# Patient Record
Sex: Female | Born: 1945
Health system: Southern US, Community
[De-identification: ages and names within clinical notes are randomized; demographics above are authoritative.]

## PROBLEM LIST (undated history)

## (undated) DIAGNOSIS — R7303 Prediabetes: Secondary | ICD-10-CM

## (undated) DIAGNOSIS — I10 Essential (primary) hypertension: Secondary | ICD-10-CM

## (undated) DIAGNOSIS — F329 Major depressive disorder, single episode, unspecified: Secondary | ICD-10-CM

## (undated) DIAGNOSIS — F32A Depression, unspecified: Secondary | ICD-10-CM

## (undated) DIAGNOSIS — F419 Anxiety disorder, unspecified: Secondary | ICD-10-CM

## (undated) DIAGNOSIS — C9000 Multiple myeloma not having achieved remission: Secondary | ICD-10-CM

## (undated) DIAGNOSIS — N189 Chronic kidney disease, unspecified: Secondary | ICD-10-CM

## (undated) HISTORY — DX: Multiple myeloma not having achieved remission: C90.00

## (undated) HISTORY — PX: APPENDECTOMY: SHX54

## (undated) HISTORY — PX: ABDOMINAL HYSTERECTOMY: SHX81

---

## 1998-11-07 ENCOUNTER — Other Ambulatory Visit: Admission: RE | Admit: 1998-11-07 | Discharge: 1998-11-07 | Payer: Self-pay | Admitting: *Deleted

## 1998-12-07 ENCOUNTER — Ambulatory Visit (HOSPITAL_COMMUNITY): Admission: RE | Admit: 1998-12-07 | Discharge: 1998-12-07 | Payer: Self-pay | Admitting: Gastroenterology

## 1999-01-23 ENCOUNTER — Inpatient Hospital Stay (HOSPITAL_COMMUNITY): Admission: RE | Admit: 1999-01-23 | Discharge: 1999-01-25 | Payer: Self-pay | Admitting: Obstetrics and Gynecology

## 1999-11-29 ENCOUNTER — Other Ambulatory Visit: Admission: RE | Admit: 1999-11-29 | Discharge: 1999-11-29 | Payer: Self-pay | Admitting: Obstetrics and Gynecology

## 2000-05-31 ENCOUNTER — Encounter: Payer: Self-pay | Admitting: Emergency Medicine

## 2000-05-31 ENCOUNTER — Emergency Department (HOSPITAL_COMMUNITY): Admission: EM | Admit: 2000-05-31 | Discharge: 2000-05-31 | Payer: Self-pay | Admitting: Emergency Medicine

## 2000-06-06 ENCOUNTER — Ambulatory Visit (HOSPITAL_BASED_OUTPATIENT_CLINIC_OR_DEPARTMENT_OTHER): Admission: RE | Admit: 2000-06-06 | Discharge: 2000-06-06 | Payer: Self-pay | Admitting: *Deleted

## 2001-02-04 ENCOUNTER — Other Ambulatory Visit: Admission: RE | Admit: 2001-02-04 | Discharge: 2001-02-04 | Payer: Self-pay | Admitting: Obstetrics and Gynecology

## 2002-11-09 ENCOUNTER — Other Ambulatory Visit: Admission: RE | Admit: 2002-11-09 | Discharge: 2002-11-09 | Payer: Self-pay | Admitting: Obstetrics and Gynecology

## 2003-01-25 ENCOUNTER — Encounter: Admission: RE | Admit: 2003-01-25 | Discharge: 2003-02-09 | Payer: Self-pay | Admitting: Orthopedic Surgery

## 2003-12-28 ENCOUNTER — Other Ambulatory Visit: Admission: RE | Admit: 2003-12-28 | Discharge: 2003-12-28 | Payer: Self-pay | Admitting: Obstetrics and Gynecology

## 2018-04-07 ENCOUNTER — Inpatient Hospital Stay (HOSPITAL_COMMUNITY): Payer: Medicaid Other

## 2018-04-07 ENCOUNTER — Ambulatory Visit (HOSPITAL_COMMUNITY)
Admission: RE | Admit: 2018-04-07 | Discharge: 2018-04-07 | Disposition: A | Discharge status: Home / Self Care | Payer: Medicaid Other | Source: Ambulatory Visit | Attending: Nurse Practitioner | Admitting: Nurse Practitioner

## 2018-04-07 ENCOUNTER — Encounter (HOSPITAL_COMMUNITY): Payer: Self-pay | Admitting: Hematology

## 2018-04-07 ENCOUNTER — Other Ambulatory Visit: Payer: Self-pay

## 2018-04-07 ENCOUNTER — Inpatient Hospital Stay (HOSPITAL_COMMUNITY): Payer: Medicaid Other | Attending: Hematology | Admitting: Hematology

## 2018-04-07 VITALS — Ht 63.0 in | Wt 167.0 lb

## 2018-04-07 DIAGNOSIS — Z5111 Encounter for antineoplastic chemotherapy: Secondary | ICD-10-CM | POA: Diagnosis present

## 2018-04-07 DIAGNOSIS — C9000 Multiple myeloma not having achieved remission: Secondary | ICD-10-CM

## 2018-04-07 DIAGNOSIS — N179 Acute kidney failure, unspecified: Secondary | ICD-10-CM | POA: Diagnosis not present

## 2018-04-07 DIAGNOSIS — D649 Anemia, unspecified: Secondary | ICD-10-CM | POA: Diagnosis not present

## 2018-04-07 DIAGNOSIS — M8580 Other specified disorders of bone density and structure, unspecified site: Secondary | ICD-10-CM | POA: Diagnosis not present

## 2018-04-07 DIAGNOSIS — Z5112 Encounter for antineoplastic immunotherapy: Secondary | ICD-10-CM | POA: Insufficient documentation

## 2018-04-07 DIAGNOSIS — R937 Abnormal findings on diagnostic imaging of other parts of musculoskeletal system: Secondary | ICD-10-CM | POA: Diagnosis not present

## 2018-04-07 LAB — CBC WITH DIFFERENTIAL/PLATELET
Basophils Absolute: 0 10*3/uL (ref 0.0–0.1)
Basophils Relative: 0 %
Eosinophils Absolute: 0.3 10*3/uL (ref 0.0–0.7)
Eosinophils Relative: 5 %
HCT: 28.2 % — ABNORMAL LOW (ref 36.0–46.0)
Hemoglobin: 9.3 g/dL — ABNORMAL LOW (ref 12.0–15.0)
Lymphocytes Relative: 28 %
Lymphs Abs: 1.9 10*3/uL (ref 0.7–4.0)
MCH: 30.3 pg (ref 26.0–34.0)
MCHC: 33 g/dL (ref 30.0–36.0)
MCV: 91.9 fL (ref 78.0–100.0)
Monocytes Absolute: 0.4 10*3/uL (ref 0.1–1.0)
Monocytes Relative: 6 %
Neutro Abs: 4.3 10*3/uL (ref 1.7–7.7)
Neutrophils Relative %: 61 %
Platelets: 202 10*3/uL (ref 150–400)
RBC: 3.07 MIL/uL — ABNORMAL LOW (ref 3.87–5.11)
RDW: 13.5 % (ref 11.5–15.5)
WBC: 7 10*3/uL (ref 4.0–10.5)

## 2018-04-07 LAB — COMPREHENSIVE METABOLIC PANEL
ALT: 16 U/L (ref 0–44)
AST: 18 U/L (ref 15–41)
Albumin: 3.5 g/dL (ref 3.5–5.0)
Alkaline Phosphatase: 74 U/L (ref 38–126)
Anion gap: 9 (ref 5–15)
BUN: 44 mg/dL — ABNORMAL HIGH (ref 8–23)
CO2: 23 mmol/L (ref 22–32)
Calcium: 12.7 mg/dL — ABNORMAL HIGH (ref 8.9–10.3)
Chloride: 103 mmol/L (ref 98–111)
Creatinine, Ser: 2.4 mg/dL — ABNORMAL HIGH (ref 0.44–1.00)
GFR calc Af Amer: 23 mL/min — ABNORMAL LOW (ref >60)
GFR calc non Af Amer: 19 mL/min — ABNORMAL LOW (ref >60)
Glucose, Bld: 128 mg/dL — ABNORMAL HIGH (ref 70–99)
Potassium: 3.9 mmol/L (ref 3.5–5.1)
Sodium: 135 mmol/L (ref 135–145)
Total Bilirubin: 0.5 mg/dL (ref 0.3–1.2)
Total Protein: 11.4 g/dL — ABNORMAL HIGH (ref 6.5–8.1)

## 2018-04-07 LAB — URIC ACID: Uric Acid, Serum: 7 mg/dL (ref 2.5–7.1)

## 2018-04-07 LAB — LACTATE DEHYDROGENASE: LDH: 115 U/L (ref 98–192)

## 2018-04-07 MED ORDER — ZOLPIDEM TARTRATE 10 MG PO TABS: 10.0000 mg | ORAL_TABLET | Freq: Every evening | ORAL | 0 refills | Status: DC | PRN
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
## 2018-04-07 NOTE — Patient Instructions (Signed)
[place] at [facility] Discharge Instructions Follow up in 1 week for results.   Thank you for choosing [place] at [facility] to provide your oncology and hematology care.  To afford each patient quality time with our provider, please arrive at least 15 minutes before your scheduled appointment time.   If you have a lab appointment with the [facility] please come in thru the  Main Entrance and check in at the main information desk  You need to re-schedule your appointment should you arrive 10 or more minutes late.  We strive to give you quality time with our providers, and arriving late affects you and other patients whose appointments are after yours.  Also, if you no show three or more times for appointments you may be dismissed from the clinic at the providers discretion.     Again, thank you for choosing [facility]  Our hope is that these requests will decrease the amount of time that you wait before being seen by our physicians.       _____________________________________________________________  Should you have questions after your visit to [facility], please contact our office at [phone] between the hours of 8:00 a.m. and 4:30 p.m.  Voicemails left after 4:00 p.m. will not be returned until the following business day.  For prescription refill requests, have your pharmacy contact our office and allow 72 hours.    Cancer Center Support Programs:   > Cancer Support Group  2nd Tuesday of the month 1pm-2pm, Journey Room

## 2018-04-07 NOTE — Assessment & Plan Note (Addendum)
1.  IgG lambda plasma cell myeloma: - Presentation to [facility] with left-sided rib pain on 12/29/2017, found to have hypercalcemia, anemia and acute kidney injury, further work-up showed 3.2 g/dL of IgG lambda monoclonal gammopathy. -Skeletal survey reportedly demonstrated diffuse osteopenia and multiple lytic lesions at bilateral lower extremities. -Bone marrow biopsy on 12/31/2017 showed hypercellular marrow with 40 to 50% cellularity, involved by plasma cell neoplasm (17% plasma cells by manual aspirate differential count, greater than 90% and 30 to 40% by CD138 immunohistochemical analysis of clot and core respectively, and monotypic lambda by flow cytometry analysis) - Chromosome analysis showed 12 of 20 cells examined are normal, 46, X6.  The remaining 8 metaphase cells analyzed are abnormal and represent a hyperdiploid clone containing 1 extra copy each of chromosome 3, 5, 6, 7, 9, 11, 15, 19, 20, 21, 22 as well as a marker chromosome. -Multiple myeloma FISH panel shows nuclei positive for three 1q signals, trisomy 11, and extra signals of chromosome 7, 9 and 15. - Patient was recommended to start chemotherapy with port placement by Dr. Federico Flake at [facility].  She was recommended to have IV chemotherapy as she only has Medicare part A. -Patient did not follow-up at [facility]. -She does not report any major new onset pains.  She developed right shoulder pain for the last 4 days.  She does have occasional nausea but denies any vomiting.  She lost about 10 pounds in the last 3 months.  Her only medications include ibuprofen taken for shoulder pain. -I have recommended doing labs including CBC, CMP, SPEP, S FLC, immunofixation, beta-2 microglobulin, LDH and skeletal survey.  I will see her back after that.  We plan to initiate treatment as soon as possible.  She has an appointment to see Dr. Arnoldo Morale this Thursday for port placement.  2.  Difficulty falling and staying asleep: -She had sleeping  problem for many years.  She is taking Tylenol PM which is not helping.  She has taken Xanax and Ambien in the past. -I have prescribed Ambien 10 mg to be taken as needed at bedtime.

## 2018-04-08 ENCOUNTER — Encounter (HOSPITAL_COMMUNITY): Payer: Self-pay | Admitting: General Practice

## 2018-04-08 LAB — PROTEIN ELECTROPHORESIS, SERUM
A/G Ratio: 0.5 — ABNORMAL LOW (ref 0.7–1.7)
Albumin ELP: 3.8 g/dL (ref 2.9–4.4)
Alpha-1-Globulin: 0.3 g/dL (ref 0.0–0.4)
Alpha-2-Globulin: 1 g/dL (ref 0.4–1.0)
Beta Globulin: 1.5 g/dL — ABNORMAL HIGH (ref 0.7–1.3)
Gamma Globulin: 4.5 g/dL — ABNORMAL HIGH (ref 0.4–1.8)
Globulin, Total: 7.3 g/dL — ABNORMAL HIGH (ref 2.2–3.9)
M-Spike, %: 4 g/dL — ABNORMAL HIGH
Total Protein ELP: 11.1 g/dL — ABNORMAL HIGH (ref 6.0–8.5)

## 2018-04-08 LAB — KAPPA/LAMBDA LIGHT CHAINS
Kappa free light chain: 24.1 mg/L — ABNORMAL HIGH (ref 3.3–19.4)
Kappa, lambda light chain ratio: 0 — ABNORMAL LOW (ref 0.26–1.65)
Lambda free light chains: 5966.7 mg/L — ABNORMAL HIGH (ref 5.7–26.3)

## 2018-04-08 LAB — IMMUNOFIXATION ELECTROPHORESIS
IgA: 46 mg/dL — ABNORMAL LOW (ref 87–352)
IgG (Immunoglobin G), Serum: 6141 mg/dL — ABNORMAL HIGH (ref 700–1600)
IgM (Immunoglobulin M), Srm: 13 mg/dL — ABNORMAL LOW (ref 26–217)
Total Protein ELP: 11.1 g/dL — ABNORMAL HIGH (ref 6.0–8.5)

## 2018-04-08 LAB — BETA 2 MICROGLOBULIN, SERUM: BETA 2 MICROGLOBULIN: 12.8 mg/L — AB (ref 0.6–2.4)

## 2018-04-08 NOTE — Progress Notes (Signed)
[facility] CSW Progress Notes  Patient returned call.  CSW reviewed needs - patient lives w son who also can provide transportation.  Is unclear about nature of upcoming appointment for port placement, would like call from nurse navigator to better understand treatment plan.  CSW will contact RN.  Also mailed information packet w community resources available - patient has financial concerns.  Has been given contact information for Financial Advocate Lendell Caprice, encouraged patient to call advocate.  Edwyna Shell, LCSW Clinical Social Worker Phone:  [phone]

## 2018-04-08 NOTE — Progress Notes (Signed)
[place] Psychosocial Distress Screening Clinical Social Work  Clinical Social Work was referred by distress screening protocol.  The patient scored a 7 on the Psychosocial Distress Thermometer which indicates moderate distress. Clinical Social Worker contacted patient by phone to assess for distress and other psychosocial needs. Unable to reach patient or leave VM, no answer and VM not set up.  CSW will recontact at later date.    ONCBCN DISTRESS SCREENING 04/07/2018  Screening Type Initial Screening  Distress experienced in past week (1-10) 7  Practical problem type Insurance  Family Problem type Children  Emotional problem type Adjusting to illness;Isolation/feeling alone  Spiritual/Religous concerns type Loss of sense of purpose  Physical Problem type Pain;Nausea/vomiting;Sleep/insomnia;Getting around;Bathing/dressing    Clinical Social Worker follow up needed: No.  If yes, follow up plan:  Try second contact attempt at later date.    Beverely Pace, [place], LCSW Clinical Social Worker Phone:  [phone]

## 2018-04-10 ENCOUNTER — Encounter: Payer: Self-pay | Admitting: General Surgery

## 2018-04-10 ENCOUNTER — Encounter (HOSPITAL_COMMUNITY): Payer: Self-pay | Admitting: Emergency Medicine

## 2018-04-10 ENCOUNTER — Emergency Department (HOSPITAL_COMMUNITY)
Admission: EM | Admit: 2018-04-10 | Discharge: 2018-04-10 | Disposition: A | Discharge status: Home / Self Care | Payer: Medicaid Other | Attending: Emergency Medicine | Admitting: Emergency Medicine

## 2018-04-10 ENCOUNTER — Other Ambulatory Visit: Payer: Self-pay

## 2018-04-10 ENCOUNTER — Ambulatory Visit (INDEPENDENT_AMBULATORY_CARE_PROVIDER_SITE_OTHER): Payer: Medicare Other | Admitting: General Surgery

## 2018-04-10 VITALS — BP 178/100 | HR 107 | Temp 98.2°F | Resp 18 | Wt 168.0 lb

## 2018-04-10 DIAGNOSIS — C9000 Multiple myeloma not having achieved remission: Secondary | ICD-10-CM

## 2018-04-10 DIAGNOSIS — I10 Essential (primary) hypertension: Secondary | ICD-10-CM | POA: Insufficient documentation

## 2018-04-10 DIAGNOSIS — E859 Amyloidosis, unspecified: Secondary | ICD-10-CM

## 2018-04-10 MED ORDER — CLONIDINE HCL 0.1 MG PO TABS
0.1000 mg | ORAL_TABLET | Freq: Once | ORAL | Status: AC
  Administered 2018-04-10: 0.1 mg via ORAL
  Filled 2018-04-10: qty 1

## 2018-04-10 MED ORDER — LISINOPRIL 10 MG PO TABS: 10.0000 mg | ORAL_TABLET | Freq: Every day | ORAL | 1 refills | Status: DC

## 2018-04-10 NOTE — Discharge Instructions (Addendum)
We will start a new blood pressure medication.  You will need primary care follow-up.  It is your responsibility to find a doctor in the community.

## 2018-04-10 NOTE — Patient Instructions (Signed)
Implanted Port Insertion Implanted port insertion is a procedure to put in a port and catheter. The port is a device with an injectable disk that can be accessed by your health care provider. The port is connected to a vein in the chest or neck by a small flexible tube (catheter). There are different types of ports. The implanted port may be used as a long-term IV access for:  Medicines, such as chemotherapy.  Fluids.  Liquid nutrition, such as total parenteral nutrition (TPN).  Blood samples.  Having a port means that your health care provider will not need to use the veins in your arms for these procedures. Tell a health care provider about:  Any allergies you have.  All medicines you are taking, especially blood thinners, as well as any vitamins, herbs, eye drops, creams, over-the-counter medicines, and steroids.  Any problems you or family members have had with anesthetic medicines.  Any blood disorders you have.  Any surgeries you have had.  Any medical conditions you have, including diabetes or kidney problems.  Whether you are pregnant or may be pregnant. What are the risks? Generally, this is a safe procedure. However, problems may occur, including:  Allergic reactions to medicines or dyes.  Damage to other structures or organs.  Infection.  Damage to the blood vessel, bruising, or bleeding at the puncture site.  Blood clot.  Breakdown of the skin over the port.  A collection of air in the chest that can cause one of the lungs to collapse (pneumothorax). This is rare.  What happens before the procedure? Staying hydrated Follow instructions from your health care provider about hydration, which may include:  Up to 2 hours before the procedure - you may continue to drink clear liquids, such as water, clear fruit juice, black coffee, and plain tea.  Eating and drinking restrictions  Follow instructions from your health care provider about eating and drinking,  which may include: ? 8 hours before the procedure - stop eating heavy meals or foods such as meat, fried foods, or fatty foods. ? 6 hours before the procedure - stop eating light meals or foods, such as toast or cereal. ? 6 hours before the procedure - stop drinking milk or drinks that contain milk. ? 2 hours before the procedure - stop drinking clear liquids. Medicines  Ask your health care provider about: ? Changing or stopping your regular medicines. This is especially important if you are taking diabetes medicines or blood thinners. ? Taking medicines such as aspirin and ibuprofen. These medicines can thin your blood. Do not take these medicines before your procedure if your health care provider instructs you not to.  You may be given antibiotic medicine to help prevent infection. General instructions  Plan to have someone take you home from the hospital or clinic.  If you will be going home right after the procedure, plan to have someone with you for 24 hours.  You may have blood tests.  You may be asked to shower with a germ-killing soap. What happens during the procedure?  To lower your risk of infection: ? Your health care team will wash or sanitize their hands. ? Your skin will be washed with soap. ? Hair may be removed from the surgical area.  An IV tube will be inserted into one of your veins.  You will be given one or more of the following: ? A medicine to help you relax (sedative). ? A medicine to numb the area (local anesthetic).    Two small cuts (incisions) will be made to insert the port. ? One incision will be made in your neck to get access to the vein where the catheter will lie. ? The other incision will be made in the upper chest. This is where the port will lie.  The procedure may be done using continuous X-ray (fluoroscopy) or other imaging tools for guidance.  The port and catheter will be placed. There may be a small, raised area where the port  is.  The port will be flushed with a salt solution (saline), and blood will be drawn to make sure that it is working correctly.  The incisions will be closed.  Bandages (dressings) may be placed over the incisions. The procedure may vary among health care providers and hospitals. What happens after the procedure?  Your blood pressure, heart rate, breathing rate, and blood oxygen level will be monitored until the medicines you were given have worn off.  Do not drive for 24 hours if you were given a sedative.  You will be given a manufacturer's information card for the type of port that you have. Keep this with you.  Your port will need to be flushed and checked as told by your health care provider, usually every few weeks.  A chest X-ray will be done to: ? Check the placement of the port. ? Make sure there is no injury to your lung. Summary  Implanted port insertion is a procedure to put in a port and catheter.  The implanted port is used as a long-term IV access.  The port will need to be flushed and checked as told by your health care provider, usually every few weeks.  Keep your manufacturer's information card with you at all times. This information is not intended to replace advice given to you by your health care provider. Make sure you discuss any questions you have with your health care provider. Document Released: 05/27/2013 Document Revised: 06/27/2016 Document Reviewed: 06/27/2016 Elsevier Interactive Patient Education  2017 [employer] Implanted Port Home Guide An implanted port is a type of central line that is placed under the skin. Central lines are used to provide IV access when treatment or nutrition needs to be given through a person's veins. Implanted ports are used for long-term IV access. An implanted port may be placed because:  You need IV medicine that would be irritating to the small veins in your hands or arms.  You need long-term IV medicines, such as  antibiotics.  You need IV nutrition for a long period.  You need frequent blood draws for lab tests.  You need dialysis.  Implanted ports are usually placed in the chest area, but they can also be placed in the upper arm, the abdomen, or the leg. An implanted port has two main parts:  Reservoir. The reservoir is round and will appear as a small, raised area under your skin. The reservoir is the part where a needle is inserted to give medicines or draw blood.  Catheter. The catheter is a thin, flexible tube that extends from the reservoir. The catheter is placed into a large vein. Medicine that is inserted into the reservoir goes into the catheter and then into the vein.  How will I care for my incision site? Do not get the incision site wet. Bathe or shower as directed by your health care provider. How is my port accessed? Special steps must be taken to access the port:  Before the port is accessed,   a numbing cream can be placed on the skin. This helps numb the skin over the port site.  Your health care provider uses a sterile technique to access the port. ? Your health care provider must put on a mask and sterile gloves. ? The skin over your port is cleaned carefully with an antiseptic and allowed to dry. ? The port is gently pinched between sterile gloves, and a needle is inserted into the port.  Only "non-coring" port needles should be used to access the port. Once the port is accessed, a blood return should be checked. This helps ensure that the port is in the vein and is not clogged.  If your port needs to remain accessed for a constant infusion, a clear (transparent) bandage will be placed over the needle site. The bandage and needle will need to be changed every week, or as directed by your health care provider.  Keep the bandage covering the needle clean and dry. Do not get it wet. Follow your health care provider's instructions on how to take a shower or bath while the port is  accessed.  If your port does not need to stay accessed, no bandage is needed over the port.  What is flushing? Flushing helps keep the port from getting clogged. Follow your health care provider's instructions on how and when to flush the port. Ports are usually flushed with saline solution or a medicine called heparin. The need for flushing will depend on how the port is used.  If the port is used for intermittent medicines or blood draws, the port will need to be flushed: ? After medicines have been given. ? After blood has been drawn. ? As part of routine maintenance.  If a constant infusion is running, the port may not need to be flushed.  How long will my port stay implanted? The port can stay in for as long as your health care provider thinks it is needed. When it is time for the port to come out, surgery will be done to remove it. The procedure is similar to the one performed when the port was put in. When should I seek immediate medical care? When you have an implanted port, you should seek immediate medical care if:  You notice a bad smell coming from the incision site.  You have swelling, redness, or drainage at the incision site.  You have more swelling or pain at the port site or the surrounding area.  You have a fever that is not controlled with medicine.  This information is not intended to replace advice given to you by your health care provider. Make sure you discuss any questions you have with your health care provider. Document Released: 08/06/2005 Document Revised: 01/12/2016 Document Reviewed: 04/13/2013 Elsevier Interactive Patient Education  2017 [employer] Inc[employer]  

## 2018-04-10 NOTE — ED Triage Notes (Addendum)
Pt C/O hypertension and shaking. Pt denies any visual changes or weakness. Pt denies pain. Pt was seen at PCP this morning. Per pt she does not take medication for high BP.

## 2018-04-10 NOTE — H&P (Signed)
Alexandra Price; [id number]; 05/26/1946   HPI Patient is a 72-year-old white female who was referred to my care by Dr. Katragadda of oncology for Port-A-Cath placement.  Patient has multiple myeloma and is about to undergo chemotherapy.  She has 6 out of 10 pain throughout her body.  She denies any recent fever or chills. Past Medical History:  Diagnosis Date  . Multiple myeloma (HCC)     Past Surgical History:  Procedure Laterality Date  . ABDOMINAL HYSTERECTOMY     total    Family History  Problem Relation Age of Onset  . Heart disease Mother   . Emphysema Father   . Diabetes Sister   . Cancer Brother        liver, lung, and colon  . Diabetes Brother     Current Outpatient Medications on File Prior to Visit  Medication Sig Dispense Refill  . ibuprofen (ADVIL,MOTRIN) 200 MG tablet Take 400 mg by mouth every 6 (six) hours as needed.    . Ibuprofen-diphenhydrAMINE Cit (MOTRIN PM) 200-38 MG TABS Take 2 tablets by mouth at bedtime.    . zolpidem (AMBIEN) 10 MG tablet Take 1 tablet (10 mg total) by mouth at bedtime as needed for sleep. 30 tablet 0   No current facility-administered medications on file prior to visit.     Allergies  Allergen Reactions  . Morphine And Related Nausea And Vomiting    Social History   Substance and Sexual Activity  Alcohol Use Never  . Frequency: Never    Social History   Tobacco Use  Smoking Status Never Smoker  Smokeless Tobacco Never Used    Review of Systems  Constitutional: Positive for malaise/fatigue.  HENT: Negative.   Eyes: Negative.   Respiratory: Negative.   Cardiovascular: Negative.   Gastrointestinal: Positive for heartburn.  Genitourinary: Negative.   Musculoskeletal: Positive for joint pain.  Skin: Negative.   Neurological: Negative.   Endo/Heme/Allergies: Negative.   Psychiatric/Behavioral: Negative.     Objective   Vitals:   04/10/18 0903 04/10/18 0905  BP: (!) 181/101 (!) 178/100  Pulse: (!) 107    Resp: 18   Temp: 98.2 F (36.8 C)     Physical Exam  Constitutional: She is oriented to person, place, and time. She appears well-developed and well-nourished. No distress.  HENT:  Head: Normocephalic and atraumatic.  Cardiovascular: Normal rate, regular rhythm and normal heart sounds. Exam reveals no gallop and no friction rub.  No murmur heard. Pulmonary/Chest: Effort normal and breath sounds normal. No stridor. No respiratory distress. She has no wheezes. She has no rales.  Neurological: She is alert and oriented to person, place, and time.  Skin: Skin is warm and dry.  Vitals reviewed. Dr. Katragadda's notes reviewed  Assessment  Multiple myeloma, need for central venous access Plan   Patient is scheduled for Port-A-Cath insertion on 04/14/2018.  The risks and benefits of the procedure including bleeding, infection, and pneumothorax were fully explained to the patient, who gave informed consent. 

## 2018-04-10 NOTE — Progress Notes (Signed)
Alexandra Price; [id number]; 08/21/1945   HPI Patient is a 71-year-old white female who was referred to my care by Dr. Katragadda of oncology for Port-A-Cath placement.  Patient has multiple myeloma and is about to undergo chemotherapy.  She has 6 out of 10 pain throughout her body.  She denies any recent fever or chills. Past Medical History:  Diagnosis Date  . Multiple myeloma (HCC)     Past Surgical History:  Procedure Laterality Date  . ABDOMINAL HYSTERECTOMY     total    Family History  Problem Relation Age of Onset  . Heart disease Mother   . Emphysema Father   . Diabetes Sister   . Cancer Brother        liver, lung, and colon  . Diabetes Brother     Current Outpatient Medications on File Prior to Visit  Medication Sig Dispense Refill  . ibuprofen (ADVIL,MOTRIN) 200 MG tablet Take 400 mg by mouth every 6 (six) hours as needed.    . Ibuprofen-diphenhydrAMINE Cit (MOTRIN PM) 200-38 MG TABS Take 2 tablets by mouth at bedtime.    . zolpidem (AMBIEN) 10 MG tablet Take 1 tablet (10 mg total) by mouth at bedtime as needed for sleep. 30 tablet 0   No current facility-administered medications on file prior to visit.     Allergies  Allergen Reactions  . Morphine And Related Nausea And Vomiting    Social History   Substance and Sexual Activity  Alcohol Use Never  . Frequency: Never    Social History   Tobacco Use  Smoking Status Never Smoker  Smokeless Tobacco Never Used    Review of Systems  Constitutional: Positive for malaise/fatigue.  HENT: Negative.   Eyes: Negative.   Respiratory: Negative.   Cardiovascular: Negative.   Gastrointestinal: Positive for heartburn.  Genitourinary: Negative.   Musculoskeletal: Positive for joint pain.  Skin: Negative.   Neurological: Negative.   Endo/Heme/Allergies: Negative.   Psychiatric/Behavioral: Negative.     Objective   Vitals:   04/10/18 0903 04/10/18 0905  BP: (!) 181/101 (!) 178/100  Pulse: (!) 107    Resp: 18   Temp: 98.2 F (36.8 C)     Physical Exam  Constitutional: She is oriented to person, place, and time. She appears well-developed and well-nourished. No distress.  HENT:  Head: Normocephalic and atraumatic.  Cardiovascular: Normal rate, regular rhythm and normal heart sounds. Exam reveals no gallop and no friction rub.  No murmur heard. Pulmonary/Chest: Effort normal and breath sounds normal. No stridor. No respiratory distress. She has no wheezes. She has no rales.  Neurological: She is alert and oriented to person, place, and time.  Skin: Skin is warm and dry.  Vitals reviewed. Dr. Katragadda's notes reviewed  Assessment  Multiple myeloma, need for central venous access Plan   Patient is scheduled for Port-A-Cath insertion on 04/14/2018.  The risks and benefits of the procedure including bleeding, infection, and pneumothorax were fully explained to the patient, who gave informed consent. 

## 2018-04-10 NOTE — ED Provider Notes (Signed)
[facility] EMERGENCY DEPARTMENT Provider Note   CSN: [id number] Arrival date & time: 04/10/18  2129     History   Chief Complaint Chief Complaint  Patient presents with  . Hypertension    HPI Alexandra Price is a 72 y.o. female.  Patient noted an elevated blood pressure this morning.  She stated 197/102, 176/107, 187/107.  She had an appointment this morning with her general surgeon for a procedure Monday (central line placement).  Past medical history includes multiple myeloma.  She has had borderline hypertension in the past but no medication.  She feels shaky and blames this on anxiety.  No chest pain, dyspnea, neurological deficits, visual changes, facial asymmetry, extremity weakness.  Nothing makes symptoms better or worse.     Past Medical History:  Diagnosis Date  . Multiple myeloma [facility])     Patient Active Problem List   Diagnosis Date Noted  . Multiple myeloma without remission ([place]) 04/07/2018    Past Surgical History:  Procedure Laterality Date  . ABDOMINAL HYSTERECTOMY     total     OB History   None      Home Medications    Prior to Admission medications   Medication Sig Start Date End Date Taking? Authorizing Provider  ibuprofen (ADVIL,MOTRIN) 200 MG tablet Take 400 mg by mouth every 6 (six) hours as needed for headache or moderate pain.    Yes [provider]  trolamine salicylate (ASPERCREME) 10 % cream Apply 1 application topically daily as needed for muscle pain.   Yes [provider]  zolpidem (AMBIEN) 10 MG tablet Take 1 tablet (10 mg total) by mouth at bedtime as needed for sleep. Patient taking differently: Take 10 mg by mouth at bedtime.  04/07/18 05/07/18 Yes Lockamy, Randi L, NP-C  lisinopril (PRINIVIL,ZESTRIL) 10 MG tablet Take 1 tablet (10 mg total) by mouth daily. 04/10/18   Nat Christen, MD    Family History Family History  Problem Relation Age of Onset  . Heart disease Mother   . Emphysema Father   . Diabetes  Sister   . Cancer Brother        liver, lung, and colon  . Diabetes Brother     Social History Social History   Tobacco Use  . Smoking status: Never Smoker  . Smokeless tobacco: Never Used  Substance Use Topics  . Alcohol use: Never    Frequency: Never  . Drug use: Not Currently     Allergies   Morphine and related   Review of Systems Review of Systems  All other systems reviewed and are negative.    Physical Exam Updated Vital Signs BP (!) 154/75 (BP Location: Right Arm)   Pulse 74   Temp 98.1 F (36.7 C) (Oral)   Resp 18   Ht 5' 3" (1.6 m)   Wt 76.2 kg   SpO2 96%   BMI 29.76 kg/m   Physical Exam  Constitutional: She is oriented to person, place, and time. She appears well-developed and well-nourished.  HENT:  Head: Normocephalic and atraumatic.  Eyes: Conjunctivae are normal.  Neck: Neck supple.  Cardiovascular: Normal rate and regular rhythm.  Pulmonary/Chest: Effort normal and breath sounds normal.  Abdominal: Soft. Bowel sounds are normal.  Musculoskeletal: Normal range of motion.  Neurological: She is alert and oriented to person, place, and time.  Skin: Skin is warm and dry.  Psychiatric: She has a normal mood and affect. Her behavior is normal.  Nursing note and vitals reviewed.  ED Treatments / Results  Labs (all labs ordered are listed, but only abnormal results are displayed) Labs Reviewed - No data to display  EKG None  Radiology No results found.  Procedures Procedures (including critical care time)  Medications Ordered in ED Medications  cloNIDine (CATAPRES) tablet 0.1 mg (0.1 mg Oral Given 04/10/18 2214)     Initial Impression / Assessment and Plan / ED Course  I have reviewed the triage vital signs and the nursing notes.  Pertinent labs & imaging results that were available during my care of the patient were reviewed by me and considered in my medical decision making (see chart for details).     Patient presents  with elevated blood pressure.  Otherwise, her physical exam was normal.  She responded well to clonidine 0.1 mg.  Patient states she has had "borderline hypertension" in the past.  I suspect she is truly hypertensive.  Will begin Rx lisinopril 10 mg daily.  Discussed with patient and her son.  Final Clinical Impressions(s) / ED Diagnoses   Final diagnoses:  Hypertension, unspecified type    ED Discharge Orders         Ordered    lisinopril (PRINIVIL,ZESTRIL) 10 MG tablet  Daily,   Status:  Discontinued     04/10/18 2307    lisinopril (PRINIVIL,ZESTRIL) 10 MG tablet  Daily     04/10/18 2309           Nat Christen, MD 04/10/18 2314

## 2018-04-11 ENCOUNTER — Encounter (HOSPITAL_COMMUNITY)
Admission: RE | Admit: 2018-04-11 | Discharge: 2018-04-11 | Disposition: A | Discharge status: Home / Self Care | Payer: Self-pay | Source: Ambulatory Visit | Attending: General Surgery | Admitting: General Surgery

## 2018-04-11 ENCOUNTER — Encounter (HOSPITAL_COMMUNITY): Payer: Self-pay

## 2018-04-14 ENCOUNTER — Ambulatory Visit (HOSPITAL_COMMUNITY): Payer: Medicaid Other

## 2018-04-14 ENCOUNTER — Encounter (HOSPITAL_COMMUNITY)
Admission: RE | Disposition: A | Discharge status: Home / Self Care | Payer: Self-pay | Source: Ambulatory Visit | Attending: General Surgery

## 2018-04-14 ENCOUNTER — Ambulatory Visit (HOSPITAL_COMMUNITY): Payer: Medicaid Other | Admitting: Anesthesiology

## 2018-04-14 ENCOUNTER — Ambulatory Visit (HOSPITAL_COMMUNITY)
Admission: RE | Admit: 2018-04-14 | Discharge: 2018-04-14 | Disposition: A | Discharge status: Home / Self Care | Payer: Medicaid Other | Source: Ambulatory Visit | Attending: General Surgery | Admitting: General Surgery

## 2018-04-14 ENCOUNTER — Other Ambulatory Visit: Payer: Self-pay

## 2018-04-14 DIAGNOSIS — Z794 Long term (current) use of insulin: Secondary | ICD-10-CM | POA: Diagnosis not present

## 2018-04-14 DIAGNOSIS — Z09 Encounter for follow-up examination after completed treatment for conditions other than malignant neoplasm: Secondary | ICD-10-CM

## 2018-04-14 DIAGNOSIS — C9000 Multiple myeloma not having achieved remission: Secondary | ICD-10-CM | POA: Diagnosis present

## 2018-04-14 DIAGNOSIS — Z95828 Presence of other vascular implants and grafts: Secondary | ICD-10-CM

## 2018-04-14 DIAGNOSIS — Z79899 Other long term (current) drug therapy: Secondary | ICD-10-CM | POA: Diagnosis not present

## 2018-04-14 HISTORY — PX: PORTACATH PLACEMENT: SHX2246

## 2018-04-14 SURGERY — INSERTION, TUNNELED CENTRAL VENOUS DEVICE, WITH PORT
Anesthesia: Monitor Anesthesia Care | Site: Chest | Laterality: Right

## 2018-04-14 MED ORDER — LIDOCAINE HCL (PF) 1 % IJ SOLN
INTRAMUSCULAR | Status: DC | PRN
  Administered 2018-04-14: 7 mL

## 2018-04-14 MED ORDER — ONDANSETRON HCL 4 MG/2ML IJ SOLN
4.0000 mg | Freq: Once | INTRAMUSCULAR | Status: AC
  Administered 2018-04-14: 4 mg via INTRAVENOUS

## 2018-04-14 MED ORDER — KETOROLAC TROMETHAMINE 30 MG/ML IJ SOLN
INTRAMUSCULAR | Status: AC
  Filled 2018-04-14: qty 1

## 2018-04-14 MED ORDER — SUCCINYLCHOLINE CHLORIDE 20 MG/ML IJ SOLN
INTRAMUSCULAR | Status: AC
  Filled 2018-04-14: qty 1

## 2018-04-14 MED ORDER — MIDAZOLAM HCL 5 MG/5ML IJ SOLN
INTRAMUSCULAR | Status: DC | PRN
  Administered 2018-04-14: 2 mg via INTRAVENOUS

## 2018-04-14 MED ORDER — CEFAZOLIN SODIUM-DEXTROSE 2-4 GM/100ML-% IV SOLN
2.0000 g | INTRAVENOUS | Status: AC
  Administered 2018-04-14: 2 g via INTRAVENOUS
  Filled 2018-04-14: qty 100

## 2018-04-14 MED ORDER — SUGAMMADEX SODIUM 200 MG/2ML IV SOLN
INTRAVENOUS | Status: AC
  Filled 2018-04-14: qty 2

## 2018-04-14 MED ORDER — LIDOCAINE HCL (PF) 1 % IJ SOLN
INTRAMUSCULAR | Status: AC
  Filled 2018-04-14: qty 30

## 2018-04-14 MED ORDER — KETOROLAC TROMETHAMINE 30 MG/ML IJ SOLN
30.0000 mg | Freq: Once | INTRAMUSCULAR | Status: AC
  Administered 2018-04-14: 30 mg via INTRAVENOUS

## 2018-04-14 MED ORDER — CHLORHEXIDINE GLUCONATE CLOTH 2 % EX PADS: 6.0000 | MEDICATED_PAD | Freq: Once | CUTANEOUS | Status: DC

## 2018-04-14 MED ORDER — PROPOFOL 500 MG/50ML IV EMUL
INTRAVENOUS | Status: DC | PRN
  Administered 2018-04-14: 45 ug/kg/min via INTRAVENOUS

## 2018-04-14 MED ORDER — MIDAZOLAM HCL 2 MG/2ML IJ SOLN
INTRAMUSCULAR | Status: AC
  Filled 2018-04-14: qty 2

## 2018-04-14 MED ORDER — ATROPINE SULFATE 0.4 MG/ML IJ SOLN
INTRAMUSCULAR | Status: AC
  Filled 2018-04-14: qty 1

## 2018-04-14 MED ORDER — TRAMADOL HCL 50 MG PO TABS: 50.0000 mg | ORAL_TABLET | Freq: Four times a day (QID) | ORAL | 0 refills | Status: DC | PRN

## 2018-04-14 MED ORDER — FENTANYL CITRATE (PF) 100 MCG/2ML IJ SOLN
INTRAMUSCULAR | Status: DC | PRN
  Administered 2018-04-14: 25 ug via INTRAVENOUS

## 2018-04-14 MED ORDER — PROPOFOL 10 MG/ML IV BOLUS
INTRAVENOUS | Status: AC
  Filled 2018-04-14: qty 20

## 2018-04-14 MED ORDER — HEPARIN SOD (PORK) LOCK FLUSH 100 UNIT/ML IV SOLN
INTRAVENOUS | Status: AC
  Filled 2018-04-14: qty 5

## 2018-04-14 MED ORDER — LACTATED RINGERS IV SOLN
INTRAVENOUS | Status: DC | PRN
  Administered 2018-04-14: 09:00:00 via INTRAVENOUS

## 2018-04-14 MED ORDER — FENTANYL CITRATE (PF) 100 MCG/2ML IJ SOLN: 25.0000 ug | INTRAMUSCULAR | Status: DC | PRN

## 2018-04-14 MED ORDER — SODIUM CHLORIDE 0.9 % IV SOLN
INTRAVENOUS | Status: AC | PRN
  Administered 2018-04-14: 1 mL via INTRAMUSCULAR

## 2018-04-14 MED ORDER — HEPARIN SOD (PORK) LOCK FLUSH 100 UNIT/ML IV SOLN
INTRAVENOUS | Status: DC | PRN
  Administered 2018-04-14: 500 [IU] via INTRAVENOUS

## 2018-04-14 MED ORDER — ONDANSETRON HCL 4 MG/2ML IJ SOLN
INTRAMUSCULAR | Status: AC
  Filled 2018-04-14: qty 2

## 2018-04-14 SURGICAL SUPPLY — 30 items
ADH SKN CLS APL DERMABOND .7 (GAUZE/BANDAGES/DRESSINGS) ×1
BAG DECANTER FOR FLEXI CONT (MISCELLANEOUS) ×2 IMPLANT
CHLORAPREP W/TINT 10.5 ML (MISCELLANEOUS) ×2 IMPLANT
CLOTH BEACON ORANGE TIMEOUT ST (SAFETY) ×2 IMPLANT
COVER LIGHT HANDLE STERIS (MISCELLANEOUS) ×4 IMPLANT
DECANTER SPIKE VIAL GLASS SM (MISCELLANEOUS) ×2 IMPLANT
DERMABOND ADVANCED (GAUZE/BANDAGES/DRESSINGS) ×1
DERMABOND ADVANCED .7 DNX12 (GAUZE/BANDAGES/DRESSINGS) ×1 IMPLANT
DRAPE C-ARM FOLDED MOBILE STRL (DRAPES) ×2 IMPLANT
ELECT REM PT RETURN 9FT ADLT (ELECTROSURGICAL) ×2
ELECTRODE REM PT RTRN 9FT ADLT (ELECTROSURGICAL) ×1 IMPLANT
GLOVE BIOGEL PI IND STRL 7.0 (GLOVE) ×2 IMPLANT
GLOVE BIOGEL PI INDICATOR 7.0 (GLOVE) ×2
GLOVE SURG SS PI 7.5 STRL IVOR (GLOVE) ×2 IMPLANT
GOWN STRL REUS W/TWL LRG LVL3 (GOWN DISPOSABLE) ×4 IMPLANT
IV NS 500ML (IV SOLUTION) ×2
IV NS 500ML BAXH (IV SOLUTION) ×1 IMPLANT
KIT PORT POWER 8FR ISP MRI (Port) ×2 IMPLANT
KIT TURNOVER KIT A (KITS) ×2 IMPLANT
NDL HYPO 25X1 1.5 SAFETY (NEEDLE) ×1 IMPLANT
NEEDLE HYPO 25X1 1.5 SAFETY (NEEDLE) ×2 IMPLANT
PACK MINOR (CUSTOM PROCEDURE TRAY) ×2 IMPLANT
PAD ARMBOARD 7.5X6 YLW CONV (MISCELLANEOUS) ×2 IMPLANT
SET BASIN LINEN APH (SET/KITS/TRAYS/PACK) ×2 IMPLANT
SUT MNCRL AB 4-0 PS2 18 (SUTURE) ×2 IMPLANT
SUT VIC AB 3-0 SH 27 (SUTURE) ×2
SUT VIC AB 3-0 SH 27X BRD (SUTURE) ×1 IMPLANT
SYR 20CC LL (SYRINGE) ×2 IMPLANT
SYR 5ML LL (SYRINGE) ×2 IMPLANT
SYR CONTROL 10ML LL (SYRINGE) ×2 IMPLANT

## 2018-04-14 NOTE — Discharge Instructions (Signed)
Implanted Port Home Guide °An implanted port is a type of central line that is placed under the skin. Central lines are used to provide IV access when treatment or nutrition needs to be given through a person’s veins. Implanted ports are used for long-term IV access. An implanted port may be placed because: °· You need IV medicine that would be irritating to the small veins in your hands or arms. °· You need long-term IV medicines, such as antibiotics. °· You need IV nutrition for a long period. °· You need frequent blood draws for lab tests. °· You need dialysis. ° °Implanted ports are usually placed in the chest area, but they can also be placed in the upper arm, the abdomen, or the leg. An implanted port has two main parts: °· Reservoir. The reservoir is round and will appear as a small, raised area under your skin. The reservoir is the part where a needle is inserted to give medicines or draw blood. °· Catheter. The catheter is a thin, flexible tube that extends from the reservoir. The catheter is placed into a large vein. Medicine that is inserted into the reservoir goes into the catheter and then into the vein. ° °How will I care for my incision site? °Do not get the incision site wet. Bathe or shower as directed by your health care provider. °How is my port accessed? °Special steps must be taken to access the port: °· Before the port is accessed, a numbing cream can be placed on the skin. This helps numb the skin over the port site. °· Your health care provider uses a sterile technique to access the port. °? Your health care provider must put on a mask and sterile gloves. °? The skin over your port is cleaned carefully with an antiseptic and allowed to dry. °? The port is gently pinched between sterile gloves, and a needle is inserted into the port. °· Only "non-coring" port needles should be used to access the port. Once the port is accessed, a blood return should be checked. This helps ensure that the port  is in the vein and is not clogged. °· If your port needs to remain accessed for a constant infusion, a clear (transparent) bandage will be placed over the needle site. The bandage and needle will need to be changed every week, or as directed by your health care provider. °· Keep the bandage covering the needle clean and dry. Do not get it wet. Follow your health care provider’s instructions on how to take a shower or bath while the port is accessed. °· If your port does not need to stay accessed, no bandage is needed over the port. ° °What is flushing? °Flushing helps keep the port from getting clogged. Follow your health care provider’s instructions on how and when to flush the port. Ports are usually flushed with saline solution or a medicine called heparin. The need for flushing will depend on how the port is used. °· If the port is used for intermittent medicines or blood draws, the port will need to be flushed: °? After medicines have been given. °? After blood has been drawn. °? As part of routine maintenance. °· If a constant infusion is running, the port may not need to be flushed. ° °How long will my port stay implanted? °The port can stay in for as long as your health care provider thinks it is needed. When it is time for the port to come out, surgery will be   done to remove it. The procedure is similar to the one performed when the port was put in. °When should I seek immediate medical care? °When you have an implanted port, you should seek immediate medical care if: °· You notice a bad smell coming from the incision site. °· You have swelling, redness, or drainage at the incision site. °· You have more swelling or pain at the port site or the surrounding area. °· You have a fever that is not controlled with medicine. ° °This information is not intended to replace advice given to you by your health care provider. Make sure you discuss any questions you have with your health care provider. °Document  Released: 08/06/2005 Document Revised: 01/12/2016 Document Reviewed: 04/13/2013 °Elsevier Interactive Patient Education © 2017 [employer] °Implanted Port Insertion, Care After °This sheet gives you information about how to care for yourself after your procedure. Your health care provider may also give you more specific instructions. If you have problems or questions, contact your health care provider. °What can I expect after the procedure? °After your procedure, it is common to have: °· Discomfort at the port insertion site. °· Bruising on the skin over the port. This should improve over 3-4 days. ° °Follow these instructions at home: °Port care °· After your port is placed, you will get a manufacturer's information card. The card has information about your port. Keep this card with you at all times. °· Take care of the port as told by your health care provider. Ask your health care provider if you or a family member can get training for taking care of the port at home. A home health care nurse may also take care of the port. °· Make sure to remember what type of port you have. °Incision care °· Follow instructions from your health care provider about how to take care of your port insertion site. Make sure you: °? Wash your hands with soap and water before you change your bandage (dressing). If soap and water are not available, use hand sanitizer. °? Change your dressing as told by your health care provider. °? Leave stitches (sutures), skin glue, or adhesive strips in place. These skin closures may need to stay in place for 2 weeks or longer. If adhesive strip edges start to loosen and curl up, you may trim the loose edges. Do not remove adhesive strips completely unless your health care provider tells you to do that. °· Check your port insertion site every day for signs of infection. Check for: °? More redness, swelling, or pain. °? More fluid or blood. °? Warmth. °? Pus or a bad smell. °General  instructions °· Do not take baths, swim, or use a hot tub until your health care provider approves. °· Do not lift anything that is heavier than 10 lb (4.5 kg) for a week, or as told by your health care provider. °· Ask your health care provider when it is okay to: °? Return to work or school. °? Resume usual physical activities or sports. °· Do not drive for 24 hours if you were given a medicine to help you relax (sedative). °· Take over-the-counter and prescription medicines only as told by your health care provider. °· Wear a medical alert bracelet in case of an emergency. This will tell any health care providers that you have a port. °· Keep all follow-up visits as told by your health care provider. This is important. °Contact a health care provider if: °· You cannot   flush your port with saline as directed, or you cannot draw blood from the port. °· You have a fever or chills. °· You have more redness, swelling, or pain around your port insertion site. °· You have more fluid or blood coming from your port insertion site. °· Your port insertion site feels warm to the touch. °· You have pus or a bad smell coming from the port insertion site. °Get help right away if: °· You have chest pain or shortness of breath. °· You have bleeding from your port that you cannot control. °Summary °· Take care of the port as told by your health care provider. °· Change your dressing as told by your health care provider. °· Keep all follow-up visits as told by your health care provider. °This information is not intended to replace advice given to you by your health care provider. Make sure you discuss any questions you have with your health care provider. °Document Released: 05/27/2013 Document Revised: 06/27/2016 Document Reviewed: 06/27/2016 °Elsevier Interactive Patient Education © 2017 [employer] ° ° ° ° °Monitored Anesthesia Care, Care After °These instructions provide you with information about caring for yourself after  your procedure. Your health care provider may also give you more specific instructions. Your treatment has been planned according to current medical practices, but problems sometimes occur. Call your health care provider if you have any problems or questions after your procedure. °What can I expect after the procedure? °After your procedure, it is common to: °· Feel sleepy for several hours. °· Feel clumsy and have poor balance for several hours. °· Feel forgetful about what happened after the procedure. °· Have poor judgment for several hours. °· Feel nauseous or vomit. °· Have a sore throat if you had a breathing tube during the procedure. ° °Follow these instructions at home: °For at least 24 hours after the procedure: ° °· Do not: °? Participate in activities in which you could fall or become injured. °? Drive. °? Use heavy machinery. °? Drink alcohol. °? Take sleeping pills or medicines that cause drowsiness. °? Make important decisions or sign legal documents. °? Take care of children on your own. °· Rest. °Eating and drinking °· Follow the diet that is recommended by your health care provider. °· If you vomit, drink water, juice, or soup when you can drink without vomiting. °· Make sure you have little or no nausea before eating solid foods. °General instructions °· Have a responsible adult stay with you until you are awake and alert. °· Take over-the-counter and prescription medicines only as told by your health care provider. °· If you smoke, do not smoke without supervision. °· Keep all follow-up visits as told by your health care provider. This is important. °Contact a health care provider if: °· You keep feeling nauseous or you keep vomiting. °· You feel light-headed. °· You develop a rash. °· You have a fever. °Get help right away if: °· You have trouble breathing. °This information is not intended to replace advice given to you by your health care provider. Make sure you discuss any questions you have  with your health care provider. °Document Released: 11/27/2015 Document Revised: 03/28/2016 Document Reviewed: 11/27/2015 °Elsevier Interactive Patient Education © 2018 [employer] ° °

## 2018-04-14 NOTE — Anesthesia Preprocedure Evaluation (Signed)
Anesthesia Evaluation  Patient identified by MRN, date of birth, ID band Patient awake    Reviewed: Allergy & Precautions, NPO status , Patient's Chart, lab work & pertinent test results  Airway Mallampati: III  TM Distance: >3 FB Neck ROM: Full    Dental no notable dental hx. (+) Teeth Intact   Pulmonary neg pulmonary ROS,    Pulmonary exam normal breath sounds clear to auscultation       Cardiovascular Exercise Tolerance: Poor negative cardio ROS Normal cardiovascular examII Rhythm:Regular Rate:Normal  Normal ecg-ST- -feels week   Neuro/Psych negative neurological ROS  negative psych ROS   GI/Hepatic negative GI ROS, Neg liver ROS,   Endo/Other  negative endocrine ROS  Renal/GU negative Renal ROS  negative genitourinary   Musculoskeletal negative musculoskeletal ROS (+)   Abdominal   Peds negative pediatric ROS (+)  Hematology negative hematology ROS (+)   Anesthesia Other Findings   Reproductive/Obstetrics negative OB ROS                             Anesthesia Physical Anesthesia Plan  ASA: III  Anesthesia Plan: MAC   Post-op Pain Management:    Induction:   PONV Risk Score and Plan:   Airway Management Planned: Nasal Cannula  Additional Equipment:   Intra-op Plan:   Post-operative Plan:   Informed Consent:   Plan Discussed with: CRNA  Anesthesia Plan Comments: (Here for port for Multiple Myeloma chemo)        Anesthesia Quick Evaluation

## 2018-04-14 NOTE — Interval H&P Note (Signed)
History and Physical Interval Note:  04/14/2018 8:58 AM  Alexandra Price  has presented today for surgery, with the diagnosis of multiple myloma  The various methods of treatment have been discussed with the patient and family. After consideration of risks, benefits and other options for treatment, the patient has consented to  Procedure(s): INSERTION PORT-A-CATH (Right) as a surgical intervention .  The patient's history has been reviewed, patient examined, no change in status, stable for surgery.  I have reviewed the patient's chart and labs.  Questions were answered to the patient's satisfaction.     [name]

## 2018-04-14 NOTE — Anesthesia Postprocedure Evaluation (Signed)
Anesthesia Post Note  Patient: Alexandra Price  Procedure(s) Performed: INSERTION PORT-A-CATH (Right Chest)  Patient location during evaluation: PACU Anesthesia Type: MAC Level of consciousness: awake and alert and patient cooperative Pain management: pain level controlled Vital Signs Assessment: post-procedure vital signs reviewed and stable Respiratory status: nonlabored ventilation, respiratory function stable and spontaneous breathing Cardiovascular status: blood pressure returned to baseline Postop Assessment: no apparent nausea or vomiting Anesthetic complications: no     Last Vitals:  Vitals:   04/14/18 0950 04/14/18 1045  BP: (!) 170/86 (!) 89/78  Pulse:  89  Resp: (!) 33 (!) 24  Temp:  36.6 C  SpO2: 97% 93%    Last Pain:  Vitals:   04/14/18 0912  TempSrc: Oral  PainSc: 0-No pain                 Daisi Kentner J

## 2018-04-14 NOTE — Transfer of Care (Signed)
Immediate Anesthesia Transfer of Care Note  Patient: Alexandra Price  Procedure(s) Performed: INSERTION PORT-A-CATH (Right Chest)  Patient Location: PACU  Anesthesia Type:MAC  Level of Consciousness: awake and patient cooperative  Airway & Oxygen Therapy: Patient Spontanous Breathing and Patient connected to nasal cannula oxygen  Post-op Assessment: Report given to RN, Post -op Vital signs reviewed and stable and Patient moving all extremities  Post vital signs: Reviewed and stable  Last Vitals:  Vitals Value Taken Time  BP    Temp    Pulse 89 04/14/2018 10:45 AM  Resp    SpO2 93 % 04/14/2018 10:45 AM  Vitals shown include unvalidated device data.  Last Pain:  Vitals:   04/14/18 0912  TempSrc: Oral  PainSc: 0-No pain      Patients Stated Pain Goal: 5 (93/79/02 4097)  Complications: No apparent anesthesia complications

## 2018-04-14 NOTE — Op Note (Signed)
Patient:  Alexandra Price  DOB:  01/05/1946  MRN:  [id number]   Preop Diagnosis: Multiple myeloma, need for central venous access  Postop Diagnosis: Same  Procedure: Port-A-Cath insertion  Surgeon: [name], MD  Anes: MAC  Indications: Patient is a 72 year old white female who presents for Port-A-Cath insertion.  She is about to undergo chemotherapy for multiple myeloma.  The risks and benefits of the procedure including bleeding, infection, and pneumothorax were fully explained to the patient, who gave informed consent.  Procedure note: The patient was placed in the Trendelenburg position after the right upper chest was prepped and draped using the usual sterile technique with DuraPrep.  Surgical site confirmation was performed.  1% Xylocaine was used for local anesthesia.  An incision was made below the right clavicle.  A subcutaneous pocket was formed.  A needle was advanced into the right subclavian vein using the Seldinger technique without difficulty.  A guidewire was then advanced into the right atrium under fluoroscopic guidance.  An introducer and peel-away sheath were placed over the guidewire.  The catheter was then inserted through the peel-away sheath and the peel-away sheath was removed.  The catheter was then attached to the port and the port placed in subcutaneous pocket.  Adequate positioning was confirmed by fluoroscopy.  Good backflow of blood was noted on aspiration of the port.  The port was flushed with heparin flush.  Subcutaneous layer was reapproximated using a 3-0 Vicryl interrupted suture.  The skin was closed using a 4-0 Monocryl subcuticular suture.  Dermabond was applied.  All tape and needle counts were correct at the end of the procedure.  The patient was transferred to PACU in stable condition.  A chest x-ray will be performed at that time.  Complications: None  EBL: Minimal  Specimen: None

## 2018-04-15 ENCOUNTER — Other Ambulatory Visit (HOSPITAL_COMMUNITY): Payer: Self-pay | Admitting: Nurse Practitioner

## 2018-04-15 ENCOUNTER — Inpatient Hospital Stay (HOSPITAL_COMMUNITY): Payer: Medicaid Other

## 2018-04-15 ENCOUNTER — Inpatient Hospital Stay (HOSPITAL_BASED_OUTPATIENT_CLINIC_OR_DEPARTMENT_OTHER): Payer: Medicaid Other | Admitting: Hematology

## 2018-04-15 ENCOUNTER — Encounter (HOSPITAL_COMMUNITY): Payer: Self-pay | Admitting: Hematology

## 2018-04-15 ENCOUNTER — Telehealth (HOSPITAL_COMMUNITY): Payer: Self-pay | Admitting: Hematology

## 2018-04-15 VITALS — BP 147/73 | HR 102 | Temp 97.6°F | Resp 16 | Wt 166.0 lb

## 2018-04-15 DIAGNOSIS — C9000 Multiple myeloma not having achieved remission: Secondary | ICD-10-CM | POA: Diagnosis not present

## 2018-04-15 DIAGNOSIS — N179 Acute kidney failure, unspecified: Secondary | ICD-10-CM

## 2018-04-15 DIAGNOSIS — D649 Anemia, unspecified: Secondary | ICD-10-CM

## 2018-04-15 DIAGNOSIS — M8580 Other specified disorders of bone density and structure, unspecified site: Secondary | ICD-10-CM

## 2018-04-15 DIAGNOSIS — Z5112 Encounter for antineoplastic immunotherapy: Secondary | ICD-10-CM | POA: Diagnosis not present

## 2018-04-15 DIAGNOSIS — Z7189 Other specified counseling: Secondary | ICD-10-CM | POA: Insufficient documentation

## 2018-04-15 MED ORDER — SODIUM CHLORIDE 0.9 % IV SOLN
Freq: Once | INTRAVENOUS | Status: AC
  Administered 2018-04-15: 10:00:00 via INTRAVENOUS
  Filled 2018-04-15: qty 4

## 2018-04-15 MED ORDER — HYDROCODONE-ACETAMINOPHEN 5-325 MG PO TABS: 1.0000 | ORAL_TABLET | Freq: Four times a day (QID) | ORAL | 0 refills | Status: DC | PRN

## 2018-04-15 MED ORDER — ZOLEDRONIC ACID 4 MG/100ML IV SOLN
4.0000 mg | Freq: Once | INTRAVENOUS | Status: AC
  Administered 2018-04-15: 4 mg via INTRAVENOUS
  Filled 2018-04-15: qty 100

## 2018-04-15 MED ORDER — LENALIDOMIDE 10 MG PO CAPS: 10.0000 mg | ORAL_CAPSULE | Freq: Every day | ORAL | 0 refills | Status: DC

## 2018-04-15 MED ORDER — DEXAMETHASONE 4 MG PO TABS: ORAL_TABLET | ORAL | 4 refills | Status: DC

## 2018-04-15 MED ORDER — ACYCLOVIR 400 MG PO TABS: 400.0000 mg | ORAL_TABLET | Freq: Every day | ORAL | 3 refills | Status: DC

## 2018-04-15 MED ORDER — TRAMADOL HCL 50 MG PO TABS: 50.0000 mg | ORAL_TABLET | Freq: Four times a day (QID) | ORAL | 0 refills | Status: DC | PRN

## 2018-04-15 MED ORDER — LIDOCAINE-PRILOCAINE 2.5-2.5 % EX CREA: TOPICAL_CREAM | CUTANEOUS | 3 refills | Status: DC

## 2018-04-15 MED ORDER — PROCHLORPERAZINE MALEATE 10 MG PO TABS: 10.0000 mg | ORAL_TABLET | Freq: Four times a day (QID) | ORAL | 1 refills | Status: DC | PRN

## 2018-04-15 MED ORDER — SODIUM CHLORIDE 0.9 % IV SOLN
INTRAVENOUS | Status: DC
  Administered 2018-04-15: 09:00:00 via INTRAVENOUS

## 2018-04-15 NOTE — Addendum Note (Signed)
Addended by: [name] on: 04/15/2018 07:05 PM   Modules accepted: Orders

## 2018-04-15 NOTE — Patient Instructions (Signed)
[facility] at [facility]  Discharge Instructions:  You were seen by Dr. K today _______________________________________________________________  Thank you for choosing [facility] at [facility] to provide your oncology and hematology care.  To afford each patient quality time with our providers, please arrive at least 15 minutes before your scheduled appointment.  You need to re-schedule your appointment if you arrive 10 or more minutes late.  We strive to give you quality time with our providers, and arriving late affects you and other patients whose appointments are after yours.  Also, if you no show three or more times for appointments you may be dismissed from the clinic.  Again, thank you for choosing [facility] at [facility]. Our hope is that these requests will allow you access to exceptional care and in a timely manner. _______________________________________________________________  If you have questions after your visit, please contact our office at [phone] between the hours of 8:30 a.m. and 5:00 p.m. Voicemails left after 4:30 p.m. will not be returned until the following business day. _______________________________________________________________  For prescription refill requests, have your pharmacy contact our office. _______________________________________________________________  Recommendations made by the consultant and any test results will be sent to your referring physician. _______________________________________________________________ 
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
## 2018-04-15 NOTE — Addendum Note (Signed)
Addended by: [name] on: 04/15/2018 04:35 PM   Modules accepted: Orders

## 2018-04-15 NOTE — Progress Notes (Signed)
Tolerated infusions w/o adverse reaction.  Reports nausea improved.  Discharged ambulatory in c/o family.

## 2018-04-15 NOTE — Telephone Encounter (Signed)
FILLED OUT RX REPLACEMENTS [facility] FOR ZOMETA,REVLIMID AND KYPROLIS. FIN DOC PENDING FROM PT

## 2018-04-15 NOTE — Progress Notes (Signed)
START ON PATHWAY REGIMEN - Multiple Myeloma and Other Plasma Cell Dyscrasias     A cycle is every 28 days:     Carfilzomib      Carfilzomib      Carfilzomib      Dexamethasone      Dexamethasone      Lenalidomide   **Always confirm dose/schedule in your pharmacy ordering system**  Patient Characteristics: Newly Diagnosed, Transplant Eligible, High Risk R-ISS Staging: II Disease Classification: Newly Diagnosed Is Patient Eligible for Transplant<= Transplant Eligible Risk Status: High Risk Intent of Therapy: Non-Curative / Palliative Intent, Discussed with Patient

## 2018-04-15 NOTE — Assessment & Plan Note (Addendum)
1.  IgG lambda plasma cell myeloma, stage II by R-ISS: - Presentation to [facility] with left-sided rib pain on 12/29/2017, found to have hypercalcemia, anemia and acute kidney injury, further work-up showed 3.2 g/dL of IgG lambda monoclonal gammopathy. -Skeletal survey reportedly demonstrated diffuse osteopenia and multiple lytic lesions in bilateral lower extremities. -Bone marrow biopsy on 12/31/2017 showed hypercellular marrow with 40 to 50% cellularity, involved by plasma cell neoplasm (17% plasma cells by manual aspirate differential count, greater than 90% and 30 to 40% by CD138 immunohistochemical analysis of clot and core respectively, and monotypic lambda by flow cytometry analysis) - Chromosome analysis showed 12 of 20 cells examined are normal, 46, X6.  The remaining 8 metaphase cells analyzed are abnormal and represent a hyperdiploid clone containing 1 extra copy each of chromosome 3, 5, 6, 7, 9, 11, 15, 19, 20, 21, 22 as well as a marker chromosome. -Multiple myeloma FISH panel shows nuclei positive for three 1q signals, trisomy 11, and extra signals of chromosome 7, 9 and 15. - Patient was recommended to start chemotherapy with port placement by Dr. Federico Flake at [facility].  She was recommended to have IV chemotherapy as she only has Medicare part A. Patient did not follow-up at [facility] - She does report on and off bilateral shoulder pains.  She can lie on the left shoulder.  She also reports on and off nausea for the last 3 to 4 weeks. -I have reviewed the results of blood work from 04/07/2018 which shows M spike of 4 g of IgG lambda monoclonal protein.  Lambda light chains were elevated at 59,667 with ratio equal to 0.  LDH was 115.  Beta-2 microglobulin was elevated at 12.8.  I have reviewed the images of the bone survey with the patient which showed large expansile lytic lesion in the left shoulder area and multiple lytic lesions in all the bones.  Patient was advised to not lift any  weights.  She does not have constant pain in the left shoulder.  We will consider radiation to the lytic lesion if there is uncontrollable pain. -I have talked to her about her diagnosis in detail.  We talked about treating multiple myeloma in an non-curative setting with triple drug regimen.  Given her problems with insurance, I would prefer an IV regimen to start with until we get Revlimid.  Because of her high risk disease, I have recommended carfilzomib-based regimen with cyclophosphamide and dexamethasone.  We will start Revlimid in place of cyclophosphamide when we are able to obtain the medicine.  We will likely start her on 10 mg tablet 2 weeks on/1 week off, adjusted to her renal function.  We talked about the side effects in detail.  We will also start her on acyclovir twice daily for shingles prophylaxis.  She was told to start taking aspirin 81 mg daily. -We will discuss about the role of autologous stem cell transplant down the line.  2.  Difficulty falling and staying asleep: -She had sleeping problem for many years.  She is taking Tylenol PM which is not helping.  She has taken Xanax and Ambien in the past. -I have prescribed Ambien 10 mg to be taken as needed at bedtime.  3.  Hypercalcemia: - Her calcium level was 12.7.  Creatinine was 2.4.  We will give her hydration with normal saline.  We will also give zoledronic acid 4 mg.  She has some nausea potentially related to hypercalcemia.

## 2018-04-15 NOTE — Progress Notes (Signed)
Chemotherapy teaching pulled together. 

## 2018-04-15 NOTE — Patient Instructions (Signed)
[facility] Chemotherapy Teaching   You have been diagnosed with multiple myeloma.  We are going to treat you with the intent of controlling your disease.  You are going to get Cytoxan (cyclophosphamide) Kyprolis (Carfilzomib) and Decadron (dexamethasone) on days 1,2 and 8,9 and 15, 16 every 28 days.  This essentially means twice weekly for three weeks and then one week off.  You will see the doctor regularly throughout treatment.  We monitor your lab work prior to every treatment. The doctor monitors your response to treatment by the way you are feeling, your blood work, and scans periodically.  There will be wait times while you are here for treatment.  It will take about 30 minutes to 1 hour for your lab work to result.  Then there will be wait times while pharmacy mixes your medications.    You will take the following pre medication each time prior to treatment:  Aloxi - high powered nausea medication to prevent chemotherapy induced nausea Dexamethasone - steroid - given to reduce the risk of you having an allergic type reaction to the chemotherapy.   Carfilzomib (Kyprolis)  About This Drug Carfilzomib is used to treat cancer. It is given in the vein (IV).  Possible Side Effects . Bone marrow suppression. This is a decrease in the number of white blood cells, red blood cells, and platelets. This may raise your risk of infection, make you tired and weak (fatigue), and raise your risk of bleeding . Nausea . Diarrhea (loose bowel movements) . Tiredness . Swelling of your legs, ankles and/or feet . Fever . Headache . Electrolyte changes . Muscle spasm . Trouble sleeping . Cough and trouble breathing . Upper respiratory infections  Note: Each of the side effects above was reported in 20% or greater of patients treated with carfilzomib alone or in combination with other medications. Not all possible side effects are included above.  Warnings and Precautions .  Congestive heart failure and/or risk of heart attack which can be life-threatening . Changes in your kidney function which can cause kidney failure and be life-threatening . Tumor lysis syndrome. This drug may act on the cancer cells very quickly. This may affect how your kidneys work. Tumor lysis can be life-threatening. . Inflammation (swelling) or scarring of the lungs or fluid build-up in your lungs, which can be lifethreatening. You may have a dry cough or trouble breathing which may be severe. . High blood pressure in arteries of the lungs . High blood pressure, which can be life-threatening . Blood clots and events such as heart attack. A blood clot in your leg may cause your leg to swell, appear red and warm, and/or cause pain. A blood clot in your lungs may cause trouble breathing, pain when breathing, and/or chest pain. Women should discuss choice of contraception and risk of blood clots with their medical team. . While you are getting this drug in your vein (IV), you may have a reaction to the drug, which can be life-threatening. Sometimes you may be given medication to stop or lessen these side effects. Your nurse will check you closely for these signs: fever or shaking chills, flushing, facial swelling, feeling dizzy, headache, trouble breathing, rash, itching, chest tightness, or chest pain. These reactions may happen after your infusion. If this happens, call 911 for emergency care. . Abnormal bleeding which can be life-threatening - symptoms may be coughing up blood, throwing up blood (may look like coffee grounds), red or black tarry bowel movements,  abnormally heavy menstrual flow, nosebleeds or any other unusual bleeding. . Severe decrease in platelets which can increase your risk of bleeding. . Rare blood vessels disorder called thrombotic microangiopathy which can affect some of your organs in your body such as your kidneys and can be life-threatening . Changes in your  liver function, which can cause liver failure and be life-threatening . Changes in your central nervous system can happen. The central nervous system is made up of your brain and spinal cord. You could feel extreme tiredness, agitation, confusion, hallucinations (see or hear things that are not there), trouble understanding or speaking, loss of control of your bowels or bladder, eyesight changes, numbness or lack of strength to your arms, legs, face, or body, and coma. If you start to have any of these symptoms let your doctor know right away.  Note: Some of the side effects above are very rare. If you have concerns and/or questions, please discuss them with your medical team.  Important Information . This drug may be present in the saliva, tears, sweat, urine, stool, vomit, semen, and vaginal secretions. Talk to your doctor and/or your nurse about the necessary precautions to take during this time. . This drug may impair your ability to drive or use machinery. Use caution and tell your nurse or doctor if you feel dizzy, very sleepy, and/or experience low blood pressure.  Treating Side Effects . Manage tiredness by pacing your activities for the day. . Be sure to include periods of rest between energy-draining activities. . To decrease the risk of infection, wash your hands regularly. . Avoid close contact with people who have a cold, the flu, or other infections. . Take your temperature as your doctor or nurse tells you, and whenever you feel like you may have a fever. . To help decrease bleeding, use a soft toothbrush. Check with your nurse before using dental floss. . Be very careful when using knives or tools. . Use an electric shaver instead of a razor. . Drink plenty of fluids (a minimum of eight glasses per day is recommended). . If you throw up or have loose bowel movements, you should drink more fluids so that you do not become dehydrated (lack of water in the body from losing  too much fluid). . To help with nausea and vomiting, eat small, frequent meals instead of three large meals a day. Choose foods and drinks that are at room temperature. Ask your nurse or doctor about other helpful tips and medicine that is available to help or stop lessen these symptoms. . If you get diarrhea, eat low-fiber foods that are high in protein and calories and avoid foods that can irritate your digestive tracts or lead to cramping. . Ask your nurse or doctor about medicine that can lessen or stop your diarrhea. Marland Kitchen Keeping your pain under control is important to your well-being. Please tell your doctor or nurse if you are experiencing pain. . If you are having trouble sleeping, talk to your nurse or doctor on tips to help you sleep better. . Infusion reactions may occur after your infusion. If this happens, call 911 for emergency care.  Food and Drug Interactions . There are no known interactions of carfilzomib with food. . This drug may interact with other medicines. Tell your doctor and pharmacist about all the prescription and over-the-counter medicines and dietary supplements (vitamins, minerals, herbs and others) that you are taking at this time. Also, check with your doctor or pharmacist before starting any  new prescription or over-the-counter medicines, or dietary supplements to make sure that there are no interactions.  When to Call the Doctor Call your doctor or nurse if you have any of these symptoms and/or any new or unusual symptoms: . Fever of 100.4 F (38 C) or higher . Chills . Fatigue that interferes with your daily activities . A headache that does not go away . Blurry vision or other changes in eyesight . Confusion and/or agitation . Hallucinations . Trouble understanding or speaking . Numbness or lack of strength to your arms, legs, face, or body . Feeling dizzy or lightheaded . Trouble falling or staying asleep . Easy bleeding or bruising . Coughing up  yellow, green, or bloody mucus . Wheezing or trouble breathing . Chest pain or symptoms of a heart attack. Most heart attacks involve pain in the center of the chest that lasts more than a few minutes. The pain may go away and come back or it can be constant. It can feel like pressure, squeezing, fullness, or pain. Sometimes pain is felt in one or both arms, the back, neck, jaw, or stomach. If any of these symptoms last 2 minutes, call 911. Marland Kitchen Feeling that your heart is beating fast or in a not normal way (palpitations) . Nausea that stops you from eating or drinking and/or is not relieved by prescribed medicines . Throwing up more than 3 times a day . Diarrhea, 4 times in one day or diarrhea with lack of strength or a feeling of being dizzy . Your leg or arm is swollen, red, warm and/or painful . Swelling of arms, hands, legs and/or feet . Weight gain of 5 pounds in one week (fluid retention) . Blood in your urine, vomit (bright red or coffee-ground) and/or stools (bright red, or black/tarry) . Decreased urine, or very dark urine . Signs of infusion reaction: fever or shaking chills, flushing, facial swelling, feeling dizzy, headache, trouble breathing, rash, itching, chest tightness, or chest pain. . Signs of possible liver problems: dark urine, pale bowel movements, bad stomach pain, feeling very tired and weak, unusual itching, or yellowing of the eyes or skin . Pain that does not go away or is not relieved by prescribed medicine . If you think you may be pregnant or may have impregnated your partner  Reproduction Warnings . Pregnancy warning: This drug can have harmful effects on the unborn baby. Women of child bearing potential should use effective methods of birth control during your cancer treatment and for 6 months after treatment. Men with female partners of child bearing potential should use effective methods of birth control during your cancer treatment and for 3 months after  your cancer treatment. Let your doctor know right away if you think you may be pregnant or may have impregnated your partner. . Breastfeeding warning: Women should not breastfeed during treatment and for 2 weeks after treatment because this drug could enter the breast milk and cause harm to a breastfeeding baby. . Fertility warning: In men and women both, this drug may affect your ability to have children in the future. Talk with your doctor or nurse if you plan to have children. Ask for information on sperm or egg banking.  Cyclophosphamide (Cytoxan)  About This Drug Cyclophosphamide is used to treat cancer. It is given orally (by mouth) and in the vein (IV).  Possible Side Effects . A decrease in the number of white blood cells which may raise your risk of infection . Fever and fever in  the setting of decreased white blood cells, which is a serious condition that can be life-threatening . Infection. Severe infections, including viral, bacterial and fungal, which can be life-threatening. . Nausea and vomiting (throwing up) . Diarrhea (loose bowel movements) . Hair loss. Hair loss is often temporary, although with certain medicine, hair loss can sometimes be permanent. Hair loss may happen suddenly or gradually. If you lose hair, you may lose it from your head, face, armpits, pubic area, chest, and/or legs. You may also notice your hair getting thin.  Note: Not all possible side effects are included above.  Warnings and Precautions . Severe bone marrow suppression, which can be life-threatening. This is a decrease in the number of white blood cells, red blood cells, and platelets. This may raise your risk of infection, make you tired and weak (fatigue), and raise your risk of bleeding. . Abnormal heart beat and/or changes in the tissue of the heart, which can be life-threatening. Some changes may happen that can cause your heart to have less ability to pump blood. . Effects on the  bladder and kidneys that may be life-threatening. This drug may cause inflammation (swelling), irritation and bleeding in the bladder and/or kidneys. You may have blood in your urine. . Changes in your liver function and blockage of small veins in the liver, which can cause liver failure and be life-threatening. . Inflammation (swelling) of the lungs, and changes to the small vessels of your lungs. You may have a dry cough or trouble breathing. . This drug may cause slow wound healing . Electrolyte changes, especially a decrease in sodium which can be life-threatening . This drug may raise your risk of getting a second cancer . These side effects may be more severe if you are receiving high doses of this medication included in pre-transplant chemotherapy.  Note: Some of the side effects above are very rare. If you have concerns and/or questions, please discuss them with your medical team.  Important Information . Your doctor may prescribe you to drink extra fluids after your treatment to flush your bladder and urinate often to help decrease the risk of the effects on your bladder. . If you must have emergency surgery or have an accident that results in a wound, tell the doctor that you are on cyclophosphamide.  How to Take Your Medication . For Oral Only: Swallow the medicine whole with or without food. Do not chew, break or crush it. Do not touch a broken or crushed tablet. Do not take the medicine at bedtime. . Missed dose: If you miss a dose, do not take 2 doses at the same time or extra doses. Call your doctor or nurse for further instructions. . Handling: Wash your hands after handling your medicine, your caretakers should not handle your medicine with bare hands and should wear latex gloves. . This drug may be present in the saliva, tears, sweat, urine, stool, vomit, semen, and vaginal secretions. Talk to your doctor and/or your nurse about the necessary precautions to take  during this time. . If you get any of the content of a broken capsules on your skin, you should wash the area of the skin well with soap and water right away. Call your doctor if you get a skin reaction. . Storage: Store this medicine in the original container at room temperature. Do not store at temperature above 30C (67F). . Disposal of unused oral medicine: Do not flush any expired and/or unused medicine down the toilet or drain  unless you are specifically instructed to do so on the medication label. Some facilities have take-back programs and/or other options. If you do not have a take-back program in your area, then please discuss with your nurse or your doctor how to dispose of unused medicine.  Treating Side Effects . Manage tiredness by pacing your activities for the day. . Be sure to include periods of rest between energy-draining activities. . To decrease the risk of infection, wash your hands regularly. . Avoid close contact with people who have a cold, the flu, or other infections. . Take your temperature as your doctor or nurse tells you, and whenever you feel like you may have a fever. . To help decrease the risk of bleeding, use a soft toothbrush. Check with your nurse before using dental floss. . Be very careful when using knives or tools. . Use an electric shaver instead of a razor. Marland Kitchen Keeping your pain under control is important to your well-being. Please tell your doctor or nurse if you are experiencing pain. . Drink plenty of fluids (a minimum of eight glasses per day is recommended). . If you throw up or have loose bowel movements, you should drink more fluids so that you do not become dehydrated (lack of water in the body from losing too much fluid). . If you have diarrhea, eat low-fiber foods that are high in protein and calories and avoid foods that can irritate your digestive tracts or lead to cramping. . Ask your nurse or doctor about medicine that can lessen  or stop your diarrhea. . To help with nausea and vomiting, eat small, frequent meals instead of three large meals a day. Choose foods and drinks that are at room temperature. Ask your nurse or doctor about other helpful tips and medicine that is available to help stop or lessen these symptoms. . To help with hair loss, wash with a mild shampoo and avoid washing your hair every day. . Avoid rubbing your scalp, pat your hair or scalp dry. . Avoid coloring your hair. . Limit your use of hair spray, electric curlers, blow dryers, and curling irons. . If you are interested in getting a wig, talk to your nurse. You can also call the [employer] at 800-ACS-2345 to find out information about the "Look Good, Feel Better" program close to where you live. It is a free program where women getting chemotherapy can learn about wigs, turbans and scarves as well as makeup techniques and skin and nail care.  Food and Drug Interactions . There are no known interactions of cyclophosphamide with food. . Check with your doctor or pharmacist about all other prescription medicines and over-the-counter medicines and dietary supplements (vitamins, minerals, herbs and others) you are taking before starting this medicine as there are known drug interactions with cyclophosphamide Also, check with your doctor or pharmacist before starting any new prescription or over-the-counter medicines, or dietary supplement to make sure that there are no interactions.  When to Call the Doctor Call your doctor or nurse if you have any of these symptoms and/or any new or unusual symptoms: . Fever of 100.4 F (38 C) or higher . Chills . Tiredness that interferes with your daily activities . Feeling dizzy or lightheaded . Easy bleeding or bruising . Dry cough . Trouble breathing . Swelling of arms, hands, legs, and/or feet . Weight gain of 5 pounds in one week (fluid retention) . Feeling that your heart is beating in  a fast or not  normal way (palpitations) . Pain in your chest . Nausea that stops you from eating or drinking and/or is not relieved by prescribed medicines . Throwing up more than 3 times a day . Diarrhea, 4 times in one day or diarrhea with lack of strength or a feeling of being dizzy . Decreased urine, or very dark urine . Pain when passing urine; blood in urine . Decreased urine or difficulty urinating . Signs of possible liver problems: dark urine, pale bowel movements, bad stomach pain, feeling very tired and weak, unusual itching, or yellowing of the eyes or skin . If you think you may be pregnant or may have impregnated your partner  Reproduction Warnings . Pregnancy warning: This drug can have harmful effects on the unborn baby. Women of childbearing potential should use highly effective methods of birth control during your cancer treatment and for 1 year after treatment. Men with female partners who are pregnant or may become pregnant should use a condom during your cancer treatment and for at least 4 months after your cancer treatment. Let your doctor know right away if you think you may be pregnant or may have impregnated your partner. . In women, menstrual bleeding may become irregular or stop while you are getting this drug. Do not assume that you cannot become pregnant if you do not have a menstrual period. . Breastfeeding warning: This drug passes into breast milk. For this reason, women should talk to their doctor about the risks and benefits of breastfeeding during treatment with this drug because this drug may enter the breast milk and cause harm to a breastfeeding baby. . Fertility warning: In men and women both, this drug may affect your ability to have children in the future. Talk with your doctor or nurse if you plan to have children. Ask for information on sperm or egg banking.  Dexamethasone (Decadron)  About This Drug Dexamethasone is used to treat cancer, to  decrease inflammation and sometimes used before and after chemotherapy to prevent or treat nausea and/or vomiting. It is given in the vein (IV) or orally (by mouth).  Possible Side Effects . Headache . High blood pressure . Abnormal heart beat . Tiredness and weakness . Changes in mood, which may include depression or a feeling of extreme well-being . Trouble sleeping . Increased sweating . Increased appetite (increased hunger) . Weight gain . Increase risk of infections . Pain in your abdomen . Nausea . Skin changes such as rash, dryness, redness . Blood sugar levels may change . Electrolyte changes . Swelling of your legs, ankles and/or feet . Changes in your liver function . You may be at risk for cataracts, glaucoma or infections of the eye . Muscle loss and / or weakness (lack of muscle strength) . Increased risk of developing osteoporosis- your bones may become weak and brittle  Note: Not all possible side effects are included above.  Warnings and Precautions . This drug may cause you to feel irritable, nervous or restless. . Allergic reactions, including anaphylaxis are rare but may happen in some patients. Signs of allergic reaction to this drug may be swelling of the face, feeling like your tongue or throat are swelling, trouble breathing, rash, itching, fever, chills, feeling dizzy, and/or feeling that your heart is beating in a fast or not normal way. If this happens, do not take another dose of this drug. You should get urgent medical treatment. . High blood pressure and changes in electrolytes, which can cause fluid build-up around  your heart, lungs or elsewhere. . Increased risk of developing a hole in your stomach, small, and/or large intestine if you have ulcers in the lining of your stomach and/or intestine, or have diverticulitis, ulcerative colitis and/or other diseases that affect the gastrointestinal tract. . Effects on the endocrine glands including the  pituitary, adrenals or thyroid during or after use of this medication. . Changes in the tissue of the heart, that can cause your heart to have less ability to pump blood. You may be short of breath or our arms, hands, legs and feet may swell. . Increased risk of heart attack. . Severe depression and other psychiatric disorders such as mood changes. . Burning, pain and itching around your anus may happen when this drug is given in the vein too rapidly (IV). It usually happens suddenly and resolves in less than 1 minute.  Important Information . Talk to your doctor or your nurse before stopping this medication, it should be stopped gradually. Depending on the dose and length of treatment, you could experience serious side effects if stopped abruptly (suddenly). . Talk to your doctor before receiving any vaccinations during your treatment. Some vaccinations are not recommended while receiving dexamethasone.  How to Take Your Medication . For Oral (by mouth): You can take the medicine with or without food. If you have nausea or upset stomach, take it with food. . Missed dose: If you miss a dose, do not take 2 doses at the same time or extra doses. . If you vomit a dose, take your next dose at the regular time. Do not take 2 doses at the same time . Handling: Wash your hands after handling your medicine, your caretakers should not handle your medicine with bare hands and should wear latex gloves. . Storage: Store this medicine in the original container at room temperature. Protect from moisture and light. Discuss with your nurse or your doctor how to dispose of unused medicine.  Treating Side Effects . Drink plenty of fluids (a minimum of eight glasses per day is recommended). . To help with nausea and vomiting, eat small, frequent meals instead of three large meals a day. Choose foods and drinks that are at room temperature. Ask your nurse or doctor about other helpful tips and medicine  that is available to help stop or lessen these symptoms. . If you throw up, you should drink more fluids so that you do not become dehydrated (lack of water in the body from losing too much fluid). . Manage tiredness by pacing your activities for the day. . Be sure to include periods of rest between energy-draining activities. . To help with muscle weakness, get regular exercise. If you feel too tired to exercise vigorously, try taking a short walk. . If you are having trouble sleeping, talk to your nurse or doctor on tips to help you sleep better. . If you are feeling depressed, talk to your nurse or doctor about it. Marland Kitchen Keeping your pain under control is important to your well-being. Please tell your doctor or nurse if you are experiencing pain. . If you have diabetes, keep good control of your blood sugar level. Tell your nurse or your doctor if your glucose levels are higher or lower than normal. . To decrease the risk of infection, wash your hands regularly. . Avoid close contact with people who have a cold, the flu, or other infections. . Take your temperature as your doctor or nurse tells you, and whenever you feel like you  may have a fever. . If you get a rash do not put anything on it unless your doctor or nurse says you may. Keep the area around the rash clean and dry. Ask your doctor for medicine if your rash bothers you. . Moisturize your skin several times day. . Avoid sun exposure and apply sunscreen routinely when outdoors.  Food and Drug Interactions . There are no known interactions of dexamethasone with food. . Check with your doctor or pharmacist about all other prescription medicines and over-the-counter medicines and dietary supplements (vitamins, minerals, herbs and others) you are taking before starting this medicine as there are known drug interactions with dexamethasone. Also, check with your doctor or pharmacist before starting any new prescription or  over-the-counter medicines, or dietary supplement to make sure that there are no interactions. . There are known interactions of dexamethasone with other medicines and products like acetaminophen, aspirin, and ibuprofen. Ask your doctor what over-the-counter (OTC) medicines you can take.  When to Call the Doctor Call your doctor or nurse if you have any of these symptoms and/or any new or unusual symptoms: . Fever of 100.4 F (38 C) or higher . Chills . A headache that does not go away . Trouble breathing . Blurry vision or other changes in eyesight . Feel irritable, nervous or restless . Trouble falling or staying asleep . Severe mood changes such as depression or unusual thoughts and/or behaviors . Thoughts of hurting yourself or others, and suicide . Tiredness that interferes with your daily activities . Feeling that your heart is beating in a fast, slow or not normal way . Feeling dizzy or lightheaded . Chest pain or symptoms of a heart attack. Most heart attacks involve pain in the center of the chest that lasts more than a few minutes. The pain may go away and come back, or it can be constant. It can feel like pressure, squeezing, fullness, or pain. Sometimes pain is felt in one or both arms, the back, neck, jaw, or stomach. If any of these symptoms last 2 minutes, call 911. Marland Kitchen Heartburn or indigestion . Nausea that stops you from eating or drinking and/or is not relieved by prescribed medicines . Throwing up more than 3 times a day . Pain in your abdomen that does not go away . Abnormal blood sugar . Unusual thirst, passing urine often, headache, sweating, shakiness, irritability . Swelling of legs, ankles, or feet . Weight gain of 5 pounds in one week (fluid retention) . Signs of possible liver problems: dark urine, pale bowel movements, bad stomach pain, feeling very tired and weak, unusual itching, or yellowing of the eyes or skin . Severe muscle weakness . A new rash or  a rash that is not relieved by prescribed medicines . Signs of allergic reaction: swelling of the face, feeling like your tongue or throat are swelling, trouble breathing, rash, itching, fever, chills, feeling dizzy, and/or feeling that your heart is beating in a fast or not normal way. If this happens, call 911 for emergency care. . If you think you may be pregnant  Reproduction Warnings . Pregnancy warning: It is not known if this drug may harm an unborn child. For this reason, be sure to talk with your doctor if you are pregnant or planning to become pregnant while receiving this drug. Let your doctor know right away if you think you may be pregnant or may have impregnated your partner. . Breastfeeding warning: It is not known if this drug passes  into breast milk. For this reason, women should talk to their doctor about the risks and benefits of breastfeeding during treatment with this drug because this drug may enter the breast milk and cause harm to a breastfeeding baby. . Fertility warning: Human fertility studies have not been done with this drug. Talk with your doctor or nurse if you plan to have children. Ask for information on sperm banking.    SELF CARE ACTIVITIES WHILE ON CHEMOTHERAPY:  Hydration Increase your fluid intake 48 hours prior to treatment and drink at least 8 to 12 cups (64 ounces) of water/decaffeinated beverages per day after treatment. You can still have your cup of coffee or soda but these beverages do not count as part of your 8 to 12 cups that you need to drink daily. No alcohol intake.  Medications Continue taking your normal prescription medication as prescribed.  If you start any new herbal or new supplements please let us know first to make sure it is safe.  Mouth Care Have teeth cleaned professionally before starting treatment. Keep dentures and partial plates clean. Use soft toothbrush and do not use mouthwashes that contain alcohol. Biotene is a good  mouthwash that is available at most pharmacies or may be ordered by calling [phone]. Use warm salt water gargles (1 teaspoon salt per 1 quart warm water) before and after meals and at bedtime. Or you may rinse with 2 tablespoons of three-percent hydrogen peroxide mixed in eight ounces of water. If you are still having problems with your mouth or sores in your mouth please call the clinic. If you need dental work, please let the doctor know before you go for your appointment so that we can coordinate the best possible time for you in regards to your chemo regimen. You need to also let your dentist know that you are actively taking chemo. We may need to do labs prior to your dental appointment.  Skin Care Always use sunscreen that has not expired and with SPF (Sun Protection Factor) of 50 or higher. Wear hats to protect your head from the sun. Remember to use sunscreen on your hands, ears, face, & feet.  Use good moisturizing lotions such as udder cream, eucerin, or even Vaseline. Some chemotherapies can cause dry skin, color changes in your skin and nails.    . Avoid long, hot showers or baths. . Use gentle, fragrance-free soaps and laundry detergent. . Use moisturizers, preferably creams or ointments rather than lotions because the thicker consistency is better at preventing skin dehydration. Apply the cream or ointment within 15 minutes of showering. Reapply moisturizer at night, and moisturize your hands every time after you wash them.  Hair Loss (if your doctor says your hair will fall out)  . If your doctor says that your hair is likely to fall out, decide before you begin chemo whether you want to wear a wig. You may want to shop before treatment to match your hair color. . Hats, turbans, and scarves can also camouflage hair loss, although some people prefer to leave their heads uncovered. If you go bare-headed outdoors, be sure to use sunscreen on your scalp. . Cut your hair short. It  eases the inconvenience of shedding lots of hair, but it also can reduce the emotional impact of watching your hair fall out. . Don't perm or color your hair during chemotherapy. Those chemical treatments are already damaging to hair and can enhance hair loss. Once your chemo treatments are done and your  hair has grown back, it's OK to resume dyeing or perming hair. With chemotherapy, hair loss is almost always temporary. But when it grows back, it may be a different color or texture. In older adults who still had hair color before chemotherapy, the new growth may be completely gray.  Often, new hair is very fine and soft.  Infection Prevention Please wash your hands for at least 30 seconds using warm soapy water. Handwashing is the #1 way to prevent the spread of germs. Stay away from sick people or people who are getting over a cold. If you develop respiratory systems such as green/yellow mucus production or productive cough or persistent cough let us know and we will see if you need an antibiotic. It is a good idea to keep a pair of gloves on when going into grocery stores/[employer] to decrease your risk of coming into contact with germs on the carts, etc. Carry alcohol hand gel with you at all times and use it frequently if out in public. If your temperature reaches 100.5 or higher please call the clinic and let us know.  If it is after hours or on the weekend please go to the ER if your temperature is over 100.5.  Please have your own personal thermometer at home to use.    Sex and bodily fluids If you are going to have sex, a condom must be used to protect the person that isn't taking chemotherapy. Chemo can decrease your libido (sex drive). For a few days after chemotherapy, chemotherapy can be excreted through your bodily fluids.  When using the toilet please close the lid and flush the toilet twice.  Do this for a few day after you have had chemotherapy.   Effects of chemotherapy on your sex  life Some changes are simple and won't last long. They won't affect your sex life permanently. Sometimes you may feel: . too tired . not strong enough to be very active . sick or sore  . not in the mood . anxious or low Your anxiety might not seem related to sex. For example, you may be worried about the cancer and how your treatment is going. Or you may be worried about money, or about how you family are coping with your illness. These things can cause stress, which can affect your interest in sex. It's important to talk to your partner about how you feel. Remember - the changes to your sex life don't usually last long. There's usually no medical reason to stop having sex during chemo. The drugs won't have any long term physical effects on your performance or enjoyment of sex. Cancer can't be passed on to your partner during sex  Contraception It's important to use reliable contraception during treatment. Avoid getting pregnant while you or your partner are having chemotherapy. This is because the drugs may harm the baby. Sometimes chemotherapy drugs can leave a man or woman infertile.  This means you would not be able to have children in the future. You might want to talk to someone about permanent infertility. It can be very difficult to learn that you may no longer be able to have children. Some people find counselling helpful. There might be ways to preserve your fertility, although this is easier for men than for women. You may want to speak to a fertility expert. You can talk about sperm banking or harvesting your eggs. You can also ask about other fertility options, such as donor eggs. If you have or  have had breast cancer, your doctor might advise you not to take the contraceptive pill. This is because the hormones in it might affect the cancer.  It is not known for sure whether or not chemotherapy drugs can be passed on through semen or secretions from the vagina. Because of this some  doctors advise people to use a barrier method if you have sex during treatment. This applies to vaginal, anal or oral sex. Generally, doctors advise a barrier method only for the time you are actually having the treatment and for about a week after your treatment. Advice like this can be worrying, but this does not mean that you have to avoid being intimate with your partner. You can still have close contact with your partner and continue to enjoy sex.  Animals If you have cats or birds we just ask that you not change the litter or change the cage.  Please have someone else do this for you while you are on chemotherapy.   Food Safety During and After Cancer Treatment Food safety is important for people both during and after cancer treatment. Cancer and cancer treatments, such as chemotherapy, radiation therapy, and stem cell/bone marrow transplantation, often weaken the immune system. This makes it harder for your body to protect itself from foodborne illness, also called food poisoning. Foodborne illness is caused by eating food that contains harmful bacteria, parasites, or viruses.  Foods to avoid Some foods have a higher risk of becoming tainted with bacteria. These include: Marland Kitchen Unwashed fresh fruit and vegetables, especially leafy vegetables that can hide dirt and other contaminants . Raw sprouts, such as alfalfa sprouts . Raw or undercooked beef, especially ground beef, or other raw or undercooked meat and poultry . Fatty, fried, or spicy foods immediately before or after treatment.  These can sit heavy on your stomach and make you feel nauseous. . Raw or undercooked shellfish, such as oysters. . Sushi and sashimi, which often contain raw fish.  . Unpasteurized beverages, such as unpasteurized fruit juices, raw milk, raw yogurt, or cider . Undercooked eggs, such as soft boiled, over easy, and poached; raw, unpasteurized eggs; or foods made with raw egg, such as homemade raw cookie dough and  homemade mayonnaise Simple steps for food safety Shop smart. . Do not buy food stored or displayed in an unclean area. . Do not buy bruised or damaged fruits or vegetables. . Do not buy cans that have cracks, dents, or bulges. . Pick up foods that can spoil at the end of your shopping trip and store them in a cooler on the way home. Prepare and clean up foods carefully. . Rinse all fresh fruits and vegetables under running water, and dry them with a clean towel or paper towel. . Clean the top of cans before opening them. . After preparing food, wash your hands for 20 seconds with hot water and soap. Pay special attention to areas between fingers and under nails. . Clean your utensils and dishes with hot water and soap. Marland Kitchen Disinfect your kitchen and cutting boards using 1 teaspoon of liquid, unscented bleach mixed into 1 quart of water.   Dispose of old food. . Eat canned and packaged food before its expiration date (the "use by" or "best before" date). . Consume refrigerated leftovers within 3 to 4 days. After that time, throw out the food. Even if the food does not smell or look spoiled, it still may be unsafe. Some bacteria, such as Listeria, can grow even  on foods stored in the refrigerator if they are kept for too long. Take precautions when eating out. . At restaurants, avoid buffets and salad bars where food sits out for a long time and comes in contact with many people. Food can become contaminated when someone with a virus, often a norovirus, or another "bug" handles it. . Put any leftover food in a "to-go" container yourself, rather than having the server do it. And, refrigerate leftovers as soon as you get home. . Choose restaurants that are clean and that are willing to prepare your food as you order it cooked.   MEDICATIONS:                                                                                                                                                                 Compazine/Prochlorperazine 21m tablet. Take 1 tablet every 6 hours as needed for nausea/vomiting. (This can make you sleepy)   EMLA cream. Apply a quarter size amount to port site 1 hour prior to chemo. Do not rub in. Cover with plastic wrap.   Over-the-Counter Meds:  Colace - 100 mg capsules - take 2 capsules daily.  If this doesn't help then you can increase to 2 capsules twice daily.  Call u[place]oreaif this does not help your bowels move.   Imodium 261mcapsule. Take 2 capsules after the 1st loose stool and then 1 capsule every 2 hours until you go a total of 12 hours without having a loose stool. Call the [facility] loose stools continue. If diarrhea occurs at bedtime, take 2 capsules at bedtime. Then take 2 capsules every 4 hours until morning. Call [facility]   Diarrhea Sheet   If you are having loose stools/diarrhea, please purchase Imodium and begin taking as outlined:  At the first sign of poorly formed or loose stools you should begin taking Imodium (loperamide) 2 mg capsules.  Take two caplets (21m8mfollowed by one caplet (2mg41mvery 2 hours until you have had no diarrhea for 12 hours.  During the night take two caplets (21mg)35m bedtime and continue every 4 hours during the night until the morning.  Stop taking Imodium only after there is no sign of diarrhea for 12 hours.    Always call the [place]ou are having loose stools/diarrhea that you can't get under control.  Loose stools/diarrhea leads to dehydration (loss of water) in your body.  We have other options of trying to get the loose stools/diarrhea to stop but you must let us knKorea!   Constipation Sheet  Colace - 100 mg capsules - take 2 capsules daily.  If this doesn't help then you can increase to 2 capsules twice daily.  Please call if the above does not  work for you.   Do not go more than 2 days without a bowel movement.  It is very important that you do not become constipated.  It will make you feel  sick to your stomach (nausea) and can cause abdominal pain and vomiting.   Nausea Sheet   Compazine/Prochlorperazine 5m tablet. Take 1 tablet every 6 hours as needed for nausea/vomiting. (This can make you sleepy)  If you are having persistent nausea (nausea that does not stop) please call the CVeraand let u[place]ore[place]now the amount of nausea that you are experiencing.  If you begin to vomit, you need to call the [facility]and if it is the weekend and you have vomited more than one time and can't get it to stop-go to the Emergency Room.  Persistent nausea/vomiting can lead to dehydration (loss of fluid in your body) and will make you feel terrible.   Ice chips, sips of clear liquids, foods that are @ room temperature, crackers, and toast tend to be better tolerated.   SYMPTOMS TO REPORT AS SOON AS POSSIBLE AFTER TREATMENT:   FEVER GREATER THAN 100.5 F  CHILLS WITH OR WITHOUT FEVER  NAUSEA AND VOMITING THAT IS NOT CONTROLLED WITH YOUR NAUSEA MEDICATION  UNUSUAL SHORTNESS OF BREATH  UNUSUAL BRUISING OR BLEEDING  TENDERNESS IN MOUTH AND THROAT WITH OR WITHOUT PRESENCE OF ULCERS  URINARY PROBLEMS  BOWEL PROBLEMS  UNUSUAL RASH      Wear comfortable clothing and clothing appropriate for easy access to any Portacath or PICC line. Let uKoreaknow if there is anything that we can do to make your therapy better!    What to do if you need assistance after hours or on the weekends: CALL [phone]  HOLD on the line, do not hang up.  You will hear multiple messages but at the end you will be connected with a nurse triage line.  They will contact the doctor if necessary.  Most of the time they will be able to assist you.  Do not call the hospital operator.      I have been informed and understand all of the instructions given to me and have received a copy. I have been instructed to call the clinic ([phone] my family physician as soon as possible for continued medical  care, if indicated. I do not have any more questions at this time but understand that I may call the CB[facility]woodor the Patient Navigator at [phone] office hours should I have questions or need assistance in obtaining follow-up care.

## 2018-04-16 ENCOUNTER — Telehealth (HOSPITAL_COMMUNITY): Payer: Self-pay | Admitting: Hematology

## 2018-04-16 NOTE — Telephone Encounter (Signed)
FAXED PT ASSIST APP AND REVLIMID TO [facility].

## 2018-04-17 ENCOUNTER — Other Ambulatory Visit: Payer: Self-pay

## 2018-04-17 ENCOUNTER — Other Ambulatory Visit (HOSPITAL_COMMUNITY): Payer: Self-pay | Admitting: *Deleted

## 2018-04-17 ENCOUNTER — Encounter (HOSPITAL_COMMUNITY): Payer: Self-pay

## 2018-04-17 ENCOUNTER — Inpatient Hospital Stay (HOSPITAL_COMMUNITY): Payer: Medicaid Other

## 2018-04-17 VITALS — BP 126/62 | HR 66 | Temp 98.6°F | Resp 18 | Ht 64.0 in | Wt 169.0 lb

## 2018-04-17 DIAGNOSIS — Z5112 Encounter for antineoplastic immunotherapy: Secondary | ICD-10-CM | POA: Diagnosis not present

## 2018-04-17 DIAGNOSIS — C9000 Multiple myeloma not having achieved remission: Secondary | ICD-10-CM

## 2018-04-17 MED ORDER — SODIUM CHLORIDE 0.9 % IV SOLN
300.0000 mg/m2 | Freq: Once | INTRAVENOUS | Status: AC
  Administered 2018-04-17: 540 mg via INTRAVENOUS
  Filled 2018-04-17: qty 27

## 2018-04-17 MED ORDER — SODIUM CHLORIDE 0.9 % IV SOLN
10.0000 mg | Freq: Once | INTRAVENOUS | Status: AC
  Administered 2018-04-17: 10 mg via INTRAVENOUS
  Filled 2018-04-17: qty 1

## 2018-04-17 MED ORDER — SODIUM CHLORIDE 0.9 % IV SOLN: Freq: Once | INTRAVENOUS | Status: DC

## 2018-04-17 MED ORDER — DEXTROSE 5 % IV SOLN
20.0000 mg/m2 | Freq: Once | INTRAVENOUS | Status: AC
  Administered 2018-04-17: 36 mg via INTRAVENOUS
  Filled 2018-04-17: qty 3

## 2018-04-17 MED ORDER — SODIUM CHLORIDE 0.9 % IV SOLN
Freq: Once | INTRAVENOUS | Status: AC
  Administered 2018-04-17: 10:00:00 via INTRAVENOUS

## 2018-04-17 MED ORDER — PALONOSETRON HCL INJECTION 0.25 MG/5ML
0.2500 mg | Freq: Once | INTRAVENOUS | Status: AC
  Administered 2018-04-17: 0.25 mg via INTRAVENOUS
  Filled 2018-04-17: qty 5

## 2018-04-17 MED ORDER — HEPARIN SOD (PORK) LOCK FLUSH 100 UNIT/ML IV SOLN
500.0000 [IU] | Freq: Once | INTRAVENOUS | Status: DC | PRN
  Filled 2018-04-17: qty 5

## 2018-04-17 NOTE — Progress Notes (Signed)
Chemotherapy education packet given and reviewed in detail with pt and family.  All questions were answered, and pt verbalizes understanding of information provided.   Tolerated infusions w/o adverse reaction.  Alert, in no distress.  VSS.  Discharged ambulatory in c/o family.

## 2018-04-18 ENCOUNTER — Inpatient Hospital Stay (HOSPITAL_COMMUNITY): Payer: Medicaid Other

## 2018-04-18 VITALS — BP 150/78 | HR 53 | Temp 98.6°F | Resp 16

## 2018-04-18 DIAGNOSIS — C9000 Multiple myeloma not having achieved remission: Secondary | ICD-10-CM

## 2018-04-18 DIAGNOSIS — Z5112 Encounter for antineoplastic immunotherapy: Secondary | ICD-10-CM | POA: Diagnosis not present

## 2018-04-18 MED ORDER — SODIUM CHLORIDE 0.9 % IV SOLN
Freq: Once | INTRAVENOUS | Status: AC
  Administered 2018-04-18: 11:00:00 via INTRAVENOUS

## 2018-04-18 MED ORDER — SODIUM CHLORIDE 0.9 % IV SOLN
10.0000 mg | Freq: Once | INTRAVENOUS | Status: AC
  Administered 2018-04-18: 10 mg via INTRAVENOUS
  Filled 2018-04-18: qty 1

## 2018-04-18 MED ORDER — SODIUM CHLORIDE 0.9 % IV SOLN
Freq: Once | INTRAVENOUS | Status: AC
  Administered 2018-04-18: 10:00:00 via INTRAVENOUS

## 2018-04-18 MED ORDER — SODIUM CHLORIDE 0.9% FLUSH
10.0000 mL | INTRAVENOUS | Status: DC | PRN
  Administered 2018-04-18: 10 mL
  Filled 2018-04-18: qty 10

## 2018-04-18 MED ORDER — DEXTROSE 5 % IV SOLN
20.0000 mg/m2 | Freq: Once | INTRAVENOUS | Status: AC
  Administered 2018-04-18: 36 mg via INTRAVENOUS
  Filled 2018-04-18: qty 18

## 2018-04-18 MED ORDER — HEPARIN SOD (PORK) LOCK FLUSH 100 UNIT/ML IV SOLN
500.0000 [IU] | Freq: Once | INTRAVENOUS | Status: AC | PRN
  Administered 2018-04-18: 500 [IU]

## 2018-04-18 NOTE — Progress Notes (Signed)
Treatment given per orders. Patient tolerated it well without problems. Vitals stable and discharged home from clinic ambulatory. Follow up as scheduled.  

## 2018-04-18 NOTE — Patient Instructions (Signed)
[facility] Discharge Instructions for Patients Receiving Chemotherapy   Beginning January 23rd 2017 lab work for the Cancer Center will be done in the  Main lab at [facility] on 1st floor. If you have a lab appointment with the Cancer Center please come in thru the  Main Entrance and check in at the main information desk   Today you received the following chemotherapy agents   To help prevent nausea and vomiting after your treatment, we encourage you to take your nausea medication     If you develop nausea and vomiting, or diarrhea that is not controlled by your medication, call the clinic.  The clinic phone number is [phone]. Office hours are Monday-Friday 8:30am-5:00pm.  BELOW ARE SYMPTOMS THAT SHOULD BE REPORTED IMMEDIATELY:  *FEVER GREATER THAN 101.0 F  *CHILLS WITH OR WITHOUT FEVER  NAUSEA AND VOMITING THAT IS NOT CONTROLLED WITH YOUR NAUSEA MEDICATION  *UNUSUAL SHORTNESS OF BREATH  *UNUSUAL BRUISING OR BLEEDING  TENDERNESS IN MOUTH AND THROAT WITH OR WITHOUT PRESENCE OF ULCERS  *URINARY PROBLEMS  *BOWEL PROBLEMS  UNUSUAL RASH Items with * indicate a potential emergency and should be followed up as soon as possible. If you have an emergency after office hours please contact your primary care physician or go to the nearest emergency department.  Please call the clinic during office hours if you have any questions or concerns.   You may also contact the Patient Navigator at [phone] should you have any questions or need assistance in obtaining follow up care.      Resources For Cancer Patients and their Caregivers ? American Cancer Society: Can assist with transportation, wigs, general needs, runs Look Good Feel Better.        1-888-227-6333 ? Cancer Care: Provides financial assistance, online support groups, medication/co-pay assistance.  1-800-813-HOPE (4673) ? [facility] Assists [place] cancer  patients and their families through emotional , educational and financial support.  336-427-4357 ? [place] DSS Where to apply for food stamps, Medicaid and utility assistance. 336-342-1394 ? [facility]: Transportation to medical appointments. 336-347-2287 ? Social Security Administration: May apply for disability if have a Stage IV cancer. 336-342-7796 1-800-772-1213 ? [place] [facility]ging, Disability and Transit Services: Assists with nutrition, care and transit needs. [phone]         

## 2018-04-22 ENCOUNTER — Telehealth (HOSPITAL_COMMUNITY): Payer: Self-pay

## 2018-04-22 ENCOUNTER — Telehealth (HOSPITAL_COMMUNITY): Payer: Self-pay | Admitting: *Deleted

## 2018-04-22 MED ORDER — ONDANSETRON HCL 4 MG PO TABS: 4.0000 mg | ORAL_TABLET | Freq: Three times a day (TID) | ORAL | 1 refills | Status: DC | PRN

## 2018-04-22 NOTE — Telephone Encounter (Signed)
Pt called stating that she is taking her compazine but it isn't helping. Pt states that she is nauseated more at night. I spoke with Francene Finders NP and she gave a verbal order for zofran 4mg  po every 8 hours as needed for nausea. I called the pt and let her know that We were sending in the Rx for nausea and to let us know if it doesn't help. Pt verbalized understanding.

## 2018-04-22 NOTE — Telephone Encounter (Signed)
24 hr Chemo F/U call. Pt spoke with Venita Lick LPN earlier today regarding her nausea and Zofran was called in but pt has not picked it up yet. Her nausea is actually better today than it was over the weekend and she denies any other issues at this time. Pt instructed to call us if the Zofran is not effective for her nausea and she verbalized understanding

## 2018-04-24 ENCOUNTER — Inpatient Hospital Stay (HOSPITAL_COMMUNITY): Payer: Medicaid Other | Attending: Hematology

## 2018-04-24 ENCOUNTER — Encounter (HOSPITAL_COMMUNITY): Payer: Self-pay

## 2018-04-24 ENCOUNTER — Inpatient Hospital Stay (HOSPITAL_COMMUNITY): Payer: Medicaid Other

## 2018-04-24 VITALS — BP 151/82 | HR 88 | Temp 98.4°F | Resp 18 | Wt 165.6 lb

## 2018-04-24 DIAGNOSIS — M7989 Other specified soft tissue disorders: Secondary | ICD-10-CM | POA: Insufficient documentation

## 2018-04-24 DIAGNOSIS — Z5111 Encounter for antineoplastic chemotherapy: Secondary | ICD-10-CM | POA: Insufficient documentation

## 2018-04-24 DIAGNOSIS — Z23 Encounter for immunization: Secondary | ICD-10-CM | POA: Diagnosis not present

## 2018-04-24 DIAGNOSIS — F4310 Post-traumatic stress disorder, unspecified: Secondary | ICD-10-CM | POA: Insufficient documentation

## 2018-04-24 DIAGNOSIS — Z5112 Encounter for antineoplastic immunotherapy: Secondary | ICD-10-CM | POA: Diagnosis not present

## 2018-04-24 DIAGNOSIS — R109 Unspecified abdominal pain: Secondary | ICD-10-CM | POA: Insufficient documentation

## 2018-04-24 DIAGNOSIS — E860 Dehydration: Secondary | ICD-10-CM | POA: Diagnosis not present

## 2018-04-24 DIAGNOSIS — M8580 Other specified disorders of bone density and structure, unspecified site: Secondary | ICD-10-CM | POA: Insufficient documentation

## 2018-04-24 DIAGNOSIS — C9000 Multiple myeloma not having achieved remission: Secondary | ICD-10-CM | POA: Diagnosis present

## 2018-04-24 DIAGNOSIS — G4700 Insomnia, unspecified: Secondary | ICD-10-CM | POA: Diagnosis not present

## 2018-04-24 DIAGNOSIS — M549 Dorsalgia, unspecified: Secondary | ICD-10-CM | POA: Insufficient documentation

## 2018-04-24 LAB — COMPREHENSIVE METABOLIC PANEL
ALT: 8 U/L (ref 0–44)
ANION GAP: 8 (ref 5–15)
AST: 10 U/L — ABNORMAL LOW (ref 15–41)
Albumin: 2.9 g/dL — ABNORMAL LOW (ref 3.5–5.0)
Alkaline Phosphatase: 66 U/L (ref 38–126)
BUN: 40 mg/dL — ABNORMAL HIGH (ref 8–23)
CHLORIDE: 107 mmol/L (ref 98–111)
CO2: 20 mmol/L — AB (ref 22–32)
Calcium: 7.7 mg/dL — ABNORMAL LOW (ref 8.9–10.3)
Creatinine, Ser: 3.46 mg/dL — ABNORMAL HIGH (ref 0.44–1.00)
GFR calc Af Amer: 15 mL/min — ABNORMAL LOW (ref >60)
GFR calc non Af Amer: 13 mL/min — ABNORMAL LOW (ref >60)
GLUCOSE: 117 mg/dL — AB (ref 70–99)
POTASSIUM: 5.3 mmol/L — AB (ref 3.5–5.1)
SODIUM: 135 mmol/L (ref 135–145)
TOTAL PROTEIN: 9.4 g/dL — AB (ref 6.5–8.1)
Total Bilirubin: 0.5 mg/dL (ref 0.3–1.2)

## 2018-04-24 LAB — CBC WITH DIFFERENTIAL/PLATELET
BASOS ABS: 0 10*3/uL (ref 0.0–0.1)
BASOS PCT: 0 %
EOS ABS: 0.2 10*3/uL (ref 0.0–0.7)
EOS PCT: 4 %
HCT: 24.2 % — ABNORMAL LOW (ref 36.0–46.0)
Hemoglobin: 7.8 g/dL — ABNORMAL LOW (ref 12.0–15.0)
LYMPHS PCT: 18 %
Lymphs Abs: 1 10*3/uL (ref 0.7–4.0)
MCH: 29.8 pg (ref 26.0–34.0)
MCHC: 32.2 g/dL (ref 30.0–36.0)
MCV: 92.4 fL (ref 78.0–100.0)
MONO ABS: 0.3 10*3/uL (ref 0.1–1.0)
MONOS PCT: 6 %
NEUTROS PCT: 72 %
Neutro Abs: 4 10*3/uL (ref 1.7–7.7)
PLATELETS: 270 10*3/uL (ref 150–400)
RBC: 2.62 MIL/uL — ABNORMAL LOW (ref 3.87–5.11)
RDW: 14.1 % (ref 11.5–15.5)
WBC: 5.4 10*3/uL (ref 4.0–10.5)

## 2018-04-24 LAB — ABO/RH: ABO/RH(D): A NEG

## 2018-04-24 LAB — PREPARE RBC (CROSSMATCH)

## 2018-04-24 MED ORDER — SODIUM CHLORIDE 0.9 % IV SOLN
Freq: Once | INTRAVENOUS | Status: AC
  Administered 2018-04-24: 12:00:00 via INTRAVENOUS

## 2018-04-24 MED ORDER — SODIUM CHLORIDE 0.9% FLUSH
10.0000 mL | INTRAVENOUS | Status: DC | PRN
  Administered 2018-04-24: 10 mL
  Filled 2018-04-24: qty 10

## 2018-04-24 MED ORDER — SODIUM CHLORIDE 0.9 % IV SOLN
10.0000 mg | Freq: Once | INTRAVENOUS | Status: AC
  Administered 2018-04-24: 10 mg via INTRAVENOUS
  Filled 2018-04-24: qty 1

## 2018-04-24 MED ORDER — TEMAZEPAM 15 MG PO CAPS: 15.0000 mg | ORAL_CAPSULE | Freq: Every evening | ORAL | 0 refills | Status: DC | PRN

## 2018-04-24 MED ORDER — ALLOPURINOL 300 MG PO TABS: 300.0000 mg | ORAL_TABLET | Freq: Every day | ORAL | 1 refills | Status: DC

## 2018-04-24 MED ORDER — SODIUM CHLORIDE 0.9 % IV SOLN
INTRAVENOUS | Status: AC
  Administered 2018-04-24: 09:00:00 via INTRAVENOUS

## 2018-04-24 MED ORDER — DIPHENHYDRAMINE HCL 25 MG PO CAPS
25.0000 mg | ORAL_CAPSULE | Freq: Once | ORAL | Status: AC
  Administered 2018-04-24: 25 mg via ORAL
  Filled 2018-04-24: qty 1

## 2018-04-24 MED ORDER — SODIUM CHLORIDE 0.9 % IV SOLN
300.0000 mg/m2 | Freq: Once | INTRAVENOUS | Status: AC
  Administered 2018-04-24: 540 mg via INTRAVENOUS
  Filled 2018-04-24: qty 27

## 2018-04-24 MED ORDER — PALONOSETRON HCL INJECTION 0.25 MG/5ML
0.2500 mg | Freq: Once | INTRAVENOUS | Status: AC
  Administered 2018-04-24: 0.25 mg via INTRAVENOUS
  Filled 2018-04-24: qty 5

## 2018-04-24 MED ORDER — HEPARIN SOD (PORK) LOCK FLUSH 100 UNIT/ML IV SOLN: 500.0000 [IU] | Freq: Every day | INTRAVENOUS | Status: DC | PRN

## 2018-04-24 MED ORDER — HEPARIN SOD (PORK) LOCK FLUSH 100 UNIT/ML IV SOLN
INTRAVENOUS | Status: AC
  Filled 2018-04-24: qty 5

## 2018-04-24 MED ORDER — SODIUM CHLORIDE 0.9 % IV SOLN: Freq: Once | INTRAVENOUS | Status: DC

## 2018-04-24 MED ORDER — SODIUM CHLORIDE 0.9% IV SOLUTION
250.0000 mL | Freq: Once | INTRAVENOUS | Status: AC
  Administered 2018-04-24: 250 mL via INTRAVENOUS

## 2018-04-24 MED ORDER — DEXTROSE 5 % IV SOLN
60.0000 mg | Freq: Once | INTRAVENOUS | Status: AC
  Administered 2018-04-24: 60 mg via INTRAVENOUS
  Filled 2018-04-24: qty 30

## 2018-04-24 MED ORDER — ACETAMINOPHEN 325 MG PO TABS
650.0000 mg | ORAL_TABLET | Freq: Once | ORAL | Status: AC
  Administered 2018-04-24: 650 mg via ORAL
  Filled 2018-04-24: qty 2

## 2018-04-24 NOTE — Patient Instructions (Addendum)
[place] at [facility]  Discharge Instructions:  Today you received hydration as well as 1 unit of blood and Kyprolis and Cytoxan infusions.  . Follow up as scheduled. Call clinic for any questions or concerns. _______________________________________________________________  Thank you for choosing [place] at [facility] to provide your oncology and hematology care.  To afford each patient quality time with our providers, please arrive at least 15 minutes before your scheduled appointment.  You need to re-schedule your appointment if you arrive 10 or more minutes late.  We strive to give you quality time with our providers, and arriving late affects you and other patients whose appointments are after yours.  Also, if you no show three or more times for appointments you may be dismissed from the clinic.  Again, thank you for choosing [place] at [place] hope is that these requests will allow you access to exceptional care and in a timely manner. _______________________________________________________________  If you have questions after your visit, please contact our office at [phone] between the hours of 8:30 a.m. and 5:00 p.m. Voicemails left after 4:30 p.m. will not be returned until the following business day. _______________________________________________________________  For prescription refill requests, have your pharmacy contact our office. _______________________________________________________________  Recommendations made by the consultant and any test results will be sent to your referring physician. _______________________________________________________________

## 2018-04-24 NOTE — Progress Notes (Signed)
0900 Labs reviewed with Dr. Delton Coombes and orders obtained for hydration with NS 1L over 2 hrs per MD                                                                1005 Pt approved for chemo tx today with one unit of blood ordered as well as Allopurinol 300 mg 1 PO daily and Restoril 15 mg prn at bedtime since the Ambien is not effective per MD                                              Alexandra Price tolerated blood transfusion and chemo tx well without complaints or incident. Portacath saline locked and flushed for use tomorrow. VSS upon discharge. Pt instructed to start taking her Allopurinol this afternoon and verbalized understanding. Pt discharged via wheelchair in satisfactory condition accompanied by family members

## 2018-04-25 ENCOUNTER — Inpatient Hospital Stay (HOSPITAL_COMMUNITY): Payer: Medicaid Other

## 2018-04-25 ENCOUNTER — Encounter (HOSPITAL_COMMUNITY): Payer: Self-pay

## 2018-04-25 VITALS — BP 155/78 | HR 76 | Temp 98.3°F | Resp 18

## 2018-04-25 DIAGNOSIS — Z5112 Encounter for antineoplastic immunotherapy: Secondary | ICD-10-CM | POA: Diagnosis not present

## 2018-04-25 DIAGNOSIS — C9000 Multiple myeloma not having achieved remission: Secondary | ICD-10-CM

## 2018-04-25 DIAGNOSIS — K5900 Constipation, unspecified: Secondary | ICD-10-CM

## 2018-04-25 LAB — TYPE AND SCREEN
ABO/RH(D): A NEG
ANTIBODY SCREEN: NEGATIVE
Unit division: 0

## 2018-04-25 LAB — BASIC METABOLIC PANEL
Anion gap: 8 (ref 5–15)
BUN: 43 mg/dL — ABNORMAL HIGH (ref 8–23)
CALCIUM: 6.9 mg/dL — AB (ref 8.9–10.3)
CO2: 17 mmol/L — ABNORMAL LOW (ref 22–32)
CREATININE: 3.31 mg/dL — AB (ref 0.44–1.00)
Chloride: 110 mmol/L (ref 98–111)
GFR, EST AFRICAN AMERICAN: 15 mL/min — AB (ref >60)
GFR, EST NON AFRICAN AMERICAN: 13 mL/min — AB (ref >60)
Glucose, Bld: 147 mg/dL — ABNORMAL HIGH (ref 70–99)
Potassium: 4.6 mmol/L (ref 3.5–5.1)
Sodium: 135 mmol/L (ref 135–145)

## 2018-04-25 LAB — LACTATE DEHYDROGENASE: LDH: 204 U/L — AB (ref 98–192)

## 2018-04-25 LAB — BPAM RBC
Blood Product Expiration Date: 201910062359
ISSUE DATE / TIME: 201909051259
Unit Type and Rh: 600

## 2018-04-25 LAB — MAGNESIUM: Magnesium: 1.8 mg/dL (ref 1.7–2.4)

## 2018-04-25 LAB — PHOSPHORUS: Phosphorus: 2.1 mg/dL — ABNORMAL LOW (ref 2.5–4.6)

## 2018-04-25 MED ORDER — HEPARIN SOD (PORK) LOCK FLUSH 100 UNIT/ML IV SOLN
500.0000 [IU] | Freq: Once | INTRAVENOUS | Status: AC
  Administered 2018-04-25: 500 [IU] via INTRAVENOUS

## 2018-04-25 MED ORDER — SODIUM CHLORIDE 0.9 % IV SOLN
Freq: Once | INTRAVENOUS | Status: AC
  Administered 2018-04-25: 09:00:00 via INTRAVENOUS

## 2018-04-25 MED ORDER — SODIUM CHLORIDE 0.9% FLUSH
10.0000 mL | INTRAVENOUS | Status: DC | PRN
  Administered 2018-04-25: 10 mL
  Filled 2018-04-25: qty 10

## 2018-04-25 MED ORDER — LACTULOSE 10 GM/15ML PO SOLN: ORAL | 0 refills | Status: DC

## 2018-04-25 MED ORDER — SODIUM CHLORIDE 0.9 % IV SOLN
10.0000 mg | Freq: Once | INTRAVENOUS | Status: AC
  Administered 2018-04-25: 10 mg via INTRAVENOUS
  Filled 2018-04-25: qty 1

## 2018-04-25 MED ORDER — SODIUM CHLORIDE 0.9 % IV SOLN
Freq: Once | INTRAVENOUS | Status: AC
  Administered 2018-04-25: 10:00:00 via INTRAVENOUS

## 2018-04-25 MED ORDER — DEXTROSE 5 % IV SOLN
33.0000 mg/m2 | Freq: Once | INTRAVENOUS | Status: AC
  Administered 2018-04-25: 60 mg via INTRAVENOUS
  Filled 2018-04-25: qty 30

## 2018-04-25 NOTE — Progress Notes (Signed)
Labs reviewed with Dr. Delton Coombes.  Hydrate with one liter of NACL total for today verbal order Dr. Delton Coombes.   Ok to treat today verbal order Dr. Delton Coombes.   Patient stated she has not had a bowel movement in four days.  Reviewed colace directions and information that was given for management of constipation when her treatments started.  Reviewed with Dr. Delton Coombes and ok to send script for lactulose.  Patient verbalized understanding with instructions for lactulose.   Patient also scheduled for labs on Monday, review of constipation, and visit with Dr. Walden Field verbal order Dr. Delton Coombes.  Patient and family verbalized understanding.    Patient tolerated therapy with no complaints voiced. Patient used the bathroom before discharge with "good bowel movement" per patient and family.  Patient instructed to keep her appointment on Monday.  Port site clean and dry with no bruising or swelling noted at site.  Good blood return noted with flush.  Skin glue intact.  Band aid applied.  VSS with discharge and left by wheelchair with family and no s/s of distress noted.

## 2018-04-25 NOTE — Patient Instructions (Signed)
[place] Discharge Instructions for Patients Receiving Chemotherapy  Today you received the following chemotherapy agents kyprolis.    If you develop nausea and vomiting that is not controlled by your nausea medication, call the clinic.   BELOW ARE SYMPTOMS THAT SHOULD BE REPORTED IMMEDIATELY:  *FEVER GREATER THAN 100.5 F  *CHILLS WITH OR WITHOUT FEVER  NAUSEA AND VOMITING THAT IS NOT CONTROLLED WITH YOUR NAUSEA MEDICATION  *UNUSUAL SHORTNESS OF BREATH  *UNUSUAL BRUISING OR BLEEDING  TENDERNESS IN MOUTH AND THROAT WITH OR WITHOUT PRESENCE OF ULCERS  *URINARY PROBLEMS  *BOWEL PROBLEMS  UNUSUAL RASH Items with * indicate a potential emergency and should be followed up as soon as possible.  Feel free to call the clinic should you have any questions or concerns. The clinic phone number is [phone].  Please show the [name] at check-in to the Emergency Department and triage nurse.

## 2018-04-27 ENCOUNTER — Emergency Department (HOSPITAL_COMMUNITY): Payer: Medicaid Other

## 2018-04-27 ENCOUNTER — Emergency Department (HOSPITAL_COMMUNITY)
Admission: EM | Admit: 2018-04-27 | Discharge: 2018-04-27 | Disposition: A | Discharge status: Home / Self Care | Payer: Medicaid Other | Attending: Emergency Medicine | Admitting: Emergency Medicine

## 2018-04-27 ENCOUNTER — Encounter (HOSPITAL_COMMUNITY): Payer: Self-pay

## 2018-04-27 DIAGNOSIS — R7989 Other specified abnormal findings of blood chemistry: Secondary | ICD-10-CM

## 2018-04-27 DIAGNOSIS — Z79899 Other long term (current) drug therapy: Secondary | ICD-10-CM | POA: Insufficient documentation

## 2018-04-27 DIAGNOSIS — D649 Anemia, unspecified: Secondary | ICD-10-CM | POA: Insufficient documentation

## 2018-04-27 DIAGNOSIS — R0789 Other chest pain: Secondary | ICD-10-CM | POA: Diagnosis not present

## 2018-04-27 DIAGNOSIS — R110 Nausea: Secondary | ICD-10-CM

## 2018-04-27 DIAGNOSIS — R531 Weakness: Secondary | ICD-10-CM | POA: Diagnosis present

## 2018-04-27 DIAGNOSIS — Z7982 Long term (current) use of aspirin: Secondary | ICD-10-CM | POA: Insufficient documentation

## 2018-04-27 DIAGNOSIS — C9000 Multiple myeloma not having achieved remission: Secondary | ICD-10-CM | POA: Insufficient documentation

## 2018-04-27 LAB — COMPREHENSIVE METABOLIC PANEL
ALT: 12 U/L (ref 0–44)
AST: 13 U/L — AB (ref 15–41)
Albumin: 3 g/dL — ABNORMAL LOW (ref 3.5–5.0)
Alkaline Phosphatase: 76 U/L (ref 38–126)
Anion gap: 8 (ref 5–15)
BILIRUBIN TOTAL: 0.6 mg/dL (ref 0.3–1.2)
BUN: 42 mg/dL — ABNORMAL HIGH (ref 8–23)
CALCIUM: 6.7 mg/dL — AB (ref 8.9–10.3)
CHLORIDE: 110 mmol/L (ref 98–111)
CO2: 18 mmol/L — ABNORMAL LOW (ref 22–32)
CREATININE: 3.17 mg/dL — AB (ref 0.44–1.00)
GFR calc Af Amer: 16 mL/min — ABNORMAL LOW (ref >60)
GFR, EST NON AFRICAN AMERICAN: 14 mL/min — AB (ref >60)
Glucose, Bld: 98 mg/dL (ref 70–99)
POTASSIUM: 4.6 mmol/L (ref 3.5–5.1)
Sodium: 136 mmol/L (ref 135–145)
Total Protein: 9.5 g/dL — ABNORMAL HIGH (ref 6.5–8.1)

## 2018-04-27 LAB — CBC WITH DIFFERENTIAL/PLATELET
BASOS ABS: 0 10*3/uL (ref 0.0–0.1)
Basophils Relative: 0 %
EOS PCT: 0 %
Eosinophils Absolute: 0 10*3/uL (ref 0.0–0.7)
HEMATOCRIT: 29.4 % — AB (ref 36.0–46.0)
Hemoglobin: 9.7 g/dL — ABNORMAL LOW (ref 12.0–15.0)
LYMPHS PCT: 24 %
Lymphs Abs: 1.1 10*3/uL (ref 0.7–4.0)
MCH: 30.4 pg (ref 26.0–34.0)
MCHC: 33 g/dL (ref 30.0–36.0)
MCV: 92.2 fL (ref 78.0–100.0)
MONO ABS: 0.5 10*3/uL (ref 0.1–1.0)
Monocytes Relative: 12 %
Neutro Abs: 3 10*3/uL (ref 1.7–7.7)
Neutrophils Relative %: 64 %
PLATELETS: 210 10*3/uL (ref 150–400)
RBC: 3.19 MIL/uL — ABNORMAL LOW (ref 3.87–5.11)
RDW: 14.1 % (ref 11.5–15.5)
WBC: 4.6 10*3/uL (ref 4.0–10.5)

## 2018-04-27 LAB — URINALYSIS, ROUTINE W REFLEX MICROSCOPIC
Bilirubin Urine: NEGATIVE
Glucose, UA: NEGATIVE mg/dL
Ketones, ur: NEGATIVE mg/dL
Leukocytes, UA: NEGATIVE
Nitrite: NEGATIVE
Protein, ur: NEGATIVE mg/dL
SPECIFIC GRAVITY, URINE: 1.005 (ref 1.005–1.030)
pH: 5 (ref 5.0–8.0)

## 2018-04-27 LAB — LIPASE, BLOOD: Lipase: 65 U/L — ABNORMAL HIGH (ref 11–51)

## 2018-04-27 MED ORDER — HYDROMORPHONE HCL 1 MG/ML IJ SOLN
0.5000 mg | Freq: Once | INTRAMUSCULAR | Status: AC
  Administered 2018-04-27: 0.5 mg via INTRAVENOUS
  Filled 2018-04-27: qty 1

## 2018-04-27 MED ORDER — PROMETHAZINE HCL 25 MG PO TABS: 25.0000 mg | ORAL_TABLET | Freq: Four times a day (QID) | ORAL | 0 refills | Status: DC | PRN

## 2018-04-27 MED ORDER — SODIUM CHLORIDE 0.9 % IV BOLUS
1000.0000 mL | Freq: Once | INTRAVENOUS | Status: AC
  Administered 2018-04-27: 1000 mL via INTRAVENOUS

## 2018-04-27 MED ORDER — PROMETHAZINE HCL 25 MG/ML IJ SOLN
12.5000 mg | Freq: Once | INTRAMUSCULAR | Status: AC
  Administered 2018-04-27: 12.5 mg via INTRAVENOUS
  Filled 2018-04-27: qty 1

## 2018-04-27 MED ORDER — SODIUM CHLORIDE 0.9 % IV BOLUS
500.0000 mL | Freq: Once | INTRAVENOUS | Status: AC
  Administered 2018-04-27: 500 mL via INTRAVENOUS

## 2018-04-27 MED ORDER — FAMOTIDINE IN NACL 20-0.9 MG/50ML-% IV SOLN
20.0000 mg | Freq: Once | INTRAVENOUS | Status: AC
  Administered 2018-04-27: 20 mg via INTRAVENOUS
  Filled 2018-04-27: qty 50

## 2018-04-27 MED ORDER — FENTANYL CITRATE (PF) 100 MCG/2ML IJ SOLN
50.0000 ug | Freq: Once | INTRAMUSCULAR | Status: AC
  Administered 2018-04-27: 50 ug via INTRAVENOUS
  Filled 2018-04-27: qty 2

## 2018-04-27 MED ORDER — PANTOPRAZOLE SODIUM 40 MG PO TBEC: 40.0000 mg | DELAYED_RELEASE_TABLET | Freq: Every day | ORAL | 0 refills | Status: DC

## 2018-04-27 MED ORDER — ONDANSETRON HCL 4 MG/2ML IJ SOLN
4.0000 mg | Freq: Once | INTRAMUSCULAR | Status: AC
  Administered 2018-04-27: 4 mg via INTRAVENOUS
  Filled 2018-04-27: qty 2

## 2018-04-27 NOTE — ED Provider Notes (Signed)
[facility] EMERGENCY DEPARTMENT Provider Note   CSN: [id number] Arrival date & time: 04/27/18  1257     History   Chief Complaint Chief Complaint  Patient presents with  . Nausea    HPI Alexandra Price is a 72 y.o. female.  Weakness, nausea, decreased appetite after chemotherapy on Friday for multiple myeloma.  Her right inferior lateral ribs hurt secondary to "dry heaves".  No substernal chest pain, dyspnea, fever, sweats, chills, dysuria.  Status post blood transfusion past Thursday.  Severity of symptoms is moderate.  Nothing makes symptoms better or worse.     Past Medical History:  Diagnosis Date  . Multiple myeloma ([place])    multiple myeloma    Patient Active Problem List   Diagnosis Date Noted  . Multiple myeloma without remission ([place]) 04/15/2018  . Goals of care, counseling/discussion 04/15/2018  . Multiple myeloma not having achieved remission ([place]) 04/07/2018    Past Surgical History:  Procedure Laterality Date  . ABDOMINAL HYSTERECTOMY     total  . PORTACATH PLACEMENT Right 04/14/2018   Procedure: INSERTION PORT-A-CATH;  Surgeon: [name], MD;  Location: [facility];  Service: General;  Laterality: Right;     OB History   None      Home Medications    Prior to Admission medications   Medication Sig Start Date End Date Taking? Authorizing Provider  acyclovir (ZOVIRAX) 400 MG tablet Take 1 tablet (400 mg total) by mouth daily. 04/15/18  Yes [name], MD  allopurinol (ZYLOPRIM) 300 MG tablet Take 1 tablet (300 mg total) by mouth daily. 04/24/18  Yes [name], MD  ASPIRIN 81 PO Take 81 mg by mouth daily.   Yes [provider]  dexamethasone (DECADRON) 4 MG tablet Take 5 tablets (20 mg) on days 1, 2, 8, 9, 15 and 16 on the days of chemotherapy.  Take 10 tablets during the week of no chemotherapy. 04/15/18  Yes [name], MD  HYDROcodone-acetaminophen (NORCO) 5-325 MG tablet Take 1 tablet by mouth every 6 (six)  hours as needed for moderate pain. 04/15/18  Yes Lockamy, Randi L, NP-C  lactulose (CHRONULAC) 10 GM/15ML solution Take 15 to 30 ml at bedtime every night to assist with moving bowels.  Titrate down if having multiple bowel movements.  If bowels hav not moved in 3 to 4 days or longer-may give 15 to 30 ml every 2 hours until bowlel movement then resume taking 15 to 30 ml every night. 04/25/18  Yes [name], MD  lenalidomide (REVLIMID) 10 MG capsule Take 1 capsule (10 mg total) by mouth daily. 04/15/18  Yes [name], MD  lidocaine-prilocaine (EMLA) cream Apply to affected area once 04/15/18  Yes [name], MD  lisinopril (PRINIVIL,ZESTRIL) 10 MG tablet Take 1 tablet (10 mg total) by mouth daily. 04/10/18  Yes Nat Christen, MD  ondansetron (ZOFRAN) 4 MG tablet Take 1 tablet (4 mg total) by mouth every 8 (eight) hours as needed for nausea or vomiting. 04/22/18  Yes Lockamy, Randi L, NP-C  prochlorperazine (COMPAZINE) 10 MG tablet Take 1 tablet (10 mg total) by mouth every 6 (six) hours as needed (Nausea or vomiting). 04/15/18  Yes [name], MD  temazepam (RESTORIL) 15 MG capsule Take 1 capsule (15 mg total) by mouth at bedtime as needed for sleep. 04/24/18  Yes Lockamy, Randi L, NP-C  traMADol (ULTRAM) 50 MG tablet Take 1 tablet (50 mg total) by mouth every 6 (six) hours as needed. 04/15/18  Yes Lockamy, Randi L,  NP-C  trolamine salicylate (ASPERCREME) 10 % cream Apply 1 application topically daily as needed for muscle pain.   Yes [provider]  Carfilzomib (KYPROLIS IV) Inject into the vein.    [provider]  cyclophosphamide in sodium chloride 0.9 % 250 mL Inject into the vein once.    [provider]  pantoprazole (PROTONIX) 40 MG tablet Take 1 tablet (40 mg total) by mouth daily. 04/27/18   Nat Christen, MD  promethazine (PHENERGAN) 25 MG tablet Take 1 tablet (25 mg total) by mouth every 6 (six) hours as needed. 04/27/18   Nat Christen, MD    zolpidem (AMBIEN) 10 MG tablet Take 1 tablet (10 mg total) by mouth at bedtime as needed for sleep. Patient taking differently: Take 10 mg by mouth at bedtime.  04/07/18 05/07/18  [place]ie [place], NP-C    Family History Family History  Problem Relation Age of Onset  . Heart disease Mother   . Emphysema Father   . Diabetes Sister   . Cancer Brother        liver, lung, and colon  . Diabetes Brother     Social History Social History   Tobacco Use  . Smoking status: Never Smoker  . Smokeless tobacco: Never Used  Substance Use Topics  . Alcohol use: Never    Frequency: Never  . Drug use: Not Currently     Allergies   Morphine and related   Review of Systems Review of Systems  All other systems reviewed and are negative.    Physical Exam Updated Vital Signs BP 140/75   Pulse 71   Temp 98.5 F (36.9 C) (Axillary)   Resp 18   SpO2 94%   Physical Exam  Constitutional: She is oriented to person, place, and time.  No acute distress  HENT:  Head: Normocephalic and atraumatic.  Eyes: Conjunctivae are normal.  Neck: Neck supple.  Cardiovascular: Normal rate and regular rhythm.  Pulmonary/Chest: Effort normal and breath sounds normal.  Min tenderness right ant lat chest wall  Abdominal: Soft. Bowel sounds are normal.  Musculoskeletal: Normal range of motion.  Neurological: She is alert and oriented to person, place, and time.  Skin: Skin is warm and dry.  Psychiatric: She has a normal mood and affect. Her behavior is normal.  Nursing note and vitals reviewed.    ED Treatments / Results  Labs (all labs ordered are listed, but only abnormal results are displayed) Labs Reviewed  CBC WITH DIFFERENTIAL/PLATELET - Abnormal; Notable for the following components:      Result Value   RBC 3.19 (*)    Hemoglobin 9.7 (*)    HCT 29.4 (*)    All other components within normal limits  COMPREHENSIVE METABOLIC PANEL - Abnormal; Notable for the following components:   CO2  18 (*)    BUN 42 (*)    Creatinine, Ser 3.17 (*)    Calcium 6.7 (*)    Total Protein 9.5 (*)    Albumin 3.0 (*)    AST 13 (*)    GFR calc non Af Amer 14 (*)    GFR calc Af Amer 16 (*)    All other components within normal limits  URINALYSIS, ROUTINE W REFLEX MICROSCOPIC - Abnormal; Notable for the following components:   Color, Urine COLORLESS (*)    Hgb urine dipstick SMALL (*)    Bacteria, UA RARE (*)    All other components within normal limits  LIPASE, BLOOD - Abnormal; Notable for  the following components:   Lipase 65 (*)    All other components within normal limits  TYPE AND SCREEN    EKG None  Radiology Dg Ribs Unilateral W/chest Right  Result Date: 04/27/2018 CLINICAL DATA:  Right rib pain.  Myeloma.  Weakness. EXAM: RIGHT RIBS AND CHEST - 3+ VIEW COMPARISON:  04/14/2018 FINDINGS: Extensive lytic lesions throughout the skeleton compatible with diffuse myeloma. Expansion in lytic destruction of the left distal clavicle is especially pronounced. Underlying bony demineralization. There is some right anterior rib deformities compatible with age-indeterminate fractures. For example, there is apparent discontinuity of the right anterior fifth rib on 1 of the projections. Moderate compression fracture at T11. There also some left upper rib deformities compatible with age-indeterminate fractures. Mild enlargement of the cardiopericardial silhouette. Upper zone pulmonary vascular prominence. IMPRESSION: 1. Age indeterminate bilateral rib fractures. 2. Extensive widespread myeloma throughout the skeleton with associated lytic lesions. Lytic destruction of the left distal clavicle. 3. Cardiomegaly and pulmonary venous hypertension. Electronically Signed   By: Van Clines M.D.   On: 04/27/2018 15:26    Procedures Procedures (including critical care time)  Medications Ordered in ED Medications  sodium chloride 0.9 % bolus 1,000 mL (0 mLs Intravenous Stopped 04/27/18 1601)  fentaNYL  (SUBLIMAZE) injection 50 mcg (50 mcg Intravenous Given 04/27/18 1447)  ondansetron (ZOFRAN) injection 4 mg (4 mg Intravenous Given 04/27/18 1447)  sodium chloride 0.9 % bolus 1,000 mL (0 mLs Intravenous Stopped 04/27/18 1550)  HYDROmorphone (DILAUDID) injection 0.5 mg (0.5 mg Intravenous Given 04/27/18 1632)  promethazine (PHENERGAN) injection 12.5 mg (12.5 mg Intravenous Given 04/27/18 1632)  famotidine (PEPCID) IVPB 20 mg premix (0 mg Intravenous Stopped 04/27/18 1705)  sodium chloride 0.9 % bolus 500 mL (500 mLs Intravenous New Bag/Given 04/27/18 1812)  HYDROmorphone (DILAUDID) injection 0.5 mg (0.5 mg Intravenous Given 04/27/18 1810)  promethazine (PHENERGAN) injection 12.5 mg (12.5 mg Intravenous Given 04/27/18 1809)     Initial Impression / Assessment and Plan / ED Course  I have reviewed the triage vital signs and the nursing notes.  Pertinent labs & imaging results that were available during my care of the patient were reviewed by me and considered in my medical decision making (see chart for details).     Patient presents with weakness, nausea, malaise after chemotherapy for multiple myeloma.  Screening tests reveal anemia (hemoglobin 9.7), elevated creatinine (3.17; not new), minimally elevated lipase (65).  Urinalysis and chest x-ray show no obvious infection.  There are rib fractures of indeterminate age and lytic lesions suggestive of multiple myeloma.  Patient was hydrated with IV fluids, IV pain medicine, IV antiemetics.  Discharge medications Phenergan 25 mg and Protonix 40 mg.  She will follow-up with her oncologist this week. Final Clinical Impressions(s) / ED Diagnoses   Final diagnoses:  Multiple myeloma, remission status unspecified ([place])  Anemia, unspecified type  Chest wall pain  Nausea  Elevated serum creatinine    ED Discharge Orders         Ordered    promethazine (PHENERGAN) 25 MG tablet  Every 6 hours PRN     04/27/18 1901    pantoprazole (PROTONIX) 40 MG tablet  Daily       04/27/18 1901           Nat Christen, MD 04/27/18 1920

## 2018-04-27 NOTE — ED Triage Notes (Addendum)
Pt is currently on chemo for multiple myeloma. Last tx Friday. Pt reports increase nausea, decrease appetite, dry heaves and now has pain lateral ribs, as well as weakness. Reports blood transfusion last thursday

## 2018-04-27 NOTE — Discharge Instructions (Addendum)
X-rays showed rib fractures of indeterminate age.  You are also anemic (not new) and your creatinine (kidney function) is elevated.  Prescription for nausea medicine and stomach irritation.  Follow-up with your doctors this week.

## 2018-04-28 ENCOUNTER — Inpatient Hospital Stay (HOSPITAL_BASED_OUTPATIENT_CLINIC_OR_DEPARTMENT_OTHER): Payer: Medicaid Other | Admitting: Internal Medicine

## 2018-04-28 ENCOUNTER — Encounter (HOSPITAL_COMMUNITY): Payer: Self-pay | Admitting: Internal Medicine

## 2018-04-28 ENCOUNTER — Ambulatory Visit (HOSPITAL_COMMUNITY)
Admission: RE | Admit: 2018-04-28 | Discharge: 2018-04-28 | Disposition: A | Discharge status: Home / Self Care | Payer: Medicaid Other | Source: Ambulatory Visit | Attending: Internal Medicine | Admitting: Internal Medicine

## 2018-04-28 ENCOUNTER — Other Ambulatory Visit (HOSPITAL_COMMUNITY): Payer: Self-pay

## 2018-04-28 ENCOUNTER — Other Ambulatory Visit (HOSPITAL_COMMUNITY): Payer: Self-pay | Admitting: *Deleted

## 2018-04-28 ENCOUNTER — Inpatient Hospital Stay (HOSPITAL_COMMUNITY): Payer: Medicaid Other

## 2018-04-28 ENCOUNTER — Telehealth (HOSPITAL_COMMUNITY): Payer: Self-pay | Admitting: Hematology

## 2018-04-28 VITALS — BP 167/70 | HR 75 | Temp 98.6°F | Resp 18 | Wt 167.0 lb

## 2018-04-28 DIAGNOSIS — Z5112 Encounter for antineoplastic immunotherapy: Secondary | ICD-10-CM | POA: Diagnosis not present

## 2018-04-28 DIAGNOSIS — C9000 Multiple myeloma not having achieved remission: Secondary | ICD-10-CM | POA: Diagnosis not present

## 2018-04-28 DIAGNOSIS — M7989 Other specified soft tissue disorders: Secondary | ICD-10-CM

## 2018-04-28 DIAGNOSIS — G4700 Insomnia, unspecified: Secondary | ICD-10-CM

## 2018-04-28 DIAGNOSIS — M8580 Other specified disorders of bone density and structure, unspecified site: Secondary | ICD-10-CM

## 2018-04-28 DIAGNOSIS — F4310 Post-traumatic stress disorder, unspecified: Secondary | ICD-10-CM

## 2018-04-28 DIAGNOSIS — R109 Unspecified abdominal pain: Secondary | ICD-10-CM

## 2018-04-28 MED ORDER — FAMOTIDINE IN NACL 20-0.9 MG/50ML-% IV SOLN
INTRAVENOUS | Status: AC
  Filled 2018-04-28: qty 50

## 2018-04-28 MED ORDER — ESZOPICLONE 1 MG PO TABS: 1.0000 mg | ORAL_TABLET | Freq: Every evening | ORAL | 0 refills | Status: DC | PRN

## 2018-04-28 MED ORDER — DIPHENHYDRAMINE HCL 50 MG/ML IJ SOLN
INTRAMUSCULAR | Status: AC
  Filled 2018-04-28: qty 1

## 2018-04-28 MED ORDER — SODIUM CHLORIDE 0.9% FLUSH
10.0000 mL | INTRAVENOUS | Status: DC | PRN
  Administered 2018-04-28: 10 mL via INTRAVENOUS
  Filled 2018-04-28: qty 10

## 2018-04-28 MED ORDER — FAMOTIDINE IN NACL 20-0.9 MG/50ML-% IV SOLN
20.0000 mg | Freq: Once | INTRAVENOUS | Status: AC
  Administered 2018-04-28: 20 mg via INTRAVENOUS

## 2018-04-28 MED ORDER — DIPHENHYDRAMINE HCL 50 MG/ML IJ SOLN
25.0000 mg | Freq: Once | INTRAMUSCULAR | Status: AC
  Administered 2018-04-28: 25 mg via INTRAVENOUS

## 2018-04-28 MED ORDER — SODIUM CHLORIDE 0.9 % IV SOLN
INTRAVENOUS | Status: DC
  Administered 2018-04-28: 13:00:00 via INTRAVENOUS

## 2018-04-28 MED ORDER — HEPARIN SOD (PORK) LOCK FLUSH 100 UNIT/ML IV SOLN
500.0000 [IU] | Freq: Once | INTRAVENOUS | Status: AC
  Administered 2018-04-28: 500 [IU] via INTRAVENOUS

## 2018-04-28 NOTE — Patient Instructions (Signed)
[place] at [facility] Discharge Instructions  Received hydration,benadryl and Pepcid today. Follow-up as scheduled. Call clinic for any questions or concerns   Thank you for choosing [place] at [facility] to provide your oncology and hematology care.  To afford each patient quality time with our provider, please arrive at least 15 minutes before your scheduled appointment time.   If you have a lab appointment with the [place] please come in thru the  Main Entrance and check in at the main information desk  You need to re-schedule your appointment should you arrive 10 or more minutes late.  We strive to give you quality time with our providers, and arriving late affects you and other patients whose appointments are after yours.  Also, if you no show three or more times for appointments you may be dismissed from the clinic at the providers discretion.     Again, thank you for choosing [facility].  Our hope is that these requests will decrease the amount of time that you wait before being seen by our physicians.       _____________________________________________________________  Should you have questions after your visit to [facility], please contact our office at [phone] between the hours of 8:00 a.m. and 4:30 p.m.  Voicemails left after 4:00 p.m. will not be returned until the following business day.  For prescription refill requests, have your pharmacy contact our office and allow 72 hours.    Cancer Center Support Programs:   > Cancer Support Group  2nd Tuesday of the month 1pm-2pm, Journey Room

## 2018-04-28 NOTE — Progress Notes (Signed)
45 Pt returned to clinic after recent MD appt with Dr. Walden Field with complaints of left arm and hand swelling that started while she was at the pharmacy waiting for her prescriptions to be filled.She reported having to remove her rings because her fingers began to swell. Pt also reports H/O left shoulder pain and upper abdominal discomfort. Dr. Walden Field made aware and ordered hydration with NS 250 ml over 1 hr, Benadryl 25 mg IV, Pepcid 20 mg IV and doppler study of left arm.                                                       Alexandra Price tolerated hydration with Pepcid and Benadryl well. VSS upon discharge. Pt continues to complain of pain in left shoulder down her arm with mild puffiness noted to fingers. Pt discharged via wheelchair to go to Radiology for doppler study of left arm. Pt accompanied by caregiver Pt can leave after this study is completed and we will call for any issues noted on the doppler per Dr. Walden Field. Pt verbalized understanding

## 2018-04-28 NOTE — Telephone Encounter (Signed)
Alleen Kehm DOB Feb 23, 1946 is approved for our Patient Assistance Program for 2019, approval expires 08/19/2018. You should have received an approval letter via fax.   The prescription can be sent to our Patient Assistance Program Pharmacy:  [facility] [place] Fax: [phone] Phone: [phone] [place]: Our pharmacy belongs to the [facility] network, and can be found within your EMR using the following information: Pharmacy Name: [facility] [place] Address: [address] [place] [address] [place] Number: [phone]

## 2018-04-28 NOTE — Progress Notes (Signed)
Patients son called concerning mothers anxiety.  Note sent to Edwyna Shell, SW, regarding the sons concerns for his mom.  Reviewed with Dr. Walden Field and agreed with note sent to the social worker.

## 2018-04-28 NOTE — Progress Notes (Signed)
Diagnosis Left arm swelling - Plan: US Venous Img Upper Uni Left  Staging Cancer Staging No matching staging information was found for the patient.  Assessment and Plan:  Multiple myeloma without remission (HCC) 1.  IgG lambda plasma cell myeloma, stage II by R-ISS: - Presentation to [facility] with left-sided rib pain on 12/29/2017, found to have hypercalcemia, anemia and acute kidney injury, further work-up showed 3.2 g/dL of IgG lambda monoclonal gammopathy. -Skeletal survey reportedly demonstrated diffuse osteopenia and multiple lytic lesions in bilateral lower extremities. -Bone marrow biopsy on 12/31/2017 showed hypercellular marrow with 40 to 50% cellularity, involved by plasma cell neoplasm (17% plasma cells by manual aspirate differential count, greater than 90% and 30 to 40% by CD138 immunohistochemical analysis of clot and core respectively, and monotypic lambda by flow cytometry analysis) - Chromosome analysis showed 12 of 20 cells examined are normal, 46, X6.  The remaining 8 metaphase cells analyzed are abnormal and represent a hyperdiploid clone containing 1 extra copy each of chromosome 3, 5, 6, 7, 9, 11, 15, 19, 20, 21, 22 as well as a marker chromosome. -Multiple myeloma FISH panel shows nuclei positive for three 1q signals, trisomy 11, and extra signals of chromosome 7, 9 and 15. - Patient was recommended to start chemotherapy with port placement by Dr. Federico Flake at [facility].  She was recommended to have IV chemotherapy as she only has Medicare part A. Patient did not follow-up at [facility]  - She was seen by Dr. Delton Coombes who went over blood work from 04/07/2018 which shows M spike of 4 g of IgG lambda monoclonal protein.  Lambda light chains were elevated at 59,667 with ratio equal to 0.  LDH was 115.  Beta-2 microglobulin was elevated at 12.8.  I have reviewed the images of the bone survey with the patient which showed large expansile lytic lesion in the left shoulder area and  multiple lytic lesions in all the bones.  Patient was advised to not lift any weights.  Would consider radiation to the lytic lesion if there is uncontrollable pain.  -He discussed treatment of multiple myeloma in an non-curative setting with triple drug regimen.  Given her problems with insurance,  IV regimen to start with until we get Revlimid.  Because of her high risk disease, she was recommended for  carfilzomib-based regimen with cyclophosphamide and dexamethasone.  We will start Revlimid in place of cyclophosphamide when we are able to obtain the medicine.    She will RTC on 05/01/2018 for D15 of therapy.  She was previously recommended for acyclovir twice daily for shingles prophylaxis.  She was told to start taking aspirin 81 mg daily. -We will discuss about the role of autologous stem cell transplant down the line.  Labs done 04/27/2018 reviewed and showed WBC 4.6 HB 9.7 plts 210,000.  Chemistries WNL with K+ 4.6, and normal LFTs.  She will follow-up With Dr. Tera Helper prior to next cycle.  2.  Left Arm swelling.  She underwent doppler of LUE on today 04/28/2018 that was negative of DVT.  She was given benadryl in clinic.  Notify clinic if ongoing problems.    3.  Insomnia.  She has takeTylenol PM which is not helping.  She has taken Xanax and Ambien in the past. She reports Lorrin Mais is not helping.  She is prescribed lunesta 1 mg at bedtime.  She will follow-up with with Dr. Tera Helper if ongoing problems.    4.  PTSD.  Have referred to social work and will  need to consider psychiatry of psychology for management.    5.  Hypercalcemia.  PT was treated with zometa.  Calcium low at 6.9.  Short course of calcium for 1 week with repeat labs on RTC.  Check ionized calcium on RTC.    6.  Abdominal pain.  She was RXd Protonix by ED.  She will receive Pepcid 20 mg IV in clinic today.  Will assess for improvement in symptoms with treatment for reflux due to steroid therapy.    30 minutes spent with more  than 50% in counseling and coordination of care.  with more than 50% of the time spent face-to-face discussing new diagnosis, treatment options, coordination of care.  Interval History:  Historical data obtained from note dated 04/15/2018.  1.  IgG lambda plasma cell myeloma, stage II by R-ISS: - Presentation to [facility] with left-sided rib pain on 12/29/2017, found to have hypercalcemia, anemia and acute kidney injury, further work-up showed 3.2 g/dL of IgG lambda monoclonal gammopathy. -Skeletal survey reportedly demonstrated diffuse osteopenia and multiple lytic lesions in bilateral lower extremities. -Bone marrow biopsy on 12/31/2017 showed hypercellular marrow with 40 to 50% cellularity, involved by plasma cell neoplasm (17% plasma cells by manual aspirate differential count, greater than 90% and 30 to 40% by CD138 immunohistochemical analysis of clot and core respectively, and monotypic lambda by flow cytometry analysis) - Chromosome analysis showed 12 of 20 cells examined are normal, 46, X6.  The remaining 8 metaphase cells analyzed are abnormal and represent a hyperdiploid clone containing 1 extra copy each of chromosome 3, 5, 6, 7, 9, 11, 15, 19, 20, 21, 22 as well as a marker chromosome. -Multiple myeloma FISH panel shows nuclei positive for three 1q signals, trisomy 11, and extra signals of chromosome 7, 9 and 15. - Patient was recommended to start chemotherapy with port placement by Dr. Federico Flake at [facility].  She was recommended to have IV chemotherapy as she only has Medicare part A. Patient did not follow-up at [facility]  Current Status:  Pt was seen at ED yesterday.  She is complaining of left arm swelling.       Multiple myeloma without remission ([place])   04/15/2018 Initial Diagnosis    Multiple myeloma without remission ([place])    04/15/2018 -  Chemotherapy    The patient had palonosetron (ALOXI) injection 0.25 mg, 0.25 mg, Intravenous,  Once, 1 of 4 cycles Administration: 0.25 mg  (04/17/2018), 0.25 mg (04/24/2018) cyclophosphamide (CYTOXAN) 540 mg in sodium chloride 0.9 % 250 mL chemo infusion, 300 mg/m2 = 540 mg, Intravenous,  Once, 1 of 4 cycles Administration: 540 mg (04/17/2018), 540 mg (04/24/2018) carfilzomib (KYPROLIS) 36 mg in dextrose 5 % 50 mL chemo infusion, 20 mg/m2 = 36 mg, Intravenous, Once, 1 of 4 cycles Administration: 36 mg (04/17/2018), 36 mg (04/18/2018), 60 mg (04/24/2018), 60 mg (04/25/2018)  for chemotherapy treatment.       Problem List Patient Active Problem List   Diagnosis Date Noted  . Multiple myeloma without remission ([place]) [C90.00] 04/15/2018  . Goals of care, counseling/discussion [Z71.89] 04/15/2018  . Multiple myeloma not having achieved remission ([place]) [C90.00] 04/07/2018    Past Medical History Past Medical History:  Diagnosis Date  . Multiple myeloma ([place])    multiple myeloma    Past Surgical History Past Surgical History:  Procedure Laterality Date  . ABDOMINAL HYSTERECTOMY     total  . PORTACATH PLACEMENT Right 04/14/2018   Procedure: INSERTION PORT-A-CATH;  Surgeon: [name], MD;  Location:  [facility];  Service: General;  Laterality: Right;    Family History Family History  Problem Relation Age of Onset  . Heart disease Mother   . Emphysema Father   . Diabetes Sister   . Cancer Brother        liver, lung, and colon  . Diabetes Brother      Social History  reports that she has never smoked. She has never used smokeless tobacco. She reports that she has current or past drug history. She reports that she does not drink alcohol.  Medications  Current Outpatient Medications:  .  acyclovir (ZOVIRAX) 400 MG tablet, Take 1 tablet (400 mg total) by mouth daily., Disp: 30 tablet, Rfl: 3 .  allopurinol (ZYLOPRIM) 300 MG tablet, Take 1 tablet (300 mg total) by mouth daily., Disp: 30 tablet, Rfl: 1 .  ASPIRIN 81 PO, Take 81 mg by mouth daily., Disp: , Rfl:  .  Carfilzomib (KYPROLIS IV), Inject into the vein., Disp: , Rfl:   .  cyclophosphamide in sodium chloride 0.9 % 250 mL, Inject into the vein once., Disp: , Rfl:  .  dexamethasone (DECADRON) 4 MG tablet, Take 5 tablets (20 mg) on days 1, 2, 8, 9, 15 and 16 on the days of chemotherapy.  Take 10 tablets during the week of no chemotherapy., Disp: 40 tablet, Rfl: 4 .  eszopiclone (LUNESTA) 1 MG TABS tablet, Take 1 tablet (1 mg total) by mouth at bedtime as needed for sleep. Take immediately before bedtime, Disp: 15 tablet, Rfl: 0 .  HYDROcodone-acetaminophen (NORCO) 5-325 MG tablet, Take 1 tablet by mouth every 6 (six) hours as needed for moderate pain., Disp: 60 tablet, Rfl: 0 .  lactulose (CHRONULAC) 10 GM/15ML solution, Take 15 to 30 ml at bedtime every night to assist with moving bowels.  Titrate down if having multiple bowel movements.  If bowels hav not moved in 3 to 4 days or longer-may give 15 to 30 ml every 2 hours until bowlel movement then resume taking 15 to 30 ml every night., Disp: 240 mL, Rfl: 0 .  lenalidomide (REVLIMID) 10 MG capsule, Take 1 capsule (10 mg total) by mouth daily., Disp: 14 capsule, Rfl: 0 .  lidocaine-prilocaine (EMLA) cream, Apply to affected area once, Disp: 30 g, Rfl: 3 .  lisinopril (PRINIVIL,ZESTRIL) 10 MG tablet, Take 1 tablet (10 mg total) by mouth daily., Disp: 30 tablet, Rfl: 1 .  ondansetron (ZOFRAN) 4 MG tablet, Take 1 tablet (4 mg total) by mouth every 8 (eight) hours as needed for nausea or vomiting., Disp: 20 tablet, Rfl: 1 .  pantoprazole (PROTONIX) 40 MG tablet, Take 1 tablet (40 mg total) by mouth daily., Disp: 10 tablet, Rfl: 0 .  prochlorperazine (COMPAZINE) 10 MG tablet, Take 1 tablet (10 mg total) by mouth every 6 (six) hours as needed (Nausea or vomiting)., Disp: 30 tablet, Rfl: 1 .  promethazine (PHENERGAN) 25 MG tablet, Take 1 tablet (25 mg total) by mouth every 6 (six) hours as needed., Disp: 15 tablet, Rfl: 0 .  temazepam (RESTORIL) 15 MG capsule, Take 1 capsule (15 mg total) by mouth at bedtime as needed for  sleep., Disp: 30 capsule, Rfl: 0 .  traMADol (ULTRAM) 50 MG tablet, Take 1 tablet (50 mg total) by mouth every 6 (six) hours as needed., Disp: 60 tablet, Rfl: 0 .  trolamine salicylate (ASPERCREME) 10 % cream, Apply 1 application topically daily as needed for muscle pain., Disp: , Rfl:  .  zolpidem (AMBIEN) 10 MG  tablet, Take 1 tablet (10 mg total) by mouth at bedtime as needed for sleep. (Patient taking differently: Take 10 mg by mouth at bedtime. ), Disp: 30 tablet, Rfl: 0 No current facility-administered medications for this visit.   Facility-Administered Medications Ordered in Other Visits:  .  0.9 %  sodium chloride infusion, , Intravenous, Continuous, Avian Greenawalt, Mathis Dad, MD, Stopped at 04/28/18 1442 .  sodium chloride flush (NS) 0.9 % injection 10 mL, 10 mL, Intravenous, PRN, Ardis Fullwood, MD, 10 mL at 04/28/18 1320  Allergies Morphine and related  Review of Systems Review of Systems - Oncology ROS negative other than left arm swelling, insomnia   Physical Exam  Vitals Wt Readings from Last 3 Encounters:  04/28/18 167 lb (75.8 kg)  04/24/18 165 lb 9.6 oz (75.1 kg)  04/17/18 169 lb (76.7 kg)   Temp Readings from Last 3 Encounters:  04/28/18 98.5 F (36.9 C) (Oral)  04/28/18 98.6 F (37 C) (Oral)  04/27/18 98.5 F (36.9 C) (Axillary)   BP Readings from Last 3 Encounters:  04/28/18 (!) 167/78  04/28/18 (!) 167/70  04/27/18 138/74   Pulse Readings from Last 3 Encounters:  04/28/18 77  04/28/18 75  04/27/18 70   Constitutional: Well-developed, well-nourished, and in no distress.   HENT: Head: Normocephalic and atraumatic.  Mouth/Throat: No oropharyngeal exudate. Mucosa moist. Eyes: Pupils are equal, round, and reactive to light. Conjunctivae are normal. No scleral icterus.  Neck: Normal range of motion. Neck supple. No JVD present.  Cardiovascular: Normal rate, regular rhythm and normal heart sounds.  Exam reveals no gallop and no friction rub.   No murmur  heard. Pulmonary/Chest: Effort normal and breath sounds normal. No respiratory distress. No wheezes.No rales.  Abdominal: Soft. Bowel sounds are normal. No distension. There is no tenderness. There is no guarding.  Musculoskeletal: No edema or tenderness.  Lymphadenopathy: No cervical, axillary or supraclavicular adenopathy.  Neurological: Alert and oriented to person, place, and time. No cranial nerve deficit.  Skin: Skin is warm and dry. No rash noted. No erythema. No pallor.  Psychiatric: Affect and judgment normal.   Labs Admission on 04/27/2018, Discharged on 04/27/2018  Component Date Value Ref Range Status  . WBC 04/27/2018 4.6  4.0 - 10.5 K/uL Final  . RBC 04/27/2018 3.19* 3.87 - 5.11 MIL/uL Final  . Hemoglobin 04/27/2018 9.7* 12.0 - 15.0 g/dL Final  . HCT 04/27/2018 29.4* 36.0 - 46.0 % Final  . MCV 04/27/2018 92.2  78.0 - 100.0 fL Final  . MCH 04/27/2018 30.4  26.0 - 34.0 pg Final  . MCHC 04/27/2018 33.0  30.0 - 36.0 g/dL Final  . RDW 04/27/2018 14.1  11.5 - 15.5 % Final  . Platelets 04/27/2018 210  150 - 400 K/uL Final  . Neutrophils Relative % 04/27/2018 64  % Final  . Neutro Abs 04/27/2018 3.0  1.7 - 7.7 K/uL Final  . Lymphocytes Relative 04/27/2018 24  % Final  . Lymphs Abs 04/27/2018 1.1  0.7 - 4.0 K/uL Final  . Monocytes Relative 04/27/2018 12  % Final  . Monocytes Absolute 04/27/2018 0.5  0.1 - 1.0 K/uL Final  . Eosinophils Relative 04/27/2018 0  % Final  . Eosinophils Absolute 04/27/2018 0.0  0.0 - 0.7 K/uL Final  . Basophils Relative 04/27/2018 0  % Final  . Basophils Absolute 04/27/2018 0.0  0.0 - 0.1 K/uL Final   Performed at [facility], [address] [place], [place] [address]  . Sodium 04/27/2018 136  135 - 145 mmol/L  Final  . Potassium 04/27/2018 4.6  3.5 - 5.1 mmol/L Final  . Chloride 04/27/2018 110  98 - 111 mmol/L Final  . CO2 04/27/2018 18* 22 - 32 mmol/L Final  . Glucose, Bld 04/27/2018 98  70 - 99 mg/dL Final  . BUN 04/27/2018 42* 8 - 23 mg/dL  Final  . Creatinine, Ser 04/27/2018 3.17* 0.44 - 1.00 mg/dL Final  . Calcium 04/27/2018 6.7* 8.9 - 10.3 mg/dL Final  . Total Protein 04/27/2018 9.5* 6.5 - 8.1 g/dL Final  . Albumin 04/27/2018 3.0* 3.5 - 5.0 g/dL Final  . AST 04/27/2018 13* 15 - 41 U/L Final  . ALT 04/27/2018 12  0 - 44 U/L Final  . Alkaline Phosphatase 04/27/2018 76  38 - 126 U/L Final  . Total Bilirubin 04/27/2018 0.6  0.3 - 1.2 mg/dL Final  . GFR calc non Af Amer 04/27/2018 14* >60 mL/min Final  . GFR calc Af Amer 04/27/2018 16* >60 mL/min Final   Comment: (NOTE) The eGFR has been calculated using the CKD EPI equation. This calculation has not been validated in all clinical situations. eGFR's persistently <60 mL/min signify possible Chronic Kidney Disease.   Georgiann Hahn gap 04/27/2018 8  5 - 15 Final   Performed at [facility], [address] [place], [place] [address]  . ABO/RH(D) 04/27/2018 A NEG   Final  . Antibody Screen 04/27/2018 NEG   Final  . Sample Expiration 04/27/2018    Final                   Value:04/30/2018 Performed at [facility], [address], [place], [place] [address]   . Unit Number 04/27/2018 [id number]   Final  . Blood Component Type 04/27/2018 RED CELLS,LR   Final  . Unit division 04/27/2018 00   Final  . Status of Unit 04/27/2018 ALLOCATED   Final  . Transfusion Status 04/27/2018 OK TO TRANSFUSE   Final  . Crossmatch Result 04/27/2018 Compatible   Final  . Color, Urine 04/27/2018 COLORLESS* YELLOW Final  . APPearance 04/27/2018 CLEAR  CLEAR Final  . Specific Gravity, Urine 04/27/2018 1.005  1.005 - 1.030 Final  . pH 04/27/2018 5.0  5.0 - 8.0 Final  . Glucose, UA 04/27/2018 NEGATIVE  NEGATIVE mg/dL Final  . Hgb urine dipstick 04/27/2018 SMALL* NEGATIVE Final  . Bilirubin Urine 04/27/2018 NEGATIVE  NEGATIVE Final  . Ketones, ur 04/27/2018 NEGATIVE  NEGATIVE mg/dL Final  . Protein, ur 04/27/2018 NEGATIVE  NEGATIVE mg/dL Final  . Nitrite 04/27/2018 NEGATIVE  NEGATIVE Final  .  Leukocytes, UA 04/27/2018 NEGATIVE  NEGATIVE Final  . RBC / HPF 04/27/2018 0-5  0 - 5 RBC/hpf Final  . WBC, UA 04/27/2018 0-5  0 - 5 WBC/hpf Final  . Bacteria, UA 04/27/2018 RARE* NONE SEEN Final  . Squamous Epithelial / LPF 04/27/2018 0-5  0 - 5 Final   Performed at [facility], [address], [place], [place] [address]  . Blood Product Unit Number 04/27/2018 [id number]   Final  . Unit Type and Rh 04/27/2018 0600   Final  . Blood Product Expiration Date 04/27/2018 [id number]   Final  . Lipase 04/27/2018 65* 11 - 51 U/L Final   Performed at [facility], [address], [place], [place] [address]     Pathology Orders Placed This Encounter  Procedures  . US Venous Img Upper Uni Left    PT TO LEAVE AFTER [place] IS COMPLETE    Standing Status:   Future  Number of Occurrences:   1    Standing Expiration Date:   04/28/2019    Order Specific Question:   Reason for Exam (SYMPTOM  OR DIAGNOSIS REQUIRED)    Answer:   left arm swelling evalate for dvt    Order Specific Question:   Preferred imaging location?    Answer:   [facility]    Order Specific Question:   Call Results- Best Contact Number?    Answer:   [phone]       Zoila Shutter MD

## 2018-04-28 NOTE — Patient Instructions (Signed)
[place] at [facility]  Discharge Instructions:  You were seen by dr. Walden Field today.  _______________________________________________________________  Thank you for choosing [place] [facility] to provide your oncology and hematology care.  To afford each patient quality time with our providers, please arrive at least 15 minutes before your scheduled appointment.  You need to re-schedule your appointment if you arrive 10 or more minutes late.  We strive to give you quality time with our providers, and arriving late affects you and other patients whose appointments are after yours.  Also, if you no show three or more times for appointments you may be dismissed from the clinic.  Again, thank you for choosing [place] [facility] [place] hope is that these requests will allow you access to exceptional care and in a timely manner. _______________________________________________________________  If you have questions after your visit, please contact our office at [phone] between the hours of 8:30 a.m. and 5:00 p.m. Voicemails left after 4:30 p.m. will not be returned until the following business day. _______________________________________________________________  For prescription refill requests, have your pharmacy contact our office. _______________________________________________________________  Recommendations made by the consultant and any test results will be sent to your referring physician. _______________________________________________________________

## 2018-04-29 ENCOUNTER — Encounter (HOSPITAL_COMMUNITY): Payer: Self-pay | Admitting: General Practice

## 2018-04-29 ENCOUNTER — Telehealth (HOSPITAL_COMMUNITY): Payer: Self-pay | Admitting: Hematology

## 2018-04-29 NOTE — Progress Notes (Signed)
[place] CSW Progress Notes  Referral made to [facility], provider will reach out to patient and schedule appointment.  Edwyna Shell, LCSW Clinical Social Worker Phone:  [phone]

## 2018-04-29 NOTE — Telephone Encounter (Signed)
FAXED FIN ASSIST APP TO [facility] .

## 2018-04-29 NOTE — Progress Notes (Signed)
[facility] CSW Progress Notes  Request received from MD to talk w patient re difficulty sleeping and increase in anxiety.  Reports increase in anxiety after diagnosis of cancer, worried about "what this will do to my family."  Anxiety increased recently when patient began taking steroid medications.  Insomnia has been an issue for patient for past 32 years, patient uses sleep medications prescribed by oncologist.  Lives w adult son, does not get out much due to weakness/fatigue due to illness.  Little social contact other than son who is home most of the day. As sleep problems are of long duration and anxiety appears to be significantly related to medication regimen, CSW discussed option of referral to psychiatrist for evaluation and treatment.  Patient agreeable to referral for medication management w psychiatrist - CSW will investigate options.  Can be available to assist w brief therapy for cancer related anxiety management if needed after evaluation by psychiatry.  Oncologist advised of above.    Edwyna Shell, LCSW Clinical Social Worker Phone:  [phone]

## 2018-04-30 LAB — TYPE AND SCREEN
ABO/RH(D): A NEG
Antibody Screen: NEGATIVE
UNIT DIVISION: 0

## 2018-04-30 LAB — BPAM RBC
BLOOD PRODUCT EXPIRATION DATE: 201910062359
UNIT TYPE AND RH: 600

## 2018-05-01 ENCOUNTER — Inpatient Hospital Stay (HOSPITAL_COMMUNITY): Payer: Medicaid Other

## 2018-05-01 ENCOUNTER — Other Ambulatory Visit (HOSPITAL_COMMUNITY): Payer: Self-pay

## 2018-05-01 ENCOUNTER — Encounter (HOSPITAL_COMMUNITY): Payer: Self-pay

## 2018-05-01 VITALS — BP 140/70 | HR 75 | Temp 98.2°F | Resp 18 | Wt 162.0 lb

## 2018-05-01 DIAGNOSIS — C9000 Multiple myeloma not having achieved remission: Secondary | ICD-10-CM

## 2018-05-01 DIAGNOSIS — Z5112 Encounter for antineoplastic immunotherapy: Secondary | ICD-10-CM | POA: Diagnosis not present

## 2018-05-01 LAB — COMPREHENSIVE METABOLIC PANEL
ALBUMIN: 3 g/dL — AB (ref 3.5–5.0)
ALK PHOS: 111 U/L (ref 38–126)
ALT: 10 U/L (ref 0–44)
AST: 11 U/L — ABNORMAL LOW (ref 15–41)
Anion gap: 10 (ref 5–15)
BILIRUBIN TOTAL: 0.3 mg/dL (ref 0.3–1.2)
BUN: 27 mg/dL — ABNORMAL HIGH (ref 8–23)
CALCIUM: 6.3 mg/dL — AB (ref 8.9–10.3)
CO2: 19 mmol/L — AB (ref 22–32)
Chloride: 108 mmol/L (ref 98–111)
Creatinine, Ser: 3 mg/dL — ABNORMAL HIGH (ref 0.44–1.00)
GFR calc Af Amer: 17 mL/min — ABNORMAL LOW (ref >60)
GFR calc non Af Amer: 15 mL/min — ABNORMAL LOW (ref >60)
GLUCOSE: 132 mg/dL — AB (ref 70–99)
Potassium: 4.1 mmol/L (ref 3.5–5.1)
SODIUM: 137 mmol/L (ref 135–145)
TOTAL PROTEIN: 8.8 g/dL — AB (ref 6.5–8.1)

## 2018-05-01 LAB — CBC WITH DIFFERENTIAL/PLATELET
Basophils Absolute: 0 10*3/uL (ref 0.0–0.1)
Basophils Relative: 0 %
Eosinophils Absolute: 0.1 10*3/uL (ref 0.0–0.7)
Eosinophils Relative: 3 %
HEMATOCRIT: 28.9 % — AB (ref 36.0–46.0)
HEMOGLOBIN: 9.4 g/dL — AB (ref 12.0–15.0)
LYMPHS ABS: 0.6 10*3/uL — AB (ref 0.7–4.0)
LYMPHS PCT: 18 %
MCH: 30.1 pg (ref 26.0–34.0)
MCHC: 32.5 g/dL (ref 30.0–36.0)
MCV: 92.6 fL (ref 78.0–100.0)
MONO ABS: 0.3 10*3/uL (ref 0.1–1.0)
MONOS PCT: 9 %
Neutro Abs: 2.3 10*3/uL (ref 1.7–7.7)
Neutrophils Relative %: 70 %
Platelets: 213 10*3/uL (ref 150–400)
RBC: 3.12 MIL/uL — AB (ref 3.87–5.11)
RDW: 14.2 % (ref 11.5–15.5)
WBC: 3.3 10*3/uL — ABNORMAL LOW (ref 4.0–10.5)

## 2018-05-01 LAB — SAMPLE TO BLOOD BANK

## 2018-05-01 MED ORDER — HEPARIN SOD (PORK) LOCK FLUSH 100 UNIT/ML IV SOLN
INTRAVENOUS | Status: AC
  Filled 2018-05-01: qty 5

## 2018-05-01 MED ORDER — SODIUM CHLORIDE 0.9 % IV SOLN
Freq: Once | INTRAVENOUS | Status: AC
  Administered 2018-05-01: 11:00:00 via INTRAVENOUS

## 2018-05-01 MED ORDER — DEXTROSE 5 % IV SOLN
33.0000 mg/m2 | Freq: Once | INTRAVENOUS | Status: AC
  Administered 2018-05-01: 60 mg via INTRAVENOUS
  Filled 2018-05-01: qty 30

## 2018-05-01 MED ORDER — PALONOSETRON HCL INJECTION 0.25 MG/5ML
0.2500 mg | Freq: Once | INTRAVENOUS | Status: AC
  Administered 2018-05-01: 0.25 mg via INTRAVENOUS
  Filled 2018-05-01: qty 5

## 2018-05-01 MED ORDER — SODIUM CHLORIDE 0.9 % IV SOLN
300.0000 mg/m2 | Freq: Once | INTRAVENOUS | Status: AC
  Administered 2018-05-01: 540 mg via INTRAVENOUS
  Filled 2018-05-01: qty 25

## 2018-05-01 MED ORDER — SODIUM CHLORIDE 0.9 % IV SOLN
10.0000 mg | Freq: Once | INTRAVENOUS | Status: AC
  Administered 2018-05-01: 10 mg via INTRAVENOUS
  Filled 2018-05-01: qty 1

## 2018-05-01 MED ORDER — SODIUM CHLORIDE 0.9% FLUSH
10.0000 mL | INTRAVENOUS | Status: DC | PRN
  Administered 2018-05-01: 10 mL
  Filled 2018-05-01: qty 10

## 2018-05-01 NOTE — Progress Notes (Signed)
Patient to treatment area for chemo.  Calcium 6.3 today and patient complaining of lips tingling and dizziness.  Reviewed labs and symptoms with Dr. Walden Field with verbal order ok to treat today and start taking Calcium 1000mg  daily.  Reviewed with the patient and caregiver with understanding verbalized.   Patient instructed to bring her Revlimid prescription with next visit in two weeks to review with Dr. Delton Coombes and start with next cycle per Dr. Walden Field.   Patient continues to have nausea.  After review of medications the patient has been taking her Dexamethasone on an empty stomach.  Instructed the patient to have food on her stomach before taking the medications with understanding verbalized.   Patient tolerated treatment with no complaints voiced.  Port site clean and dry with good blood return noted before and after administration of chemo.  Site left accessed and taped.  No bruising or swelling noted at site.  VSs with discharge and left by wheelchair with caregiver.

## 2018-05-01 NOTE — Patient Instructions (Signed)
[facility] Discharge Instructions for Patients Receiving Chemotherapy  Today you received the following chemotherapy agents kyprolis and cytoxan.    If you develop nausea and vomiting that is not controlled by your nausea medication, call the clinic.   BELOW ARE SYMPTOMS THAT SHOULD BE REPORTED IMMEDIATELY:  *FEVER GREATER THAN 100.5 F  *CHILLS WITH OR WITHOUT FEVER  NAUSEA AND VOMITING THAT IS NOT CONTROLLED WITH YOUR NAUSEA MEDICATION  *UNUSUAL SHORTNESS OF BREATH  *UNUSUAL BRUISING OR BLEEDING  TENDERNESS IN MOUTH AND THROAT WITH OR WITHOUT PRESENCE OF ULCERS  *URINARY PROBLEMS  *BOWEL PROBLEMS  UNUSUAL RASH Items with * indicate a potential emergency and should be followed up as soon as possible.  Feel free to call the clinic should you have any questions or concerns. The clinic phone number is [phone].  Please show the [place] at check-in to the Emergency Department and triage nurse.

## 2018-05-02 ENCOUNTER — Inpatient Hospital Stay (HOSPITAL_COMMUNITY): Payer: Medicaid Other

## 2018-05-02 ENCOUNTER — Encounter (HOSPITAL_COMMUNITY): Payer: Self-pay

## 2018-05-02 VITALS — BP 163/71 | HR 79 | Temp 97.5°F | Resp 18 | Wt 166.4 lb

## 2018-05-02 DIAGNOSIS — C9000 Multiple myeloma not having achieved remission: Secondary | ICD-10-CM

## 2018-05-02 DIAGNOSIS — Z5112 Encounter for antineoplastic immunotherapy: Secondary | ICD-10-CM | POA: Diagnosis not present

## 2018-05-02 MED ORDER — ZOLPIDEM TARTRATE 10 MG PO TABS: 10.0000 mg | ORAL_TABLET | Freq: Every evening | ORAL | 0 refills | Status: DC | PRN

## 2018-05-02 MED ORDER — OCTREOTIDE ACETATE 30 MG IM KIT
PACK | INTRAMUSCULAR | Status: AC
  Filled 2018-05-02: qty 1

## 2018-05-02 MED ORDER — HEPARIN SOD (PORK) LOCK FLUSH 100 UNIT/ML IV SOLN
500.0000 [IU] | Freq: Once | INTRAVENOUS | Status: AC | PRN
  Administered 2018-05-02: 500 [IU]

## 2018-05-02 MED ORDER — SODIUM CHLORIDE 0.9 % IV SOLN
Freq: Once | INTRAVENOUS | Status: AC
  Administered 2018-05-02: 11:00:00 via INTRAVENOUS

## 2018-05-02 MED ORDER — FAMOTIDINE IN NACL 20-0.9 MG/50ML-% IV SOLN
INTRAVENOUS | Status: AC
  Filled 2018-05-02: qty 50

## 2018-05-02 MED ORDER — SODIUM CHLORIDE 0.9 % IV SOLN
10.0000 mg | Freq: Once | INTRAVENOUS | Status: AC
  Administered 2018-05-02: 10 mg via INTRAVENOUS
  Filled 2018-05-02: qty 1

## 2018-05-02 MED ORDER — DEXTROSE 5 % IV SOLN
33.0000 mg/m2 | Freq: Once | INTRAVENOUS | Status: AC
  Administered 2018-05-02: 60 mg via INTRAVENOUS
  Filled 2018-05-02: qty 30

## 2018-05-02 MED ORDER — SODIUM CHLORIDE 0.9% FLUSH
10.0000 mL | INTRAVENOUS | Status: DC | PRN
  Administered 2018-05-02: 10 mL
  Filled 2018-05-02: qty 10

## 2018-05-02 MED ORDER — SODIUM CHLORIDE 0.9 % IV SOLN: Freq: Once | INTRAVENOUS | Status: DC

## 2018-05-02 NOTE — Progress Notes (Signed)
Nutrition Assessment   Reason for Assessment:   Patient identified on Malnutrition Screening tool for weight loss and poor appetite   ASSESSMENT:   71 year old female with multiple myeloma.  Past medical history reviewed.    Met with patient and friend in infusion room this am.  Patient reports that her appetite has been decreased for about a month. Reports that smells make her feel nauseated, also having abdominal pain at times and nausea. Noted patient taking decadron on empty stomach.  Reports no issues with constipation usually has daily BM.  Reports last night for supper ate 1/4 of potato with sour cream, 1/2 hamburger steak and 1/2 salad.  Yesterday earlier in the day ate yogurt, jello and 4 packs of peanut butter crackers (while at cancer center).  This am ate toast and peanut butter crackers.  Reports that she has had issues with dehydration so has been trying to drink 6, 8 oz glasses of fluids daily (water, gingerale, milk).  Likes ensure and banana popsicles.  Nutrition Focused Physical Exam: deferred   Medications: protonix, phenergan, zofran, compazine, lactulose, decadron Calcium 1098m   Labs: BUN 27, creatinine 3, cal 6.3, albumin 3.0, corrected calcium 7.1, glucose 132   Anthropometrics:   Height: 64 inches  Weight: 162 lb UBW: 175 lb per patient about 1 month ago.  Chart reveals wt of 167 lb on 04/07/18 BMI: 27  3% weight loss in the last month, not significant  Estimated Energy Needs  Kcals: 1850-2200 calories/d Protein: 93-110 g  Fluid: 2.2 L/d   NUTRITION DIAGNOSIS: Inadequate oral intake related to cancer related side effects as evidenced by 3% weight loss and poor appetite   MALNUTRITION DIAGNOSIS: none at this time   INTERVENTION:  Encouraged small frequent meals.  Patient reports tends to get overwhelmed with large portions of food. Discussed oral nutrition supplements and encouraged higher calorie shakes.  Discussed strategies to help with smell  issues.  Factsheet provided Discussed strategies to help with nausea, vomiting.  Factsheet given   MONITORING, EVALUATION, GOAL: Patient will consume adequate calories and protein to prevent further weight loss   Next Visit: Sept 27 during infusion  Xayvier Vallez B. AZenia Resides RKansas [place] LCharlotte Court HouseRegistered Dietitian [phone](pager)

## 2018-05-02 NOTE — Patient Instructions (Signed)
[facility] Discharge Instructions for Patients Receiving Chemotherapy   Beginning January 23rd 2017 lab work for the [facility] will be done in the  Main lab at [facility] on 1st floor. If you have a lab appointment with the [facility] please come in thru the  Main Entrance and check in at the main information desk   Today you received the following chemotherapy agents Kyprolis. Follow-up as scheduled. Call clinic for any questions or concerns  To help prevent nausea and vomiting after your treatment, we encourage you to take your nausea medication   If you develop nausea and vomiting, or diarrhea that is not controlled by your medication, call the clinic.  The clinic phone number is [phone]. Office hours are Monday-Friday 8:30am-5:00pm.  BELOW ARE SYMPTOMS THAT SHOULD BE REPORTED IMMEDIATELY:  *FEVER GREATER THAN 101.0 F  *CHILLS WITH OR WITHOUT FEVER  NAUSEA AND VOMITING THAT IS NOT CONTROLLED WITH YOUR NAUSEA MEDICATION  *UNUSUAL SHORTNESS OF BREATH  *UNUSUAL BRUISING OR BLEEDING  TENDERNESS IN MOUTH AND THROAT WITH OR WITHOUT PRESENCE OF ULCERS  *URINARY PROBLEMS  *BOWEL PROBLEMS  UNUSUAL RASH Items with * indicate a potential emergency and should be followed up as soon as possible. If you have an emergency after office hours please contact your primary care physician or go to the nearest emergency department.  Please call the clinic during office hours if you have any questions or concerns.   You may also contact the Patient Navigator at [phone] should you have any questions or need assistance in obtaining follow up care.      Resources For Cancer Patients and their Caregivers ? American Cancer Society: Can assist with transportation, wigs, general needs, runs Look Good Feel Better.        904-265-2726 ? Cancer Care: Provides financial assistance, online support groups, medication/co-pay assistance.  1-800-813-HOPE  (863)371-8238) ? [place] Assists [place] cancer patients and their families through emotional , educational and financial support.  351-440-5069 ? [place] DSS Where to apply for food stamps, Medicaid and utility assistance. 417 046 1915 ? [facility]: Transportation to medical appointments. (340)387-0221 ? Social Security Administration: May apply for disability if have a Stage IV cancer. 6415380794 (925)132-5330 ? [employer], Disability and Transit Services: Assists with nutrition, care and transit needs. [phone]

## 2018-05-02 NOTE — Progress Notes (Signed)
Ivor Costa tolerated Kyprolis infusion well without complaints or incident. VSS upon discharge. Pt discharged self ambulatory in satisfactory condition accompanied by a caregiver

## 2018-05-05 ENCOUNTER — Inpatient Hospital Stay (HOSPITAL_COMMUNITY): Payer: Medicaid Other

## 2018-05-05 ENCOUNTER — Encounter (HOSPITAL_COMMUNITY): Payer: Self-pay | Admitting: *Deleted

## 2018-05-05 ENCOUNTER — Other Ambulatory Visit: Payer: Self-pay

## 2018-05-05 ENCOUNTER — Encounter (HOSPITAL_COMMUNITY): Payer: Self-pay

## 2018-05-05 DIAGNOSIS — C9000 Multiple myeloma not having achieved remission: Secondary | ICD-10-CM

## 2018-05-05 DIAGNOSIS — Z5112 Encounter for antineoplastic immunotherapy: Secondary | ICD-10-CM | POA: Diagnosis not present

## 2018-05-05 LAB — MAGNESIUM: MAGNESIUM: 1.6 mg/dL — AB (ref 1.7–2.4)

## 2018-05-05 MED ORDER — ONDANSETRON 8 MG PO TBDP
ORAL_TABLET | ORAL | Status: AC
  Filled 2018-05-05: qty 1

## 2018-05-05 MED ORDER — SODIUM CHLORIDE 0.9 % IV SOLN
INTRAVENOUS | Status: DC
  Administered 2018-05-05: 15:00:00 via INTRAVENOUS

## 2018-05-05 MED ORDER — SODIUM CHLORIDE 0.9 % IV SOLN
1.0000 g | Freq: Once | INTRAVENOUS | Status: DC
  Filled 2018-05-05: qty 10

## 2018-05-05 MED ORDER — SODIUM CHLORIDE 0.9 % IV SOLN
1.0000 g | Freq: Once | INTRAVENOUS | Status: AC
  Administered 2018-05-05: 1 g via INTRAVENOUS
  Filled 2018-05-05: qty 10

## 2018-05-05 MED ORDER — ONDANSETRON 8 MG PO TBDP
8.0000 mg | ORAL_TABLET | Freq: Once | ORAL | Status: AC
  Administered 2018-05-05: 8 mg via ORAL

## 2018-05-05 NOTE — Progress Notes (Signed)
Patient called clinic stating that she was shaking, weak, no energy, and having abdominal pain. She denies fever at this time.  I spoke with Dr. Walden Field and patient is to come in for ionized calcium and fluids.  Patient was advised of appointments.

## 2018-05-05 NOTE — Progress Notes (Signed)
Pt has multiple complaints upon arrival - reports feeling weak and having muscle twitching, ongoing x approx 1 week.  Pt was noted to have Ca++ 6.3 4 days ago on labs performed then.  Also c/o of upper abd pressure, but she is unable to tell me the exact onset, only that it started "after chemo".  C/o nausea,  anorexia, and "dry heaves".  Reports nausea meds are not helping and she has not taken any of her nausea meds today.  Upon reviewing medication list, it is noted that pt is prescribed compazine, and was also recently prescribed phenergan by ER physician on 04/27/18. Pt instructed (written and verbally) to NOT take Phenergan and Compazine together, as this may contribute to her muscle twitching.  She states, "The only one I don't take is that little yellow-green colored pill".  Advised pt to choose either the phenergan or compazine to take as prescribed, and to put up the other away from her daily medications as to avoid confusion.  She verbalizes understanding and agrees to do this.    Tolerated IV hydration fluids and calcium gluconate infusion w/o incident.  Instructed to report to the ED with any worsening upper abdominal pain or involuntary muscle movements. VSS.  Discharged in c/o family.

## 2018-05-07 ENCOUNTER — Encounter (HOSPITAL_COMMUNITY): Payer: Self-pay | Admitting: General Practice

## 2018-05-07 ENCOUNTER — Encounter (HOSPITAL_COMMUNITY): Payer: Self-pay | Admitting: *Deleted

## 2018-05-07 NOTE — Addendum Note (Signed)
Addended by: Gerhard Perches on: 05/07/2018 02:48 PM   Modules accepted: Orders

## 2018-05-07 NOTE — Progress Notes (Signed)
[name] CSW Progress Note  Request received from RN to reach out to patient re additional mental health resources. Patient has upcoming appointment at [facility] in [place] which has been moved up to Oct 3rd.  Patient aware she has chemo that day but thinks both appointments will be possible as [place] OP is in afternoon.   [facility] Milus Glazier can see patient for first appointment for Comprehensive Clinical Assessment on Mon Sept 30th at 10 AM [phone], facility locatino is same as [facility] [address] in [place]).  Patient advised of the above. Was asked to bring insurance card to all appointments and call to cancel/reschedule if needed.  Edwyna Shell, LCSW Clinical Social Worker Phone:  [phone]

## 2018-05-07 NOTE — Progress Notes (Signed)
[place] Psychosocial Distress Screening Clinical Social Work  Clinical Social Work was referred by distress screening protocol.  The patient scored a 7 on the Psychosocial Distress Thermometer which indicates moderate distress. Clinical Social Worker contacted patient by phone to assess for distress and other psychosocial needs. CSW spoke w patient by phone, discussed issues related to need for mental health linkages.  Coordinated care for both medications management and therapy/assessments w appropriate community providers.  Treatment team updated and patient advised of referrals.   ONCBCN DISTRESS SCREENING 05/07/2018  Screening Type Change in Status  Distress experienced in past week (1-10) 7  Practical problem type   Family Problem type   Emotional problem type Nervousness/Anxiety;Depression;Adjusting to illness  Spiritual/Religous concerns type   Physical Problem type   Referral to clinical social work Yes    Clinical Social Worker follow up needed: Yes.   ensure referrals are clear to patient, touch base to monitor progress and ask treatment team to alert CSW if intervention is needed.    If yes, follow up plan:  See above  Beverely Pace, [place], [place] Worker Phone:  [phone]

## 2018-05-08 ENCOUNTER — Inpatient Hospital Stay (HOSPITAL_COMMUNITY): Payer: Medicaid Other

## 2018-05-08 ENCOUNTER — Encounter (HOSPITAL_COMMUNITY): Payer: Self-pay | Admitting: Internal Medicine

## 2018-05-08 ENCOUNTER — Inpatient Hospital Stay (HOSPITAL_BASED_OUTPATIENT_CLINIC_OR_DEPARTMENT_OTHER): Payer: Medicaid Other | Admitting: Internal Medicine

## 2018-05-08 ENCOUNTER — Other Ambulatory Visit: Payer: Self-pay

## 2018-05-08 VITALS — BP 147/74 | HR 83 | Temp 98.6°F | Resp 18 | Wt 162.4 lb

## 2018-05-08 DIAGNOSIS — M8580 Other specified disorders of bone density and structure, unspecified site: Secondary | ICD-10-CM

## 2018-05-08 DIAGNOSIS — G4700 Insomnia, unspecified: Secondary | ICD-10-CM | POA: Diagnosis not present

## 2018-05-08 DIAGNOSIS — C9000 Multiple myeloma not having achieved remission: Secondary | ICD-10-CM | POA: Diagnosis not present

## 2018-05-08 DIAGNOSIS — R109 Unspecified abdominal pain: Secondary | ICD-10-CM

## 2018-05-08 DIAGNOSIS — F4310 Post-traumatic stress disorder, unspecified: Secondary | ICD-10-CM

## 2018-05-08 DIAGNOSIS — R42 Dizziness and giddiness: Secondary | ICD-10-CM

## 2018-05-08 DIAGNOSIS — M7989 Other specified soft tissue disorders: Secondary | ICD-10-CM

## 2018-05-08 DIAGNOSIS — Z5112 Encounter for antineoplastic immunotherapy: Secondary | ICD-10-CM | POA: Diagnosis not present

## 2018-05-08 MED ORDER — LORAZEPAM 2 MG/ML IJ SOLN
1.0000 mg | Freq: Once | INTRAMUSCULAR | Status: AC
  Administered 2018-05-08: 1 mg via INTRAVENOUS

## 2018-05-08 MED ORDER — LORAZEPAM 2 MG/ML IJ SOLN
INTRAMUSCULAR | Status: AC
  Filled 2018-05-08: qty 1

## 2018-05-08 MED ORDER — HEPARIN SOD (PORK) LOCK FLUSH 100 UNIT/ML IV SOLN
INTRAVENOUS | Status: AC
  Filled 2018-05-08: qty 5

## 2018-05-08 MED ORDER — SODIUM CHLORIDE 0.9 % IV SOLN
INTRAVENOUS | Status: DC
  Administered 2018-05-08: 11:00:00 via INTRAVENOUS

## 2018-05-08 MED ORDER — HEPARIN SOD (PORK) LOCK FLUSH 100 UNIT/ML IV SOLN
500.0000 [IU] | Freq: Once | INTRAVENOUS | Status: AC
  Administered 2018-05-08: 500 [IU] via INTRAVENOUS

## 2018-05-08 MED ORDER — SODIUM CHLORIDE 0.9% FLUSH
10.0000 mL | Freq: Once | INTRAVENOUS | Status: AC
  Administered 2018-05-08: 10 mL via INTRAVENOUS

## 2018-05-08 NOTE — Progress Notes (Signed)
Will give fluids and ativan per orders.    Patient tolerated it well without problems. Vitals stable and discharged home from clinic ambulatory. Follow up as scheduled.

## 2018-05-08 NOTE — Progress Notes (Signed)
Diagnosis No diagnosis found.  Staging Cancer Staging No matching staging information was found for the patient.  Assessment and Plan:  Multiple myeloma without remission (HCC) 1.  IgG lambda plasma cell myeloma, stage II by R-ISS: - Presentation to [facility] with left-sided rib pain on 12/29/2017, found to have hypercalcemia, anemia and acute kidney injury, further work-up showed 3.2 g/dL of IgG lambda monoclonal gammopathy. -Skeletal survey reportedly demonstrated diffuse osteopenia and multiple lytic lesions in bilateral lower extremities. -Bone marrow biopsy on 12/31/2017 showed hypercellular marrow with 40 to 50% cellularity, involved by plasma cell neoplasm (17% plasma cells by manual aspirate differential count, greater than 90% and 30 to 40% by CD138 immunohistochemical analysis of clot and core respectively, and monotypic lambda by flow cytometry analysis) - Chromosome analysis showed 12 of 20 cells examined are normal, 46, X6.  The remaining 8 metaphase cells analyzed are abnormal and represent a hyperdiploid clone containing 1 extra copy each of chromosome 3, 5, 6, 7, 9, 11, 15, 19, 20, 21, 22 as well as a marker chromosome. -Multiple myeloma FISH panel shows nuclei positive for three 1q signals, trisomy 11, and extra signals of chromosome 7, 9 and 15. - Patient was recommended to start chemotherapy with port placement by Dr. Federico Flake at [facility].  She was recommended to have IV chemotherapy as she only has Medicare part A. Patient did not follow-up at [facility]  - She was seen by Dr. Delton Coombes who went over blood work from 04/07/2018 which shows M spike of 4 g of IgG lambda monoclonal protein.  Lambda light chains were elevated at 59,667 with ratio equal to 0.  LDH was 115.  Beta-2 microglobulin was elevated at 12.8.  I have reviewed the images of the bone survey with the patient which showed large expansile lytic lesion in the left shoulder area and multiple lytic lesions in all the  bones.  Patient was advised to not lift any weights.  Would consider radiation to the lytic lesion if there is uncontrollable pain.  Pt on Kyprolis, Cytoxan, Decadron and Revlimid.  She was recently treated with C1 D16 on 9.13.2019.  Continue regimen as recommended.    We have discussed with pt she was previously recommended for acyclovir twice daily for shingles prophylaxis.  She should also take aspirin 81 mg daily.  Pt will RTC for follow-up with Dr. Worthy Keeler prior to next cycle.    2.  Dizziness.  BP is 147/74.  Pt treated with IV fluids in clinic today.    3.  PTSD.  Have referred to social work who has facilitated behavioral health appointment.    4.  GI symptoms.  Pt should continue protoix.  Given dose of Ativan 1 mg IV x 1 in clinic today to help with nausea and anxiety.    5.  Hypercalcemia.  PT was treated with zometa.  Calcium was low at 6.9.  Short course of calcium given in clinic.  Ionized calcium level checked and was 4.3.  Will continue to monitor ionized calcium levels on RTC.    6.  Left Arm swelling.  She underwent doppler of LUE on 04/28/2018 that was negative of DVT.  No swelling noted in clinic today.    7.  Insomnia.  She was takingTylenol PM which was not helping.  She has taken Xanax and Ambien in the past. She reports Lorrin Mais is not helping.  She was prescribed lunesta 1 mg at bedtime.  Pt has been referred to behavioral health.  30 minutes spent with more than 50% in counseling and coordination of care.  with more than 50% of the time spent face-to-face discussing new diagnosis, treatment options, coordination of care.  Interval History:  Historical data obtained from note dated 04/15/2018.  1.  IgG lambda plasma cell myeloma, stage II by R-ISS: - Presentation to [facility] with left-sided rib pain on 12/29/2017, found to have hypercalcemia, anemia and acute kidney injury, further work-up showed 3.2 g/dL of IgG lambda monoclonal gammopathy. -Skeletal survey  reportedly demonstrated diffuse osteopenia and multiple lytic lesions in bilateral lower extremities. -Bone marrow biopsy on 12/31/2017 showed hypercellular marrow with 40 to 50% cellularity, involved by plasma cell neoplasm (17% plasma cells by manual aspirate differential count, greater than 90% and 30 to 40% by CD138 immunohistochemical analysis of clot and core respectively, and monotypic lambda by flow cytometry analysis) - Chromosome analysis showed 12 of 20 cells examined are normal, 46, X6.  The remaining 8 metaphase cells analyzed are abnormal and represent a hyperdiploid clone containing 1 extra copy each of chromosome 3, 5, 6, 7, 9, 11, 15, 19, 20, 21, 22 as well as a marker chromosome. -Multiple myeloma FISH panel shows nuclei positive for three 1q signals, trisomy 11, and extra signals of chromosome 7, 9 and 15. - Patient was recommended to start chemotherapy with port placement by Dr. Federico Flake at [facility].  She was recommended to have IV chemotherapy as she only has Medicare part A. Patient did not follow-up at [facility]  Current Status:  Pt seen today due to complaints of dizziness and questions regarding medications.      Multiple myeloma without remission ([place])   04/15/2018 Initial Diagnosis    Multiple myeloma without remission ([place])    04/15/2018 -  Chemotherapy    The patient had palonosetron (ALOXI) injection 0.25 mg, 0.25 mg, Intravenous,  Once, 1 of 4 cycles Administration: 0.25 mg (04/17/2018), 0.25 mg (04/24/2018), 0.25 mg (05/01/2018) cyclophosphamide (CYTOXAN) 540 mg in sodium chloride 0.9 % 250 mL chemo infusion, 300 mg/m2 = 540 mg, Intravenous,  Once, 1 of 4 cycles Administration: 540 mg (04/17/2018), 540 mg (04/24/2018), 540 mg (05/01/2018) carfilzomib (KYPROLIS) 36 mg in dextrose 5 % 50 mL chemo infusion, 20 mg/m2 = 36 mg, Intravenous, Once, 1 of 4 cycles Administration: 36 mg (04/17/2018), 36 mg (04/18/2018), 60 mg (04/24/2018), 60 mg (04/25/2018), 60 mg (05/01/2018), 60 mg  (05/02/2018)  for chemotherapy treatment.       Problem List Patient Active Problem List   Diagnosis Date Noted  . Multiple myeloma without remission ([place]) [C90.00] 04/15/2018  . Goals of care, counseling/discussion [Z71.89] 04/15/2018  . Multiple myeloma not having achieved remission ([place]) [C90.00] 04/07/2018    Past Medical History Past Medical History:  Diagnosis Date  . Multiple myeloma ([place])    multiple myeloma    Past Surgical History Past Surgical History:  Procedure Laterality Date  . ABDOMINAL HYSTERECTOMY     total  . PORTACATH PLACEMENT Right 04/14/2018   Procedure: INSERTION PORT-A-CATH;  Surgeon: [name], MD;  Location: [facility];  Service: General;  Laterality: Right;    Family History Family History  Problem Relation Age of Onset  . Heart disease Mother   . Emphysema Father   . Diabetes Sister   . Cancer Brother        liver, lung, and colon  . Diabetes Brother      Social History  reports that she has never smoked. She has never used smokeless tobacco. She reports  that she has current or past drug history. She reports that she does not drink alcohol.  Medications  Current Outpatient Medications:  .  acyclovir (ZOVIRAX) 400 MG tablet, Take 1 tablet (400 mg total) by mouth daily., Disp: 30 tablet, Rfl: 3 .  allopurinol (ZYLOPRIM) 300 MG tablet, Take 1 tablet (300 mg total) by mouth daily., Disp: 30 tablet, Rfl: 1 .  ASPIRIN 81 PO, Take 81 mg by mouth daily., Disp: , Rfl:  .  Calcium Carb-Cholecalciferol (CALCIUM 1000 + D PO), Take 1,000 mg by mouth daily. Take one tablet daily until next visit with oncologist., Disp: , Rfl:  .  Carfilzomib (KYPROLIS IV), Inject into the vein., Disp: , Rfl:  .  cyclophosphamide in sodium chloride 0.9 % 250 mL, Inject into the vein once., Disp: , Rfl:  .  dexamethasone (DECADRON) 4 MG tablet, Take 5 tablets (20 mg) on days 1, 2, 8, 9, 15 and 16 on the days of chemotherapy.  Take 10 tablets during the week of no  chemotherapy., Disp: 40 tablet, Rfl: 4 .  eszopiclone (LUNESTA) 1 MG TABS tablet, Take 1 tablet (1 mg total) by mouth at bedtime as needed for sleep. Take immediately before bedtime, Disp: 15 tablet, Rfl: 0 .  HYDROcodone-acetaminophen (NORCO) 5-325 MG tablet, Take 1 tablet by mouth every 6 (six) hours as needed for moderate pain., Disp: 60 tablet, Rfl: 0 .  lactulose (CHRONULAC) 10 GM/15ML solution, Take 15 to 30 ml at bedtime every night to assist with moving bowels.  Titrate down if having multiple bowel movements.  If bowels hav not moved in 3 to 4 days or longer-may give 15 to 30 ml every 2 hours until bowlel movement then resume taking 15 to 30 ml every night., Disp: 240 mL, Rfl: 0 .  lenalidomide (REVLIMID) 10 MG capsule, Take 1 capsule (10 mg total) by mouth daily., Disp: 14 capsule, Rfl: 0 .  lidocaine-prilocaine (EMLA) cream, Apply to affected area once, Disp: 30 g, Rfl: 3 .  lisinopril (PRINIVIL,ZESTRIL) 10 MG tablet, Take 1 tablet (10 mg total) by mouth daily., Disp: 30 tablet, Rfl: 1 .  ondansetron (ZOFRAN) 4 MG tablet, Take 1 tablet (4 mg total) by mouth every 8 (eight) hours as needed for nausea or vomiting., Disp: 20 tablet, Rfl: 1 .  pantoprazole (PROTONIX) 40 MG tablet, Take 1 tablet (40 mg total) by mouth daily., Disp: 10 tablet, Rfl: 0 .  prochlorperazine (COMPAZINE) 10 MG tablet, Take 1 tablet (10 mg total) by mouth every 6 (six) hours as needed (Nausea or vomiting)., Disp: 30 tablet, Rfl: 1 .  promethazine (PHENERGAN) 25 MG tablet, Take 1 tablet (25 mg total) by mouth every 6 (six) hours as needed., Disp: 15 tablet, Rfl: 0 .  temazepam (RESTORIL) 15 MG capsule, Take 1 capsule (15 mg total) by mouth at bedtime as needed for sleep., Disp: 30 capsule, Rfl: 0 .  traMADol (ULTRAM) 50 MG tablet, Take 1 tablet (50 mg total) by mouth every 6 (six) hours as needed., Disp: 60 tablet, Rfl: 0 .  trolamine salicylate (ASPERCREME) 10 % cream, Apply 1 application topically daily as needed for  muscle pain., Disp: , Rfl:  .  zolpidem (AMBIEN) 10 MG tablet, Take 1 tablet (10 mg total) by mouth at bedtime as needed for sleep., Disp: 30 tablet, Rfl: 0 No current facility-administered medications for this visit.   Facility-Administered Medications Ordered in Other Visits:  .  0.9 %  sodium chloride infusion, , Intravenous, Continuous, Zaccheus Edmister, Mathis Dad, MD,  Stopped at 05/08/18 1218  Allergies Morphine and related  Review of Systems Review of Systems - Oncology ROS negative other than dizziness and questions regarding medications.     Physical Exam  Vitals Wt Readings from Last 3 Encounters:  05/08/18 162 lb 6.4 oz (73.7 kg)  05/02/18 166 lb 6.4 oz (75.5 kg)  05/01/18 162 lb (73.5 kg)   Temp Readings from Last 3 Encounters:  05/08/18 98.6 F (37 C) (Oral)  05/08/18 (!) 97.4 F (36.3 C) (Oral)  05/05/18 98.8 F (37.1 C) (Oral)   BP Readings from Last 3 Encounters:  05/08/18 (!) 147/74  05/08/18 121/73  05/05/18 (!) 167/67   Pulse Readings from Last 3 Encounters:  05/08/18 83  05/08/18 86  05/05/18 93   Constitutional: Well-developed, well-nourished, and in no distress.   HENT: Head: Normocephalic and atraumatic.  Mouth/Throat: No oropharyngeal exudate. Mucosa moist. Eyes: Pupils are equal, round, and reactive to light. Conjunctivae are normal. No scleral icterus.  Neck: Normal range of motion. Neck supple. No JVD present.  Cardiovascular: Normal rate, regular rhythm and normal heart sounds.  Exam reveals no gallop and no friction rub.   No murmur heard. Pulmonary/Chest: Effort normal and breath sounds normal. No respiratory distress. No wheezes.No rales.  Abdominal: Soft. Bowel sounds are normal. No distension. There is no tenderness. There is no guarding.  Musculoskeletal: No edema or tenderness.  Lymphadenopathy: No cervical, axillary or supraclavicular adenopathy.  Neurological: Alert and oriented to person, place, and time. No cranial nerve deficit.  Skin: Skin  is warm and dry. No rash noted. No erythema. No pallor.  Psychiatric: Affect and judgment normal.   Labs No visits with results within 3 Day(s) from this visit.  Latest known visit with results is:  Infusion on 05/05/2018  Component Date Value Ref Range Status  . Calcium, Ionized, Serum 05/05/2018 4.3* 4.5 - 5.6 mg/dL Final   Comment: (NOTE) Performed At: [facility] [place], [place] [id number] Rush Farmer MD NI:[id number]      Pathology No orders of the defined types were placed in this encounter.      Zoila Shutter MD

## 2018-05-09 MED ORDER — PANTOPRAZOLE SODIUM 40 MG PO TBEC: 40.0000 mg | DELAYED_RELEASE_TABLET | Freq: Every day | ORAL | 0 refills | Status: DC

## 2018-05-09 NOTE — Addendum Note (Signed)
Addended by: [place] on: 05/09/2018 09:51 AM   Modules accepted: Orders

## 2018-05-11 ENCOUNTER — Emergency Department (HOSPITAL_COMMUNITY): Payer: Medicaid Other

## 2018-05-11 ENCOUNTER — Encounter (HOSPITAL_COMMUNITY): Payer: Self-pay | Admitting: Emergency Medicine

## 2018-05-11 ENCOUNTER — Other Ambulatory Visit: Payer: Self-pay

## 2018-05-11 ENCOUNTER — Emergency Department (HOSPITAL_COMMUNITY)
Admission: EM | Admit: 2018-05-11 | Discharge: 2018-05-12 | Disposition: A | Discharge status: Home / Self Care | Payer: Medicaid Other | Attending: Emergency Medicine | Admitting: Emergency Medicine

## 2018-05-11 DIAGNOSIS — C9000 Multiple myeloma not having achieved remission: Secondary | ICD-10-CM | POA: Diagnosis not present

## 2018-05-11 DIAGNOSIS — R1013 Epigastric pain: Secondary | ICD-10-CM | POA: Insufficient documentation

## 2018-05-11 DIAGNOSIS — R110 Nausea: Secondary | ICD-10-CM | POA: Diagnosis not present

## 2018-05-11 DIAGNOSIS — Z79899 Other long term (current) drug therapy: Secondary | ICD-10-CM | POA: Diagnosis not present

## 2018-05-11 LAB — URINALYSIS, ROUTINE W REFLEX MICROSCOPIC
Bilirubin Urine: NEGATIVE
Glucose, UA: NEGATIVE mg/dL
Hgb urine dipstick: NEGATIVE
KETONES UR: NEGATIVE mg/dL
Nitrite: NEGATIVE
PH: 5 (ref 5.0–8.0)
Protein, ur: NEGATIVE mg/dL
SPECIFIC GRAVITY, URINE: 1.009 (ref 1.005–1.030)

## 2018-05-11 LAB — COMPREHENSIVE METABOLIC PANEL
ALBUMIN: 3.1 g/dL — AB (ref 3.5–5.0)
ALK PHOS: 197 U/L — AB (ref 38–126)
ALT: 10 U/L (ref 0–44)
AST: 12 U/L — AB (ref 15–41)
Anion gap: 10 (ref 5–15)
BILIRUBIN TOTAL: 0.5 mg/dL (ref 0.3–1.2)
BUN: 47 mg/dL — AB (ref 8–23)
CALCIUM: 8.6 mg/dL — AB (ref 8.9–10.3)
CO2: 22 mmol/L (ref 22–32)
CREATININE: 2.44 mg/dL — AB (ref 0.44–1.00)
Chloride: 102 mmol/L (ref 98–111)
GFR calc Af Amer: 22 mL/min — ABNORMAL LOW (ref >60)
GFR calc non Af Amer: 19 mL/min — ABNORMAL LOW (ref >60)
GLUCOSE: 119 mg/dL — AB (ref 70–99)
Potassium: 4.7 mmol/L (ref 3.5–5.1)
Sodium: 134 mmol/L — ABNORMAL LOW (ref 135–145)
TOTAL PROTEIN: 7.8 g/dL (ref 6.5–8.1)

## 2018-05-11 LAB — CBC WITH DIFFERENTIAL/PLATELET
BASOS PCT: 0 %
Basophils Absolute: 0 10*3/uL (ref 0.0–0.1)
EOS PCT: 1 %
Eosinophils Absolute: 0.1 10*3/uL (ref 0.0–0.7)
HCT: 28.3 % — ABNORMAL LOW (ref 36.0–46.0)
HEMOGLOBIN: 9.2 g/dL — AB (ref 12.0–15.0)
Lymphocytes Relative: 14 %
Lymphs Abs: 0.9 10*3/uL (ref 0.7–4.0)
MCH: 30.3 pg (ref 26.0–34.0)
MCHC: 32.5 g/dL (ref 30.0–36.0)
MCV: 93.1 fL (ref 78.0–100.0)
MONOS PCT: 12 %
Monocytes Absolute: 0.8 10*3/uL (ref 0.1–1.0)
NEUTROS ABS: 4.6 10*3/uL (ref 1.7–7.7)
Neutrophils Relative %: 73 %
PLATELETS: 304 10*3/uL (ref 150–400)
RBC: 3.04 MIL/uL — ABNORMAL LOW (ref 3.87–5.11)
RDW: 14 % (ref 11.5–15.5)
WBC: 6.3 10*3/uL (ref 4.0–10.5)

## 2018-05-11 LAB — TROPONIN I

## 2018-05-11 LAB — LIPASE, BLOOD: LIPASE: 82 U/L — AB (ref 11–51)

## 2018-05-11 MED ORDER — SODIUM CHLORIDE 0.9 % IV BOLUS
500.0000 mL | Freq: Once | INTRAVENOUS | Status: AC
  Administered 2018-05-11: 500 mL via INTRAVENOUS

## 2018-05-11 MED ORDER — IOPAMIDOL (ISOVUE-300) INJECTION 61%
30.0000 mL | Freq: Once | INTRAVENOUS | Status: AC | PRN
  Administered 2018-05-11: 30 mL via ORAL

## 2018-05-11 MED ORDER — PROMETHAZINE HCL 25 MG/ML IJ SOLN
12.5000 mg | Freq: Once | INTRAMUSCULAR | Status: AC
  Administered 2018-05-11: 12.5 mg via INTRAVENOUS
  Filled 2018-05-11: qty 1

## 2018-05-11 MED ORDER — SODIUM CHLORIDE 0.9 % IV SOLN
INTRAVENOUS | Status: DC
  Administered 2018-05-11: 21:00:00 via INTRAVENOUS

## 2018-05-11 MED ORDER — FAMOTIDINE IN NACL 20-0.9 MG/50ML-% IV SOLN
20.0000 mg | Freq: Once | INTRAVENOUS | Status: AC
  Administered 2018-05-11: 20 mg via INTRAVENOUS
  Filled 2018-05-11: qty 50

## 2018-05-11 MED ORDER — ONDANSETRON HCL 4 MG/2ML IJ SOLN
4.0000 mg | INTRAMUSCULAR | Status: DC | PRN
  Administered 2018-05-11: 4 mg via INTRAVENOUS
  Filled 2018-05-11: qty 2

## 2018-05-11 NOTE — ED Provider Notes (Signed)
[facility] EMERGENCY DEPARTMENT Provider Note   CSN: [id number] Arrival date & time: 05/11/18  1925     History   Chief Complaint Chief Complaint  Patient presents with  . Abdominal Pain    HPI Alexandra Price is a 72 y.o. female.  HPI  Pt was seen at [place].  Per pt, c/o gradual onset and persistence of constant upper abd "pain" for the past 3 to 4 days.  Has been associated with nausea and constipation. Describes the abd pain as "pressure."  States she took "some laxatives" without resulting BM. Pt was evaluated by her Onc MD 05/05/2018 for same, received IVF and nausea meds.  Hx MM, LD chemo 05/02/2018.  Denies vomiting/diarrhea, no fevers, no back pain, no rash, no CP/SOB, no black or blood in stools.      Past Medical History:  Diagnosis Date  . Multiple myeloma ([place])    multiple myeloma    Patient Active Problem List   Diagnosis Date Noted  . Multiple myeloma without remission ([place]) 04/15/2018  . Goals of care, counseling/discussion 04/15/2018  . Multiple myeloma not having achieved remission ([place]) 04/07/2018    Past Surgical History:  Procedure Laterality Date  . ABDOMINAL HYSTERECTOMY     total  . PORTACATH PLACEMENT Right 04/14/2018   Procedure: INSERTION PORT-A-CATH;  Surgeon: [name], MD;  Location: [facility];  Service: General;  Laterality: Right;     OB History   None      Home Medications    Prior to Admission medications   Medication Sig Start Date End Date Taking? Authorizing Provider  acyclovir (ZOVIRAX) 400 MG tablet Take 1 tablet (400 mg total) by mouth daily. 04/15/18   [name], MD  allopurinol (ZYLOPRIM) 300 MG tablet Take 1 tablet (300 mg total) by mouth daily. 04/24/18   [name], MD  ASPIRIN 81 PO Take 81 mg by mouth daily.    [provider]  Calcium Carb-Cholecalciferol (CALCIUM 1000 + D PO) Take 1,000 mg by mouth daily. Take one tablet daily until next visit with oncologist.    [provider]    Carfilzomib (KYPROLIS IV) Inject into the vein.    [provider]  cyclophosphamide in sodium chloride 0.9 % 250 mL Inject into the vein once.    [provider]  dexamethasone (DECADRON) 4 MG tablet Take 5 tablets (20 mg) on days 1, 2, 8, 9, 15 and 16 on the days of chemotherapy.  Take 10 tablets during the week of no chemotherapy. 04/15/18   [name], MD  eszopiclone (LUNESTA) 1 MG TABS tablet Take 1 tablet (1 mg total) by mouth at bedtime as needed for sleep. Take immediately before bedtime 04/28/18   Higgs, Mathis Dad, MD  HYDROcodone-acetaminophen (NORCO) 5-325 MG tablet Take 1 tablet by mouth every 6 (six) hours as needed for moderate pain. 04/15/18   Lockamy, Randi L, NP-C  lactulose (CHRONULAC) 10 GM/15ML solution Take 15 to 30 ml at bedtime every night to assist with moving bowels.  Titrate down if having multiple bowel movements.  If bowels hav not moved in 3 to 4 days or longer-may give 15 to 30 ml every 2 hours until bowlel movement then resume taking 15 to 30 ml every night. 04/25/18   [name], MD  lenalidomide (REVLIMID) 10 MG capsule Take 1 capsule (10 mg total) by mouth daily. 04/15/18   [name], MD  lidocaine-prilocaine (EMLA) cream Apply to affected area once 04/15/18   [name], MD  lisinopril (PRINIVIL,ZESTRIL) 10 MG tablet Take 1 tablet (10 mg total) by mouth daily. 04/10/18   Nat Christen, MD  ondansetron (ZOFRAN) 4 MG tablet Take 1 tablet (4 mg total) by mouth every 8 (eight) hours as needed for nausea or vomiting. 04/22/18   Lockamy, Randi L, NP-C  pantoprazole (PROTONIX) 40 MG tablet Take 1 tablet (40 mg total) by mouth daily. 05/09/18   Lockamy, Randi L, NP-C  prochlorperazine (COMPAZINE) 10 MG tablet Take 1 tablet (10 mg total) by mouth every 6 (six) hours as needed (Nausea or vomiting). 04/15/18   [name], MD  promethazine (PHENERGAN) 25 MG tablet Take 1 tablet (25 mg total) by mouth every 6 (six) hours as  needed. 04/27/18   Nat Christen, MD  temazepam (RESTORIL) 15 MG capsule Take 1 capsule (15 mg total) by mouth at bedtime as needed for sleep. 04/24/18   Lockamy, Randi L, NP-C  traMADol (ULTRAM) 50 MG tablet Take 1 tablet (50 mg total) by mouth every 6 (six) hours as needed. 04/15/18   Lockamy, Randi L, NP-C  trolamine salicylate (ASPERCREME) 10 % cream Apply 1 application topically daily as needed for muscle pain.    [provider]  zolpidem (AMBIEN) 10 MG tablet Take 1 tablet (10 mg total) by mouth at bedtime as needed for sleep. 05/02/18 06/01/18  Higgs, Mathis Dad, MD    Family History Family History  Problem Relation Age of Onset  . Heart disease Mother   . Emphysema Father   . Diabetes Sister   . Cancer Brother        liver, lung, and colon  . Diabetes Brother     Social History Social History   Tobacco Use  . Smoking status: Never Smoker  . Smokeless tobacco: Never Used  Substance Use Topics  . Alcohol use: Never    Frequency: Never  . Drug use: Not Currently     Allergies   Morphine and related   Review of Systems Review of Systems ROS: Statement: All systems negative except as marked or noted in the HPI; Constitutional: Negative for fever and chills. ; ; Eyes: Negative for eye pain, redness and discharge. ; ; ENMT: Negative for ear pain, hoarseness, nasal congestion, sinus pressure and sore throat. ; ; Cardiovascular: Negative for chest pain, palpitations, diaphoresis, dyspnea and peripheral edema. ; ; Respiratory: Negative for cough, wheezing and stridor. ; ; Gastrointestinal: +nausea, constipation, abd pain. Negative for vomiting, diarrhea, blood in stool, hematemesis, jaundice and rectal bleeding. . ; ; Genitourinary: Negative for dysuria, flank pain and hematuria. ; ; Musculoskeletal: Negative for back pain and neck pain. Negative for swelling and trauma.; ; Skin: Negative for pruritus, rash, abrasions, blisters, bruising and skin lesion.; ; Neuro: Negative for  headache, lightheadedness and neck stiffness. Negative for weakness, altered level of consciousness, altered mental status, extremity weakness, paresthesias, involuntary movement, seizure and syncope.       Physical Exam Updated Vital Signs BP (!) 143/77 (BP Location: Right Arm)   Pulse (!) 104   Temp 98.4 F (36.9 C) (Oral)   Resp 20   Ht 5' 5"  (1.651 m)   Wt 73.5 kg   SpO2 98%   BMI 26.96 kg/m    BP 140/69   Pulse 87   Temp 98.4 F (36.9 C) (Oral)   Resp 14   Ht 5' 5"  (1.651 m)   Wt 73.5 kg   SpO2 99%   BMI 26.96 kg/m    Physical Exam 1943: Physical examination:  Nursing notes reviewed; Vital signs and O2 SAT reviewed;  Constitutional: Well developed, Well nourished, Well hydrated, In no acute distress; Head:  Normocephalic, atraumatic; Eyes: EOMI, PERRL, No scleral icterus; ENMT: Mouth and pharynx normal, Mucous membranes moist; Neck: Supple, Full range of motion, No lymphadenopathy; Cardiovascular: Regular rate and rhythm, No gallop; Respiratory: Breath sounds clear & equal bilaterally, No wheezes.  Speaking full sentences with ease, Normal respiratory effort/excursion; Chest: Nontender, Movement normal; Abdomen: Soft, +mid-epigastric > LUQ, RUQ tenderness to palp. No rebound or guarding. Nondistended, Normal bowel sounds; Genitourinary: No CVA tenderness; Extremities: Peripheral pulses normal, No tenderness, No edema, No calf edema or asymmetry.; Neuro: AA&Ox3, Major CN grossly intact.  Speech clear. No gross focal motor or sensory deficits in extremities.; Skin: Color normal, Warm, Dry.   ED Treatments / Results  Labs (all labs ordered are listed, but only abnormal results are displayed)   EKG None  Radiology   Procedures Procedures (including critical care time)  Medications Ordered in ED Medications  sodium chloride 0.9 % bolus 500 mL (has no administration in time range)  0.9 %  sodium chloride infusion (has no administration in time range)  famotidine  (PEPCID) IVPB 20 mg premix (has no administration in time range)  ondansetron (ZOFRAN) injection 4 mg (has no administration in time range)     Initial Impression / Assessment and Plan / ED Course  I have reviewed the triage vital signs and the nursing notes.  Pertinent labs & imaging results that were available during my care of the patient were reviewed by me and considered in my medical decision making (see chart for details).  MDM Reviewed: previous chart, nursing note and vitals Reviewed previous: labs and ECG Interpretation: labs, ECG, x-ray and CT scan   ED ECG REPORT   Date: 05/11/2018  Rate: 95  Rhythm: normal sinus rhythm  QRS Axis: normal  Intervals: normal  ST/T Wave abnormalities: nonspecific ST/T changes  Conduction Disutrbances:none  Narrative Interpretation:   Old EKG Reviewed: unchanged; no significant changes since previous EKG dated 04/14/2018. I have personally reviewed the EKG tracing and agree with the computerized printout as noted.  Results for orders placed or performed during the hospital encounter of 05/11/18  Lipase, blood  Result Value Ref Range   Lipase 82 (H) 11 - 51 U/L  Comprehensive metabolic panel  Result Value Ref Range   Sodium 134 (L) 135 - 145 mmol/L   Potassium 4.7 3.5 - 5.1 mmol/L   Chloride 102 98 - 111 mmol/L   CO2 22 22 - 32 mmol/L   Glucose, Bld 119 (H) 70 - 99 mg/dL   BUN 47 (H) 8 - 23 mg/dL   Creatinine, Ser 2.44 (H) 0.44 - 1.00 mg/dL   Calcium 8.6 (L) 8.9 - 10.3 mg/dL   Total Protein 7.8 6.5 - 8.1 g/dL   Albumin 3.1 (L) 3.5 - 5.0 g/dL   AST 12 (L) 15 - 41 U/L   ALT 10 0 - 44 U/L   Alkaline Phosphatase 197 (H) 38 - 126 U/L   Total Bilirubin 0.5 0.3 - 1.2 mg/dL   GFR calc non Af Amer 19 (L) >60 mL/min   GFR calc Af Amer 22 (L) >60 mL/min   Anion gap 10 5 - 15  Urinalysis, Routine w reflex microscopic  Result Value Ref Range   Color, Urine STRAW (A) YELLOW   APPearance CLEAR CLEAR   Specific Gravity, Urine 1.009 1.005  - 1.030   pH 5.0 5.0 - 8.0  Glucose, UA NEGATIVE NEGATIVE mg/dL   Hgb urine dipstick NEGATIVE NEGATIVE   Bilirubin Urine NEGATIVE NEGATIVE   Ketones, ur NEGATIVE NEGATIVE mg/dL   Protein, ur NEGATIVE NEGATIVE mg/dL   Nitrite NEGATIVE NEGATIVE   Leukocytes, UA SMALL (A) NEGATIVE   RBC / HPF 0-5 0 - 5 RBC/hpf   WBC, UA 21-50 0 - 5 WBC/hpf   Bacteria, UA RARE (A) NONE SEEN   Squamous Epithelial / LPF 0-5 0 - 5   Non Squamous Epithelial 0-5 (A) NONE SEEN  CBC with Differential  Result Value Ref Range   WBC 6.3 4.0 - 10.5 K/uL   RBC 3.04 (L) 3.87 - 5.11 MIL/uL   Hemoglobin 9.2 (L) 12.0 - 15.0 g/dL   HCT 28.3 (L) 36.0 - 46.0 %   MCV 93.1 78.0 - 100.0 fL   MCH 30.3 26.0 - 34.0 pg   MCHC 32.5 30.0 - 36.0 g/dL   RDW 14.0 11.5 - 15.5 %   Platelets 304 150 - 400 K/uL   Neutrophils Relative % 73 %   Neutro Abs 4.6 1.7 - 7.7 K/uL   Lymphocytes Relative 14 %   Lymphs Abs 0.9 0.7 - 4.0 K/uL   Monocytes Relative 12 %   Monocytes Absolute 0.8 0.1 - 1.0 K/uL   Eosinophils Relative 1 %   Eosinophils Absolute 0.1 0.0 - 0.7 K/uL   Basophils Relative 0 %   Basophils Absolute 0.0 0.0 - 0.1 K/uL  Troponin I  Result Value Ref Range   Troponin I <0.03 <0.03 ng/mL   Ct Abdomen Pelvis Wo Contrast Result Date: 05/11/2018 CLINICAL DATA:  72 year old female with abdominal pain. History of multiple myeloma. EXAM: CT ABDOMEN AND PELVIS WITHOUT CONTRAST TECHNIQUE: Multidetector CT imaging of the abdomen and pelvis was performed following the standard protocol without IV contrast. COMPARISON:  Skeletal survey radiograph dated 04/07/2018 FINDINGS: Evaluation of this exam is limited in the absence of intravenous contrast. Lower chest: Left lung base linear atelectasis/scarring. The visualized lung bases are otherwise clear. There is hypoattenuation of the cardiac blood pool suggestive of a degree of anemia. Clinical correlation is recommended. The tip of Port-A-Cath is partially visualized in the region of the  cavoatrial junction. No intra-abdominal free air or free fluid. Hepatobiliary: No focal liver abnormality is seen. No gallstones, gallbladder wall thickening, or biliary dilatation. Pancreas: Punctate focus of calcification in the body of the pancreas may be related to vascular calcification or sequela of prior inflammation. The pancreas is otherwise unremarkable. Spleen: Normal in size without focal abnormality. Adrenals/Urinary Tract: Adrenal glands are unremarkable. Kidneys are normal, without renal calculi, focal lesion, or hydronephrosis. Bladder is unremarkable. Stomach/Bowel: There is loose stool within the colon compatible with diarrheal state. There is no bowel obstruction or active inflammation. Normal appendix. Vascular/Lymphatic: Mild aortoiliac atherosclerotic disease. The abdominal aorta and IVC are otherwise grossly unremarkable on this noncontrast CT. No portal venous gas. There is no adenopathy. Reproductive: Hysterectomy.  No pelvic mass. Other: None Musculoskeletal: Osteopenia. Innumerable lytic osseous lesions consistent with known multiple myeloma. There is compression fracture of the superior endplate of L4 with approximately 40% loss of vertebral body height centrally similar or progressed since the prior radiograph. There is compression fracture of the T11 with approximately 50% loss of vertebral body height and mild anterior wedging similar to prior radiograph. There are multiple old appearing left posterior rib fractures. No definite acute fracture. Evaluation for acute fractures however limited due to advanced osteopenia and extensive lytic lucencies. IMPRESSION: 1. Diarrheal  state. Correlation with clinical exam and stool cultures recommended. No bowel obstruction. Normal appendix. 2. Innumerable osseous lytic lesions in keeping with known multiple myeloma. Known T11 and L4 compression fractures as well as multiple old-appearing left posterior rib fractures. No definite acute fracture.  Electronically Signed   By: Anner Crete M.D.   On: 05/11/2018 23:00   Dg Chest 2 View Result Date: 05/11/2018 CLINICAL DATA:  Abdominal pain EXAM: CHEST - 2 VIEW COMPARISON:  04/27/2018 FINDINGS: Right Port-A-Cath remains in place, unchanged. Heart is normal size. No confluent airspace opacities or effusions. Numerous lucencies throughout the visualized skeletal structures compatible with diffuse widespread myeloma. IMPRESSION: No acute cardiopulmonary disease. Electronically Signed   By: Rolm Baptise M.D.   On: 05/11/2018 22:57    2315:  Labs per baseline. No clear UTI on Udip and pt denies urinary symptoms; UC pending. CT reassuring. Pt has tol PO well while in the ED without N/V.  No stooling while in the ED.  Abd benign, VSS. Pt states she feels better and wants to go home now. States she "has enough" anti-nausea meds. No clear indication for admission at this time. Tx symptomatically, f/u Onc MD. Dx and testing d/w pt and family.  Questions answered.  Verb understanding, agreeable to d/c home with outpt f/u.     Final Clinical Impressions(s) / ED Diagnoses   Final diagnoses:  None    ED Discharge Orders    None       Francine Graven, DO 05/15/18 [id number]

## 2018-05-11 NOTE — ED Triage Notes (Signed)
Pt states she has been having abd pain, chronic in nature, LBM 3 days ago. Pt currently on chemo, tx last Friday.

## 2018-05-11 NOTE — ED Notes (Signed)
Patient transported to CT 

## 2018-05-11 NOTE — Discharge Instructions (Addendum)
Take your usual prescriptions as previously directed.  Increase your fluid intake (ie:  Gatoraide) for the next few days.  Eat a bland diet and advance to your regular diet slowly as you can tolerate it.  Call your regular Oncologist tomorrow morning to schedule a follow up appointment in the next 2 to 3 days.  Return to the Emergency Department immediately sooner if worsening.

## 2018-05-12 ENCOUNTER — Emergency Department (HOSPITAL_COMMUNITY)
Admission: EM | Admit: 2018-05-12 | Discharge: 2018-05-12 | Disposition: A | Discharge status: Home / Self Care | Payer: Medicaid Other | Source: Home / Self Care | Attending: Emergency Medicine | Admitting: Emergency Medicine

## 2018-05-12 ENCOUNTER — Encounter (HOSPITAL_COMMUNITY): Payer: Self-pay | Admitting: *Deleted

## 2018-05-12 ENCOUNTER — Encounter (HOSPITAL_COMMUNITY): Payer: Self-pay | Admitting: Emergency Medicine

## 2018-05-12 ENCOUNTER — Other Ambulatory Visit: Payer: Self-pay

## 2018-05-12 DIAGNOSIS — G4700 Insomnia, unspecified: Secondary | ICD-10-CM

## 2018-05-12 DIAGNOSIS — F419 Anxiety disorder, unspecified: Secondary | ICD-10-CM

## 2018-05-12 DIAGNOSIS — Z79899 Other long term (current) drug therapy: Secondary | ICD-10-CM

## 2018-05-12 DIAGNOSIS — C9000 Multiple myeloma not having achieved remission: Secondary | ICD-10-CM | POA: Insufficient documentation

## 2018-05-12 HISTORY — DX: Depression, unspecified: F32.A

## 2018-05-12 HISTORY — DX: Major depressive disorder, single episode, unspecified: F32.9

## 2018-05-12 HISTORY — DX: Anxiety disorder, unspecified: F41.9

## 2018-05-12 LAB — URINALYSIS, ROUTINE W REFLEX MICROSCOPIC
BILIRUBIN URINE: NEGATIVE
GLUCOSE, UA: NEGATIVE mg/dL
HGB URINE DIPSTICK: NEGATIVE
Ketones, ur: NEGATIVE mg/dL
Leukocytes, UA: NEGATIVE
Nitrite: NEGATIVE
PH: 5 (ref 5.0–8.0)
Protein, ur: NEGATIVE mg/dL
SPECIFIC GRAVITY, URINE: 1.01 (ref 1.005–1.030)

## 2018-05-12 LAB — RAPID URINE DRUG SCREEN, HOSP PERFORMED
AMPHETAMINES: NOT DETECTED
BARBITURATES: NOT DETECTED
Benzodiazepines: POSITIVE — AB
Cocaine: NOT DETECTED
Opiates: NOT DETECTED
TETRAHYDROCANNABINOL: NOT DETECTED

## 2018-05-12 MED ORDER — CLONAZEPAM 0.5 MG PO TABS: 0.5000 mg | ORAL_TABLET | Freq: Every evening | ORAL | 0 refills | Status: DC | PRN

## 2018-05-12 NOTE — ED Provider Notes (Signed)
[facility] Provider Note   CSN: [id number] Arrival date & time: 05/12/18  1111     History   Chief Complaint Chief Complaint  Patient presents with  . V70.1    HPI Alexandra Price is a 72 y.o. female.  HPI Patient with history of multiple myeloma.  States she has had depressed move for the last month since he started back chemotherapy.  Denies suicidal ideation.  States he is having difficulty sleeping though she is tried multiple different sleep aids.  Currently is taking Ambien with minimal help.  States she was called by her cancer doctors office and told to come to the emergency department for psychiatric evaluation.  States she has an appointment with a therapist on 9/30. Past Medical History:  Diagnosis Date  . Anxiety   . Depression   . Multiple myeloma ([place])    multiple myeloma    Patient Active Problem List   Diagnosis Date Noted  . Multiple myeloma without remission ([place]) 04/15/2018  . Goals of care, counseling/discussion 04/15/2018  . Multiple myeloma not having achieved remission ([place]) 04/07/2018    Past Surgical History:  Procedure Laterality Date  . ABDOMINAL HYSTERECTOMY     total  . PORTACATH PLACEMENT Right 04/14/2018   Procedure: INSERTION PORT-A-CATH;  Surgeon: [name], MD;  Location: [facility];  Service: General;  Laterality: Right;     OB History   None      Home Medications    Prior to Admission medications   Medication Sig Start Date End Date Taking? Authorizing Provider  acyclovir (ZOVIRAX) 400 MG tablet Take 1 tablet (400 mg total) by mouth daily. 04/15/18   [name], MD  allopurinol (ZYLOPRIM) 300 MG tablet Take 1 tablet (300 mg total) by mouth daily. 04/24/18   [name], MD  ASPIRIN 81 PO Take 81 mg by mouth daily.    [provider]  Calcium Carb-Cholecalciferol (CALCIUM 1000 + D PO) Take 1,000 mg by mouth daily. Take one tablet daily until next visit with oncologist.     [provider]  Carfilzomib (KYPROLIS IV) Inject into the vein.    [provider]  clonazePAM (KLONOPIN) 0.5 MG tablet Take 1 tablet (0.5 mg total) by mouth at bedtime as needed for anxiety (insomnia). 05/12/18   Julianne Rice, MD  cyclophosphamide in sodium chloride 0.9 % 250 mL Inject into the vein once.    [provider]  dexamethasone (DECADRON) 4 MG tablet Take 5 tablets (20 mg) on days 1, 2, 8, 9, 15 and 16 on the days of chemotherapy.  Take 10 tablets during the week of no chemotherapy. 04/15/18   [name], MD  HYDROcodone-acetaminophen (NORCO) 5-325 MG tablet Take 1 tablet by mouth every 6 (six) hours as needed for moderate pain. 04/15/18   Lockamy, Randi L, NP-C  lactulose (CHRONULAC) 10 GM/15ML solution Take 15 to 30 ml at bedtime every night to assist with moving bowels.  Titrate down if having multiple bowel movements.  If bowels hav not moved in 3 to 4 days or longer-may give 15 to 30 ml every 2 hours until bowlel movement then resume taking 15 to 30 ml every night. 04/25/18   [name], MD  lenalidomide (REVLIMID) 10 MG capsule Take 1 capsule (10 mg total) by mouth daily. 04/15/18   [name], MD  lidocaine-prilocaine (EMLA) cream Apply to affected area once 04/15/18   [name], MD  lisinopril (PRINIVIL,ZESTRIL) 10 MG tablet Take 1 tablet (10  mg total) by mouth daily. 04/10/18   Nat Christen, MD  ondansetron (ZOFRAN) 4 MG tablet Take 1 tablet (4 mg total) by mouth every 8 (eight) hours as needed for nausea or vomiting. 04/22/18   Lockamy, Randi L, NP-C  pantoprazole (PROTONIX) 40 MG tablet Take 1 tablet (40 mg total) by mouth daily. 05/09/18   Lockamy, Randi L, NP-C  prochlorperazine (COMPAZINE) 10 MG tablet Take 1 tablet (10 mg total) by mouth every 6 (six) hours as needed (Nausea or vomiting). 04/15/18   [name], MD  promethazine (PHENERGAN) 25 MG tablet Take 1 tablet (25 mg total) by mouth every 6 (six) hours  as needed. 04/27/18   Nat Christen, MD  temazepam (RESTORIL) 15 MG capsule Take 1 capsule (15 mg total) by mouth at bedtime as needed for sleep. 04/24/18   Lockamy, Randi L, NP-C  traMADol (ULTRAM) 50 MG tablet Take 1 tablet (50 mg total) by mouth every 6 (six) hours as needed. 04/15/18   Lockamy, Randi L, NP-C  trolamine salicylate (ASPERCREME) 10 % cream Apply 1 application topically daily as needed for muscle pain.    [provider]    Family History Family History  Problem Relation Age of Onset  . Heart disease Mother   . Emphysema Father   . Diabetes Sister   . Cancer Brother        liver, lung, and colon  . Diabetes Brother     Social History Social History   Tobacco Use  . Smoking status: Never Smoker  . Smokeless tobacco: Never Used  Substance Use Topics  . Alcohol use: Never    Frequency: Never  . Drug use: Not Currently     Allergies   Morphine and related   Review of Systems Review of Systems  Constitutional: Negative for chills and fever.  HENT: Negative for trouble swallowing.   Eyes: Negative for visual disturbance.  Respiratory: Negative for cough and shortness of breath.   Cardiovascular: Negative for chest pain.  Gastrointestinal: Negative for abdominal pain, diarrhea, nausea and vomiting.  Genitourinary: Negative for dysuria and flank pain.  Musculoskeletal: Negative for back pain, myalgias and neck pain.  Skin: Negative for rash and wound.  Neurological: Negative for dizziness, weakness, numbness and headaches.  Psychiatric/Behavioral: Positive for dysphoric mood and sleep disturbance. Negative for suicidal ideas. The patient is nervous/anxious.   All other systems reviewed and are negative.    Physical Exam Updated Vital Signs BP 140/75 (BP Location: Left Arm)   Pulse 94   Temp 98.1 F (36.7 C) (Oral)   Resp 16   Ht 5' 5" (1.651 m)   Wt 74 kg   SpO2 99%   BMI 27.15 kg/m   Physical Exam  Constitutional: She is oriented to person,  place, and time. She appears well-developed and well-nourished. No distress.  HENT:  Head: Normocephalic and atraumatic.  Mouth/Throat: Oropharynx is clear and moist.  Eyes: Pupils are equal, round, and reactive to light. EOM are normal.  Neck: Normal range of motion. Neck supple.  Cardiovascular: Normal rate and regular rhythm. Exam reveals no gallop and no friction rub.  No murmur heard. Pulmonary/Chest: Effort normal and breath sounds normal. No respiratory distress. She has no wheezes. She has no rales.  Abdominal: Soft. Bowel sounds are normal. She exhibits no distension and no mass. There is no tenderness. There is no rebound and no guarding. No hernia.  Musculoskeletal: Normal range of motion. She exhibits no edema or tenderness.  Neurological: She  is alert and oriented to person, place, and time.  Moving all extremities without focal deficit.  Sensation intact.  Skin: Skin is warm and dry. Capillary refill takes less than 2 seconds. No rash noted. She is not diaphoretic. No erythema.  Psychiatric: She has a normal mood and affect. Her behavior is normal.  Flat affect.  Denies SI or HI.  Nursing note and vitals reviewed.    ED Treatments / Results  Labs (all labs ordered are listed, but only abnormal results are displayed) Labs Reviewed  URINALYSIS, ROUTINE W REFLEX MICROSCOPIC - Abnormal; Notable for the following components:      Result Value   Color, Urine STRAW (*)    All other components within normal limits  RAPID URINE DRUG SCREEN, HOSP PERFORMED - Abnormal; Notable for the following components:   Benzodiazepines POSITIVE (*)    All other components within normal limits    EKG None  Radiology Ct Abdomen Pelvis Wo Contrast  Result Date: 05/11/2018 CLINICAL DATA:  72 year old female with abdominal pain. History of multiple myeloma. EXAM: CT ABDOMEN AND PELVIS WITHOUT CONTRAST TECHNIQUE: Multidetector CT imaging of the abdomen and pelvis was performed following the  standard protocol without IV contrast. COMPARISON:  Skeletal survey radiograph dated 04/07/2018 FINDINGS: Evaluation of this exam is limited in the absence of intravenous contrast. Lower chest: Left lung base linear atelectasis/scarring. The visualized lung bases are otherwise clear. There is hypoattenuation of the cardiac blood pool suggestive of a degree of anemia. Clinical correlation is recommended. The tip of Port-A-Cath is partially visualized in the region of the cavoatrial junction. No intra-abdominal free air or free fluid. Hepatobiliary: No focal liver abnormality is seen. No gallstones, gallbladder wall thickening, or biliary dilatation. Pancreas: Punctate focus of calcification in the body of the pancreas may be related to vascular calcification or sequela of prior inflammation. The pancreas is otherwise unremarkable. Spleen: Normal in size without focal abnormality. Adrenals/Urinary Tract: Adrenal glands are unremarkable. Kidneys are normal, without renal calculi, focal lesion, or hydronephrosis. Bladder is unremarkable. Stomach/Bowel: There is loose stool within the colon compatible with diarrheal state. There is no bowel obstruction or active inflammation. Normal appendix. Vascular/Lymphatic: Mild aortoiliac atherosclerotic disease. The abdominal aorta and IVC are otherwise grossly unremarkable on this noncontrast CT. No portal venous gas. There is no adenopathy. Reproductive: Hysterectomy.  No pelvic mass. Other: None Musculoskeletal: Osteopenia. Innumerable lytic osseous lesions consistent with known multiple myeloma. There is compression fracture of the superior endplate of L4 with approximately 40% loss of vertebral body height centrally similar or progressed since the prior radiograph. There is compression fracture of the T11 with approximately 50% loss of vertebral body height and mild anterior wedging similar to prior radiograph. There are multiple old appearing left posterior rib fractures. No  definite acute fracture. Evaluation for acute fractures however limited due to advanced osteopenia and extensive lytic lucencies. IMPRESSION: 1. Diarrheal state. Correlation with clinical exam and stool cultures recommended. No bowel obstruction. Normal appendix. 2. Innumerable osseous lytic lesions in keeping with known multiple myeloma. Known T11 and L4 compression fractures as well as multiple old-appearing left posterior rib fractures. No definite acute fracture. Electronically Signed   By: Anner Crete M.D.   On: 05/11/2018 23:00   Dg Chest 2 View  Result Date: 05/11/2018 CLINICAL DATA:  Abdominal pain EXAM: CHEST - 2 VIEW COMPARISON:  04/27/2018 FINDINGS: Right Port-A-Cath remains in place, unchanged. Heart is normal size. No confluent airspace opacities or effusions. Numerous lucencies throughout the visualized skeletal  structures compatible with diffuse widespread myeloma. IMPRESSION: No acute cardiopulmonary disease. Electronically Signed   By: Rolm Baptise M.D.   On: 05/11/2018 22:57    Procedures Procedures (including critical care time)  Medications Ordered in ED Medications - No data to display   Initial Impression / Assessment and Plan / ED Course  I have reviewed the triage vital signs and the nursing notes.  Pertinent labs & imaging results that were available during my care of the patient were reviewed by me and considered in my medical decision making (see chart for details).     Patient states in the past clonazepam has helped with her sleep and anxiety.  Will give short supply until she can follow-up as an outpatient.  Currently no suicidal ideation. Final Clinical Impressions(s) / ED Diagnoses   Final diagnoses:  Anxiety  Insomnia, unspecified type    ED Discharge Orders         Ordered    clonazePAM (KLONOPIN) 0.5 MG tablet  At bedtime PRN     05/12/18 1240           Julianne Rice, MD 05/12/18 2115

## 2018-05-12 NOTE — ED Notes (Signed)
Pt states she feels nervous was informed by Dr. Walden Field to come to the Emergency Department to get prescription medications for her nerves. Pt was given the correct information on how [facility] work in the ER. Pt states she wants to just go home. Pt was suggested to at least stay and talk to the EDP before leaving to see if he could help.

## 2018-05-12 NOTE — Discharge Instructions (Signed)
Follow-up with both your primary physician appointment next week and with the behavioral health appointment.

## 2018-05-12 NOTE — ED Notes (Signed)
Pt given belongings.

## 2018-05-12 NOTE — ED Triage Notes (Signed)
Pt states she was called by the cancer center and asked to come in for a mental health evaluation.  Denies suicidal ideation but states as her chemo treatments have progressed she feels more depressed and nervous.  States she feels hopeless at times.

## 2018-05-12 NOTE — Progress Notes (Signed)
Received notification from call center triage nurse that patient was advised to go to the ER yesterday evening due to c/o depression/hopelessness and confusion.    ER note does not indicate that patient stated these things in her assessment.  She only complained of abdominal pain.    I attempted to call patient this morning to check on her and her phone does not have a voicemail box set up at this time.  I will try to call her again later today.

## 2018-05-12 NOTE — Progress Notes (Signed)
Patient returned call:  She states "I need help"  I asked if she was suicidal, she hesitated and states " I am depressed you know, I am just confused and don't know what to do" I asked again if she was suicidal or having thoughts of hurting herself and she states "no, not right now".  " I am depressed a lot you know, I am just hopeless"  "I have an appointment with behavioral health October 3rd. I am going to call and see if they can help me"  I asked if she has had treatment with them before and if she had history of behavioral health "yes but I quit taking my medication and didn't get it refilled"  I asked why she didn't get it refilled " because I started chemotherapy and I didn't want anything to mess that up.  I just want some help"   I explained to her that coming off her medication abruptly could make her symptoms of depression worse. I explained to her that she needs to take care of her whole body, not only the chemotherapy to take care of her cancer but she also needs her medications to take care of her mental health.    I asked her if she would come to the emergency room to be evaluated so that she didn't have to wait until October to be seen at behavioral health. She states" yes I will come because I need help, they aren't going to lock me up are they?"  I told her I wasn't sure what they would do but she would get evaluated and get the appropriate treatment.  She states " I will get dressed and come on to the ER then"   I called and talked with Magda Paganini, RN charge and gave report on patient.

## 2018-05-13 ENCOUNTER — Other Ambulatory Visit (HOSPITAL_COMMUNITY): Payer: Self-pay | Admitting: *Deleted

## 2018-05-13 ENCOUNTER — Telehealth (HOSPITAL_COMMUNITY): Payer: Self-pay

## 2018-05-13 DIAGNOSIS — C9000 Multiple myeloma not having achieved remission: Secondary | ICD-10-CM

## 2018-05-13 MED ORDER — LENALIDOMIDE 10 MG PO CAPS: 10.0000 mg | ORAL_CAPSULE | Freq: Every day | ORAL | 0 refills | Status: DC

## 2018-05-13 NOTE — Telephone Encounter (Signed)
Chart reviewed, revlimid refilled.

## 2018-05-13 NOTE — Telephone Encounter (Signed)
Called son back to let him know him mothers appointments for this week. Patient was confused and thought she has something scheduled for today. Son understood.

## 2018-05-14 ENCOUNTER — Other Ambulatory Visit (HOSPITAL_COMMUNITY): Payer: Self-pay | Admitting: *Deleted

## 2018-05-14 DIAGNOSIS — C9000 Multiple myeloma not having achieved remission: Secondary | ICD-10-CM

## 2018-05-14 LAB — URINE CULTURE

## 2018-05-14 MED ORDER — LENALIDOMIDE 10 MG PO CAPS: 10.0000 mg | ORAL_CAPSULE | Freq: Every day | ORAL | 0 refills | Status: DC

## 2018-05-15 ENCOUNTER — Encounter (HOSPITAL_COMMUNITY): Payer: Self-pay | Admitting: Hematology

## 2018-05-15 ENCOUNTER — Inpatient Hospital Stay (HOSPITAL_COMMUNITY): Payer: Medicaid Other

## 2018-05-15 ENCOUNTER — Inpatient Hospital Stay (HOSPITAL_BASED_OUTPATIENT_CLINIC_OR_DEPARTMENT_OTHER): Payer: Medicaid Other | Admitting: Hematology

## 2018-05-15 ENCOUNTER — Other Ambulatory Visit: Payer: Self-pay

## 2018-05-15 ENCOUNTER — Telehealth: Payer: Self-pay | Admitting: *Deleted

## 2018-05-15 VITALS — BP 107/60 | HR 104 | Temp 98.3°F | Resp 16 | Wt 159.4 lb

## 2018-05-15 DIAGNOSIS — C9000 Multiple myeloma not having achieved remission: Secondary | ICD-10-CM

## 2018-05-15 DIAGNOSIS — E860 Dehydration: Secondary | ICD-10-CM

## 2018-05-15 DIAGNOSIS — F419 Anxiety disorder, unspecified: Secondary | ICD-10-CM

## 2018-05-15 DIAGNOSIS — M7989 Other specified soft tissue disorders: Secondary | ICD-10-CM

## 2018-05-15 DIAGNOSIS — M8580 Other specified disorders of bone density and structure, unspecified site: Secondary | ICD-10-CM | POA: Diagnosis not present

## 2018-05-15 DIAGNOSIS — Z5112 Encounter for antineoplastic immunotherapy: Secondary | ICD-10-CM | POA: Diagnosis not present

## 2018-05-15 DIAGNOSIS — G4700 Insomnia, unspecified: Secondary | ICD-10-CM

## 2018-05-15 DIAGNOSIS — F4310 Post-traumatic stress disorder, unspecified: Secondary | ICD-10-CM

## 2018-05-15 DIAGNOSIS — R109 Unspecified abdominal pain: Secondary | ICD-10-CM

## 2018-05-15 DIAGNOSIS — M549 Dorsalgia, unspecified: Secondary | ICD-10-CM

## 2018-05-15 LAB — COMPREHENSIVE METABOLIC PANEL
ALK PHOS: 210 U/L — AB (ref 38–126)
ALT: 16 U/L (ref 0–44)
AST: 17 U/L (ref 15–41)
Albumin: 3.1 g/dL — ABNORMAL LOW (ref 3.5–5.0)
Anion gap: 12 (ref 5–15)
BILIRUBIN TOTAL: 0.4 mg/dL (ref 0.3–1.2)
BUN: 40 mg/dL — ABNORMAL HIGH (ref 8–23)
CALCIUM: 7.6 mg/dL — AB (ref 8.9–10.3)
CO2: 23 mmol/L (ref 22–32)
CREATININE: 2.3 mg/dL — AB (ref 0.44–1.00)
Chloride: 99 mmol/L (ref 98–111)
GFR calc non Af Amer: 21 mL/min — ABNORMAL LOW (ref >60)
GFR, EST AFRICAN AMERICAN: 24 mL/min — AB (ref >60)
GLUCOSE: 196 mg/dL — AB (ref 70–99)
Potassium: 5.3 mmol/L — ABNORMAL HIGH (ref 3.5–5.1)
SODIUM: 134 mmol/L — AB (ref 135–145)
TOTAL PROTEIN: 7.8 g/dL (ref 6.5–8.1)

## 2018-05-15 LAB — CBC WITH DIFFERENTIAL/PLATELET
Basophils Absolute: 0.1 10*3/uL (ref 0.0–0.1)
Basophils Relative: 1 %
EOS ABS: 0.2 10*3/uL (ref 0.0–0.7)
Eosinophils Relative: 3 %
HEMATOCRIT: 29.2 % — AB (ref 36.0–46.0)
HEMOGLOBIN: 9.4 g/dL — AB (ref 12.0–15.0)
LYMPHS ABS: 0.6 10*3/uL — AB (ref 0.7–4.0)
LYMPHS PCT: 10 %
MCH: 30.4 pg (ref 26.0–34.0)
MCHC: 32.2 g/dL (ref 30.0–36.0)
MCV: 94.5 fL (ref 78.0–100.0)
Monocytes Absolute: 0.5 10*3/uL (ref 0.1–1.0)
Monocytes Relative: 8 %
NEUTROS ABS: 4.4 10*3/uL (ref 1.7–7.7)
NEUTROS PCT: 78 %
Platelets: 254 10*3/uL (ref 150–400)
RBC: 3.09 MIL/uL — AB (ref 3.87–5.11)
RDW: 14.6 % (ref 11.5–15.5)
WBC: 5.7 10*3/uL (ref 4.0–10.5)

## 2018-05-15 MED ORDER — HYDROCODONE-ACETAMINOPHEN 5-325 MG PO TABS: 1.0000 | ORAL_TABLET | Freq: Two times a day (BID) | ORAL | 0 refills | Status: DC

## 2018-05-15 MED ORDER — SODIUM CHLORIDE 0.9 % IV SOLN
Freq: Once | INTRAVENOUS | Status: AC
  Administered 2018-05-15: 12:00:00 via INTRAVENOUS

## 2018-05-15 MED ORDER — DEXTROSE 5 % IV SOLN
33.0000 mg/m2 | Freq: Once | INTRAVENOUS | Status: AC
  Administered 2018-05-15: 60 mg via INTRAVENOUS
  Filled 2018-05-15: qty 30

## 2018-05-15 MED ORDER — LORAZEPAM 2 MG/ML IJ SOLN
INTRAMUSCULAR | Status: AC
  Filled 2018-05-15: qty 1

## 2018-05-15 MED ORDER — INFLUENZA VAC SPLIT HIGH-DOSE 0.5 ML IM SUSY
0.5000 mL | PREFILLED_SYRINGE | INTRAMUSCULAR | Status: AC
  Administered 2018-05-15: 0.5 mL via INTRAMUSCULAR
  Filled 2018-05-15: qty 0.5

## 2018-05-15 MED ORDER — SODIUM CHLORIDE 0.9% FLUSH: 10.0000 mL | INTRAVENOUS | Status: DC | PRN

## 2018-05-15 MED ORDER — ALPRAZOLAM 0.25 MG PO TABS: 0.2500 mg | ORAL_TABLET | Freq: Two times a day (BID) | ORAL | 0 refills | Status: DC | PRN

## 2018-05-15 MED ORDER — LORAZEPAM 2 MG/ML IJ SOLN
1.0000 mg | Freq: Once | INTRAMUSCULAR | Status: AC
  Administered 2018-05-15: 1 mg via INTRAVENOUS

## 2018-05-15 MED ORDER — HEPARIN SOD (PORK) LOCK FLUSH 100 UNIT/ML IV SOLN
500.0000 [IU] | Freq: Once | INTRAVENOUS | Status: DC | PRN
  Filled 2018-05-15: qty 5

## 2018-05-15 NOTE — Patient Instructions (Signed)
[facility] at [facility] Discharge Instructions  Follow up in 4 weeks with labs    Thank you for choosing [facility] at [facility] to provide your oncology and hematology care.  To afford each patient quality time with our provider, please arrive at least 15 minutes before your scheduled appointment time.   If you have a lab appointment with the Cancer Center please come in thru the  Main Entrance and check in at the main information desk  You need to re-schedule your appointment should you arrive 10 or more minutes late.  We strive to give you quality time with our providers, and arriving late affects you and other patients whose appointments are after yours.  Also, if you no show three or more times for appointments you may be dismissed from the clinic at the providers discretion.     Again, thank you for choosing [facility].  Our hope is that these requests will decrease the amount of time that you wait before being seen by our physicians.       _____________________________________________________________  Should you have questions after your visit to  Cancer Center, please contact our office at [phone] between the hours of 8:00 a.m. and 4:30 p.m.  Voicemails left after 4:00 p.m. will not be returned until the following business day.  For prescription refill requests, have your pharmacy contact our office and allow 72 hours.    Cancer Center Support Programs:   > Cancer Support Group  2nd Tuesday of the month 1pm-2pm, Journey Room    

## 2018-05-15 NOTE — Telephone Encounter (Signed)
Post ED Visit - Positive Culture Follow-up  Culture report reviewed by antimicrobial stewardship pharmacist:  []  Elenor Quinones, Pharm.D. []  Heide Guile, Pharm.D., BCPS AQ-ID []  Parks Neptune, Pharm.D., BCPS []  Alycia Rossetti, Pharm.D., BCPS []  [place], Pharm.D., BCPS, AAHIVP []  Legrand Como, Pharm.D., BCPS, AAHIVP [x]  Salome Arnt, PharmD, BCPS []  Johnnette Gourd, PharmD, BCPS []  Hughes Better, PharmD, BCPS []  Leeroy Cha, PharmD  Positive urine culture Negative dysuria, flank pain, afebrile and no further patient follow-up is required at this time.  Harlon Flor [facility] 05/15/2018, 11:08 AM
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
## 2018-05-15 NOTE — Progress Notes (Signed)
Tolerated infusion w/o adverse reaction.  Alert, in no distress.  VSS.  Discharged ambulatory in c/o friend.  

## 2018-05-15 NOTE — Assessment & Plan Note (Signed)
.  1.  IgG lambda plasma cell myeloma, stage II by R-ISS: - Presentation to [facility] with left-sided rib pain on 12/29/2017, found to have hypercalcemia, anemia and acute kidney injury, further work-up showed 3.2 g/dL of IgG lambda monoclonal gammopathy. -Skeletal survey reportedly demonstrated diffuse osteopenia and multiple lytic lesions in bilateral lower extremities. -Bone marrow biopsy on 12/31/2017 showed hypercellular marrow with 40 to 50% cellularity, involved by plasma cell neoplasm (17% plasma cells by manual aspirate differential count, greater than 90% and 30 to 40% by CD138 immunohistochemical analysis of clot and core respectively, and monotypic lambda by flow cytometry analysis) - Chromosome analysis showed 12 of 20 cells examined are normal, 46, X6.  The remaining 8 metaphase cells analyzed are abnormal and represent a hyperdiploid clone containing 1 extra copy each of chromosome 3, 5, 6, 7, 9, 11, 15, 19, 20, 21, 22 as well as a marker chromosome. -Multiple myeloma FISH panel shows nuclei positive for three 1q signals, trisomy 11, and extra signals of chromosome 7, 9 and 15. - Patient was recommended to start chemotherapy with port placement by Dr. Federico Flake at [facility].  She was recommended to have IV chemotherapy as she only has Medicare part A. Patient did not follow-up at [facility] - She does report on and off bilateral shoulder pains.  She can lie on the left shoulder.  She also reports on and off nausea for the last 3 to 4 weeks. -I have reviewed the results of blood work from 04/07/2018 which shows M spike of 4 g of IgG lambda monoclonal protein.  Lambda light chains were elevated at 59,667 with ratio equal to 0.  LDH was 115.  Beta-2 microglobulin was elevated at 12.8.  I have reviewed the images of the bone survey with the patient which showed large expansile lytic lesion in the left shoulder area and multiple lytic lesions in all the bones.  Patient was advised to not lift any  weights.  She does not have constant pain in the left shoulder.  We will consider radiation to the lytic lesion if there is uncontrollable pain. -KCyd started on 04/17/2018.  She has reported worsening of anxiety since the start of therapy.  There is also dehydration and dry heaves.  Back pain has gotten slightly worse. -She has received shipment for Revlimid 10 mg tablet for 14 pills.  She will start her cycle 2 today.  I have reviewed her blood work.  I will discontinue cyclophosphamide and start her on Revlimid.  2.  Difficulty falling and staying asleep: -She had sleeping problem for many years.  She is taking Tylenol PM which is not helping.  She has taken Xanax and Ambien in the past. -I have prescribed her Ambien 10 mg at nighttime which is helping.  3.  Hypercalcemia: - She had hypercalcemia at presentation.  She received Zometa 4 mg on 05/11/2018.  Her calcium normalized.   4.  Anxiety: - She complains of worsening anxiety since she started dexamethasone.  I will prescribe her Xanax 0.25 mg twice daily.  5.  Back pain: - She is taking hydrocodone 5 mg twice daily and tolerating it well.  Pain is well controlled.

## 2018-05-16 ENCOUNTER — Ambulatory Visit (HOSPITAL_COMMUNITY): Payer: Self-pay

## 2018-05-16 ENCOUNTER — Encounter (HOSPITAL_COMMUNITY): Payer: Self-pay

## 2018-05-16 ENCOUNTER — Inpatient Hospital Stay (HOSPITAL_COMMUNITY): Payer: Medicaid Other

## 2018-05-16 VITALS — BP 149/81 | HR 104 | Temp 98.0°F | Resp 18

## 2018-05-16 DIAGNOSIS — C9000 Multiple myeloma not having achieved remission: Secondary | ICD-10-CM

## 2018-05-16 DIAGNOSIS — Z5112 Encounter for antineoplastic immunotherapy: Secondary | ICD-10-CM | POA: Diagnosis not present

## 2018-05-16 MED ORDER — DEXTROSE 5 % IV SOLN
33.0000 mg/m2 | Freq: Once | INTRAVENOUS | Status: AC
  Administered 2018-05-16: 60 mg via INTRAVENOUS
  Filled 2018-05-16: qty 30

## 2018-05-16 MED ORDER — HEPARIN SOD (PORK) LOCK FLUSH 100 UNIT/ML IV SOLN
500.0000 [IU] | Freq: Once | INTRAVENOUS | Status: AC | PRN
  Administered 2018-05-16: 500 [IU]
  Filled 2018-05-16: qty 5

## 2018-05-16 MED ORDER — SODIUM CHLORIDE 0.9 % IV SOLN: Freq: Once | INTRAVENOUS | Status: DC

## 2018-05-16 MED ORDER — SODIUM CHLORIDE 0.9 % IV SOLN
Freq: Once | INTRAVENOUS | Status: AC
  Administered 2018-05-16: 14:00:00 via INTRAVENOUS

## 2018-05-16 NOTE — Progress Notes (Signed)
Nutrition Follow-up:  Patient with multiple myeloma.  Met with patient during infusion this pm.  Patient reports that her appetite is some better.  Eating chicken and sides during infusion.  Reports that she is feeling better today.  Reports that she is trying to drink 2 shakes per day with 2 yogurts daily.  Reports for dinner yesterday had roast beef sandwich (just ate the meat). Likes banana pops, peanut butter crackers.  Ate biscuit and gravy this am.     Medications: reviewed  Labs: K 5.3, BUN 40, creatinine 2.30  Anthropometrics:   Weight decreased slightly to 159 lb 6.4 oz from 162 lb on 9/13.     NUTRITION DIAGNOSIS: Inadequate oral intake continues   MALNUTRITION DIAGNOSIS: none at this time   INTERVENTION:  Encouraged patient to continue with oral nutrition supplements. Discussed potassium content in supplement drinks. Provided list of foods high in potassium and low in potassium as patient reports MD encouraged her to avoid foods high in potassium.  Handout from AND given to patient.  Encouraged high calories, high protein foods    MONITORING, EVALUATION, GOAL: Patient will consume adequate calories and protein to prevent further weight loss.   NEXT VISIT: October 25 during infusion  Alexandra Price, Alexandra Price, Alexandra Price Registered Dietitian [phone] (pager)

## 2018-05-16 NOTE — Progress Notes (Signed)
Tolerated infusion w/o adverse reaction.  Alert, in no distress.  VSS.  Discharged ambulatory in c/o family.  

## 2018-05-19 ENCOUNTER — Encounter (HOSPITAL_COMMUNITY): Payer: Self-pay

## 2018-05-19 ENCOUNTER — Inpatient Hospital Stay (HOSPITAL_COMMUNITY): Payer: Medicaid Other

## 2018-05-19 VITALS — BP 132/69 | HR 100 | Temp 98.0°F | Resp 16

## 2018-05-19 DIAGNOSIS — C9000 Multiple myeloma not having achieved remission: Secondary | ICD-10-CM

## 2018-05-19 DIAGNOSIS — R42 Dizziness and giddiness: Secondary | ICD-10-CM

## 2018-05-19 DIAGNOSIS — K5900 Constipation, unspecified: Secondary | ICD-10-CM

## 2018-05-19 DIAGNOSIS — Z5112 Encounter for antineoplastic immunotherapy: Secondary | ICD-10-CM | POA: Diagnosis not present

## 2018-05-19 MED ORDER — HEPARIN SOD (PORK) LOCK FLUSH 100 UNIT/ML IV SOLN
500.0000 [IU] | Freq: Once | INTRAVENOUS | Status: AC
  Administered 2018-05-19: 500 [IU] via INTRAVENOUS

## 2018-05-19 MED ORDER — SODIUM CHLORIDE 0.9% FLUSH
10.0000 mL | Freq: Once | INTRAVENOUS | Status: AC
  Administered 2018-05-19: 10 mL via INTRAVENOUS

## 2018-05-19 MED ORDER — LACTULOSE 10 GM/15ML PO SOLN: ORAL | 1 refills | Status: DC

## 2018-05-19 MED ORDER — SODIUM CHLORIDE 0.9 % IV SOLN
INTRAVENOUS | Status: DC
  Administered 2018-05-19: 11:00:00 via INTRAVENOUS

## 2018-05-19 NOTE — Progress Notes (Deleted)
Psychiatric Initial Adult Assessment   Patient Identification: Alexandra Price MRN:  [id number] Date of Evaluation:  05/19/2018 Referral Source: *** Chief Complaint:   Visit Diagnosis: No diagnosis found.  History of Present Illness:   Alexandra Price is a 72 y.o. year old female with a history of anxiety, multiple myeloma, hypercalcemia, osteopenia, HTN, prediabetes, who is referred for anxiety.     Associated Signs/Symptoms: Depression Symptoms:  {DEPRESSION SYMPTOMS:20000} (Hypo) Manic Symptoms:  {BHH MANIC SYMPTOMS:22872} Anxiety Symptoms:  {BHH ANXIETY SYMPTOMS:22873} Psychotic Symptoms:  {BHH PSYCHOTIC SYMPTOMS:22874} PTSD Symptoms: {BHH PTSD SYMPTOMS:22875}  Past Psychiatric History:  Outpatient:  Psychiatry admission:  Previous suicide attempt:  Past trials of medication:  History of violence:   Previous Psychotropic Medications: {YES/NO:21197}  Substance Abuse History in the last 12 months:  {yes no:314532}  Consequences of Substance Abuse: {BHH CONSEQUENCES OF SUBSTANCE ABUSE:22880}  Past Medical History:  Past Medical History:  Diagnosis Date  . Anxiety   . Depression   . Multiple myeloma ([place])    multiple myeloma    Past Surgical History:  Procedure Laterality Date  . ABDOMINAL HYSTERECTOMY     total  . PORTACATH PLACEMENT Right 04/14/2018   Procedure: INSERTION PORT-A-CATH;  Surgeon: [name], MD;  Location: [facility];  Service: General;  Laterality: Right;    Family Psychiatric History: ***  Family History:  Family History  Problem Relation Age of Onset  . Heart disease Mother   . Emphysema Father   . Diabetes Sister   . Cancer Brother        liver, lung, and colon  . Diabetes Brother     Social History:   Social History   Socioeconomic History  . Marital status: Legally Separated    Spouse name: Not on file  . Number of children: 6  . Years of education: Not on file  . Highest education level: Not on file  Occupational History     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  Social Needs  . Financial resource strain: Hard  . Food insecurity:    Worry: Sometimes true    Inability: Sometimes true  . Transportation needs:    Medical: No    Non-medical: No  Tobacco Use  . Smoking status: Never Smoker  . Smokeless tobacco: Never Used  Substance and Sexual Activity  . Alcohol use: Never    Frequency: Never  . Drug use: Not Currently  . Sexual activity: Not Currently  Lifestyle  . Physical activity:    Days per week: 0 days    Minutes per session: 0 min  . Stress: Very much  Relationships  . Social connections:    Talks on phone: More than three times a week    Gets together: Once a week    Attends religious service: More than 4 times per year    Active member of club or organization: No    Attends meetings of clubs or organizations: Never    Relationship status: Separated  Other Topics Concern  . Not on file  Social History Narrative  . Not on file    Additional Social History: ***  Allergies:   Allergies  Allergen Reactions  . Morphine And Related Nausea And Vomiting    Metabolic Disorder Labs: No results found for: HGBA1C, MPG No results found for: PROLACTIN No results found for: CHOL, TRIG, HDL, CHOLHDL, VLDL, LDLCALC   Current Medications: Current Outpatient Medications  Medication Sig Dispense Refill  . acyclovir (ZOVIRAX) 400 MG tablet Take 1 tablet (400  mg total) by mouth daily. 30 tablet 3  . allopurinol (ZYLOPRIM) 300 MG tablet Take 1 tablet (300 mg total) by mouth daily. 30 tablet 1  . ALPRAZolam (XANAX) 0.25 MG tablet Take 1 tablet (0.25 mg total) by mouth 2 (two) times daily as needed for anxiety. 60 tablet 0  . ASPIRIN 81 PO Take 81 mg by mouth daily.    . Calcium Carb-Cholecalciferol (CALCIUM 1000 + D PO) Take 1,000 mg by mouth daily. Take one tablet daily until next visit with oncologist.    . Carfilzomib (KYPROLIS IV) Inject into the vein.    . clonazePAM (KLONOPIN) 0.5 MG tablet  Take 1 tablet (0.5 mg total) by mouth at bedtime as needed for anxiety (insomnia). 7 tablet 0  . dexamethasone (DECADRON) 4 MG tablet Take 5 tablets (20 mg) on days 1, 2, 8, 9, 15 and 16 on the days of chemotherapy.  Take 10 tablets during the week of no chemotherapy. 40 tablet 4  . HYDROcodone-acetaminophen (NORCO) 5-325 MG tablet Take 1 tablet by mouth 2 (two) times daily. 60 tablet 0  . lactulose (CHRONULAC) 10 GM/15ML solution Take 15 to 30 ml at bedtime every night to assist with moving bowels.  Titrate down if having multiple bowel movements.  If bowels hav not moved in 3 to 4 days or longer-may give 15 to 30 ml every 2 hours until bowlel movement then resume taking 15 to 30 ml every night. 240 mL 0  . lenalidomide (REVLIMID) 10 MG capsule Take 1 capsule (10 mg total) by mouth daily. (Patient not taking: Reported on 05/15/2018) 14 capsule 0  . lidocaine-prilocaine (EMLA) cream Apply to affected area once 30 g 3  . lisinopril (PRINIVIL,ZESTRIL) 10 MG tablet Take 1 tablet (10 mg total) by mouth daily. 30 tablet 1  . ondansetron (ZOFRAN) 4 MG tablet Take 1 tablet (4 mg total) by mouth every 8 (eight) hours as needed for nausea or vomiting. 20 tablet 1  . pantoprazole (PROTONIX) 40 MG tablet Take 1 tablet (40 mg total) by mouth daily. 30 tablet 0  . prochlorperazine (COMPAZINE) 10 MG tablet Take 1 tablet (10 mg total) by mouth every 6 (six) hours as needed (Nausea or vomiting). 30 tablet 1  . promethazine (PHENERGAN) 25 MG tablet Take 1 tablet (25 mg total) by mouth every 6 (six) hours as needed. 15 tablet 0  . temazepam (RESTORIL) 15 MG capsule Take 1 capsule (15 mg total) by mouth at bedtime as needed for sleep. 30 capsule 0  . traMADol (ULTRAM) 50 MG tablet Take 1 tablet (50 mg total) by mouth every 6 (six) hours as needed. 60 tablet 0  . trolamine salicylate (ASPERCREME) 10 % cream Apply 1 application topically daily as needed for muscle pain.     No current facility-administered medications for  this visit.    Facility-Administered Medications Ordered in Other Visits  Medication Dose Route Frequency Provider Last Rate Last Dose  . 0.9 %  sodium chloride infusion   Intravenous Continuous [name], MD      . heparin lock flush 100 unit/mL  500 Units Intravenous Once [name], MD      . sodium chloride flush (NS) 0.9 % injection 10 mL  10 mL Intravenous Once [name], MD        Neurologic: Headache: No Seizure: No Paresthesias:No  Musculoskeletal: Strength & Muscle Tone: within normal limits Gait & Station: normal Patient leans: N/A  Psychiatric Specialty Exam: ROS  There were no vitals  taken for this visit.There is no height or weight on file to calculate BMI.  General Appearance: Fairly Groomed  Eye Contact:  Good  Speech:  Clear and Coherent  Volume:  Normal  Mood:  {BHH MOOD:22306}  Affect:  {Affect (PAA):22687}  Thought Process:  Coherent  Orientation:  Full (Time, Place, and Person)  Thought Content:  Logical  Suicidal Thoughts:  {ST/HT (PAA):22692}  Homicidal Thoughts:  {ST/HT (PAA):22692}  Memory:  Immediate;   Good  Judgement:  {Judgement (PAA):22694}  Insight:  {Insight (PAA):22695}  Psychomotor Activity:  Normal  Concentration:  Concentration: Good and Attention Span: Good  Recall:  Good  Fund of Knowledge:Good  Language: Good  Akathisia:  No  Handed:  Right  AIMS (if indicated):  N/A  Assets:  Communication Skills Desire for Improvement  ADL's:  Intact  Cognition: WNL  Sleep:  ***   Assessment  Plan  The patient demonstrates the following risk factors for suicide: Chronic risk factors for suicide include: {Chronic Risk Factors for YGEFUWT:21828833}. Acute risk factors for suicide include: {Acute Risk Factors for VOUZHQU:04799872}. Protective factors for this patient include: {Protective Factors for Suicide JLUN:27618485}. Considering these factors, the overall suicide risk at this point appears to be {Desc;  low/moderate/high:110033}. Patient {ACTION; IS/IS TCN:63943200} appropriate for outpatient follow up.   Treatment Plan Summary: Plan as above   Norman Clay, MD 9/30/201911:00 AM

## 2018-05-19 NOTE — Progress Notes (Signed)
Patient called this morning stating she feels she needs hydration.  Patient stated she has been eating and drinking with prn dry heaves.  Her urine was a light yellow with no odors.  Taking nausea medications as directed but also feels "lightheaded" per patient.  Patient also complaints of dry mouth.  Reviewed symptoms with Dr. Delton Coombes with orders to hydrate with NACL over 2 hours.   1100-patient to treatment area for hydration.  Reviewed medications for anxiety and instructed the patient not to take the clonazepam with her xanax.  Patient stated she is only taking the xanax but taking every "7 hours".  Reviewed medication list and directions for the xanax with understanding verbalized.    Patient stated she has not had a bowel movement in 3 days. Patient has a prescription for Lactulose. Reviewed the directions of lactulose with the patient and the patient stated she has only been taking 40ml at night and taking 84ml every two hours on Sunday for constipation.  Reviewed the directions of Lactulose and stool softeners with the patient and refill given for Lactulose.  Reviewed constipation symptoms with Dr. Delton Coombes with orders to reinforce the lactulose directions.  Reviewed with the patient and son with understanding verbalized.    Patient tolerated hydration with no complaints voiced.  Reinforced Lactulose and stool softeners with patient and family with understanding verbalized.  Port site clean and dry with no bruising or swelling noted at site.  Patient stated she felt better.  Family at side.  Patient discharge by wheelchair with no s/s of distress noted.

## 2018-05-19 NOTE — Patient Instructions (Signed)
Alexandra Price at [facility]  Discharge Instructions:  You received hydration today.  Follow the directions for lactulose and call tomorrow to let us know how you are doing.  _______________________________________________________________  Thank you for choosing [place] at [facility] to provide your oncology and hematology care.  To afford each patient quality time with our providers, please arrive at least 15 minutes before your scheduled appointment.  You need to re-schedule your appointment if you arrive 10 or more minutes late.  We strive to give you quality time with our providers, and arriving late affects you and other patients whose appointments are after yours.  Also, if you no show three or more times for appointments you may be dismissed from the clinic.  Again, thank you for choosing [place] at [place] hope is that these requests will allow you access to exceptional care and in a timely manner. _______________________________________________________________  If you have questions after your visit, please contact our office at [phone] between the hours of 8:30 a.m. and 5:00 p.m. Voicemails left after 4:30 p.m. will not be returned until the following business day. _______________________________________________________________  For prescription refill requests, have your pharmacy contact our office. _______________________________________________________________  Recommendations made by the consultant and any test results will be sent to your referring physician. _______________________________________________________________

## 2018-05-20 ENCOUNTER — Other Ambulatory Visit: Payer: Self-pay

## 2018-05-20 ENCOUNTER — Encounter (HOSPITAL_COMMUNITY): Payer: Self-pay | Admitting: Emergency Medicine

## 2018-05-20 ENCOUNTER — Emergency Department (HOSPITAL_COMMUNITY): Payer: Medicaid Other | Admitting: Anesthesiology

## 2018-05-20 ENCOUNTER — Encounter (HOSPITAL_COMMUNITY): Payer: Self-pay | Admitting: General Practice

## 2018-05-20 ENCOUNTER — Emergency Department (HOSPITAL_COMMUNITY): Payer: Medicaid Other

## 2018-05-20 ENCOUNTER — Telehealth (HOSPITAL_COMMUNITY): Payer: Self-pay

## 2018-05-20 ENCOUNTER — Observation Stay (HOSPITAL_COMMUNITY)
Admission: EM | Admit: 2018-05-20 | Discharge: 2018-05-21 | Disposition: A | Discharge status: Home / Self Care | Payer: Medicaid Other | Attending: General Surgery | Admitting: General Surgery

## 2018-05-20 ENCOUNTER — Encounter (HOSPITAL_COMMUNITY)
Admission: EM | Disposition: A | Discharge status: Home / Self Care | Payer: Self-pay | Source: Home / Self Care | Attending: Emergency Medicine

## 2018-05-20 DIAGNOSIS — F419 Anxiety disorder, unspecified: Secondary | ICD-10-CM | POA: Diagnosis not present

## 2018-05-20 DIAGNOSIS — Z79899 Other long term (current) drug therapy: Secondary | ICD-10-CM | POA: Diagnosis not present

## 2018-05-20 DIAGNOSIS — C9000 Multiple myeloma not having achieved remission: Secondary | ICD-10-CM | POA: Insufficient documentation

## 2018-05-20 DIAGNOSIS — Z8249 Family history of ischemic heart disease and other diseases of the circulatory system: Secondary | ICD-10-CM | POA: Diagnosis not present

## 2018-05-20 DIAGNOSIS — Z885 Allergy status to narcotic agent status: Secondary | ICD-10-CM | POA: Diagnosis not present

## 2018-05-20 DIAGNOSIS — K3589 Other acute appendicitis without perforation or gangrene: Secondary | ICD-10-CM | POA: Diagnosis not present

## 2018-05-20 DIAGNOSIS — K3580 Unspecified acute appendicitis: Secondary | ICD-10-CM | POA: Diagnosis present

## 2018-05-20 DIAGNOSIS — Z7982 Long term (current) use of aspirin: Secondary | ICD-10-CM | POA: Insufficient documentation

## 2018-05-20 DIAGNOSIS — Z9049 Acquired absence of other specified parts of digestive tract: Secondary | ICD-10-CM

## 2018-05-20 DIAGNOSIS — F329 Major depressive disorder, single episode, unspecified: Secondary | ICD-10-CM | POA: Diagnosis not present

## 2018-05-20 HISTORY — PX: LAPAROSCOPIC APPENDECTOMY: SHX408

## 2018-05-20 LAB — URINALYSIS, ROUTINE W REFLEX MICROSCOPIC
Bilirubin Urine: NEGATIVE
GLUCOSE, UA: 50 mg/dL — AB
Ketones, ur: NEGATIVE mg/dL
NITRITE: NEGATIVE
PROTEIN: 30 mg/dL — AB
Specific Gravity, Urine: 1.011 (ref 1.005–1.030)
pH: 7 (ref 5.0–8.0)

## 2018-05-20 LAB — COMPREHENSIVE METABOLIC PANEL
ALBUMIN: 3 g/dL — AB (ref 3.5–5.0)
ALT: 21 U/L (ref 0–44)
ANION GAP: 10 (ref 5–15)
AST: 18 U/L (ref 15–41)
Alkaline Phosphatase: 150 U/L — ABNORMAL HIGH (ref 38–126)
BUN: 25 mg/dL — AB (ref 8–23)
CO2: 24 mmol/L (ref 22–32)
Calcium: 7.6 mg/dL — ABNORMAL LOW (ref 8.9–10.3)
Chloride: 96 mmol/L — ABNORMAL LOW (ref 98–111)
Creatinine, Ser: 2.13 mg/dL — ABNORMAL HIGH (ref 0.44–1.00)
GFR calc Af Amer: 26 mL/min — ABNORMAL LOW (ref >60)
GFR, EST NON AFRICAN AMERICAN: 22 mL/min — AB (ref >60)
Glucose, Bld: 158 mg/dL — ABNORMAL HIGH (ref 70–99)
POTASSIUM: 4.5 mmol/L (ref 3.5–5.1)
Sodium: 130 mmol/L — ABNORMAL LOW (ref 135–145)
TOTAL PROTEIN: 7.8 g/dL (ref 6.5–8.1)
Total Bilirubin: 0.5 mg/dL (ref 0.3–1.2)

## 2018-05-20 LAB — I-STAT CG4 LACTIC ACID, ED: LACTIC ACID, VENOUS: 1.86 mmol/L (ref 0.5–1.9)

## 2018-05-20 SURGERY — APPENDECTOMY, LAPAROSCOPIC
Anesthesia: General | Site: Abdomen

## 2018-05-20 MED ORDER — SODIUM CHLORIDE 0.9 % IV SOLN
INTRAVENOUS | Status: DC
  Administered 2018-05-20: 18:00:00 via INTRAVENOUS

## 2018-05-20 MED ORDER — METHYLPREDNISOLONE SODIUM SUCC 125 MG IJ SOLR
INTRAMUSCULAR | Status: DC | PRN
  Administered 2018-05-20: 125 mg via INTRAVENOUS

## 2018-05-20 MED ORDER — HYDROMORPHONE HCL 1 MG/ML IJ SOLN
0.2500 mg | INTRAMUSCULAR | Status: DC | PRN
  Administered 2018-05-20: 0.5 mg via INTRAVENOUS
  Filled 2018-05-20: qty 0.5

## 2018-05-20 MED ORDER — ONDANSETRON HCL 4 MG/2ML IJ SOLN
INTRAMUSCULAR | Status: DC | PRN
  Administered 2018-05-20: 4 mg via INTRAVENOUS

## 2018-05-20 MED ORDER — PANTOPRAZOLE SODIUM 40 MG PO TBEC
40.0000 mg | DELAYED_RELEASE_TABLET | Freq: Every day | ORAL | Status: DC
  Administered 2018-05-20 – 2018-05-21 (×2): 40 mg via ORAL
  Filled 2018-05-20 (×2): qty 1

## 2018-05-20 MED ORDER — ACETAMINOPHEN 500 MG PO TABS
1000.0000 mg | ORAL_TABLET | Freq: Once | ORAL | Status: AC
  Administered 2018-05-20: 1000 mg via ORAL
  Filled 2018-05-20: qty 2

## 2018-05-20 MED ORDER — FENTANYL CITRATE (PF) 100 MCG/2ML IJ SOLN
50.0000 ug | INTRAMUSCULAR | Status: DC | PRN
  Administered 2018-05-20: 50 ug via INTRAVENOUS
  Filled 2018-05-20: qty 2

## 2018-05-20 MED ORDER — PIPERACILLIN-TAZOBACTAM 3.375 G IVPB 30 MIN
3.3750 g | Freq: Once | INTRAVENOUS | Status: AC
  Administered 2018-05-20: 3.375 g via INTRAVENOUS
  Filled 2018-05-20: qty 50

## 2018-05-20 MED ORDER — MEPERIDINE HCL 50 MG/ML IJ SOLN: 6.2500 mg | INTRAMUSCULAR | Status: DC | PRN

## 2018-05-20 MED ORDER — SODIUM CHLORIDE 0.9 % IR SOLN
Status: DC | PRN
  Administered 2018-05-20: 1000 mL

## 2018-05-20 MED ORDER — EPHEDRINE SULFATE 50 MG/ML IJ SOLN
INTRAMUSCULAR | Status: DC | PRN
  Administered 2018-05-20: 10 mg via INTRAVENOUS

## 2018-05-20 MED ORDER — POVIDONE-IODINE 10 % OINT PACKET
TOPICAL_OINTMENT | CUTANEOUS | Status: DC | PRN
  Administered 2018-05-20: 1 via TOPICAL

## 2018-05-20 MED ORDER — LIDOCAINE HCL (PF) 0.5 % IJ SOLN
INTRAMUSCULAR | Status: AC
  Filled 2018-05-20: qty 50

## 2018-05-20 MED ORDER — SODIUM CHLORIDE 0.9 % IV BOLUS
1000.0000 mL | Freq: Once | INTRAVENOUS | Status: AC
  Administered 2018-05-20: 1000 mL via INTRAVENOUS

## 2018-05-20 MED ORDER — PIPERACILLIN-TAZOBACTAM 3.375 G IVPB: 3.3750 g | Freq: Three times a day (TID) | INTRAVENOUS | Status: DC

## 2018-05-20 MED ORDER — ONDANSETRON 4 MG PO TBDP: 4.0000 mg | ORAL_TABLET | Freq: Four times a day (QID) | ORAL | Status: DC | PRN

## 2018-05-20 MED ORDER — ROCURONIUM BROMIDE 100 MG/10ML IV SOLN
INTRAVENOUS | Status: DC | PRN
  Administered 2018-05-20: 30 mg via INTRAVENOUS

## 2018-05-20 MED ORDER — ARTIFICIAL TEARS OPHTHALMIC OINT
TOPICAL_OINTMENT | OPHTHALMIC | Status: AC
  Filled 2018-05-20: qty 3.5

## 2018-05-20 MED ORDER — PROPOFOL 10 MG/ML IV BOLUS
INTRAVENOUS | Status: DC | PRN
  Administered 2018-05-20: 110 mg via INTRAVENOUS

## 2018-05-20 MED ORDER — PROPOFOL 10 MG/ML IV BOLUS
INTRAVENOUS | Status: AC
  Filled 2018-05-20: qty 40

## 2018-05-20 MED ORDER — IOPAMIDOL (ISOVUE-300) INJECTION 61%
INTRAVENOUS | Status: AC
  Filled 2018-05-20: qty 300

## 2018-05-20 MED ORDER — SUCCINYLCHOLINE CHLORIDE 20 MG/ML IJ SOLN
INTRAMUSCULAR | Status: DC | PRN
  Administered 2018-05-20: 110 mg via INTRAVENOUS

## 2018-05-20 MED ORDER — FENTANYL CITRATE (PF) 100 MCG/2ML IJ SOLN
50.0000 ug | INTRAMUSCULAR | Status: DC | PRN
  Administered 2018-05-20 – 2018-05-21 (×4): 50 ug via INTRAVENOUS
  Filled 2018-05-20 (×4): qty 2

## 2018-05-20 MED ORDER — ROCURONIUM BROMIDE 50 MG/5ML IV SOLN
INTRAVENOUS | Status: AC
  Filled 2018-05-20: qty 1

## 2018-05-20 MED ORDER — LENALIDOMIDE 10 MG PO CAPS: 10.0000 mg | ORAL_CAPSULE | Freq: Every day | ORAL | Status: DC

## 2018-05-20 MED ORDER — ALPRAZOLAM 0.25 MG PO TABS: 0.2500 mg | ORAL_TABLET | Freq: Two times a day (BID) | ORAL | Status: DC | PRN

## 2018-05-20 MED ORDER — METHYLPREDNISOLONE SODIUM SUCC 125 MG IJ SOLR
INTRAMUSCULAR | Status: AC
  Filled 2018-05-20: qty 2

## 2018-05-20 MED ORDER — SUCCINYLCHOLINE CHLORIDE 20 MG/ML IJ SOLN
INTRAMUSCULAR | Status: AC
  Filled 2018-05-20: qty 1

## 2018-05-20 MED ORDER — TEMAZEPAM 15 MG PO CAPS: 15.0000 mg | ORAL_CAPSULE | Freq: Every evening | ORAL | Status: DC | PRN

## 2018-05-20 MED ORDER — CHLORHEXIDINE GLUCONATE CLOTH 2 % EX PADS: 6.0000 | MEDICATED_PAD | Freq: Once | CUTANEOUS | Status: DC

## 2018-05-20 MED ORDER — ONDANSETRON HCL 4 MG/2ML IJ SOLN
4.0000 mg | Freq: Four times a day (QID) | INTRAMUSCULAR | Status: DC | PRN
  Administered 2018-05-20: 4 mg via INTRAVENOUS
  Filled 2018-05-20: qty 2

## 2018-05-20 MED ORDER — ALLOPURINOL 300 MG PO TABS
300.0000 mg | ORAL_TABLET | Freq: Every day | ORAL | Status: DC
  Administered 2018-05-20 – 2018-05-21 (×2): 300 mg via ORAL
  Filled 2018-05-20 (×2): qty 1

## 2018-05-20 MED ORDER — FENTANYL CITRATE (PF) 250 MCG/5ML IJ SOLN
INTRAMUSCULAR | Status: AC
  Filled 2018-05-20: qty 5

## 2018-05-20 MED ORDER — LIDOCAINE HCL (PF) 1 % IJ SOLN
INTRAMUSCULAR | Status: AC
  Filled 2018-05-20: qty 5

## 2018-05-20 MED ORDER — LISINOPRIL 10 MG PO TABS
10.0000 mg | ORAL_TABLET | Freq: Every day | ORAL | Status: DC
  Administered 2018-05-20 – 2018-05-21 (×2): 10 mg via ORAL
  Filled 2018-05-20 (×2): qty 1

## 2018-05-20 MED ORDER — PIPERACILLIN-TAZOBACTAM 3.375 G IVPB
3.3750 g | Freq: Three times a day (TID) | INTRAVENOUS | Status: DC
  Administered 2018-05-20 – 2018-05-21 (×2): 3.375 g via INTRAVENOUS
  Filled 2018-05-20 (×2): qty 50

## 2018-05-20 MED ORDER — PHENYLEPHRINE HCL 10 MG/ML IJ SOLN
INTRAMUSCULAR | Status: DC | PRN
  Administered 2018-05-20: 80 ug via INTRAVENOUS
  Administered 2018-05-20: 40 ug via INTRAVENOUS

## 2018-05-20 MED ORDER — BUPIVACAINE LIPOSOME 1.3 % IJ SUSP
INTRAMUSCULAR | Status: DC | PRN
  Administered 2018-05-20: 20 mL

## 2018-05-20 MED ORDER — HEMOSTATIC AGENTS (NO CHARGE) OPTIME
TOPICAL | Status: DC | PRN
  Administered 2018-05-20: 1 via TOPICAL

## 2018-05-20 MED ORDER — SIMETHICONE 80 MG PO CHEW: 40.0000 mg | CHEWABLE_TABLET | Freq: Four times a day (QID) | ORAL | Status: DC | PRN

## 2018-05-20 MED ORDER — EPHEDRINE SULFATE 50 MG/ML IJ SOLN
INTRAMUSCULAR | Status: AC
  Filled 2018-05-20: qty 1

## 2018-05-20 MED ORDER — ONDANSETRON HCL 4 MG/2ML IJ SOLN
INTRAMUSCULAR | Status: AC
  Filled 2018-05-20: qty 2

## 2018-05-20 MED ORDER — ACYCLOVIR 800 MG PO TABS
400.0000 mg | ORAL_TABLET | Freq: Every day | ORAL | Status: DC
  Administered 2018-05-20 – 2018-05-21 (×2): 400 mg via ORAL
  Filled 2018-05-20 (×2): qty 1

## 2018-05-20 MED ORDER — LACTATED RINGERS IV SOLN
INTRAVENOUS | Status: DC
  Administered 2018-05-20: 15:00:00 via INTRAVENOUS

## 2018-05-20 MED ORDER — MIDAZOLAM HCL 2 MG/2ML IJ SOLN
INTRAMUSCULAR | Status: AC
  Filled 2018-05-20: qty 2

## 2018-05-20 MED ORDER — LACTATED RINGERS IV SOLN: INTRAVENOUS | Status: DC

## 2018-05-20 MED ORDER — ENOXAPARIN SODIUM 40 MG/0.4ML ~~LOC~~ SOLN
40.0000 mg | SUBCUTANEOUS | Status: DC
  Administered 2018-05-21: 40 mg via SUBCUTANEOUS
  Filled 2018-05-20: qty 0.4

## 2018-05-20 MED ORDER — SODIUM CHLORIDE 0.9 % IJ SOLN
INTRAMUSCULAR | Status: AC
  Filled 2018-05-20: qty 10

## 2018-05-20 MED ORDER — HYDROCODONE-ACETAMINOPHEN 7.5-325 MG PO TABS: 1.0000 | ORAL_TABLET | Freq: Once | ORAL | Status: DC | PRN

## 2018-05-20 MED ORDER — SUGAMMADEX SODIUM 500 MG/5ML IV SOLN
INTRAVENOUS | Status: DC | PRN
  Administered 2018-05-20: 144.6 mg via INTRAVENOUS

## 2018-05-20 MED ORDER — TRAMADOL HCL 50 MG PO TABS
50.0000 mg | ORAL_TABLET | Freq: Four times a day (QID) | ORAL | Status: DC | PRN
  Administered 2018-05-21: 50 mg via ORAL
  Filled 2018-05-20: qty 1

## 2018-05-20 MED ORDER — FENTANYL CITRATE (PF) 100 MCG/2ML IJ SOLN
INTRAMUSCULAR | Status: DC | PRN
  Administered 2018-05-20 (×4): 50 ug via INTRAVENOUS

## 2018-05-20 MED ORDER — ENSURE ENLIVE PO LIQD: 237.0000 mL | Freq: Two times a day (BID) | ORAL | Status: DC

## 2018-05-20 MED ORDER — PROMETHAZINE HCL 25 MG/ML IJ SOLN: 6.2500 mg | INTRAMUSCULAR | Status: DC | PRN

## 2018-05-20 MED ORDER — SUGAMMADEX SODIUM 200 MG/2ML IV SOLN
INTRAVENOUS | Status: AC
  Filled 2018-05-20: qty 2

## 2018-05-20 SURGICAL SUPPLY — 51 items
APL SRG 38 LTWT LNG FL B (MISCELLANEOUS) ×1
APPLICATOR ARISTA FLEXITIP XL (MISCELLANEOUS) ×1 IMPLANT
BAG RETRIEVAL 10 (BASKET) ×1
CHLORAPREP W/TINT 26ML (MISCELLANEOUS) ×2 IMPLANT
CLOTH BEACON ORANGE TIMEOUT ST (SAFETY) ×2 IMPLANT
COVER LIGHT HANDLE STERIS (MISCELLANEOUS) ×4 IMPLANT
CUTTER FLEX LINEAR 45M (STAPLE) ×2 IMPLANT
DECANTER SPIKE VIAL GLASS SM (MISCELLANEOUS) ×2 IMPLANT
ELECT REM PT RETURN 9FT ADLT (ELECTROSURGICAL) ×2
ELECTRODE REM PT RTRN 9FT ADLT (ELECTROSURGICAL) ×1 IMPLANT
EVACUATOR SMOKE 8.L (FILTER) ×2 IMPLANT
GLOVE BIOGEL PI IND STRL 7.0 (GLOVE) ×2 IMPLANT
GLOVE BIOGEL PI INDICATOR 7.0 (GLOVE) ×2
GLOVE SURG SS PI 7.5 STRL IVOR (GLOVE) ×2 IMPLANT
GOWN STRL REUS W/ TWL XL LVL3 (GOWN DISPOSABLE) ×1 IMPLANT
GOWN STRL REUS W/TWL LRG LVL3 (GOWN DISPOSABLE) ×2 IMPLANT
GOWN STRL REUS W/TWL XL LVL3 (GOWN DISPOSABLE) ×2
HEMOSTAT ARISTA ABSORB 1G (MISCELLANEOUS) ×2 IMPLANT
INST SET LAPROSCOPIC AP (KITS) ×2 IMPLANT
IV NS IRRIG 3000ML ARTHROMATIC (IV SOLUTION) IMPLANT
KIT TURNOVER KIT A (KITS) ×2 IMPLANT
MANIFOLD NEPTUNE II (INSTRUMENTS) ×2 IMPLANT
NDL HYPO 18GX1.5 BLUNT FILL (NEEDLE) ×1 IMPLANT
NEEDLE HYPO 18GX1.5 BLUNT FILL (NEEDLE) ×2 IMPLANT
NEEDLE HYPO 22GX1.5 SAFETY (NEEDLE) ×2 IMPLANT
NEEDLE INSUFFLATION 14GA 120MM (NEEDLE) ×2 IMPLANT
NS IRRIG 1000ML POUR BTL (IV SOLUTION) ×2 IMPLANT
PACK LAP CHOLE LZT030E (CUSTOM PROCEDURE TRAY) ×2 IMPLANT
PAD ARMBOARD 7.5X6 YLW CONV (MISCELLANEOUS) ×2 IMPLANT
PENCIL HANDSWITCHING (ELECTRODE) ×2 IMPLANT
RELOAD 45 VASCULAR/THIN (ENDOMECHANICALS) ×2 IMPLANT
RELOAD STAPLE 45 2.5 WHT GRN (ENDOMECHANICALS) IMPLANT
RELOAD STAPLE TA45 3.5 REG BLU (ENDOMECHANICALS) IMPLANT
SET BASIN LINEN APH (SET/KITS/TRAYS/PACK) ×2 IMPLANT
SET TUBE IRRIG SUCTION NO TIP (IRRIGATION / IRRIGATOR) IMPLANT
SHEARS HARMONIC ACE PLUS 36CM (ENDOMECHANICALS) ×2 IMPLANT
SPONGE GAUZE 2X2 8PLY STRL LF (GAUZE/BANDAGES/DRESSINGS) ×2 IMPLANT
STAPLER VISISTAT (STAPLE) ×2 IMPLANT
SUT VICRYL 0 UR6 27IN ABS (SUTURE) ×2 IMPLANT
SYR 20CC LL (SYRINGE) ×4 IMPLANT
SYS BAG RETRIEVAL 10MM (BASKET) ×1
SYSTEM BAG RETRIEVAL 10MM (BASKET) ×1 IMPLANT
TAPE PAPER 2X10 WHT MICROPORE (GAUZE/BANDAGES/DRESSINGS) ×1 IMPLANT
TRAY FOLEY W/BAG SLVR 16FR (SET/KITS/TRAYS/PACK) ×2
TRAY FOLEY W/BAG SLVR 16FR ST (SET/KITS/TRAYS/PACK) ×1 IMPLANT
TROCAR ENDO BLADELESS 11MM (ENDOMECHANICALS) ×2 IMPLANT
TROCAR ENDO BLADELESS 12MM (ENDOMECHANICALS) ×2 IMPLANT
TROCAR XCEL NON-BLD 5MMX100MML (ENDOMECHANICALS) ×2 IMPLANT
TUBING INSUFFLATION (TUBING) ×2 IMPLANT
WARMER LAPAROSCOPE (MISCELLANEOUS) ×2 IMPLANT
YANKAUER SUCT 12FT TUBE ARGYLE (SUCTIONS) ×2 IMPLANT

## 2018-05-20 NOTE — Op Note (Signed)
Patient:  Alexandra Price  DOB:  01-25-1946  MRN:  [id number]   Preop Diagnosis: Acute appendicitis  Postop Diagnosis: Same  Procedure: Laparoscopic appendectomy  Surgeon: [name], MD  Anes: General endotracheal  Indications: Patient is a 72 year old white female with a history of multiple myeloma who presents with a four-day history of worsening right lower quadrant abdominal pain.  CT scan of the abdomen reveals acute appendicitis.  The risks and benefits of the procedure including bleeding, infection, and the possibility of an open procedure were fully explained to the patient, who gave informed consent.  Procedure note: Patient was placed in the supine position.  After induction of general endotracheal anesthesia, the abdomen was prepped and draped using the usual sterile technique with DuraPrep.  Surgical site confirmation was performed.  A supraumbilical incision was made down to the fascia.  A Veress needle was introduced into the abdominal cavity and confirmation of placement was done using saline drop test.  The abdomen was then insufflated to 21mHg pressure.  An 11 mm trocar was introduced into the abdominal cavity under direct visualization without difficulty.  The patient was placed in deeper Trendelenburg position and an additional 12 mm trocar was placed in the suprapubic region and a 5 mm trocar was placed left lower quadrant region.  The appendix was visualized and noted to be acutely inflamed.  The mesoappendix was divided using the harmonic scalpel down to the juncture of the appendix with the cecum.  A vascular Endo GIA was placed across this juncture and fired.  The staple line was inspected and noted to be within normal limits.  Arista was placed along the staple line.  The appendix was delivered using an Endo Catch bag without difficulty.  All fluid and air were then evacuated from the abdominal cavity prior to removal of the trochars.  All wounds were irrigated with  normal saline.  All wounds were injected with Exparel.  The supraumbilical fascia as well as suprapubic fascia were reapproximated using 0 Vicryl interrupted sutures.  All skin incisions were closed using staples.  Betadine ointment and dry sterile dressings were applied.  All tape and needle counts were correct at the end of the procedure.  Patient was extubated in the operating room and transferred to PACU in stable condition.  Complications: None  EBL: Minimal  Specimen: Appendix

## 2018-05-20 NOTE — Anesthesia Postprocedure Evaluation (Signed)
Anesthesia Post Note  Patient: Alexandra Price  Procedure(s) Performed: APPENDECTOMY LAPAROSCOPIC (N/A Abdomen)  Patient location during evaluation: PACU Anesthesia Type: General Level of consciousness: awake and alert Pain management: pain level controlled Vital Signs Assessment: post-procedure vital signs reviewed and stable Respiratory status: spontaneous breathing and patient connected to nasal cannula oxygen Cardiovascular status: stable Postop Assessment: no apparent nausea or vomiting Anesthetic complications: no     Last Vitals:  Vitals:   05/20/18 1330 05/20/18 1434  BP: 117/63 120/69  Pulse: 91 91  Resp: 12   Temp:  37.2 C  SpO2: 96%     Last Pain:  Vitals:   05/20/18 1434  TempSrc: Oral  PainSc: 7                  Ottis Sarnowski A

## 2018-05-20 NOTE — Anesthesia Procedure Notes (Signed)
Procedure Name: Intubation Date/Time: 05/20/2018 3:16 PM Performed by: Vista Deck, CRNA Pre-anesthesia Checklist: Patient identified, Patient being monitored, Timeout performed, Emergency Drugs available and Suction available Patient Re-evaluated:Patient Re-evaluated prior to induction Oxygen Delivery Method: Circle System Utilized Preoxygenation: Pre-oxygenation with 100% oxygen Induction Type: IV induction, Rapid sequence and Cricoid Pressure applied Laryngoscope Size: Mac and 3 Grade View: Grade II Tube type: Oral Tube size: 7.0 mm Number of attempts: 1 Airway Equipment and Method: stylet and Oral airway Placement Confirmation: ETT inserted through vocal cords under direct vision,  positive ETCO2 and breath sounds checked- equal and bilateral Secured at: 22 cm Tube secured with: Tape Dental Injury: Teeth and Oropharynx as per pre-operative assessment

## 2018-05-20 NOTE — ED Provider Notes (Signed)
Emergency Department Provider Note   I have reviewed the triage vital signs and the nursing notes.   HISTORY  Chief Complaint Abdominal Pain   HPI Alexandra Price is a 72 y.o. female with PMH of multiple myeloma with last infusion 9/27 followed by Dr. Tomie China to the emergency department with worsening abdominal pain, UTI symptoms, and cough.  The patient has had 4 days of worsening symptoms.  Her last chemotherapy was Friday.  She returned to the infusion clinic yesterday with constipation symptoms and dehydration.  She was given IV fluids and instructed to increase her lactulose for constipation, which she states that she did.  Her last bowel movement was this morning but states it was smaller than normal.  She denies diarrhea.  She has had nausea with dry heaving but no vomiting.  She does report some mild cough.  Denies noticing any fevers or shaking chills at home. Her abdominal pain is primarily right-sided.  No other modifying factors.  Denies chest pain or heart palpitations.   Past Medical History:  Diagnosis Date  . Anxiety   . Depression   . Multiple myeloma ([place])    multiple myeloma    Patient Active Problem List   Diagnosis Date Noted  . Multiple myeloma without remission ([place]) 04/15/2018  . Goals of care, counseling/discussion 04/15/2018  . Multiple myeloma not having achieved remission ([place]) 04/07/2018    Past Surgical History:  Procedure Laterality Date  . ABDOMINAL HYSTERECTOMY     total  . PORTACATH PLACEMENT Right 04/14/2018   Procedure: INSERTION PORT-A-CATH;  Surgeon: [name], MD;  Location: [facility];  Service: General;  Laterality: Right;    Allergies Morphine and related  Family History  Problem Relation Age of Onset  . Heart disease Mother   . Emphysema Father   . Diabetes Sister   . Cancer Brother        liver, lung, and colon  . Diabetes Brother     Social History Social History   Tobacco Use  . Smoking status: Never Smoker    . Smokeless tobacco: Never Used  Substance Use Topics  . Alcohol use: Never    Frequency: Never  . Drug use: Not Currently    Review of Systems  Constitutional: Positive fever with weakness.  Eyes: No visual changes. ENT: No sore throat. Cardiovascular: Denies chest pain. Respiratory: Denies shortness of breath. Positive cough. Gastrointestinal: Positive right sided abdominal pain. Positive nausea, no vomiting.  No diarrhea. Positive constipation. Genitourinary: Positive dysuria starting yesterday.  Musculoskeletal: Negative for back pain. Skin: Negative for rash. Neurological: Negative for headaches, focal weakness or numbness.  10-point ROS otherwise negative.  ____________________________________________   PHYSICAL EXAM:  VITAL SIGNS: ED Triage Vitals  Enc Vitals Group     BP 05/20/18 1025 138/75     Pulse Rate 05/20/18 1025 (!) 109     Resp 05/20/18 1025 18     Temp 05/20/18 1025 (!) 100.5 F (38.1 C)     Temp Source 05/20/18 1025 Oral     SpO2 05/20/18 1025 98 %     Weight 05/20/18 1022 159 lb 6.3 oz (72.3 kg)     Height 05/20/18 1022 5' 5"  (1.651 m)     Pain Score 05/20/18 1022 6   Constitutional: Alert and oriented. Well appearing and in no acute distress. Eyes: Conjunctivae are normal.  Head: Atraumatic. Nose: No congestion/rhinnorhea. Mouth/Throat: Mucous membranes are dry.  Neck: No stridor.  Cardiovascular: Tachycardia. Good peripheral circulation. Grossly  normal heart sounds.   Respiratory: Normal respiratory effort.  No retractions. Lungs CTAB. Gastrointestinal: Soft with mild/moderate right sided abdominal tenderness. No rebound or guarding. No distention.  Musculoskeletal: No lower extremity tenderness nor edema. No gross deformities of extremities. Neurologic:  Normal speech and language. No gross focal neurologic deficits are appreciated.  Skin:  Skin is warm, dry and intact. No rash noted.  ____________________________________________    LABS (all labs ordered are listed, but only abnormal results are displayed)  Labs Reviewed  COMPREHENSIVE METABOLIC PANEL - Abnormal; Notable for the following components:      Result Value   Sodium 130 (*)    Chloride 96 (*)    Glucose, Bld 158 (*)    BUN 25 (*)    Creatinine, Ser 2.13 (*)    Calcium 7.6 (*)    Albumin 3.0 (*)    Alkaline Phosphatase 150 (*)    GFR calc non Af Amer 22 (*)    GFR calc Af Amer 26 (*)    All other components within normal limits  CBC WITH DIFFERENTIAL/PLATELET - Abnormal; Notable for the following components:   WBC 14.8 (*)    RBC 2.96 (*)    Hemoglobin 9.0 (*)    HCT 27.7 (*)    All other components within normal limits  URINALYSIS, ROUTINE W REFLEX MICROSCOPIC - Abnormal; Notable for the following components:   APPearance CLOUDY (*)    Glucose, UA 50 (*)    Hgb urine dipstick SMALL (*)    Protein, ur 30 (*)    Leukocytes, UA LARGE (*)    WBC, UA >50 (*)    Bacteria, UA RARE (*)    All other components within normal limits  CULTURE, BLOOD (ROUTINE X 2)  CULTURE, BLOOD (ROUTINE X 2)  URINE CULTURE  I-STAT CG4 LACTIC ACID, ED  I-STAT CG4 LACTIC ACID, ED   ____________________________________________  EKG  Sinus tachycardia. No ST elevation or depression. No STEMI. Normal intervals. Normal axis.  ____________________________________________  RADIOLOGY  Ct Abdomen Pelvis Wo Contrast  Result Date: 05/20/2018 CLINICAL DATA:  Right lower quadrant pain for 4 days, constipation, some nausea, history of multiple myeloma EXAM: CT ABDOMEN AND PELVIS WITHOUT CONTRAST TECHNIQUE: Multidetector CT imaging of the abdomen and pelvis was performed following the standard protocol without IV contrast. COMPARISON:  CT abdomen pelvis of 05/11/2017 FINDINGS: Lower chest: Linear scarring or atelectasis is again noted medially at the left lung base. No pneumonia or pleural effusion is seen. Heart size is stable. Hepatobiliary: No focal liver abnormality is  seen. No gallstones, gallbladder wall thickening, or biliary dilatation. The liver is unremarkable in the unenhanced state. No calcified gallstones are seen. Pancreas: The pancreas is normal in size and the pancreatic duct is not dilated. Spleen: The spleen is unremarkable. There is a small splenic artery calcification near the splenic hilus. Adrenals/Urinary Tract: The adrenal glands appear normal. No renal calculi are seen and there is no evidence of hydronephrosis. The ureters are normal in caliber. The urinary bladder is not well distended but no abnormality is seen. Stomach/Bowel: The stomach is largely decompressed. No small bowel abnormality is seen. No abnormality of the colon is seen other than possible edema at the base of the cecum. The appendix appears thickened and fluid filled extending caudally and anteriorly toward the right inguinal canal region. The appendix measures up to 13 mm in diameter. There is some strandiness of the surrounding soft tissue planes but no evidence of rupture or abscess formation  is seen. Vascular/Lymphatic: Abdominal aorta is normal in caliber with mild abdominal aortic atherosclerosis for age. No adenopathy is seen with only small retroperitoneal nodes and mesenteric nodes present. Reproductive: The uterus has previously been resected. No adnexal lesion is seen. A tiny amount of fluid layers in the pelvis. Other: No abdominal wall hernia is seen Musculoskeletal: Diffuse bone lesions are present in this patient with known multiple myeloma with partial compression of anterior superior aspect of L4 and T11 again noted. IMPRESSION: 1. Dilated edematous and somewhat rigid appendix measuring up to 13 mm in diameter with some surrounding strandiness of the fat planes indicative of acute appendicitis. No complicating features are seen. 2. Diffuse bone lesions consistent with known multiple myeloma. Electronically Signed   By: Ivar Drape M.D.   On: 05/20/2018 13:17   Dg Chest 2  View  Result Date: 05/20/2018 CLINICAL DATA:  Cough, dizziness, and chest pain. History of multiple myeloma. Nonsmoker. EXAM: CHEST - 2 VIEW COMPARISON:  PA and lateral chest x-ray of May 11, 2018 FINDINGS: The lungs are adequately inflated. The interstitial markings are coarse though stable. Confluent density projecting over the posterior aspect of the right seventh rib is consistent with a myomatous rib lesion and is stable. The heart and pulmonary vascularity are normal. There is faint calcification in the wall of the aortic arch. The power port catheter tip projects over the midportion of the SVC. There is chronic partial compression of the body of T11 or T12. There are innumerable permeative lesions within the humeri, clavicles, and scapulae. There is deformity of the lateral aspect of the right fourth rib consistent with a previous fracture. IMPRESSION: Chronic bronchitic changes. No alveolar pneumonia nor pulmonary edema. Extensive myomatous involvement of the visualized bony structures. Thoracic aortic atherosclerosis. Electronically Signed   By: David  Martinique M.D.   On: 05/20/2018 11:36    ____________________________________________   PROCEDURES  Procedure(s) performed:   Procedures  CRITICAL CARE Performed by: Margette Fast Total critical care time: 35 minutes Critical care time was exclusive of separately billable procedures and treating other patients. Critical care was necessary to treat or prevent imminent or life-threatening deterioration. Critical care was time spent personally by me on the following activities: development of treatment plan with patient and/or surrogate as well as nursing, discussions with consultants, evaluation of patient's response to treatment, examination of patient, obtaining history from patient or surrogate, ordering and performing treatments and interventions, ordering and review of laboratory studies, ordering and review of radiographic studies,  pulse oximetry and re-evaluation of patient's condition.  Nanda Quinton, MD Emergency Medicine  ____________________________________________   INITIAL IMPRESSION / ASSESSMENT AND PLAN / ED COURSE  Pertinent labs & imaging results that were available during my care of the patient were reviewed by me and considered in my medical decision making (see chart for details).  Patient presents to the ED with abdominal pain with constipation, nausea, UTI symptoms, and found to have fever on arrival here.  Her last chemotherapy was 4 days ago.  She does have mild tachycardia but no hypotension.  Labs including cultures were sent.  Given patient's history plan for chest x-ray, CT imaging of the abdomen pelvis, and IV fluids.  CT imaging reviewed with concern for appendicitis. Labs with leukocytosis. Patient does have right sided abdominal tenderness. Discussed case with surgery on call, Dr. Arnoldo Morale, who plans to take to the OR. Patient not anticoagulated. Last PO was yesterday. Paged Oncology to make aware but did not receive call back.  Patient taken to the OR.   Discussed patient's case with Surgery, Dr. Arnoldo Morale to request admission. Patient and family (if present) updated with plan. Care transferred to Surgery service.  I reviewed all nursing notes, vitals, pertinent old records, EKGs, labs, imaging (as available).  ____________________________________________  FINAL CLINICAL IMPRESSION(S) / ED DIAGNOSES  Final diagnoses:  Other acute appendicitis     MEDICATIONS GIVEN DURING THIS VISIT:  Medications  fentaNYL (SUBLIMAZE) injection 50 mcg ( Intravenous MAR Hold 05/20/18 1413)  iopamidol (ISOVUE-300) 61 % injection (has no administration in time range)  sodium chloride 0.9 % bolus 1,000 mL (0 mLs Intravenous Stopped 05/20/18 1309)  acetaminophen (TYLENOL) tablet 1,000 mg (1,000 mg Oral Given 05/20/18 1142)  piperacillin-tazobactam (ZOSYN) IVPB 3.375 g (3.375 g Intravenous New Bag/Given 05/20/18  1340)    Note:  This document was prepared using Dragon voice recognition software and may include unintentional dictation errors.  Nanda Quinton, MD Emergency Medicine    Long, [facility], MD 05/20/18 [phone]

## 2018-05-20 NOTE — ED Triage Notes (Signed)
Pt reports constipation for 4 days with no relief from OTC meds. Pt is a cancer patient and last treatment was Friday. Pt also complaining of burning with urination.

## 2018-05-20 NOTE — Telephone Encounter (Signed)
Patient called stating it has now been four days since last bowel movement with yesterdays instructions to use the Lactulose 23ml every 2 hours.  Patient stated her abdomen is tender to touch.  Reviewed with Dr. Delton Coombes with verbal order to go to the emergency room.  Spoke with the patient who verbalized understanding.

## 2018-05-20 NOTE — Anesthesia Preprocedure Evaluation (Addendum)
Anesthesia Evaluation  Patient identified by MRN, date of birth, ID band Patient awake    Reviewed: Allergy & Precautions, H&P , NPO status , Patient's Chart, lab work & pertinent test results, reviewed documented beta blocker date and time   Airway Mallampati: II  TM Distance: >3 FB Neck ROM: full    Dental no notable dental hx. (+) Teeth Intact, Dental Advidsory Given   Pulmonary neg pulmonary ROS,    Pulmonary exam normal breath sounds clear to auscultation       Cardiovascular Exercise Tolerance: Good negative cardio ROS   Rhythm:regular Rate:Normal     Neuro/Psych PSYCHIATRIC DISORDERS Anxiety Depression negative neurological ROS  negative psych ROS   GI/Hepatic negative GI ROS, Neg liver ROS,   Endo/Other  negative endocrine ROS  Renal/GU negative Renal ROS  negative genitourinary   Musculoskeletal   Abdominal   Peds  Hematology negative hematology ROS (+)   Anesthesia Other Findings Multiple myeloma, active, not in remission  Reproductive/Obstetrics negative OB ROS                             Anesthesia Physical Anesthesia Plan  ASA: III  Anesthesia Plan: General   Post-op Pain Management:    Induction:   PONV Risk Score and Plan:   Airway Management Planned:   Additional Equipment:   Intra-op Plan:   Post-operative Plan:   Informed Consent: I have reviewed the patients History and Physical, chart, labs and discussed the procedure including the risks, benefits and alternatives for the proposed anesthesia with the patient or authorized representative who has indicated his/her understanding and acceptance.   Dental Advisory Given  Plan Discussed with: CRNA and Anesthesiologist  Anesthesia Plan Comments:        Anesthesia Quick Evaluation

## 2018-05-20 NOTE — H&P (Signed)
Alexandra Price is an 72 y.o. female.   Chief Complaint: Right lower quadrant abdominal pain HPI: Alexandra Price is a 72 year old white female who presented to the emergency room with a four-day history of worsening lower abdominal pain.  CT scan of the abdomen reveals acute appendicitis.  Alexandra Price states Alexandra Price pain is 8 out of 10.  The Alexandra Price is currently undergoing treatment for multiple myeloma.  Past Medical History:  Diagnosis Date  . Anxiety   . Depression   . Multiple myeloma ([place])    multiple myeloma    Past Surgical History:  Procedure Laterality Date  . ABDOMINAL HYSTERECTOMY     total  . PORTACATH PLACEMENT Right 04/14/2018   Procedure: INSERTION PORT-A-CATH;  Surgeon: [name], MD;  Location: [facility];  Service: General;  Laterality: Right;    Family History  Problem Relation Age of Onset  . Heart disease Mother   . Emphysema Father   . Diabetes Sister   . Cancer Brother        liver, lung, and colon  . Diabetes Brother    Social History:  reports that she has never smoked. She has never used smokeless tobacco. She reports that she has current or past drug history. She reports that she does not drink alcohol.  Allergies:  Allergies  Allergen Reactions  . Morphine And Related Nausea And Vomiting    Medications Prior to Admission  Medication Sig Dispense Refill  . acyclovir (ZOVIRAX) 400 MG tablet Take 1 tablet (400 mg total) by mouth daily. 30 tablet 3  . allopurinol (ZYLOPRIM) 300 MG tablet Take 1 tablet (300 mg total) by mouth daily. 30 tablet 1  . ALPRAZolam (XANAX) 0.25 MG tablet Take 1 tablet (0.25 mg total) by mouth 2 (two) times daily as needed for anxiety. 60 tablet 0  . aspirin EC 81 MG tablet Take 81 mg by mouth daily.    . Calcium Carb-Cholecalciferol (CALCIUM 1000 + D PO) Take 1,000 mg by mouth daily. Take one tablet daily until next visit with oncologist.    . Carfilzomib (KYPROLIS IV) Inject into the vein 2 (two) times a week. Thursdays and Fridays    .  dexamethasone (DECADRON) 4 MG tablet Take 5 tablets (20 mg) on days 1, 2, 8, 9, 15 and 16 on the days of chemotherapy.  Take 10 tablets during the week of no chemotherapy. 40 tablet 4  . HYDROcodone-acetaminophen (NORCO) 5-325 MG tablet Take 1 tablet by mouth 2 (two) times daily. 60 tablet 0  . lactulose (CHRONULAC) 10 GM/15ML solution Take 15 to 30 ml at bedtime every night to assist with moving bowels.  Titrate down if having multiple bowel movements.  If bowels hav not moved in 3 to 4 days or longer-may give 15 to 30 ml every 2 hours until bowlel movement then resume taking 15 to 30 ml every night. 240 mL 1  . lenalidomide (REVLIMID) 10 MG capsule Take 1 capsule (10 mg total) by mouth daily. 14 capsule 0  . lidocaine-prilocaine (EMLA) cream Apply to affected area once 30 g 3  . lisinopril (PRINIVIL,ZESTRIL) 10 MG tablet Take 1 tablet (10 mg total) by mouth daily. 30 tablet 1  . ondansetron (ZOFRAN) 4 MG tablet Take 1 tablet (4 mg total) by mouth every 8 (eight) hours as needed for nausea or vomiting. 20 tablet 1  . pantoprazole (PROTONIX) 40 MG tablet Take 1 tablet (40 mg total) by mouth daily. 30 tablet 0  . prochlorperazine (COMPAZINE) 10 MG tablet  Take 1 tablet (10 mg total) by mouth every 6 (six) hours as needed (Nausea or vomiting). 30 tablet 1  . promethazine (PHENERGAN) 25 MG tablet Take 1 tablet (25 mg total) by mouth every 6 (six) hours as needed. 15 tablet 0  . temazepam (RESTORIL) 15 MG capsule Take 1 capsule (15 mg total) by mouth at bedtime as needed for sleep. 30 capsule 0  . traMADol (ULTRAM) 50 MG tablet Take 1 tablet (50 mg total) by mouth every 6 (six) hours as needed. 60 tablet 0  . trolamine salicylate (ASPERCREME) 10 % cream Apply 1 application topically daily as needed for muscle pain.    . clonazePAM (KLONOPIN) 0.5 MG tablet Take 1 tablet (0.5 mg total) by mouth at bedtime as needed for anxiety (insomnia). (Alexandra Price not taking: Reported on 05/20/2018) 7 tablet 0    Results for  orders placed or performed during the hospital encounter of 05/20/18 (from the past 48 hour(s))  Urinalysis, Routine w reflex microscopic     Status: Abnormal   Collection Time: 05/20/18 10:40 AM  Result Value Ref Range   Color, Urine YELLOW YELLOW   APPearance CLOUDY (A) CLEAR   Specific Gravity, Urine 1.011 1.005 - 1.030   pH 7.0 5.0 - 8.0   Glucose, UA 50 (A) NEGATIVE mg/dL   Hgb urine dipstick SMALL (A) NEGATIVE   Bilirubin Urine NEGATIVE NEGATIVE   Ketones, ur NEGATIVE NEGATIVE mg/dL   Protein, ur 30 (A) NEGATIVE mg/dL   Nitrite NEGATIVE NEGATIVE   Leukocytes, UA LARGE (A) NEGATIVE   RBC / HPF 21-50 0 - 5 RBC/hpf   WBC, UA >50 (H) 0 - 5 WBC/hpf   Bacteria, UA RARE (A) NONE SEEN   Squamous Epithelial / LPF 0-5 0 - 5   WBC Clumps PRESENT    Mucus PRESENT     Comment: Performed at [facility], [address] [place], [place] [address]  Comprehensive metabolic panel     Status: Abnormal   Collection Time: 05/20/18 10:58 AM  Result Value Ref Range   Sodium 130 (L) 135 - 145 mmol/L   Potassium 4.5 3.5 - 5.1 mmol/L   Chloride 96 (L) 98 - 111 mmol/L   CO2 24 22 - 32 mmol/L   Glucose, Bld 158 (H) 70 - 99 mg/dL   BUN 25 (H) 8 - 23 mg/dL   Creatinine, Ser 2.13 (H) 0.44 - 1.00 mg/dL   Calcium 7.6 (L) 8.9 - 10.3 mg/dL   Total Protein 7.8 6.5 - 8.1 g/dL   Albumin 3.0 (L) 3.5 - 5.0 g/dL   AST 18 15 - 41 U/L   ALT 21 0 - 44 U/L   Alkaline Phosphatase 150 (H) 38 - 126 U/L   Total Bilirubin 0.5 0.3 - 1.2 mg/dL   GFR calc non Af Amer 22 (L) >60 mL/min   GFR calc Af Amer 26 (L) >60 mL/min    Comment: (NOTE) The eGFR has been calculated using the CKD EPI equation. This calculation has not been validated in all clinical situations. eGFR's persistently <60 mL/min signify possible Chronic Kidney Disease.    Anion gap 10 5 - 15    Comment: Performed at [facility], [address], [place], [place] [address]  CBC WITH DIFFERENTIAL     Status: Abnormal   Collection Time: 05/20/18 10:58  AM  Result Value Ref Range   WBC 14.8 (H) 4.0 - 10.5 K/uL   RBC 2.96 (L) 3.87 - 5.11 MIL/uL   Hemoglobin 9.0 (  L) 12.0 - 15.0 g/dL   HCT 27.7 (L) 36.0 - 46.0 %   MCV 93.6 78.0 - 100.0 fL   MCH 30.4 26.0 - 34.0 pg   MCHC 32.5 30.0 - 36.0 g/dL   RDW 14.6 11.5 - 15.5 %   Platelets 170 150 - 400 K/uL   Neutrophils Relative % 92 %   Neutro Abs 13.7 1.7 - 7.7 K/uL   Lymphocytes Relative 3 %   Lymphs Abs 0.4 0.7 - 4.0 K/uL   Monocytes Relative 2 %   Monocytes Absolute 0.3 0.1 - 1.0 K/uL   Eosinophils Relative 3 %   Eosinophils Absolute 0.4 0.0 - 0.7 K/uL   Basophils Relative 0 %   Basophils Absolute 0.0 0.0 - 0.1 K/uL   WBC Morphology MILD LEFT SHIFT (1-5% METAS, OCC MYELO, OCC BANDS)     Comment: Performed at [facility], [address], [place], [place] [address]  Blood Culture (routine x 2)     Status: None (Preliminary result)   Collection Time: 05/20/18 10:58 AM  Result Value Ref Range   Specimen Description BLOOD LEFT HAND    Special Requests      BOTTLES DRAWN AEROBIC AND ANAEROBIC Blood Culture adequate volume Performed at [facility], [address], [place], [place] [address]    Culture PENDING    Report Status PENDING   Blood Culture (routine x 2)     Status: None (Preliminary result)   Collection Time: 05/20/18 11:06 AM  Result Value Ref Range   Specimen Description BLOOD RIGHT ANTECUBITAL    Special Requests      BOTTLES DRAWN AEROBIC AND ANAEROBIC Blood Culture adequate volume Performed at [facility], [address] [place], [place] [address]    Culture PENDING    Report Status PENDING   I-Stat CG4 Lactic Acid, ED  (not at  [facility])     Status: None   Collection Time: 05/20/18 11:15 AM  Result Value Ref Range   Lactic Acid, Venous 1.86 0.5 - 1.9 mmol/L   Ct Abdomen Pelvis Wo Contrast  Result Date: 05/20/2018 CLINICAL DATA:  Right lower quadrant pain for 4 days, constipation, some nausea, history of multiple myeloma EXAM: CT ABDOMEN AND PELVIS WITHOUT CONTRAST  TECHNIQUE: Multidetector CT imaging of the abdomen and pelvis was performed following the standard protocol without IV contrast. COMPARISON:  CT abdomen pelvis of 05/11/2017 FINDINGS: Lower chest: Linear scarring or atelectasis is again noted medially at the left lung base. No pneumonia or pleural effusion is seen. Heart size is stable. Hepatobiliary: No focal liver abnormality is seen. No gallstones, gallbladder wall thickening, or biliary dilatation. The liver is unremarkable in the unenhanced state. No calcified gallstones are seen. Pancreas: The pancreas is normal in size and the pancreatic duct is not dilated. Spleen: The spleen is unremarkable. There is a small splenic artery calcification near the splenic hilus. Adrenals/Urinary Tract: The adrenal glands appear normal. No renal calculi are seen and there is no evidence of hydronephrosis. The ureters are normal in caliber. The urinary bladder is not well distended but no abnormality is seen. Stomach/Bowel: The stomach is largely decompressed. No small bowel abnormality is seen. No abnormality of the colon is seen other than possible edema at the base of the cecum. The appendix appears thickened and fluid filled extending caudally and anteriorly toward the right inguinal canal region. The appendix measures up to 13 mm in diameter. There is some strandiness of the surrounding soft tissue planes but no evidence of  rupture or abscess formation is seen. Vascular/Lymphatic: Abdominal aorta is normal in caliber with mild abdominal aortic atherosclerosis for age. No adenopathy is seen with only small retroperitoneal nodes and mesenteric nodes present. Reproductive: The uterus has previously been resected. No adnexal lesion is seen. A tiny amount of fluid layers in the pelvis. Other: No abdominal wall hernia is seen Musculoskeletal: Diffuse bone lesions are present in this Alexandra Price with known multiple myeloma with partial compression of anterior superior aspect of L4  and T11 again noted. IMPRESSION: 1. Dilated edematous and somewhat rigid appendix measuring up to 13 mm in diameter with some surrounding strandiness of the fat planes indicative of acute appendicitis. No complicating features are seen. 2. Diffuse bone lesions consistent with known multiple myeloma. Electronically Signed   By: Ivar Drape M.D.   On: 05/20/2018 13:17   Dg Chest 2 View  Result Date: 05/20/2018 CLINICAL DATA:  Cough, dizziness, and chest pain. History of multiple myeloma. Nonsmoker. EXAM: CHEST - 2 VIEW COMPARISON:  PA and lateral chest x-ray of May 11, 2018 FINDINGS: The lungs are adequately inflated. The interstitial markings are coarse though stable. Confluent density projecting over the posterior aspect of the right seventh rib is consistent with a myomatous rib lesion and is stable. The heart and pulmonary vascularity are normal. There is faint calcification in the wall of the aortic arch. The power port catheter tip projects over the midportion of the SVC. There is chronic partial compression of the body of T11 or T12. There are innumerable permeative lesions within the humeri, clavicles, and scapulae. There is deformity of the lateral aspect of the right fourth rib consistent with a previous fracture. IMPRESSION: Chronic bronchitic changes. No alveolar pneumonia nor pulmonary edema. Extensive myomatous involvement of the visualized bony structures. Thoracic aortic atherosclerosis. Electronically Signed   By: David  Martinique M.D.   On: 05/20/2018 11:36    Review of Systems  Constitutional: Positive for malaise/fatigue.  HENT: Negative.   Eyes: Negative.   Respiratory: Negative.   Cardiovascular: Negative.   Gastrointestinal: Negative.   Genitourinary: Negative.   Skin: Negative.   Neurological: Positive for focal weakness.  Endo/Heme/Allergies: Negative.   Psychiatric/Behavioral: Positive for depression. The Alexandra Price is nervous/anxious.     Blood pressure 117/63, pulse 91,  temperature (!) 100.5 F (38.1 C), temperature source Oral, resp. rate 12, height _0  (1.651 m), weight 72.3 kg, SpO2 96 %. Physical Exam  Vitals reviewed. Constitutional: She is oriented to person, place, and time. She appears well-developed and well-nourished. No distress.  HENT:  Head: Normocephalic and atraumatic.  Cardiovascular: Normal rate, regular rhythm and normal heart sounds. Exam reveals no gallop and no friction rub.  No murmur heard. Respiratory: Effort normal and breath sounds normal. No respiratory distress. She has no wheezes. She has no rales.  GI: Soft. Bowel sounds are normal. She exhibits no distension. There is tenderness. There is guarding. There is no rebound.  Tender in the right lower quadrant to palpation.  No rigidity is noted.  Neurological: She is alert and oriented to person, place, and time.  Skin: Skin is warm and dry.  CT scan images personally reviewed Assessment/Plan Impression: Acute appendicitis Plan: Alexandra Price will be going to the operating room for laparoscopic appendectomy.  The risks and benefits of the procedure including bleeding, infection, and the possibility of an open procedure were fully explained to the Alexandra Price, who gave informed consent.  [name], MD 05/20/2018, 2:34 PM

## 2018-05-20 NOTE — Progress Notes (Signed)
[name] CSW Progress Notes  CSW visited w patient in ED, reviewed options for increased social support.  Has appt at [facility] (Dr Modesta Messing) on 10/3.  Did not go to scheduled comprehensive clinical assessment at [facility] on 9/30 - "I called and they said I didn't have an appt at [facility]  [place] and [facility] share a building, but appointments are kept on a separate calendar.  Pt's husband was in room.  Reviewed events leading up to patient's current situation.  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 Had friends/social connections as well as ability to get around independently.  Was outgoing, a "people person" per both patient and husband.  Patient had struggled w anxiety, had been prescribed Xanax by PCP (Dr Patrice Paradise?) in the past which was "helpful."  Current presentation of limited emotional expression and depressed/anxious mood is uncharacteristic for patient, began after medical problems started earlier this year.  Patient rolled over in bed and broke her shoulder in Jan 2019.  Also broke ribs.  Worked w PCP and MDs in [facility] trying to determine why she was fracturing bones easily, "they could not get to the bottom of it, said everything was normal."  In effort to get medical care to determine the cause of her symptoms, she moved in w son in [place].  Husband remained in [place], taking care of their property.  Comes to this area periodically to help out.  Son stays at home, husband is retired and both are able to help w transportation needs.    Patient is grateful for Dr Tomie China diagnosis of multiple myeloma and appreciates treatment.  However, often feels "unwell", limited energy, little ability to recreate friends and social support she had in former hometown.  Willing to explore option of increased support through therapy, medication management from psychiatrist.  Also encouraged to come to cancer support group (2nd  Tuesday of month at 1 PM).  Information card given.  CSW will explore other options for referral for therapy - patient lives equidistant from [place] and [place] and transportation does not appear to be an issue.  Edwyna Shell, LCSW Clinical Social Worker Phone:  [phone]

## 2018-05-20 NOTE — Transfer of Care (Signed)
Immediate Anesthesia Transfer of Care Note  Patient: Alexandra Price  Procedure(s) Performed: APPENDECTOMY LAPAROSCOPIC (N/A Abdomen)  Patient Location: PACU  Anesthesia Type:General  Level of Consciousness: awake, oriented and patient cooperative  Airway & Oxygen Therapy: Patient Spontanous Breathing  Post-op Assessment: Report given to RN and Post -op Vital signs reviewed and stable  Post vital signs: Reviewed and stable  Last Vitals:  Vitals Value Taken Time  BP 130/63 05/20/2018  4:05 PM  Temp    Pulse 105 05/20/2018  4:07 PM  Resp 13 05/20/2018  4:07 PM  SpO2 97 % 05/20/2018  4:07 PM  Vitals shown include unvalidated device data.  Last Pain:  Vitals:   05/20/18 1434  TempSrc: Oral  PainSc: 7       Patients Stated Pain Goal: 5 (21/19/41 7408)  Complications: No apparent anesthesia complications

## 2018-05-20 NOTE — Progress Notes (Signed)
[name] CSW Progress Notes  Patient has Part A of Medicare only, will need to be referred to provider who can take patients on private pay basis.  Spoke w [facility] clinician, who can make referrals for members in their catchment area.  Appointment for comprehensive clinical assessment made at [facility] at 9 AM on Oct 2nd ([address] [place]).  Bring information on medication list and insurance card.  Patient and husband notified.    Edwyna Shell, LCSW Clinical Social Worker Phone:  [phone]

## 2018-05-21 ENCOUNTER — Encounter (HOSPITAL_COMMUNITY): Payer: Self-pay | Admitting: General Surgery

## 2018-05-21 ENCOUNTER — Telehealth (HOSPITAL_COMMUNITY): Payer: Self-pay | Admitting: *Deleted

## 2018-05-21 LAB — CBC
HCT: 24.2 % — ABNORMAL LOW (ref 36.0–46.0)
Hemoglobin: 7.9 g/dL — ABNORMAL LOW (ref 12.0–15.0)
MCH: 30.7 pg (ref 26.0–34.0)
MCHC: 32.6 g/dL (ref 30.0–36.0)
MCV: 94.2 fL (ref 78.0–100.0)
PLATELETS: 147 10*3/uL — AB (ref 150–400)
RBC: 2.57 MIL/uL — ABNORMAL LOW (ref 3.87–5.11)
RDW: 15 % (ref 11.5–15.5)
WBC: 15 10*3/uL — ABNORMAL HIGH (ref 4.0–10.5)

## 2018-05-21 LAB — BASIC METABOLIC PANEL
Anion gap: 12 (ref 5–15)
BUN: 26 mg/dL — ABNORMAL HIGH (ref 8–23)
CHLORIDE: 100 mmol/L (ref 98–111)
CO2: 20 mmol/L — AB (ref 22–32)
CREATININE: 2.32 mg/dL — AB (ref 0.44–1.00)
Calcium: 6.7 mg/dL — ABNORMAL LOW (ref 8.9–10.3)
GFR calc non Af Amer: 20 mL/min — ABNORMAL LOW (ref >60)
GFR, EST AFRICAN AMERICAN: 23 mL/min — AB (ref >60)
Glucose, Bld: 340 mg/dL — ABNORMAL HIGH (ref 70–99)
POTASSIUM: 4.6 mmol/L (ref 3.5–5.1)
Sodium: 132 mmol/L — ABNORMAL LOW (ref 135–145)

## 2018-05-21 LAB — MRSA PCR SCREENING: MRSA BY PCR: POSITIVE — AB

## 2018-05-21 MED ORDER — POLYETHYLENE GLYCOL 3350 17 G PO PACK
17.0000 g | PACK | Freq: Once | ORAL | Status: AC
  Administered 2018-05-21: 17 g via ORAL
  Filled 2018-05-21: qty 1

## 2018-05-21 MED ORDER — CHLORHEXIDINE GLUCONATE CLOTH 2 % EX PADS: 6.0000 | MEDICATED_PAD | Freq: Every day | CUTANEOUS | Status: DC

## 2018-05-21 MED ORDER — MUPIROCIN 2 % EX OINT
1.0000 "application " | TOPICAL_OINTMENT | Freq: Two times a day (BID) | CUTANEOUS | Status: DC
  Administered 2018-05-21: 1 via NASAL
  Filled 2018-05-21: qty 22

## 2018-05-21 MED ORDER — CIPROFLOXACIN HCL 500 MG PO TABS: 500.0000 mg | ORAL_TABLET | Freq: Two times a day (BID) | ORAL | 0 refills | Status: DC

## 2018-05-21 NOTE — Plan of Care (Signed)
Adequate for discharge.

## 2018-05-21 NOTE — Discharge Summary (Signed)
Physician Discharge Summary  Patient ID: Alexandra Price MRN: [id number] DOB/AGE: 1946/02/01 72 y.o.  Admit date: 05/20/2018 Discharge date: 05/21/2018  Admission Diagnoses: Acute appendicitis  Discharge Diagnoses: Same Active Problems:   Appendicitis, acute   S/P laparoscopic appendectomy Multiple myeloma  Discharged Condition: good  Hospital Course: Patient is a 72 year old white female currently being treated for multiple myeloma who presented to the emergency room with worsening right lower quadrant abdominal pain.  CT scan of the abdomen revealed acute appendicitis.  Patient was taken to the operating room and underwent laparoscopic appendectomy on 05/20/2018.  She tolerated procedure well.  Her postoperative course was remarkable for some abnormal lab values which were consistent with her recently being treated for multiple myeloma.  Her hyperglycemia was secondary to receiving Solu-Cortef in the perioperative period due to recent history of Decadron use.  She states she is back at her baseline.  She is being discharged home in stable condition.  Oncology is aware.  Treatments: surgery: Laparoscopic appendectomy on 05/20/2018  Discharge Exam: Blood pressure 111/63, pulse 87, temperature 98.2 F (36.8 C), temperature source Oral, resp. rate 17, height 5' 5"  (1.651 m), weight 75 kg, SpO2 95 %. General appearance: alert, cooperative and no distress Resp: clear to auscultation bilaterally Cardio: regular rate and rhythm, S1, S2 normal, no murmur, click, rub or gallop GI: Soft, dressing is dry and intact.  Disposition: Discharge disposition: 01-Home or Self Care       Discharge Instructions    Diet - low sodium heart healthy   Complete by:  As directed    Increase activity slowly   Complete by:  As directed      Allergies as of 05/21/2018      Reactions   Morphine And Related Nausea And Vomiting      Medication List    TAKE these medications   acyclovir 400 MG  tablet Commonly known as:  ZOVIRAX Take 1 tablet (400 mg total) by mouth daily.   allopurinol 300 MG tablet Commonly known as:  ZYLOPRIM Take 1 tablet (300 mg total) by mouth daily.   ALPRAZolam 0.25 MG tablet Commonly known as:  XANAX Take 1 tablet (0.25 mg total) by mouth 2 (two) times daily as needed for anxiety.   aspirin EC 81 MG tablet Take 81 mg by mouth daily.   CALCIUM 1000 + D PO Take 1,000 mg by mouth daily. Take one tablet daily until next visit with oncologist.   ciprofloxacin 500 MG tablet Commonly known as:  CIPRO Take 1 tablet (500 mg total) by mouth 2 (two) times daily.   dexamethasone 4 MG tablet Commonly known as:  DECADRON Take 5 tablets (20 mg) on days 1, 2, 8, 9, 15 and 16 on the days of chemotherapy.  Take 10 tablets during the week of no chemotherapy.   HYDROcodone-acetaminophen 5-325 MG tablet Commonly known as:  NORCO/VICODIN Take 1 tablet by mouth 2 (two) times daily.   KYPROLIS IV Inject into the vein 2 (two) times a week. Thursdays and Fridays   lactulose 10 GM/15ML solution Commonly known as:  CHRONULAC Take 15 to 30 ml at bedtime every night to assist with moving bowels.  Titrate down if having multiple bowel movements.  If bowels hav not moved in 3 to 4 days or longer-may give 15 to 30 ml every 2 hours until bowlel movement then resume taking 15 to 30 ml every night.   lenalidomide 10 MG capsule Commonly known as:  REVLIMID Take 1 capsule (  10 mg total) by mouth daily.   lidocaine-prilocaine cream Commonly known as:  EMLA Apply to affected area once   lisinopril 10 MG tablet Commonly known as:  PRINIVIL,ZESTRIL Take 1 tablet (10 mg total) by mouth daily.   ondansetron 4 MG tablet Commonly known as:  ZOFRAN Take 1 tablet (4 mg total) by mouth every 8 (eight) hours as needed for nausea or vomiting.   pantoprazole 40 MG tablet Commonly known as:  PROTONIX Take 1 tablet (40 mg total) by mouth daily.   prochlorperazine 10 MG  tablet Commonly known as:  COMPAZINE Take 1 tablet (10 mg total) by mouth every 6 (six) hours as needed (Nausea or vomiting).   promethazine 25 MG tablet Commonly known as:  PHENERGAN Take 1 tablet (25 mg total) by mouth every 6 (six) hours as needed.   temazepam 15 MG capsule Commonly known as:  RESTORIL Take 1 capsule (15 mg total) by mouth at bedtime as needed for sleep.   traMADol 50 MG tablet Commonly known as:  ULTRAM Take 1 tablet (50 mg total) by mouth every 6 (six) hours as needed.   trolamine salicylate 10 % cream Commonly known as:  ASPERCREME Apply 1 application topically daily as needed for muscle pain.      Follow-up Information    [name], MD. Schedule an appointment as soon as possible for a visit on 06/03/2018.   Specialty:  General Surgery Contact information: [id number] [place] [address] [phone]           Signed: [name] 05/21/2018, 9:45 AM

## 2018-05-21 NOTE — Discharge Instructions (Signed)
Laparoscopic Appendectomy, Adult, Care After °Refer to this sheet in the next few weeks. These instructions provide you with information about caring for yourself after your procedure. Your health care provider may also give you more specific instructions. Your treatment has been planned according to current medical practices, but problems sometimes occur. Call your health care provider if you have any problems or questions after your procedure. °What can I expect after the procedure? °After the procedure, it is common to have: °· A decrease in your energy level. °· Mild pain in the area where the surgical cuts (incisions) were made. °· Constipation. This can be caused by pain medicine and a decrease in your activity. ° °Follow these instructions at home: °Medicines °· Take over-the-counter and prescription medicines only as told by your health care provider. °· Do not drive for 24 hours if you received a sedative. °· Do not drive or operate heavy machinery while taking prescription pain medicine. °· If you were prescribed an antibiotic medicine, take it as told by your health care provider. Do not stop taking the antibiotic even if you start to feel better. °Activity °· For 3 weeks or as long as told by your health care provider: °? Do not lift anything that is heavier than 10 pounds (4.5 kg). °? Do not play contact sports. °· Gradually return to your normal activities. Ask your health care provider what activities are safe for you. °Bathing °· Keep your incisions clean and dry. Clean them as often as told by your health care provider: °? Gently wash the incisions with soap and water. °? Rinse the incisions with water to remove all soap. °? Pat the incisions dry with a clean towel. Do not rub the incisions. °· You may take showers after 48 hours. °· Do not take baths, swim, or use hot tubs for 2 weeks or as told by your health care provider. °Incision care °· Follow instructions from your healthcare provider about  how to take care of your incisions. Make sure you: °? Wash your hands with soap and water before you change your bandage (dressing). If soap and water are not available, use hand sanitizer. °? Change your dressing as told by your health care provider. °? Leave stitches (sutures), skin glue, or adhesive strips in place. These skin closures may need to stay in place for 2 weeks or longer. If adhesive strip edges start to loosen and curl up, you may trim the loose edges. Do not remove adhesive strips completely unless your health care provider tells you to do that. °· Check your incision areas every day for signs of infection. Check for: °? More redness, swelling, or pain. °? More fluid or blood. °? Warmth. °? Pus or a bad smell. °Other Instructions °· If you were sent home with a drain, follow instructions from your health care provider about how to care for the drain and how to empty it. °· Take deep breaths. This helps to prevent your lungs from becoming inflamed. °· To relieve and prevent constipation: °? Drink plenty of fluids. °? Eat plenty of fruits and vegetables. °· Keep all follow-up visits as told by your health care provider. This is important. °Contact a health care provider if: °· You have more redness, swelling, or pain around an incision. °· You have more fluid or blood coming from an incision. °· Your incision feels warm to the touch. °· You have pus or a bad smell coming from an incision or dressing. °· Your incision   edges break open after your sutures have been removed. °· You have increasing pain in your shoulders. °· You feel dizzy or you faint. °· You develop shortness of breath. °· You keep feeling nauseous or vomiting. °· You have diarrhea or you cannot control your bowel functions. °· You lose your appetite. °· You develop swelling or pain in your legs. °Get help right away if: °· You have a fever. °· You develop a rash. °· You have difficulty breathing. °· You have sharp pains in your  chest. °This information is not intended to replace advice given to you by your health care provider. Make sure you discuss any questions you have with your health care provider. °Document Released: 08/06/2005 Document Revised: 01/06/2016 Document Reviewed: 01/24/2015 °Elsevier Interactive Patient Education © 2018 [employer] ° °

## 2018-05-22 ENCOUNTER — Other Ambulatory Visit: Payer: Self-pay

## 2018-05-22 ENCOUNTER — Ambulatory Visit (HOSPITAL_COMMUNITY): Payer: Self-pay

## 2018-05-22 ENCOUNTER — Inpatient Hospital Stay (HOSPITAL_COMMUNITY)
Admission: EM | Admit: 2018-05-22 | Discharge: 2018-05-23 | DRG: 194 | Disposition: A | Discharge status: Home / Self Care | Payer: Medicare Other | Attending: Family Medicine | Admitting: Family Medicine

## 2018-05-22 ENCOUNTER — Ambulatory Visit (HOSPITAL_COMMUNITY): Payer: Self-pay | Admitting: Psychiatry

## 2018-05-22 ENCOUNTER — Encounter (HOSPITAL_COMMUNITY): Payer: Self-pay | Admitting: Emergency Medicine

## 2018-05-22 ENCOUNTER — Emergency Department (HOSPITAL_COMMUNITY): Payer: Medicare Other

## 2018-05-22 ENCOUNTER — Encounter

## 2018-05-22 ENCOUNTER — Other Ambulatory Visit (HOSPITAL_COMMUNITY): Payer: Self-pay

## 2018-05-22 DIAGNOSIS — R7989 Other specified abnormal findings of blood chemistry: Secondary | ICD-10-CM

## 2018-05-22 DIAGNOSIS — Z7982 Long term (current) use of aspirin: Secondary | ICD-10-CM

## 2018-05-22 DIAGNOSIS — Z825 Family history of asthma and other chronic lower respiratory diseases: Secondary | ICD-10-CM

## 2018-05-22 DIAGNOSIS — A419 Sepsis, unspecified organism: Secondary | ICD-10-CM

## 2018-05-22 DIAGNOSIS — R509 Fever, unspecified: Secondary | ICD-10-CM

## 2018-05-22 DIAGNOSIS — C9000 Multiple myeloma not having achieved remission: Secondary | ICD-10-CM

## 2018-05-22 DIAGNOSIS — F419 Anxiety disorder, unspecified: Secondary | ICD-10-CM | POA: Diagnosis present

## 2018-05-22 DIAGNOSIS — R000 Tachycardia, unspecified: Secondary | ICD-10-CM

## 2018-05-22 DIAGNOSIS — Y95 Nosocomial condition: Secondary | ICD-10-CM | POA: Diagnosis present

## 2018-05-22 DIAGNOSIS — Z800 Family history of malignant neoplasm of digestive organs: Secondary | ICD-10-CM

## 2018-05-22 DIAGNOSIS — J189 Pneumonia, unspecified organism: Principal | ICD-10-CM | POA: Diagnosis present

## 2018-05-22 DIAGNOSIS — D899 Disorder involving the immune mechanism, unspecified: Secondary | ICD-10-CM | POA: Diagnosis present

## 2018-05-22 DIAGNOSIS — E860 Dehydration: Secondary | ICD-10-CM | POA: Diagnosis present

## 2018-05-22 DIAGNOSIS — Z791 Long term (current) use of non-steroidal anti-inflammatories (NSAID): Secondary | ICD-10-CM

## 2018-05-22 DIAGNOSIS — M7989 Other specified soft tissue disorders: Secondary | ICD-10-CM

## 2018-05-22 DIAGNOSIS — K5900 Constipation, unspecified: Secondary | ICD-10-CM | POA: Diagnosis present

## 2018-05-22 DIAGNOSIS — Z90710 Acquired absence of both cervix and uterus: Secondary | ICD-10-CM

## 2018-05-22 DIAGNOSIS — N184 Chronic kidney disease, stage 4 (severe): Secondary | ICD-10-CM | POA: Diagnosis not present

## 2018-05-22 DIAGNOSIS — R300 Dysuria: Secondary | ICD-10-CM

## 2018-05-22 DIAGNOSIS — Z801 Family history of malignant neoplasm of trachea, bronchus and lung: Secondary | ICD-10-CM

## 2018-05-22 DIAGNOSIS — Z9049 Acquired absence of other specified parts of digestive tract: Secondary | ICD-10-CM | POA: Diagnosis not present

## 2018-05-22 DIAGNOSIS — Z8249 Family history of ischemic heart disease and other diseases of the circulatory system: Secondary | ICD-10-CM

## 2018-05-22 DIAGNOSIS — Z833 Family history of diabetes mellitus: Secondary | ICD-10-CM

## 2018-05-22 DIAGNOSIS — I1310 Hypertensive heart and chronic kidney disease without heart failure, with stage 1 through stage 4 chronic kidney disease, or unspecified chronic kidney disease: Secondary | ICD-10-CM | POA: Diagnosis present

## 2018-05-22 HISTORY — DX: Essential (primary) hypertension: I10

## 2018-05-22 LAB — URINALYSIS, ROUTINE W REFLEX MICROSCOPIC
Bilirubin Urine: NEGATIVE
GLUCOSE, UA: NEGATIVE mg/dL
Hgb urine dipstick: NEGATIVE
Ketones, ur: NEGATIVE mg/dL
Leukocytes, UA: NEGATIVE
Nitrite: NEGATIVE
PH: 6 (ref 5.0–8.0)
Protein, ur: 30 mg/dL — AB
SPECIFIC GRAVITY, URINE: 1.009 (ref 1.005–1.030)

## 2018-05-22 LAB — COMPREHENSIVE METABOLIC PANEL
ALK PHOS: 122 U/L (ref 38–126)
ALT: 38 U/L (ref 0–44)
ANION GAP: 11 (ref 5–15)
AST: 22 U/L (ref 15–41)
Albumin: 2.6 g/dL — ABNORMAL LOW (ref 3.5–5.0)
BUN: 23 mg/dL (ref 8–23)
CALCIUM: 6.7 mg/dL — AB (ref 8.9–10.3)
CO2: 22 mmol/L (ref 22–32)
CREATININE: 2.28 mg/dL — AB (ref 0.44–1.00)
Chloride: 99 mmol/L (ref 98–111)
GFR calc non Af Amer: 21 mL/min — ABNORMAL LOW (ref >60)
GFR, EST AFRICAN AMERICAN: 24 mL/min — AB (ref >60)
Glucose, Bld: 149 mg/dL — ABNORMAL HIGH (ref 70–99)
Potassium: 4 mmol/L (ref 3.5–5.1)
Sodium: 132 mmol/L — ABNORMAL LOW (ref 135–145)
TOTAL PROTEIN: 6.9 g/dL (ref 6.5–8.1)
Total Bilirubin: 0.5 mg/dL (ref 0.3–1.2)

## 2018-05-22 LAB — CK: Total CK: 18 U/L — ABNORMAL LOW (ref 38–234)

## 2018-05-22 LAB — I-STAT CG4 LACTIC ACID, ED
LACTIC ACID, VENOUS: 1.5 mmol/L (ref 0.5–1.9)
Lactic Acid, Venous: 1.36 mmol/L (ref 0.5–1.9)

## 2018-05-22 LAB — LIPASE, BLOOD: Lipase: 25 U/L (ref 11–51)

## 2018-05-22 LAB — PROTIME-INR
INR: 1.18
Prothrombin Time: 14.9 seconds (ref 11.4–15.2)

## 2018-05-22 LAB — I-STAT TROPONIN, ED: Troponin i, poc: 0 ng/mL (ref 0.00–0.08)

## 2018-05-22 MED ORDER — METRONIDAZOLE IN NACL 5-0.79 MG/ML-% IV SOLN
500.0000 mg | Freq: Three times a day (TID) | INTRAVENOUS | Status: DC
  Administered 2018-05-22 – 2018-05-23 (×3): 500 mg via INTRAVENOUS
  Filled 2018-05-22 (×2): qty 100

## 2018-05-22 MED ORDER — ASPIRIN EC 81 MG PO TBEC
81.0000 mg | DELAYED_RELEASE_TABLET | Freq: Every day | ORAL | Status: DC
  Administered 2018-05-22 – 2018-05-23 (×2): 81 mg via ORAL
  Filled 2018-05-22 (×2): qty 1

## 2018-05-22 MED ORDER — HYDROCODONE-ACETAMINOPHEN 5-325 MG PO TABS
1.0000 | ORAL_TABLET | Freq: Four times a day (QID) | ORAL | Status: DC | PRN
  Administered 2018-05-22 – 2018-05-23 (×3): 2 via ORAL
  Filled 2018-05-22 (×3): qty 2

## 2018-05-22 MED ORDER — ALLOPURINOL 300 MG PO TABS
300.0000 mg | ORAL_TABLET | Freq: Every day | ORAL | Status: DC
  Administered 2018-05-22 – 2018-05-23 (×2): 300 mg via ORAL
  Filled 2018-05-22 (×2): qty 1

## 2018-05-22 MED ORDER — VANCOMYCIN HCL IN DEXTROSE 1-5 GM/200ML-% IV SOLN: 1000.0000 mg | Freq: Once | INTRAVENOUS | Status: DC

## 2018-05-22 MED ORDER — SODIUM CHLORIDE 0.9 % IV BOLUS
1000.0000 mL | Freq: Once | INTRAVENOUS | Status: AC
  Administered 2018-05-22: 1000 mL via INTRAVENOUS

## 2018-05-22 MED ORDER — TRAZODONE HCL 50 MG PO TABS
50.0000 mg | ORAL_TABLET | Freq: Every evening | ORAL | Status: DC | PRN
  Administered 2018-05-23: 50 mg via ORAL
  Filled 2018-05-22: qty 1

## 2018-05-22 MED ORDER — ENSURE ENLIVE PO LIQD
237.0000 mL | Freq: Two times a day (BID) | ORAL | Status: DC
  Administered 2018-05-22 – 2018-05-23 (×3): 237 mL via ORAL

## 2018-05-22 MED ORDER — ENOXAPARIN SODIUM 30 MG/0.3ML ~~LOC~~ SOLN: 30.0000 mg | SUBCUTANEOUS | Status: DC

## 2018-05-22 MED ORDER — LISINOPRIL 10 MG PO TABS
10.0000 mg | ORAL_TABLET | Freq: Every day | ORAL | Status: DC
  Administered 2018-05-22 – 2018-05-23 (×2): 10 mg via ORAL
  Filled 2018-05-22 (×2): qty 1

## 2018-05-22 MED ORDER — ACETAMINOPHEN 325 MG PO TABS: 650.0000 mg | ORAL_TABLET | Freq: Four times a day (QID) | ORAL | Status: DC | PRN

## 2018-05-22 MED ORDER — PANTOPRAZOLE SODIUM 40 MG PO TBEC
40.0000 mg | DELAYED_RELEASE_TABLET | Freq: Every day | ORAL | Status: DC
  Administered 2018-05-22 – 2018-05-23 (×2): 40 mg via ORAL
  Filled 2018-05-22 (×2): qty 1

## 2018-05-22 MED ORDER — FENTANYL CITRATE (PF) 100 MCG/2ML IJ SOLN
12.5000 ug | INTRAMUSCULAR | Status: DC | PRN
  Administered 2018-05-22: 12.5 ug via INTRAVENOUS
  Filled 2018-05-22: qty 2

## 2018-05-22 MED ORDER — ACYCLOVIR 800 MG PO TABS
400.0000 mg | ORAL_TABLET | Freq: Every day | ORAL | Status: DC
Start: 2018-05-22 — End: 2018-05-23
  Administered 2018-05-22 – 2018-05-23 (×2): 400 mg via ORAL
  Filled 2018-05-22 (×2): qty 1

## 2018-05-22 MED ORDER — CHLORHEXIDINE GLUCONATE CLOTH 2 % EX PADS: 6.0000 | MEDICATED_PAD | Freq: Every day | CUTANEOUS | Status: DC

## 2018-05-22 MED ORDER — METRONIDAZOLE IN NACL 5-0.79 MG/ML-% IV SOLN
500.0000 mg | Freq: Three times a day (TID) | INTRAVENOUS | Status: DC
  Filled 2018-05-22: qty 100

## 2018-05-22 MED ORDER — VANCOMYCIN HCL 10 G IV SOLR
1500.0000 mg | Freq: Once | INTRAVENOUS | Status: AC
  Administered 2018-05-22: 1500 mg via INTRAVENOUS
  Filled 2018-05-22: qty 1500

## 2018-05-22 MED ORDER — LENALIDOMIDE 10 MG PO CAPS: 10.0000 mg | ORAL_CAPSULE | Freq: Every day | ORAL | Status: DC

## 2018-05-22 MED ORDER — SORBITOL 70 % SOLN
960.0000 mL | TOPICAL_OIL | Freq: Once | ORAL | Status: AC
  Administered 2018-05-22: 960 mL via RECTAL
  Filled 2018-05-22: qty 473

## 2018-05-22 MED ORDER — SODIUM CHLORIDE 0.9 % IV SOLN
1.0000 g | INTRAVENOUS | Status: DC
  Administered 2018-05-23: 1 g via INTRAVENOUS
  Filled 2018-05-22 (×4): qty 1

## 2018-05-22 MED ORDER — SODIUM CHLORIDE 0.9 % IV SOLN
INTRAVENOUS | Status: DC
  Administered 2018-05-22: 16:00:00 via INTRAVENOUS

## 2018-05-22 MED ORDER — MUPIROCIN 2 % EX OINT
1.0000 "application " | TOPICAL_OINTMENT | Freq: Two times a day (BID) | CUTANEOUS | Status: DC
  Administered 2018-05-22 – 2018-05-23 (×2): 1 via NASAL
  Filled 2018-05-22: qty 22

## 2018-05-22 MED ORDER — ALPRAZOLAM 0.25 MG PO TABS
0.2500 mg | ORAL_TABLET | Freq: Two times a day (BID) | ORAL | Status: DC | PRN
  Administered 2018-05-22 – 2018-05-23 (×2): 0.25 mg via ORAL
  Filled 2018-05-22 (×2): qty 1

## 2018-05-22 MED ORDER — POLYETHYLENE GLYCOL 3350 17 G PO PACK
17.0000 g | PACK | Freq: Two times a day (BID) | ORAL | Status: DC
  Administered 2018-05-22 – 2018-05-23 (×2): 17 g via ORAL
  Filled 2018-05-22 (×3): qty 1

## 2018-05-22 MED ORDER — SODIUM CHLORIDE 0.9 % IV SOLN
INTRAVENOUS | Status: DC | PRN
  Administered 2018-05-22: 12:00:00 via INTRAVENOUS

## 2018-05-22 MED ORDER — PROCHLORPERAZINE MALEATE 5 MG PO TABS: 10.0000 mg | ORAL_TABLET | Freq: Four times a day (QID) | ORAL | Status: DC | PRN

## 2018-05-22 MED ORDER — VANCOMYCIN HCL IN DEXTROSE 750-5 MG/150ML-% IV SOLN
750.0000 mg | INTRAVENOUS | Status: DC
  Administered 2018-05-23: 750 mg via INTRAVENOUS
  Filled 2018-05-22 (×4): qty 150

## 2018-05-22 MED ORDER — ONDANSETRON HCL 4 MG PO TABS
4.0000 mg | ORAL_TABLET | Freq: Three times a day (TID) | ORAL | Status: DC | PRN
  Administered 2018-05-22: 4 mg via ORAL
  Filled 2018-05-22: qty 1

## 2018-05-22 MED ORDER — SODIUM CHLORIDE 0.9 % IV SOLN
2.0000 g | Freq: Once | INTRAVENOUS | Status: AC
  Administered 2018-05-22: 2 g via INTRAVENOUS
  Filled 2018-05-22: qty 2

## 2018-05-22 MED ORDER — FENTANYL CITRATE (PF) 100 MCG/2ML IJ SOLN
25.0000 ug | INTRAMUSCULAR | Status: AC | PRN
  Administered 2018-05-22 (×2): 25 ug via INTRAVENOUS
  Filled 2018-05-22 (×2): qty 2

## 2018-05-22 MED ORDER — ENOXAPARIN SODIUM 40 MG/0.4ML ~~LOC~~ SOLN: 40.0000 mg | SUBCUTANEOUS | Status: DC

## 2018-05-22 MED ORDER — HYDROCODONE-ACETAMINOPHEN 5-325 MG PO TABS
1.0000 | ORAL_TABLET | ORAL | Status: DC | PRN
  Administered 2018-05-22: 1 via ORAL
  Filled 2018-05-22 (×2): qty 1

## 2018-05-22 NOTE — Progress Notes (Signed)
Pharmacy Antibiotic Note  Alexandra Price is a 72 y.o. female admitted on 05/22/2018 with fever with unknown source.  Pharmacy has been consulted for Cefepime and Vancomycin dosing.  Plan: Vancomycin 1500mg  loading dose, the 750mg  IV every 24 hours.  Goal trough 15-20 mcg/mL.  Cefepime 2gm IV loading dose, then 1gm IV q24h Also on Flagyl 500mg  IV q8h F/U cxs and clinical progress Monitor V/S, labs, and levels as indicated  Height: 5\' 5"  (165.1 cm) Weight: 159 lb (72.1 kg) IBW/kg (Calculated) : 57  Temp (24hrs), Avg:100 F (37.8 C), Min:99.4 F (37.4 C), Max:101 F (38.3 C)  Recent Labs  Lab 05/20/18 1058 05/20/18 1115 05/21/18 0356 05/22/18 1012 05/22/18 1032 05/22/18 1306  WBC 14.8*  --  15.0* 12.7*  --   --   CREATININE 2.13*  --  2.32* 2.28*  --   --   LATICACIDVEN  --  1.86  --   --  1.36 1.50    Estimated Creatinine Clearance: 22.8 mL/min (A) (by C-G formula based on SCr of 2.28 mg/dL (H)).    Allergies  Allergen Reactions  . Morphine And Related Nausea And Vomiting    Antimicrobials this admission: Vancomycin 10/3>>   Cefepime 10/3 >> Flagyl 10/3>>   Dose adjustments this admission: n/a  Microbiology results: 10/3 BCx: pending 10/3 UCx: pending 10/1 MRSA PCR: positive  Thank you for allowing pharmacy to be a part of this patient's care.  Isac Sarna, BS Pharm D, BCPS Clinical Pharmacist Pager [phone] 05/22/2018 2:08 PM

## 2018-05-22 NOTE — Progress Notes (Signed)
Called to patient's room by patient's husband with patient complaining of pain to right hand due to IV. Patient did not want IV removed and refused to let this RN place another one.  Patient was given Norco Prn dose at 1533, too early to give another dose.  Patient refused PRN Tylenol dose.  Will page Dr. Wynetta Emery to see he will order something else for pain.

## 2018-05-22 NOTE — H&P (Signed)
History and Physical  Alexandra Price RCB:[id number] DOB: 06-Oct-1945 DOA: 05/22/2018  Referring physician: Long  PCP: Patient, No Pcp Per   Chief Complaint: Fever   HPI: Alexandra Price is a 72 y.o. female who is currently being treated for multiple myeloma on chemotherapy and is postop day #2 status post laparoscopic appendectomy at this facility who presented to the emergency department complaining of fever and muscle aches in the arms.  The patient says that she did have a fever of 100.5 F at home prior to arrival.  She also reports that she has been having some was incisional abdominal pain.  She denies chills.  She does report that she had some mild pain with urination.  She denies shortness of breath and coughing.  She denies chest pain.  She also reports that she has not had a bowel movement in several days.  She was noted in the ED to have a rectal temperature of 101F.  Her oncologist was consulted and he has recommended that the patient be admitted for observation and broad-spectrum antibiotics and supportive care.   Review of Systems:  Constitutional: Positive fever and body aches.  Eyes: No visual changes. ENT: No sore throat. Cardiovascular: Denies chest pain. Respiratory: Denies shortness of breath. Gastrointestinal: Positive incisional abdominal pain.  No nausea, no vomiting.  No diarrhea.  (+) constipation. Genitourinary: Positive for dysuria. Musculoskeletal: Negative for back pain. Skin: Negative for rash. Neurological: Negative for headaches, focal weakness or numbness. All systems reviewed and apart from history of presenting illness, are negative.  Past Medical History:  Diagnosis Date  . Anxiety   . Depression   . Multiple myeloma ([place])    multiple myeloma   Past Surgical History:  Procedure Laterality Date  . ABDOMINAL HYSTERECTOMY     total  . LAPAROSCOPIC APPENDECTOMY N/A 05/20/2018   Procedure: APPENDECTOMY LAPAROSCOPIC;  Surgeon: [name], MD;   Location: [facility];  Service: General;  Laterality: N/A;  . PORTACATH PLACEMENT Right 04/14/2018   Procedure: INSERTION PORT-A-CATH;  Surgeon: [name], MD;  Location: [facility];  Service: General;  Laterality: Right;   Social History:  reports that she has never smoked. She has never used smokeless tobacco. She reports that she has current or past drug history. She reports that she does not drink alcohol.  Allergies  Allergen Reactions  . Morphine And Related Nausea And Vomiting    Family History  Problem Relation Age of Onset  . Heart disease Mother   . Emphysema Father   . Diabetes Sister   . Cancer Brother        liver, lung, and colon  . Diabetes Brother     Prior to Admission medications   Medication Sig Start Date End Date Taking? Authorizing Provider  acyclovir (ZOVIRAX) 400 MG tablet Take 1 tablet (400 mg total) by mouth daily. 04/15/18  Yes [name], MD  allopurinol (ZYLOPRIM) 300 MG tablet Take 1 tablet (300 mg total) by mouth daily. 04/24/18  Yes [name], MD  ALPRAZolam Duanne Moron) 0.25 MG tablet Take 1 tablet (0.25 mg total) by mouth 2 (two) times daily as needed for anxiety. 05/15/18  Yes [name], MD  aspirin EC 81 MG tablet Take 81 mg by mouth daily.   Yes [provider]  Calcium Carb-Cholecalciferol (CALCIUM 1000 + D PO) Take 1,000 mg by mouth daily. Take one tablet daily until next visit with oncologist.   Yes [provider]  ciprofloxacin (CIPRO) 500 MG tablet  Take 1 tablet (500 mg total) by mouth 2 (two) times daily. 05/21/18  Yes [name], MD  dexamethasone (DECADRON) 4 MG tablet Take 5 tablets (20 mg) on days 1, 2, 8, 9, 15 and 16 on the days of chemotherapy.  Take 10 tablets during the week of no chemotherapy. 04/15/18  Yes [name], MD  HYDROcodone-acetaminophen (NORCO) 5-325 MG tablet Take 1 tablet by mouth 2 (two) times daily. 05/15/18  Yes Lockamy, Randi L, NP-C  lactulose (CHRONULAC) 10 GM/15ML  solution Take 15 to 30 ml at bedtime every night to assist with moving bowels.  Titrate down if having multiple bowel movements.  If bowels hav not moved in 3 to 4 days or longer-may give 15 to 30 ml every 2 hours until bowlel movement then resume taking 15 to 30 ml every night. 05/19/18  Yes [name], MD  lenalidomide (REVLIMID) 10 MG capsule Take 1 capsule (10 mg total) by mouth daily. 05/14/18  Yes [name], MD  lidocaine-prilocaine (EMLA) cream Apply to affected area once 04/15/18  Yes [name], MD  lisinopril (PRINIVIL,ZESTRIL) 10 MG tablet Take 1 tablet (10 mg total) by mouth daily. 04/10/18  Yes Nat Christen, MD  ondansetron (ZOFRAN) 4 MG tablet Take 1 tablet (4 mg total) by mouth every 8 (eight) hours as needed for nausea or vomiting. 04/22/18  Yes Lockamy, Randi L, NP-C  pantoprazole (PROTONIX) 40 MG tablet Take 1 tablet (40 mg total) by mouth daily. 05/09/18  Yes Lockamy, Randi L, NP-C  prochlorperazine (COMPAZINE) 10 MG tablet Take 1 tablet (10 mg total) by mouth every 6 (six) hours as needed (Nausea or vomiting). 04/15/18  Yes [name], MD  promethazine (PHENERGAN) 25 MG tablet Take 1 tablet (25 mg total) by mouth every 6 (six) hours as needed. 04/27/18  Yes Nat Christen, MD  temazepam (RESTORIL) 15 MG capsule Take 1 capsule (15 mg total) by mouth at bedtime as needed for sleep. 04/24/18  Yes Lockamy, Randi L, NP-C  traMADol (ULTRAM) 50 MG tablet Take 1 tablet (50 mg total) by mouth every 6 (six) hours as needed. 04/15/18  Yes Lockamy, Randi L, NP-C  trolamine salicylate (ASPERCREME) 10 % cream Apply 1 application topically daily as needed for muscle pain.   Yes [provider]   Physical Exam: Vitals:   05/22/18 1041 05/22/18 1115 05/22/18 1130 05/22/18 1144  BP:  136/75 126/75   Pulse:  (!) 107 (!) 106   Resp:  17 19   Temp: (!) 101 F (38.3 C)   99.4 F (37.4 C)  TempSrc: Rectal   Oral  SpO2:  97% 94%   Weight:      Height:         General exam: Moderately built and nourished patient, lying comfortably supine on the gurney in no obvious distress.  Head, eyes and ENT: Nontraumatic and normocephalic. Pupils equally reacting to light and accommodation. Oral mucosa moist.  Neck: Supple. No JVD, carotid bruit or thyromegaly.  Lymphatics: No lymphadenopathy.  Respiratory system: Clear to auscultation. No increased work of breathing.  Cardiovascular system: S1 and S2 heard. Tachycardic.  No JVD, murmurs, gallops, clicks or pedal edema.  Gastrointestinal system:  Soft with mild diffuse tenderness. No distention. Well-appearing incisions with dry dressings. No surrounding tenderness or cellulitis.    Central nervous system: Alert and oriented. No focal neurological deficits.  Extremities: Symmetric 5 x 5 power. Peripheral pulses symmetrically felt.   Skin: No rashes or acute findings.  Musculoskeletal system: Negative  exam.  Psychiatry: Pleasant and cooperative.  Labs on Admission:  Basic Metabolic Panel: Recent Labs  Lab 05/20/18 1058 05/21/18 0356 05/22/18 1012  NA 130* 132* 132*  K 4.5 4.6 4.0  CL 96* 100 99  CO2 24 20* 22  GLUCOSE 158* 340* 149*  BUN 25* 26* 23  CREATININE 2.13* 2.32* 2.28*  CALCIUM 7.6* 6.7* 6.7*   Liver Function Tests: Recent Labs  Lab 05/20/18 1058 05/22/18 1012  AST 18 22  ALT 21 38  ALKPHOS 150* 122  BILITOT 0.5 0.5  PROT 7.8 6.9  ALBUMIN 3.0* 2.6*   Recent Labs  Lab 05/22/18 1036  LIPASE 25   No results for input(s): AMMONIA in the last 168 hours. CBC: Recent Labs  Lab 05/20/18 1058 05/21/18 0356 05/22/18 1012  WBC 14.8* 15.0* 12.7*  NEUTROABS 13.7  --  10.8  HGB 9.0* 7.9* 8.6*  HCT 27.7* 24.2* 26.0*  MCV 93.6 94.2 92.9  PLT 170 147* 133*   Cardiac Enzymes: No results for input(s): CKTOTAL, CKMB, CKMBINDEX, TROPONINI in the last 168 hours.  BNP (last 3 results) No results for input(s): PROBNP in the last 8760 hours. CBG: No results for  input(s): GLUCAP in the last 168 hours.  Radiological Exams on Admission: Dg Chest 1 View  Result Date: 05/22/2018 CLINICAL DATA:  Abdominal pain and fever in a patient with a history of multiple myeloma. EXAM: CHEST  1 VIEW COMPARISON:  PA and lateral chest 05/20/2018 and 05/11/2018. FINDINGS: Lung volumes are low but the lungs are clear. Heart size is normal. No pneumothorax or pleural effusion. Port-A-Cath is in place. Multiple lytic bony lesions consistent with multiple myeloma noted. IMPRESSION: No acute disease. Lytic bone lesions consistent with multiple myeloma. Electronically Signed   By: Inge Rise M.D.   On: 05/22/2018 11:17    EKG: Independently reviewed. Sinus tachycardia  Assessment/Plan Principal Problem:   Fever Active Problems:   Multiple myeloma not having achieved remission ([facility]CC)   S/P laparoscopic appendectomy   Sinus tachycardia   CKD (chronic kidney disease)   1. Fever of unknown source - Given that the patient is immunocompromised and on active chemotherapy treatments, she is being admitted for observation.  2. Multiple myeloma - Oncology team consulted in ED and agreed to see patient in consultation.    3. Sinus tachycardia - suspect mild dehydration, normal lactic acid, do not feel patient is sepsis but will monitor closely.   4. Stage 4 CKD - creatinine is stable from recent discharge.  Follow.  5. POD#2 s/p lap appendectomy -  ED physician spoke with Dr. Arnoldo Morale - no need to repeat CT abdomen at this time.  Wound healing well.  Follow clinical course.   6. Constipation - laxatives ordered.    DVT Prophylaxis: lovenox  Code Status: Full   Family Communication: bedside   Disposition Plan: home tomorrow if medically stable.    Time spent: 57 mins  Irwin Brakeman, MD [facility] Pager [phone]  If 7PM-7AM, please contact night-coverage [email or web address] Password [email or web address] 05/22/2018, 1:44 PM

## 2018-05-22 NOTE — ED Triage Notes (Signed)
Pt is a bone cancer pt currently receiving chemo, c/o fever, abdominal pain, and pain to bilateral arms/hands. Pt had appendectomy two days ago. Reports fever of 100.5, weakness, and fatigue. that began yesterday.

## 2018-05-22 NOTE — ED Provider Notes (Signed)
Emergency Department Provider Note   I have reviewed the triage vital signs and the nursing notes.   HISTORY  Chief Complaint Fever   HPI Alexandra Price is a 72 y.o. female with PMH of MM on chemotherapy POD # 2 s/p lap appendectomy presents to the emergency department for evaluation of fever, abdominal pain, muscle aches in the arms.  The patient recorded a fever at home of 100.5 F.  She states she is having some abdominal pain over the sites of incision.  She denies any shortness of breath or cough.  She does have some mild pain with urination.  No vomiting or diarrhea.  She has not had a bowel movement in several days. Denies any CP.    Past Medical History:  Diagnosis Date  . Anxiety   . Depression   . Multiple myeloma ([place])    multiple myeloma    Patient Active Problem List   Diagnosis Date Noted  . Fever 05/22/2018  . Sinus tachycardia 05/22/2018  . CKD (chronic kidney disease) 05/22/2018  . S/P laparoscopic appendectomy 05/20/2018  . Multiple myeloma without remission ([place]) 04/15/2018  . Goals of care, counseling/discussion 04/15/2018  . Multiple myeloma not having achieved remission ([place]) 04/07/2018    Past Surgical History:  Procedure Laterality Date  . ABDOMINAL HYSTERECTOMY     total  . LAPAROSCOPIC APPENDECTOMY N/A 05/20/2018   Procedure: APPENDECTOMY LAPAROSCOPIC;  Surgeon: [name], MD;  Location: [facility];  Service: General;  Laterality: N/A;  . PORTACATH PLACEMENT Right 04/14/2018   Procedure: INSERTION PORT-A-CATH;  Surgeon: [name], MD;  Location: [facility];  Service: General;  Laterality: Right;    Allergies Morphine and related  Family History  Problem Relation Age of Onset  . Heart disease Mother   . Emphysema Father   . Diabetes Sister   . Cancer Brother        liver, lung, and colon  . Diabetes Brother     Social History Social History   Tobacco Use  . Smoking status: Never Smoker  . Smokeless tobacco: Never Used    Substance Use Topics  . Alcohol use: Never    Frequency: Never  . Drug use: Not Currently    Review of Systems  Constitutional: Positive fever/chills and body aches.  Eyes: No visual changes. ENT: No sore throat. Cardiovascular: Denies chest pain. Respiratory: Denies shortness of breath. Gastrointestinal: Positive abdominal pain.  No nausea, no vomiting.  No diarrhea. Positive  constipation. Genitourinary: Positive for dysuria. Musculoskeletal: Negative for back pain. Skin: Negative for rash. Neurological: Negative for headaches, focal weakness or numbness.  10-point ROS otherwise negative.  ____________________________________________   PHYSICAL EXAM:  VITAL SIGNS: ED Triage Vitals  Enc Vitals Group     BP 05/22/18 1009 132/83     Pulse Rate 05/22/18 1009 (!) 108     Resp 05/22/18 1009 17     Temp 05/22/18 1009 99.6 F (37.6 C)     Temp Source 05/22/18 1009 Oral     SpO2 05/22/18 1009 95 %     Weight 05/22/18 1006 159 lb (72.1 kg)     Height 05/22/18 1006 5' 5"  (1.651 m)     Pain Score 05/22/18 1006 8   Constitutional: Alert and oriented. Well appearing and in no acute distress. Eyes: Conjunctivae are normal.  Head: Atraumatic. Nose: No congestion/rhinnorhea. Mouth/Throat: Mucous membranes are moist.  Neck: No stridor.   Cardiovascular: Tachycardia. Good peripheral circulation. Grossly normal heart sounds.   Respiratory:  Normal respiratory effort.  No retractions. Lungs CTAB. Gastrointestinal: Soft with mild diffuse tenderness. No distention. Well-appearing incisions with dry dressings. No surrounding tenderness or cellulitis.  Musculoskeletal: No lower extremity tenderness nor edema. No gross deformities of extremities. Neurologic:  Normal speech and language. No gross focal neurologic deficits are appreciated.  Skin:  Skin is warm, dry and intact. No rash noted.  ____________________________________________   LABS (all labs ordered are listed, but only  abnormal results are displayed)  Labs Reviewed  COMPREHENSIVE METABOLIC PANEL - Abnormal; Notable for the following components:      Result Value   Sodium 132 (*)    Glucose, Bld 149 (*)    Creatinine, Ser 2.28 (*)    Calcium 6.7 (*)    Albumin 2.6 (*)    GFR calc non Af Amer 21 (*)    GFR calc Af Amer 24 (*)    All other components within normal limits  CBC WITH DIFFERENTIAL/PLATELET - Abnormal; Notable for the following components:   WBC 12.7 (*)    RBC 2.80 (*)    Hemoglobin 8.6 (*)    HCT 26.0 (*)    Platelets 133 (*)    All other components within normal limits  URINALYSIS, ROUTINE W REFLEX MICROSCOPIC - Abnormal; Notable for the following components:   Protein, ur 30 (*)    Bacteria, UA RARE (*)    All other components within normal limits  CULTURE, BLOOD (ROUTINE X 2)  CULTURE, BLOOD (ROUTINE X 2)  URINE CULTURE  PROTIME-INR  LIPASE, BLOOD  I-STAT CG4 LACTIC ACID, ED  I-STAT CG4 LACTIC ACID, ED  I-STAT TROPONIN, ED   ____________________________________________  EKG   EKG Interpretation  Date/Time:  Thursday May 22 2018 11:46:12 EDT Ventricular Rate:  104 PR Interval:    QRS Duration: 100 QT Interval:  348 QTC Calculation: 458 R Axis:   75 Text Interpretation:  Sinus tachycardia No STEMI.  Confirmed by Nanda Quinton [id number]) on 05/22/2018 11:49:09 AM       ____________________________________________  RADIOLOGY  Dg Chest 1 View  Result Date: 05/22/2018 CLINICAL DATA:  Abdominal pain and fever in a patient with a history of multiple myeloma. EXAM: CHEST  1 VIEW COMPARISON:  PA and lateral chest 05/20/2018 and 05/11/2018. FINDINGS: Lung volumes are low but the lungs are clear. Heart size is normal. No pneumothorax or pleural effusion. Port-A-Cath is in place. Multiple lytic bony lesions consistent with multiple myeloma noted. IMPRESSION: No acute disease. Lytic bone lesions consistent with multiple myeloma. Electronically Signed   By: Inge Rise  M.D.   On: 05/22/2018 11:17    ____________________________________________   PROCEDURES  Procedure(s) performed:   Procedures  CRITICAL CARE Performed by: Margette Fast Total critical care time: 35 minutes Critical care time was exclusive of separately billable procedures and treating other patients. Critical care was necessary to treat or prevent imminent or life-threatening deterioration. Critical care was time spent personally by me on the following activities: development of treatment plan with patient and/or surrogate as well as nursing, discussions with consultants, evaluation of patient's response to treatment, examination of patient, obtaining history from patient or surrogate, ordering and performing treatments and interventions, ordering and review of laboratory studies, ordering and review of radiographic studies, pulse oximetry and re-evaluation of patient's condition.  Nanda Quinton, MD Emergency Medicine  ____________________________________________   INITIAL IMPRESSION / ASSESSMENT AND PLAN / ED COURSE  Pertinent labs & imaging results that were available during my care of the patient were  reviewed by me and considered in my medical decision making (see chart for details).  Return to the emergency department 2 days after laparoscopic appendectomy with fever.  She is complicated and she is also on chemotherapy for multiple myeloma.  Her abdomen seems appropriately tender after surgery.  The incision sites are well-appearing.  She has no significant lung findings on exam and no hypoxemia.  She does scribes some mild dysuria.  Given her chemotherapy status plan for antibiotics, IV fluids, and reassess.   2:08 PM WBC count is down-trending. Patient with normal lactate here. Abdomen appears appropriately tender to palpation. Spoke with Dr. Arnoldo Morale regarding the presentation and he is ok with no CT abdomen/pelvis at this time. Exam is reassuring and POD # 2 would not be  enough time to develop an intra-abdominal infection. UA pending but patient sent home on Cipro. Plan to discuss case with Oncology.   2:08 PM Spoke with Dr. Tommi Rumps with Oncology. Agrees with abx and obs admit. Will discuss with the hospitalist.   Discussed patient's case with Hospitalist, Dr. Wynetta Emery to request admission. Patient and family (if present) updated with plan. Care transferred to Hospitalist service.  I reviewed all nursing notes, vitals, pertinent old records, EKGs, labs, imaging (as available).  ____________________________________________  FINAL CLINICAL IMPRESSION(S) / ED DIAGNOSES  Final diagnoses:  Fever, unspecified fever cause  Dysuria     MEDICATIONS GIVEN DURING THIS VISIT:  Medications  metroNIDAZOLE (FLAGYL) IVPB 500 mg (has no administration in time range)  0.9 %  sodium chloride infusion ( Intravenous New Bag/Given 05/22/18 1142)  HYDROcodone-acetaminophen (NORCO/VICODIN) 5-325 MG per tablet 1 tablet (has no administration in time range)  ceFEPIme (MAXIPIME) 2 g in sodium chloride 0.9 % 100 mL IVPB (0 g Intravenous Stopped 05/22/18 1140)  sodium chloride 0.9 % bolus 1,000 mL (0 mLs Intravenous Stopped 05/22/18 1104)  vancomycin (VANCOCIN) 1,500 mg in sodium chloride 0.9 % 500 mL IVPB (1,500 mg Intravenous New Bag/Given 05/22/18 1144)    Note:  This document was prepared using Dragon voice recognition software and may include unintentional dictation errors.  Nanda Quinton, MD Emergency Medicine    Amberli Ruegg, [facility], MD 05/22/18 [phone]

## 2018-05-23 ENCOUNTER — Inpatient Hospital Stay (HOSPITAL_COMMUNITY): Payer: Medicare Other

## 2018-05-23 ENCOUNTER — Encounter (HOSPITAL_COMMUNITY): Payer: Self-pay

## 2018-05-23 ENCOUNTER — Observation Stay (HOSPITAL_COMMUNITY): Payer: Medicare Other

## 2018-05-23 ENCOUNTER — Ambulatory Visit (HOSPITAL_COMMUNITY): Payer: Self-pay

## 2018-05-23 DIAGNOSIS — Z833 Family history of diabetes mellitus: Secondary | ICD-10-CM | POA: Diagnosis not present

## 2018-05-23 DIAGNOSIS — Z791 Long term (current) use of non-steroidal anti-inflammatories (NSAID): Secondary | ICD-10-CM | POA: Diagnosis not present

## 2018-05-23 DIAGNOSIS — K5900 Constipation, unspecified: Secondary | ICD-10-CM | POA: Diagnosis present

## 2018-05-23 DIAGNOSIS — Y95 Nosocomial condition: Secondary | ICD-10-CM | POA: Diagnosis present

## 2018-05-23 DIAGNOSIS — Z90710 Acquired absence of both cervix and uterus: Secondary | ICD-10-CM | POA: Diagnosis not present

## 2018-05-23 DIAGNOSIS — Z800 Family history of malignant neoplasm of digestive organs: Secondary | ICD-10-CM | POA: Diagnosis not present

## 2018-05-23 DIAGNOSIS — I1310 Hypertensive heart and chronic kidney disease without heart failure, with stage 1 through stage 4 chronic kidney disease, or unspecified chronic kidney disease: Secondary | ICD-10-CM | POA: Diagnosis present

## 2018-05-23 DIAGNOSIS — F419 Anxiety disorder, unspecified: Secondary | ICD-10-CM | POA: Diagnosis present

## 2018-05-23 DIAGNOSIS — R509 Fever, unspecified: Secondary | ICD-10-CM | POA: Diagnosis present

## 2018-05-23 DIAGNOSIS — Z8249 Family history of ischemic heart disease and other diseases of the circulatory system: Secondary | ICD-10-CM | POA: Diagnosis not present

## 2018-05-23 DIAGNOSIS — Z825 Family history of asthma and other chronic lower respiratory diseases: Secondary | ICD-10-CM | POA: Diagnosis not present

## 2018-05-23 DIAGNOSIS — Z7982 Long term (current) use of aspirin: Secondary | ICD-10-CM | POA: Diagnosis not present

## 2018-05-23 DIAGNOSIS — Z801 Family history of malignant neoplasm of trachea, bronchus and lung: Secondary | ICD-10-CM | POA: Diagnosis not present

## 2018-05-23 DIAGNOSIS — E860 Dehydration: Secondary | ICD-10-CM | POA: Diagnosis present

## 2018-05-23 DIAGNOSIS — C9000 Multiple myeloma not having achieved remission: Secondary | ICD-10-CM | POA: Diagnosis present

## 2018-05-23 DIAGNOSIS — J189 Pneumonia, unspecified organism: Secondary | ICD-10-CM | POA: Diagnosis present

## 2018-05-23 DIAGNOSIS — D899 Disorder involving the immune mechanism, unspecified: Secondary | ICD-10-CM | POA: Diagnosis present

## 2018-05-23 DIAGNOSIS — N184 Chronic kidney disease, stage 4 (severe): Secondary | ICD-10-CM | POA: Diagnosis present

## 2018-05-23 LAB — COMPREHENSIVE METABOLIC PANEL
ALT: 48 U/L — ABNORMAL HIGH (ref 0–44)
ANION GAP: 9 (ref 5–15)
AST: 25 U/L (ref 15–41)
Albumin: 2.5 g/dL — ABNORMAL LOW (ref 3.5–5.0)
Alkaline Phosphatase: 128 U/L — ABNORMAL HIGH (ref 38–126)
BILIRUBIN TOTAL: 0.6 mg/dL (ref 0.3–1.2)
BUN: 21 mg/dL (ref 8–23)
CO2: 23 mmol/L (ref 22–32)
Calcium: 6.5 mg/dL — ABNORMAL LOW (ref 8.9–10.3)
Chloride: 105 mmol/L (ref 98–111)
Creatinine, Ser: 2.2 mg/dL — ABNORMAL HIGH (ref 0.44–1.00)
GFR calc Af Amer: 25 mL/min — ABNORMAL LOW (ref >60)
GFR calc non Af Amer: 21 mL/min — ABNORMAL LOW (ref >60)
GLUCOSE: 165 mg/dL — AB (ref 70–99)
POTASSIUM: 4.3 mmol/L (ref 3.5–5.1)
SODIUM: 137 mmol/L (ref 135–145)
TOTAL PROTEIN: 6.3 g/dL — AB (ref 6.5–8.1)

## 2018-05-23 LAB — CBC WITH DIFFERENTIAL/PLATELET
BASOS ABS: 0 10*3/uL (ref 0.0–0.1)
BASOS PCT: 0 %
EOS PCT: 7 %
Eosinophils Absolute: 1 10*3/uL — ABNORMAL HIGH (ref 0.0–0.7)
HEMATOCRIT: 26.7 % — AB (ref 36.0–46.0)
Hemoglobin: 8.6 g/dL — ABNORMAL LOW (ref 12.0–15.0)
Lymphocytes Relative: 7 %
Lymphs Abs: 1 10*3/uL (ref 0.7–4.0)
MCH: 30.5 pg (ref 26.0–34.0)
MCHC: 32.2 g/dL (ref 30.0–36.0)
MCV: 94.7 fL (ref 78.0–100.0)
MONOS PCT: 4 %
Monocytes Absolute: 0.5 10*3/uL (ref 0.1–1.0)
NEUTROS ABS: 11.1 10*3/uL — AB (ref 1.7–7.7)
Neutrophils Relative %: 82 %
Platelets: 125 10*3/uL — ABNORMAL LOW (ref 150–400)
RBC: 2.82 MIL/uL — ABNORMAL LOW (ref 3.87–5.11)
RDW: 15.6 % — ABNORMAL HIGH (ref 11.5–15.5)
WBC Morphology: INCREASED
WBC: 13.6 10*3/uL — ABNORMAL HIGH (ref 4.0–10.5)

## 2018-05-23 LAB — C-REACTIVE PROTEIN: CRP: 14.7 mg/dL — ABNORMAL HIGH (ref <1.0)

## 2018-05-23 LAB — URINE CULTURE
Culture: >=100000 — AB
Special Requests: NORMAL

## 2018-05-23 LAB — D-DIMER, QUANTITATIVE: D-Dimer, Quant: 8.22 ug/mL-FEU — ABNORMAL HIGH (ref 0.00–0.50)

## 2018-05-23 LAB — LACTIC ACID, PLASMA: LACTIC ACID, VENOUS: 1.3 mmol/L (ref 0.5–1.9)

## 2018-05-23 LAB — CK: Total CK: 14 U/L — ABNORMAL LOW (ref 38–234)

## 2018-05-23 MED ORDER — ENSURE ENLIVE PO LIQD: 237.0000 mL | Freq: Two times a day (BID) | ORAL | 0 refills | Status: AC

## 2018-05-23 MED ORDER — POLYETHYLENE GLYCOL 3350 17 G PO PACK: 17.0000 g | PACK | Freq: Every day | ORAL | 0 refills | Status: AC

## 2018-05-23 MED ORDER — ACYCLOVIR 400 MG PO TABS: 400.0000 mg | ORAL_TABLET | Freq: Two times a day (BID) | ORAL | 3 refills | Status: DC

## 2018-05-23 MED ORDER — HYDROMORPHONE HCL 1 MG/ML IJ SOLN
0.5000 mg | INTRAMUSCULAR | Status: AC | PRN
  Administered 2018-05-23 (×2): 1 mg via INTRAVENOUS
  Filled 2018-05-23 (×2): qty 1

## 2018-05-23 MED ORDER — TECHNETIUM TO 99M ALBUMIN AGGREGATED
4.0000 | Freq: Once | INTRAVENOUS | Status: AC | PRN
  Administered 2018-05-23: 4.4 via INTRAVENOUS

## 2018-05-23 MED ORDER — DOXYCYCLINE HYCLATE 100 MG PO CAPS: 100.0000 mg | ORAL_CAPSULE | Freq: Two times a day (BID) | ORAL | 0 refills | Status: DC

## 2018-05-23 MED ORDER — TECHNETIUM TC 99M DIETHYLENETRIAME-PENTAACETIC ACID
30.0000 | Freq: Once | INTRAVENOUS | Status: AC | PRN
  Administered 2018-05-23: 32 via RESPIRATORY_TRACT

## 2018-05-23 MED ORDER — ACYCLOVIR 800 MG PO TABS
400.0000 mg | ORAL_TABLET | Freq: Three times a day (TID) | ORAL | Status: DC
  Administered 2018-05-23: 400 mg via ORAL
  Filled 2018-05-23: qty 1

## 2018-05-23 NOTE — Discharge Instructions (Signed)
Antibiotic Medicine, Adult Antibiotic medicines treat infections caused by a type of germ called bacteria. They work by killing the bacteria that make you sick. When do I need to take antibiotics? You often need these medicines to treat bacterial infections, such as:  A urinary tract infection (UTI).  Strep throat.  Meningitis. This affects the spinal cord and brain.  A bad lung infection.  You may start the medicines while your doctor waits for tests to come back. When the tests come back, your doctor may change or stop your medicine. When are antibiotics not needed? You do not need these medicines for most common illnesses, such as:  A cold.  The flu.  A sore throat.  Antibiotics are not always needed for all infections caused by bacteria. Do not ask for these medicines, or take them, when they are not needed. What are the risks of taking antibiotics? Most antibiotics can cause an infection called Clostridium difficile.This causes watery poop (diarrhea). Let your doctor know right away if:  You have watery poop while taking an antibiotic.  You have watery poop after you stop taking an antibiotic. The illness can happen weeks after you stop the medicine.  You also have a risk of getting an infection in the future that antibiotics cannot treat (antibiotic-resistant infection). This type of infection can be dangerous. What else should I know about taking antibiotics?  You need to take the entire prescription. ? Take the medicine for as long as told by your doctor. ? Do not stop taking it even if you start to feel better.  Try not to miss any doses. If you miss a dose, call your doctor.  Birth control pills may not work. If you take birth control pills: ? Keep on taking them. ? Use a second form of birth control, such as a condom. Do this for as long as told by your doctor.  Ask your doctor: ? How long to wait in between doses. ? If you should take the medicine with  food. ? If there is anything you should stay away from while taking the antibiotic, such as: ? Food. ? Drinks. ? Medicines. ? If there are any side effects you should watch for.  Only take the medicines that your doctor told you to take. Do not take medicines that were given to someone else.  Drink a large glass of water with the medicine.  Ask the pharmacist for a tool to measure the medicine, such as: ? A syringe. ? A cup. ? A spoon.  Throw away any extra medicine. Contact a doctor if:  You get worse.  You have new joint pain or muscle aches after starting the medicine.  You have side effects from the medicine, such as: ? Stomach pain. ? Watery poop. ? Feeling sick to your stomach (nausea). Get help right away if:  You have signs of a very bad allergic reaction. If this happens, stop taking the medicine right away. Signs may include: ? Hives. These are raised, itchy, red bumps on the skin. ? Skin rash. ? Trouble breathing. ? Wheezing. ? Swelling. ? Feeling dizzy. ? Throwing up (vomiting).  Your pee (urine) is dark, or is the color of blood.  Your skin turns yellow.  You bruise easily.  You bleed easily.  You have very bad watery poop and cramps in your belly.  You have a very bad headache. Summary  Antibiotics are often used to treat infections caused by bacteria.  Only take these  medicines when needed.  Let your doctor know if you have watery poop while taking an antibiotic.  You need to take the entire prescription. This information is not intended to replace advice given to you by your health care provider. Make sure you discuss any questions you have with your health care provider. Document Released: 05/15/2008 Document Revised: 08/08/2016 Document Reviewed: 08/08/2016 Elsevier Interactive Patient Education  2017 [place] Pneumonia, Adult Pneumonia is an infection of the lungs. There are different types of pneumonia.  One type can develop while a person is in a hospital. A different type, called community-acquired pneumonia, develops in people who are not, or have not recently been, in the hospital or other health care facility. What are the causes? Pneumonia may be caused by bacteria, viruses, or funguses. Community-acquired pneumonia is often caused by Streptococcus pneumonia bacteria. These bacteria are often passed from one person to another by breathing in droplets from the cough or sneeze of an infected person. What increases the risk? The condition is more likely to develop in:  People who havechronic diseases, such as chronic obstructive pulmonary disease (COPD), asthma, congestive heart failure, cystic fibrosis, diabetes, or kidney disease.  People who haveearly-stage or late-stage HIV.  People who havesickle cell disease.  People who havehad their spleen removed (splenectomy).  People who havepoor Human resources officer.  People who havemedical conditions that increase the risk of breathing in (aspirating) secretions their own mouth and nose.  People who havea weakened immune system (immunocompromised).  People who smoke.  People whotravel to areas where pneumonia-causing germs commonly exist.  People whoare around animal habitats or animals that have pneumonia-causing germs, including birds, bats, rabbits, cats, and farm animals.  What are the signs or symptoms? Symptoms of this condition include:  Adry cough.  A wet (productive) cough.  Fever.  Sweating.  Chest pain, especially when breathing deeply or coughing.  Rapid breathing or difficulty breathing.  Shortness of breath.  Shaking chills.  Fatigue.  Muscle aches.  How is this diagnosed? Your health care provider will take a medical history and perform a physical exam. You may also have other tests, including:  Imaging studies of your chest, including X-rays.  Tests to check your blood oxygen level and other  blood gases.  Other tests on blood, mucus (sputum), fluid around your lungs (pleural fluid), and urine.  If your pneumonia is severe, other tests may be done to identify the specific cause of your illness. How is this treated? The type of treatment that you receive depends on many factors, such as the cause of your pneumonia, the medicines you take, and other medical conditions that you have. For most adults, treatment and recovery from pneumonia may occur at home. In some cases, treatment must happen in a hospital. Treatment may include:  Antibiotic medicines, if the pneumonia was caused by bacteria.  Antiviral medicines, if the pneumonia was caused by a virus.  Medicines that are given by mouth or through an IV tube.  Oxygen.  Respiratory therapy.  Although rare, treating severe pneumonia may include:  Mechanical ventilation. This is done if you are not breathing well on your own and you cannot maintain a safe blood oxygen level.  Thoracentesis. This procedureremoves fluid around one lung or both lungs to help you breathe better.  Follow these instructions at home:  Take over-the-counter and prescription medicines only as told by your health care provider. ? Only takecough medicine if you are losing sleep. Understand  that cough medicine can prevent your bodys natural ability to remove mucus from your lungs. ? If you were prescribed an antibiotic medicine, take it as told by your health care provider. Do not stop taking the antibiotic even if you start to feel better.  Sleep in a semi-upright position at night. Try sleeping in a reclining chair, or place a few pillows under your head.  Do not use tobacco products, including cigarettes, chewing tobacco, and e-cigarettes. If you need help quitting, ask your health care provider.  Drink enough water to keep your urine clear or pale yellow. This will help to thin out mucus secretions in your lungs. How is this prevented? There are  ways that you can decrease your risk of developing community-acquired pneumonia. Consider getting a pneumococcal vaccine if:  You are older than 70 years of age.  You are older than 70 years of age and are undergoing cancer treatment, have chronic lung disease, or have other medical conditions that affect your immune system. Ask your health care provider if this applies to you.  There are different types and schedules of pneumococcal vaccines. Ask your health care provider which vaccination option is best for you. You may also prevent community-acquired pneumonia if you take these actions:  Get an influenza vaccine every year. Ask your health care provider which type of influenza vaccine is best for you.  Go to the dentist on a regular basis.  Wash your hands often. Use hand sanitizer if soap and water are not available.  Contact a health care provider if:  You have a fever.  You are losing sleep because you cannot control your cough with cough medicine. Get help right away if:  You have worsening shortness of breath.  You have increased chest pain.  Your sickness becomes worse, especially if you are an older adult or have a weakened immune system.  You cough up blood. This information is not intended to replace advice given to you by your health care provider. Make sure you discuss any questions you have with your health care provider. Document Released: 08/06/2005 Document Revised: 12/15/2015 Document Reviewed: 12/01/2014 Elsevier Interactive Patient Education  [employer].

## 2018-05-23 NOTE — Care Management Note (Signed)
Case Management Note  Patient Details  Name: EBONYE READE MRN: [id number] Date of Birth: [birth year]  Subjective/Objective:   Admitted with fever s/p lap appendectomy. Pt has lives with son, ind pta. Has insurance and providers to f/u with at DC. Pt ind with ADL's. Pt undergoing treatment for mult myeloma. 2 admissions, 4 ED visits in past 6 months, pt at high risk for re-hospitalization.      Action/Plan: DC home with self care at DC.   Expected Discharge Date:                  Expected Discharge Plan:  Home/Self Care  In-House Referral:  NA  Discharge planning Services  CM Consult  Post Acute Care Choice:  NA Choice offered to:  NA  Status of Service:  Completed, signed off   Sherald Barge, RN 05/23/2018, 11:22 AM

## 2018-05-23 NOTE — Evaluation (Signed)
Physical Therapy Evaluation Patient Details Name: ONNIE ALATORRE MRN: [id number] DOB: 04-Jan-1946 Today's Date: 05/23/2018   History of Present Illness  CORNELIOUS DIVEN is a 72 y.o. female who is currently being treated for multiple myeloma on chemotherapy and is postop day #2 status post laparoscopic appendectomy at this facility who presented to the emergency department complaining of fever and muscle aches in the arms.  The patient says that she did have a fever of 100.5 F at home prior to arrival.  She also reports that she has been having some was incisional abdominal pain.  She denies chills.  She does report that she had some mild pain with urination.  She denies shortness of breath and coughing.  She denies chest pain.  She also reports that she has not had a bowel movement in several days.  She was noted in the ED to have a rectal temperature of 101F.  Her oncologist was consulted and he has recommended that the patient be admitted for observation and broad-spectrum antibiotics and supportive care.     Clinical Impression  Patient functioning near baseline for functional mobility and gait.  Plan:  Patient discharged from physical therapy to care of nursing for ambulation daily as tolerated for length of stay.    Follow Up Recommendations No PT follow up    Equipment Recommendations  None recommended by PT    Recommendations for Other Services       Precautions / Restrictions Precautions Precautions: None Restrictions Weight Bearing Restrictions: No      Mobility  Bed Mobility Overal bed mobility: Independent                Transfers Overall transfer level: Independent                  Ambulation/Gait Ambulation/Gait assistance: Modified independent (Device/Increase time) Gait Distance (Feet): 100 Feet Assistive device: None Gait Pattern/deviations: WFL(Within Functional Limits) Gait velocity: decreased   General Gait Details: grossly WFL except  slower than normal cadence without loss of balance  Stairs            Wheelchair Mobility    Modified Rankin (Stroke Patients Only)       Balance Overall balance assessment: No apparent balance deficits (not formally assessed)                                           Pertinent Vitals/Pain Pain Assessment: No/denies pain    Home Living Family/patient expects to be discharged to:: Private residence Living Arrangements: Children Available Help at Discharge: Family Type of Home: Mobile home Home Access: Ramped entrance     Home Layout: One level Home Equipment: Environmental consultant - 2 wheels;Cane - single point;Shower seat - built in      Prior Function Level of Independence: Independent         Comments: Hydrographic surveyor, 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, newspaper        Extremity/Trunk Assessment   Upper Extremity Assessment Upper Extremity Assessment: Overall WFL for tasks assessed    Lower Extremity Assessment Lower Extremity Assessment: Overall WFL for tasks assessed    Cervical / Trunk Assessment Cervical / Trunk Assessment: Normal  Communication   Communication: No difficulties  Cognition Arousal/Alertness: Awake/alert Behavior During Therapy: WFL for tasks assessed/performed Overall Cognitive Status: Within Functional Limits for tasks assessed  General Comments      Exercises     Assessment/Plan    PT Assessment Patent does not need any further PT services  PT Problem List         PT Treatment Interventions      PT Goals (Current goals can be found in the Care Plan section)  Acute Rehab PT Goals Patient Stated Goal: return home with family to assist PT Goal Formulation: With patient Time For Goal Achievement: 05/23/18 Potential to Achieve Goals: Good    Frequency     Barriers to discharge        Co-evaluation               AM-PAC PT "6 Clicks" Daily  Activity  Outcome Measure Difficulty turning over in bed (including adjusting bedclothes, sheets and blankets)?: None Difficulty moving from lying on back to sitting on the side of the bed? : None Difficulty sitting down on and standing up from a chair with arms (e.g., wheelchair, bedside commode, etc,.)?: None Help needed moving to and from a bed to chair (including a wheelchair)?: None Help needed walking in hospital room?: None Help needed climbing 3-5 steps with a railing? : A Little 6 Click Score: 23    End of Session   Activity Tolerance: Patient tolerated treatment well;Patient limited by fatigue Patient left: in bed;with call bell/phone within reach;with family/visitor present(seated at bedside) Nurse Communication: Mobility status PT Visit Diagnosis: Unsteadiness on feet (R26.81);Other abnormalities of gait and mobility (R26.89);Muscle weakness (generalized) (M62.81)    Time: 0312-8118 PT Time Calculation (min) (ACUTE ONLY): 27 min   Charges:   PT Evaluation $PT Eval Moderate Complexity: 1 Mod PT Treatments $Therapeutic Exercise: 23-37 mins        3:37 PM, 05/23/18 Lonell Grandchild, MPT Physical Therapist with [facility] 336 [phone] office [phone] mobile phone

## 2018-05-23 NOTE — Progress Notes (Addendum)
05/23/2018 2:42 PM  Pt adamant that she wants to go home and does not want to spend another night in hospital.  I spoke with her and she was adamant to go home.  Will place discharge orders and I advised her to follow up with her PCP/oncologist as soon as possible. She verbalized understanding.  No recent vitals are documented since 0500.    Murvin Natal MD

## 2018-05-23 NOTE — Discharge Summary (Signed)
Physician Discharge Summary  Alexandra Price VZD:[id number] DOB: 1945/11/29 DOA: 05/22/2018  Oncologist Dr. Redge Gainer  Admit date: 05/22/2018 Discharge date: 05/23/2018  Admitted From: Home  Disposition: Home   Recommendations for Outpatient Follow-up:  1. Follow up with Oncologist in 3-4 days  Discharge Condition: STABLE   CODE STATUS: FULL    Brief Hospitalization Summary: Please see all hospital notes, images, labs for full details of the hospitalization. HPI: Alexandra Price is a 72 y.o. female who is currently being treated for multiple myeloma on chemotherapy and is postop day #2 status post laparoscopic appendectomy at this facility who presented to the emergency department complaining of fever and muscle aches in the arms.  The patient says that she did have a fever of 100.5 F at home prior to arrival.  She also reports that she has been having some was incisional abdominal pain.  She denies chills.  She does report that she had some mild pain with urination.  She denies shortness of breath and coughing.  She denies chest pain.  She also reports that she has not had a bowel movement in several days.  She was noted in the ED to have a rectal temperature of 101F.  Her oncologist was consulted and he has recommended that the patient be admitted for observation and broad-spectrum antibiotics and supportive care.  Brief Admission Hx: Alexandra Price a 71 y.o.femalewho is currently being treated for multiple myeloma on chemotherapy and is postop day #3 status post laparoscopic appendectomy at this facility who presented to the emergency department complaining of fever and muscle aches in the arms.  Pt was found to have pneumonia.   Given recent hospitalization, cover for HCAP.   MDM/Assessment & Plan:   1. HCAP - Treated with broad spectrum antibiotics.  DC on doxycycline.  Pt not willing to stay in hospital any longer.  2. Multiple myeloma - Oncology team consulted in ED and agreed  to see patient in consultation.  3. Sinus tachycardia - normal lactic acid, do not feel patient is septic but will monitor closely. Given elevated D dimer and recent surgery will check for V/Q scan to rule out PE. Check Doppler to rule out DVT.  Both studies were negative for VTE.   4. Stage 4 CKD - creatinine is stable from recent discharge. Follow.  5. POD#3 s/p lap appendectomy - ED physician spoke with Dr. Arnoldo Morale - no need to repeat CT abdomen at this time. Wound healing well. Follow clinical course.  6. Constipation - laxatives ordered.Pt has a large BM after the SMOG enema was given.   DVT Prophylaxis:lovenox Code Status:Full Family Communication:bedside Disposition Plan:Pt adamant to go home, I counseled her and she decided not to stay in hospital any longer but will follow up outpatient with her providers asap.   Discharge Diagnoses:  Principal Problem:   Fever Active Problems:   Multiple myeloma not having achieved remission (HCC)   S/P laparoscopic appendectomy   Sinus tachycardia   CKD (chronic kidney disease)   CAP (community acquired pneumonia)    Discharge Instructions: Discharge Instructions    Call MD for:  difficulty breathing, headache or visual disturbances   Complete by:  As directed    Call MD for:  extreme fatigue   Complete by:  As directed    Call MD for:  persistant dizziness or light-headedness   Complete by:  As directed    Call MD for:  persistant nausea and vomiting   Complete by:  As directed    Call MD for:  severe uncontrolled pain   Complete by:  As directed    Increase activity slowly   Complete by:  As directed      Allergies as of 05/23/2018      Reactions   Morphine And Related Nausea And Vomiting      Medication List    STOP taking these medications   ciprofloxacin 500 MG tablet Commonly known as:  CIPRO   lactulose 10 GM/15ML solution Commonly known as:  CHRONULAC     TAKE these medications   acyclovir  400 MG tablet Commonly known as:  ZOVIRAX Take 1 tablet (400 mg total) by mouth 2 (two) times daily. What changed:  when to take this   allopurinol 300 MG tablet Commonly known as:  ZYLOPRIM Take 1 tablet (300 mg total) by mouth daily.   ALPRAZolam 0.25 MG tablet Commonly known as:  XANAX Take 1 tablet (0.25 mg total) by mouth 2 (two) times daily as needed for anxiety.   aspirin EC 81 MG tablet Take 81 mg by mouth daily.   CALCIUM 1000 + D PO Take 1,000 mg by mouth daily. Take one tablet daily until next visit with oncologist.   dexamethasone 4 MG tablet Commonly known as:  DECADRON Take 5 tablets (20 mg) on days 1, 2, 8, 9, 15 and 16 on the days of chemotherapy.  Take 10 tablets during the week of no chemotherapy.   doxycycline 100 MG capsule Commonly known as:  VIBRAMYCIN Take 1 capsule (100 mg total) by mouth 2 (two) times daily.   feeding supplement (ENSURE ENLIVE) Liqd Take 237 mLs by mouth 2 (two) times daily between meals for 14 days. Start taking on:  05/24/2018   HYDROcodone-acetaminophen 5-325 MG tablet Commonly known as:  NORCO/VICODIN Take 1 tablet by mouth 2 (two) times daily.   lenalidomide 10 MG capsule Commonly known as:  REVLIMID Take 1 capsule (10 mg total) by mouth daily.   lidocaine-prilocaine cream Commonly known as:  EMLA Apply to affected area once   lisinopril 10 MG tablet Commonly known as:  PRINIVIL,ZESTRIL Take 1 tablet (10 mg total) by mouth daily.   ondansetron 4 MG tablet Commonly known as:  ZOFRAN Take 1 tablet (4 mg total) by mouth every 8 (eight) hours as needed for nausea or vomiting.   pantoprazole 40 MG tablet Commonly known as:  PROTONIX Take 1 tablet (40 mg total) by mouth daily.   polyethylene glycol packet Commonly known as:  MIRALAX / GLYCOLAX Take 17 g by mouth daily.   prochlorperazine 10 MG tablet Commonly known as:  COMPAZINE Take 1 tablet (10 mg total) by mouth every 6 (six) hours as needed (Nausea or  vomiting).   promethazine 25 MG tablet Commonly known as:  PHENERGAN Take 1 tablet (25 mg total) by mouth every 6 (six) hours as needed.   temazepam 15 MG capsule Commonly known as:  RESTORIL Take 1 capsule (15 mg total) by mouth at bedtime as needed for sleep.   traMADol 50 MG tablet Commonly known as:  ULTRAM Take 1 tablet (50 mg total) by mouth every 6 (six) hours as needed.   trolamine salicylate 10 % cream Commonly known as:  ASPERCREME Apply 1 application topically daily as needed for muscle pain.      Follow-up Information    [name], MD. Schedule an appointment as soon as possible for a visit in 4 day(s).   Specialty:  Hematology Why:  Hospital Follow Up  Contact information: [name] [address] [phone]          Allergies  Allergen Reactions  . Morphine And Related Nausea And Vomiting   Allergies as of 05/23/2018      Reactions   Morphine And Related Nausea And Vomiting      Medication List    STOP taking these medications   ciprofloxacin 500 MG tablet Commonly known as:  CIPRO   lactulose 10 GM/15ML solution Commonly known as:  CHRONULAC     TAKE these medications   acyclovir 400 MG tablet Commonly known as:  ZOVIRAX Take 1 tablet (400 mg total) by mouth 2 (two) times daily. What changed:  when to take this   allopurinol 300 MG tablet Commonly known as:  ZYLOPRIM Take 1 tablet (300 mg total) by mouth daily.   ALPRAZolam 0.25 MG tablet Commonly known as:  XANAX Take 1 tablet (0.25 mg total) by mouth 2 (two) times daily as needed for anxiety.   aspirin EC 81 MG tablet Take 81 mg by mouth daily.   CALCIUM 1000 + D PO Take 1,000 mg by mouth daily. Take one tablet daily until next visit with oncologist.   dexamethasone 4 MG tablet Commonly known as:  DECADRON Take 5 tablets (20 mg) on days 1, 2, 8, 9, 15 and 16 on the days of chemotherapy.  Take 10 tablets during the week of no chemotherapy.   doxycycline  100 MG capsule Commonly known as:  VIBRAMYCIN Take 1 capsule (100 mg total) by mouth 2 (two) times daily.   feeding supplement (ENSURE ENLIVE) Liqd Take 237 mLs by mouth 2 (two) times daily between meals for 14 days. Start taking on:  05/24/2018   HYDROcodone-acetaminophen 5-325 MG tablet Commonly known as:  NORCO/VICODIN Take 1 tablet by mouth 2 (two) times daily.   lenalidomide 10 MG capsule Commonly known as:  REVLIMID Take 1 capsule (10 mg total) by mouth daily.   lidocaine-prilocaine cream Commonly known as:  EMLA Apply to affected area once   lisinopril 10 MG tablet Commonly known as:  PRINIVIL,ZESTRIL Take 1 tablet (10 mg total) by mouth daily.   ondansetron 4 MG tablet Commonly known as:  ZOFRAN Take 1 tablet (4 mg total) by mouth every 8 (eight) hours as needed for nausea or vomiting.   pantoprazole 40 MG tablet Commonly known as:  PROTONIX Take 1 tablet (40 mg total) by mouth daily.   polyethylene glycol packet Commonly known as:  MIRALAX / GLYCOLAX Take 17 g by mouth daily.   prochlorperazine 10 MG tablet Commonly known as:  COMPAZINE Take 1 tablet (10 mg total) by mouth every 6 (six) hours as needed (Nausea or vomiting).   promethazine 25 MG tablet Commonly known as:  PHENERGAN Take 1 tablet (25 mg total) by mouth every 6 (six) hours as needed.   temazepam 15 MG capsule Commonly known as:  RESTORIL Take 1 capsule (15 mg total) by mouth at bedtime as needed for sleep.   traMADol 50 MG tablet Commonly known as:  ULTRAM Take 1 tablet (50 mg total) by mouth every 6 (six) hours as needed.   trolamine salicylate 10 % cream Commonly known as:  ASPERCREME Apply 1 application topically daily as needed for muscle pain.       Procedures/Studies: Ct Abdomen Pelvis Wo Contrast  Result Date: 05/20/2018 CLINICAL DATA:  Right lower quadrant pain for 4 days, constipation, some nausea, history of multiple myeloma EXAM:  CT ABDOMEN AND PELVIS WITHOUT CONTRAST  TECHNIQUE: Multidetector CT imaging of the abdomen and pelvis was performed following the standard protocol without IV contrast. COMPARISON:  CT abdomen pelvis of 05/11/2017 FINDINGS: Lower chest: Linear scarring or atelectasis is again noted medially at the left lung base. No pneumonia or pleural effusion is seen. Heart size is stable. Hepatobiliary: No focal liver abnormality is seen. No gallstones, gallbladder wall thickening, or biliary dilatation. The liver is unremarkable in the unenhanced state. No calcified gallstones are seen. Pancreas: The pancreas is normal in size and the pancreatic duct is not dilated. Spleen: The spleen is unremarkable. There is a small splenic artery calcification near the splenic hilus. Adrenals/Urinary Tract: The adrenal glands appear normal. No renal calculi are seen and there is no evidence of hydronephrosis. The ureters are normal in caliber. The urinary bladder is not well distended but no abnormality is seen. Stomach/Bowel: The stomach is largely decompressed. No small bowel abnormality is seen. No abnormality of the colon is seen other than possible edema at the base of the cecum. The appendix appears thickened and fluid filled extending caudally and anteriorly toward the right inguinal canal region. The appendix measures up to 13 mm in diameter. There is some strandiness of the surrounding soft tissue planes but no evidence of rupture or abscess formation is seen. Vascular/Lymphatic: Abdominal aorta is normal in caliber with mild abdominal aortic atherosclerosis for age. No adenopathy is seen with only small retroperitoneal nodes and mesenteric nodes present. Reproductive: The uterus has previously been resected. No adnexal lesion is seen. A tiny amount of fluid layers in the pelvis. Other: No abdominal wall hernia is seen Musculoskeletal: Diffuse bone lesions are present in this patient with known multiple myeloma with partial compression of anterior superior aspect of L4  and T11 again noted. IMPRESSION: 1. Dilated edematous and somewhat rigid appendix measuring up to 13 mm in diameter with some surrounding strandiness of the fat planes indicative of acute appendicitis. No complicating features are seen. 2. Diffuse bone lesions consistent with known multiple myeloma. Electronically Signed   By: Ivar Drape M.D.   On: 05/20/2018 13:17   Ct Abdomen Pelvis Wo Contrast  Result Date: 05/11/2018 CLINICAL DATA:  72 year old female with abdominal pain. History of multiple myeloma. EXAM: CT ABDOMEN AND PELVIS WITHOUT CONTRAST TECHNIQUE: Multidetector CT imaging of the abdomen and pelvis was performed following the standard protocol without IV contrast. COMPARISON:  Skeletal survey radiograph dated 04/07/2018 FINDINGS: Evaluation of this exam is limited in the absence of intravenous contrast. Lower chest: Left lung base linear atelectasis/scarring. The visualized lung bases are otherwise clear. There is hypoattenuation of the cardiac blood pool suggestive of a degree of anemia. Clinical correlation is recommended. The tip of Port-A-Cath is partially visualized in the region of the cavoatrial junction. No intra-abdominal free air or free fluid. Hepatobiliary: No focal liver abnormality is seen. No gallstones, gallbladder wall thickening, or biliary dilatation. Pancreas: Punctate focus of calcification in the body of the pancreas may be related to vascular calcification or sequela of prior inflammation. The pancreas is otherwise unremarkable. Spleen: Normal in size without focal abnormality. Adrenals/Urinary Tract: Adrenal glands are unremarkable. Kidneys are normal, without renal calculi, focal lesion, or hydronephrosis. Bladder is unremarkable. Stomach/Bowel: There is loose stool within the colon compatible with diarrheal state. There is no bowel obstruction or active inflammation. Normal appendix. Vascular/Lymphatic: Mild aortoiliac atherosclerotic disease. The abdominal aorta and IVC are  otherwise grossly unremarkable on this noncontrast CT. No portal venous gas.  There is no adenopathy. Reproductive: Hysterectomy.  No pelvic mass. Other: None Musculoskeletal: Osteopenia. Innumerable lytic osseous lesions consistent with known multiple myeloma. There is compression fracture of the superior endplate of L4 with approximately 40% loss of vertebral body height centrally similar or progressed since the prior radiograph. There is compression fracture of the T11 with approximately 50% loss of vertebral body height and mild anterior wedging similar to prior radiograph. There are multiple old appearing left posterior rib fractures. No definite acute fracture. Evaluation for acute fractures however limited due to advanced osteopenia and extensive lytic lucencies. IMPRESSION: 1. Diarrheal state. Correlation with clinical exam and stool cultures recommended. No bowel obstruction. Normal appendix. 2. Innumerable osseous lytic lesions in keeping with known multiple myeloma. Known T11 and L4 compression fractures as well as multiple old-appearing left posterior rib fractures. No definite acute fracture. Electronically Signed   By: Anner Crete M.D.   On: 05/11/2018 23:00   Dg Chest 1 View  Result Date: 05/22/2018 CLINICAL DATA:  Abdominal pain and fever in a patient with a history of multiple myeloma. EXAM: CHEST  1 VIEW COMPARISON:  PA and lateral chest 05/20/2018 and 05/11/2018. FINDINGS: Lung volumes are low but the lungs are clear. Heart size is normal. No pneumothorax or pleural effusion. Port-A-Cath is in place. Multiple lytic bony lesions consistent with multiple myeloma noted. IMPRESSION: No acute disease. Lytic bone lesions consistent with multiple myeloma. Electronically Signed   By: Inge Rise M.D.   On: 05/22/2018 11:17   Dg Chest 2 View  Result Date: 05/20/2018 CLINICAL DATA:  Cough, dizziness, and chest pain. History of multiple myeloma. Nonsmoker. EXAM: CHEST - 2 VIEW COMPARISON:   PA and lateral chest x-ray of May 11, 2018 FINDINGS: The lungs are adequately inflated. The interstitial markings are coarse though stable. Confluent density projecting over the posterior aspect of the right seventh rib is consistent with a myomatous rib lesion and is stable. The heart and pulmonary vascularity are normal. There is faint calcification in the wall of the aortic arch. The power port catheter tip projects over the midportion of the SVC. There is chronic partial compression of the body of T11 or T12. There are innumerable permeative lesions within the humeri, clavicles, and scapulae. There is deformity of the lateral aspect of the right fourth rib consistent with a previous fracture. IMPRESSION: Chronic bronchitic changes. No alveolar pneumonia nor pulmonary edema. Extensive myomatous involvement of the visualized bony structures. Thoracic aortic atherosclerosis. Electronically Signed   By: David  Martinique M.D.   On: 05/20/2018 11:36   Dg Chest 2 View  Result Date: 05/11/2018 CLINICAL DATA:  Abdominal pain EXAM: CHEST - 2 VIEW COMPARISON:  04/27/2018 FINDINGS: Right Port-A-Cath remains in place, unchanged. Heart is normal size. No confluent airspace opacities or effusions. Numerous lucencies throughout the visualized skeletal structures compatible with diffuse widespread myeloma. IMPRESSION: No acute cardiopulmonary disease. Electronically Signed   By: Rolm Baptise M.D.   On: 05/11/2018 22:57   Dg Ribs Unilateral W/chest Right  Result Date: 04/27/2018 CLINICAL DATA:  Right rib pain.  Myeloma.  Weakness. EXAM: RIGHT RIBS AND CHEST - 3+ VIEW COMPARISON:  04/14/2018 FINDINGS: Extensive lytic lesions throughout the skeleton compatible with diffuse myeloma. Expansion in lytic destruction of the left distal clavicle is especially pronounced. Underlying bony demineralization. There is some right anterior rib deformities compatible with age-indeterminate fractures. For example, there is apparent  discontinuity of the right anterior fifth rib on 1 of the projections. Moderate compression fracture at T11.  There also some left upper rib deformities compatible with age-indeterminate fractures. Mild enlargement of the cardiopericardial silhouette. Upper zone pulmonary vascular prominence. IMPRESSION: 1. Age indeterminate bilateral rib fractures. 2. Extensive widespread myeloma throughout the skeleton with associated lytic lesions. Lytic destruction of the left distal clavicle. 3. Cardiomegaly and pulmonary venous hypertension. Electronically Signed   By: Van Clines M.D.   On: 04/27/2018 15:26   Nm Pulmonary Perf And Vent  Result Date: 05/23/2018 CLINICAL DATA:  Three days post appendectomy, chest pain, high pretest clinical probability for pulmonary embolism EXAM: NUCLEAR MEDICINE VENTILATION - PERFUSION LUNG SCAN TECHNIQUE: Ventilation images were obtained in multiple projections using inhaled aerosol [id number]. Perfusion images were obtained in multiple projections after intravenous injection of Tc-984mAA. RADIOPHARMACEUTICALS:  32 mCi of Tc-9930mPA aerosol inhalation and 4.4 mCi Tc99m78m IV COMPARISON:  None Correlation: Chest radiograph 05/23/2018 FINDINGS: Ventilation: Normal Perfusion: Diminished perfusion at the posterior lung bases bilaterally. Observed pattern suggests presence of small bibasilar pleural effusions or minimal atelectasis. Portable chest radiograph demonstrates subsegmental atelectasis at LEFT base but no definite pleural effusions. No other perfusion defects identified. IMPRESSION: Low probability for pulmonary embolism. Electronically Signed   By: MarkLavonia Dana.   On: 05/23/2018 12:26   Us VKoreaous Img Lower Bilateral  Result Date: 05/23/2018 CLINICAL DATA:  Bilateral lower extremity pain and edema. Elevated D-dimer. History of multiple myeloma. Evaluate for DVT EXAM: BILATERAL LOWER EXTREMITY VENOUS DOPPLER ULTRASOUND TECHNIQUE: Gray-scale sonography with graded  compression, as well as color Doppler and duplex ultrasound were performed to evaluate the lower extremity deep venous systems from the level of the common femoral vein and including the common femoral, femoral, profunda femoral, popliteal and calf veins including the posterior tibial, peroneal and gastrocnemius veins when visible. The superficial great saphenous vein was also interrogated. Spectral Doppler was utilized to evaluate flow at rest and with distal augmentation maneuvers in the common femoral, femoral and popliteal veins. COMPARISON:  None. FINDINGS: RIGHT LOWER EXTREMITY Common Femoral Vein: No evidence of thrombus. Normal compressibility, respiratory phasicity and response to augmentation. Saphenofemoral Junction: No evidence of thrombus. Normal compressibility and flow on color Doppler imaging. Profunda Femoral Vein: No evidence of thrombus. Normal compressibility and flow on color Doppler imaging. Femoral Vein: No evidence of thrombus. Normal compressibility, respiratory phasicity and response to augmentation. Popliteal Vein: No evidence of thrombus. Normal compressibility, respiratory phasicity and response to augmentation. Calf Veins: No evidence of thrombus. Normal compressibility and flow on color Doppler imaging. Superficial Great Saphenous Vein: No evidence of thrombus. Normal compressibility. Venous Reflux:  None. Other Findings:  None. LEFT LOWER EXTREMITY Common Femoral Vein: No evidence of thrombus. Normal compressibility, respiratory phasicity and response to augmentation. Saphenofemoral Junction: No evidence of thrombus. Normal compressibility and flow on color Doppler imaging. Profunda Femoral Vein: No evidence of thrombus. Normal compressibility and flow on color Doppler imaging. Femoral Vein: No evidence of thrombus. Normal compressibility, respiratory phasicity and response to augmentation. Popliteal Vein: No evidence of thrombus. Normal compressibility, respiratory phasicity and  response to augmentation. Calf Veins: No evidence of thrombus. Normal compressibility and flow on color Doppler imaging. Superficial Great Saphenous Vein: No evidence of thrombus. Normal compressibility. Venous Reflux:  None. Other Findings:  None. IMPRESSION: No evidence of DVT within either lower extremity. Electronically Signed   By: JohnSandi Mariscal.   On: 05/23/2018 12:43   Us VKoreaous Img Upper Uni Left  Result Date: 04/28/2018 CLINICAL DATA:  Left upper extremity edema. EXAM: LEFT UPPER EXTREMITY VENOUS DOPPLER ULTRASOUND  TECHNIQUE: Gray-scale sonography with graded compression, as well as color Doppler and duplex ultrasound were performed to evaluate the upper extremity deep venous system from the level of the subclavian vein and including the jugular, axillary, basilic, radial, ulnar and upper cephalic vein. Spectral Doppler was utilized to evaluate flow at rest and with distal augmentation maneuvers. COMPARISON:  None. FINDINGS: Contralateral Subclavian Vein: Respiratory phasicity is normal and symmetric with the symptomatic side. No evidence of thrombus. Normal compressibility. Internal Jugular Vein: No evidence of thrombus. Normal compressibility, respiratory phasicity and response to augmentation. Subclavian Vein: No evidence of thrombus. Normal compressibility, respiratory phasicity and response to augmentation. Axillary Vein: No evidence of thrombus. Normal compressibility, respiratory phasicity and response to augmentation. Cephalic Vein: No evidence of thrombus. Normal compressibility, respiratory phasicity and response to augmentation. Basilic Vein: No evidence of thrombus. Normal compressibility, respiratory phasicity and response to augmentation. Brachial Veins: No evidence of thrombus. Normal compressibility, respiratory phasicity and response to augmentation. Radial Veins: No evidence of thrombus. Normal compressibility, respiratory phasicity and response to augmentation. Ulnar Veins: No  evidence of thrombus. Normal compressibility, respiratory phasicity and response to augmentation. Venous Reflux:  None visualized. Other Findings: No evidence of superficial thrombophlebitis or abnormal fluid collection. IMPRESSION: No evidence of DVT within the left upper extremity. Electronically Signed   By: Aletta Edouard M.D.   On: 04/28/2018 16:04   Dg Chest Port 1 View  Result Date: 05/23/2018 CLINICAL DATA:  Multiple myeloma.  Unexplained fever. EXAM: PORTABLE CHEST 1 VIEW COMPARISON:  05/22/2018.  05/20/2018.  05/11/2018. FINDINGS: Power port remains in place. Heart size is normal. Chronic interstitial lung markings are again evident. There is mild patchy atelectasis and or pneumonia at the left lung base. The remainder the chest is clear. Multiple lytic skeletal lesions again demonstrated. IMPRESSION: New small area of atelectasis or pneumonia in the left lower lobe. Chronic interstitial lung markings elsewhere. Chronic lytic bone lesions. Electronically Signed   By: Nelson Chimes M.D.   On: 05/23/2018 07:37     Subjective: Pt says that she wants to go home and not willing to stay another night in the hospital. She says she feels much better.    Discharge Exam: Vitals:   05/23/18 0929 05/23/18 1441  BP: 130/72 136/76  Pulse:  (!) 108  Resp:  18  Temp:  99.5 F (37.5 C)  SpO2:  98%   Vitals:   05/22/18 2115 05/23/18 0521 05/23/18 0929 05/23/18 1441  BP: (!) 148/75 (!) 154/79 130/72 136/76  Pulse: (!) 105 (!) 123  (!) 108  Resp: 18 19  18   Temp: 99.6 F (37.6 C) 99.6 F (37.6 C)  99.5 F (37.5 C)  TempSrc: Oral Oral  Oral  SpO2: 96% 95%  98%  Weight:      Height:       General: Pt is alert, awake, not in acute distress Cardiovascular: normal S1/S2 +, no rubs, no gallops Respiratory:  no wheezing, no rhonchi Abdominal: Soft, NT, ND, bowel sounds + Extremities: no edema, no cyanosis   The results of significant diagnostics from this hospitalization (including imaging,  microbiology, ancillary and laboratory) are listed below for reference.     Microbiology: Recent Results (from the past 240 hour(s))  Urine culture     Status: Abnormal   Collection Time: 05/20/18 10:40 AM  Result Value Ref Range Status   Specimen Description   Final    URINE, CLEAN CATCH Performed at [facility], [address], [place],  [address]  Special Requests   Final    Normal Performed at [facility], [address] [place], [place] [address]    Culture >=100,000 COLONIES/mL PSEUDOMONAS AERUGINOSA (A)  Final   Report Status 05/23/2018 FINAL  Final   Organism ID, Bacteria PSEUDOMONAS AERUGINOSA (A)  Final      Susceptibility   Pseudomonas aeruginosa - MIC*    CEFTAZIDIME 4 SENSITIVE Sensitive     CIPROFLOXACIN <=0.25 SENSITIVE Sensitive     GENTAMICIN <=1 SENSITIVE Sensitive     IMIPENEM 1 SENSITIVE Sensitive     PIP/TAZO 8 SENSITIVE Sensitive     CEFEPIME 2 SENSITIVE Sensitive     * >=100,000 COLONIES/mL PSEUDOMONAS AERUGINOSA  Blood Culture (routine x 2)     Status: None (Preliminary result)   Collection Time: 05/20/18 10:58 AM  Result Value Ref Range Status   Specimen Description BLOOD LEFT HAND  Final   Special Requests   Final    BOTTLES DRAWN AEROBIC AND ANAEROBIC Blood Culture adequate volume   Culture   Final    NO GROWTH 3 DAYS Performed at [facility], [address] [place], [place] [address]    Report Status PENDING  Incomplete  Blood Culture (routine x 2)     Status: None (Preliminary result)   Collection Time: 05/20/18 11:06 AM  Result Value Ref Range Status   Specimen Description BLOOD LEFT HAND  Final   Special Requests   Final    BOTTLES DRAWN AEROBIC AND ANAEROBIC Blood Culture adequate volume   Culture   Final    NO GROWTH 3 DAYS Performed at [facility], [address], [place], [place] [address]    Report Status PENDING  Incomplete  MRSA PCR Screening     Status: Abnormal   Collection Time: 05/20/18  5:31 PM  Result Value Ref  Range Status   MRSA by PCR POSITIVE (A) NEGATIVE Final    Comment:        The GeneXpert MRSA Assay (FDA approved for NASAL specimens only), is one component of a comprehensive MRSA colonization surveillance program. It is not intended to diagnose MRSA infection nor to guide or monitor treatment for MRSA infections. RESULT CALLED TO, READ BACK BY AND VERIFIED WITH: AMBURN,A AT 0653 BY HUFFINES,S ON 05/21/18. Performed at [facility], [address], [place], [place] [address]   Culture, blood (Routine x 2)     Status: None (Preliminary result)   Collection Time: 05/22/18 10:12 AM  Result Value Ref Range Status   Specimen Description BLOOD LEFT HAND DRAWN BY RN  Final   Special Requests   Final    BOTTLES DRAWN AEROBIC AND ANAEROBIC Blood Culture results may not be optimal due to an excessive volume of blood received in culture bottles   Culture   Final    NO GROWTH < 24 HOURS Performed at [facility] [address] [place], [place] [address]    Report Status PENDING  Incomplete  Culture, blood (Routine x 2)     Status: None (Preliminary result)   Collection Time: 05/22/18 10:12 AM  Result Value Ref Range Status   Specimen Description BLOOD RIGHT HAND  Final   Special Requests   Final    BOTTLES DRAWN AEROBIC AND ANAEROBIC Blood Culture adequate volume   Culture   Final    NO GROWTH < 24 HOURS Performed at [facility], [address], [place], [place] [address]    Report Status PENDING  Incomplete     Labs: BNP (  last 3 results) No results for input(s): BNP in the last 8760 hours. Basic Metabolic Panel: Recent Labs  Lab 05/20/18 1058 05/21/18 0356 05/22/18 1012 05/23/18 0511  NA 130* 132* 132* 137  K 4.5 4.6 4.0 4.3  CL 96* 100 99 105  CO2 24 20* 22 23  GLUCOSE 158* 340* 149* 165*  BUN 25* 26* 23 21  CREATININE 2.13* 2.32* 2.28* 2.20*  CALCIUM 7.6* 6.7* 6.7* 6.5*   Liver Function Tests: Recent Labs  Lab 05/20/18 1058 05/22/18 1012 05/23/18 0511  AST 18  22 25   ALT 21 38 48*  ALKPHOS 150* 122 128*  BILITOT 0.5 0.5 0.6  PROT 7.8 6.9 6.3*  ALBUMIN 3.0* 2.6* 2.5*   Recent Labs  Lab 05/22/18 1036  LIPASE 25   No results for input(s): AMMONIA in the last 168 hours. CBC: Recent Labs  Lab 05/20/18 1058 05/21/18 0356 05/22/18 1012 05/23/18 0511  WBC 14.8* 15.0* 12.7* 13.6*  NEUTROABS 13.7  --  10.8 11.1*  HGB 9.0* 7.9* 8.6* 8.6*  HCT 27.7* 24.2* 26.0* 26.7*  MCV 93.6 94.2 92.9 94.7  PLT 170 147* 133* 125*   Cardiac Enzymes: Recent Labs  Lab 05/22/18 1457 05/23/18 0511  CKTOTAL 18* 14*   BNP: Invalid input(s): POCBNP CBG: No results for input(s): GLUCAP in the last 168 hours. D-Dimer Recent Labs    05/23/18 0723  DDIMER 8.22*   Hgb A1c No results for input(s): HGBA1C in the last 72 hours. Lipid Profile No results for input(s): CHOL, HDL, LDLCALC, TRIG, CHOLHDL, LDLDIRECT in the last 72 hours. Thyroid function studies No results for input(s): TSH, T4TOTAL, T3FREE, THYROIDAB in the last 72 hours.  Invalid input(s): FREET3 Anemia work up No results for input(s): VITAMINB12, FOLATE, FERRITIN, TIBC, IRON, RETICCTPCT in the last 72 hours. Urinalysis    Component Value Date/Time   COLORURINE YELLOW 05/22/2018 1228   APPEARANCEUR CLEAR 05/22/2018 1228   LABSPEC 1.009 05/22/2018 1228   PHURINE 6.0 05/22/2018 1228   GLUCOSEU NEGATIVE 05/22/2018 1228   HGBUR NEGATIVE 05/22/2018 1228   BILIRUBINUR NEGATIVE 05/22/2018 [place] 05/22/2018 1228   PROTEINUR 30 (A) 05/22/2018 1228   NITRITE NEGATIVE 05/22/2018 1228   LEUKOCYTESUR NEGATIVE 05/22/2018 1228   Sepsis Labs Invalid input(s): PROCALCITONIN,  WBC,  LACTICIDVEN Microbiology Recent Results (from the past 240 hour(s))  Urine culture     Status: Abnormal   Collection Time: 05/20/18 10:40 AM  Result Value Ref Range Status   Specimen Description   Final    URINE, CLEAN CATCH Performed at [facility], [address] [place], [place] [address]     Special Requests   Final    Normal Performed at [facility], [address] [place], [place] [address]    Culture >=100,000 COLONIES/mL PSEUDOMONAS AERUGINOSA (A)  Final   Report Status 05/23/2018 FINAL  Final   Organism ID, Bacteria PSEUDOMONAS AERUGINOSA (A)  Final      Susceptibility   Pseudomonas aeruginosa - MIC*    CEFTAZIDIME 4 SENSITIVE Sensitive     CIPROFLOXACIN <=0.25 SENSITIVE Sensitive     GENTAMICIN <=1 SENSITIVE Sensitive     IMIPENEM 1 SENSITIVE Sensitive     PIP/TAZO 8 SENSITIVE Sensitive     CEFEPIME 2 SENSITIVE Sensitive     * >=100,000 COLONIES/mL PSEUDOMONAS AERUGINOSA  Blood Culture (routine x 2)     Status: None (Preliminary result)   Collection Time: 05/20/18 10:58 AM  Result Value Ref Range Status   Specimen Description  BLOOD LEFT HAND  Final   Special Requests   Final    BOTTLES DRAWN AEROBIC AND ANAEROBIC Blood Culture adequate volume   Culture   Final    NO GROWTH 3 DAYS Performed at [facility], [address], [place], [place] [address]    Report Status PENDING  Incomplete  Blood Culture (routine x 2)     Status: None (Preliminary result)   Collection Time: 05/20/18 11:06 AM  Result Value Ref Range Status   Specimen Description BLOOD LEFT HAND  Final   Special Requests   Final    BOTTLES DRAWN AEROBIC AND ANAEROBIC Blood Culture adequate volume   Culture   Final    NO GROWTH 3 DAYS Performed at [facility], [address], [place], [place] [address]    Report Status PENDING  Incomplete  MRSA PCR Screening     Status: Abnormal   Collection Time: 05/20/18  5:31 PM  Result Value Ref Range Status   MRSA by PCR POSITIVE (A) NEGATIVE Final    Comment:        The GeneXpert MRSA Assay (FDA approved for NASAL specimens only), is one component of a comprehensive MRSA colonization surveillance program. It is not intended to diagnose MRSA infection nor to guide or monitor treatment for MRSA infections. RESULT CALLED TO, READ BACK BY AND  VERIFIED WITH: AMBURN,A AT 0653 BY HUFFINES,S ON 05/21/18. Performed at [facility], [address] [place], [place] [address]   Culture, blood (Routine x 2)     Status: None (Preliminary result)   Collection Time: 05/22/18 10:12 AM  Result Value Ref Range Status   Specimen Description BLOOD LEFT HAND DRAWN BY RN  Final   Special Requests   Final    BOTTLES DRAWN AEROBIC AND ANAEROBIC Blood Culture results may not be optimal due to an excessive volume of blood received in culture bottles   Culture   Final    NO GROWTH < 24 HOURS Performed at [facility], [address], [place], [place] [address]    Report Status PENDING  Incomplete  Culture, blood (Routine x 2)     Status: None (Preliminary result)   Collection Time: 05/22/18 10:12 AM  Result Value Ref Range Status   Specimen Description BLOOD RIGHT HAND  Final   Special Requests   Final    BOTTLES DRAWN AEROBIC AND ANAEROBIC Blood Culture adequate volume   Culture   Final    NO GROWTH < 24 HOURS Performed at [facility], [address] [place],  [address]    Report Status PENDING  Incomplete   Time coordinating discharge:   SIGNED:  Irwin Brakeman, MD  [facility] 05/23/2018, 3:28 PM Pager [phone]  If 7PM-7AM, please contact night-coverage [email or web address] Password [email or web address]

## 2018-05-23 NOTE — Progress Notes (Addendum)
PROGRESS NOTE  [name]  LDJ:[id number]  DOB: March 20, 1946  DOA: 05/22/2018 PCP: Patient, No Pcp Per  Brief Admission Hx: Alexandra Price is a 72 y.o. female who is currently being treated for multiple myeloma on chemotherapy and is postop day #3 status post laparoscopic appendectomy at this facility who presented to the emergency department complaining of fever and muscle aches in the arms.  Pt was found to have pneumonia.   Given recent hospitalization, cover for HCAP.   MDM/Assessment & Plan:   1. HCAP - Continue broad spectrum antibiotics.  DC metronidazole.  2. Multiple myeloma - Oncology team consulted in ED and agreed to see patient in consultation.    3. Sinus tachycardia - normal lactic acid, do not feel patient is septic but will monitor closely.  Given elevated D dimer and recent surgery will check for V/Q scan to rule out PE. Check Doppler to rule out DVT.   4. Stage 4 CKD - creatinine is stable from recent discharge.  Follow.  5. POD#3 s/p lap appendectomy -  ED physician spoke with Dr. Arnoldo Morale - no need to repeat CT abdomen at this time.  Wound healing well.  Follow clinical course.   6. Constipation - laxatives ordered. Pt has a large BM after the SMOG enema was given.   DVT Prophylaxis: lovenox  Code Status: Full   Family Communication: bedside   Disposition Plan: home when stable  Antimicrobials:  Cefepime 10/3  Vancomycin 10/3  Metronidazole 10/3-10/4  Acyclovir ongoing   Subjective: Pt says that she is feeling a little better today.  She has less complaints about pain and discomfort today.  She denies chest pain and denies SOB and denies palpitations.    Objective: Vitals:   05/22/18 1453 05/22/18 2115 05/23/18 0521 05/23/18 0929  BP: 133/75 (!) 148/75 (!) 154/79 130/72  Pulse: (!) 105 (!) 105 (!) 123   Resp: _0 Temp: 99.5 F (37.5 C) 99.6 F (37.6 C) 99.6 F (37.6 C)   TempSrc: Oral Oral Oral   SpO2: 94% 96% 95%   Weight:        Height:        Intake/Output Summary (Last 24 hours) at 05/23/2018 1156 Last data filed at 05/23/2018 0538 Gross per 24 hour  Intake 676.17 ml  Output -  Net 676.17 ml   Filed Weights   05/22/18 1006  Weight: 72.1 kg   REVIEW OF SYSTEMS  As per history otherwise all reviewed and reported negative  Exam:  General exam: awake, alert, cooperative, NAD.  Respiratory system: No increased work of breathing. Cardiovascular system: S1 & S2 heard, tachycardic. . Gastrointestinal system: Abdomen is nondistended, soft and nontender. Normal bowel sounds heard. Central nervous system: Alert and oriented. No focal neurological deficits. Extremities: trace edema bilateral LEs.   Data Reviewed: Basic Metabolic Panel: Recent Labs  Lab 05/20/18 1058 05/21/18 0356 05/22/18 1012 05/23/18 0511  NA 130* 132* 132* 137  K 4.5 4.6 4.0 4.3  CL 96* 100 99 105  CO2 24 20* 22 23  GLUCOSE 158* 340* 149* 165*  BUN 25* 26* 23 21  CREATININE 2.13* 2.32* 2.28* 2.20*  CALCIUM 7.6* 6.7* 6.7* 6.5*   Liver Function Tests: Recent Labs  Lab 05/20/18 1058 05/22/18 1012 05/23/18 0511  AST _1 ALT 21 38 48*  ALKPHOS 150* 122 128*  BILITOT 0.5 0.5 0.6  PROT 7.8 6.9 6.3*  ALBUMIN 3.0* 2.6* 2.5*   Recent Labs  Lab 05/22/18 1036  LIPASE 25   No results for input(s): AMMONIA in the last 168 hours. CBC: Recent Labs  Lab 05/20/18 1058 05/21/18 0356 05/22/18 1012 05/23/18 0511  WBC 14.8* 15.0* 12.7* 13.6*  NEUTROABS 13.7  --  10.8 11.1*  HGB 9.0* 7.9* 8.6* 8.6*  HCT 27.7* 24.2* 26.0* 26.7*  MCV 93.6 94.2 92.9 94.7  PLT 170 147* 133* 125*   Cardiac Enzymes: Recent Labs  Lab 05/22/18 1457 05/23/18 0511  CKTOTAL 18* 14*   CBG (last 3)  No results for input(s): GLUCAP in the last 72 hours. Recent Results (from the past 240 hour(s))  Urine culture     Status: Abnormal   Collection Time: 05/20/18 10:40 AM  Result Value Ref Range Status   Specimen Description   Final    URINE,  CLEAN CATCH Performed at [facility], [address] [place], [place] [address]    Special Requests   Final    Normal Performed at [facility], [address] [place], [place] [address]    Culture >=100,000 COLONIES/mL PSEUDOMONAS AERUGINOSA (A)  Final   Report Status 05/23/2018 FINAL  Final   Organism ID, Bacteria PSEUDOMONAS AERUGINOSA (A)  Final      Susceptibility   Pseudomonas aeruginosa - MIC*    CEFTAZIDIME 4 SENSITIVE Sensitive     CIPROFLOXACIN <=0.25 SENSITIVE Sensitive     GENTAMICIN <=1 SENSITIVE Sensitive     IMIPENEM 1 SENSITIVE Sensitive     PIP/TAZO 8 SENSITIVE Sensitive     CEFEPIME 2 SENSITIVE Sensitive     * >=100,000 COLONIES/mL PSEUDOMONAS AERUGINOSA  Blood Culture (routine x 2)     Status: None (Preliminary result)   Collection Time: 05/20/18 10:58 AM  Result Value Ref Range Status   Specimen Description BLOOD LEFT HAND  Final   Special Requests   Final    BOTTLES DRAWN AEROBIC AND ANAEROBIC Blood Culture adequate volume   Culture   Final    NO GROWTH 3 DAYS Performed at [facility], [address] [place], [place] [address]    Report Status PENDING  Incomplete  Blood Culture (routine x 2)     Status: None (Preliminary result)   Collection Time: 05/20/18 11:06 AM  Result Value Ref Range Status   Specimen Description BLOOD LEFT HAND  Final   Special Requests   Final    BOTTLES DRAWN AEROBIC AND ANAEROBIC Blood Culture adequate volume   Culture   Final    NO GROWTH 3 DAYS Performed at [facility], [address] [place], [place] [address]    Report Status PENDING  Incomplete  MRSA PCR Screening     Status: Abnormal   Collection Time: 05/20/18  5:31 PM  Result Value Ref Range Status   MRSA by PCR POSITIVE (A) NEGATIVE Final    Comment:        The GeneXpert MRSA Assay (FDA approved for NASAL specimens only), is one component of a comprehensive MRSA colonization surveillance program. It is not intended to diagnose MRSA infection nor to guide  or monitor treatment for MRSA infections. RESULT CALLED TO, READ BACK BY AND VERIFIED WITH: AMBURN,A AT 0653 BY HUFFINES,S ON 05/21/18. Performed at [facility], [address] [place],  [address]   Culture, blood (Routine x 2)     Status: None (Preliminary result)   Collection Time: 05/22/18 10:12 AM  Result Value Ref Range Status   Specimen Description BLOOD LEFT HAND DRAWN BY RN  Final     Special Requests   Final    BOTTLES DRAWN AEROBIC AND ANAEROBIC Blood Culture results may not be optimal due to an excessive volume of blood received in culture bottles   Culture   Final    NO GROWTH < 24 HOURS Performed at [facility], [address], [place], [place] [address]    Report Status PENDING  Incomplete  Culture, blood (Routine x 2)     Status: None (Preliminary result)   Collection Time: 05/22/18 10:12 AM  Result Value Ref Range Status   Specimen Description BLOOD RIGHT HAND  Final   Special Requests   Final    BOTTLES DRAWN AEROBIC AND ANAEROBIC Blood Culture adequate volume   Culture   Final    NO GROWTH < 24 HOURS Performed at [facility], [address], [place], [place] [address]    Report Status PENDING  Incomplete     Studies: Dg Chest 1 View  Result Date: 05/22/2018 CLINICAL DATA:  Abdominal pain and fever in a patient with a history of multiple myeloma. EXAM: CHEST  1 VIEW COMPARISON:  PA and lateral chest 05/20/2018 and 05/11/2018. FINDINGS: Lung volumes are low but the lungs are clear. Heart size is normal. No pneumothorax or pleural effusion. Port-A-Cath is in place. Multiple lytic bony lesions consistent with multiple myeloma noted. IMPRESSION: No acute disease. Lytic bone lesions consistent with multiple myeloma. Electronically Signed   By: Inge Rise M.D.   On: 05/22/2018 11:17   Dg Chest Port 1 View  Result Date: 05/23/2018 CLINICAL DATA:  Multiple myeloma.  Unexplained fever. EXAM: PORTABLE CHEST 1 VIEW COMPARISON:  05/22/2018.  05/20/2018.   05/11/2018. FINDINGS: Power port remains in place. Heart size is normal. Chronic interstitial lung markings are again evident. There is mild patchy atelectasis and or pneumonia at the left lung base. The remainder the chest is clear. Multiple lytic skeletal lesions again demonstrated. IMPRESSION: New small area of atelectasis or pneumonia in the left lower lobe. Chronic interstitial lung markings elsewhere. Chronic lytic bone lesions. Electronically Signed   By: Nelson Chimes M.D.   On: 05/23/2018 07:37   Scheduled Meds: . acyclovir  400 mg Oral TID  . allopurinol  300 mg Oral Daily  . aspirin EC  81 mg Oral Daily  . Chlorhexidine Gluconate Cloth  6 each Topical Q0600  . enoxaparin (LOVENOX) injection  30 mg Subcutaneous Q24H  . feeding supplement (ENSURE ENLIVE)  237 mL Oral BID BM  . lisinopril  10 mg Oral Daily  . mupirocin ointment  1 application Nasal BID  . pantoprazole  40 mg Oral Daily  . polyethylene glycol  17 g Oral BID   Continuous Infusions: . sodium chloride 100 mL/hr at 05/23/18 0759  . ceFEPime (MAXIPIME) IV    . vancomycin      Principal Problem:   Fever Active Problems:   Multiple myeloma not having achieved remission (HCC)   S/P laparoscopic appendectomy   Sinus tachycardia   CKD (chronic kidney disease)   CAP (community acquired pneumonia)  Time spent:   Irwin Brakeman, MD, FAAFP [facility] Pager [phone]  If 7PM-7AM, please contact night-coverage [email or web address] Password [email or web address] 05/23/2018, 11:56 AM    LOS: 0 days

## 2018-05-23 NOTE — Progress Notes (Signed)
Pt's IV catheter removed and intact. Pt's IV site clean dry and intact. Discharge instructions including medications and follow up appointments were reviewed and discusses with patient. All questions were answered and no further questions at this time. Pt in stable condition and in no acute distress at time of discharge. Pt escorted by nurse tech

## 2018-05-24 ENCOUNTER — Emergency Department (HOSPITAL_COMMUNITY): Payer: Medicare Other

## 2018-05-24 ENCOUNTER — Other Ambulatory Visit: Payer: Self-pay

## 2018-05-24 ENCOUNTER — Encounter (HOSPITAL_COMMUNITY): Payer: Self-pay | Admitting: Emergency Medicine

## 2018-05-24 ENCOUNTER — Inpatient Hospital Stay (HOSPITAL_COMMUNITY)
Admission: EM | Admit: 2018-05-24 | Discharge: 2018-05-26 | DRG: 841 | Disposition: A | Discharge status: Home / Self Care | Payer: Medicare Other | Attending: Family Medicine | Admitting: Family Medicine

## 2018-05-24 DIAGNOSIS — N183 Chronic kidney disease, stage 3 (moderate): Secondary | ICD-10-CM | POA: Diagnosis present

## 2018-05-24 DIAGNOSIS — Z79899 Other long term (current) drug therapy: Secondary | ICD-10-CM

## 2018-05-24 DIAGNOSIS — R0602 Shortness of breath: Secondary | ICD-10-CM | POA: Diagnosis present

## 2018-05-24 DIAGNOSIS — Z885 Allergy status to narcotic agent status: Secondary | ICD-10-CM

## 2018-05-24 DIAGNOSIS — I129 Hypertensive chronic kidney disease with stage 1 through stage 4 chronic kidney disease, or unspecified chronic kidney disease: Secondary | ICD-10-CM | POA: Diagnosis present

## 2018-05-24 DIAGNOSIS — Z9049 Acquired absence of other specified parts of digestive tract: Secondary | ICD-10-CM

## 2018-05-24 DIAGNOSIS — R195 Other fecal abnormalities: Secondary | ICD-10-CM

## 2018-05-24 DIAGNOSIS — C9000 Multiple myeloma not having achieved remission: Secondary | ICD-10-CM | POA: Diagnosis not present

## 2018-05-24 DIAGNOSIS — R079 Chest pain, unspecified: Secondary | ICD-10-CM | POA: Diagnosis not present

## 2018-05-24 DIAGNOSIS — D696 Thrombocytopenia, unspecified: Secondary | ICD-10-CM | POA: Diagnosis present

## 2018-05-24 DIAGNOSIS — Z7982 Long term (current) use of aspirin: Secondary | ICD-10-CM

## 2018-05-24 DIAGNOSIS — E876 Hypokalemia: Secondary | ICD-10-CM | POA: Diagnosis present

## 2018-05-24 DIAGNOSIS — N390 Urinary tract infection, site not specified: Secondary | ICD-10-CM | POA: Diagnosis present

## 2018-05-24 DIAGNOSIS — F419 Anxiety disorder, unspecified: Secondary | ICD-10-CM | POA: Diagnosis present

## 2018-05-24 DIAGNOSIS — Z9221 Personal history of antineoplastic chemotherapy: Secondary | ICD-10-CM

## 2018-05-24 DIAGNOSIS — A498 Other bacterial infections of unspecified site: Secondary | ICD-10-CM | POA: Diagnosis present

## 2018-05-24 DIAGNOSIS — D630 Anemia in neoplastic disease: Secondary | ICD-10-CM | POA: Diagnosis present

## 2018-05-24 DIAGNOSIS — B965 Pseudomonas (aeruginosa) (mallei) (pseudomallei) as the cause of diseases classified elsewhere: Secondary | ICD-10-CM | POA: Diagnosis present

## 2018-05-24 DIAGNOSIS — F329 Major depressive disorder, single episode, unspecified: Secondary | ICD-10-CM | POA: Diagnosis present

## 2018-05-24 DIAGNOSIS — R000 Tachycardia, unspecified: Secondary | ICD-10-CM | POA: Diagnosis present

## 2018-05-24 LAB — TROPONIN I
Troponin I: <0.03 ng/mL (ref <0.03)
Troponin I: <0.03 ng/mL (ref <0.03)

## 2018-05-24 LAB — COMPREHENSIVE METABOLIC PANEL
ALT: 46 U/L — ABNORMAL HIGH (ref 0–44)
AST: 23 U/L (ref 15–41)
Albumin: 2.4 g/dL — ABNORMAL LOW (ref 3.5–5.0)
Alkaline Phosphatase: 125 U/L (ref 38–126)
Anion gap: 9 (ref 5–15)
BUN: 22 mg/dL (ref 8–23)
CHLORIDE: 104 mmol/L (ref 98–111)
CO2: 21 mmol/L — ABNORMAL LOW (ref 22–32)
CREATININE: 2.08 mg/dL — AB (ref 0.44–1.00)
Calcium: 7 mg/dL — ABNORMAL LOW (ref 8.9–10.3)
GFR, EST AFRICAN AMERICAN: 27 mL/min — AB (ref >60)
GFR, EST NON AFRICAN AMERICAN: 23 mL/min — AB (ref >60)
Glucose, Bld: 236 mg/dL — ABNORMAL HIGH (ref 70–99)
POTASSIUM: 3.1 mmol/L — AB (ref 3.5–5.1)
Sodium: 134 mmol/L — ABNORMAL LOW (ref 135–145)
TOTAL PROTEIN: 5.9 g/dL — AB (ref 6.5–8.1)
Total Bilirubin: 0.7 mg/dL (ref 0.3–1.2)

## 2018-05-24 LAB — URINALYSIS, ROUTINE W REFLEX MICROSCOPIC
BACTERIA UA: NONE SEEN
Bilirubin Urine: NEGATIVE
Glucose, UA: 50 mg/dL — AB
Hgb urine dipstick: NEGATIVE
KETONES UR: NEGATIVE mg/dL
Leukocytes, UA: NEGATIVE
Nitrite: NEGATIVE
PROTEIN: 30 mg/dL — AB
Specific Gravity, Urine: 1.012 (ref 1.005–1.030)
pH: 5 (ref 5.0–8.0)

## 2018-05-24 LAB — CBC WITH DIFFERENTIAL/PLATELET
Basophils Absolute: 0.1 10*3/uL (ref 0.0–0.1)
Basophils Relative: 1 %
EOS PCT: 7 %
Eosinophils Absolute: 0.8 10*3/uL — ABNORMAL HIGH (ref 0.0–0.7)
HEMATOCRIT: 23.7 % — AB (ref 36.0–46.0)
HEMOGLOBIN: 7.8 g/dL — AB (ref 12.0–15.0)
LYMPHS ABS: 1.9 10*3/uL (ref 0.7–4.0)
Lymphocytes Relative: 16 %
MCH: 30.5 pg (ref 26.0–34.0)
MCHC: 32.9 g/dL (ref 30.0–36.0)
MCV: 92.6 fL (ref 78.0–100.0)
MONO ABS: 0.8 10*3/uL (ref 0.1–1.0)
MONOS PCT: 7 %
NEUTROS ABS: 8.2 10*3/uL — AB (ref 1.7–7.7)
Neutrophils Relative %: 69 %
Platelets: 111 10*3/uL — ABNORMAL LOW (ref 150–400)
RBC: 2.56 MIL/uL — AB (ref 3.87–5.11)
RDW: 15.9 % — AB (ref 11.5–15.5)
WBC: 11.8 10*3/uL — AB (ref 4.0–10.5)

## 2018-05-24 LAB — URINE CULTURE
CULTURE: NO GROWTH
SPECIAL REQUESTS: NORMAL

## 2018-05-24 LAB — MAGNESIUM: MAGNESIUM: 1.4 mg/dL — AB (ref 1.7–2.4)

## 2018-05-24 LAB — I-STAT CG4 LACTIC ACID, ED: LACTIC ACID, VENOUS: 1.99 mmol/L — AB (ref 0.5–1.9)

## 2018-05-24 LAB — INFLUENZA PANEL BY PCR (TYPE A & B)
INFLBPCR: NEGATIVE
Influenza A By PCR: NEGATIVE

## 2018-05-24 LAB — POC OCCULT BLOOD, ED: FECAL OCCULT BLD: NEGATIVE

## 2018-05-24 MED ORDER — ONDANSETRON HCL 4 MG/2ML IJ SOLN
4.0000 mg | Freq: Four times a day (QID) | INTRAMUSCULAR | Status: DC | PRN
  Administered 2018-05-25: 4 mg via INTRAVENOUS
  Filled 2018-05-24: qty 2

## 2018-05-24 MED ORDER — LISINOPRIL 10 MG PO TABS
10.0000 mg | ORAL_TABLET | Freq: Every day | ORAL | Status: DC
  Administered 2018-05-25 – 2018-05-26 (×2): 10 mg via ORAL
  Filled 2018-05-24 (×3): qty 1

## 2018-05-24 MED ORDER — POTASSIUM CHLORIDE CRYS ER 20 MEQ PO TBCR
40.0000 meq | EXTENDED_RELEASE_TABLET | ORAL | Status: AC
  Administered 2018-05-24 (×2): 40 meq via ORAL
  Filled 2018-05-24 (×2): qty 2

## 2018-05-24 MED ORDER — ACETAMINOPHEN 650 MG RE SUPP: 650.0000 mg | Freq: Four times a day (QID) | RECTAL | Status: DC | PRN

## 2018-05-24 MED ORDER — ACETAMINOPHEN 325 MG PO TABS
650.0000 mg | ORAL_TABLET | Freq: Four times a day (QID) | ORAL | Status: DC | PRN
  Administered 2018-05-25 – 2018-05-26 (×3): 650 mg via ORAL
  Filled 2018-05-24 (×3): qty 2

## 2018-05-24 MED ORDER — POTASSIUM CHLORIDE CRYS ER 20 MEQ PO TBCR: 40.0000 meq | EXTENDED_RELEASE_TABLET | Freq: Once | ORAL | Status: DC

## 2018-05-24 MED ORDER — KCL-LACTATED RINGERS 20 MEQ/L IV SOLN
INTRAVENOUS | Status: DC
  Filled 2018-05-24 (×2): qty 1000

## 2018-05-24 MED ORDER — MAGNESIUM SULFATE 2 GM/50ML IV SOLN
2.0000 g | Freq: Once | INTRAVENOUS | Status: AC
  Administered 2018-05-24: 2 g via INTRAVENOUS
  Filled 2018-05-24: qty 50

## 2018-05-24 MED ORDER — HYDROCODONE-ACETAMINOPHEN 5-325 MG PO TABS
1.0000 | ORAL_TABLET | Freq: Once | ORAL | Status: AC
  Administered 2018-05-24: 1 via ORAL
  Filled 2018-05-24: qty 1

## 2018-05-24 MED ORDER — ENSURE ENLIVE PO LIQD
237.0000 mL | Freq: Two times a day (BID) | ORAL | Status: DC
  Administered 2018-05-25 (×2): 237 mL via ORAL

## 2018-05-24 MED ORDER — HYDROCODONE-ACETAMINOPHEN 5-325 MG PO TABS
1.0000 | ORAL_TABLET | Freq: Two times a day (BID) | ORAL | Status: DC
  Administered 2018-05-24 – 2018-05-26 (×4): 1 via ORAL
  Filled 2018-05-24 (×4): qty 1

## 2018-05-24 MED ORDER — ALPRAZOLAM 0.25 MG PO TABS
0.2500 mg | ORAL_TABLET | Freq: Two times a day (BID) | ORAL | Status: DC | PRN
  Administered 2018-05-24 – 2018-05-25 (×3): 0.25 mg via ORAL
  Filled 2018-05-24 (×3): qty 1

## 2018-05-24 MED ORDER — ZOLPIDEM TARTRATE 5 MG PO TABS
5.0000 mg | ORAL_TABLET | Freq: Once | ORAL | Status: AC
  Administered 2018-05-25: 5 mg via ORAL
  Filled 2018-05-24: qty 1

## 2018-05-24 MED ORDER — SODIUM CHLORIDE 0.9 % IV BOLUS: 500.0000 mL | Freq: Once | INTRAVENOUS | Status: DC

## 2018-05-24 MED ORDER — SODIUM CHLORIDE 0.9 % IV BOLUS
1000.0000 mL | Freq: Once | INTRAVENOUS | Status: AC
  Administered 2018-05-24: 1000 mL via INTRAVENOUS

## 2018-05-24 MED ORDER — ONDANSETRON HCL 4 MG PO TABS
4.0000 mg | ORAL_TABLET | Freq: Four times a day (QID) | ORAL | Status: DC | PRN
  Administered 2018-05-25: 4 mg via ORAL
  Filled 2018-05-24: qty 1

## 2018-05-24 MED ORDER — ALLOPURINOL 300 MG PO TABS
300.0000 mg | ORAL_TABLET | Freq: Every day | ORAL | Status: DC
  Administered 2018-05-25 – 2018-05-26 (×2): 300 mg via ORAL
  Filled 2018-05-24 (×3): qty 1

## 2018-05-24 MED ORDER — LENALIDOMIDE 10 MG PO CAPS: 10.0000 mg | ORAL_CAPSULE | Freq: Every day | ORAL | Status: DC

## 2018-05-24 MED ORDER — ACYCLOVIR 800 MG PO TABS
400.0000 mg | ORAL_TABLET | Freq: Two times a day (BID) | ORAL | Status: DC
  Administered 2018-05-24 – 2018-05-26 (×4): 400 mg via ORAL
  Filled 2018-05-24 (×4): qty 1

## 2018-05-24 NOTE — ED Provider Notes (Signed)
[facility] EMERGENCY DEPARTMENT Provider Note   CSN: [id number] Arrival date & time: 05/24/18  1308     History   Chief Complaint Chief Complaint  Patient presents with  . Shortness of Breath    HPI Alexandra Price is a 72 y.o. female.  HPI  72 year old female presents for evaluation of left-sided chest pain and shortness of breath.  Had appendicitis with an appendectomy on 10/1.  On 10/3 return to the ER and was diagnosed with a febrile illness and started on antibiotics.  She left against the wishes of the hospitalist yesterday because she states she could not stand being in the hospital any further.  She was currently being treated with pneumonia and was prescribed doxycycline but she has not picked this up.  Today she states she is still having pain under her left breast in the mid axillary line.  No significant cough but she is having some postnasal drainage.  She has not noted a fever since she left.  Has some abdominal soreness from the surgery but no new pain.  No vomiting.  She is having multiple loose stools, up to 6 today.  She has not taken the hydrocodone prescribed to her from surgery for the pain.  It is rated as an 8/10.  She is concerned because she is not getting better but is not necessarily thinking she is gotten worse.  Past Medical History:  Diagnosis Date  . Anxiety   . Depression   . Hypertension   . Multiple myeloma ([place])    multiple myeloma    Patient Active Problem List   Diagnosis Date Noted  . CAP (community acquired pneumonia) 05/23/2018  . Fever 05/22/2018  . Sinus tachycardia 05/22/2018  . CKD (chronic kidney disease) 05/22/2018  . S/P laparoscopic appendectomy 05/20/2018  . Multiple myeloma without remission ([place]) 04/15/2018  . Goals of care, counseling/discussion 04/15/2018  . Multiple myeloma not having achieved remission ([place]) 04/07/2018    Past Surgical History:  Procedure Laterality Date  . ABDOMINAL HYSTERECTOMY     total  .  LAPAROSCOPIC APPENDECTOMY N/A 05/20/2018   Procedure: APPENDECTOMY LAPAROSCOPIC;  Surgeon: [name], MD;  Location: [facility];  Service: General;  Laterality: N/A;  . PORTACATH PLACEMENT Right 04/14/2018   Procedure: INSERTION PORT-A-CATH;  Surgeon: [name], MD;  Location: [facility];  Service: General;  Laterality: Right;     OB History   None      Home Medications    Prior to Admission medications   Medication Sig Start Date End Date Taking? Authorizing Provider  acyclovir (ZOVIRAX) 400 MG tablet Take 1 tablet (400 mg total) by mouth 2 (two) times daily. 05/23/18   Johnson, Clanford L, MD  allopurinol (ZYLOPRIM) 300 MG tablet Take 1 tablet (300 mg total) by mouth daily. 04/24/18   [name], MD  ALPRAZolam Duanne Moron) 0.25 MG tablet Take 1 tablet (0.25 mg total) by mouth 2 (two) times daily as needed for anxiety. 05/15/18   [name], MD  aspirin EC 81 MG tablet Take 81 mg by mouth daily.    [provider]  Calcium Carb-Cholecalciferol (CALCIUM 1000 + D PO) Take 1,000 mg by mouth daily. Take one tablet daily until next visit with oncologist.    [provider]  dexamethasone (DECADRON) 4 MG tablet Take 5 tablets (20 mg) on days 1, 2, 8, 9, 15 and 16 on the days of chemotherapy.  Take 10 tablets during the week of no chemotherapy. 04/15/18   Delton Coombes,  Dirk Dress, MD  doxycycline (VIBRAMYCIN) 100 MG capsule Take 1 capsule (100 mg total) by mouth 2 (two) times daily. 05/23/18   Johnson, Clanford L, MD  feeding supplement, ENSURE ENLIVE, (ENSURE ENLIVE) LIQD Take 237 mLs by mouth 2 (two) times daily between meals for 14 days. 05/24/18 06/07/18  Johnson, Clanford L, MD  HYDROcodone-acetaminophen (NORCO) 5-325 MG tablet Take 1 tablet by mouth 2 (two) times daily. 05/15/18   Lockamy, Randi L, NP-C  lenalidomide (REVLIMID) 10 MG capsule Take 1 capsule (10 mg total) by mouth daily. 05/14/18   [name], MD  lidocaine-prilocaine (EMLA) cream Apply to  affected area once 04/15/18   [name], MD  lisinopril (PRINIVIL,ZESTRIL) 10 MG tablet Take 1 tablet (10 mg total) by mouth daily. 04/10/18   Nat Christen, MD  ondansetron (ZOFRAN) 4 MG tablet Take 1 tablet (4 mg total) by mouth every 8 (eight) hours as needed for nausea or vomiting. 04/22/18   Lockamy, Randi L, NP-C  pantoprazole (PROTONIX) 40 MG tablet Take 1 tablet (40 mg total) by mouth daily. 05/09/18   Lockamy, Randi L, NP-C  polyethylene glycol (MIRALAX / GLYCOLAX) packet Take 17 g by mouth daily. 05/23/18 06/22/18  Murlean Iba, MD  prochlorperazine (COMPAZINE) 10 MG tablet Take 1 tablet (10 mg total) by mouth every 6 (six) hours as needed (Nausea or vomiting). 04/15/18   [name], MD  promethazine (PHENERGAN) 25 MG tablet Take 1 tablet (25 mg total) by mouth every 6 (six) hours as needed. 04/27/18   Nat Christen, MD  temazepam (RESTORIL) 15 MG capsule Take 1 capsule (15 mg total) by mouth at bedtime as needed for sleep. 04/24/18   Lockamy, Randi L, NP-C  traMADol (ULTRAM) 50 MG tablet Take 1 tablet (50 mg total) by mouth every 6 (six) hours as needed. 04/15/18   Lockamy, Randi L, NP-C  trolamine salicylate (ASPERCREME) 10 % cream Apply 1 application topically daily as needed for muscle pain.    [provider]    Family History Family History  Problem Relation Age of Onset  . Heart disease Mother   . Emphysema Father   . Diabetes Sister   . Cancer Brother        liver, lung, and colon  . Diabetes Brother     Social History Social History   Tobacco Use  . Smoking status: Never Smoker  . Smokeless tobacco: Never Used  Substance Use Topics  . Alcohol use: Never    Frequency: Never  . Drug use: Not Currently     Allergies   Morphine and related   Review of Systems Review of Systems  Constitutional: Negative for fever.  HENT: Positive for postnasal drip.   Respiratory: Positive for shortness of breath. Negative for cough.   Cardiovascular:  Positive for chest pain.  Gastrointestinal: Positive for abdominal pain (mild) and diarrhea. Negative for vomiting.  All other systems reviewed and are negative.    Physical Exam Updated Vital Signs BP (!) 146/76   Pulse (!) 104   Temp 98.6 F (37 C) (Oral)   Resp 18   Ht 5' 5"  (1.651 m)   Wt 72 kg   SpO2 98%   BMI 26.41 kg/m   Physical Exam  Constitutional: She appears well-developed and well-nourished.  Non-toxic appearance. She does not appear ill.  HENT:  Head: Normocephalic and atraumatic.  Right Ear: External ear normal.  Left Ear: External ear normal.  Nose: Nose normal.  Eyes: Right eye exhibits no discharge. Left  eye exhibits no discharge.  Cardiovascular: Regular rhythm and normal heart sounds. Tachycardia present.  Pulmonary/Chest: Effort normal and breath sounds normal. She has no wheezes. She has no rhonchi. She has no rales. She exhibits tenderness (mild).    Abdominal: Soft. There is generalized tenderness (mild).  Neurological: She is alert.  Skin: Skin is warm and dry. She is not diaphoretic.  Psychiatric: Her mood appears anxious.  Nursing note and vitals reviewed.    ED Treatments / Results  Labs (all labs ordered are listed, but only abnormal results are displayed) Labs Reviewed  CULTURE, BLOOD (ROUTINE X 2)  CULTURE, BLOOD (ROUTINE X 2)  URINE CULTURE  COMPREHENSIVE METABOLIC PANEL  CBC WITH DIFFERENTIAL/PLATELET  URINALYSIS, ROUTINE W REFLEX MICROSCOPIC  I-STAT CG4 LACTIC ACID, ED  I-STAT TROPONIN, ED    EKG None  Radiology Nm Pulmonary Perf And Vent  Result Date: 05/23/2018 CLINICAL DATA:  Three days post appendectomy, chest pain, high pretest clinical probability for pulmonary embolism EXAM: NUCLEAR MEDICINE VENTILATION - PERFUSION LUNG SCAN TECHNIQUE: Ventilation images were obtained in multiple projections using inhaled aerosol Tc-56mDTPA. Perfusion images were obtained in multiple projections after intravenous injection of  Tc-91mAA. RADIOPHARMACEUTICALS:  32 mCi of Tc-9955mPA aerosol inhalation and 4.4 mCi Tc99m69m IV COMPARISON:  None Correlation: Chest radiograph 05/23/2018 FINDINGS: Ventilation: Normal Perfusion: Diminished perfusion at the posterior lung bases bilaterally. Observed pattern suggests presence of small bibasilar pleural effusions or minimal atelectasis. Portable chest radiograph demonstrates subsegmental atelectasis at LEFT base but no definite pleural effusions. No other perfusion defects identified. IMPRESSION: Low probability for pulmonary embolism. Electronically Signed   By: MarkLavonia Dana.   On: 05/23/2018 12:26   Us V[place]ous Img Lower Bilateral  Result Date: 05/23/2018 CLINICAL DATA:  Bilateral lower extremity pain and edema. Elevated D-dimer. History of multiple myeloma. Evaluate for DVT EXAM: BILATERAL LOWER EXTREMITY VENOUS DOPPLER ULTRASOUND TECHNIQUE: Gray-scale sonography with graded compression, as well as color Doppler and duplex ultrasound were performed to evaluate the lower extremity deep venous systems from the level of the common femoral vein and including the common femoral, femoral, profunda femoral, popliteal and calf veins including the posterior tibial, peroneal and gastrocnemius veins when visible. The superficial great saphenous vein was also interrogated. Spectral Doppler was utilized to evaluate flow at rest and with distal augmentation maneuvers in the common femoral, femoral and popliteal veins. COMPARISON:  None. FINDINGS: RIGHT LOWER EXTREMITY Common Femoral Vein: No evidence of thrombus. Normal compressibility, respiratory phasicity and response to augmentation. Saphenofemoral Junction: No evidence of thrombus. Normal compressibility and flow on color Doppler imaging. Profunda Femoral Vein: No evidence of thrombus. Normal compressibility and flow on color Doppler imaging. Femoral Vein: No evidence of thrombus. Normal compressibility, respiratory phasicity and response to  augmentation. Popliteal Vein: No evidence of thrombus. Normal compressibility, respiratory phasicity and response to augmentation. Calf Veins: No evidence of thrombus. Normal compressibility and flow on color Doppler imaging. Superficial Great Saphenous Vein: No evidence of thrombus. Normal compressibility. Venous Reflux:  None. Other Findings:  None. LEFT LOWER EXTREMITY Common Femoral Vein: No evidence of thrombus. Normal compressibility, respiratory phasicity and response to augmentation. Saphenofemoral Junction: No evidence of thrombus. Normal compressibility and flow on color Doppler imaging. Profunda Femoral Vein: No evidence of thrombus. Normal compressibility and flow on color Doppler imaging. Femoral Vein: No evidence of thrombus. Normal compressibility, respiratory phasicity and response to augmentation. Popliteal Vein: No evidence of thrombus. Normal compressibility, respiratory phasicity and response to augmentation. Calf Veins: No evidence of thrombus.  Normal compressibility and flow on color Doppler imaging. Superficial Great Saphenous Vein: No evidence of thrombus. Normal compressibility. Venous Reflux:  None. Other Findings:  None. IMPRESSION: No evidence of DVT within either lower extremity. Electronically Signed   By: Sandi Mariscal M.D.   On: 05/23/2018 12:43   Dg Chest Port 1 View  Result Date: 05/23/2018 CLINICAL DATA:  Multiple myeloma.  Unexplained fever. EXAM: PORTABLE CHEST 1 VIEW COMPARISON:  05/22/2018.  05/20/2018.  05/11/2018. FINDINGS: Power port remains in place. Heart size is normal. Chronic interstitial lung markings are again evident. There is mild patchy atelectasis and or pneumonia at the left lung base. The remainder the chest is clear. Multiple lytic skeletal lesions again demonstrated. IMPRESSION: New small area of atelectasis or pneumonia in the left lower lobe. Chronic interstitial lung markings elsewhere. Chronic lytic bone lesions. Electronically Signed   By: Nelson Chimes  M.D.   On: 05/23/2018 07:37    Procedures Procedures (including critical care time)  Medications Ordered in ED Medications  sodium chloride 0.9 % bolus 1,000 mL (has no administration in time range)     Initial Impression / Assessment and Plan / ED Course  I have reviewed the triage vital signs and the nursing notes.  Pertinent labs & imaging results that were available during my care of the patient were reviewed by me and considered in my medical decision making (see chart for details).     No clear etiology for the patient's chest pain.  Troponin is negative.  ECG without acute ischemic changes.  There is no obvious pneumonia or other acute abnormalities on her chest x-ray.  There is some level of concern for possible PE but she just had a VQ scan yesterday that was negative, though did not have the symptoms today.  Overall this is likely a progression of her symptoms she was admitted for but then left early form.  Thus she will be readmitted.  Her hemoglobin is slightly decreased from yesterday but no blood in her stool per patient and occult exam performed by me shows no obvious gross blood or occult blood.  Appears stable for floor admission.  Final Clinical Impressions(s) / ED Diagnoses   Final diagnoses:  None    ED Discharge Orders    None       Sherwood Gambler, MD 05/24/18 [phone]

## 2018-05-24 NOTE — H&P (Addendum)
History and Physical    ROSELA SUPAK KGU:[id number] DOB: May 03, 1946 DOA: 05/24/2018  PCP: Patient, No Pcp Per   Patient coming from: Home  Chief Complaint: Malaise, Shortness of breath  HPI: ZACHARY LOVINS is a 72 y.o. female with medical history significant for Multiple myeloma, CKD3, who presented to the ED, with c/o SOB of 2 days, since recent hospitalization 05/22/18.  Patient reports shortness of breath is unchanged the past few days , change with exertion and at rest, and she actually feels it is more anxiety related.  She reports some mild chest pain to her left chest, that started last night, persistent, worse with movement, unchanged with deep breathing.  She denies trauma or fall.   Patient was admitted 10/1, had an appendectomy 10/1.  Discharged home 10/2, presented to the ED 10/3 with complaints of fever body aches, also tachycardic, was admitted for fever of unknown source, placed on vancomycin, cefepime and Flagyl initially.  T-max on admission 101.  Patient had VQ scan and lower extremity Doppler both of which were negative for venous thromboembolism.  Blood cultures still negative. Patient left the hospital AMA 10/4.  Was sent a prescription for doxycycline which she has not been able to pick up.  But last night and this morning patient reports she felt worse, hence presentation to the ED.  She reports maximum temps-  T- 99 at home.  No dysuria, no headache or neck pain,   Patient reports since left the hospital yesterday, she had 6 dark and watery loose stools yesterday, and again today.  Patient had a smog enema 05/22/18-had a large bowel movement.  She does not think this loose stool is related to the enema.  She reports poor p.o. intake but denies abdominal pain, no vomiting.  From a surgical standpoint apart from incisional site pain, she is having no problems.  ED Course: Tachycardic to 104, afebrile.  Otherwise unremarkable vitals.  Lactic acid-  1.99 .  K low 3.1 .   Hemoglobin 7.8. Creatinine at baseline 2.08.  WBC 11.8. Trop X 1 - negative.  EKG without ST or T wave changes. 2-view chest x-ray no acute cardiopulmonary disease, diffuse lytic lesions throughout the bones compatible with myeloma with healing bilateral rib fractures. 1L bolus normal saline given in the ED. Blood cultures and urine cultures obtained hospitalist called to admit.   Review of Systems: As per HPI all other systems reviewed and negative.  Past Medical History:  Diagnosis Date  . Anxiety   . Depression   . Hypertension   . Multiple myeloma ([place])    multiple myeloma    Past Surgical History:  Procedure Laterality Date  . ABDOMINAL HYSTERECTOMY     total  . LAPAROSCOPIC APPENDECTOMY N/A 05/20/2018   Procedure: APPENDECTOMY LAPAROSCOPIC;  Surgeon: [name], MD;  Location: [facility];  Service: General;  Laterality: N/A;  . PORTACATH PLACEMENT Right 04/14/2018   Procedure: INSERTION PORT-A-CATH;  Surgeon: [name], MD;  Location: [facility];  Service: General;  Laterality: Right;     reports that she has never smoked. She has never used smokeless tobacco. She reports that she has current or past drug history. She reports that she does not drink alcohol.  Allergies  Allergen Reactions  . Morphine And Related Nausea And Vomiting    Family History  Problem Relation Age of Onset  . Heart disease Mother   . Emphysema Father   . Diabetes Sister   . Cancer Brother  liver, lung, and colon  . Diabetes Brother     Prior to Admission medications   Medication Sig Start Date End Date Taking? Authorizing Provider  acyclovir (ZOVIRAX) 400 MG tablet Take 1 tablet (400 mg total) by mouth 2 (two) times daily. 05/23/18  Yes Johnson, Clanford L, MD  allopurinol (ZYLOPRIM) 300 MG tablet Take 1 tablet (300 mg total) by mouth daily. 04/24/18  Yes [name], MD  ALPRAZolam Duanne Moron) 0.25 MG tablet Take 1 tablet (0.25 mg total) by mouth 2 (two) times daily as needed for  anxiety. 05/15/18  Yes [name], MD  aspirin EC 81 MG tablet Take 81 mg by mouth daily.   Yes [provider]  Calcium Carb-Cholecalciferol (CALCIUM 1000 + D PO) Take 1,000 mg by mouth daily. Take one tablet daily until next visit with oncologist.   Yes [provider]  dexamethasone (DECADRON) 4 MG tablet Take 5 tablets (20 mg) on days 1, 2, 8, 9, 15 and 16 on the days of chemotherapy.  Take 10 tablets during the week of no chemotherapy. 04/15/18  Yes [name], MD  feeding supplement, ENSURE ENLIVE, (ENSURE ENLIVE) LIQD Take 237 mLs by mouth 2 (two) times daily between meals for 14 days. 05/24/18 06/07/18 Yes Johnson, Clanford L, MD  HYDROcodone-acetaminophen (NORCO) 5-325 MG tablet Take 1 tablet by mouth 2 (two) times daily. 05/15/18  Yes Lockamy, Randi L, NP-C  lenalidomide (REVLIMID) 10 MG capsule Take 1 capsule (10 mg total) by mouth daily. 05/14/18  Yes [name], MD  lidocaine-prilocaine (EMLA) cream Apply to affected area once 04/15/18  Yes [name], MD  lisinopril (PRINIVIL,ZESTRIL) 10 MG tablet Take 1 tablet (10 mg total) by mouth daily. 04/10/18  Yes Nat Christen, MD  ondansetron (ZOFRAN) 4 MG tablet Take 1 tablet (4 mg total) by mouth every 8 (eight) hours as needed for nausea or vomiting. 04/22/18  Yes Lockamy, Randi L, NP-C  pantoprazole (PROTONIX) 40 MG tablet Take 1 tablet (40 mg total) by mouth daily. 05/09/18  Yes Lockamy, Randi L, NP-C  polyethylene glycol (MIRALAX / GLYCOLAX) packet Take 17 g by mouth daily. 05/23/18 06/22/18 Yes Johnson, Clanford L, MD  prochlorperazine (COMPAZINE) 10 MG tablet Take 1 tablet (10 mg total) by mouth every 6 (six) hours as needed (Nausea or vomiting). 04/15/18  Yes [name], MD  promethazine (PHENERGAN) 25 MG tablet Take 1 tablet (25 mg total) by mouth every 6 (six) hours as needed. 04/27/18  Yes Nat Christen, MD  temazepam (RESTORIL) 15 MG capsule Take 1 capsule (15 mg total) by mouth at  bedtime as needed for sleep. 04/24/18  Yes Lockamy, Randi L, NP-C  traMADol (ULTRAM) 50 MG tablet Take 1 tablet (50 mg total) by mouth every 6 (six) hours as needed. 04/15/18  Yes Lockamy, Randi L, NP-C  trolamine salicylate (ASPERCREME) 10 % cream Apply 1 application topically daily as needed for muscle pain.   Yes [provider]  doxycycline (VIBRAMYCIN) 100 MG capsule Take 1 capsule (100 mg total) by mouth 2 (two) times daily. 05/23/18   Murlean Iba, MD    Physical Exam: Vitals:   05/24/18 1318 05/24/18 1319 05/24/18 1330  BP: (!) 146/76  (!) 159/66  Pulse: (!) 104  (!) 102  Resp: 18    Temp: 98.6 F (37 C)    TempSrc: Oral    SpO2: 98%  97%  Weight:  72 kg   Height:  5' 5"  (1.651 m)     Constitutional: NAD, calm,  comfortable, became anxious and tearful during exam-because her son, who lives with her, is upset with her for being sick. Vitals:   05/24/18 1318 05/24/18 1319 05/24/18 1330  BP: (!) 146/76  (!) 159/66  Pulse: (!) 104  (!) 102  Resp: 18    Temp: 98.6 F (37 C)    TempSrc: Oral    SpO2: 98%  97%  Weight:  72 kg   Height:  5' 5"  (1.651 m)    Eyes: PERRL, lids and conjunctivae normal ENMT: Mucous membranes are moist. Posterior pharynx clear of any exudate or lesions. Neck: normal, supple, no masses, no thyromegaly Respiratory: clear to auscultation bilaterally, no wheezing, no crackles. Normal respiratory effort. No accessory muscle use.  Cardiovascular: Regular rate and rhythm, no murmurs / rubs / gallops. No extremity edema. 2+ pedal pulses. Point tenderness left lower rib, tender to palpation.  Port- a cart- right upper chest, site clean, no surrounding erythema or tenderness Abdomen: no tenderness, soft, clean Band-Aid dressing on abdomen, no masses palpated. No hepatosplenomegaly. Bowel sounds positive.  Musculoskeletal: no clubbing / cyanosis. No joint deformity upper and lower extremities. Good ROM, no contractures. Normal muscle tone.  Skin:  Seborrheic keratosis on back, lesions, ulcers. No induration Neurologic: CN 2-12 grossly intact. Burning/tingling stocking and glove pattern bilateral upper and lower extremities, but with Sensation intact, Strength 5/5 in all 4.  Psychiatric: Normal judgment and insight. Alert and oriented x 3.  Sad and anxious affect.   Labs on Admission: I have personally reviewed following labs and imaging studies  CBC: Recent Labs  Lab 05/20/18 1058 05/21/18 0356 05/22/18 1012 05/23/18 0511 05/24/18 1421  WBC 14.8* 15.0* 12.7* 13.6* 11.8*  NEUTROABS 13.7  --  10.8 11.1* 8.2*  HGB 9.0* 7.9* 8.6* 8.6* 7.8*  HCT 27.7* 24.2* 26.0* 26.7* 23.7*  MCV 93.6 94.2 92.9 94.7 92.6  PLT 170 147* 133* 125* 761*   Basic Metabolic Panel: Recent Labs  Lab 05/20/18 1058 05/21/18 0356 05/22/18 1012 05/23/18 0511 05/24/18 1421  NA 130* 132* 132* 137 134*  K 4.5 4.6 4.0 4.3 3.1*  CL 96* 100 99 105 104  CO2 24 20* 22 23 21*  GLUCOSE 158* 340* 149* 165* 236*  BUN 25* 26* 23 21 22   CREATININE 2.13* 2.32* 2.28* 2.20* 2.08*  CALCIUM 7.6* 6.7* 6.7* 6.5* 7.0*   GFR: Estimated Creatinine Clearance: 25 mL/min (A) (by C-G formula based on SCr of 2.08 mg/dL (H)). Liver Function Tests: Recent Labs  Lab 05/20/18 1058 05/22/18 1012 05/23/18 0511 05/24/18 1421  AST 18 22 25 23   ALT 21 38 48* 46*  ALKPHOS 150* 122 128* 125  BILITOT 0.5 0.5 0.6 0.7  PROT 7.8 6.9 6.3* 5.9*  ALBUMIN 3.0* 2.6* 2.5* 2.4*   Recent Labs  Lab 05/22/18 1036  LIPASE 25   No results for input(s): AMMONIA in the last 168 hours. Coagulation Profile: Recent Labs  Lab 05/22/18 1012  INR 1.18   Cardiac Enzymes: Recent Labs  Lab 05/22/18 1457 05/23/18 0511 05/24/18 1421  CKTOTAL 18* 14*  --   TROPONINI  --   --  <0.03   Urine analysis:    Component Value Date/Time   COLORURINE YELLOW 05/22/2018 [place] 05/22/2018 1228   LABSPEC 1.009 05/22/2018 1228   PHURINE 6.0 05/22/2018 1228   GLUCOSEU NEGATIVE  05/22/2018 1228   HGBUR NEGATIVE 05/22/2018 1228   BILIRUBINUR NEGATIVE 05/22/2018 1228   KETONESUR NEGATIVE 05/22/2018 1228   PROTEINUR 30 (A) 05/22/2018  [place] 05/22/2018 [place] 05/22/2018 1228    Radiological Exams on Admission: Dg Chest 2 View  Result Date: 05/24/2018 CLINICAL DATA:  Shortness of breath, left chest pain. History of myeloma. EXAM: CHEST - 2 VIEW COMPARISON:  05/23/2018 FINDINGS: Right Port-A-Cath in place with the tip in the SVC, unchanged. Heart is normal size. No confluent airspace opacities or effusions. Diffuse lytic lesions throughout the bony skeleton compatible with myeloma. Multiple healing bilateral rib fractures. IMPRESSION: No acute cardiopulmonary disease. Diffuse lytic lesions throughout the bones compatible with myeloma with healing bilateral rib fractures Electronically Signed   By: Rolm Baptise M.D.   On: 05/24/2018 14:39   Nm Pulmonary Perf And Vent  Result Date: 05/23/2018 CLINICAL DATA:  Three days post appendectomy, chest pain, high pretest clinical probability for pulmonary embolism EXAM: NUCLEAR MEDICINE VENTILATION - PERFUSION LUNG SCAN TECHNIQUE: Ventilation images were obtained in multiple projections using inhaled aerosol Tc-25mDTPA. Perfusion images were obtained in multiple projections after intravenous injection of Tc-978mAA. RADIOPHARMACEUTICALS:  32 mCi of Tc-993mPA aerosol inhalation and 4.4 mCi Tc99m45m IV COMPARISON:  None Correlation: Chest radiograph 05/23/2018 FINDINGS: Ventilation: Normal Perfusion: Diminished perfusion at the posterior lung bases bilaterally. Observed pattern suggests presence of small bibasilar pleural effusions or minimal atelectasis. Portable chest radiograph demonstrates subsegmental atelectasis at LEFT base but no definite pleural effusions. No other perfusion defects identified. IMPRESSION: Low probability for pulmonary embolism. Electronically Signed   By: MarkLavonia Dana.    On: 05/23/2018 12:26   Us V[place]ous Img Lower Bilateral  Result Date: 05/23/2018 CLINICAL DATA:  Bilateral lower extremity pain and edema. Elevated D-dimer. History of multiple myeloma. Evaluate for DVT EXAM: BILATERAL LOWER EXTREMITY VENOUS DOPPLER ULTRASOUND TECHNIQUE: Gray-scale sonography with graded compression, as well as color Doppler and duplex ultrasound were performed to evaluate the lower extremity deep venous systems from the level of the common femoral vein and including the common femoral, femoral, profunda femoral, popliteal and calf veins including the posterior tibial, peroneal and gastrocnemius veins when visible. The superficial great saphenous vein was also interrogated. Spectral Doppler was utilized to evaluate flow at rest and with distal augmentation maneuvers in the common femoral, femoral and popliteal veins. COMPARISON:  None. FINDINGS: RIGHT LOWER EXTREMITY Common Femoral Vein: No evidence of thrombus. Normal compressibility, respiratory phasicity and response to augmentation. Saphenofemoral Junction: No evidence of thrombus. Normal compressibility and flow on color Doppler imaging. Profunda Femoral Vein: No evidence of thrombus. Normal compressibility and flow on color Doppler imaging. Femoral Vein: No evidence of thrombus. Normal compressibility, respiratory phasicity and response to augmentation. Popliteal Vein: No evidence of thrombus. Normal compressibility, respiratory phasicity and response to augmentation. Calf Veins: No evidence of thrombus. Normal compressibility and flow on color Doppler imaging. Superficial Great Saphenous Vein: No evidence of thrombus. Normal compressibility. Venous Reflux:  None. Other Findings:  None. LEFT LOWER EXTREMITY Common Femoral Vein: No evidence of thrombus. Normal compressibility, respiratory phasicity and response to augmentation. Saphenofemoral Junction: No evidence of thrombus. Normal compressibility and flow on color Doppler imaging. Profunda  Femoral Vein: No evidence of thrombus. Normal compressibility and flow on color Doppler imaging. Femoral Vein: No evidence of thrombus. Normal compressibility, respiratory phasicity and response to augmentation. Popliteal Vein: No evidence of thrombus. Normal compressibility, respiratory phasicity and response to augmentation. Calf Veins: No evidence of thrombus. Normal compressibility and flow on color Doppler imaging. Superficial Great Saphenous Vein: No evidence of thrombus. Normal compressibility. Venous Reflux:  None. Other Findings:  None. IMPRESSION: No evidence of DVT within either lower extremity. Electronically Signed   By: Sandi Mariscal M.D.   On: 05/23/2018 12:43   Dg Chest Port 1 View  Result Date: 05/23/2018 CLINICAL DATA:  Multiple myeloma.  Unexplained fever. EXAM: PORTABLE CHEST 1 VIEW COMPARISON:  05/22/2018.  05/20/2018.  05/11/2018. FINDINGS: Power port remains in place. Heart size is normal. Chronic interstitial lung markings are again evident. There is mild patchy atelectasis and or pneumonia at the left lung base. The remainder the chest is clear. Multiple lytic skeletal lesions again demonstrated. IMPRESSION: New small area of atelectasis or pneumonia in the left lower lobe. Chronic interstitial lung markings elsewhere. Chronic lytic bone lesions. Electronically Signed   By: Nelson Chimes M.D.   On: 05/23/2018 07:37    EKG: Independently reviewed.  Sinus rhythm.  QTC 461.  Assessment/Plan Active Problems:   Multiple myeloma not having achieved remission (HCC)   S/P laparoscopic appendectomy   SOB (shortness of breath)  Diarrhea-multiple episodes watery since [facility] discharge yesterday.  Recent hospitalization. Also SMOG enema 2 days ago.  No abdominal pain. S/p appendectomy.  WBC 11.8.  Creatinine at baseline.  Lactic acid 1.99 -Stool C. difficile -Hydrate RL +20 KCl 100cc/hr X 15 hrs.  Acute on chronic anemia- with mild thrombocytopenia- 111. Hemoglobin 7.8, recent baseline over  the past month ~9.  Reports dark watery stools.  Stool FOBT. Anemia likely related to multiple myeloma Vs chemotherapy Vs anemia of chronic disease. -CBC a.m -Iron studies in a.m -Follow-up as outpatient  Malaise, shortness of breath-likely related to recent appendectomy, diarrhea and anxiety.  On room air. Recent fever- 101, on recent admission, may be related to recent appendectomy, as no focus of infection identified then and now on repeat imaging. UA- pending.  -F/u repeat blood and urine cultures -Will hold off on broad-spectrum antibiotics considering no focus of infection identified, monitor closely as patient is immunocompromised. -Influenza check  Chest Pain- with point tenderness. Not pleuritic. Likely related to rib fractures from MM, with chest x-ray showing healing bilat rib fractures.  Was just ruled out for VTE 05/23/18, done for elevated d-dimer, V/Q and lower extremity Dopplers were negative.  Trop x1 unremarkable EKG unchanged. -Pain Control -Incentive spirometry -Trops x2  Hypokalemia- 3.1.  Likelyrelated to recent diarrhea. -Check magnesium- 1.4, replete -Replete -BMP, MAg a.m.  Multiple myeloma not in remission-follows with hematologist, Dr Delton Coombes.  -Continue home chemotherapy- Lenalidomide. - Continue home dexamethasone   CKD- Cr 2.08, at baseline. -BMP a.m.  HTN- Systolic [id number]. cont lisinopril.  Recent appendectomy- 10/1.  Abdomen soft.  -Dr. Arnoldo Morale, consulted 05/22/18-at that time no recommendations for repeat CT abdomen.  DVT prophylaxis: Scds for now, with reports of dark stool. Code Status: Full Family Communication: None at bedside Disposition Plan: 1-2 days Consults called: None Admission status: Obs, tele   Bethena Roys MD [facility] Pager [phone] From 3PM-11PM.  Otherwise please contact night-coverage [email or web address] Password [email or web address]  05/24/2018, 4:20 PM

## 2018-05-24 NOTE — ED Triage Notes (Signed)
Pt reports signing out AMA from hospital yesterday for pneumonia.  States she has not yet gotten her abx filled and just feels worse.

## 2018-05-25 DIAGNOSIS — C9000 Multiple myeloma not having achieved remission: Secondary | ICD-10-CM | POA: Diagnosis not present

## 2018-05-25 DIAGNOSIS — Z9049 Acquired absence of other specified parts of digestive tract: Secondary | ICD-10-CM

## 2018-05-25 DIAGNOSIS — R0602 Shortness of breath: Secondary | ICD-10-CM | POA: Diagnosis not present

## 2018-05-25 LAB — IRON AND TIBC
IRON: 77 ug/dL (ref 28–170)
Saturation Ratios: 60 % — ABNORMAL HIGH (ref 10.4–31.8)
TIBC: 128 ug/dL — AB (ref 250–450)
UIBC: 51 ug/dL

## 2018-05-25 LAB — BASIC METABOLIC PANEL
Anion gap: 4 — ABNORMAL LOW (ref 5–15)
BUN: 18 mg/dL (ref 8–23)
CHLORIDE: 109 mmol/L (ref 98–111)
CO2: 22 mmol/L (ref 22–32)
CREATININE: 1.95 mg/dL — AB (ref 0.44–1.00)
Calcium: 6.6 mg/dL — ABNORMAL LOW (ref 8.9–10.3)
GFR, EST AFRICAN AMERICAN: 29 mL/min — AB (ref >60)
GFR, EST NON AFRICAN AMERICAN: 25 mL/min — AB (ref >60)
Glucose, Bld: 129 mg/dL — ABNORMAL HIGH (ref 70–99)
POTASSIUM: 4 mmol/L (ref 3.5–5.1)
SODIUM: 135 mmol/L (ref 135–145)

## 2018-05-25 LAB — CBC
HCT: 21.6 % — ABNORMAL LOW (ref 36.0–46.0)
HEMOGLOBIN: 7.3 g/dL — AB (ref 12.0–15.0)
MCH: 31.5 pg (ref 26.0–34.0)
MCHC: 33.8 g/dL (ref 30.0–36.0)
MCV: 93.1 fL (ref 78.0–100.0)
Platelets: 131 10*3/uL — ABNORMAL LOW (ref 150–400)
RBC: 2.32 MIL/uL — ABNORMAL LOW (ref 3.87–5.11)
RDW: 16.5 % — ABNORMAL HIGH (ref 11.5–15.5)
WBC: 12.2 10*3/uL — ABNORMAL HIGH (ref 4.0–10.5)

## 2018-05-25 LAB — HEMOGLOBIN AND HEMATOCRIT, BLOOD
HCT: 27.2 % — ABNORMAL LOW (ref 36.0–46.0)
Hemoglobin: 9.3 g/dL — ABNORMAL LOW (ref 12.0–15.0)

## 2018-05-25 LAB — CULTURE, BLOOD (ROUTINE X 2)
CULTURE: NO GROWTH
CULTURE: NO GROWTH
SPECIAL REQUESTS: ADEQUATE
SPECIAL REQUESTS: ADEQUATE

## 2018-05-25 LAB — TROPONIN I: Troponin I: <0.03 ng/mL (ref <0.03)

## 2018-05-25 LAB — OCCULT BLOOD X 1 CARD TO LAB, STOOL: Fecal Occult Bld: NEGATIVE

## 2018-05-25 LAB — FERRITIN: FERRITIN: 1415 ng/mL — AB (ref 11–307)

## 2018-05-25 LAB — C DIFFICILE QUICK SCREEN W PCR REFLEX
C DIFFICILE (CDIFF) TOXIN: NEGATIVE
C Diff antigen: NEGATIVE
C Diff interpretation: NOT DETECTED

## 2018-05-25 LAB — MAGNESIUM: MAGNESIUM: 1.7 mg/dL (ref 1.7–2.4)

## 2018-05-25 LAB — PREPARE RBC (CROSSMATCH)

## 2018-05-25 MED ORDER — ZOLPIDEM TARTRATE 5 MG PO TABS
5.0000 mg | ORAL_TABLET | Freq: Once | ORAL | Status: AC
  Administered 2018-05-25: 5 mg via ORAL
  Filled 2018-05-25: qty 1

## 2018-05-25 MED ORDER — DIPHENHYDRAMINE-ZINC ACETATE 2-0.1 % EX CREA
TOPICAL_CREAM | Freq: Two times a day (BID) | CUTANEOUS | Status: DC | PRN
  Administered 2018-05-25: 22:00:00 via TOPICAL
  Filled 2018-05-25 (×2): qty 28

## 2018-05-25 MED ORDER — SODIUM CHLORIDE 0.9% IV SOLUTION
Freq: Once | INTRAVENOUS | Status: AC
  Administered 2018-05-25: 13:00:00 via INTRAVENOUS

## 2018-05-25 MED ORDER — DEXAMETHASONE 4 MG PO TABS: 40.0000 mg | ORAL_TABLET | Freq: Every day | ORAL | Status: DC

## 2018-05-25 MED ORDER — TRAZODONE HCL 50 MG PO TABS: 50.0000 mg | ORAL_TABLET | Freq: Every evening | ORAL | Status: DC | PRN

## 2018-05-25 MED ORDER — POTASSIUM CHLORIDE IN NACL 20-0.9 MEQ/L-% IV SOLN
INTRAVENOUS | Status: DC
  Administered 2018-05-25: 04:00:00 via INTRAVENOUS

## 2018-05-25 NOTE — Progress Notes (Signed)
PROGRESS NOTE  Alexandra Price  JQB:[id number]  DOB: 1946/06/26  DOA: 05/24/2018 PCP: Patient, No Pcp Per  Brief Admission Hx: Alexandra Price is a 72 y.o. female with medical history significant for Multiple myeloma, CKD3, who presented to the ED, with c/o SOB of 2 days, since recent hospitalization 05/22/18.  Patient reports shortness of breath is unchanged the past few days , change with exertion and at rest, and she actually feels it is more anxiety related.  She reports some mild chest pain to her left chest, that started last night, persistent, worse with movement, unchanged with deep breathing.  Patient was admitted 10/1, had an appendectomy 10/1.  Discharged home 10/2, presented to the ED 10/3 with complaints of fever body aches, also tachycardic, was admitted for fever of unknown source, placed on vancomycin, cefepime and Flagyl initially.  Patient had VQ scan and lower extremity Doppler both of which were negative for venous thromboembolism.  Blood cultures negative. Discharged 10/4.   MDM/Assessment & Plan:   1. Generalized weakness and malaise - pt reports that she developed loose stools since discharge yesterday. She has been started on IV fluid hydration.  Will ask for PT evaluation.  Flu test negative.  2. Pleuritic chest pain - Likely related to erosive lesions from her multiple myeloma disease.   3. Anemia of chronic disease - Stool hemoccult negative x 2.  Transfuse 2 units of PRBC 10/6. Recheck CBC in AM.  4. Hypokalemia - repleted. Check magnesium.   5. Multiple myeloma not in remission - Pt was followed by hematologist Dr. Delton Coombes. According to notes she has been advised to temporarily hold her lenalidomide/dexamethasone.   6. CKD stage 3 - currently at baseline, monitoring.  7. Essential hypertension - continue lisinopril daily.  8. Post op s/p recent appendectomy 05/20/18.  Spoke with Dr Arnoldo Morale 05/22/18-at that time no recommendations for repeat CT abdomen.  DVT  prophylaxis: SCDs Code Status: Full  Family Communication: patient  Disposition Plan: PT evaluation requested  Subjective: Pt reports that she has had loose stools since she left hospital yesterday.    Objective: Vitals:   05/24/18 2104 05/25/18 0618 05/25/18 1309 05/25/18 1338  BP: (!) 112/44 (!) 143/69 (!) 143/69 (!) 148/71  Pulse: 99 (!) 105 91 92  Resp: _0 Temp: 99 F (37.2 C) 98.8 F (37.1 C) 98.9 F (37.2 C) 98.3 F (36.8 C)  TempSrc: Oral Oral Oral Oral  SpO2: 98% 98% 99% 100%  Weight:      Height:        Intake/Output Summary (Last 24 hours) at 05/25/2018 1433 Last data filed at 05/25/2018 0900 Gross per 24 hour  Intake 1258.17 ml  Output 1000 ml  Net 258.17 ml   Filed Weights   05/24/18 1319  Weight: 72 kg   REVIEW OF SYSTEMS  As per history otherwise all reviewed and reported negative  Exam:  Eyes: PERRL, lids and conjunctivae normal.   ENMT: Mucous membranes are moist. Posterior pharynx clear of any exudate or lesions. Neck: normal, supple, no masses, no thyromegaly Respiratory: clear to auscultation bilaterally, no wheezing, no crackles. Normal respiratory effort. No accessory muscle use.  Cardiovascular: norma. 2+ pedal pulses. Point tenderness left lower rib, tender to palpation.  Port- a cath- right upper chest, site clean, no surrounding erythema or tenderness.  Abdomen: no tenderness, soft, clean Band-Aid dressing on abdomen, no masses palpated. No hepatosplenomegaly. Bowel sounds positive.  Musculoskeletal: no clubbing / cyanosis. No joint deformity  upper and lower extremities. Good ROM, no contractures. Normal muscle tone.  Skin: Seborrheic keratosis on back, lesions, ulcers. No induration Neurologic: CN 2-12 grossly intact. sensation intact, Strength 5/5 in all 4.  Psychiatric: Normal judgment and insight. Alert and oriented x 3.  Sad affect.   Data Reviewed: Basic Metabolic Panel: Recent Labs  Lab 05/21/18 0356 05/22/18 1012  05/23/18 0511 05/24/18 1421 05/25/18 0343  NA 132* 132* 137 134* 135  K 4.6 4.0 4.3 3.1* 4.0  CL 100 99 105 104 109  CO2 20* 22 23 21* 22  GLUCOSE 340* 149* 165* 236* 129*  BUN 26* _0 CREATININE 2.32* 2.28* 2.20* 2.08* 1.95*  CALCIUM 6.7* 6.7* 6.5* 7.0* 6.6*  MG  --   --   --  1.4* 1.7   Liver Function Tests: Recent Labs  Lab 05/20/18 1058 05/22/18 1012 05/23/18 0511 05/24/18 1421  AST _1 ALT 21 38 48* 46*  ALKPHOS 150* 122 128* 125  BILITOT 0.5 0.5 0.6 0.7  PROT 7.8 6.9 6.3* 5.9*  ALBUMIN 3.0* 2.6* 2.5* 2.4*   Recent Labs  Lab 05/22/18 1036  LIPASE 25   No results for input(s): AMMONIA in the last 168 hours. CBC: Recent Labs  Lab 05/20/18 1058 05/21/18 0356 05/22/18 1012 05/23/18 0511 05/24/18 1421 05/25/18 0343  WBC 14.8* 15.0* 12.7* 13.6* 11.8* 12.2*  NEUTROABS 13.7  --  10.8 11.1* 8.2*  --   HGB 9.0* 7.9* 8.6* 8.6* 7.8* 7.3*  HCT 27.7* 24.2* 26.0* 26.7* 23.7* 21.6*  MCV 93.6 94.2 92.9 94.7 92.6 93.1  PLT 170 147* 133* 125* 111* 131*   Cardiac Enzymes: Recent Labs  Lab 05/22/18 1457 05/23/18 0511 05/24/18 1421 05/24/18 2031 05/25/18 0342  CKTOTAL 18* 14*  --   --   --   TROPONINI  --   --  <0.03 <0.03 <0.03   CBG (last 3)  No results for input(s): GLUCAP in the last 72 hours. Recent Results (from the past 240 hour(s))  Urine culture     Status: Abnormal   Collection Time: 05/20/18 10:40 AM  Result Value Ref Range Status   Specimen Description   Final    URINE, CLEAN CATCH Performed at [facility], [address], [place], [place] [address]    Special Requests   Final    Normal Performed at [facility], [address], [place], [place] [address]    Culture >=100,000 COLONIES/mL PSEUDOMONAS AERUGINOSA (A)  Final   Report Status 05/23/2018 FINAL  Final   Organism ID, Bacteria PSEUDOMONAS AERUGINOSA (A)  Final      Susceptibility   Pseudomonas aeruginosa - MIC*    CEFTAZIDIME 4 SENSITIVE Sensitive     CIPROFLOXACIN  <=0.25 SENSITIVE Sensitive     GENTAMICIN <=1 SENSITIVE Sensitive     IMIPENEM 1 SENSITIVE Sensitive     PIP/TAZO 8 SENSITIVE Sensitive     CEFEPIME 2 SENSITIVE Sensitive     * >=100,000 COLONIES/mL PSEUDOMONAS AERUGINOSA  Blood Culture (routine x 2)     Status: None (Preliminary result)   Collection Time: 05/20/18 10:58 AM  Result Value Ref Range Status   Specimen Description BLOOD LEFT HAND  Final   Special Requests   Final    BOTTLES DRAWN AEROBIC AND ANAEROBIC Blood Culture adequate volume   Culture   Final    NO GROWTH 4 DAYS Performed at [facility], [address] [place], [place] [address]    Report Status PENDING  Incomplete  Blood Culture (routine x 2)     Status: None (Preliminary result)   Collection Time: 05/20/18 11:06 AM  Result Value Ref Range Status   Specimen Description BLOOD LEFT HAND  Final   Special Requests   Final    BOTTLES DRAWN AEROBIC AND ANAEROBIC Blood Culture adequate volume   Culture   Final    NO GROWTH 4 DAYS Performed at [facility], [address] [place], [place] [address]    Report Status PENDING  Incomplete  MRSA PCR Screening     Status: Abnormal   Collection Time: 05/20/18  5:31 PM  Result Value Ref Range Status   MRSA by PCR POSITIVE (A) NEGATIVE Final    Comment:        The GeneXpert MRSA Assay (FDA approved for NASAL specimens only), is one component of a comprehensive MRSA colonization surveillance program. It is not intended to diagnose MRSA infection nor to guide or monitor treatment for MRSA infections. RESULT CALLED TO, READ BACK BY AND VERIFIED WITH: AMBURN,A AT 0653 BY HUFFINES,S ON 05/21/18. Performed at [facility], [address], [place], [place] [address]   Culture, blood (Routine x 2)     Status: None (Preliminary result)   Collection Time: 05/22/18 10:12 AM  Result Value Ref Range Status   Specimen Description BLOOD LEFT HAND DRAWN BY RN  Final   Special Requests   Final    BOTTLES DRAWN AEROBIC AND  ANAEROBIC Blood Culture results may not be optimal due to an excessive volume of blood received in culture bottles   Culture   Final    NO GROWTH 2 DAYS Performed at [facility], [address], [place], [place] [address]    Report Status PENDING  Incomplete  Culture, blood (Routine x 2)     Status: None (Preliminary result)   Collection Time: 05/22/18 10:12 AM  Result Value Ref Range Status   Specimen Description BLOOD RIGHT HAND  Final   Special Requests   Final    BOTTLES DRAWN AEROBIC AND ANAEROBIC Blood Culture adequate volume   Culture   Final    NO GROWTH 2 DAYS Performed at [facility], [address] [place], [place] [address]    Report Status PENDING  Incomplete  Urine culture     Status: None   Collection Time: 05/22/18 12:28 PM  Result Value Ref Range Status   Specimen Description   Final    URINE, CATHETERIZED Performed at [facility], [address] [place], [place] [address]    Special Requests   Final    Normal Performed at [facility], [address] [place], [place] [address]    Culture   Final    NO GROWTH Performed at [facility], [address] [place], [place] [address]    Report Status 05/24/2018 FINAL  Final  C difficile quick scan w PCR reflex     Status: None   Collection Time: 05/25/18  8:00 AM  Result Value Ref Range Status   C Diff antigen NEGATIVE NEGATIVE Final   C Diff toxin NEGATIVE NEGATIVE Final   C Diff interpretation No C. difficile detected.  Final    Comment: Performed at [facility], [address], [place], [place] [address]     Studies: Dg Chest 2 View  Result Date: 05/24/2018 CLINICAL DATA:  Shortness of breath, left chest pain. History of myeloma. EXAM: CHEST - 2 VIEW COMPARISON:  05/23/2018 FINDINGS: Right Port-A-Cath in place with  the tip in the SVC, unchanged. Heart is normal size. No confluent airspace opacities or effusions. Diffuse lytic lesions throughout the bony skeleton compatible with myeloma. Multiple  healing bilateral rib fractures. IMPRESSION: No acute cardiopulmonary disease. Diffuse lytic lesions throughout the bones compatible with myeloma with healing bilateral rib fractures Electronically Signed   By: Rolm Baptise M.D.   On: 05/24/2018 14:39   Scheduled Meds: . acyclovir  400 mg Oral BID  . allopurinol  300 mg Oral Daily  . feeding supplement (ENSURE ENLIVE)  237 mL Oral BID BM  . HYDROcodone-acetaminophen  1 tablet Oral BID  . lisinopril  10 mg Oral Daily   Continuous Infusions: . 0.9 % NaCl with KCl 20 mEq / L 50 mL/hr at 05/25/18 2536    Active Problems:   Multiple myeloma not having achieved remission (HCC)   S/P laparoscopic appendectomy   SOB (shortness of breath)  Time spent:   Irwin Brakeman, MD, FAAFP [facility] Pager [phone]  If 7PM-7AM, please contact night-coverage [email or web address] Password [email or web address] 05/25/2018, 2:33 PM    LOS: 0 days '

## 2018-05-25 NOTE — Progress Notes (Signed)
PRBC infusing with no adverse reaction.  Rash on back was present on admission last night.  Talking on phone at this time.

## 2018-05-25 NOTE — Progress Notes (Signed)
Second unit of PRBC began infusing at 1629 and at around 1740 complained of not feeling well.  Vitals were 99.5, up from 99.4 at 1644.  BP  153/59 up from 144/77.  Resp 16, no SOB, sweating, or new rash.  C/O abd pain and nausea which she thinks is related to her recent abd surgery.  Received tylenol and zofran.

## 2018-05-26 ENCOUNTER — Observation Stay (HOSPITAL_COMMUNITY): Payer: Medicare Other

## 2018-05-26 DIAGNOSIS — R079 Chest pain, unspecified: Secondary | ICD-10-CM | POA: Diagnosis present

## 2018-05-26 DIAGNOSIS — R0602 Shortness of breath: Secondary | ICD-10-CM | POA: Diagnosis not present

## 2018-05-26 DIAGNOSIS — D630 Anemia in neoplastic disease: Secondary | ICD-10-CM | POA: Diagnosis present

## 2018-05-26 DIAGNOSIS — C9000 Multiple myeloma not having achieved remission: Secondary | ICD-10-CM | POA: Diagnosis present

## 2018-05-26 DIAGNOSIS — N390 Urinary tract infection, site not specified: Secondary | ICD-10-CM | POA: Diagnosis present

## 2018-05-26 DIAGNOSIS — Z7982 Long term (current) use of aspirin: Secondary | ICD-10-CM | POA: Diagnosis not present

## 2018-05-26 DIAGNOSIS — I129 Hypertensive chronic kidney disease with stage 1 through stage 4 chronic kidney disease, or unspecified chronic kidney disease: Secondary | ICD-10-CM | POA: Diagnosis present

## 2018-05-26 DIAGNOSIS — Z9221 Personal history of antineoplastic chemotherapy: Secondary | ICD-10-CM | POA: Diagnosis not present

## 2018-05-26 DIAGNOSIS — R000 Tachycardia, unspecified: Secondary | ICD-10-CM | POA: Diagnosis present

## 2018-05-26 DIAGNOSIS — A498 Other bacterial infections of unspecified site: Secondary | ICD-10-CM | POA: Diagnosis not present

## 2018-05-26 DIAGNOSIS — F329 Major depressive disorder, single episode, unspecified: Secondary | ICD-10-CM | POA: Diagnosis present

## 2018-05-26 DIAGNOSIS — F419 Anxiety disorder, unspecified: Secondary | ICD-10-CM | POA: Diagnosis present

## 2018-05-26 DIAGNOSIS — Z9049 Acquired absence of other specified parts of digestive tract: Secondary | ICD-10-CM | POA: Diagnosis not present

## 2018-05-26 DIAGNOSIS — B965 Pseudomonas (aeruginosa) (mallei) (pseudomallei) as the cause of diseases classified elsewhere: Secondary | ICD-10-CM | POA: Diagnosis present

## 2018-05-26 DIAGNOSIS — N183 Chronic kidney disease, stage 3 (moderate): Secondary | ICD-10-CM | POA: Diagnosis present

## 2018-05-26 DIAGNOSIS — D696 Thrombocytopenia, unspecified: Secondary | ICD-10-CM | POA: Diagnosis present

## 2018-05-26 DIAGNOSIS — E876 Hypokalemia: Secondary | ICD-10-CM | POA: Diagnosis present

## 2018-05-26 DIAGNOSIS — Z79899 Other long term (current) drug therapy: Secondary | ICD-10-CM | POA: Diagnosis not present

## 2018-05-26 DIAGNOSIS — Z885 Allergy status to narcotic agent status: Secondary | ICD-10-CM | POA: Diagnosis not present

## 2018-05-26 LAB — COMPREHENSIVE METABOLIC PANEL
ALT: 47 U/L — AB (ref 0–44)
AST: 25 U/L (ref 15–41)
Albumin: 2.4 g/dL — ABNORMAL LOW (ref 3.5–5.0)
Alkaline Phosphatase: 169 U/L — ABNORMAL HIGH (ref 38–126)
Anion gap: 6 (ref 5–15)
BUN: 16 mg/dL (ref 8–23)
CHLORIDE: 105 mmol/L (ref 98–111)
CO2: 23 mmol/L (ref 22–32)
CREATININE: 1.72 mg/dL — AB (ref 0.44–1.00)
Calcium: 6.6 mg/dL — ABNORMAL LOW (ref 8.9–10.3)
GFR calc non Af Amer: 29 mL/min — ABNORMAL LOW (ref >60)
GFR, EST AFRICAN AMERICAN: 34 mL/min — AB (ref >60)
Glucose, Bld: 172 mg/dL — ABNORMAL HIGH (ref 70–99)
POTASSIUM: 4.4 mmol/L (ref 3.5–5.1)
SODIUM: 134 mmol/L — AB (ref 135–145)
Total Bilirubin: 0.6 mg/dL (ref 0.3–1.2)
Total Protein: 5.7 g/dL — ABNORMAL LOW (ref 6.5–8.1)

## 2018-05-26 LAB — BPAM RBC
BLOOD PRODUCT EXPIRATION DATE: 201910242359
Blood Product Expiration Date: 201910242359
ISSUE DATE / TIME: 201910061316
ISSUE DATE / TIME: 201910061620
Unit Type and Rh: 600
Unit Type and Rh: 600

## 2018-05-26 LAB — TYPE AND SCREEN
ABO/RH(D): A NEG
ANTIBODY SCREEN: NEGATIVE
UNIT DIVISION: 0
Unit division: 0

## 2018-05-26 LAB — URINE CULTURE: CULTURE: NO GROWTH

## 2018-05-26 LAB — MAGNESIUM: Magnesium: 1.4 mg/dL — ABNORMAL LOW (ref 1.7–2.4)

## 2018-05-26 MED ORDER — CIPROFLOXACIN HCL 250 MG PO TABS: 250.0000 mg | ORAL_TABLET | Freq: Two times a day (BID) | ORAL | 0 refills | Status: DC

## 2018-05-26 MED ORDER — DIPHENOXYLATE-ATROPINE 2.5-0.025 MG PO TABS
1.0000 | ORAL_TABLET | Freq: Four times a day (QID) | ORAL | Status: DC | PRN
  Administered 2018-05-26: 2 via ORAL
  Filled 2018-05-26: qty 2

## 2018-05-26 MED ORDER — MAGNESIUM SULFATE 2 GM/50ML IV SOLN
2.0000 g | Freq: Once | INTRAVENOUS | Status: AC
  Administered 2018-05-26: 2 g via INTRAVENOUS
  Filled 2018-05-26: qty 50

## 2018-05-26 MED ORDER — DIPHENHYDRAMINE HCL 25 MG PO CAPS
25.0000 mg | ORAL_CAPSULE | Freq: Four times a day (QID) | ORAL | Status: DC | PRN
  Administered 2018-05-26: 25 mg via ORAL
  Filled 2018-05-26: qty 1

## 2018-05-26 MED ORDER — SODIUM CHLORIDE 0.9 % IV SOLN
2.0000 g | Freq: Once | INTRAVENOUS | Status: AC
  Administered 2018-05-26: 2 g via INTRAVENOUS
  Filled 2018-05-26: qty 20

## 2018-05-26 NOTE — Progress Notes (Signed)
Nutrition Brief Note  Patient identified on the Malnutrition Screening Tool (MST) Report  Patient has been in and out of the hospital multiple times in the past week. Recent appendectomy 10/1 and currently being treated at [facility] for multiple myeloma. Weight changes noted below.   Wt Readings from Last 15 Encounters:  05/24/18 72 kg  05/22/18 72.1 kg  05/21/18 75 kg  05/15/18 72.3 kg  05/12/18 74 kg  05/11/18 73.5 kg  05/08/18 73.7 kg  05/02/18 75.5 kg  05/01/18 73.5 kg  04/28/18 75.8 kg  04/24/18 75.1 kg  04/17/18 76.7 kg  04/15/18 75.3 kg  04/10/18 76.2 kg  04/10/18 76.2 kg    Body mass index is 26.41 kg/m. Patient meets criteria for overweight based on current BMI.   History of marginal po intake per outpatient RD note 9/27.  Current diet order is Heart Healthy, patient is consuming approximately 50-100% of meals at this time.   Labs and medications reviewed. Cr. Improving.  BMP Latest Ref Rng & Units 05/26/2018 05/25/2018 05/24/2018  Glucose 70 - 99 mg/dL 172(H) 129(H) 236(H)  BUN 8 - 23 mg/dL 16 18 22   Creatinine 0.44 - 1.00 mg/dL 1.72(H) 1.95(H) 2.08(H)  Sodium 135 - 145 mmol/L 134(L) 135 134(L)  Potassium 3.5 - 5.1 mmol/L 4.4 4.0 3.1(L)  Chloride 98 - 111 mmol/L 105 109 104  CO2 22 - 32 mmol/L 23 22 21(L)  Calcium 8.9 - 10.3 mg/dL 6.6(L) 6.6(L) 7.0(L)    Plan: Ensure Enlive po BID, each supplement provides 350 kcal and 20 grams of protein  If nutrition issues arise, please consult RD. Patient says she is likely leaving later today after her chest x-ray. She has been consuming vanilla Ensure at home. Recommend she continue daily 1-2 based on her oral intake. Goal is to re-gain/ maintain usual weight around 160-162 lb.   Colman Cater MS,RD,CSG,LDN Office: [phone] Pager: [phone]

## 2018-05-26 NOTE — Discharge Instructions (Signed)
Seek medical care or return to emergency department if symptoms come back, worsen or new problems develop. Please have your labs checked with your primary care provider or oncologist in 1 to 2 weeks. Please go to pharmacy and pick up prescription for ciprofloxacin that you will take for a urine infection.  DON'T GET THE PRESCRIPTION FOR DOXYCYCLINE FILLED.   PLEASE HAVE YOUR CBC RECHECKED WITH YOUR ONCOLOGIST.     Dehydration, Adult Dehydration is when there is not enough fluid or water in your body. This happens when you lose more fluids than you take in. Dehydration can range from mild to very bad. It should be treated right away to keep it from getting very bad. Symptoms of mild dehydration may include:  Thirst.  Dry lips.  Slightly dry mouth.  Dry, warm skin.  Dizziness. Symptoms of moderate dehydration may include:  Very dry mouth.  Muscle cramps.  Dark pee (urine). Pee may be the color of tea.  Your body making less pee.  Your eyes making fewer tears.  Heartbeat that is uneven or faster than normal (palpitations).  Headache.  Light-headedness, especially when you stand up from sitting.  Fainting (syncope). Symptoms of very bad dehydration may include:  Changes in skin, such as: ? Cold and clammy skin. ? Blotchy (mottled) or pale skin. ? Skin that does not quickly return to normal after being lightly pinched and let go (poor skin turgor).  Changes in body fluids, such as: ? Feeling very thirsty. ? Your eyes making fewer tears. ? Not sweating when body temperature is high, such as in hot weather. ? Your body making very little pee.  Changes in vital signs, such as: ? Weak pulse. ? Pulse that is more than 100 beats a minute when you are sitting still. ? Fast breathing. ? Low blood pressure.  Other changes, such as: ? Sunken eyes. ? Cold hands and feet. ? Confusion. ? Lack of energy (lethargy). ? Trouble waking up from sleep. ? Short-term weight  loss. ? Unconsciousness. Follow these instructions at home:  If told by your doctor, drink an ORS: ? Make an ORS by using instructions on the package. ? Start by drinking small amounts, about  cup (120 mL) every 5-10 minutes. ? Slowly drink more until you have had the amount that your doctor said to have.  Drink enough clear fluid to keep your pee clear or pale yellow. If you were told to drink an ORS, finish the ORS first, then start slowly drinking clear fluids. Drink fluids such as: ? Water. Do not drink only water by itself. Doing that can make the salt (sodium) level in your body get too low (hyponatremia). ? Ice chips. ? Fruit juice that you have added water to (diluted). ? Low-calorie sports drinks.  Avoid: ? Alcohol. ? Drinks that have a lot of sugar. These include high-calorie sports drinks, fruit juice that does not have water added, and soda. ? Caffeine. ? Foods that are greasy or have a lot of fat or sugar.  Take over-the-counter and prescription medicines only as told by your doctor.  Do not take salt tablets. Doing that can make the salt level in your body get too high (hypernatremia).  Eat foods that have minerals (electrolytes). Examples include bananas, oranges, potatoes, tomatoes, and spinach.  Keep all follow-up visits as told by your doctor. This is important. Contact a doctor if:  You have belly (abdominal) pain that: ? Gets worse. ? Stays in one area (localizes).  You have a rash.  You have a stiff neck.  You get angry or annoyed more easily than normal (irritability).  You are more sleepy than normal.  You have a harder time waking up than normal.  You feel: ? Weak. ? Dizzy. ? Very thirsty.  You have peed (urinated) only a small amount of very dark pee during 6-8 hours. Get help right away if:  You have symptoms of very bad dehydration.  You cannot drink fluids without throwing up (vomiting).  Your symptoms get worse with  treatment.  You have a fever.  You have a very bad headache.  You are throwing up or having watery poop (diarrhea) and it: ? Gets worse. ? Does not go away.  You have blood or something green (bile) in your throw-up.  You have blood in your poop (stool). This may cause poop to look black and tarry.  You have not peed in 6-8 hours.  You pass out (faint).  Your heart rate when you are sitting still is more than 100 beats a minute.  You have trouble breathing. This information is not intended to replace advice given to you by your health care provider. Make sure you discuss any questions you have with your health care provider. Document Released: 06/02/2009 Document Revised: 02/24/2016 Document Reviewed: 09/30/2015 Elsevier Interactive Patient Education  2018 [employer].   Leukocytosis Leukocytosis means that a person has more white blood cells than normal. White blood cells are made in the bone marrow. Bone marrow is the spongy tissue inside of bones. The main job of white blood cells is to fight infection. Having too many white blood cells is a common condition. It can develop as a result of many types of medical problems. What are the causes? This condition may be caused by various problems. In some cases, the bone marrow is normal but it is still making too many white blood cells. This could be the result of:  Infection.  Injury.  Physical stress.  Emotional stress.  Surgery.  Allergic reactions.  Tumors that do not start in the blood or bone marrow.  An inherited disease.  Certain medicines.  Pregnancy and labor.  In other cases, a person may have a bone marrow disorder that is causing the body to make too many white blood cells. Bone marrow disorders include:  Leukemia. This is a type of blood cancer.  Myeloproliferative disorders. These disorders cause blood cells to grow abnormally.  What are the signs or symptoms? Often, this condition causes no  symptoms. Some people may have symptoms due to the medical problem that is causing their leukocytosis. These symptoms may include:  Bleeding.  Bruising.  Fever.  Night sweats.  Repeated infections.  Weakness.  Weight loss.  How is this diagnosed? This condition is diagnosed with blood tests. It is often found when blood is tested as part of a routine physical exam. You may have other tests to help determine why you have too many white blood cells. These tests may include:  A complete blood count (CBC). This test measures all the types of blood cells in your body.  Chest X-rays, urine tests, or other tests to look for signs of infection.  Bone marrow aspiration. For this test, a needle is put into your bone. Cells from the bone marrow are removed through the needle, then they are examined under a microscope.  Other tests on the blood or bone marrow sample.  How is this treated? Usually, treatment is  not needed for leukocytosis. However, if a disorder is causing your leukocytosis, it will need to be treated. Treatment may include:  Antibiotic medicine if you have a bacterial infection.  Bone marrow transplant. This treatment replaces your diseased bone marrow with healthy cells that will grow new bone marrow.  Chemotherapy or biological therapies such as the use of antibodies. These treatments may be used to kill cancer cells or to decrease the number of white blood cells.  Follow these instructions at home: Medicines  Take over-the-counter and prescription medicines only as told by your health care provider.  If you were prescribed an antibiotic medicine, take it as told by your health care provider. Do not stop taking the antibiotic even if you start to feel better. Eating and drinking  Eat foods that are low in saturated fats and high in fiber. Eat plenty of fruits and vegetables.  Drink enough fluid to keep your urine clear or pale yellow.  Limit your intake of  caffeine and alcohol. General instructions  Maintain a healthy weight. Ask your health care provider what weight is best for you.  Do 30 minutes of exercise at least 5 times each week. Check with your health care provider before you start a new exercise routine.  Do not use tobacco products, including cigarettes, chewing tobacco, or e-cigarettes. If you need help quitting, ask your health care provider.  Keep all follow-up visits as told by your health care provider. This is important. Contact a health care provider if:  You feel weak or more tired than usual.  You develop chills, a cough, or nasal congestion.  You have a fever.  You lose weight without trying.  You have night sweats.  You bruise easily. Get help right away if:  You bleed more than normal.  You have chest pain.  You have trouble breathing.  You have uncontrolled nausea or vomiting.  You feel dizzy or light-headed. This information is not intended to replace advice given to you by your health care provider. Make sure you discuss any questions you have with your health care provider. Document Released: 07/26/2011 Document Revised: 01/12/2016 Document Reviewed: 02/07/2015 Elsevier Interactive Patient Education  2018 [employer].   Urinary Tract Infection, Adult A urinary tract infection (UTI) is an infection of any part of the urinary tract. The urinary tract includes the:  Kidneys.  Ureters.  Bladder.  Urethra.  These organs make, store, and get rid of pee (urine) in the body. Follow these instructions at home:  Take over-the-counter and prescription medicines only as told by your doctor.  If you were prescribed an antibiotic medicine, take it as told by your doctor. Do not stop taking the antibiotic even if you start to feel better.  Avoid the following drinks: ? Alcohol. ? Caffeine. ? Tea. ? Carbonated drinks.  Drink enough fluid to keep your pee clear or pale yellow.  Keep all  follow-up visits as told by your doctor. This is important.  Make sure to: ? Empty your bladder often and completely. Do not to hold pee for long periods of time. ? Empty your bladder before and after sex. ? Wipe from front to back after a bowel movement if you are female. Use each tissue one time when you wipe. Contact a doctor if:  You have back pain.  You have a fever.  You feel sick to your stomach (nauseous).  You throw up (vomit).  Your symptoms do not get better after 3 days.  Your  symptoms go away and then come back. Get help right away if:  You have very bad back pain.  You have very bad lower belly (abdominal) pain.  You are throwing up and cannot keep down any medicines or water. This information is not intended to replace advice given to you by your health care provider. Make sure you discuss any questions you have with your health care provider. Document Released: 01/23/2008 Document Revised: 01/12/2016 Document Reviewed: 06/27/2015 Elsevier Interactive Patient Education  [employer].

## 2018-05-26 NOTE — Care Management Note (Signed)
Case Management Note  Patient Details  Name: Alexandra Price MRN: [id number] Date of Birth: 03/15/1946  Subjective/Objective:                 Admitted with SOB. Pt from home, lives with son and ind with ADL's. Pt has insurance with drug coverage. She left AMA on Friday and returned Saturday. She did not get abx filled after DC because she came back in before she could get to pharmacy.    Action/Plan: CM made account note indicating pt is insured. Pt plans to return home with self care. She is able to afford medications. She follows closely with the [facility].   Expected Discharge Date:    05/28/18              Expected Discharge Plan:  Home/Self Care  In-House Referral:  NA  Discharge planning Services  CM Consult  Post Acute Care [facility] Status of Service:  In process, will continue to follow  Sherald Barge, RN 05/26/2018, 12:28 PM

## 2018-05-26 NOTE — Evaluation (Signed)
Physical Therapy Evaluation Patient Details Name: Alexandra Price MRN: [id number] DOB: [birth year] Today's Date: 05/26/2018   History of Present Illness  Alexandra Price is a 72yo female who comes to [facility] on 10/5 c SOB. PMH: multiple myeloma currently getting chemotherapy, chronic macrocytic anemia, recent appendectomy, and CKD3.   Clinical Impression  Pt admitted with above diagnosis. Pt currently with functional limitations due to the deficits listed below (see "PT Problem List"). Upon entry, pt in bed, no family/caregiver present. The pt is awake and agreeable to participate.  Functional mobility assessment demonstrates mild increased time requirements, but is tolerated well,and without frank need for physical assistance, whereas the patient performed these at a similar level of independence PTA.   Patient is at baseline, all education completed, and time is given to address all questions/concerns. No additional skilled PT services needed at this time, PT signing off. PT recommends daily ambulation ad lib or with nursing staff as needed to prevent deconditioning.      Follow Up Recommendations No PT follow up    Equipment Recommendations  None recommended by PT    Recommendations for Other Services       Precautions / Restrictions Precautions Precautions: None Restrictions Weight Bearing Restrictions: No      Mobility  Bed Mobility Overal bed mobility: Independent                Transfers Overall transfer level: Independent                  Ambulation/Gait Ambulation/Gait assistance: Modified independent (Device/Increase time) Gait Distance (Feet): 400 Feet Assistive device: None Gait Pattern/deviations: WFL(Within Functional Limits) Gait velocity: 0.17ms  Gait velocity interpretation: 1.31 - 2.62 ft/sec, indicative of limited community ambulator General Gait Details: mild antalgia  Stairs            Wheelchair Mobility    Modified Rankin (Stroke  Patients Only)       Balance Overall balance assessment: Mild deficits observed, not formally tested;Modified Independent                                           Pertinent Vitals/Pain Pain Assessment: (itchy(recently had benedryl) )    Home Living Family/patient expects to be discharged to:: Private residence Living Arrangements: Children Available Help at Discharge: Family Type of Home: Mobile home Home Access: Ramped entrance     Home Layout: One level Home Equipment: WEnvironmental consultant- 2 wheels;Cane - single point;Shower seat - built in      Prior Function Level of Independence: Independent         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, newspaper       Extremity/Trunk Assessment   Upper Extremity Assessment Upper Extremity Assessment: Overall WFL for tasks assessed    Lower Extremity Assessment Lower Extremity Assessment: Overall WFL for tasks assessed       Communication   Communication: No difficulties  Cognition Arousal/Alertness: Awake/alert Behavior During Therapy: WFL for tasks assessed/performed Overall Cognitive Status: Within Functional Limits for tasks assessed                                        General Comments      Exercises     Assessment/Plan    PT  Assessment Patent does not need any further PT services  PT Problem List         PT Treatment Interventions      PT Goals (Current goals can be found in the Care Plan section)  Acute Rehab PT Goals PT Goal Formulation: All assessment and education complete, DC therapy    Frequency     Barriers to discharge        Co-evaluation               AM-PAC PT "6 Clicks" Daily Activity  Outcome Measure Difficulty turning over in bed (including adjusting bedclothes, sheets and blankets)?: None Difficulty moving from lying on back to sitting on the side of the bed? : None Difficulty sitting down on and standing up from a chair  with arms (e.g., wheelchair, bedside commode, etc,.)?: None Help needed moving to and from a bed to chair (including a wheelchair)?: None Help needed walking in hospital room?: None Help needed climbing 3-5 steps with a railing? : None 6 Click Score: 24    End of Session   Activity Tolerance: Patient tolerated treatment well Patient left: with call bell/phone within reach;with family/visitor present;in chair Nurse Communication: Mobility status PT Visit Diagnosis: Unsteadiness on feet (R26.81);Other abnormalities of gait and mobility (R26.89);Muscle weakness (generalized) (M62.81)    Time: 9728-2060 PT Time Calculation (min) (ACUTE ONLY): 11 min   Charges:   PT Evaluation $PT Eval Low Complexity: 1 Low         4:21 PM, 05/26/18 Etta Grandchild, PT, DPT Physical Therapist - [place] [phone] (Office)    Buccola,Allan C 05/26/2018, 4:19 PM

## 2018-05-26 NOTE — Discharge Summary (Signed)
Physician Discharge Summary  MACKENA PLUMMER KKX:[id number] DOB: Nov 08, 1945 DOA: 05/24/2018  PCP: Patient, No Pcp Per Oncologist: Dr. Worthy Keeler  Admit date: 05/24/2018 Discharge date: 05/26/2018  Admitted From: Home Disposition: Home   Recommendations for Outpatient Follow-up:  1. Follow up with oncologist in 2 days as already scheduled  Discharge Condition: STABLE   CODE STATUS: FULL    Brief Hospitalization Summary: Please see all hospital notes, images, labs for full details of the hospitalization. Dr. Denton Brick HPI: LASHAUNA ARPIN is a 72 y.o. female with medical history significant for Multiple myeloma, CKD3, who presented to the ED, with c/o SOB of 2 days, since recent hospitalization 05/22/18.  Patient reports shortness of breath is unchanged the past few days , change with exertion and at rest, and she actually feels it is more anxiety related.  She reports some mild chest pain to her left chest, that started last night, persistent, worse with movement, unchanged with deep breathing.  She denies trauma or fall.   Patient was admitted 10/1, had an appendectomy 10/1.  Discharged home 10/2, presented to the ED 10/3 with complaints of fever body aches, also tachycardic, was admitted for fever of unknown source, placed on vancomycin, cefepime and Flagyl initially.  T-max on admission 101.  Patient had VQ scan and lower extremity Doppler both of which were negative for venous thromboembolism.  Blood cultures still negative. Patient left the hospital AMA 10/4.  Was sent a prescription for doxycycline which she has not been able to pick up.  But last night and this morning patient reports she felt worse, hence presentation to the ED.  She reports maximum temps-  T- 99 at home.  No dysuria, no headache or neck pain,   Patient reports since left the hospital yesterday, she had 6 dark and watery loose stools yesterday, and again today.  Patient had a smog enema 05/22/18-had a large bowel movement.   She does not think this loose stool is related to the enema.  She reports poor p.o. intake but denies abdominal pain, no vomiting.  From a surgical standpoint apart from incisional site pain, she is having no problems.  ED Course: Tachycardic to 104, afebrile.  Otherwise unremarkable vitals.  Lactic acid-  1.99 .  K low 3.1 .  Hemoglobin 7.8. Creatinine at baseline 2.08.  WBC 11.8. Trop X 1 - negative.  EKG without ST or T wave changes. 2-view chest x-ray no acute cardiopulmonary disease, diffuse lytic lesions throughout the bones compatible with myeloma with healing bilateral rib fractures. 1L bolus normal saline given in the ED. Blood cultures and urine cultures obtained hospitalist called to admit.    Brief Admission Hx: CHRISHA VOGEL a 72 y.o.femalewith medical history significantfor Multiple myeloma, CKD3, who presented to the ED, with c/o SOB of 2 days, since recent hospitalization 05/22/18.Patient reports shortness of breath is unchanged the past few days ,change with exertion and at rest,and she actually feels it is more anxiety related.She reports some mild chest pain to her left chest,that started last night,persistent,worse with movement,unchanged with deep breathing.  Patient was admitted 10/1,had an appendectomy 10/1.Discharged home 10/2,presented to the ED 10/3with complaints of fever body aches,also tachycardic,was admitted for fever of unknown source, placedon vancomycin,cefepime and Flagyl initially.Patient had VQ scan and lower extremity Doppler both of which werenegative forvenous thromboembolism.Blood cultures negative. Discharged 10/4.   MDM/Assessment & Plan:   1. Generalized weakness and malaise -Pt says that she feels much better after IV fluid hydration.  Flu  test negative.  2. Pleuritic chest pain - Likely related to erosive lesions from her multiple myeloma disease.   3. Anemia of chronic disease - Stool hemoccult negative x 2.  Transfused 2  units of PRBC 10/6. Recheck Hg is 9.6.  4. Hypokalemia - repleted.  5. Hypomagnesemia - IV magnesium replacement was given.    6. Multiple myeloma not in remission - Pt was followed by hematologist Dr. Delton Coombes. According to notes she has been advised to temporarily hold her lenalidomide/dexamethasone.  I spoke with Dr. Raliegh Ip and he is not planning to restart treatment until at least 4 weeks after her appendectomy.  Pt has an appointment to see him on 05/28/18.  7. Leukocytosis - suspect from recent steroids that she has been on, but her urine culture from 10/1 was positive for pseudomonas infection.  Given her immunocompromised status I am going to treat. Rx for ciprofloxacin based on sensitivities given.    I recommended that she have her cbc rechecked when she follows up with Dr. Raliegh Ip later this week.  Pt verbalized understanding.  I also spoke with Dr. Raliegh Ip about this today and he will follow up.  Her c diff testing was negative.  Her chest xray and acute abdominal series was unremarkable.   8. Pseudomonas UTI - based on sensitivities from urine culture rx ciprofloxacin.   9. CKD stage 3 - currently at baseline, monitoring.  10. Essential hypertension - continue lisinopril daily.  24. Post op s/p recent appendectomy 05/20/18.  Spoke with Dr Arnoldo Morale 05/22/18-at that time no recommendations for repeat CT abdomen.  DVT prophylaxis: SCDs Code Status: Full  Family Communication: patient  Disposition Plan: Home   Discharge Diagnoses:  Active Problems:   Multiple myeloma not having achieved remission (HCC)   S/P laparoscopic appendectomy   SOB (shortness of breath)  Discharge Instructions: Discharge Instructions    Call MD for:  difficulty breathing, headache or visual disturbances   Complete by:  As directed    Call MD for:  extreme fatigue   Complete by:  As directed    Call MD for:  persistant dizziness or light-headedness   Complete by:  As directed    Call MD for:  persistant nausea and  vomiting   Complete by:  As directed    Call MD for:  severe uncontrolled pain   Complete by:  As directed    Increase activity slowly   Complete by:  As directed      Allergies as of 05/26/2018      Reactions   Morphine And Related Nausea And Vomiting      Medication List    STOP taking these medications   dexamethasone 4 MG tablet Commonly known as:  DECADRON   doxycycline 100 MG capsule Commonly known as:  VIBRAMYCIN   lenalidomide 10 MG capsule Commonly known as:  REVLIMID   promethazine 25 MG tablet Commonly known as:  PHENERGAN   temazepam 15 MG capsule Commonly known as:  RESTORIL   traMADol 50 MG tablet Commonly known as:  ULTRAM     TAKE these medications   acyclovir 400 MG tablet Commonly known as:  ZOVIRAX Take 1 tablet (400 mg total) by mouth 2 (two) times daily.   allopurinol 300 MG tablet Commonly known as:  ZYLOPRIM Take 1 tablet (300 mg total) by mouth daily.   ALPRAZolam 0.25 MG tablet Commonly known as:  XANAX Take 1 tablet (0.25 mg total) by mouth 2 (two) times daily as needed for  anxiety.   aspirin EC 81 MG tablet Take 81 mg by mouth daily.   CALCIUM 1000 + D PO Take 1,000 mg by mouth daily. Take one tablet daily until next visit with oncologist.   ciprofloxacin 250 MG tablet Commonly known as:  CIPRO Take 1 tablet (250 mg total) by mouth 2 (two) times daily for 5 days.   feeding supplement (ENSURE ENLIVE) Liqd Take 237 mLs by mouth 2 (two) times daily between meals for 14 days.   HYDROcodone-acetaminophen 5-325 MG tablet Commonly known as:  NORCO/VICODIN Take 1 tablet by mouth 2 (two) times daily.   lidocaine-prilocaine cream Commonly known as:  EMLA Apply to affected area once   lisinopril 10 MG tablet Commonly known as:  PRINIVIL,ZESTRIL Take 1 tablet (10 mg total) by mouth daily.   ondansetron 4 MG tablet Commonly known as:  ZOFRAN Take 1 tablet (4 mg total) by mouth every 8 (eight) hours as needed for nausea or  vomiting.   pantoprazole 40 MG tablet Commonly known as:  PROTONIX Take 1 tablet (40 mg total) by mouth daily.   polyethylene glycol packet Commonly known as:  MIRALAX / GLYCOLAX Take 17 g by mouth daily.   prochlorperazine 10 MG tablet Commonly known as:  COMPAZINE Take 1 tablet (10 mg total) by mouth every 6 (six) hours as needed (Nausea or vomiting).   trolamine salicylate 10 % cream Commonly known as:  ASPERCREME Apply 1 application topically daily as needed for muscle pain.      Follow-up Information    [name], MD Follow up on 05/28/2018.   Specialty:  Hematology Why:  as already scheduled Contact information: [place] [address] [phone]          Allergies  Allergen Reactions  . Morphine And Related Nausea And Vomiting   Allergies as of 05/26/2018      Reactions   Morphine And Related Nausea And Vomiting      Medication List    STOP taking these medications   dexamethasone 4 MG tablet Commonly known as:  DECADRON   doxycycline 100 MG capsule Commonly known as:  VIBRAMYCIN   lenalidomide 10 MG capsule Commonly known as:  REVLIMID   promethazine 25 MG tablet Commonly known as:  PHENERGAN   temazepam 15 MG capsule Commonly known as:  RESTORIL   traMADol 50 MG tablet Commonly known as:  ULTRAM     TAKE these medications   acyclovir 400 MG tablet Commonly known as:  ZOVIRAX Take 1 tablet (400 mg total) by mouth 2 (two) times daily.   allopurinol 300 MG tablet Commonly known as:  ZYLOPRIM Take 1 tablet (300 mg total) by mouth daily.   ALPRAZolam 0.25 MG tablet Commonly known as:  XANAX Take 1 tablet (0.25 mg total) by mouth 2 (two) times daily as needed for anxiety.   aspirin EC 81 MG tablet Take 81 mg by mouth daily.   CALCIUM 1000 + D PO Take 1,000 mg by mouth daily. Take one tablet daily until next visit with oncologist.   ciprofloxacin 250 MG tablet Commonly known as:  CIPRO Take 1 tablet (250 mg  total) by mouth 2 (two) times daily for 5 days.   feeding supplement (ENSURE ENLIVE) Liqd Take 237 mLs by mouth 2 (two) times daily between meals for 14 days.   HYDROcodone-acetaminophen 5-325 MG tablet Commonly known as:  NORCO/VICODIN Take 1 tablet by mouth 2 (two) times daily.   lidocaine-prilocaine cream Commonly known as:  EMLA Apply to affected area once   lisinopril 10 MG tablet Commonly known as:  PRINIVIL,ZESTRIL Take 1 tablet (10 mg total) by mouth daily.   ondansetron 4 MG tablet Commonly known as:  ZOFRAN Take 1 tablet (4 mg total) by mouth every 8 (eight) hours as needed for nausea or vomiting.   pantoprazole 40 MG tablet Commonly known as:  PROTONIX Take 1 tablet (40 mg total) by mouth daily.   polyethylene glycol packet Commonly known as:  MIRALAX / GLYCOLAX Take 17 g by mouth daily.   prochlorperazine 10 MG tablet Commonly known as:  COMPAZINE Take 1 tablet (10 mg total) by mouth every 6 (six) hours as needed (Nausea or vomiting).   trolamine salicylate 10 % cream Commonly known as:  ASPERCREME Apply 1 application topically daily as needed for muscle pain.       Procedures/Studies: Ct Abdomen Pelvis Wo Contrast  Result Date: 05/20/2018 CLINICAL DATA:  Right lower quadrant pain for 4 days, constipation, some nausea, history of multiple myeloma EXAM: CT ABDOMEN AND PELVIS WITHOUT CONTRAST TECHNIQUE: Multidetector CT imaging of the abdomen and pelvis was performed following the standard protocol without IV contrast. COMPARISON:  CT abdomen pelvis of 05/11/2017 FINDINGS: Lower chest: Linear scarring or atelectasis is again noted medially at the left lung base. No pneumonia or pleural effusion is seen. Heart size is stable. Hepatobiliary: No focal liver abnormality is seen. No gallstones, gallbladder wall thickening, or biliary dilatation. The liver is unremarkable in the unenhanced state. No calcified gallstones are seen. Pancreas: The pancreas is normal in size  and the pancreatic duct is not dilated. Spleen: The spleen is unremarkable. There is a small splenic artery calcification near the splenic hilus. Adrenals/Urinary Tract: The adrenal glands appear normal. No renal calculi are seen and there is no evidence of hydronephrosis. The ureters are normal in caliber. The urinary bladder is not well distended but no abnormality is seen. Stomach/Bowel: The stomach is largely decompressed. No small bowel abnormality is seen. No abnormality of the colon is seen other than possible edema at the base of the cecum. The appendix appears thickened and fluid filled extending caudally and anteriorly toward the right inguinal canal region. The appendix measures up to 13 mm in diameter. There is some strandiness of the surrounding soft tissue planes but no evidence of rupture or abscess formation is seen. Vascular/Lymphatic: Abdominal aorta is normal in caliber with mild abdominal aortic atherosclerosis for age. No adenopathy is seen with only small retroperitoneal nodes and mesenteric nodes present. Reproductive: The uterus has previously been resected. No adnexal lesion is seen. A tiny amount of fluid layers in the pelvis. Other: No abdominal wall hernia is seen Musculoskeletal: Diffuse bone lesions are present in this patient with known multiple myeloma with partial compression of anterior superior aspect of L4 and T11 again noted. IMPRESSION: 1. Dilated edematous and somewhat rigid appendix measuring up to 13 mm in diameter with some surrounding strandiness of the fat planes indicative of acute appendicitis. No complicating features are seen. 2. Diffuse bone lesions consistent with known multiple myeloma. Electronically Signed   By: Ivar Drape M.D.   On: 05/20/2018 13:17   Ct Abdomen Pelvis Wo Contrast  Result Date: 05/11/2018 CLINICAL DATA:  72 year old female with abdominal pain. History of multiple myeloma. EXAM: CT ABDOMEN AND PELVIS WITHOUT CONTRAST TECHNIQUE: Multidetector  CT imaging of the abdomen and pelvis was performed following the standard protocol without IV contrast. COMPARISON:  Skeletal survey radiograph dated 04/07/2018 FINDINGS: Evaluation  of this exam is limited in the absence of intravenous contrast. Lower chest: Left lung base linear atelectasis/scarring. The visualized lung bases are otherwise clear. There is hypoattenuation of the cardiac blood pool suggestive of a degree of anemia. Clinical correlation is recommended. The tip of Port-A-Cath is partially visualized in the region of the cavoatrial junction. No intra-abdominal free air or free fluid. Hepatobiliary: No focal liver abnormality is seen. No gallstones, gallbladder wall thickening, or biliary dilatation. Pancreas: Punctate focus of calcification in the body of the pancreas may be related to vascular calcification or sequela of prior inflammation. The pancreas is otherwise unremarkable. Spleen: Normal in size without focal abnormality. Adrenals/Urinary Tract: Adrenal glands are unremarkable. Kidneys are normal, without renal calculi, focal lesion, or hydronephrosis. Bladder is unremarkable. Stomach/Bowel: There is loose stool within the colon compatible with diarrheal state. There is no bowel obstruction or active inflammation. Normal appendix. Vascular/Lymphatic: Mild aortoiliac atherosclerotic disease. The abdominal aorta and IVC are otherwise grossly unremarkable on this noncontrast CT. No portal venous gas. There is no adenopathy. Reproductive: Hysterectomy.  No pelvic mass. Other: None Musculoskeletal: Osteopenia. Innumerable lytic osseous lesions consistent with known multiple myeloma. There is compression fracture of the superior endplate of L4 with approximately 40% loss of vertebral body height centrally similar or progressed since the prior radiograph. There is compression fracture of the T11 with approximately 50% loss of vertebral body height and mild anterior wedging similar to prior radiograph.  There are multiple old appearing left posterior rib fractures. No definite acute fracture. Evaluation for acute fractures however limited due to advanced osteopenia and extensive lytic lucencies. IMPRESSION: 1. Diarrheal state. Correlation with clinical exam and stool cultures recommended. No bowel obstruction. Normal appendix. 2. Innumerable osseous lytic lesions in keeping with known multiple myeloma. Known T11 and L4 compression fractures as well as multiple old-appearing left posterior rib fractures. No definite acute fracture. Electronically Signed   By: Anner Crete M.D.   On: 05/11/2018 23:00   Dg Chest 1 View  Result Date: 05/22/2018 CLINICAL DATA:  Abdominal pain and fever in a patient with a history of multiple myeloma. EXAM: CHEST  1 VIEW COMPARISON:  PA and lateral chest 05/20/2018 and 05/11/2018. FINDINGS: Lung volumes are low but the lungs are clear. Heart size is normal. No pneumothorax or pleural effusion. Port-A-Cath is in place. Multiple lytic bony lesions consistent with multiple myeloma noted. IMPRESSION: No acute disease. Lytic bone lesions consistent with multiple myeloma. Electronically Signed   By: Inge Rise M.D.   On: 05/22/2018 11:17   Dg Chest 2 View  Result Date: 05/24/2018 CLINICAL DATA:  Shortness of breath, left chest pain. History of myeloma. EXAM: CHEST - 2 VIEW COMPARISON:  05/23/2018 FINDINGS: Right Port-A-Cath in place with the tip in the SVC, unchanged. Heart is normal size. No confluent airspace opacities or effusions. Diffuse lytic lesions throughout the bony skeleton compatible with myeloma. Multiple healing bilateral rib fractures. IMPRESSION: No acute cardiopulmonary disease. Diffuse lytic lesions throughout the bones compatible with myeloma with healing bilateral rib fractures Electronically Signed   By: Rolm Baptise M.D.   On: 05/24/2018 14:39   Dg Chest 2 View  Result Date: 05/20/2018 CLINICAL DATA:  Cough, dizziness, and chest pain. History of  multiple myeloma. Nonsmoker. EXAM: CHEST - 2 VIEW COMPARISON:  PA and lateral chest x-ray of May 11, 2018 FINDINGS: The lungs are adequately inflated. The interstitial markings are coarse though stable. Confluent density projecting over the posterior aspect of the right seventh rib is  consistent with a myomatous rib lesion and is stable. The heart and pulmonary vascularity are normal. There is faint calcification in the wall of the aortic arch. The power port catheter tip projects over the midportion of the SVC. There is chronic partial compression of the body of T11 or T12. There are innumerable permeative lesions within the humeri, clavicles, and scapulae. There is deformity of the lateral aspect of the right fourth rib consistent with a previous fracture. IMPRESSION: Chronic bronchitic changes. No alveolar pneumonia nor pulmonary edema. Extensive myomatous involvement of the visualized bony structures. Thoracic aortic atherosclerosis. Electronically Signed   By: David  Martinique M.D.   On: 05/20/2018 11:36   Dg Chest 2 View  Result Date: 05/11/2018 CLINICAL DATA:  Abdominal pain EXAM: CHEST - 2 VIEW COMPARISON:  04/27/2018 FINDINGS: Right Port-A-Cath remains in place, unchanged. Heart is normal size. No confluent airspace opacities or effusions. Numerous lucencies throughout the visualized skeletal structures compatible with diffuse widespread myeloma. IMPRESSION: No acute cardiopulmonary disease. Electronically Signed   By: Rolm Baptise M.D.   On: 05/11/2018 22:57   Dg Ribs Unilateral W/chest Right  Result Date: 04/27/2018 CLINICAL DATA:  Right rib pain.  Myeloma.  Weakness. EXAM: RIGHT RIBS AND CHEST - 3+ VIEW COMPARISON:  04/14/2018 FINDINGS: Extensive lytic lesions throughout the skeleton compatible with diffuse myeloma. Expansion in lytic destruction of the left distal clavicle is especially pronounced. Underlying bony demineralization. There is some right anterior rib deformities compatible with  age-indeterminate fractures. For example, there is apparent discontinuity of the right anterior fifth rib on 1 of the projections. Moderate compression fracture at T11. There also some left upper rib deformities compatible with age-indeterminate fractures. Mild enlargement of the cardiopericardial silhouette. Upper zone pulmonary vascular prominence. IMPRESSION: 1. Age indeterminate bilateral rib fractures. 2. Extensive widespread myeloma throughout the skeleton with associated lytic lesions. Lytic destruction of the left distal clavicle. 3. Cardiomegaly and pulmonary venous hypertension. Electronically Signed   By: Van Clines M.D.   On: 04/27/2018 15:26   Nm Pulmonary Perf And Vent  Result Date: 05/23/2018 CLINICAL DATA:  Three days post appendectomy, chest pain, high pretest clinical probability for pulmonary embolism EXAM: NUCLEAR MEDICINE VENTILATION - PERFUSION LUNG SCAN TECHNIQUE: Ventilation images were obtained in multiple projections using inhaled aerosol [id number]. Perfusion images were obtained in multiple projections after intravenous injection of Tc-980mAA. RADIOPHARMACEUTICALS:  32 mCi of Tc-9962mPA aerosol inhalation and 4.4 mCi Tc99m73m IV COMPARISON:  None Correlation: Chest radiograph 05/23/2018 FINDINGS: Ventilation: Normal Perfusion: Diminished perfusion at the posterior lung bases bilaterally. Observed pattern suggests presence of small bibasilar pleural effusions or minimal atelectasis. Portable chest radiograph demonstrates subsegmental atelectasis at LEFT base but no definite pleural effusions. No other perfusion defects identified. IMPRESSION: Low probability for pulmonary embolism. Electronically Signed   By: MarkLavonia Dana.   On: 05/23/2018 12:26   Us VKoreaous Img Lower Bilateral  Result Date: 05/23/2018 CLINICAL DATA:  Bilateral lower extremity pain and edema. Elevated D-dimer. History of multiple myeloma. Evaluate for DVT EXAM: BILATERAL LOWER EXTREMITY VENOUS DOPPLER  ULTRASOUND TECHNIQUE: Gray-scale sonography with graded compression, as well as color Doppler and duplex ultrasound were performed to evaluate the lower extremity deep venous systems from the level of the common femoral vein and including the common femoral, femoral, profunda femoral, popliteal and calf veins including the posterior tibial, peroneal and gastrocnemius veins when visible. The superficial great saphenous vein was also interrogated. Spectral Doppler was utilized to evaluate flow at rest and with distal augmentation  maneuvers in the common femoral, femoral and popliteal veins. COMPARISON:  None. FINDINGS: RIGHT LOWER EXTREMITY Common Femoral Vein: No evidence of thrombus. Normal compressibility, respiratory phasicity and response to augmentation. Saphenofemoral Junction: No evidence of thrombus. Normal compressibility and flow on color Doppler imaging. Profunda Femoral Vein: No evidence of thrombus. Normal compressibility and flow on color Doppler imaging. Femoral Vein: No evidence of thrombus. Normal compressibility, respiratory phasicity and response to augmentation. Popliteal Vein: No evidence of thrombus. Normal compressibility, respiratory phasicity and response to augmentation. Calf Veins: No evidence of thrombus. Normal compressibility and flow on color Doppler imaging. Superficial Great Saphenous Vein: No evidence of thrombus. Normal compressibility. Venous Reflux:  None. Other Findings:  None. LEFT LOWER EXTREMITY Common Femoral Vein: No evidence of thrombus. Normal compressibility, respiratory phasicity and response to augmentation. Saphenofemoral Junction: No evidence of thrombus. Normal compressibility and flow on color Doppler imaging. Profunda Femoral Vein: No evidence of thrombus. Normal compressibility and flow on color Doppler imaging. Femoral Vein: No evidence of thrombus. Normal compressibility, respiratory phasicity and response to augmentation. Popliteal Vein: No evidence of  thrombus. Normal compressibility, respiratory phasicity and response to augmentation. Calf Veins: No evidence of thrombus. Normal compressibility and flow on color Doppler imaging. Superficial Great Saphenous Vein: No evidence of thrombus. Normal compressibility. Venous Reflux:  None. Other Findings:  None. IMPRESSION: No evidence of DVT within either lower extremity. Electronically Signed   By: Sandi Mariscal M.D.   On: 05/23/2018 12:43   US Venous Img Upper Uni Left  Result Date: 04/28/2018 CLINICAL DATA:  Left upper extremity edema. EXAM: LEFT UPPER EXTREMITY VENOUS DOPPLER ULTRASOUND TECHNIQUE: Gray-scale sonography with graded compression, as well as color Doppler and duplex ultrasound were performed to evaluate the upper extremity deep venous system from the level of the subclavian vein and including the jugular, axillary, basilic, radial, ulnar and upper cephalic vein. Spectral Doppler was utilized to evaluate flow at rest and with distal augmentation maneuvers. COMPARISON:  None. FINDINGS: Contralateral Subclavian Vein: Respiratory phasicity is normal and symmetric with the symptomatic side. No evidence of thrombus. Normal compressibility. Internal Jugular Vein: No evidence of thrombus. Normal compressibility, respiratory phasicity and response to augmentation. Subclavian Vein: No evidence of thrombus. Normal compressibility, respiratory phasicity and response to augmentation. Axillary Vein: No evidence of thrombus. Normal compressibility, respiratory phasicity and response to augmentation. Cephalic Vein: No evidence of thrombus. Normal compressibility, respiratory phasicity and response to augmentation. Basilic Vein: No evidence of thrombus. Normal compressibility, respiratory phasicity and response to augmentation. Brachial Veins: No evidence of thrombus. Normal compressibility, respiratory phasicity and response to augmentation. Radial Veins: No evidence of thrombus. Normal compressibility, respiratory  phasicity and response to augmentation. Ulnar Veins: No evidence of thrombus. Normal compressibility, respiratory phasicity and response to augmentation. Venous Reflux:  None visualized. Other Findings: No evidence of superficial thrombophlebitis or abnormal fluid collection. IMPRESSION: No evidence of DVT within the left upper extremity. Electronically Signed   By: Aletta Edouard M.D.   On: 04/28/2018 16:04   Dg Chest Port 1 View  Result Date: 05/23/2018 CLINICAL DATA:  Multiple myeloma.  Unexplained fever. EXAM: PORTABLE CHEST 1 VIEW COMPARISON:  05/22/2018.  05/20/2018.  05/11/2018. FINDINGS: Power port remains in place. Heart size is normal. Chronic interstitial lung markings are again evident. There is mild patchy atelectasis and or pneumonia at the left lung base. The remainder the chest is clear. Multiple lytic skeletal lesions again demonstrated. IMPRESSION: New small area of atelectasis or pneumonia in the left lower lobe. Chronic interstitial lung  markings elsewhere. Chronic lytic bone lesions. Electronically Signed   By: Nelson Chimes M.D.   On: 05/23/2018 07:37   Dg Abd Acute W/chest  Result Date: 05/26/2018 CLINICAL DATA:  Loose stools.  History of multiple myeloma. EXAM: DG ABDOMEN ACUTE W/ 1V CHEST COMPARISON:  Chest radiograph, 05/24/2018. Abdomen and pelvis CT, 05/20/2018. FINDINGS: Normal bowel gas pattern. Specifically, no evidence of obstruction, generalized adynamic ileus or free air. No evidence of renal or ureteral stones. Skin staples are noted overlying the central abdomen and lower pelvis. Soft tissues are otherwise unremarkable. Cardiac silhouette is normal in size. No mediastinal or hilar masses. Lungs show prominent bronchovascular and interstitial markings, but no evidence of pneumonia or pulmonary edema and no change. Right anterior chest wall Port-A-Cath is stable. Skeletal structures are demineralized with numerous well-defined lytic lesions consistent with the given history  of multiple myeloma. IMPRESSION: 1. No acute findings. No evidence of bowel obstruction, generalized adynamic ileus or free air. 2. No acute cardiopulmonary disease. Electronically Signed   By: Lajean Manes M.D.   On: 05/26/2018 15:06     Subjective: Pt says that she feels much better and she wants to go home.  She has no specific complaints today.    Discharge Exam: Vitals:   05/25/18 2110 05/26/18 0552  BP: (!) 148/85 139/65  Pulse: 100 96  Resp: 16 16  Temp: 98.8 F (37.1 C) 99.1 F (37.3 C)  SpO2: 98% 98%   Vitals:   05/25/18 1817 05/25/18 1850 05/25/18 2110 05/26/18 0552  BP:  (!) 151/80 (!) 148/85 139/65  Pulse:  99 100 96  Resp:   16 16  Temp: 99.8 F (37.7 C) 99.3 F (37.4 C) 98.8 F (37.1 C) 99.1 F (37.3 C)  TempSrc: Oral Oral Oral Oral  SpO2:  98% 98% 98%  Weight:      Height:       Eyes:PERRL, lids and conjunctivae normal.   ENMT:Mucous membranes are moist. Posterior pharynx clear of any exudate or lesions. Neck:normal,supple, no masses, no thyromegaly Respiratory:clear to auscultation bilaterally, no wheezing, no crackles. Normal respiratory effort. No accessory muscle use.  Cardiovascular:norma. 2+ pedal pulses. Point tenderness left lower rib,tender to palpation. Port- a cath- right upper chest,site clean, no surrounding erythema ortenderness.  Abdomen:no tenderness,soft,clean Band-Aid dressing on abdomen,no masses palpated. No hepatosplenomegaly. Bowel sounds positive.  Musculoskeletal:no clubbing / cyanosis. No joint deformity upper and lower extremities. Good ROM, no contractures. Normal muscle tone.  Skin:Seborrheic keratosison back, lesions, ulcers. No induration Neurologic:CN 2-12 grossly intact. sensation intact, Strength 5/5 in all 4.  Psychiatric:Normal judgment and insight. Alert and oriented x 3. Improved affect.   The results of significant diagnostics from this hospitalization (including imaging, microbiology, ancillary and  laboratory) are listed below for reference.     Microbiology: Recent Results (from the past 240 hour(s))  Urine culture     Status: Abnormal   Collection Time: 05/20/18 10:40 AM  Result Value Ref Range Status   Specimen Description   Final    URINE, CLEAN CATCH Performed at [facility], [address], [place], [place] [address]    Special Requests   Final    Normal Performed at [facility], [address], [place], [place] [address]    Culture >=100,000 COLONIES/mL PSEUDOMONAS AERUGINOSA (A)  Final   Report Status 05/23/2018 FINAL  Final   Organism ID, Bacteria PSEUDOMONAS AERUGINOSA (A)  Final      Susceptibility   Pseudomonas aeruginosa - MIC*    CEFTAZIDIME  4 SENSITIVE Sensitive     CIPROFLOXACIN <=0.25 SENSITIVE Sensitive     GENTAMICIN <=1 SENSITIVE Sensitive     IMIPENEM 1 SENSITIVE Sensitive     PIP/TAZO 8 SENSITIVE Sensitive     CEFEPIME 2 SENSITIVE Sensitive     * >=100,000 COLONIES/mL PSEUDOMONAS AERUGINOSA  Blood Culture (routine x 2)     Status: None   Collection Time: 05/20/18 10:58 AM  Result Value Ref Range Status   Specimen Description BLOOD LEFT HAND  Final   Special Requests   Final    BOTTLES DRAWN AEROBIC AND ANAEROBIC Blood Culture adequate volume   Culture   Final    NO GROWTH 5 DAYS Performed at [facility], [address] [place], [place] [address]    Report Status 05/25/2018 FINAL  Final  Blood Culture (routine x 2)     Status: None   Collection Time: 05/20/18 11:06 AM  Result Value Ref Range Status   Specimen Description BLOOD LEFT HAND  Final   Special Requests   Final    BOTTLES DRAWN AEROBIC AND ANAEROBIC Blood Culture adequate volume   Culture   Final    NO GROWTH 5 DAYS Performed at [facility], [address] [place], [place] [address]    Report Status 05/25/2018 FINAL  Final  MRSA PCR Screening     Status: Abnormal   Collection Time: 05/20/18  5:31 PM  Result Value Ref Range Status   MRSA by PCR POSITIVE (A) NEGATIVE Final     Comment:        The GeneXpert MRSA Assay (FDA approved for NASAL specimens only), is one component of a comprehensive MRSA colonization surveillance program. It is not intended to diagnose MRSA infection nor to guide or monitor treatment for MRSA infections. RESULT CALLED TO, READ BACK BY AND VERIFIED WITH: AMBURN,A AT 0653 BY HUFFINES,S ON 05/21/18. Performed at [facility], [address] [place], [place] [address]   Culture, blood (Routine x 2)     Status: None (Preliminary result)   Collection Time: 05/22/18 10:12 AM  Result Value Ref Range Status   Specimen Description BLOOD LEFT HAND DRAWN BY RN  Final   Special Requests   Final    BOTTLES DRAWN AEROBIC AND ANAEROBIC Blood Culture results may not be optimal due to an excessive volume of blood received in culture bottles   Culture   Final    NO GROWTH 4 DAYS Performed at [facility], [address], [place], [place] [address]    Report Status PENDING  Incomplete  Culture, blood (Routine x 2)     Status: None (Preliminary result)   Collection Time: 05/22/18 10:12 AM  Result Value Ref Range Status   Specimen Description BLOOD RIGHT HAND  Final   Special Requests   Final    BOTTLES DRAWN AEROBIC AND ANAEROBIC Blood Culture adequate volume   Culture   Final    NO GROWTH 4 DAYS Performed at [facility], [address] [place], [place] [address]    Report Status PENDING  Incomplete  Urine culture     Status: None   Collection Time: 05/22/18 12:28 PM  Result Value Ref Range Status   Specimen Description   Final    URINE, CATHETERIZED Performed at [facility], [address] [place], [place] [address]    Special Requests   Final    Normal Performed at [facility], [address] [place], [place] [address]    Culture   Final  NO GROWTH Performed at [facility], [address], [place], [place] [address]    Report Status 05/24/2018 FINAL  Final  Blood Culture (routine x 2)     Status: None (Preliminary  result)   Collection Time: 05/24/18  2:21 PM  Result Value Ref Range Status   Specimen Description BLOOD RIGHT ARM DRAWN BY RN  Final   Special Requests   Final    BOTTLES DRAWN AEROBIC AND ANAEROBIC Blood Culture adequate volume   Culture   Final    NO GROWTH 2 DAYS Performed at [facility], [address], [place], [place] [address]    Report Status PENDING  Incomplete  Blood Culture (routine x 2)     Status: None (Preliminary result)   Collection Time: 05/24/18  3:00 PM  Result Value Ref Range Status   Specimen Description BLOOD LEFT HAND  Final   Special Requests   Final    BOTTLES DRAWN AEROBIC AND ANAEROBIC Blood Culture adequate volume   Culture   Final    NO GROWTH 2 DAYS Performed at [facility], [address], [place], [place] [address]    Report Status PENDING  Incomplete  Urine culture     Status: None   Collection Time: 05/24/18  8:01 PM  Result Value Ref Range Status   Specimen Description   Final    URINE, CLEAN CATCH Performed at [facility], [address] [place], [place] [address]    Special Requests   Final    NONE Performed at [facility], [address], [place], [place] [address]    Culture   Final    NO GROWTH Performed at [facility], [place] [address] [place], [place] [address]    Report Status 05/26/2018 FINAL  Final  C difficile quick scan w PCR reflex     Status: None   Collection Time: 05/25/18  8:00 AM  Result Value Ref Range Status   C Diff antigen NEGATIVE NEGATIVE Final   C Diff toxin NEGATIVE NEGATIVE Final   C Diff interpretation No C. difficile detected.  Final    Comment: Performed at [facility], [address], [place], [place] [address]     Labs: BNP (last 3 results) No results for input(s): BNP in the last 8760 hours. Basic Metabolic Panel: Recent Labs  Lab 05/22/18 1012 05/23/18 0511 05/24/18 1421 05/25/18 0343 05/26/18 0518  NA 132* 137 134* 135 134*  K 4.0 4.3 3.1* 4.0 4.4  CL 99 105 104 109 105  CO2 22  23 21* 22 23  GLUCOSE 149* 165* 236* 129* 172*  BUN 23 21 22 18 16   CREATININE 2.28* 2.20* 2.08* 1.95* 1.72*  CALCIUM 6.7* 6.5* 7.0* 6.6* 6.6*  MG  --   --  1.4* 1.7 1.4*   Liver Function Tests: Recent Labs  Lab 05/20/18 1058 05/22/18 1012 05/23/18 0511 05/24/18 1421 05/26/18 0518  AST 18 22 25 23 25   ALT 21 38 48* 46* 47*  ALKPHOS 150* 122 128* 125 169*  BILITOT 0.5 0.5 0.6 0.7 0.6  PROT 7.8 6.9 6.3* 5.9* 5.7*  ALBUMIN 3.0* 2.6* 2.5* 2.4* 2.4*   Recent Labs  Lab 05/22/18 1036  LIPASE 25   No results for input(s): AMMONIA in the last 168 hours. CBC: Recent Labs  Lab 05/20/18 1058  05/22/18 1012 05/23/18 0511 05/24/18 1421 05/25/18 0343 05/25/18 2115 05/26/18 0518  WBC 14.8*   < > 12.7* 13.6* 11.8* 12.2*  --  14.3*  NEUTROABS 13.7  --  10.8 11.1* 8.2*  --   --  8.9  HGB 9.0*   < > 8.6* 8.6* 7.8* 7.3* 9.3* 9.6*  HCT 27.7*   < > 26.0* 26.7* 23.7* 21.6* 27.2* 29.0*  MCV 93.6   < > 92.9 94.7 92.6 93.1  --  91.2  PLT 170   < > 133* 125* 111* 131*  --  150   < > = values in this interval not displayed.   Cardiac Enzymes: Recent Labs  Lab 05/22/18 1457 05/23/18 0511 05/24/18 1421 05/24/18 2031 05/25/18 0342  CKTOTAL 18* 14*  --   --   --   TROPONINI  --   --  <0.03 <0.03 <0.03   BNP: Invalid input(s): POCBNP CBG: No results for input(s): GLUCAP in the last 168 hours. D-Dimer No results for input(s): DDIMER in the last 72 hours. Hgb A1c No results for input(s): HGBA1C in the last 72 hours. Lipid Profile No results for input(s): CHOL, HDL, LDLCALC, TRIG, CHOLHDL, LDLDIRECT in the last 72 hours. Thyroid function studies No results for input(s): TSH, T4TOTAL, T3FREE, THYROIDAB in the last 72 hours.  Invalid input(s): FREET3 Anemia work up Recent Labs    05/25/18 0343  FERRITIN 1,415*  TIBC 128*  IRON 77   Urinalysis    Component Value Date/Time   COLORURINE YELLOW 05/24/2018 2001   [place] 05/24/2018 2001   [place] 1.012 05/24/2018  2001   PHURINE 5.0 05/24/2018 2001   GLUCOSEU 50 (A) 05/24/2018 2001   HGBUR NEGATIVE 05/24/2018 2001   [place] NEGATIVE 05/24/2018 2001   [place] NEGATIVE 05/24/2018 2001   PROTEINUR 30 (A) 05/24/2018 2001   NITRITE NEGATIVE 05/24/2018 2001   LEUKOCYTESUR NEGATIVE 05/24/2018 2001   Sepsis Labs Invalid input(s): PROCALCITONIN,  WBC,  LACTICIDVEN Microbiology Recent Results (from the past 240 hour(s))  Urine culture     Status: Abnormal   Collection Time: 05/20/18 10:40 AM  Result Value Ref Range Status   Specimen Description   Final    URINE, CLEAN CATCH Performed at [facility], [address], [place], [place] [address]    Special Requests   Final    Normal Performed at [facility], [address], [place], [place] [address]    Culture >=100,000 COLONIES/mL PSEUDOMONAS AERUGINOSA (A)  Final   Report Status 05/23/2018 FINAL  Final   Organism ID, Bacteria PSEUDOMONAS AERUGINOSA (A)  Final      Susceptibility   Pseudomonas aeruginosa - MIC*    CEFTAZIDIME 4 SENSITIVE Sensitive     CIPROFLOXACIN <=0.25 SENSITIVE Sensitive     GENTAMICIN <=1 SENSITIVE Sensitive     IMIPENEM 1 SENSITIVE Sensitive     PIP/TAZO 8 SENSITIVE Sensitive     CEFEPIME 2 SENSITIVE Sensitive     * >=100,000 COLONIES/mL PSEUDOMONAS AERUGINOSA  Blood Culture (routine x 2)     Status: None   Collection Time: 05/20/18 10:58 AM  Result Value Ref Range Status   Specimen Description BLOOD LEFT HAND  Final   Special Requests   Final    BOTTLES DRAWN AEROBIC AND ANAEROBIC Blood Culture adequate volume   Culture   Final    NO GROWTH 5 DAYS Performed at [facility], [address], [place], [place] [address]    Report Status 05/25/2018 FINAL  Final  Blood Culture (routine x 2)     Status: None   Collection Time: 05/20/18 11:06 AM  Result Value Ref Range Status   Specimen Description  BLOOD LEFT HAND  Final   Special Requests   Final    BOTTLES DRAWN AEROBIC AND ANAEROBIC Blood Culture adequate volume    Culture   Final    NO GROWTH 5 DAYS Performed at [facility], [address], [place], [place] [address]    Report Status 05/25/2018 FINAL  Final  MRSA PCR Screening     Status: Abnormal   Collection Time: 05/20/18  5:31 PM  Result Value Ref Range Status   MRSA by PCR POSITIVE (A) NEGATIVE Final    Comment:        The GeneXpert MRSA Assay (FDA approved for NASAL specimens only), is one component of a comprehensive MRSA colonization surveillance program. It is not intended to diagnose MRSA infection nor to guide or monitor treatment for MRSA infections. RESULT CALLED TO, READ BACK BY AND VERIFIED WITH: AMBURN,A AT 0653 BY HUFFINES,S ON 05/21/18. Performed at [facility], [address] [place], [place] [address]   Culture, blood (Routine x 2)     Status: None (Preliminary result)   Collection Time: 05/22/18 10:12 AM  Result Value Ref Range Status   Specimen Description BLOOD LEFT HAND DRAWN BY RN  Final   Special Requests   Final    BOTTLES DRAWN AEROBIC AND ANAEROBIC Blood Culture results may not be optimal due to an excessive volume of blood received in culture bottles   Culture   Final    NO GROWTH 4 DAYS Performed at [facility], [address] [place], [place] [address]    Report Status PENDING  Incomplete  Culture, blood (Routine x 2)     Status: None (Preliminary result)   Collection Time: 05/22/18 10:12 AM  Result Value Ref Range Status   Specimen Description BLOOD RIGHT HAND  Final   Special Requests   Final    BOTTLES DRAWN AEROBIC AND ANAEROBIC Blood Culture adequate volume   Culture   Final    NO GROWTH 4 DAYS Performed at [facility], [address] [place], [place] [address]    Report Status PENDING  Incomplete  Urine culture     Status: None   Collection Time: 05/22/18 12:28 PM  Result Value Ref Range Status   Specimen Description   Final    URINE, CATHETERIZED Performed at [facility], [address], [place], [place] [address]    Special  Requests   Final    Normal Performed at [facility], [address], [place], [place] [address]    Culture   Final    NO GROWTH Performed at [facility], [place] [address] [place], [place] [address]    Report Status 05/24/2018 FINAL  Final  Blood Culture (routine x 2)     Status: None (Preliminary result)   Collection Time: 05/24/18  2:21 PM  Result Value Ref Range Status   Specimen Description BLOOD RIGHT ARM DRAWN BY RN  Final   Special Requests   Final    BOTTLES DRAWN AEROBIC AND ANAEROBIC Blood Culture adequate volume   Culture   Final    NO GROWTH 2 DAYS Performed at [facility], [address] [place], [place] [address]    Report Status PENDING  Incomplete  Blood Culture (routine x 2)     Status: None (Preliminary result)   Collection Time: 05/24/18  3:00 PM  Result Value Ref Range Status   Specimen Description BLOOD LEFT HAND  Final   Special Requests   Final    BOTTLES  DRAWN AEROBIC AND ANAEROBIC Blood Culture adequate volume   Culture   Final    NO GROWTH 2 DAYS Performed at [facility], [address], [place], [place] [address]    Report Status PENDING  Incomplete  Urine culture     Status: None   Collection Time: 05/24/18  8:01 PM  Result Value Ref Range Status   Specimen Description   Final    URINE, CLEAN CATCH Performed at [facility], [address] [place], [place] [address]    Special Requests   Final    NONE Performed at [facility], [address] [place], [place] [address]    Culture   Final    NO GROWTH Performed at [facility], [place] [address] [place], [place] [address]    Report Status 05/26/2018 FINAL  Final  C difficile quick scan w PCR reflex     Status: None   Collection Time: 05/25/18  8:00 AM  Result Value Ref Range Status   C Diff antigen NEGATIVE NEGATIVE Final   C Diff toxin NEGATIVE NEGATIVE Final   C Diff interpretation No C. difficile detected.  Final    Comment: Performed at [facility], [address],  [place], [place] [address]   Time coordinating discharge: 32 minutes  SIGNED:  Irwin Brakeman, MD  [facility] 05/26/2018, 4:13 PM Pager [phone]  If 7PM-7AM, please contact night-coverage [email or web address] Password [email or web address]

## 2018-05-27 ENCOUNTER — Observation Stay (HOSPITAL_COMMUNITY)
Admission: EM | Admit: 2018-05-27 | Discharge: 2018-05-29 | Disposition: A | Discharge status: Home / Self Care | Payer: Medicaid Other | Attending: Internal Medicine | Admitting: Internal Medicine

## 2018-05-27 ENCOUNTER — Encounter (HOSPITAL_COMMUNITY): Payer: Self-pay | Admitting: Emergency Medicine

## 2018-05-27 ENCOUNTER — Emergency Department (HOSPITAL_COMMUNITY): Payer: Medicaid Other

## 2018-05-27 ENCOUNTER — Telehealth (HOSPITAL_COMMUNITY): Payer: Self-pay | Admitting: *Deleted

## 2018-05-27 ENCOUNTER — Other Ambulatory Visit: Payer: Self-pay

## 2018-05-27 DIAGNOSIS — Z7982 Long term (current) use of aspirin: Secondary | ICD-10-CM | POA: Diagnosis not present

## 2018-05-27 DIAGNOSIS — I129 Hypertensive chronic kidney disease with stage 1 through stage 4 chronic kidney disease, or unspecified chronic kidney disease: Secondary | ICD-10-CM | POA: Insufficient documentation

## 2018-05-27 DIAGNOSIS — T7840XA Allergy, unspecified, initial encounter: Secondary | ICD-10-CM

## 2018-05-27 DIAGNOSIS — F418 Other specified anxiety disorders: Secondary | ICD-10-CM | POA: Diagnosis present

## 2018-05-27 DIAGNOSIS — R739 Hyperglycemia, unspecified: Secondary | ICD-10-CM | POA: Diagnosis present

## 2018-05-27 DIAGNOSIS — F32A Depression, unspecified: Secondary | ICD-10-CM | POA: Diagnosis present

## 2018-05-27 DIAGNOSIS — F329 Major depressive disorder, single episode, unspecified: Secondary | ICD-10-CM | POA: Insufficient documentation

## 2018-05-27 DIAGNOSIS — F419 Anxiety disorder, unspecified: Secondary | ICD-10-CM | POA: Diagnosis not present

## 2018-05-27 DIAGNOSIS — R21 Rash and other nonspecific skin eruption: Secondary | ICD-10-CM | POA: Diagnosis present

## 2018-05-27 DIAGNOSIS — I10 Essential (primary) hypertension: Secondary | ICD-10-CM | POA: Diagnosis present

## 2018-05-27 DIAGNOSIS — Z79899 Other long term (current) drug therapy: Secondary | ICD-10-CM | POA: Diagnosis not present

## 2018-05-27 DIAGNOSIS — N184 Chronic kidney disease, stage 4 (severe): Secondary | ICD-10-CM | POA: Diagnosis not present

## 2018-05-27 DIAGNOSIS — R000 Tachycardia, unspecified: Secondary | ICD-10-CM

## 2018-05-27 DIAGNOSIS — C9000 Multiple myeloma not having achieved remission: Secondary | ICD-10-CM | POA: Diagnosis not present

## 2018-05-27 LAB — BASIC METABOLIC PANEL
Anion gap: 8 (ref 5–15)
BUN: 17 mg/dL (ref 8–23)
CALCIUM: 7.5 mg/dL — AB (ref 8.9–10.3)
CO2: 23 mmol/L (ref 22–32)
CREATININE: 1.83 mg/dL — AB (ref 0.44–1.00)
Chloride: 103 mmol/L (ref 98–111)
GFR calc Af Amer: 31 mL/min — ABNORMAL LOW (ref >60)
GFR, EST NON AFRICAN AMERICAN: 27 mL/min — AB (ref >60)
GLUCOSE: 207 mg/dL — AB (ref 70–99)
Potassium: 3.9 mmol/L (ref 3.5–5.1)
Sodium: 134 mmol/L — ABNORMAL LOW (ref 135–145)

## 2018-05-27 LAB — CULTURE, BLOOD (ROUTINE X 2)
CULTURE: NO GROWTH
Culture: NO GROWTH
SPECIAL REQUESTS: ADEQUATE

## 2018-05-27 LAB — CBC
HCT: 34.3 % — ABNORMAL LOW (ref 36.0–46.0)
Hemoglobin: 11 g/dL — ABNORMAL LOW (ref 12.0–15.0)
MCH: 29.6 pg (ref 26.0–34.0)
MCHC: 32.1 g/dL (ref 30.0–36.0)
MCV: 92.5 fL (ref 80.0–100.0)
NRBC: 0 % (ref 0.0–0.2)
PLATELETS: 214 10*3/uL (ref 150–400)
RBC: 3.71 MIL/uL — ABNORMAL LOW (ref 3.87–5.11)
RDW: 17.8 % — AB (ref 11.5–15.5)
WBC: 14.3 10*3/uL — ABNORMAL HIGH (ref 4.0–10.5)

## 2018-05-27 LAB — URINALYSIS, ROUTINE W REFLEX MICROSCOPIC
BILIRUBIN URINE: NEGATIVE
Bacteria, UA: NONE SEEN
Glucose, UA: >=500 mg/dL — AB
HGB URINE DIPSTICK: NEGATIVE
Ketones, ur: NEGATIVE mg/dL
Leukocytes, UA: NEGATIVE
NITRITE: NEGATIVE
Protein, ur: NEGATIVE mg/dL
SPECIFIC GRAVITY, URINE: 1.006 (ref 1.005–1.030)
pH: 5 (ref 5.0–8.0)

## 2018-05-27 LAB — GLUCOSE, CAPILLARY: Glucose-Capillary: 246 mg/dL — ABNORMAL HIGH (ref 70–99)

## 2018-05-27 LAB — SAMPLE TO BLOOD BANK

## 2018-05-27 LAB — TROPONIN I: Troponin I: <0.03 ng/mL (ref <0.03)

## 2018-05-27 LAB — MAGNESIUM: MAGNESIUM: 1.7 mg/dL (ref 1.7–2.4)

## 2018-05-27 MED ORDER — LISINOPRIL 10 MG PO TABS
10.0000 mg | ORAL_TABLET | Freq: Every day | ORAL | Status: DC
  Administered 2018-05-28 – 2018-05-29 (×2): 10 mg via ORAL
  Filled 2018-05-27 (×2): qty 1

## 2018-05-27 MED ORDER — ACETAMINOPHEN 325 MG PO TABS: 650.0000 mg | ORAL_TABLET | Freq: Four times a day (QID) | ORAL | Status: DC | PRN

## 2018-05-27 MED ORDER — HYDROCODONE-ACETAMINOPHEN 5-325 MG PO TABS
1.0000 | ORAL_TABLET | Freq: Two times a day (BID) | ORAL | Status: DC
  Administered 2018-05-27 – 2018-05-29 (×4): 1 via ORAL
  Filled 2018-05-27 (×4): qty 1

## 2018-05-27 MED ORDER — ALLOPURINOL 300 MG PO TABS
300.0000 mg | ORAL_TABLET | Freq: Every day | ORAL | Status: DC
  Administered 2018-05-28 – 2018-05-29 (×2): 300 mg via ORAL
  Filled 2018-05-27 (×2): qty 1

## 2018-05-27 MED ORDER — SODIUM CHLORIDE 0.9% FLUSH
3.0000 mL | Freq: Two times a day (BID) | INTRAVENOUS | Status: DC
  Administered 2018-05-27 – 2018-05-28 (×2): 3 mL via INTRAVENOUS

## 2018-05-27 MED ORDER — IPRATROPIUM-ALBUTEROL 0.5-2.5 (3) MG/3ML IN SOLN
3.0000 mL | Freq: Once | RESPIRATORY_TRACT | Status: AC
  Administered 2018-05-27: 3 mL via RESPIRATORY_TRACT
  Filled 2018-05-27: qty 3

## 2018-05-27 MED ORDER — SODIUM CHLORIDE 0.9 % IV BOLUS
1000.0000 mL | Freq: Once | INTRAVENOUS | Status: AC
  Administered 2018-05-27: 1000 mL via INTRAVENOUS

## 2018-05-27 MED ORDER — PROCHLORPERAZINE MALEATE 5 MG PO TABS: 10.0000 mg | ORAL_TABLET | Freq: Four times a day (QID) | ORAL | Status: DC | PRN

## 2018-05-27 MED ORDER — ACETAMINOPHEN 650 MG RE SUPP: 650.0000 mg | Freq: Four times a day (QID) | RECTAL | Status: DC | PRN

## 2018-05-27 MED ORDER — ENOXAPARIN SODIUM 30 MG/0.3ML ~~LOC~~ SOLN
30.0000 mg | SUBCUTANEOUS | Status: DC
  Administered 2018-05-27 – 2018-05-28 (×2): 30 mg via SUBCUTANEOUS
  Filled 2018-05-27 (×2): qty 0.3

## 2018-05-27 MED ORDER — DIPHENHYDRAMINE HCL 50 MG/ML IJ SOLN
25.0000 mg | Freq: Four times a day (QID) | INTRAMUSCULAR | Status: DC | PRN
  Administered 2018-05-27 – 2018-05-29 (×6): 25 mg via INTRAVENOUS
  Filled 2018-05-27 (×6): qty 1

## 2018-05-27 MED ORDER — SODIUM CHLORIDE 0.9 % IV SOLN
INTRAVENOUS | Status: DC
Start: 2018-05-27 — End: 2018-05-27

## 2018-05-27 MED ORDER — DIPHENHYDRAMINE HCL 50 MG/ML IJ SOLN
25.0000 mg | Freq: Once | INTRAMUSCULAR | Status: AC
  Administered 2018-05-27: 25 mg via INTRAVENOUS
  Filled 2018-05-27: qty 1

## 2018-05-27 MED ORDER — ASPIRIN EC 81 MG PO TBEC
81.0000 mg | DELAYED_RELEASE_TABLET | Freq: Every day | ORAL | Status: DC
  Administered 2018-05-28 – 2018-05-29 (×2): 81 mg via ORAL
  Filled 2018-05-27 (×2): qty 1

## 2018-05-27 MED ORDER — FAMOTIDINE IN NACL 20-0.9 MG/50ML-% IV SOLN
20.0000 mg | Freq: Once | INTRAVENOUS | Status: AC
  Administered 2018-05-27: 20 mg via INTRAVENOUS
  Filled 2018-05-27: qty 50

## 2018-05-27 MED ORDER — SODIUM CHLORIDE 0.9% FLUSH: 3.0000 mL | INTRAVENOUS | Status: DC | PRN

## 2018-05-27 MED ORDER — POLYETHYLENE GLYCOL 3350 17 G PO PACK
17.0000 g | PACK | Freq: Every day | ORAL | Status: DC
  Filled 2018-05-27 (×2): qty 1

## 2018-05-27 MED ORDER — SODIUM CHLORIDE 0.9 % IV SOLN: 250.0000 mL | INTRAVENOUS | Status: DC | PRN

## 2018-05-27 MED ORDER — PANTOPRAZOLE SODIUM 40 MG PO TBEC
40.0000 mg | DELAYED_RELEASE_TABLET | Freq: Every day | ORAL | Status: DC
  Administered 2018-05-27 – 2018-05-29 (×3): 40 mg via ORAL
  Filled 2018-05-27 (×3): qty 1

## 2018-05-27 MED ORDER — DIPHENHYDRAMINE HCL 50 MG/ML IJ SOLN
50.0000 mg | Freq: Once | INTRAMUSCULAR | Status: AC
  Administered 2018-05-27: 50 mg via INTRAVENOUS
  Filled 2018-05-27: qty 1

## 2018-05-27 MED ORDER — METHYLPREDNISOLONE SODIUM SUCC 125 MG IJ SOLR
125.0000 mg | Freq: Once | INTRAMUSCULAR | Status: AC
  Administered 2018-05-27: 125 mg via INTRAVENOUS
  Filled 2018-05-27: qty 2

## 2018-05-27 MED ORDER — ACYCLOVIR 800 MG PO TABS
400.0000 mg | ORAL_TABLET | Freq: Two times a day (BID) | ORAL | Status: DC
  Administered 2018-05-27 – 2018-05-29 (×4): 400 mg via ORAL
  Filled 2018-05-27 (×4): qty 1

## 2018-05-27 MED ORDER — HYDROXYZINE HCL 25 MG PO TABS
25.0000 mg | ORAL_TABLET | Freq: Once | ORAL | Status: AC
  Administered 2018-05-27: 25 mg via ORAL
  Filled 2018-05-27: qty 1

## 2018-05-27 MED ORDER — METHYLPREDNISOLONE SODIUM SUCC 125 MG IJ SOLR
80.0000 mg | Freq: Three times a day (TID) | INTRAMUSCULAR | Status: DC
  Administered 2018-05-27 – 2018-05-28 (×2): 80 mg via INTRAVENOUS
  Filled 2018-05-27 (×2): qty 2

## 2018-05-27 MED ORDER — ENSURE ENLIVE PO LIQD
237.0000 mL | Freq: Two times a day (BID) | ORAL | Status: DC
  Administered 2018-05-28 – 2018-05-29 (×3): 237 mL via ORAL

## 2018-05-27 MED ORDER — LORAZEPAM 2 MG/ML IJ SOLN
1.0000 mg | Freq: Once | INTRAMUSCULAR | Status: AC
  Administered 2018-05-27: 1 mg via INTRAVENOUS
  Filled 2018-05-27: qty 1

## 2018-05-27 MED ORDER — ALPRAZOLAM 0.25 MG PO TABS
0.2500 mg | ORAL_TABLET | Freq: Two times a day (BID) | ORAL | Status: DC | PRN
  Administered 2018-05-27 – 2018-05-29 (×3): 0.25 mg via ORAL
  Filled 2018-05-27 (×3): qty 1

## 2018-05-27 NOTE — H&P (Addendum)
TRH H&P   Patient Demographics:    Alexandra Price, is a 72 y.o. female  MRN: [id number]   DOB - [birth year]  Admit Date - 05/27/2018  Outpatient Primary MD for the patient is Patient, No Pcp Per  Referring MD/NP/PA:  Isla Pence  Outpatient Specialists:   Delton Coombes  Patient coming from: home  Chief Complaint  Patient presents with  . Chest Pain  . Allergic Reaction      HPI:    Alexandra Price  is a 72 y.o. female, w multiple myeloma , hypertension, ckd stage3, anxiety/ depression, recent appendectomy, and recent pseudomonas UTI 05/20/2018 was discharged on cipro 05/26/2018 and presents with c/o rash on chest and back (itchy). Pt denies fever, chills, any new lotion, detergent , soap or medication.   In Ed,  T 99.4,  P 113,  Bp 139/77, pox 94-100 %  CXR IMPRESSION: Extensive bony changes of multiple myeloma. No edema or consolidation. No evident adenopathy.  Wbc 14.3, Hgb 9.6, Plt 150 Na 134, K 3.9, Bun 17, Creatinine 1.83 Glucose 207 Magnesium 1.7 Blood culture x2 Urinalysis negative  Urine culture pending   Pt was tx with solumedrol and benadryl in ED without relief.  Pt continues to itch.   Pt will be admitted for rash most likely secondary to cipro.  Revlimid can cause rash but this has been on hold and requested ED to call oncology to determine if they think that this could cause her rash.         Review of systems:    In addition to the HPI above, No Fever-chills, No Headache, No changes with Vision or hearing, No problems swallowing food or Liquids,  + Chest pain, sharp, left sided,  Has been going on for a while, worse with palpation per patient  No Cough or Shortness of Breath, No Abdominal pain, No Nausea or Vommitting, Bowel movements are regular, No Blood in stool or Urine, No dysuria,  No tick bite  No new joints pains-aches,  No  new weakness, tingling, numbness in any extremity, No recent weight gain or loss, No polyuria, polydypsia or polyphagia, No significant Mental Stressors.  A full 10 point Review of Systems was done, except as stated above, all other Review of Systems were negative.   With Past History of the following :    Past Medical History:  Diagnosis Date  . Anxiety   . Depression   . Hypertension   . Multiple myeloma ([place])    multiple myeloma      Past Surgical History:  Procedure Laterality Date  . ABDOMINAL HYSTERECTOMY     total  . LAPAROSCOPIC APPENDECTOMY N/A 05/20/2018   Procedure: APPENDECTOMY LAPAROSCOPIC;  Surgeon: [name], MD;  Location: [facility];  Service: General;  Laterality: N/A;  . PORTACATH PLACEMENT Right 04/14/2018   Procedure: INSERTION PORT-A-CATH;  Surgeon: [name], MD;  Location: [facility];  Service: General;  Laterality: Right;      Social History:     Social History   Tobacco Use  . Smoking status: Never Smoker  . Smokeless tobacco: Never Used  Substance Use Topics  . Alcohol use: Never    Frequency: Never     Lives - at home  Mobility - walks by self   Family History :     Family History  Problem Relation Age of Onset  . Heart disease Mother   . Emphysema Father   . Diabetes Sister   . Cancer Brother        liver, lung, and colon  . Diabetes Brother        Home Medications:   Prior to Admission medications   Medication Sig Start Date End Date Taking? Authorizing Provider  acyclovir (ZOVIRAX) 400 MG tablet Take 1 tablet (400 mg total) by mouth 2 (two) times daily. 05/23/18  Yes Johnson, Clanford L, MD  allopurinol (ZYLOPRIM) 300 MG tablet Take 1 tablet (300 mg total) by mouth daily. 04/24/18  Yes [name], MD  ALPRAZolam Duanne Moron) 0.25 MG tablet Take 1 tablet (0.25 mg total) by mouth 2 (two) times daily as needed for anxiety. 05/15/18  Yes [name], MD  aspirin EC 81 MG tablet Take 81 mg by mouth daily.   Yes  [provider]  Calcium Carb-Cholecalciferol (CALCIUM 1000 + D PO) Take 1,000 mg by mouth daily. Take one tablet daily until next visit with oncologist.   Yes [provider]  ciprofloxacin (CIPRO) 250 MG tablet Take 1 tablet (250 mg total) by mouth 2 (two) times daily for 5 days. 05/26/18 05/31/18 Yes Johnson, Clanford L, MD  feeding supplement, ENSURE ENLIVE, (ENSURE ENLIVE) LIQD Take 237 mLs by mouth 2 (two) times daily between meals for 14 days. 05/24/18 06/07/18 Yes Johnson, Clanford L, MD  HYDROcodone-acetaminophen (NORCO) 5-325 MG tablet Take 1 tablet by mouth 2 (two) times daily. 05/15/18  Yes Lockamy, Randi L, NP-C  lidocaine-prilocaine (EMLA) cream Apply to affected area once 04/15/18  Yes [name], MD  lisinopril (PRINIVIL,ZESTRIL) 10 MG tablet Take 1 tablet (10 mg total) by mouth daily. 04/10/18  Yes Nat Christen, MD  ondansetron (ZOFRAN) 4 MG tablet Take 1 tablet (4 mg total) by mouth every 8 (eight) hours as needed for nausea or vomiting. 04/22/18  Yes Lockamy, Randi L, NP-C  pantoprazole (PROTONIX) 40 MG tablet Take 1 tablet (40 mg total) by mouth daily. 05/09/18  Yes Lockamy, Randi L, NP-C  polyethylene glycol (MIRALAX / GLYCOLAX) packet Take 17 g by mouth daily. 05/23/18 06/22/18 Yes Johnson, Clanford L, MD  prochlorperazine (COMPAZINE) 10 MG tablet Take 1 tablet (10 mg total) by mouth every 6 (six) hours as needed (Nausea or vomiting). 04/15/18  Yes [name], MD  trolamine salicylate (ASPERCREME) 10 % cream Apply 1 application topically daily as needed for muscle pain.   Yes [provider]     Allergies:     Allergies  Allergen Reactions  . Morphine And Related Nausea And Vomiting     Physical Exam:   Vitals  Blood pressure 139/77, pulse (!) 113, temperature 99.4 F (37.4 C), temperature source Oral, resp. rate (!) 23, height 5' 5"  (1.651 m), weight 71.7 kg, SpO2 94 %.   1. General lying in bed in itching  2. Normal affect and  insight, Not Suicidal or Homicidal, Awake Alert, Oriented X 3.  3. No F.N deficits, ALL C.Nerves Intact, Strength 5/5 all  4 extremities, Sensation intact all 4 extremities, Plantars down going.  4. Ears and Eyes appear Normal, Conjunctivae clear, PERRLA. Moist Oral Mucosa.  5. Supple Neck, No JVD, No cervical lymphadenopathy appriciated, No Carotid Bruits.  6. Symmetrical Chest wall movement, Good air movement bilaterally, CTAB.  7. Tachy s1, s2, no m/g/r  8. Positive Bowel Sounds, Abdomen Soft, No tenderness, No organomegaly appriciated,No rebound -guarding or rigidity.  9.  No Cyanosis, Normal Skin Turgor, + maculopapular rash over the chest, abdomen and back.   10. Good muscle tone,  joints appear normal , no effusions, Normal ROM.  11. No Palpable Lymph Nodes in Neck or Axillae  Chest pain left sided with palpation     Data Review:    CBC Recent Labs  Lab 05/22/18 1012 05/23/18 0511 05/24/18 1421 05/25/18 0343 05/25/18 2115 05/26/18 0518 05/27/18 1457  WBC 12.7* 13.6* 11.8* 12.2*  --  14.3* 14.3*  HGB 8.6* 8.6* 7.8* 7.3* 9.3* 9.6* 11.0*  HCT 26.0* 26.7* 23.7* 21.6* 27.2* 29.0* 34.3*  PLT 133* 125* 111* 131*  --  150 214  MCV 92.9 94.7 92.6 93.1  --  91.2 92.5  MCH 30.7 30.5 30.5 31.5  --  30.2 29.6  MCHC 33.1 32.2 32.9 33.8  --  33.1 32.1  RDW 15.2 15.6* 15.9* 16.5*  --  17.1* 17.8*  LYMPHSABS 0.6 1.0 1.9  --   --  2.4  --   MONOABS 0.5 0.5 0.8  --   --  1.4  --   EOSABS 0.7 1.0* 0.8*  --   --  1.6  --   BASOSABS 0.0 0.0 0.1  --   --  0.0  --    ------------------------------------------------------------------------------------------------------------------  Chemistries  Recent Labs  Lab 05/22/18 1012 05/23/18 0511 05/24/18 1421 05/25/18 0343 05/26/18 0518 05/27/18 1457  NA 132* 137 134* 135 134* 134*  K 4.0 4.3 3.1* 4.0 4.4 3.9  CL 99 105 104 109 105 103  CO2 22 23 21* 22 23 23   GLUCOSE 149* 165* 236* 129* 172* 207*  BUN 23 21 22 18 16 17     CREATININE 2.28* 2.20* 2.08* 1.95* 1.72* 1.83*  CALCIUM 6.7* 6.5* 7.0* 6.6* 6.6* 7.5*  MG  --   --  1.4* 1.7 1.4* 1.7  AST 22 25 23   --  25  --   ALT 38 48* 46*  --  47*  --   ALKPHOS 122 128* 125  --  169*  --   BILITOT 0.5 0.6 0.7  --  0.6  --    ------------------------------------------------------------------------------------------------------------------ estimated creatinine clearance is 28.4 mL/min (A) (by C-G formula based on SCr of 1.83 mg/dL (H)). ------------------------------------------------------------------------------------------------------------------ No results for input(s): TSH, T4TOTAL, T3FREE, THYROIDAB in the last 72 hours.  Invalid input(s): FREET3  Coagulation profile Recent Labs  Lab 05/22/18 1012  INR 1.18   ------------------------------------------------------------------------------------------------------------------- No results for input(s): DDIMER in the last 72 hours. -------------------------------------------------------------------------------------------------------------------  Cardiac Enzymes Recent Labs  Lab 05/24/18 1421 05/24/18 2031 05/25/18 0342  TROPONINI <0.03 <0.03 <0.03   ------------------------------------------------------------------------------------------------------------------ No results found for: BNP   ---------------------------------------------------------------------------------------------------------------  Urinalysis    Component Value Date/Time   COLORURINE YELLOW 05/27/2018 [place] 05/27/2018 1645   LABSPEC 1.006 05/27/2018 1645   PHURINE 5.0 05/27/2018 1645   GLUCOSEU >=500 (A) 05/27/2018 1645   HGBUR NEGATIVE 05/27/2018 1645   BILIRUBINUR NEGATIVE 05/27/2018 1645   KETONESUR NEGATIVE 05/27/2018 1645   PROTEINUR NEGATIVE 05/27/2018 1645   NITRITE NEGATIVE 05/27/2018  [place] 05/27/2018 1645     ----------------------------------------------------------------------------------------------------------------   Imaging Results:    Dg Chest 2 View  Result Date: 05/27/2018 CLINICAL DATA:  Multiple myeloma.  Dysphagia. EXAM: CHEST - 2 VIEW COMPARISON:  May 26, 2018 FINDINGS: Port-A-Cath tip is in the superior vena cava. No pneumothorax. No edema or consolidation. Heart size and pulmonary vascularity are normal. No adenopathy. There is an expansile lesion along the superior, medial left scapula. There is widespread lytic bony abnormality consistent with known multiple myeloma. IMPRESSION: Extensive bony changes of multiple myeloma. No edema or consolidation. No evident adenopathy. Electronically Signed   By: Lowella Grip III M.D.   On: 05/27/2018 15:40   Dg Abd Acute W/chest  Result Date: 05/26/2018 CLINICAL DATA:  Loose stools.  History of multiple myeloma. EXAM: DG ABDOMEN ACUTE W/ 1V CHEST COMPARISON:  Chest radiograph, 05/24/2018. Abdomen and pelvis CT, 05/20/2018. FINDINGS: Normal bowel gas pattern. Specifically, no evidence of obstruction, generalized adynamic ileus or free air. No evidence of renal or ureteral stones. Skin staples are noted overlying the central abdomen and lower pelvis. Soft tissues are otherwise unremarkable. Cardiac silhouette is normal in size. No mediastinal or hilar masses. Lungs show prominent bronchovascular and interstitial markings, but no evidence of pneumonia or pulmonary edema and no change. Right anterior chest wall Port-A-Cath is stable. Skeletal structures are demineralized with numerous well-defined lytic lesions consistent with the given history of multiple myeloma. IMPRESSION: 1. No acute findings. No evidence of bowel obstruction, generalized adynamic ileus or free air. 2. No acute cardiopulmonary disease. Electronically Signed   By: Lajean Manes M.D.   On: 05/26/2018 15:06    ekg sinus tach at 110 , nl axis, t inversion in avl, 3,     Assessment & Plan:    Active Problems:   Rash    Rash STOP Cipro Solumedrol 86m iv q8h Benadryl 216miv q6h prn   Sinus tachycardia (chronic, prior VQ low prob for PE) Chest pain (reproducible with palpation) Check tsh Check trop I q6h x3 Check cardiac echo  Hyperglycemia Check hga1c  Anemia Check cbc in am  CKD stage3 Check cmp in am  MM Please call Dr. KaHazle Quanto look at rash in AM to determine if from Revlemid or not  Hypertension Cont Lisinopril  Gerd Cont PPI   DVT Prophylaxis   Lovenox - SCDs  AM Labs Ordered, also please review Full Orders  Family Communication: Admission, patients condition and plan of care including tests being ordered have been discussed with the patient  who indicate understanding and agree with the plan and Code Status.  Code Status  FULL CODE  Likely DC to  home  Condition GUARDED    Consults called: none  Admission status: observation   Time spent in minutes : 70   JaJani Gravel.D on 05/27/2018 at 9:06 PM  Between 7am to 7pm - Pager - [phone] After 7pm go to [email or web address] - password [email or web address] - Office  [phone]

## 2018-05-27 NOTE — ED Triage Notes (Signed)
Pt was given 2 units of blood on Sunday,  Pt states she told the RN that she was developing a rash, given benadryl and finished the two units.  Pt c/o of rash all over chest, difficulty swallowing, and tingling in mouth since Sunday but worsening this morning.  Pt had her ABX changed to cipro yesterday and is receiving chemo for cancer. Last chemo tx 9/27.   Pt took 25mg  of benadryl at 0200 this am.

## 2018-05-27 NOTE — Telephone Encounter (Signed)
Spoke with the patient regarding her blood transfusion over the weekend and now rash.  Patient stated she had a blood transfusion on Saturday (October 5th) and woke with a rash Monday morning (October 7th).  The patient also stated she has had chills but has not checked her temperature, and SOB.  The patient took Benadryl 25mg  at 0200 but has not taken one since then but getting ready to take another tablet due to the itching and rash worsening.  Patient denied back pain but still having abdominal pain that is not new for her.  Instructed the patient to come to the emergency room for the SOB, rash, and chills or she could call her PCP.  Patient verbalized understanding.

## 2018-05-27 NOTE — ED Provider Notes (Signed)
[facility] EMERGENCY DEPARTMENT Provider Note   CSN: [id number] Arrival date & time: 05/27/18  1349     History   Chief Complaint Chief Complaint  Patient presents with  . Chest Pain  . Allergic Reaction    HPI Alexandra Price is a 72 y.o. female.  Pt presents to the ED today with a rash.  The pt has a hx of multiple myeloma not in remission.  She is followed by Dr. Delton Coombes and had last chemo on 9/26.  She had acute appendicitis on 10/1 and had an appendectomy on 10/1.  She was re-admitted to the hospital from 10/5 to 10/7 for CP.  She had a VQ scan that was negative.  She had a UTI that grew out pseudomonas and was put on cipro.  She also had a blood transfusion (2 units) on 10/5 for anemia.  She had low potassium and magnesium which was replaced.  The pt developed a rash yesterday.  She took a 25 mg benadryl at 0200 today.  Nothing since then.  The pt said she feels like she is not getting enough air.     Past Medical History:  Diagnosis Date  . Anxiety   . Depression   . Hypertension   . Multiple myeloma ([place])    multiple myeloma    Patient Active Problem List   Diagnosis Date Noted  . Rash 05/27/2018  . Pseudomonas infection 05/26/2018  . SOB (shortness of breath) 05/24/2018  . CAP (community acquired pneumonia) 05/23/2018  . Fever 05/22/2018  . Sinus tachycardia 05/22/2018  . CKD (chronic kidney disease) 05/22/2018  . S/P laparoscopic appendectomy 05/20/2018  . Multiple myeloma without remission ([place]) 04/15/2018  . Goals of care, counseling/discussion 04/15/2018  . Multiple myeloma not having achieved remission ([place]) 04/07/2018    Past Surgical History:  Procedure Laterality Date  . ABDOMINAL HYSTERECTOMY     total  . LAPAROSCOPIC APPENDECTOMY N/A 05/20/2018   Procedure: APPENDECTOMY LAPAROSCOPIC;  Surgeon: [name], MD;  Location: [facility];  Service: General;  Laterality: N/A;  . PORTACATH PLACEMENT Right 04/14/2018   Procedure: INSERTION  PORT-A-CATH;  Surgeon: [name], MD;  Location: [facility];  Service: General;  Laterality: Right;     OB History   None      Home Medications    Prior to Admission medications   Medication Sig Start Date End Date Taking? Authorizing Provider  acyclovir (ZOVIRAX) 400 MG tablet Take 1 tablet (400 mg total) by mouth 2 (two) times daily. 05/23/18  Yes Johnson, Clanford L, MD  allopurinol (ZYLOPRIM) 300 MG tablet Take 1 tablet (300 mg total) by mouth daily. 04/24/18  Yes [name], MD  ALPRAZolam Duanne Moron) 0.25 MG tablet Take 1 tablet (0.25 mg total) by mouth 2 (two) times daily as needed for anxiety. 05/15/18  Yes [name], MD  aspirin EC 81 MG tablet Take 81 mg by mouth daily.   Yes [provider]  Calcium Carb-Cholecalciferol (CALCIUM 1000 + D PO) Take 1,000 mg by mouth daily. Take one tablet daily until next visit with oncologist.   Yes [provider]  ciprofloxacin (CIPRO) 250 MG tablet Take 1 tablet (250 mg total) by mouth 2 (two) times daily for 5 days. 05/26/18 05/31/18 Yes Johnson, Clanford L, MD  feeding supplement, ENSURE ENLIVE, (ENSURE ENLIVE) LIQD Take 237 mLs by mouth 2 (two) times daily between meals for 14 days. 05/24/18 06/07/18 Yes Johnson, Clanford L, MD  HYDROcodone-acetaminophen (NORCO) 5-325 MG tablet Take 1 tablet  by mouth 2 (two) times daily. 05/15/18  Yes Lockamy, Randi L, NP-C  lidocaine-prilocaine (EMLA) cream Apply to affected area once 04/15/18  Yes [name], MD  lisinopril (PRINIVIL,ZESTRIL) 10 MG tablet Take 1 tablet (10 mg total) by mouth daily. 04/10/18  Yes Nat Christen, MD  ondansetron (ZOFRAN) 4 MG tablet Take 1 tablet (4 mg total) by mouth every 8 (eight) hours as needed for nausea or vomiting. 04/22/18  Yes Lockamy, Randi L, NP-C  pantoprazole (PROTONIX) 40 MG tablet Take 1 tablet (40 mg total) by mouth daily. 05/09/18  Yes Lockamy, Randi L, NP-C  polyethylene glycol (MIRALAX / GLYCOLAX) packet Take 17 g by mouth  daily. 05/23/18 06/22/18 Yes Johnson, Clanford L, MD  prochlorperazine (COMPAZINE) 10 MG tablet Take 1 tablet (10 mg total) by mouth every 6 (six) hours as needed (Nausea or vomiting). 04/15/18  Yes [name], MD  trolamine salicylate (ASPERCREME) 10 % cream Apply 1 application topically daily as needed for muscle pain.   Yes [provider]    Family History Family History  Problem Relation Age of Onset  . Heart disease Mother   . Emphysema Father   . Diabetes Sister   . Cancer Brother        liver, lung, and colon  . Diabetes Brother     Social History Social History   Tobacco Use  . Smoking status: Never Smoker  . Smokeless tobacco: Never Used  Substance Use Topics  . Alcohol use: Never    Frequency: Never  . Drug use: Not Currently     Allergies   Morphine and related   Review of Systems Review of Systems  Respiratory: Positive for shortness of breath.   Skin: Positive for rash.  All other systems reviewed and are negative.    Physical Exam Updated Vital Signs BP 137/80   Pulse (!) 109   Temp 99 F (37.2 C) (Oral)   Resp 20   Ht _0  (1.676 m)   Wt 77.8 kg   SpO2 96%   BMI 27.68 kg/m   Physical Exam  Constitutional: She is oriented to person, place, and time. She appears well-developed and well-nourished.  HENT:  Head: Normocephalic and atraumatic.  Eyes: Pupils are equal, round, and reactive to light. EOM are normal.  Neck: Normal range of motion. Neck supple.  Cardiovascular: Intact distal pulses and normal pulses. Tachycardia present.  Pulmonary/Chest: Effort normal and breath sounds normal.  Abdominal: Soft. Bowel sounds are normal.  Musculoskeletal: Normal range of motion.       Right lower leg: Normal.       Left lower leg: Normal.  Neurological: She is alert and oriented to person, place, and time.  Skin: Skin is warm and dry. Capillary refill takes less than 2 seconds.  Hives all over trunk  Psychiatric: She has a  normal mood and affect. Her behavior is normal.  Nursing note and vitals reviewed.    ED Treatments / Results  Labs (all labs ordered are listed, but only abnormal results are displayed) Labs Reviewed  CBC - Abnormal; Notable for the following components:      Result Value   WBC 14.3 (*)    RBC 3.71 (*)    Hemoglobin 11.0 (*)    HCT 34.3 (*)    RDW 17.8 (*)    All other components within normal limits  BASIC METABOLIC PANEL - Abnormal; Notable for the following components:   Sodium 134 (*)    Glucose, Bld  207 (*)    Creatinine, Ser 1.83 (*)    Calcium 7.5 (*)    GFR calc non Af Amer 27 (*)    GFR calc Af Amer 31 (*)    All other components within normal limits  URINALYSIS, ROUTINE W REFLEX MICROSCOPIC - Abnormal; Notable for the following components:   Glucose, UA >=500 (*)    All other components within normal limits  CULTURE, BLOOD (ROUTINE X 2)  CULTURE, BLOOD (ROUTINE X 2)  URINE CULTURE  MAGNESIUM  TROPONIN I  TROPONIN I  TROPONIN I  SAMPLE TO BLOOD BANK    EKG EKG Interpretation  Date/Time:  Tuesday May 27 2018 20:48:01 EDT Ventricular Rate:  109 PR Interval:    QRS Duration: 91 QT Interval:  341 QTC Calculation: 460 R Axis:   88 Text Interpretation:  Sinus tachycardia Borderline right axis deviation Nonspecific repol abnormality, inferior leads Since last tracing rate faster Confirmed by Isla Pence [phone]) on 05/27/2018 8:53:43 PM   Radiology Dg Chest 2 View  Result Date: 05/27/2018 CLINICAL DATA:  Multiple myeloma.  Dysphagia. EXAM: CHEST - 2 VIEW COMPARISON:  May 26, 2018 FINDINGS: Port-A-Cath tip is in the superior vena cava. No pneumothorax. No edema or consolidation. Heart size and pulmonary vascularity are normal. No adenopathy. There is an expansile lesion along the superior, medial left scapula. There is widespread lytic bony abnormality consistent with known multiple myeloma. IMPRESSION: Extensive bony changes of multiple myeloma. No  edema or consolidation. No evident adenopathy. Electronically Signed   By: Lowella Grip III M.D.   On: 05/27/2018 15:40   Dg Abd Acute W/chest  Result Date: 05/26/2018 CLINICAL DATA:  Loose stools.  History of multiple myeloma. EXAM: DG ABDOMEN ACUTE W/ 1V CHEST COMPARISON:  Chest radiograph, 05/24/2018. Abdomen and pelvis CT, 05/20/2018. FINDINGS: Normal bowel gas pattern. Specifically, no evidence of obstruction, generalized adynamic ileus or free air. No evidence of renal or ureteral stones. Skin staples are noted overlying the central abdomen and lower pelvis. Soft tissues are otherwise unremarkable. Cardiac silhouette is normal in size. No mediastinal or hilar masses. Lungs show prominent bronchovascular and interstitial markings, but no evidence of pneumonia or pulmonary edema and no change. Right anterior chest wall Port-A-Cath is stable. Skeletal structures are demineralized with numerous well-defined lytic lesions consistent with the given history of multiple myeloma. IMPRESSION: 1. No acute findings. No evidence of bowel obstruction, generalized adynamic ileus or free air. 2. No acute cardiopulmonary disease. Electronically Signed   By: Lajean Manes M.D.   On: 05/26/2018 15:06    Procedures Procedures (including critical care time)  Medications Ordered in ED Medications  diphenhydrAMINE (BENADRYL) injection 25 mg (25 mg Intravenous Given 05/27/18 1504)  methylPREDNISolone sodium succinate (SOLU-MEDROL) 125 mg/2 mL injection 125 mg (125 mg Intravenous Given 05/27/18 1503)  famotidine (PEPCID) IVPB 20 mg premix (0 mg Intravenous Stopped 05/27/18 1533)  sodium chloride 0.9 % bolus 1,000 mL (0 mLs Intravenous Stopped 05/27/18 1605)  ipratropium-albuterol (DUONEB) 0.5-2.5 (3) MG/3ML nebulizer solution 3 mL (3 mLs Nebulization Given 05/27/18 1521)  LORazepam (ATIVAN) injection 1 mg (1 mg Intravenous Given 05/27/18 1710)  hydrOXYzine (ATARAX/VISTARIL) tablet 25 mg (25 mg Oral Given 05/27/18 1710)   sodium chloride 0.9 % bolus 1,000 mL (0 mLs Intravenous Stopped 05/27/18 2031)  diphenhydrAMINE (BENADRYL) injection 50 mg (50 mg Intravenous Given 05/27/18 1759)  LORazepam (ATIVAN) injection 1 mg (1 mg Intravenous Given 05/27/18 1944)     Initial Impression / Assessment and Plan / ED Course  I have reviewed the triage vital signs and the nursing notes.  Pertinent labs & imaging results that were available during my care of the patient were reviewed by me and considered in my medical decision making (see chart for details).    Pt's rash has improved a little bit, but not a whole lot.  She is still tachycardic despite IVFs.  She has a low grade fever, but no source.  Blood cultures are pending.  She has been on multiple meds in the last two weeks, but the latest is cipro which she's taken before without problems.    Pt d/w Dr. Delton Coombes (oncology) who does not think it is from the [facility], but will see pt in the am if needed.    Pt d/w Dr. Maudie Mercury (triad) for admission.  CRITICAL CARE Performed by: Isla Pence   Total critical care time: 45 minutes  Critical care time was exclusive of separately billable procedures and treating other patients.  Critical care was necessary to treat or prevent imminent or life-threatening deterioration.  Critical care was time spent personally by me on the following activities: development of treatment plan with patient and/or surrogate as well as nursing, discussions with consultants, evaluation of patient's response to treatment, examination of patient, obtaining history from patient or surrogate, ordering and performing treatments and interventions, ordering and review of laboratory studies, ordering and review of radiographic studies, pulse oximetry and re-evaluation of patient's condition.  Final Clinical Impressions(s) / ED Diagnoses   Final diagnoses:  Allergic reaction, initial encounter  Tachycardia  Multiple myeloma not having achieved  remission [facility])    ED Discharge Orders    None       Isla Pence, MD 05/27/18 2143

## 2018-05-28 ENCOUNTER — Other Ambulatory Visit (HOSPITAL_COMMUNITY): Payer: Self-pay | Admitting: Nurse Practitioner

## 2018-05-28 ENCOUNTER — Observation Stay (HOSPITAL_BASED_OUTPATIENT_CLINIC_OR_DEPARTMENT_OTHER): Payer: Medicaid Other

## 2018-05-28 ENCOUNTER — Telehealth (HOSPITAL_COMMUNITY): Payer: Self-pay | Admitting: General Practice

## 2018-05-28 ENCOUNTER — Ambulatory Visit (HOSPITAL_COMMUNITY): Payer: Self-pay | Admitting: Hematology

## 2018-05-28 DIAGNOSIS — R079 Chest pain, unspecified: Secondary | ICD-10-CM

## 2018-05-28 DIAGNOSIS — N184 Chronic kidney disease, stage 4 (severe): Secondary | ICD-10-CM

## 2018-05-28 DIAGNOSIS — C9000 Multiple myeloma not having achieved remission: Principal | ICD-10-CM

## 2018-05-28 DIAGNOSIS — T7840XA Allergy, unspecified, initial encounter: Secondary | ICD-10-CM | POA: Diagnosis not present

## 2018-05-28 DIAGNOSIS — R739 Hyperglycemia, unspecified: Secondary | ICD-10-CM | POA: Diagnosis not present

## 2018-05-28 DIAGNOSIS — F419 Anxiety disorder, unspecified: Secondary | ICD-10-CM | POA: Diagnosis present

## 2018-05-28 DIAGNOSIS — I10 Essential (primary) hypertension: Secondary | ICD-10-CM | POA: Diagnosis not present

## 2018-05-28 DIAGNOSIS — R21 Rash and other nonspecific skin eruption: Secondary | ICD-10-CM | POA: Diagnosis not present

## 2018-05-28 LAB — CBC WITH DIFFERENTIAL/PLATELET
BASOS ABS: 0 10*3/uL (ref 0.0–0.1)
Basophils Absolute: 0 10*3/uL (ref 0.0–0.1)
Basophils Absolute: 0 10*3/uL (ref 0.0–0.1)
Basophils Relative: 0 %
Basophils Relative: 0 %
Basophils Relative: 0 %
EOS ABS: 0.7 10*3/uL (ref 0.0–0.7)
EOS ABS: 1.6 10*3/uL — AB (ref 0.0–0.7)
EOS PCT: 3 %
Eosinophils Absolute: 0.4 10*3/uL (ref 0.0–0.7)
Eosinophils Relative: 6 %
Eosinophils Relative: 11 %
HCT: 29 % — ABNORMAL LOW (ref 36.0–46.0)
HEMATOCRIT: 27.7 % — AB (ref 36.0–46.0)
HEMATOCRIT: 26 % — AB (ref 36.0–46.0)
HEMOGLOBIN: 9 g/dL — AB (ref 12.0–15.0)
Hemoglobin: 8.6 g/dL — ABNORMAL LOW (ref 12.0–15.0)
Hemoglobin: 9.6 g/dL — ABNORMAL LOW (ref 12.0–15.0)
LYMPHS ABS: 2.4 10*3/uL (ref 0.7–4.0)
LYMPHS PCT: 3 %
LYMPHS PCT: 17 %
Lymphocytes Relative: 5 %
Lymphs Abs: 0.4 10*3/uL — ABNORMAL LOW (ref 0.7–4.0)
Lymphs Abs: 0.6 10*3/uL — ABNORMAL LOW (ref 0.7–4.0)
MCH: 30.4 pg (ref 26.0–34.0)
MCH: 30.7 pg (ref 26.0–34.0)
MCH: 30.2 pg (ref 26.0–34.0)
MCHC: 32.5 g/dL (ref 30.0–36.0)
MCHC: 33.1 g/dL (ref 30.0–36.0)
MCHC: 33.1 g/dL (ref 30.0–36.0)
MCV: 93.6 fL (ref 78.0–100.0)
MCV: 92.9 fL (ref 78.0–100.0)
MCV: 91.2 fL (ref 78.0–100.0)
MONO ABS: 0.5 10*3/uL (ref 0.1–1.0)
MONOS PCT: 4 %
MONOS PCT: 10 %
Monocytes Absolute: 0.3 10*3/uL (ref 0.1–1.0)
Monocytes Absolute: 1.4 10*3/uL — ABNORMAL HIGH (ref 0.1–1.0)
Monocytes Relative: 2 %
NEUTROS ABS: 13.7 10*3/uL — AB (ref 1.7–7.7)
NEUTROS ABS: 10.8 10*3/uL — AB (ref 1.7–7.7)
NEUTROS PCT: 85 %
NEUTROS PCT: 62 %
Neutro Abs: 8.9 10*3/uL — ABNORMAL HIGH (ref 1.7–7.7)
Neutrophils Relative %: 92 %
PLATELETS: 133 10*3/uL — AB (ref 150–400)
PLATELETS: 150 10*3/uL (ref 150–400)
Platelets: 170 10*3/uL (ref 150–400)
RBC: 2.96 MIL/uL — AB (ref 3.87–5.11)
RBC: 2.8 MIL/uL — ABNORMAL LOW (ref 3.87–5.11)
RBC: 3.18 MIL/uL — AB (ref 3.87–5.11)
RDW: 14.6 % (ref 11.5–15.5)
RDW: 15.2 % (ref 11.5–15.5)
RDW: 17.1 % — ABNORMAL HIGH (ref 11.5–15.5)
WBC: 14.8 10*3/uL — AB (ref 4.0–10.5)
WBC: 12.7 10*3/uL — ABNORMAL HIGH (ref 4.0–10.5)
WBC: 14.3 10*3/uL — ABNORMAL HIGH (ref 4.0–10.5)

## 2018-05-28 LAB — COMPREHENSIVE METABOLIC PANEL
ALBUMIN: 2.4 g/dL — AB (ref 3.5–5.0)
ALK PHOS: 221 U/L — AB (ref 38–126)
ALT: 31 U/L (ref 0–44)
ANION GAP: 6 (ref 5–15)
AST: 16 U/L (ref 15–41)
BILIRUBIN TOTAL: 0.5 mg/dL (ref 0.3–1.2)
BUN: 19 mg/dL (ref 8–23)
CALCIUM: 6.9 mg/dL — AB (ref 8.9–10.3)
CO2: 22 mmol/L (ref 22–32)
Chloride: 107 mmol/L (ref 98–111)
Creatinine, Ser: 1.84 mg/dL — ABNORMAL HIGH (ref 0.44–1.00)
GFR calc non Af Amer: 27 mL/min — ABNORMAL LOW (ref >60)
GFR, EST AFRICAN AMERICAN: 31 mL/min — AB (ref >60)
GLUCOSE: 350 mg/dL — AB (ref 70–99)
Potassium: 5 mmol/L (ref 3.5–5.1)
SODIUM: 135 mmol/L (ref 135–145)
Total Protein: 5.4 g/dL — ABNORMAL LOW (ref 6.5–8.1)

## 2018-05-28 LAB — GLUCOSE, CAPILLARY
GLUCOSE-CAPILLARY: 417 mg/dL — AB (ref 70–99)
GLUCOSE-CAPILLARY: 284 mg/dL — AB (ref 70–99)
Glucose-Capillary: 375 mg/dL — ABNORMAL HIGH (ref 70–99)
Glucose-Capillary: 187 mg/dL — ABNORMAL HIGH (ref 70–99)

## 2018-05-28 LAB — TSH: TSH: 0.257 u[IU]/mL — AB (ref 0.350–4.500)

## 2018-05-28 LAB — CBC
HCT: 29.3 % — ABNORMAL LOW (ref 36.0–46.0)
Hemoglobin: 9.3 g/dL — ABNORMAL LOW (ref 12.0–15.0)
MCH: 29.6 pg (ref 26.0–34.0)
MCHC: 31.7 g/dL (ref 30.0–36.0)
MCV: 93.3 fL (ref 80.0–100.0)
NRBC: 0 % (ref 0.0–0.2)
PLATELETS: 179 10*3/uL (ref 150–400)
RBC: 3.14 MIL/uL — AB (ref 3.87–5.11)
RDW: 17.6 % — ABNORMAL HIGH (ref 11.5–15.5)
WBC: 10.7 10*3/uL — ABNORMAL HIGH (ref 4.0–10.5)

## 2018-05-28 LAB — TROPONIN I
Troponin I: <0.03 ng/mL (ref <0.03)
Troponin I: <0.03 ng/mL (ref <0.03)

## 2018-05-28 LAB — ECHOCARDIOGRAM COMPLETE
Height: 66 in
WEIGHTICAEL: 2733.7 [oz_av]

## 2018-05-28 MED ORDER — HYDROCORTISONE 1 % EX CREA
TOPICAL_CREAM | Freq: Two times a day (BID) | CUTANEOUS | Status: DC
  Administered 2018-05-28: 1 via TOPICAL
  Administered 2018-05-29: 08:00:00 via TOPICAL
  Filled 2018-05-28 (×2): qty 28

## 2018-05-28 MED ORDER — PREDNISONE 20 MG PO TABS
50.0000 mg | ORAL_TABLET | Freq: Every day | ORAL | Status: DC
  Administered 2018-05-29: 50 mg via ORAL
  Filled 2018-05-28: qty 2

## 2018-05-28 MED ORDER — CALCIUM CARBONATE-VITAMIN D 500-200 MG-UNIT PO TABS
2.0000 | ORAL_TABLET | Freq: Every day | ORAL | Status: DC
  Administered 2018-05-29: 2 via ORAL
  Filled 2018-05-28: qty 2

## 2018-05-28 MED ORDER — INSULIN ASPART 100 UNIT/ML ~~LOC~~ SOLN
0.0000 [IU] | Freq: Three times a day (TID) | SUBCUTANEOUS | Status: DC
  Administered 2018-05-28: 20 [IU] via SUBCUTANEOUS
  Administered 2018-05-28 – 2018-05-29 (×2): 4 [IU] via SUBCUTANEOUS
  Administered 2018-05-29: 11 [IU] via SUBCUTANEOUS

## 2018-05-28 MED ORDER — LACTATED RINGERS IV SOLN
INTRAVENOUS | Status: DC
  Administered 2018-05-28 – 2018-05-29 (×2): via INTRAVENOUS

## 2018-05-28 MED ORDER — INSULIN ASPART 100 UNIT/ML ~~LOC~~ SOLN
0.0000 [IU] | Freq: Every day | SUBCUTANEOUS | Status: DC
  Administered 2018-05-28: 3 [IU] via SUBCUTANEOUS

## 2018-05-28 MED ORDER — PREDNISONE 20 MG PO TABS: 40.0000 mg | ORAL_TABLET | Freq: Every day | ORAL | Status: DC

## 2018-05-28 NOTE — Progress Notes (Signed)
PROGRESS NOTE    Alexandra Price  QVZ:[id number] DOB: 1946-05-29 DOA: 05/27/2018 PCP: [facility]    Brief Narrative:  72 year old female with a history of multiple myeloma, chronic kidney disease stage IV, admitted to the hospital with maculopapular rash over her trunk and lower extremities.  She was recently taking antibiotics, ciprofloxacin and doxycycline.  It is felt that she likely has a drug rash.  Seen by oncology and it was felt unlikely that her symptoms are related to Revlimid or recent blood transfusion that she received.  She is been started on steroids.  If she remains stable, anticipate discharge in the next 24 hours.   Assessment & Plan:   Active Problems:   Multiple myeloma without remission (HCC)   CKD (chronic kidney disease) stage 4, GFR 15-29 ml/min (HCC)   Rash   Hyperglycemia   HTN (hypertension)   Anxiety   1. Maculopapular rash.  Suspect this is drug rash from recent antibiotics.  She was taking ciprofloxacin and doxycycline more recently.  These medications have been discontinued.  Seen by hematology/oncology and it was not felt that Revlimid is the cause of her symptoms.  She is continued on steroids.  Continue to monitor. 2. Chronic kidney disease stage IV.  Creatinine is currently at baseline.  We will continue to monitor. 3. Hyperglycemia.  Related to steroids.  Improving as steroids with taper. 4. Hypertension.  Currently on lisinopril.  Blood pressure stable. 5. Anxiety.  Continue on Xanax as needed. 6. Multiple myeloma.  Follow-up with oncology for further management.   DVT prophylaxis: Lovenox Code Status: Full code Family Communication: Family friend at the bedside Disposition Plan: Anticipate discharge home tomorrow   Consultants:   Hematology/oncology  Procedures:     Antimicrobials:       Subjective: Complains of itching and diffuse body rash.  No shortness of breath at this time.  Some shortness of  breath earlier when she first noticed the rash.  Objective: Vitals:   05/28/18 0604 05/28/18 0833 05/28/18 0943 05/28/18 1515  BP: 125/70  120/71 135/76  Pulse: 92   94  Resp: 18     Temp: 98.2 F (36.8 C)   98.5 F (36.9 C)  TempSrc: Oral   Oral  SpO2: 100% 100%  98%  Weight: 77.5 kg     Height:        Intake/Output Summary (Last 24 hours) at 05/28/2018 1834 Last data filed at 05/28/2018 0935 Gross per 24 hour  Intake 1243 ml  Output 1200 ml  Net 43 ml   Filed Weights   05/27/18 1415 05/27/18 2136 05/28/18 0604  Weight: 71.7 kg 77.8 kg 77.5 kg    Examination:  General exam: Appears calm and comfortable  Respiratory system: Clear to auscultation. Respiratory effort normal. Cardiovascular system: S1 & S2 heard, RRR. No JVD, murmurs, rubs, gallops or clicks. No pedal edema. Gastrointestinal system: Abdomen is nondistended, soft and nontender. No organomegaly or masses felt. Normal bowel sounds heard. Central nervous system: Alert and oriented. No focal neurological deficits. Extremities: Symmetric 5 x 5 power. Skin: Maculopapular rash over trunk and lower extremities Psychiatry: Judgement and insight appear normal. Mood & affect appropriate.     Data Reviewed: I have personally reviewed following labs and imaging studies  CBC: Recent Labs  Lab 05/22/18 1012 05/23/18 0511 05/24/18 1421 05/25/18 0343 05/25/18 2115 05/26/18 0518 05/27/18 1457 05/28/18 0249  WBC 12.7* 13.6* 11.8* 12.2*  --  14.3* 14.3* 10.7*  NEUTROABS  10.8* 11.1* 8.2*  --   --  8.9*  --   --   HGB 8.6* 8.6* 7.8* 7.3* 9.3* 9.6* 11.0* 9.3*  HCT 26.0* 26.7* 23.7* 21.6* 27.2* 29.0* 34.3* 29.3*  MCV 92.9 94.7 92.6 93.1  --  91.2 92.5 93.3  PLT 133* 125* 111* 131*  --  150 214 702   Basic Metabolic Panel: Recent Labs  Lab 05/24/18 1421 05/25/18 0343 05/26/18 0518 05/27/18 1457 05/28/18 0249  NA 134* 135 134* 134* 135  K 3.1* 4.0 4.4 3.9 5.0  CL 104 109 105 103 107  CO2 21* 22 23 23 22     GLUCOSE 236* 129* 172* 207* 350*  BUN 22 18 16 17 19   CREATININE 2.08* 1.95* 1.72* 1.83* 1.84*  CALCIUM 7.0* 6.6* 6.6* 7.5* 6.9*  MG 1.4* 1.7 1.4* 1.7  --    GFR: Estimated Creatinine Clearance: 29.9 mL/min (A) (by C-G formula based on SCr of 1.84 mg/dL (H)). Liver Function Tests: Recent Labs  Lab 05/22/18 1012 05/23/18 0511 05/24/18 1421 05/26/18 0518 05/28/18 0249  AST 22 25 23 25 16   ALT 38 48* 46* 47* 31  ALKPHOS 122 128* 125 169* 221*  BILITOT 0.5 0.6 0.7 0.6 0.5  PROT 6.9 6.3* 5.9* 5.7* 5.4*  ALBUMIN 2.6* 2.5* 2.4* 2.4* 2.4*   Recent Labs  Lab 05/22/18 1036  LIPASE 25   No results for input(s): AMMONIA in the last 168 hours. Coagulation Profile: Recent Labs  Lab 05/22/18 1012  INR 1.18   Cardiac Enzymes: Recent Labs  Lab 05/22/18 1457 05/23/18 0511  05/24/18 2031 05/25/18 0342 05/27/18 2108 05/28/18 0249 05/28/18 0915  CKTOTAL 18* 14*  --   --   --   --   --   --   TROPONINI  --   --    < > <0.03 <0.03 <0.03 <0.03 <0.03   < > = values in this interval not displayed.   BNP (last 3 results) No results for input(s): PROBNP in the last 8760 hours. HbA1C: No results for input(s): HGBA1C in the last 72 hours. CBG: Recent Labs  Lab 05/27/18 2155 05/28/18 1123 05/28/18 1302 05/28/18 1616  GLUCAP 246* 417* 375* 187*   Lipid Profile: No results for input(s): CHOL, HDL, LDLCALC, TRIG, CHOLHDL, LDLDIRECT in the last 72 hours. Thyroid Function Tests: Recent Labs    05/28/18 0249  TSH 0.257*   Anemia Panel: No results for input(s): VITAMINB12, FOLATE, FERRITIN, TIBC, IRON, RETICCTPCT in the last 72 hours. Sepsis Labs: Recent Labs  Lab 05/22/18 1032 05/22/18 1306 05/23/18 0723 05/24/18 1415  LATICACIDVEN 1.36 1.50 1.3 1.99*    Recent Results (from the past 240 hour(s))  Urine culture     Status: Abnormal   Collection Time: 05/20/18 10:40 AM  Result Value Ref Range Status   Specimen Description   Final    URINE, CLEAN CATCH Performed at  [facility], [address] [place], [place] [address]    Special Requests   Final    Normal Performed at [facility], [address], [place], [place] [address]    Culture >=100,000 COLONIES/mL PSEUDOMONAS AERUGINOSA (A)  Final   Report Status 05/23/2018 FINAL  Final   Organism ID, Bacteria PSEUDOMONAS AERUGINOSA (A)  Final      Susceptibility   Pseudomonas aeruginosa - MIC*    CEFTAZIDIME 4 SENSITIVE Sensitive     CIPROFLOXACIN <=0.25 SENSITIVE Sensitive     GENTAMICIN <=1 SENSITIVE Sensitive     IMIPENEM 1 SENSITIVE Sensitive  PIP/TAZO 8 SENSITIVE Sensitive     CEFEPIME 2 SENSITIVE Sensitive     * >=100,000 COLONIES/mL PSEUDOMONAS AERUGINOSA  Blood Culture (routine x 2)     Status: None   Collection Time: 05/20/18 10:58 AM  Result Value Ref Range Status   Specimen Description BLOOD LEFT HAND  Final   Special Requests   Final    BOTTLES DRAWN AEROBIC AND ANAEROBIC Blood Culture adequate volume   Culture   Final    NO GROWTH 5 DAYS Performed at [facility], [address] [place], [place] [address]    Report Status 05/25/2018 FINAL  Final  Blood Culture (routine x 2)     Status: None   Collection Time: 05/20/18 11:06 AM  Result Value Ref Range Status   Specimen Description BLOOD LEFT HAND  Final   Special Requests   Final    BOTTLES DRAWN AEROBIC AND ANAEROBIC Blood Culture adequate volume   Culture   Final    NO GROWTH 5 DAYS Performed at [facility], [address] [place], [place] [address]    Report Status 05/25/2018 FINAL  Final  MRSA PCR Screening     Status: Abnormal   Collection Time: 05/20/18  5:31 PM  Result Value Ref Range Status   MRSA by PCR POSITIVE (A) NEGATIVE Final    Comment:        The GeneXpert MRSA Assay (FDA approved for NASAL specimens only), is one component of a comprehensive MRSA colonization surveillance program. It is not intended to diagnose MRSA infection nor to guide or monitor treatment for MRSA infections. RESULT CALLED TO,  READ BACK BY AND VERIFIED WITH: AMBURN,A AT 0653 BY HUFFINES,S ON 05/21/18. Performed at [facility], [address] [place], [place] [address]   Culture, blood (Routine x 2)     Status: None   Collection Time: 05/22/18 10:12 AM  Result Value Ref Range Status   Specimen Description BLOOD LEFT HAND DRAWN BY RN  Final   Special Requests   Final    BOTTLES DRAWN AEROBIC AND ANAEROBIC Blood Culture results may not be optimal due to an excessive volume of blood received in culture bottles   Culture   Final    NO GROWTH 5 DAYS Performed at [facility], [address], [place], [place] [address]    Report Status 05/27/2018 FINAL  Final  Culture, blood (Routine x 2)     Status: None   Collection Time: 05/22/18 10:12 AM  Result Value Ref Range Status   Specimen Description BLOOD RIGHT HAND  Final   Special Requests   Final    BOTTLES DRAWN AEROBIC AND ANAEROBIC Blood Culture adequate volume   Culture   Final    NO GROWTH 5 DAYS Performed at [facility], [address], [place], [place] [address]    Report Status 05/27/2018 FINAL  Final  Urine culture     Status: None   Collection Time: 05/22/18 12:28 PM  Result Value Ref Range Status   Specimen Description   Final    URINE, CATHETERIZED Performed at [facility], [address], [place], [place] [address]    Special Requests   Final    Normal Performed at [facility], [address], [place], [place] [address]    Culture   Final    NO GROWTH Performed at [facility], [place] [address] [place], [place] [address]    Report Status 05/24/2018 FINAL  Final  Blood Culture (routine x 2)  Status: None (Preliminary result)   Collection Time: 05/24/18  2:21 PM  Result Value Ref Range Status   Specimen Description BLOOD RIGHT ARM DRAWN BY RN  Final   Special Requests   Final    BOTTLES DRAWN AEROBIC AND ANAEROBIC Blood Culture adequate volume   Culture   Final    NO GROWTH 4 DAYS Performed at [facility], [address]  [place], [place] [address]    Report Status PENDING  Incomplete  Blood Culture (routine x 2)     Status: None (Preliminary result)   Collection Time: 05/24/18  3:00 PM  Result Value Ref Range Status   Specimen Description BLOOD LEFT HAND  Final   Special Requests   Final    BOTTLES DRAWN AEROBIC AND ANAEROBIC Blood Culture adequate volume   Culture   Final    NO GROWTH 4 DAYS Performed at [facility], [address], [place], [place] [address]    Report Status PENDING  Incomplete  Urine culture     Status: None   Collection Time: 05/24/18  8:01 PM  Result Value Ref Range Status   Specimen Description   Final    URINE, CLEAN CATCH Performed at [facility], [address], [place], [place] [address]    Special Requests   Final    NONE Performed at [facility], [address], [place], [place] [address]    Culture   Final    NO GROWTH Performed at [facility], [place] [address], [place], [place] [address]    Report Status 05/26/2018 FINAL  Final  C difficile quick scan w PCR reflex     Status: None   Collection Time: 05/25/18  8:00 AM  Result Value Ref Range Status   C Diff antigen NEGATIVE NEGATIVE Final   C Diff toxin NEGATIVE NEGATIVE Final   C Diff interpretation No C. difficile detected.  Final    Comment: Performed at [facility], [address], [place], [place] [address]  Culture, blood (routine x 2)     Status: None (Preliminary result)   Collection Time: 05/27/18  2:57 PM  Result Value Ref Range Status   Specimen Description BLOOD RIGHT ANTECUBITAL  Final   Special Requests   Final    BOTTLES DRAWN AEROBIC ONLY Blood Culture adequate volume   Culture   Final    NO GROWTH < 24 HOURS Performed at [facility], [address] [place], [place] [address]    Report Status PENDING  Incomplete  Culture, blood (routine x 2)     Status: None (Preliminary result)   Collection Time: 05/27/18  2:59 PM  Result Value Ref Range Status   Specimen Description BLOOD BLOOD RIGHT  WRIST  Final   Special Requests   Final    BOTTLES DRAWN AEROBIC AND ANAEROBIC Blood Culture adequate volume   Culture   Final    NO GROWTH < 24 HOURS Performed at [facility], [address], [place], [place] [address]    Report Status PENDING  Incomplete         Radiology Studies: Dg Chest 2 View  Result Date: 05/27/2018 CLINICAL DATA:  Multiple myeloma.  Dysphagia. EXAM: CHEST - 2 VIEW COMPARISON:  May 26, 2018 FINDINGS: Port-A-Cath tip is in the superior vena cava. No pneumothorax. No edema or consolidation. Heart size and pulmonary vascularity are normal. No adenopathy. There is an expansile lesion along the superior, medial left scapula. There is widespread lytic bony abnormality consistent  with known multiple myeloma. IMPRESSION: Extensive bony changes of multiple myeloma. No edema or consolidation. No evident adenopathy. Electronically Signed   By: Lowella Grip III M.D.   On: 05/27/2018 15:40        Scheduled Meds: . acyclovir  400 mg Oral BID  . allopurinol  300 mg Oral Daily  . aspirin EC  81 mg Oral Daily  . calcium-vitamin D  2 tablet Oral Q breakfast  . enoxaparin (LOVENOX) injection  30 mg Subcutaneous Q24H  . feeding supplement (ENSURE ENLIVE)  237 mL Oral BID BM  . HYDROcodone-acetaminophen  1 tablet Oral BID  . hydrocortisone cream   Topical BID  . insulin aspart  0-20 Units Subcutaneous TID WC  . insulin aspart  0-5 Units Subcutaneous QHS  . lisinopril  10 mg Oral Daily  . pantoprazole  40 mg Oral Daily  . polyethylene glycol  17 g Oral Daily  . [START ON 05/29/2018] predniSONE  50 mg Oral Q breakfast  . sodium chloride flush  3 mL Intravenous Q12H   Continuous Infusions: . sodium chloride       LOS: 0 days    Time spent: 75mns    JKathie Dike MD [facility] Pager [phone] If 7PM-7AM, please contact night-coverage [email or web address] Password [email or web address]10/04/2018, 6:34 PM

## 2018-05-28 NOTE — Progress Notes (Signed)
At 1123, Patient blood glucose check was 417. Dr. Roderic Palau made aware and ordered to give 20 units (Novolog) coverage and recheck BG one hour later. BG rechecked an hour later with result of 375. Dr. Roderic Palau made aware. No new orders. Patient alert and oriented and complained of the rash on her body itching even after IV Benadryl. Dr. Roderic Palau made aware and topical cream ordered and given with good effect per patient.

## 2018-05-28 NOTE — Telephone Encounter (Signed)
[name] CSW Progress Notes  Spoke w patient who is currently inpatient at [facility] after experiencing severe rash.  Reviewed appointments made with mental health providers - has cancelled evaluation at [facility], rescheduled [facility] appointment for November (provider will call back w earlier appt if possible). Will let nurse navigator know of patient current status.  Worried that she will not be able to manage on her own at home in [place]. "I don't know what to do." Depends on son for help but conflicted about asking for his help, husband goes between home in [facility] and family's home in [place].    Edwyna Shell, LCSW Clinical Social Worker Phone:  [phone]

## 2018-05-28 NOTE — Progress Notes (Signed)
*  PRELIMINARY RESULTS* Echocardiogram 2D Echocardiogram has been performed.  Leavy Cella 05/28/2018, 10:25 AM

## 2018-05-28 NOTE — Consult Note (Signed)
[facility] Consultation Oncology  Name: Alexandra Price      MRN: [id number]    Location: A303/A303-01  Date: 05/28/2018 Time:5:28 PM   REFERRING PHYSICIAN: Dr. Jolaine Artist  REASON FOR CONSULT: Erythematous maculopapular rash.   DIAGNOSIS: Drug-induced rash.  HISTORY OF PRESENT ILLNESS: Alexandra Price is seen in consultation today for history of rash in a patient with multiple myeloma.  She was diagnosed with IgG lambda multiple myeloma and was started on therapy with carfilzomib, cyclophosphamide and dexamethasone on 04/09/2018.  Her cycle 2 was changed to carfilzomib, Revlimid and dexamethasone on 05/15/2018.  She came into the ER on 05/20/2018 and was diagnosed with acute appendicitis and underwent appendectomy.  After that she had couple of more admissions to the hospital for various reasons.  She was started on antibiotic doxycycline during her most recent hospitalization.  She started noticing rash (while she was on doxycycline) on her chest on 05/25/2018 and was discharged home on 05/26/2018 with Cipro to be taken at home.  She reports intense itching of the rash.  This has spread to the rest of the trunk and lower extremities.  She denies any blister formation.  No rash in the mouth was reported.  She came to the ER last night and was admitted to the hospital.  Her myeloma treatments have been on hold since 05/16/2018.  PAST MEDICAL HISTORY:   Past Medical History:  Diagnosis Date  . Anxiety   . Depression   . Hypertension   . Multiple myeloma (HCC)    multiple myeloma    ALLERGIES: Allergies  Allergen Reactions  . Morphine And Related Nausea And Vomiting      MEDICATIONS: I have reviewed the patient's current medications.     PAST SURGICAL HISTORY Past Surgical History:  Procedure Laterality Date  . ABDOMINAL HYSTERECTOMY     total  . LAPAROSCOPIC APPENDECTOMY N/A 05/20/2018   Procedure: APPENDECTOMY LAPAROSCOPIC;  Surgeon: [name], MD;  Location: [facility];  Service:  General;  Laterality: N/A;  . PORTACATH PLACEMENT Right 04/14/2018   Procedure: INSERTION PORT-A-CATH;  Surgeon: [name], MD;  Location: [facility];  Service: General;  Laterality: Right;    FAMILY HISTORY: Family History  Problem Relation Age of Onset  . Heart disease Mother   . Emphysema Father   . Diabetes Sister   . Cancer Brother        liver, lung, and colon  . Diabetes Brother     SOCIAL HISTORY:  reports that she has never smoked. She has never used smokeless tobacco. She reports that she has current or past drug history. She reports that she does not drink alcohol.  PERFORMANCE STATUS: The patient's performance status is 1 - Symptomatic but completely ambulatory  PHYSICAL EXAM: Most Recent Vital Signs: Blood pressure 135/76, pulse 94, temperature 98.5 F (36.9 C), temperature source Oral, resp. rate 18, height 5' 6"  (1.676 m), weight 170 lb 13.7 oz (77.5 kg), SpO2 98 %. BP 135/76   Pulse 94   Temp 98.5 F (36.9 C) (Oral)   Resp 18   Ht 5' 6"  (1.676 m)   Wt 170 lb 13.7 oz (77.5 kg)   SpO2 98%   BMI 27.58 kg/m  Head: Normocephalic, without obvious abnormality, atraumatic Lungs: clear to auscultation bilaterally Heart: regular rate and rhythm, S1, S2 normal, no murmur, click, rub or gallop Abdomen: soft, non-tender; bowel sounds normal; no masses,  no organomegaly Extremities: extremities normal, atraumatic, no cyanosis or edema Skin: Diffuse erythematous maculopapular,  blanching rash on the trunk and proximal thighs. Lymph nodes: Cervical, supraclavicular, and axillary nodes normal. Neurologic: Alert and oriented X 3, normal strength and tone. Normal symmetric reflexes. Normal coordination and gait  LABORATORY DATA:  Results for orders placed or performed during the hospital encounter of 05/27/18 (from the past 48 hour(s))  Sample to Blood Bank     Status: None   Collection Time: 05/27/18  2:47 PM  Result Value Ref Range   Blood Bank Specimen SAMPLE AVAILABLE  FOR TESTING    Sample Expiration      05/28/2018 Performed at [facility], [address], [place], [place] [address]   CBC     Status: Abnormal   Collection Time: 05/27/18  2:57 PM  Result Value Ref Range   WBC 14.3 (H) 4.0 - 10.5 K/uL   RBC 3.71 (L) 3.87 - 5.11 MIL/uL   Hemoglobin 11.0 (L) 12.0 - 15.0 g/dL   HCT 34.3 (L) 36.0 - 46.0 %   MCV 92.5 80.0 - 100.0 fL   MCH 29.6 26.0 - 34.0 pg   MCHC 32.1 30.0 - 36.0 g/dL   RDW 17.8 (H) 11.5 - 15.5 %   Platelets 214 150 - 400 K/uL   nRBC 0.0 0.0 - 0.2 %    Comment: Performed at [facility], [address] [place], [place] [address]  Basic metabolic panel     Status: Abnormal   Collection Time: 05/27/18  2:57 PM  Result Value Ref Range   Sodium 134 (L) 135 - 145 mmol/L   Potassium 3.9 3.5 - 5.1 mmol/L   Chloride 103 98 - 111 mmol/L   CO2 23 22 - 32 mmol/L   Glucose, Bld 207 (H) 70 - 99 mg/dL   BUN 17 8 - 23 mg/dL   Creatinine, Ser 1.83 (H) 0.44 - 1.00 mg/dL   Calcium 7.5 (L) 8.9 - 10.3 mg/dL   GFR calc non Af Amer 27 (L) >60 mL/min   GFR calc Af Amer 31 (L) >60 mL/min    Comment: (NOTE) The eGFR has been calculated using the CKD EPI equation. This calculation has not been validated in all clinical situations. eGFR's persistently <60 mL/min signify possible Chronic Kidney Disease.    Anion gap 8 5 - 15    Comment: Performed at [facility], [address] [place], [place] [address]  Culture, blood (routine x 2)     Status: None (Preliminary result)   Collection Time: 05/27/18  2:57 PM  Result Value Ref Range   Specimen Description BLOOD RIGHT ANTECUBITAL    Special Requests      BOTTLES DRAWN AEROBIC ONLY Blood Culture adequate volume   Culture      NO GROWTH < 24 HOURS Performed at [facility], [address] [place], [place] [address]    Report Status PENDING   Magnesium     Status: None   Collection Time: 05/27/18  2:57 PM  Result Value Ref Range   Magnesium 1.7 1.7 - 2.4 mg/dL    Comment: Performed at [facility], [address], [place],  [address]  Culture, blood (routine x 2)     Status: None (Preliminary result)   Collection Time: 05/27/18  2:59 PM  Result Value Ref Range   Specimen Description BLOOD BLOOD RIGHT WRIST    Special Requests      BOTTLES DRAWN AEROBIC AND ANAEROBIC Blood Culture adequate volume   Culture      NO GROWTH < 24 HOURS Performed  at [facility], [address] [place], [place] [address]    Report Status PENDING   Urinalysis, Routine w reflex microscopic     Status: Abnormal   Collection Time: 05/27/18  4:45 PM  Result Value Ref Range   Color, Urine YELLOW YELLOW   APPearance CLEAR CLEAR   Specific Gravity, Urine 1.006 1.005 - 1.030   pH 5.0 5.0 - 8.0   Glucose, UA >=500 (A) NEGATIVE mg/dL   Hgb urine dipstick NEGATIVE NEGATIVE   Bilirubin Urine NEGATIVE NEGATIVE   Ketones, ur NEGATIVE NEGATIVE mg/dL   Protein, ur NEGATIVE NEGATIVE mg/dL   Nitrite NEGATIVE NEGATIVE   Leukocytes, UA NEGATIVE NEGATIVE   RBC / HPF 0-5 0 - 5 RBC/hpf   WBC, UA 0-5 0 - 5 WBC/hpf   Bacteria, UA NONE SEEN NONE SEEN   Squamous Epithelial / LPF 0-5 0 - 5    Comment: Performed at [facility], [address] [place], [place] [address]  Troponin I (q 6hr x 3)     Status: None   Collection Time: 05/27/18  9:08 PM  Result Value Ref Range   Troponin I <0.03 <0.03 ng/mL    Comment: Performed at [facility], [address] [place], [place] [address]  Glucose, capillary     Status: Abnormal   Collection Time: 05/27/18  9:55 PM  Result Value Ref Range   Glucose-Capillary 246 (H) 70 - 99 mg/dL   Comment 1 Notify RN    Comment 2 Document in Chart   Troponin I (q 6hr x 3)     Status: None   Collection Time: 05/28/18  2:49 AM  Result Value Ref Range   Troponin I <0.03 <0.03 ng/mL    Comment: Performed at [facility], [address] [place], [place] [address]  Comprehensive metabolic panel     Status: Abnormal   Collection Time: 05/28/18  2:49 AM  Result Value Ref Range   Sodium  135 135 - 145 mmol/L   Potassium 5.0 3.5 - 5.1 mmol/L    Comment: DELTA CHECK NOTED   Chloride 107 98 - 111 mmol/L   CO2 22 22 - 32 mmol/L   Glucose, Bld 350 (H) 70 - 99 mg/dL   BUN 19 8 - 23 mg/dL   Creatinine, Ser 1.84 (H) 0.44 - 1.00 mg/dL   Calcium 6.9 (L) 8.9 - 10.3 mg/dL   Total Protein 5.4 (L) 6.5 - 8.1 g/dL   Albumin 2.4 (L) 3.5 - 5.0 g/dL   AST 16 15 - 41 U/L   ALT 31 0 - 44 U/L   Alkaline Phosphatase 221 (H) 38 - 126 U/L   Total Bilirubin 0.5 0.3 - 1.2 mg/dL   GFR calc non Af Amer 27 (L) >60 mL/min   GFR calc Af Amer 31 (L) >60 mL/min    Comment: (NOTE) The eGFR has been calculated using the CKD EPI equation. This calculation has not been validated in all clinical situations. eGFR's persistently <60 mL/min signify possible Chronic Kidney Disease.    Anion gap 6 5 - 15    Comment: Performed at [facility], [address], [place], [place] [address]  CBC     Status: Abnormal   Collection Time: 05/28/18  2:49 AM  Result Value Ref Range   WBC 10.7 (H) 4.0 - 10.5 K/uL   RBC 3.14 (L) 3.87 - 5.11 MIL/uL   Hemoglobin 9.3 (L) 12.0 - 15.0 g/dL   HCT 29.3 (L) 36.0 - 46.0 %  MCV 93.3 80.0 - 100.0 fL   MCH 29.6 26.0 - 34.0 pg   MCHC 31.7 30.0 - 36.0 g/dL   RDW 17.6 (H) 11.5 - 15.5 %   Platelets 179 150 - 400 K/uL   nRBC 0.0 0.0 - 0.2 %    Comment: Performed at [facility], [address], [place], [place] [address]  TSH     Status: Abnormal   Collection Time: 05/28/18  2:49 AM  Result Value Ref Range   TSH 0.257 (L) 0.350 - 4.500 uIU/mL    Comment: Performed by a 3rd Generation assay with a functional sensitivity of <=0.01 uIU/mL. Performed at [facility], [address], [place], [place] [address]   Troponin I (q 6hr x 3)     Status: None   Collection Time: 05/28/18  9:15 AM  Result Value Ref Range   Troponin I <0.03 <0.03 ng/mL    Comment: Performed at [facility], [address], [place],  [address]  Glucose, capillary     Status: Abnormal   Collection  Time: 05/28/18 11:23 AM  Result Value Ref Range   Glucose-Capillary 417 (H) 70 - 99 mg/dL   Comment 1 Notify RN    Comment 2 Document in Chart   Glucose, capillary     Status: Abnormal   Collection Time: 05/28/18  1:02 PM  Result Value Ref Range   Glucose-Capillary 375 (H) 70 - 99 mg/dL   Comment 1 Notify RN    Comment 2 Document in Chart   Glucose, capillary     Status: Abnormal   Collection Time: 05/28/18  4:16 PM  Result Value Ref Range   Glucose-Capillary 187 (H) 70 - 99 mg/dL   Comment 1 Notify RN    Comment 2 Document in Chart       RADIOGRAPHY: Dg Chest 2 View  Result Date: 05/27/2018 CLINICAL DATA:  Multiple myeloma.  Dysphagia. EXAM: CHEST - 2 VIEW COMPARISON:  May 26, 2018 FINDINGS: Port-A-Cath tip is in the superior vena cava. No pneumothorax. No edema or consolidation. Heart size and pulmonary vascularity are normal. No adenopathy. There is an expansile lesion along the superior, medial left scapula. There is widespread lytic bony abnormality consistent with known multiple myeloma. IMPRESSION: Extensive bony changes of multiple myeloma. No edema or consolidation. No evident adenopathy. Electronically Signed   By: Lowella Grip III M.D.   On: 05/27/2018 15:40      ASSESSMENT and PLAN:  1.  IgG lambda multiple myeloma: -First cycle of carfilzomib, cyclophosphamide and dexamethasone on 04/17/2018 -Second cycle of carfilzomib, Revlimid and dexamethasone on 05/15/2018, last treatment of carfilzomib on 05/16/2018 -Revlimid discontinued end of September - We will plan to reinitiate her therapy approximately 4 weeks after her surgery.  She had appendectomy on 05/20/2018.  2.  Diffuse erythematous maculopapular rash: - This is highly likely drug-induced, mostly by an antibiotic either doxycycline or Cipro. -She is currently off of antibiotics. -I would recommend prednisone 50 mg daily for 5 days for control of itching.  All questions were answered. The patient knows to  call the clinic with any problems, questions or concerns. We can certainly see the patient much sooner if necessary.   [name]

## 2018-05-28 NOTE — Progress Notes (Addendum)
Inpatient Diabetes Program Recommendations  AACE/ADA: New Consensus Statement on Inpatient Glycemic Control (2015)  Target Ranges:  Prepandial:   less than 140 mg/dL      Peak postprandial:   less than 180 mg/dL (1-2 hours)      Critically ill patients:  140 - 180 mg/dL   Lab Results  Component Value Date   GLUCAP 246 (H) 05/27/2018    Review of Glycemic Control Results for THURLEY, FRANCESCONI (MRN [id number]) as of 05/28/2018 09:22  Ref. Range 05/27/2018 21:55  Glucose-Capillary Latest Ref Range: 70 - 99 mg/dL 246 (H)   Diabetes history: Type 2 DM Outpatient Diabetes medications: none Current orders for Inpatient glycemic control: none Solumedrol 80 mg Q8H  Inpatient Diabetes Program Recommendations:    In the setting of steroids and prior history of DM, please consider adding A1C as no recent noted.  Patient also needs frequent CBG monitoring, last serum was 350 mg/dL. Consider adding Novolog 0-9 units TID and Novolog 0-5 units QHS. Anticipate further adjustments may be needed.   Will place consult for case management, as patient needs outpatient PCP follow up for DM. Noted per last case mgt note on 10/7, patient does have insurance and she may be able to receive this care at [facility]. Just want to ensure patient is followed. Will continue to follow.   Thanks, Bronson Curb, MSN, RNC-OB Diabetes Coordinator [phone] (8a-5p)

## 2018-05-28 NOTE — Care Management Note (Addendum)
Case Management Note  Patient Details  Name: Alexandra Price MRN: [id number] Date of Birth: April 26, 1946  Subjective/Objective:   Pt from home, lives with son and ind with ADL's. Acct notes that patient has Medicare part A.   Action/Plan: Appt made with [facility] to establish primary care. Will add to AVS. Patient is on the cancellation list and will receive a phone should a sooner appt become available.   ADDENDUM: 05/29/2018 Patient noted to be seen by Financial counselor and patient also has Medicaid.   Expected Discharge Date:    05/28/2017              Expected Discharge Plan:  Home/Self Care  In-House Referral:     Discharge planning Services  CM Consult, Follow-up appt scheduled  Post Acute Care Choice:    Choice offered to:     DME Arranged:    DME Agency:     HH Arranged:    HH Agency:     Status of Service:  Completed, signed off  If discussed at [employer] of Stay Meetings, dates discussed:    Additional Comments:  Alexandra Price, Alexandra Reading, RN 05/28/2018, 1:43 PM

## 2018-05-29 ENCOUNTER — Ambulatory Visit (HOSPITAL_COMMUNITY): Payer: Self-pay

## 2018-05-29 ENCOUNTER — Other Ambulatory Visit (HOSPITAL_COMMUNITY): Payer: Self-pay

## 2018-05-29 DIAGNOSIS — F418 Other specified anxiety disorders: Secondary | ICD-10-CM | POA: Diagnosis present

## 2018-05-29 DIAGNOSIS — F329 Major depressive disorder, single episode, unspecified: Secondary | ICD-10-CM

## 2018-05-29 DIAGNOSIS — T7840XA Allergy, unspecified, initial encounter: Secondary | ICD-10-CM | POA: Diagnosis not present

## 2018-05-29 DIAGNOSIS — I10 Essential (primary) hypertension: Secondary | ICD-10-CM | POA: Diagnosis not present

## 2018-05-29 DIAGNOSIS — N184 Chronic kidney disease, stage 4 (severe): Secondary | ICD-10-CM | POA: Diagnosis not present

## 2018-05-29 DIAGNOSIS — F32A Depression, unspecified: Secondary | ICD-10-CM | POA: Diagnosis present

## 2018-05-29 LAB — GLUCOSE, CAPILLARY
Glucose-Capillary: 161 mg/dL — ABNORMAL HIGH (ref 70–99)
Glucose-Capillary: 258 mg/dL — ABNORMAL HIGH (ref 70–99)

## 2018-05-29 LAB — CULTURE, BLOOD (ROUTINE X 2)
Culture: NO GROWTH
Culture: NO GROWTH
SPECIAL REQUESTS: ADEQUATE
Special Requests: ADEQUATE

## 2018-05-29 LAB — URINE CULTURE: Culture: NO GROWTH

## 2018-05-29 MED ORDER — HYDROXYZINE HCL 10 MG PO TABS: 25.0000 mg | ORAL_TABLET | Freq: Three times a day (TID) | ORAL | 0 refills | Status: DC | PRN

## 2018-05-29 MED ORDER — METFORMIN HCL 500 MG PO TABS: 500.0000 mg | ORAL_TABLET | Freq: Two times a day (BID) | ORAL | 11 refills | Status: DC

## 2018-05-29 MED ORDER — HYDROCORTISONE 1 % EX CREA: TOPICAL_CREAM | Freq: Two times a day (BID) | CUTANEOUS | 0 refills | Status: DC

## 2018-05-29 MED ORDER — CITALOPRAM HYDROBROMIDE 20 MG PO TABS: 20.0000 mg | ORAL_TABLET | Freq: Every day | ORAL | 0 refills | Status: DC

## 2018-05-29 MED ORDER — CITALOPRAM HYDROBROMIDE 20 MG PO TABS
20.0000 mg | ORAL_TABLET | Freq: Every day | ORAL | Status: DC
  Administered 2018-05-29: 20 mg via ORAL
  Filled 2018-05-29: qty 1

## 2018-05-29 MED ORDER — PREDNISONE 50 MG PO TABS: 50.0000 mg | ORAL_TABLET | Freq: Every day | ORAL | 0 refills | Status: DC

## 2018-05-29 NOTE — Progress Notes (Signed)
IV removed and discharge instructions reviewed.  To pick up meds from home pharmacy.  Taken down by wc and husband to drive home

## 2018-05-29 NOTE — Evaluation (Signed)
Physical Therapy Evaluation Patient Details Name: Alexandra Price MRN: [id number] DOB: January 31, 1946 Today's Date: 05/29/2018  History of Present Illness  72yo female who w s admitted to [facility] on 10/5 c SOB, and now is admitted for rash from meds. PMH: multiple myeloma currently getting chemotherapy, chronic macrocytic anemia, recent appendectomy, and CKD3.   Clinical Impression  Pt was seen for evaluation of mobility and noted mainly that she is hurting on skin irritation but is not affecting gait.  Follow up not needed as she is at baseline for gait and has her son at home to assist her as needed if she has issues.  DC therapy after this to await dc home.    Follow Up Recommendations No PT follow up    Equipment Recommendations  None recommended by PT    Recommendations for Other Services       Precautions / Restrictions Precautions Precautions: None Restrictions Weight Bearing Restrictions: No      Mobility  Bed Mobility Overal bed mobility: Independent                Transfers Overall transfer level: Independent Equipment used: None                Ambulation/Gait Ambulation/Gait assistance: Modified independent (Device/Increase time) Gait Distance (Feet): 200 Feet Assistive device: IV Pole(holding but not reliant) Gait Pattern/deviations: Step-through pattern;Narrow base of support Gait velocity: WFL   General Gait Details: slow pace but controlling her energy expenditure  Stairs            Wheelchair Mobility    Modified Rankin (Stroke Patients Only)       Balance Overall balance assessment: Mild deficits observed, not formally tested                                           Pertinent Vitals/Pain Pain Assessment: Faces Faces Pain Scale: Hurts even more Pain Location: skin on back Pain Descriptors / Indicators: Burning Pain Intervention(s): Other (comment)(pt is not hurting related to movement and )    Home Living  Family/patient expects to be discharged to:: Private residence Living Arrangements: Children Available Help at Discharge: Family Type of Home: Mobile home Home Access: Ramped entrance     [place]: One [place]: Environmental consultant - 2 wheels;Cane - single point;Shower seat - built in      Prior Function Level of Independence: Independent         Comments: has not used an AD     Hand Dominance   Dominant Hand: Right    Extremity/Trunk Assessment   Upper Extremity Assessment Upper Extremity Assessment: Overall WFL for tasks assessed    Lower Extremity Assessment Lower Extremity Assessment: Overall WFL for tasks assessed    Cervical / Trunk Assessment Cervical / Trunk Assessment: Normal  Communication   Communication: No difficulties  Cognition Arousal/Alertness: Awake/alert Behavior During Therapy: WFL for tasks assessed/performed Overall Cognitive Status: Within Functional Limits for tasks assessed                                        General Comments General comments (skin integrity, edema, etc.): Able to walk with no AD but is holding IV pole    Exercises     Assessment/Plan    PT Assessment Patent  does not need any further PT services  PT Problem List         PT Treatment Interventions      PT Goals (Current goals can be found in the Care Plan section)  Acute Rehab PT Goals Patient Stated Goal: return home with family to assist PT Goal Formulation: All assessment and education complete, DC therapy Potential to Achieve Goals: Good    Frequency     Barriers to discharge        Co-evaluation               AM-PAC PT "6 Clicks" Daily Activity  Outcome Measure Difficulty turning over in bed (including adjusting bedclothes, sheets and blankets)?: None Difficulty moving from lying on back to sitting on the side of the bed? : None Difficulty sitting down on and standing up from a chair with arms (e.g., wheelchair,  bedside commode, etc,.)?: None Help needed moving to and from a bed to chair (including a wheelchair)?: None Help needed walking in hospital room?: None Help needed climbing 3-5 steps with a railing? : A Lot 6 Click Score: 22    End of Session Equipment Utilized During Treatment: Gait belt Activity Tolerance: Patient tolerated treatment well Patient left: in bed;with call bell/phone within reach Nurse Communication: Mobility status PT Visit Diagnosis: Other abnormalities of gait and mobility (R26.89)    Time: 8307-4600 PT Time Calculation (min) (ACUTE ONLY): 19 min   Charges:   PT Evaluation $PT Eval Moderate Complexity: 1 Mod         Ramond Dial 05/29/2018, 9:11 PM   9:13 PM, 05/29/18 Mee Hives, PT, MS Physical Therapist - [place] [phone] (Office)

## 2018-05-29 NOTE — Discharge Summary (Signed)
Physician Discharge Summary  Alexandra Price ACZ:[id number] DOB: 03-14-1946 DOA: 05/27/2018  PCP: [facility] date: 05/27/2018 Discharge date: 05/29/2018  Admitted From: Home Disposition: Home  Recommendations for Outpatient Follow-up:  1. Follow up with PCP in 1-2 weeks to follow-up rash 2. Follow-up with general surgery as previously scheduled post appendectomy. 3. Follow-up with hematology/oncology as previously scheduled  Discharge Condition: Stable CODE STATUS: Full code Diet recommendation: Heart healthy, carb modified  Brief/Interim Summary: 72 year old female with a history of multiple myeloma, chronic kidney disease stage IV, admitted to the hospital with maculopapular rash over her trunk and lower extremities.  She was recently taking antibiotics, ciprofloxacin and doxycycline.  It is felt that she likely has a drug rash.  Discharge Diagnoses:  Active Problems:   Multiple myeloma without remission (HCC)   CKD (chronic kidney disease) stage 4, GFR 15-29 ml/min (HCC)   Rash   Hyperglycemia   HTN (hypertension)   Anxiety   Depression  1. Maculopapular rash.  This is felt to be related to a drug rash from recent antibiotics.  She had been taking ciprofloxacin and doxycycline more recently.  These medications were discontinued.  She was seen by hematology/oncology due to concerns for Revlimid related rash.  It was felt that her rashes not related to Revlimid or recent blood transfusions.  Oncology also thought that she likely has a drug rash.  Steroids are recommended.  She is been placed on 5 days of prednisone.  Her rash is beginning to improve, although still present.  She has been set up with her primary care physician to follow-up on rash. 2. Chronic kidney disease stage IV.  Creatinine is currently at baseline.  Continue to monitor. 3. Hyperglycemia.  This is related to steroids.  Patient does admit that she has prediabetes.  She is been  started on low-dose metformin which would also be beneficial for her prediabetes. 4. Hypertension.  Currently on lisinopril.  Blood pressure stable. 5. Anxiety.  Xanax has been discontinued.  She was started on Vistaril. 6. Myeloma.  She will follow-up with oncology for further management. 7. Depression.  Patient does describe significant clinical depression.  She is very tearful, has difficulty sleeping and admits to feeling depressed most of the time.  She reports already having an appointment set up with an outpatient mental health specialist on 11/4.  She has been started on citalopram in the interim.  Discharge Instructions  Discharge Instructions    Diet - low sodium heart healthy   Complete by:  As directed    Increase activity slowly   Complete by:  As directed      Allergies as of 05/29/2018      Reactions   Morphine And Related Nausea And Vomiting      Medication List    STOP taking these medications   ALPRAZolam 0.25 MG tablet Commonly known as:  XANAX   ciprofloxacin 250 MG tablet Commonly known as:  CIPRO     TAKE these medications   acyclovir 400 MG tablet Commonly known as:  ZOVIRAX Take 1 tablet (400 mg total) by mouth 2 (two) times daily.   allopurinol 300 MG tablet Commonly known as:  ZYLOPRIM Take 1 tablet (300 mg total) by mouth daily.   aspirin EC 81 MG tablet Take 81 mg by mouth daily.   CALCIUM 1000 + D PO Take 1,000 mg by mouth daily. Take one tablet daily until next visit with oncologist.   citalopram  20 MG tablet Commonly known as:  CELEXA Take 1 tablet (20 mg total) by mouth daily. Start taking on:  05/30/2018   feeding supplement (ENSURE ENLIVE) Liqd Take 237 mLs by mouth 2 (two) times daily between meals for 14 days.   HYDROcodone-acetaminophen 5-325 MG tablet Commonly known as:  NORCO/VICODIN Take 1 tablet by mouth 2 (two) times daily.   hydrocortisone cream 1 % Apply topically 2 (two) times daily.   hydrOXYzine 10 MG  tablet Commonly known as:  ATARAX/VISTARIL Take 2.5 tablets (25 mg total) by mouth 3 (three) times daily as needed for itching or anxiety.   lidocaine-prilocaine cream Commonly known as:  EMLA Apply to affected area once   lisinopril 10 MG tablet Commonly known as:  PRINIVIL,ZESTRIL Take 1 tablet (10 mg total) by mouth daily.   metFORMIN 500 MG tablet Commonly known as:  GLUCOPHAGE Take 1 tablet (500 mg total) by mouth 2 (two) times daily with a meal.   ondansetron 4 MG tablet Commonly known as:  ZOFRAN Take 1 tablet (4 mg total) by mouth every 8 (eight) hours as needed for nausea or vomiting.   pantoprazole 40 MG tablet Commonly known as:  PROTONIX Take 1 tablet (40 mg total) by mouth daily.   polyethylene glycol packet Commonly known as:  MIRALAX / GLYCOLAX Take 17 g by mouth daily.   predniSONE 50 MG tablet Commonly known as:  DELTASONE Take 1 tablet (50 mg total) by mouth daily with breakfast. Start taking on:  05/30/2018   prochlorperazine 10 MG tablet Commonly known as:  COMPAZINE Take 1 tablet (10 mg total) by mouth every 6 (six) hours as needed (Nausea or vomiting).   trolamine salicylate 10 % cream Commonly known as:  ASPERCREME Apply 1 application topically daily as needed for muscle pain.      Follow-up Information    [facility].   Why:  appt November 13th at 11:15 to establish care with a primary doctor, arrive at 10:45 to fill out paperwork prior to appointment; you are on the list, that if they have a sooner appt time, they will call you Contact information: [address] [place] [address] [phone]          Allergies  Allergen Reactions  . Morphine And Related Nausea And Vomiting    Consultations:  Hematology/oncology   Procedures/Studies: Ct Abdomen Pelvis Wo Contrast  Result Date: 05/20/2018 CLINICAL DATA:  Right lower quadrant pain for 4 days, constipation, some nausea, history of multiple  myeloma EXAM: CT ABDOMEN AND PELVIS WITHOUT CONTRAST TECHNIQUE: Multidetector CT imaging of the abdomen and pelvis was performed following the standard protocol without IV contrast. COMPARISON:  CT abdomen pelvis of 05/11/2017 FINDINGS: Lower chest: Linear scarring or atelectasis is again noted medially at the left lung base. No pneumonia or pleural effusion is seen. Heart size is stable. Hepatobiliary: No focal liver abnormality is seen. No gallstones, gallbladder wall thickening, or biliary dilatation. The liver is unremarkable in the unenhanced state. No calcified gallstones are seen. Pancreas: The pancreas is normal in size and the pancreatic duct is not dilated. Spleen: The spleen is unremarkable. There is a small splenic artery calcification near the splenic hilus. Adrenals/Urinary Tract: The adrenal glands appear normal. No renal calculi are seen and there is no evidence of hydronephrosis. The ureters are normal in caliber. The urinary bladder is not well distended but no abnormality is seen. Stomach/Bowel: The stomach is largely decompressed. No small bowel abnormality  is seen. No abnormality of the colon is seen other than possible edema at the base of the cecum. The appendix appears thickened and fluid filled extending caudally and anteriorly toward the right inguinal canal region. The appendix measures up to 13 mm in diameter. There is some strandiness of the surrounding soft tissue planes but no evidence of rupture or abscess formation is seen. Vascular/Lymphatic: Abdominal aorta is normal in caliber with mild abdominal aortic atherosclerosis for age. No adenopathy is seen with only small retroperitoneal nodes and mesenteric nodes present. Reproductive: The uterus has previously been resected. No adnexal lesion is seen. A tiny amount of fluid layers in the pelvis. Other: No abdominal wall hernia is seen Musculoskeletal: Diffuse bone lesions are present in this patient with known multiple myeloma with  partial compression of anterior superior aspect of L4 and T11 again noted. IMPRESSION: 1. Dilated edematous and somewhat rigid appendix measuring up to 13 mm in diameter with some surrounding strandiness of the fat planes indicative of acute appendicitis. No complicating features are seen. 2. Diffuse bone lesions consistent with known multiple myeloma. Electronically Signed   By: Ivar Drape M.D.   On: 05/20/2018 13:17   Ct Abdomen Pelvis Wo Contrast  Result Date: 05/11/2018 CLINICAL DATA:  72 year old female with abdominal pain. History of multiple myeloma. EXAM: CT ABDOMEN AND PELVIS WITHOUT CONTRAST TECHNIQUE: Multidetector CT imaging of the abdomen and pelvis was performed following the standard protocol without IV contrast. COMPARISON:  Skeletal survey radiograph dated 04/07/2018 FINDINGS: Evaluation of this exam is limited in the absence of intravenous contrast. Lower chest: Left lung base linear atelectasis/scarring. The visualized lung bases are otherwise clear. There is hypoattenuation of the cardiac blood pool suggestive of a degree of anemia. Clinical correlation is recommended. The tip of Port-A-Cath is partially visualized in the region of the cavoatrial junction. No intra-abdominal free air or free fluid. Hepatobiliary: No focal liver abnormality is seen. No gallstones, gallbladder wall thickening, or biliary dilatation. Pancreas: Punctate focus of calcification in the body of the pancreas may be related to vascular calcification or sequela of prior inflammation. The pancreas is otherwise unremarkable. Spleen: Normal in size without focal abnormality. Adrenals/Urinary Tract: Adrenal glands are unremarkable. Kidneys are normal, without renal calculi, focal lesion, or hydronephrosis. Bladder is unremarkable. Stomach/Bowel: There is loose stool within the colon compatible with diarrheal state. There is no bowel obstruction or active inflammation. Normal appendix. Vascular/Lymphatic: Mild aortoiliac  atherosclerotic disease. The abdominal aorta and IVC are otherwise grossly unremarkable on this noncontrast CT. No portal venous gas. There is no adenopathy. Reproductive: Hysterectomy.  No pelvic mass. Other: None Musculoskeletal: Osteopenia. Innumerable lytic osseous lesions consistent with known multiple myeloma. There is compression fracture of the superior endplate of L4 with approximately 40% loss of vertebral body height centrally similar or progressed since the prior radiograph. There is compression fracture of the T11 with approximately 50% loss of vertebral body height and mild anterior wedging similar to prior radiograph. There are multiple old appearing left posterior rib fractures. No definite acute fracture. Evaluation for acute fractures however limited due to advanced osteopenia and extensive lytic lucencies. IMPRESSION: 1. Diarrheal state. Correlation with clinical exam and stool cultures recommended. No bowel obstruction. Normal appendix. 2. Innumerable osseous lytic lesions in keeping with known multiple myeloma. Known T11 and L4 compression fractures as well as multiple old-appearing left posterior rib fractures. No definite acute fracture. Electronically Signed   By: Anner Crete M.D.   On: 05/11/2018 23:00   Dg Chest  1 View  Result Date: 05/22/2018 CLINICAL DATA:  Abdominal pain and fever in a patient with a history of multiple myeloma. EXAM: CHEST  1 VIEW COMPARISON:  PA and lateral chest 05/20/2018 and 05/11/2018. FINDINGS: Lung volumes are low but the lungs are clear. Heart size is normal. No pneumothorax or pleural effusion. Port-A-Cath is in place. Multiple lytic bony lesions consistent with multiple myeloma noted. IMPRESSION: No acute disease. Lytic bone lesions consistent with multiple myeloma. Electronically Signed   By: Inge Rise M.D.   On: 05/22/2018 11:17   Dg Chest 2 View  Result Date: 05/27/2018 CLINICAL DATA:  Multiple myeloma.  Dysphagia. EXAM: CHEST - 2 VIEW  COMPARISON:  May 26, 2018 FINDINGS: Port-A-Cath tip is in the superior vena cava. No pneumothorax. No edema or consolidation. Heart size and pulmonary vascularity are normal. No adenopathy. There is an expansile lesion along the superior, medial left scapula. There is widespread lytic bony abnormality consistent with known multiple myeloma. IMPRESSION: Extensive bony changes of multiple myeloma. No edema or consolidation. No evident adenopathy. Electronically Signed   By: Lowella Grip III M.D.   On: 05/27/2018 15:40   Dg Chest 2 View  Result Date: 05/24/2018 CLINICAL DATA:  Shortness of breath, left chest pain. History of myeloma. EXAM: CHEST - 2 VIEW COMPARISON:  05/23/2018 FINDINGS: Right Port-A-Cath in place with the tip in the SVC, unchanged. Heart is normal size. No confluent airspace opacities or effusions. Diffuse lytic lesions throughout the bony skeleton compatible with myeloma. Multiple healing bilateral rib fractures. IMPRESSION: No acute cardiopulmonary disease. Diffuse lytic lesions throughout the bones compatible with myeloma with healing bilateral rib fractures Electronically Signed   By: Rolm Baptise M.D.   On: 05/24/2018 14:39   Dg Chest 2 View  Result Date: 05/20/2018 CLINICAL DATA:  Cough, dizziness, and chest pain. History of multiple myeloma. Nonsmoker. EXAM: CHEST - 2 VIEW COMPARISON:  PA and lateral chest x-ray of May 11, 2018 FINDINGS: The lungs are adequately inflated. The interstitial markings are coarse though stable. Confluent density projecting over the posterior aspect of the right seventh rib is consistent with a myomatous rib lesion and is stable. The heart and pulmonary vascularity are normal. There is faint calcification in the wall of the aortic arch. The power port catheter tip projects over the midportion of the SVC. There is chronic partial compression of the body of T11 or T12. There are innumerable permeative lesions within the humeri, clavicles, and  scapulae. There is deformity of the lateral aspect of the right fourth rib consistent with a previous fracture. IMPRESSION: Chronic bronchitic changes. No alveolar pneumonia nor pulmonary edema. Extensive myomatous involvement of the visualized bony structures. Thoracic aortic atherosclerosis. Electronically Signed   By: David  Martinique M.D.   On: 05/20/2018 11:36   Dg Chest 2 View  Result Date: 05/11/2018 CLINICAL DATA:  Abdominal pain EXAM: CHEST - 2 VIEW COMPARISON:  04/27/2018 FINDINGS: Right Port-A-Cath remains in place, unchanged. Heart is normal size. No confluent airspace opacities or effusions. Numerous lucencies throughout the visualized skeletal structures compatible with diffuse widespread myeloma. IMPRESSION: No acute cardiopulmonary disease. Electronically Signed   By: Rolm Baptise M.D.   On: 05/11/2018 22:57   Nm Pulmonary Perf And Vent  Result Date: 05/23/2018 CLINICAL DATA:  Three days post appendectomy, chest pain, high pretest clinical probability for pulmonary embolism EXAM: NUCLEAR MEDICINE VENTILATION - PERFUSION LUNG SCAN TECHNIQUE: Ventilation images were obtained in multiple projections using inhaled aerosol [id number]. Perfusion images were obtained in  multiple projections after intravenous injection of Tc-8mMAA. RADIOPHARMACEUTICALS:  32 mCi of [id number] aerosol inhalation and 4.4 mCi [id number] IV COMPARISON:  None Correlation: Chest radiograph 05/23/2018 FINDINGS: Ventilation: Normal Perfusion: Diminished perfusion at the posterior lung bases bilaterally. Observed pattern suggests presence of small bibasilar pleural effusions or minimal atelectasis. Portable chest radiograph demonstrates subsegmental atelectasis at LEFT base but no definite pleural effusions. No other perfusion defects identified. IMPRESSION: Low probability for pulmonary embolism. Electronically Signed   By: MarLavonia DanaD.   On: 05/23/2018 12:26   Us Koreanous Img Lower Bilateral  Result Date:  05/23/2018 CLINICAL DATA:  Bilateral lower extremity pain and edema. Elevated D-dimer. History of multiple myeloma. Evaluate for DVT EXAM: BILATERAL LOWER EXTREMITY VENOUS DOPPLER ULTRASOUND TECHNIQUE: Gray-scale sonography with graded compression, as well as color Doppler and duplex ultrasound were performed to evaluate the lower extremity deep venous systems from the level of the common femoral vein and including the common femoral, femoral, profunda femoral, popliteal and calf veins including the posterior tibial, peroneal and gastrocnemius veins when visible. The superficial great saphenous vein was also interrogated. Spectral Doppler was utilized to evaluate flow at rest and with distal augmentation maneuvers in the common femoral, femoral and popliteal veins. COMPARISON:  None. FINDINGS: RIGHT LOWER EXTREMITY Common Femoral Vein: No evidence of thrombus. Normal compressibility, respiratory phasicity and response to augmentation. Saphenofemoral Junction: No evidence of thrombus. Normal compressibility and flow on color Doppler imaging. Profunda Femoral Vein: No evidence of thrombus. Normal compressibility and flow on color Doppler imaging. Femoral Vein: No evidence of thrombus. Normal compressibility, respiratory phasicity and response to augmentation. Popliteal Vein: No evidence of thrombus. Normal compressibility, respiratory phasicity and response to augmentation. Calf Veins: No evidence of thrombus. Normal compressibility and flow on color Doppler imaging. Superficial Great Saphenous Vein: No evidence of thrombus. Normal compressibility. Venous Reflux:  None. Other Findings:  None. LEFT LOWER EXTREMITY Common Femoral Vein: No evidence of thrombus. Normal compressibility, respiratory phasicity and response to augmentation. Saphenofemoral Junction: No evidence of thrombus. Normal compressibility and flow on color Doppler imaging. Profunda Femoral Vein: No evidence of thrombus. Normal compressibility and flow  on color Doppler imaging. Femoral Vein: No evidence of thrombus. Normal compressibility, respiratory phasicity and response to augmentation. Popliteal Vein: No evidence of thrombus. Normal compressibility, respiratory phasicity and response to augmentation. Calf Veins: No evidence of thrombus. Normal compressibility and flow on color Doppler imaging. Superficial Great Saphenous Vein: No evidence of thrombus. Normal compressibility. Venous Reflux:  None. Other Findings:  None. IMPRESSION: No evidence of DVT within either lower extremity. Electronically Signed   By: JohSandi MariscalD.   On: 05/23/2018 12:43   Dg Chest Port 1 View  Result Date: 05/23/2018 CLINICAL DATA:  Multiple myeloma.  Unexplained fever. EXAM: PORTABLE CHEST 1 VIEW COMPARISON:  05/22/2018.  05/20/2018.  05/11/2018. FINDINGS: Power port remains in place. Heart size is normal. Chronic interstitial lung markings are again evident. There is mild patchy atelectasis and or pneumonia at the left lung base. The remainder the chest is clear. Multiple lytic skeletal lesions again demonstrated. IMPRESSION: New small area of atelectasis or pneumonia in the left lower lobe. Chronic interstitial lung markings elsewhere. Chronic lytic bone lesions. Electronically Signed   By: MarNelson ChimesD.   On: 05/23/2018 07:37   Dg Abd Acute W/chest  Result Date: 05/26/2018 CLINICAL DATA:  Loose stools.  History of multiple myeloma. EXAM: DG ABDOMEN ACUTE W/ 1V CHEST COMPARISON:  Chest radiograph, 05/24/2018. Abdomen and pelvis CT, 05/20/2018.  FINDINGS: Normal bowel gas pattern. Specifically, no evidence of obstruction, generalized adynamic ileus or free air. No evidence of renal or ureteral stones. Skin staples are noted overlying the central abdomen and lower pelvis. Soft tissues are otherwise unremarkable. Cardiac silhouette is normal in size. No mediastinal or hilar masses. Lungs show prominent bronchovascular and interstitial markings, but no evidence of  pneumonia or pulmonary edema and no change. Right anterior chest wall Port-A-Cath is stable. Skeletal structures are demineralized with numerous well-defined lytic lesions consistent with the given history of multiple myeloma. IMPRESSION: 1. No acute findings. No evidence of bowel obstruction, generalized adynamic ileus or free air. 2. No acute cardiopulmonary disease. Electronically Signed   By: Lajean Manes M.D.   On: 05/26/2018 15:06       Subjective: Complains of itching and the rash.  She feels depressed.  Discharge Exam: Vitals:   05/28/18 2142 05/29/18 0500 05/29/18 0515 05/29/18 1455  BP: 128/76  117/74 116/65  Pulse: 100  87 99  Resp: 18  18 20   Temp: 98.4 F (36.9 C)  98.5 F (36.9 C)   TempSrc: Oral  Oral   SpO2: 98%  97% 98%  Weight:  80.6 kg    Height:        General: Pt is alert, awake, not in acute distress Cardiovascular: RRR, S1/S2 +, no rubs, no gallops Respiratory: CTA bilaterally, no wheezing, no rhonchi Abdominal: Soft, NT, ND, bowel sounds + Extremities: 1+ edema, no cyanosis Skin: Diffuse maculopapular rash over trunk and lower extremities, slowly improving    The results of significant diagnostics from this hospitalization (including imaging, microbiology, ancillary and laboratory) are listed below for reference.     Microbiology: Recent Results (from the past 240 hour(s))  Urine culture     Status: Abnormal   Collection Time: 05/20/18 10:40 AM  Result Value Ref Range Status   Specimen Description   Final    URINE, CLEAN CATCH Performed at [facility], [address] [place], [place] [address]    Special Requests   Final    Normal Performed at [facility], [address] [place],  [address]    Culture >=100,000 COLONIES/mL PSEUDOMONAS AERUGINOSA (A)  Final   Report Status 05/23/2018 FINAL  Final   Organism ID, Bacteria PSEUDOMONAS AERUGINOSA (A)  Final      Susceptibility   Pseudomonas aeruginosa - MIC*    CEFTAZIDIME 4 SENSITIVE  Sensitive     CIPROFLOXACIN <=0.25 SENSITIVE Sensitive     GENTAMICIN <=1 SENSITIVE Sensitive     IMIPENEM 1 SENSITIVE Sensitive     PIP/TAZO 8 SENSITIVE Sensitive     CEFEPIME 2 SENSITIVE Sensitive     * >=100,000 COLONIES/mL PSEUDOMONAS AERUGINOSA  Blood Culture (routine x 2)     Status: None   Collection Time: 05/20/18 10:58 AM  Result Value Ref Range Status   Specimen Description BLOOD LEFT HAND  Final   Special Requests   Final    BOTTLES DRAWN AEROBIC AND ANAEROBIC Blood Culture adequate volume   Culture   Final    NO GROWTH 5 DAYS Performed at [facility], [address] [place],  [address]    Report Status 05/25/2018 FINAL  Final  Blood Culture (routine x 2)     Status: None   Collection Time: 05/20/18 11:06 AM  Result Value Ref Range Status   Specimen Description BLOOD LEFT HAND  Final   Special Requests   Final    BOTTLES DRAWN AEROBIC AND ANAEROBIC  Blood Culture adequate volume   Culture   Final    NO GROWTH 5 DAYS Performed at [facility], [address] [place], [place] [address]    Report Status 05/25/2018 FINAL  Final  MRSA PCR Screening     Status: Abnormal   Collection Time: 05/20/18  5:31 PM  Result Value Ref Range Status   MRSA by PCR POSITIVE (A) NEGATIVE Final    Comment:        The GeneXpert MRSA Assay (FDA approved for NASAL specimens only), is one component of a comprehensive MRSA colonization surveillance program. It is not intended to diagnose MRSA infection nor to guide or monitor treatment for MRSA infections. RESULT CALLED TO, READ BACK BY AND VERIFIED WITH: AMBURN,A AT 0653 BY HUFFINES,S ON 05/21/18. Performed at [facility], [address], [place], [place] [address]   Culture, blood (Routine x 2)     Status: None   Collection Time: 05/22/18 10:12 AM  Result Value Ref Range Status   Specimen Description BLOOD LEFT HAND DRAWN BY RN  Final   Special Requests   Final    BOTTLES DRAWN AEROBIC AND ANAEROBIC Blood Culture results  may not be optimal due to an excessive volume of blood received in culture bottles   Culture   Final    NO GROWTH 5 DAYS Performed at [facility], [address], [place], [place] [address]    Report Status 05/27/2018 FINAL  Final  Culture, blood (Routine x 2)     Status: None   Collection Time: 05/22/18 10:12 AM  Result Value Ref Range Status   Specimen Description BLOOD RIGHT HAND  Final   Special Requests   Final    BOTTLES DRAWN AEROBIC AND ANAEROBIC Blood Culture adequate volume   Culture   Final    NO GROWTH 5 DAYS Performed at [facility], [address], [place], [place] [address]    Report Status 05/27/2018 FINAL  Final  Urine culture     Status: None   Collection Time: 05/22/18 12:28 PM  Result Value Ref Range Status   Specimen Description   Final    URINE, CATHETERIZED Performed at [facility], [address] [place], [place] [address]    Special Requests   Final    Normal Performed at [facility], [address] [place], [place] [address]    Culture   Final    NO GROWTH Performed at [facility], [place] [address] [place], [place] [address]    Report Status 05/24/2018 FINAL  Final  Blood Culture (routine x 2)     Status: None   Collection Time: 05/24/18  2:21 PM  Result Value Ref Range Status   Specimen Description BLOOD RIGHT ARM DRAWN BY RN  Final   Special Requests   Final    BOTTLES DRAWN AEROBIC AND ANAEROBIC Blood Culture adequate volume   Culture   Final    NO GROWTH 5 DAYS Performed at [facility], [address] [place], [place] [address]    Report Status 05/29/2018 FINAL  Final  Blood Culture (routine x 2)     Status: None   Collection Time: 05/24/18  3:00 PM  Result Value Ref Range Status   Specimen Description BLOOD LEFT HAND  Final   Special Requests   Final    BOTTLES DRAWN AEROBIC AND ANAEROBIC Blood Culture adequate volume   Culture   Final    NO GROWTH 5 DAYS Performed at [facility],  [address] [place], [place] [address]     Report Status 05/29/2018 FINAL  Final  Urine culture     Status: None   Collection Time: 05/24/18  8:01 PM  Result Value Ref Range Status   Specimen Description   Final    URINE, CLEAN CATCH Performed at [facility], [address], [place], [place] [address]    Special Requests   Final    NONE Performed at [facility], [address], [place], [place] [address]    Culture   Final    NO GROWTH Performed at [facility], [place] [address] [place], [place] [address]    Report Status 05/26/2018 FINAL  Final  C difficile quick scan w PCR reflex     Status: None   Collection Time: 05/25/18  8:00 AM  Result Value Ref Range Status   C Diff antigen NEGATIVE NEGATIVE Final   C Diff toxin NEGATIVE NEGATIVE Final   C Diff interpretation No C. difficile detected.  Final    Comment: Performed at [facility], [address], [place], [place] [address]  Culture, blood (routine x 2)     Status: None (Preliminary result)   Collection Time: 05/27/18  2:57 PM  Result Value Ref Range Status   Specimen Description BLOOD RIGHT ANTECUBITAL  Final   Special Requests   Final    BOTTLES DRAWN AEROBIC ONLY Blood Culture adequate volume   Culture   Final    NO GROWTH 2 DAYS Performed at [facility], [address] [place], [place] [address]    Report Status PENDING  Incomplete  Culture, blood (routine x 2)     Status: None (Preliminary result)   Collection Time: 05/27/18  2:59 PM  Result Value Ref Range Status   Specimen Description BLOOD BLOOD RIGHT WRIST  Final   Special Requests   Final    BOTTLES DRAWN AEROBIC AND ANAEROBIC Blood Culture adequate volume   Culture   Final    NO GROWTH 2 DAYS Performed at [facility], [address] [place], [place] [address]    Report Status PENDING  Incomplete  Urine culture     Status: None   Collection Time: 05/27/18  4:45 PM  Result Value Ref Range Status   Specimen Description   Final    URINE, CLEAN CATCH Performed at [facility], [address], [place], [place] [address]    Special Requests   Final    NONE Performed at [facility], [address] [place], [place] [address]    Culture   Final    NO GROWTH Performed at [facility], [place] [address], [place], [place] [address]    Report Status 05/29/2018 FINAL  Final     Labs: BNP (last 3 results) No results for input(s): BNP in the last 8760 hours. Basic Metabolic Panel: Recent Labs  Lab 05/24/18 1421 05/25/18 0343 05/26/18 0518 05/27/18 1457 05/28/18 0249  NA 134* 135 134* 134* 135  K 3.1* 4.0 4.4 3.9 5.0  CL 104 109 105 103 107  CO2 21* 22 23 23 22   GLUCOSE 236* 129* 172* 207* 350*  BUN 22 18 16 17 19   CREATININE 2.08* 1.95* 1.72* 1.83* 1.84*  CALCIUM 7.0* 6.6* 6.6* 7.5* 6.9*  MG 1.4* 1.7 1.4* 1.7  --    Liver Function Tests: Recent Labs  Lab 05/23/18 0511 05/24/18 1421 05/26/18 0518 05/28/18 0249  AST 25 23 25 16   ALT 48* 46* 47*  31  ALKPHOS 128* 125 169* 221*  BILITOT 0.6 0.7 0.6 0.5  PROT 6.3* 5.9* 5.7* 5.4*  ALBUMIN 2.5* 2.4* 2.4* 2.4*   No results for input(s): LIPASE, AMYLASE in the last 168 hours. No results for input(s): AMMONIA in the last 168 hours. CBC: Recent Labs  Lab 05/23/18 0511 05/24/18 1421 05/25/18 0343 05/25/18 2115 05/26/18 0518 05/27/18 1457 05/28/18 0249  WBC 13.6* 11.8* 12.2*  --  14.3* 14.3* 10.7*  NEUTROABS 11.1* 8.2*  --   --  8.9*  --   --   HGB 8.6* 7.8* 7.3* 9.3* 9.6* 11.0* 9.3*  HCT 26.7* 23.7* 21.6* 27.2* 29.0* 34.3* 29.3*  MCV 94.7 92.6 93.1  --  91.2 92.5 93.3  PLT 125* 111* 131*  --  150 214 179   Cardiac Enzymes: Recent Labs  Lab 05/23/18 0511  05/24/18 2031 05/25/18 0342 05/27/18 2108 05/28/18 0249 05/28/18 0915  CKTOTAL 14*  --   --   --   --   --   --   TROPONINI  --    < > <0.03 <0.03 <0.03 <0.03 <0.03   < > = values in this interval not displayed.   BNP: Invalid input(s): POCBNP CBG: Recent Labs  Lab 05/28/18 1302 05/28/18 1616 05/28/18 2142 05/29/18 0729 05/29/18 1124   GLUCAP 375* 187* 284* 161* 258*   D-Dimer No results for input(s): DDIMER in the last 72 hours. Hgb A1c No results for input(s): HGBA1C in the last 72 hours. Lipid Profile No results for input(s): CHOL, HDL, LDLCALC, TRIG, CHOLHDL, LDLDIRECT in the last 72 hours. Thyroid function studies Recent Labs    05/28/18 0249  TSH 0.257*   Anemia work up No results for input(s): VITAMINB12, FOLATE, FERRITIN, TIBC, IRON, RETICCTPCT in the last 72 hours. Urinalysis    Component Value Date/Time   COLORURINE YELLOW 05/27/2018 [place] 05/27/2018 1645   LABSPEC 1.006 05/27/2018 1645   PHURINE 5.0 05/27/2018 1645   GLUCOSEU >=500 (A) 05/27/2018 1645   HGBUR NEGATIVE 05/27/2018 1645   BILIRUBINUR NEGATIVE 05/27/2018 1645   KETONESUR NEGATIVE 05/27/2018 1645   PROTEINUR NEGATIVE 05/27/2018 1645   NITRITE NEGATIVE 05/27/2018 1645   LEUKOCYTESUR NEGATIVE 05/27/2018 1645   Sepsis Labs Invalid input(s): PROCALCITONIN,  WBC,  LACTICIDVEN Microbiology Recent Results (from the past 240 hour(s))  Urine culture     Status: Abnormal   Collection Time: 05/20/18 10:40 AM  Result Value Ref Range Status   Specimen Description   Final    URINE, CLEAN CATCH Performed at [facility], [address] [place], [place] [address]    Special Requests   Final    Normal Performed at [facility], [address], [place], [place] [address]    Culture >=100,000 COLONIES/mL PSEUDOMONAS AERUGINOSA (A)  Final   Report Status 05/23/2018 FINAL  Final   Organism ID, Bacteria PSEUDOMONAS AERUGINOSA (A)  Final      Susceptibility   Pseudomonas aeruginosa - MIC*    CEFTAZIDIME 4 SENSITIVE Sensitive     CIPROFLOXACIN <=0.25 SENSITIVE Sensitive     GENTAMICIN <=1 SENSITIVE Sensitive     IMIPENEM 1 SENSITIVE Sensitive     PIP/TAZO 8 SENSITIVE Sensitive     CEFEPIME 2 SENSITIVE Sensitive     * >=100,000 COLONIES/mL PSEUDOMONAS AERUGINOSA  Blood Culture (routine x 2)     Status: None   Collection  Time: 05/20/18 10:58 AM  Result Value Ref Range Status   Specimen Description BLOOD LEFT HAND  Final  Special Requests   Final    BOTTLES DRAWN AEROBIC AND ANAEROBIC Blood Culture adequate volume   Culture   Final    NO GROWTH 5 DAYS Performed at [facility], [address], [place], [place] [address]    Report Status 05/25/2018 FINAL  Final  Blood Culture (routine x 2)     Status: None   Collection Time: 05/20/18 11:06 AM  Result Value Ref Range Status   Specimen Description BLOOD LEFT HAND  Final   Special Requests   Final    BOTTLES DRAWN AEROBIC AND ANAEROBIC Blood Culture adequate volume   Culture   Final    NO GROWTH 5 DAYS Performed at [facility], [address], [place], [place] [address]    Report Status 05/25/2018 FINAL  Final  MRSA PCR Screening     Status: Abnormal   Collection Time: 05/20/18  5:31 PM  Result Value Ref Range Status   MRSA by PCR POSITIVE (A) NEGATIVE Final    Comment:        The GeneXpert MRSA Assay (FDA approved for NASAL specimens only), is one component of a comprehensive MRSA colonization surveillance program. It is not intended to diagnose MRSA infection nor to guide or monitor treatment for MRSA infections. RESULT CALLED TO, READ BACK BY AND VERIFIED WITH: AMBURN,A AT 0653 BY HUFFINES,S ON 05/21/18. Performed at [facility], [address], [place], [place] [address]   Culture, blood (Routine x 2)     Status: None   Collection Time: 05/22/18 10:12 AM  Result Value Ref Range Status   Specimen Description BLOOD LEFT HAND DRAWN BY RN  Final   Special Requests   Final    BOTTLES DRAWN AEROBIC AND ANAEROBIC Blood Culture results may not be optimal due to an excessive volume of blood received in culture bottles   Culture   Final    NO GROWTH 5 DAYS Performed at [facility], [address], [place], [place] [address]    Report Status 05/27/2018 FINAL  Final  Culture, blood (Routine x 2)     Status: None   Collection Time: 05/22/18 10:12  AM  Result Value Ref Range Status   Specimen Description BLOOD RIGHT HAND  Final   Special Requests   Final    BOTTLES DRAWN AEROBIC AND ANAEROBIC Blood Culture adequate volume   Culture   Final    NO GROWTH 5 DAYS Performed at [facility], [address], [place], [place] [address]    Report Status 05/27/2018 FINAL  Final  Urine culture     Status: None   Collection Time: 05/22/18 12:28 PM  Result Value Ref Range Status   Specimen Description   Final    URINE, CATHETERIZED Performed at [facility], [address] [place], [place] [address]    Special Requests   Final    Normal Performed at [facility], [address], [place], [place] [address]    Culture   Final    NO GROWTH Performed at [facility], [place] [address] [place], [place] [address]    Report Status 05/24/2018 FINAL  Final  Blood Culture (routine x 2)     Status: None   Collection Time: 05/24/18  2:21 PM  Result Value Ref Range Status   Specimen Description BLOOD RIGHT ARM DRAWN BY RN  Final   Special Requests   Final    BOTTLES DRAWN AEROBIC AND ANAEROBIC Blood Culture adequate volume   Culture   Final  NO GROWTH 5 DAYS Performed at [facility], [address], [place], [place] [address]    Report Status 05/29/2018 FINAL  Final  Blood Culture (routine x 2)     Status: None   Collection Time: 05/24/18  3:00 PM  Result Value Ref Range Status   Specimen Description BLOOD LEFT HAND  Final   Special Requests   Final    BOTTLES DRAWN AEROBIC AND ANAEROBIC Blood Culture adequate volume   Culture   Final    NO GROWTH 5 DAYS Performed at [facility], [address], [place], [place] [address]    Report Status 05/29/2018 FINAL  Final  Urine culture     Status: None   Collection Time: 05/24/18  8:01 PM  Result Value Ref Range Status   Specimen Description   Final    URINE, CLEAN CATCH Performed at [facility], [address], [place], [place] [address]    Special Requests   Final    NONE Performed  at [facility], [address] [place], [place] [address]    Culture   Final    NO GROWTH Performed at [facility], [place] [address], [place], [place] [address]    Report Status 05/26/2018 FINAL  Final  C difficile quick scan w PCR reflex     Status: None   Collection Time: 05/25/18  8:00 AM  Result Value Ref Range Status   C Diff antigen NEGATIVE NEGATIVE Final   C Diff toxin NEGATIVE NEGATIVE Final   C Diff interpretation No C. difficile detected.  Final    Comment: Performed at [facility], [address], [place], [place] [address]  Culture, blood (routine x 2)     Status: None (Preliminary result)   Collection Time: 05/27/18  2:57 PM  Result Value Ref Range Status   Specimen Description BLOOD RIGHT ANTECUBITAL  Final   Special Requests   Final    BOTTLES DRAWN AEROBIC ONLY Blood Culture adequate volume   Culture   Final    NO GROWTH 2 DAYS Performed at [facility], [address] [place], [place] [address]    Report Status PENDING  Incomplete  Culture, blood (routine x 2)     Status: None (Preliminary result)   Collection Time: 05/27/18  2:59 PM  Result Value Ref Range Status   Specimen Description BLOOD BLOOD RIGHT WRIST  Final   Special Requests   Final    BOTTLES DRAWN AEROBIC AND ANAEROBIC Blood Culture adequate volume   Culture   Final    NO GROWTH 2 DAYS Performed at [facility], [address] [place], [place] [address]    Report Status PENDING  Incomplete  Urine culture     Status: None   Collection Time: 05/27/18  4:45 PM  Result Value Ref Range Status   Specimen Description   Final    URINE, CLEAN CATCH Performed at [facility], [address], [place], [place] [address]    Special Requests   Final    NONE Performed at [facility], [address] [place] [address], [place], [place] [address]    Culture   Final    NO GROWTH Performed at [facility], [place] [address] [place], [place] [address]    Report Status 05/29/2018 FINAL  Final     Time  coordinating discharge: 73mns  SIGNED:   JKathie Dike MD  [facility] 05/29/2018, 6:26 PM Pager   If 7PM-7AM, please contact night-coverage [email or web address] Password [email or web address]

## 2018-05-30 ENCOUNTER — Telehealth (HOSPITAL_COMMUNITY): Payer: Self-pay

## 2018-05-30 ENCOUNTER — Ambulatory Visit (HOSPITAL_COMMUNITY): Payer: Self-pay

## 2018-05-30 NOTE — Telephone Encounter (Signed)
Patient was discharged from the hospital and was instructed not to take her Xanax.  The son called asking if she could take the medication or another medicine due to his mom telling him she has "uncontrollable shaking".  Instructed the son the oncologist or NP would be sent a telephone communication for further instructions.  Verbalized understanding.   Telephone no. [phone]

## 2018-05-30 NOTE — Telephone Encounter (Signed)
-----   Message from Glennie Isle, NP-C sent at 05/30/2018  3:00 PM EDT ----- Can you tell her son that it is ok for her to take the xanax  ----- Message ----- From: Penelope Galas, RN Sent: 05/30/2018  12:44 PM EDT To: Glennie Isle, NP-C  Patient was discharged from the hospital and was instructed not to take her Xanax.  The son called asking if she could take the medication or another medicine due to his mom telling him she has "uncontrollable shaking".  Instructed the son the oncologist or NP would be sent a telephone communication for further instructions.  Verbalized understanding.   Telephone no. [phone]

## 2018-05-30 NOTE — Telephone Encounter (Signed)
Called and spoke with the son regarding xanax with understanding verbalized.  The son asked about his moms blood sugar levels.  Informed the son his mom called earlier and was instructed to call her PCP or go to the emergency room.  Notified the son who the PCP was listed inside her chart.  The son stated he would call his mom to make sure she knows what to do and who her PCP was in the chart.

## 2018-05-30 NOTE — Telephone Encounter (Signed)
Patient called stating her blood sugar was 329, she was shaking, not feeling well. She stated she is taking Metformin, stated she had to get insulin while in the hospital. I told her to call the primary doctor that she has an appointment with in November that will manage her blood sugar medications or either go back to the emergency room. She stated she understood.

## 2018-05-31 ENCOUNTER — Emergency Department (HOSPITAL_COMMUNITY): Payer: Medicaid Other

## 2018-05-31 ENCOUNTER — Other Ambulatory Visit: Payer: Self-pay

## 2018-05-31 ENCOUNTER — Encounter (HOSPITAL_COMMUNITY): Payer: Self-pay | Admitting: Emergency Medicine

## 2018-05-31 ENCOUNTER — Emergency Department (HOSPITAL_COMMUNITY)
Admission: EM | Admit: 2018-05-31 | Discharge: 2018-05-31 | Disposition: A | Discharge status: Home / Self Care | Payer: Medicaid Other | Attending: Emergency Medicine | Admitting: Emergency Medicine

## 2018-05-31 DIAGNOSIS — Z7984 Long term (current) use of oral hypoglycemic drugs: Secondary | ICD-10-CM | POA: Insufficient documentation

## 2018-05-31 DIAGNOSIS — N184 Chronic kidney disease, stage 4 (severe): Secondary | ICD-10-CM | POA: Diagnosis not present

## 2018-05-31 DIAGNOSIS — Z7982 Long term (current) use of aspirin: Secondary | ICD-10-CM | POA: Insufficient documentation

## 2018-05-31 DIAGNOSIS — Z79899 Other long term (current) drug therapy: Secondary | ICD-10-CM | POA: Insufficient documentation

## 2018-05-31 DIAGNOSIS — I129 Hypertensive chronic kidney disease with stage 1 through stage 4 chronic kidney disease, or unspecified chronic kidney disease: Secondary | ICD-10-CM | POA: Insufficient documentation

## 2018-05-31 DIAGNOSIS — R739 Hyperglycemia, unspecified: Secondary | ICD-10-CM | POA: Diagnosis present

## 2018-05-31 LAB — CBC WITH DIFFERENTIAL/PLATELET
ABS IMMATURE GRANULOCYTES: 0.11 10*3/uL — AB (ref 0.00–0.07)
BASOS ABS: 0 10*3/uL (ref 0.0–0.1)
BASOS PCT: 0 %
EOS PCT: 42 %
Eosinophils Absolute: 4.5 10*3/uL — ABNORMAL HIGH (ref 0.0–0.5)
HEMATOCRIT: 28.8 % — AB (ref 36.0–46.0)
HEMOGLOBIN: 9.1 g/dL — AB (ref 12.0–15.0)
Immature Granulocytes: 1 %
Lymphocytes Relative: 20 %
Lymphs Abs: 2.1 10*3/uL (ref 0.7–4.0)
MCH: 29.8 pg (ref 26.0–34.0)
MCHC: 31.6 g/dL (ref 30.0–36.0)
MCV: 94.4 fL (ref 80.0–100.0)
Monocytes Absolute: 0.6 10*3/uL (ref 0.1–1.0)
Monocytes Relative: 6 %
NEUTROS PCT: 31 %
NRBC: 0 % (ref 0.0–0.2)
Neutro Abs: 3.2 10*3/uL (ref 1.7–7.7)
Platelets: 274 10*3/uL (ref 150–400)
RBC: 3.05 MIL/uL — ABNORMAL LOW (ref 3.87–5.11)
RDW: 17.7 % — ABNORMAL HIGH (ref 11.5–15.5)
WBC: 10.4 10*3/uL (ref 4.0–10.5)

## 2018-05-31 LAB — CBG MONITORING, ED
GLUCOSE-CAPILLARY: 339 mg/dL — AB (ref 70–99)
Glucose-Capillary: 278 mg/dL — ABNORMAL HIGH (ref 70–99)

## 2018-05-31 LAB — COMPREHENSIVE METABOLIC PANEL
ALK PHOS: 149 U/L — AB (ref 38–126)
ALT: 23 U/L (ref 0–44)
ANION GAP: 8 (ref 5–15)
AST: 20 U/L (ref 15–41)
Albumin: 2.6 g/dL — ABNORMAL LOW (ref 3.5–5.0)
BILIRUBIN TOTAL: 0.6 mg/dL (ref 0.3–1.2)
BUN: 29 mg/dL — ABNORMAL HIGH (ref 8–23)
CALCIUM: 7.6 mg/dL — AB (ref 8.9–10.3)
CO2: 25 mmol/L (ref 22–32)
Chloride: 98 mmol/L (ref 98–111)
Creatinine, Ser: 1.64 mg/dL — ABNORMAL HIGH (ref 0.44–1.00)
GFR calc non Af Amer: 31 mL/min — ABNORMAL LOW (ref >60)
GFR, EST AFRICAN AMERICAN: 36 mL/min — AB (ref >60)
Glucose, Bld: 337 mg/dL — ABNORMAL HIGH (ref 70–99)
Potassium: 4.5 mmol/L (ref 3.5–5.1)
Sodium: 131 mmol/L — ABNORMAL LOW (ref 135–145)
TOTAL PROTEIN: 5.6 g/dL — AB (ref 6.5–8.1)

## 2018-05-31 LAB — CALCIUM, IONIZED: Calcium, Ionized, Serum: 4.3 mg/dL — ABNORMAL LOW (ref 4.5–5.6)

## 2018-05-31 LAB — HIV ANTIBODY (ROUTINE TESTING W REFLEX): HIV SCREEN 4TH GENERATION: NONREACTIVE

## 2018-05-31 MED ORDER — DIPHENHYDRAMINE HCL 50 MG/ML IJ SOLN
25.0000 mg | Freq: Once | INTRAMUSCULAR | Status: AC
  Administered 2018-05-31: 25 mg via INTRAVENOUS
  Filled 2018-05-31: qty 1

## 2018-05-31 MED ORDER — FAMOTIDINE 20 MG PO TABS: 20.0000 mg | ORAL_TABLET | Freq: Two times a day (BID) | ORAL | 0 refills | Status: DC

## 2018-05-31 MED ORDER — FAMOTIDINE IN NACL 20-0.9 MG/50ML-% IV SOLN
20.0000 mg | Freq: Once | INTRAVENOUS | Status: AC
  Administered 2018-05-31: 20 mg via INTRAVENOUS
  Filled 2018-05-31: qty 50

## 2018-05-31 MED ORDER — HYDROXYZINE HCL 25 MG PO TABS: 25.0000 mg | ORAL_TABLET | Freq: Four times a day (QID) | ORAL | 0 refills | Status: DC | PRN

## 2018-05-31 MED ORDER — SODIUM CHLORIDE 0.9 % IV BOLUS
1000.0000 mL | Freq: Once | INTRAVENOUS | Status: AC
  Administered 2018-05-31: 1000 mL via INTRAVENOUS

## 2018-05-31 NOTE — Discharge Instructions (Addendum)
Follow-up with your family doctor this week for recheck 

## 2018-05-31 NOTE — ED Triage Notes (Signed)
Pt here for increased CBG per pt of 368. Pt having shaking, and SOB and rash. Was seen here a few days ago and given prednisone for rash which is increasing her glucose she states.

## 2018-05-31 NOTE — ED Provider Notes (Signed)
[facility] EMERGENCY DEPARTMENT Provider Note   CSN: [id number] Arrival date & time: 05/31/18  1528     History   Chief Complaint Chief Complaint  Patient presents with  . Hyperglycemia    HPI Alexandra Price is a 72 y.o. female.  Patient presents with her sugar moderately elevated.  She has a rash that she is been put on prednisone for.  The history is provided by the patient. No language interpreter was used.  Illness  This is a recurrent problem. The problem occurs constantly. The problem has not changed since onset.Pertinent negatives include no chest pain, no abdominal pain and no headaches. Nothing aggravates the symptoms. Nothing relieves the symptoms. She has tried nothing for the symptoms. The treatment provided no relief.    Past Medical History:  Diagnosis Date  . Anxiety   . Depression   . Hypertension   . Multiple myeloma ([place])    multiple myeloma    Patient Active Problem List   Diagnosis Date Noted  . Depression 05/29/2018  . Hyperglycemia 05/28/2018  . HTN (hypertension) 05/28/2018  . Anxiety 05/28/2018  . Rash 05/27/2018  . Pseudomonas infection 05/26/2018  . SOB (shortness of breath) 05/24/2018  . CAP (community acquired pneumonia) 05/23/2018  . Fever 05/22/2018  . Sinus tachycardia 05/22/2018  . CKD (chronic kidney disease) stage 4, GFR 15-29 ml/min (HCC) 05/22/2018  . S/P laparoscopic appendectomy 05/20/2018  . Multiple myeloma without remission ([place]) 04/15/2018  . Goals of care, counseling/discussion 04/15/2018  . Multiple myeloma not having achieved remission ([place]) 04/07/2018    Past Surgical History:  Procedure Laterality Date  . ABDOMINAL HYSTERECTOMY     total  . LAPAROSCOPIC APPENDECTOMY N/A 05/20/2018   Procedure: APPENDECTOMY LAPAROSCOPIC;  Surgeon: [name], MD;  Location: [facility];  Service: General;  Laterality: N/A;  . PORTACATH PLACEMENT Right 04/14/2018   Procedure: INSERTION PORT-A-CATH;  Surgeon: [name], MD;   Location: [facility];  Service: General;  Laterality: Right;     OB History   None      Home Medications    Prior to Admission medications   Medication Sig Start Date End Date Taking? Authorizing Provider  acyclovir (ZOVIRAX) 400 MG tablet Take 1 tablet (400 mg total) by mouth 2 (two) times daily. 05/23/18  Yes Johnson, Clanford L, MD  allopurinol (ZYLOPRIM) 300 MG tablet Take 1 tablet (300 mg total) by mouth daily. 04/24/18  Yes [name], MD  aspirin EC 81 MG tablet Take 81 mg by mouth daily.   Yes [provider]  Calcium Carb-Cholecalciferol (CALCIUM 1000 + D PO) Take 1,000 mg by mouth daily. Take one tablet daily until next visit with oncologist.   Yes [provider]  citalopram (CELEXA) 20 MG tablet Take 1 tablet (20 mg total) by mouth daily. 05/30/18  Yes Kathie Dike, MD  feeding supplement, ENSURE ENLIVE, (ENSURE ENLIVE) LIQD Take 237 mLs by mouth 2 (two) times daily between meals for 14 days. 05/24/18 06/07/18 Yes Johnson, Clanford L, MD  HYDROcodone-acetaminophen (NORCO) 5-325 MG tablet Take 1 tablet by mouth 2 (two) times daily. 05/15/18  Yes Lockamy, Randi L, NP-C  hydrocortisone cream 1 % Apply topically 2 (two) times daily. 05/29/18  Yes Kathie Dike, MD  lisinopril (PRINIVIL,ZESTRIL) 10 MG tablet Take 1 tablet (10 mg total) by mouth daily. 04/10/18  Yes Nat Christen, MD  metFORMIN (GLUCOPHAGE) 500 MG tablet Take 1 tablet (500 mg total) by mouth 2 (two) times daily with a meal. 05/29/18 05/29/19 Yes  Kathie Dike, MD  ondansetron (ZOFRAN) 4 MG tablet Take 1 tablet (4 mg total) by mouth every 8 (eight) hours as needed for nausea or vomiting. 04/22/18  Yes Lockamy, Randi L, NP-C  pantoprazole (PROTONIX) 40 MG tablet Take 1 tablet (40 mg total) by mouth daily. 05/09/18  Yes Lockamy, Randi L, NP-C  polyethylene glycol (MIRALAX / GLYCOLAX) packet Take 17 g by mouth daily. 05/23/18 06/22/18 Yes Johnson, Clanford L, MD  predniSONE (DELTASONE) 50 MG tablet Take 1  tablet (50 mg total) by mouth daily with breakfast. 05/30/18  Yes Kathie Dike, MD  prochlorperazine (COMPAZINE) 10 MG tablet Take 1 tablet (10 mg total) by mouth every 6 (six) hours as needed (Nausea or vomiting). 04/15/18  Yes [name], MD  trolamine salicylate (ASPERCREME) 10 % cream Apply 1 application topically daily as needed for muscle pain.   Yes [provider]  famotidine (PEPCID) 20 MG tablet Take 1 tablet (20 mg total) by mouth 2 (two) times daily. 05/31/18   Milton Ferguson, MD  hydrOXYzine (ATARAX/VISTARIL) 25 MG tablet Take 1 tablet (25 mg total) by mouth every 6 (six) hours as needed for itching (rash). 05/31/18   Milton Ferguson, MD  lidocaine-prilocaine (EMLA) cream Apply to affected area once 04/15/18   [name], MD    Family History Family History  Problem Relation Age of Onset  . Heart disease Mother   . Emphysema Father   . Diabetes Sister   . Cancer Brother        liver, lung, and colon  . Diabetes Brother     Social History Social History   Tobacco Use  . Smoking status: Never Smoker  . Smokeless tobacco: Never Used  Substance Use Topics  . Alcohol use: Never    Frequency: Never  . Drug use: Not Currently     Allergies   Morphine and related   Review of Systems Review of Systems  Constitutional: Negative for appetite change and fatigue.  HENT: Negative for congestion, ear discharge and sinus pressure.   Eyes: Negative for discharge.  Respiratory: Negative for cough.   Cardiovascular: Negative for chest pain.  Gastrointestinal: Negative for abdominal pain and diarrhea.  Genitourinary: Negative for frequency and hematuria.  Musculoskeletal: Negative for back pain.  Skin: Positive for rash.  Neurological: Negative for seizures and headaches.  Psychiatric/Behavioral: Negative for hallucinations.     Physical Exam Updated Vital Signs BP (!) 141/79   Pulse 100   Temp 98.5 F (36.9 C) (Oral)   Resp 18   Ht 5'  6" (1.676 m)   Wt 80.6 kg   SpO2 98%   BMI 28.68 kg/m   Physical Exam  Constitutional: She is oriented to person, place, and time. She appears well-developed.  HENT:  Head: Normocephalic.  Eyes: Conjunctivae and EOM are normal. No scleral icterus.  Neck: Neck supple. No thyromegaly present.  Cardiovascular: Normal rate and regular rhythm. Exam reveals no gallop and no friction rub.  No murmur heard. Pulmonary/Chest: No stridor. She has no wheezes. She has no rales. She exhibits no tenderness.  Abdominal: She exhibits no distension. There is no tenderness. There is no rebound.  Musculoskeletal: Normal range of motion. She exhibits no edema.  Lymphadenopathy:    She has no cervical adenopathy.  Neurological: She is oriented to person, place, and time. She exhibits normal muscle tone. Coordination normal.  Skin: Rash noted. There is erythema.  Maculopapular rash throughout chest  Psychiatric: She has a normal mood and affect. Her  behavior is normal.     ED Treatments / Results  Labs (all labs ordered are listed, but only abnormal results are displayed) Labs Reviewed  CBC WITH DIFFERENTIAL/PLATELET - Abnormal; Notable for the following components:      Result Value   RBC 3.05 (*)    Hemoglobin 9.1 (*)    HCT 28.8 (*)    RDW 17.7 (*)    Eosinophils Absolute 4.5 (*)    Abs Immature Granulocytes 0.11 (*)    All other components within normal limits  COMPREHENSIVE METABOLIC PANEL - Abnormal; Notable for the following components:   Sodium 131 (*)    Glucose, Bld 337 (*)    BUN 29 (*)    Creatinine, Ser 1.64 (*)    Calcium 7.6 (*)    Total Protein 5.6 (*)    Albumin 2.6 (*)    Alkaline Phosphatase 149 (*)    GFR calc non Af Amer 31 (*)    GFR calc Af Amer 36 (*)    All other components within normal limits  CBG MONITORING, ED - Abnormal; Notable for the following components:   Glucose-Capillary 339 (*)    All other components within normal limits  CBG MONITORING, ED -  Abnormal; Notable for the following components:   Glucose-Capillary 278 (*)    All other components within normal limits  PATHOLOGIST SMEAR REVIEW    EKG None  Radiology Dg Chest 2 View  Result Date: 05/31/2018 CLINICAL DATA:  Shortness of breath. EXAM: CHEST - 2 VIEW COMPARISON:  Chest x-ray dated May 27, 2018. FINDINGS: Unchanged right chest wall port catheter. The heart size and mediastinal contours are within normal limits. Normal pulmonary vascularity. No focal consolidation, pleural effusion, or pneumothorax. No acute osseous abnormality. Unchanged lower thoracic compression fracture. Lytic lesions involving both clavicles, scapula, and proximal humeri are similar to prior study. IMPRESSION: 1.  No active cardiopulmonary disease. 2. Sequela of multiple myeloma, unchanged. Electronically Signed   By: Titus Dubin M.D.   On: 05/31/2018 17:22    Procedures Procedures (including critical care time)  Medications Ordered in ED Medications  sodium chloride 0.9 % bolus 1,000 mL (0 mLs Intravenous Stopped 05/31/18 1715)  famotidine (PEPCID) IVPB 20 mg premix (0 mg Intravenous Stopped 05/31/18 1646)  diphenhydrAMINE (BENADRYL) injection 25 mg (25 mg Intravenous Given 05/31/18 1615)     Initial Impression / Assessment and Plan / ED Course  I have reviewed the triage vital signs and the nursing notes.  Pertinent labs & imaging results that were available during my care of the patient were reviewed by me and considered in my medical decision making (see chart for details).     She is glucose improved with fluids.  She is told to continue the prednisone for her rash and we have added Pepcid and a stronger Atarax.  She will follow-up with her PCP this week  Final Clinical Impressions(s) / ED Diagnoses   Final diagnoses:  Hyperglycemia    ED Discharge Orders         Ordered    famotidine (PEPCID) 20 MG tablet  2 times daily     05/31/18 1843    hydrOXYzine (ATARAX/VISTARIL)  25 MG tablet  Every 6 hours PRN     05/31/18 1843           Milton Ferguson, MD 05/31/18 1846

## 2018-06-01 LAB — CULTURE, BLOOD (ROUTINE X 2)
CULTURE: NO GROWTH
Culture: NO GROWTH
Special Requests: ADEQUATE
Special Requests: ADEQUATE

## 2018-06-02 LAB — PATHOLOGIST SMEAR REVIEW

## 2018-06-03 ENCOUNTER — Encounter: Payer: Self-pay | Admitting: General Surgery

## 2018-06-03 ENCOUNTER — Ambulatory Visit (INDEPENDENT_AMBULATORY_CARE_PROVIDER_SITE_OTHER): Payer: Self-pay | Admitting: General Surgery

## 2018-06-03 VITALS — BP 126/79 | HR 98 | Temp 98.4°F | Resp 18 | Wt 163.8 lb

## 2018-06-03 DIAGNOSIS — Z09 Encounter for follow-up examination after completed treatment for conditions other than malignant neoplasm: Secondary | ICD-10-CM

## 2018-06-03 NOTE — Progress Notes (Signed)
Subjective:     Alexandra Price  Status post laparoscopic appendectomy.  Patient was doing well but developed a total body rash.  She is not taking her antibiotic anymore. Objective:    BP 126/79 (BP Location: Left Arm, Patient Position: Sitting, Cuff Size: Normal)   Pulse 98   Temp 98.4 F (36.9 C) (Temporal)   Resp 18   Wt 163 lb 12.8 oz (74.3 kg)   BMI 26.44 kg/m   General:  alert, cooperative and no distress  Abdomen soft, incisions healing well.  Staples removed.  Patient does have truncal and extremity rash. Mouth examination reveals no oral ulcerations or inflammation. Final pathology consistent with diagnosis.     Assessment:    Patient is doing well from the surgery, though she has developed a rash of unknown etiology.  This is probably drug-induced.  Any recently prescribed drugs have been stopped.  Patient is taking Benadryl for symptomatic itching.    Plan:   Will give 6-day course of steroid Dosepak.  Patient to follow-up in 1 week.  Was instructed to go the emergency room should things worsen.

## 2018-06-04 ENCOUNTER — Observation Stay (HOSPITAL_COMMUNITY)
Admission: EM | Admit: 2018-06-04 | Discharge: 2018-06-05 | Disposition: A | Discharge status: Home / Self Care | Payer: Medicaid Other | Attending: Internal Medicine | Admitting: Internal Medicine

## 2018-06-04 ENCOUNTER — Other Ambulatory Visit: Payer: Self-pay

## 2018-06-04 ENCOUNTER — Emergency Department (HOSPITAL_COMMUNITY): Payer: Medicaid Other

## 2018-06-04 ENCOUNTER — Encounter (HOSPITAL_COMMUNITY): Payer: Self-pay | Admitting: Emergency Medicine

## 2018-06-04 DIAGNOSIS — Z79899 Other long term (current) drug therapy: Secondary | ICD-10-CM | POA: Diagnosis not present

## 2018-06-04 DIAGNOSIS — D631 Anemia in chronic kidney disease: Secondary | ICD-10-CM | POA: Diagnosis not present

## 2018-06-04 DIAGNOSIS — K219 Gastro-esophageal reflux disease without esophagitis: Secondary | ICD-10-CM | POA: Diagnosis not present

## 2018-06-04 DIAGNOSIS — I129 Hypertensive chronic kidney disease with stage 1 through stage 4 chronic kidney disease, or unspecified chronic kidney disease: Secondary | ICD-10-CM | POA: Diagnosis not present

## 2018-06-04 DIAGNOSIS — R0789 Other chest pain: Secondary | ICD-10-CM | POA: Diagnosis not present

## 2018-06-04 DIAGNOSIS — F329 Major depressive disorder, single episode, unspecified: Secondary | ICD-10-CM | POA: Insufficient documentation

## 2018-06-04 DIAGNOSIS — E1122 Type 2 diabetes mellitus with diabetic chronic kidney disease: Secondary | ICD-10-CM | POA: Insufficient documentation

## 2018-06-04 DIAGNOSIS — K921 Melena: Secondary | ICD-10-CM | POA: Diagnosis not present

## 2018-06-04 DIAGNOSIS — R079 Chest pain, unspecified: Secondary | ICD-10-CM | POA: Diagnosis present

## 2018-06-04 DIAGNOSIS — R008 Other abnormalities of heart beat: Secondary | ICD-10-CM | POA: Diagnosis not present

## 2018-06-04 DIAGNOSIS — C9000 Multiple myeloma not having achieved remission: Secondary | ICD-10-CM | POA: Diagnosis not present

## 2018-06-04 DIAGNOSIS — Z7982 Long term (current) use of aspirin: Secondary | ICD-10-CM | POA: Diagnosis not present

## 2018-06-04 DIAGNOSIS — N184 Chronic kidney disease, stage 4 (severe): Secondary | ICD-10-CM | POA: Insufficient documentation

## 2018-06-04 DIAGNOSIS — M8450XA Pathological fracture in neoplastic disease, unspecified site, initial encounter for fracture: Secondary | ICD-10-CM

## 2018-06-04 LAB — COMPREHENSIVE METABOLIC PANEL
ALT: 29 U/L (ref 0–44)
ANION GAP: 11 (ref 5–15)
AST: 23 U/L (ref 15–41)
Albumin: 3.1 g/dL — ABNORMAL LOW (ref 3.5–5.0)
Alkaline Phosphatase: 135 U/L — ABNORMAL HIGH (ref 38–126)
BILIRUBIN TOTAL: 0.6 mg/dL (ref 0.3–1.2)
BUN: 24 mg/dL — ABNORMAL HIGH (ref 8–23)
CHLORIDE: 99 mmol/L (ref 98–111)
CO2: 23 mmol/L (ref 22–32)
Calcium: 6.9 mg/dL — ABNORMAL LOW (ref 8.9–10.3)
Creatinine, Ser: 1.41 mg/dL — ABNORMAL HIGH (ref 0.44–1.00)
GFR, EST AFRICAN AMERICAN: 43 mL/min — AB (ref >60)
GFR, EST NON AFRICAN AMERICAN: 37 mL/min — AB (ref >60)
Glucose, Bld: 254 mg/dL — ABNORMAL HIGH (ref 70–99)
POTASSIUM: 4.2 mmol/L (ref 3.5–5.1)
Sodium: 133 mmol/L — ABNORMAL LOW (ref 135–145)
TOTAL PROTEIN: 6.1 g/dL — AB (ref 6.5–8.1)

## 2018-06-04 LAB — PROTIME-INR
INR: 0.98
PROTHROMBIN TIME: 12.9 s (ref 11.4–15.2)

## 2018-06-04 LAB — CBG MONITORING, ED
GLUCOSE-CAPILLARY: 234 mg/dL — AB (ref 70–99)
Glucose-Capillary: 208 mg/dL — ABNORMAL HIGH (ref 70–99)

## 2018-06-04 LAB — CBC
HCT: 32.1 % — ABNORMAL LOW (ref 36.0–46.0)
HEMOGLOBIN: 10.1 g/dL — AB (ref 12.0–15.0)
MCH: 29.9 pg (ref 26.0–34.0)
MCHC: 31.5 g/dL (ref 30.0–36.0)
MCV: 95 fL (ref 80.0–100.0)
PLATELETS: 225 10*3/uL (ref 150–400)
RBC: 3.38 MIL/uL — ABNORMAL LOW (ref 3.87–5.11)
RDW: 17.8 % — ABNORMAL HIGH (ref 11.5–15.5)
WBC: 4.7 10*3/uL (ref 4.0–10.5)
nRBC: 0 % (ref 0.0–0.2)

## 2018-06-04 LAB — TROPONIN I: Troponin I: <0.03 ng/mL (ref <0.03)

## 2018-06-04 LAB — D-DIMER, QUANTITATIVE (NOT AT ARMC): D DIMER QUANT: 2.89 ug{FEU}/mL — AB (ref 0.00–0.50)

## 2018-06-04 MED ORDER — HYDROMORPHONE HCL 1 MG/ML IJ SOLN
1.0000 mg | Freq: Once | INTRAMUSCULAR | Status: AC
  Administered 2018-06-04: 1 mg via INTRAVENOUS
  Filled 2018-06-04: qty 1

## 2018-06-04 MED ORDER — POLYETHYLENE GLYCOL 3350 17 G PO PACK
17.0000 g | PACK | Freq: Every day | ORAL | Status: DC
  Administered 2018-06-05: 17 g via ORAL
  Filled 2018-06-04: qty 1

## 2018-06-04 MED ORDER — INSULIN ASPART 100 UNIT/ML ~~LOC~~ SOLN: 0.0000 [IU] | SUBCUTANEOUS | Status: DC

## 2018-06-04 MED ORDER — CITALOPRAM HYDROBROMIDE 20 MG PO TABS
20.0000 mg | ORAL_TABLET | Freq: Every day | ORAL | Status: DC
  Administered 2018-06-05: 20 mg via ORAL
  Filled 2018-06-04: qty 1

## 2018-06-04 MED ORDER — FENTANYL CITRATE (PF) 100 MCG/2ML IJ SOLN
75.0000 ug | Freq: Once | INTRAMUSCULAR | Status: AC | PRN
  Administered 2018-06-04: 75 ug via INTRAVENOUS
  Filled 2018-06-04: qty 2

## 2018-06-04 MED ORDER — SODIUM CHLORIDE 0.9 % IV SOLN
INTRAVENOUS | Status: AC
  Administered 2018-06-04: 22:00:00 via INTRAVENOUS

## 2018-06-04 MED ORDER — SODIUM CHLORIDE 0.9 % IV SOLN: 250.0000 mL | INTRAVENOUS | Status: DC | PRN

## 2018-06-04 MED ORDER — HYDROXYZINE HCL 25 MG PO TABS
25.0000 mg | ORAL_TABLET | Freq: Four times a day (QID) | ORAL | Status: DC | PRN
  Administered 2018-06-04: 25 mg via ORAL
  Filled 2018-06-04: qty 1

## 2018-06-04 MED ORDER — HYDROMORPHONE HCL 1 MG/ML IJ SOLN
0.5000 mg | INTRAMUSCULAR | Status: DC | PRN
  Administered 2018-06-04 – 2018-06-05 (×3): 0.5 mg via INTRAVENOUS
  Filled 2018-06-04 (×3): qty 0.5

## 2018-06-04 MED ORDER — ALLOPURINOL 300 MG PO TABS
300.0000 mg | ORAL_TABLET | Freq: Every day | ORAL | Status: DC
  Administered 2018-06-05: 300 mg via ORAL
  Filled 2018-06-04: qty 1

## 2018-06-04 MED ORDER — ACETAMINOPHEN 650 MG RE SUPP: 650.0000 mg | Freq: Four times a day (QID) | RECTAL | Status: DC | PRN

## 2018-06-04 MED ORDER — FAMOTIDINE 20 MG PO TABS
20.0000 mg | ORAL_TABLET | Freq: Two times a day (BID) | ORAL | Status: DC
  Administered 2018-06-04 – 2018-06-05 (×2): 20 mg via ORAL
  Filled 2018-06-04 (×2): qty 1

## 2018-06-04 MED ORDER — ACETAMINOPHEN 325 MG PO TABS: 650.0000 mg | ORAL_TABLET | Freq: Four times a day (QID) | ORAL | Status: DC | PRN

## 2018-06-04 MED ORDER — ASPIRIN 81 MG PO CHEW
324.0000 mg | CHEWABLE_TABLET | Freq: Once | ORAL | Status: AC
  Administered 2018-06-04: 324 mg via ORAL
  Filled 2018-06-04: qty 4

## 2018-06-04 MED ORDER — ACYCLOVIR 800 MG PO TABS
400.0000 mg | ORAL_TABLET | Freq: Two times a day (BID) | ORAL | Status: DC
  Administered 2018-06-04 – 2018-06-05 (×2): 400 mg via ORAL
  Filled 2018-06-04 (×2): qty 1

## 2018-06-04 MED ORDER — IOPAMIDOL (ISOVUE-370) INJECTION 76%
75.0000 mL | Freq: Once | INTRAVENOUS | Status: AC | PRN
  Administered 2018-06-04: 75 mL via INTRAVENOUS

## 2018-06-04 MED ORDER — INSULIN ASPART 100 UNIT/ML ~~LOC~~ SOLN
0.0000 [IU] | SUBCUTANEOUS | Status: DC
  Administered 2018-06-04: 3 [IU] via SUBCUTANEOUS
  Filled 2018-06-04: qty 1

## 2018-06-04 MED ORDER — ENOXAPARIN SODIUM 40 MG/0.4ML ~~LOC~~ SOLN
40.0000 mg | SUBCUTANEOUS | Status: DC
  Administered 2018-06-04: 40 mg via SUBCUTANEOUS
  Filled 2018-06-04: qty 0.4

## 2018-06-04 MED ORDER — IOPAMIDOL (ISOVUE-300) INJECTION 61%: 100.0000 mL | Freq: Once | INTRAVENOUS | Status: DC | PRN

## 2018-06-04 MED ORDER — PROCHLORPERAZINE MALEATE 5 MG PO TABS
10.0000 mg | ORAL_TABLET | Freq: Four times a day (QID) | ORAL | Status: DC | PRN
  Administered 2018-06-04: 10 mg via ORAL
  Filled 2018-06-04: qty 2

## 2018-06-04 MED ORDER — NITROGLYCERIN 0.4 MG SL SUBL
0.4000 mg | SUBLINGUAL_TABLET | SUBLINGUAL | Status: DC | PRN
  Administered 2018-06-04 (×3): 0.4 mg via SUBLINGUAL
  Filled 2018-06-04 (×3): qty 1

## 2018-06-04 MED ORDER — ASPIRIN EC 81 MG PO TBEC
81.0000 mg | DELAYED_RELEASE_TABLET | Freq: Every day | ORAL | Status: DC
  Administered 2018-06-05: 81 mg via ORAL
  Filled 2018-06-04: qty 1

## 2018-06-04 MED ORDER — PANTOPRAZOLE SODIUM 40 MG PO TBEC
40.0000 mg | DELAYED_RELEASE_TABLET | Freq: Every day | ORAL | Status: DC
  Administered 2018-06-04 – 2018-06-05 (×2): 40 mg via ORAL
  Filled 2018-06-04 (×2): qty 1

## 2018-06-04 MED ORDER — SODIUM CHLORIDE 0.9% FLUSH
3.0000 mL | Freq: Two times a day (BID) | INTRAVENOUS | Status: DC
  Administered 2018-06-04 – 2018-06-05 (×2): 3 mL via INTRAVENOUS

## 2018-06-04 MED ORDER — LISINOPRIL 10 MG PO TABS
10.0000 mg | ORAL_TABLET | Freq: Every day | ORAL | Status: DC
  Administered 2018-06-04 – 2018-06-05 (×2): 10 mg via ORAL
  Filled 2018-06-04 (×2): qty 1

## 2018-06-04 MED ORDER — SODIUM CHLORIDE 0.9% FLUSH: 3.0000 mL | INTRAVENOUS | Status: DC | PRN

## 2018-06-04 MED ORDER — ONDANSETRON HCL 4 MG/2ML IJ SOLN
4.0000 mg | Freq: Once | INTRAMUSCULAR | Status: AC
  Administered 2018-06-04: 4 mg via INTRAVENOUS
  Filled 2018-06-04: qty 2

## 2018-06-04 MED ORDER — HYDROCODONE-ACETAMINOPHEN 5-325 MG PO TABS
1.0000 | ORAL_TABLET | Freq: Two times a day (BID) | ORAL | Status: DC
  Administered 2018-06-04 – 2018-06-05 (×2): 1 via ORAL
  Filled 2018-06-04 (×2): qty 1

## 2018-06-04 MED ORDER — ONDANSETRON HCL 4 MG/2ML IJ SOLN: 4.0000 mg | Freq: Four times a day (QID) | INTRAMUSCULAR | Status: DC | PRN

## 2018-06-04 MED ORDER — ENSURE ENLIVE PO LIQD
237.0000 mL | Freq: Two times a day (BID) | ORAL | Status: DC
  Administered 2018-06-05: 237 mL via ORAL

## 2018-06-04 NOTE — H&P (Addendum)
TRH H&P   Patient Demographics:    Alexandra Price, is a 73 y.o. female  MRN: [id number]   DOB - Mar 29, 1946  Admit Date - 06/04/2018  Outpatient Primary MD for the patient is [facility]  Referring MD/NP/PA: Marye Round  Outpatient Specialists:  [facility] Mertens  Patient coming from: home  Chief Complaint  Patient presents with  . Chest Pain      HPI:    Alexandra Price  is a 72 y.o. female, w hypertension, dm2, ckd stage 3, multiple myeloma,  who presents with c/o chest pain "tightness" for the past 46month, mostly on the left side. Pt denies fever, chills, cough, palp, sob, n/v, diarrhea, brbpr.  Pt had cardiac echo10/04/2018 preserved EF.  Pt requesting reassurance not cardiac.    In Ed,  T 98.6, P 73  Bp 155/79  Pox 96% on RA  CTA chest  IMPRESSION: 1.  Negative for acute pulmonary embolus. 2. Diffuse lytic lesions throughout the visible skeleton in keeping with advanced Multiple Myeloma. Mild pathologic fractures of left posterolateral ribs and the T11 vertebral body. 3. Mild cardiomegaly.  No significant pulmonary abnormality.  Na 133, K 4.2, Bun 24 Creatinine 1.41 Alb 3.1  Ast 23, Alt 29, alk phos 135 T. Bili 0.6  Wbc 4.7, hgb 10.1, Plt 225  Trop <0.03  Pt will be admitted for chest pain        Review of systems:    In addition to the HPI above,  No Fever-chills, No Headache, No changes with Vision or hearing, No problems swallowing food or Liquids, No Cough or Shortness of Breath, No Abdominal pain, No Nausea or Vommitting, Bowel movements are regular, No Blood in stool or Urine, No dysuria, No new skin rashes or bruises, No new joints pains-aches,  No new weakness, tingling, numbness in any extremity, No recent weight gain or loss, No polyuria, polydypsia or polyphagia, No significant Mental Stressors.  A full 10 point  Review of Systems was done, except as stated above, all other Review of Systems were negative.   With Past History of the following :    Past Medical History:  Diagnosis Date  . Anxiety   . Depression   . Hypertension   . Multiple myeloma ([place]    multiple myeloma      Past Surgical History:  Procedure Laterality Date  . ABDOMINAL HYSTERECTOMY     total  . APPENDECTOMY    . LAPAROSCOPIC APPENDECTOMY N/A 05/20/2018   Procedure: APPENDECTOMY LAPAROSCOPIC;  Surgeon: JAviva Signs MD;  Location: [facility];  Service: General;  Laterality: N/A;  . PORTACATH PLACEMENT Right 04/14/2018   Procedure: INSERTION PORT-A-CATH;  Surgeon: JAviva Signs MD;  Location: [facility];  Service: General;  Laterality: Right;      Social History:     Social History   Tobacco Use  . Smoking status: Never  Smoker  . Smokeless tobacco: Never Used  Substance Use Topics  . Alcohol use: Never    Frequency: Never     Lives - at home  Mobility - walks by self   Family History :     Family History  Problem Relation Age of Onset  . Heart disease Mother   . Emphysema Father   . Diabetes Sister   . Cancer Brother        liver, lung, and colon  . Diabetes Brother        Home Medications:   Prior to Admission medications   Medication Sig Start Date End Date Taking? Authorizing Provider  acyclovir (ZOVIRAX) 400 MG tablet Take 1 tablet (400 mg total) by mouth 2 (two) times daily. 05/23/18  Yes Johnson, Clanford L, MD  allopurinol (ZYLOPRIM) 300 MG tablet Take 1 tablet (300 mg total) by mouth daily. 04/24/18  Yes [name], MD  aspirin EC 81 MG tablet Take 81 mg by mouth daily.   Yes [provider]  Calcium Carb-Cholecalciferol (CALCIUM 1000 + D PO) Take 1,000 mg by mouth daily. Take one tablet daily until next visit with oncologist.   Yes [provider]  citalopram (CELEXA) 20 MG tablet Take 1 tablet (20 mg total) by mouth daily. 05/30/18  Yes Kathie Dike, MD    famotidine (PEPCID) 20 MG tablet Take 1 tablet (20 mg total) by mouth 2 (two) times daily. 05/31/18  Yes Milton Ferguson, MD  feeding supplement, ENSURE ENLIVE, (ENSURE ENLIVE) LIQD Take 237 mLs by mouth 2 (two) times daily between meals for 14 days. 05/24/18 06/07/18 Yes Johnson, Clanford L, MD  HYDROcodone-acetaminophen (NORCO) 5-325 MG tablet Take 1 tablet by mouth 2 (two) times daily. 05/15/18  Yes Lockamy, Randi L, NP-C  hydrocortisone cream 1 % Apply topically 2 (two) times daily. 05/29/18  Yes Kathie Dike, MD  hydrOXYzine (ATARAX/VISTARIL) 25 MG tablet Take 1 tablet (25 mg total) by mouth every 6 (six) hours as needed for itching (rash). 05/31/18  Yes Milton Ferguson, MD  lidocaine-prilocaine (EMLA) cream Apply to affected area once 04/15/18  Yes [name], MD  metFORMIN (GLUCOPHAGE) 500 MG tablet Take 1 tablet (500 mg total) by mouth 2 (two) times daily with a meal. 05/29/18 05/29/19 Yes Memon, Jolaine Artist, MD  ondansetron (ZOFRAN) 4 MG tablet Take 1 tablet (4 mg total) by mouth every 8 (eight) hours as needed for nausea or vomiting. 04/22/18  Yes Lockamy, Randi L, NP-C  predniSONE (DELTASONE) 50 MG tablet Take 1 tablet (50 mg total) by mouth daily with breakfast. 05/30/18  Yes Kathie Dike, MD  prochlorperazine (COMPAZINE) 10 MG tablet Take 1 tablet (10 mg total) by mouth every 6 (six) hours as needed (Nausea or vomiting). 04/15/18  Yes [name], MD  trolamine salicylate (ASPERCREME) 10 % cream Apply 1 application topically daily as needed for muscle pain.   Yes [provider]  ciprofloxacin (CIPRO) 500 MG tablet Take 500 mg by mouth 2 (two) times daily.    [provider]  lisinopril (PRINIVIL,ZESTRIL) 10 MG tablet Take 1 tablet (10 mg total) by mouth daily. 04/10/18   Nat Christen, MD  pantoprazole (PROTONIX) 40 MG tablet Take 1 tablet (40 mg total) by mouth daily. 05/09/18   Lockamy, Randi L, NP-C  polyethylene glycol (MIRALAX / GLYCOLAX) packet Take 17 g  by mouth daily. 05/23/18 06/22/18  Murlean Iba, MD     Allergies:     Allergies  Allergen Reactions  . Ciprofloxacin Anaphylaxis  .  Morphine And Related Nausea And Vomiting     Physical Exam:   Vitals  Blood pressure (!) 155/79, pulse 73, temperature 98.6 F (37 C), temperature source Oral, resp. rate 13, height 5' 6"  (1.676 m), weight 73.5 kg, SpO2 96 %.   1. General  lying in bed in NAD,   2. Normal affect and insight, Not Suicidal or Homicidal, Awake Alert, Oriented X 3.  3. No F.N deficits, ALL C.Nerves Intact, Strength 5/5 all 4 extremities, Sensation intact all 4 extremities, Plantars down going.  4. Ears and Eyes appear Normal, Conjunctivae clear, PERRLA. Moist Oral Mucosa.  5. Supple Neck, No JVD, No cervical lymphadenopathy appriciated, No Carotid Bruits.  6. Symmetrical Chest wall movement, Good air movement bilaterally, CTAB.  7. RRR, No Gallops, Rubs or Murmurs, No Parasternal Heave. Chest pain w palpation over the left side  8. Positive Bowel Sounds, Abdomen Soft, No tenderness, No organomegaly appriciated,No rebound -guarding or rigidity.  9.  No Cyanosis, Normal Skin Turgor, No Skin Rash or Bruise.  10. Good muscle tone,  joints appear normal , no effusions, Normal ROM.  11. No Palpable Lymph Nodes in Neck or Axillae     Data Review:    CBC Recent Labs  Lab 05/31/18 1621 06/04/18 1315  WBC 10.4 4.7  HGB 9.1* 10.1*  HCT 28.8* 32.1*  PLT 274 225  MCV 94.4 95.0  MCH 29.8 29.9  MCHC 31.6 31.5  RDW 17.7* 17.8*  LYMPHSABS 2.1  --   MONOABS 0.6  --   EOSABS 4.5*  --   BASOSABS 0.0  --    ------------------------------------------------------------------------------------------------------------------  Chemistries  Recent Labs  Lab 05/31/18 1621 06/04/18 1315  NA 131* 133*  K 4.5 4.2  CL 98 99  CO2 25 23  GLUCOSE 337* 254*  BUN 29* 24*  CREATININE 1.64* 1.41*  CALCIUM 7.6* 6.9*  AST 20 23  ALT 23 29  ALKPHOS 149* 135*    BILITOT 0.6 0.6   ------------------------------------------------------------------------------------------------------------------ estimated creatinine clearance is 38.1 mL/min (A) (by C-G formula based on SCr of 1.41 mg/dL (H)). ------------------------------------------------------------------------------------------------------------------ No results for input(s): TSH, T4TOTAL, T3FREE, THYROIDAB in the last 72 hours.  Invalid input(s): FREET3  Coagulation profile Recent Labs  Lab 06/04/18 1315  INR 0.98   ------------------------------------------------------------------------------------------------------------------- Recent Labs    06/04/18 1315  DDIMER 2.89*   -------------------------------------------------------------------------------------------------------------------  Cardiac Enzymes Recent Labs  Lab 06/04/18 1315 06/04/18 1659  TROPONINI <0.03 <0.03   ------------------------------------------------------------------------------------------------------------------ No results found for: BNP   ---------------------------------------------------------------------------------------------------------------  Urinalysis    Component Value Date/Time   COLORURINE YELLOW 05/27/2018 [place] 05/27/2018 1645   LABSPEC 1.006 05/27/2018 1645   PHURINE 5.0 05/27/2018 1645   GLUCOSEU >=500 (A) 05/27/2018 1645   HGBUR NEGATIVE 05/27/2018 1645   BILIRUBINUR NEGATIVE 05/27/2018 1645   KETONESUR NEGATIVE 05/27/2018 1645   PROTEINUR NEGATIVE 05/27/2018 1645   NITRITE NEGATIVE 05/27/2018 1645   LEUKOCYTESUR NEGATIVE 05/27/2018 1645    ----------------------------------------------------------------------------------------------------------------   Imaging Results:    Ct Angio Chest Pe W And/or Wo Contrast  Result Date: 06/04/2018 CLINICAL DATA:  This study identified as missing a report due to unplanned IT downtime at 6:23 pm on 06/04/2018.  72 year old female with chest pain and shortness of breath for 2 days. Multiple myeloma. EXAM: CT ANGIOGRAPHY CHEST WITH CONTRAST TECHNIQUE: Multidetector CT imaging of the chest was performed using the standard protocol during bolus administration of intravenous contrast. Multiplanar CT image reconstructions and MIPs were obtained to evaluate the vascular  anatomy. CONTRAST:  12m ISOVUE-370 IOPAMIDOL (ISOVUE-370) INJECTION 76% COMPARISON:  CT Abdomen and Pelvis 05/20/2018. FINDINGS: Cardiovascular: Good contrast bolus timing in the pulmonary arterial tree. No focal filling defect identified in the pulmonary arteries to suggest acute pulmonary embolism. Mild cardiomegaly. No pericardial effusion. Mild Calcified aortic atherosclerosis. Otherwise negative visible aorta. Mediastinum/Nodes: Negative.  No lymphadenopathy. Lungs/Pleura: Major airways are patent. There is curvilinear scarring or atelectasis in the medial basal segment of the left lower lobe. No pleural effusion or other abnormal pulmonary opacity. Upper Abdomen: Negative visible liver, gallbladder, spleen, pancreas, adrenal glands, kidneys, and bowel in the upper abdomen. Musculoskeletal: Diffusely abnormal visible skeletal structures with innumerable lytic lesions. Expansile lesion of the distal left clavicle. Pathologic fracture of the T11 vertebral body. Mild bony retropulsion without significant spinal stenosis. Mild expansion of the manubrium. Multilevel mildly displaced probably pathologic fractures of the posterolateral left ribs. Review of the MIP images confirms the above findings. IMPRESSION: 1.  Negative for acute pulmonary embolus. 2. Diffuse lytic lesions throughout the visible skeleton in keeping with advanced Multiple Myeloma. Mild pathologic fractures of left posterolateral ribs and the T11 vertebral body. 3. Mild cardiomegaly.  No significant pulmonary abnormality. Electronically Signed   By: HGenevie AnnM.D.   On: 06/04/2018 18:28   Dg  Chest Portable 1 View  Result Date: 06/04/2018 CLINICAL DATA:  Chest pain radiating to the abdomen. Dyspnea x2 days. EXAM: PORTABLE CHEST 1 VIEW COMPARISON:  05/31/2018 FINDINGS: Normal heart size. Mild aortic atherosclerosis without aneurysm rib port catheter tip terminates in the distal SVC. Mild diffuse chronic interstitial prominence is identified without alveolar consolidation, CHF, effusion or pneumothorax. Diffuse scattered lytic lucencies are noted about the bony thorax, humeri and both shoulders compatible with history of multiple myeloma. IMPRESSION: Chronic coarsened interstitial lung markings without alveolar consolidation or CHF. Aortic atherosclerosis. Redemonstration of lytic lucencies about the bony thorax and shoulders compatible with history of multiple myeloma. Electronically Signed   By: DAshley RoyaltyM.D.   On: 06/04/2018 13:39    ekg nsr at 90, nl axis, slight j point elevation in v2,     Assessment & Plan:    Active Problems:   Chest pain    Chest pain Tele Trop iq6h x3 Check , hga1c, lipid NPO after midnite Cardiology consult ordered in computer Pain control with dilaudid Aspirin 818mpo qday Lipitor 8068mo qhs Continue Lisinopril   Hypertension Cont Lisinopril 12m43m qday  Dm2 HOLD Metformin fsbs ac and qhs, ISS  Gerd Cont pepcid  Multiple myeloma with bone lesions Continue Norco Start dilaudid 0.5mg 25mq4h prn  DVT Prophylaxis  Lovenox - SCDs  AM Labs Ordered, also please review Full Orders  Family Communication: Admission, patients condition and plan of care including tests being ordered have been discussed with the patient  who indicate understanding and agree with the plan and Code Status.  Code Status  FULL CODE  Likely DC to  home  Condition GUARDED    Consults called: none  Admission status: observation, pt requies hospitalization for evaluation of chest pain  Time spent in minutes : 70   JamesJani Gravelon 06/04/2018 at 6:55  PM  Between 7am to 7pm - Pager - [phone] 7pm go to [email or web address] - password [email or web address] - Office  [phone]

## 2018-06-04 NOTE — ED Provider Notes (Signed)
[facility] EMERGENCY DEPARTMENT Provider Note   CSN: [id number] Arrival date & time: 06/04/18  1252     History   Chief Complaint Chief Complaint  Patient presents with  . Chest Pain    HPI CREE NAPOLI is a 72 y.o. female.  HPI Presents to the emergency room for evaluation of chest pain.  Patient states her symptoms started last evening.  They have been really constant since then but they wax and wane in intensity and severity.  His episodes seem to last several minutes.  This morning the pain is more severe.  Patient states the pain is sharp on the left side of her chest.  Moves towards her abdomen.  She also felt short of breath.  Nothing seems to make the pain particularly worse or better.  Deep breathing or exertion does not increase it.  She denies any fevers or chills.  She does not have a history of PE or DVT.  No history of heart disease.  She does not smoke.  She does have history of multiple myeloma and her last oncology treatment was the end of September.  Patient has a recent history of abdominal pain resulting in acute appendicitis.  Patient had an appendectomy on October 1.  She ended up developing a diffuse maculopapular rash that they thought was related to a drug rash. Past Medical History:  Diagnosis Date  . Anxiety   . Depression   . Hypertension   . Multiple myeloma ([place])    multiple myeloma    Patient Active Problem List   Diagnosis Date Noted  . Depression 05/29/2018  . Hyperglycemia 05/28/2018  . HTN (hypertension) 05/28/2018  . Anxiety 05/28/2018  . Rash 05/27/2018  . Pseudomonas infection 05/26/2018  . SOB (shortness of breath) 05/24/2018  . CAP (community acquired pneumonia) 05/23/2018  . Fever 05/22/2018  . Sinus tachycardia 05/22/2018  . CKD (chronic kidney disease) stage 4, GFR 15-29 ml/min (HCC) 05/22/2018  . S/P laparoscopic appendectomy 05/20/2018  . Multiple myeloma without remission ([place]) 04/15/2018  . Goals of care,  counseling/discussion 04/15/2018  . Multiple myeloma not having achieved remission ([place]) 04/07/2018    Past Surgical History:  Procedure Laterality Date  . ABDOMINAL HYSTERECTOMY     total  . APPENDECTOMY    . LAPAROSCOPIC APPENDECTOMY N/A 05/20/2018   Procedure: APPENDECTOMY LAPAROSCOPIC;  Surgeon: [name], MD;  Location: [facility];  Service: General;  Laterality: N/A;  . PORTACATH PLACEMENT Right 04/14/2018   Procedure: INSERTION PORT-A-CATH;  Surgeon: [name], MD;  Location: [facility];  Service: General;  Laterality: Right;     OB History   None      Home Medications    Prior to Admission medications   Medication Sig Start Date End Date Taking? Authorizing Provider  acyclovir (ZOVIRAX) 400 MG tablet Take 1 tablet (400 mg total) by mouth 2 (two) times daily. 05/23/18  Yes Johnson, Clanford L, MD  allopurinol (ZYLOPRIM) 300 MG tablet Take 1 tablet (300 mg total) by mouth daily. 04/24/18  Yes [name], MD  aspirin EC 81 MG tablet Take 81 mg by mouth daily.   Yes [provider]  Calcium Carb-Cholecalciferol (CALCIUM 1000 + D PO) Take 1,000 mg by mouth daily. Take one tablet daily until next visit with oncologist.   Yes [provider]  citalopram (CELEXA) 20 MG tablet Take 1 tablet (20 mg total) by mouth daily. 05/30/18  Yes Kathie Dike, MD  famotidine (PEPCID) 20 MG tablet Take 1 tablet (  20 mg total) by mouth 2 (two) times daily. 05/31/18  Yes Milton Ferguson, MD  feeding supplement, ENSURE ENLIVE, (ENSURE ENLIVE) LIQD Take 237 mLs by mouth 2 (two) times daily between meals for 14 days. 05/24/18 06/07/18 Yes Johnson, Clanford L, MD  HYDROcodone-acetaminophen (NORCO) 5-325 MG tablet Take 1 tablet by mouth 2 (two) times daily. 05/15/18  Yes Lockamy, Randi L, NP-C  hydrocortisone cream 1 % Apply topically 2 (two) times daily. 05/29/18  Yes Kathie Dike, MD  hydrOXYzine (ATARAX/VISTARIL) 25 MG tablet Take 1 tablet (25 mg total) by mouth every 6 (six)  hours as needed for itching (rash). 05/31/18  Yes Milton Ferguson, MD  lidocaine-prilocaine (EMLA) cream Apply to affected area once 04/15/18  Yes [name], MD  metFORMIN (GLUCOPHAGE) 500 MG tablet Take 1 tablet (500 mg total) by mouth 2 (two) times daily with a meal. 05/29/18 05/29/19 Yes Memon, Jolaine Artist, MD  ondansetron (ZOFRAN) 4 MG tablet Take 1 tablet (4 mg total) by mouth every 8 (eight) hours as needed for nausea or vomiting. 04/22/18  Yes Lockamy, Randi L, NP-C  predniSONE (DELTASONE) 50 MG tablet Take 1 tablet (50 mg total) by mouth daily with breakfast. 05/30/18  Yes Kathie Dike, MD  prochlorperazine (COMPAZINE) 10 MG tablet Take 1 tablet (10 mg total) by mouth every 6 (six) hours as needed (Nausea or vomiting). 04/15/18  Yes [name], MD  trolamine salicylate (ASPERCREME) 10 % cream Apply 1 application topically daily as needed for muscle pain.   Yes [provider]  ciprofloxacin (CIPRO) 500 MG tablet Take 500 mg by mouth 2 (two) times daily.    [provider]  lisinopril (PRINIVIL,ZESTRIL) 10 MG tablet Take 1 tablet (10 mg total) by mouth daily. 04/10/18   Nat Christen, MD  pantoprazole (PROTONIX) 40 MG tablet Take 1 tablet (40 mg total) by mouth daily. 05/09/18   Lockamy, Randi L, NP-C  polyethylene glycol (MIRALAX / GLYCOLAX) packet Take 17 g by mouth daily. 05/23/18 06/22/18  Murlean Iba, MD    Family History Family History  Problem Relation Age of Onset  . Heart disease Mother   . Emphysema Father   . Diabetes Sister   . Cancer Brother        liver, lung, and colon  . Diabetes Brother     Social History Social History   Tobacco Use  . Smoking status: Never Smoker  . Smokeless tobacco: Never Used  Substance Use Topics  . Alcohol use: Never    Frequency: Never  . Drug use: Not Currently     Allergies   Ciprofloxacin and Morphine and related   Review of Systems Review of Systems  All other systems reviewed and are  negative.    Physical Exam Updated Vital Signs BP (!) 145/75   Pulse 80   Temp 98.6 F (37 C) (Oral)   Resp 12   Ht 1.676 m (_0 )   Wt 73.5 kg   SpO2 99%   BMI 26.15 kg/m   Physical Exam  Constitutional: She appears well-developed. She appears ill. She appears distressed.  HENT:  Head: Normocephalic and atraumatic.  Right Ear: External ear normal.  Left Ear: External ear normal.  Eyes: Conjunctivae are normal. Right eye exhibits no discharge. Left eye exhibits no discharge. No scleral icterus.  Neck: Neck supple. No tracheal deviation present.  Cardiovascular: Normal rate, regular rhythm and intact distal pulses.  Pulmonary/Chest: Effort normal and breath sounds normal. No stridor. No respiratory distress. She has no  wheezes. She has no rales.  Abdominal: Soft. Bowel sounds are normal. She exhibits no distension. There is no tenderness. There is no rebound and no guarding.  Musculoskeletal: She exhibits no edema or tenderness.       Right lower leg: She exhibits no edema.       Left lower leg: She exhibits no edema.  Neurological: She is alert. She has normal strength. No cranial nerve deficit (no facial droop, extraocular movements intact, no slurred speech) or sensory deficit. She exhibits normal muscle tone. She displays no seizure activity. Coordination normal.  Skin: Skin is warm and dry. Rash noted.  Diffuse macular rash, noted on lower extremities  Psychiatric: She has a normal mood and affect.  Nursing note and vitals reviewed.    ED Treatments / Results  Labs (all labs ordered are listed, but only abnormal results are displayed) Labs Reviewed  CBC - Abnormal; Notable for the following components:      Result Value   RBC 3.38 (*)    Hemoglobin 10.1 (*)    HCT 32.1 (*)    RDW 17.8 (*)    All other components within normal limits  COMPREHENSIVE METABOLIC PANEL - Abnormal; Notable for the following components:   Sodium 133 (*)    Glucose, Bld 254 (*)    BUN  24 (*)    Creatinine, Ser 1.41 (*)    Calcium 6.9 (*)    Total Protein 6.1 (*)    Albumin 3.1 (*)    Alkaline Phosphatase 135 (*)    GFR calc non Af Amer 37 (*)    GFR calc Af Amer 43 (*)    All other components within normal limits  D-DIMER, QUANTITATIVE (NOT AT [facility]) - Abnormal; Notable for the following components:   D-Dimer, Quant 2.89 (*)    All other components within normal limits  CBG MONITORING, ED - Abnormal; Notable for the following components:   Glucose-Capillary 234 (*)    All other components within normal limits  TROPONIN I  TROPONIN I  PROTIME-INR    EKG EKG Interpretation  Date/Time:  Wednesday June 04 2018 13:03:36 EDT Ventricular Rate:  91 PR Interval:    QRS Duration: 88 QT Interval:  361 QTC Calculation: 445 R Axis:   88 Text Interpretation:  Sinus rhythm Short PR interval Borderline right axis deviation nonspecific lateral changes resolved since last tracing Confirmed by Dorie Rank [phone]) on 06/04/2018 1:16:12 PM   Radiology Ct Angio Chest Pe W And/or Wo Contrast  Result Date: 06/04/2018 CLINICAL DATA:  This study identified as missing a report due to unplanned IT downtime at 6:23 pm on 06/04/2018. 72 year old female with chest pain and shortness of breath for 2 days. Multiple myeloma. EXAM: CT ANGIOGRAPHY CHEST WITH CONTRAST TECHNIQUE: Multidetector CT imaging of the chest was performed using the standard protocol during bolus administration of intravenous contrast. Multiplanar CT image reconstructions and MIPs were obtained to evaluate the vascular anatomy. CONTRAST:  4m ISOVUE-370 IOPAMIDOL (ISOVUE-370) INJECTION 76% COMPARISON:  CT Abdomen and Pelvis 05/20/2018. FINDINGS: Cardiovascular: Good contrast bolus timing in the pulmonary arterial tree. No focal filling defect identified in the pulmonary arteries to suggest acute pulmonary embolism. Mild cardiomegaly. No pericardial effusion. Mild Calcified aortic atherosclerosis. Otherwise negative  visible aorta. Mediastinum/Nodes: Negative.  No lymphadenopathy. Lungs/Pleura: Major airways are patent. There is curvilinear scarring or atelectasis in the medial basal segment of the left lower lobe. No pleural effusion or other abnormal pulmonary opacity. Upper Abdomen: Negative visible liver, gallbladder,  spleen, pancreas, adrenal glands, kidneys, and bowel in the upper abdomen. Musculoskeletal: Diffusely abnormal visible skeletal structures with innumerable lytic lesions. Expansile lesion of the distal left clavicle. Pathologic fracture of the T11 vertebral body. Mild bony retropulsion without significant spinal stenosis. Mild expansion of the manubrium. Multilevel mildly displaced probably pathologic fractures of the posterolateral left ribs. Review of the MIP images confirms the above findings. IMPRESSION: 1.  Negative for acute pulmonary embolus. 2. Diffuse lytic lesions throughout the visible skeleton in keeping with advanced Multiple Myeloma. Mild pathologic fractures of left posterolateral ribs and the T11 vertebral body. 3. Mild cardiomegaly.  No significant pulmonary abnormality. Electronically Signed   By: Genevie Ann M.D.   On: 06/04/2018 18:28   Dg Chest Portable 1 View  Result Date: 06/04/2018 CLINICAL DATA:  Chest pain radiating to the abdomen. Dyspnea x2 days. EXAM: PORTABLE CHEST 1 VIEW COMPARISON:  05/31/2018 FINDINGS: Normal heart size. Mild aortic atherosclerosis without aneurysm rib port catheter tip terminates in the distal SVC. Mild diffuse chronic interstitial prominence is identified without alveolar consolidation, CHF, effusion or pneumothorax. Diffuse scattered lytic lucencies are noted about the bony thorax, humeri and both shoulders compatible with history of multiple myeloma. IMPRESSION: Chronic coarsened interstitial lung markings without alveolar consolidation or CHF. Aortic atherosclerosis. Redemonstration of lytic lucencies about the bony thorax and shoulders compatible with  history of multiple myeloma. Electronically Signed   By: Ashley Royalty M.D.   On: 06/04/2018 13:39    Procedures Procedures (including critical care time)  Medications Ordered in ED Medications  nitroGLYCERIN (NITROSTAT) SL tablet 0.4 mg (0.4 mg Sublingual Given 06/04/18 1340)  iopamidol (ISOVUE-300) 61 % injection 100 mL (has no administration in time range)  aspirin chewable tablet 324 mg (324 mg Oral Given 06/04/18 1322)  fentaNYL (SUBLIMAZE) injection 75 mcg (75 mcg Intravenous Given 06/04/18 1348)  fentaNYL (SUBLIMAZE) injection 75 mcg (75 mcg Intravenous Given 06/04/18 1603)  iopamidol (ISOVUE-370) 76 % injection 75 mL (75 mLs Intravenous Contrast Given 06/04/18 1636)  HYDROmorphone (DILAUDID) injection 1 mg (1 mg Intravenous Given 06/04/18 1847)  ondansetron (ZOFRAN) injection 4 mg (4 mg Intravenous Given 06/04/18 1846)     Initial Impression / Assessment and Plan / ED Course  I have reviewed the triage vital signs and the nursing notes.  Pertinent labs & imaging results that were available during my care of the patient were reviewed by me and considered in my medical decision making (see chart for details).  Clinical Course as of Jun 04 1848  Wed Jun 04, 2018  1311 DG Chest 2 View [JK]  1331 Anemia stable compared to previous values  CBC(!) [JK]  1342 No response to first two doses of NTG   [JK]  1841 Patient continues to have persistent pain despite several doses of IV fentanyl.   [ME]  2683 CT scan does not show any evidence of aneurysm or pulmonary embolism.  Patient has diffuse lytic lesions of her bones including pathologic fractures of her ribs and T11   [JK]  1847 Nursing notes reviewed.  There is a note in the chart about the patient having atrial fibrillation with a rapid rate.  I think this was documented on the wrong patient   [JK]    Clinical Course User Index [JK] Dorie Rank, MD    She presented to the emergency room for evaluation of chest pain.  ED  work-up does not suggest acute coronary ischemia.  Patient did have an elevated d-dimer considering her multiple myeloma CT  angiogram was performed.  No evidence of pulmonary embolism.  Patient does have known multiple myeloma with multiple lytic lesions.  She also has evidence of pathologic fracture in her ribs and thoracic spine.  I think the pain is associated with her malignancy and unfortunately we have not been able to control it despite multiple doses of IV narcotics.  Because of her persistent pain I will consult with medical service for admission to see if we can get her pain under better control.  Final Clinical Impressions(s) / ED Diagnoses   Final diagnoses:  Multiple myeloma, remission status unspecified ([place])  Pathological fracture due to neoplastic disease, unspecified fracture site, initial encounter      Dorie Rank, MD 06/04/18 1849

## 2018-06-04 NOTE — ED Triage Notes (Addendum)
Pt c/o chest pain that radiates to abdomen and SOB x 2 days. Pt currently receiving chemo for multiple myeloma. No cardiac hx per patient. Pt also reports diarrhea x 1 week with bright red bleeding when she wipes that began today.

## 2018-06-04 NOTE — ED Notes (Signed)
ICU nurse refused to take report at this time due to multiple admissions.

## 2018-06-04 NOTE — ED Notes (Signed)
Dr. Thurnell Garbe at bedside, informed about Afib with RVR.

## 2018-06-04 NOTE — ED Notes (Signed)
Patient transported to CT 

## 2018-06-05 ENCOUNTER — Ambulatory Visit (HOSPITAL_COMMUNITY): Payer: Self-pay | Admitting: Psychiatry

## 2018-06-05 DIAGNOSIS — M8450XA Pathological fracture in neoplastic disease, unspecified site, initial encounter for fracture: Secondary | ICD-10-CM | POA: Diagnosis not present

## 2018-06-05 DIAGNOSIS — R0789 Other chest pain: Secondary | ICD-10-CM

## 2018-06-05 DIAGNOSIS — C9000 Multiple myeloma not having achieved remission: Secondary | ICD-10-CM | POA: Diagnosis not present

## 2018-06-05 DIAGNOSIS — R071 Chest pain on breathing: Secondary | ICD-10-CM | POA: Diagnosis not present

## 2018-06-05 LAB — COMPREHENSIVE METABOLIC PANEL
ALK PHOS: 112 U/L (ref 38–126)
ALT: 24 U/L (ref 0–44)
AST: 15 U/L (ref 15–41)
Albumin: 2.9 g/dL — ABNORMAL LOW (ref 3.5–5.0)
Anion gap: 9 (ref 5–15)
BUN: 22 mg/dL (ref 8–23)
CALCIUM: 6.8 mg/dL — AB (ref 8.9–10.3)
CO2: 26 mmol/L (ref 22–32)
CREATININE: 1.49 mg/dL — AB (ref 0.44–1.00)
Chloride: 102 mmol/L (ref 98–111)
GFR, EST AFRICAN AMERICAN: 40 mL/min — AB (ref >60)
GFR, EST NON AFRICAN AMERICAN: 34 mL/min — AB (ref >60)
Glucose, Bld: 119 mg/dL — ABNORMAL HIGH (ref 70–99)
Potassium: 4.4 mmol/L (ref 3.5–5.1)
Sodium: 137 mmol/L (ref 135–145)
TOTAL PROTEIN: 5.9 g/dL — AB (ref 6.5–8.1)
Total Bilirubin: 0.5 mg/dL (ref 0.3–1.2)

## 2018-06-05 LAB — CBC
HCT: 29.8 % — ABNORMAL LOW (ref 36.0–46.0)
Hemoglobin: 9.1 g/dL — ABNORMAL LOW (ref 12.0–15.0)
MCH: 29.7 pg (ref 26.0–34.0)
MCHC: 30.5 g/dL (ref 30.0–36.0)
MCV: 97.4 fL (ref 80.0–100.0)
NRBC: 0 % (ref 0.0–0.2)
PLATELETS: 199 10*3/uL (ref 150–400)
RBC: 3.06 MIL/uL — ABNORMAL LOW (ref 3.87–5.11)
RDW: 18.1 % — AB (ref 11.5–15.5)
WBC: 5.6 10*3/uL (ref 4.0–10.5)

## 2018-06-05 LAB — MAGNESIUM: Magnesium: 1.5 mg/dL — ABNORMAL LOW (ref 1.7–2.4)

## 2018-06-05 LAB — GLUCOSE, CAPILLARY
GLUCOSE-CAPILLARY: 105 mg/dL — AB (ref 70–99)
Glucose-Capillary: 114 mg/dL — ABNORMAL HIGH (ref 70–99)
Glucose-Capillary: 97 mg/dL (ref 70–99)
Glucose-Capillary: 149 mg/dL — ABNORMAL HIGH (ref 70–99)

## 2018-06-05 LAB — TROPONIN I
Troponin I: <0.03 ng/mL (ref <0.03)
Troponin I: <0.03 ng/mL (ref <0.03)

## 2018-06-05 MED ORDER — ZOLPIDEM TARTRATE 5 MG PO TABS
5.0000 mg | ORAL_TABLET | Freq: Every evening | ORAL | Status: DC | PRN
  Administered 2018-06-05: 5 mg via ORAL
  Filled 2018-06-05: qty 1
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
## 2018-06-05 NOTE — Progress Notes (Signed)
Discharge instructions given to patient. Patient verbalizes understanding. IV removed. Patient to be discharged to her home. Taken downstairs via wheelchair.

## 2018-06-05 NOTE — Discharge Summary (Signed)
Physician Discharge Summary  Alexandra Price ZYS:[id number] DOB: 08-11-1946 DOA: 06/04/2018  PCP: [facility] date: 06/04/2018 Discharge date: 06/05/2018  Time spent: 45 minutes  Recommendations for Outpatient Follow-up:  -To be discharged home today. -Advise follow-up with PCP in 2 weeks.  Discharge Diagnoses:  Active Problems:   Chest pain   Discharge Condition: Stable and improved  Filed Weights   06/04/18 1301 06/04/18 2039  Weight: 73.5 kg 71.2 kg    History of present illness:  As per Dr. Maudie Mercury on 10/16: Alexandra Price  is a 72 y.o. female, w hypertension, dm2, ckd stage 3, multiple myeloma,  who presents with c/o chest pain "tightness" for the past 65month, mostly on the left side. Pt denies fever, chills, cough, palp, sob, n/v, diarrhea, brbpr.  Pt had cardiac echo10/04/2018 preserved EF.  Pt requesting reassurance not cardiac.    In Ed,  T 98.6, P 73  Bp 155/79  Pox 96% on RA  CTA chest  IMPRESSION: 1. Negative for acute pulmonary embolus. 2. Diffuse lytic lesions throughout the visible skeleton in keeping with advanced Multiple Myeloma. Mild pathologic fractures of left posterolateral ribs and the T11 vertebral body. 3. Mild cardiomegaly. No significant pulmonary abnormality.  Na 133, K 4.2, Bun 24 Creatinine 1.41 Alb 3.1  Ast 23, Alt 29, alk phos 135 T. Bili 0.6  Wbc 4.7, hgb 10.1, Plt 225  Trop <0.03  Pt will be admitted for chest pain      Hospital Course:   Chest pain -Has ruled out for ACS with EKG without acute ischemic changes and 5- troponins. -CT angiogram of the chest was negative for PE but she does have significant bony lytic lesions from her advanced multiple myeloma. -Suspect these lytic lesions are the cause of her chest discomfort. -Do not recommend further cardiac work-up at this time, cardiology has been consulted by admitting physician  and agrees.  Rest of chronic conditions have been  stable during this short hospitalization.  Procedures:  None  Consultations:  Cardiology  Discharge Instructions  Discharge Instructions    Diet - low sodium heart healthy   Complete by:  As directed    Increase activity slowly   Complete by:  As directed      Allergies as of 06/05/2018      Reactions   Ciprofloxacin Anaphylaxis   Morphine And Related Nausea And Vomiting      Medication List    TAKE these medications   acyclovir 400 MG tablet Commonly known as:  ZOVIRAX Take 1 tablet (400 mg total) by mouth 2 (two) times daily.   allopurinol 300 MG tablet Commonly known as:  ZYLOPRIM Take 1 tablet (300 mg total) by mouth daily.   aspirin EC 81 MG tablet Take 81 mg by mouth daily.   CALCIUM 1000 + D PO Take 1,000 mg by mouth daily. Take one tablet daily until next visit with oncologist.   ciprofloxacin 500 MG tablet Commonly known as:  CIPRO Take 500 mg by mouth 2 (two) times daily.   citalopram 20 MG tablet Commonly known as:  CELEXA Take 1 tablet (20 mg total) by mouth daily.   famotidine 20 MG tablet Commonly known as:  PEPCID Take 1 tablet (20 mg total) by mouth 2 (two) times daily.   feeding supplement (ENSURE ENLIVE) Liqd Take 237 mLs by mouth 2 (two) times daily between meals for 14 days.   HYDROcodone-acetaminophen 5-325 MG tablet Commonly known as:  NORCO/VICODIN Take 1 tablet by mouth 2 (two) times daily.   hydrocortisone cream 1 % Apply topically 2 (two) times daily.   hydrOXYzine 25 MG tablet Commonly known as:  ATARAX/VISTARIL Take 1 tablet (25 mg total) by mouth every 6 (six) hours as needed for itching (rash).   lidocaine-prilocaine cream Commonly known as:  EMLA Apply to affected area once   lisinopril 10 MG tablet Commonly known as:  PRINIVIL,ZESTRIL Take 1 tablet (10 mg total) by mouth daily.   metFORMIN 500 MG tablet Commonly known as:  GLUCOPHAGE Take 1 tablet (500 mg total) by mouth 2 (two) times daily with a meal.     ondansetron 4 MG tablet Commonly known as:  ZOFRAN Take 1 tablet (4 mg total) by mouth every 8 (eight) hours as needed for nausea or vomiting.   pantoprazole 40 MG tablet Commonly known as:  PROTONIX Take 1 tablet (40 mg total) by mouth daily.   polyethylene glycol packet Commonly known as:  MIRALAX / GLYCOLAX Take 17 g by mouth daily.   predniSONE 50 MG tablet Commonly known as:  DELTASONE Take 1 tablet (50 mg total) by mouth daily with breakfast.   prochlorperazine 10 MG tablet Commonly known as:  COMPAZINE Take 1 tablet (10 mg total) by mouth every 6 (six) hours as needed (Nausea or vomiting).   trolamine salicylate 10 % cream Commonly known as:  ASPERCREME Apply 1 application topically daily as needed for muscle pain.      Allergies  Allergen Reactions  . Ciprofloxacin Anaphylaxis  . Morphine And Related Nausea And Vomiting      The results of significant diagnostics from this hospitalization (including imaging, microbiology, ancillary and laboratory) are listed below for reference.    Significant Diagnostic Studies: Ct Abdomen Pelvis Wo Contrast  Result Date: 05/20/2018 CLINICAL DATA:  Right lower quadrant pain for 4 days, constipation, some nausea, history of multiple myeloma EXAM: CT ABDOMEN AND PELVIS WITHOUT CONTRAST TECHNIQUE: Multidetector CT imaging of the abdomen and pelvis was performed following the standard protocol without IV contrast. COMPARISON:  CT abdomen pelvis of 05/11/2017 FINDINGS: Lower chest: Linear scarring or atelectasis is again noted medially at the left lung base. No pneumonia or pleural effusion is seen. Heart size is stable. Hepatobiliary: No focal liver abnormality is seen. No gallstones, gallbladder wall thickening, or biliary dilatation. The liver is unremarkable in the unenhanced state. No calcified gallstones are seen. Pancreas: The pancreas is normal in size and the pancreatic duct is not dilated. Spleen: The spleen is unremarkable.  There is a small splenic artery calcification near the splenic hilus. Adrenals/Urinary Tract: The adrenal glands appear normal. No renal calculi are seen and there is no evidence of hydronephrosis. The ureters are normal in caliber. The urinary bladder is not well distended but no abnormality is seen. Stomach/Bowel: The stomach is largely decompressed. No small bowel abnormality is seen. No abnormality of the colon is seen other than possible edema at the base of the cecum. The appendix appears thickened and fluid filled extending caudally and anteriorly toward the right inguinal canal region. The appendix measures up to 13 mm in diameter. There is some strandiness of the surrounding soft tissue planes but no evidence of rupture or abscess formation is seen. Vascular/Lymphatic: Abdominal aorta is normal in caliber with mild abdominal aortic atherosclerosis for age. No adenopathy is seen with only small retroperitoneal nodes and mesenteric nodes present. Reproductive: The uterus has previously been resected. No adnexal lesion is seen. A tiny amount  of fluid layers in the pelvis. Other: No abdominal wall hernia is seen Musculoskeletal: Diffuse bone lesions are present in this patient with known multiple myeloma with partial compression of anterior superior aspect of L4 and T11 again noted. IMPRESSION: 1. Dilated edematous and somewhat rigid appendix measuring up to 13 mm in diameter with some surrounding strandiness of the fat planes indicative of acute appendicitis. No complicating features are seen. 2. Diffuse bone lesions consistent with known multiple myeloma. Electronically Signed   By: Ivar Drape M.D.   On: 05/20/2018 13:17   Ct Abdomen Pelvis Wo Contrast  Result Date: 05/11/2018 CLINICAL DATA:  73 year old female with abdominal pain. History of multiple myeloma. EXAM: CT ABDOMEN AND PELVIS WITHOUT CONTRAST TECHNIQUE: Multidetector CT imaging of the abdomen and pelvis was performed following the standard  protocol without IV contrast. COMPARISON:  Skeletal survey radiograph dated 04/07/2018 FINDINGS: Evaluation of this exam is limited in the absence of intravenous contrast. Lower chest: Left lung base linear atelectasis/scarring. The visualized lung bases are otherwise clear. There is hypoattenuation of the cardiac blood pool suggestive of a degree of anemia. Clinical correlation is recommended. The tip of Port-A-Cath is partially visualized in the region of the cavoatrial junction. No intra-abdominal free air or free fluid. Hepatobiliary: No focal liver abnormality is seen. No gallstones, gallbladder wall thickening, or biliary dilatation. Pancreas: Punctate focus of calcification in the body of the pancreas may be related to vascular calcification or sequela of prior inflammation. The pancreas is otherwise unremarkable. Spleen: Normal in size without focal abnormality. Adrenals/Urinary Tract: Adrenal glands are unremarkable. Kidneys are normal, without renal calculi, focal lesion, or hydronephrosis. Bladder is unremarkable. Stomach/Bowel: There is loose stool within the colon compatible with diarrheal state. There is no bowel obstruction or active inflammation. Normal appendix. Vascular/Lymphatic: Mild aortoiliac atherosclerotic disease. The abdominal aorta and IVC are otherwise grossly unremarkable on this noncontrast CT. No portal venous gas. There is no adenopathy. Reproductive: Hysterectomy.  No pelvic mass. Other: None Musculoskeletal: Osteopenia. Innumerable lytic osseous lesions consistent with known multiple myeloma. There is compression fracture of the superior endplate of L4 with approximately 40% loss of vertebral body height centrally similar or progressed since the prior radiograph. There is compression fracture of the T11 with approximately 50% loss of vertebral body height and mild anterior wedging similar to prior radiograph. There are multiple old appearing left posterior rib fractures. No definite  acute fracture. Evaluation for acute fractures however limited due to advanced osteopenia and extensive lytic lucencies. IMPRESSION: 1. Diarrheal state. Correlation with clinical exam and stool cultures recommended. No bowel obstruction. Normal appendix. 2. Innumerable osseous lytic lesions in keeping with known multiple myeloma. Known T11 and L4 compression fractures as well as multiple old-appearing left posterior rib fractures. No definite acute fracture. Electronically Signed   By: Anner Crete M.D.   On: 05/11/2018 23:00   Dg Chest 1 View  Result Date: 05/22/2018 CLINICAL DATA:  Abdominal pain and fever in a patient with a history of multiple myeloma. EXAM: CHEST  1 VIEW COMPARISON:  PA and lateral chest 05/20/2018 and 05/11/2018. FINDINGS: Lung volumes are low but the lungs are clear. Heart size is normal. No pneumothorax or pleural effusion. Port-A-Cath is in place. Multiple lytic bony lesions consistent with multiple myeloma noted. IMPRESSION: No acute disease. Lytic bone lesions consistent with multiple myeloma. Electronically Signed   By: Inge Rise M.D.   On: 05/22/2018 11:17   Dg Chest 2 View  Result Date: 05/31/2018 CLINICAL DATA:  Shortness  of breath. EXAM: CHEST - 2 VIEW COMPARISON:  Chest x-ray dated May 27, 2018. FINDINGS: Unchanged right chest wall port catheter. The heart size and mediastinal contours are within normal limits. Normal pulmonary vascularity. No focal consolidation, pleural effusion, or pneumothorax. No acute osseous abnormality. Unchanged lower thoracic compression fracture. Lytic lesions involving both clavicles, scapula, and proximal humeri are similar to prior study. IMPRESSION: 1.  No active cardiopulmonary disease. 2. Sequela of multiple myeloma, unchanged. Electronically Signed   By: Titus Dubin M.D.   On: 05/31/2018 17:22   Dg Chest 2 View  Result Date: 05/27/2018 CLINICAL DATA:  Multiple myeloma.  Dysphagia. EXAM: CHEST - 2 VIEW COMPARISON:   May 26, 2018 FINDINGS: Port-A-Cath tip is in the superior vena cava. No pneumothorax. No edema or consolidation. Heart size and pulmonary vascularity are normal. No adenopathy. There is an expansile lesion along the superior, medial left scapula. There is widespread lytic bony abnormality consistent with known multiple myeloma. IMPRESSION: Extensive bony changes of multiple myeloma. No edema or consolidation. No evident adenopathy. Electronically Signed   By: Lowella Grip III M.D.   On: 05/27/2018 15:40   Dg Chest 2 View  Result Date: 05/24/2018 CLINICAL DATA:  Shortness of breath, left chest pain. History of myeloma. EXAM: CHEST - 2 VIEW COMPARISON:  05/23/2018 FINDINGS: Right Port-A-Cath in place with the tip in the SVC, unchanged. Heart is normal size. No confluent airspace opacities or effusions. Diffuse lytic lesions throughout the bony skeleton compatible with myeloma. Multiple healing bilateral rib fractures. IMPRESSION: No acute cardiopulmonary disease. Diffuse lytic lesions throughout the bones compatible with myeloma with healing bilateral rib fractures Electronically Signed   By: Rolm Baptise M.D.   On: 05/24/2018 14:39   Dg Chest 2 View  Result Date: 05/20/2018 CLINICAL DATA:  Cough, dizziness, and chest pain. History of multiple myeloma. Nonsmoker. EXAM: CHEST - 2 VIEW COMPARISON:  PA and lateral chest x-ray of May 11, 2018 FINDINGS: The lungs are adequately inflated. The interstitial markings are coarse though stable. Confluent density projecting over the posterior aspect of the right seventh rib is consistent with a myomatous rib lesion and is stable. The heart and pulmonary vascularity are normal. There is faint calcification in the wall of the aortic arch. The power port catheter tip projects over the midportion of the SVC. There is chronic partial compression of the body of T11 or T12. There are innumerable permeative lesions within the humeri, clavicles, and scapulae. There  is deformity of the lateral aspect of the right fourth rib consistent with a previous fracture. IMPRESSION: Chronic bronchitic changes. No alveolar pneumonia nor pulmonary edema. Extensive myomatous involvement of the visualized bony structures. Thoracic aortic atherosclerosis. Electronically Signed   By: David  Martinique M.D.   On: 05/20/2018 11:36   Dg Chest 2 View  Result Date: 05/11/2018 CLINICAL DATA:  Abdominal pain EXAM: CHEST - 2 VIEW COMPARISON:  04/27/2018 FINDINGS: Right Port-A-Cath remains in place, unchanged. Heart is normal size. No confluent airspace opacities or effusions. Numerous lucencies throughout the visualized skeletal structures compatible with diffuse widespread myeloma. IMPRESSION: No acute cardiopulmonary disease. Electronically Signed   By: Rolm Baptise M.D.   On: 05/11/2018 22:57   Ct Angio Chest Pe W And/or Wo Contrast  Result Date: 06/04/2018 CLINICAL DATA:  This study identified as missing a report due to unplanned IT downtime at 6:23 pm on 06/04/2018. 72 year old female with chest pain and shortness of breath for 2 days. Multiple myeloma. EXAM: CT ANGIOGRAPHY CHEST WITH CONTRAST  TECHNIQUE: Multidetector CT imaging of the chest was performed using the standard protocol during bolus administration of intravenous contrast. Multiplanar CT image reconstructions and MIPs were obtained to evaluate the vascular anatomy. CONTRAST:  66m ISOVUE-370 IOPAMIDOL (ISOVUE-370) INJECTION 76% COMPARISON:  CT Abdomen and Pelvis 05/20/2018. FINDINGS: Cardiovascular: Good contrast bolus timing in the pulmonary arterial tree. No focal filling defect identified in the pulmonary arteries to suggest acute pulmonary embolism. Mild cardiomegaly. No pericardial effusion. Mild Calcified aortic atherosclerosis. Otherwise negative visible aorta. Mediastinum/Nodes: Negative.  No lymphadenopathy. Lungs/Pleura: Major airways are patent. There is curvilinear scarring or atelectasis in the medial basal segment of  the left lower lobe. No pleural effusion or other abnormal pulmonary opacity. Upper Abdomen: Negative visible liver, gallbladder, spleen, pancreas, adrenal glands, kidneys, and bowel in the upper abdomen. Musculoskeletal: Diffusely abnormal visible skeletal structures with innumerable lytic lesions. Expansile lesion of the distal left clavicle. Pathologic fracture of the T11 vertebral body. Mild bony retropulsion without significant spinal stenosis. Mild expansion of the manubrium. Multilevel mildly displaced probably pathologic fractures of the posterolateral left ribs. Review of the MIP images confirms the above findings. IMPRESSION: 1.  Negative for acute pulmonary embolus. 2. Diffuse lytic lesions throughout the visible skeleton in keeping with advanced Multiple Myeloma. Mild pathologic fractures of left posterolateral ribs and the T11 vertebral body. 3. Mild cardiomegaly.  No significant pulmonary abnormality. Electronically Signed   By: HGenevie AnnM.D.   On: 06/04/2018 18:28   Nm Pulmonary Perf And Vent  Result Date: 05/23/2018 CLINICAL DATA:  Three days post appendectomy, chest pain, high pretest clinical probability for pulmonary embolism EXAM: NUCLEAR MEDICINE VENTILATION - PERFUSION LUNG SCAN TECHNIQUE: Ventilation images were obtained in multiple projections using inhaled aerosol Tc-936mTPA. Perfusion images were obtained in multiple projections after intravenous injection of Tc-9916mA. RADIOPHARMACEUTICALS:  32 mCi of Tc-55m24mA aerosol inhalation and 4.4 mCi Tc55m-36mIV COMPARISON:  None Correlation: Chest radiograph 05/23/2018 FINDINGS: Ventilation: Normal Perfusion: Diminished perfusion at the posterior lung bases bilaterally. Observed pattern suggests presence of small bibasilar pleural effusions or minimal atelectasis. Portable chest radiograph demonstrates subsegmental atelectasis at LEFT base but no definite pleural effusions. No other perfusion defects identified. IMPRESSION: Low probability  for pulmonary embolism. Electronically Signed   By: Mark Lavonia Dana   On: 05/23/2018 12:26   Us VeKoreaus Img Lower Bilateral  Result Date: 05/23/2018 CLINICAL DATA:  Bilateral lower extremity pain and edema. Elevated D-dimer. History of multiple myeloma. Evaluate for DVT EXAM: BILATERAL LOWER EXTREMITY VENOUS DOPPLER ULTRASOUND TECHNIQUE: Gray-scale sonography with graded compression, as well as color Doppler and duplex ultrasound were performed to evaluate the lower extremity deep venous systems from the level of the common femoral vein and including the common femoral, femoral, profunda femoral, popliteal and calf veins including the posterior tibial, peroneal and gastrocnemius veins when visible. The superficial great saphenous vein was also interrogated. Spectral Doppler was utilized to evaluate flow at rest and with distal augmentation maneuvers in the common femoral, femoral and popliteal veins. COMPARISON:  None. FINDINGS: RIGHT LOWER EXTREMITY Common Femoral Vein: No evidence of thrombus. Normal compressibility, respiratory phasicity and response to augmentation. Saphenofemoral Junction: No evidence of thrombus. Normal compressibility and flow on color Doppler imaging. Profunda Femoral Vein: No evidence of thrombus. Normal compressibility and flow on color Doppler imaging. Femoral Vein: No evidence of thrombus. Normal compressibility, respiratory phasicity and response to augmentation. Popliteal Vein: No evidence of thrombus. Normal compressibility, respiratory phasicity and response to augmentation. Calf Veins: No evidence of thrombus. Normal  compressibility and flow on color Doppler imaging. Superficial Great Saphenous Vein: No evidence of thrombus. Normal compressibility. Venous Reflux:  None. Other Findings:  None. LEFT LOWER EXTREMITY Common Femoral Vein: No evidence of thrombus. Normal compressibility, respiratory phasicity and response to augmentation. Saphenofemoral Junction: No evidence of  thrombus. Normal compressibility and flow on color Doppler imaging. Profunda Femoral Vein: No evidence of thrombus. Normal compressibility and flow on color Doppler imaging. Femoral Vein: No evidence of thrombus. Normal compressibility, respiratory phasicity and response to augmentation. Popliteal Vein: No evidence of thrombus. Normal compressibility, respiratory phasicity and response to augmentation. Calf Veins: No evidence of thrombus. Normal compressibility and flow on color Doppler imaging. Superficial Great Saphenous Vein: No evidence of thrombus. Normal compressibility. Venous Reflux:  None. Other Findings:  None. IMPRESSION: No evidence of DVT within either lower extremity. Electronically Signed   By: Sandi Mariscal M.D.   On: 05/23/2018 12:43   Dg Chest Portable 1 View  Result Date: 06/04/2018 CLINICAL DATA:  Chest pain radiating to the abdomen. Dyspnea x2 days. EXAM: PORTABLE CHEST 1 VIEW COMPARISON:  05/31/2018 FINDINGS: Normal heart size. Mild aortic atherosclerosis without aneurysm rib port catheter tip terminates in the distal SVC. Mild diffuse chronic interstitial prominence is identified without alveolar consolidation, CHF, effusion or pneumothorax. Diffuse scattered lytic lucencies are noted about the bony thorax, humeri and both shoulders compatible with history of multiple myeloma. IMPRESSION: Chronic coarsened interstitial lung markings without alveolar consolidation or CHF. Aortic atherosclerosis. Redemonstration of lytic lucencies about the bony thorax and shoulders compatible with history of multiple myeloma. Electronically Signed   By: Ashley Royalty M.D.   On: 06/04/2018 13:39   Dg Chest Port 1 View  Result Date: 05/23/2018 CLINICAL DATA:  Multiple myeloma.  Unexplained fever. EXAM: PORTABLE CHEST 1 VIEW COMPARISON:  05/22/2018.  05/20/2018.  05/11/2018. FINDINGS: Power port remains in place. Heart size is normal. Chronic interstitial lung markings are again evident. There is mild patchy  atelectasis and or pneumonia at the left lung base. The remainder the chest is clear. Multiple lytic skeletal lesions again demonstrated. IMPRESSION: New small area of atelectasis or pneumonia in the left lower lobe. Chronic interstitial lung markings elsewhere. Chronic lytic bone lesions. Electronically Signed   By: Nelson Chimes M.D.   On: 05/23/2018 07:37   Dg Abd Acute W/chest  Result Date: 05/26/2018 CLINICAL DATA:  Loose stools.  History of multiple myeloma. EXAM: DG ABDOMEN ACUTE W/ 1V CHEST COMPARISON:  Chest radiograph, 05/24/2018. Abdomen and pelvis CT, 05/20/2018. FINDINGS: Normal bowel gas pattern. Specifically, no evidence of obstruction, generalized adynamic ileus or free air. No evidence of renal or ureteral stones. Skin staples are noted overlying the central abdomen and lower pelvis. Soft tissues are otherwise unremarkable. Cardiac silhouette is normal in size. No mediastinal or hilar masses. Lungs show prominent bronchovascular and interstitial markings, but no evidence of pneumonia or pulmonary edema and no change. Right anterior chest wall Port-A-Cath is stable. Skeletal structures are demineralized with numerous well-defined lytic lesions consistent with the given history of multiple myeloma. IMPRESSION: 1. No acute findings. No evidence of bowel obstruction, generalized adynamic ileus or free air. 2. No acute cardiopulmonary disease. Electronically Signed   By: Lajean Manes M.D.   On: 05/26/2018 15:06    Microbiology: Recent Results (from the past 240 hour(s))  Culture, blood (routine x 2)     Status: None   Collection Time: 05/27/18  2:57 PM  Result Value Ref Range Status   Specimen Description BLOOD RIGHT ANTECUBITAL  Final   Special Requests   Final    BOTTLES DRAWN AEROBIC ONLY Blood Culture adequate volume   Culture   Final    NO GROWTH 5 DAYS Performed at [facility], [address], [place], [place] [address]    Report Status 06/01/2018 FINAL  Final  Culture, blood  (routine x 2)     Status: None   Collection Time: 05/27/18  2:59 PM  Result Value Ref Range Status   Specimen Description BLOOD BLOOD RIGHT WRIST  Final   Special Requests   Final    BOTTLES DRAWN AEROBIC AND ANAEROBIC Blood Culture adequate volume   Culture   Final    NO GROWTH 5 DAYS Performed at [facility], [address], [place], [place] [address]    Report Status 06/01/2018 FINAL  Final  Urine culture     Status: None   Collection Time: 05/27/18  4:45 PM  Result Value Ref Range Status   Specimen Description   Final    URINE, CLEAN CATCH Performed at [facility], [address], [place], [place] [address]    Special Requests   Final    NONE Performed at [facility], [address] [place], [place] [address]    Culture   Final    NO GROWTH Performed at [facility], [place] [address], [place], [place] [address]    Report Status 05/29/2018 FINAL  Final     Labs: Basic Metabolic Panel: Recent Labs  Lab 05/31/18 1621 06/04/18 1315 06/05/18 0628 06/05/18 0921  NA 131* 133* 137  --   K 4.5 4.2 4.4  --   CL 98 99 102  --   CO2 _0 --   GLUCOSE 337* 254* 119*  --   BUN 29* 24* 22  --   CREATININE 1.64* 1.41* 1.49*  --   CALCIUM 7.6* 6.9* 6.8*  --   MG  --   --   --  1.5*   Liver Function Tests: Recent Labs  Lab 05/31/18 1621 06/04/18 1315 06/05/18 0628  AST _1 ALT _2 ALKPHOS 149* 135* 112  BILITOT 0.6 0.6 0.5  PROT 5.6* 6.1* 5.9*  ALBUMIN 2.6* 3.1* 2.9*   No results for input(s): LIPASE, AMYLASE in the last 168 hours. No results for input(s): AMMONIA in the last 168 hours. CBC: Recent Labs  Lab 05/31/18 1621 06/04/18 1315 06/05/18 0628  WBC 10.4 4.7 5.6  NEUTROABS 3.2  --   --   HGB 9.1* 10.1* 9.1*  HCT 28.8* 32.1* 29.8*  MCV 94.4 95.0 97.4  PLT 274 225 199   Cardiac Enzymes: Recent Labs  Lab 06/04/18 1315 06/04/18 1659 06/04/18 1909 06/05/18 0052 06/05/18 0628  TROPONINI <0.03 <0.03 <0.03 <0.03 <0.03    BNP: BNP (last 3 results) No results for input(s): BNP in the last 8760 hours.  ProBNP (last 3 results) No results for input(s): PROBNP in the last 8760 hours.  CBG: Recent Labs  Lab 06/04/18 1337 06/04/18 1857 06/04/18 2321 06/05/18 0345 06/05/18 0806  GLUCAP 234* 208* 105* 114* 97       Signed:  [facility] Pager: [phone] 06/05/2018, 10:00 AM

## 2018-06-06 ENCOUNTER — Other Ambulatory Visit (HOSPITAL_COMMUNITY): Payer: Self-pay | Admitting: Nurse Practitioner

## 2018-06-06 DIAGNOSIS — C9000 Multiple myeloma not having achieved remission: Secondary | ICD-10-CM

## 2018-06-06 MED ORDER — HYDROCODONE-ACETAMINOPHEN 5-325 MG PO TABS: 1.0000 | ORAL_TABLET | Freq: Three times a day (TID) | ORAL | 0 refills | Status: DC | PRN

## 2018-06-07 ENCOUNTER — Other Ambulatory Visit: Payer: Self-pay

## 2018-06-07 ENCOUNTER — Encounter (HOSPITAL_COMMUNITY): Payer: Self-pay | Admitting: Emergency Medicine

## 2018-06-07 ENCOUNTER — Emergency Department (HOSPITAL_COMMUNITY)
Admission: EM | Admit: 2018-06-07 | Discharge: 2018-06-07 | Disposition: A | Discharge status: Home / Self Care | Payer: Medicaid Other | Attending: Emergency Medicine | Admitting: Emergency Medicine

## 2018-06-07 DIAGNOSIS — N184 Chronic kidney disease, stage 4 (severe): Secondary | ICD-10-CM | POA: Diagnosis not present

## 2018-06-07 DIAGNOSIS — I129 Hypertensive chronic kidney disease with stage 1 through stage 4 chronic kidney disease, or unspecified chronic kidney disease: Secondary | ICD-10-CM | POA: Diagnosis not present

## 2018-06-07 DIAGNOSIS — Z7982 Long term (current) use of aspirin: Secondary | ICD-10-CM | POA: Insufficient documentation

## 2018-06-07 DIAGNOSIS — Z79899 Other long term (current) drug therapy: Secondary | ICD-10-CM | POA: Insufficient documentation

## 2018-06-07 DIAGNOSIS — R739 Hyperglycemia, unspecified: Secondary | ICD-10-CM | POA: Insufficient documentation

## 2018-06-07 LAB — CBG MONITORING, ED: Glucose-Capillary: 213 mg/dL — ABNORMAL HIGH (ref 70–99)

## 2018-06-07 MED ORDER — SODIUM CHLORIDE 0.9 % IV BOLUS: 1000.0000 mL | Freq: Once | INTRAVENOUS | Status: DC

## 2018-06-07 NOTE — ED Triage Notes (Signed)
Patient blood sugar reading at home 400 mg/dl, currently on prednisone.

## 2018-06-07 NOTE — Discharge Instructions (Addendum)
Drink plenty of fluids. Finish your prednisone. Continue your metformin until your blood sugars are in the 100's then stop, but continue to monitor your blood sugar closely for several days after you stop the metformin.

## 2018-06-07 NOTE — ED Provider Notes (Signed)
[facility] EMERGENCY DEPARTMENT Provider Note   CSN: [id number] Arrival date & time: 06/07/18  0559  Time seen 06:15 AM   History   Chief Complaint Chief Complaint  Patient presents with  . Hyperglycemia    HPI Alexandra Price is a 72 y.o. female.  HPI patient states she was started on prednisone about a week ago and she has 2 more days of pills to take.  She was seen on Tuesday, October 15 by her surgeon and he increased her dose of prednisone because of continuing rash.  She states she was recently in the hospital and had some type of allergic reaction to "antibiotics".  She states she has never had diabetes.  However she has been borderline and when they started her on the steroids in the hospital he also started her on metformin twice a day.  They estimate that she will probably be able to come off of it when she stops the prednisone.  She has been checking her blood sugars frequently.  She states last night about 9 PM her blood sugar started to increase.  At 9 PM it was 329, 30 minutes later was 379, and then it came down to 309 and was at that level for many hours.  At 530 this morning it was 279 and when she called the hospital nurse they told her to come to the ED.  She states her blood sugars are usually in the 130 range.  She states she has been drinking a lot of water and she is been having some polyuria in the past couple of days.  PCP [facility]   Past Medical History:  Diagnosis Date  . Anxiety   . Depression   . Hypertension   . Multiple myeloma ([place])    multiple myeloma    Patient Active Problem List   Diagnosis Date Noted  . Chest pain 06/04/2018  . Depression 05/29/2018  . Hyperglycemia 05/28/2018  . HTN (hypertension) 05/28/2018  . Anxiety 05/28/2018  . Rash 05/27/2018  . Pseudomonas infection 05/26/2018  . SOB (shortness of breath) 05/24/2018  . CAP (community acquired pneumonia) 05/23/2018  . Fever 05/22/2018  . Sinus  tachycardia 05/22/2018  . CKD (chronic kidney disease) stage 4, GFR 15-29 ml/min (HCC) 05/22/2018  . S/P laparoscopic appendectomy 05/20/2018  . Multiple myeloma without remission ([place]) 04/15/2018  . Goals of care, counseling/discussion 04/15/2018  . Multiple myeloma not having achieved remission ([place]) 04/07/2018    Past Surgical History:  Procedure Laterality Date  . ABDOMINAL HYSTERECTOMY     total  . APPENDECTOMY    . LAPAROSCOPIC APPENDECTOMY N/A 05/20/2018   Procedure: APPENDECTOMY LAPAROSCOPIC;  Surgeon: [name], MD;  Location: [facility];  Service: General;  Laterality: N/A;  . PORTACATH PLACEMENT Right 04/14/2018   Procedure: INSERTION PORT-A-CATH;  Surgeon: [name], MD;  Location: [facility];  Service: General;  Laterality: Right;     OB History   None      Home Medications    Prior to Admission medications   Medication Sig Start Date End Date Taking? Authorizing Provider  acyclovir (ZOVIRAX) 400 MG tablet Take 1 tablet (400 mg total) by mouth 2 (two) times daily. 05/23/18   Johnson, Clanford L, MD  allopurinol (ZYLOPRIM) 300 MG tablet Take 1 tablet (300 mg total) by mouth daily. 04/24/18   [name], MD  aspirin EC 81 MG tablet Take 81 mg by mouth daily.    [provider]  Calcium  Carb-Cholecalciferol (CALCIUM 1000 + D PO) Take 1,000 mg by mouth daily. Take one tablet daily until next visit with oncologist.    [provider]  ciprofloxacin (CIPRO) 500 MG tablet Take 500 mg by mouth 2 (two) times daily.    [provider]  citalopram (CELEXA) 20 MG tablet Take 1 tablet (20 mg total) by mouth daily. 05/30/18   Kathie Dike, MD  famotidine (PEPCID) 20 MG tablet Take 1 tablet (20 mg total) by mouth 2 (two) times daily. 05/31/18   Milton Ferguson, MD  feeding supplement, ENSURE ENLIVE, (ENSURE ENLIVE) LIQD Take 237 mLs by mouth 2 (two) times daily between meals for 14 days. 05/24/18 06/07/18  Johnson, Clanford L, MD    HYDROcodone-acetaminophen (NORCO) 5-325 MG tablet Take 1 tablet by mouth 3 (three) times daily as needed for moderate pain. 06/06/18   Lockamy, Randi L, NP-C  hydrocortisone cream 1 % Apply topically 2 (two) times daily. 05/29/18   Kathie Dike, MD  hydrOXYzine (ATARAX/VISTARIL) 25 MG tablet Take 1 tablet (25 mg total) by mouth every 6 (six) hours as needed for itching (rash). 05/31/18   Milton Ferguson, MD  lidocaine-prilocaine (EMLA) cream Apply to affected area once 04/15/18   [name], MD  lisinopril (PRINIVIL,ZESTRIL) 10 MG tablet Take 1 tablet (10 mg total) by mouth daily. 04/10/18   Nat Christen, MD  metFORMIN (GLUCOPHAGE) 500 MG tablet Take 1 tablet (500 mg total) by mouth 2 (two) times daily with a meal. 05/29/18 05/29/19  Kathie Dike, MD  ondansetron (ZOFRAN) 4 MG tablet Take 1 tablet (4 mg total) by mouth every 8 (eight) hours as needed for nausea or vomiting. 04/22/18   Lockamy, Randi L, NP-C  pantoprazole (PROTONIX) 40 MG tablet Take 1 tablet (40 mg total) by mouth daily. 05/09/18   Lockamy, Randi L, NP-C  polyethylene glycol (MIRALAX / GLYCOLAX) packet Take 17 g by mouth daily. 05/23/18 06/22/18  Johnson, Clanford L, MD  predniSONE (DELTASONE) 50 MG tablet Take 1 tablet (50 mg total) by mouth daily with breakfast. 05/30/18   Kathie Dike, MD  prochlorperazine (COMPAZINE) 10 MG tablet Take 1 tablet (10 mg total) by mouth every 6 (six) hours as needed (Nausea or vomiting). 04/15/18   [name], MD  trolamine salicylate (ASPERCREME) 10 % cream Apply 1 application topically daily as needed for muscle pain.    [provider]    Family History Family History  Problem Relation Age of Onset  . Heart disease Mother   . Emphysema Father   . Diabetes Sister   . Cancer Brother        liver, lung, and colon  . Diabetes Brother     Social History Social History   Tobacco Use  . Smoking status: Never Smoker  . Smokeless tobacco: Never Used  Substance  Use Topics  . Alcohol use: Never    Frequency: Never  . Drug use: Not Currently  lives at home Lives with spouse   Allergies   Ciprofloxacin and Morphine and related   Review of Systems Review of Systems  All other systems reviewed and are negative.    Physical Exam Updated Vital Signs BP (!) 151/74 (BP Location: Left Arm)   Pulse 85   Temp 97.9 F (36.6 C) (Oral)   Resp 14   Ht _0  (1.676 m)   Wt 71.2 kg   SpO2 97%   BMI 25.34 kg/m   Vital signs normal except for hypertension   Physical Exam  Constitutional:  She is oriented to person, place, and time. She appears well-developed and well-nourished.  Non-toxic appearance. She does not appear ill. No distress.  HENT:  Head: Normocephalic and atraumatic.  Right Ear: External ear normal.  Left Ear: External ear normal.  Nose: Nose normal. No mucosal edema or rhinorrhea.  Mouth/Throat: Oropharynx is clear and moist and mucous membranes are normal. No dental abscesses or uvula swelling.  Eyes: Pupils are equal, round, and reactive to light. Conjunctivae and EOM are normal.  Neck: Normal range of motion and full passive range of motion without pain. Neck supple.  Cardiovascular: Normal rate, regular rhythm and normal heart sounds. Exam reveals no gallop and no friction rub.  No murmur heard. Pulmonary/Chest: Effort normal and breath sounds normal. No respiratory distress. She has no wheezes. She has no rhonchi. She has no rales. She exhibits no tenderness and no crepitus.  Abdominal: Normal appearance.  Musculoskeletal: Normal range of motion. She exhibits no edema or tenderness.  Moves all extremities well.   Neurological: She is alert and oriented to person, place, and time. She has normal strength. No cranial nerve deficit.  Skin: Skin is warm, dry and intact. No rash noted. No erythema. No pallor.  Psychiatric: She has a normal mood and affect. Her speech is normal and behavior is normal. Her mood appears not  anxious.  Nursing note and vitals reviewed.    ED Treatments / Results  Labs (all labs ordered are listed, but only abnormal results are displayed) Results for orders placed or performed during the hospital encounter of 06/07/18  POC CBG, ED  Result Value Ref Range   Glucose-Capillary 213 (H) 70 - 99 mg/dL   Laboratory interpretation all normal except hyperglycemia    EKG None  Radiology No results found.  Procedures Procedures (including critical care time)  Medications Ordered in ED Medications  sodium chloride 0.9 % bolus 1,000 mL (has no administration in time range)     Initial Impression / Assessment and Plan / ED Course  I have reviewed the triage vital signs and the nursing notes.  Pertinent labs & imaging results that were available during my care of the patient were reviewed by me and considered in my medical decision making (see chart for details).     Patient was given a liter of normal saline.  We discussed she should continue her metformin and monitoring her blood sugar.  She states she only has 2 more days of the prednisone.  Patient has a Port-A-Cath and I did not feel her blood sugar was high to warrant accessing her port.  She can drink fluids at home.  Final Clinical Impressions(s) / ED Diagnoses   Final diagnoses:  Hyperglycemia    ED Discharge Orders    None      Rolland Porter, MD, Barbette Or, MD 06/07/18 [phone]

## 2018-06-09 ENCOUNTER — Telehealth (HOSPITAL_COMMUNITY): Payer: Self-pay | Admitting: *Deleted

## 2018-06-09 NOTE — Telephone Encounter (Signed)
Patient called stating that she was having a bad day. She is extremely nervous. I asked if she was taking her medication and she stated that she was but she hasn't taken the xanax.  I advised her to take the xanax prescribed.   She states that she will take it and give it time to work.    I advised her to call back if she continues to have trouble. She verbalizes understanding.

## 2018-06-10 ENCOUNTER — Emergency Department (HOSPITAL_COMMUNITY)
Admission: EM | Admit: 2018-06-10 | Discharge: 2018-06-10 | Disposition: A | Discharge status: Home / Self Care | Payer: Medicaid Other | Attending: Emergency Medicine | Admitting: Emergency Medicine

## 2018-06-10 ENCOUNTER — Emergency Department (HOSPITAL_COMMUNITY): Payer: Medicaid Other

## 2018-06-10 ENCOUNTER — Ambulatory Visit: Payer: Self-pay | Admitting: General Surgery

## 2018-06-10 ENCOUNTER — Other Ambulatory Visit: Payer: Self-pay

## 2018-06-10 ENCOUNTER — Telehealth (HOSPITAL_COMMUNITY): Payer: Self-pay | Admitting: General Practice

## 2018-06-10 ENCOUNTER — Other Ambulatory Visit (HOSPITAL_COMMUNITY): Payer: Self-pay | Admitting: Internal Medicine

## 2018-06-10 ENCOUNTER — Other Ambulatory Visit (HOSPITAL_COMMUNITY): Payer: Self-pay | Admitting: *Deleted

## 2018-06-10 ENCOUNTER — Encounter (HOSPITAL_COMMUNITY): Payer: Self-pay | Admitting: Emergency Medicine

## 2018-06-10 DIAGNOSIS — R1013 Epigastric pain: Secondary | ICD-10-CM | POA: Diagnosis not present

## 2018-06-10 DIAGNOSIS — I129 Hypertensive chronic kidney disease with stage 1 through stage 4 chronic kidney disease, or unspecified chronic kidney disease: Secondary | ICD-10-CM | POA: Insufficient documentation

## 2018-06-10 DIAGNOSIS — R42 Dizziness and giddiness: Secondary | ICD-10-CM

## 2018-06-10 DIAGNOSIS — N184 Chronic kidney disease, stage 4 (severe): Secondary | ICD-10-CM | POA: Diagnosis not present

## 2018-06-10 DIAGNOSIS — N390 Urinary tract infection, site not specified: Secondary | ICD-10-CM | POA: Insufficient documentation

## 2018-06-10 DIAGNOSIS — Z79899 Other long term (current) drug therapy: Secondary | ICD-10-CM | POA: Diagnosis not present

## 2018-06-10 DIAGNOSIS — Z7982 Long term (current) use of aspirin: Secondary | ICD-10-CM | POA: Insufficient documentation

## 2018-06-10 DIAGNOSIS — R109 Unspecified abdominal pain: Secondary | ICD-10-CM | POA: Diagnosis present

## 2018-06-10 LAB — COMPREHENSIVE METABOLIC PANEL
ALBUMIN: 3.4 g/dL — AB (ref 3.5–5.0)
ALK PHOS: 112 U/L (ref 38–126)
ALT: 19 U/L (ref 0–44)
ANION GAP: 10 (ref 5–15)
AST: 13 U/L — AB (ref 15–41)
BILIRUBIN TOTAL: 0.9 mg/dL (ref 0.3–1.2)
BUN: 35 mg/dL — ABNORMAL HIGH (ref 8–23)
CALCIUM: 8.7 mg/dL — AB (ref 8.9–10.3)
CO2: 23 mmol/L (ref 22–32)
Chloride: 98 mmol/L (ref 98–111)
Creatinine, Ser: 1.51 mg/dL — ABNORMAL HIGH (ref 0.44–1.00)
GFR calc non Af Amer: 34 mL/min — ABNORMAL LOW (ref >60)
GFR, EST AFRICAN AMERICAN: 39 mL/min — AB (ref >60)
GLUCOSE: 197 mg/dL — AB (ref 70–99)
Potassium: 4.6 mmol/L (ref 3.5–5.1)
SODIUM: 131 mmol/L — AB (ref 135–145)
TOTAL PROTEIN: 6.5 g/dL (ref 6.5–8.1)

## 2018-06-10 LAB — CBC WITH DIFFERENTIAL/PLATELET
Abs Immature Granulocytes: 0.03 10*3/uL (ref 0.00–0.07)
BASOS ABS: 0 10*3/uL (ref 0.0–0.1)
Basophils Relative: 1 %
EOS ABS: 0.6 10*3/uL — AB (ref 0.0–0.5)
Eosinophils Relative: 11 %
HEMATOCRIT: 30.1 % — AB (ref 36.0–46.0)
HEMOGLOBIN: 9.8 g/dL — AB (ref 12.0–15.0)
Immature Granulocytes: 1 %
LYMPHS ABS: 1 10*3/uL (ref 0.7–4.0)
Lymphocytes Relative: 20 %
MCH: 31.5 pg (ref 26.0–34.0)
MCHC: 32.6 g/dL (ref 30.0–36.0)
MCV: 96.8 fL (ref 80.0–100.0)
Monocytes Absolute: 0.7 10*3/uL (ref 0.1–1.0)
Monocytes Relative: 14 %
NRBC: 0 % (ref 0.0–0.2)
Neutro Abs: 2.6 10*3/uL (ref 1.7–7.7)
Neutrophils Relative %: 53 %
Platelets: 306 10*3/uL (ref 150–400)
RBC: 3.11 MIL/uL — AB (ref 3.87–5.11)
RDW: 17.2 % — AB (ref 11.5–15.5)
WBC: 4.9 10*3/uL (ref 4.0–10.5)

## 2018-06-10 LAB — LIPASE, BLOOD: Lipase: 113 U/L — ABNORMAL HIGH (ref 11–51)

## 2018-06-10 LAB — URINALYSIS, ROUTINE W REFLEX MICROSCOPIC
Bilirubin Urine: NEGATIVE
GLUCOSE, UA: NEGATIVE mg/dL
Hgb urine dipstick: NEGATIVE
Ketones, ur: 5 mg/dL — AB
NITRITE: NEGATIVE
Protein, ur: NEGATIVE mg/dL
Specific Gravity, Urine: 1.014 (ref 1.005–1.030)
WBC, UA: >50 WBC/hpf — ABNORMAL HIGH (ref 0–5)
pH: 5 (ref 5.0–8.0)

## 2018-06-10 MED ORDER — FAMOTIDINE 20 MG PO TABS: 20.0000 mg | ORAL_TABLET | Freq: Two times a day (BID) | ORAL | 0 refills | Status: DC

## 2018-06-10 MED ORDER — FAMOTIDINE 20 MG PO TABS
20.0000 mg | ORAL_TABLET | Freq: Once | ORAL | Status: AC
  Administered 2018-06-10: 20 mg via ORAL
  Filled 2018-06-10: qty 1

## 2018-06-10 MED ORDER — HEPARIN SOD (PORK) LOCK FLUSH 100 UNIT/ML IV SOLN
INTRAVENOUS | Status: AC
  Administered 2018-06-10: 06:00:00
  Filled 2018-06-10: qty 5

## 2018-06-10 MED ORDER — FENTANYL CITRATE (PF) 100 MCG/2ML IJ SOLN
50.0000 ug | Freq: Once | INTRAMUSCULAR | Status: AC
  Administered 2018-06-10: 50 ug via INTRAVENOUS
  Filled 2018-06-10: qty 2

## 2018-06-10 MED ORDER — CEPHALEXIN 500 MG PO CAPS: 500.0000 mg | ORAL_CAPSULE | Freq: Two times a day (BID) | ORAL | 0 refills | Status: AC

## 2018-06-10 MED ORDER — IOPAMIDOL (ISOVUE-300) INJECTION 61%
50.0000 mL | Freq: Once | INTRAVENOUS | Status: AC | PRN
  Administered 2018-06-10: 30 mL via ORAL

## 2018-06-10 MED ORDER — HEPARIN SOD (PORK) LOCK FLUSH 100 UNIT/ML IV SOLN
500.0000 [IU] | Freq: Once | INTRAVENOUS | Status: AC
  Administered 2018-06-10: 500 [IU]
  Filled 2018-06-10: qty 5

## 2018-06-10 MED ORDER — ONDANSETRON HCL 4 MG/2ML IJ SOLN
4.0000 mg | Freq: Once | INTRAMUSCULAR | Status: AC
  Administered 2018-06-10: 4 mg via INTRAVENOUS
  Filled 2018-06-10: qty 2

## 2018-06-10 MED ORDER — PANTOPRAZOLE SODIUM 40 MG PO TBEC: 40.0000 mg | DELAYED_RELEASE_TABLET | Freq: Two times a day (BID) | ORAL | 0 refills | Status: DC

## 2018-06-10 MED ORDER — ALUM & MAG HYDROXIDE-SIMETH 200-200-20 MG/5ML PO SUSP
30.0000 mL | Freq: Once | ORAL | Status: AC
  Administered 2018-06-10: 30 mL via ORAL
  Filled 2018-06-10: qty 30

## 2018-06-10 NOTE — Telephone Encounter (Signed)
Alexandra Price CSW Progress Notes  Call to patient to check on progress towards goal of linkage w outpatient mental health care. Patient has had multiple ED and inpatient admissions over past month, ED CSW [place] asked to meet w patient and reinforce need for linkage w outpatient care to meet her needs.  Pt states she has new patient appt w Dr Modesta Messing at [facility] [place] on 11/4.  Has new patient appt at [facility] on 11/15 - provider has integrated behavioral health clinician and patient can ask to be referred for care there.  Pt has both Medicare and Medicaid - son is helping her w choices re insurance coverage/carriers.  Main concern today is getting refill of Ambien, request has been sent by nurse navigator to providers, primary oncologist is out until Thursday.  Patient states that Ambien is "the only thing that lets me sleep."  Affirmed patieint's progress towards getting linked w outpatient resources needed for stability, empathized w length of time it takes to become an established patient in a new location.    Alexandra Shell, LCSW Clinical Social Worker Phone:  [phone]

## 2018-06-10 NOTE — ED Provider Notes (Signed)
Patient CT without any significant or emergent abnormalities that would cause her abdominal pain.  Probably this is gastritis given its relation to food.  I will increase her Protonix from once a day to twice a day and have her restart Pepcid which she is not taking.  She does endorse some dysuria over the last week or so and with her UA I think she has an acute UTI.  I discussed giving her antibiotics.  Otherwise she appears stable for discharge home with return precautions.  Referred to GI.  Results for orders placed or performed during the hospital encounter of 06/10/18  CBC with Differential  Result Value Ref Range   WBC 4.9 4.0 - 10.5 K/uL   RBC 3.11 (L) 3.87 - 5.11 MIL/uL   Hemoglobin 9.8 (L) 12.0 - 15.0 g/dL   HCT 30.1 (L) 36.0 - 46.0 %   MCV 96.8 80.0 - 100.0 fL   MCH 31.5 26.0 - 34.0 pg   MCHC 32.6 30.0 - 36.0 g/dL   RDW 17.2 (H) 11.5 - 15.5 %   Platelets 306 150 - 400 K/uL   nRBC 0.0 0.0 - 0.2 %   Neutrophils Relative % 53 %   Neutro Abs 2.6 1.7 - 7.7 K/uL   Lymphocytes Relative 20 %   Lymphs Abs 1.0 0.7 - 4.0 K/uL   Monocytes Relative 14 %   Monocytes Absolute 0.7 0.1 - 1.0 K/uL   Eosinophils Relative 11 %   Eosinophils Absolute 0.6 (H) 0.0 - 0.5 K/uL   Basophils Relative 1 %   Basophils Absolute 0.0 0.0 - 0.1 K/uL   Immature Granulocytes 1 %   Abs Immature Granulocytes 0.03 0.00 - 0.07 K/uL  Comprehensive metabolic panel  Result Value Ref Range   Sodium 131 (L) 135 - 145 mmol/L   Potassium 4.6 3.5 - 5.1 mmol/L   Chloride 98 98 - 111 mmol/L   CO2 23 22 - 32 mmol/L   Glucose, Bld 197 (H) 70 - 99 mg/dL   BUN 35 (H) 8 - 23 mg/dL   Creatinine, Ser 1.51 (H) 0.44 - 1.00 mg/dL   Calcium 8.7 (L) 8.9 - 10.3 mg/dL   Total Protein 6.5 6.5 - 8.1 g/dL   Albumin 3.4 (L) 3.5 - 5.0 g/dL   AST 13 (L) 15 - 41 U/L   ALT 19 0 - 44 U/L   Alkaline Phosphatase 112 38 - 126 U/L   Total Bilirubin 0.9 0.3 - 1.2 mg/dL   GFR calc non Af Amer 34 (L) >60 mL/min   GFR calc Af Amer 39 (L) >60  mL/min   Anion gap 10 5 - 15  Lipase, blood  Result Value Ref Range   Lipase 113 (H) 11 - 51 U/L  Urinalysis, Routine w reflex microscopic  Result Value Ref Range   Color, Urine YELLOW YELLOW   APPearance HAZY (A) CLEAR   Specific Gravity, Urine 1.014 1.005 - 1.030   pH 5.0 5.0 - 8.0   Glucose, UA NEGATIVE NEGATIVE mg/dL   Hgb urine dipstick NEGATIVE NEGATIVE   Bilirubin Urine NEGATIVE NEGATIVE   Ketones, ur 5 (A) NEGATIVE mg/dL   Protein, ur NEGATIVE NEGATIVE mg/dL   Nitrite NEGATIVE NEGATIVE   Leukocytes, UA MODERATE (A) NEGATIVE   RBC / HPF 0-5 0 - 5 RBC/hpf   WBC, UA >50 (H) 0 - 5 WBC/hpf   Bacteria, UA RARE (A) NONE SEEN   Squamous Epithelial / LPF 0-5 0 - 5   Mucus PRESENT  Uric Acid Crys, UA PRESENT    Ct Abdomen Pelvis Wo Contrast  Result Date: 06/10/2018 CLINICAL DATA:  Abdominal pain and nausea. History of multiple myeloma EXAM: CT ABDOMEN AND PELVIS WITHOUT CONTRAST TECHNIQUE: Multidetector CT imaging of the abdomen and pelvis was performed following the standard protocol without IV contrast. Oral contrast was administered. COMPARISON:  May 11, 2018 FINDINGS: Lower chest: There is scarring in the left lung base region. There is no lung base edema or consolidation. Hepatobiliary: No focal liver lesions are evident on this noncontrast enhanced study. The gallbladder wall is not appreciably thickened. There is no biliary duct dilatation. Pancreas: No pancreatic mass. No inflammatory appearing change. Punctate focus of calcification in the body of the pancreas again noted, possibly residua of previous inflammatory episode. No new calcification evident. Spleen: No splenic lesions are evident. Adrenals/Urinary Tract: Adrenals bilaterally appear normal. Kidneys bilaterally show no evident mass or hydronephrosis on either side. There is no renal or ureteral calculus on either side. Urinary bladder is midline with wall thickness within normal limits for degree of distention.  Stomach/Bowel: There is moderate stool throughout the colon. There is no appreciable bowel wall or mesenteric thickening. No evident bowel obstruction. No free air or portal venous air. Vascular/Lymphatic: There are scattered foci atherosclerotic calcification in the aorta and common iliac arteries. No aneurysm evident. Major mesenteric arterial vessels appear patent on this noncontrast enhanced study. There is no demonstrable adenopathy in the abdomen or pelvis. Reproductive: Uterus is absent.  No evident pelvic mass. Other: Appendix absent. No periappendiceal region inflammation. No abscess or ascites evident in the abdomen or pelvis. There is a minimal ventral hernia containing only fat. Musculoskeletal: Widespread lytic change throughout the visualized bony structures consistent with multiple myeloma again noted. Bones diffusely osteoporotic, a finding that may be associated with multiple myeloma. There is anterior wedging of the T11 and L4 vertebral bodies, stable in appearance. Old rib fractures on the left peer grossly stable. No new fracture evident compared to recent prior study. No intramuscular lesions are evident. IMPRESSION: 1.  Persistent bony changes consistent with multiple myeloma. 2. No evident bowel obstruction. No abscess in the abdomen or pelvis. Appendix absent. 3.  No evident renal or ureteral calculus.  No hydronephrosis. 4.  Foci of aortoiliac atherosclerosis. 5. Stable punctate calcification in the body of the pancreas of uncertain etiology. Pancreas otherwise appears unremarkable. Aortic Atherosclerosis (ICD10-I70.0). Electronically Signed   By: Lowella Grip III M.D.   On: 06/10/2018 09:06   Ct Abdomen Pelvis Wo Contrast  Result Date: 05/20/2018 CLINICAL DATA:  Right lower quadrant pain for 4 days, constipation, some nausea, history of multiple myeloma EXAM: CT ABDOMEN AND PELVIS WITHOUT CONTRAST TECHNIQUE: Multidetector CT imaging of the abdomen and pelvis was performed following  the standard protocol without IV contrast. COMPARISON:  CT abdomen pelvis of 05/11/2017 FINDINGS: Lower chest: Linear scarring or atelectasis is again noted medially at the left lung base. No pneumonia or pleural effusion is seen. Heart size is stable. Hepatobiliary: No focal liver abnormality is seen. No gallstones, gallbladder wall thickening, or biliary dilatation. The liver is unremarkable in the unenhanced state. No calcified gallstones are seen. Pancreas: The pancreas is normal in size and the pancreatic duct is not dilated. Spleen: The spleen is unremarkable. There is a small splenic artery calcification near the splenic hilus. Adrenals/Urinary Tract: The adrenal glands appear normal. No renal calculi are seen and there is no evidence of hydronephrosis. The ureters are normal in caliber. The urinary bladder  is not well distended but no abnormality is seen. Stomach/Bowel: The stomach is largely decompressed. No small bowel abnormality is seen. No abnormality of the colon is seen other than possible edema at the base of the cecum. The appendix appears thickened and fluid filled extending caudally and anteriorly toward the right inguinal canal region. The appendix measures up to 13 mm in diameter. There is some strandiness of the surrounding soft tissue planes but no evidence of rupture or abscess formation is seen. Vascular/Lymphatic: Abdominal aorta is normal in caliber with mild abdominal aortic atherosclerosis for age. No adenopathy is seen with only small retroperitoneal nodes and mesenteric nodes present. Reproductive: The uterus has previously been resected. No adnexal lesion is seen. A tiny amount of fluid layers in the pelvis. Other: No abdominal wall hernia is seen Musculoskeletal: Diffuse bone lesions are present in this patient with known multiple myeloma with partial compression of anterior superior aspect of L4 and T11 again noted. IMPRESSION: 1. Dilated edematous and somewhat rigid appendix  measuring up to 13 mm in diameter with some surrounding strandiness of the fat planes indicative of acute appendicitis. No complicating features are seen. 2. Diffuse bone lesions consistent with known multiple myeloma. Electronically Signed   By: Ivar Drape M.D.   On: 05/20/2018 13:17   Ct Abdomen Pelvis Wo Contrast  Result Date: 05/11/2018 CLINICAL DATA:  72 year old female with abdominal pain. History of multiple myeloma. EXAM: CT ABDOMEN AND PELVIS WITHOUT CONTRAST TECHNIQUE: Multidetector CT imaging of the abdomen and pelvis was performed following the standard protocol without IV contrast. COMPARISON:  Skeletal survey radiograph dated 04/07/2018 FINDINGS: Evaluation of this exam is limited in the absence of intravenous contrast. Lower chest: Left lung base linear atelectasis/scarring. The visualized lung bases are otherwise clear. There is hypoattenuation of the cardiac blood pool suggestive of a degree of anemia. Clinical correlation is recommended. The tip of Port-A-Cath is partially visualized in the region of the cavoatrial junction. No intra-abdominal free air or free fluid. Hepatobiliary: No focal liver abnormality is seen. No gallstones, gallbladder wall thickening, or biliary dilatation. Pancreas: Punctate focus of calcification in the body of the pancreas may be related to vascular calcification or sequela of prior inflammation. The pancreas is otherwise unremarkable. Spleen: Normal in size without focal abnormality. Adrenals/Urinary Tract: Adrenal glands are unremarkable. Kidneys are normal, without renal calculi, focal lesion, or hydronephrosis. Bladder is unremarkable. Stomach/Bowel: There is loose stool within the colon compatible with diarrheal state. There is no bowel obstruction or active inflammation. Normal appendix. Vascular/Lymphatic: Mild aortoiliac atherosclerotic disease. The abdominal aorta and IVC are otherwise grossly unremarkable on this noncontrast CT. No portal venous gas.  There is no adenopathy. Reproductive: Hysterectomy.  No pelvic mass. Other: None Musculoskeletal: Osteopenia. Innumerable lytic osseous lesions consistent with known multiple myeloma. There is compression fracture of the superior endplate of L4 with approximately 40% loss of vertebral body height centrally similar or progressed since the prior radiograph. There is compression fracture of the T11 with approximately 50% loss of vertebral body height and mild anterior wedging similar to prior radiograph. There are multiple old appearing left posterior rib fractures. No definite acute fracture. Evaluation for acute fractures however limited due to advanced osteopenia and extensive lytic lucencies. IMPRESSION: 1. Diarrheal state. Correlation with clinical exam and stool cultures recommended. No bowel obstruction. Normal appendix. 2. Innumerable osseous lytic lesions in keeping with known multiple myeloma. Known T11 and L4 compression fractures as well as multiple old-appearing left posterior rib fractures. No definite acute  fracture. Electronically Signed   By: Anner Crete M.D.   On: 05/11/2018 23:00   Dg Chest 1 View  Result Date: 05/22/2018 CLINICAL DATA:  Abdominal pain and fever in a patient with a history of multiple myeloma. EXAM: CHEST  1 VIEW COMPARISON:  PA and lateral chest 05/20/2018 and 05/11/2018. FINDINGS: Lung volumes are low but the lungs are clear. Heart size is normal. No pneumothorax or pleural effusion. Port-A-Cath is in place. Multiple lytic bony lesions consistent with multiple myeloma noted. IMPRESSION: No acute disease. Lytic bone lesions consistent with multiple myeloma. Electronically Signed   By: Inge Rise M.D.   On: 05/22/2018 11:17   Dg Chest 2 View  Result Date: 05/31/2018 CLINICAL DATA:  Shortness of breath. EXAM: CHEST - 2 VIEW COMPARISON:  Chest x-ray dated May 27, 2018. FINDINGS: Unchanged right chest wall port catheter. The heart size and mediastinal contours are  within normal limits. Normal pulmonary vascularity. No focal consolidation, pleural effusion, or pneumothorax. No acute osseous abnormality. Unchanged lower thoracic compression fracture. Lytic lesions involving both clavicles, scapula, and proximal humeri are similar to prior study. IMPRESSION: 1.  No active cardiopulmonary disease. 2. Sequela of multiple myeloma, unchanged. Electronically Signed   By: Titus Dubin M.D.   On: 05/31/2018 17:22   Dg Chest 2 View  Result Date: 05/27/2018 CLINICAL DATA:  Multiple myeloma.  Dysphagia. EXAM: CHEST - 2 VIEW COMPARISON:  May 26, 2018 FINDINGS: Port-A-Cath tip is in the superior vena cava. No pneumothorax. No edema or consolidation. Heart size and pulmonary vascularity are normal. No adenopathy. There is an expansile lesion along the superior, medial left scapula. There is widespread lytic bony abnormality consistent with known multiple myeloma. IMPRESSION: Extensive bony changes of multiple myeloma. No edema or consolidation. No evident adenopathy. Electronically Signed   By: Lowella Grip III M.D.   On: 05/27/2018 15:40   Dg Chest 2 View  Result Date: 05/24/2018 CLINICAL DATA:  Shortness of breath, left chest pain. History of myeloma. EXAM: CHEST - 2 VIEW COMPARISON:  05/23/2018 FINDINGS: Right Port-A-Cath in place with the tip in the SVC, unchanged. Heart is normal size. No confluent airspace opacities or effusions. Diffuse lytic lesions throughout the bony skeleton compatible with myeloma. Multiple healing bilateral rib fractures. IMPRESSION: No acute cardiopulmonary disease. Diffuse lytic lesions throughout the bones compatible with myeloma with healing bilateral rib fractures Electronically Signed   By: Rolm Baptise M.D.   On: 05/24/2018 14:39   Dg Chest 2 View  Result Date: 05/20/2018 CLINICAL DATA:  Cough, dizziness, and chest pain. History of multiple myeloma. Nonsmoker. EXAM: CHEST - 2 VIEW COMPARISON:  PA and lateral chest x-ray of May 11, 2018 FINDINGS: The lungs are adequately inflated. The interstitial markings are coarse though stable. Confluent density projecting over the posterior aspect of the right seventh rib is consistent with a myomatous rib lesion and is stable. The heart and pulmonary vascularity are normal. There is faint calcification in the wall of the aortic arch. The power port catheter tip projects over the midportion of the SVC. There is chronic partial compression of the body of T11 or T12. There are innumerable permeative lesions within the humeri, clavicles, and scapulae. There is deformity of the lateral aspect of the right fourth rib consistent with a previous fracture. IMPRESSION: Chronic bronchitic changes. No alveolar pneumonia nor pulmonary edema. Extensive myomatous involvement of the visualized bony structures. Thoracic aortic atherosclerosis. Electronically Signed   By: David  Martinique M.D.   On: 05/20/2018  11:36   Dg Chest 2 View  Result Date: 05/11/2018 CLINICAL DATA:  Abdominal pain EXAM: CHEST - 2 VIEW COMPARISON:  04/27/2018 FINDINGS: Right Port-A-Cath remains in place, unchanged. Heart is normal size. No confluent airspace opacities or effusions. Numerous lucencies throughout the visualized skeletal structures compatible with diffuse widespread myeloma. IMPRESSION: No acute cardiopulmonary disease. Electronically Signed   By: Rolm Baptise M.D.   On: 05/11/2018 22:57   Ct Angio Chest Pe W And/or Wo Contrast  Result Date: 06/04/2018 CLINICAL DATA:  This study identified as missing a report due to unplanned IT downtime at 6:23 pm on 06/04/2018. 72 year old female with chest pain and shortness of breath for 2 days. Multiple myeloma. EXAM: CT ANGIOGRAPHY CHEST WITH CONTRAST TECHNIQUE: Multidetector CT imaging of the chest was performed using the standard protocol during bolus administration of intravenous contrast. Multiplanar CT image reconstructions and MIPs were obtained to evaluate the vascular anatomy.  CONTRAST:  67m ISOVUE-370 IOPAMIDOL (ISOVUE-370) INJECTION 76% COMPARISON:  CT Abdomen and Pelvis 05/20/2018. FINDINGS: Cardiovascular: Good contrast bolus timing in the pulmonary arterial tree. No focal filling defect identified in the pulmonary arteries to suggest acute pulmonary embolism. Mild cardiomegaly. No pericardial effusion. Mild Calcified aortic atherosclerosis. Otherwise negative visible aorta. Mediastinum/Nodes: Negative.  No lymphadenopathy. Lungs/Pleura: Major airways are patent. There is curvilinear scarring or atelectasis in the medial basal segment of the left lower lobe. No pleural effusion or other abnormal pulmonary opacity. Upper Abdomen: Negative visible liver, gallbladder, spleen, pancreas, adrenal glands, kidneys, and bowel in the upper abdomen. Musculoskeletal: Diffusely abnormal visible skeletal structures with innumerable lytic lesions. Expansile lesion of the distal left clavicle. Pathologic fracture of the T11 vertebral body. Mild bony retropulsion without significant spinal stenosis. Mild expansion of the manubrium. Multilevel mildly displaced probably pathologic fractures of the posterolateral left ribs. Review of the MIP images confirms the above findings. IMPRESSION: 1.  Negative for acute pulmonary embolus. 2. Diffuse lytic lesions throughout the visible skeleton in keeping with advanced Multiple Myeloma. Mild pathologic fractures of left posterolateral ribs and the T11 vertebral body. 3. Mild cardiomegaly.  No significant pulmonary abnormality. Electronically Signed   By: HGenevie AnnM.D.   On: 06/04/2018 18:28   Nm Pulmonary Perf And Vent  Result Date: 05/23/2018 CLINICAL DATA:  Three days post appendectomy, chest pain, high pretest clinical probability for pulmonary embolism EXAM: NUCLEAR MEDICINE VENTILATION - PERFUSION LUNG SCAN TECHNIQUE: Ventilation images were obtained in multiple projections using inhaled aerosol [id number]. Perfusion images were obtained in multiple  projections after intravenous injection of Tc-9929mA. RADIOPHARMACEUTICALS:  32 mCi of Tc-74m56mA aerosol inhalation and 4.4 mCi Tc74m-49mIV COMPARISON:  None Correlation: Chest radiograph 05/23/2018 FINDINGS: Ventilation: Normal Perfusion: Diminished perfusion at the posterior lung bases bilaterally. Observed pattern suggests presence of small bibasilar pleural effusions or minimal atelectasis. Portable chest radiograph demonstrates subsegmental atelectasis at LEFT base but no definite pleural effusions. No other perfusion defects identified. IMPRESSION: Low probability for pulmonary embolism. Electronically Signed   By: Mark Lavonia Dana   On: 05/23/2018 12:26   Us VeKoreaus Img Lower Bilateral  Result Date: 05/23/2018 CLINICAL DATA:  Bilateral lower extremity pain and edema. Elevated D-dimer. History of multiple myeloma. Evaluate for DVT EXAM: BILATERAL LOWER EXTREMITY VENOUS DOPPLER ULTRASOUND TECHNIQUE: Gray-scale sonography with graded compression, as well as color Doppler and duplex ultrasound were performed to evaluate the lower extremity deep venous systems from the level of the common femoral vein and including the common femoral, femoral, profunda femoral, popliteal and calf veins  including the posterior tibial, peroneal and gastrocnemius veins when visible. The superficial great saphenous vein was also interrogated. Spectral Doppler was utilized to evaluate flow at rest and with distal augmentation maneuvers in the common femoral, femoral and popliteal veins. COMPARISON:  None. FINDINGS: RIGHT LOWER EXTREMITY Common Femoral Vein: No evidence of thrombus. Normal compressibility, respiratory phasicity and response to augmentation. Saphenofemoral Junction: No evidence of thrombus. Normal compressibility and flow on color Doppler imaging. Profunda Femoral Vein: No evidence of thrombus. Normal compressibility and flow on color Doppler imaging. Femoral Vein: No evidence of thrombus. Normal compressibility,  respiratory phasicity and response to augmentation. Popliteal Vein: No evidence of thrombus. Normal compressibility, respiratory phasicity and response to augmentation. Calf Veins: No evidence of thrombus. Normal compressibility and flow on color Doppler imaging. Superficial Great Saphenous Vein: No evidence of thrombus. Normal compressibility. Venous Reflux:  None. Other Findings:  None. LEFT LOWER EXTREMITY Common Femoral Vein: No evidence of thrombus. Normal compressibility, respiratory phasicity and response to augmentation. Saphenofemoral Junction: No evidence of thrombus. Normal compressibility and flow on color Doppler imaging. Profunda Femoral Vein: No evidence of thrombus. Normal compressibility and flow on color Doppler imaging. Femoral Vein: No evidence of thrombus. Normal compressibility, respiratory phasicity and response to augmentation. Popliteal Vein: No evidence of thrombus. Normal compressibility, respiratory phasicity and response to augmentation. Calf Veins: No evidence of thrombus. Normal compressibility and flow on color Doppler imaging. Superficial Great Saphenous Vein: No evidence of thrombus. Normal compressibility. Venous Reflux:  None. Other Findings:  None. IMPRESSION: No evidence of DVT within either lower extremity. Electronically Signed   By: Sandi Mariscal M.D.   On: 05/23/2018 12:43   Dg Chest Portable 1 View  Result Date: 06/04/2018 CLINICAL DATA:  Chest pain radiating to the abdomen. Dyspnea x2 days. EXAM: PORTABLE CHEST 1 VIEW COMPARISON:  05/31/2018 FINDINGS: Normal heart size. Mild aortic atherosclerosis without aneurysm rib port catheter tip terminates in the distal SVC. Mild diffuse chronic interstitial prominence is identified without alveolar consolidation, CHF, effusion or pneumothorax. Diffuse scattered lytic lucencies are noted about the bony thorax, humeri and both shoulders compatible with history of multiple myeloma. IMPRESSION: Chronic coarsened interstitial lung  markings without alveolar consolidation or CHF. Aortic atherosclerosis. Redemonstration of lytic lucencies about the bony thorax and shoulders compatible with history of multiple myeloma. Electronically Signed   By: Ashley Royalty M.D.   On: 06/04/2018 13:39   Dg Chest Port 1 View  Result Date: 05/23/2018 CLINICAL DATA:  Multiple myeloma.  Unexplained fever. EXAM: PORTABLE CHEST 1 VIEW COMPARISON:  05/22/2018.  05/20/2018.  05/11/2018. FINDINGS: Power port remains in place. Heart size is normal. Chronic interstitial lung markings are again evident. There is mild patchy atelectasis and or pneumonia at the left lung base. The remainder the chest is clear. Multiple lytic skeletal lesions again demonstrated. IMPRESSION: New small area of atelectasis or pneumonia in the left lower lobe. Chronic interstitial lung markings elsewhere. Chronic lytic bone lesions. Electronically Signed   By: Nelson Chimes M.D.   On: 05/23/2018 07:37   Dg Abd Acute W/chest  Result Date: 05/26/2018 CLINICAL DATA:  Loose stools.  History of multiple myeloma. EXAM: DG ABDOMEN ACUTE W/ 1V CHEST COMPARISON:  Chest radiograph, 05/24/2018. Abdomen and pelvis CT, 05/20/2018. FINDINGS: Normal bowel gas pattern. Specifically, no evidence of obstruction, generalized adynamic ileus or free air. No evidence of renal or ureteral stones. Skin staples are noted overlying the central abdomen and lower pelvis. Soft tissues are otherwise unremarkable. Cardiac silhouette is normal in size.  No mediastinal or hilar masses. Lungs show prominent bronchovascular and interstitial markings, but no evidence of pneumonia or pulmonary edema and no change. Right anterior chest wall Port-A-Cath is stable. Skeletal structures are demineralized with numerous well-defined lytic lesions consistent with the given history of multiple myeloma. IMPRESSION: 1. No acute findings. No evidence of bowel obstruction, generalized adynamic ileus or free air. 2. No acute cardiopulmonary  disease. Electronically Signed   By: Lajean Manes M.D.   On: 05/26/2018 15:06      Sherwood Gambler, MD 06/10/18 [phone]

## 2018-06-10 NOTE — ED Triage Notes (Signed)
Pt c/o mid stomach pain that started at 2000 last night and nausea. Denies V/D.

## 2018-06-10 NOTE — Discharge Instructions (Signed)
Your symptoms are most likely coming from inflammation of your stomach.  You will need to increase the dose of your Protonix from once a day to twice a day.  You also need to restart Pepcid.  If you develop worsening pain, vomiting, fever, or your pain does not go away in the next 1-2 days or improve they need to return to the ER for evaluation.  Otherwise you need to follow-up with your primary care physician this week.  You are also being diagnosed with a urinary tract infection and should take the antibiotics to help treat this.

## 2018-06-10 NOTE — ED Provider Notes (Signed)
[facility] EMERGENCY DEPARTMENT Provider Note   CSN: [id number] Arrival date & time: 06/10/18  0439     History   Chief Complaint Chief Complaint  Patient presents with  . Abdominal Pain    HPI Alexandra Price is a 72 y.o. female.  HPI  This is a 72 year old female with a history of multiple myeloma, hypertension, recent appendectomy who presents with abdominal pain.  Patient reports abdominal pain that started yesterday evening after eating a peanut butter sandwich.  She reports that it is upper abdominal pain.  It is sharp and radiates to the back.  No chest pain or shortness of breath.  She took a hydrocodone with minimal relief.  She reports nausea without vomiting or diarrhea.  Her last normal bowel movement was yesterday.  No recent illnesses or fevers.  She reports decreased urinary output.  As stated, patient did just have her appendix removed.  She subsequently finished a course of steroids for a rash.  She has had several evaluations for hyperglycemia.  Past Medical History:  Diagnosis Date  . Anxiety   . Depression   . Hypertension   . Multiple myeloma ([place])    multiple myeloma    Patient Active Problem List   Diagnosis Date Noted  . Chest pain 06/04/2018  . Depression 05/29/2018  . Hyperglycemia 05/28/2018  . HTN (hypertension) 05/28/2018  . Anxiety 05/28/2018  . Rash 05/27/2018  . Pseudomonas infection 05/26/2018  . SOB (shortness of breath) 05/24/2018  . CAP (community acquired pneumonia) 05/23/2018  . Fever 05/22/2018  . Sinus tachycardia 05/22/2018  . CKD (chronic kidney disease) stage 4, GFR 15-29 ml/min (HCC) 05/22/2018  . S/P laparoscopic appendectomy 05/20/2018  . Multiple myeloma without remission ([place]) 04/15/2018  . Goals of care, counseling/discussion 04/15/2018  . Multiple myeloma not having achieved remission ([place]) 04/07/2018    Past Surgical History:  Procedure Laterality Date  . ABDOMINAL HYSTERECTOMY     total  . APPENDECTOMY      . LAPAROSCOPIC APPENDECTOMY N/A 05/20/2018   Procedure: APPENDECTOMY LAPAROSCOPIC;  Surgeon: [name], MD;  Location: [facility];  Service: General;  Laterality: N/A;  . PORTACATH PLACEMENT Right 04/14/2018   Procedure: INSERTION PORT-A-CATH;  Surgeon: [name], MD;  Location: [facility] ORS;  Service: General;  Laterality: Right;     OB History   None      Home Medications    Prior to Admission medications   Medication Sig Start Date End Date Taking? Authorizing Provider  acyclovir (ZOVIRAX) 400 MG tablet Take 1 tablet (400 mg total) by mouth 2 (two) times daily. 05/23/18   Johnson, Clanford L, MD  allopurinol (ZYLOPRIM) 300 MG tablet Take 1 tablet (300 mg total) by mouth daily. 04/24/18   [name], MD  aspirin EC 81 MG tablet Take 81 mg by mouth daily.    [provider]  Calcium Carb-Cholecalciferol (CALCIUM 1000 + D PO) Take 1,000 mg by mouth daily. Take one tablet daily until next visit with oncologist.    [provider]  ciprofloxacin (CIPRO) 500 MG tablet Take 500 mg by mouth 2 (two) times daily.    [provider]  citalopram (CELEXA) 20 MG tablet Take 1 tablet (20 mg total) by mouth daily. 05/30/18   Kathie Dike, MD  famotidine (PEPCID) 20 MG tablet Take 1 tablet (20 mg total) by mouth 2 (two) times daily. 05/31/18   Milton Ferguson, MD  HYDROcodone-acetaminophen (NORCO) 5-325 MG tablet Take 1 tablet by mouth 3 (three) times  daily as needed for moderate pain. 06/06/18   Lockamy, Randi L, NP-C  hydrocortisone cream 1 % Apply topically 2 (two) times daily. 05/29/18   Kathie Dike, MD  hydrOXYzine (ATARAX/VISTARIL) 25 MG tablet Take 1 tablet (25 mg total) by mouth every 6 (six) hours as needed for itching (rash). 05/31/18   Milton Ferguson, MD  lidocaine-prilocaine (EMLA) cream Apply to affected area once 04/15/18   [name], MD  lisinopril (PRINIVIL,ZESTRIL) 10 MG tablet Take 1 tablet (10 mg total) by mouth daily. 04/10/18   Nat Christen, MD  metFORMIN (GLUCOPHAGE) 500 MG tablet Take 1 tablet (500 mg total) by mouth 2 (two) times daily with a meal. 05/29/18 05/29/19  Kathie Dike, MD  ondansetron (ZOFRAN) 4 MG tablet Take 1 tablet (4 mg total) by mouth every 8 (eight) hours as needed for nausea or vomiting. 04/22/18   Lockamy, Randi L, NP-C  pantoprazole (PROTONIX) 40 MG tablet Take 1 tablet (40 mg total) by mouth daily. 05/09/18   Lockamy, Randi L, NP-C  polyethylene glycol (MIRALAX / GLYCOLAX) packet Take 17 g by mouth daily. 05/23/18 06/22/18  Johnson, Clanford L, MD  predniSONE (DELTASONE) 50 MG tablet Take 1 tablet (50 mg total) by mouth daily with breakfast. 05/30/18   Kathie Dike, MD  prochlorperazine (COMPAZINE) 10 MG tablet Take 1 tablet (10 mg total) by mouth every 6 (six) hours as needed (Nausea or vomiting). 04/15/18   [name], MD  trolamine salicylate (ASPERCREME) 10 % cream Apply 1 application topically daily as needed for muscle pain.    [provider]    Family History Family History  Problem Relation Age of Onset  . Heart disease Mother   . Emphysema Father   . Diabetes Sister   . Cancer Brother        liver, lung, and colon  . Diabetes Brother     Social History Social History   Tobacco Use  . Smoking status: Never Smoker  . Smokeless tobacco: Never Used  Substance Use Topics  . Alcohol use: Never    Frequency: Never  . Drug use: Not Currently     Allergies   Ciprofloxacin and Morphine and related   Review of Systems Review of Systems  Constitutional: Negative for fever.  Respiratory: Negative for shortness of breath.   Cardiovascular: Negative for chest pain.  Gastrointestinal: Positive for abdominal pain and nausea. Negative for constipation, diarrhea and vomiting.  Genitourinary: Negative for dysuria.  Neurological: Negative for headaches.  All other systems reviewed and are negative.    Physical Exam Updated Vital Signs BP 110/62   Pulse 81    Temp 97.9 F (36.6 C) (Oral)   Resp 18   Ht 1.676 m (_0 )   Wt 71.2 kg   SpO2 92%   BMI 25.34 kg/m   Physical Exam  Constitutional: She is oriented to person, place, and time.  Elderly, nontoxic-appearing  HENT:  Head: Normocephalic and atraumatic.  Neck: Neck supple.  Cardiovascular: Normal rate, regular rhythm and normal heart sounds.  Pulmonary/Chest: Effort normal. No respiratory distress. She has no wheezes.  Abdominal: Soft. Bowel sounds are normal. There is tenderness in the epigastric area. There is no rebound and no guarding.  Minimal epigastric tenderness to palpation, no rebound or guarding  Neurological: She is alert and oriented to person, place, and time.  Skin: Skin is warm and dry.  Psychiatric: She has a normal mood and affect.  Nursing note and vitals reviewed.    ED  Treatments / Results  Labs (all labs ordered are listed, but only abnormal results are displayed) Labs Reviewed  CBC WITH DIFFERENTIAL/PLATELET - Abnormal; Notable for the following components:      Result Value   RBC 3.11 (*)    Hemoglobin 9.8 (*)    HCT 30.1 (*)    RDW 17.2 (*)    Eosinophils Absolute 0.6 (*)    All other components within normal limits  COMPREHENSIVE METABOLIC PANEL - Abnormal; Notable for the following components:   Sodium 131 (*)    Glucose, Bld 197 (*)    BUN 35 (*)    Creatinine, Ser 1.51 (*)    Calcium 8.7 (*)    Albumin 3.4 (*)    AST 13 (*)    GFR calc non Af Amer 34 (*)    GFR calc Af Amer 39 (*)    All other components within normal limits  LIPASE, BLOOD - Abnormal; Notable for the following components:   Lipase 113 (*)    All other components within normal limits  URINALYSIS, ROUTINE W REFLEX MICROSCOPIC    EKG None  Radiology No results found.  Procedures Procedures (including critical care time)  Medications Ordered in ED Medications  ondansetron (ZOFRAN) injection 4 mg (4 mg Intravenous Given 06/10/18 0521)  fentaNYL (SUBLIMAZE)  injection 50 mcg (50 mcg Intravenous Given 06/10/18 0521)  heparin lock flush 100 UNIT/ML injection (  Given 06/10/18 0531)  iopamidol (ISOVUE-300) 61 % injection 50 mL (30 mLs Oral Contrast Given 06/10/18 0616)     Initial Impression / Assessment and Plan / ED Course  I have reviewed the triage vital signs and the nursing notes.  Pertinent labs & imaging results that were available during my care of the patient were reviewed by me and considered in my medical decision making (see chart for details).     Patient presents with abdominal pain.  Onset after eating last night.  She is overall nontoxic-appearing and vital signs are reassuring.  Mild tenderness without rebound or guarding.  No signs of peritonitis.  Given location of pain could be related to gastritis given recent steroid use, ulcer, pancreatitis, bowel bladder pathology.  Patient was given pain and nausea medicine.  Lab work notable for mild hypoglycemia at 197.  Creatinine is at baseline.  No significant leukocytosis.  Lipase is elevated to 113 which is less than 3 times upper limit of normal.  Patient has ongoing discomfort.  Will obtain a CT scan to better evaluate.  Final Clinical Impressions(s) / ED Diagnoses   Final diagnoses:  None    ED Discharge Orders    None       Horton, Barbette Hair, MD 06/10/18 [phone]

## 2018-06-11 ENCOUNTER — Other Ambulatory Visit (HOSPITAL_COMMUNITY): Payer: Self-pay | Admitting: *Deleted

## 2018-06-11 LAB — URINE CULTURE

## 2018-06-12 ENCOUNTER — Inpatient Hospital Stay (HOSPITAL_COMMUNITY): Payer: Medicaid Other

## 2018-06-12 ENCOUNTER — Encounter (HOSPITAL_COMMUNITY): Payer: Self-pay | Admitting: Hematology

## 2018-06-12 ENCOUNTER — Inpatient Hospital Stay (HOSPITAL_COMMUNITY): Payer: Medicaid Other | Attending: Hematology | Admitting: Hematology

## 2018-06-12 ENCOUNTER — Other Ambulatory Visit: Payer: Self-pay

## 2018-06-12 DIAGNOSIS — C9000 Multiple myeloma not having achieved remission: Secondary | ICD-10-CM

## 2018-06-12 DIAGNOSIS — M549 Dorsalgia, unspecified: Secondary | ICD-10-CM | POA: Insufficient documentation

## 2018-06-12 DIAGNOSIS — Z5112 Encounter for antineoplastic immunotherapy: Secondary | ICD-10-CM | POA: Insufficient documentation

## 2018-06-12 DIAGNOSIS — F419 Anxiety disorder, unspecified: Secondary | ICD-10-CM | POA: Insufficient documentation

## 2018-06-12 LAB — LACTATE DEHYDROGENASE: LDH: 140 U/L (ref 98–192)

## 2018-06-12 LAB — COMPREHENSIVE METABOLIC PANEL
ALT: 18 U/L (ref 0–44)
AST: 11 U/L — AB (ref 15–41)
Albumin: 3.5 g/dL (ref 3.5–5.0)
Alkaline Phosphatase: 118 U/L (ref 38–126)
Anion gap: 9 (ref 5–15)
BUN: 42 mg/dL — ABNORMAL HIGH (ref 8–23)
CHLORIDE: 97 mmol/L — AB (ref 98–111)
CO2: 26 mmol/L (ref 22–32)
Calcium: 8.5 mg/dL — ABNORMAL LOW (ref 8.9–10.3)
Creatinine, Ser: 2.1 mg/dL — ABNORMAL HIGH (ref 0.44–1.00)
GFR, EST AFRICAN AMERICAN: 26 mL/min — AB (ref >60)
GFR, EST NON AFRICAN AMERICAN: 23 mL/min — AB (ref >60)
Glucose, Bld: 178 mg/dL — ABNORMAL HIGH (ref 70–99)
POTASSIUM: 5.1 mmol/L (ref 3.5–5.1)
SODIUM: 132 mmol/L — AB (ref 135–145)
TOTAL PROTEIN: 6.6 g/dL (ref 6.5–8.1)
Total Bilirubin: 0.7 mg/dL (ref 0.3–1.2)

## 2018-06-12 LAB — CBC WITH DIFFERENTIAL/PLATELET
ABS IMMATURE GRANULOCYTES: 0.02 10*3/uL (ref 0.00–0.07)
BASOS PCT: 1 %
Basophils Absolute: 0.1 10*3/uL (ref 0.0–0.1)
EOS ABS: 1.7 10*3/uL — AB (ref 0.0–0.5)
Eosinophils Relative: 27 %
HCT: 31.3 % — ABNORMAL LOW (ref 36.0–46.0)
Hemoglobin: 9.7 g/dL — ABNORMAL LOW (ref 12.0–15.0)
IMMATURE GRANULOCYTES: 0 %
Lymphocytes Relative: 9 %
Lymphs Abs: 0.6 10*3/uL — ABNORMAL LOW (ref 0.7–4.0)
MCH: 30.6 pg (ref 26.0–34.0)
MCHC: 31 g/dL (ref 30.0–36.0)
MCV: 98.7 fL (ref 80.0–100.0)
Monocytes Absolute: 0.4 10*3/uL (ref 0.1–1.0)
Monocytes Relative: 6 %
NEUTROS ABS: 3.7 10*3/uL (ref 1.7–7.7)
NEUTROS PCT: 57 %
PLATELETS: 262 10*3/uL (ref 150–400)
RBC: 3.17 MIL/uL — AB (ref 3.87–5.11)
RDW: 17.5 % — AB (ref 11.5–15.5)
WBC: 6.4 10*3/uL (ref 4.0–10.5)
nRBC: 0 % (ref 0.0–0.2)

## 2018-06-12 MED ORDER — SODIUM CHLORIDE 0.9 % IV SOLN
Freq: Once | INTRAVENOUS | Status: AC
  Administered 2018-06-12: 09:00:00 via INTRAVENOUS

## 2018-06-12 MED ORDER — SODIUM CHLORIDE 0.9% FLUSH
10.0000 mL | INTRAVENOUS | Status: DC | PRN
  Administered 2018-06-12: 10 mL
  Filled 2018-06-12: qty 10

## 2018-06-12 MED ORDER — SODIUM CHLORIDE 0.9 % IV SOLN: Freq: Once | INTRAVENOUS | Status: AC

## 2018-06-12 MED ORDER — DEXTROSE 5 % IV SOLN
33.0000 mg/m2 | Freq: Once | INTRAVENOUS | Status: AC
  Administered 2018-06-12: 60 mg via INTRAVENOUS
  Filled 2018-06-12: qty 30

## 2018-06-12 MED ORDER — ONDANSETRON HCL 4 MG PO TABS: 4.0000 mg | ORAL_TABLET | Freq: Three times a day (TID) | ORAL | 1 refills | Status: DC | PRN

## 2018-06-12 NOTE — Progress Notes (Signed)
[place] reviewed and pt seen by Dr. Delton Coombes who approved pt for Kyprolis treatment today. Pt reports she is still taking her Revlemid and Prednisone as prescribed.   Alexandra Price tolerated Kyprolis infusion without incident or complaint. VSS upon completion of treatment. Port flushed and left accessed for use tomorrow. Pt discharged self ambulatory in satisfactory condition in presence of son.
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
## 2018-06-12 NOTE — Patient Instructions (Signed)
[facility] Discharge Instructions for Patients Receiving Chemotherapy   Beginning January 23rd 2017 lab work for the [facility] will be done in the  Main lab at [facility] on 1st floor. If you have a lab appointment with the [facility] please come in thru the  Main Entrance and check in at the main information desk   Today you received the following chemotherapy agents Kyprolis  To help prevent nausea and vomiting after your treatment, we encourage you to take your nausea medication If you develop nausea and vomiting, or diarrhea that is not controlled by your medication, call the clinic.  The clinic phone number is [phone]. Office hours are Monday-Friday 8:30am-5:00pm.  BELOW ARE SYMPTOMS THAT SHOULD BE REPORTED IMMEDIATELY:  *FEVER GREATER THAN 101.0 F  *CHILLS WITH OR WITHOUT FEVER  NAUSEA AND VOMITING THAT IS NOT CONTROLLED WITH YOUR NAUSEA MEDICATION  *UNUSUAL SHORTNESS OF BREATH  *UNUSUAL BRUISING OR BLEEDING  TENDERNESS IN MOUTH AND THROAT WITH OR WITHOUT PRESENCE OF ULCERS  *URINARY PROBLEMS  *BOWEL PROBLEMS  UNUSUAL RASH Items with * indicate a potential emergency and should be followed up as soon as possible. If you have an emergency after office hours please contact your primary care physician or go to the nearest emergency department.  Please call the clinic during office hours if you have any questions or concerns.   You may also contact the Patient Navigator at [phone] should you have any questions or need assistance in obtaining follow up care.      Resources For Cancer Patients and their Caregivers ? American Cancer Society: Can assist with transportation, wigs, general needs, runs Look Good Feel Better.        725-878-2656 ? Cancer Care: Provides financial assistance, online support groups, medication/co-pay assistance.  1-800-813-HOPE 937 104 0314) ? [place] Assists [place] cancer  patients and their families through emotional , educational and financial support.  9515727649 ? [place] DSS Where to apply for food stamps, Medicaid and utility assistance. 321 745 7538 ? [facility]: Transportation to medical appointments. 920-830-2574 ? Social Security Administration: May apply for disability if have a Stage IV cancer. 251-670-0503 7193057721 ? [employer], Disability and Transit Services: Assists with nutrition, care and transit needs. 712-288-0190

## 2018-06-12 NOTE — Assessment & Plan Note (Addendum)
.  1.  IgG lambda plasma cell myeloma, stage II by R-ISS: - Presentation to [facility] with left-sided rib pain on 12/29/2017, found to have hypercalcemia, anemia and acute kidney injury, further work-up showed 3.2 g/dL of IgG lambda monoclonal gammopathy. -Skeletal survey reportedly demonstrated diffuse osteopenia and multiple lytic lesions in bilateral lower extremities. -Bone marrow biopsy on 12/31/2017 showed hypercellular marrow with 40 to 50% cellularity, involved by plasma cell neoplasm (17% plasma cells by manual aspirate differential count, greater than 90% and 30 to 40% by CD138 immunohistochemical analysis of clot and core respectively, and monotypic lambda by flow cytometry analysis) - Chromosome analysis showed 12 of 20 cells examined are normal, 46, X6.  The remaining 8 metaphase cells analyzed are abnormal and represent a hyperdiploid clone containing 1 extra copy each of chromosome 3, 5, 6, 7, 9, 11, 15, 19, 20, 21, 22 as well as a marker chromosome. -Multiple myeloma FISH panel shows nuclei positive for three 1q signals, trisomy 11, and extra signals of chromosome 7, 9 and 15. - Patient was recommended to start chemotherapy with port placement by Dr. Federico Flake at [facility].  She was recommended to have IV chemotherapy as she only has Medicare part A. Patient did not follow-up at [facility] -On 04/07/2018, M spike was 4 g of IgG lambda.  LDH was 115.  Beta-2 microglobulin-12.8.  Light chain ratio is 0, Lambda light chains were elevated at 59,667. -Bone survey showed large expansile lytic lesion in the left shoulder, multiple lytic lesions in all bones. -First treatment with Arelia Sneddon was started on 04/17/2018. - Cycle 2 with KRd started on 05/15/2018.  Revlimid was 10 mg 3 weeks on 1 week off.  She received last dose of carfilzomib on 05/16/2018. - She presented to the ER on 05/20/2018 with abdominal pain and was diagnosed with appendicitis, underwent appendectomy on the same day. -She did have a  couple of admissions to the hospital after that.  She is currently pain-free. - I have reviewed CT scan of the abdomen and pelvis dated 06/10/2018 which did not show any bowel obstruction. - She may proceed with her cycle 2 starting today.  She already took Revlimid this morning.  She will take dexamethasone 20 mg today and 20 tomorrow.  2.  Difficulty falling and staying asleep: -She will continue Ambien 10 mg as needed.  3.  Hypercalcemia: - She had hypercalcemia presentation.  This is no longer a problem.  She is taking calcium 1 g daily.  Zometa was on 04/15/2018.  We will plan to put her back on Zometa.  4.  Anxiety: - She will take Xanax 0.25 mg twice daily as needed.   5.  Back pain: - She is taking hydrocodone 5 mg twice daily and tolerating it well.  Pain is well controlled.

## 2018-06-13 ENCOUNTER — Inpatient Hospital Stay (HOSPITAL_COMMUNITY): Payer: Medicaid Other

## 2018-06-13 ENCOUNTER — Encounter (HOSPITAL_COMMUNITY): Payer: Self-pay

## 2018-06-13 ENCOUNTER — Other Ambulatory Visit: Payer: Self-pay

## 2018-06-13 ENCOUNTER — Other Ambulatory Visit: Payer: Self-pay | Admitting: Pharmacist

## 2018-06-13 VITALS — BP 131/62 | HR 79 | Temp 97.9°F | Resp 18 | Wt 158.0 lb

## 2018-06-13 DIAGNOSIS — C9000 Multiple myeloma not having achieved remission: Secondary | ICD-10-CM

## 2018-06-13 DIAGNOSIS — Z5112 Encounter for antineoplastic immunotherapy: Secondary | ICD-10-CM | POA: Diagnosis not present

## 2018-06-13 DIAGNOSIS — F419 Anxiety disorder, unspecified: Secondary | ICD-10-CM

## 2018-06-13 LAB — KAPPA/LAMBDA LIGHT CHAINS
Kappa free light chain: 13 mg/L (ref 3.3–19.4)
Kappa, lambda light chain ratio: 0.15 — ABNORMAL LOW (ref 0.26–1.65)
LAMDA FREE LIGHT CHAINS: 86.4 mg/L — AB (ref 5.7–26.3)

## 2018-06-13 MED ORDER — HEPARIN SOD (PORK) LOCK FLUSH 100 UNIT/ML IV SOLN
INTRAVENOUS | Status: AC
  Filled 2018-06-13: qty 10

## 2018-06-13 MED ORDER — DEXTROSE 5 % IV SOLN
33.0000 mg/m2 | Freq: Once | INTRAVENOUS | Status: AC
  Administered 2018-06-13: 60 mg via INTRAVENOUS
  Filled 2018-06-13: qty 30

## 2018-06-13 MED ORDER — SODIUM CHLORIDE 0.9 % IV SOLN
Freq: Once | INTRAVENOUS | Status: AC
  Administered 2018-06-13: 11:00:00 via INTRAVENOUS

## 2018-06-13 MED ORDER — SODIUM CHLORIDE 0.9% FLUSH
10.0000 mL | INTRAVENOUS | Status: DC | PRN
  Administered 2018-06-13: 10 mL
  Filled 2018-06-13: qty 10

## 2018-06-13 MED ORDER — ALPRAZOLAM 0.25 MG PO TABS: 0.2500 mg | ORAL_TABLET | Freq: Two times a day (BID) | ORAL | 0 refills | Status: DC | PRN

## 2018-06-13 MED ORDER — HEPARIN SOD (PORK) LOCK FLUSH 100 UNIT/ML IV SOLN
500.0000 [IU] | Freq: Once | INTRAVENOUS | Status: AC | PRN
  Administered 2018-06-13: 500 [IU]

## 2018-06-13 NOTE — Patient Instructions (Signed)
[facility] Discharge Instructions for Patients Receiving Chemotherapy  Today you received the following chemotherapy agents   To help prevent nausea and vomiting after your treatment, we encourage you to take your nausea medication   If you develop nausea and vomiting that is not controlled by your nausea medication, call the clinic.   BELOW ARE SYMPTOMS THAT SHOULD BE REPORTED IMMEDIATELY:  *FEVER GREATER THAN 100.5 F  *CHILLS WITH OR WITHOUT FEVER  NAUSEA AND VOMITING THAT IS NOT CONTROLLED WITH YOUR NAUSEA MEDICATION  *UNUSUAL SHORTNESS OF BREATH  *UNUSUAL BRUISING OR BLEEDING  TENDERNESS IN MOUTH AND THROAT WITH OR WITHOUT PRESENCE OF ULCERS  *URINARY PROBLEMS  *BOWEL PROBLEMS  UNUSUAL RASH Items with * indicate a potential emergency and should be followed up as soon as possible.  Feel free to call the clinic should you have any questions or concerns. The clinic phone number is [phone].  Please show the CHEMO ALERT CARD at check-in to the Emergency Department and triage nurse.   

## 2018-06-13 NOTE — Progress Notes (Signed)
Pt requests Xanax order due to pt states she only has 3 pills left. After viewing MAR, med discontinued by Dr. Ulice Bold from a hospital discharge. Reported to Dr. Delton Coombes pt requests order to be placed. VO received by Dr. Delton Coombes and MD placed order at this time for pt and sent electronically via computer to [facility] in [place]. VSS. No complaints today. Proceed with treatment.   Treatment given today per MD orders. Tolerated infusion without adverse affects. Vital signs stable. No complaints at this time. Discharged from clinic ambulatory. F/U with [facility] as scheduled.

## 2018-06-13 NOTE — Progress Notes (Signed)
Nutrition Follow-up:  Patient with multiple myeloma.  Noted recent hospital admissions s/p appendectomy.  Met with patient during infusion this pm.  Patient reports that her appetite has been fairly good, taking steriods recently.  "I had to cut back on taking the ensure because of my high blood sugar."  "I am going to start drinking them again."  Denies nutrition impact symptoms at this time  Medications: reviewed  Labs: Na 132, BUN 42, creatinine 2.10  Anthropometrics:   Weight slight decrease today at 158 lb from 159 lb 6.4 oz on 9/27.  Noted 162 lb on 9/13   NUTRITION DIAGNOSIS:  Inadequate oral intake continues   MALNUTRITION DIAGNOSIS: none at this time   INTERVENTION:  Encouraged high calorie, high protein foods to maintain weight during treatment Encouraged oral nutrition supplements shakes (higher calorie preferred).  Would recommend medication adjustment for better blood glucose control vs restricting intake     MONITORING, EVALUATION, GOAL: weight trends, intake   NEXT VISIT: November 22nd during infusion  Wyett Narine B. Zenia Resides, [place], [place] Registered Dietitian [phone] (pager)

## 2018-06-16 ENCOUNTER — Other Ambulatory Visit (HOSPITAL_COMMUNITY): Payer: Self-pay | Admitting: *Deleted

## 2018-06-16 DIAGNOSIS — R42 Dizziness and giddiness: Secondary | ICD-10-CM

## 2018-06-16 LAB — PROTEIN ELECTROPHORESIS, SERUM
A/G RATIO SPE: 1 (ref 0.7–1.7)
Albumin ELP: 3.1 g/dL (ref 2.9–4.4)
Alpha-1-Globulin: 0.2 g/dL (ref 0.0–0.4)
Alpha-2-Globulin: 1 g/dL (ref 0.4–1.0)
BETA GLOBULIN: 0.9 g/dL (ref 0.7–1.3)
GLOBULIN, TOTAL: 3.1 g/dL (ref 2.2–3.9)
Gamma Globulin: 0.9 g/dL (ref 0.4–1.8)
M-Spike, %: 0.6 g/dL — ABNORMAL HIGH
TOTAL PROTEIN ELP: 6.2 g/dL (ref 6.0–8.5)

## 2018-06-16 MED ORDER — LISINOPRIL 10 MG PO TABS: 10.0000 mg | ORAL_TABLET | Freq: Every day | ORAL | 0 refills | Status: DC

## 2018-06-16 MED ORDER — PANTOPRAZOLE SODIUM 40 MG PO TBEC: 40.0000 mg | DELAYED_RELEASE_TABLET | Freq: Two times a day (BID) | ORAL | 0 refills | Status: DC

## 2018-06-18 ENCOUNTER — Encounter (HOSPITAL_COMMUNITY): Payer: Self-pay | Admitting: Hematology

## 2018-06-18 ENCOUNTER — Inpatient Hospital Stay (HOSPITAL_COMMUNITY): Payer: Medicaid Other

## 2018-06-18 ENCOUNTER — Other Ambulatory Visit (HOSPITAL_COMMUNITY): Payer: Self-pay | Admitting: Nurse Practitioner

## 2018-06-18 ENCOUNTER — Inpatient Hospital Stay (HOSPITAL_BASED_OUTPATIENT_CLINIC_OR_DEPARTMENT_OTHER): Payer: Medicaid Other | Admitting: Hematology

## 2018-06-18 ENCOUNTER — Other Ambulatory Visit: Payer: Self-pay

## 2018-06-18 ENCOUNTER — Encounter (HOSPITAL_COMMUNITY): Payer: Self-pay

## 2018-06-18 DIAGNOSIS — M549 Dorsalgia, unspecified: Secondary | ICD-10-CM | POA: Diagnosis not present

## 2018-06-18 DIAGNOSIS — F419 Anxiety disorder, unspecified: Secondary | ICD-10-CM | POA: Diagnosis not present

## 2018-06-18 DIAGNOSIS — C9000 Multiple myeloma not having achieved remission: Secondary | ICD-10-CM

## 2018-06-18 DIAGNOSIS — Z5112 Encounter for antineoplastic immunotherapy: Secondary | ICD-10-CM | POA: Diagnosis not present

## 2018-06-18 LAB — COMPREHENSIVE METABOLIC PANEL
ALBUMIN: 2.9 g/dL — AB (ref 3.5–5.0)
ALK PHOS: 110 U/L (ref 38–126)
ALT: 19 U/L (ref 0–44)
AST: 11 U/L — AB (ref 15–41)
Anion gap: 6 (ref 5–15)
BUN: 32 mg/dL — AB (ref 8–23)
CALCIUM: 7.9 mg/dL — AB (ref 8.9–10.3)
CHLORIDE: 98 mmol/L (ref 98–111)
CO2: 26 mmol/L (ref 22–32)
CREATININE: 2.92 mg/dL — AB (ref 0.44–1.00)
GFR calc Af Amer: 18 mL/min — ABNORMAL LOW (ref >60)
GFR calc non Af Amer: 15 mL/min — ABNORMAL LOW (ref >60)
GLUCOSE: 168 mg/dL — AB (ref 70–99)
Potassium: 5.1 mmol/L (ref 3.5–5.1)
SODIUM: 130 mmol/L — AB (ref 135–145)
Total Bilirubin: 0.8 mg/dL (ref 0.3–1.2)
Total Protein: 5.8 g/dL — ABNORMAL LOW (ref 6.5–8.1)

## 2018-06-18 LAB — CBC WITH DIFFERENTIAL/PLATELET
Abs Immature Granulocytes: 0.05 10*3/uL (ref 0.00–0.07)
BASOS ABS: 0 10*3/uL (ref 0.0–0.1)
Basophils Relative: 0 %
EOS PCT: 52 %
Eosinophils Absolute: 3.9 10*3/uL — ABNORMAL HIGH (ref 0.0–0.5)
HEMATOCRIT: 29.8 % — AB (ref 36.0–46.0)
HEMOGLOBIN: 9.4 g/dL — AB (ref 12.0–15.0)
Immature Granulocytes: 1 %
LYMPHS ABS: 0.4 10*3/uL — AB (ref 0.7–4.0)
LYMPHS PCT: 6 %
MCH: 31 pg (ref 26.0–34.0)
MCHC: 31.5 g/dL (ref 30.0–36.0)
MCV: 98.3 fL (ref 80.0–100.0)
MONO ABS: 0.4 10*3/uL (ref 0.1–1.0)
MONOS PCT: 5 %
NRBC: 0 % (ref 0.0–0.2)
Neutro Abs: 2.7 10*3/uL (ref 1.7–7.7)
Neutrophils Relative %: 36 %
Platelets: 140 10*3/uL — ABNORMAL LOW (ref 150–400)
RBC: 3.03 MIL/uL — ABNORMAL LOW (ref 3.87–5.11)
RDW: 17.4 % — ABNORMAL HIGH (ref 11.5–15.5)
WBC: 7.4 10*3/uL (ref 4.0–10.5)

## 2018-06-18 MED ORDER — HEPARIN SOD (PORK) LOCK FLUSH 100 UNIT/ML IV SOLN
500.0000 [IU] | Freq: Once | INTRAVENOUS | Status: AC
  Administered 2018-06-18: 500 [IU] via INTRAVENOUS

## 2018-06-18 MED ORDER — METHYLPREDNISOLONE SODIUM SUCC 125 MG IJ SOLR
125.0000 mg | Freq: Once | INTRAMUSCULAR | Status: AC
  Administered 2018-06-18: 125 mg via INTRAVENOUS

## 2018-06-18 MED ORDER — FAMOTIDINE IN NACL 20-0.9 MG/50ML-% IV SOLN
20.0000 mg | Freq: Once | INTRAVENOUS | Status: AC
  Administered 2018-06-18: 20 mg via INTRAVENOUS

## 2018-06-18 MED ORDER — PREDNISONE 20 MG PO TABS: 60.0000 mg | ORAL_TABLET | Freq: Every day | ORAL | 0 refills | Status: DC

## 2018-06-18 MED ORDER — METHYLPREDNISOLONE SODIUM SUCC 125 MG IJ SOLR
INTRAMUSCULAR | Status: AC
  Filled 2018-06-18: qty 2

## 2018-06-18 MED ORDER — DIPHENHYDRAMINE HCL 50 MG/ML IJ SOLN
INTRAMUSCULAR | Status: AC
  Filled 2018-06-18: qty 1

## 2018-06-18 MED ORDER — SODIUM CHLORIDE 0.9 % IV SOLN
INTRAVENOUS | Status: DC
  Administered 2018-06-18: 14:00:00 via INTRAVENOUS

## 2018-06-18 MED ORDER — FAMOTIDINE IN NACL 20-0.9 MG/50ML-% IV SOLN
INTRAVENOUS | Status: AC
  Filled 2018-06-18: qty 50

## 2018-06-18 MED ORDER — DIPHENHYDRAMINE HCL 50 MG/ML IJ SOLN
25.0000 mg | Freq: Once | INTRAMUSCULAR | Status: AC
  Administered 2018-06-18: 25 mg via INTRAVENOUS

## 2018-06-18 MED ORDER — SODIUM CHLORIDE 0.9% FLUSH
10.0000 mL | INTRAVENOUS | Status: DC | PRN
  Administered 2018-06-18: 10 mL via INTRAVENOUS
  Filled 2018-06-18: qty 10

## 2018-06-18 NOTE — Progress Notes (Signed)
1400 Pt seen by Dr. Delton Coombes and orders obtained for hydration 1L NS over 2 hours with Benadryl 25 mg,Pepcid 20 mg and Solumedrol 125 mg IV per MD.                                                   Alexandra Price tolerated hydration well without complaints or incident. VSS upon discharge. Pt discharged via wheelchair in satisfactory condition accompanied by her husband

## 2018-06-18 NOTE — Patient Instructions (Signed)
You were seen today by Dr. Delton Coombes.

## 2018-06-18 NOTE — Progress Notes (Signed)
Patient presents today without appointment. Patient c/o rash that started about 3 weeks ago. She states that she has been using hydrocortisone cream and benadryl without relief.  Patient does have rash and her arms are swollen. Rash is red/raised/blisters on hands, arms, back, legs, feet.  Patient was made an appointment and Dr. Delton Coombes will see her today to address the rash.

## 2018-06-18 NOTE — Assessment & Plan Note (Signed)
.1.  IgG lambda plasma cell myeloma, stage II by R-ISS: - Presentation to [facility] with left-sided rib pain on 12/29/2017, found to have hypercalcemia, anemia and acute kidney injury, further work-up showed 3.2 g/dL of IgG lambda monoclonal gammopathy. -Skeletal survey reportedly demonstrated diffuse osteopenia and multiple lytic lesions in bilateral lower extremities. -Bone marrow biopsy on 12/31/2017 showed hypercellular marrow with 40 to 50% cellularity, involved by plasma cell neoplasm (17% plasma cells by manual aspirate differential count, greater than 90% and 30 to 40% by CD138 immunohistochemical analysis of clot and core respectively, and monotypic lambda by flow cytometry analysis) - Chromosome analysis showed 12 of 20 cells examined are normal, 46, X6.  The remaining 8 metaphase cells analyzed are abnormal and represent a hyperdiploid clone containing 1 extra copy each of chromosome 3, 5, 6, 7, 9, 11, 15, 19, 20, 21, 22 as well as a marker chromosome. -Multiple myeloma FISH panel shows nuclei positive for three 1q signals, trisomy 11, and extra signals of chromosome 7, 9 and 15. - Patient was recommended to start chemotherapy with port placement by Dr. Federico Flake at [facility].  She was recommended to have IV chemotherapy as she only has Medicare part A. Patient did not follow-up at [facility] -On 04/07/2018, M spike was 4 g of IgG lambda.  LDH was 115.  Beta-2 microglobulin-12.8.  Light chain ratio is 0, Lambda light chains were elevated at 59,667. -Bone survey showed large expansile lytic lesion in the left shoulder, multiple lytic lesions in all bones. -First treatment with Arelia Sneddon was started on 04/17/2018. - Cycle 2 with KRd started on 05/15/2018.  Revlimid was 10 mg 3 weeks on 1 week off.  She received last dose of carfilzomib on 05/16/2018. - She presented to the ER on 05/20/2018 with abdominal pain and was diagnosed with appendicitis, underwent appendectomy on the same day. -She did have a  couple of admissions to the hospital after that.  She is currently pain-free. - I have reviewed CT scan of the abdomen and pelvis dated 06/10/2018 which did not show any bowel obstruction. - She was started on cycle 2 KRD on 06/12/2018 and 06/13/2018. -She is seen on unscheduled visit today as she developed diffuse erythematous maculopapular rash on the trunk and extremities.  This is itching.  She reports that she had this rash since the weekend.  She is taking Benadryl. - Examination showed diffuse erythematous maculopapular rash predominantly on the extremities and on the trunk.  No bullae seen.  No oropharyngeal lesions.  Lesions were seen on the palms. - I have given her 125 mg of Solu-Medrol in the office.  We have also given 1 L of normal saline as her creatinine was elevated.  She was also given some Benadryl and Pepcid IV.  We have called in a prescription for prednisone 60 mg for 5 days.  I will reevaluate her tomorrow. - The rash is most likely from Revlimid.  She only took 2 doses of Revlimid on 06/12/2018 and 06/13/2018.  2.  Difficulty falling and staying asleep: -She will continue Ambien 10 mg as needed.  3.  Hypercalcemia: - She had hypercalcemia presentation.  This is no longer a problem.  She is taking calcium 1 g daily.  Zometa was on 04/15/2018.  We will plan to put her back on Zometa.  4.  Anxiety: - She will take Xanax 0.25 mg twice daily as needed.   5.  Back pain: - She is taking hydrocodone 5 mg twice daily and  tolerating it well.  Pain is well controlled.
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
## 2018-06-18 NOTE — Patient Instructions (Signed)
[place] at [facility] Discharge Instructions  Received 2 hours of hydration with Benadryl,pepcid and Solumedrol. Follow-up as scheduled. Call clinic for any questions or concerns   Thank you for choosing [place] at [facility] to provide your oncology and hematology care.  To afford each patient quality time with our provider, please arrive at least 15 minutes before your scheduled appointment time.   If you have a lab appointment with the [facility] please come in thru the  Main Entrance and check in at the main information desk  You need to re-schedule your appointment should you arrive 10 or more minutes late.  We strive to give you quality time with our providers, and arriving late affects you and other patients whose appointments are after yours.  Also, if you no show three or more times for appointments you may be dismissed from the clinic at the providers discretion.     Again, thank you for choosing [facility].  Our hope is that these requests will decrease the amount of time that you wait before being seen by our physicians.       _____________________________________________________________  Should you have questions after your visit to [facility], please contact our office at [phone] between the hours of 8:00 a.m. and 4:30 p.m.  Voicemails left after 4:00 p.m. will not be returned until the following business day.  For prescription refill requests, have your pharmacy contact our office and allow 72 hours.    Cancer Center Support Programs:   > Cancer Support Group  2nd Tuesday of the month 1pm-2pm, Journey Room

## 2018-06-19 ENCOUNTER — Other Ambulatory Visit (HOSPITAL_COMMUNITY): Payer: Self-pay

## 2018-06-19 ENCOUNTER — Inpatient Hospital Stay (HOSPITAL_COMMUNITY): Payer: Medicaid Other

## 2018-06-19 ENCOUNTER — Other Ambulatory Visit (HOSPITAL_COMMUNITY): Payer: Self-pay | Admitting: Pharmacist

## 2018-06-19 ENCOUNTER — Ambulatory Visit (HOSPITAL_COMMUNITY): Payer: Self-pay | Admitting: Hematology

## 2018-06-19 VITALS — BP 130/60 | HR 52 | Temp 98.2°F | Resp 16

## 2018-06-19 DIAGNOSIS — C9000 Multiple myeloma not having achieved remission: Secondary | ICD-10-CM

## 2018-06-19 DIAGNOSIS — Z5112 Encounter for antineoplastic immunotherapy: Secondary | ICD-10-CM | POA: Diagnosis not present

## 2018-06-19 LAB — BASIC METABOLIC PANEL
Anion gap: 12 (ref 5–15)
BUN: 36 mg/dL — AB (ref 8–23)
CHLORIDE: 102 mmol/L (ref 98–111)
CO2: 19 mmol/L — ABNORMAL LOW (ref 22–32)
CREATININE: 2.93 mg/dL — AB (ref 0.44–1.00)
Calcium: 7.8 mg/dL — ABNORMAL LOW (ref 8.9–10.3)
GFR calc Af Amer: 18 mL/min — ABNORMAL LOW (ref >60)
GFR calc non Af Amer: 15 mL/min — ABNORMAL LOW (ref >60)
GLUCOSE: 309 mg/dL — AB (ref 70–99)
Potassium: 4.4 mmol/L (ref 3.5–5.1)
SODIUM: 133 mmol/L — AB (ref 135–145)

## 2018-06-19 MED ORDER — SODIUM CHLORIDE 0.9 % IV SOLN
Freq: Once | INTRAVENOUS | Status: AC
  Administered 2018-06-19: 10:00:00 via INTRAVENOUS

## 2018-06-19 MED ORDER — GLIPIZIDE ER 5 MG PO TB24: 5.0000 mg | ORAL_TABLET | Freq: Every day | ORAL | 0 refills | Status: DC

## 2018-06-19 NOTE — Progress Notes (Signed)
Labs reviewed with MD today. Per MD, hold treatment until next week. Hydration given today only. VO received from Dr. Genene Churn to change meds from Metformin to Glipizide 5mg  with breakfast.    Hydration fluids complete. Vital signs stable. No complaints at this time. Discharged from clinic ambulatory. F/U with [facility] as scheduled. Appt times given. Pt given copy of Medication list and pt instructed go by pharmacy and get new med for blood sugar.
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
## 2018-06-19 NOTE — Patient Instructions (Signed)
[facility]  Discharge Instructions:   _______________________________________________________________  Thank you for choosing [facility] to provide your oncology and hematology care.  To afford each patient quality time with our providers, please arrive at least 15 minutes before your scheduled appointment.  You need to re-schedule your appointment if you arrive 10 or more minutes late.  We strive to give you quality time with our providers, and arriving late affects you and other patients whose appointments are after yours.  Also, if you no show three or more times for appointments you may be dismissed from the clinic.  Again, thank you for choosing [facility]. Our hope is that these requests will allow you access to exceptional care and in a timely manner. _______________________________________________________________  If you have questions after your visit, please contact our office at [phone] between the hours of 8:30 a.m. and 5:00 p.m. Voicemails left after 4:30 p.m. will not be returned until the following business day. _______________________________________________________________  For prescription refill requests, have your pharmacy contact our office. _______________________________________________________________  Recommendations made by the consultant and any test results will be sent to your referring physician. _______________________________________________________________ 

## 2018-06-20 ENCOUNTER — Ambulatory Visit (HOSPITAL_COMMUNITY): Payer: Self-pay

## 2018-06-20 MED ORDER — PEGFILGRASTIM-CBQV 6 MG/0.6ML ~~LOC~~ SOSY
PREFILLED_SYRINGE | SUBCUTANEOUS | Status: AC
  Filled 2018-06-20: qty 0.6

## 2018-06-20 MED ORDER — HEPARIN SOD (PORK) LOCK FLUSH 100 UNIT/ML IV SOLN
INTRAVENOUS | Status: AC
  Filled 2018-06-20: qty 30

## 2018-06-21 ENCOUNTER — Other Ambulatory Visit: Payer: Self-pay

## 2018-06-21 ENCOUNTER — Emergency Department (HOSPITAL_COMMUNITY)
Admission: EM | Admit: 2018-06-21 | Discharge: 2018-06-21 | Disposition: A | Discharge status: Home / Self Care | Payer: Medicaid Other | Attending: Emergency Medicine | Admitting: Emergency Medicine

## 2018-06-21 ENCOUNTER — Encounter (HOSPITAL_COMMUNITY): Payer: Self-pay

## 2018-06-21 DIAGNOSIS — Z79899 Other long term (current) drug therapy: Secondary | ICD-10-CM | POA: Insufficient documentation

## 2018-06-21 DIAGNOSIS — Z7982 Long term (current) use of aspirin: Secondary | ICD-10-CM | POA: Diagnosis not present

## 2018-06-21 DIAGNOSIS — E875 Hyperkalemia: Secondary | ICD-10-CM | POA: Diagnosis not present

## 2018-06-21 DIAGNOSIS — I129 Hypertensive chronic kidney disease with stage 1 through stage 4 chronic kidney disease, or unspecified chronic kidney disease: Secondary | ICD-10-CM | POA: Diagnosis not present

## 2018-06-21 DIAGNOSIS — R739 Hyperglycemia, unspecified: Secondary | ICD-10-CM

## 2018-06-21 DIAGNOSIS — Z7984 Long term (current) use of oral hypoglycemic drugs: Secondary | ICD-10-CM | POA: Diagnosis not present

## 2018-06-21 DIAGNOSIS — N184 Chronic kidney disease, stage 4 (severe): Secondary | ICD-10-CM | POA: Insufficient documentation

## 2018-06-21 DIAGNOSIS — R21 Rash and other nonspecific skin eruption: Secondary | ICD-10-CM | POA: Insufficient documentation

## 2018-06-21 LAB — URINALYSIS, ROUTINE W REFLEX MICROSCOPIC
BILIRUBIN URINE: NEGATIVE
Bacteria, UA: NONE SEEN
HGB URINE DIPSTICK: NEGATIVE
KETONES UR: NEGATIVE mg/dL
NITRITE: NEGATIVE
PROTEIN: NEGATIVE mg/dL
Specific Gravity, Urine: 1.011 (ref 1.005–1.030)
pH: 5 (ref 5.0–8.0)

## 2018-06-21 LAB — BASIC METABOLIC PANEL
Anion gap: 7 (ref 5–15)
BUN: 35 mg/dL — ABNORMAL HIGH (ref 8–23)
CO2: 20 mmol/L — ABNORMAL LOW (ref 22–32)
Calcium: 7.5 mg/dL — ABNORMAL LOW (ref 8.9–10.3)
Chloride: 105 mmol/L (ref 98–111)
Creatinine, Ser: 2.66 mg/dL — ABNORMAL HIGH (ref 0.44–1.00)
GFR calc non Af Amer: 17 mL/min — ABNORMAL LOW (ref >60)
GFR, EST AFRICAN AMERICAN: 20 mL/min — AB (ref >60)
GLUCOSE: 436 mg/dL — AB (ref 70–99)
POTASSIUM: 5.9 mmol/L — AB (ref 3.5–5.1)
Sodium: 132 mmol/L — ABNORMAL LOW (ref 135–145)

## 2018-06-21 LAB — CBC
HEMATOCRIT: 25.6 % — AB (ref 36.0–46.0)
Hemoglobin: 8 g/dL — ABNORMAL LOW (ref 12.0–15.0)
MCH: 31 pg (ref 26.0–34.0)
MCHC: 31.3 g/dL (ref 30.0–36.0)
MCV: 99.2 fL (ref 80.0–100.0)
Platelets: 240 10*3/uL (ref 150–400)
RBC: 2.58 MIL/uL — ABNORMAL LOW (ref 3.87–5.11)
RDW: 17.7 % — AB (ref 11.5–15.5)
WBC: 2.5 10*3/uL — AB (ref 4.0–10.5)
nRBC: 0 % (ref 0.0–0.2)

## 2018-06-21 LAB — CBG MONITORING, ED: Glucose-Capillary: 402 mg/dL — ABNORMAL HIGH (ref 70–99)

## 2018-06-21 MED ORDER — SODIUM CHLORIDE 0.9 % IV BOLUS
1000.0000 mL | Freq: Once | INTRAVENOUS | Status: AC
  Administered 2018-06-21: 1000 mL via INTRAVENOUS

## 2018-06-21 NOTE — Discharge Instructions (Addendum)
As discussed, it is important that you monitor your condition carefully and be sure to follow-up with your physician on Monday. Please be sure to have your labs checked, to ensure that your condition is continued to improve.  Return here for concerning changes in your condition.

## 2018-06-21 NOTE — ED Provider Notes (Signed)
[facility] EMERGENCY DEPARTMENT Provider Note   CSN: [id number] Arrival date & time: 06/21/18  1843     History   Chief Complaint Chief Complaint  Patient presents with  . Hyperglycemia    HPI Alexandra Price is a 73 y.o. female.  HPI Patient with a history of multiple myeloma presents with concern of hyperglycemia, swelling, rash. Patient has multiple medical issues, including multiple myeloma, as above, hypertension, but does not have a history of diabetes.  On however, the patient was on a course of steroids within the past 2 months, developed hyperglycemia following that. She has been on metformin, but switched to glipizide yesterday. Last chemotherapy for multiple myeloma was 9 days ago, with a new regimen, new therapeutic agent. She notes that she developed a rash, swelling, but over the past 2 days the swelling has been more pronounced, diffuse. No new chest pain, dyspnea, nausea, vomiting, syncope. No additional recent steroids. After speaking with the staff at the oncology center today, reporting her blood glucose greater than 500, she was sent here for evaluation.  Past Medical History:  Diagnosis Date  . Anxiety   . Depression   . Hypertension   . Multiple myeloma ([place])    multiple myeloma    Patient Active Problem List   Diagnosis Date Noted  . Chest pain 06/04/2018  . Depression 05/29/2018  . Hyperglycemia 05/28/2018  . HTN (hypertension) 05/28/2018  . Anxiety 05/28/2018  . Rash 05/27/2018  . Pseudomonas infection 05/26/2018  . SOB (shortness of breath) 05/24/2018  . CAP (community acquired pneumonia) 05/23/2018  . Fever 05/22/2018  . Sinus tachycardia 05/22/2018  . CKD (chronic kidney disease) stage 4, GFR 15-29 ml/min (HCC) 05/22/2018  . S/P laparoscopic appendectomy 05/20/2018  . Multiple myeloma without remission ([place]) 04/15/2018  . Goals of care, counseling/discussion 04/15/2018  . Multiple myeloma not having achieved remission ([place])  04/07/2018    Past Surgical History:  Procedure Laterality Date  . ABDOMINAL HYSTERECTOMY     total  . APPENDECTOMY    . LAPAROSCOPIC APPENDECTOMY N/A 05/20/2018   Procedure: APPENDECTOMY LAPAROSCOPIC;  Surgeon: [name], MD;  Location: [facility];  Service: General;  Laterality: N/A;  . PORTACATH PLACEMENT Right 04/14/2018   Procedure: INSERTION PORT-A-CATH;  Surgeon: [name], MD;  Location: [facility];  Service: General;  Laterality: Right;     OB History   None      Home Medications    Prior to Admission medications   Medication Sig Start Date End Date Taking? Authorizing Provider  acyclovir (ZOVIRAX) 400 MG tablet Take 1 tablet (400 mg total) by mouth 2 (two) times daily. 05/23/18  Yes Johnson, Clanford L, MD  allopurinol (ZYLOPRIM) 300 MG tablet Take 1 tablet (300 mg total) by mouth daily. 04/24/18  Yes [name], MD  ALPRAZolam Duanne Moron) 0.25 MG tablet Take 1 tablet (0.25 mg total) by mouth 2 (two) times daily as needed for anxiety. 06/13/18  Yes [name], MD  aspirin EC 81 MG tablet Take 81 mg by mouth daily.   Yes [provider]  Calcium Carb-Cholecalciferol (CALCIUM 1000 + D PO) Take 1,000 mg by mouth daily. Take one tablet daily until next visit with oncologist.   Yes [provider]  citalopram (CELEXA) 20 MG tablet Take 1 tablet (20 mg total) by mouth daily. 05/30/18  Yes Kathie Dike, MD  famotidine (PEPCID) 20 MG tablet Take 1 tablet (20 mg total) by mouth 2 (two) times daily. 06/10/18  Yes Sherwood Gambler, MD  glipiZIDE (GLUCOTROL XL) 5 MG 24 hr tablet Take 1 tablet (5 mg total) by mouth daily with breakfast. 06/19/18  Yes [name], MD  HYDROcodone-acetaminophen (NORCO) 5-325 MG tablet Take 1 tablet by mouth 3 (three) times daily as needed for moderate pain. 06/06/18  Yes Lockamy, Randi L, NP-C  hydrocortisone cream 1 % Apply topically 2 (two) times daily. 05/29/18  Yes Kathie Dike, MD  hydrOXYzine  (ATARAX/VISTARIL) 25 MG tablet Take 1 tablet (25 mg total) by mouth every 6 (six) hours as needed for itching (rash). 05/31/18  Yes Milton Ferguson, MD  lidocaine-prilocaine (EMLA) cream Apply to affected area once 04/15/18  Yes [name], MD  lisinopril (PRINIVIL,ZESTRIL) 10 MG tablet Take 1 tablet (10 mg total) by mouth daily. 06/16/18  Yes [name], MD  nystatin cream (MYCOSTATIN) APPLY ONE APPLICATION BY TOPICAL ROUTE TWICE PER DAY FOR 7 DAYS 06/16/18  Yes [provider]  ondansetron (ZOFRAN) 4 MG tablet Take 1 tablet (4 mg total) by mouth every 8 (eight) hours as needed for nausea or vomiting. 06/12/18  Yes [name], MD  pantoprazole (PROTONIX) 40 MG tablet Take 1 tablet (40 mg total) by mouth 2 (two) times daily before a meal. 06/16/18  Yes [name], MD  polyethylene glycol (MIRALAX / GLYCOLAX) packet Take 17 g by mouth daily. 05/23/18 06/22/18 Yes Johnson, Clanford L, MD  predniSONE (DELTASONE) 20 MG tablet Take 3 tablets (60 mg total) by mouth daily with breakfast. 06/18/18  Yes Lockamy, Randi L, NP-C  prochlorperazine (COMPAZINE) 10 MG tablet Take 1 tablet (10 mg total) by mouth every 6 (six) hours as needed (Nausea or vomiting). 04/15/18  Yes [name], MD  trolamine salicylate (ASPERCREME) 10 % cream Apply 1 application topically daily as needed for muscle pain.   Yes [provider]  zolpidem (AMBIEN) 10 MG tablet TAKE 1 TABLET BY MOUTH AT BEDTIME AS NEEDED FOR SLEEP 06/10/18  Yes Higgs, Mathis Dad, MD  predniSONE (DELTASONE) 50 MG tablet Take 1 tablet (50 mg total) by mouth daily with breakfast. 05/30/18   Kathie Dike, MD    Family History Family History  Problem Relation Age of Onset  . Heart disease Mother   . Emphysema Father   . Diabetes Sister   . Cancer Brother        liver, lung, and colon  . Diabetes Brother     Social History Social History   Tobacco Use  . Smoking status: Never Smoker  .  Smokeless tobacco: Never Used  Substance Use Topics  . Alcohol use: Never    Frequency: Never  . Drug use: Not Currently     Allergies   Ciprofloxacin and Morphine and related   Review of Systems Review of Systems  Constitutional:       Per HPI, otherwise negative  HENT:       Per HPI, otherwise negative  Respiratory:       Per HPI, otherwise negative  Cardiovascular:       Per HPI, otherwise negative  Gastrointestinal: Negative for vomiting.  Endocrine:       Negative aside from HPI  Genitourinary:       Neg aside from HPI   Musculoskeletal:       Per HPI, otherwise negative  Skin: Positive for rash.  Allergic/Immunologic: Positive for immunocompromised state.  Neurological: Negative for syncope.     Physical Exam Updated Vital Signs BP (!) 159/82 (BP Location: Right Arm)   Pulse 94   Temp 98.3 F (  36.8 C) (Oral)   Resp 18   Ht 5' 6"  (1.676 m)   Wt 73.5 kg   SpO2 100%   BMI 26.15 kg/m   Physical Exam  Constitutional: She is oriented to person, place, and time. She appears well-developed and well-nourished. No distress.  HENT:  Head: Normocephalic and atraumatic.  Eyes: Conjunctivae and EOM are normal.  Cardiovascular: Normal rate and regular rhythm.  Pulmonary/Chest: Effort normal and breath sounds normal. No stridor. No respiratory distress.  Abdominal: She exhibits no distension.  Musculoskeletal: She exhibits edema.  Neurological: She is alert and oriented to person, place, and time. No cranial nerve deficit.  Skin: Skin is warm and dry. Rash noted.  Confluent erythematous slightly raised rash in multiple areas, most prominently in the extremities.   Psychiatric: She has a normal mood and affect.  Nursing note and vitals reviewed.    ED Treatments / Results  Labs (all labs ordered are listed, but only abnormal results are displayed) Labs Reviewed  BASIC METABOLIC PANEL - Abnormal; Notable for the following components:      Result Value    Sodium 132 (*)    Potassium 5.9 (*)    CO2 20 (*)    Glucose, Bld 436 (*)    BUN 35 (*)    Creatinine, Ser 2.66 (*)    Calcium 7.5 (*)    GFR calc non Af Amer 17 (*)    GFR calc Af Amer 20 (*)    All other components within normal limits  CBC - Abnormal; Notable for the following components:   WBC 2.5 (*)    RBC 2.58 (*)    Hemoglobin 8.0 (*)    HCT 25.6 (*)    RDW 17.7 (*)    All other components within normal limits  URINALYSIS, ROUTINE W REFLEX MICROSCOPIC - Abnormal; Notable for the following components:   Color, Urine STRAW (*)    Glucose, UA >=500 (*)    Leukocytes, UA TRACE (*)    All other components within normal limits  CBG MONITORING, ED - Abnormal; Notable for the following components:   Glucose-Capillary 402 (*)    All other components within normal limits    EKG EKG Interpretation  Date/Time:  Saturday June 21 2018 20:24:58 EDT Ventricular Rate:  76 PR Interval:    QRS Duration: 93 QT Interval:  383 QTC Calculation: 431 R Axis:   62 Text Interpretation:  Sinus rhythm ST-t wave abnormality Abnormal ekg Confirmed by Carmin Muskrat [phone]) on 06/21/2018 8:35:54 PM  Procedures Procedures (including critical care time)  Medications Ordered in ED Medications  sodium chloride 0.9 % bolus 1,000 mL (1,000 mLs Intravenous New Bag/Given 06/21/18 2030)     Initial Impression / Assessment and Plan / ED Course  I have reviewed the triage vital signs and the nursing notes.  Pertinent labs & imaging results that were available during my care of the patient were reviewed by me and considered in my medical decision making (see chart for details).     Initial labs notable for hyperkalemia, 5.9, though EKG does not have substantial T wave changes compared to prior. On repeat exam the patient is in similar condition, no additional complaints. We discussed these findings at length. Patient is receiving IV fluids.   9:36 PM Patient awake and alert, tolerating IV  fluids, nearly complete with her bolus. With a lengthy conversation about today's presentation, concern for iatrogenic hyperglycemia, she notes that she has 2 additional days of steroids prescribed  by her oncology team.  On she will complete these, and acknowledges understanding of return precautions, as well as importance of following up with her physician. Absent actual complaints on her part, with appropriate therapy for hyperkalemia without EKG changes complete, the patient is appropriate for discharge with outpatient follow-up.  Final Clinical Impressions(s) / ED Diagnoses  Hyperglycemia Hyperkalemia   Carmin Muskrat, MD 06/21/18 2138

## 2018-06-21 NOTE — ED Triage Notes (Signed)
Pt arrives POV from home c/o elevated blood sugar. Pt reports a blood sugar level of 515 at home.  Pt called [facility] and they advised Pt to seek treatment at ED. Pt denies N/V but states she does feel anxious.

## 2018-06-21 NOTE — ED Notes (Signed)
Pt presents with rash on all extremities stating it is a reaction to something, she is unsure about. Pt states it could be from a new Chemo medication she started last week. Pt presents with swelling in 4 extremities and states this is new since yesterday.

## 2018-06-23 ENCOUNTER — Ambulatory Visit (INDEPENDENT_AMBULATORY_CARE_PROVIDER_SITE_OTHER): Payer: Medicaid Other | Admitting: Psychiatry

## 2018-06-23 ENCOUNTER — Inpatient Hospital Stay (HOSPITAL_COMMUNITY): Payer: Medicaid Other | Attending: Hematology

## 2018-06-23 ENCOUNTER — Encounter (HOSPITAL_COMMUNITY): Payer: Self-pay | Admitting: *Deleted

## 2018-06-23 ENCOUNTER — Encounter (HOSPITAL_COMMUNITY): Payer: Self-pay | Admitting: Psychiatry

## 2018-06-23 VITALS — BP 152/77 | HR 96 | Ht 66.0 in | Wt 164.0 lb

## 2018-06-23 DIAGNOSIS — Z5112 Encounter for antineoplastic immunotherapy: Secondary | ICD-10-CM | POA: Diagnosis not present

## 2018-06-23 DIAGNOSIS — F331 Major depressive disorder, recurrent, moderate: Secondary | ICD-10-CM | POA: Diagnosis not present

## 2018-06-23 DIAGNOSIS — F419 Anxiety disorder, unspecified: Secondary | ICD-10-CM | POA: Diagnosis not present

## 2018-06-23 DIAGNOSIS — F4310 Post-traumatic stress disorder, unspecified: Secondary | ICD-10-CM

## 2018-06-23 DIAGNOSIS — C9000 Multiple myeloma not having achieved remission: Secondary | ICD-10-CM

## 2018-06-23 LAB — COMPREHENSIVE METABOLIC PANEL
ALBUMIN: 3.1 g/dL — AB (ref 3.5–5.0)
ALK PHOS: 89 U/L (ref 38–126)
ALT: 14 U/L (ref 0–44)
AST: 9 U/L — AB (ref 15–41)
Anion gap: 8 (ref 5–15)
BILIRUBIN TOTAL: 0.6 mg/dL (ref 0.3–1.2)
BUN: 35 mg/dL — AB (ref 8–23)
CALCIUM: 7.5 mg/dL — AB (ref 8.9–10.3)
CO2: 22 mmol/L (ref 22–32)
Chloride: 106 mmol/L (ref 98–111)
Creatinine, Ser: 2.12 mg/dL — ABNORMAL HIGH (ref 0.44–1.00)
GFR calc Af Amer: 26 mL/min — ABNORMAL LOW (ref >60)
GFR calc non Af Amer: 22 mL/min — ABNORMAL LOW (ref >60)
GLUCOSE: 157 mg/dL — AB (ref 70–99)
Potassium: 4.8 mmol/L (ref 3.5–5.1)
Sodium: 136 mmol/L (ref 135–145)
TOTAL PROTEIN: 5.7 g/dL — AB (ref 6.5–8.1)

## 2018-06-23 LAB — CBC WITH DIFFERENTIAL/PLATELET
ABS IMMATURE GRANULOCYTES: 0.05 10*3/uL (ref 0.00–0.07)
BASOS ABS: 0 10*3/uL (ref 0.0–0.1)
Basophils Relative: 1 %
Eosinophils Absolute: 1 10*3/uL — ABNORMAL HIGH (ref 0.0–0.5)
Eosinophils Relative: 20 %
HEMATOCRIT: 25.6 % — AB (ref 36.0–46.0)
HEMOGLOBIN: 8 g/dL — AB (ref 12.0–15.0)
IMMATURE GRANULOCYTES: 1 %
LYMPHS ABS: 0.5 10*3/uL — AB (ref 0.7–4.0)
LYMPHS PCT: 10 %
MCH: 31.4 pg (ref 26.0–34.0)
MCHC: 31.3 g/dL (ref 30.0–36.0)
MCV: 100.4 fL — ABNORMAL HIGH (ref 80.0–100.0)
Monocytes Absolute: 0.5 10*3/uL (ref 0.1–1.0)
Monocytes Relative: 9 %
NEUTROS ABS: 2.8 10*3/uL (ref 1.7–7.7)
NEUTROS PCT: 59 %
NRBC: 0 % (ref 0.0–0.2)
Platelets: 280 10*3/uL (ref 150–400)
RBC: 2.55 MIL/uL — ABNORMAL LOW (ref 3.87–5.11)
RDW: 17.6 % — ABNORMAL HIGH (ref 11.5–15.5)
WBC: 4.8 10*3/uL (ref 4.0–10.5)

## 2018-06-23 MED ORDER — CITALOPRAM HYDROBROMIDE 20 MG PO TABS: 20.0000 mg | ORAL_TABLET | Freq: Every day | ORAL | 0 refills | Status: DC

## 2018-06-23 NOTE — Patient Instructions (Addendum)
1. Continue citalopram 20 mg daily  2. Continue Xanax 0.25 mg twice a day as needed for anxiety 3. Return to clinic in one month for 30 mins 4. Referral to therapy CONTACT INFORMATION  What to do if you need to get in touch with someone regarding a psychiatric issue:  1. EMERGENCY: For psychiatric emergencies (if you are suicidal or if there are any other safety issues) call 911 and/or go to your nearest Emergency Room immediately.   2. IF YOU NEED SOMEONE TO TALK TO RIGHT NOW: Given my clinical responsibilities, I may not be able to speak with you over the phone for a prolonged period of time.  a. You may always call The National Suicide Prevention Lifeline at 1-800-273-TALK 7254009241).  b. Your county of residence will also have local crisis services. For [facility]: [place] at [phone] ([place])

## 2018-06-23 NOTE — Progress Notes (Signed)
Patient presented here at clinic today asking if she could have repeat lab work after her visit to the ER on Saturday. She was told to follow up with outpatient oncology.  Appointment made for lab work and we will call her with the results.

## 2018-06-26 ENCOUNTER — Encounter (HOSPITAL_COMMUNITY): Payer: Self-pay | Admitting: Hematology

## 2018-06-26 ENCOUNTER — Other Ambulatory Visit: Payer: Self-pay

## 2018-06-26 ENCOUNTER — Other Ambulatory Visit (HOSPITAL_COMMUNITY): Payer: Self-pay

## 2018-06-26 ENCOUNTER — Inpatient Hospital Stay (HOSPITAL_COMMUNITY): Payer: Medicaid Other

## 2018-06-26 ENCOUNTER — Inpatient Hospital Stay (HOSPITAL_BASED_OUTPATIENT_CLINIC_OR_DEPARTMENT_OTHER): Payer: Medicaid Other | Admitting: Hematology

## 2018-06-26 VITALS — BP 138/56 | HR 99 | Temp 98.6°F | Resp 18 | Wt 161.2 lb

## 2018-06-26 DIAGNOSIS — F419 Anxiety disorder, unspecified: Secondary | ICD-10-CM

## 2018-06-26 DIAGNOSIS — C9000 Multiple myeloma not having achieved remission: Secondary | ICD-10-CM | POA: Diagnosis not present

## 2018-06-26 DIAGNOSIS — Z5112 Encounter for antineoplastic immunotherapy: Secondary | ICD-10-CM | POA: Diagnosis not present

## 2018-06-26 LAB — COMPREHENSIVE METABOLIC PANEL
ALBUMIN: 3.2 g/dL — AB (ref 3.5–5.0)
ALT: 13 U/L (ref 0–44)
ANION GAP: 9 (ref 5–15)
AST: 11 U/L — ABNORMAL LOW (ref 15–41)
Alkaline Phosphatase: 90 U/L (ref 38–126)
BILIRUBIN TOTAL: 0.8 mg/dL (ref 0.3–1.2)
BUN: 26 mg/dL — ABNORMAL HIGH (ref 8–23)
CHLORIDE: 105 mmol/L (ref 98–111)
CO2: 24 mmol/L (ref 22–32)
Calcium: 7.9 mg/dL — ABNORMAL LOW (ref 8.9–10.3)
Creatinine, Ser: 1.89 mg/dL — ABNORMAL HIGH (ref 0.44–1.00)
GFR calc Af Amer: 30 mL/min — ABNORMAL LOW (ref >60)
GFR calc non Af Amer: 26 mL/min — ABNORMAL LOW (ref >60)
GLUCOSE: 124 mg/dL — AB (ref 70–99)
POTASSIUM: 4.4 mmol/L (ref 3.5–5.1)
Sodium: 138 mmol/L (ref 135–145)
TOTAL PROTEIN: 6 g/dL — AB (ref 6.5–8.1)

## 2018-06-26 LAB — CBC WITH DIFFERENTIAL/PLATELET
ABS IMMATURE GRANULOCYTES: 0.03 10*3/uL (ref 0.00–0.07)
BASOS PCT: 1 %
Basophils Absolute: 0 10*3/uL (ref 0.0–0.1)
Eosinophils Absolute: 2 10*3/uL — ABNORMAL HIGH (ref 0.0–0.5)
Eosinophils Relative: 42 %
HCT: 26.5 % — ABNORMAL LOW (ref 36.0–46.0)
HEMOGLOBIN: 8.2 g/dL — AB (ref 12.0–15.0)
IMMATURE GRANULOCYTES: 1 %
LYMPHS ABS: 0.6 10*3/uL — AB (ref 0.7–4.0)
LYMPHS PCT: 13 %
MCH: 31.8 pg (ref 26.0–34.0)
MCHC: 30.9 g/dL (ref 30.0–36.0)
MCV: 102.7 fL — ABNORMAL HIGH (ref 80.0–100.0)
MONOS PCT: 7 %
Monocytes Absolute: 0.3 10*3/uL (ref 0.1–1.0)
NEUTROS ABS: 1.8 10*3/uL (ref 1.7–7.7)
NEUTROS PCT: 36 %
Platelets: 335 10*3/uL (ref 150–400)
RBC: 2.58 MIL/uL — ABNORMAL LOW (ref 3.87–5.11)
RDW: 18.2 % — AB (ref 11.5–15.5)
WBC: 4.8 10*3/uL (ref 4.0–10.5)
nRBC: 0 % (ref 0.0–0.2)

## 2018-06-26 MED ORDER — SODIUM CHLORIDE 0.9 % IV SOLN
Freq: Once | INTRAVENOUS | Status: AC
  Administered 2018-06-26: 11:00:00 via INTRAVENOUS

## 2018-06-26 MED ORDER — DEXTROSE 5 % IV SOLN
33.0000 mg/m2 | Freq: Once | INTRAVENOUS | Status: AC
  Administered 2018-06-26: 60 mg via INTRAVENOUS
  Filled 2018-06-26: qty 30

## 2018-06-26 MED ORDER — HEPARIN SOD (PORK) LOCK FLUSH 100 UNIT/ML IV SOLN: 500.0000 [IU] | Freq: Once | INTRAVENOUS | Status: DC | PRN

## 2018-06-26 MED ORDER — SODIUM CHLORIDE 0.9% FLUSH
10.0000 mL | INTRAVENOUS | Status: DC | PRN
  Administered 2018-06-26: 10 mL
  Filled 2018-06-26: qty 10

## 2018-06-26 NOTE — Progress Notes (Signed)
Pt here today for treatment. Per Dr. Delton Coombes hold Revlamid . Labs reviewed at doctors visit. Proceed with treatment.  VSS.    Treatment given today per MD orders. Tolerated infusion without adverse affects. Vital signs stable. No complaints at this time. Discharged from clinic ambulatory. F/U with [facility] as scheduled.

## 2018-06-26 NOTE — Progress Notes (Signed)
Referred patient to [facility] for bone marrow transplant consult.  Appointment with Dr. Tiana Loft 08/04/18 @ 1:00.  New patient packet to be mailed to patient from [facility], per [facility].  Records available in Care Everywhere.

## 2018-06-26 NOTE — Patient Instructions (Signed)
[facility] Discharge Instructions for Patients Receiving Chemotherapy  Today you received the following chemotherapy agents   To help prevent nausea and vomiting after your treatment, we encourage you to take your nausea medication   If you develop nausea and vomiting that is not controlled by your nausea medication, call the clinic.   BELOW ARE SYMPTOMS THAT SHOULD BE REPORTED IMMEDIATELY:  *FEVER GREATER THAN 100.5 F  *CHILLS WITH OR WITHOUT FEVER  NAUSEA AND VOMITING THAT IS NOT CONTROLLED WITH YOUR NAUSEA MEDICATION  *UNUSUAL SHORTNESS OF BREATH  *UNUSUAL BRUISING OR BLEEDING  TENDERNESS IN MOUTH AND THROAT WITH OR WITHOUT PRESENCE OF ULCERS  *URINARY PROBLEMS  *BOWEL PROBLEMS  UNUSUAL RASH Items with * indicate a potential emergency and should be followed up as soon as possible.  Feel free to call the clinic should you have any questions or concerns. The clinic phone number is [phone].  Please show the CHEMO ALERT CARD at check-in to the Emergency Department and triage nurse.   

## 2018-06-26 NOTE — Patient Instructions (Signed)
[facility] at [facility] Discharge Instructions     Thank you for choosing [facility] at [facility] to provide your oncology and hematology care.  To afford each patient quality time with our provider, please arrive at least 15 minutes before your scheduled appointment time.   If you have a lab appointment with the Cancer Center please come in thru the  Main Entrance and check in at the main information desk  You need to re-schedule your appointment should you arrive 10 or more minutes late.  We strive to give you quality time with our providers, and arriving late affects you and other patients whose appointments are after yours.  Also, if you no show three or more times for appointments you may be dismissed from the clinic at the providers discretion.     Again, thank you for choosing [facility].  Our hope is that these requests will decrease the amount of time that you wait before being seen by our physicians.       _____________________________________________________________  Should you have questions after your visit to [facility], please contact our office at [phone] between the hours of 8:00 a.m. and 4:30 p.m.  Voicemails left after 4:00 p.m. will not be returned until the following business day.  For prescription refill requests, have your pharmacy contact our office and allow 72 hours.    Cancer Center Support Programs:   > Cancer Support Group  2nd Tuesday of the month 1pm-2pm, Journey Room    
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
## 2018-06-26 NOTE — Assessment & Plan Note (Signed)
.  1.  IgG lambda plasma cell myeloma, stage II by R-ISS: - Presentation to [facility] with left-sided rib pain on 12/29/2017, found to have hypercalcemia, anemia and acute kidney injury, further work-up showed 3.2 g/dL of IgG lambda monoclonal gammopathy. -Skeletal survey reportedly demonstrated diffuse osteopenia and multiple lytic lesions in bilateral lower extremities. -Bone marrow biopsy on 12/31/2017 showed hypercellular marrow with 40 to 50% cellularity, involved by plasma cell neoplasm (17% plasma cells by manual aspirate differential count, greater than 90% and 30 to 40% by CD138 immunohistochemical analysis of clot and core respectively, and monotypic lambda by flow cytometry analysis) - Chromosome analysis showed 12 of 20 cells examined are normal, 46, X6.  The remaining 8 metaphase cells analyzed are abnormal and represent a hyperdiploid clone containing 1 extra copy each of chromosome 3, 5, 6, 7, 9, 11, 15, 19, 20, 21, 22 as well as a marker chromosome. -Multiple myeloma FISH panel shows nuclei positive for three 1q signals, trisomy 11, and extra signals of chromosome 7, 9 and 15. - Patient was recommended to start chemotherapy with port placement by Dr. Federico Flake at [facility].  She was recommended to have IV chemotherapy as she only has Medicare part A. Patient did not follow-up at [facility] -On 04/07/2018, M spike was 4 g of IgG lambda.  LDH was 115.  Beta-2 microglobulin-12.8.  Light chain ratio is 0, Lambda light chains were elevated at 59,667. -Bone survey showed large expansile lytic lesion in the left shoulder, multiple lytic lesions in all bones. -First treatment with Arelia Sneddon was started on 04/17/2018. - Cycle 2 with KRd started on 05/15/2018.  Revlimid was 10 mg 3 weeks on 1 week off.  She received last dose of carfilzomib on 05/16/2018. - She presented to the ER on 05/20/2018 with abdominal pain and was diagnosed with appendicitis, underwent appendectomy on the same day. -She did have a  couple of admissions to the hospital after that.  She is currently pain-free. - I have reviewed CT scan of the abdomen and pelvis dated 06/10/2018 which did not show any bowel obstruction. - She was started on cycle 2 KRD on 06/12/2018 and 06/13/2018. - She had developed erythematous maculopapular rash over the trunk and extremities around 06/13/2018, and was treated with steroids.  Rash was thought to be secondary to Revlimid. - Today she will proceed with her cycle 2 of carfilzomib and dexamethasone.  We will hold Revlimid. -If she does not develop any rash, I will switch her to pomalidomide. -We have discussed the results of M spike which was 4 g on 04/17/2018, improved to 0.6 g on 06/12/2018.  Free light chain ratio has also improved from 0.00-0.15. - I have also phoned and talked to Dr. Norma Fredrickson at [facility] for transplant evaluation.  She will be seen there around mid December.  2.  Difficulty falling and staying asleep: -She will continue Ambien 10 mg as needed.  3.  Hypercalcemia: - She had hypercalcemia presentation.  This is no longer a problem.  She is taking calcium 1 g daily.  Zometa was on 04/15/2018.  We will plan to put her back on Zometa.  4.  Anxiety: - She will take Xanax 0.25 mg twice daily as needed.   5.  Back pain: - She is taking hydrocodone 5 mg twice daily and tolerating it well.  Pain is well controlled.

## 2018-06-27 ENCOUNTER — Encounter (HOSPITAL_COMMUNITY): Payer: Self-pay

## 2018-06-27 ENCOUNTER — Inpatient Hospital Stay (HOSPITAL_COMMUNITY): Payer: Medicaid Other

## 2018-06-27 ENCOUNTER — Other Ambulatory Visit: Payer: Self-pay

## 2018-06-27 VITALS — BP 134/70 | HR 88 | Temp 98.0°F | Resp 18 | Wt 161.6 lb

## 2018-06-27 DIAGNOSIS — C9000 Multiple myeloma not having achieved remission: Secondary | ICD-10-CM

## 2018-06-27 DIAGNOSIS — Z5112 Encounter for antineoplastic immunotherapy: Secondary | ICD-10-CM | POA: Diagnosis not present

## 2018-06-27 MED ORDER — SODIUM CHLORIDE 0.9 % IV SOLN
Freq: Once | INTRAVENOUS | Status: AC
  Administered 2018-06-27: 11:00:00 via INTRAVENOUS

## 2018-06-27 MED ORDER — DEXTROSE 5 % IV SOLN
33.0000 mg/m2 | Freq: Once | INTRAVENOUS | Status: AC
  Administered 2018-06-27: 60 mg via INTRAVENOUS
  Filled 2018-06-27: qty 30

## 2018-06-27 MED ORDER — SODIUM CHLORIDE 0.9% FLUSH
10.0000 mL | INTRAVENOUS | Status: DC | PRN
  Administered 2018-06-27: 10 mL
  Filled 2018-06-27: qty 10

## 2018-06-27 MED ORDER — OCTREOTIDE ACETATE 30 MG IM KIT
PACK | INTRAMUSCULAR | Status: AC
  Filled 2018-06-27: qty 1

## 2018-06-27 MED ORDER — FAMOTIDINE 20 MG PO TABS: 20.0000 mg | ORAL_TABLET | Freq: Two times a day (BID) | ORAL | 0 refills | Status: DC

## 2018-06-27 MED ORDER — HEPARIN SOD (PORK) LOCK FLUSH 100 UNIT/ML IV SOLN
500.0000 [IU] | Freq: Once | INTRAVENOUS | Status: AC | PRN
  Administered 2018-06-27: 500 [IU]

## 2018-06-27 MED ORDER — ALLOPURINOL 300 MG PO TABS: 300.0000 mg | ORAL_TABLET | Freq: Every day | ORAL | 1 refills | Status: DC

## 2018-06-27 NOTE — Patient Instructions (Signed)
[facility] Discharge Instructions for Patients Receiving Chemotherapy  Today you received the following chemotherapy agents   To help prevent nausea and vomiting after your treatment, we encourage you to take your nausea medication   If you develop nausea and vomiting that is not controlled by your nausea medication, call the clinic.   BELOW ARE SYMPTOMS THAT SHOULD BE REPORTED IMMEDIATELY:  *FEVER GREATER THAN 100.5 F  *CHILLS WITH OR WITHOUT FEVER  NAUSEA AND VOMITING THAT IS NOT CONTROLLED WITH YOUR NAUSEA MEDICATION  *UNUSUAL SHORTNESS OF BREATH  *UNUSUAL BRUISING OR BLEEDING  TENDERNESS IN MOUTH AND THROAT WITH OR WITHOUT PRESENCE OF ULCERS  *URINARY PROBLEMS  *BOWEL PROBLEMS  UNUSUAL RASH Items with * indicate a potential emergency and should be followed up as soon as possible.  Feel free to call the clinic should you have any questions or concerns. The clinic phone number is [phone].  Please show the CHEMO ALERT CARD at check-in to the Emergency Department and triage nurse.   

## 2018-06-27 NOTE — Progress Notes (Signed)
Pt here today for Day 2 Kyprolis. Pt requesting refills today. Pepcid 20 mgs PO and zyloprim 300mg  PO.  Refills sent. No complaints today.   Treatment given today per MD orders. Tolerated infusion without adverse affects. Vital signs stable. No complaints at this time. Discharged from clinic ambulatory. F/U with [facility] as scheduled.

## 2018-06-30 ENCOUNTER — Other Ambulatory Visit (HOSPITAL_COMMUNITY): Payer: Self-pay

## 2018-06-30 DIAGNOSIS — C9000 Multiple myeloma not having achieved remission: Secondary | ICD-10-CM

## 2018-07-03 ENCOUNTER — Inpatient Hospital Stay (HOSPITAL_COMMUNITY): Payer: Medicaid Other

## 2018-07-03 ENCOUNTER — Other Ambulatory Visit: Payer: Self-pay

## 2018-07-03 ENCOUNTER — Encounter (HOSPITAL_COMMUNITY): Payer: Self-pay

## 2018-07-03 ENCOUNTER — Other Ambulatory Visit (HOSPITAL_COMMUNITY): Payer: Self-pay | Admitting: *Deleted

## 2018-07-03 VITALS — BP 152/67 | HR 94 | Temp 98.1°F | Resp 18

## 2018-07-03 DIAGNOSIS — C9000 Multiple myeloma not having achieved remission: Secondary | ICD-10-CM

## 2018-07-03 DIAGNOSIS — Z5112 Encounter for antineoplastic immunotherapy: Secondary | ICD-10-CM | POA: Diagnosis not present

## 2018-07-03 LAB — CBC WITH DIFFERENTIAL/PLATELET
Abs Immature Granulocytes: 0.05 10*3/uL (ref 0.00–0.07)
BASOS ABS: 0.1 10*3/uL (ref 0.0–0.1)
Basophils Relative: 1 %
EOS PCT: 43 %
Eosinophils Absolute: 2.6 10*3/uL — ABNORMAL HIGH (ref 0.0–0.5)
HCT: 27.2 % — ABNORMAL LOW (ref 36.0–46.0)
Hemoglobin: 8.2 g/dL — ABNORMAL LOW (ref 12.0–15.0)
Immature Granulocytes: 1 %
LYMPHS ABS: 0.5 10*3/uL — AB (ref 0.7–4.0)
LYMPHS PCT: 8 %
MCH: 31.5 pg (ref 26.0–34.0)
MCHC: 30.1 g/dL (ref 30.0–36.0)
MCV: 104.6 fL — ABNORMAL HIGH (ref 80.0–100.0)
MONO ABS: 0.4 10*3/uL (ref 0.1–1.0)
Monocytes Relative: 7 %
NRBC: 0 % (ref 0.0–0.2)
Neutro Abs: 2.5 10*3/uL (ref 1.7–7.7)
Neutrophils Relative %: 40 %
PLATELETS: 232 10*3/uL (ref 150–400)
RBC: 2.6 MIL/uL — ABNORMAL LOW (ref 3.87–5.11)
RDW: 18 % — AB (ref 11.5–15.5)
WBC: 6.1 10*3/uL (ref 4.0–10.5)

## 2018-07-03 LAB — COMPREHENSIVE METABOLIC PANEL
ALBUMIN: 3.2 g/dL — AB (ref 3.5–5.0)
ALT: 13 U/L (ref 0–44)
AST: 13 U/L — AB (ref 15–41)
Alkaline Phosphatase: 87 U/L (ref 38–126)
Anion gap: 7 (ref 5–15)
BILIRUBIN TOTAL: 0.5 mg/dL (ref 0.3–1.2)
BUN: 25 mg/dL — AB (ref 8–23)
CALCIUM: 8.5 mg/dL — AB (ref 8.9–10.3)
CO2: 25 mmol/L (ref 22–32)
Chloride: 105 mmol/L (ref 98–111)
Creatinine, Ser: 1.94 mg/dL — ABNORMAL HIGH (ref 0.44–1.00)
GFR calc Af Amer: 29 mL/min — ABNORMAL LOW (ref >60)
GFR, EST NON AFRICAN AMERICAN: 25 mL/min — AB (ref >60)
GLUCOSE: 192 mg/dL — AB (ref 70–99)
Potassium: 5 mmol/L (ref 3.5–5.1)
Sodium: 137 mmol/L (ref 135–145)
TOTAL PROTEIN: 5.9 g/dL — AB (ref 6.5–8.1)

## 2018-07-03 MED ORDER — SODIUM CHLORIDE 0.9 % IV SOLN
Freq: Once | INTRAVENOUS | Status: AC
  Administered 2018-07-03: 10:00:00 via INTRAVENOUS

## 2018-07-03 MED ORDER — DEXAMETHASONE 4 MG PO TABS: 20.0000 mg | ORAL_TABLET | ORAL | 0 refills | Status: DC

## 2018-07-03 MED ORDER — DEXTROSE 5 % IV SOLN
33.0000 mg/m2 | Freq: Once | INTRAVENOUS | Status: AC
  Administered 2018-07-03: 60 mg via INTRAVENOUS
  Filled 2018-07-03: qty 30

## 2018-07-03 MED ORDER — SODIUM CHLORIDE 0.9% FLUSH
10.0000 mL | INTRAVENOUS | Status: DC | PRN
  Administered 2018-07-03: 10 mL
  Filled 2018-07-03: qty 10

## 2018-07-03 MED ORDER — HEPARIN SOD (PORK) LOCK FLUSH 100 UNIT/ML IV SOLN
500.0000 [IU] | Freq: Once | INTRAVENOUS | Status: AC | PRN
  Administered 2018-07-03: 500 [IU]

## 2018-07-03 NOTE — Progress Notes (Signed)
Pt here today for Kyprolis treatment. VSS. Pt requesting refill on Decadron 5mg  and Hydrocodone. Continue to hold Revlamid. Rash to arms and legs no change.   Printed off schedule with upcoming appts and gave to patient.   Treatment given today per MD orders. Tolerated infusion without adverse affects. Vital signs stable. No complaints at this time. Discharged from clinic ambulatory. F/U with [facility] as scheduled.

## 2018-07-03 NOTE — Patient Instructions (Signed)
[facility] Discharge Instructions for Patients Receiving Chemotherapy  Today you received the following chemotherapy agents Kyprolis  To help prevent nausea and vomiting after your treatment, we encourage you to take your nausea medication    If you develop nausea and vomiting that is not controlled by your nausea medication, call the clinic.   BELOW ARE SYMPTOMS THAT SHOULD BE REPORTED IMMEDIATELY: *FEVER GREATER THAN 100.5 F *CHILLS WITH OR WITHOUT FEVER NAUSEA AND VOMITING THAT IS NOT CONTROLLED WITH YOUR NAUSEA MEDICATION *UNUSUAL SHORTNESS OF BREATH *UNUSUAL BRUISING OR BLEEDING TENDERNESS IN MOUTH AND THROAT WITH OR WITHOUT PRESENCE OF ULCERS *URINARY PROBLEMS *BOWEL PROBLEMS UNUSUAL RASH Items with * indicate a potential emergency and should be followed up as soon as possible.  Feel free to call the clinic should you have any questions or concerns. The clinic phone number is [phone].  Please show the CHEMO ALERT CARD at check-in to the Emergency Department and triage nurse.   

## 2018-07-04 ENCOUNTER — Inpatient Hospital Stay (HOSPITAL_COMMUNITY): Payer: Medicaid Other

## 2018-07-04 VITALS — BP 150/68 | HR 98 | Temp 98.1°F | Resp 17

## 2018-07-04 DIAGNOSIS — Z5112 Encounter for antineoplastic immunotherapy: Secondary | ICD-10-CM | POA: Diagnosis not present

## 2018-07-04 DIAGNOSIS — C9000 Multiple myeloma not having achieved remission: Secondary | ICD-10-CM

## 2018-07-04 MED ORDER — SODIUM CHLORIDE 0.9 % IV SOLN
Freq: Once | INTRAVENOUS | Status: AC
  Administered 2018-07-04: 14:00:00 via INTRAVENOUS

## 2018-07-04 MED ORDER — HYDROCODONE-ACETAMINOPHEN 5-325 MG PO TABS: 1.0000 | ORAL_TABLET | Freq: Three times a day (TID) | ORAL | 0 refills | Status: DC | PRN

## 2018-07-04 MED ORDER — HEPARIN SOD (PORK) LOCK FLUSH 100 UNIT/ML IV SOLN
500.0000 [IU] | Freq: Once | INTRAVENOUS | Status: AC | PRN
  Administered 2018-07-04: 500 [IU]

## 2018-07-04 MED ORDER — SODIUM CHLORIDE 0.9% FLUSH
10.0000 mL | INTRAVENOUS | Status: DC | PRN
  Administered 2018-07-04: 10 mL
  Filled 2018-07-04: qty 10

## 2018-07-04 MED ORDER — DEXTROSE 5 % IV SOLN
33.0000 mg/m2 | Freq: Once | INTRAVENOUS | Status: AC
  Administered 2018-07-04: 60 mg via INTRAVENOUS
  Filled 2018-07-04: qty 30

## 2018-07-04 NOTE — Progress Notes (Signed)
Pt here today for Cycle 2 Day 9 Kyprolis. No changes since last visit. VSS.   Treatment given today per MD orders. Tolerated infusion without adverse affects. Vital signs stable. No complaints at this time. Discharged from clinic ambulatory. F/U with [facility] as scheduled.

## 2018-07-04 NOTE — Patient Instructions (Signed)
[facility] Discharge Instructions for Patients Receiving Chemotherapy  Today you received the following chemotherapy agents   To help prevent nausea and vomiting after your treatment, we encourage you to take your nausea medication   If you develop nausea and vomiting that is not controlled by your nausea medication, call the clinic.   BELOW ARE SYMPTOMS THAT SHOULD BE REPORTED IMMEDIATELY:  *FEVER GREATER THAN 100.5 F  *CHILLS WITH OR WITHOUT FEVER  NAUSEA AND VOMITING THAT IS NOT CONTROLLED WITH YOUR NAUSEA MEDICATION  *UNUSUAL SHORTNESS OF BREATH  *UNUSUAL BRUISING OR BLEEDING  TENDERNESS IN MOUTH AND THROAT WITH OR WITHOUT PRESENCE OF ULCERS  *URINARY PROBLEMS  *BOWEL PROBLEMS  UNUSUAL RASH Items with * indicate a potential emergency and should be followed up as soon as possible.  Feel free to call the clinic should you have any questions or concerns. The clinic phone number is [phone].  Please show the CHEMO ALERT CARD at check-in to the Emergency Department and triage nurse.   

## 2018-07-09 ENCOUNTER — Other Ambulatory Visit (HOSPITAL_COMMUNITY): Payer: Self-pay

## 2018-07-09 DIAGNOSIS — C9000 Multiple myeloma not having achieved remission: Secondary | ICD-10-CM

## 2018-07-10 ENCOUNTER — Other Ambulatory Visit: Payer: Self-pay

## 2018-07-10 ENCOUNTER — Other Ambulatory Visit (HOSPITAL_COMMUNITY): Payer: Self-pay

## 2018-07-10 ENCOUNTER — Inpatient Hospital Stay (HOSPITAL_COMMUNITY): Payer: Medicaid Other

## 2018-07-10 ENCOUNTER — Ambulatory Visit (HOSPITAL_COMMUNITY): Payer: Self-pay

## 2018-07-10 ENCOUNTER — Ambulatory Visit (HOSPITAL_COMMUNITY): Payer: Self-pay | Admitting: Hematology

## 2018-07-10 ENCOUNTER — Encounter (HOSPITAL_COMMUNITY): Payer: Self-pay

## 2018-07-10 VITALS — BP 139/56 | HR 87 | Temp 98.1°F | Resp 18 | Wt 164.7 lb

## 2018-07-10 DIAGNOSIS — Z5112 Encounter for antineoplastic immunotherapy: Secondary | ICD-10-CM | POA: Diagnosis not present

## 2018-07-10 DIAGNOSIS — C9000 Multiple myeloma not having achieved remission: Secondary | ICD-10-CM

## 2018-07-10 DIAGNOSIS — L299 Pruritus, unspecified: Secondary | ICD-10-CM

## 2018-07-10 DIAGNOSIS — G479 Sleep disorder, unspecified: Secondary | ICD-10-CM

## 2018-07-10 LAB — COMPREHENSIVE METABOLIC PANEL
ALK PHOS: 90 U/L (ref 38–126)
ALT: 12 U/L (ref 0–44)
ANION GAP: 8 (ref 5–15)
AST: 12 U/L — ABNORMAL LOW (ref 15–41)
Albumin: 3.3 g/dL — ABNORMAL LOW (ref 3.5–5.0)
BILIRUBIN TOTAL: 0.6 mg/dL (ref 0.3–1.2)
BUN: 26 mg/dL — ABNORMAL HIGH (ref 8–23)
CALCIUM: 8.4 mg/dL — AB (ref 8.9–10.3)
CO2: 24 mmol/L (ref 22–32)
CREATININE: 2.06 mg/dL — AB (ref 0.44–1.00)
Chloride: 104 mmol/L (ref 98–111)
GFR calc non Af Amer: 23 mL/min — ABNORMAL LOW (ref >60)
GFR, EST AFRICAN AMERICAN: 27 mL/min — AB (ref >60)
Glucose, Bld: 161 mg/dL — ABNORMAL HIGH (ref 70–99)
Potassium: 4.4 mmol/L (ref 3.5–5.1)
Sodium: 136 mmol/L (ref 135–145)
TOTAL PROTEIN: 5.9 g/dL — AB (ref 6.5–8.1)

## 2018-07-10 LAB — CBC WITH DIFFERENTIAL/PLATELET
Abs Immature Granulocytes: 0.01 10*3/uL (ref 0.00–0.07)
Basophils Absolute: 0 10*3/uL (ref 0.0–0.1)
Basophils Relative: 1 %
EOS ABS: 3.5 10*3/uL — AB (ref 0.0–0.5)
EOS PCT: 64 %
HCT: 26 % — ABNORMAL LOW (ref 36.0–46.0)
Hemoglobin: 7.9 g/dL — ABNORMAL LOW (ref 12.0–15.0)
Immature Granulocytes: 0 %
Lymphocytes Relative: 18 %
Lymphs Abs: 0.9 10*3/uL (ref 0.7–4.0)
MCH: 31.9 pg (ref 26.0–34.0)
MCHC: 30.4 g/dL (ref 30.0–36.0)
MCV: 104.8 fL — ABNORMAL HIGH (ref 80.0–100.0)
Monocytes Absolute: 0.7 10*3/uL (ref 0.1–1.0)
Monocytes Relative: 13 %
NEUTROS PCT: 4 %
NRBC: 0 % (ref 0.0–0.2)
Neutro Abs: 0.2 10*3/uL — ABNORMAL LOW (ref 1.7–7.7)
PLATELETS: 224 10*3/uL (ref 150–400)
RBC: 2.48 MIL/uL — AB (ref 3.87–5.11)
RDW: 17.3 % — ABNORMAL HIGH (ref 11.5–15.5)
WBC: 5.4 10*3/uL (ref 4.0–10.5)

## 2018-07-10 MED ORDER — ZOLPIDEM TARTRATE 10 MG PO TABS: 10.0000 mg | ORAL_TABLET | Freq: Every evening | ORAL | 0 refills | Status: DC | PRN

## 2018-07-10 MED ORDER — HYDROXYZINE HCL 25 MG PO TABS: 25.0000 mg | ORAL_TABLET | Freq: Four times a day (QID) | ORAL | 0 refills | Status: DC | PRN

## 2018-07-10 NOTE — Progress Notes (Unsigned)
Pt here today for Day15 Cycle 2 Kyprolis. Labs reviewed with Dr. Delton Coombes and VO no treatment today. Pt requests Ambien, and Vistaril.   Discharged from clinic ambulatory. F/U with [facility] as scheduled.

## 2018-07-11 ENCOUNTER — Encounter (HOSPITAL_COMMUNITY): Payer: Self-pay

## 2018-07-11 ENCOUNTER — Ambulatory Visit (HOSPITAL_COMMUNITY): Payer: Self-pay

## 2018-07-11 LAB — PROTEIN ELECTROPHORESIS, SERUM
A/G Ratio: 1.3 (ref 0.7–1.7)
ALPHA-2-GLOBULIN: 0.8 g/dL (ref 0.4–1.0)
Albumin ELP: 3 g/dL (ref 2.9–4.4)
Alpha-1-Globulin: 0.3 g/dL (ref 0.0–0.4)
BETA GLOBULIN: 0.8 g/dL (ref 0.7–1.3)
GAMMA GLOBULIN: 0.4 g/dL (ref 0.4–1.8)
Globulin, Total: 2.3 g/dL (ref 2.2–3.9)
M-SPIKE, %: 0.3 g/dL — AB
Total Protein ELP: 5.3 g/dL — ABNORMAL LOW (ref 6.0–8.5)

## 2018-07-11 LAB — KAPPA/LAMBDA LIGHT CHAINS
Kappa free light chain: 10.6 mg/L (ref 3.3–19.4)
Kappa, lambda light chain ratio: 0.42 (ref 0.26–1.65)
Lambda free light chains: 25 mg/L (ref 5.7–26.3)

## 2018-07-14 ENCOUNTER — Other Ambulatory Visit (HOSPITAL_COMMUNITY): Payer: Self-pay

## 2018-07-14 ENCOUNTER — Ambulatory Visit (HOSPITAL_COMMUNITY): Payer: Self-pay

## 2018-07-14 MED ORDER — PROCHLORPERAZINE MALEATE 10 MG PO TABS
ORAL_TABLET | ORAL | Status: AC
  Filled 2018-07-14: qty 1

## 2018-07-15 ENCOUNTER — Ambulatory Visit (HOSPITAL_COMMUNITY): Payer: Self-pay

## 2018-07-15 ENCOUNTER — Other Ambulatory Visit (HOSPITAL_COMMUNITY): Payer: Self-pay | Admitting: Hematology

## 2018-07-15 DIAGNOSIS — F419 Anxiety disorder, unspecified: Secondary | ICD-10-CM

## 2018-07-15 MED ORDER — OCTREOTIDE ACETATE 30 MG IM KIT
PACK | INTRAMUSCULAR | Status: AC
  Filled 2018-07-15: qty 1

## 2018-07-18 ENCOUNTER — Other Ambulatory Visit (HOSPITAL_COMMUNITY): Payer: Self-pay | Admitting: Hematology

## 2018-07-18 DIAGNOSIS — R42 Dizziness and giddiness: Secondary | ICD-10-CM

## 2018-07-21 ENCOUNTER — Encounter (HOSPITAL_COMMUNITY): Payer: Self-pay | Admitting: Hematology

## 2018-07-21 ENCOUNTER — Inpatient Hospital Stay (HOSPITAL_COMMUNITY): Payer: Medicaid Other

## 2018-07-21 ENCOUNTER — Inpatient Hospital Stay (HOSPITAL_COMMUNITY): Payer: Medicaid Other | Attending: Hematology | Admitting: Hematology

## 2018-07-21 ENCOUNTER — Telehealth (HOSPITAL_COMMUNITY): Payer: Self-pay | Admitting: Hematology

## 2018-07-21 ENCOUNTER — Ambulatory Visit (HOSPITAL_COMMUNITY): Payer: Self-pay

## 2018-07-21 VITALS — BP 130/62 | HR 82 | Temp 98.1°F | Resp 18 | Wt 165.3 lb

## 2018-07-21 DIAGNOSIS — R42 Dizziness and giddiness: Secondary | ICD-10-CM

## 2018-07-21 DIAGNOSIS — C9000 Multiple myeloma not having achieved remission: Secondary | ICD-10-CM | POA: Insufficient documentation

## 2018-07-21 DIAGNOSIS — F419 Anxiety disorder, unspecified: Secondary | ICD-10-CM | POA: Diagnosis not present

## 2018-07-21 DIAGNOSIS — M8580 Other specified disorders of bone density and structure, unspecified site: Secondary | ICD-10-CM

## 2018-07-21 DIAGNOSIS — Z5112 Encounter for antineoplastic immunotherapy: Secondary | ICD-10-CM | POA: Diagnosis not present

## 2018-07-21 LAB — COMPREHENSIVE METABOLIC PANEL
ALT: 12 U/L (ref 0–44)
AST: 12 U/L — ABNORMAL LOW (ref 15–41)
Albumin: 3.5 g/dL (ref 3.5–5.0)
Alkaline Phosphatase: 95 U/L (ref 38–126)
Anion gap: 8 (ref 5–15)
BILIRUBIN TOTAL: 0.4 mg/dL (ref 0.3–1.2)
BUN: 25 mg/dL — ABNORMAL HIGH (ref 8–23)
CO2: 25 mmol/L (ref 22–32)
CREATININE: 2.04 mg/dL — AB (ref 0.44–1.00)
Calcium: 8.7 mg/dL — ABNORMAL LOW (ref 8.9–10.3)
Chloride: 107 mmol/L (ref 98–111)
GFR calc Af Amer: 28 mL/min — ABNORMAL LOW (ref >60)
GFR, EST NON AFRICAN AMERICAN: 24 mL/min — AB (ref >60)
Glucose, Bld: 131 mg/dL — ABNORMAL HIGH (ref 70–99)
Potassium: 5 mmol/L (ref 3.5–5.1)
Sodium: 140 mmol/L (ref 135–145)
Total Protein: 6.6 g/dL (ref 6.5–8.1)

## 2018-07-21 LAB — CBC WITH DIFFERENTIAL/PLATELET
Abs Immature Granulocytes: 0.12 10*3/uL — ABNORMAL HIGH (ref 0.00–0.07)
Basophils Absolute: 0.1 10*3/uL (ref 0.0–0.1)
Basophils Relative: 1 %
Eosinophils Absolute: 1.3 10*3/uL — ABNORMAL HIGH (ref 0.0–0.5)
Eosinophils Relative: 15 %
HEMATOCRIT: 27 % — AB (ref 36.0–46.0)
HEMOGLOBIN: 8.2 g/dL — AB (ref 12.0–15.0)
Immature Granulocytes: 1 %
Lymphocytes Relative: 11 %
Lymphs Abs: 1 10*3/uL (ref 0.7–4.0)
MCH: 32.4 pg (ref 26.0–34.0)
MCHC: 30.4 g/dL (ref 30.0–36.0)
MCV: 106.7 fL — AB (ref 80.0–100.0)
Monocytes Absolute: 0.7 10*3/uL (ref 0.1–1.0)
Monocytes Relative: 8 %
Neutro Abs: 5.4 10*3/uL (ref 1.7–7.7)
Neutrophils Relative %: 64 %
Platelets: 250 10*3/uL (ref 150–400)
RBC: 2.53 MIL/uL — ABNORMAL LOW (ref 3.87–5.11)
RDW: 15.9 % — ABNORMAL HIGH (ref 11.5–15.5)
WBC: 8.6 10*3/uL (ref 4.0–10.5)
nRBC: 0 % (ref 0.0–0.2)

## 2018-07-21 LAB — LACTATE DEHYDROGENASE: LDH: 216 U/L — ABNORMAL HIGH (ref 98–192)

## 2018-07-21 LAB — TSH: TSH: 1.206 u[IU]/mL (ref 0.350–4.500)

## 2018-07-21 MED ORDER — PANTOPRAZOLE SODIUM 40 MG PO TBEC: 40.0000 mg | DELAYED_RELEASE_TABLET | Freq: Every day | ORAL | 1 refills | Status: DC

## 2018-07-21 MED ORDER — GLIPIZIDE ER 5 MG PO TB24: 5.0000 mg | ORAL_TABLET | Freq: Every day | ORAL | 1 refills | Status: DC

## 2018-07-21 MED ORDER — POMALIDOMIDE 2 MG PO CAPS: 2.0000 mg | ORAL_CAPSULE | Freq: Every day | ORAL | 0 refills | Status: DC

## 2018-07-21 MED ORDER — ALPRAZOLAM 0.25 MG PO TABS: 0.2500 mg | ORAL_TABLET | Freq: Two times a day (BID) | ORAL | 0 refills | Status: DC | PRN

## 2018-07-21 MED ORDER — LISINOPRIL 10 MG PO TABS: 10.0000 mg | ORAL_TABLET | Freq: Every day | ORAL | 1 refills | Status: DC
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
## 2018-07-21 NOTE — Assessment & Plan Note (Addendum)
.  1.  IgG lambda plasma cell myeloma, stage II by R-ISS: - Presentation to [facility] with left-sided rib pain on 12/29/2017, found to have hypercalcemia, anemia and acute kidney injury, further work-up showed 3.2 g/dL of IgG lambda monoclonal gammopathy. -Skeletal survey reportedly demonstrated diffuse osteopenia and multiple lytic lesions in bilateral lower extremities. -Bone marrow biopsy on 12/31/2017 showed hypercellular marrow with 40 to 50% cellularity, involved by plasma cell neoplasm (17% plasma cells by manual aspirate differential count, greater than 90% and 30 to 40% by CD138 immunohistochemical analysis of clot and core respectively, and monotypic lambda by flow cytometry analysis) - Chromosome analysis showed 12 of 20 cells examined are normal, 46, X6.  The remaining 8 metaphase cells analyzed are abnormal and represent a hyperdiploid clone containing 1 extra copy each of chromosome 3, 5, 6, 7, 9, 11, 15, 19, 20, 21, 22 as well as a marker chromosome. -Multiple myeloma FISH panel shows nuclei positive for three 1q signals, trisomy 11, and extra signals of chromosome 7, 9 and 15. - Patient was recommended to start chemotherapy with port placement by Dr. Federico Flake at [facility].  She was recommended to have IV chemotherapy as she only has Medicare part A. Patient did not follow-up at [facility] -On 04/07/2018, M spike was 4 g of IgG lambda.  LDH was 115.  Beta-2 microglobulin-12.8.  Light chain ratio is 0, Lambda light chains were elevated at 59,667. -Bone survey showed large expansile lytic lesion in the left shoulder, multiple lytic lesions in all bones. -First treatment with Arelia Sneddon was started on 04/17/2018. - Cycle 2 with KRd started on 05/15/2018.  Revlimid was 10 mg 3 weeks on 1 week off.  She received last dose of carfilzomib on 05/16/2018. - She presented to the ER on 05/20/2018 with abdominal pain and was diagnosed with appendicitis, underwent appendectomy on the same day. -She did have a  couple of admissions to the hospital after that.  She is currently pain-free. - I have reviewed CT scan of the abdomen and pelvis dated 06/10/2018 which did not show any bowel obstruction. - She was started on cycle 2 KRD on 06/12/2018 and 06/13/2018. - She had developed erythematous maculopapular rash over the trunk and extremities around 06/13/2018, and was treated with steroids.  Rash was thought to be secondary to Revlimid. - Day 15 and 16 carfilzomib was held on 07/10/2018 secondary to neutropenia, ANC of 200. - We reviewed the results of M spike on 07/10/2018.  It has come down to 0.3 g/dL.  Free light chain ratio was normal at 0.42. - She will start her cycle 3 on 07/24/2018.  I plan to switch her to pomalidomide and to decrease dose of 2 mg, 3 weeks on 1 week off.  She could not tolerate Revlimid due to rash.  She will continue dexamethasone 40 mg weekly. -She has an appointment to see Dr. Norma Fredrickson on 08/04/2018 for consultation about autologous stem cell transplant. -I will see her back in 4 weeks for follow-up.  2.  Difficulty falling and staying asleep: -She will continue Ambien 10 mg as needed.  3.  Hypercalcemia: - She had hypercalcemia at presentation.  Her calcium is normal today.  She is taking oral calcium supplements.  Zometa was last given on 06/10/2018.  We will resume monthly Zometa.  4.  Anxiety: - We have given a refill for Xanax 0.25 mg twice daily as needed for anxiety.

## 2018-07-21 NOTE — Patient Instructions (Signed)
[facility] at [facility] Discharge Instructions     Thank you for choosing [facility] at [facility] to provide your oncology and hematology care.  To afford each patient quality time with our provider, please arrive at least 15 minutes before your scheduled appointment time.   If you have a lab appointment with the Cancer Center please come in thru the  Main Entrance and check in at the main information desk  You need to re-schedule your appointment should you arrive 10 or more minutes late.  We strive to give you quality time with our providers, and arriving late affects you and other patients whose appointments are after yours.  Also, if you no show three or more times for appointments you may be dismissed from the clinic at the providers discretion.     Again, thank you for choosing [facility].  Our hope is that these requests will decrease the amount of time that you wait before being seen by our physicians.       _____________________________________________________________  Should you have questions after your visit to [facility], please contact our office at [phone] between the hours of 8:00 a.m. and 4:30 p.m.  Voicemails left after 4:00 p.m. will not be returned until the following business day.  For prescription refill requests, have your pharmacy contact our office and allow 72 hours.    Cancer Center Support Programs:   > Cancer Support Group  2nd Tuesday of the month 1pm-2pm, Journey Room    

## 2018-07-21 NOTE — Telephone Encounter (Signed)
FAXED NEW SCRIPT TO  [facility] [employer] Fax: [phone] Phone: [phone]

## 2018-07-22 ENCOUNTER — Ambulatory Visit (HOSPITAL_COMMUNITY): Payer: Self-pay

## 2018-07-22 LAB — PROTEIN ELECTROPHORESIS, SERUM
A/G Ratio: 1.2 (ref 0.7–1.7)
Albumin ELP: 3.2 g/dL (ref 2.9–4.4)
Alpha-1-Globulin: 0.3 g/dL (ref 0.0–0.4)
Alpha-2-Globulin: 1 g/dL (ref 0.4–1.0)
Beta Globulin: 0.9 g/dL (ref 0.7–1.3)
GAMMA GLOBULIN: 0.4 g/dL (ref 0.4–1.8)
GLOBULIN, TOTAL: 2.6 g/dL (ref 2.2–3.9)
M-SPIKE, %: 0.2 g/dL — AB
TOTAL PROTEIN ELP: 5.8 g/dL — AB (ref 6.0–8.5)

## 2018-07-22 LAB — KAPPA/LAMBDA LIGHT CHAINS
Kappa free light chain: 15 mg/L (ref 3.3–19.4)
Kappa, lambda light chain ratio: 0.4 (ref 0.26–1.65)
Lambda free light chains: 37.9 mg/L — ABNORMAL HIGH (ref 5.7–26.3)

## 2018-07-23 ENCOUNTER — Other Ambulatory Visit (HOSPITAL_COMMUNITY): Payer: Self-pay | Admitting: *Deleted

## 2018-07-23 ENCOUNTER — Telehealth (HOSPITAL_COMMUNITY): Payer: Self-pay | Admitting: Hematology

## 2018-07-23 LAB — IMMUNOFIXATION ELECTROPHORESIS
IGA: 36 mg/dL — AB (ref 87–352)
IGG (IMMUNOGLOBIN G), SERUM: 592 mg/dL — AB (ref 700–1600)
IgM (Immunoglobulin M), Srm: 6 mg/dL — ABNORMAL LOW (ref 26–217)
Total Protein ELP: 5.7 g/dL — ABNORMAL LOW (ref 6.0–8.5)

## 2018-07-23 NOTE — Telephone Encounter (Signed)
FAXED NEW POMALYST SCRIPT TO [facility] ON 07/21/18. RECVD EMAIL TODAY REQUESTING NEW APPLICATION. IN THE PROCESS OF SENDING NEW APP FOUND OUT PT NOW HAS RX COVERAGE. WILL FORWARD SCRIPT TO [facility] AS URGENT

## 2018-07-24 ENCOUNTER — Other Ambulatory Visit (HOSPITAL_COMMUNITY): Payer: Self-pay

## 2018-07-24 ENCOUNTER — Encounter (HOSPITAL_COMMUNITY): Payer: Self-pay

## 2018-07-24 ENCOUNTER — Inpatient Hospital Stay (HOSPITAL_COMMUNITY): Payer: Medicaid Other

## 2018-07-24 ENCOUNTER — Ambulatory Visit (HOSPITAL_COMMUNITY): Payer: Self-pay

## 2018-07-24 VITALS — BP 118/60 | HR 88 | Temp 98.2°F | Resp 18 | Wt 166.0 lb

## 2018-07-24 DIAGNOSIS — C9000 Multiple myeloma not having achieved remission: Secondary | ICD-10-CM

## 2018-07-24 DIAGNOSIS — Z5112 Encounter for antineoplastic immunotherapy: Secondary | ICD-10-CM | POA: Diagnosis not present

## 2018-07-24 MED ORDER — SODIUM CHLORIDE 0.9% FLUSH
10.0000 mL | INTRAVENOUS | Status: DC | PRN
  Administered 2018-07-24: 10 mL
  Filled 2018-07-24: qty 10

## 2018-07-24 MED ORDER — SODIUM CHLORIDE 0.9 % IV SOLN: Freq: Once | INTRAVENOUS | Status: DC

## 2018-07-24 MED ORDER — DENOSUMAB 120 MG/1.7ML ~~LOC~~ SOLN
120.0000 mg | Freq: Once | SUBCUTANEOUS | Status: AC
  Administered 2018-07-24: 120 mg via SUBCUTANEOUS
  Filled 2018-07-24: qty 1.7

## 2018-07-24 MED ORDER — SODIUM CHLORIDE 0.9 % IV SOLN
Freq: Once | INTRAVENOUS | Status: AC
  Administered 2018-07-24: 13:00:00 via INTRAVENOUS

## 2018-07-24 MED ORDER — DEXTROSE 5 % IV SOLN
33.0000 mg/m2 | Freq: Once | INTRAVENOUS | Status: AC
  Administered 2018-07-24: 60 mg via INTRAVENOUS
  Filled 2018-07-24: qty 30

## 2018-07-24 NOTE — Progress Notes (Signed)
1335 Labs, including BUN,Creatinine and Calcium reviewed with Dr. Delton Coombes and pt approved for Kyprolis infusion today with Zometa changed to Xgeva monthly starting today per MD. Calcium 8.7 today and pt denied any tooth or jaw pain and no recent or future dental visits                                      MOREEN PIGGOTT tolerated Kyprolis infusion and Xgeva injection well without complaints or incident. Port saline locked and flushed for use tomorrow. VSS upon discharge. Pt discharged self ambulatory in satisfactory condition

## 2018-07-24 NOTE — Patient Instructions (Addendum)
[facility] Discharge Instructions for Patients Receiving Chemotherapy   Beginning January 23rd 2017 lab work for the [facility] will be done in the  Main lab at [facility] on 1st floor. If you have a lab appointment with the [place] please come in thru the  Main Entrance and check in at the main information desk   Today you received the following chemotherapy agents Kyprolis as well as Xgeva injection. Follow-up as scheduled. Call clinic for any questions or concerns  To help prevent nausea and vomiting after your treatment, we encourage you to take your nausea medication   If you develop nausea and vomiting, or diarrhea that is not controlled by your medication, call the clinic.  The clinic phone number is [phone]. Office hours are Monday-Friday 8:30am-5:00pm.  BELOW ARE SYMPTOMS THAT SHOULD BE REPORTED IMMEDIATELY:  *FEVER GREATER THAN 101.0 F  *CHILLS WITH OR WITHOUT FEVER  NAUSEA AND VOMITING THAT IS NOT CONTROLLED WITH YOUR NAUSEA MEDICATION  *UNUSUAL SHORTNESS OF BREATH  *UNUSUAL BRUISING OR BLEEDING  TENDERNESS IN MOUTH AND THROAT WITH OR WITHOUT PRESENCE OF ULCERS  *URINARY PROBLEMS  *BOWEL PROBLEMS  UNUSUAL RASH Items with * indicate a potential emergency and should be followed up as soon as possible. If you have an emergency after office hours please contact your primary care physician or go to the nearest emergency department.  Please call the clinic during office hours if you have any questions or concerns.   You may also contact the Patient Navigator at [phone] should you have any questions or need assistance in obtaining follow up care.      Resources For Cancer Patients and their Caregivers ? American Cancer Society: Can assist with transportation, wigs, general needs, runs Look Good Feel Better.        270-436-7119 ? Cancer Care: Provides financial assistance, online support groups, medication/co-pay  assistance.  1-800-813-HOPE (845) 384-0765) ? [place] Assists [place] cancer patients and their families through emotional , educational and financial support.  251-599-4760 ? [place] DSS Where to apply for food stamps, Medicaid and utility assistance. (817)211-4017 ? [facility]: Transportation to medical appointments. 404-325-0773 ? Social Security Administration: May apply for disability if have a Stage IV cancer. 816-815-2309 615-136-9430 ? [employer], Disability and Transit Services: Assists with nutrition, care and transit needs. 6190344590
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
## 2018-07-25 ENCOUNTER — Inpatient Hospital Stay (HOSPITAL_COMMUNITY): Payer: Medicaid Other

## 2018-07-25 ENCOUNTER — Ambulatory Visit (HOSPITAL_COMMUNITY): Payer: Self-pay

## 2018-07-25 ENCOUNTER — Ambulatory Visit (INDEPENDENT_AMBULATORY_CARE_PROVIDER_SITE_OTHER): Payer: Medicaid Other | Admitting: Psychiatry

## 2018-07-25 VITALS — BP 113/66 | HR 84 | Ht 66.0 in | Wt 166.0 lb

## 2018-07-25 VITALS — BP 115/66 | HR 88 | Temp 97.8°F | Resp 16

## 2018-07-25 DIAGNOSIS — Z5112 Encounter for antineoplastic immunotherapy: Secondary | ICD-10-CM | POA: Diagnosis not present

## 2018-07-25 DIAGNOSIS — F331 Major depressive disorder, recurrent, moderate: Secondary | ICD-10-CM

## 2018-07-25 DIAGNOSIS — F4310 Post-traumatic stress disorder, unspecified: Secondary | ICD-10-CM | POA: Diagnosis not present

## 2018-07-25 DIAGNOSIS — B359 Dermatophytosis, unspecified: Secondary | ICD-10-CM

## 2018-07-25 DIAGNOSIS — C9000 Multiple myeloma not having achieved remission: Secondary | ICD-10-CM

## 2018-07-25 MED ORDER — SODIUM CHLORIDE 0.9 % IV SOLN
Freq: Once | INTRAVENOUS | Status: AC
  Administered 2018-07-25: 13:00:00 via INTRAVENOUS

## 2018-07-25 MED ORDER — CITALOPRAM HYDROBROMIDE 20 MG PO TABS: 20.0000 mg | ORAL_TABLET | Freq: Every day | ORAL | 0 refills | Status: DC

## 2018-07-25 MED ORDER — DEXTROSE 5 % IV SOLN
33.0000 mg/m2 | Freq: Once | INTRAVENOUS | Status: AC
  Administered 2018-07-25: 60 mg via INTRAVENOUS
  Filled 2018-07-25: qty 30

## 2018-07-25 MED ORDER — HEPARIN SOD (PORK) LOCK FLUSH 100 UNIT/ML IV SOLN
500.0000 [IU] | Freq: Once | INTRAVENOUS | Status: AC | PRN
  Administered 2018-07-25: 500 [IU]

## 2018-07-25 MED ORDER — SODIUM CHLORIDE 0.9% FLUSH
10.0000 mL | INTRAVENOUS | Status: DC | PRN
  Administered 2018-07-25: 10 mL
  Filled 2018-07-25: qty 10

## 2018-07-25 MED ORDER — TERBINAFINE HCL 1 % EX CREA: 1.0000 "application " | TOPICAL_CREAM | Freq: Two times a day (BID) | CUTANEOUS | 0 refills | Status: DC

## 2018-07-25 NOTE — Patient Instructions (Signed)
[facility] Discharge Instructions for Patients Receiving Chemotherapy  Today you received the following chemotherapy agents kyprolis.   If you develop nausea and vomiting that is not controlled by your nausea medication, call the clinic.   BELOW ARE SYMPTOMS THAT SHOULD BE REPORTED IMMEDIATELY:  *FEVER GREATER THAN 100.5 F  *CHILLS WITH OR WITHOUT FEVER  NAUSEA AND VOMITING THAT IS NOT CONTROLLED WITH YOUR NAUSEA MEDICATION  *UNUSUAL SHORTNESS OF BREATH  *UNUSUAL BRUISING OR BLEEDING  TENDERNESS IN MOUTH AND THROAT WITH OR WITHOUT PRESENCE OF ULCERS  *URINARY PROBLEMS  *BOWEL PROBLEMS  UNUSUAL RASH Items with * indicate a potential emergency and should be followed up as soon as possible.  Feel free to call the clinic should you have any questions or concerns. The clinic phone number is [phone].  Please show the [facility] at check-in to the Emergency Department and triage nurse.

## 2018-07-25 NOTE — Progress Notes (Signed)
Nutrition Follow-up:  Patient with multiple myeloma.  Patient receiving chemotherapy kyprolis today.   Met with patient during infusion today.  Patient reports that appetite is good. "My weight has gone up to much."  Reports that she usually has [place] Clap breakfast bowl or cereal.  AM snack is usually fruit or 1/2 peanut butter sandwich. Lunch is sometimes just few slices of deli meat, something light. PM snack is more fruit and supper is usually meat and vegetables.  Reports that she drinks ensure occasionally as snack, not daily.  Drinks water and gingerale   Medications: reviewed  Labs: K 5.0, glucose 131, BUN 25, creatinine 2.04  Anthropometrics:   Weight increased to 166 lb noted on 12/5 from 158 lb on 10/25.    NUTRITION DIAGNOSIS: Inadequate oral intake improved    INTERVENTION:  Discussed option to discontinue ensure supplement due to increased appetite and weight gain.  Patient likes supplement as sometimes uses it as snack.  Recommend ensure original (lower calorie and K content) than plus options.   Encouraged patient to eat well balanced diet to maintain nutrition during treatment.     MONITORING, EVALUATION, GOAL: weight trends, intake   NEXT VISIT: as needed  Adrijana Haros B. Zenia Resides, [place], [place] Registered Dietitian [phone] (pager)

## 2018-07-25 NOTE — Patient Instructions (Signed)
1. Continue citalopram 20 mg daily 2. Continue Xanax 0.25 mg twice a day as needed for anxiety 3. Return to clinic in three months for 15 mins

## 2018-07-25 NOTE — Progress Notes (Signed)
Patient tolerated treatment with no complaints voiced.  Port site clean and dry with no bruising or swelling noted at site.  Band aid applied.  VSs with discharge and left ambulatory with no s/s of distress noted.

## 2018-07-28 ENCOUNTER — Ambulatory Visit (HOSPITAL_COMMUNITY): Payer: Self-pay

## 2018-07-28 ENCOUNTER — Other Ambulatory Visit (HOSPITAL_COMMUNITY): Payer: Self-pay

## 2018-07-29 ENCOUNTER — Other Ambulatory Visit (HOSPITAL_COMMUNITY): Payer: Self-pay

## 2018-07-29 ENCOUNTER — Ambulatory Visit (HOSPITAL_COMMUNITY): Payer: Self-pay

## 2018-07-29 DIAGNOSIS — C9000 Multiple myeloma not having achieved remission: Secondary | ICD-10-CM

## 2018-07-31 ENCOUNTER — Ambulatory Visit (HOSPITAL_COMMUNITY): Payer: Self-pay

## 2018-07-31 ENCOUNTER — Inpatient Hospital Stay (HOSPITAL_COMMUNITY): Payer: Medicaid Other

## 2018-07-31 ENCOUNTER — Other Ambulatory Visit (HOSPITAL_COMMUNITY): Payer: Self-pay

## 2018-07-31 ENCOUNTER — Encounter (HOSPITAL_COMMUNITY): Payer: Self-pay

## 2018-07-31 VITALS — BP 157/77 | HR 85 | Temp 98.5°F | Resp 18 | Wt 169.4 lb

## 2018-07-31 DIAGNOSIS — C9000 Multiple myeloma not having achieved remission: Secondary | ICD-10-CM

## 2018-07-31 DIAGNOSIS — Z5112 Encounter for antineoplastic immunotherapy: Secondary | ICD-10-CM | POA: Diagnosis not present

## 2018-07-31 LAB — CBC WITH DIFFERENTIAL/PLATELET
Abs Immature Granulocytes: 0.03 10*3/uL (ref 0.00–0.07)
Basophils Absolute: 0 10*3/uL (ref 0.0–0.1)
Basophils Relative: 1 %
Eosinophils Absolute: 0.9 10*3/uL — ABNORMAL HIGH (ref 0.0–0.5)
Eosinophils Relative: 14 %
HCT: 24.5 % — ABNORMAL LOW (ref 36.0–46.0)
Hemoglobin: 7.4 g/dL — ABNORMAL LOW (ref 12.0–15.0)
Immature Granulocytes: 1 %
Lymphocytes Relative: 15 %
Lymphs Abs: 0.9 10*3/uL (ref 0.7–4.0)
MCH: 31.9 pg (ref 26.0–34.0)
MCHC: 30.2 g/dL (ref 30.0–36.0)
MCV: 105.6 fL — ABNORMAL HIGH (ref 80.0–100.0)
MONO ABS: 0.5 10*3/uL (ref 0.1–1.0)
Monocytes Relative: 8 %
NEUTROS ABS: 3.7 10*3/uL (ref 1.7–7.7)
Neutrophils Relative %: 61 %
Platelets: 161 10*3/uL (ref 150–400)
RBC: 2.32 MIL/uL — ABNORMAL LOW (ref 3.87–5.11)
RDW: 16 % — ABNORMAL HIGH (ref 11.5–15.5)
WBC: 6 10*3/uL (ref 4.0–10.5)
nRBC: 0 % (ref 0.0–0.2)

## 2018-07-31 LAB — COMPREHENSIVE METABOLIC PANEL
ALT: 10 U/L (ref 0–44)
AST: 14 U/L — ABNORMAL LOW (ref 15–41)
Albumin: 3.7 g/dL (ref 3.5–5.0)
Alkaline Phosphatase: 77 U/L (ref 38–126)
Anion gap: 7 (ref 5–15)
BUN: 20 mg/dL (ref 8–23)
CO2: 25 mmol/L (ref 22–32)
Calcium: 9 mg/dL (ref 8.9–10.3)
Chloride: 108 mmol/L (ref 98–111)
Creatinine, Ser: 1.73 mg/dL — ABNORMAL HIGH (ref 0.44–1.00)
GFR calc non Af Amer: 29 mL/min — ABNORMAL LOW (ref >60)
GFR, EST AFRICAN AMERICAN: 34 mL/min — AB (ref >60)
Glucose, Bld: 133 mg/dL — ABNORMAL HIGH (ref 70–99)
Potassium: 5.1 mmol/L (ref 3.5–5.1)
Sodium: 140 mmol/L (ref 135–145)
Total Bilirubin: 0.6 mg/dL (ref 0.3–1.2)
Total Protein: 6.2 g/dL — ABNORMAL LOW (ref 6.5–8.1)

## 2018-07-31 LAB — PREPARE RBC (CROSSMATCH)

## 2018-07-31 MED ORDER — SODIUM CHLORIDE 0.9 % IV SOLN
Freq: Once | INTRAVENOUS | Status: AC
  Administered 2018-07-31: 12:00:00 via INTRAVENOUS

## 2018-07-31 MED ORDER — HEPARIN SOD (PORK) LOCK FLUSH 100 UNIT/ML IV SOLN: 500.0000 [IU] | Freq: Once | INTRAVENOUS | Status: DC | PRN

## 2018-07-31 MED ORDER — SODIUM CHLORIDE 0.9 % IV SOLN: Freq: Once | INTRAVENOUS | Status: DC

## 2018-07-31 MED ORDER — SODIUM CHLORIDE 0.9% FLUSH
10.0000 mL | INTRAVENOUS | Status: DC | PRN
  Administered 2018-07-31: 10 mL
  Filled 2018-07-31: qty 10

## 2018-07-31 MED ORDER — DEXTROSE 5 % IV SOLN
33.0000 mg/m2 | Freq: Once | INTRAVENOUS | Status: AC
  Administered 2018-07-31: 60 mg via INTRAVENOUS
  Filled 2018-07-31: qty 30

## 2018-07-31 NOTE — Patient Instructions (Signed)
[facility] Discharge Instructions for Patients Receiving Chemotherapy   Beginning January 23rd 2017 lab work for the [facility] will be done in the  Main lab at [facility] on 1st floor. If you have a lab appointment with the [facility] please come in thru the  Main Entrance and check in at the main information desk   Today you received the following chemotherapy agents Kyprolis. Follow-up as scheduled. Call clinic for any questions or concerns  To help prevent nausea and vomiting after your treatment, we encourage you to take your nausea medication   If you develop nausea and vomiting, or diarrhea that is not controlled by your medication, call the clinic.  The clinic phone number is [phone]. Office hours are Monday-Friday 8:30am-5:00pm.  BELOW ARE SYMPTOMS THAT SHOULD BE REPORTED IMMEDIATELY:  *FEVER GREATER THAN 101.0 F  *CHILLS WITH OR WITHOUT FEVER  NAUSEA AND VOMITING THAT IS NOT CONTROLLED WITH YOUR NAUSEA MEDICATION  *UNUSUAL SHORTNESS OF BREATH  *UNUSUAL BRUISING OR BLEEDING  TENDERNESS IN MOUTH AND THROAT WITH OR WITHOUT PRESENCE OF ULCERS  *URINARY PROBLEMS  *BOWEL PROBLEMS  UNUSUAL RASH Items with * indicate a potential emergency and should be followed up as soon as possible. If you have an emergency after office hours please contact your primary care physician or go to the nearest emergency department.  Please call the clinic during office hours if you have any questions or concerns.   You may also contact the Patient Navigator at [phone] should you have any questions or need assistance in obtaining follow up care.      Resources For Cancer Patients and their Caregivers ? American Cancer Society: Can assist with transportation, wigs, general needs, runs Look Good Feel Better.        ([phone] ? Cancer Care: Provides financial assistance, online support groups, medication/co-pay assistance.  1-800-813-HOPE  986-489-2081) ? [place] Assists [place] cancer patients and their families through emotional , educational and financial support.  351-551-7573 ? [place] DSS Where to apply for food stamps, Medicaid and utility assistance. 815-290-3133 ? RCATS: Transportation to medical appointments. 717-301-9374 ? Social Security Administration: May apply for disability if have a Stage IV cancer. [phone] 2244433427 ? [employer], Disability and Transit Services: Assists with nutrition, care and transit needs. 516-001-3614

## 2018-07-31 NOTE — Progress Notes (Signed)
1130 Labs, including Creatinine and H+H reviewed with Dr. Delton Coombes and pt approved for Kyprolis infusion today with orders obtained for 1 unit of blood to be given tomorrow with infusion per MD      [name] tolerated Kyprolis infusion with hydration well without complaints or incident. Pt discharged self ambulatory in satisfactory condition

## 2018-08-01 ENCOUNTER — Ambulatory Visit (HOSPITAL_COMMUNITY): Payer: Self-pay

## 2018-08-01 ENCOUNTER — Inpatient Hospital Stay (HOSPITAL_COMMUNITY): Payer: Medicaid Other

## 2018-08-01 ENCOUNTER — Other Ambulatory Visit (HOSPITAL_COMMUNITY): Payer: Self-pay

## 2018-08-01 VITALS — BP 145/79 | HR 88 | Temp 98.5°F | Resp 18

## 2018-08-01 DIAGNOSIS — Z5112 Encounter for antineoplastic immunotherapy: Secondary | ICD-10-CM | POA: Diagnosis not present

## 2018-08-01 DIAGNOSIS — C9000 Multiple myeloma not having achieved remission: Secondary | ICD-10-CM

## 2018-08-01 MED ORDER — DIPHENHYDRAMINE HCL 25 MG PO CAPS
ORAL_CAPSULE | ORAL | Status: AC
  Filled 2018-08-01: qty 1

## 2018-08-01 MED ORDER — SODIUM CHLORIDE 0.9 % IV SOLN
Freq: Once | INTRAVENOUS | Status: AC
  Administered 2018-08-01: 11:00:00 via INTRAVENOUS

## 2018-08-01 MED ORDER — DIPHENHYDRAMINE HCL 25 MG PO CAPS
25.0000 mg | ORAL_CAPSULE | Freq: Once | ORAL | Status: AC
  Administered 2018-08-01: 25 mg via ORAL

## 2018-08-01 MED ORDER — SODIUM CHLORIDE 0.9% IV SOLUTION
250.0000 mL | Freq: Once | INTRAVENOUS | Status: AC
  Administered 2018-08-01: 250 mL via INTRAVENOUS

## 2018-08-01 MED ORDER — ACETAMINOPHEN 325 MG PO TABS
ORAL_TABLET | ORAL | Status: AC
  Filled 2018-08-01: qty 2

## 2018-08-01 MED ORDER — HEPARIN SOD (PORK) LOCK FLUSH 100 UNIT/ML IV SOLN
500.0000 [IU] | Freq: Once | INTRAVENOUS | Status: AC
  Administered 2018-08-01: 500 [IU] via INTRAVENOUS

## 2018-08-01 MED ORDER — ACETAMINOPHEN 325 MG PO TABS
650.0000 mg | ORAL_TABLET | Freq: Once | ORAL | Status: AC
  Administered 2018-08-01: 650 mg via ORAL

## 2018-08-01 MED ORDER — DEXTROSE 5 % IV SOLN
33.0000 mg/m2 | Freq: Once | INTRAVENOUS | Status: AC
  Administered 2018-08-01: 60 mg via INTRAVENOUS
  Filled 2018-08-01: qty 30

## 2018-08-01 NOTE — Patient Instructions (Signed)
[facility]  Discharge Instructions:   _______________________________________________________________  Thank you for choosing [facility] at [facility] to provide your oncology and hematology care.  To afford each patient quality time with our providers, please arrive at least 15 minutes before your scheduled appointment.  You need to re-schedule your appointment if you arrive 10 or more minutes late.  We strive to give you quality time with our providers, and arriving late affects you and other patients whose appointments are after yours.  Also, if you no show three or more times for appointments you may be dismissed from the clinic.  Again, thank you for choosing [facility] at  Hospital. Our hope is that these requests will allow you access to exceptional care and in a timely manner. _______________________________________________________________  If you have questions after your visit, please contact our office at [phone] between the hours of 8:30 a.m. and 5:00 p.m. Voicemails left after 4:30 p.m. will not be returned until the following business day. _______________________________________________________________  For prescription refill requests, have your pharmacy contact our office. _______________________________________________________________  Recommendations made by the consultant and any test results will be sent to your referring physician. _______________________________________________________________ 

## 2018-08-01 NOTE — Progress Notes (Signed)
      1 unit of blood transfused today.Tolerated infusion without adverse affects. Vital signs stable. No complaints at this time. Discharged from clinic ambulatory. F/U with [facility] as scheduled.

## 2018-08-01 NOTE — Progress Notes (Signed)
Patient presented to clinic today for day 9 of cycle 3.  No new issues today. Will proceed per orders.

## 2018-08-02 LAB — TYPE AND SCREEN
ABO/RH(D): A NEG
Antibody Screen: NEGATIVE
Unit division: 0

## 2018-08-02 LAB — BPAM RBC
Blood Product Expiration Date: 202001112359
ISSUE DATE / TIME: 201912131250
Unit Type and Rh: 600

## 2018-08-04 ENCOUNTER — Encounter (HOSPITAL_COMMUNITY): Payer: Self-pay | Admitting: *Deleted

## 2018-08-04 ENCOUNTER — Ambulatory Visit (HOSPITAL_COMMUNITY): Payer: Self-pay

## 2018-08-04 ENCOUNTER — Other Ambulatory Visit (HOSPITAL_COMMUNITY): Payer: Self-pay

## 2018-08-04 ENCOUNTER — Other Ambulatory Visit (HOSPITAL_COMMUNITY): Payer: Self-pay | Admitting: Nurse Practitioner

## 2018-08-04 DIAGNOSIS — C9000 Multiple myeloma not having achieved remission: Secondary | ICD-10-CM

## 2018-08-04 MED ORDER — HYDROCODONE-ACETAMINOPHEN 5-325 MG PO TABS: 1.0000 | ORAL_TABLET | Freq: Three times a day (TID) | ORAL | 0 refills | Status: DC | PRN

## 2018-08-04 NOTE — Progress Notes (Signed)
Office notes, imaging, labs and recent treatment for last 30 days printed and faxed to Dr. Tiana Loft.

## 2018-08-05 ENCOUNTER — Ambulatory Visit (HOSPITAL_COMMUNITY): Payer: Self-pay

## 2018-08-07 ENCOUNTER — Encounter (HOSPITAL_COMMUNITY): Payer: Self-pay

## 2018-08-07 ENCOUNTER — Inpatient Hospital Stay (HOSPITAL_COMMUNITY): Payer: Medicaid Other

## 2018-08-07 ENCOUNTER — Other Ambulatory Visit (HOSPITAL_COMMUNITY): Payer: Self-pay | Admitting: Nurse Practitioner

## 2018-08-07 ENCOUNTER — Other Ambulatory Visit: Payer: Self-pay

## 2018-08-07 ENCOUNTER — Other Ambulatory Visit (HOSPITAL_COMMUNITY): Payer: Self-pay

## 2018-08-07 ENCOUNTER — Ambulatory Visit (HOSPITAL_COMMUNITY): Payer: Self-pay

## 2018-08-07 VITALS — BP 152/65 | HR 66 | Temp 98.1°F | Resp 16 | Wt 169.0 lb

## 2018-08-07 DIAGNOSIS — C9000 Multiple myeloma not having achieved remission: Secondary | ICD-10-CM

## 2018-08-07 DIAGNOSIS — G479 Sleep disorder, unspecified: Secondary | ICD-10-CM

## 2018-08-07 DIAGNOSIS — Z5112 Encounter for antineoplastic immunotherapy: Secondary | ICD-10-CM | POA: Diagnosis not present

## 2018-08-07 LAB — CBC WITH DIFFERENTIAL/PLATELET
Abs Immature Granulocytes: 0.06 10*3/uL (ref 0.00–0.07)
BASOS ABS: 0 10*3/uL (ref 0.0–0.1)
Basophils Relative: 0 %
Eosinophils Absolute: 0.7 10*3/uL — ABNORMAL HIGH (ref 0.0–0.5)
Eosinophils Relative: 11 %
HCT: 27.2 % — ABNORMAL LOW (ref 36.0–46.0)
HEMOGLOBIN: 8.4 g/dL — AB (ref 12.0–15.0)
Immature Granulocytes: 1 %
LYMPHS ABS: 0.9 10*3/uL (ref 0.7–4.0)
Lymphocytes Relative: 14 %
MCH: 31.6 pg (ref 26.0–34.0)
MCHC: 30.9 g/dL (ref 30.0–36.0)
MCV: 102.3 fL — ABNORMAL HIGH (ref 80.0–100.0)
Monocytes Absolute: 0.5 10*3/uL (ref 0.1–1.0)
Monocytes Relative: 8 %
Neutro Abs: 4.2 10*3/uL (ref 1.7–7.7)
Neutrophils Relative %: 66 %
Platelets: 185 10*3/uL (ref 150–400)
RBC: 2.66 MIL/uL — ABNORMAL LOW (ref 3.87–5.11)
RDW: 15.4 % (ref 11.5–15.5)
WBC: 6.3 10*3/uL (ref 4.0–10.5)
nRBC: 0 % (ref 0.0–0.2)

## 2018-08-07 LAB — COMPREHENSIVE METABOLIC PANEL
ALBUMIN: 3.7 g/dL (ref 3.5–5.0)
ALT: 13 U/L (ref 0–44)
AST: 11 U/L — ABNORMAL LOW (ref 15–41)
Alkaline Phosphatase: 66 U/L (ref 38–126)
Anion gap: 5 (ref 5–15)
BUN: 21 mg/dL (ref 8–23)
CO2: 25 mmol/L (ref 22–32)
Calcium: 8.6 mg/dL — ABNORMAL LOW (ref 8.9–10.3)
Chloride: 108 mmol/L (ref 98–111)
Creatinine, Ser: 1.69 mg/dL — ABNORMAL HIGH (ref 0.44–1.00)
GFR calc Af Amer: 35 mL/min — ABNORMAL LOW (ref >60)
GFR calc non Af Amer: 30 mL/min — ABNORMAL LOW (ref >60)
Glucose, Bld: 100 mg/dL — ABNORMAL HIGH (ref 70–99)
Potassium: 5 mmol/L (ref 3.5–5.1)
Sodium: 138 mmol/L (ref 135–145)
Total Bilirubin: 0.4 mg/dL (ref 0.3–1.2)
Total Protein: 6.1 g/dL — ABNORMAL LOW (ref 6.5–8.1)

## 2018-08-07 MED ORDER — ACYCLOVIR 400 MG PO TABS: 400.0000 mg | ORAL_TABLET | Freq: Two times a day (BID) | ORAL | 3 refills | Status: DC

## 2018-08-07 MED ORDER — SODIUM CHLORIDE 0.9 % IV SOLN
Freq: Once | INTRAVENOUS | Status: AC
  Administered 2018-08-07: 14:00:00 via INTRAVENOUS

## 2018-08-07 MED ORDER — SODIUM CHLORIDE 0.9% FLUSH
10.0000 mL | INTRAVENOUS | Status: DC | PRN
  Administered 2018-08-07: 10 mL
  Filled 2018-08-07: qty 10

## 2018-08-07 MED ORDER — DEXTROSE 5 % IV SOLN
33.0000 mg/m2 | Freq: Once | INTRAVENOUS | Status: DC
  Filled 2018-08-07: qty 30

## 2018-08-07 MED ORDER — LISINOPRIL 10 MG PO TABS: 10.0000 mg | ORAL_TABLET | Freq: Every day | ORAL | 1 refills | Status: DC

## 2018-08-07 MED ORDER — DEXTROSE 5 % IV SOLN
33.0000 mg/m2 | Freq: Once | INTRAVENOUS | Status: AC
  Administered 2018-08-07: 60 mg via INTRAVENOUS
  Filled 2018-08-07: qty 30

## 2018-08-07 MED ORDER — HEPARIN SOD (PORK) LOCK FLUSH 100 UNIT/ML IV SOLN: 500.0000 [IU] | Freq: Once | INTRAVENOUS | Status: DC | PRN

## 2018-08-07 MED ORDER — ZOLPIDEM TARTRATE 10 MG PO TABS: 10.0000 mg | ORAL_TABLET | Freq: Every evening | ORAL | 0 refills | Status: DC | PRN

## 2018-08-07 NOTE — Patient Instructions (Signed)
[facility] Discharge Instructions for Patients Receiving Chemotherapy  Today you received the following chemotherapy agents   To help prevent nausea and vomiting after your treatment, we encourage you to take your nausea medication   If you develop nausea and vomiting that is not controlled by your nausea medication, call the clinic.   BELOW ARE SYMPTOMS THAT SHOULD BE REPORTED IMMEDIATELY:  *FEVER GREATER THAN 100.5 F  *CHILLS WITH OR WITHOUT FEVER  NAUSEA AND VOMITING THAT IS NOT CONTROLLED WITH YOUR NAUSEA MEDICATION  *UNUSUAL SHORTNESS OF BREATH  *UNUSUAL BRUISING OR BLEEDING  TENDERNESS IN MOUTH AND THROAT WITH OR WITHOUT PRESENCE OF ULCERS  *URINARY PROBLEMS  *BOWEL PROBLEMS  UNUSUAL RASH Items with * indicate a potential emergency and should be followed up as soon as possible.  Feel free to call the clinic should you have any questions or concerns. The clinic phone number is [phone].  Please show the CHEMO ALERT CARD at check-in to the Emergency Department and triage nurse.   

## 2018-08-07 NOTE — Progress Notes (Signed)
Patient complaining of feeling dizzy, light headed at times. Per pt, she saw Dr. Debbrah Alar on Monday and he said she could cut her decadron dose in half and maybe that would help her with her dizziness.   MD notified of the info above and labs reviewed. Will proceed with treatment and tell patient she could cut dosage in half. Patient informed and she took this dose today at the clinic. So she will be taking 10 mg, 2 1/2 pills twice a week.     Treatment given per orders. Patient tolerated it well without problems. Vitals stable and discharged home from clinic ambulatory. Follow up as scheduled.

## 2018-08-08 ENCOUNTER — Inpatient Hospital Stay (HOSPITAL_COMMUNITY): Payer: Medicaid Other

## 2018-08-08 ENCOUNTER — Ambulatory Visit (HOSPITAL_COMMUNITY): Payer: Self-pay

## 2018-08-08 VITALS — BP 167/71 | HR 70 | Temp 98.1°F | Resp 18

## 2018-08-08 DIAGNOSIS — C9000 Multiple myeloma not having achieved remission: Secondary | ICD-10-CM

## 2018-08-08 DIAGNOSIS — Z5112 Encounter for antineoplastic immunotherapy: Secondary | ICD-10-CM | POA: Diagnosis not present

## 2018-08-08 DIAGNOSIS — G479 Sleep disorder, unspecified: Secondary | ICD-10-CM

## 2018-08-08 MED ORDER — SODIUM CHLORIDE 0.9 % IV SOLN
Freq: Once | INTRAVENOUS | Status: AC
  Administered 2018-08-08: 13:00:00 via INTRAVENOUS

## 2018-08-08 MED ORDER — HEPARIN SOD (PORK) LOCK FLUSH 100 UNIT/ML IV SOLN
500.0000 [IU] | Freq: Once | INTRAVENOUS | Status: AC | PRN
  Administered 2018-08-08: 500 [IU]

## 2018-08-08 MED ORDER — SODIUM CHLORIDE 0.9% FLUSH
10.0000 mL | INTRAVENOUS | Status: DC | PRN
  Administered 2018-08-08: 10 mL
  Filled 2018-08-08: qty 10

## 2018-08-08 MED ORDER — DEXTROSE 5 % IV SOLN
33.0000 mg/m2 | Freq: Once | INTRAVENOUS | Status: AC
  Administered 2018-08-08: 60 mg via INTRAVENOUS
  Filled 2018-08-08: qty 30

## 2018-08-08 MED ORDER — SODIUM CHLORIDE 0.9 % IV SOLN
Freq: Once | INTRAVENOUS | Status: AC
  Administered 2018-08-08: 14:00:00 via INTRAVENOUS

## 2018-08-08 MED ORDER — ZOLPIDEM TARTRATE 10 MG PO TABS: 10.0000 mg | ORAL_TABLET | Freq: Every evening | ORAL | 0 refills | Status: DC | PRN

## 2018-08-08 NOTE — Progress Notes (Signed)
Alexandra Price tolerated Kyprolis infusion with hydration well without complaints or incident.Pt continues to take her Pomalyst as prescribed without any issues VSS upon discharge. Pt discharged self ambulatory in satisfactory condition

## 2018-08-08 NOTE — Patient Instructions (Signed)
[facility] Discharge Instructions for Patients Receiving Chemotherapy   Beginning January 23rd 2017 lab work for the [facility] will be done in the  Main lab at [facility] on 1st floor. If you have a lab appointment with the [facility] please come in thru the  Main Entrance and check in at the main information desk   Today you received the following chemotherapy agents Kyprolis. Follow-up as scheduled. Call clinic for any questions or concerns  To help prevent nausea and vomiting after your treatment, we encourage you to take your nausea medication   If you develop nausea and vomiting, or diarrhea that is not controlled by your medication, call the clinic.  The clinic phone number is [phone]. Office hours are Monday-Friday 8:30am-5:00pm.  BELOW ARE SYMPTOMS THAT SHOULD BE REPORTED IMMEDIATELY:  *FEVER GREATER THAN 101.0 F  *CHILLS WITH OR WITHOUT FEVER  NAUSEA AND VOMITING THAT IS NOT CONTROLLED WITH YOUR NAUSEA MEDICATION  *UNUSUAL SHORTNESS OF BREATH  *UNUSUAL BRUISING OR BLEEDING  TENDERNESS IN MOUTH AND THROAT WITH OR WITHOUT PRESENCE OF ULCERS  *URINARY PROBLEMS  *BOWEL PROBLEMS  UNUSUAL RASH Items with * indicate a potential emergency and should be followed up as soon as possible. If you have an emergency after office hours please contact your primary care physician or go to the nearest emergency department.  Please call the clinic during office hours if you have any questions or concerns.   You may also contact the Patient Navigator at [phone] should you have any questions or need assistance in obtaining follow up care.      Resources For Cancer Patients and their Caregivers ? American Cancer Society: Can assist with transportation, wigs, general needs, runs Look Good Feel Better.        972-741-3871 ? Cancer Care: Provides financial assistance, online support groups, medication/co-pay assistance.  1-800-813-HOPE  949-124-1243) ? [place] Assists [place] cancer patients and their families through emotional , educational and financial support.  5817935640 ? [place] DSS Where to apply for food stamps, Medicaid and utility assistance. 620-731-8377 ? RCATS: Transportation to medical appointments. (878)156-5527 ? Social Security Administration: May apply for disability if have a Stage IV cancer. 908-268-2559 [phone] ? [employer], Disability and Transit Services: Assists with nutrition, care and transit needs. [phone]

## 2018-08-11 ENCOUNTER — Ambulatory Visit (HOSPITAL_COMMUNITY): Payer: Self-pay

## 2018-08-11 ENCOUNTER — Other Ambulatory Visit (HOSPITAL_COMMUNITY): Payer: Self-pay

## 2018-08-12 ENCOUNTER — Ambulatory Visit (HOSPITAL_COMMUNITY): Payer: Self-pay

## 2018-08-15 ENCOUNTER — Other Ambulatory Visit (HOSPITAL_COMMUNITY): Payer: Self-pay | Admitting: Emergency Medicine

## 2018-08-15 DIAGNOSIS — C9000 Multiple myeloma not having achieved remission: Secondary | ICD-10-CM

## 2018-08-15 MED ORDER — POMALIDOMIDE 2 MG PO CAPS: 2.0000 mg | ORAL_CAPSULE | Freq: Every day | ORAL | 0 refills | Status: DC

## 2018-08-15 NOTE — Progress Notes (Unsigned)
Refill on pomalyst

## 2018-08-19 ENCOUNTER — Other Ambulatory Visit (HOSPITAL_COMMUNITY): Payer: Self-pay | Admitting: *Deleted

## 2018-08-19 ENCOUNTER — Telehealth (HOSPITAL_COMMUNITY): Payer: Self-pay | Admitting: *Deleted

## 2018-08-19 ENCOUNTER — Ambulatory Visit (HOSPITAL_COMMUNITY): Payer: Self-pay | Admitting: Hematology

## 2018-08-19 DIAGNOSIS — F419 Anxiety disorder, unspecified: Secondary | ICD-10-CM

## 2018-08-19 MED ORDER — ALPRAZOLAM 0.25 MG PO TABS: 0.2500 mg | ORAL_TABLET | Freq: Two times a day (BID) | ORAL | 0 refills | Status: DC | PRN

## 2018-08-19 NOTE — Telephone Encounter (Signed)
Message sent to provider for refill

## 2018-08-21 ENCOUNTER — Other Ambulatory Visit: Payer: Self-pay

## 2018-08-21 ENCOUNTER — Encounter (HOSPITAL_COMMUNITY): Payer: Self-pay

## 2018-08-21 ENCOUNTER — Inpatient Hospital Stay (HOSPITAL_COMMUNITY): Payer: Medicaid Other

## 2018-08-21 ENCOUNTER — Inpatient Hospital Stay (HOSPITAL_COMMUNITY): Payer: Medicaid Other | Attending: Hematology | Admitting: Hematology

## 2018-08-21 ENCOUNTER — Encounter (HOSPITAL_COMMUNITY): Payer: Self-pay | Admitting: Hematology

## 2018-08-21 VITALS — BP 131/59 | HR 69 | Temp 97.7°F | Resp 18 | Wt 163.4 lb

## 2018-08-21 DIAGNOSIS — Z5112 Encounter for antineoplastic immunotherapy: Secondary | ICD-10-CM | POA: Insufficient documentation

## 2018-08-21 DIAGNOSIS — K5900 Constipation, unspecified: Secondary | ICD-10-CM | POA: Insufficient documentation

## 2018-08-21 DIAGNOSIS — F419 Anxiety disorder, unspecified: Secondary | ICD-10-CM | POA: Diagnosis not present

## 2018-08-21 DIAGNOSIS — C9000 Multiple myeloma not having achieved remission: Secondary | ICD-10-CM

## 2018-08-21 DIAGNOSIS — M8580 Other specified disorders of bone density and structure, unspecified site: Secondary | ICD-10-CM | POA: Diagnosis not present

## 2018-08-21 DIAGNOSIS — R05 Cough: Secondary | ICD-10-CM | POA: Insufficient documentation

## 2018-08-21 LAB — COMPREHENSIVE METABOLIC PANEL
ALT: 9 U/L (ref 0–44)
AST: 9 U/L — AB (ref 15–41)
Albumin: 3.7 g/dL (ref 3.5–5.0)
Alkaline Phosphatase: 66 U/L (ref 38–126)
Anion gap: 7 (ref 5–15)
BUN: 43 mg/dL — ABNORMAL HIGH (ref 8–23)
CO2: 23 mmol/L (ref 22–32)
CREATININE: 2.21 mg/dL — AB (ref 0.44–1.00)
Calcium: 8.9 mg/dL (ref 8.9–10.3)
Chloride: 108 mmol/L (ref 98–111)
GFR calc Af Amer: 25 mL/min — ABNORMAL LOW (ref >60)
GFR calc non Af Amer: 22 mL/min — ABNORMAL LOW (ref >60)
Glucose, Bld: 152 mg/dL — ABNORMAL HIGH (ref 70–99)
Potassium: 5.4 mmol/L — ABNORMAL HIGH (ref 3.5–5.1)
Sodium: 138 mmol/L (ref 135–145)
Total Bilirubin: 0.2 mg/dL — ABNORMAL LOW (ref 0.3–1.2)
Total Protein: 6.4 g/dL — ABNORMAL LOW (ref 6.5–8.1)

## 2018-08-21 LAB — CBC WITH DIFFERENTIAL/PLATELET
ABS IMMATURE GRANULOCYTES: 0.02 10*3/uL (ref 0.00–0.07)
Basophils Absolute: 0.1 10*3/uL (ref 0.0–0.1)
Basophils Relative: 2 %
Eosinophils Absolute: 0.6 10*3/uL — ABNORMAL HIGH (ref 0.0–0.5)
Eosinophils Relative: 16 %
HCT: 27.9 % — ABNORMAL LOW (ref 36.0–46.0)
Hemoglobin: 8.6 g/dL — ABNORMAL LOW (ref 12.0–15.0)
Immature Granulocytes: 1 %
Lymphocytes Relative: 18 %
Lymphs Abs: 0.7 10*3/uL (ref 0.7–4.0)
MCH: 31.9 pg (ref 26.0–34.0)
MCHC: 30.8 g/dL (ref 30.0–36.0)
MCV: 103.3 fL — ABNORMAL HIGH (ref 80.0–100.0)
Monocytes Absolute: 0.6 10*3/uL (ref 0.1–1.0)
Monocytes Relative: 14 %
NEUTROS ABS: 2 10*3/uL (ref 1.7–7.7)
Neutrophils Relative %: 49 %
Platelets: 244 10*3/uL (ref 150–400)
RBC: 2.7 MIL/uL — ABNORMAL LOW (ref 3.87–5.11)
RDW: 14.4 % (ref 11.5–15.5)
WBC: 4 10*3/uL (ref 4.0–10.5)
nRBC: 0 % (ref 0.0–0.2)

## 2018-08-21 LAB — LACTATE DEHYDROGENASE: LDH: 114 U/L (ref 98–192)

## 2018-08-21 LAB — TSH: TSH: 1.386 u[IU]/mL (ref 0.350–4.500)

## 2018-08-21 MED ORDER — DEXTROSE 5 % IV SOLN
33.0000 mg/m2 | Freq: Once | INTRAVENOUS | Status: AC
  Administered 2018-08-21: 60 mg via INTRAVENOUS
  Filled 2018-08-21: qty 30

## 2018-08-21 MED ORDER — DENOSUMAB 120 MG/1.7ML ~~LOC~~ SOLN
120.0000 mg | Freq: Once | SUBCUTANEOUS | Status: AC
  Administered 2018-08-21: 120 mg via SUBCUTANEOUS
  Filled 2018-08-21: qty 1.7

## 2018-08-21 MED ORDER — SODIUM CHLORIDE 0.9 % IV SOLN: Freq: Once | INTRAVENOUS | Status: DC

## 2018-08-21 MED ORDER — SODIUM CHLORIDE 0.9 % IV SOLN
INTRAVENOUS | Status: DC
  Administered 2018-08-21: 10:00:00 via INTRAVENOUS

## 2018-08-21 NOTE — Assessment & Plan Note (Signed)
.1.  IgG lambda plasma cell myeloma, stage II by R-ISS: - Presentation to [facility] with left-sided rib pain on 12/29/2017, found to have hypercalcemia, anemia and acute kidney injury, further work-up showed 3.2 g/dL of IgG lambda monoclonal gammopathy. -Skeletal survey reportedly demonstrated diffuse osteopenia and multiple lytic lesions in bilateral lower extremities. -Bone marrow biopsy on 12/31/2017 showed hypercellular marrow with 40 to 50% cellularity, involved by plasma cell neoplasm (17% plasma cells by manual aspirate differential count, greater than 90% and 30 to 40% by CD138 immunohistochemical analysis of clot and core respectively, and monotypic lambda by flow cytometry analysis) - Chromosome analysis showed 12 of 20 cells examined are normal, 46, X6.  The remaining 8 metaphase cells analyzed are abnormal and represent a hyperdiploid clone containing 1 extra copy each of chromosome 3, 5, 6, 7, 9, 11, 15, 19, 20, 21, 22 as well as a marker chromosome. -Multiple myeloma FISH panel shows nuclei positive for three 1q signals, trisomy 11, and extra signals of chromosome 7, 9 and 15. - Patient was recommended to start chemotherapy with port placement by Dr. Federico Flake at [facility].  She was recommended to have IV chemotherapy as she only has Medicare part A. Patient did not follow-up at [facility] -On 04/07/2018, M spike was 4 g of IgG lambda.  LDH was 115.  Beta-2 microglobulin-12.8.  Light chain ratio is 0, Lambda light chains were elevated at 59,667. -Bone survey showed large expansile lytic lesion in the left shoulder, multiple lytic lesions in all bones. -First treatment with Arelia Sneddon was started on 04/17/2018. - Cycle 2 with KRd started on 05/15/2018.  Revlimid was 10 mg 3 weeks on 1 week off.  She received last dose of carfilzomib on 05/16/2018. - She presented to the ER on 05/20/2018 with abdominal pain and was diagnosed with appendicitis, underwent appendectomy on the same day. -She did have a  couple of admissions to the hospital after that.  She is currently pain-free. - I have reviewed CT scan of the abdomen and pelvis dated 06/10/2018 which did not show any bowel obstruction. - She was started on cycle 2 KRD on 06/12/2018 and 06/13/2018. - She had developed erythematous maculopapular rash over the trunk and extremities around 06/13/2018, and was treated with steroids.  Rash was thought to be secondary to Revlimid. - Day 15 and 16 carfilzomib was held on 07/10/2018 secondary to neutropenia, ANC of 200. - We reviewed the results of M spike on 07/10/2018.  It has come down to 0.3 g/dL.  Free light chain ratio was normal at 0.42. - Cycle 3 with carfilzomib, pomalidomide and dexamethasone started on 07/24/2018. -Pomalidomide dose was 2 mg 3 weeks on 1 week off.  She tolerated it very well.  She finished her last dose yesterday.  She had delay in obtaining medication. - We reviewed results of M spike on 07/21/2018 which has decreased to 0.2.  Light chain ratio was 0.4.  Lambda light chains were 37.9. -She was evaluated by Dr. Norma Fredrickson at [facility] for transplant. - She may proceed with cycle 4 of carfilzomib today.  She will start her pomalidomide on 08/27/2018. -I will see her back in 4 weeks for follow-up.  2.  Difficulty falling and staying asleep: -She will continue Ambien 10 mg as needed.  3.  Hypercalcemia: - She had hypercalcemia presentation.  Calcium is normal today. -Because of her renal function, she is receiving denosumab.  Last dose was 07/24/2018.  4.  Anxiety: - We have given a  refill for Xanax 0.25 mg twice daily as needed for anxiety.

## 2018-08-21 NOTE — Patient Instructions (Addendum)
[place] at [facility] Discharge Instructions  Follow up in 4 weeks with labs and treatment,    Thank you for choosing [place] at [facility] to provide your oncology and hematology care.  To afford each patient quality time with our provider, please arrive at least 15 minutes before your scheduled appointment time.   If you have a lab appointment with the [facility] please come in thru the  Main Entrance and check in at the main information desk  You need to re-schedule your appointment should you arrive 10 or more minutes late.  We strive to give you quality time with our providers, and arriving late affects you and other patients whose appointments are after yours.  Also, if you no show three or more times for appointments you may be dismissed from the clinic at the providers discretion.     Again, thank you for choosing [facility].  Our hope is that these requests will decrease the amount of time that you wait before being seen by our physicians.       _____________________________________________________________  Should you have questions after your visit to [facility], please contact our office at [phone] between the hours of 8:00 a.m. and 4:30 p.m.  Voicemails left after 4:00 p.m. will not be returned until the following business day.  For prescription refill requests, have your pharmacy contact our office and allow 72 hours.    Cancer Center Support Programs:   > Cancer Support Group  2nd Tuesday of the month 1pm-2pm, Journey Room
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
## 2018-08-21 NOTE — Progress Notes (Signed)
Tolerated infusion w/o adverse reaction.  Alert, in no distress.  VSS.  Discharged ambulatory.  

## 2018-08-22 ENCOUNTER — Inpatient Hospital Stay (HOSPITAL_COMMUNITY): Payer: Medicaid Other

## 2018-08-22 VITALS — BP 134/69 | HR 71 | Temp 98.0°F | Resp 18

## 2018-08-22 DIAGNOSIS — C9000 Multiple myeloma not having achieved remission: Secondary | ICD-10-CM

## 2018-08-22 DIAGNOSIS — Z5112 Encounter for antineoplastic immunotherapy: Secondary | ICD-10-CM | POA: Diagnosis not present

## 2018-08-22 LAB — PROTEIN ELECTROPHORESIS, SERUM
A/G Ratio: 1.5 (ref 0.7–1.7)
Albumin ELP: 3.5 g/dL (ref 2.9–4.4)
Alpha-1-Globulin: 0.2 g/dL (ref 0.0–0.4)
Alpha-2-Globulin: 1 g/dL (ref 0.4–1.0)
BETA GLOBULIN: 0.7 g/dL (ref 0.7–1.3)
Gamma Globulin: 0.4 g/dL (ref 0.4–1.8)
Globulin, Total: 2.3 g/dL (ref 2.2–3.9)
M-Spike, %: 0.1 g/dL — ABNORMAL HIGH
Total Protein ELP: 5.8 g/dL — ABNORMAL LOW (ref 6.0–8.5)

## 2018-08-22 LAB — KAPPA/LAMBDA LIGHT CHAINS
Kappa free light chain: 15.5 mg/L (ref 3.3–19.4)
Kappa, lambda light chain ratio: 0.9 (ref 0.26–1.65)
Lambda free light chains: 17.3 mg/L (ref 5.7–26.3)

## 2018-08-22 LAB — IGG, IGA, IGM
IgA: 17 mg/dL — ABNORMAL LOW (ref 87–352)
IgG (Immunoglobin G), Serum: 460 mg/dL — ABNORMAL LOW (ref 700–1600)
IgM (Immunoglobulin M), Srm: <5 mg/dL — ABNORMAL LOW (ref 26–217)

## 2018-08-22 MED ORDER — DEXTROSE 5 % IV SOLN
33.0000 mg/m2 | Freq: Once | INTRAVENOUS | Status: AC
  Administered 2018-08-22: 60 mg via INTRAVENOUS
  Filled 2018-08-22: qty 30

## 2018-08-22 MED ORDER — SODIUM CHLORIDE 0.9 % IV SOLN
Freq: Once | INTRAVENOUS | Status: AC
  Administered 2018-08-22: 11:00:00 via INTRAVENOUS

## 2018-08-22 MED ORDER — DEXAMETHASONE 4 MG PO TABS: 10.0000 mg | ORAL_TABLET | ORAL | 0 refills | Status: DC

## 2018-08-22 MED ORDER — OCTREOTIDE ACETATE 30 MG IM KIT
PACK | INTRAMUSCULAR | Status: AC
  Filled 2018-08-22: qty 1

## 2018-08-22 MED ORDER — HEPARIN SOD (PORK) LOCK FLUSH 100 UNIT/ML IV SOLN
500.0000 [IU] | Freq: Once | INTRAVENOUS | Status: AC | PRN
  Administered 2018-08-22: 500 [IU]

## 2018-08-28 ENCOUNTER — Inpatient Hospital Stay (HOSPITAL_COMMUNITY): Payer: Medicaid Other

## 2018-08-28 VITALS — BP 141/75 | HR 69 | Temp 98.7°F | Resp 18 | Wt 162.2 lb

## 2018-08-28 DIAGNOSIS — F329 Major depressive disorder, single episode, unspecified: Secondary | ICD-10-CM

## 2018-08-28 DIAGNOSIS — C9000 Multiple myeloma not having achieved remission: Secondary | ICD-10-CM

## 2018-08-28 DIAGNOSIS — Z5112 Encounter for antineoplastic immunotherapy: Secondary | ICD-10-CM | POA: Diagnosis not present

## 2018-08-28 DIAGNOSIS — F32A Depression, unspecified: Secondary | ICD-10-CM

## 2018-08-28 LAB — CBC WITH DIFFERENTIAL/PLATELET
Abs Immature Granulocytes: 0.02 10*3/uL (ref 0.00–0.07)
BASOS ABS: 0.1 10*3/uL (ref 0.0–0.1)
Basophils Relative: 1 %
Eosinophils Absolute: 0 10*3/uL (ref 0.0–0.5)
Eosinophils Relative: 0 %
HEMATOCRIT: 30.4 % — AB (ref 36.0–46.0)
Hemoglobin: 9.4 g/dL — ABNORMAL LOW (ref 12.0–15.0)
IMMATURE GRANULOCYTES: 0 %
LYMPHS ABS: 0.6 10*3/uL — AB (ref 0.7–4.0)
Lymphocytes Relative: 9 %
MCH: 31.8 pg (ref 26.0–34.0)
MCHC: 30.9 g/dL (ref 30.0–36.0)
MCV: 102.7 fL — AB (ref 80.0–100.0)
Monocytes Absolute: 0.2 10*3/uL (ref 0.1–1.0)
Monocytes Relative: 2 %
Neutro Abs: 6 10*3/uL (ref 1.7–7.7)
Neutrophils Relative %: 88 %
Platelets: 297 10*3/uL (ref 150–400)
RBC: 2.96 MIL/uL — ABNORMAL LOW (ref 3.87–5.11)
RDW: 14 % (ref 11.5–15.5)
WBC: 6.8 10*3/uL (ref 4.0–10.5)
nRBC: 0 % (ref 0.0–0.2)

## 2018-08-28 LAB — COMPREHENSIVE METABOLIC PANEL
ALT: 12 U/L (ref 0–44)
AST: 13 U/L — AB (ref 15–41)
Albumin: 4.3 g/dL (ref 3.5–5.0)
Alkaline Phosphatase: 69 U/L (ref 38–126)
Anion gap: 9 (ref 5–15)
BUN: 34 mg/dL — ABNORMAL HIGH (ref 8–23)
CO2: 24 mmol/L (ref 22–32)
Calcium: 9.6 mg/dL (ref 8.9–10.3)
Chloride: 103 mmol/L (ref 98–111)
Creatinine, Ser: 1.88 mg/dL — ABNORMAL HIGH (ref 0.44–1.00)
GFR calc Af Amer: 31 mL/min — ABNORMAL LOW (ref >60)
GFR, EST NON AFRICAN AMERICAN: 27 mL/min — AB (ref >60)
Glucose, Bld: 172 mg/dL — ABNORMAL HIGH (ref 70–99)
Potassium: 5.6 mmol/L — ABNORMAL HIGH (ref 3.5–5.1)
Sodium: 136 mmol/L (ref 135–145)
TOTAL PROTEIN: 6.8 g/dL (ref 6.5–8.1)
Total Bilirubin: 0.5 mg/dL (ref 0.3–1.2)

## 2018-08-28 MED ORDER — DEXTROSE 5 % IV SOLN
33.0000 mg/m2 | Freq: Once | INTRAVENOUS | Status: AC
  Administered 2018-08-28: 60 mg via INTRAVENOUS
  Filled 2018-08-28: qty 30

## 2018-08-28 MED ORDER — SODIUM CHLORIDE 0.9 % IV SOLN
Freq: Once | INTRAVENOUS | Status: AC
  Administered 2018-08-28: 13:00:00 via INTRAVENOUS

## 2018-08-28 MED ORDER — CITALOPRAM HYDROBROMIDE 40 MG PO TABS: 40.0000 mg | ORAL_TABLET | Freq: Every day | ORAL | 1 refills | Status: DC

## 2018-08-28 MED ORDER — ALLOPURINOL 300 MG PO TABS: 300.0000 mg | ORAL_TABLET | Freq: Every day | ORAL | 1 refills | Status: DC

## 2018-08-28 MED ORDER — SODIUM CHLORIDE 0.9% FLUSH
10.0000 mL | INTRAVENOUS | Status: DC | PRN
  Administered 2018-08-28: 10 mL
  Filled 2018-08-28: qty 10

## 2018-08-28 NOTE — Patient Instructions (Signed)
[facility] Discharge Instructions for Patients Receiving Chemotherapy   Beginning January 23rd 2017 lab work for the [facility] will be done in the  Main lab at [facility] on 1st floor. If you have a lab appointment with the [facility] please come in thru the  Main Entrance and check in at the main information desk   Today you received the following chemotherapy agents Kyprolis  To help prevent nausea and vomiting after your treatment, we encourage you to take your nausea medication   If you develop nausea and vomiting, or diarrhea that is not controlled by your medication, call the clinic.  The clinic phone number is [phone]. Office hours are Monday-Friday 8:30am-5:00pm.  BELOW ARE SYMPTOMS THAT SHOULD BE REPORTED IMMEDIATELY:  *FEVER GREATER THAN 101.0 F  *CHILLS WITH OR WITHOUT FEVER  NAUSEA AND VOMITING THAT IS NOT CONTROLLED WITH YOUR NAUSEA MEDICATION  *UNUSUAL SHORTNESS OF BREATH  *UNUSUAL BRUISING OR BLEEDING  TENDERNESS IN MOUTH AND THROAT WITH OR WITHOUT PRESENCE OF ULCERS  *URINARY PROBLEMS  *BOWEL PROBLEMS  UNUSUAL RASH Items with * indicate a potential emergency and should be followed up as soon as possible. If you have an emergency after office hours please contact your primary care physician or go to the nearest emergency department.  Please call the clinic during office hours if you have any questions or concerns.   You may also contact the Patient Navigator at [phone] should you have any questions or need assistance in obtaining follow up care.      Resources For Cancer Patients and their Caregivers ? American Cancer Society: Can assist with transportation, wigs, general needs, runs Look Good Feel Better.        ([phone] 762-5919 ? Cancer Care: Provides financial assistance, online support groups, medication/co-pay assistance.  1-800-813-HOPE (701) 064-2491) ? [facility] Assists [place] cancer  patients and their families through emotional , educational and financial support.  907-716-5009 ? [place] DSS Where to apply for food stamps, Medicaid and utility assistance. 954-840-7449 ? [facility]: Transportation to medical appointments. 778 282 5336 ? Social Security Administration: May apply for disability if have a Stage IV cancer. ([phone] 632-2434 845-624-4443 ? [employer], Disability and Transit Services: Assists with nutrition, care and transit needs. (682)280-7210

## 2018-08-28 NOTE — Progress Notes (Signed)
1320 Labs reviewed with Dr. Delton Coombes, including Creatinine of 1.88 and K+ 5.6. Pt reports she has been drinking a lot of orange juice lately and eating bananas. She has been instructed to stop. Per Dr. Delton Coombes, it is ok to proceed with Kyprolis today. Pt also requesting an increase in her Celexa dose. She states that since starting her Pomalyst, she feels more anxious. Dr. Delton Coombes made aware and Celexa increased to 40mg . Pt aware.  [name] tolerated Kyprolis without incident or complaint. VSS upon completion of treatment. Port flushed and left accessed for use tomorrow. Discharged self ambulatory in satisfactory condition.

## 2018-08-29 ENCOUNTER — Encounter (HOSPITAL_COMMUNITY): Payer: Self-pay

## 2018-08-29 ENCOUNTER — Inpatient Hospital Stay (HOSPITAL_COMMUNITY): Payer: Medicaid Other

## 2018-08-29 VITALS — BP 140/68 | HR 78 | Temp 98.2°F | Resp 18 | Wt 163.1 lb

## 2018-08-29 DIAGNOSIS — C9000 Multiple myeloma not having achieved remission: Secondary | ICD-10-CM

## 2018-08-29 DIAGNOSIS — Z5112 Encounter for antineoplastic immunotherapy: Secondary | ICD-10-CM | POA: Diagnosis not present

## 2018-08-29 MED ORDER — DEXTROSE 5 % IV SOLN
33.0000 mg/m2 | Freq: Once | INTRAVENOUS | Status: AC
  Administered 2018-08-29: 60 mg via INTRAVENOUS
  Filled 2018-08-29: qty 30

## 2018-08-29 MED ORDER — HEPARIN SOD (PORK) LOCK FLUSH 100 UNIT/ML IV SOLN
500.0000 [IU] | Freq: Once | INTRAVENOUS | Status: AC | PRN
  Administered 2018-08-29: 500 [IU]

## 2018-08-29 MED ORDER — SODIUM CHLORIDE 0.9 % IV SOLN
Freq: Once | INTRAVENOUS | Status: AC
  Administered 2018-08-29: 12:00:00 via INTRAVENOUS

## 2018-08-29 MED ORDER — SODIUM CHLORIDE 0.9% FLUSH
10.0000 mL | INTRAVENOUS | Status: DC | PRN
  Administered 2018-08-29: 10 mL
  Filled 2018-08-29: qty 10

## 2018-08-29 MED ORDER — SODIUM CHLORIDE 0.9 % IV SOLN
Freq: Once | INTRAVENOUS | Status: AC
  Administered 2018-08-29: 11:00:00 via INTRAVENOUS

## 2018-08-29 NOTE — Progress Notes (Signed)
Alexandra Price tolerated Alexandra Price infusion with hydration well without complaints or incident. VSS upon discharge. Pt discharged self ambulatory in satisfactory condition

## 2018-08-29 NOTE — Patient Instructions (Signed)
[facility] Discharge Instructions for Patients Receiving Chemotherapy   Beginning January 23rd 2017 lab work for the [facility] will be done in the  Main lab at [facility] on 1st floor. If you have a lab appointment with the [facility] please come in thru the  Main Entrance and check in at the main information desk   Today you received the following chemotherapy agents Kyprolis. Follow-up as scheduled. Call clinic for any questions or concerns  To help prevent nausea and vomiting after your treatment, we encourage you to take your nausea medication   If you develop nausea and vomiting, or diarrhea that is not controlled by your medication, call the clinic.  The clinic phone number is [phone]. Office hours are Monday-Friday 8:30am-5:00pm.  BELOW ARE SYMPTOMS THAT SHOULD BE REPORTED IMMEDIATELY:  *FEVER GREATER THAN 101.0 F  *CHILLS WITH OR WITHOUT FEVER  NAUSEA AND VOMITING THAT IS NOT CONTROLLED WITH YOUR NAUSEA MEDICATION  *UNUSUAL SHORTNESS OF BREATH  *UNUSUAL BRUISING OR BLEEDING  TENDERNESS IN MOUTH AND THROAT WITH OR WITHOUT PRESENCE OF ULCERS  *URINARY PROBLEMS  *BOWEL PROBLEMS  UNUSUAL RASH Items with * indicate a potential emergency and should be followed up as soon as possible. If you have an emergency after office hours please contact your primary care physician or go to the nearest emergency department.  Please call the clinic during office hours if you have any questions or concerns.   You may also contact the Patient Navigator at [phone] should you have any questions or need assistance in obtaining follow up care.      Resources For Cancer Patients and their Caregivers ? American Cancer Society: Can assist with transportation, wigs, general needs, runs Look Good Feel Better.        2400288053 ? Cancer Care: Provides financial assistance, online support groups, medication/co-pay assistance.  1-800-813-HOPE  (207) 305-0688) ? [place] Assists [place] cancer patients and their families through emotional , educational and financial support.  640-093-4599 ? [place] DSS Where to apply for food stamps, Medicaid and utility assistance. (925) 325-9491 ? [facility]: Transportation to medical appointments. 775-746-2515 ? Social Security Administration: May apply for disability if have a Stage IV cancer. [phone] 763-146-2132 ? [employer], Disability and Transit Services: Assists with nutrition, care and transit needs. (234)394-6921

## 2018-09-04 ENCOUNTER — Inpatient Hospital Stay (HOSPITAL_COMMUNITY): Payer: Medicaid Other

## 2018-09-04 ENCOUNTER — Encounter (HOSPITAL_COMMUNITY): Payer: Self-pay

## 2018-09-04 ENCOUNTER — Encounter (HOSPITAL_COMMUNITY): Payer: Self-pay | Admitting: *Deleted

## 2018-09-04 ENCOUNTER — Other Ambulatory Visit: Payer: Self-pay

## 2018-09-04 VITALS — BP 138/55 | HR 66 | Temp 97.9°F | Resp 18 | Wt 166.0 lb

## 2018-09-04 DIAGNOSIS — C9000 Multiple myeloma not having achieved remission: Secondary | ICD-10-CM

## 2018-09-04 DIAGNOSIS — Z5112 Encounter for antineoplastic immunotherapy: Secondary | ICD-10-CM | POA: Diagnosis not present

## 2018-09-04 LAB — CBC WITH DIFFERENTIAL/PLATELET
Abs Immature Granulocytes: 0.11 10*3/uL — ABNORMAL HIGH (ref 0.00–0.07)
Basophils Absolute: 0.1 10*3/uL (ref 0.0–0.1)
Basophils Relative: 1 %
Eosinophils Absolute: 0.9 10*3/uL — ABNORMAL HIGH (ref 0.0–0.5)
Eosinophils Relative: 15 %
HCT: 27.8 % — ABNORMAL LOW (ref 36.0–46.0)
Hemoglobin: 8.5 g/dL — ABNORMAL LOW (ref 12.0–15.0)
IMMATURE GRANULOCYTES: 2 %
Lymphocytes Relative: 13 %
Lymphs Abs: 0.8 10*3/uL (ref 0.7–4.0)
MCH: 31.5 pg (ref 26.0–34.0)
MCHC: 30.6 g/dL (ref 30.0–36.0)
MCV: 103 fL — AB (ref 80.0–100.0)
Monocytes Absolute: 0.5 10*3/uL (ref 0.1–1.0)
Monocytes Relative: 7 %
Neutro Abs: 3.9 10*3/uL (ref 1.7–7.7)
Neutrophils Relative %: 62 %
Platelets: 197 10*3/uL (ref 150–400)
RBC: 2.7 MIL/uL — ABNORMAL LOW (ref 3.87–5.11)
RDW: 15 % (ref 11.5–15.5)
WBC: 6.2 10*3/uL (ref 4.0–10.5)
nRBC: 0 % (ref 0.0–0.2)

## 2018-09-04 LAB — COMPREHENSIVE METABOLIC PANEL
ALT: 9 U/L (ref 0–44)
AST: 10 U/L — ABNORMAL LOW (ref 15–41)
Albumin: 3.7 g/dL (ref 3.5–5.0)
Alkaline Phosphatase: 60 U/L (ref 38–126)
Anion gap: 7 (ref 5–15)
BUN: 24 mg/dL — ABNORMAL HIGH (ref 8–23)
CO2: 25 mmol/L (ref 22–32)
Calcium: 8.5 mg/dL — ABNORMAL LOW (ref 8.9–10.3)
Chloride: 106 mmol/L (ref 98–111)
Creatinine, Ser: 1.84 mg/dL — ABNORMAL HIGH (ref 0.44–1.00)
GFR calc Af Amer: 32 mL/min — ABNORMAL LOW (ref >60)
GFR, EST NON AFRICAN AMERICAN: 27 mL/min — AB (ref >60)
Glucose, Bld: 108 mg/dL — ABNORMAL HIGH (ref 70–99)
Potassium: 4.8 mmol/L (ref 3.5–5.1)
Sodium: 138 mmol/L (ref 135–145)
TOTAL PROTEIN: 6 g/dL — AB (ref 6.5–8.1)
Total Bilirubin: 0.6 mg/dL (ref 0.3–1.2)

## 2018-09-04 MED ORDER — DEXTROSE 5 % IV SOLN
33.0000 mg/m2 | Freq: Once | INTRAVENOUS | Status: AC
  Administered 2018-09-04: 60 mg via INTRAVENOUS
  Filled 2018-09-04: qty 30

## 2018-09-04 MED ORDER — ACYCLOVIR 400 MG PO TABS: 400.0000 mg | ORAL_TABLET | Freq: Two times a day (BID) | ORAL | 3 refills | Status: DC

## 2018-09-04 MED ORDER — SODIUM CHLORIDE 0.9 % IV SOLN
Freq: Once | INTRAVENOUS | Status: AC
  Administered 2018-09-04: 11:00:00 via INTRAVENOUS

## 2018-09-04 MED ORDER — SODIUM CHLORIDE 0.9% FLUSH: 10.0000 mL | INTRAVENOUS | Status: DC | PRN

## 2018-09-04 MED ORDER — HEPARIN SOD (PORK) LOCK FLUSH 100 UNIT/ML IV SOLN: 500.0000 [IU] | Freq: Once | INTRAVENOUS | Status: DC | PRN

## 2018-09-04 MED ORDER — HYDROCODONE-ACETAMINOPHEN 5-325 MG PO TABS: 1.0000 | ORAL_TABLET | Freq: Three times a day (TID) | ORAL | 0 refills | Status: DC | PRN

## 2018-09-04 NOTE — Patient Instructions (Signed)
[facility] Discharge Instructions for Patients Receiving Chemotherapy  Today you received the following chemotherapy agents   To help prevent nausea and vomiting after your treatment, we encourage you to take your nausea medication   If you develop nausea and vomiting that is not controlled by your nausea medication, call the clinic.   BELOW ARE SYMPTOMS THAT SHOULD BE REPORTED IMMEDIATELY:  *FEVER GREATER THAN 100.5 F  *CHILLS WITH OR WITHOUT FEVER  NAUSEA AND VOMITING THAT IS NOT CONTROLLED WITH YOUR NAUSEA MEDICATION  *UNUSUAL SHORTNESS OF BREATH  *UNUSUAL BRUISING OR BLEEDING  TENDERNESS IN MOUTH AND THROAT WITH OR WITHOUT PRESENCE OF ULCERS  *URINARY PROBLEMS  *BOWEL PROBLEMS  UNUSUAL RASH Items with * indicate a potential emergency and should be followed up as soon as possible.  Feel free to call the clinic should you have any questions or concerns. The clinic phone number is [phone].  Please show the CHEMO ALERT CARD at check-in to the Emergency Department and triage nurse.   

## 2018-09-04 NOTE — Progress Notes (Signed)
Pt presents today for Day 15 Cycle 4. VSS. No complaints of any changes since the last visit.   Labs reviewed with Dr. Delton Coombes. VO received to proceed with treatment. Reported Creatinine of 1.84 elevated. Parameter >1.5. Per MD, " may proceed with treatment at this time". Reported to [facility] NP that pt needs refills on Hydrocodone and Acyclovir medication.   Pomalyst taken today per patient as prescribed by physician. Pt has not missed any doses per pt's words.   Treatment given today per MD orders. Tolerated infusion without adverse affects. Vital signs stable. No complaints at this time. Discharged from clinic ambulatory. F/U with [facility] as scheduled.

## 2018-09-04 NOTE — Progress Notes (Signed)
I spoke with Urban Gibson, RN transplant coordinator at [facility], she states that patient is coming to see Dr. Tiana Loft for evaluation at 1/29.  He wants her to finish the current cycle and she does not need to start a new cycle. They will be working her up to move forward for transplant.    I have updated Dr. Delton Coombes.

## 2018-09-05 ENCOUNTER — Inpatient Hospital Stay (HOSPITAL_COMMUNITY): Payer: Medicaid Other

## 2018-09-05 VITALS — BP 155/70 | HR 80 | Temp 97.7°F | Resp 18 | Wt 167.6 lb

## 2018-09-05 DIAGNOSIS — Z5112 Encounter for antineoplastic immunotherapy: Secondary | ICD-10-CM | POA: Diagnosis not present

## 2018-09-05 DIAGNOSIS — G479 Sleep disorder, unspecified: Secondary | ICD-10-CM

## 2018-09-05 DIAGNOSIS — C9000 Multiple myeloma not having achieved remission: Secondary | ICD-10-CM

## 2018-09-05 MED ORDER — SODIUM CHLORIDE 0.9% FLUSH
10.0000 mL | INTRAVENOUS | Status: DC | PRN
  Administered 2018-09-05 (×2): 10 mL
  Filled 2018-09-05 (×2): qty 10

## 2018-09-05 MED ORDER — HEPARIN SOD (PORK) LOCK FLUSH 100 UNIT/ML IV SOLN
500.0000 [IU] | Freq: Once | INTRAVENOUS | Status: AC | PRN
  Administered 2018-09-05: 500 [IU]

## 2018-09-05 MED ORDER — SODIUM CHLORIDE 0.9 % IV SOLN
Freq: Once | INTRAVENOUS | Status: AC
  Administered 2018-09-05: 13:00:00 via INTRAVENOUS

## 2018-09-05 MED ORDER — ZOLPIDEM TARTRATE 10 MG PO TABS: 10.0000 mg | ORAL_TABLET | Freq: Every evening | ORAL | 0 refills | Status: DC | PRN

## 2018-09-05 MED ORDER — DEXTROSE 5 % IV SOLN
33.0000 mg/m2 | Freq: Once | INTRAVENOUS | Status: AC
  Administered 2018-09-05: 60 mg via INTRAVENOUS
  Filled 2018-09-05: qty 30

## 2018-09-05 NOTE — Progress Notes (Signed)
Treatment given today per MD orders. Tolerated infusion without adverse affects. Vital signs stable. No complaints at this time. Per Dr. Delton Coombes, " Pt is to continue Pomalyst this cycle and finish, and take Acyclovir until TransplantTeam instructs differently.  Discharged from clinic ambulatory. F/U with [facility] as scheduled.

## 2018-09-05 NOTE — Patient Instructions (Signed)
[facility] Discharge Instructions for Patients Receiving Chemotherapy  Today you received the following chemotherapy agents   To help prevent nausea and vomiting after your treatment, we encourage you to take your nausea medication   If you develop nausea and vomiting that is not controlled by your nausea medication, call the clinic.   BELOW ARE SYMPTOMS THAT SHOULD BE REPORTED IMMEDIATELY:  *FEVER GREATER THAN 100.5 F  *CHILLS WITH OR WITHOUT FEVER  NAUSEA AND VOMITING THAT IS NOT CONTROLLED WITH YOUR NAUSEA MEDICATION  *UNUSUAL SHORTNESS OF BREATH  *UNUSUAL BRUISING OR BLEEDING  TENDERNESS IN MOUTH AND THROAT WITH OR WITHOUT PRESENCE OF ULCERS  *URINARY PROBLEMS  *BOWEL PROBLEMS  UNUSUAL RASH Items with * indicate a potential emergency and should be followed up as soon as possible.  Feel free to call the clinic should you have any questions or concerns. The clinic phone number is [phone].  Please show the CHEMO ALERT CARD at check-in to the Emergency Department and triage nurse.   

## 2018-09-08 ENCOUNTER — Other Ambulatory Visit (HOSPITAL_COMMUNITY): Payer: Self-pay | Admitting: Nurse Practitioner

## 2018-09-08 ENCOUNTER — Telehealth (HOSPITAL_COMMUNITY): Payer: Self-pay | Admitting: *Deleted

## 2018-09-08 ENCOUNTER — Inpatient Hospital Stay (HOSPITAL_COMMUNITY): Payer: Medicaid Other

## 2018-09-08 ENCOUNTER — Telehealth (HOSPITAL_COMMUNITY): Payer: Self-pay | Admitting: Surgery

## 2018-09-08 ENCOUNTER — Ambulatory Visit (HOSPITAL_COMMUNITY)
Admission: RE | Admit: 2018-09-08 | Discharge: 2018-09-08 | Disposition: A | Discharge status: Home / Self Care | Payer: Medicaid Other | Source: Ambulatory Visit | Attending: Nurse Practitioner | Admitting: Nurse Practitioner

## 2018-09-08 DIAGNOSIS — R058 Other specified cough: Secondary | ICD-10-CM

## 2018-09-08 DIAGNOSIS — Z5112 Encounter for antineoplastic immunotherapy: Secondary | ICD-10-CM | POA: Diagnosis not present

## 2018-09-08 DIAGNOSIS — R05 Cough: Secondary | ICD-10-CM | POA: Diagnosis not present

## 2018-09-08 DIAGNOSIS — R509 Fever, unspecified: Secondary | ICD-10-CM

## 2018-09-08 LAB — COMPREHENSIVE METABOLIC PANEL
ALT: 10 U/L (ref 0–44)
AST: 13 U/L — ABNORMAL LOW (ref 15–41)
Albumin: 3.7 g/dL (ref 3.5–5.0)
Alkaline Phosphatase: 51 U/L (ref 38–126)
Anion gap: 9 (ref 5–15)
BUN: 26 mg/dL — AB (ref 8–23)
CO2: 21 mmol/L — ABNORMAL LOW (ref 22–32)
Calcium: 8.1 mg/dL — ABNORMAL LOW (ref 8.9–10.3)
Chloride: 103 mmol/L (ref 98–111)
Creatinine, Ser: 1.86 mg/dL — ABNORMAL HIGH (ref 0.44–1.00)
GFR calc Af Amer: 31 mL/min — ABNORMAL LOW (ref >60)
GFR calc non Af Amer: 27 mL/min — ABNORMAL LOW (ref >60)
Glucose, Bld: 122 mg/dL — ABNORMAL HIGH (ref 70–99)
Potassium: 5.5 mmol/L — ABNORMAL HIGH (ref 3.5–5.1)
Sodium: 133 mmol/L — ABNORMAL LOW (ref 135–145)
Total Bilirubin: 0.9 mg/dL (ref 0.3–1.2)
Total Protein: 6.3 g/dL — ABNORMAL LOW (ref 6.5–8.1)

## 2018-09-08 LAB — CBC WITH DIFFERENTIAL/PLATELET
Abs Immature Granulocytes: 0.1 10*3/uL — ABNORMAL HIGH (ref 0.00–0.07)
Basophils Absolute: 0 10*3/uL (ref 0.0–0.1)
Basophils Relative: 0 %
EOS ABS: 0 10*3/uL (ref 0.0–0.5)
Eosinophils Relative: 0 %
HEMATOCRIT: 29 % — AB (ref 36.0–46.0)
Hemoglobin: 9 g/dL — ABNORMAL LOW (ref 12.0–15.0)
IMMATURE GRANULOCYTES: 1 %
LYMPHS ABS: 0.3 10*3/uL — AB (ref 0.7–4.0)
Lymphocytes Relative: 3 %
MCH: 30.9 pg (ref 26.0–34.0)
MCHC: 31 g/dL (ref 30.0–36.0)
MCV: 99.7 fL (ref 80.0–100.0)
Monocytes Absolute: 0.3 10*3/uL (ref 0.1–1.0)
Monocytes Relative: 3 %
Neutro Abs: 10.7 10*3/uL — ABNORMAL HIGH (ref 1.7–7.7)
Neutrophils Relative %: 93 %
Platelets: 167 10*3/uL (ref 150–400)
RBC: 2.91 MIL/uL — ABNORMAL LOW (ref 3.87–5.11)
RDW: 14.9 % (ref 11.5–15.5)
WBC: 11.5 10*3/uL — ABNORMAL HIGH (ref 4.0–10.5)
nRBC: 0.3 % — ABNORMAL HIGH (ref 0.0–0.2)

## 2018-09-08 MED ORDER — LEVOFLOXACIN 250 MG PO TABS: 250.0000 mg | ORAL_TABLET | Freq: Every day | ORAL | 0 refills | Status: DC

## 2018-09-08 NOTE — Telephone Encounter (Signed)
Tried to call pt-no voicemail set up.  Randi wanted me to tell pt that she has the beginning stages of pneumonia and that the antibiotic Levaquin has been called in for her.  Per [name], the pt is to take 2 tablets on the 1st day and 1 tablet on the remaining 6 days.

## 2018-09-08 NOTE — Telephone Encounter (Signed)
Notified pt that she did not need to have an appointment for her chest x-ray-that she can go to radiology before 5. Pt told me at this time that she had a fever of 101.  Per [name], pt needs to come today for chest x-ray as well as labwork, and pt was instructed to take Tylenol or Ibuprofen for her fever. Pt verbalized understanding.

## 2018-09-08 NOTE — Consult Note (Signed)
Patient called stating she had a productive cough with yellow greenish sputum. She has a fever of 101. We brought her in for a chest xray and blood cultures.

## 2018-09-08 NOTE — Telephone Encounter (Signed)
Pt had called the office due to nausea, coughing, and her throat hurting.  Pt states she has been afebrile. Spoke to Sarah Ann about the pt's symptoms, and since the pt was coughing up yellow sputum, Randi recommended a chest x-ray.  Told pt that we would call her to schedule the x-ray and would also call with her results. Pt verbalized understanding.

## 2018-09-09 ENCOUNTER — Telehealth (HOSPITAL_COMMUNITY): Payer: Self-pay

## 2018-09-09 NOTE — Telephone Encounter (Signed)
[place] for verification of Ambien and Levaquin scripts.  Prescriptions were received per pharmacy worker.  Called the patient with results of CXR and prescription for levaquin and reviewed eating a yogurt due to side effects of this antibiotic.  Patient verbalized understanding.

## 2018-09-11 ENCOUNTER — Other Ambulatory Visit (HOSPITAL_COMMUNITY): Payer: Self-pay | Admitting: Nurse Practitioner

## 2018-09-11 ENCOUNTER — Encounter (HOSPITAL_COMMUNITY): Payer: Self-pay | Admitting: *Deleted

## 2018-09-11 DIAGNOSIS — R058 Other specified cough: Secondary | ICD-10-CM

## 2018-09-11 DIAGNOSIS — R05 Cough: Secondary | ICD-10-CM

## 2018-09-11 MED ORDER — HYDROCOD POLST-CPM POLST ER 10-8 MG/5ML PO SUER: 5.0000 mL | Freq: Two times a day (BID) | ORAL | 0 refills | Status: DC

## 2018-09-14 LAB — CULTURE, BLOOD (ROUTINE X 2)
Culture: NO GROWTH
Culture: NO GROWTH
SPECIAL REQUESTS: ADEQUATE
Special Requests: ADEQUATE

## 2018-09-18 ENCOUNTER — Encounter (HOSPITAL_COMMUNITY): Payer: Self-pay | Admitting: Hematology

## 2018-09-18 ENCOUNTER — Inpatient Hospital Stay (HOSPITAL_BASED_OUTPATIENT_CLINIC_OR_DEPARTMENT_OTHER): Payer: Medicaid Other | Admitting: Hematology

## 2018-09-18 ENCOUNTER — Ambulatory Visit (HOSPITAL_COMMUNITY): Payer: Self-pay

## 2018-09-18 ENCOUNTER — Inpatient Hospital Stay (HOSPITAL_COMMUNITY): Payer: Medicaid Other

## 2018-09-18 ENCOUNTER — Other Ambulatory Visit: Payer: Self-pay

## 2018-09-18 VITALS — BP 124/59 | HR 91 | Temp 98.4°F | Resp 16 | Wt 156.3 lb

## 2018-09-18 DIAGNOSIS — K5900 Constipation, unspecified: Secondary | ICD-10-CM

## 2018-09-18 DIAGNOSIS — Z5112 Encounter for antineoplastic immunotherapy: Secondary | ICD-10-CM | POA: Diagnosis not present

## 2018-09-18 DIAGNOSIS — R42 Dizziness and giddiness: Secondary | ICD-10-CM

## 2018-09-18 DIAGNOSIS — C9000 Multiple myeloma not having achieved remission: Secondary | ICD-10-CM

## 2018-09-18 DIAGNOSIS — R05 Cough: Secondary | ICD-10-CM | POA: Diagnosis not present

## 2018-09-18 DIAGNOSIS — F419 Anxiety disorder, unspecified: Secondary | ICD-10-CM

## 2018-09-18 DIAGNOSIS — R058 Other specified cough: Secondary | ICD-10-CM

## 2018-09-18 LAB — COMPREHENSIVE METABOLIC PANEL
ALT: 9 U/L (ref 0–44)
ANION GAP: 8 (ref 5–15)
AST: 12 U/L — ABNORMAL LOW (ref 15–41)
Albumin: 4.2 g/dL (ref 3.5–5.0)
Alkaline Phosphatase: 58 U/L (ref 38–126)
BUN: 32 mg/dL — ABNORMAL HIGH (ref 8–23)
CO2: 24 mmol/L (ref 22–32)
Calcium: 8.7 mg/dL — ABNORMAL LOW (ref 8.9–10.3)
Chloride: 101 mmol/L (ref 98–111)
Creatinine, Ser: 2.07 mg/dL — ABNORMAL HIGH (ref 0.44–1.00)
GFR calc Af Amer: 27 mL/min — ABNORMAL LOW (ref >60)
GFR calc non Af Amer: 24 mL/min — ABNORMAL LOW (ref >60)
Glucose, Bld: 103 mg/dL — ABNORMAL HIGH (ref 70–99)
Potassium: 5.3 mmol/L — ABNORMAL HIGH (ref 3.5–5.1)
Sodium: 133 mmol/L — ABNORMAL LOW (ref 135–145)
Total Bilirubin: 0.4 mg/dL (ref 0.3–1.2)
Total Protein: 6.7 g/dL (ref 6.5–8.1)

## 2018-09-18 LAB — CBC WITH DIFFERENTIAL/PLATELET
Abs Immature Granulocytes: 0.01 10*3/uL (ref 0.00–0.07)
Basophils Absolute: 0.1 10*3/uL (ref 0.0–0.1)
Basophils Relative: 2 %
Eosinophils Absolute: 0.4 10*3/uL (ref 0.0–0.5)
Eosinophils Relative: 9 %
HCT: 28.1 % — ABNORMAL LOW (ref 36.0–46.0)
Hemoglobin: 8.7 g/dL — ABNORMAL LOW (ref 12.0–15.0)
Immature Granulocytes: 0 %
Lymphocytes Relative: 25 %
Lymphs Abs: 1.2 10*3/uL (ref 0.7–4.0)
MCH: 31 pg (ref 26.0–34.0)
MCHC: 31 g/dL (ref 30.0–36.0)
MCV: 100 fL (ref 80.0–100.0)
Monocytes Absolute: 0.8 10*3/uL (ref 0.1–1.0)
Monocytes Relative: 16 %
Neutro Abs: 2.4 10*3/uL (ref 1.7–7.7)
Neutrophils Relative %: 48 %
PLATELETS: 321 10*3/uL (ref 150–400)
RBC: 2.81 MIL/uL — ABNORMAL LOW (ref 3.87–5.11)
RDW: 15 % (ref 11.5–15.5)
WBC: 4.9 10*3/uL (ref 4.0–10.5)
nRBC: 0 % (ref 0.0–0.2)

## 2018-09-18 LAB — LACTATE DEHYDROGENASE: LDH: 121 U/L (ref 98–192)

## 2018-09-18 LAB — TSH: TSH: 1.023 u[IU]/mL (ref 0.350–4.500)

## 2018-09-18 MED ORDER — GLIPIZIDE ER 5 MG PO TB24: 5.0000 mg | ORAL_TABLET | Freq: Every day | ORAL | 1 refills | Status: DC

## 2018-09-18 MED ORDER — PANTOPRAZOLE SODIUM 40 MG PO TBEC: 40.0000 mg | DELAYED_RELEASE_TABLET | Freq: Every day | ORAL | 1 refills | Status: DC

## 2018-09-18 MED ORDER — BENZONATATE 100 MG PO CAPS: 100.0000 mg | ORAL_CAPSULE | Freq: Three times a day (TID) | ORAL | 0 refills | Status: DC | PRN

## 2018-09-18 MED ORDER — ALPRAZOLAM 0.25 MG PO TABS: 0.2500 mg | ORAL_TABLET | Freq: Two times a day (BID) | ORAL | 0 refills | Status: DC | PRN

## 2018-09-18 NOTE — Assessment & Plan Note (Signed)
.1.  IgG lambda plasma cell myeloma, stage II by R-ISS: - Presentation to [facility] with left-sided rib pain on 12/29/2017, found to have hypercalcemia, anemia and acute kidney injury, further work-up showed 3.2 g/dL of IgG lambda monoclonal gammopathy. -Skeletal survey reportedly demonstrated diffuse osteopenia and multiple lytic lesions in bilateral lower extremities. -Bone marrow biopsy on 12/31/2017 showed hypercellular marrow with 40 to 50% cellularity, involved by plasma cell neoplasm (17% plasma cells by manual aspirate differential count, greater than 90% and 30 to 40% by CD138 immunohistochemical analysis of clot and core respectively, and monotypic lambda by flow cytometry analysis) - Chromosome analysis showed 12 of 20 cells examined are normal, 46, X6.  The remaining 8 metaphase cells analyzed are abnormal and represent a hyperdiploid clone containing 1 extra copy each of chromosome 3, 5, 6, 7, 9, 11, 15, 19, 20, 21, 22 as well as a marker chromosome. -Multiple myeloma FISH panel shows nuclei positive for three 1q signals, trisomy 11, and extra signals of chromosome 7, 9 and 15. - Patient was recommended to start chemotherapy with port placement by Dr. Federico Flake at [facility].  She was recommended to have IV chemotherapy as she only has Medicare part A. Patient did not follow-up at [facility] -On 04/07/2018, M spike was 4 g of IgG lambda.  LDH was 115.  Beta-2 microglobulin-12.8.  Light chain ratio is 0, Lambda light chains were elevated at 59,667. -Bone survey showed large expansile lytic lesion in the left shoulder, multiple lytic lesions in all bones. -First treatment with Arelia Sneddon was started on 04/17/2018. - Cycle 2 with KRd started on 05/15/2018.  Revlimid was 10 mg 3 weeks on 1 week off.  She received last dose of carfilzomib on 05/16/2018. - She presented to the ER on 05/20/2018 with abdominal pain and was diagnosed with appendicitis, underwent appendectomy on the same day. -She did have a  couple of admissions to the hospital after that.  She is currently pain-free. - I have reviewed CT scan of the abdomen and pelvis dated 06/10/2018 which did not show any bowel obstruction. - She was started on cycle 2 KRD on 06/12/2018 and 06/13/2018. - She had developed erythematous maculopapular rash over the trunk and extremities around 06/13/2018, and was treated with steroids.  Rash was thought to be secondary to Revlimid. - Day 15 and 16 carfilzomib was held on 07/10/2018 secondary to neutropenia, ANC of 200. - We reviewed the results of M spike on 07/10/2018.  It has come down to 0.3 g/dL.  Free light chain ratio was normal at 0.42. - Cycle 3 with carfilzomib, pomalidomide and dexamethasone started on 07/24/2018. -Pomalidomide dose was 2 mg 3 weeks on 1 week off.  She tolerated it very well.  She finished her last dose yesterday.  She had delay in obtaining medication. - M spike on 07/21/2018 shows 0.2 g/dL.  Light chain ratio was 0.4.  Lambda light chains were 37.9.   -She had transplant work-up including bone marrow biopsy yesterday at [facility]. - Cycle 4 of carfilzomib, pomalidomide and dexamethasone on 08/21/2018. -We have sent myeloma panel from today which is pending.  She will follow-up with Dr. Norma Fredrickson in the next few days. -She will be seen back after stem cell transplant. -She has hyperkalemia for the past 1 month.  I have told her to discontinue lisinopril as her blood pressure is adequately controlled.  2.  Insomnia: -She will continue Ambien 10 mg as needed.  3.  Hypercalcemia: - She had hypercalcemia  at presentation.  Calcium is normal today. -She was started on denosumab.  Last dose of denosumab was on 08/21/2018.  4.  Anxiety: - She will use Xanax 0.25 mg twice daily as needed.  We will send a refill.

## 2018-09-18 NOTE — Patient Instructions (Signed)
[facility] Discharge Instructions     Thank you for choosing [facility] at [facility] to provide your oncology and hematology care.  To afford each patient quality time with our provider, please arrive at least 15 minutes before your scheduled appointment time.   If you have a lab appointment with the Cancer Center please come in thru the  Main Entrance and check in at the main information desk  You need to re-schedule your appointment should you arrive 10 or more minutes late.  We strive to give you quality time with our providers, and arriving late affects you and other patients whose appointments are after yours.  Also, if you no show three or more times for appointments you may be dismissed from the clinic at the providers discretion.     Again, thank you for choosing [facility].  Our hope is that these requests will decrease the amount of time that you wait before being seen by our physicians.       _____________________________________________________________  Should you have questions after your visit to [facility], please contact our office at [phone] between the hours of 8:00 a.m. and 4:30 p.m.  Voicemails left after 4:00 p.m. will not be returned until the following business day.  For prescription refill requests, have your pharmacy contact our office and allow 72 hours.    Cancer Center Support Programs:   > Cancer Support Group  2nd Tuesday of the month 1pm-2pm, Journey Room    
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
## 2018-09-19 ENCOUNTER — Ambulatory Visit (HOSPITAL_COMMUNITY): Payer: Self-pay

## 2018-09-19 LAB — IGG, IGA, IGM
IGG (IMMUNOGLOBIN G), SERUM: 485 mg/dL — AB (ref 700–1600)
IgA: 63 mg/dL — ABNORMAL LOW (ref 87–352)
IgM (Immunoglobulin M), Srm: 7 mg/dL — ABNORMAL LOW (ref 26–217)

## 2018-09-19 LAB — KAPPA/LAMBDA LIGHT CHAINS
Kappa free light chain: 21.6 mg/L — ABNORMAL HIGH (ref 3.3–19.4)
Kappa, lambda light chain ratio: 1.15 (ref 0.26–1.65)
Lambda free light chains: 18.8 mg/L (ref 5.7–26.3)

## 2018-09-20 LAB — PROTEIN ELECTROPHORESIS, SERUM
A/G Ratio: 1.6 (ref 0.7–1.7)
Albumin ELP: 3.8 g/dL (ref 2.9–4.4)
Alpha-1-Globulin: 0.3 g/dL (ref 0.0–0.4)
Alpha-2-Globulin: 1.1 g/dL — ABNORMAL HIGH (ref 0.4–1.0)
Beta Globulin: 0.7 g/dL (ref 0.7–1.3)
Gamma Globulin: 0.3 g/dL — ABNORMAL LOW (ref 0.4–1.8)
Globulin, Total: 2.4 g/dL (ref 2.2–3.9)
M-Spike, %: 0.1 g/dL — ABNORMAL HIGH
Total Protein ELP: 6.2 g/dL (ref 6.0–8.5)

## 2018-09-22 ENCOUNTER — Encounter (HOSPITAL_COMMUNITY): Payer: Self-pay | Admitting: *Deleted

## 2018-09-22 NOTE — Progress Notes (Signed)
Patient called c/o headache, nausea. She states that she hasn't been taking anything and wants to know what to take.  She was advised to take her nausea medication, compazine, as ordered and she can also take a benadryl and pain medication for the headache. I advised her to call us back should this not help her.

## 2018-09-25 ENCOUNTER — Ambulatory Visit (HOSPITAL_COMMUNITY): Payer: Self-pay

## 2018-09-25 ENCOUNTER — Other Ambulatory Visit (HOSPITAL_COMMUNITY): Payer: Self-pay

## 2018-09-26 ENCOUNTER — Ambulatory Visit (HOSPITAL_COMMUNITY): Payer: Self-pay

## 2018-10-01 ENCOUNTER — Other Ambulatory Visit (HOSPITAL_COMMUNITY): Payer: Self-pay | Admitting: Nurse Practitioner

## 2018-10-01 ENCOUNTER — Encounter (HOSPITAL_COMMUNITY): Payer: Self-pay | Admitting: Psychiatry

## 2018-10-01 DIAGNOSIS — C9000 Multiple myeloma not having achieved remission: Secondary | ICD-10-CM

## 2018-10-01 MED ORDER — HYDROCODONE-ACETAMINOPHEN 5-325 MG PO TABS: 1.0000 | ORAL_TABLET | Freq: Three times a day (TID) | ORAL | 0 refills | Status: DC | PRN

## 2018-10-02 ENCOUNTER — Other Ambulatory Visit (HOSPITAL_COMMUNITY): Payer: Self-pay

## 2018-10-02 ENCOUNTER — Ambulatory Visit (HOSPITAL_COMMUNITY): Payer: Self-pay

## 2018-10-02 ENCOUNTER — Other Ambulatory Visit (HOSPITAL_COMMUNITY): Payer: Self-pay | Admitting: Emergency Medicine

## 2018-10-02 DIAGNOSIS — C9000 Multiple myeloma not having achieved remission: Secondary | ICD-10-CM

## 2018-10-02 MED ORDER — LISINOPRIL 10 MG PO TABS: 10.0000 mg | ORAL_TABLET | Freq: Every day | ORAL | 1 refills | Status: DC

## 2018-10-02 MED ORDER — GLIPIZIDE ER 5 MG PO TB24: 5.0000 mg | ORAL_TABLET | Freq: Every day | ORAL | 1 refills | Status: DC

## 2018-10-03 ENCOUNTER — Ambulatory Visit (HOSPITAL_COMMUNITY): Payer: Self-pay

## 2018-10-08 ENCOUNTER — Inpatient Hospital Stay (HOSPITAL_COMMUNITY): Payer: Medicaid Other | Attending: Hematology

## 2018-10-08 ENCOUNTER — Other Ambulatory Visit (HOSPITAL_COMMUNITY): Payer: Self-pay | Admitting: *Deleted

## 2018-10-08 ENCOUNTER — Other Ambulatory Visit (HOSPITAL_COMMUNITY): Payer: Self-pay | Admitting: Nurse Practitioner

## 2018-10-08 DIAGNOSIS — C9000 Multiple myeloma not having achieved remission: Secondary | ICD-10-CM | POA: Diagnosis not present

## 2018-10-08 DIAGNOSIS — G479 Sleep disorder, unspecified: Secondary | ICD-10-CM

## 2018-10-08 DIAGNOSIS — R42 Dizziness and giddiness: Secondary | ICD-10-CM

## 2018-10-08 LAB — COMPREHENSIVE METABOLIC PANEL
ALBUMIN: 4 g/dL (ref 3.5–5.0)
ALT: 10 U/L (ref 0–44)
AST: 15 U/L (ref 15–41)
Alkaline Phosphatase: 49 U/L (ref 38–126)
Anion gap: 9 (ref 5–15)
BUN: 17 mg/dL (ref 8–23)
CO2: 25 mmol/L (ref 22–32)
Calcium: 9.2 mg/dL (ref 8.9–10.3)
Chloride: 106 mmol/L (ref 98–111)
Creatinine, Ser: 1.75 mg/dL — ABNORMAL HIGH (ref 0.44–1.00)
GFR calc Af Amer: 34 mL/min — ABNORMAL LOW (ref >60)
GFR calc non Af Amer: 29 mL/min — ABNORMAL LOW (ref >60)
GLUCOSE: 171 mg/dL — AB (ref 70–99)
Potassium: 5.4 mmol/L — ABNORMAL HIGH (ref 3.5–5.1)
Sodium: 140 mmol/L (ref 135–145)
Total Bilirubin: 0.4 mg/dL (ref 0.3–1.2)
Total Protein: 6.4 g/dL — ABNORMAL LOW (ref 6.5–8.1)

## 2018-10-08 LAB — CBC
HCT: 28.3 % — ABNORMAL LOW (ref 36.0–46.0)
Hemoglobin: 8.5 g/dL — ABNORMAL LOW (ref 12.0–15.0)
MCH: 30.7 pg (ref 26.0–34.0)
MCHC: 30 g/dL (ref 30.0–36.0)
MCV: 102.2 fL — ABNORMAL HIGH (ref 80.0–100.0)
Platelets: 225 10*3/uL (ref 150–400)
RBC: 2.77 MIL/uL — AB (ref 3.87–5.11)
RDW: 14.6 % (ref 11.5–15.5)
WBC: 5.3 10*3/uL (ref 4.0–10.5)
nRBC: 0 % (ref 0.0–0.2)

## 2018-10-08 MED ORDER — ZOLPIDEM TARTRATE 10 MG PO TABS: 10.0000 mg | ORAL_TABLET | Freq: Every evening | ORAL | 0 refills | Status: DC | PRN

## 2018-10-08 MED ORDER — PROCHLORPERAZINE MALEATE 10 MG PO TABS: 10.0000 mg | ORAL_TABLET | Freq: Four times a day (QID) | ORAL | 1 refills | Status: DC | PRN

## 2018-10-08 MED ORDER — PROMETHAZINE HCL 25 MG PO TABS: 25.0000 mg | ORAL_TABLET | Freq: Four times a day (QID) | ORAL | 1 refills | Status: DC | PRN

## 2018-10-08 MED ORDER — PANTOPRAZOLE SODIUM 40 MG PO TBEC: 40.0000 mg | DELAYED_RELEASE_TABLET | Freq: Every day | ORAL | 1 refills | Status: DC

## 2018-10-10 ENCOUNTER — Telehealth (HOSPITAL_COMMUNITY): Payer: Self-pay | Admitting: *Deleted

## 2018-10-10 NOTE — Telephone Encounter (Signed)
Patient called to clarify that she is in fact not supposed to be taking the lisinopril.  I confirmed this per Dr. Tomie China last note, she is to stop taking the lisinopril as her blood pressure is controlled.  Patient verbalizes understanding.

## 2018-10-16 ENCOUNTER — Telehealth (HOSPITAL_COMMUNITY): Payer: Self-pay | Admitting: *Deleted

## 2018-10-16 ENCOUNTER — Other Ambulatory Visit (HOSPITAL_COMMUNITY): Payer: Self-pay | Admitting: *Deleted

## 2018-10-16 DIAGNOSIS — C9000 Multiple myeloma not having achieved remission: Secondary | ICD-10-CM

## 2018-10-16 DIAGNOSIS — F419 Anxiety disorder, unspecified: Secondary | ICD-10-CM

## 2018-10-16 MED ORDER — ALPRAZOLAM 0.25 MG PO TABS: 0.2500 mg | ORAL_TABLET | Freq: Two times a day (BID) | ORAL | 0 refills | Status: DC | PRN

## 2018-10-24 ENCOUNTER — Ambulatory Visit (HOSPITAL_COMMUNITY): Payer: Medicaid Other | Admitting: Psychiatry

## 2018-11-06 ENCOUNTER — Telehealth (HOSPITAL_COMMUNITY): Payer: Self-pay | Admitting: *Deleted

## 2018-11-06 ENCOUNTER — Other Ambulatory Visit (HOSPITAL_COMMUNITY): Payer: Self-pay | Admitting: *Deleted

## 2018-11-06 DIAGNOSIS — G479 Sleep disorder, unspecified: Secondary | ICD-10-CM

## 2018-11-06 DIAGNOSIS — C9000 Multiple myeloma not having achieved remission: Secondary | ICD-10-CM

## 2018-11-06 MED ORDER — ZOLPIDEM TARTRATE 10 MG PO TABS: 10.0000 mg | ORAL_TABLET | Freq: Every evening | ORAL | 0 refills | Status: DC | PRN

## 2018-11-06 MED ORDER — HYDROCODONE-ACETAMINOPHEN 5-325 MG PO TABS: 1.0000 | ORAL_TABLET | Freq: Three times a day (TID) | ORAL | 0 refills | Status: DC | PRN

## 2018-11-10 ENCOUNTER — Inpatient Hospital Stay (HOSPITAL_BASED_OUTPATIENT_CLINIC_OR_DEPARTMENT_OTHER): Payer: Medicaid Other | Admitting: Hematology

## 2018-11-10 ENCOUNTER — Inpatient Hospital Stay (HOSPITAL_COMMUNITY): Payer: Medicaid Other

## 2018-11-10 ENCOUNTER — Ambulatory Visit (HOSPITAL_COMMUNITY)
Admission: RE | Admit: 2018-11-10 | Discharge: 2018-11-10 | Disposition: A | Discharge status: Home / Self Care | Payer: Medicaid Other | Source: Ambulatory Visit | Attending: Hematology | Admitting: Hematology

## 2018-11-10 ENCOUNTER — Other Ambulatory Visit (HOSPITAL_COMMUNITY): Payer: Self-pay | Admitting: Nurse Practitioner

## 2018-11-10 ENCOUNTER — Inpatient Hospital Stay (HOSPITAL_COMMUNITY): Payer: Medicaid Other | Attending: Hematology

## 2018-11-10 ENCOUNTER — Telehealth (HOSPITAL_COMMUNITY): Payer: Self-pay | Admitting: Surgery

## 2018-11-10 ENCOUNTER — Encounter (HOSPITAL_COMMUNITY): Payer: Self-pay | Admitting: Hematology

## 2018-11-10 ENCOUNTER — Other Ambulatory Visit: Payer: Self-pay

## 2018-11-10 VITALS — BP 169/78 | HR 81 | Temp 98.2°F | Resp 18

## 2018-11-10 DIAGNOSIS — R51 Headache: Secondary | ICD-10-CM | POA: Diagnosis present

## 2018-11-10 DIAGNOSIS — C9000 Multiple myeloma not having achieved remission: Secondary | ICD-10-CM | POA: Diagnosis not present

## 2018-11-10 DIAGNOSIS — R1011 Right upper quadrant pain: Secondary | ICD-10-CM | POA: Diagnosis present

## 2018-11-10 DIAGNOSIS — G8929 Other chronic pain: Secondary | ICD-10-CM

## 2018-11-10 LAB — COMPREHENSIVE METABOLIC PANEL
ALT: 9 U/L (ref 0–44)
AST: 16 U/L (ref 15–41)
Albumin: 3.8 g/dL (ref 3.5–5.0)
Alkaline Phosphatase: 68 U/L (ref 38–126)
Anion gap: 10 (ref 5–15)
BUN: 13 mg/dL (ref 8–23)
CO2: 21 mmol/L — ABNORMAL LOW (ref 22–32)
Calcium: 7.8 mg/dL — ABNORMAL LOW (ref 8.9–10.3)
Chloride: 108 mmol/L (ref 98–111)
Creatinine, Ser: 1.28 mg/dL — ABNORMAL HIGH (ref 0.44–1.00)
GFR calc Af Amer: 49 mL/min — ABNORMAL LOW (ref >60)
GFR calc non Af Amer: 42 mL/min — ABNORMAL LOW (ref >60)
Glucose, Bld: 146 mg/dL — ABNORMAL HIGH (ref 70–99)
Potassium: 4.1 mmol/L (ref 3.5–5.1)
Sodium: 139 mmol/L (ref 135–145)
Total Bilirubin: 0.6 mg/dL (ref 0.3–1.2)
Total Protein: 6.5 g/dL (ref 6.5–8.1)

## 2018-11-10 LAB — CBC WITH DIFFERENTIAL/PLATELET
Abs Immature Granulocytes: 0.24 10*3/uL — ABNORMAL HIGH (ref 0.00–0.07)
Basophils Absolute: 0.1 10*3/uL (ref 0.0–0.1)
Basophils Relative: 2 %
Eosinophils Absolute: 0 10*3/uL (ref 0.0–0.5)
Eosinophils Relative: 0 %
HCT: 25.7 % — ABNORMAL LOW (ref 36.0–46.0)
Hemoglobin: 8.4 g/dL — ABNORMAL LOW (ref 12.0–15.0)
Immature Granulocytes: 6 %
Lymphocytes Relative: 18 %
Lymphs Abs: 0.7 10*3/uL (ref 0.7–4.0)
MCH: 31.9 pg (ref 26.0–34.0)
MCHC: 32.7 g/dL (ref 30.0–36.0)
MCV: 97.7 fL (ref 80.0–100.0)
MONO ABS: 0.8 10*3/uL (ref 0.1–1.0)
Monocytes Relative: 20 %
Neutro Abs: 2.1 10*3/uL (ref 1.7–7.7)
Neutrophils Relative %: 54 %
Platelets: 113 10*3/uL — ABNORMAL LOW (ref 150–400)
RBC: 2.63 MIL/uL — ABNORMAL LOW (ref 3.87–5.11)
RDW: 16.2 % — ABNORMAL HIGH (ref 11.5–15.5)
WBC: 3.8 10*3/uL — ABNORMAL LOW (ref 4.0–10.5)
nRBC: 0 % (ref 0.0–0.2)

## 2018-11-10 LAB — LIPASE, BLOOD: Lipase: 29 U/L (ref 11–51)

## 2018-11-10 LAB — AMYLASE: Amylase: 40 U/L (ref 28–100)

## 2018-11-10 MED ORDER — SODIUM CHLORIDE 0.9% FLUSH
10.0000 mL | Freq: Once | INTRAVENOUS | Status: AC
  Administered 2018-11-10: 10 mL

## 2018-11-10 MED ORDER — IOHEXOL 300 MG/ML  SOLN
75.0000 mL | Freq: Once | INTRAMUSCULAR | Status: AC | PRN
  Administered 2018-11-10: 75 mL via INTRAVENOUS

## 2018-11-10 MED ORDER — HEPARIN SOD (PORK) LOCK FLUSH 100 UNIT/ML IV SOLN
500.0000 [IU] | Freq: Once | INTRAVENOUS | Status: AC
  Administered 2018-11-10: 500 [IU] via INTRAVENOUS

## 2018-11-10 MED ORDER — SODIUM CHLORIDE 0.9 % IV SOLN
Freq: Once | INTRAVENOUS | Status: AC
  Administered 2018-11-10: 10:00:00 via INTRAVENOUS

## 2018-11-10 NOTE — Patient Instructions (Signed)
[facility] at [facility]  Discharge Instructions:   _______________________________________________________________  Thank you for choosing [facility] at [facility] to provide your oncology and hematology care.  To afford each patient quality time with our providers, please arrive at least 15 minutes before your scheduled appointment.  You need to re-schedule your appointment if you arrive 10 or more minutes late.  We strive to give you quality time with our providers, and arriving late affects you and other patients whose appointments are after yours.  Also, if you no show three or more times for appointments you may be dismissed from the clinic.  Again, thank you for choosing [facility] at [facility]. Our hope is that these requests will allow you access to exceptional care and in a timely manner. _______________________________________________________________  If you have questions after your visit, please contact our office at [phone] between the hours of 8:30 a.m. and 5:00 p.m. Voicemails left after 4:30 p.m. will not be returned until the following business day. _______________________________________________________________  For prescription refill requests, have your pharmacy contact our office. _______________________________________________________________  Recommendations made by the consultant and any test results will be sent to your referring physician. _______________________________________________________________ 

## 2018-11-10 NOTE — Telephone Encounter (Signed)
Per [name], called Alexandra Price and let her know that her CT results were good, and that the Alexandra Price can take hydrocodone as needed for pain.  Alexandra Price verbalized understanding and was told to call our office if she had any concerns.

## 2018-11-10 NOTE — Progress Notes (Unsigned)
Pt presents today with abdominal pain. Pt placed on the schedule to see Dr. Delton Coombes and receive IV fluids for hydration. VSS. VO received to bolus 576mls of NS over 30 minutes. Pt afebrile. Post bone marrow transplant.    IV fluids given today per MD orders. Tolerated infusion without adverse affects. Vital signs stable. No complaints at this time. Discharged from clinic ambulatory for STAT CT of abdomen. Labs reviewed by MD and NP. Pt post bone marrow transplant.   F/U with [facility] as scheduled.

## 2018-11-10 NOTE — Patient Instructions (Addendum)
[place] at [facility] Discharge Instructions  You were seen today by Dr. Delton Coombes. He went over your recent lab results. Keep scheduled appointments   Thank you for choosing [place] at [facility] to provide your oncology and hematology care.  To afford each patient quality time with our provider, please arrive at least 15 minutes before your scheduled appointment time.   If you have a lab appointment with the [facility] please come in thru the  Main Entrance and check in at the main information desk  You need to re-schedule your appointment should you arrive 10 or more minutes late.  We strive to give you quality time with our providers, and arriving late affects you and other patients whose appointments are after yours.  Also, if you no show three or more times for appointments you may be dismissed from the clinic at the providers discretion.     Again, thank you for choosing [facility].  Our hope is that these requests will decrease the amount of time that you wait before being seen by our physicians.       _____________________________________________________________  Should you have questions after your visit to [facility], please contact our office at [phone] between the hours of 8:00 a.m. and 4:30 p.m.  Voicemails left after 4:00 p.m. will not be returned until the following business day.  For prescription refill requests, have your pharmacy contact our office and allow 72 hours.    Cancer Center Support Programs:   > Cancer Support Group  2nd Tuesday of the month 1pm-2pm, Journey Room

## 2018-11-10 NOTE — Assessment & Plan Note (Addendum)
.  1.  IgG lambda plasma cell myeloma, stage II by R-ISS: - Presentation to [facility] with left-sided rib pain on 12/29/2017, found to have hypercalcemia, anemia and acute kidney injury, further work-up showed 3.2 g/dL of IgG lambda monoclonal gammopathy. -Skeletal survey reportedly demonstrated diffuse osteopenia and multiple lytic lesions in bilateral lower extremities. -Bone marrow biopsy on 12/31/2017 showed hypercellular marrow with 40 to 50% cellularity, involved by plasma cell neoplasm (17% plasma cells by manual aspirate differential count, greater than 90% and 30 to 40% by CD138 immunohistochemical analysis of clot and core respectively, and monotypic lambda by flow cytometry analysis) - Chromosome analysis showed 12 of 20 cells examined are normal, 46, X6.  The remaining 8 metaphase cells analyzed are abnormal and represent a hyperdiploid clone containing 1 extra copy each of chromosome 3, 5, 6, 7, 9, 11, 15, 19, 20, 21, 22 as well as a marker chromosome. -Multiple myeloma FISH panel shows nuclei positive for three 1q signals, trisomy 11, and extra signals of chromosome 7, 9 and 15. - Patient was recommended to start chemotherapy with port placement by Dr. Federico Flake at [facility].  She was recommended to have IV chemotherapy as she only has Medicare part A. Patient did not follow-up at [facility] -On 04/07/2018, M spike was 4 g of IgG lambda.  LDH was 115.  Beta-2 microglobulin-12.8.  Light chain ratio is 0, Lambda light chains were elevated at 59,667. -Bone survey showed large expansile lytic lesion in the left shoulder, multiple lytic lesions in all bones. -4 cycles of KCyd from 04/17/2018 through 08/21/2018. - Autologous stem cell transplant on 10/23/2018.  Today is day +18. - She complains of right upper quadrant and epigastric pain which started Friday.  No clear association with foods.  She is not able to eat much except some green beans last night.  Had dry heaves.  No diarrhea reported. -I have  reviewed her blood work today.  Amylase lipase were within normal limits.  LFTs were normal. -I have ordered and reviewed a CT of the abdomen and pelvis in our office today.  Did not show any acute pathology in the abdomen.  There is small bilateral effusions, right more than left with some atelectasis. -She will have repeat labs on 11/13/2018.  She will come back on 12/22/2018 for follow-up visit.  2.  ID prophylaxis: - She will continue acyclovir.
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
## 2018-11-12 ENCOUNTER — Other Ambulatory Visit (HOSPITAL_COMMUNITY): Payer: Self-pay | Admitting: *Deleted

## 2018-11-12 ENCOUNTER — Telehealth (HOSPITAL_COMMUNITY): Payer: Self-pay | Admitting: *Deleted

## 2018-11-12 DIAGNOSIS — F419 Anxiety disorder, unspecified: Secondary | ICD-10-CM

## 2018-11-12 DIAGNOSIS — C9000 Multiple myeloma not having achieved remission: Secondary | ICD-10-CM

## 2018-11-12 MED ORDER — LANREOTIDE ACETATE 120 MG/0.5ML ~~LOC~~ SOLN
SUBCUTANEOUS | Status: AC
  Filled 2018-11-12: qty 120

## 2018-11-12 MED ORDER — ALPRAZOLAM 0.25 MG PO TABS: 0.2500 mg | ORAL_TABLET | Freq: Two times a day (BID) | ORAL | 0 refills | Status: DC | PRN

## 2018-11-13 ENCOUNTER — Other Ambulatory Visit (HOSPITAL_COMMUNITY): Payer: Self-pay

## 2018-11-17 ENCOUNTER — Other Ambulatory Visit (HOSPITAL_COMMUNITY): Payer: Self-pay | Admitting: Nurse Practitioner

## 2018-11-17 ENCOUNTER — Telehealth (HOSPITAL_COMMUNITY): Payer: Self-pay | Admitting: *Deleted

## 2018-11-17 NOTE — Telephone Encounter (Signed)
Received phone call from pt stating she had onset of left lower back pain around midnight this morning.  States it awoke her from sleeping.  States the pain is dull, aching.  Report she took a "pain pill" (hydrocodone) around 3 am with no relief.  Discussed with R. Lockamy, NP.  CT scan of abd/pelvis on 11/10/18 shows no abnormalities. Advised per NP to instruct pt to continue her pain medication as prescribed, and to come for lab work as scheduled on 11/19/2018, and we will notify her of any abnormalities on blood work.  Pt notified of above and verbalizes understanding.  Pt also states she contacted a nurse at [facility] to let them know of her pain, and the person she spoke with stated they would be in touch with [place] at [facility].  Informed pt we would be in touch with her once we hear from [facility] if they advise anything different.

## 2018-11-18 ENCOUNTER — Other Ambulatory Visit (HOSPITAL_COMMUNITY): Payer: Self-pay | Admitting: *Deleted

## 2018-11-18 DIAGNOSIS — C9000 Multiple myeloma not having achieved remission: Secondary | ICD-10-CM

## 2018-11-18 DIAGNOSIS — R42 Dizziness and giddiness: Secondary | ICD-10-CM

## 2018-11-18 MED ORDER — PANTOPRAZOLE SODIUM 40 MG PO TBEC: 40.0000 mg | DELAYED_RELEASE_TABLET | Freq: Every day | ORAL | 2 refills | Status: DC

## 2018-11-19 ENCOUNTER — Inpatient Hospital Stay (HOSPITAL_COMMUNITY): Payer: Medicaid Other | Attending: Nurse Practitioner

## 2018-11-19 ENCOUNTER — Other Ambulatory Visit: Payer: Self-pay

## 2018-11-19 ENCOUNTER — Inpatient Hospital Stay (HOSPITAL_BASED_OUTPATIENT_CLINIC_OR_DEPARTMENT_OTHER): Payer: Medicaid Other | Admitting: Nurse Practitioner

## 2018-11-19 ENCOUNTER — Other Ambulatory Visit (HOSPITAL_COMMUNITY): Payer: Self-pay | Admitting: *Deleted

## 2018-11-19 VITALS — BP 148/83 | HR 102 | Temp 98.9°F | Resp 18 | Wt 149.0 lb

## 2018-11-19 DIAGNOSIS — R109 Unspecified abdominal pain: Secondary | ICD-10-CM

## 2018-11-19 DIAGNOSIS — M25551 Pain in right hip: Secondary | ICD-10-CM | POA: Diagnosis not present

## 2018-11-19 DIAGNOSIS — C9000 Multiple myeloma not having achieved remission: Secondary | ICD-10-CM | POA: Diagnosis not present

## 2018-11-19 DIAGNOSIS — M545 Low back pain: Secondary | ICD-10-CM

## 2018-11-19 DIAGNOSIS — M25512 Pain in left shoulder: Secondary | ICD-10-CM | POA: Insufficient documentation

## 2018-11-19 DIAGNOSIS — M25561 Pain in right knee: Secondary | ICD-10-CM | POA: Insufficient documentation

## 2018-11-19 LAB — CBC WITH DIFFERENTIAL/PLATELET
Abs Immature Granulocytes: 0.01 10*3/uL (ref 0.00–0.07)
Basophils Absolute: 0.1 10*3/uL (ref 0.0–0.1)
Basophils Relative: 2 %
Eosinophils Absolute: 0.5 10*3/uL (ref 0.0–0.5)
Eosinophils Relative: 13 %
HCT: 30.4 % — ABNORMAL LOW (ref 36.0–46.0)
Hemoglobin: 9.7 g/dL — ABNORMAL LOW (ref 12.0–15.0)
Immature Granulocytes: 0 %
Lymphocytes Relative: 17 %
Lymphs Abs: 0.6 10*3/uL — ABNORMAL LOW (ref 0.7–4.0)
MCH: 31.8 pg (ref 26.0–34.0)
MCHC: 31.9 g/dL (ref 30.0–36.0)
MCV: 99.7 fL (ref 80.0–100.0)
Monocytes Absolute: 0.5 10*3/uL (ref 0.1–1.0)
Monocytes Relative: 13 %
Neutro Abs: 2 10*3/uL (ref 1.7–7.7)
Neutrophils Relative %: 55 %
Platelets: 201 10*3/uL (ref 150–400)
RBC: 3.05 MIL/uL — ABNORMAL LOW (ref 3.87–5.11)
RDW: 16 % — ABNORMAL HIGH (ref 11.5–15.5)
WBC: 3.7 10*3/uL — ABNORMAL LOW (ref 4.0–10.5)
nRBC: 0 % (ref 0.0–0.2)

## 2018-11-19 LAB — COMPREHENSIVE METABOLIC PANEL
ALT: 9 U/L (ref 0–44)
AST: 15 U/L (ref 15–41)
Albumin: 4.4 g/dL (ref 3.5–5.0)
Alkaline Phosphatase: 57 U/L (ref 38–126)
Anion gap: 11 (ref 5–15)
BUN: 19 mg/dL (ref 8–23)
CO2: 27 mmol/L (ref 22–32)
Calcium: 9.4 mg/dL (ref 8.9–10.3)
Chloride: 102 mmol/L (ref 98–111)
Creatinine, Ser: 1.5 mg/dL — ABNORMAL HIGH (ref 0.44–1.00)
GFR calc Af Amer: 40 mL/min — ABNORMAL LOW (ref >60)
GFR calc non Af Amer: 35 mL/min — ABNORMAL LOW (ref >60)
Glucose, Bld: 140 mg/dL — ABNORMAL HIGH (ref 70–99)
Potassium: 4.5 mmol/L (ref 3.5–5.1)
Sodium: 140 mmol/L (ref 135–145)
Total Bilirubin: 0.5 mg/dL (ref 0.3–1.2)
Total Protein: 6.8 g/dL (ref 6.5–8.1)

## 2018-11-19 LAB — URINALYSIS, ROUTINE W REFLEX MICROSCOPIC
Bilirubin Urine: NEGATIVE
Glucose, UA: NEGATIVE mg/dL
Hgb urine dipstick: NEGATIVE
Ketones, ur: 5 mg/dL — AB
Leukocytes,Ua: NEGATIVE
Nitrite: NEGATIVE
Protein, ur: NEGATIVE mg/dL
Specific Gravity, Urine: 1.014 (ref 1.005–1.030)
pH: 5 (ref 5.0–8.0)

## 2018-11-19 LAB — LACTATE DEHYDROGENASE: LDH: 144 U/L (ref 98–192)

## 2018-11-19 LAB — TSH: TSH: 1.114 u[IU]/mL (ref 0.350–4.500)

## 2018-11-19 NOTE — Assessment & Plan Note (Signed)
1.  Multiple myeloma: - Post bone marrow transplant that she had 10/23/2018. - Labs done 11/19/2018 where WBC 3.7, hemoglobin 9.7, platelets 201, creatinine 1.50, and calcium 9.4. - She has a follow-up appointment with Dr. Delton Coombes in May 100 days post transplant.  2.  Left lower back pain: - She has been experiencing left lower back pain for 4 days now she reports she can feel it all the way through her left side. - We ordered a UA which was within normal limits. - She reports she is not strained or injured herself. - The pain is not reproducible by palpation.  The pain is not worse with movement.  The pain is constant. - She reports her pain medication does help it.

## 2018-11-19 NOTE — Patient Instructions (Signed)
[place] at [facility] Discharge Instructions  Follow up with her Delton Coombes in May with labs   Thank you for choosing [place] at [facility] to provide your oncology and hematology care.  To afford each patient quality time with our provider, please arrive at least 15 minutes before your scheduled appointment time.   If you have a lab appointment with the [facility] please come in thru the  Main Entrance and check in at the main information desk  You need to re-schedule your appointment should you arrive 10 or more minutes late.  We strive to give you quality time with our providers, and arriving late affects you and other patients whose appointments are after yours.  Also, if you no show three or more times for appointments you may be dismissed from the clinic at the providers discretion.     Again, thank you for choosing [facility].  Our hope is that these requests will decrease the amount of time that you wait before being seen by our physicians.       _____________________________________________________________  Should you have questions after your visit to [facility], please contact our office at [phone] between the hours of 8:00 a.m. and 4:30 p.m.  Voicemails left after 4:00 p.m. will not be returned until the following business day.  For prescription refill requests, have your pharmacy contact our office and allow 72 hours.    Cancer Center Support Programs:   > Cancer Support Group  2nd Tuesday of the month 1pm-2pm, Journey Room
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
## 2018-11-20 LAB — IGG, IGA, IGM
IgA: 99 mg/dL (ref 87–352)
IgG (Immunoglobin G), Serum: 676 mg/dL (ref 586–1602)
IgM (Immunoglobulin M), Srm: 14 mg/dL — ABNORMAL LOW (ref 26–217)

## 2018-11-20 LAB — PROTEIN ELECTROPHORESIS, SERUM
A/G Ratio: 1.4 (ref 0.7–1.7)
Albumin ELP: 3.7 g/dL (ref 2.9–4.4)
Alpha-1-Globulin: 0.3 g/dL (ref 0.0–0.4)
Alpha-2-Globulin: 0.8 g/dL (ref 0.4–1.0)
Beta Globulin: 1.1 g/dL (ref 0.7–1.3)
Gamma Globulin: 0.4 g/dL (ref 0.4–1.8)
Globulin, Total: 2.7 g/dL (ref 2.2–3.9)
Total Protein ELP: 6.4 g/dL (ref 6.0–8.5)

## 2018-11-20 LAB — IMMUNOFIXATION ELECTROPHORESIS
IgA: 96 mg/dL (ref 87–352)
IgG (Immunoglobin G), Serum: 659 mg/dL (ref 586–1602)
IgM (Immunoglobulin M), Srm: 14 mg/dL — ABNORMAL LOW (ref 26–217)
Total Protein ELP: 6.2 g/dL (ref 6.0–8.5)

## 2018-11-20 LAB — KAPPA/LAMBDA LIGHT CHAINS
Kappa free light chain: 42.5 mg/L — ABNORMAL HIGH (ref 3.3–19.4)
Kappa, lambda light chain ratio: 4.05 — ABNORMAL HIGH (ref 0.26–1.65)
Lambda free light chains: 10.5 mg/L (ref 5.7–26.3)

## 2018-11-26 ENCOUNTER — Other Ambulatory Visit (HOSPITAL_COMMUNITY): Payer: Self-pay | Admitting: Nurse Practitioner

## 2018-11-26 ENCOUNTER — Other Ambulatory Visit (HOSPITAL_COMMUNITY): Payer: Self-pay | Admitting: *Deleted

## 2018-11-26 ENCOUNTER — Telehealth (HOSPITAL_COMMUNITY): Payer: Self-pay | Admitting: *Deleted

## 2018-11-26 DIAGNOSIS — C9000 Multiple myeloma not having achieved remission: Secondary | ICD-10-CM

## 2018-11-26 MED ORDER — HYDROCODONE-ACETAMINOPHEN 5-325 MG PO TABS: 1.0000 | ORAL_TABLET | Freq: Four times a day (QID) | ORAL | 0 refills | Status: DC | PRN

## 2018-11-26 MED ORDER — GLIPIZIDE ER 5 MG PO TB24: 5.0000 mg | ORAL_TABLET | Freq: Every day | ORAL | 1 refills | Status: DC

## 2018-11-26 NOTE — Progress Notes (Signed)
Refill for glipizide sent to [facility], [place].

## 2018-11-26 NOTE — Telephone Encounter (Signed)
Glipizide and hydrocodone with new frequency (every 6 hours) sent to [facility], [place].  Pt called to notify.

## 2018-11-27 ENCOUNTER — Other Ambulatory Visit (HOSPITAL_COMMUNITY): Payer: Self-pay | Admitting: *Deleted

## 2018-11-27 DIAGNOSIS — C9000 Multiple myeloma not having achieved remission: Secondary | ICD-10-CM

## 2018-11-27 NOTE — Progress Notes (Signed)
Orders received from [facility] for labs to be drawn weekly. Orders are in and appointments have been made.

## 2018-12-01 ENCOUNTER — Other Ambulatory Visit: Payer: Self-pay

## 2018-12-01 ENCOUNTER — Inpatient Hospital Stay (HOSPITAL_COMMUNITY): Payer: Medicaid Other

## 2018-12-01 DIAGNOSIS — C9000 Multiple myeloma not having achieved remission: Secondary | ICD-10-CM

## 2018-12-01 LAB — CBC WITH DIFFERENTIAL/PLATELET
Abs Immature Granulocytes: 0.02 10*3/uL (ref 0.00–0.07)
Basophils Absolute: 0.1 10*3/uL (ref 0.0–0.1)
Basophils Relative: 1 %
Eosinophils Absolute: 0.4 10*3/uL (ref 0.0–0.5)
Eosinophils Relative: 8 %
HCT: 30.5 % — ABNORMAL LOW (ref 36.0–46.0)
Hemoglobin: 9.5 g/dL — ABNORMAL LOW (ref 12.0–15.0)
Immature Granulocytes: 0 %
Lymphocytes Relative: 16 %
Lymphs Abs: 0.9 10*3/uL (ref 0.7–4.0)
MCH: 31.8 pg (ref 26.0–34.0)
MCHC: 31.1 g/dL (ref 30.0–36.0)
MCV: 102 fL — ABNORMAL HIGH (ref 80.0–100.0)
Monocytes Absolute: 0.6 10*3/uL (ref 0.1–1.0)
Monocytes Relative: 10 %
Neutro Abs: 3.6 10*3/uL (ref 1.7–7.7)
Neutrophils Relative %: 65 %
Platelets: 177 10*3/uL (ref 150–400)
RBC: 2.99 MIL/uL — ABNORMAL LOW (ref 3.87–5.11)
RDW: 14.5 % (ref 11.5–15.5)
WBC: 5.5 10*3/uL (ref 4.0–10.5)
nRBC: 0 % (ref 0.0–0.2)

## 2018-12-01 LAB — COMPREHENSIVE METABOLIC PANEL
ALT: 11 U/L (ref 0–44)
AST: 16 U/L (ref 15–41)
Albumin: 4.1 g/dL (ref 3.5–5.0)
Alkaline Phosphatase: 58 U/L (ref 38–126)
Anion gap: 7 (ref 5–15)
BUN: 24 mg/dL — ABNORMAL HIGH (ref 8–23)
CO2: 27 mmol/L (ref 22–32)
Calcium: 8.8 mg/dL — ABNORMAL LOW (ref 8.9–10.3)
Chloride: 105 mmol/L (ref 98–111)
Creatinine, Ser: 1.54 mg/dL — ABNORMAL HIGH (ref 0.44–1.00)
GFR calc Af Amer: 39 mL/min — ABNORMAL LOW (ref >60)
GFR calc non Af Amer: 34 mL/min — ABNORMAL LOW (ref >60)
Glucose, Bld: 124 mg/dL — ABNORMAL HIGH (ref 70–99)
Potassium: 4.8 mmol/L (ref 3.5–5.1)
Sodium: 139 mmol/L (ref 135–145)
Total Bilirubin: 0.8 mg/dL (ref 0.3–1.2)
Total Protein: 6.4 g/dL — ABNORMAL LOW (ref 6.5–8.1)

## 2018-12-01 LAB — MAGNESIUM: Magnesium: 2 mg/dL (ref 1.7–2.4)

## 2018-12-02 ENCOUNTER — Other Ambulatory Visit (HOSPITAL_COMMUNITY): Payer: Self-pay | Admitting: *Deleted

## 2018-12-02 ENCOUNTER — Encounter (HOSPITAL_COMMUNITY): Payer: Self-pay | Admitting: *Deleted

## 2018-12-02 DIAGNOSIS — R350 Frequency of micturition: Secondary | ICD-10-CM

## 2018-12-02 MED ORDER — DENOSUMAB 120 MG/1.7ML ~~LOC~~ SOLN
SUBCUTANEOUS | Status: AC
  Filled 2018-12-02: qty 1.7

## 2018-12-02 NOTE — Progress Notes (Signed)
Patient called clinic c/o urinary frequency and urgency but then only dribbles. She states that it burns at times to urinate.  I have made an appointment for her to come to lab for urinalysis.  We will call her with the results.

## 2018-12-03 ENCOUNTER — Other Ambulatory Visit: Payer: Self-pay

## 2018-12-03 ENCOUNTER — Inpatient Hospital Stay (HOSPITAL_COMMUNITY): Payer: Medicaid Other

## 2018-12-03 DIAGNOSIS — C9000 Multiple myeloma not having achieved remission: Secondary | ICD-10-CM | POA: Diagnosis not present

## 2018-12-03 DIAGNOSIS — R350 Frequency of micturition: Secondary | ICD-10-CM

## 2018-12-03 LAB — URINALYSIS, ROUTINE W REFLEX MICROSCOPIC
Bilirubin Urine: NEGATIVE
Glucose, UA: NEGATIVE mg/dL
Hgb urine dipstick: NEGATIVE
Ketones, ur: NEGATIVE mg/dL
Leukocytes,Ua: NEGATIVE
Nitrite: NEGATIVE
Protein, ur: NEGATIVE mg/dL
Specific Gravity, Urine: 1.013 (ref 1.005–1.030)
pH: 6 (ref 5.0–8.0)

## 2018-12-05 ENCOUNTER — Other Ambulatory Visit (HOSPITAL_COMMUNITY): Payer: Self-pay | Admitting: *Deleted

## 2018-12-05 DIAGNOSIS — G479 Sleep disorder, unspecified: Secondary | ICD-10-CM

## 2018-12-06 ENCOUNTER — Other Ambulatory Visit (HOSPITAL_COMMUNITY): Payer: Self-pay | Admitting: Nurse Practitioner

## 2018-12-06 DIAGNOSIS — G479 Sleep disorder, unspecified: Secondary | ICD-10-CM

## 2018-12-08 ENCOUNTER — Ambulatory Visit (HOSPITAL_COMMUNITY)
Admission: RE | Admit: 2018-12-08 | Discharge: 2018-12-08 | Disposition: A | Discharge status: Home / Self Care | Payer: Medicaid Other | Source: Ambulatory Visit | Attending: Nurse Practitioner | Admitting: Nurse Practitioner

## 2018-12-08 ENCOUNTER — Other Ambulatory Visit: Payer: Self-pay

## 2018-12-08 ENCOUNTER — Encounter (HOSPITAL_COMMUNITY): Payer: Self-pay | Admitting: Nurse Practitioner

## 2018-12-08 ENCOUNTER — Inpatient Hospital Stay (HOSPITAL_BASED_OUTPATIENT_CLINIC_OR_DEPARTMENT_OTHER): Payer: Medicaid Other | Admitting: Nurse Practitioner

## 2018-12-08 ENCOUNTER — Other Ambulatory Visit (HOSPITAL_COMMUNITY): Payer: Self-pay | Admitting: *Deleted

## 2018-12-08 ENCOUNTER — Telehealth (HOSPITAL_COMMUNITY): Payer: Self-pay | Admitting: *Deleted

## 2018-12-08 ENCOUNTER — Other Ambulatory Visit (HOSPITAL_COMMUNITY): Payer: Self-pay

## 2018-12-08 ENCOUNTER — Inpatient Hospital Stay (HOSPITAL_COMMUNITY): Payer: Medicaid Other

## 2018-12-08 VITALS — BP 156/80 | HR 80 | Resp 18 | Wt 151.0 lb

## 2018-12-08 DIAGNOSIS — M25551 Pain in right hip: Secondary | ICD-10-CM

## 2018-12-08 DIAGNOSIS — M25512 Pain in left shoulder: Secondary | ICD-10-CM

## 2018-12-08 DIAGNOSIS — C9000 Multiple myeloma not having achieved remission: Secondary | ICD-10-CM

## 2018-12-08 DIAGNOSIS — G479 Sleep disorder, unspecified: Secondary | ICD-10-CM

## 2018-12-08 DIAGNOSIS — M25561 Pain in right knee: Secondary | ICD-10-CM

## 2018-12-08 LAB — CBC WITH DIFFERENTIAL/PLATELET
Abs Immature Granulocytes: 0.01 10*3/uL (ref 0.00–0.07)
Basophils Absolute: 0.1 10*3/uL (ref 0.0–0.1)
Basophils Relative: 1 %
Eosinophils Absolute: 0.4 10*3/uL (ref 0.0–0.5)
Eosinophils Relative: 9 %
HCT: 31.4 % — ABNORMAL LOW (ref 36.0–46.0)
Hemoglobin: 10 g/dL — ABNORMAL LOW (ref 12.0–15.0)
Immature Granulocytes: 0 %
Lymphocytes Relative: 18 %
Lymphs Abs: 0.9 10*3/uL (ref 0.7–4.0)
MCH: 32.5 pg (ref 26.0–34.0)
MCHC: 31.8 g/dL (ref 30.0–36.0)
MCV: 101.9 fL — ABNORMAL HIGH (ref 80.0–100.0)
Monocytes Absolute: 0.5 10*3/uL (ref 0.1–1.0)
Monocytes Relative: 10 %
Neutro Abs: 3.1 10*3/uL (ref 1.7–7.7)
Neutrophils Relative %: 62 %
Platelets: 173 10*3/uL (ref 150–400)
RBC: 3.08 MIL/uL — ABNORMAL LOW (ref 3.87–5.11)
RDW: 14.3 % (ref 11.5–15.5)
WBC: 4.9 10*3/uL (ref 4.0–10.5)
nRBC: 0 % (ref 0.0–0.2)

## 2018-12-08 LAB — SAMPLE TO BLOOD BANK

## 2018-12-08 LAB — COMPREHENSIVE METABOLIC PANEL
ALT: 11 U/L (ref 0–44)
AST: 16 U/L (ref 15–41)
Albumin: 4.3 g/dL (ref 3.5–5.0)
Alkaline Phosphatase: 50 U/L (ref 38–126)
Anion gap: 8 (ref 5–15)
BUN: 22 mg/dL (ref 8–23)
CO2: 27 mmol/L (ref 22–32)
Calcium: 9.2 mg/dL (ref 8.9–10.3)
Chloride: 104 mmol/L (ref 98–111)
Creatinine, Ser: 1.58 mg/dL — ABNORMAL HIGH (ref 0.44–1.00)
GFR calc Af Amer: 38 mL/min — ABNORMAL LOW (ref >60)
GFR calc non Af Amer: 33 mL/min — ABNORMAL LOW (ref >60)
Glucose, Bld: 127 mg/dL — ABNORMAL HIGH (ref 70–99)
Potassium: 5 mmol/L (ref 3.5–5.1)
Sodium: 139 mmol/L (ref 135–145)
Total Bilirubin: 0.1 mg/dL — ABNORMAL LOW (ref 0.3–1.2)
Total Protein: 6.9 g/dL (ref 6.5–8.1)

## 2018-12-08 LAB — MAGNESIUM: Magnesium: 2 mg/dL (ref 1.7–2.4)

## 2018-12-08 MED ORDER — DIPHENHYDRAMINE HCL 50 MG/ML IJ SOLN
INTRAMUSCULAR | Status: AC
  Filled 2018-12-08: qty 1

## 2018-12-08 MED ORDER — ZOLPIDEM TARTRATE 10 MG PO TABS: 10.0000 mg | ORAL_TABLET | Freq: Every evening | ORAL | 0 refills | Status: DC | PRN
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
## 2018-12-10 ENCOUNTER — Other Ambulatory Visit (HOSPITAL_COMMUNITY): Payer: Self-pay | Admitting: *Deleted

## 2018-12-10 DIAGNOSIS — C9000 Multiple myeloma not having achieved remission: Secondary | ICD-10-CM

## 2018-12-10 NOTE — Progress Notes (Signed)
Orders received from [facility] for G6PD to be drawn next Monday with patient's labs.  Orders are in.

## 2018-12-11 ENCOUNTER — Other Ambulatory Visit (HOSPITAL_COMMUNITY): Payer: Self-pay | Admitting: *Deleted

## 2018-12-11 ENCOUNTER — Other Ambulatory Visit (HOSPITAL_COMMUNITY): Payer: Self-pay | Admitting: Nurse Practitioner

## 2018-12-11 DIAGNOSIS — F419 Anxiety disorder, unspecified: Secondary | ICD-10-CM

## 2018-12-11 DIAGNOSIS — C9000 Multiple myeloma not having achieved remission: Secondary | ICD-10-CM

## 2018-12-11 MED ORDER — ALPRAZOLAM 0.25 MG PO TABS: 0.2500 mg | ORAL_TABLET | Freq: Two times a day (BID) | ORAL | 0 refills | Status: DC | PRN

## 2018-12-15 ENCOUNTER — Other Ambulatory Visit: Payer: Self-pay

## 2018-12-15 ENCOUNTER — Inpatient Hospital Stay (HOSPITAL_COMMUNITY): Payer: Medicaid Other

## 2018-12-15 DIAGNOSIS — C9000 Multiple myeloma not having achieved remission: Secondary | ICD-10-CM | POA: Diagnosis not present

## 2018-12-15 LAB — COMPREHENSIVE METABOLIC PANEL
ALT: 11 U/L (ref 0–44)
AST: 15 U/L (ref 15–41)
Albumin: 4 g/dL (ref 3.5–5.0)
Alkaline Phosphatase: 51 U/L (ref 38–126)
Anion gap: 8 (ref 5–15)
BUN: 32 mg/dL — ABNORMAL HIGH (ref 8–23)
CO2: 25 mmol/L (ref 22–32)
Calcium: 8.4 mg/dL — ABNORMAL LOW (ref 8.9–10.3)
Chloride: 105 mmol/L (ref 98–111)
Creatinine, Ser: 1.59 mg/dL — ABNORMAL HIGH (ref 0.44–1.00)
GFR calc Af Amer: 38 mL/min — ABNORMAL LOW (ref >60)
GFR calc non Af Amer: 33 mL/min — ABNORMAL LOW (ref >60)
Glucose, Bld: 125 mg/dL — ABNORMAL HIGH (ref 70–99)
Potassium: 4.7 mmol/L (ref 3.5–5.1)
Sodium: 138 mmol/L (ref 135–145)
Total Bilirubin: 0.5 mg/dL (ref 0.3–1.2)
Total Protein: 6.3 g/dL — ABNORMAL LOW (ref 6.5–8.1)

## 2018-12-15 LAB — CBC WITH DIFFERENTIAL/PLATELET
Abs Immature Granulocytes: 0.01 10*3/uL (ref 0.00–0.07)
Basophils Absolute: 0 10*3/uL (ref 0.0–0.1)
Basophils Relative: 1 %
Eosinophils Absolute: 0.3 10*3/uL (ref 0.0–0.5)
Eosinophils Relative: 10 %
HCT: 29.8 % — ABNORMAL LOW (ref 36.0–46.0)
Hemoglobin: 9.4 g/dL — ABNORMAL LOW (ref 12.0–15.0)
Immature Granulocytes: 0 %
Lymphocytes Relative: 19 %
Lymphs Abs: 0.7 10*3/uL (ref 0.7–4.0)
MCH: 32.4 pg (ref 26.0–34.0)
MCHC: 31.5 g/dL (ref 30.0–36.0)
MCV: 102.8 fL — ABNORMAL HIGH (ref 80.0–100.0)
Monocytes Absolute: 0.4 10*3/uL (ref 0.1–1.0)
Monocytes Relative: 13 %
Neutro Abs: 2 10*3/uL (ref 1.7–7.7)
Neutrophils Relative %: 57 %
Platelets: 154 10*3/uL (ref 150–400)
RBC: 2.9 MIL/uL — ABNORMAL LOW (ref 3.87–5.11)
RDW: 14.2 % (ref 11.5–15.5)
WBC: 3.5 10*3/uL — ABNORMAL LOW (ref 4.0–10.5)
nRBC: 0 % (ref 0.0–0.2)

## 2018-12-15 LAB — SAMPLE TO BLOOD BANK

## 2018-12-15 LAB — MAGNESIUM: Magnesium: 2.1 mg/dL (ref 1.7–2.4)

## 2018-12-16 ENCOUNTER — Telehealth (HOSPITAL_COMMUNITY): Payer: Self-pay | Admitting: *Deleted

## 2018-12-16 LAB — GLUCOSE 6 PHOSPHATE DEHYDROGENASE
G6PDH: 9.3 U/g{Hb} (ref 4.6–13.5)
Hemoglobin: 9.4 g/dL — ABNORMAL LOW (ref 11.1–15.9)

## 2018-12-16 MED ORDER — FULVESTRANT 250 MG/5ML IM SOLN
INTRAMUSCULAR | Status: AC
  Filled 2018-12-16: qty 5

## 2018-12-16 NOTE — Telephone Encounter (Signed)
Called to pt let her know her PCP has to refill glipizide, faxed [place] the refill form. Per pt [facility] has discontinued her prinvil medication.

## 2018-12-18 ENCOUNTER — Ambulatory Visit (HOSPITAL_COMMUNITY): Payer: Self-pay | Admitting: Hematology

## 2018-12-18 ENCOUNTER — Other Ambulatory Visit (HOSPITAL_COMMUNITY): Payer: Self-pay

## 2018-12-22 ENCOUNTER — Encounter (HOSPITAL_COMMUNITY): Payer: Self-pay | Admitting: *Deleted

## 2018-12-22 ENCOUNTER — Other Ambulatory Visit: Payer: Self-pay

## 2018-12-22 ENCOUNTER — Encounter (HOSPITAL_COMMUNITY): Payer: Self-pay | Admitting: Hematology

## 2018-12-22 ENCOUNTER — Inpatient Hospital Stay (HOSPITAL_BASED_OUTPATIENT_CLINIC_OR_DEPARTMENT_OTHER): Payer: Medicaid Other | Admitting: Hematology

## 2018-12-22 ENCOUNTER — Inpatient Hospital Stay (HOSPITAL_COMMUNITY): Payer: Medicaid Other | Attending: Nurse Practitioner

## 2018-12-22 DIAGNOSIS — F329 Major depressive disorder, single episode, unspecified: Secondary | ICD-10-CM | POA: Insufficient documentation

## 2018-12-22 DIAGNOSIS — M549 Dorsalgia, unspecified: Secondary | ICD-10-CM | POA: Diagnosis not present

## 2018-12-22 DIAGNOSIS — I10 Essential (primary) hypertension: Secondary | ICD-10-CM | POA: Insufficient documentation

## 2018-12-22 DIAGNOSIS — Z79899 Other long term (current) drug therapy: Secondary | ICD-10-CM | POA: Insufficient documentation

## 2018-12-22 DIAGNOSIS — Z9484 Stem cells transplant status: Secondary | ICD-10-CM | POA: Insufficient documentation

## 2018-12-22 DIAGNOSIS — Z7982 Long term (current) use of aspirin: Secondary | ICD-10-CM | POA: Diagnosis not present

## 2018-12-22 DIAGNOSIS — F419 Anxiety disorder, unspecified: Secondary | ICD-10-CM

## 2018-12-22 DIAGNOSIS — C9000 Multiple myeloma not having achieved remission: Secondary | ICD-10-CM

## 2018-12-22 DIAGNOSIS — Z7984 Long term (current) use of oral hypoglycemic drugs: Secondary | ICD-10-CM

## 2018-12-22 LAB — COMPREHENSIVE METABOLIC PANEL
ALT: 11 U/L (ref 0–44)
AST: 15 U/L (ref 15–41)
Albumin: 4.3 g/dL (ref 3.5–5.0)
Alkaline Phosphatase: 54 U/L (ref 38–126)
Anion gap: 10 (ref 5–15)
BUN: 38 mg/dL — ABNORMAL HIGH (ref 8–23)
CO2: 25 mmol/L (ref 22–32)
Calcium: 9.2 mg/dL (ref 8.9–10.3)
Chloride: 103 mmol/L (ref 98–111)
Creatinine, Ser: 1.73 mg/dL — ABNORMAL HIGH (ref 0.44–1.00)
GFR calc Af Amer: 34 mL/min — ABNORMAL LOW (ref >60)
GFR calc non Af Amer: 29 mL/min — ABNORMAL LOW (ref >60)
Glucose, Bld: 139 mg/dL — ABNORMAL HIGH (ref 70–99)
Potassium: 5.4 mmol/L — ABNORMAL HIGH (ref 3.5–5.1)
Sodium: 138 mmol/L (ref 135–145)
Total Bilirubin: 0.3 mg/dL (ref 0.3–1.2)
Total Protein: 6.9 g/dL (ref 6.5–8.1)

## 2018-12-22 LAB — CBC WITH DIFFERENTIAL/PLATELET
Abs Immature Granulocytes: 0.01 10*3/uL (ref 0.00–0.07)
Basophils Absolute: 0 10*3/uL (ref 0.0–0.1)
Basophils Relative: 1 %
Eosinophils Absolute: 0.3 10*3/uL (ref 0.0–0.5)
Eosinophils Relative: 4 %
HCT: 31 % — ABNORMAL LOW (ref 36.0–46.0)
Hemoglobin: 10.1 g/dL — ABNORMAL LOW (ref 12.0–15.0)
Immature Granulocytes: 0 %
Lymphocytes Relative: 14 %
Lymphs Abs: 0.8 10*3/uL (ref 0.7–4.0)
MCH: 33 pg (ref 26.0–34.0)
MCHC: 32.6 g/dL (ref 30.0–36.0)
MCV: 101.3 fL — ABNORMAL HIGH (ref 80.0–100.0)
Monocytes Absolute: 0.5 10*3/uL (ref 0.1–1.0)
Monocytes Relative: 9 %
Neutro Abs: 4.1 10*3/uL (ref 1.7–7.7)
Neutrophils Relative %: 72 %
Platelets: 157 10*3/uL (ref 150–400)
RBC: 3.06 MIL/uL — ABNORMAL LOW (ref 3.87–5.11)
RDW: 13.8 % (ref 11.5–15.5)
WBC: 5.8 10*3/uL (ref 4.0–10.5)
nRBC: 0 % (ref 0.0–0.2)

## 2018-12-22 LAB — SAMPLE TO BLOOD BANK

## 2018-12-22 LAB — MAGNESIUM: Magnesium: 1.9 mg/dL (ref 1.7–2.4)

## 2018-12-22 MED ORDER — OCTREOTIDE ACETATE 30 MG IM KIT
PACK | INTRAMUSCULAR | Status: AC
  Filled 2018-12-22: qty 1

## 2018-12-22 MED ORDER — HYDROCODONE-ACETAMINOPHEN 7.5-325 MG PO TABS: 1.0000 | ORAL_TABLET | Freq: Four times a day (QID) | ORAL | 0 refills | Status: DC | PRN

## 2018-12-22 NOTE — Progress Notes (Signed)
Patient called clinic stated that she forgot to talk to the doctor this morning regarding her feeling teary all the time and a little depressed.  She wanted to know if she can take more of her Xanax.    I talked with Dr. Delton Coombes and he gave orders for patient to take the same dose during the day but at night to take 2 pills in stead of 1.  I told patient to call me back in a few days and let us know if that helps her. If it does, we will call in a new prescription.  She verbalizes understanding.

## 2018-12-22 NOTE — Assessment & Plan Note (Addendum)
.  1.  IgG lambda plasma cell myeloma, stage II by R-ISS: - Presentation to [facility] with left-sided rib pain on 12/29/2017, found to have hypercalcemia, anemia and acute kidney injury, further work-up showed 3.2 g/dL of IgG lambda monoclonal gammopathy. -Skeletal survey reportedly demonstrated diffuse osteopenia and multiple lytic lesions in bilateral lower extremities. -Bone marrow biopsy on 12/31/2017 showed hypercellular marrow with 40 to 50% cellularity, involved by plasma cell neoplasm (17% plasma cells by manual aspirate differential count, greater than 90% and 30 to 40% by CD138 immunohistochemical analysis of clot and core respectively, and monotypic lambda by flow cytometry analysis) - Chromosome analysis showed 12 of 20 cells examined are normal, 46, X6.  The remaining 8 metaphase cells analyzed are abnormal and represent a hyperdiploid clone containing 1 extra copy each of chromosome 3, 5, 6, 7, 9, 11, 15, 19, 20, 21, 22 as well as a marker chromosome. -Multiple myeloma FISH panel shows nuclei positive for three 1q signals, trisomy 11, and extra signals of chromosome 7, 9 and 15. -On 04/07/2018, M spike was 4 g of IgG lambda.  LDH was 115.  Beta-2 microglobulin-12.8.  Light chain ratio is 0, Lambda light chains were elevated at 59,667. -Bone survey showed large expansile lytic lesion in the left shoulder, multiple lytic lesions in all bones. -4 cycles of KCyd from 04/17/2018 through 08/21/2018. - Autologous stem cell transplant on 10/23/2018. -We reviewed her blood work.  White count and platelet count recovered.  Potassium was 5.4.  I have asked her to check her multivitamin and if it has potassium, discontinue it. - She has on and off weakness every few days.  She has an appointment at [facility] in the first week of June.  I will see her back in 5 weeks for follow-up.  We will consider restarting her denosumab at that time.  2.  ID prophylaxis: -She will continue acyclovir  daily.  3.  Back pain: -She is currently taking hydrocodone 5/325 every 6 hours.  She reports that it is not lasting 6 hours. -I will increase hydrocodone to 7.5/325 every 6 hours.
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
## 2018-12-23 ENCOUNTER — Other Ambulatory Visit (HOSPITAL_COMMUNITY): Payer: Self-pay | Admitting: *Deleted

## 2018-12-25 ENCOUNTER — Other Ambulatory Visit (HOSPITAL_COMMUNITY): Payer: Self-pay | Admitting: *Deleted

## 2018-12-25 DIAGNOSIS — C9000 Multiple myeloma not having achieved remission: Secondary | ICD-10-CM

## 2018-12-25 DIAGNOSIS — F419 Anxiety disorder, unspecified: Secondary | ICD-10-CM

## 2018-12-26 MED ORDER — ALPRAZOLAM 0.25 MG PO TABS: ORAL_TABLET | ORAL | 0 refills | Status: DC

## 2018-12-29 ENCOUNTER — Other Ambulatory Visit (HOSPITAL_COMMUNITY): Payer: Self-pay | Admitting: *Deleted

## 2018-12-29 NOTE — Telephone Encounter (Signed)
Refill requests received from [facility] in [place] for Prinivil and glipizide.  Notified [facility] per patient she is no longer on the Prinivil per [facility] and the glipizide needs to be sent to her PCP.

## 2019-01-05 ENCOUNTER — Other Ambulatory Visit (HOSPITAL_COMMUNITY): Payer: Self-pay | Admitting: *Deleted

## 2019-01-05 MED ORDER — HYDROCODONE-ACETAMINOPHEN 7.5-325 MG PO TABS: 1.0000 | ORAL_TABLET | Freq: Four times a day (QID) | ORAL | 0 refills | Status: DC | PRN

## 2019-01-06 ENCOUNTER — Other Ambulatory Visit (HOSPITAL_COMMUNITY): Payer: Self-pay | Admitting: *Deleted

## 2019-01-06 DIAGNOSIS — G479 Sleep disorder, unspecified: Secondary | ICD-10-CM

## 2019-01-06 MED ORDER — ZOLPIDEM TARTRATE 10 MG PO TABS: 10.0000 mg | ORAL_TABLET | Freq: Every evening | ORAL | 0 refills | Status: DC | PRN

## 2019-01-08 ENCOUNTER — Other Ambulatory Visit (HOSPITAL_COMMUNITY): Payer: Self-pay | Admitting: *Deleted

## 2019-01-08 DIAGNOSIS — G479 Sleep disorder, unspecified: Secondary | ICD-10-CM

## 2019-01-13 ENCOUNTER — Other Ambulatory Visit (HOSPITAL_COMMUNITY): Payer: Self-pay | Admitting: *Deleted

## 2019-01-13 DIAGNOSIS — C9000 Multiple myeloma not having achieved remission: Secondary | ICD-10-CM

## 2019-01-13 MED ORDER — GLIPIZIDE ER 5 MG PO TB24: 5.0000 mg | ORAL_TABLET | Freq: Every day | ORAL | 1 refills | Status: DC

## 2019-01-26 ENCOUNTER — Inpatient Hospital Stay (HOSPITAL_COMMUNITY): Payer: Medicaid Other | Attending: Hematology | Admitting: Hematology

## 2019-01-26 ENCOUNTER — Encounter (HOSPITAL_COMMUNITY): Payer: Self-pay | Admitting: Hematology

## 2019-01-26 ENCOUNTER — Other Ambulatory Visit: Payer: Self-pay

## 2019-01-26 ENCOUNTER — Inpatient Hospital Stay (HOSPITAL_COMMUNITY): Payer: Medicaid Other

## 2019-01-26 VITALS — BP 154/65 | HR 69 | Temp 99.0°F | Resp 18 | Wt 159.0 lb

## 2019-01-26 DIAGNOSIS — C9000 Multiple myeloma not having achieved remission: Secondary | ICD-10-CM | POA: Insufficient documentation

## 2019-01-26 LAB — CBC WITH DIFFERENTIAL/PLATELET
Abs Immature Granulocytes: 0.01 10*3/uL (ref 0.00–0.07)
Basophils Absolute: 0 10*3/uL (ref 0.0–0.1)
Basophils Relative: 1 %
Eosinophils Absolute: 0.2 10*3/uL (ref 0.0–0.5)
Eosinophils Relative: 4 %
HCT: 30.3 % — ABNORMAL LOW (ref 36.0–46.0)
Hemoglobin: 9.8 g/dL — ABNORMAL LOW (ref 12.0–15.0)
Immature Granulocytes: 0 %
Lymphocytes Relative: 18 %
Lymphs Abs: 1 10*3/uL (ref 0.7–4.0)
MCH: 33.3 pg (ref 26.0–34.0)
MCHC: 32.3 g/dL (ref 30.0–36.0)
MCV: 103.1 fL — ABNORMAL HIGH (ref 80.0–100.0)
Monocytes Absolute: 0.5 10*3/uL (ref 0.1–1.0)
Monocytes Relative: 8 %
Neutro Abs: 3.8 10*3/uL (ref 1.7–7.7)
Neutrophils Relative %: 69 %
Platelets: 181 10*3/uL (ref 150–400)
RBC: 2.94 MIL/uL — ABNORMAL LOW (ref 3.87–5.11)
RDW: 12.8 % (ref 11.5–15.5)
WBC: 5.5 10*3/uL (ref 4.0–10.5)
nRBC: 0 % (ref 0.0–0.2)

## 2019-01-26 LAB — COMPREHENSIVE METABOLIC PANEL
ALT: 14 U/L (ref 0–44)
AST: 16 U/L (ref 15–41)
Albumin: 4.5 g/dL (ref 3.5–5.0)
Alkaline Phosphatase: 58 U/L (ref 38–126)
Anion gap: 14 (ref 5–15)
BUN: 32 mg/dL — ABNORMAL HIGH (ref 8–23)
CO2: 23 mmol/L (ref 22–32)
Calcium: 8.8 mg/dL — ABNORMAL LOW (ref 8.9–10.3)
Chloride: 103 mmol/L (ref 98–111)
Creatinine, Ser: 1.75 mg/dL — ABNORMAL HIGH (ref 0.44–1.00)
GFR calc Af Amer: 34 mL/min — ABNORMAL LOW (ref >60)
GFR calc non Af Amer: 29 mL/min — ABNORMAL LOW (ref >60)
Glucose, Bld: 96 mg/dL (ref 70–99)
Potassium: 4.6 mmol/L (ref 3.5–5.1)
Sodium: 140 mmol/L (ref 135–145)
Total Bilirubin: 0.4 mg/dL (ref 0.3–1.2)
Total Protein: 7 g/dL (ref 6.5–8.1)

## 2019-01-26 LAB — SAMPLE TO BLOOD BANK

## 2019-01-26 NOTE — Patient Instructions (Addendum)
[facility] at [facility] Discharge Instructions  You were seen today by Dr. Katragadda. He went over your recent lab results. He will see you back in 2 weeks for labs and follow up.   Thank you for choosing [facility] at [facility] to provide your oncology and hematology care.  To afford each patient quality time with our provider, please arrive at least 15 minutes before your scheduled appointment time.   If you have a lab appointment with the Cancer Center please come in thru the  Main Entrance and check in at the main information desk  You need to re-schedule your appointment should you arrive 10 or more minutes late.  We strive to give you quality time with our providers, and arriving late affects you and other patients whose appointments are after yours.  Also, if you no show three or more times for appointments you may be dismissed from the clinic at the providers discretion.     Again, thank you for choosing [facility].  Our hope is that these requests will decrease the amount of time that you wait before being seen by our physicians.       _____________________________________________________________  Should you have questions after your visit to [facility], please contact our office at [phone] between the hours of 8:00 a.m. and 4:30 p.m.  Voicemails left after 4:00 p.m. will not be returned until the following business day.  For prescription refill requests, have your pharmacy contact our office and allow 72 hours.    Cancer Center Support Programs:   > Cancer Support Group  2nd Tuesday of the month 1pm-2pm, Journey Room    

## 2019-01-26 NOTE — Assessment & Plan Note (Addendum)
.  1.  IgG lambda plasma cell myeloma, stage II by R-ISS (standard risk): - Presentation to [facility] with left-sided rib pain on 12/29/2017, found to have hypercalcemia, anemia and acute kidney injury, further work-up showed 3.2 g/dL of IgG lambda monoclonal gammopathy. -Skeletal survey reportedly demonstrated diffuse osteopenia and multiple lytic lesions in bilateral lower extremities. -Bone marrow biopsy on 12/31/2017 showed hypercellular marrow with 40 to 50% cellularity, involved by plasma cell neoplasm (17% plasma cells by manual aspirate differential count, greater than 90% and 30 to 40% by CD138 immunohistochemical analysis of clot and core respectively, and monotypic lambda by flow cytometry analysis) - Chromosome analysis showed 12 of 20 cells examined are normal, 46, X6.  The remaining 8 metaphase cells analyzed are abnormal and represent a hyperdiploid clone containing 1 extra copy each of chromosome 3, 5, 6, 7, 9, 11, 15, 19, 20, 21, 22 as well as a marker chromosome. -Multiple myeloma FISH panel shows nuclei positive for three 1q signals, trisomy 11, and extra signals of chromosome 7, 9 and 15. -On 04/07/2018, M spike was 4 g of IgG lambda.  LDH was 115.  Beta-2 microglobulin-12.8.  Light chain ratio is 0, Lambda light chains were elevated at 59,667. -Bone survey showed large expansile lytic lesion in the left shoulder, multiple lytic lesions in all bones. -First cycle of KCyD on 04/17/2018, cycle 2 with KRD, developed extensive skin rash. -Cycle 3 and 4 completed on 08/21/2018 with low-dose pomalidomide 2 mg.    - Autologous stem cell transplant on 10/23/2018. -She reportedly had bone marrow biopsy done on 01/21/2019.  She also had a PET CT scan done.  Myeloma panel was also done. -She has a follow-up at [facility] on 01/28/2019.  Based on the findings, we will decide maintenance options. - I think we can still try Revlimid 10 mg dose and see if she tolerates it. - We will start her back on bone  protecting agents.  2.  ID prophylaxis: -She will continue acyclovir daily.  3.  Back pain: -She will continue hydrocodone 7.5/325 every 6 hours as needed.
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
## 2019-01-27 LAB — IMMUNOFIXATION ELECTROPHORESIS
IgA: 54 mg/dL — ABNORMAL LOW (ref 87–352)
IgG (Immunoglobin G), Serum: 669 mg/dL (ref 586–1602)
IgM (Immunoglobulin M), Srm: 17 mg/dL — ABNORMAL LOW (ref 26–217)
Total Protein ELP: 6.2 g/dL (ref 6.0–8.5)

## 2019-01-27 LAB — KAPPA/LAMBDA LIGHT CHAINS
Kappa free light chain: 14.1 mg/L (ref 3.3–19.4)
Kappa, lambda light chain ratio: 1.44 (ref 0.26–1.65)
Lambda free light chains: 9.8 mg/L (ref 5.7–26.3)

## 2019-01-27 LAB — PROTEIN ELECTROPHORESIS, SERUM
A/G Ratio: 1.6 (ref 0.7–1.7)
Albumin ELP: 3.8 g/dL (ref 2.9–4.4)
Alpha-1-Globulin: 0.2 g/dL (ref 0.0–0.4)
Alpha-2-Globulin: 0.8 g/dL (ref 0.4–1.0)
Beta Globulin: 1 g/dL (ref 0.7–1.3)
Gamma Globulin: 0.4 g/dL (ref 0.4–1.8)
Globulin, Total: 2.4 g/dL (ref 2.2–3.9)
Total Protein ELP: 6.2 g/dL (ref 6.0–8.5)

## 2019-01-29 ENCOUNTER — Other Ambulatory Visit (HOSPITAL_COMMUNITY): Payer: Self-pay | Admitting: *Deleted

## 2019-01-29 DIAGNOSIS — F419 Anxiety disorder, unspecified: Secondary | ICD-10-CM

## 2019-01-29 DIAGNOSIS — C9000 Multiple myeloma not having achieved remission: Secondary | ICD-10-CM

## 2019-01-30 MED ORDER — ALPRAZOLAM 0.25 MG PO TABS: ORAL_TABLET | ORAL | 0 refills | Status: DC

## 2019-02-03 ENCOUNTER — Other Ambulatory Visit (HOSPITAL_COMMUNITY): Payer: Self-pay | Admitting: *Deleted

## 2019-02-03 DIAGNOSIS — G479 Sleep disorder, unspecified: Secondary | ICD-10-CM

## 2019-02-03 MED ORDER — HYDROCODONE-ACETAMINOPHEN 7.5-325 MG PO TABS: 1.0000 | ORAL_TABLET | Freq: Four times a day (QID) | ORAL | 0 refills | Status: DC | PRN

## 2019-02-03 MED ORDER — ZOLPIDEM TARTRATE 10 MG PO TABS: 10.0000 mg | ORAL_TABLET | Freq: Every evening | ORAL | 0 refills | Status: DC | PRN

## 2019-02-11 ENCOUNTER — Other Ambulatory Visit: Payer: Self-pay

## 2019-02-12 ENCOUNTER — Other Ambulatory Visit (HOSPITAL_COMMUNITY): Payer: Self-pay | Admitting: *Deleted

## 2019-02-12 ENCOUNTER — Inpatient Hospital Stay (HOSPITAL_COMMUNITY): Payer: Medicaid Other | Admitting: Hematology

## 2019-02-12 ENCOUNTER — Inpatient Hospital Stay (HOSPITAL_COMMUNITY): Payer: Medicaid Other

## 2019-02-12 ENCOUNTER — Other Ambulatory Visit (HOSPITAL_COMMUNITY): Payer: Medicaid Other

## 2019-02-12 ENCOUNTER — Encounter (HOSPITAL_COMMUNITY): Payer: Self-pay | Admitting: Hematology

## 2019-02-12 ENCOUNTER — Inpatient Hospital Stay (HOSPITAL_BASED_OUTPATIENT_CLINIC_OR_DEPARTMENT_OTHER): Payer: Medicaid Other | Admitting: Hematology

## 2019-02-12 VITALS — BP 144/66 | HR 71 | Temp 98.6°F | Resp 16 | Wt 161.0 lb

## 2019-02-12 DIAGNOSIS — C9000 Multiple myeloma not having achieved remission: Secondary | ICD-10-CM

## 2019-02-12 LAB — CBC WITH DIFFERENTIAL/PLATELET
Abs Immature Granulocytes: 0.01 10*3/uL (ref 0.00–0.07)
Basophils Absolute: 0 10*3/uL (ref 0.0–0.1)
Basophils Relative: 1 %
Eosinophils Absolute: 0.3 10*3/uL (ref 0.0–0.5)
Eosinophils Relative: 5 %
HCT: 30 % — ABNORMAL LOW (ref 36.0–46.0)
Hemoglobin: 9.6 g/dL — ABNORMAL LOW (ref 12.0–15.0)
Immature Granulocytes: 0 %
Lymphocytes Relative: 16 %
Lymphs Abs: 0.9 10*3/uL (ref 0.7–4.0)
MCH: 33.1 pg (ref 26.0–34.0)
MCHC: 32 g/dL (ref 30.0–36.0)
MCV: 103.4 fL — ABNORMAL HIGH (ref 80.0–100.0)
Monocytes Absolute: 0.4 10*3/uL (ref 0.1–1.0)
Monocytes Relative: 7 %
Neutro Abs: 4 10*3/uL (ref 1.7–7.7)
Neutrophils Relative %: 71 %
Platelets: 172 10*3/uL (ref 150–400)
RBC: 2.9 MIL/uL — ABNORMAL LOW (ref 3.87–5.11)
RDW: 11.9 % (ref 11.5–15.5)
WBC: 5.6 10*3/uL (ref 4.0–10.5)
nRBC: 0 % (ref 0.0–0.2)

## 2019-02-12 LAB — COMPREHENSIVE METABOLIC PANEL
ALT: 12 U/L (ref 0–44)
AST: 17 U/L (ref 15–41)
Albumin: 4.5 g/dL (ref 3.5–5.0)
Alkaline Phosphatase: 64 U/L (ref 38–126)
Anion gap: 13 (ref 5–15)
BUN: 28 mg/dL — ABNORMAL HIGH (ref 8–23)
CO2: 28 mmol/L (ref 22–32)
Calcium: 9.6 mg/dL (ref 8.9–10.3)
Chloride: 100 mmol/L (ref 98–111)
Creatinine, Ser: 1.91 mg/dL — ABNORMAL HIGH (ref 0.44–1.00)
GFR calc Af Amer: 30 mL/min — ABNORMAL LOW (ref >60)
GFR calc non Af Amer: 26 mL/min — ABNORMAL LOW (ref >60)
Glucose, Bld: 131 mg/dL — ABNORMAL HIGH (ref 70–99)
Potassium: 5 mmol/L (ref 3.5–5.1)
Sodium: 141 mmol/L (ref 135–145)
Total Bilirubin: 0.6 mg/dL (ref 0.3–1.2)
Total Protein: 7.1 g/dL (ref 6.5–8.1)

## 2019-02-12 LAB — SAMPLE TO BLOOD BANK

## 2019-02-12 LAB — LACTATE DEHYDROGENASE: LDH: 130 U/L (ref 98–192)

## 2019-02-12 MED ORDER — POMALIDOMIDE 2 MG PO CAPS: 2.0000 mg | ORAL_CAPSULE | Freq: Every day | ORAL | 1 refills | Status: DC

## 2019-02-12 MED ORDER — DENOSUMAB 120 MG/1.7ML ~~LOC~~ SOLN
SUBCUTANEOUS | Status: AC
  Filled 2019-02-12: qty 1.7

## 2019-02-12 MED ORDER — DENOSUMAB 120 MG/1.7ML ~~LOC~~ SOLN
120.0000 mg | Freq: Once | SUBCUTANEOUS | Status: AC
  Administered 2019-02-12: 120 mg via SUBCUTANEOUS

## 2019-02-12 MED ORDER — ACYCLOVIR 400 MG PO TABS: 400.0000 mg | ORAL_TABLET | Freq: Two times a day (BID) | ORAL | 4 refills | Status: DC
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
## 2019-02-12 NOTE — Assessment & Plan Note (Addendum)
.  1.  IgG lambda plasma cell myeloma, stage II by R-ISS (standard risk): - Presentation to [facility] with left-sided rib pain on 12/29/2017, found to have hypercalcemia, anemia and acute kidney injury, further work-up showed 3.2 g/dL of IgG lambda monoclonal gammopathy. -Skeletal survey reportedly demonstrated diffuse osteopenia and multiple lytic lesions in bilateral lower extremities. -Bone marrow biopsy on 12/31/2017 showed hypercellular marrow with 40 to 50% cellularity, involved by plasma cell neoplasm (17% plasma cells by manual aspirate differential count, greater than 90% and 30 to 40% by CD138 immunohistochemical analysis of clot and core respectively, and monotypic lambda by flow cytometry analysis) - Chromosome analysis showed 12 of 20 cells examined are normal, 46, X6.  The remaining 8 metaphase cells analyzed are abnormal and represent a hyperdiploid clone containing 1 extra copy each of chromosome 3, 5, 6, 7, 9, 11, 15, 19, 20, 21, 22 as well as a marker chromosome. -Multiple myeloma FISH panel shows nuclei positive for three 1q signals, trisomy 11, and extra signals of chromosome 7, 9 and 15. -On 04/07/2018, M spike was 4 g of IgG lambda.  LDH was 115.  Beta-2 microglobulin-12.8.  Light chain ratio is 0, Lambda light chains were elevated at 59,667. -Bone survey showed large expansile lytic lesion in the left shoulder, multiple lytic lesions in all bones. -First cycle of KCyD on 04/17/2018, cycle 2 with KRD, developed extensive skin rash. -Cycle 3 and 4 completed on 08/21/2018 with low-dose pomalidomide 2 mg.    - Autologous stem cell transplant on 10/23/2018. -PET/CT scan on 01/21/2019 shows multiple bone lesions but without any hypermetabolic activity. -Bone marrow biopsy on 01/21/2019 shows normocellular trilineage hematopoiesis with maturation, no increase in blasts.  No increased plasma cells (less than 1% by CD138 IHC).  Normal cytogenetics and FISH negative for myeloma.  MRD was pending as of  01/28/2019. - We have recommended starting maintenance therapy.  She had severe rash with Revlimid during induction regimen.  Hence we will start her on pomalidomide 2 mg 3 weeks on 1 week off.  We discussed the side effects in detail.  We will send a prescription to her pharmacy. - We will see her back in 4 weeks for follow-up with repeat labs.  2.  ID prophylaxis: -We will continue acyclovir 400 mg twice daily.  3.  Back pain: -This is from multiple myeloma. -She is taking hydrocodone 7.5 mg 4 times a day which is controlling it.  4.  Bone strengthening: - Because of her renal insufficiency, we are avoiding Zometa. -She will restart denosumab today.  She will continue calcium and vitamin D supplements.  5.  Chest pains: - She is complaining of retrosternal pressure-like pain on and off for the last 1 month.  Pain typically lasts about 30 minutes.  It happens during nighttime and daytime but mostly on exertion.  She denies any history of CAD in the past. -I have recommended consultation with cardiology for further work-up.

## 2019-02-12 NOTE — Patient Instructions (Addendum)
[place] at [facility] Discharge Instructions  You were seen today by Dr. Delton Coombes. He went over your recent lab results. He would like you to start Xgeva injections monthly. He will see you back in 4 weeks for labs and follow up.   Thank you for choosing [place] at [facility] to provide your oncology and hematology care.  To afford each patient quality time with our provider, please arrive at least 15 minutes before your scheduled appointment time.   If you have a lab appointment with the [facility] please come in thru the  Main Entrance and check in at the main information desk  You need to re-schedule your appointment should you arrive 10 or more minutes late.  We strive to give you quality time with our providers, and arriving late affects you and other patients whose appointments are after yours.  Also, if you no show three or more times for appointments you may be dismissed from the clinic at the providers discretion.     Again, thank you for choosing [facility].  Our hope is that these requests will decrease the amount of time that you wait before being seen by our physicians.       _____________________________________________________________  Should you have questions after your visit to [facility], please contact our office at [phone] between the hours of 8:00 a.m. and 4:30 p.m.  Voicemails left after 4:00 p.m. will not be returned until the following business day.  For prescription refill requests, have your pharmacy contact our office and allow 72 hours.    Cancer Center Support Programs:   > Cancer Support Group  2nd Tuesday of the month 1pm-2pm, Journey Room

## 2019-02-12 NOTE — Progress Notes (Signed)
Patient seen by Dr. Delton Coombes and Delton See injection administered by front office nurse.

## 2019-02-13 LAB — PROTEIN ELECTROPHORESIS, SERUM
A/G Ratio: 1.7 (ref 0.7–1.7)
Albumin ELP: 4.1 g/dL (ref 2.9–4.4)
Alpha-1-Globulin: 0.2 g/dL (ref 0.0–0.4)
Alpha-2-Globulin: 1 g/dL (ref 0.4–1.0)
Beta Globulin: 0.7 g/dL (ref 0.7–1.3)
Gamma Globulin: 0.5 g/dL (ref 0.4–1.8)
Globulin, Total: 2.4 g/dL (ref 2.2–3.9)
Total Protein ELP: 6.5 g/dL (ref 6.0–8.5)

## 2019-02-13 LAB — IMMUNOFIXATION ELECTROPHORESIS
IgA: 57 mg/dL — ABNORMAL LOW (ref 87–352)
IgG (Immunoglobin G), Serum: 664 mg/dL (ref 586–1602)
IgM (Immunoglobulin M), Srm: 18 mg/dL — ABNORMAL LOW (ref 26–217)
Total Protein ELP: 6.5 g/dL (ref 6.0–8.5)

## 2019-02-13 LAB — KAPPA/LAMBDA LIGHT CHAINS
Kappa free light chain: 15.7 mg/L (ref 3.3–19.4)
Kappa, lambda light chain ratio: 1.41 (ref 0.26–1.65)
Lambda free light chains: 11.1 mg/L (ref 5.7–26.3)

## 2019-02-16 ENCOUNTER — Other Ambulatory Visit (HOSPITAL_COMMUNITY): Payer: Self-pay | Admitting: *Deleted

## 2019-02-16 ENCOUNTER — Encounter (HOSPITAL_COMMUNITY): Payer: Self-pay | Admitting: *Deleted

## 2019-02-16 DIAGNOSIS — R42 Dizziness and giddiness: Secondary | ICD-10-CM

## 2019-02-16 DIAGNOSIS — C9000 Multiple myeloma not having achieved remission: Secondary | ICD-10-CM

## 2019-02-16 MED ORDER — PANTOPRAZOLE SODIUM 40 MG PO TBEC: 40.0000 mg | DELAYED_RELEASE_TABLET | Freq: Every day | ORAL | 2 refills | Status: DC

## 2019-02-16 MED ORDER — POMALIDOMIDE 2 MG PO CAPS: 2.0000 mg | ORAL_CAPSULE | Freq: Every day | ORAL | 1 refills | Status: DC

## 2019-02-16 NOTE — Progress Notes (Signed)
Patient called today with questions about her medications.  I provided education on how to take her acyclovir.  I explained to her that she needs to take the lower dose twice daily.  I have called in a refill for her pantoprazole.  I explained that her pomalyst will be sent to a specialty pharmacy and it will be the same process as before. They will contact her for a survey and then they will set up delivery.  She verbalizes appreciation and understanding.

## 2019-02-23 ENCOUNTER — Other Ambulatory Visit (HOSPITAL_COMMUNITY): Payer: Self-pay | Admitting: *Deleted

## 2019-02-23 DIAGNOSIS — C9000 Multiple myeloma not having achieved remission: Secondary | ICD-10-CM

## 2019-02-23 MED ORDER — GLIPIZIDE ER 5 MG PO TB24: 5.0000 mg | ORAL_TABLET | Freq: Every day | ORAL | 3 refills | Status: DC

## 2019-02-25 ENCOUNTER — Other Ambulatory Visit (HOSPITAL_COMMUNITY): Payer: Self-pay | Admitting: *Deleted

## 2019-02-25 DIAGNOSIS — F419 Anxiety disorder, unspecified: Secondary | ICD-10-CM

## 2019-02-25 DIAGNOSIS — C9000 Multiple myeloma not having achieved remission: Secondary | ICD-10-CM

## 2019-02-25 MED ORDER — ALPRAZOLAM 0.25 MG PO TABS: ORAL_TABLET | ORAL | 0 refills | Status: DC

## 2019-02-27 ENCOUNTER — Telehealth (HOSPITAL_COMMUNITY): Payer: Self-pay | Admitting: *Deleted

## 2019-02-27 ENCOUNTER — Other Ambulatory Visit (HOSPITAL_COMMUNITY): Payer: Self-pay | Admitting: Hematology

## 2019-02-27 ENCOUNTER — Other Ambulatory Visit (HOSPITAL_COMMUNITY): Payer: Self-pay | Admitting: *Deleted

## 2019-02-27 MED ORDER — AZITHROMYCIN 500 MG PO TABS: 500.0000 mg | ORAL_TABLET | Freq: Every day | ORAL | 0 refills | Status: DC

## 2019-02-27 NOTE — Telephone Encounter (Signed)
Pt called clinic reporting that she continues to have symptoms of cold sweats, pounding HA, back ache, insomnia, and decreased appetite.  Reports n/v/d on Saturday, 7/4, but denies any since that time.  Denies urinary s/s; denies fever.  Pt reports she has been on amoxicillin since 7/2 after having some dental work done.  She will complete the amoxicillin today, and is supposed to restart her Pomalyst tomorrow, 7/11.  Discussed with Reynolds Bowl, NP.  Per NP, pt has been instructed to stop the amoxicillin.  Rx for azithromycin sent to pt's pharmacy - instructed pt to start taking today and to hold Pomalyst until further notice.  Also advised per NP to report to the ED by tomorrow evening for further workup for infection if she continues to have the above s/s after starting azithromycin.  Pt verbalizes understanding and agrees to do so.

## 2019-03-02 ENCOUNTER — Telehealth (HOSPITAL_COMMUNITY): Payer: Self-pay | Admitting: *Deleted

## 2019-03-02 NOTE — Telephone Encounter (Signed)
Patient called clinic this morning stating that her depression was getting a little bit overwhelming for her. She reports crying all day yesterday.  She states " sometimes I just want to crawl in a hole.  It wouldn't be too bad waking up in heaven".  I asked her if she was having suicidal ideations and she stated "No, I just want to feel better and right now I feel useless".   Patient is currently finishing up antibiotic for dental infection.   I spoke with Francene Finders, NP and she advised for patient to take an extra dose of her Xanax during the day to help with her anxiety.   I advised the patient to call us or 911 immediately if she develops any ideations of suicide.  I told her to call me later in the week if she was not feeling any better.  She verbalizes understanding.

## 2019-03-04 ENCOUNTER — Other Ambulatory Visit (HOSPITAL_COMMUNITY): Payer: Self-pay | Admitting: *Deleted

## 2019-03-04 MED ORDER — HYDROCODONE-ACETAMINOPHEN 7.5-325 MG PO TABS: 1.0000 | ORAL_TABLET | Freq: Four times a day (QID) | ORAL | 0 refills | Status: DC | PRN

## 2019-03-09 ENCOUNTER — Other Ambulatory Visit (HOSPITAL_COMMUNITY): Payer: Self-pay | Admitting: Hematology

## 2019-03-09 ENCOUNTER — Other Ambulatory Visit (HOSPITAL_COMMUNITY): Payer: Self-pay | Admitting: Nurse Practitioner

## 2019-03-09 DIAGNOSIS — G479 Sleep disorder, unspecified: Secondary | ICD-10-CM

## 2019-03-09 DIAGNOSIS — C9000 Multiple myeloma not having achieved remission: Secondary | ICD-10-CM

## 2019-03-09 MED ORDER — ZOLPIDEM TARTRATE 10 MG PO TABS: 10.0000 mg | ORAL_TABLET | Freq: Every evening | ORAL | 0 refills | Status: DC | PRN

## 2019-03-10 ENCOUNTER — Encounter (HOSPITAL_COMMUNITY): Payer: Self-pay | Admitting: Hematology

## 2019-03-10 ENCOUNTER — Inpatient Hospital Stay (HOSPITAL_COMMUNITY): Payer: Medicare Other

## 2019-03-10 ENCOUNTER — Inpatient Hospital Stay (HOSPITAL_COMMUNITY): Payer: Medicare Other | Attending: Hematology | Admitting: Hematology

## 2019-03-10 ENCOUNTER — Other Ambulatory Visit: Payer: Self-pay

## 2019-03-10 VITALS — BP 152/64 | HR 70 | Temp 99.1°F | Resp 18 | Wt 158.6 lb

## 2019-03-10 DIAGNOSIS — N289 Disorder of kidney and ureter, unspecified: Secondary | ICD-10-CM | POA: Diagnosis not present

## 2019-03-10 DIAGNOSIS — C9000 Multiple myeloma not having achieved remission: Secondary | ICD-10-CM

## 2019-03-10 LAB — CBC WITH DIFFERENTIAL/PLATELET
Abs Immature Granulocytes: 0.01 10*3/uL (ref 0.00–0.07)
Basophils Absolute: 0 10*3/uL (ref 0.0–0.1)
Basophils Relative: 1 %
Eosinophils Absolute: 0.4 10*3/uL (ref 0.0–0.5)
Eosinophils Relative: 8 %
HCT: 29.5 % — ABNORMAL LOW (ref 36.0–46.0)
Hemoglobin: 9.6 g/dL — ABNORMAL LOW (ref 12.0–15.0)
Immature Granulocytes: 0 %
Lymphocytes Relative: 26 %
Lymphs Abs: 1.3 10*3/uL (ref 0.7–4.0)
MCH: 33.1 pg (ref 26.0–34.0)
MCHC: 32.5 g/dL (ref 30.0–36.0)
MCV: 101.7 fL — ABNORMAL HIGH (ref 80.0–100.0)
Monocytes Absolute: 0.6 10*3/uL (ref 0.1–1.0)
Monocytes Relative: 11 %
Neutro Abs: 2.7 10*3/uL (ref 1.7–7.7)
Neutrophils Relative %: 54 %
Platelets: 186 10*3/uL (ref 150–400)
RBC: 2.9 MIL/uL — ABNORMAL LOW (ref 3.87–5.11)
RDW: 12.3 % (ref 11.5–15.5)
WBC: 5.1 10*3/uL (ref 4.0–10.5)
nRBC: 0 % (ref 0.0–0.2)

## 2019-03-10 LAB — COMPREHENSIVE METABOLIC PANEL
ALT: 12 U/L (ref 0–44)
AST: 15 U/L (ref 15–41)
Albumin: 4.6 g/dL (ref 3.5–5.0)
Alkaline Phosphatase: 55 U/L (ref 38–126)
Anion gap: 9 (ref 5–15)
BUN: 24 mg/dL — ABNORMAL HIGH (ref 8–23)
CO2: 25 mmol/L (ref 22–32)
Calcium: 9.9 mg/dL (ref 8.9–10.3)
Chloride: 106 mmol/L (ref 98–111)
Creatinine, Ser: 1.78 mg/dL — ABNORMAL HIGH (ref 0.44–1.00)
GFR calc Af Amer: 33 mL/min — ABNORMAL LOW (ref >60)
GFR calc non Af Amer: 28 mL/min — ABNORMAL LOW (ref >60)
Glucose, Bld: 94 mg/dL (ref 70–99)
Potassium: 4.6 mmol/L (ref 3.5–5.1)
Sodium: 140 mmol/L (ref 135–145)
Total Bilirubin: 0.4 mg/dL (ref 0.3–1.2)
Total Protein: 7.3 g/dL (ref 6.5–8.1)
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
## 2019-03-10 NOTE — Patient Instructions (Signed)
[facility] at [facility] Discharge Instructions  You were seen today by Dr. Katragadda. He went over your recent lab results. He will see you back in 2 weeks for labs and follow up.   Thank you for choosing [facility] at [facility] to provide your oncology and hematology care.  To afford each patient quality time with our provider, please arrive at least 15 minutes before your scheduled appointment time.   If you have a lab appointment with the Cancer Center please come in thru the  Main Entrance and check in at the main information desk  You need to re-schedule your appointment should you arrive 10 or more minutes late.  We strive to give you quality time with our providers, and arriving late affects you and other patients whose appointments are after yours.  Also, if you no show three or more times for appointments you may be dismissed from the clinic at the providers discretion.     Again, thank you for choosing [facility].  Our hope is that these requests will decrease the amount of time that you wait before being seen by our physicians.       _____________________________________________________________  Should you have questions after your visit to [facility], please contact our office at [phone] between the hours of 8:00 a.m. and 4:30 p.m.  Voicemails left after 4:00 p.m. will not be returned until the following business day.  For prescription refill requests, have your pharmacy contact our office and allow 72 hours.    Cancer Center Support Programs:   > Cancer Support Group  2nd Tuesday of the month 1pm-2pm, Journey Room    

## 2019-03-10 NOTE — Assessment & Plan Note (Signed)
1.  IgG lambda plasma cell myeloma, stage II, standard risk: - Cycle 1 KCyD on 04/17/2018, cycle 2 KRD with extensive skin rash. - Cycle 3 and 4 completed on 08/21/2018 with low-dose pomalidomide 2 mg. -Auto stem cell transplant on 10/23/2018. -PET scan on 01/21/2019 shows multiple bone lesions but without any hypermetabolic activity. -Bone marrow biopsy on 01/21/2019 shows normocellular trilineage hematopoiesis with maturation, no increase in blasts.  No increase in plasma cells (less than 1% by CD138 IHC).  Normal cytogenetics and FISH negative for myeloma.  MRD results are pending from [facility]. -Myeloma labs on 02/12/2019 shows negative SPEP.  Free light chain ratio was 1.41 with kappa light chains 15.7.  Immunofixation was positive for IgG lambda monoclonal protein. - Maintenance therapy was recommended with pomalidomide 2 mg 3 weeks on 1 week off. - She took 3 days of pomalidomide and had dental infection.  She had to stop pomalidomide as she had some minor reactions with antibiotic.  Now she is off of antibiotics. -I have checked her CBC today which is back to her baseline.  I have told her to start back on pomalidomide 2 mg 3 weeks on 1 week off. -I will see her back in 2 weeks to repeat labs.  2.  Back pain: - This is from multiple myeloma.  She is taking hydrocodone 7.5 mg 4 times a day which is helping.  3.  Bone strengthening: - Because of her renal insufficiency, we started her on Xgeva rather than Zometa on 02/12/2019. -She will continue calcium and vitamin D supplements.  4.  ID prophylaxis: -She will continue acyclovir twice daily.

## 2019-03-11 MED ORDER — CYANOCOBALAMIN 1000 MCG/ML IJ SOLN
INTRAMUSCULAR | Status: AC
  Filled 2019-03-11: qty 1

## 2019-03-16 ENCOUNTER — Other Ambulatory Visit (HOSPITAL_COMMUNITY): Payer: Self-pay | Admitting: Hematology

## 2019-03-16 DIAGNOSIS — C9000 Multiple myeloma not having achieved remission: Secondary | ICD-10-CM

## 2019-03-17 ENCOUNTER — Encounter (HOSPITAL_COMMUNITY): Payer: Self-pay | Admitting: *Deleted

## 2019-03-17 ENCOUNTER — Other Ambulatory Visit (HOSPITAL_COMMUNITY): Payer: Medicaid Other

## 2019-03-17 ENCOUNTER — Ambulatory Visit (HOSPITAL_COMMUNITY): Payer: Medicaid Other

## 2019-03-17 ENCOUNTER — Ambulatory Visit (HOSPITAL_COMMUNITY): Payer: Medicaid Other | Admitting: Hematology

## 2019-03-17 ENCOUNTER — Other Ambulatory Visit (HOSPITAL_COMMUNITY): Payer: Self-pay | Admitting: Hematology

## 2019-03-17 DIAGNOSIS — C9000 Multiple myeloma not having achieved remission: Secondary | ICD-10-CM

## 2019-03-17 DIAGNOSIS — F419 Anxiety disorder, unspecified: Secondary | ICD-10-CM

## 2019-03-17 MED ORDER — ALPRAZOLAM 0.5 MG PO TABS: ORAL_TABLET | ORAL | 0 refills | Status: DC

## 2019-03-17 NOTE — Progress Notes (Signed)
Patient called the clinic today and asked that I call her back.  I called patient and she was solemn and very soft spoken.  She stated that she has been having some really bad memories that keep flooding her thoughts. She is constantly being reminded of her difficult childhood with her dad.  She said that she just keeps rehashing the memories and doesn't know why.  She reports that she is not sleeping well, the news is nothing but bad stuff and she can't go to church. She just doesn't have anything positive in her life right now. She feels like she is getting real depressed.  She reports taking an extra xanax on top of her three prescribed on most days. She states that she can feel it coming on and it feels like the bottom is going to fall out from underneath her .  She states that she just wants to feel better.  She asked if we could increase her Xanax strength.  I told her that I would forward that along to Dr. Delton Coombes.    I inquired about her appointment with the mental health doctor and she states that she had to cancel due to her transplant and hasn't had the time to reschedule since then. I encouraged her to seek out an appointment with them and get some help from a psychiatrist.  She verbalizes understanding of the need for that appointment and that she will call and get in to see them soon.    I asked Sury if she was suicidal and she reports that she is just depressed but not suicidal.    I reminded her that I am here if she ever needs anything or needs to talk.  I encouraged her again to get in with the psychiatrist.

## 2019-03-17 NOTE — Progress Notes (Signed)
I talked with patient and advised of the new dose of xanax that was called in to her pharmacy.  I instructed her on how to take it.  She is to also get an appointment with the psychiatrist soon to help her further.  She verbalizes understanding and appreciation.

## 2019-03-25 ENCOUNTER — Encounter (HOSPITAL_COMMUNITY): Payer: Self-pay | Admitting: Hematology

## 2019-03-25 ENCOUNTER — Inpatient Hospital Stay (HOSPITAL_COMMUNITY): Payer: Medicare Other | Attending: Hematology

## 2019-03-25 ENCOUNTER — Encounter (HOSPITAL_COMMUNITY): Payer: Self-pay | Admitting: Lab

## 2019-03-25 ENCOUNTER — Other Ambulatory Visit: Payer: Self-pay

## 2019-03-25 ENCOUNTER — Inpatient Hospital Stay (HOSPITAL_BASED_OUTPATIENT_CLINIC_OR_DEPARTMENT_OTHER): Payer: Medicare Other | Admitting: Hematology

## 2019-03-25 ENCOUNTER — Inpatient Hospital Stay (HOSPITAL_COMMUNITY): Payer: Medicare Other

## 2019-03-25 VITALS — BP 155/59 | HR 58 | Temp 97.5°F | Resp 18 | Wt 159.0 lb

## 2019-03-25 DIAGNOSIS — C9000 Multiple myeloma not having achieved remission: Secondary | ICD-10-CM | POA: Diagnosis present

## 2019-03-25 DIAGNOSIS — F419 Anxiety disorder, unspecified: Secondary | ICD-10-CM | POA: Diagnosis not present

## 2019-03-25 LAB — COMPREHENSIVE METABOLIC PANEL
ALT: 13 U/L (ref 0–44)
AST: 17 U/L (ref 15–41)
Albumin: 4.4 g/dL (ref 3.5–5.0)
Alkaline Phosphatase: 47 U/L (ref 38–126)
Anion gap: 10 (ref 5–15)
BUN: 31 mg/dL — ABNORMAL HIGH (ref 8–23)
CO2: 27 mmol/L (ref 22–32)
Calcium: 9 mg/dL (ref 8.9–10.3)
Chloride: 103 mmol/L (ref 98–111)
Creatinine, Ser: 1.98 mg/dL — ABNORMAL HIGH (ref 0.44–1.00)
GFR calc Af Amer: 29 mL/min — ABNORMAL LOW (ref >60)
GFR calc non Af Amer: 25 mL/min — ABNORMAL LOW (ref >60)
Glucose, Bld: 115 mg/dL — ABNORMAL HIGH (ref 70–99)
Potassium: 4.3 mmol/L (ref 3.5–5.1)
Sodium: 140 mmol/L (ref 135–145)
Total Bilirubin: 0.6 mg/dL (ref 0.3–1.2)
Total Protein: 7 g/dL (ref 6.5–8.1)

## 2019-03-25 LAB — CBC WITH DIFFERENTIAL/PLATELET
Abs Immature Granulocytes: 0.01 10*3/uL (ref 0.00–0.07)
Basophils Absolute: 0.1 10*3/uL (ref 0.0–0.1)
Basophils Relative: 1 %
Eosinophils Absolute: 0.9 10*3/uL — ABNORMAL HIGH (ref 0.0–0.5)
Eosinophils Relative: 23 %
HCT: 30 % — ABNORMAL LOW (ref 36.0–46.0)
Hemoglobin: 9.4 g/dL — ABNORMAL LOW (ref 12.0–15.0)
Immature Granulocytes: 0 %
Lymphocytes Relative: 21 %
Lymphs Abs: 0.9 10*3/uL (ref 0.7–4.0)
MCH: 32.6 pg (ref 26.0–34.0)
MCHC: 31.3 g/dL (ref 30.0–36.0)
MCV: 104.2 fL — ABNORMAL HIGH (ref 80.0–100.0)
Monocytes Absolute: 0.6 10*3/uL (ref 0.1–1.0)
Monocytes Relative: 15 %
Neutro Abs: 1.6 10*3/uL — ABNORMAL LOW (ref 1.7–7.7)
Neutrophils Relative %: 40 %
Platelets: 138 10*3/uL — ABNORMAL LOW (ref 150–400)
RBC: 2.88 MIL/uL — ABNORMAL LOW (ref 3.87–5.11)
RDW: 12 % (ref 11.5–15.5)
WBC: 4.1 10*3/uL (ref 4.0–10.5)
nRBC: 0 % (ref 0.0–0.2)

## 2019-03-25 LAB — SAMPLE TO BLOOD BANK

## 2019-03-25 LAB — MAGNESIUM: Magnesium: 2 mg/dL (ref 1.7–2.4)

## 2019-03-25 MED ORDER — DENOSUMAB 120 MG/1.7ML ~~LOC~~ SOLN
SUBCUTANEOUS | Status: AC
  Filled 2019-03-25: qty 1.7
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
## 2019-03-25 NOTE — Assessment & Plan Note (Signed)
1.  IgG lambda plasma cell myeloma, stage II, standard risk: - Cycle 1 KCyD on 04/17/2018, cycle 2 KRD with extensive skin rash. - Cycle 3 and 4 completed on 08/21/2018 with low-dose pomalidomide 2 mg. -Auto stem cell transplant on 10/23/2018. -PET scan on 01/21/2019 shows multiple bone lesions but without any hypermetabolic activity. -Bone marrow biopsy on 01/21/2019 shows normocellular trilineage hematopoiesis with maturation, no increase in blasts.  No increase in plasma cells (less than 1% by CD138 IHC).  Normal cytogenetics and FISH negative for myeloma.  MRD results are pending from [facility]. -Myeloma labs on 02/12/2019 shows negative SPEP.  Free light chain ratio was 1.41 with kappa light chains 15.7.  Immunofixation was positive for IgG lambda monoclonal protein. - Maintenance therapy with pomalidomide 2 mg 3 weeks on 1 week off started on 03/10/2019. -She has tolerated it very well.  She will take her last pill tomorrow.  She will start back next cycle on 04/03/2019. - She reportedly has appointment [facility] on 04/22/2019. -I will see her back in 3 weeks with repeat labs.  She has a skin lesion at the right angle of the eye.  She reportedly noticed that it has grown after transplant.  I have recommended her to see dermatology.  2.  Back pain: - This is from multiple myeloma.  She is taking hydrocodone 7.5 mg 4 times a day which is helping.  3.  Bone strengthening: -Because of her renal insufficiency, we started her on Xgeva on 02/12/2019. -She is wanting to see her dentist for extraction.  I will hold her denosumab until she has all the dental work done.  4.  Anxiety: -This has improved after we increased the Xanax.  She is currently taking Xanax 0.5 mg twice daily.

## 2019-03-25 NOTE — Patient Instructions (Signed)
[facility] at [facility] Discharge Instructions  You were seen today by Dr. Katragadda. He went over your recent results. He will see you back in 3 weeks for labs and follow up.   Thank you for choosing [facility] at [facility] to provide your oncology and hematology care.  To afford each patient quality time with our provider, please arrive at least 15 minutes before your scheduled appointment time.   If you have a lab appointment with the Cancer Center please come in thru the  Main Entrance and check in at the main information desk  You need to re-schedule your appointment should you arrive 10 or more minutes late.  We strive to give you quality time with our providers, and arriving late affects you and other patients whose appointments are after yours.  Also, if you no show three or more times for appointments you may be dismissed from the clinic at the providers discretion.     Again, thank you for choosing [facility].  Our hope is that these requests will decrease the amount of time that you wait before being seen by our physicians.       _____________________________________________________________  Should you have questions after your visit to  Cancer Center, please contact our office at [phone] between the hours of 8:00 a.m. and 4:30 p.m.  Voicemails left after 4:00 p.m. will not be returned until the following business day.  For prescription refill requests, have your pharmacy contact our office and allow 72 hours.    Cancer Center Support Programs:   > Cancer Support Group  2nd Tuesday of the month 1pm-2pm, Journey Room    

## 2019-03-25 NOTE — Progress Notes (Unsigned)
Referral for Dermatology Dr Denna Haggard, appt 9/16 @ 10 pt aware

## 2019-03-25 NOTE — Progress Notes (Signed)
1155 lab results reviewed with Dr. Delton Coombes including Creatinine of 1.98. Per MD, hold Delton See today due to possible dental work soon.

## 2019-04-01 ENCOUNTER — Other Ambulatory Visit (HOSPITAL_COMMUNITY): Payer: Self-pay | Admitting: *Deleted

## 2019-04-02 ENCOUNTER — Other Ambulatory Visit (HOSPITAL_COMMUNITY): Payer: Self-pay | Admitting: *Deleted

## 2019-04-02 MED ORDER — HYDROCODONE-ACETAMINOPHEN 7.5-325 MG PO TABS: 1.0000 | ORAL_TABLET | Freq: Four times a day (QID) | ORAL | 0 refills | Status: DC | PRN

## 2019-04-06 ENCOUNTER — Encounter (HOSPITAL_COMMUNITY): Payer: Self-pay | Admitting: *Deleted

## 2019-04-06 ENCOUNTER — Other Ambulatory Visit (HOSPITAL_COMMUNITY): Payer: Self-pay | Admitting: *Deleted

## 2019-04-06 DIAGNOSIS — F32A Depression, unspecified: Secondary | ICD-10-CM

## 2019-04-06 DIAGNOSIS — F329 Major depressive disorder, single episode, unspecified: Secondary | ICD-10-CM

## 2019-04-06 MED ORDER — CITALOPRAM HYDROBROMIDE 40 MG PO TABS: 40.0000 mg | ORAL_TABLET | Freq: Every day | ORAL | 1 refills | Status: DC

## 2019-04-06 MED ORDER — HYDROCODONE-ACETAMINOPHEN 7.5-325 MG PO TABS: 1.0000 | ORAL_TABLET | Freq: Four times a day (QID) | ORAL | 0 refills | Status: DC | PRN

## 2019-04-06 NOTE — Progress Notes (Signed)
Patient called the clinic today. She is scheduled to see a dermatologist about the skin tag on her face the end of September. She is concerned about it and wants to know if she can get into the dermatologist sooner.  I advised her to call the dermatology office and ask to be put on a cancellation list so that her appt can be moved up if they have any openings.  She verbalizes understanding.

## 2019-04-07 ENCOUNTER — Encounter (HOSPITAL_COMMUNITY): Payer: Self-pay | Admitting: *Deleted

## 2019-04-07 NOTE — Progress Notes (Signed)
I talked with [facility] today at patient's request to let them know what antibiotic we called in for her when she had the reaction to augmentin.  I advised Vicente Males at the office of the zithromax that was called in by Harriet Pho, NP.  She verbalizes understanding and will let the dentist know.  Patient is aware.

## 2019-04-08 ENCOUNTER — Other Ambulatory Visit (HOSPITAL_COMMUNITY): Payer: Self-pay | Admitting: *Deleted

## 2019-04-08 DIAGNOSIS — G479 Sleep disorder, unspecified: Secondary | ICD-10-CM

## 2019-04-08 MED ORDER — ZOLPIDEM TARTRATE 10 MG PO TABS: 10.0000 mg | ORAL_TABLET | Freq: Every evening | ORAL | 0 refills | Status: DC | PRN

## 2019-04-13 ENCOUNTER — Other Ambulatory Visit (HOSPITAL_COMMUNITY): Payer: Self-pay | Admitting: *Deleted

## 2019-04-14 ENCOUNTER — Other Ambulatory Visit (HOSPITAL_COMMUNITY): Payer: Self-pay | Admitting: *Deleted

## 2019-04-15 ENCOUNTER — Ambulatory Visit (HOSPITAL_COMMUNITY): Payer: Medicare Other | Admitting: Hematology

## 2019-04-15 ENCOUNTER — Other Ambulatory Visit (HOSPITAL_COMMUNITY): Payer: Medicare Other

## 2019-04-20 ENCOUNTER — Other Ambulatory Visit (HOSPITAL_COMMUNITY): Payer: Self-pay | Admitting: Hematology

## 2019-04-20 DIAGNOSIS — C9000 Multiple myeloma not having achieved remission: Secondary | ICD-10-CM

## 2019-04-21 ENCOUNTER — Other Ambulatory Visit (HOSPITAL_COMMUNITY): Payer: Self-pay | Admitting: *Deleted

## 2019-04-21 DIAGNOSIS — C9000 Multiple myeloma not having achieved remission: Secondary | ICD-10-CM

## 2019-04-21 DIAGNOSIS — F419 Anxiety disorder, unspecified: Secondary | ICD-10-CM

## 2019-04-21 MED ORDER — ALPRAZOLAM 0.5 MG PO TABS: ORAL_TABLET | ORAL | 3 refills | Status: DC

## 2019-04-22 ENCOUNTER — Ambulatory Visit (HOSPITAL_COMMUNITY): Payer: Medicare Other

## 2019-04-22 ENCOUNTER — Other Ambulatory Visit (HOSPITAL_COMMUNITY): Payer: Medicare Other

## 2019-04-23 ENCOUNTER — Inpatient Hospital Stay (HOSPITAL_COMMUNITY): Payer: Medicare Other

## 2019-04-23 ENCOUNTER — Other Ambulatory Visit: Payer: Self-pay

## 2019-04-23 ENCOUNTER — Encounter (HOSPITAL_COMMUNITY): Payer: Self-pay | Admitting: Hematology

## 2019-04-23 ENCOUNTER — Inpatient Hospital Stay (HOSPITAL_COMMUNITY): Payer: Medicare Other | Attending: Hematology | Admitting: Hematology

## 2019-04-23 VITALS — BP 144/55 | HR 60 | Temp 98.7°F | Resp 14 | Wt 163.5 lb

## 2019-04-23 DIAGNOSIS — C9000 Multiple myeloma not having achieved remission: Secondary | ICD-10-CM | POA: Diagnosis present

## 2019-04-23 DIAGNOSIS — F419 Anxiety disorder, unspecified: Secondary | ICD-10-CM | POA: Insufficient documentation

## 2019-04-23 DIAGNOSIS — G893 Neoplasm related pain (acute) (chronic): Secondary | ICD-10-CM | POA: Diagnosis not present

## 2019-04-23 NOTE — Progress Notes (Signed)
Alexandra Price See to be held today d/t recent dental work 2 weeks ago and more dental work upcoming on 9/10. Pt in room for MD visit as well today.

## 2019-04-23 NOTE — Patient Instructions (Addendum)
[facility] at [facility] Discharge Instructions  You were seen today by Dr. Katragadda. He went over your recent lab results. He will see you back in 4 weeks for labs and follow up.   Thank you for choosing [facility] at [facility] to provide your oncology and hematology care.  To afford each patient quality time with our provider, please arrive at least 15 minutes before your scheduled appointment time.   If you have a lab appointment with the Cancer Center please come in thru the  Main Entrance and check in at the main information desk  You need to re-schedule your appointment should you arrive 10 or more minutes late.  We strive to give you quality time with our providers, and arriving late affects you and other patients whose appointments are after yours.  Also, if you no show three or more times for appointments you may be dismissed from the clinic at the providers discretion.     Again, thank you for choosing  Cancer Center.  Our hope is that these requests will decrease the amount of time that you wait before being seen by our physicians.       _____________________________________________________________  Should you have questions after your visit to  Cancer Center, please contact our office at [phone] between the hours of 8:00 a.m. and 4:30 p.m.  Voicemails left after 4:00 p.m. will not be returned until the following business day.  For prescription refill requests, have your pharmacy contact our office and allow 72 hours.    Cancer Center Support Programs:   > Cancer Support Group  2nd Tuesday of the month 1pm-2pm, Journey Room    
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
## 2019-04-23 NOTE — Assessment & Plan Note (Signed)
1.  IgG lambda plasma cell myeloma, stage II, standard risk: - Cycle 1 KCyD on 04/17/2018, cycle 2 KRD with extensive skin rash. - Cycle 3 and 4 completed on 08/21/2018 with low-dose pomalidomide 2 mg. -Auto stem cell transplant on 10/23/2018. -PET scan on 01/21/2019 shows multiple bone lesions but without any hypermetabolic activity. -Bone marrow biopsy on 01/21/2019 shows normocellular trilineage hematopoiesis with maturation, no increase in blasts.  No increase in plasma cells (less than 1% by CD138 IHC).  Normal cytogenetics and FISH negative for myeloma.  MRD results are pending from [facility]. -Myeloma labs on 02/12/2019 shows negative SPEP.  Free light chain ratio was 1.41 with kappa light chains 15.7.  Immunofixation was positive for IgG lambda monoclonal protein. - Maintenance therapy with pomalidomide 2 mg 3 weeks on 1 week off started on 03/10/2019. - She is tolerating pomalidomide very well.  She will take her last pill today. -She was evaluated by Dr. Norma Fredrickson on 04/22/2019 and for vaccines were given.  Multiple myeloma panel was drawn. -I reviewed routine labs from [facility].  CBC showed white count of 4.8, hemoglobin 9.1, platelet count 102 with 35% neutrophils and 23% eosinophils and 16% monocytes and 24% lymphocytes.  Creatinine was 1.6. - We will closely monitor her eosinophil percentage.  She will start her next cycle next week. - She will be seen back in 4 weeks for follow-up.  We will follow-up on myeloma panel from [facility].  2.  Back pain: - This is from multiple myeloma.  She is taking hydrocodone 7.5 mg 4 times a day which is helping.  3.  Bone strengthening: -Because of her renal insufficiency, we started her on Xgeva on 02/12/2019. -She is continuing to have dental work.  We are holding denosumab.  4.  Anxiety: -This is well controlled on Xanax 0.5 mg twice daily.

## 2019-04-30 ENCOUNTER — Other Ambulatory Visit (HOSPITAL_COMMUNITY): Payer: Self-pay | Admitting: *Deleted

## 2019-04-30 MED ORDER — HYDROCODONE-ACETAMINOPHEN 7.5-325 MG PO TABS: 1.0000 | ORAL_TABLET | Freq: Four times a day (QID) | ORAL | 0 refills | Status: DC | PRN

## 2019-05-01 ENCOUNTER — Other Ambulatory Visit (HOSPITAL_COMMUNITY): Payer: Self-pay | Admitting: Surgery

## 2019-05-06 ENCOUNTER — Other Ambulatory Visit: Payer: Self-pay | Admitting: Dermatology

## 2019-05-07 ENCOUNTER — Other Ambulatory Visit (HOSPITAL_COMMUNITY): Payer: Self-pay | Admitting: *Deleted

## 2019-05-07 DIAGNOSIS — G479 Sleep disorder, unspecified: Secondary | ICD-10-CM

## 2019-05-07 MED ORDER — ZOLPIDEM TARTRATE 10 MG PO TABS: 10.0000 mg | ORAL_TABLET | Freq: Every evening | ORAL | 0 refills | Status: DC | PRN

## 2019-05-18 ENCOUNTER — Other Ambulatory Visit (HOSPITAL_COMMUNITY): Payer: Self-pay | Admitting: *Deleted

## 2019-05-18 DIAGNOSIS — C9000 Multiple myeloma not having achieved remission: Secondary | ICD-10-CM

## 2019-05-18 DIAGNOSIS — R42 Dizziness and giddiness: Secondary | ICD-10-CM

## 2019-05-18 MED ORDER — THERA PO TABS: 1.0000 | ORAL_TABLET | Freq: Every day | ORAL | 5 refills | Status: DC

## 2019-05-18 MED ORDER — PANTOPRAZOLE SODIUM 40 MG PO TBEC: 40.0000 mg | DELAYED_RELEASE_TABLET | Freq: Every day | ORAL | 2 refills | Status: DC

## 2019-05-19 ENCOUNTER — Other Ambulatory Visit (HOSPITAL_COMMUNITY): Payer: Self-pay | Admitting: *Deleted

## 2019-05-19 ENCOUNTER — Other Ambulatory Visit (HOSPITAL_COMMUNITY): Payer: Self-pay | Admitting: Hematology

## 2019-05-19 DIAGNOSIS — C9000 Multiple myeloma not having achieved remission: Secondary | ICD-10-CM

## 2019-05-19 MED ORDER — POMALIDOMIDE 2 MG PO CAPS: ORAL_CAPSULE | ORAL | 1 refills | Status: DC

## 2019-05-21 ENCOUNTER — Inpatient Hospital Stay (HOSPITAL_COMMUNITY): Payer: Medicare Other

## 2019-05-21 ENCOUNTER — Inpatient Hospital Stay (HOSPITAL_BASED_OUTPATIENT_CLINIC_OR_DEPARTMENT_OTHER): Payer: Medicare Other | Admitting: Hematology

## 2019-05-21 ENCOUNTER — Inpatient Hospital Stay (HOSPITAL_COMMUNITY): Payer: Medicare Other | Attending: Hematology

## 2019-05-21 ENCOUNTER — Other Ambulatory Visit: Payer: Self-pay

## 2019-05-21 ENCOUNTER — Ambulatory Visit (HOSPITAL_COMMUNITY): Payer: Medicare Other

## 2019-05-21 ENCOUNTER — Other Ambulatory Visit (HOSPITAL_COMMUNITY): Payer: Medicare Other

## 2019-05-21 ENCOUNTER — Encounter (HOSPITAL_COMMUNITY): Payer: Self-pay | Admitting: Hematology

## 2019-05-21 VITALS — BP 151/60 | HR 60 | Temp 98.0°F | Resp 14 | Wt 165.0 lb

## 2019-05-21 DIAGNOSIS — F419 Anxiety disorder, unspecified: Secondary | ICD-10-CM | POA: Insufficient documentation

## 2019-05-21 DIAGNOSIS — Z23 Encounter for immunization: Secondary | ICD-10-CM | POA: Diagnosis not present

## 2019-05-21 DIAGNOSIS — C9000 Multiple myeloma not having achieved remission: Secondary | ICD-10-CM

## 2019-05-21 DIAGNOSIS — Z79899 Other long term (current) drug therapy: Secondary | ICD-10-CM | POA: Diagnosis not present

## 2019-05-21 LAB — VITAMIN B12: Vitamin B-12: 392 pg/mL (ref 180–914)

## 2019-05-21 LAB — CBC WITH DIFFERENTIAL/PLATELET
Abs Immature Granulocytes: 0.01 10*3/uL (ref 0.00–0.07)
Basophils Absolute: 0.1 10*3/uL (ref 0.0–0.1)
Basophils Relative: 2 %
Eosinophils Absolute: 0.5 10*3/uL (ref 0.0–0.5)
Eosinophils Relative: 14 %
HCT: 29 % — ABNORMAL LOW (ref 36.0–46.0)
Hemoglobin: 9 g/dL — ABNORMAL LOW (ref 12.0–15.0)
Immature Granulocytes: 0 %
Lymphocytes Relative: 25 %
Lymphs Abs: 0.8 10*3/uL (ref 0.7–4.0)
MCH: 32.7 pg (ref 26.0–34.0)
MCHC: 31 g/dL (ref 30.0–36.0)
MCV: 105.5 fL — ABNORMAL HIGH (ref 80.0–100.0)
Monocytes Absolute: 0.5 10*3/uL (ref 0.1–1.0)
Monocytes Relative: 15 %
Neutro Abs: 1.5 10*3/uL — ABNORMAL LOW (ref 1.7–7.7)
Neutrophils Relative %: 44 %
Platelets: 112 10*3/uL — ABNORMAL LOW (ref 150–400)
RBC: 2.75 MIL/uL — ABNORMAL LOW (ref 3.87–5.11)
RDW: 13.1 % (ref 11.5–15.5)
WBC: 3.3 10*3/uL — ABNORMAL LOW (ref 4.0–10.5)
nRBC: 0 % (ref 0.0–0.2)

## 2019-05-21 LAB — COMPREHENSIVE METABOLIC PANEL
ALT: 14 U/L (ref 0–44)
AST: 18 U/L (ref 15–41)
Albumin: 4.3 g/dL (ref 3.5–5.0)
Alkaline Phosphatase: 53 U/L (ref 38–126)
Anion gap: 10 (ref 5–15)
BUN: 32 mg/dL — ABNORMAL HIGH (ref 8–23)
CO2: 27 mmol/L (ref 22–32)
Calcium: 9.7 mg/dL (ref 8.9–10.3)
Chloride: 103 mmol/L (ref 98–111)
Creatinine, Ser: 1.9 mg/dL — ABNORMAL HIGH (ref 0.44–1.00)
GFR calc Af Amer: 30 mL/min — ABNORMAL LOW (ref >60)
GFR calc non Af Amer: 26 mL/min — ABNORMAL LOW (ref >60)
Glucose, Bld: 127 mg/dL — ABNORMAL HIGH (ref 70–99)
Potassium: 4.3 mmol/L (ref 3.5–5.1)
Sodium: 140 mmol/L (ref 135–145)
Total Bilirubin: 0.5 mg/dL (ref 0.3–1.2)
Total Protein: 6.9 g/dL (ref 6.5–8.1)

## 2019-05-21 LAB — FOLATE: Folate: 58 ng/mL (ref >5.9)

## 2019-05-21 LAB — FERRITIN: Ferritin: 221 ng/mL (ref 11–307)

## 2019-05-21 LAB — IRON AND TIBC
Iron: 51 ug/dL (ref 28–170)
Saturation Ratios: 16 % (ref 10.4–31.8)
TIBC: 312 ug/dL (ref 250–450)
UIBC: 261 ug/dL

## 2019-05-21 MED ORDER — INFLUENZA VAC A&B SA ADJ QUAD 0.5 ML IM PRSY
0.5000 mL | PREFILLED_SYRINGE | Freq: Once | INTRAMUSCULAR | Status: AC
  Administered 2019-05-21: 0.5 mL via INTRAMUSCULAR

## 2019-05-21 MED ORDER — INFLUENZA VAC A&B SA ADJ QUAD 0.5 ML IM PRSY
PREFILLED_SYRINGE | INTRAMUSCULAR | Status: AC
  Filled 2019-05-21: qty 0.5
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
## 2019-05-21 NOTE — Assessment & Plan Note (Signed)
1.  IgG lambda plasma cell myeloma, stage II, standard risk: - Cycle 1 KCyD on 04/17/2018, cycle 2 KRD with extensive skin rash. - Cycle 3 and 4 completed on 08/21/2018 with low-dose pomalidomide 2 mg. -Auto stem cell transplant on 10/23/2018. -PET scan on 01/21/2019 shows multiple bone lesions but without any hypermetabolic activity. -Bone marrow biopsy on 01/21/2019 shows normocellular trilineage hematopoiesis with maturation, no increase in blasts.  No increase in plasma cells (less than 1% by CD138 IHC).  Normal cytogenetics and FISH negative for myeloma.  MRD results are pending from [facility]. -Myeloma labs on 02/12/2019 shows negative SPEP.  Free light chain ratio was 1.41 with kappa light chains 15.7.  Immunofixation was positive for IgG lambda monoclonal protein. - Maintenance therapy with pomalidomide 2 mg 3 weeks on 1 week off started on 03/10/2019. - She was evaluated on 04/22/2019 at [facility].  Free kappa light chains were slightly elevated at 19.72.  Lambda light chains are 16.88.  Ratio was normal at 1.17. - It was recommended that we check myeloma panel once a month.  She will continue maintenance pomalidomide as long as light chain ratio stays below 10. As she is tolerating pomalidomide very well.  She will take her last pill tomorrow and then have 1 week off. -We will see her back in 4 weeks for follow-up. -She has an appointment with dermatology for resection of temporal skin lesion which was biopsied and found to be squamous cell carcinoma in situ.  2.  Back pain: - This is from multiple myeloma.  She is taking hydrocodone 7.5 mg 4 times a day which is helping.  3.  Bone strengthening: -Because of renal insufficiency, we started her on Xgeva on 02/12/2019. -She is still having dental work.  We are holding it.  4.  Anxiety: -She will continue Xanax 0.5 mg twice daily.

## 2019-05-21 NOTE — Patient Instructions (Signed)
[facility] at [facility] Discharge Instructions  You were seen today by Dr. Katragadda. He went over your recent lab results. He will see you back in 1 month for labs and follow up.   Thank you for choosing  Cancer Center at [facility] to provide your oncology and hematology care.  To afford each patient quality time with our provider, please arrive at least 15 minutes before your scheduled appointment time.   If you have a lab appointment with the Cancer Center please come in thru the  Main Entrance and check in at the main information desk  You need to re-schedule your appointment should you arrive 10 or more minutes late.  We strive to give you quality time with our providers, and arriving late affects you and other patients whose appointments are after yours.  Also, if you no show three or more times for appointments you may be dismissed from the clinic at the providers discretion.     Again, thank you for choosing [facility].  Our hope is that these requests will decrease the amount of time that you wait before being seen by our physicians.       _____________________________________________________________  Should you have questions after your visit to [facility], please contact our office at [phone] between the hours of 8:00 a.m. and 4:30 p.m.  Voicemails left after 4:00 p.m. will not be returned until the following business day.  For prescription refill requests, have your pharmacy contact our office and allow 72 hours.    Cancer Center Support Programs:   > Cancer Support Group  2nd Tuesday of the month 1pm-2pm, Journey Room    

## 2019-05-21 NOTE — Progress Notes (Signed)
Alexandra Price presents today for injection per MD orders. Fluad administered IM in left deltoid. Administration without incident. Patient tolerated well.

## 2019-05-27 ENCOUNTER — Other Ambulatory Visit (HOSPITAL_COMMUNITY): Payer: Self-pay | Admitting: *Deleted

## 2019-05-27 MED ORDER — HYDROCODONE-ACETAMINOPHEN 7.5-325 MG PO TABS: 1.0000 | ORAL_TABLET | Freq: Four times a day (QID) | ORAL | 0 refills | Status: DC | PRN

## 2019-06-04 ENCOUNTER — Other Ambulatory Visit (HOSPITAL_COMMUNITY): Payer: Self-pay | Admitting: *Deleted

## 2019-06-04 DIAGNOSIS — G479 Sleep disorder, unspecified: Secondary | ICD-10-CM

## 2019-06-04 MED ORDER — ZOLPIDEM TARTRATE 10 MG PO TABS: 10.0000 mg | ORAL_TABLET | Freq: Every evening | ORAL | 2 refills | Status: DC | PRN

## 2019-06-11 ENCOUNTER — Other Ambulatory Visit (HOSPITAL_COMMUNITY): Payer: Self-pay | Admitting: Hematology

## 2019-06-11 DIAGNOSIS — C9000 Multiple myeloma not having achieved remission: Secondary | ICD-10-CM

## 2019-06-17 ENCOUNTER — Other Ambulatory Visit: Payer: Self-pay

## 2019-06-18 ENCOUNTER — Inpatient Hospital Stay (HOSPITAL_COMMUNITY): Payer: Medicare Other

## 2019-06-18 ENCOUNTER — Encounter (HOSPITAL_COMMUNITY): Payer: Self-pay | Admitting: Hematology

## 2019-06-18 ENCOUNTER — Inpatient Hospital Stay (HOSPITAL_BASED_OUTPATIENT_CLINIC_OR_DEPARTMENT_OTHER): Payer: Medicare Other | Admitting: Hematology

## 2019-06-18 DIAGNOSIS — C9000 Multiple myeloma not having achieved remission: Secondary | ICD-10-CM

## 2019-06-18 LAB — COMPREHENSIVE METABOLIC PANEL
ALT: 14 U/L (ref 0–44)
AST: 19 U/L (ref 15–41)
Albumin: 4.3 g/dL (ref 3.5–5.0)
Alkaline Phosphatase: 48 U/L (ref 38–126)
Anion gap: 6 (ref 5–15)
BUN: 29 mg/dL — ABNORMAL HIGH (ref 8–23)
CO2: 28 mmol/L (ref 22–32)
Calcium: 9.1 mg/dL (ref 8.9–10.3)
Chloride: 107 mmol/L (ref 98–111)
Creatinine, Ser: 1.86 mg/dL — ABNORMAL HIGH (ref 0.44–1.00)
GFR calc Af Amer: 31 mL/min — ABNORMAL LOW (ref >60)
GFR calc non Af Amer: 27 mL/min — ABNORMAL LOW (ref >60)
Glucose, Bld: 81 mg/dL (ref 70–99)
Potassium: 4.5 mmol/L (ref 3.5–5.1)
Sodium: 141 mmol/L (ref 135–145)
Total Bilirubin: 0.5 mg/dL (ref 0.3–1.2)
Total Protein: 7.2 g/dL (ref 6.5–8.1)

## 2019-06-18 LAB — CBC WITH DIFFERENTIAL/PLATELET
Abs Immature Granulocytes: 0.01 10*3/uL (ref 0.00–0.07)
Basophils Absolute: 0.1 10*3/uL (ref 0.0–0.1)
Basophils Relative: 2 %
Eosinophils Absolute: 0.6 10*3/uL — ABNORMAL HIGH (ref 0.0–0.5)
Eosinophils Relative: 15 %
HCT: 29.8 % — ABNORMAL LOW (ref 36.0–46.0)
Hemoglobin: 9.3 g/dL — ABNORMAL LOW (ref 12.0–15.0)
Immature Granulocytes: 0 %
Lymphocytes Relative: 33 %
Lymphs Abs: 1.3 10*3/uL (ref 0.7–4.0)
MCH: 32.7 pg (ref 26.0–34.0)
MCHC: 31.2 g/dL (ref 30.0–36.0)
MCV: 104.9 fL — ABNORMAL HIGH (ref 80.0–100.0)
Monocytes Absolute: 0.6 10*3/uL (ref 0.1–1.0)
Monocytes Relative: 16 %
Neutro Abs: 1.3 10*3/uL — ABNORMAL LOW (ref 1.7–7.7)
Neutrophils Relative %: 34 %
Platelets: 110 10*3/uL — ABNORMAL LOW (ref 150–400)
RBC: 2.84 MIL/uL — ABNORMAL LOW (ref 3.87–5.11)
RDW: 13 % (ref 11.5–15.5)
WBC: 3.8 10*3/uL — ABNORMAL LOW (ref 4.0–10.5)
nRBC: 0 % (ref 0.0–0.2)

## 2019-06-18 LAB — LACTATE DEHYDROGENASE: LDH: 136 U/L (ref 98–192)

## 2019-06-18 NOTE — Assessment & Plan Note (Signed)
1.  IgG lambda plasma cell myeloma, stage II, standard risk: - Cycle 1 KCyD on 04/17/2018, cycle 2 KRD with extensive skin rash. - Cycle 3 and 4 completed on 08/21/2018 with low-dose pomalidomide 2 mg. -Auto stem cell transplant on 10/23/2018. -PET scan on 01/21/2019 shows multiple bone lesions but without any hypermetabolic activity. -Bone marrow biopsy on 01/21/2019 shows normocellular trilineage hematopoiesis with maturation, no increase in blasts.  No increase in plasma cells (less than 1% by CD138 IHC).  Normal cytogenetics and FISH negative for myeloma.  MRD results are pending from [facility]. -Myeloma labs on 02/12/2019 shows negative SPEP.  Free light chain ratio was 1.41 with kappa light chains 15.7.  Immunofixation was positive for IgG lambda monoclonal protein. - Maintenance therapy with pomalidomide 2 mg 3 weeks on 1 week off started on 03/10/2019. - She was evaluated on 04/22/2019 at [facility].  Free kappa light chains were slightly elevated at 19.72.  Lambda light chains are 16.88.  Ratio was normal at 1.17. -She is tolerating pomalidomide very well.  She will take for 2 more days and will have 1 week off. -I have reviewed labs from today.  She has mild leukopenia, thrombocytopenia and anemia from pomalidomide. -I will follow up on the myeloma panel from today.  We will see her back in 4 weeks for follow-up.  We will monitor her myeloma panel monthly.   2.  Back pain: -She will continue hydrocodone 7.5 mg as needed.  3.  Bone strengthening: -Because of renal insufficiency, we started her on Xgeva on 02/12/2019. -She had dental extractions done.  We are holding Xgeva at this time.  4.  Anxiety: -She will continue Xanax 0.5 mg twice daily.

## 2019-06-18 NOTE — Patient Instructions (Signed)
[facility] at [facility] Discharge Instructions  You were seen today by Dr. Katragadda. He went over your recent results. He will see you back in for labs and follow up.   Thank you for choosing [facility] at [facility] to provide your oncology and hematology care.  To afford each patient quality time with our provider, please arrive at least 15 minutes before your scheduled appointment time.   If you have a lab appointment with the Cancer Center please come in thru the  Main Entrance and check in at the main information desk  You need to re-schedule your appointment should you arrive 10 or more minutes late.  We strive to give you quality time with our providers, and arriving late affects you and other patients whose appointments are after yours.  Also, if you no show three or more times for appointments you may be dismissed from the clinic at the providers discretion.     Again, thank you for choosing [facility].  Our hope is that these requests will decrease the amount of time that you wait before being seen by our physicians.       _____________________________________________________________  Should you have questions after your visit to [facility], please contact our office at [phone] between the hours of 8:00 a.m. and 4:30 p.m.  Voicemails left after 4:00 p.m. will not be returned until the following business day.  For prescription refill requests, have your pharmacy contact our office and allow 72 hours.    Cancer Center Support Programs:   > Cancer Support Group  2nd Tuesday of the month 1pm-2pm, Journey Room    
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
## 2019-06-19 LAB — PROTEIN ELECTROPHORESIS, SERUM
A/G Ratio: 1.2 (ref 0.7–1.7)
Albumin ELP: 3.5 g/dL (ref 2.9–4.4)
Alpha-1-Globulin: 0.3 g/dL (ref 0.0–0.4)
Alpha-2-Globulin: 0.8 g/dL (ref 0.4–1.0)
Beta Globulin: 0.8 g/dL (ref 0.7–1.3)
Gamma Globulin: 1 g/dL (ref 0.4–1.8)
Globulin, Total: 2.9 g/dL (ref 2.2–3.9)
M-Spike, %: 0.4 g/dL — ABNORMAL HIGH
Total Protein ELP: 6.4 g/dL (ref 6.0–8.5)

## 2019-06-19 LAB — KAPPA/LAMBDA LIGHT CHAINS
Kappa free light chain: 24.1 mg/L — ABNORMAL HIGH (ref 3.3–19.4)
Kappa, lambda light chain ratio: 1.13 (ref 0.26–1.65)
Lambda free light chains: 21.3 mg/L (ref 5.7–26.3)

## 2019-06-26 ENCOUNTER — Other Ambulatory Visit (HOSPITAL_COMMUNITY): Payer: Self-pay | Admitting: Hematology

## 2019-06-26 DIAGNOSIS — Z515 Encounter for palliative care: Secondary | ICD-10-CM | POA: Insufficient documentation

## 2019-06-26 MED ORDER — HYDROCODONE-ACETAMINOPHEN 7.5-325 MG PO TABS: 1.0000 | ORAL_TABLET | Freq: Four times a day (QID) | ORAL | 0 refills | Status: DC | PRN

## 2019-07-10 ENCOUNTER — Other Ambulatory Visit (HOSPITAL_COMMUNITY): Payer: Self-pay | Admitting: Hematology

## 2019-07-10 DIAGNOSIS — C9000 Multiple myeloma not having achieved remission: Secondary | ICD-10-CM

## 2019-07-15 ENCOUNTER — Encounter (HOSPITAL_COMMUNITY): Payer: Self-pay | Admitting: *Deleted

## 2019-07-15 NOTE — Progress Notes (Signed)
Patient called the clinic to advise that her dentist is putting her on zpack for preventative treatment prior to some dental work.  She denies any current fevers or infections.  This is just prophylactic treatment.  I advised that it is okay to proceed.    She also advised that she is scheduled to return to [facility] on 12/2 for her next set of immunizations.

## 2019-07-20 ENCOUNTER — Inpatient Hospital Stay (HOSPITAL_COMMUNITY): Payer: Medicare Other | Attending: Hematology

## 2019-07-20 ENCOUNTER — Encounter (HOSPITAL_COMMUNITY): Payer: Self-pay | Admitting: Hematology

## 2019-07-20 ENCOUNTER — Inpatient Hospital Stay (HOSPITAL_BASED_OUTPATIENT_CLINIC_OR_DEPARTMENT_OTHER): Payer: Medicare Other | Admitting: Hematology

## 2019-07-20 ENCOUNTER — Other Ambulatory Visit: Payer: Self-pay

## 2019-07-20 DIAGNOSIS — C9000 Multiple myeloma not having achieved remission: Secondary | ICD-10-CM

## 2019-07-20 DIAGNOSIS — F419 Anxiety disorder, unspecified: Secondary | ICD-10-CM | POA: Insufficient documentation

## 2019-07-20 LAB — COMPREHENSIVE METABOLIC PANEL
ALT: 16 U/L (ref 0–44)
AST: 18 U/L (ref 15–41)
Albumin: 4.4 g/dL (ref 3.5–5.0)
Alkaline Phosphatase: 53 U/L (ref 38–126)
Anion gap: 9 (ref 5–15)
BUN: 26 mg/dL — ABNORMAL HIGH (ref 8–23)
CO2: 24 mmol/L (ref 22–32)
Calcium: 8.5 mg/dL — ABNORMAL LOW (ref 8.9–10.3)
Chloride: 105 mmol/L (ref 98–111)
Creatinine, Ser: 1.77 mg/dL — ABNORMAL HIGH (ref 0.44–1.00)
GFR calc Af Amer: 33 mL/min — ABNORMAL LOW (ref >60)
GFR calc non Af Amer: 28 mL/min — ABNORMAL LOW (ref >60)
Glucose, Bld: 88 mg/dL (ref 70–99)
Potassium: 4.1 mmol/L (ref 3.5–5.1)
Sodium: 138 mmol/L (ref 135–145)
Total Bilirubin: 0.3 mg/dL (ref 0.3–1.2)
Total Protein: 6.9 g/dL (ref 6.5–8.1)

## 2019-07-20 LAB — CBC WITH DIFFERENTIAL/PLATELET
Abs Immature Granulocytes: 0.01 10*3/uL (ref 0.00–0.07)
Basophils Absolute: 0.1 10*3/uL (ref 0.0–0.1)
Basophils Relative: 2 %
Eosinophils Absolute: 0.4 10*3/uL (ref 0.0–0.5)
Eosinophils Relative: 9 %
HCT: 30 % — ABNORMAL LOW (ref 36.0–46.0)
Hemoglobin: 9.6 g/dL — ABNORMAL LOW (ref 12.0–15.0)
Immature Granulocytes: 0 %
Lymphocytes Relative: 42 %
Lymphs Abs: 1.7 10*3/uL (ref 0.7–4.0)
MCH: 33.2 pg (ref 26.0–34.0)
MCHC: 32 g/dL (ref 30.0–36.0)
MCV: 103.8 fL — ABNORMAL HIGH (ref 80.0–100.0)
Monocytes Absolute: 0.5 10*3/uL (ref 0.1–1.0)
Monocytes Relative: 13 %
Neutro Abs: 1.3 10*3/uL — ABNORMAL LOW (ref 1.7–7.7)
Neutrophils Relative %: 34 %
Platelets: 141 10*3/uL — ABNORMAL LOW (ref 150–400)
RBC: 2.89 MIL/uL — ABNORMAL LOW (ref 3.87–5.11)
RDW: 13.1 % (ref 11.5–15.5)
WBC: 4 10*3/uL (ref 4.0–10.5)
nRBC: 0 % (ref 0.0–0.2)

## 2019-07-20 LAB — LACTATE DEHYDROGENASE: LDH: 137 U/L (ref 98–192)

## 2019-07-20 NOTE — Assessment & Plan Note (Signed)
1.  IgG lambda plasma cell myeloma, stage II, standard risk: - Cycle 1 KCyD on 04/17/2018, cycle 2 KRD with extensive skin rash. - Cycle 3 and 4 completed on 08/21/2018 with low-dose pomalidomide 2 mg. -Auto stem cell transplant on 10/23/2018. -PET scan on 01/21/2019 shows multiple bone lesions but without any hypermetabolic activity. -Bone marrow biopsy on 01/21/2019 shows normocellular trilineage hematopoiesis with maturation, no increase in blasts.  No increase in plasma cells (less than 1% by CD138 IHC).  Normal cytogenetics and FISH negative for myeloma.  MRD results are pending from [facility]. -Myeloma labs on 02/12/2019 shows negative SPEP.  Free light chain ratio was 1.41 with kappa light chains 15.7.  Immunofixation was positive for IgG lambda monoclonal protein. - Maintenance therapy with pomalidomide 2 mg 3 weeks on 1 week off started on 03/10/2019. - She was evaluated on 04/22/2019 at [facility].  Free kappa light chains were slightly elevated at 19.72.  Lambda light chains are 16.88.  Ratio was normal at 1.17. -She completed pomalidomide 2 days ago.  This is her off week. -She tolerated reasonably well except 1 week of diarrhea which was controlled with Imodium. -We reviewed myeloma panel from 06/18/2019.  M spike has increased to 0.4 g/dL.  However free light chain ratio was 1.13 with kappa light chains of 24.1 and lambda light chains of 21.3. -I would not make any changes at this time as this positive M spike could be transient.  We have sent another panel today.  I will see her back in 3 weeks to discuss the results.   2.  Back pain: -She will continue hydrocodone 7.5 mg as needed.  3.  Bone strengthening: -She is having dental work done.  She is scheduled to see dentist on 07/24/2019.  We will continue to hold denosumab.  4.  Anxiety: -She will continue Xanax 0.5 mg twice daily as needed.
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
## 2019-07-20 NOTE — Patient Instructions (Signed)
[place] at [facility] Discharge Instructions  You were seen today by Dr. Delton Coombes. He went over your recent lab results. He would like you to continue taking your pomalyst as directed. He will continue to hold your prolia. He will see you back in 4 weeks for labs and follow up.   Thank you for choosing [place] at [facility] to provide your oncology and hematology care.  To afford each patient quality time with our provider, please arrive at least 15 minutes before your scheduled appointment time.   If you have a lab appointment with the [facility] please come in thru the  Main Entrance and check in at the main information desk  You need to re-schedule your appointment should you arrive 10 or more minutes late.  We strive to give you quality time with our providers, and arriving late affects you and other patients whose appointments are after yours.  Also, if you no show three or more times for appointments you may be dismissed from the clinic at the providers discretion.     Again, thank you for choosing [facility].  Our hope is that these requests will decrease the amount of time that you wait before being seen by our physicians.       _____________________________________________________________  Should you have questions after your visit to [facility], please contact our office at [phone] between the hours of 8:00 a.m. and 4:30 p.m.  Voicemails left after 4:00 p.m. will not be returned until the following business day.  For prescription refill requests, have your pharmacy contact our office and allow 72 hours.    Cancer Center Support Programs:   > Cancer Support Group  2nd Tuesday of the month 1pm-2pm, Journey Room

## 2019-07-21 LAB — PROTEIN ELECTROPHORESIS, SERUM
A/G Ratio: 1.6 (ref 0.7–1.7)
Albumin ELP: 3.9 g/dL (ref 2.9–4.4)
Alpha-1-Globulin: 0.2 g/dL (ref 0.0–0.4)
Alpha-2-Globulin: 0.7 g/dL (ref 0.4–1.0)
Beta Globulin: 0.9 g/dL (ref 0.7–1.3)
Gamma Globulin: 0.6 g/dL (ref 0.4–1.8)
Globulin, Total: 2.5 g/dL (ref 2.2–3.9)
Total Protein ELP: 6.4 g/dL (ref 6.0–8.5)

## 2019-07-21 LAB — KAPPA/LAMBDA LIGHT CHAINS
Kappa free light chain: 26.4 mg/L — ABNORMAL HIGH (ref 3.3–19.4)
Kappa, lambda light chain ratio: 1.21 (ref 0.26–1.65)
Lambda free light chains: 21.8 mg/L (ref 5.7–26.3)

## 2019-07-21 MED ORDER — FULVESTRANT 250 MG/5ML IM SOLN
INTRAMUSCULAR | Status: AC
  Filled 2019-07-21: qty 5

## 2019-07-22 ENCOUNTER — Other Ambulatory Visit (HOSPITAL_COMMUNITY): Payer: Self-pay | Admitting: *Deleted

## 2019-07-22 DIAGNOSIS — C9000 Multiple myeloma not having achieved remission: Secondary | ICD-10-CM

## 2019-07-22 MED ORDER — HYDROCODONE-ACETAMINOPHEN 7.5-325 MG PO TABS: 1.0000 | ORAL_TABLET | Freq: Four times a day (QID) | ORAL | 0 refills | Status: DC | PRN

## 2019-07-22 MED ORDER — GLIPIZIDE ER 5 MG PO TB24: ORAL_TABLET | ORAL | 2 refills | Status: DC

## 2019-07-27 ENCOUNTER — Other Ambulatory Visit (HOSPITAL_COMMUNITY): Payer: Self-pay | Admitting: *Deleted

## 2019-07-27 DIAGNOSIS — C9000 Multiple myeloma not having achieved remission: Secondary | ICD-10-CM

## 2019-07-27 MED ORDER — ACYCLOVIR 400 MG PO TABS: 400.0000 mg | ORAL_TABLET | Freq: Two times a day (BID) | ORAL | 4 refills | Status: DC

## 2019-08-06 ENCOUNTER — Other Ambulatory Visit (HOSPITAL_COMMUNITY): Payer: Self-pay | Admitting: *Deleted

## 2019-08-06 DIAGNOSIS — C9000 Multiple myeloma not having achieved remission: Secondary | ICD-10-CM

## 2019-08-06 DIAGNOSIS — F419 Anxiety disorder, unspecified: Secondary | ICD-10-CM

## 2019-08-06 MED ORDER — ALPRAZOLAM 0.5 MG PO TABS: ORAL_TABLET | ORAL | 3 refills | Status: DC

## 2019-08-10 ENCOUNTER — Other Ambulatory Visit (HOSPITAL_COMMUNITY): Payer: Self-pay | Admitting: *Deleted

## 2019-08-10 ENCOUNTER — Other Ambulatory Visit (HOSPITAL_COMMUNITY): Payer: Self-pay | Admitting: Hematology

## 2019-08-10 DIAGNOSIS — C9000 Multiple myeloma not having achieved remission: Secondary | ICD-10-CM

## 2019-08-10 MED ORDER — POMALIDOMIDE 2 MG PO CAPS: ORAL_CAPSULE | ORAL | 1 refills | Status: DC

## 2019-08-13 ENCOUNTER — Encounter (HOSPITAL_COMMUNITY): Payer: Self-pay | Admitting: Hematology

## 2019-08-13 ENCOUNTER — Other Ambulatory Visit: Payer: Self-pay

## 2019-08-13 ENCOUNTER — Inpatient Hospital Stay (HOSPITAL_COMMUNITY): Payer: Medicare Other | Attending: Hematology | Admitting: Hematology

## 2019-08-13 ENCOUNTER — Inpatient Hospital Stay (HOSPITAL_COMMUNITY): Payer: Medicare Other

## 2019-08-13 VITALS — BP 159/47 | HR 55 | Temp 97.5°F | Resp 18 | Wt 165.7 lb

## 2019-08-13 DIAGNOSIS — F419 Anxiety disorder, unspecified: Secondary | ICD-10-CM | POA: Diagnosis not present

## 2019-08-13 DIAGNOSIS — C9000 Multiple myeloma not having achieved remission: Secondary | ICD-10-CM

## 2019-08-13 DIAGNOSIS — M549 Dorsalgia, unspecified: Secondary | ICD-10-CM | POA: Diagnosis not present

## 2019-08-13 LAB — CBC WITH DIFFERENTIAL/PLATELET
Abs Immature Granulocytes: 0.01 10*3/uL (ref 0.00–0.07)
Basophils Absolute: 0.1 10*3/uL (ref 0.0–0.1)
Basophils Relative: 2 %
Eosinophils Absolute: 0.8 10*3/uL — ABNORMAL HIGH (ref 0.0–0.5)
Eosinophils Relative: 21 %
HCT: 30.7 % — ABNORMAL LOW (ref 36.0–46.0)
Hemoglobin: 9.7 g/dL — ABNORMAL LOW (ref 12.0–15.0)
Immature Granulocytes: 0 %
Lymphocytes Relative: 25 %
Lymphs Abs: 0.9 10*3/uL (ref 0.7–4.0)
MCH: 33.1 pg (ref 26.0–34.0)
MCHC: 31.6 g/dL (ref 30.0–36.0)
MCV: 104.8 fL — ABNORMAL HIGH (ref 80.0–100.0)
Monocytes Absolute: 0.5 10*3/uL (ref 0.1–1.0)
Monocytes Relative: 14 %
Neutro Abs: 1.4 10*3/uL — ABNORMAL LOW (ref 1.7–7.7)
Neutrophils Relative %: 38 %
Platelets: 126 10*3/uL — ABNORMAL LOW (ref 150–400)
RBC: 2.93 MIL/uL — ABNORMAL LOW (ref 3.87–5.11)
RDW: 13.1 % (ref 11.5–15.5)
WBC: 3.6 10*3/uL — ABNORMAL LOW (ref 4.0–10.5)
nRBC: 0 % (ref 0.0–0.2)

## 2019-08-13 LAB — COMPREHENSIVE METABOLIC PANEL
ALT: 15 U/L (ref 0–44)
AST: 17 U/L (ref 15–41)
Albumin: 4.2 g/dL (ref 3.5–5.0)
Alkaline Phosphatase: 52 U/L (ref 38–126)
Anion gap: 11 (ref 5–15)
BUN: 26 mg/dL — ABNORMAL HIGH (ref 8–23)
CO2: 27 mmol/L (ref 22–32)
Calcium: 9 mg/dL (ref 8.9–10.3)
Chloride: 103 mmol/L (ref 98–111)
Creatinine, Ser: 1.82 mg/dL — ABNORMAL HIGH (ref 0.44–1.00)
GFR calc Af Amer: 32 mL/min — ABNORMAL LOW (ref >60)
GFR calc non Af Amer: 27 mL/min — ABNORMAL LOW (ref >60)
Glucose, Bld: 109 mg/dL — ABNORMAL HIGH (ref 70–99)
Potassium: 4.2 mmol/L (ref 3.5–5.1)
Sodium: 141 mmol/L (ref 135–145)
Total Bilirubin: 0.7 mg/dL (ref 0.3–1.2)
Total Protein: 7 g/dL (ref 6.5–8.1)

## 2019-08-13 NOTE — Progress Notes (Signed)
[facility] [place], [place] [address]   CLINIC:  Medical Oncology/Hematology  PCP:  [facility] [address] [place] [address] [phone]   REASON FOR VISIT:  Follow-up for multiple myeloma    BRIEF ONCOLOGIC HISTORY:  Oncology History  Multiple myeloma without remission ([facility])  04/15/2018 Initial Diagnosis   Multiple myeloma without remission ([facility])   04/17/2018 - 09/17/2018 Chemotherapy   The patient had palonosetron (ALOXI) injection 0.25 mg, 0.25 mg, Intravenous,  Once, 1 of 1 cycle Administration: 0.25 mg (04/17/2018), 0.25 mg (04/24/2018), 0.25 mg (05/01/2018) cyclophosphamide (CYTOXAN) 540 mg in sodium chloride 0.9 % 250 mL chemo infusion, 300 mg/m2 = 540 mg, Intravenous,  Once, 1 of 1 cycle Administration: 540 mg (04/17/2018), 540 mg (04/24/2018), 540 mg (05/01/2018) carfilzomib (KYPROLIS) 36 mg in dextrose 5 % 50 mL chemo infusion, 20 mg/m2 = 36 mg, Intravenous, Once, 5 of 5 cycles Administration: 36 mg (04/17/2018), 36 mg (04/18/2018), 60 mg (04/24/2018), 60 mg (04/25/2018), 60 mg (05/01/2018), 60 mg (05/02/2018), 60 mg (05/15/2018), 60 mg (05/16/2018), 60 mg (06/12/2018), 60 mg (06/13/2018), 60 mg (06/26/2018), 60 mg (06/27/2018), 60 mg (07/03/2018), 60 mg (07/04/2018), 60 mg (07/24/2018), 60 mg (07/25/2018), 60 mg (07/31/2018), 60 mg (08/01/2018), 60 mg (08/08/2018), 60 mg (08/07/2018), 60 mg (08/21/2018), 60 mg (08/22/2018), 60 mg (08/28/2018), 60 mg (08/29/2018), 60 mg (09/04/2018), 60 mg (09/05/2018)  for chemotherapy treatment.       CANCER STAGING: Cancer Staging No matching staging information was found for the patient.   INTERVAL HISTORY:  Alexandra Price 73 y.o. female was seen for follow-up of multiple myeloma.  She is taking pomalidomide maintenance therapy.  Appetite is 100%.  Energy levels are 75%.  Pain is reported as 5 out of 10 in the lower back region which is fairly controlled with hydrocodone as needed.  She has chronic cough  which is stable.  Constipation is also well managed with stool softeners.  Sleep problems are stable.  REVIEW OF SYSTEMS:  Review of Systems  Respiratory: Positive for cough.   Gastrointestinal: Positive for constipation.  Neurological: Positive for headaches.  Psychiatric/Behavioral: Positive for sleep disturbance.  All other systems reviewed and are negative.    PAST MEDICAL/SURGICAL HISTORY:  Past Medical History:  Diagnosis Date  . Anxiety   . Depression   . Hypertension   . Multiple myeloma ([facility])    multiple myeloma   Past Surgical History:  Procedure Laterality Date  . ABDOMINAL HYSTERECTOMY     total  . APPENDECTOMY    . LAPAROSCOPIC APPENDECTOMY N/A 05/20/2018   Procedure: APPENDECTOMY LAPAROSCOPIC;  Surgeon: [name], MD;  Location: [facility];  Service: General;  Laterality: N/A;  . PORTACATH PLACEMENT Right 04/14/2018   Procedure: INSERTION PORT-A-CATH;  Surgeon: [name], MD;  Location: [facility];  Service: General;  Laterality: Right;     SOCIAL HISTORY:  Social History   Socioeconomic History  . Marital status: Legally Separated    Spouse name: Not on file  . Number of children: 6  . Years of education: Not on file  . Highest education level: Not on file  Occupational History    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  Tobacco Use  . Smoking status: Never Smoker  . Smokeless tobacco: Never Used  Substance and Sexual Activity  . Alcohol use: Never  . Drug use: Not Currently  . Sexual activity: Not Currently  Other Topics Concern  . Not on file  Social History Narrative  .  Not on file   Social Determinants of Health   Financial Resource Strain:   . Difficulty of Paying Living Expenses: Not on file  Food Insecurity:   . Worried About Charity fundraiser in the Last Year: Not on file  . Ran Out of Food in the Last Year: Not on file  Transportation Needs:   . Lack of Transportation (Medical): Not on file  . Lack of Transportation (Non-Medical): Not  on file  Physical Activity:   . Days of Exercise per Week: Not on file  . Minutes of Exercise per Session: Not on file  Stress:   . Feeling of Stress : Not on file  Social Connections:   . Frequency of Communication with Friends and Family: Not on file  . Frequency of Social Gatherings with Friends and Family: Not on file  . Attends Religious Services: Not on file  . Active Member of Clubs or Organizations: Not on file  . Attends Archivist Meetings: Not on file  . Marital Status: Not on file  Intimate Partner Violence:   . Fear of Current or Ex-Partner: Not on file  . Emotionally Abused: Not on file  . Physically Abused: Not on file  . Sexually Abused: Not on file    FAMILY HISTORY:  Family History  Problem Relation Age of Onset  . Heart disease Mother   . Emphysema Father   . Diabetes Sister   . Depression Sister   . Cancer Brother        liver, lung, and colon  . Diabetes Brother     CURRENT MEDICATIONS:  Outpatient Encounter Medications as of 08/13/2019  Medication Sig  . acyclovir (ZOVIRAX) 400 MG tablet Take 1 tablet (400 mg total) by mouth 2 (two) times daily.  Marland Kitchen ALPRAZolam (XANAX) 0.5 MG tablet Take one tablet during the day as needed for anxiety and two tablets at bedtime as needed for anxiety/insomnia  . aspirin EC 81 MG tablet Take 81 mg by mouth daily.  . Calcium Carb-Cholecalciferol (CALCIUM 1000 + D PO) Take 1,000 mg by mouth daily. Take one tablet daily until next visit with oncologist.  . citalopram (CELEXA) 40 MG tablet Take 1 tablet (40 mg total) by mouth daily.  Marland Kitchen glipiZIDE (GLUCOTROL XL) 5 MG 24 hr tablet Take 1 tablet by mouth once daily with breakfast  . HYDROcodone-acetaminophen (NORCO) 7.5-325 MG tablet Take 1 tablet by mouth every 6 (six) hours as needed for moderate pain.  . Multiple Vitamin (THERA) TABS Take 1 tablet by mouth daily.  . pantoprazole (PROTONIX) 40 MG tablet Take 1 tablet (40 mg total) by mouth daily.  . pomalidomide  (POMALYST) 2 MG capsule TAKE 1 CAPSULE BY MOUTH ONCE DAILY FOR 21 DAYS ON AND 7 DAYS OFF  . zolpidem (AMBIEN) 10 MG tablet Take 1 tablet (10 mg total) by mouth at bedtime as needed for sleep. (Patient taking differently: Take 10 mg by mouth at bedtime. )  . chlorhexidine (PERIDEX) 0.12 % solution Use as directed 10 mLs in the mouth or throat 3 times daily. Swish for 30 seconds and then spit  . ondansetron (ZOFRAN) 4 MG tablet Take 1 tablet (4 mg total) by mouth every 8 (eight) hours as needed for nausea or vomiting. (Patient not taking: Reported on 05/21/2019)  . terbinafine (LAMISIL) 1 % cream Apply 1 application topically 2 (two) times daily. (Patient not taking: Reported on 08/13/2019)  . [DISCONTINUED] prochlorperazine (COMPAZINE) 10 MG tablet Take 1 tablet (10 mg  total) by mouth every 6 (six) hours as needed (Nausea or vomiting).   No facility-administered encounter medications on file as of 08/13/2019.    ALLERGIES:  Allergies  Allergen Reactions  . Ciprofloxacin Anaphylaxis  . Amoxicillin   . Morphine And Related Nausea And Vomiting  . Augmentin [Amoxicillin-Pot Clavulanate] Other (See Comments)    Headache, insomnia     PHYSICAL EXAM:  ECOG Performance status: 1  Vitals:   08/13/19 1051  BP: (!) 159/47  Pulse: (!) 55  Resp: 18  Temp: (!) 97.5 F (36.4 C)  SpO2: 100%   Filed Weights   08/13/19 1051  Weight: 165 lb 11.2 oz (75.2 kg)    Physical Exam Vitals reviewed.  Constitutional:      Appearance: Normal appearance.  Cardiovascular:     Rate and Rhythm: Normal rate and regular rhythm.     Heart sounds: Normal heart sounds.  Pulmonary:     Effort: Pulmonary effort is normal.     Breath sounds: Normal breath sounds.  Abdominal:     General: There is no distension.     Palpations: Abdomen is soft. There is no mass.  Musculoskeletal:        General: No swelling.  Skin:    General: Skin is warm.  Neurological:     Mental Status: She is alert and oriented to  person, place, and time.  Psychiatric:        Mood and Affect: Mood normal.        Behavior: Behavior normal.      LABORATORY DATA:  I have reviewed the labs as listed.  CBC    Component Value Date/Time   WBC 3.6 (L) 08/13/2019 0917   RBC 2.93 (L) 08/13/2019 0917   HGB 9.7 (L) 08/13/2019 0917   HGB 9.4 (L) 12/15/2018 1244   HCT 30.7 (L) 08/13/2019 0917   PLT 126 (L) 08/13/2019 0917   MCV 104.8 (H) 08/13/2019 0917   MCH 33.1 08/13/2019 0917   MCHC 31.6 08/13/2019 0917   RDW 13.1 08/13/2019 0917   LYMPHSABS 0.9 08/13/2019 0917   MONOABS 0.5 08/13/2019 0917   EOSABS 0.8 (H) 08/13/2019 0917   BASOSABS 0.1 08/13/2019 0917   CMP Latest Ref Rng & Units 08/13/2019 07/20/2019 06/18/2019  Glucose 70 - 99 mg/dL 109(H) 88 81  BUN 8 - 23 mg/dL 26(H) 26(H) 29(H)  Creatinine 0.44 - 1.00 mg/dL 1.82(H) 1.77(H) 1.86(H)  Sodium 135 - 145 mmol/L 141 138 141  Potassium 3.5 - 5.1 mmol/L 4.2 4.1 4.5  Chloride 98 - 111 mmol/L 103 105 107  CO2 22 - 32 mmol/L _0 Calcium 8.9 - 10.3 mg/dL 9.0 8.5(L) 9.1  Total Protein 6.5 - 8.1 g/dL 7.0 6.9 7.2  Total Bilirubin 0.3 - 1.2 mg/dL 0.7 0.3 0.5  Alkaline Phos 38 - 126 U/L 52 53 48  AST 15 - 41 U/L _1 ALT 0 - 44 U/L _2 DIAGNOSTIC IMAGING:  I have independently reviewed the scans and discussed with the patient.   I have reviewed Venita Lick LPN's note and agree with the documentation.  I personally performed a face-to-face visit, made revisions and my assessment and plan is as follows.    ASSESSMENT & PLAN:   Multiple myeloma not having achieved remission ([facility]) 1.  IgG lambda plasma cell myeloma, stage II, standard risk: - Cycle 1 KCyD on 04/17/2018, cycle 2 KRD with extensive skin rash. - Cycle  3 and 4 completed on 08/21/2018 with low-dose pomalidomide 2 mg. -Auto stem cell transplant on 10/23/2018. -PET scan on 01/21/2019 shows multiple bone lesions but without any hypermetabolic activity. -Bone marrow biopsy on  01/21/2019 shows normocellular trilineage hematopoiesis with maturation, no increase in blasts.  No increase in plasma cells (less than 1% by CD138 IHC).  Normal cytogenetics and FISH negative for myeloma.  MRD results are pending from [facility]. -Myeloma labs on 02/12/2019 shows negative SPEP.  Free light chain ratio was 1.41 with kappa light chains 15.7.  Immunofixation was positive for IgG lambda monoclonal protein. - Maintenance therapy with pomalidomide 2 mg 3 weeks on 1 week off started on 03/10/2019. - She was evaluated on 04/22/2019 at [facility].  Free kappa light chains were slightly elevated at 19.72.  Lambda light chains are 16.88.  Ratio was normal at 1.17. -She will complete pomalidomide on 08/14/2019. -We reviewed myeloma panel from 07/20/2019.  M spike is undetectable.  Previous M spike was 0.4 g on 06/18/2019.  Free light chain ratio is 1.21.  Kappa light chains at 26.4. -Mild neutropenia with ANC of 1400.  She will continue pomalidomide 2 mg 3 weeks on 1 week off.  She has mild constipation but otherwise tolerating it well. -We will reevaluate her in 4 weeks for follow-up.   2.  Back pain: -She will continue hydrocodone 7.5 mg as needed.  3.  Bone strengthening: -She was seen by dentist and found to have bone exposed in the lower jaw.  She has a follow-up again next month.  We are continuing to hold denosumab at this time.  4.  Anxiety: -She will continue Xanax 0.5 mg twice daily as needed.       Orders placed this encounter:  Orders Placed This Encounter  Procedures  . CBC with Differential/Platelet  . Comprehensive metabolic panel  . Protein electrophoresis, serum  . Kappa/lambda light chains  . Lactate dehydrogenase      [name], MD [place] [phone]

## 2019-08-13 NOTE — Patient Instructions (Addendum)
[facility] at [facility] Discharge Instructions  You were seen today by Dr. Katragadda. He went over your recent lab results. He will see you back in 4 weeks for labs and follow up.   Thank you for choosing [facility] at [facility] to provide your oncology and hematology care.  To afford each patient quality time with our provider, please arrive at least 15 minutes before your scheduled appointment time.   If you have a lab appointment with the Cancer Center please come in thru the  Main Entrance and check in at the main information desk  You need to re-schedule your appointment should you arrive 10 or more minutes late.  We strive to give you quality time with our providers, and arriving late affects you and other patients whose appointments are after yours.  Also, if you no show three or more times for appointments you may be dismissed from the clinic at the providers discretion.     Again, thank you for choosing [facility].  Our hope is that these requests will decrease the amount of time that you wait before being seen by our physicians.       _____________________________________________________________  Should you have questions after your visit to [facility], please contact our office at [phone] between the hours of 8:00 a.m. and 4:30 p.m.  Voicemails left after 4:00 p.m. will not be returned until the following business day.  For prescription refill requests, have your pharmacy contact our office and allow 72 hours.    Cancer Center Support Programs:   > Cancer Support Group  2nd Tuesday of the month 1pm-2pm, Journey Room    

## 2019-08-13 NOTE — Assessment & Plan Note (Signed)
1.  IgG lambda plasma cell myeloma, stage II, standard risk: - Cycle 1 KCyD on 04/17/2018, cycle 2 KRD with extensive skin rash. - Cycle 3 and 4 completed on 08/21/2018 with low-dose pomalidomide 2 mg. -Auto stem cell transplant on 10/23/2018. -PET scan on 01/21/2019 shows multiple bone lesions but without any hypermetabolic activity. -Bone marrow biopsy on 01/21/2019 shows normocellular trilineage hematopoiesis with maturation, no increase in blasts.  No increase in plasma cells (less than 1% by CD138 IHC).  Normal cytogenetics and FISH negative for myeloma.  MRD results are pending from [facility]. -Myeloma labs on 02/12/2019 shows negative SPEP.  Free light chain ratio was 1.41 with kappa light chains 15.7.  Immunofixation was positive for IgG lambda monoclonal protein. - Maintenance therapy with pomalidomide 2 mg 3 weeks on 1 week off started on 03/10/2019. - She was evaluated on 04/22/2019 at [facility].  Free kappa light chains were slightly elevated at 19.72.  Lambda light chains are 16.88.  Ratio was normal at 1.17. -She will complete pomalidomide on 08/14/2019. -We reviewed myeloma panel from 07/20/2019.  M spike is undetectable.  Previous M spike was 0.4 g on 06/18/2019.  Free light chain ratio is 1.21.  Kappa light chains at 26.4. -Mild neutropenia with ANC of 1400.  She will continue pomalidomide 2 mg 3 weeks on 1 week off.  She has mild constipation but otherwise tolerating it well. -We will reevaluate her in 4 weeks for follow-up.   2.  Back pain: -She will continue hydrocodone 7.5 mg as needed.  3.  Bone strengthening: -She was seen by dentist and found to have bone exposed in the lower jaw.  She has a follow-up again next month.  We are continuing to hold denosumab at this time.  4.  Anxiety: -She will continue Xanax 0.5 mg twice daily as needed.

## 2019-08-17 LAB — IMMUNOFIXATION ELECTROPHORESIS
IgA: 62 mg/dL — ABNORMAL LOW (ref 64–422)
IgG (Immunoglobin G), Serum: 828 mg/dL (ref 586–1602)
IgM (Immunoglobulin M), Srm: 14 mg/dL — ABNORMAL LOW (ref 26–217)
Total Protein ELP: 6.5 g/dL (ref 6.0–8.5)

## 2019-08-17 LAB — KAPPA/LAMBDA LIGHT CHAINS
Kappa free light chain: 27.9 mg/L — ABNORMAL HIGH (ref 3.3–19.4)
Kappa, lambda light chain ratio: 1.42 (ref 0.26–1.65)
Lambda free light chains: 19.6 mg/L (ref 5.7–26.3)

## 2019-08-18 ENCOUNTER — Other Ambulatory Visit (HOSPITAL_COMMUNITY): Payer: Self-pay | Admitting: *Deleted

## 2019-08-18 DIAGNOSIS — C9000 Multiple myeloma not having achieved remission: Secondary | ICD-10-CM

## 2019-08-18 DIAGNOSIS — R42 Dizziness and giddiness: Secondary | ICD-10-CM

## 2019-08-18 LAB — PROTEIN ELECTROPHORESIS, SERUM
A/G Ratio: 1.7 (ref 0.7–1.7)
Albumin ELP: 4 g/dL (ref 2.9–4.4)
Alpha-1-Globulin: 0.2 g/dL (ref 0.0–0.4)
Alpha-2-Globulin: 0.7 g/dL (ref 0.4–1.0)
Beta Globulin: 0.8 g/dL (ref 0.7–1.3)
Gamma Globulin: 0.6 g/dL (ref 0.4–1.8)
Globulin, Total: 2.3 g/dL (ref 2.2–3.9)
Total Protein ELP: 6.3 g/dL (ref 6.0–8.5)

## 2019-08-18 MED ORDER — PANTOPRAZOLE SODIUM 40 MG PO TBEC: 40.0000 mg | DELAYED_RELEASE_TABLET | Freq: Every day | ORAL | 2 refills | Status: DC

## 2019-08-20 ENCOUNTER — Other Ambulatory Visit (HOSPITAL_COMMUNITY): Payer: Self-pay | Admitting: *Deleted

## 2019-08-20 MED ORDER — HYDROCODONE-ACETAMINOPHEN 7.5-325 MG PO TABS: 1.0000 | ORAL_TABLET | Freq: Four times a day (QID) | ORAL | 0 refills | Status: DC | PRN

## 2019-09-01 ENCOUNTER — Other Ambulatory Visit (HOSPITAL_COMMUNITY): Payer: Self-pay | Admitting: *Deleted

## 2019-09-01 DIAGNOSIS — G479 Sleep disorder, unspecified: Secondary | ICD-10-CM

## 2019-09-01 MED ORDER — ZOLPIDEM TARTRATE 10 MG PO TABS: 10.0000 mg | ORAL_TABLET | Freq: Every evening | ORAL | 2 refills | Status: DC | PRN

## 2019-09-07 ENCOUNTER — Other Ambulatory Visit (HOSPITAL_COMMUNITY): Payer: Self-pay | Admitting: Nurse Practitioner

## 2019-09-07 DIAGNOSIS — C9000 Multiple myeloma not having achieved remission: Secondary | ICD-10-CM

## 2019-09-08 ENCOUNTER — Other Ambulatory Visit (HOSPITAL_COMMUNITY): Payer: Self-pay | Admitting: *Deleted

## 2019-09-08 DIAGNOSIS — C9000 Multiple myeloma not having achieved remission: Secondary | ICD-10-CM

## 2019-09-08 MED ORDER — POMALIDOMIDE 2 MG PO CAPS: ORAL_CAPSULE | ORAL | 1 refills | Status: DC

## 2019-09-08 NOTE — Telephone Encounter (Signed)
Chart reviewed, revlimid refilled. 

## 2019-09-10 ENCOUNTER — Encounter (HOSPITAL_COMMUNITY): Payer: Self-pay | Admitting: Hematology

## 2019-09-10 ENCOUNTER — Inpatient Hospital Stay (HOSPITAL_COMMUNITY): Payer: Medicare Other | Attending: Hematology | Admitting: Hematology

## 2019-09-10 ENCOUNTER — Inpatient Hospital Stay (HOSPITAL_COMMUNITY): Payer: Medicare Other

## 2019-09-10 ENCOUNTER — Other Ambulatory Visit: Payer: Self-pay

## 2019-09-10 VITALS — BP 156/59 | HR 53 | Temp 97.1°F | Resp 18 | Wt 168.3 lb

## 2019-09-10 DIAGNOSIS — M7989 Other specified soft tissue disorders: Secondary | ICD-10-CM

## 2019-09-10 DIAGNOSIS — C9000 Multiple myeloma not having achieved remission: Secondary | ICD-10-CM | POA: Insufficient documentation

## 2019-09-10 LAB — CBC WITH DIFFERENTIAL/PLATELET
Abs Immature Granulocytes: 0.01 10*3/uL (ref 0.00–0.07)
Basophils Absolute: 0.1 10*3/uL (ref 0.0–0.1)
Basophils Relative: 2 %
Eosinophils Absolute: 0.5 10*3/uL (ref 0.0–0.5)
Eosinophils Relative: 13 %
HCT: 27.7 % — ABNORMAL LOW (ref 36.0–46.0)
Hemoglobin: 8.7 g/dL — ABNORMAL LOW (ref 12.0–15.0)
Immature Granulocytes: 0 %
Lymphocytes Relative: 29 %
Lymphs Abs: 1 10*3/uL (ref 0.7–4.0)
MCH: 33.1 pg (ref 26.0–34.0)
MCHC: 31.4 g/dL (ref 30.0–36.0)
MCV: 105.3 fL — ABNORMAL HIGH (ref 80.0–100.0)
Monocytes Absolute: 0.5 10*3/uL (ref 0.1–1.0)
Monocytes Relative: 15 %
Neutro Abs: 1.5 10*3/uL — ABNORMAL LOW (ref 1.7–7.7)
Neutrophils Relative %: 41 %
Platelets: 102 10*3/uL — ABNORMAL LOW (ref 150–400)
RBC: 2.63 MIL/uL — ABNORMAL LOW (ref 3.87–5.11)
RDW: 13.2 % (ref 11.5–15.5)
WBC: 3.6 10*3/uL — ABNORMAL LOW (ref 4.0–10.5)
nRBC: 0 % (ref 0.0–0.2)

## 2019-09-10 LAB — COMPREHENSIVE METABOLIC PANEL
ALT: 15 U/L (ref 0–44)
AST: 19 U/L (ref 15–41)
Albumin: 4.2 g/dL (ref 3.5–5.0)
Alkaline Phosphatase: 43 U/L (ref 38–126)
Anion gap: 9 (ref 5–15)
BUN: 25 mg/dL — ABNORMAL HIGH (ref 8–23)
CO2: 26 mmol/L (ref 22–32)
Calcium: 8.8 mg/dL — ABNORMAL LOW (ref 8.9–10.3)
Chloride: 102 mmol/L (ref 98–111)
Creatinine, Ser: 1.68 mg/dL — ABNORMAL HIGH (ref 0.44–1.00)
GFR calc Af Amer: 35 mL/min — ABNORMAL LOW (ref >60)
GFR calc non Af Amer: 30 mL/min — ABNORMAL LOW (ref >60)
Glucose, Bld: 76 mg/dL (ref 70–99)
Potassium: 4.2 mmol/L (ref 3.5–5.1)
Sodium: 137 mmol/L (ref 135–145)
Total Bilirubin: 0.4 mg/dL (ref 0.3–1.2)
Total Protein: 6.8 g/dL (ref 6.5–8.1)

## 2019-09-10 LAB — LACTATE DEHYDROGENASE: LDH: 143 U/L (ref 98–192)

## 2019-09-10 NOTE — Progress Notes (Signed)
[place], [place] [address]   CLINIC:  Medical Oncology/Hematology  PCP:  [facility] [address] [place] [address] [phone]   REASON FOR VISIT:  Follow-up for multiple myeloma    BRIEF ONCOLOGIC HISTORY:  Oncology History  Multiple myeloma without remission ([place])  04/15/2018 Initial Diagnosis   Multiple myeloma without remission ([place])   04/17/2018 - 09/17/2018 Chemotherapy   The patient had palonosetron (ALOXI) injection 0.25 mg, 0.25 mg, Intravenous,  Once, 1 of 1 cycle Administration: 0.25 mg (04/17/2018), 0.25 mg (04/24/2018), 0.25 mg (05/01/2018) cyclophosphamide (CYTOXAN) 540 mg in sodium chloride 0.9 % 250 mL chemo infusion, 300 mg/m2 = 540 mg, Intravenous,  Once, 1 of 1 cycle Administration: 540 mg (04/17/2018), 540 mg (04/24/2018), 540 mg (05/01/2018) carfilzomib (KYPROLIS) 36 mg in dextrose 5 % 50 mL chemo infusion, 20 mg/m2 = 36 mg, Intravenous, Once, 5 of 5 cycles Administration: 36 mg (04/17/2018), 36 mg (04/18/2018), 60 mg (04/24/2018), 60 mg (04/25/2018), 60 mg (05/01/2018), 60 mg (05/02/2018), 60 mg (05/15/2018), 60 mg (05/16/2018), 60 mg (06/12/2018), 60 mg (06/13/2018), 60 mg (06/26/2018), 60 mg (06/27/2018), 60 mg (07/03/2018), 60 mg (07/04/2018), 60 mg (07/24/2018), 60 mg (07/25/2018), 60 mg (07/31/2018), 60 mg (08/01/2018), 60 mg (08/08/2018), 60 mg (08/07/2018), 60 mg (08/21/2018), 60 mg (08/22/2018), 60 mg (08/28/2018), 60 mg (08/29/2018), 60 mg (09/04/2018), 60 mg (09/05/2018)  for chemotherapy treatment.       CANCER STAGING: Cancer Staging No matching staging information was found for the patient.   INTERVAL HISTORY:  Alexandra Price 74 y.o. female for follow-up of multiple myeloma.  Reports appetite 100%.  Energy levels are low at 75%.  Reported left leg swelling and pain in the last couple of weeks.  Also noticed bruising in the left leg and had a minor trauma to the area.  She is taking aspirin 81 mg daily.  Left  leg pain is reported as 5 out of 10.  Denies any bleeding per rectum or melena.  REVIEW OF SYSTEMS:  Review of Systems  Musculoskeletal:       Left leg pain.  Neurological: Positive for headaches.  Psychiatric/Behavioral: Positive for sleep disturbance.  All other systems reviewed and are negative.    PAST MEDICAL/SURGICAL HISTORY:  Past Medical History:  Diagnosis Date  . Anxiety   . Depression   . Hypertension   . Multiple myeloma ([place])    multiple myeloma   Past Surgical History:  Procedure Laterality Date  . ABDOMINAL HYSTERECTOMY     total  . APPENDECTOMY    . LAPAROSCOPIC APPENDECTOMY N/A 05/20/2018   Procedure: APPENDECTOMY LAPAROSCOPIC;  Surgeon: [name], MD;  Location: [facility];  Service: General;  Laterality: N/A;  . PORTACATH PLACEMENT Right 04/14/2018   Procedure: INSERTION PORT-A-CATH;  Surgeon: [name], MD;  Location: [facility];  Service: General;  Laterality: Right;     SOCIAL HISTORY:  Social History   Socioeconomic History  . Marital status: Legally Separated    Spouse name: Not on file  . Number of children: 6  . Years of education: Not on file  . Highest education level: Not on file  Occupational History    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  Tobacco Use  . Smoking status: Never Smoker  . Smokeless tobacco: Never Used  Substance and Sexual Activity  . Alcohol use: Never  . Drug use: Not Currently  . Sexual activity: Not Currently  Other Topics Concern  . Not on file  Social History Narrative  . Not on file   Social Determinants of Health   Financial Resource Strain:   . Difficulty of Paying Living Expenses: Not on file  Food Insecurity:   . Worried About Charity fundraiser in the Last Year: Not on file  . Ran Out of Food in the Last Year: Not on file  Transportation Needs:   . Lack of Transportation (Medical): Not on file  . Lack of Transportation (Non-Medical): Not on file  Physical Activity:   . Days of Exercise per Week: Not  on file  . Minutes of Exercise per Session: Not on file  Stress:   . Feeling of Stress : Not on file  Social Connections:   . Frequency of Communication with Friends and Family: Not on file  . Frequency of Social Gatherings with Friends and Family: Not on file  . Attends Religious Services: Not on file  . Active Member of Clubs or Organizations: Not on file  . Attends Archivist Meetings: Not on file  . Marital Status: Not on file  Intimate Partner Violence:   . Fear of Current or Ex-Partner: Not on file  . Emotionally Abused: Not on file  . Physically Abused: Not on file  . Sexually Abused: Not on file    FAMILY HISTORY:  Family History  Problem Relation Age of Onset  . Heart disease Mother   . Emphysema Father   . Diabetes Sister   . Depression Sister   . Cancer Brother        liver, lung, and colon  . Diabetes Brother     CURRENT MEDICATIONS:  Outpatient Encounter Medications as of 09/10/2019  Medication Sig  . acyclovir (ZOVIRAX) 400 MG tablet Take 1 tablet (400 mg total) by mouth 2 (two) times daily.  Marland Kitchen ALPRAZolam (XANAX) 0.5 MG tablet Take one tablet during the day as needed for anxiety and two tablets at bedtime as needed for anxiety/insomnia  . aspirin EC 81 MG tablet Take 81 mg by mouth daily.  . Calcium Carb-Cholecalciferol (CALCIUM 1000 + D PO) Take 1,000 mg by mouth daily. Take one tablet daily until next visit with oncologist.  . citalopram (CELEXA) 40 MG tablet Take 1 tablet (40 mg total) by mouth daily.  Marland Kitchen glipiZIDE (GLUCOTROL XL) 5 MG 24 hr tablet Take 1 tablet by mouth once daily with breakfast  . Multiple Vitamin (THERA) TABS Take 1 tablet by mouth daily.  . pantoprazole (PROTONIX) 40 MG tablet Take 1 tablet (40 mg total) by mouth daily.  . pomalidomide (POMALYST) 2 MG capsule TAKE 1 CAPSULE BY MOUTH ONCE DAILY FOR 21 DAYS ON AND 7 DAYS OFF  . zolpidem (AMBIEN) 10 MG tablet Take 1 tablet (10 mg total) by mouth at bedtime as needed for sleep.  Marland Kitchen  HYDROcodone-acetaminophen (NORCO) 7.5-325 MG tablet Take 1 tablet by mouth every 6 (six) hours as needed for moderate pain. (Patient not taking: Reported on 09/10/2019)  . ondansetron (ZOFRAN) 4 MG tablet Take 1 tablet (4 mg total) by mouth every 8 (eight) hours as needed for nausea or vomiting. (Patient not taking: Reported on 05/21/2019)  . terbinafine (LAMISIL) 1 % cream Apply 1 application topically 2 (two) times daily. (Patient not taking: Reported on 08/13/2019)  . [DISCONTINUED] chlorhexidine (PERIDEX) 0.12 % solution Use as directed 10 mLs in the mouth or throat 3 times daily. Swish for 30 seconds and then spit  . [DISCONTINUED] prochlorperazine (COMPAZINE) 10 MG tablet Take 1 tablet (10  mg total) by mouth every 6 (six) hours as needed (Nausea or vomiting).   No facility-administered encounter medications on file as of 09/10/2019.    ALLERGIES:  Allergies  Allergen Reactions  . Ciprofloxacin Anaphylaxis  . Amoxicillin   . Morphine And Related Nausea And Vomiting  . Augmentin [Amoxicillin-Pot Clavulanate] Other (See Comments)    Headache, insomnia     PHYSICAL EXAM:  ECOG Performance status: 1  Vitals:   09/10/19 1532  BP: (!) 156/59  Pulse: (!) 53  Resp: 18  Temp: (!) 97.1 F (36.2 C)  SpO2: 97%   Filed Weights   09/10/19 1532  Weight: 168 lb 4.8 oz (76.3 kg)    Physical Exam Vitals reviewed.  Constitutional:      Appearance: Normal appearance.  Cardiovascular:     Rate and Rhythm: Normal rate and regular rhythm.     Heart sounds: Normal heart sounds.  Pulmonary:     Effort: Pulmonary effort is normal.     Breath sounds: Normal breath sounds.  Abdominal:     General: There is no distension.     Palpations: Abdomen is soft. There is no mass.  Musculoskeletal:        General: No swelling.     Left lower leg: Edema present.  Skin:    General: Skin is warm.  Neurological:     Mental Status: She is alert and oriented to person, place, and time.  Psychiatric:         Mood and Affect: Mood normal.        Behavior: Behavior normal.      LABORATORY DATA:  I have reviewed the labs as listed.  CBC    Component Value Date/Time   WBC 3.6 (L) 09/10/2019 1452   RBC 2.63 (L) 09/10/2019 1452   HGB 8.7 (L) 09/10/2019 1452   HGB 9.4 (L) 12/15/2018 1244   HCT 27.7 (L) 09/10/2019 1452   PLT 102 (L) 09/10/2019 1452   MCV 105.3 (H) 09/10/2019 1452   MCH 33.1 09/10/2019 1452   MCHC 31.4 09/10/2019 1452   RDW 13.2 09/10/2019 1452   LYMPHSABS 1.0 09/10/2019 1452   MONOABS 0.5 09/10/2019 1452   EOSABS 0.5 09/10/2019 1452   BASOSABS 0.1 09/10/2019 1452   CMP Latest Ref Rng & Units 09/10/2019 08/13/2019 07/20/2019  Glucose 70 - 99 mg/dL 76 109(H) 88  BUN 8 - 23 mg/dL 25(H) 26(H) 26(H)  Creatinine 0.44 - 1.00 mg/dL 1.68(H) 1.82(H) 1.77(H)  Sodium 135 - 145 mmol/L 137 141 138  Potassium 3.5 - 5.1 mmol/L 4.2 4.2 4.1  Chloride 98 - 111 mmol/L 102 103 105  CO2 22 - 32 mmol/L _0 Calcium 8.9 - 10.3 mg/dL 8.8(L) 9.0 8.5(L)  Total Protein 6.5 - 8.1 g/dL 6.8 7.0 6.9  Total Bilirubin 0.3 - 1.2 mg/dL 0.4 0.7 0.3  Alkaline Phos 38 - 126 U/L 43 52 53  AST 15 - 41 U/L _1 ALT 0 - 44 U/L _2 DIAGNOSTIC IMAGING:  I have independently reviewed the scans and discussed with the patient.   I have reviewed Alexandra Lick LPN's note and agree with the documentation.  I personally performed a face-to-face visit, made revisions and my assessment and plan is as follows.    ASSESSMENT & PLAN:   Multiple myeloma without remission ([facility]) 1.  IgG lambda plasma cell myeloma, stage II, standard risk: - Cycle 1 KCyD on 04/17/2018, cycle 2  KRD with extensive skin rash. - Cycle 3 and 4 completed on 08/21/2018 with low-dose pomalidomide 2 mg. -Auto stem cell transplant on 10/23/2018. -PET scan on 01/21/2019 shows multiple bone lesions but without any hypermetabolic activity. -Bone marrow biopsy on 01/21/2019 shows normocellular trilineage hematopoiesis with  maturation, no increase in blasts.  No increase in plasma cells (less than 1% by CD138 IHC).  Normal cytogenetics and FISH negative for myeloma.  MRD results are pending from [facility]. -Myeloma labs on 02/12/2019 shows negative SPEP.  Free light chain ratio was 1.41 with kappa light chains 15.7.  Immunofixation was positive for IgG lambda monoclonal protein. - Maintenance therapy with pomalidomide 2 mg 3 weeks on 1 week off started on 03/10/2019. -She is continuing to tolerate pomalidomide very well. -We reviewed myeloma panel which showed no M spike.  Immunofixation was negative.  Free light chains are ranging between 26 and 27.  Ratio is 1.42.  LDH was normal. -No sign of recurrence of myeloma at this time.  She will continue pomalidomide as planned maintenance. -We will reevaluate her in 1 month.   2.  Back pain: -She will continue hydrocodone 7.5 mg as needed.  3.  Bone strengthening: -She had exposure of bone in the lower jaw. -We are holding Xgeva.  Last dose was in June 2020.  4.  Anxiety: -She will continue Xanax 0.5 mg twice daily as needed.  5.  Left leg swelling: -She reported left leg swelling which is recent onset.  There is mild erythema. -I have done a Doppler which was negative for DVT.  However it showed subcutaneous hemorrhage in the left medial calf area.  6.  Normocytic anemia: -Hemoglobin was 8.7.  Previous ferritin was 221 and percent saturation was 16. -B26 and folic acid were normal. -Anemia from combination of CKD and relative iron deficiency.-I have talked to her about initiating her on Feraheme weekly x2.  We talked about the side effects including chance of anaphylactic reactions.  She will proceed with the infusion.    Time spent is 30 minutes with more than 50% of the time spent face-to-face.  Orders placed this encounter:  Orders Placed This Encounter  Procedures  . US Venous Img Lower Unilateral Left (DVT)      [name], MD [place] [phone]

## 2019-09-10 NOTE — Patient Instructions (Addendum)
[place] at [facility] Discharge Instructions  You were seen today by Dr. Delton Coombes. He went over your recent lab results. He will order an ultrasound of your left leg to rule out a blood clot due to your injury. He will schedule you for 2 doses of IV Iron. He will see you back in for labs and follow up.   Thank you for choosing [place] at [facility] to provide your oncology and hematology care.  To afford each patient quality time with our provider, please arrive at least 15 minutes before your scheduled appointment time.   If you have a lab appointment with the [facility] please come in thru the  Main Entrance and check in at the main information desk  You need to re-schedule your appointment should you arrive 10 or more minutes late.  We strive to give you quality time with our providers, and arriving late affects you and other patients whose appointments are after yours.  Also, if you no show three or more times for appointments you may be dismissed from the clinic at the providers discretion.     Again, thank you for choosing [facility].  Our hope is that these requests will decrease the amount of time that you wait before being seen by our physicians.       _____________________________________________________________  Should you have questions after your visit to [facility], please contact our office at [phone] between the hours of 8:00 a.m. and 4:30 p.m.  Voicemails left after 4:00 p.m. will not be returned until the following business day.  For prescription refill requests, have your pharmacy contact our office and allow 72 hours.    Cancer Center Support Programs:   > Cancer Support Group  2nd Tuesday of the month 1pm-2pm, Journey Room

## 2019-09-11 LAB — PROTEIN ELECTROPHORESIS, SERUM
A/G Ratio: 1.7 (ref 0.7–1.7)
Albumin ELP: 4 g/dL (ref 2.9–4.4)
Alpha-1-Globulin: 0.2 g/dL (ref 0.0–0.4)
Alpha-2-Globulin: 0.7 g/dL (ref 0.4–1.0)
Beta Globulin: 0.9 g/dL (ref 0.7–1.3)
Gamma Globulin: 0.6 g/dL (ref 0.4–1.8)
Globulin, Total: 2.4 g/dL (ref 2.2–3.9)
Total Protein ELP: 6.4 g/dL (ref 6.0–8.5)

## 2019-09-11 LAB — KAPPA/LAMBDA LIGHT CHAINS
Kappa free light chain: 26 mg/L — ABNORMAL HIGH (ref 3.3–19.4)
Kappa, lambda light chain ratio: 1.14 (ref 0.26–1.65)
Lambda free light chains: 22.9 mg/L (ref 5.7–26.3)

## 2019-09-15 ENCOUNTER — Encounter (HOSPITAL_COMMUNITY): Payer: Self-pay

## 2019-09-15 ENCOUNTER — Inpatient Hospital Stay (HOSPITAL_COMMUNITY): Payer: Medicare Other

## 2019-09-15 ENCOUNTER — Other Ambulatory Visit: Payer: Self-pay

## 2019-09-15 ENCOUNTER — Ambulatory Visit (HOSPITAL_COMMUNITY)
Admission: RE | Admit: 2019-09-15 | Discharge: 2019-09-15 | Disposition: A | Discharge status: Home / Self Care | Payer: Medicare Other | Source: Ambulatory Visit | Attending: Hematology | Admitting: Hematology

## 2019-09-15 VITALS — BP 139/62 | HR 57 | Temp 96.9°F | Resp 18

## 2019-09-15 DIAGNOSIS — C9000 Multiple myeloma not having achieved remission: Secondary | ICD-10-CM

## 2019-09-15 DIAGNOSIS — M7989 Other specified soft tissue disorders: Secondary | ICD-10-CM | POA: Diagnosis present

## 2019-09-15 MED ORDER — SODIUM CHLORIDE 0.9% FLUSH
10.0000 mL | INTRAVENOUS | Status: DC | PRN
  Administered 2019-09-15: 10 mL via INTRAVENOUS

## 2019-09-15 MED ORDER — SODIUM CHLORIDE 0.9 % IV SOLN: INTRAVENOUS | Status: DC

## 2019-09-15 MED ORDER — HEPARIN SOD (PORK) LOCK FLUSH 100 UNIT/ML IV SOLN
500.0000 [IU] | Freq: Once | INTRAVENOUS | Status: AC
  Administered 2019-09-15: 500 [IU] via INTRAVENOUS

## 2019-09-15 MED ORDER — SODIUM CHLORIDE 0.9 % IV SOLN
510.0000 mg | Freq: Once | INTRAVENOUS | Status: AC
  Administered 2019-09-15: 510 mg via INTRAVENOUS
  Filled 2019-09-15: qty 510

## 2019-09-15 NOTE — Patient Instructions (Signed)
[facility] at [facility] Discharge Instructions  Received Feraheme infusion today. Follow-up as scheduled. Call clinic for any questions or concerns   Thank you for choosing  [facility] at [facility] to provide your oncology and hematology care.  To afford each patient quality time with our provider, please arrive at least 15 minutes before your scheduled appointment time.   If you have a lab appointment with the Cancer Center please come in thru the Main Entrance and check in at the main information desk.  You need to re-schedule your appointment should you arrive 10 or more minutes late.  We strive to give you quality time with our providers, and arriving late affects you and other patients whose appointments are after yours.  Also, if you no show three or more times for appointments you may be dismissed from the clinic at the providers discretion.     Again, thank you for choosing [facility].  Our hope is that these requests will decrease the amount of time that you wait before being seen by our physicians.       _____________________________________________________________  Should you have questions after your visit to [facility], please contact our office at [phone] between the hours of 8:00 a.m. and 4:30 p.m.  Voicemails left after 4:00 p.m. will not be returned until the following business day.  For prescription refill requests, have your pharmacy contact our office and allow 72 hours.    Due to Covid, you will need to wear a mask upon entering the hospital. If you do not have a mask, a mask will be given to you at the Main Entrance upon arrival. For doctor visits, patients may have 1 support person with them. For treatment visits, patients can not have anyone with them due to social distancing guidelines and our immunocompromised population.     

## 2019-09-15 NOTE — Progress Notes (Signed)
Alexandra Price tolerated Feraheme infusion well without complaints or incident. VSS upon discharge. Pt discharged self ambulatory in satisfactory condition

## 2019-09-16 MED ORDER — LANREOTIDE ACETATE 120 MG/0.5ML ~~LOC~~ SOLN
SUBCUTANEOUS | Status: AC
  Filled 2019-09-16: qty 120

## 2019-09-16 MED ORDER — FULVESTRANT 250 MG/5ML IM SOLN
INTRAMUSCULAR | Status: AC
  Filled 2019-09-16: qty 5

## 2019-09-17 ENCOUNTER — Other Ambulatory Visit (HOSPITAL_COMMUNITY): Payer: Self-pay | Admitting: *Deleted

## 2019-09-17 MED ORDER — LANREOTIDE ACETATE 120 MG/0.5ML ~~LOC~~ SOLN
SUBCUTANEOUS | Status: AC
  Filled 2019-09-17: qty 120

## 2019-09-20 NOTE — Assessment & Plan Note (Signed)
1.  IgG lambda plasma cell myeloma, stage II, standard risk: - Cycle 1 KCyD on 04/17/2018, cycle 2 KRD with extensive skin rash. - Cycle 3 and 4 completed on 08/21/2018 with low-dose pomalidomide 2 mg. -Auto stem cell transplant on 10/23/2018. -PET scan on 01/21/2019 shows multiple bone lesions but without any hypermetabolic activity. -Bone marrow biopsy on 01/21/2019 shows normocellular trilineage hematopoiesis with maturation, no increase in blasts.  No increase in plasma cells (less than 1% by CD138 IHC).  Normal cytogenetics and FISH negative for myeloma.  MRD results are pending from [facility]. -Myeloma labs on 02/12/2019 shows negative SPEP.  Free light chain ratio was 1.41 with kappa light chains 15.7.  Immunofixation was positive for IgG lambda monoclonal protein. - Maintenance therapy with pomalidomide 2 mg 3 weeks on 1 week off started on 03/10/2019. -She is continuing to tolerate pomalidomide very well. -We reviewed myeloma panel which showed no M spike.  Immunofixation was negative.  Free light chains are ranging between 26 and 27.  Ratio is 1.42.  LDH was normal. -No sign of recurrence of myeloma at this time.  She will continue pomalidomide as planned maintenance. -We will reevaluate her in 1 month.   2.  Back pain: -She will continue hydrocodone 7.5 mg as needed.  3.  Bone strengthening: -She had exposure of bone in the lower jaw. -We are holding Xgeva.  Last dose was in June 2020.  4.  Anxiety: -She will continue Xanax 0.5 mg twice daily as needed.  5.  Left leg swelling: -She reported left leg swelling which is recent onset.  There is mild erythema. -I have done a Doppler which was negative for DVT.  However it showed subcutaneous hemorrhage in the left medial calf area.  6.  Normocytic anemia: -Hemoglobin was 8.7.  Previous ferritin was 221 and percent saturation was 16. -U20 and folic acid were normal. -Anemia from combination of CKD and relative iron deficiency.-I have talked to  her about initiating her on Feraheme weekly x2.  We talked about the side effects including chance of anaphylactic reactions.  She will proceed with the infusion.

## 2019-09-21 MED ORDER — HYDROCODONE-ACETAMINOPHEN 7.5-325 MG PO TABS: 1.0000 | ORAL_TABLET | Freq: Four times a day (QID) | ORAL | 0 refills | Status: DC | PRN

## 2019-09-22 ENCOUNTER — Inpatient Hospital Stay (HOSPITAL_COMMUNITY): Payer: Medicare Other | Attending: Hematology

## 2019-09-22 ENCOUNTER — Other Ambulatory Visit: Payer: Self-pay

## 2019-09-22 VITALS — BP 131/59 | HR 61 | Temp 97.4°F | Resp 18

## 2019-09-22 DIAGNOSIS — C9000 Multiple myeloma not having achieved remission: Secondary | ICD-10-CM | POA: Diagnosis present

## 2019-09-22 DIAGNOSIS — D649 Anemia, unspecified: Secondary | ICD-10-CM | POA: Diagnosis not present

## 2019-09-22 MED ORDER — SODIUM CHLORIDE 0.9 % IV SOLN
510.0000 mg | Freq: Once | INTRAVENOUS | Status: AC
  Administered 2019-09-22: 510 mg via INTRAVENOUS
  Filled 2019-09-22: qty 510

## 2019-09-22 MED ORDER — SODIUM CHLORIDE 0.9 % IV SOLN: INTRAVENOUS | Status: DC

## 2019-09-22 MED ORDER — HEPARIN SOD (PORK) LOCK FLUSH 100 UNIT/ML IV SOLN
500.0000 [IU] | Freq: Once | INTRAVENOUS | Status: AC
  Administered 2019-09-22: 500 [IU] via INTRAVENOUS

## 2019-09-22 NOTE — Progress Notes (Signed)
Alexandra Price presents today for IV iron infusion. Infusion tolerated without incident or complaint. See MAR for details. VSS prior to and post infusion. Discharged in satisfactory condition with follow up instructions.

## 2019-09-22 NOTE — Patient Instructions (Signed)
[place] at [facility]  Discharge Instructions:  IV iron received today. Follow up as previously scheduled. _______________________________________________________________  Thank you for choosing [place] at [facility] to provide your oncology and hematology care.  To afford each patient quality time with our providers, please arrive at least 15 minutes before your scheduled appointment.  You need to re-schedule your appointment if you arrive 10 or more minutes late.  We strive to give you quality time with our providers, and arriving late affects you and other patients whose appointments are after yours.  Also, if you no show three or more times for appointments you may be dismissed from the clinic.  Again, thank you for choosing [place] at [facility] hope is that these requests will allow you access to exceptional care and in a timely manner. _______________________________________________________________  If you have questions after your visit, please contact our office at [phone] between the hours of 8:30 a.m. and 5:00 p.m. Voicemails left after 4:30 p.m. will not be returned until the following business day. _______________________________________________________________  For prescription refill requests, have your pharmacy contact our office. _______________________________________________________________  Recommendations made by the consultant and any test results will be sent to your referring physician. _______________________________________________________________

## 2019-09-23 MED ORDER — LANREOTIDE ACETATE 120 MG/0.5ML ~~LOC~~ SOLN
SUBCUTANEOUS | Status: AC
  Filled 2019-09-23: qty 120

## 2019-09-28 ENCOUNTER — Other Ambulatory Visit (HOSPITAL_COMMUNITY): Payer: Self-pay | Admitting: *Deleted

## 2019-09-28 DIAGNOSIS — F32A Depression, unspecified: Secondary | ICD-10-CM

## 2019-09-28 DIAGNOSIS — F329 Major depressive disorder, single episode, unspecified: Secondary | ICD-10-CM

## 2019-09-28 DIAGNOSIS — G479 Sleep disorder, unspecified: Secondary | ICD-10-CM

## 2019-09-28 MED ORDER — CITALOPRAM HYDROBROMIDE 40 MG PO TABS: 40.0000 mg | ORAL_TABLET | Freq: Every day | ORAL | 1 refills | Status: DC

## 2019-09-28 MED ORDER — ZOLPIDEM TARTRATE 10 MG PO TABS: 10.0000 mg | ORAL_TABLET | Freq: Every evening | ORAL | 2 refills | Status: DC | PRN

## 2019-10-01 ENCOUNTER — Other Ambulatory Visit (HOSPITAL_COMMUNITY): Payer: Self-pay | Admitting: Hematology

## 2019-10-01 DIAGNOSIS — C9000 Multiple myeloma not having achieved remission: Secondary | ICD-10-CM

## 2019-10-14 ENCOUNTER — Other Ambulatory Visit (HOSPITAL_COMMUNITY): Payer: Self-pay | Admitting: *Deleted

## 2019-10-14 DIAGNOSIS — C9000 Multiple myeloma not having achieved remission: Secondary | ICD-10-CM

## 2019-10-15 ENCOUNTER — Other Ambulatory Visit: Payer: Self-pay

## 2019-10-15 ENCOUNTER — Inpatient Hospital Stay (HOSPITAL_COMMUNITY): Payer: Medicare Other

## 2019-10-15 DIAGNOSIS — C9000 Multiple myeloma not having achieved remission: Secondary | ICD-10-CM

## 2019-10-15 LAB — CBC WITH DIFFERENTIAL/PLATELET
Abs Immature Granulocytes: 0 10*3/uL (ref 0.00–0.07)
Basophils Absolute: 0.1 10*3/uL (ref 0.0–0.1)
Basophils Relative: 2 %
Eosinophils Absolute: 0.3 10*3/uL (ref 0.0–0.5)
Eosinophils Relative: 8 %
HCT: 30.9 % — ABNORMAL LOW (ref 36.0–46.0)
Hemoglobin: 9.9 g/dL — ABNORMAL LOW (ref 12.0–15.0)
Immature Granulocytes: 0 %
Lymphocytes Relative: 36 %
Lymphs Abs: 1.2 10*3/uL (ref 0.7–4.0)
MCH: 33.6 pg (ref 26.0–34.0)
MCHC: 32 g/dL (ref 30.0–36.0)
MCV: 104.7 fL — ABNORMAL HIGH (ref 80.0–100.0)
Monocytes Absolute: 0.5 10*3/uL (ref 0.1–1.0)
Monocytes Relative: 14 %
Neutro Abs: 1.3 10*3/uL — ABNORMAL LOW (ref 1.7–7.7)
Neutrophils Relative %: 40 %
Platelets: 134 10*3/uL — ABNORMAL LOW (ref 150–400)
RBC: 2.95 MIL/uL — ABNORMAL LOW (ref 3.87–5.11)
RDW: 13.2 % (ref 11.5–15.5)
WBC: 3.2 10*3/uL — ABNORMAL LOW (ref 4.0–10.5)
nRBC: 0 % (ref 0.0–0.2)

## 2019-10-15 LAB — COMPREHENSIVE METABOLIC PANEL
ALT: 16 U/L (ref 0–44)
AST: 19 U/L (ref 15–41)
Albumin: 4.5 g/dL (ref 3.5–5.0)
Alkaline Phosphatase: 50 U/L (ref 38–126)
Anion gap: 9 (ref 5–15)
BUN: 28 mg/dL — ABNORMAL HIGH (ref 8–23)
CO2: 27 mmol/L (ref 22–32)
Calcium: 10.1 mg/dL (ref 8.9–10.3)
Chloride: 103 mmol/L (ref 98–111)
Creatinine, Ser: 1.89 mg/dL — ABNORMAL HIGH (ref 0.44–1.00)
GFR calc Af Amer: 30 mL/min — ABNORMAL LOW (ref >60)
GFR calc non Af Amer: 26 mL/min — ABNORMAL LOW (ref >60)
Glucose, Bld: 89 mg/dL (ref 70–99)
Potassium: 5.2 mmol/L — ABNORMAL HIGH (ref 3.5–5.1)
Sodium: 139 mmol/L (ref 135–145)
Total Bilirubin: 0.7 mg/dL (ref 0.3–1.2)
Total Protein: 7.3 g/dL (ref 6.5–8.1)

## 2019-10-15 LAB — LACTATE DEHYDROGENASE: LDH: 137 U/L (ref 98–192)

## 2019-10-16 LAB — KAPPA/LAMBDA LIGHT CHAINS
Kappa free light chain: 22.1 mg/L — ABNORMAL HIGH (ref 3.3–19.4)
Kappa, lambda light chain ratio: 0.94 (ref 0.26–1.65)
Lambda free light chains: 23.6 mg/L (ref 5.7–26.3)

## 2019-10-19 ENCOUNTER — Other Ambulatory Visit (HOSPITAL_COMMUNITY): Payer: Self-pay | Admitting: *Deleted

## 2019-10-19 LAB — PROTEIN ELECTROPHORESIS, SERUM
A/G Ratio: 1.6 (ref 0.7–1.7)
Albumin ELP: 3.9 g/dL (ref 2.9–4.4)
Alpha-1-Globulin: 0.2 g/dL (ref 0.0–0.4)
Alpha-2-Globulin: 0.7 g/dL (ref 0.4–1.0)
Beta Globulin: 0.9 g/dL (ref 0.7–1.3)
Gamma Globulin: 0.7 g/dL (ref 0.4–1.8)
Globulin, Total: 2.5 g/dL (ref 2.2–3.9)
Total Protein ELP: 6.4 g/dL (ref 6.0–8.5)

## 2019-10-19 MED ORDER — HYDROCODONE-ACETAMINOPHEN 7.5-325 MG PO TABS: 1.0000 | ORAL_TABLET | Freq: Four times a day (QID) | ORAL | 0 refills | Status: DC | PRN

## 2019-10-20 ENCOUNTER — Other Ambulatory Visit (HOSPITAL_COMMUNITY): Payer: Self-pay

## 2019-10-20 DIAGNOSIS — C9000 Multiple myeloma not having achieved remission: Secondary | ICD-10-CM

## 2019-10-20 MED ORDER — THERA PO TABS: 1.0000 | ORAL_TABLET | Freq: Every day | ORAL | 5 refills | Status: DC

## 2019-10-22 ENCOUNTER — Other Ambulatory Visit: Payer: Self-pay

## 2019-10-22 ENCOUNTER — Inpatient Hospital Stay (HOSPITAL_COMMUNITY): Payer: Medicare Other | Attending: Hematology | Admitting: Hematology

## 2019-10-22 ENCOUNTER — Encounter (HOSPITAL_COMMUNITY): Payer: Self-pay | Admitting: Hematology

## 2019-10-22 VITALS — BP 130/60 | HR 60 | Temp 97.1°F | Resp 18 | Wt 169.3 lb

## 2019-10-22 DIAGNOSIS — M549 Dorsalgia, unspecified: Secondary | ICD-10-CM | POA: Diagnosis not present

## 2019-10-22 DIAGNOSIS — D631 Anemia in chronic kidney disease: Secondary | ICD-10-CM | POA: Insufficient documentation

## 2019-10-22 DIAGNOSIS — K5900 Constipation, unspecified: Secondary | ICD-10-CM | POA: Diagnosis not present

## 2019-10-22 DIAGNOSIS — F419 Anxiety disorder, unspecified: Secondary | ICD-10-CM | POA: Diagnosis not present

## 2019-10-22 DIAGNOSIS — C9000 Multiple myeloma not having achieved remission: Secondary | ICD-10-CM | POA: Diagnosis present

## 2019-10-22 DIAGNOSIS — N189 Chronic kidney disease, unspecified: Secondary | ICD-10-CM | POA: Insufficient documentation

## 2019-10-22 NOTE — Progress Notes (Signed)
[place],  [id number]   CLINIC:  Medical Oncology/Hematology  PCP:  [facility] [address]  [id number] [phone]   REASON FOR VISIT:  Follow-up for multiple myeloma    BRIEF ONCOLOGIC HISTORY:  Oncology History  Multiple myeloma without remission ([place])  04/15/2018 Initial Diagnosis   Multiple myeloma without remission ([place])   04/17/2018 - 09/17/2018 Chemotherapy   The patient had palonosetron (ALOXI) injection 0.25 mg, 0.25 mg, Intravenous,  Once, 1 of 1 cycle Administration: 0.25 mg (04/17/2018), 0.25 mg (04/24/2018), 0.25 mg (05/01/2018) cyclophosphamide (CYTOXAN) 540 mg in sodium chloride 0.9 % 250 mL chemo infusion, 300 mg/m2 = 540 mg, Intravenous,  Once, 1 of 1 cycle Administration: 540 mg (04/17/2018), 540 mg (04/24/2018), 540 mg (05/01/2018) carfilzomib (KYPROLIS) 36 mg in dextrose 5 % 50 mL chemo infusion, 20 mg/m2 = 36 mg, Intravenous, Once, 5 of 5 cycles Administration: 36 mg (04/17/2018), 36 mg (04/18/2018), 60 mg (04/24/2018), 60 mg (04/25/2018), 60 mg (05/01/2018), 60 mg (05/02/2018), 60 mg (05/15/2018), 60 mg (05/16/2018), 60 mg (06/12/2018), 60 mg (06/13/2018), 60 mg (06/26/2018), 60 mg (06/27/2018), 60 mg (07/03/2018), 60 mg (07/04/2018), 60 mg (07/24/2018), 60 mg (07/25/2018), 60 mg (07/31/2018), 60 mg (08/01/2018), 60 mg (08/08/2018), 60 mg (08/07/2018), 60 mg (08/21/2018), 60 mg (08/22/2018), 60 mg (08/28/2018), 60 mg (08/29/2018), 60 mg (09/04/2018), 60 mg (09/05/2018)  for chemotherapy treatment.       CANCER STAGING: Cancer Staging No matching staging information was found for the patient.   INTERVAL HISTORY:  Alexandra Price 74 y.o. female seen for follow-up of multiple myeloma.  She is taking pomalidomide without any major problems.  She does have some constipation associated with it.  Appetite is 600%.  Energy levels are 75%.  Back pain and left shoulder pain is well controlled with pain medication every  6 hours.  She developed left shoulder pain while she was chopping wood.  REVIEW OF SYSTEMS:  Review of Systems  Constitutional: Positive for fatigue.  Musculoskeletal: Positive for back pain.  Neurological: Positive for dizziness and headaches.  All other systems reviewed and are negative.    PAST MEDICAL/SURGICAL HISTORY:  Past Medical History:  Diagnosis Date   Anxiety    Depression    Hypertension    Multiple myeloma ([place])    multiple myeloma   Past Surgical History:  Procedure Laterality Date   ABDOMINAL HYSTERECTOMY     total   APPENDECTOMY     LAPAROSCOPIC APPENDECTOMY N/A 05/20/2018   Procedure: APPENDECTOMY LAPAROSCOPIC;  Surgeon: [name], MD;  Location: [facility];  Service: General;  Laterality: N/A;   PORTACATH PLACEMENT Right 04/14/2018   Procedure: INSERTION PORT-A-CATH;  Surgeon: [name], MD;  Location: [facility];  Service: General;  Laterality: Right;     SOCIAL HISTORY:  Social History   Socioeconomic History   Marital status: Legally Separated    Spouse name: Not on file   Number of children: 6   Years of education: Not on file   Highest education level: Not on file  Occupational History    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  Tobacco Use   Smoking status: Never Smoker   Smokeless tobacco: Never Used  Substance and Sexual Activity   Alcohol use: Never   Drug use: Not Currently   Sexual activity: Not Currently  Other Topics Concern   Not on file  Social History Narrative   Not on file   Social Determinants of Health  Financial Resource Strain:    Difficulty of Paying Living Expenses: Not on file  Food Insecurity:    Worried About Charity fundraiser in the Last Year: Not on file   [employer] of Food in the Last Year: Not on file  Transportation Needs:    Lack of Transportation (Medical): Not on file   Lack of Transportation (Non-Medical): Not on file  Physical Activity:    Days of Exercise per Week: Not on file     Minutes of Exercise per Session: Not on file  Stress:    Feeling of Stress : Not on file  Social Connections:    Frequency of Communication with Friends and Family: Not on file   Frequency of Social Gatherings with Friends and Family: Not on file   Attends Religious Services: Not on file   Active Member of Clubs or Organizations: Not on file   Attends Archivist Meetings: Not on file   Marital Status: Not on file  Intimate Partner Violence:    Fear of Current or Ex-Partner: Not on file   Emotionally Abused: Not on file   Physically Abused: Not on file   Sexually Abused: Not on file    FAMILY HISTORY:  Family History  Problem Relation Age of Onset   Heart disease Mother    Emphysema Father    Diabetes Sister    Depression Sister    Cancer Brother        liver, lung, and colon   Diabetes Brother     CURRENT MEDICATIONS:  Outpatient Encounter Medications as of 10/22/2019  Medication Sig   acyclovir (ZOVIRAX) 400 MG tablet Take 1 tablet (400 mg total) by mouth 2 (two) times daily.   ALPRAZolam (XANAX) 0.5 MG tablet Take one tablet during the day as needed for anxiety and two tablets at bedtime as needed for anxiety/insomnia (Patient taking differently: Take 0.5 mg by mouth at bedtime. Take one tablet during the day as needed for anxiety and two tablets at bedtime as needed for anxiety/insomnia)   aspirin EC 81 MG tablet Take 81 mg by mouth daily.   Calcium Carb-Cholecalciferol (CALCIUM 1000 + D PO) Take 1,000 mg by mouth daily. Take one tablet daily until next visit with oncologist.   citalopram (CELEXA) 40 MG tablet Take 1 tablet (40 mg total) by mouth daily.   glipiZIDE (GLUCOTROL XL) 5 MG 24 hr tablet Take 1 tablet by mouth once daily with breakfast   Multiple Vitamin (THERA) TABS Take 1 tablet by mouth daily.   pantoprazole (PROTONIX) 40 MG tablet Take 1 tablet (40 mg total) by mouth daily.   POMALYST 2 MG capsule TAKE 1 CAPSULE BY MOUTH  ONCE DAILY FOR 21 DAYS ON AND 7 DAYS OFF   POMALYST 2 MG capsule TAKE 1 CAPSULE BY MOUTH ONCE DAILY FOR 21 DAYS ON AND 7 DAYS OFF   zolpidem (AMBIEN) 10 MG tablet Take 1 tablet (10 mg total) by mouth at bedtime as needed for sleep. (Patient taking differently: Take 10 mg by mouth at bedtime. )   HYDROcodone-acetaminophen (NORCO) 7.5-325 MG tablet Take 1 tablet by mouth every 6 (six) hours as needed for moderate pain. (Patient not taking: Reported on 10/22/2019)   ondansetron (ZOFRAN) 4 MG tablet Take 1 tablet (4 mg total) by mouth every 8 (eight) hours as needed for nausea or vomiting. (Patient not taking: Reported on 05/21/2019)   terbinafine (LAMISIL) 1 % cream Apply 1 application topically 2 (two) times daily. (Patient  not taking: Reported on 08/13/2019)   [DISCONTINUED] prochlorperazine (COMPAZINE) 10 MG tablet Take 1 tablet (10 mg total) by mouth every 6 (six) hours as needed (Nausea or vomiting).   No facility-administered encounter medications on file as of 10/22/2019.    ALLERGIES:  Allergies  Allergen Reactions   Ciprofloxacin Anaphylaxis   Amoxicillin    Morphine And Related Nausea And Vomiting   Augmentin [Amoxicillin-Pot Clavulanate] Other (See Comments)    Headache, insomnia     PHYSICAL EXAM:  ECOG Performance status: 1  Vitals:   10/22/19 1506  BP: 130/60  Pulse: 60  Resp: 18  Temp: (!) 97.1 F (36.2 C)  SpO2: 100%   Filed Weights   10/22/19 1506  Weight: 169 lb 4.8 oz (76.8 kg)    Physical Exam Vitals reviewed.  Constitutional:      Appearance: Normal appearance.  Cardiovascular:     Rate and Rhythm: Normal rate and regular rhythm.     Heart sounds: Normal heart sounds.  Pulmonary:     Effort: Pulmonary effort is normal.     Breath sounds: Normal breath sounds.  Abdominal:     General: There is no distension.     Palpations: Abdomen is soft. There is no mass.  Musculoskeletal:        General: No swelling.  Skin:    General: Skin is warm.    Neurological:     Mental Status: She is alert and oriented to person, place, and time.  Psychiatric:        Mood and Affect: Mood normal.        Behavior: Behavior normal.      LABORATORY DATA:  I have reviewed the labs as listed.  CBC    Component Value Date/Time   WBC 3.2 (L) 10/15/2019 1310   RBC 2.95 (L) 10/15/2019 1310   HGB 9.9 (L) 10/15/2019 1310   HGB 9.4 (L) 12/15/2018 1244   HCT 30.9 (L) 10/15/2019 1310   PLT 134 (L) 10/15/2019 1310   MCV 104.7 (H) 10/15/2019 1310   MCH 33.6 10/15/2019 1310   MCHC 32.0 10/15/2019 1310   RDW 13.2 10/15/2019 1310   LYMPHSABS 1.2 10/15/2019 1310   MONOABS 0.5 10/15/2019 1310   EOSABS 0.3 10/15/2019 1310   BASOSABS 0.1 10/15/2019 1310   CMP Latest Ref Rng & Units 10/15/2019 09/10/2019 08/13/2019  Glucose 70 - 99 mg/dL 89 76 109(H)  BUN 8 - 23 mg/dL 28(H) 25(H) 26(H)  Creatinine 0.44 - 1.00 mg/dL 1.89(H) 1.68(H) 1.82(H)  Sodium 135 - 145 mmol/L 139 137 141  Potassium 3.5 - 5.1 mmol/L 5.2(H) 4.2 4.2  Chloride 98 - 111 mmol/L 103 102 103  CO2 22 - 32 mmol/L 27 26 27   Calcium 8.9 - 10.3 mg/dL 10.1 8.8(L) 9.0  Total Protein 6.5 - 8.1 g/dL 7.3 6.8 7.0  Total Bilirubin 0.3 - 1.2 mg/dL 0.7 0.4 0.7  Alkaline Phos 38 - 126 U/L 50 43 52  AST 15 - 41 U/L 19 19 17   ALT 0 - 44 U/L 16 15 15        DIAGNOSTIC IMAGING:  I have independently reviewed the scans.   I have reviewed Venita Lick LPN's note and agree with the documentation.  I personally performed a face-to-face visit, made revisions and my assessment and plan is as follows.    ASSESSMENT & PLAN:   Multiple myeloma without remission (H[facility]) 1.  IgG lambda plasma cell myeloma, stage II, standard risk: -Induction with 4 cycles of KPD  from 04/17/2018 through 08/21/2018, stem cell transplant on 10/23/2018. -PET scan on 01/21/2019 showed multiple bone lesions but without any hypermetabolic activity. -Bone marrow biopsy on 01/21/2019 with normocellular marrow.  No increase in plasma  cells.  Normal FISH.  MRD results negative. -Maintenance therapy with pomalidomide 2 mg 3 weeks on 1 week off started on 03/10/2019. -We reviewed myeloma panel from 10/15/2019.  M spike is negative.  Free light chain ratio is normal at 0.94.  Kappa light chains are 22.1 and lambda light chains are 23.6. -CBC shows low white count of 3.2 with ANC of 1300.  Mildly low platelet count of 134.  Creatinine is 1.89 and stable.  Calcium is 10.1. -She does have a bone marrow biopsy scheduled at [facility] on 10/29/2019.  We will see her back in 4 to 6 weeks for follow-up.  2.  Back pain: -She is taking hydrocodone 7.5 mg every 6 hours as needed. -She developed left shoulder pain as she was chopping wood.  3.  Bone strengthening: -We are holding Xgeva at this time.  Last dose was in June 2020.  She had exposure of the bone in the right lower jaw molar area. -She is continuing calcium 900 mg plus vitamin D daily.  Calcium today is 10.1.  4.  Normocytic anemia: -This is from combination of CKD and myelosuppression from pomalidomide. -Feraheme was on 126 2021-2 09/2019. -Hemoglobin improved from 8.7-9.9 today.  5.  Anxiety: -She is continuing Xanax 0.5 mg twice daily as needed.    Orders placed this encounter:  Orders Placed This Encounter  Procedures   Iron and TIBC   Ferritin   Vitamin B12   Folate   Protein electrophoresis, serum   Kappa/lambda light chains   Lactate dehydrogenase   CBC with Differential/Platelet   Comprehensive metabolic panel   Immunofixation electrophoresis      [name], MD [place] [phone]

## 2019-10-22 NOTE — Patient Instructions (Addendum)
[place] at [facility] Discharge Instructions  You were seen today by Dr. Delton Coombes. He went over your recent lab results. He will continue to [employer] injection. Continue taking the Pomalyst as directed. He will see you back in 6 weeks for labs and follow up.   Thank you for choosing [place] at [facility] to provide your oncology and hematology care.  To afford each patient quality time with our provider, please arrive at least 15 minutes before your scheduled appointment time.   If you have a lab appointment with the [facility] please come in thru the  Main Entrance and check in at the main information desk  You need to re-schedule your appointment should you arrive 10 or more minutes late.  We strive to give you quality time with our providers, and arriving late affects you and other patients whose appointments are after yours.  Also, if you no show three or more times for appointments you may be dismissed from the clinic at the providers discretion.     Again, thank you for choosing [facility].  Our hope is that these requests will decrease the amount of time that you wait before being seen by our physicians.       _____________________________________________________________  Should you have questions after your visit to [facility], please contact our office at [phone] between the hours of 8:00 a.m. and 4:30 p.m.  Voicemails left after 4:00 p.m. will not be returned until the following business day.  For prescription refill requests, have your pharmacy contact our office and allow 72 hours.    Cancer Center Support Programs:   > Cancer Support Group  2nd Tuesday of the month 1pm-2pm, Journey Room

## 2019-10-24 NOTE — Assessment & Plan Note (Signed)
1.  IgG lambda plasma cell myeloma, stage II, standard risk: -Induction with 4 cycles of KPD from 04/17/2018 through 08/21/2018, stem cell transplant on 10/23/2018. -PET scan on 01/21/2019 showed multiple bone lesions but without any hypermetabolic activity. -Bone marrow biopsy on 01/21/2019 with normocellular marrow.  No increase in plasma cells.  Normal FISH.  MRD results negative. -Maintenance therapy with pomalidomide 2 mg 3 weeks on 1 week off started on 03/10/2019. -We reviewed myeloma panel from 10/15/2019.  M spike is negative.  Free light chain ratio is normal at 0.94.  Kappa light chains are 22.1 and lambda light chains are 23.6. -CBC shows low white count of 3.2 with ANC of 1300.  Mildly low platelet count of 134.  Creatinine is 1.89 and stable.  Calcium is 10.1. -Alexandra Price does have a bone marrow biopsy scheduled at [facility] on 10/29/2019.  We will see her back in 4 to 6 weeks for follow-up.  2.  Back pain: -Alexandra Price is taking hydrocodone 7.5 mg every 6 hours as needed. -Alexandra Price developed left shoulder pain as Alexandra Price was chopping wood.  3.  Bone strengthening: -We are holding Xgeva at this time.  Last dose was in June 2020.  Alexandra Price had exposure of the bone in the right lower jaw molar area. -Alexandra Price is continuing calcium 900 mg plus vitamin D daily.  Calcium today is 10.1.  4.  Normocytic anemia: -This is from combination of CKD and myelosuppression from pomalidomide. -Feraheme was on 126 2021-2 09/2019. -Hemoglobin improved from 8.7-9.9 today.  5.  Anxiety: -Alexandra Price is continuing Xanax 0.5 mg twice daily as needed.

## 2019-11-02 ENCOUNTER — Encounter (HOSPITAL_COMMUNITY): Payer: Self-pay | Admitting: *Deleted

## 2019-11-02 NOTE — Progress Notes (Signed)
Patient called clinic today to advise that she fell last Thursday.  She hit her face on the floor.  She states that she did not lose consciousness.  She describes a bruised face and gash above her left eye.  I advised her to continue using ice for the swelling.  I told her the bruising should look worse before it looks better. I advised that if she develops any changes to seek care.  She verbalizes understanding.

## 2019-11-04 ENCOUNTER — Other Ambulatory Visit (HOSPITAL_COMMUNITY): Payer: Self-pay | Admitting: Hematology

## 2019-11-04 DIAGNOSIS — C9000 Multiple myeloma not having achieved remission: Secondary | ICD-10-CM

## 2019-11-16 ENCOUNTER — Other Ambulatory Visit (HOSPITAL_COMMUNITY): Payer: Self-pay | Admitting: *Deleted

## 2019-11-16 DIAGNOSIS — C9000 Multiple myeloma not having achieved remission: Secondary | ICD-10-CM

## 2019-11-16 DIAGNOSIS — R42 Dizziness and giddiness: Secondary | ICD-10-CM

## 2019-11-16 MED ORDER — PANTOPRAZOLE SODIUM 40 MG PO TBEC: 40.0000 mg | DELAYED_RELEASE_TABLET | Freq: Every day | ORAL | 2 refills | Status: DC

## 2019-11-16 MED ORDER — THERA PO TABS: 1.0000 | ORAL_TABLET | Freq: Every day | ORAL | 5 refills | Status: DC

## 2019-11-16 MED ORDER — HYDROCODONE-ACETAMINOPHEN 7.5-325 MG PO TABS: 1.0000 | ORAL_TABLET | Freq: Four times a day (QID) | ORAL | 0 refills | Status: DC | PRN

## 2019-11-16 MED ORDER — ACYCLOVIR 400 MG PO TABS: 400.0000 mg | ORAL_TABLET | Freq: Two times a day (BID) | ORAL | 4 refills | Status: DC

## 2019-11-26 ENCOUNTER — Inpatient Hospital Stay (HOSPITAL_COMMUNITY): Payer: Medicare Other | Attending: Hematology

## 2019-11-26 ENCOUNTER — Other Ambulatory Visit: Payer: Self-pay

## 2019-11-26 DIAGNOSIS — N189 Chronic kidney disease, unspecified: Secondary | ICD-10-CM | POA: Insufficient documentation

## 2019-11-26 DIAGNOSIS — D631 Anemia in chronic kidney disease: Secondary | ICD-10-CM | POA: Insufficient documentation

## 2019-11-26 DIAGNOSIS — F419 Anxiety disorder, unspecified: Secondary | ICD-10-CM | POA: Diagnosis not present

## 2019-11-26 DIAGNOSIS — C9000 Multiple myeloma not having achieved remission: Secondary | ICD-10-CM

## 2019-11-26 LAB — COMPREHENSIVE METABOLIC PANEL
ALT: 14 U/L (ref 0–44)
AST: 16 U/L (ref 15–41)
Albumin: 3.9 g/dL (ref 3.5–5.0)
Alkaline Phosphatase: 57 U/L (ref 38–126)
Anion gap: 10 (ref 5–15)
BUN: 24 mg/dL — ABNORMAL HIGH (ref 8–23)
CO2: 27 mmol/L (ref 22–32)
Calcium: 9 mg/dL (ref 8.9–10.3)
Chloride: 102 mmol/L (ref 98–111)
Creatinine, Ser: 1.93 mg/dL — ABNORMAL HIGH (ref 0.44–1.00)
GFR calc Af Amer: 30 mL/min — ABNORMAL LOW (ref >60)
GFR calc non Af Amer: 26 mL/min — ABNORMAL LOW (ref >60)
Glucose, Bld: 106 mg/dL — ABNORMAL HIGH (ref 70–99)
Potassium: 4.4 mmol/L (ref 3.5–5.1)
Sodium: 139 mmol/L (ref 135–145)
Total Bilirubin: 0.5 mg/dL (ref 0.3–1.2)
Total Protein: 6.8 g/dL (ref 6.5–8.1)

## 2019-11-26 LAB — CBC WITH DIFFERENTIAL/PLATELET
Abs Immature Granulocytes: 0.02 10*3/uL (ref 0.00–0.07)
Basophils Absolute: 0.1 10*3/uL (ref 0.0–0.1)
Basophils Relative: 2 %
Eosinophils Absolute: 0.3 10*3/uL (ref 0.0–0.5)
Eosinophils Relative: 9 %
HCT: 28.7 % — ABNORMAL LOW (ref 36.0–46.0)
Hemoglobin: 9.1 g/dL — ABNORMAL LOW (ref 12.0–15.0)
Immature Granulocytes: 1 %
Lymphocytes Relative: 22 %
Lymphs Abs: 0.8 10*3/uL (ref 0.7–4.0)
MCH: 33.8 pg (ref 26.0–34.0)
MCHC: 31.7 g/dL (ref 30.0–36.0)
MCV: 106.7 fL — ABNORMAL HIGH (ref 80.0–100.0)
Monocytes Absolute: 0.4 10*3/uL (ref 0.1–1.0)
Monocytes Relative: 10 %
Neutro Abs: 2.2 10*3/uL (ref 1.7–7.7)
Neutrophils Relative %: 56 %
Platelets: 156 10*3/uL (ref 150–400)
RBC: 2.69 MIL/uL — ABNORMAL LOW (ref 3.87–5.11)
RDW: 13.2 % (ref 11.5–15.5)
WBC: 3.8 10*3/uL — ABNORMAL LOW (ref 4.0–10.5)
nRBC: 0 % (ref 0.0–0.2)

## 2019-11-26 LAB — IRON AND TIBC
Iron: 43 ug/dL (ref 28–170)
Saturation Ratios: 20 % (ref 10.4–31.8)
TIBC: 213 ug/dL — ABNORMAL LOW (ref 250–450)
UIBC: 170 ug/dL

## 2019-11-26 LAB — LACTATE DEHYDROGENASE: LDH: 136 U/L (ref 98–192)

## 2019-11-26 LAB — FOLATE: Folate: 44.9 ng/mL (ref >5.9)

## 2019-11-26 LAB — VITAMIN B12: Vitamin B-12: 377 pg/mL (ref 180–914)

## 2019-11-26 LAB — FERRITIN: Ferritin: 430 ng/mL — ABNORMAL HIGH (ref 11–307)

## 2019-11-27 LAB — PROTEIN ELECTROPHORESIS, SERUM
A/G Ratio: 1.1 (ref 0.7–1.7)
Albumin ELP: 3.3 g/dL (ref 2.9–4.4)
Alpha-1-Globulin: 0.3 g/dL (ref 0.0–0.4)
Alpha-2-Globulin: 0.9 g/dL (ref 0.4–1.0)
Beta Globulin: 0.9 g/dL (ref 0.7–1.3)
Gamma Globulin: 0.8 g/dL (ref 0.4–1.8)
Globulin, Total: 2.9 g/dL (ref 2.2–3.9)
Total Protein ELP: 6.2 g/dL (ref 6.0–8.5)

## 2019-11-27 LAB — KAPPA/LAMBDA LIGHT CHAINS
Kappa free light chain: 40.9 mg/L — ABNORMAL HIGH (ref 3.3–19.4)
Kappa, lambda light chain ratio: 1.37 (ref 0.26–1.65)
Lambda free light chains: 29.8 mg/L — ABNORMAL HIGH (ref 5.7–26.3)

## 2019-11-30 ENCOUNTER — Other Ambulatory Visit (HOSPITAL_COMMUNITY): Payer: Self-pay | Admitting: *Deleted

## 2019-11-30 DIAGNOSIS — G479 Sleep disorder, unspecified: Secondary | ICD-10-CM

## 2019-11-30 MED ORDER — ZOLPIDEM TARTRATE 10 MG PO TABS: 10.0000 mg | ORAL_TABLET | Freq: Every evening | ORAL | 2 refills | Status: DC | PRN

## 2019-12-01 ENCOUNTER — Other Ambulatory Visit (HOSPITAL_COMMUNITY): Payer: Self-pay | Admitting: Hematology

## 2019-12-01 DIAGNOSIS — C9000 Multiple myeloma not having achieved remission: Secondary | ICD-10-CM

## 2019-12-03 ENCOUNTER — Encounter (HOSPITAL_COMMUNITY): Payer: Self-pay | Admitting: Hematology

## 2019-12-03 ENCOUNTER — Inpatient Hospital Stay (HOSPITAL_BASED_OUTPATIENT_CLINIC_OR_DEPARTMENT_OTHER): Payer: Medicare Other | Admitting: Hematology

## 2019-12-03 ENCOUNTER — Other Ambulatory Visit (HOSPITAL_COMMUNITY): Payer: Medicare Other

## 2019-12-03 ENCOUNTER — Other Ambulatory Visit: Payer: Self-pay

## 2019-12-03 VITALS — BP 149/75 | HR 56 | Temp 96.4°F | Resp 18 | Wt 170.5 lb

## 2019-12-03 DIAGNOSIS — C9000 Multiple myeloma not having achieved remission: Secondary | ICD-10-CM

## 2019-12-03 DIAGNOSIS — G479 Sleep disorder, unspecified: Secondary | ICD-10-CM

## 2019-12-03 LAB — IMMUNOFIXATION ELECTROPHORESIS
IgA: 67 mg/dL (ref 64–422)
IgG (Immunoglobin G), Serum: 865 mg/dL (ref 586–1602)
IgM (Immunoglobulin M), Srm: 25 mg/dL — ABNORMAL LOW (ref 26–217)
Total Protein ELP: 6.1 g/dL (ref 6.0–8.5)

## 2019-12-03 MED ORDER — ZOLPIDEM TARTRATE 10 MG PO TABS: 10.0000 mg | ORAL_TABLET | Freq: Every evening | ORAL | 2 refills | Status: DC | PRN

## 2019-12-03 NOTE — Assessment & Plan Note (Addendum)
1.  IgG lambda plasma cell myeloma, stage II, standard risk: -Induction with 4 cycles of KPD from 04/17/2018 through 08/21/2018, stem cell transplant on 10/23/2018. -PET scan on 01/21/2019 showed multiple bone lesions but without any hypermetabolic activity. -Bone marrow biopsy on 01/21/2019 with normocellular marrow.  No increase in plasma cells.  Normal FISH.  MRD results negative. -Maintenance therapy with pomalidomide 2 mg 3 weeks on 1 week off started on 03/10/2019. -Myeloma panel from 11/26/2019 shows M spike is 0.  Immunofixation was normal.  Kappa light chains are 40.  Lambda light chains are 29.8 and ratio is 1.37. -I reviewed bone marrow biopsy results from [facility] from 10/29/2019.  Trilineage hematopoiesis with no evidence of plasma cells.  Chromosome analysis and FISH were normal. -Creatinine is 1.93.  She will continue pomalidomide.  We will reevaluate her in 6 weeks for follow-up.  2.  Back pain: -She is taking hydrocodone 7.5 mg every 6 hours as needed.  This is fairly well controlled.  3.  Normocytic anemia: -This is from CKD and myelosuppression from pomalidomide. -Last Feraheme was on 09/22/2019.  Ferritin is 430 and percent saturation is 20. -She is complaining of severe tiredness.  We will schedule her for Feraheme weekly x2.  4.  Bone strengthening: -She had exposure of bone in the right lower jaw molar area.  Hence we are holding denosumab.  She will continue calcium and vitamin D supplements.  5.  Anxiety: -She will continue Xanax 0.5 mg twice daily as needed.  6.  Sleeping difficulty: -I have renewed her Ambien 10 mg which works great for her. 

## 2019-12-03 NOTE — Patient Instructions (Signed)
[place] at [facility] Discharge Instructions  You were seen today by Dr. Delton Coombes. He went over your recent lab results. Your Iron level was low, so he will schedule you for 2 weekly doses of IV Iron. He will see you back in 6 weeks for labs and follow up.   Thank you for choosing [place] at [facility] to provide your oncology and hematology care.  To afford each patient quality time with our provider, please arrive at least 15 minutes before your scheduled appointment time.   If you have a lab appointment with the [facility] please come in thru the  Main Entrance and check in at the main information desk  You need to re-schedule your appointment should you arrive 10 or more minutes late.  We strive to give you quality time with our providers, and arriving late affects you and other patients whose appointments are after yours.  Also, if you no show three or more times for appointments you may be dismissed from the clinic at the providers discretion.     Again, thank you for choosing [facility].  Our hope is that these requests will decrease the amount of time that you wait before being seen by our physicians.       _____________________________________________________________  Should you have questions after your visit to [facility], please contact our office at [phone] between the hours of 8:00 a.m. and 4:30 p.m.  Voicemails left after 4:00 p.m. will not be returned until the following business day.  For prescription refill requests, have your pharmacy contact our office and allow 72 hours.    Cancer Center Support Programs:   > Cancer Support Group  2nd Tuesday of the month 1pm-2pm, Journey Room

## 2019-12-03 NOTE — Progress Notes (Signed)
Alexandra Price, [facility] [address]   CLINIC:  Medical Oncology/Hematology  PCP:  [facility] [address] [place] [address] [phone]   REASON FOR VISIT:  Follow-up for multiple myeloma    BRIEF ONCOLOGIC HISTORY:  Oncology History  Multiple myeloma without remission ([place])  04/15/2018 Initial Diagnosis   Multiple myeloma without remission ([facility])   04/17/2018 - 09/17/2018 Chemotherapy   The patient had palonosetron (ALOXI) injection 0.25 mg, 0.25 mg, Intravenous,  Once, 1 of 1 cycle Administration: 0.25 mg (04/17/2018), 0.25 mg (04/24/2018), 0.25 mg (05/01/2018) cyclophosphamide (CYTOXAN) 540 mg in sodium chloride 0.9 % 250 mL chemo infusion, 300 mg/m2 = 540 mg, Intravenous,  Once, 1 of 1 cycle Administration: 540 mg (04/17/2018), 540 mg (04/24/2018), 540 mg (05/01/2018) carfilzomib (KYPROLIS) 36 mg in dextrose 5 % 50 mL chemo infusion, 20 mg/m2 = 36 mg, Intravenous, Once, 5 of 5 cycles Administration: 36 mg (04/17/2018), 36 mg (04/18/2018), 60 mg (04/24/2018), 60 mg (04/25/2018), 60 mg (05/01/2018), 60 mg (05/02/2018), 60 mg (05/15/2018), 60 mg (05/16/2018), 60 mg (06/12/2018), 60 mg (06/13/2018), 60 mg (06/26/2018), 60 mg (06/27/2018), 60 mg (07/03/2018), 60 mg (07/04/2018), 60 mg (07/24/2018), 60 mg (07/25/2018), 60 mg (07/31/2018), 60 mg (08/01/2018), 60 mg (08/08/2018), 60 mg (08/07/2018), 60 mg (08/21/2018), 60 mg (08/22/2018), 60 mg (08/28/2018), 60 mg (08/29/2018), 60 mg (09/04/2018), 60 mg (09/05/2018)  for chemotherapy treatment.       CANCER STAGING: Cancer Staging No matching staging information was found for the patient.   INTERVAL HISTORY:  Alexandra Price 74 y.o. female seen for follow-up of multiple myeloma.  She is taking pomalidomide without any major problems.  Requests refill for Ambien.  Appetite is 100%.  Energy levels are 50%.  Reports low back pain of 4 out of 10.  This is well controlled with hydrocodone.  Has occasional  headaches and dizziness which is stable.  Anxiety and depression is also stable.  REVIEW OF SYSTEMS:  Review of Systems  Constitutional: Positive for fatigue.  Musculoskeletal: Positive for back pain.  Neurological: Positive for dizziness and headaches.  Psychiatric/Behavioral: Positive for depression. The patient is nervous/anxious.   All other systems reviewed and are negative.    PAST MEDICAL/SURGICAL HISTORY:  Past Medical History:  Diagnosis Date  . Anxiety   . Depression   . Hypertension   . Multiple myeloma ([facility])    multiple myeloma   Past Surgical History:  Procedure Laterality Date  . ABDOMINAL HYSTERECTOMY     total  . APPENDECTOMY    . LAPAROSCOPIC APPENDECTOMY N/A 05/20/2018   Procedure: APPENDECTOMY LAPAROSCOPIC;  Surgeon: [name], MD;  Location: [facility];  Service: General;  Laterality: N/A;  . PORTACATH PLACEMENT Right 04/14/2018   Procedure: INSERTION PORT-A-CATH;  Surgeon: [name], MD;  Location: [facility];  Service: General;  Laterality: Right;     SOCIAL HISTORY:  Social History   Socioeconomic History  . Marital status: Legally Separated    Spouse name: Not on file  . Number of children: 6  . Years of education: Not on file  . Highest education level: Not on file  Occupational History    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  Tobacco Use  . Smoking status: Never Smoker  . Smokeless tobacco: Never Used  Substance and Sexual Activity  . Alcohol use: Never  . Drug use: Not Currently  . Sexual activity: Not Currently  Other Topics Concern  . Not on file  Social History Narrative  .  Not on file   Social Determinants of Health   Financial Resource Strain:   . Difficulty of Paying Living Expenses:   Food Insecurity:   . Worried About Charity fundraiser in the Last Year:   . Arboriculturist in the Last Year:   Transportation Needs:   . Film/video editor (Medical):   Marland Kitchen Lack of Transportation (Non-Medical):   Physical Activity:   .  Days of Exercise per Week:   . Minutes of Exercise per Session:   Stress:   . Feeling of Stress :   Social Connections:   . Frequency of Communication with Friends and Family:   . Frequency of Social Gatherings with Friends and Family:   . Attends Religious Services:   . Active Member of Clubs or Organizations:   . Attends Archivist Meetings:   Marland Kitchen Marital Status:   Intimate Partner Violence:   . Fear of Current or Ex-Partner:   . Emotionally Abused:   Marland Kitchen Physically Abused:   . Sexually Abused:     FAMILY HISTORY:  Family History  Problem Relation Age of Onset  . Heart disease Mother   . Emphysema Father   . Diabetes Sister   . Depression Sister   . Cancer Brother        liver, lung, and colon  . Diabetes Brother     CURRENT MEDICATIONS:  Outpatient Encounter Medications as of 12/03/2019  Medication Sig  . acyclovir (ZOVIRAX) 400 MG tablet Take 1 tablet (400 mg total) by mouth 2 (two) times daily.  Marland Kitchen ALPRAZolam (XANAX) 0.5 MG tablet Take one tablet during the day as needed for anxiety and two tablets at bedtime as needed for anxiety/insomnia (Patient taking differently: Take 0.5 mg by mouth at bedtime. Take one tablet during the day as needed for anxiety and two tablets at bedtime as needed for anxiety/insomnia)  . aspirin EC 81 MG tablet Take 81 mg by mouth daily.  . Calcium Carb-Cholecalciferol (CALCIUM 1000 + D PO) Take 1,000 mg by mouth daily. Take one tablet daily until next visit with oncologist.  . citalopram (CELEXA) 40 MG tablet Take 1 tablet (40 mg total) by mouth daily.  Marland Kitchen glipiZIDE (GLUCOTROL XL) 5 MG 24 hr tablet Take 1 tablet by mouth once daily with breakfast  . Multiple Vitamin (THERA) TABS Take 1 tablet by mouth daily.  . pantoprazole (PROTONIX) 40 MG tablet Take 1 tablet (40 mg total) by mouth daily.  Marland Kitchen POMALYST 2 MG capsule TAKE 1 CAPSULE BY MOUTH ONCE DAILY FOR 21 DAYS ON AND 7 DAYS OFF  . POMALYST 2 MG capsule TAKE 1 CAPSULE BY MOUTH ONCE DAILY  FOR 21 DAYS ON AND 7 DAYS OFF  . HYDROcodone-acetaminophen (NORCO) 7.5-325 MG tablet Take 1 tablet by mouth every 6 (six) hours as needed for moderate pain. (Patient not taking: Reported on 12/03/2019)  . ondansetron (ZOFRAN) 4 MG tablet Take 1 tablet (4 mg total) by mouth every 8 (eight) hours as needed for nausea or vomiting. (Patient not taking: Reported on 05/21/2019)  . terbinafine (LAMISIL) 1 % cream Apply 1 application topically 2 (two) times daily. (Patient not taking: Reported on 08/13/2019)  . zolpidem (AMBIEN) 10 MG tablet Take 1 tablet (10 mg total) by mouth at bedtime as needed for sleep.  . [DISCONTINUED] prochlorperazine (COMPAZINE) 10 MG tablet Take 1 tablet (10 mg total) by mouth every 6 (six) hours as needed (Nausea or vomiting).  . [DISCONTINUED] zolpidem (AMBIEN) 10 MG  tablet Take 1 tablet (10 mg total) by mouth at bedtime as needed for sleep. (Patient not taking: Reported on 12/03/2019)   No facility-administered encounter medications on file as of 12/03/2019.    ALLERGIES:  Allergies  Allergen Reactions  . Ciprofloxacin Anaphylaxis  . Amoxicillin   . Morphine And Related Nausea And Vomiting  . Augmentin [Amoxicillin-Pot Clavulanate] Other (See Comments)    Headache, insomnia     PHYSICAL EXAM:  ECOG Performance status: 1  Vitals:   12/03/19 1514  BP: (!) 149/75  Pulse: (!) 56  Resp: 18  Temp: (!) 96.4 F (35.8 C)  SpO2: 99%   Filed Weights   12/03/19 1514  Weight: 170 lb 8 oz (77.3 kg)    Physical Exam Vitals reviewed.  Constitutional:      Appearance: Normal appearance.  Cardiovascular:     Rate and Rhythm: Normal rate and regular rhythm.     Heart sounds: Normal heart sounds.  Pulmonary:     Effort: Pulmonary effort is normal.     Breath sounds: Normal breath sounds.  Abdominal:     General: There is no distension.     Palpations: Abdomen is soft. There is no mass.  Musculoskeletal:        General: No swelling.  Skin:    General: Skin is  warm.  Neurological:     Mental Status: She is alert and oriented to person, place, and time.  Psychiatric:        Mood and Affect: Mood normal.        Behavior: Behavior normal.      LABORATORY DATA:  I have reviewed the labs as listed.  CBC    Component Value Date/Time   WBC 3.8 (L) 11/26/2019 1153   RBC 2.69 (L) 11/26/2019 1153   HGB 9.1 (L) 11/26/2019 1153   HGB 9.4 (L) 12/15/2018 1244   HCT 28.7 (L) 11/26/2019 1153   PLT 156 11/26/2019 1153   MCV 106.7 (H) 11/26/2019 1153   MCH 33.8 11/26/2019 1153   MCHC 31.7 11/26/2019 1153   RDW 13.2 11/26/2019 1153   LYMPHSABS 0.8 11/26/2019 1153   MONOABS 0.4 11/26/2019 1153   EOSABS 0.3 11/26/2019 1153   BASOSABS 0.1 11/26/2019 1153   CMP Latest Ref Rng & Units 11/26/2019 10/15/2019 09/10/2019  Glucose 70 - 99 mg/dL 106(H) 89 76  BUN 8 - 23 mg/dL 24(H) 28(H) 25(H)  Creatinine 0.44 - 1.00 mg/dL 1.93(H) 1.89(H) 1.68(H)  Sodium 135 - 145 mmol/L 139 139 137  Potassium 3.5 - 5.1 mmol/L 4.4 5.2(H) 4.2  Chloride 98 - 111 mmol/L 102 103 102  CO2 22 - 32 mmol/L 27 27 26   Calcium 8.9 - 10.3 mg/dL 9.0 10.1 8.8(L)  Total Protein 6.5 - 8.1 g/dL 6.8 7.3 6.8  Total Bilirubin 0.3 - 1.2 mg/dL 0.5 0.7 0.4  Alkaline Phos 38 - 126 U/L 57 50 43  AST 15 - 41 U/L 16 19 19   ALT 0 - 44 U/L 14 16 15        DIAGNOSTIC IMAGING:  I have reviewed her scans.   I have reviewed Venita Lick LPN's note and agree with the documentation.  I personally performed a face-to-face visit, made revisions and my assessment and plan is as follows.    ASSESSMENT & PLAN:   Multiple myeloma without remission ([facility]) 1.  IgG lambda plasma cell myeloma, stage II, standard risk: -Induction with 4 cycles of KPD from 04/17/2018 through 08/21/2018, stem cell transplant on 10/23/2018. -PET  scan on 01/21/2019 showed multiple bone lesions but without any hypermetabolic activity. -Bone marrow biopsy on 01/21/2019 with normocellular marrow.  No increase in plasma cells.  Normal  FISH.  MRD results negative. -Maintenance therapy with pomalidomide 2 mg 3 weeks on 1 week off started on 03/10/2019. -Myeloma panel from 11/26/2019 shows M spike is 0.  Immunofixation was normal.  Kappa light chains are 40.  Lambda light chains are 29.8 and ratio is 1.37. -I reviewed bone marrow biopsy results from [facility] from 10/29/2019.  Trilineage hematopoiesis with no evidence of plasma cells.  Chromosome analysis and FISH were normal. -Creatinine is 1.93.  She will continue pomalidomide.  We will reevaluate her in 6 weeks for follow-up.  2.  Back pain: -She is taking hydrocodone 7.5 mg every 6 hours as needed.  This is fairly well controlled.  3.  Normocytic anemia: -This is from CKD and myelosuppression from pomalidomide. -Last Feraheme was on 09/22/2019.  Ferritin is 430 and percent saturation is 20. -She is complaining of severe tiredness.  We will schedule her for Feraheme weekly x2.  4.  Bone strengthening: -She had exposure of bone in the right lower jaw molar area.  Hence we are holding denosumab.  She will continue calcium and vitamin D supplements.  5.  Anxiety: -She will continue Xanax 0.5 mg twice daily as needed.  6.  Sleeping difficulty: -I have renewed her Ambien 10 mg which works great for her.    Orders placed this encounter:  Orders Placed This Encounter  Procedures  . Protein electrophoresis, serum  . Kappa/lambda light chains  . Lactate dehydrogenase  . Iron and TIBC  . Ferritin  . Vitamin B12  . Folate  . CBC with Differential/Platelet  . Comprehensive metabolic panel  . Magnesium  . Lactate dehydrogenase  . Immunofixation electrophoresis      [name], MD [place] [phone]

## 2019-12-04 ENCOUNTER — Other Ambulatory Visit (HOSPITAL_COMMUNITY): Payer: Self-pay | Admitting: *Deleted

## 2019-12-04 DIAGNOSIS — F419 Anxiety disorder, unspecified: Secondary | ICD-10-CM

## 2019-12-04 DIAGNOSIS — C9000 Multiple myeloma not having achieved remission: Secondary | ICD-10-CM

## 2019-12-04 MED ORDER — ALPRAZOLAM 0.5 MG PO TABS: ORAL_TABLET | ORAL | 3 refills | Status: DC

## 2019-12-09 ENCOUNTER — Inpatient Hospital Stay (HOSPITAL_COMMUNITY): Payer: Medicare Other

## 2019-12-09 ENCOUNTER — Other Ambulatory Visit: Payer: Self-pay

## 2019-12-09 VITALS — BP 149/63 | HR 62 | Temp 96.9°F | Resp 18

## 2019-12-09 DIAGNOSIS — C9000 Multiple myeloma not having achieved remission: Secondary | ICD-10-CM

## 2019-12-09 MED ORDER — SODIUM CHLORIDE 0.9 % IV SOLN: Freq: Once | INTRAVENOUS | Status: AC

## 2019-12-09 MED ORDER — SODIUM CHLORIDE 0.9 % IV SOLN
510.0000 mg | Freq: Once | INTRAVENOUS | Status: AC
  Administered 2019-12-09: 510 mg via INTRAVENOUS
  Filled 2019-12-09: qty 510

## 2019-12-09 NOTE — Progress Notes (Signed)
Alexandra Price presents today for IV iron infusion. Infusion tolerated without incident or complaint. See MAR for details. VSS prior to and post infusion. Discharged in satisfactory condition with follow up instructions.

## 2019-12-09 NOTE — Patient Instructions (Signed)
[place] at [facility]  Discharge Instructions:  IV iron received today. Follow up next week as scheduled for dose 2. _______________________________________________________________  Thank you for choosing [place] at [facility] to provide your oncology and hematology care.  To afford each patient quality time with our providers, please arrive at least 15 minutes before your scheduled appointment.  You need to re-schedule your appointment if you arrive 10 or more minutes late.  We strive to give you quality time with our providers, and arriving late affects you and other patients whose appointments are after yours.  Also, if you no show three or more times for appointments you may be dismissed from the clinic.  Again, thank you for choosing [facility] [place] hope is that these requests will allow you access to exceptional care and in a timely manner. _______________________________________________________________  If you have questions after your visit, please contact our office at [phone] between the hours of 8:30 a.m. and 5:00 p.m. Voicemails left after 4:30 p.m. will not be returned until the following business day. _______________________________________________________________  For prescription refill requests, have your pharmacy contact our office. _______________________________________________________________  Recommendations made by the consultant and any test results will be sent to your referring physician. _______________________________________________________________

## 2019-12-11 ENCOUNTER — Other Ambulatory Visit (HOSPITAL_COMMUNITY): Payer: Self-pay | Admitting: Nurse Practitioner

## 2019-12-11 DIAGNOSIS — G479 Sleep disorder, unspecified: Secondary | ICD-10-CM

## 2019-12-11 MED ORDER — TEMAZEPAM 15 MG PO CAPS: 15.0000 mg | ORAL_CAPSULE | Freq: Every evening | ORAL | 0 refills | Status: DC | PRN

## 2019-12-14 ENCOUNTER — Encounter (HOSPITAL_COMMUNITY): Payer: Self-pay | Admitting: *Deleted

## 2019-12-14 NOTE — Progress Notes (Signed)
Patient had her first of two iron infusions last week.  Over the weekend she had some upper abdominal pain and has noticed some brown/red blood in her stools.  She is able to eat and drink and her stools are normal other than color.  I advised patient to not have any spice/fried/fatty foods over the next few days and let her gut rest.  I advised for her to let us know how she is doing over the next few days.  Per Dr. Delton Coombes, if she is not better, we can refer to GI for evaluation.

## 2019-12-16 ENCOUNTER — Other Ambulatory Visit (HOSPITAL_COMMUNITY): Payer: Self-pay | Admitting: *Deleted

## 2019-12-16 MED ORDER — HYDROCODONE-ACETAMINOPHEN 7.5-325 MG PO TABS: 1.0000 | ORAL_TABLET | Freq: Four times a day (QID) | ORAL | 0 refills | Status: DC | PRN

## 2019-12-18 ENCOUNTER — Inpatient Hospital Stay (HOSPITAL_COMMUNITY): Payer: Medicare Other

## 2019-12-18 ENCOUNTER — Other Ambulatory Visit: Payer: Self-pay

## 2019-12-18 VITALS — BP 130/62 | HR 63 | Temp 97.5°F | Resp 18

## 2019-12-18 DIAGNOSIS — C9000 Multiple myeloma not having achieved remission: Secondary | ICD-10-CM

## 2019-12-18 MED ORDER — SODIUM CHLORIDE 0.9 % IV SOLN
510.0000 mg | Freq: Once | INTRAVENOUS | Status: AC
  Administered 2019-12-18: 510 mg via INTRAVENOUS
  Filled 2019-12-18: qty 510

## 2019-12-18 MED ORDER — SODIUM CHLORIDE 0.9 % IV SOLN: Freq: Once | INTRAVENOUS | Status: AC

## 2019-12-18 MED ORDER — HEPARIN SOD (PORK) LOCK FLUSH 100 UNIT/ML IV SOLN
500.0000 [IU] | Freq: Once | INTRAVENOUS | Status: AC
  Administered 2019-12-18: 500 [IU] via INTRAVENOUS

## 2019-12-18 NOTE — Progress Notes (Signed)
Patient tolerated iron infusion with no complaints voiced.  Port site clean and dry with good blood return noted before and after infusion.  Band aid applied.  VSS with discharge and left ambulatory with no s/s of distress noted.

## 2019-12-28 ENCOUNTER — Other Ambulatory Visit (HOSPITAL_COMMUNITY): Payer: Self-pay | Admitting: Hematology

## 2019-12-28 DIAGNOSIS — C9000 Multiple myeloma not having achieved remission: Secondary | ICD-10-CM

## 2020-01-01 ENCOUNTER — Other Ambulatory Visit (HOSPITAL_COMMUNITY): Payer: Self-pay | Admitting: *Deleted

## 2020-01-01 DIAGNOSIS — C9000 Multiple myeloma not having achieved remission: Secondary | ICD-10-CM

## 2020-01-01 MED ORDER — ACYCLOVIR 400 MG PO TABS: 400.0000 mg | ORAL_TABLET | Freq: Two times a day (BID) | ORAL | 4 refills | Status: DC

## 2020-01-07 ENCOUNTER — Inpatient Hospital Stay (HOSPITAL_COMMUNITY): Payer: Medicare Other | Attending: Hematology

## 2020-01-07 ENCOUNTER — Other Ambulatory Visit: Payer: Self-pay

## 2020-01-07 DIAGNOSIS — N189 Chronic kidney disease, unspecified: Secondary | ICD-10-CM | POA: Diagnosis not present

## 2020-01-07 DIAGNOSIS — K5900 Constipation, unspecified: Secondary | ICD-10-CM | POA: Insufficient documentation

## 2020-01-07 DIAGNOSIS — F419 Anxiety disorder, unspecified: Secondary | ICD-10-CM | POA: Insufficient documentation

## 2020-01-07 DIAGNOSIS — C9000 Multiple myeloma not having achieved remission: Secondary | ICD-10-CM | POA: Diagnosis not present

## 2020-01-07 DIAGNOSIS — D631 Anemia in chronic kidney disease: Secondary | ICD-10-CM | POA: Insufficient documentation

## 2020-01-07 LAB — CBC WITH DIFFERENTIAL/PLATELET
Abs Immature Granulocytes: 0 10*3/uL (ref 0.00–0.07)
Basophils Absolute: 0.1 10*3/uL (ref 0.0–0.1)
Basophils Relative: 2 %
Eosinophils Absolute: 0.3 10*3/uL (ref 0.0–0.5)
Eosinophils Relative: 9 %
HCT: 28.9 % — ABNORMAL LOW (ref 36.0–46.0)
Hemoglobin: 9.3 g/dL — ABNORMAL LOW (ref 12.0–15.0)
Immature Granulocytes: 0 %
Lymphocytes Relative: 36 %
Lymphs Abs: 1.2 10*3/uL (ref 0.7–4.0)
MCH: 34.6 pg — ABNORMAL HIGH (ref 26.0–34.0)
MCHC: 32.2 g/dL (ref 30.0–36.0)
MCV: 107.4 fL — ABNORMAL HIGH (ref 80.0–100.0)
Monocytes Absolute: 0.5 10*3/uL (ref 0.1–1.0)
Monocytes Relative: 14 %
Neutro Abs: 1.3 10*3/uL — ABNORMAL LOW (ref 1.7–7.7)
Neutrophils Relative %: 39 %
Platelets: 138 10*3/uL — ABNORMAL LOW (ref 150–400)
RBC: 2.69 MIL/uL — ABNORMAL LOW (ref 3.87–5.11)
RDW: 13.2 % (ref 11.5–15.5)
WBC: 3.3 10*3/uL — ABNORMAL LOW (ref 4.0–10.5)
nRBC: 0 % (ref 0.0–0.2)

## 2020-01-07 LAB — COMPREHENSIVE METABOLIC PANEL
ALT: 13 U/L (ref 0–44)
AST: 17 U/L (ref 15–41)
Albumin: 4 g/dL (ref 3.5–5.0)
Alkaline Phosphatase: 56 U/L (ref 38–126)
Anion gap: 7 (ref 5–15)
BUN: 32 mg/dL — ABNORMAL HIGH (ref 8–23)
CO2: 29 mmol/L (ref 22–32)
Calcium: 9.3 mg/dL (ref 8.9–10.3)
Chloride: 103 mmol/L (ref 98–111)
Creatinine, Ser: 2.21 mg/dL — ABNORMAL HIGH (ref 0.44–1.00)
GFR calc Af Amer: 25 mL/min — ABNORMAL LOW (ref >60)
GFR calc non Af Amer: 22 mL/min — ABNORMAL LOW (ref >60)
Glucose, Bld: 123 mg/dL — ABNORMAL HIGH (ref 70–99)
Potassium: 5 mmol/L (ref 3.5–5.1)
Sodium: 139 mmol/L (ref 135–145)
Total Bilirubin: 0.5 mg/dL (ref 0.3–1.2)
Total Protein: 6.7 g/dL (ref 6.5–8.1)

## 2020-01-07 LAB — FERRITIN: Ferritin: 694 ng/mL — ABNORMAL HIGH (ref 11–307)

## 2020-01-07 LAB — IRON AND TIBC
Iron: 84 ug/dL (ref 28–170)
Saturation Ratios: 36 % — ABNORMAL HIGH (ref 10.4–31.8)
TIBC: 235 ug/dL — ABNORMAL LOW (ref 250–450)
UIBC: 151 ug/dL

## 2020-01-07 LAB — FOLATE: Folate: 51.7 ng/mL (ref >5.9)

## 2020-01-07 LAB — MAGNESIUM: Magnesium: 1.7 mg/dL (ref 1.7–2.4)

## 2020-01-07 LAB — VITAMIN B12: Vitamin B-12: 422 pg/mL (ref 180–914)

## 2020-01-07 LAB — LACTATE DEHYDROGENASE: LDH: 117 U/L (ref 98–192)

## 2020-01-08 LAB — PROTEIN ELECTROPHORESIS, SERUM
A/G Ratio: 1.3 (ref 0.7–1.7)
Albumin ELP: 3.5 g/dL (ref 2.9–4.4)
Alpha-1-Globulin: 0.2 g/dL (ref 0.0–0.4)
Alpha-2-Globulin: 0.7 g/dL (ref 0.4–1.0)
Beta Globulin: 0.9 g/dL (ref 0.7–1.3)
Gamma Globulin: 0.9 g/dL (ref 0.4–1.8)
Globulin, Total: 2.7 g/dL (ref 2.2–3.9)
Total Protein ELP: 6.2 g/dL (ref 6.0–8.5)

## 2020-01-08 LAB — IMMUNOFIXATION ELECTROPHORESIS
IgA: 77 mg/dL (ref 64–422)
IgG (Immunoglobin G), Serum: 937 mg/dL (ref 586–1602)
IgM (Immunoglobulin M), Srm: 19 mg/dL — ABNORMAL LOW (ref 26–217)
Total Protein ELP: 6.3 g/dL (ref 6.0–8.5)

## 2020-01-08 LAB — KAPPA/LAMBDA LIGHT CHAINS
Kappa free light chain: 38.4 mg/L — ABNORMAL HIGH (ref 3.3–19.4)
Kappa, lambda light chain ratio: 1.35 (ref 0.26–1.65)
Lambda free light chains: 28.4 mg/L — ABNORMAL HIGH (ref 5.7–26.3)

## 2020-01-08 MED ORDER — OCTREOTIDE ACETATE 30 MG IM KIT
PACK | INTRAMUSCULAR | Status: AC
  Filled 2020-01-08: qty 1

## 2020-01-14 ENCOUNTER — Encounter (HOSPITAL_COMMUNITY): Payer: Self-pay | Admitting: Hematology

## 2020-01-14 ENCOUNTER — Other Ambulatory Visit (HOSPITAL_COMMUNITY): Payer: Self-pay | Admitting: *Deleted

## 2020-01-14 ENCOUNTER — Inpatient Hospital Stay (HOSPITAL_BASED_OUTPATIENT_CLINIC_OR_DEPARTMENT_OTHER): Payer: Medicare Other | Admitting: Hematology

## 2020-01-14 ENCOUNTER — Other Ambulatory Visit: Payer: Self-pay

## 2020-01-14 VITALS — BP 144/51 | HR 69 | Temp 97.5°F | Resp 17 | Wt 171.1 lb

## 2020-01-14 DIAGNOSIS — C9000 Multiple myeloma not having achieved remission: Secondary | ICD-10-CM

## 2020-01-14 MED ORDER — HYDROCODONE-ACETAMINOPHEN 7.5-325 MG PO TABS: 1.0000 | ORAL_TABLET | Freq: Four times a day (QID) | ORAL | 0 refills | Status: DC | PRN

## 2020-01-14 NOTE — Progress Notes (Signed)
Alexandra Price, Alexandra Price [address]   CLINIC:  Medical Oncology/Hematology  PCP:  The [facility] [facility] / [place] [address]  [phone]  REASON FOR VISIT:  Follow-up for multiple myeloma  PRIOR THERAPY: 4 cycles of KPD from 04/17/2018 through 08/21/2018. -Stem cell transplant on 10/23/2018. -Maintenance pomalidomide started on 03/10/2019.  CURRENT THERAPY: Observation  INTERVAL HISTORY:  [name], a 74 y.o. female, returns for routine follow-up for her multiple myeloma. Alexandra Price was last seen on 09/04/2019.  She is continuing pomalidomide without any major problems.  She does have some constipation.  She continues to have back pain. She is taking her pain medication every 6 hours and will take is around 10 pm when she goes to bed and after she wake up in the morning. She notes that she felt a little better after the iron infusion, but she does still have some fatigue. She notes that she is drinking a lot of fluid.    REVIEW OF SYSTEMS:  Review of Systems  Constitutional: Positive for fatigue.  Cardiovascular: Positive for chest pain (Under breast).  Gastrointestinal: Positive for constipation (After first day of Pom).  Musculoskeletal: Positive for back pain.  All other systems reviewed and are negative.   PAST MEDICAL/SURGICAL HISTORY:  Past Medical History:  Diagnosis Date  . Anxiety   . Depression   . Hypertension   . Multiple myeloma ([place])    multiple myeloma   Past Surgical History:  Procedure Laterality Date  . ABDOMINAL HYSTERECTOMY     total  . APPENDECTOMY    . LAPAROSCOPIC APPENDECTOMY N/A 05/20/2018   Procedure: APPENDECTOMY LAPAROSCOPIC;  Surgeon: [name], MD;  Location: [facility];  Service: General;  Laterality: N/A;  . PORTACATH PLACEMENT Right 04/14/2018   Procedure: INSERTION PORT-A-CATH;  Surgeon: [name], MD;  Location: [facility];  Service: General;  Laterality: Right;     SOCIAL HISTORY:  Social History   Socioeconomic History  . Marital status: Legally Separated    Spouse name: Not on file  . Number of children: 6  . Years of education: Not on file  . Highest education level: Not on file  Occupational History    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  Tobacco Use  . Smoking status: Never Smoker  . Smokeless tobacco: Never Used  Substance and Sexual Activity  . Alcohol use: Never  . Drug use: Not Currently  . Sexual activity: Not Currently  Other Topics Concern  . Not on file  Social History Narrative  . Not on file   Social Determinants of Health   Financial Resource Strain:   . Difficulty of Paying Living Expenses:   Food Insecurity:   . Worried About Charity fundraiser in the Last Year:   . Arboriculturist in the Last Year:   Transportation Needs:   . Film/video editor (Medical):   Marland Kitchen Lack of Transportation (Non-Medical):   Physical Activity:   . Days of Exercise per Week:   . Minutes of Exercise per Session:   Stress:   . Feeling of Stress :   Social Connections:   . Frequency of Communication with Friends and Family:   . Frequency of Social Gatherings with Friends and Family:   . Attends Religious Services:   . Active Member of Clubs or Organizations:   . Attends Archivist Meetings:   Marland Kitchen Marital Status:   Intimate Partner Violence:   .  Fear of Current or Ex-Partner:   . Emotionally Abused:   Marland Kitchen Physically Abused:   . Sexually Abused:     FAMILY HISTORY:  Family History  Problem Relation Age of Onset  . Heart disease Mother   . Emphysema Father   . Diabetes Sister   . Depression Sister   . Cancer Brother        liver, lung, and colon  . Diabetes Brother     CURRENT MEDICATIONS:  Current Outpatient Medications  Medication Sig Dispense Refill  . acyclovir (ZOVIRAX) 400 MG tablet Take 1 tablet (400 mg total) by mouth 2 (two) times daily. 60 tablet 4  . ALPRAZolam (XANAX) 0.5 MG tablet Take one tablet during  the day as needed for anxiety and two tablets at bedtime as needed for anxiety/insomnia 90 tablet 3  . aspirin EC 81 MG tablet Take 81 mg by mouth daily.    . Calcium Carb-Cholecalciferol (CALCIUM 1000 + D PO) Take 1,000 mg by mouth daily. Take one tablet daily until next visit with oncologist.    . citalopram (CELEXA) 40 MG tablet Take 1 tablet (40 mg total) by mouth daily. 90 tablet 1  . glipiZIDE (GLUCOTROL XL) 5 MG 24 hr tablet Take 1 tablet by mouth once daily with breakfast 90 tablet 2  . Multiple Vitamin (THERA) TABS Take 1 tablet by mouth daily. 30 tablet 5  . pantoprazole (PROTONIX) 40 MG tablet Take 1 tablet (40 mg total) by mouth daily. 30 tablet 2  . POMALYST 2 MG capsule TAKE 1 CAPSULE BY MOUTH ONCE DAILY FOR 21 DAYS ON AND 7 DAYS OFF 21 capsule 1  . POMALYST 2 MG capsule TAKE 1 CAPSULE BY MOUTH ONCE DAILY FOR 21 DAYS ON AND 7 DAYS OFF 21 capsule 1  . zolpidem (AMBIEN) 10 MG tablet Take 1 tablet (10 mg total) by mouth at bedtime as needed for sleep. 30 tablet 2  . chlorhexidine (PERIDEX) 0.12 % solution SMARTSIG:15 Milliliter(s) By Mouth Twice a Week    . HYDROcodone-acetaminophen (NORCO) 7.5-325 MG tablet Take 1 tablet by mouth every 6 (six) hours as needed for moderate pain. (Patient not taking: Reported on 01/14/2020) 120 tablet 0  . ondansetron (ZOFRAN) 4 MG tablet Take 1 tablet (4 mg total) by mouth every 8 (eight) hours as needed for nausea or vomiting. (Patient not taking: Reported on 05/21/2019) 20 tablet 1  . terbinafine (LAMISIL) 1 % cream Apply 1 application topically 2 (two) times daily. (Patient not taking: Reported on 08/13/2019) 30 g 0   No current facility-administered medications for this visit.    ALLERGIES:  Allergies  Allergen Reactions  . Ciprofloxacin Anaphylaxis  . Amoxicillin   . Morphine And Related Nausea And Vomiting  . Augmentin [Amoxicillin-Pot Clavulanate] Other (See Comments)    Headache, insomnia    PHYSICAL EXAM:  Performance status (ECOG): 1 -  Symptomatic but completely ambulatory  Vitals:   01/14/20 1506  BP: (!) 144/51  Pulse: 69  Resp: 17  Temp: (!) 97.5 F (36.4 C)  SpO2: 97%   Wt Readings from Last 3 Encounters:  01/14/20 171 lb 1.6 oz (77.6 kg)  12/03/19 170 lb 8 oz (77.3 kg)  10/22/19 169 lb 4.8 oz (76.8 kg)   Physical Exam Vitals reviewed.  Cardiovascular:     Rate and Rhythm: Normal rate and regular rhythm.     Heart sounds: Normal heart sounds.  Pulmonary:     Effort: Pulmonary effort is normal.     Breath  sounds: Normal breath sounds.  Abdominal:     Palpations: Abdomen is soft.  Lymphadenopathy:     Cervical: No cervical adenopathy.  Skin:    General: Skin is warm.  Neurological:     General: No focal deficit present.     Mental Status: She is oriented to person, place, and time.  Psychiatric:        Mood and Affect: Mood normal.        Behavior: Behavior normal.    LABORATORY DATA:  I have reviewed the labs as listed.  CBC Latest Ref Rng & Units 01/07/2020 11/26/2019 10/15/2019  WBC 4.0 - 10.5 K/uL 3.3(L) 3.8(L) 3.2(L)  Hemoglobin 12.0 - 15.0 g/dL 9.3(L) 9.1(L) 9.9(L)  Hematocrit 36.0 - 46.0 % 28.9(L) 28.7(L) 30.9(L)  Platelets 150 - 400 K/uL 138(L) 156 134(L)   CMP Latest Ref Rng & Units 01/07/2020 11/26/2019 10/15/2019  Glucose 70 - 99 mg/dL 123(H) 106(H) 89  BUN 8 - 23 mg/dL 32(H) 24(H) 28(H)  Creatinine 0.44 - 1.00 mg/dL 2.21(H) 1.93(H) 1.89(H)  Sodium 135 - 145 mmol/L 139 139 139  Potassium 3.5 - 5.1 mmol/L 5.0 4.4 5.2(H)  Chloride 98 - 111 mmol/L 103 102 103  CO2 22 - 32 mmol/L 29 27 27   Calcium 8.9 - 10.3 mg/dL 9.3 9.0 10.1  Total Protein 6.5 - 8.1 g/dL 6.7 6.8 7.3  Total Bilirubin 0.3 - 1.2 mg/dL 0.5 0.5 0.7  Alkaline Phos 38 - 126 U/L 56 57 50  AST 15 - 41 U/L 17 16 19   ALT 0 - 44 U/L 13 14 16       Component Value Date/Time   RBC 2.69 (L) 01/07/2020 1342   MCV 107.4 (H) 01/07/2020 1342   MCH 34.6 (H) 01/07/2020 1342   MCHC 32.2 01/07/2020 1342   RDW 13.2 01/07/2020 1342    LYMPHSABS 1.2 01/07/2020 1342   MONOABS 0.5 01/07/2020 1342   EOSABS 0.3 01/07/2020 1342   BASOSABS 0.1 01/07/2020 1342   Lab Results  Component Value Date   TOTALPROTELP 6.2 01/07/2020   TOTALPROTELP 6.3 01/07/2020   ALBUMINELP 3.5 01/07/2020   A1GS 0.2 01/07/2020   A2GS 0.7 01/07/2020   BETS 0.9 01/07/2020   GAMS 0.9 01/07/2020   MSPIKE Not Observed 01/07/2020   SPEI Comment 01/07/2020   Lab Results  Component Value Date   KPAFRELGTCHN 38.4 (H) 01/07/2020   LAMBDASER 28.4 (H) 01/07/2020   KAPLAMBRATIO 1.35 01/07/2020     DIAGNOSTIC IMAGING:  I have independently reviewed the scans and discussed with the patient. No results found.    ASSESSMENT:  1.  IgG lambda plasma cell myeloma, stage II, standard risk: -4 cycles of KPD from 04/17/2018 through 08/21/2018, stem cell transplant on 10/23/2018. -PET scan on 01/21/2019 showed multiple bone lesions but without any hypermetabolic activity. -BMBX on 01/21/2019 with normocellular marrow with no increase in plasma cells.  Normal FISH.  MRD results negative. -Maintenance pomalidomide 2 mg 3 weeks on/1 week off started on 03/10/2019. -Bone marrow biopsy on 10/29/2019 shows trilineage hematopoiesis with no evidence of plasma cells.  Chromosome analysis and FISH are normal.    PLAN:  1.  IgG lambda plasma cell myeloma: -Reviewed myeloma labs from 01/07/2020.  SPEP and immunofixation was normal. -Free lambda light chains at 28.4, kappa light chains 38.4 with ratio of 1.35. -She will continue pomalidomide maintenance. -I plan to see her back in 2 months for follow-up.  She has an appointment at [facility] in July.  2.  Back pain: -  She has some back pain and right lower rib pain for which she takes hydrocodone 7.5 mg every 6 hours as needed which is helping.  3.  Myeloma bone disease: -Exposure of bone in the right lower jaw molar area.  Holding denosumab indefinitely.  Continue calcium and vitamin D.  4.  Anxiety: -Continue Xanax 0.5  mg twice daily as needed.  5.  Sleeping difficulty: -Continue Ambien 10 mg daily as needed.  6.  Normocytic anemia: -Combination anemia from CKD, iron deficiency and myelosuppression from pomalidomide. -Last Feraheme on 12/09/2019 and 12/18/2019. -Ferritin is 694 with percent saturation of 34.  Hemoglobin is 9.3.  No active intervention at this time.   Orders placed this encounter:  No orders of the defined types were placed in this encounter.    [name], MD [facility] [phone]   I, Jacqualyn Posey, am acting as a scribe for Dr. Sanda Linger.  I, [name] MD, have reviewed the above documentation for accuracy and completeness, and I agree with the above.

## 2020-01-14 NOTE — Patient Instructions (Signed)
[place] at [facility] Discharge Instructions  You were seen today by Dr. Delton Coombes. He went over your recent results. Continue to drink plenty of fluids. He will see you back in 2 months for labs and follow up.   Thank you for choosing [place] at [facility] to provide your oncology and hematology care.  To afford each patient quality time with our provider, please arrive at least 15 minutes before your scheduled appointment time.   If you have a lab appointment with the [facility] please come in thru the  Main Entrance and check in at the main information desk  You need to re-schedule your appointment should you arrive 10 or more minutes late.  We strive to give you quality time with our providers, and arriving late affects you and other patients whose appointments are after yours.  Also, if you no show three or more times for appointments you may be dismissed from the clinic at the providers discretion.     Again, thank you for choosing [facility].  Our hope is that these requests will decrease the amount of time that you wait before being seen by our physicians.       _____________________________________________________________  Should you have questions after your visit to [facility], please contact our office at [phone] between the hours of 8:00 a.m. and 4:30 p.m.  Voicemails left after 4:00 p.m. will not be returned until the following business day.  For prescription refill requests, have your pharmacy contact our office and allow 72 hours.    Cancer Center Support Programs:   > Cancer Support Group  2nd Tuesday of the month 1pm-2pm, Journey Room

## 2020-01-21 ENCOUNTER — Telehealth (HOSPITAL_COMMUNITY): Payer: Self-pay | Admitting: *Deleted

## 2020-01-21 NOTE — Telephone Encounter (Signed)
I called patient to verify when she would be going to [facility] for her next immunizations. She reports that she is going in July and will get them at that time.

## 2020-01-26 ENCOUNTER — Other Ambulatory Visit (HOSPITAL_COMMUNITY): Payer: Self-pay | Admitting: Hematology

## 2020-01-26 DIAGNOSIS — C9000 Multiple myeloma not having achieved remission: Secondary | ICD-10-CM

## 2020-02-09 ENCOUNTER — Other Ambulatory Visit (HOSPITAL_COMMUNITY): Payer: Self-pay | Admitting: *Deleted

## 2020-02-09 DIAGNOSIS — R300 Dysuria: Secondary | ICD-10-CM

## 2020-02-09 NOTE — Progress Notes (Signed)
Patient called stating that she has had burning with urination for a few weeks.  She denies any fevers/chills/hematuria.  I have advised her to come up here for urine sample to check for UTI.  She was advised to take AZO over the counter or similar to help with the burning/pain with urination.  She verbalizes understanding.

## 2020-02-10 ENCOUNTER — Other Ambulatory Visit: Payer: Self-pay

## 2020-02-10 ENCOUNTER — Inpatient Hospital Stay (HOSPITAL_COMMUNITY): Payer: Medicare Other | Attending: Hematology

## 2020-02-10 ENCOUNTER — Encounter (HOSPITAL_COMMUNITY): Payer: Self-pay | Admitting: *Deleted

## 2020-02-10 DIAGNOSIS — R300 Dysuria: Secondary | ICD-10-CM

## 2020-02-10 LAB — URINALYSIS, ROUTINE W REFLEX MICROSCOPIC
Bilirubin Urine: NEGATIVE
Glucose, UA: NEGATIVE mg/dL
Hgb urine dipstick: NEGATIVE
Ketones, ur: NEGATIVE mg/dL
Leukocytes,Ua: NEGATIVE
Nitrite: NEGATIVE
Protein, ur: NEGATIVE mg/dL
Specific Gravity, Urine: 1.006 (ref 1.005–1.030)
pH: 7 (ref 5.0–8.0)

## 2020-02-10 NOTE — Progress Notes (Signed)
Patient came today for urinalysis.  Per Francene Finders, NP no signs of infection.  Patient is to continue with over the counter AZO or equivalent to help with the burning.  Patient has been notified and verbalizes understanding.

## 2020-02-12 ENCOUNTER — Other Ambulatory Visit (HOSPITAL_COMMUNITY): Payer: Self-pay | Admitting: Nurse Practitioner

## 2020-02-12 DIAGNOSIS — C9000 Multiple myeloma not having achieved remission: Secondary | ICD-10-CM

## 2020-02-12 MED ORDER — HYDROCODONE-ACETAMINOPHEN 7.5-325 MG PO TABS: 1.0000 | ORAL_TABLET | Freq: Four times a day (QID) | ORAL | 0 refills | Status: DC | PRN

## 2020-02-18 ENCOUNTER — Other Ambulatory Visit (HOSPITAL_COMMUNITY): Payer: Self-pay | Admitting: *Deleted

## 2020-02-18 DIAGNOSIS — C9000 Multiple myeloma not having achieved remission: Secondary | ICD-10-CM

## 2020-02-18 DIAGNOSIS — R42 Dizziness and giddiness: Secondary | ICD-10-CM

## 2020-02-18 MED ORDER — PANTOPRAZOLE SODIUM 40 MG PO TBEC: 40.0000 mg | DELAYED_RELEASE_TABLET | Freq: Every day | ORAL | 2 refills | Status: DC

## 2020-02-23 ENCOUNTER — Other Ambulatory Visit (HOSPITAL_COMMUNITY): Payer: Self-pay | Admitting: Hematology

## 2020-02-23 DIAGNOSIS — C9000 Multiple myeloma not having achieved remission: Secondary | ICD-10-CM

## 2020-03-03 ENCOUNTER — Other Ambulatory Visit (HOSPITAL_COMMUNITY): Payer: Self-pay | Admitting: *Deleted

## 2020-03-03 DIAGNOSIS — G479 Sleep disorder, unspecified: Secondary | ICD-10-CM

## 2020-03-03 MED ORDER — ZOLPIDEM TARTRATE 10 MG PO TABS: 10.0000 mg | ORAL_TABLET | Freq: Every evening | ORAL | 2 refills | Status: DC | PRN

## 2020-03-08 ENCOUNTER — Inpatient Hospital Stay (HOSPITAL_COMMUNITY): Payer: Medicare Other | Attending: Hematology

## 2020-03-08 ENCOUNTER — Other Ambulatory Visit: Payer: Self-pay

## 2020-03-08 DIAGNOSIS — Z23 Encounter for immunization: Secondary | ICD-10-CM | POA: Diagnosis not present

## 2020-03-08 DIAGNOSIS — N189 Chronic kidney disease, unspecified: Secondary | ICD-10-CM | POA: Diagnosis not present

## 2020-03-08 DIAGNOSIS — M549 Dorsalgia, unspecified: Secondary | ICD-10-CM | POA: Insufficient documentation

## 2020-03-08 DIAGNOSIS — F419 Anxiety disorder, unspecified: Secondary | ICD-10-CM | POA: Insufficient documentation

## 2020-03-08 DIAGNOSIS — F329 Major depressive disorder, single episode, unspecified: Secondary | ICD-10-CM | POA: Insufficient documentation

## 2020-03-08 DIAGNOSIS — D631 Anemia in chronic kidney disease: Secondary | ICD-10-CM | POA: Diagnosis not present

## 2020-03-08 DIAGNOSIS — C9000 Multiple myeloma not having achieved remission: Secondary | ICD-10-CM

## 2020-03-08 LAB — CBC WITH DIFFERENTIAL/PLATELET
Abs Immature Granulocytes: 0 10*3/uL (ref 0.00–0.07)
Basophils Absolute: 0.1 10*3/uL (ref 0.0–0.1)
Basophils Relative: 2 %
Eosinophils Absolute: 0.2 10*3/uL (ref 0.0–0.5)
Eosinophils Relative: 5 %
HCT: 31.1 % — ABNORMAL LOW (ref 36.0–46.0)
Hemoglobin: 10 g/dL — ABNORMAL LOW (ref 12.0–15.0)
Immature Granulocytes: 0 %
Lymphocytes Relative: 24 %
Lymphs Abs: 0.9 10*3/uL (ref 0.7–4.0)
MCH: 34.4 pg — ABNORMAL HIGH (ref 26.0–34.0)
MCHC: 32.2 g/dL (ref 30.0–36.0)
MCV: 106.9 fL — ABNORMAL HIGH (ref 80.0–100.0)
Monocytes Absolute: 0.7 10*3/uL (ref 0.1–1.0)
Monocytes Relative: 19 %
Neutro Abs: 1.9 10*3/uL (ref 1.7–7.7)
Neutrophils Relative %: 50 %
Platelets: 156 10*3/uL (ref 150–400)
RBC: 2.91 MIL/uL — ABNORMAL LOW (ref 3.87–5.11)
RDW: 12.8 % (ref 11.5–15.5)
WBC: 3.8 10*3/uL — ABNORMAL LOW (ref 4.0–10.5)
nRBC: 0 % (ref 0.0–0.2)

## 2020-03-08 LAB — COMPREHENSIVE METABOLIC PANEL
ALT: 15 U/L (ref 0–44)
AST: 18 U/L (ref 15–41)
Albumin: 4.1 g/dL (ref 3.5–5.0)
Alkaline Phosphatase: 52 U/L (ref 38–126)
Anion gap: 11 (ref 5–15)
BUN: 29 mg/dL — ABNORMAL HIGH (ref 8–23)
CO2: 26 mmol/L (ref 22–32)
Calcium: 9.3 mg/dL (ref 8.9–10.3)
Chloride: 102 mmol/L (ref 98–111)
Creatinine, Ser: 1.87 mg/dL — ABNORMAL HIGH (ref 0.44–1.00)
GFR calc Af Amer: 31 mL/min — ABNORMAL LOW (ref >60)
GFR calc non Af Amer: 27 mL/min — ABNORMAL LOW (ref >60)
Glucose, Bld: 118 mg/dL — ABNORMAL HIGH (ref 70–99)
Potassium: 4.2 mmol/L (ref 3.5–5.1)
Sodium: 139 mmol/L (ref 135–145)
Total Bilirubin: 0.6 mg/dL (ref 0.3–1.2)
Total Protein: 7 g/dL (ref 6.5–8.1)

## 2020-03-08 LAB — FOLATE: Folate: 55 ng/mL (ref >5.9)

## 2020-03-08 LAB — IRON AND TIBC
Iron: 40 ug/dL (ref 28–170)
Saturation Ratios: 16 % (ref 10.4–31.8)
TIBC: 256 ug/dL (ref 250–450)
UIBC: 216 ug/dL

## 2020-03-08 LAB — FERRITIN: Ferritin: 526 ng/mL — ABNORMAL HIGH (ref 11–307)

## 2020-03-08 LAB — VITAMIN B12: Vitamin B-12: 365 pg/mL (ref 180–914)

## 2020-03-08 LAB — LACTATE DEHYDROGENASE: LDH: 122 U/L (ref 98–192)

## 2020-03-09 LAB — PROTEIN ELECTROPHORESIS, SERUM
A/G Ratio: 1.4 (ref 0.7–1.7)
Albumin ELP: 3.8 g/dL (ref 2.9–4.4)
Alpha-1-Globulin: 0.2 g/dL (ref 0.0–0.4)
Alpha-2-Globulin: 0.7 g/dL (ref 0.4–1.0)
Beta Globulin: 0.9 g/dL (ref 0.7–1.3)
Gamma Globulin: 0.8 g/dL (ref 0.4–1.8)
Globulin, Total: 2.7 g/dL (ref 2.2–3.9)
Total Protein ELP: 6.5 g/dL (ref 6.0–8.5)

## 2020-03-09 LAB — KAPPA/LAMBDA LIGHT CHAINS
Kappa free light chain: 32.2 mg/L — ABNORMAL HIGH (ref 3.3–19.4)
Kappa, lambda light chain ratio: 1.17 (ref 0.26–1.65)
Lambda free light chains: 27.5 mg/L — ABNORMAL HIGH (ref 5.7–26.3)

## 2020-03-10 ENCOUNTER — Other Ambulatory Visit (HOSPITAL_COMMUNITY): Payer: Self-pay

## 2020-03-10 DIAGNOSIS — C9000 Multiple myeloma not having achieved remission: Secondary | ICD-10-CM

## 2020-03-10 MED ORDER — HYDROCODONE-ACETAMINOPHEN 7.5-325 MG PO TABS: 1.0000 | ORAL_TABLET | Freq: Four times a day (QID) | ORAL | 0 refills | Status: DC | PRN

## 2020-03-11 LAB — IMMUNOFIXATION ELECTROPHORESIS
IgA: 80 mg/dL (ref 64–422)
IgG (Immunoglobin G), Serum: 907 mg/dL (ref 586–1602)
IgM (Immunoglobulin M), Srm: 18 mg/dL — ABNORMAL LOW (ref 26–217)
Total Protein ELP: 6.3 g/dL (ref 6.0–8.5)

## 2020-03-15 ENCOUNTER — Other Ambulatory Visit: Payer: Self-pay

## 2020-03-15 ENCOUNTER — Inpatient Hospital Stay (HOSPITAL_BASED_OUTPATIENT_CLINIC_OR_DEPARTMENT_OTHER): Payer: Medicare Other | Admitting: Hematology

## 2020-03-15 ENCOUNTER — Ambulatory Visit (HOSPITAL_COMMUNITY)
Admission: RE | Admit: 2020-03-15 | Discharge: 2020-03-15 | Disposition: A | Discharge status: Home / Self Care | Payer: Medicare Other | Source: Ambulatory Visit | Attending: Hematology | Admitting: Hematology

## 2020-03-15 VITALS — BP 154/53 | HR 63 | Temp 97.8°F | Resp 18 | Wt 169.8 lb

## 2020-03-15 DIAGNOSIS — C9000 Multiple myeloma not having achieved remission: Secondary | ICD-10-CM | POA: Diagnosis present

## 2020-03-15 MED ORDER — HEPATITIS B VAC RECOMBINANT 20 MCG/ML IJ SUSP
2.0000 mL | Freq: Once | INTRAMUSCULAR | Status: AC
  Administered 2020-03-15: 40 ug via INTRAMUSCULAR
  Filled 2020-03-15: qty 2

## 2020-03-15 MED ORDER — PNEUMOCOCCAL VAC POLYVALENT 25 MCG/0.5ML IJ INJ
0.5000 mL | INJECTION | Freq: Once | INTRAMUSCULAR | Status: AC
  Administered 2020-03-15: 0.5 mL via INTRAMUSCULAR
  Filled 2020-03-15: qty 0.5

## 2020-03-15 NOTE — Patient Instructions (Signed)
[facility] at [facility] Discharge Instructions  You were seen today by Dr. Katragadda. He went over your recent results. Dr. Katragadda will see you back in for labs and follow up.   Thank you for choosing [facility] at [facility] to provide your oncology and hematology care.  To afford each patient quality time with our provider, please arrive at least 15 minutes before your scheduled appointment time.   If you have a lab appointment with the Cancer Center please come in thru the Main Entrance and check in at the main information desk  You need to re-schedule your appointment should you arrive 10 or more minutes late.  We strive to give you quality time with our providers, and arriving late affects you and other patients whose appointments are after yours.  Also, if you no show three or more times for appointments you may be dismissed from the clinic at the providers discretion.     Again, thank you for choosing [facility].  Our hope is that these requests will decrease the amount of time that you wait before being seen by our physicians.       _____________________________________________________________  Should you have questions after your visit to [facility], please contact our office at [phone] between the hours of 8:00 a.m. and 4:30 p.m.  Voicemails left after 4:00 p.m. will not be returned until the following business day.  For prescription refill requests, have your pharmacy contact our office and allow 72 hours.    Cancer Center Support Programs:   > Cancer Support Group  2nd Tuesday of the month 1pm-2pm, Journey Room    

## 2020-03-15 NOTE — Progress Notes (Signed)
[address] [place], [place] [address]   CLINIC:  Medical Oncology/Hematology  PCP:  [facility] [place] / [place] [address]  [phone]  REASON FOR VISIT:  Follow-up for multiple myeloma  PRIOR THERAPY:  4 cycles of KPD from 04/17/2018 through 08/21/2018. -Stem cell transplant on 10/23/2018. -Maintenance pomalidomide started on 03/10/2019.  CURRENT THERAPY: Observation  INTERVAL HISTORY:  [name], a 74 y.o. female, returns for routine follow-up for her multiple myeloma. Shayonna was last seen on 01/14/2020.  She notes that her tailbone has been hurting her a lot and that sitting is somewhat painful for her; she notes that she cracked it around 35 years ago. She also complains that her right leg feels tight, like it is swollen even though it is not.   REVIEW OF SYSTEMS:  Review of Systems  Constitutional: Positive for appetite change (mildly decreased) and fatigue (mild).  HENT:   Positive for trouble swallowing.   Eyes: Negative.   Respiratory: Negative.   Cardiovascular: Positive for leg swelling.  Gastrointestinal: Negative.   Endocrine: Negative.   Genitourinary: Positive for difficulty urinating.   Musculoskeletal: Positive for back pain and neck pain.  Skin: Negative.   Neurological: Positive for headaches (L side).  Hematological: Negative.   Psychiatric/Behavioral: Negative.   All other systems reviewed and are negative.   PAST MEDICAL/SURGICAL HISTORY:  Past Medical History:  Diagnosis Date  . Anxiety   . Depression   . Hypertension   . Multiple myeloma ([place])    multiple myeloma   Past Surgical History:  Procedure Laterality Date  . ABDOMINAL HYSTERECTOMY     total  . APPENDECTOMY    . LAPAROSCOPIC APPENDECTOMY N/A 05/20/2018   Procedure: APPENDECTOMY LAPAROSCOPIC;  Surgeon: [name], MD;  Location: [facility];  Service: General;  Laterality: N/A;  . PORTACATH PLACEMENT Right  04/14/2018   Procedure: INSERTION PORT-A-CATH;  Surgeon: [name], MD;  Location: [facility];  Service: General;  Laterality: Right;    SOCIAL HISTORY:  Social History   Socioeconomic History  . Marital status: Legally Separated    Spouse name: Not on file  . Number of children: 6  . Years of education: Not on file  . Highest education level: Not on file  Occupational History    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  Tobacco Use  . Smoking status: Never Smoker  . Smokeless tobacco: Never Used  Vaping Use  . Vaping Use: Never used  Substance and Sexual Activity  . Alcohol use: Never  . Drug use: Not Currently  . Sexual activity: Not Currently  Other Topics Concern  . Not on file  Social History Narrative  . Not on file   Social Determinants of Health   Financial Resource Strain:   . Difficulty of Paying Living Expenses:   Food Insecurity:   . Worried About Charity fundraiser in the Last Year:   . Arboriculturist in the Last Year:   Transportation Needs:   . Film/video editor (Medical):   Marland Kitchen Lack of Transportation (Non-Medical):   Physical Activity:   . Days of Exercise per Week:   . Minutes of Exercise per Session:   Stress:   . Feeling of Stress :   Social Connections:   . Frequency of Communication with Friends and Family:   . Frequency of Social Gatherings with Friends and Family:   . Attends Religious Services:   . Active  Member of Clubs or Organizations:   . Attends Archivist Meetings:   Marland Kitchen Marital Status:   Intimate Partner Violence:   . Fear of Current or Ex-Partner:   . Emotionally Abused:   Marland Kitchen Physically Abused:   . Sexually Abused:     FAMILY HISTORY:  Family History  Problem Relation Age of Onset  . Heart disease Mother   . Emphysema Father   . Diabetes Sister   . Depression Sister   . Cancer Brother        liver, lung, and colon  . Diabetes Brother     CURRENT MEDICATIONS:  Current Outpatient Medications  Medication Sig  Dispense Refill  . acyclovir (ZOVIRAX) 400 MG tablet Take 1 tablet (400 mg total) by mouth 2 (two) times daily. 60 tablet 4  . ALPRAZolam (XANAX) 0.5 MG tablet Take one tablet during the day as needed for anxiety and two tablets at bedtime as needed for anxiety/insomnia 90 tablet 3  . aspirin EC 81 MG tablet Take 81 mg by mouth daily.    . Calcium Carb-Cholecalciferol (CALCIUM 1000 + D PO) Take 1,000 mg by mouth daily. Take one tablet daily until next visit with oncologist.    . citalopram (CELEXA) 40 MG tablet Take 1 tablet (40 mg total) by mouth daily. 90 tablet 1  . glipiZIDE (GLUCOTROL XL) 5 MG 24 hr tablet Take 1 tablet by mouth once daily with breakfast 90 tablet 2  . HYDROcodone-acetaminophen (NORCO) 7.5-325 MG tablet Take 1 tablet by mouth every 6 (six) hours as needed for moderate pain. 120 tablet 0  . Multiple Vitamin (THERA) TABS Take 1 tablet by mouth daily. 30 tablet 5  . pantoprazole (PROTONIX) 40 MG tablet Take 1 tablet (40 mg total) by mouth daily. 30 tablet 2  . POMALYST 2 MG capsule TAKE 1 CAPSULE BY MOUTH ONCE DAILY FOR 21 DAYS ON AND 7 DAYS OFF 21 capsule 1  . POMALYST 2 MG capsule TAKE 1 CAPSULE BY MOUTH ONCE DAILY FOR 21 DAYS ON AND 7 DAYS OFF 21 capsule 1  . terbinafine (LAMISIL) 1 % cream Apply 1 application topically 2 (two) times daily. 30 g 0  . zolpidem (AMBIEN) 10 MG tablet Take 1 tablet (10 mg total) by mouth at bedtime as needed for sleep. 30 tablet 2  . ondansetron (ZOFRAN) 4 MG tablet Take 1 tablet (4 mg total) by mouth every 8 (eight) hours as needed for nausea or vomiting. (Patient not taking: Reported on 03/15/2020) 20 tablet 1   No current facility-administered medications for this visit.    ALLERGIES:  Allergies  Allergen Reactions  . Ciprofloxacin Anaphylaxis  . Amoxicillin   . Morphine And Related Nausea And Vomiting  . Augmentin [Amoxicillin-Pot Clavulanate] Other (See Comments)    Headache, insomnia    PHYSICAL EXAM:  Performance status (ECOG): 1  - Symptomatic but completely ambulatory  Vitals:   03/15/20 1510  BP: (!) 154/53  Pulse: 63  Resp: 18  Temp: 97.8 F (36.6 C)  SpO2: 97%   Wt Readings from Last 3 Encounters:  03/15/20 169 lb 12.8 oz (77 kg)  01/14/20 171 lb 1.6 oz (77.6 kg)  12/03/19 170 lb 8 oz (77.3 kg)   Physical Exam Vitals and nursing note reviewed.  Constitutional:      Appearance: Normal appearance.  HENT:     Mouth/Throat:     Mouth: Mucous membranes are moist.  Eyes:     Pupils: Pupils are equal, round, and reactive  to light.  Cardiovascular:     Rate and Rhythm: Normal rate and regular rhythm.     Pulses: Normal pulses.     Heart sounds: Normal heart sounds.  Pulmonary:     Effort: Pulmonary effort is normal.     Breath sounds: Normal breath sounds.  Abdominal:     Palpations: Abdomen is soft. There is no mass.     Tenderness: There is no abdominal tenderness.  Musculoskeletal:        General: No swelling.     Cervical back: Neck supple.     Right lower leg: No swelling. No edema.     Left lower leg: No swelling. No edema.  Neurological:     Mental Status: She is alert and oriented to person, place, and time.  Psychiatric:        Mood and Affect: Mood normal.        Behavior: Behavior normal.     LABORATORY DATA:  I have reviewed the labs as listed.  CBC Latest Ref Rng & Units 03/08/2020 01/07/2020 11/26/2019  WBC 4.0 - 10.5 K/uL 3.8(L) 3.3(L) 3.8(L)  Hemoglobin 12.0 - 15.0 g/dL 10.0(L) 9.3(L) 9.1(L)  Hematocrit 36 - 46 % 31.1(L) 28.9(L) 28.7(L)  Platelets 150 - 400 K/uL 156 138(L) 156   CMP Latest Ref Rng & Units 03/08/2020 01/07/2020 11/26/2019  Glucose 70 - 99 mg/dL 118(H) 123(H) 106(H)  BUN 8 - 23 mg/dL 29(H) 32(H) 24(H)  Creatinine 0.44 - 1.00 mg/dL 1.87(H) 2.21(H) 1.93(H)  Sodium 135 - 145 mmol/L 139 139 139  Potassium 3.5 - 5.1 mmol/L 4.2 5.0 4.4  Chloride 98 - 111 mmol/L 102 103 102  CO2 22 - 32 mmol/L _0 Calcium 8.9 - 10.3 mg/dL 9.3 9.3 9.0  Total Protein 6.5 - 8.1  g/dL 7.0 6.7 6.8  Total Bilirubin 0.3 - 1.2 mg/dL 0.6 0.5 0.5  Alkaline Phos 38 - 126 U/L 52 56 57  AST 15 - 41 U/L _1 ALT 0 - 44 U/L _2 Component Value Date/Time   RBC 2.91 (L) 03/08/2020 1318   MCV 106.9 (H) 03/08/2020 1318   MCH 34.4 (H) 03/08/2020 1318   MCHC 32.2 03/08/2020 1318   RDW 12.8 03/08/2020 1318   LYMPHSABS 0.9 03/08/2020 1318   MONOABS 0.7 03/08/2020 1318   EOSABS 0.2 03/08/2020 1318   BASOSABS 0.1 03/08/2020 1318    DIAGNOSTIC IMAGING:  I have independently reviewed the scans and discussed with the patient. No results found.   ASSESSMENT:  1.  IgG lambda plasma cell myeloma, stage II, standard risk: -4 cycles of KPD from 04/17/2018 through 08/21/2018, stem cell transplant on 10/23/2018. -PET scan on 01/21/2019 showed multiple bone lesions but without any hypermetabolic activity. -BMBX on 01/21/2019 with normocellular marrow with no increase in plasma cells.  Normal FISH.  MRD results negative. -Maintenance pomalidomide 2 mg 3 weeks on/1 week off started on 03/10/2019. -Bone marrow biopsy on 10/29/2019 shows trilineage hematopoiesis with no evidence of plasma cells.  Chromosome analysis and FISH are normal.   PLAN:  1.  IgG lambda plasma cell myeloma: -I reviewed myeloma labs from 03/08/2020.  SPEP is negative.  Immunofixation was normal.  Kappa light chains are 32.2 and lambda light chains of 27.5 with ratio of 1.17. -Hemoglobin is 10 with white count of 3.8 and normal neutrophil count. -We will continue pomalidomide maintenance which she is tolerating well.  I plan to see her back  in 2 months for follow-up.  2.  Back pain: -Continue hydrocodone 7.5 mg every 6 hours as needed.  3.  Myeloma bone disease: -We are holding denosumab due to exposure of bone in the right lower jaw molar area. -Continue calcium and vitamin D.  4.  Anxiety: -Continue Xanax 0.5 mg twice daily.  5.  Sleeping difficulty: -Continue Ambien 10 mg daily as needed..  6.   Normocytic anemia: -Combination anemia from CKD, iron deficiency and myelosuppression from pomalidomide. -She received Feraheme infusions. -Labs on 03/08/2020 shows ferritin 526, percent saturation of 16.  Folic acid and T92 was normal.  Creatinine is stable at 1.87.  If there is any worsening, will consider parenteral iron therapy.  7.  Coccygeal pain: -This is new in the last 2 weeks.  Does not report any trauma to the area. -She reports pain when she sits down.  I will obtain x-rays of her sacrum and coccyx.   Orders placed this encounter:  No orders of the defined types were placed in this encounter.    [name], MD [facility] [phone]   I, Jacqualyn Posey, am acting as a scribe for Dr. Sanda Linger.  I, [name] MD, have reviewed the above documentation for accuracy and completeness, and I agree with the above.

## 2020-03-21 ENCOUNTER — Encounter (HOSPITAL_COMMUNITY): Payer: Self-pay

## 2020-03-21 ENCOUNTER — Other Ambulatory Visit (HOSPITAL_COMMUNITY): Payer: Self-pay | Admitting: Hematology

## 2020-03-21 ENCOUNTER — Other Ambulatory Visit (HOSPITAL_COMMUNITY): Payer: Self-pay

## 2020-03-21 DIAGNOSIS — C9000 Multiple myeloma not having achieved remission: Secondary | ICD-10-CM

## 2020-03-21 MED ORDER — METHYLPREDNISOLONE 4 MG PO TBPK
ORAL_TABLET | ORAL | 0 refills | Status: DC
Start: 2020-03-21 — End: 2020-08-02

## 2020-03-21 NOTE — Telephone Encounter (Signed)
Okay per Dr. Delton Coombes to refill pomalyst.

## 2020-03-21 NOTE — Progress Notes (Signed)
Patient called the clinic reporting itching related to poison sumac exposure. Patient reports no relief from OTC hydrocortisone cream. Per Dr. Delton Coombes, prescription send for Medrol DosePak.

## 2020-03-29 ENCOUNTER — Other Ambulatory Visit (HOSPITAL_COMMUNITY): Payer: Self-pay

## 2020-03-29 DIAGNOSIS — F32A Depression, unspecified: Secondary | ICD-10-CM

## 2020-03-29 MED ORDER — CITALOPRAM HYDROBROMIDE 40 MG PO TABS: 40.0000 mg | ORAL_TABLET | Freq: Every day | ORAL | 1 refills | Status: DC

## 2020-03-31 ENCOUNTER — Other Ambulatory Visit (HOSPITAL_COMMUNITY): Payer: Self-pay

## 2020-03-31 DIAGNOSIS — C9000 Multiple myeloma not having achieved remission: Secondary | ICD-10-CM

## 2020-03-31 DIAGNOSIS — F419 Anxiety disorder, unspecified: Secondary | ICD-10-CM

## 2020-03-31 MED ORDER — ALPRAZOLAM 0.5 MG PO TABS: ORAL_TABLET | ORAL | 3 refills | Status: DC

## 2020-04-04 IMAGING — CT CT ABD-PELV W/O CM
2 of 4 series · 16 of 46 positions shown, 18 images · non-contrast
Comparison: Skeletal survey radiograph dated 04/07/2018

CLINICAL DATA: 71-year-old female with abdominal pain. History of
multiple myeloma.

EXAM:
CT ABDOMEN AND PELVIS WITHOUT CONTRAST
TECHNIQUE: Multidetector CT imaging of the abdomen and pelvis was performed
following the standard protocol without IV contrast.

[Series 2: axial st · axial · 0.79mm/px · z∈[-758,-338]mm · 13 of 94 slices shown, 15 images]
[im 5/94  soft-tissue]
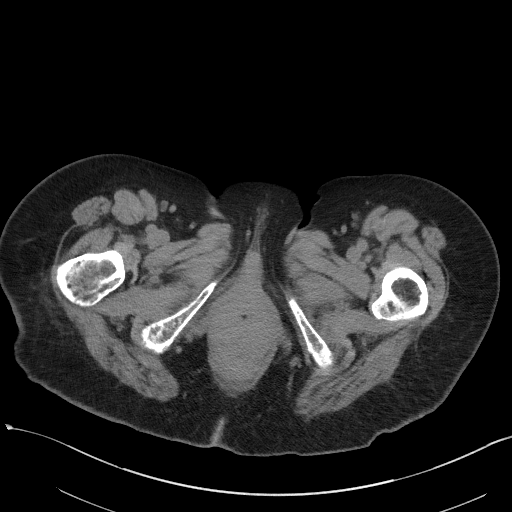
[im 5/94  bone]
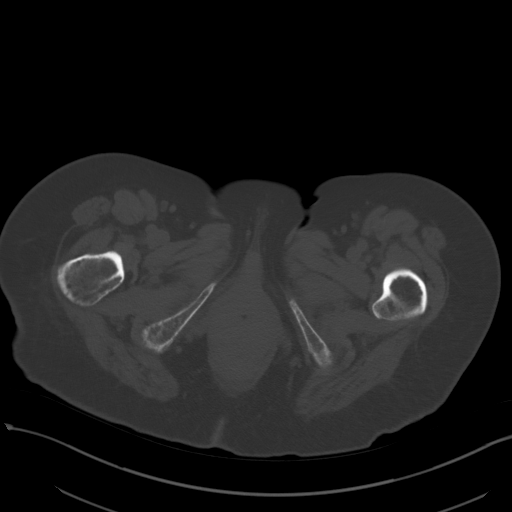
[im 13/94  soft-tissue]
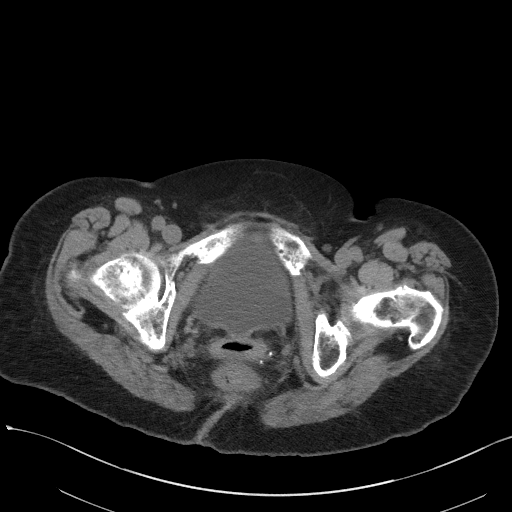
[im 22/94  soft-tissue]
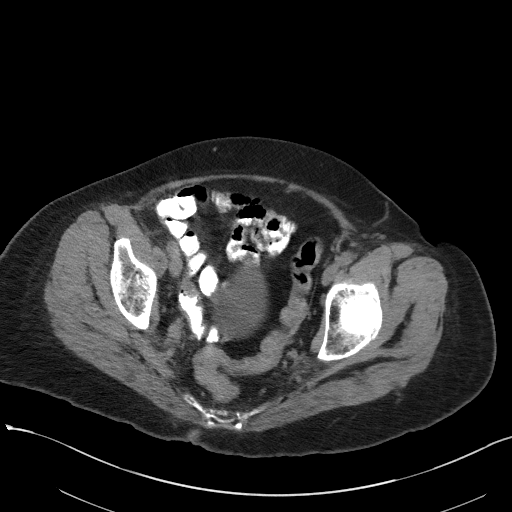
[im 26/94  soft-tissue]
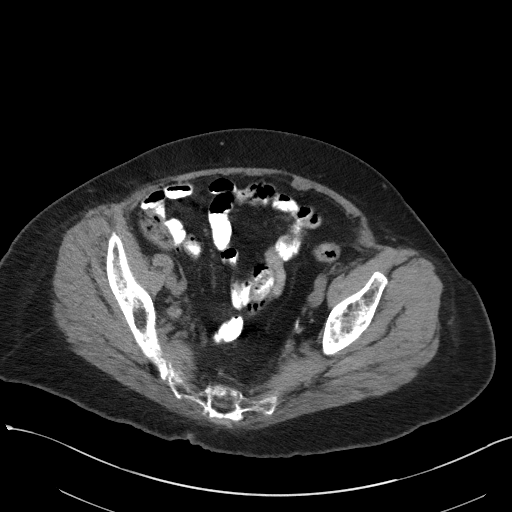
[im 34/94  soft-tissue]
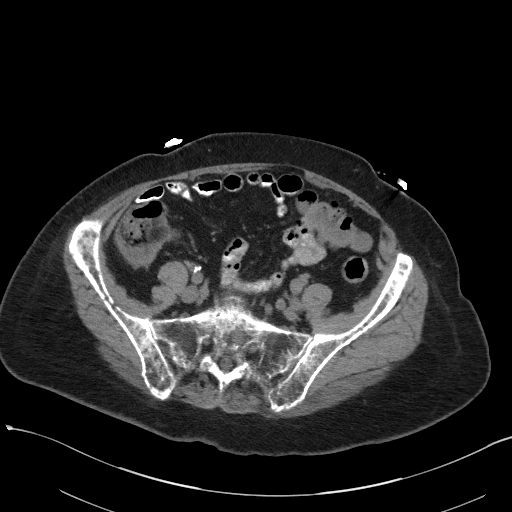
[im 39/94  soft-tissue]
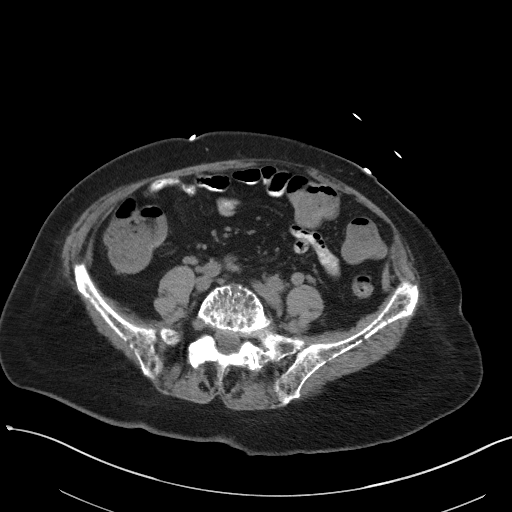
[im 47/94  soft-tissue]
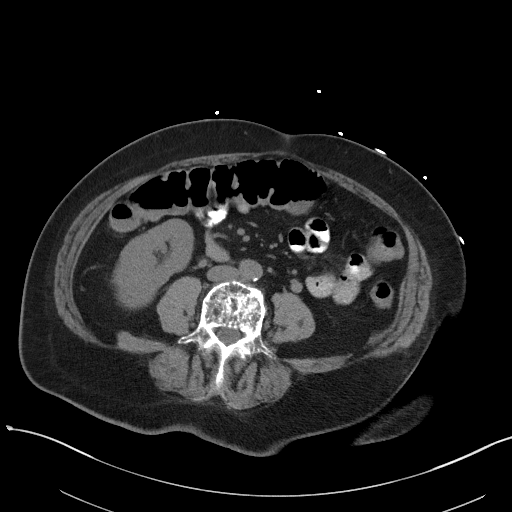
[im 55/94  soft-tissue]
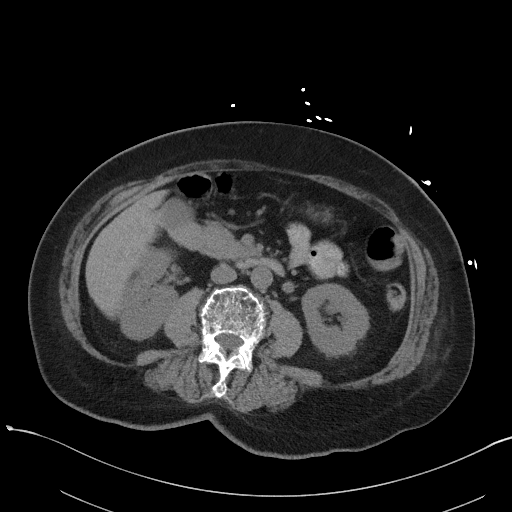
[im 60/94  soft-tissue]
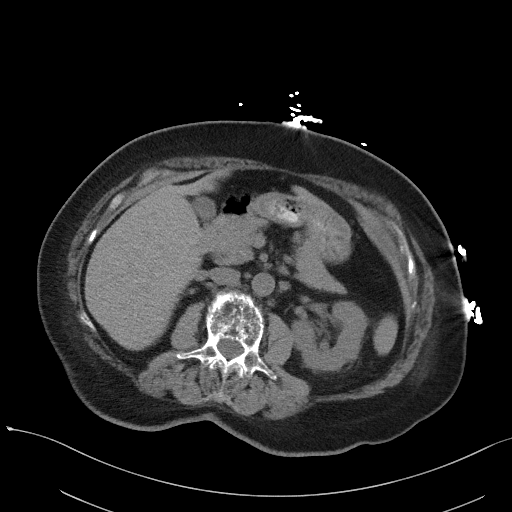
[im 60/94  bone]
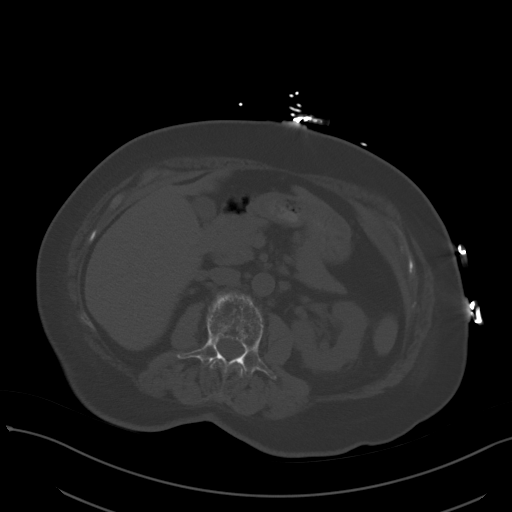
[im 68/94  soft-tissue]
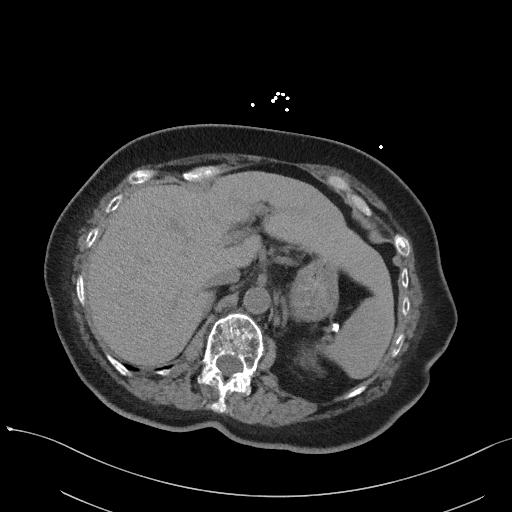
[im 72/94  soft-tissue]
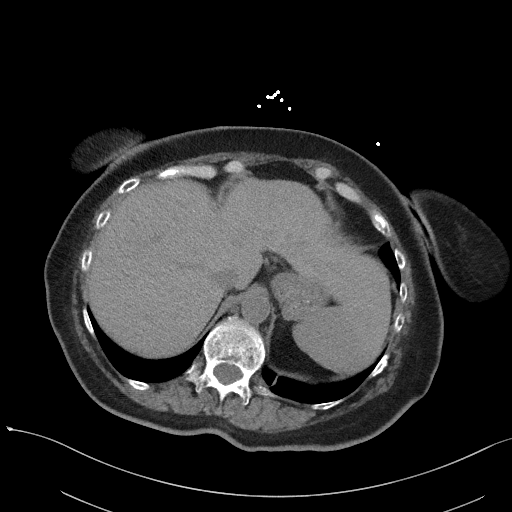
[im 81/94  soft-tissue]
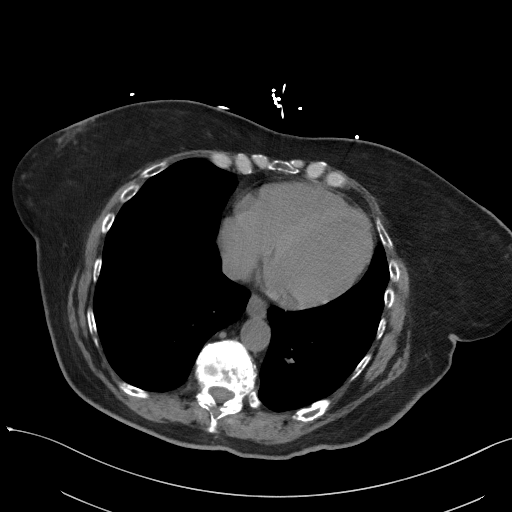
[im 89/94  soft-tissue]
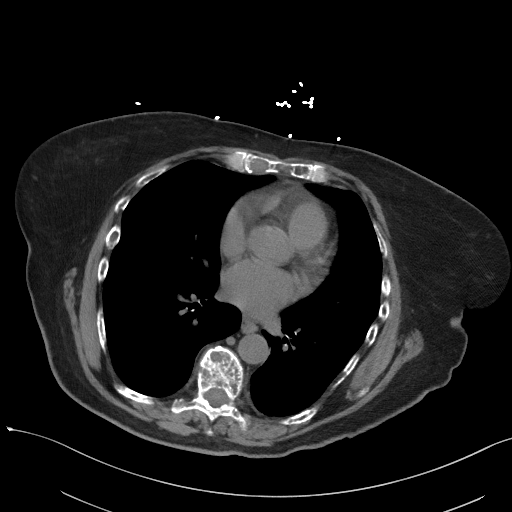

[Series 5: coronal st · coronal · 0.85mm/px · 3 of 111 slices shown]
[im 37/111  soft-tissue]
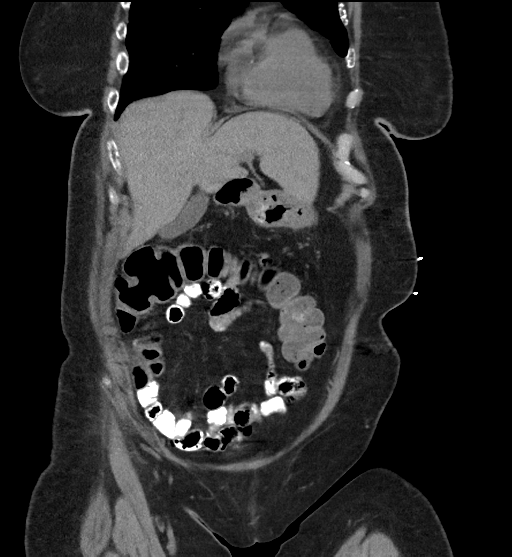
[im 49/111  soft-tissue]
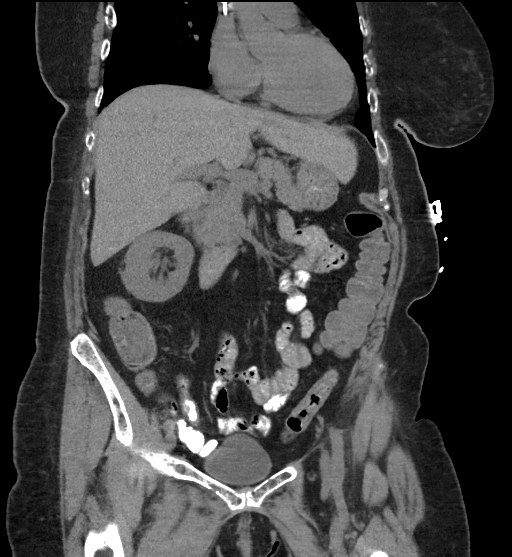
[im 62/111  soft-tissue]
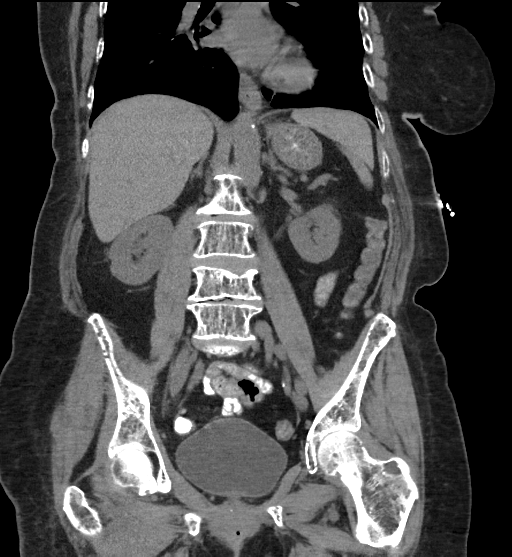

[16 of 46 positions shown; findings below may reference images not displayed]

FINDINGS: Evaluation of this exam is limited in the absence of intravenous
contrast.

Lower chest: Left lung base linear atelectasis/scarring. The
visualized lung bases are otherwise clear. There is hypoattenuation
of the cardiac blood pool suggestive of a degree of anemia. Clinical
correlation is recommended. The tip of Port-A-Cath is partially
visualized in the region of the cavoatrial junction.

No intra-abdominal free air or free fluid.

Hepatobiliary: No focal liver abnormality is seen. No gallstones,
gallbladder wall thickening, or biliary dilatation.

Pancreas: Punctate focus of calcification in the body of the
pancreas may be related to vascular calcification or sequela of
prior inflammation. The pancreas is otherwise unremarkable.

Spleen: Normal in size without focal abnormality.

Adrenals/Urinary Tract: Adrenal glands are unremarkable. Kidneys are
normal, without renal calculi, focal lesion, or hydronephrosis.
Bladder is unremarkable.

Stomach/Bowel: There is loose stool within the colon compatible with
diarrheal state. There is no bowel obstruction or active
inflammation. Normal appendix.

Vascular/Lymphatic: Mild aortoiliac atherosclerotic disease. The
abdominal aorta and IVC are otherwise grossly unremarkable on this
noncontrast CT. No portal venous gas. There is no adenopathy.

Reproductive: Hysterectomy.  No pelvic mass.

Other: None

Musculoskeletal: Osteopenia. Innumerable lytic osseous lesions
consistent with known multiple myeloma. There is compression
fracture of the superior endplate of L4 with approximately 40% loss
of vertebral body height centrally similar or progressed since the
prior radiograph. There is compression fracture of the T11 with
approximately 50% loss of vertebral body height and mild anterior
wedging similar to prior radiograph. There are multiple old
appearing left posterior rib fractures. No definite acute fracture.
Evaluation for acute fractures however limited due to advanced
osteopenia and extensive lytic lucencies.
IMPRESSION: 1. Diarrheal state. Correlation with clinical exam and stool
cultures recommended. No bowel obstruction. Normal appendix.
2. Innumerable osseous lytic lesions in keeping with known multiple
myeloma. Known T11 and L4 compression fractures as well as multiple
old-appearing left posterior rib fractures. No definite acute
fracture.

## 2020-04-08 ENCOUNTER — Other Ambulatory Visit (HOSPITAL_COMMUNITY): Payer: Self-pay

## 2020-04-08 DIAGNOSIS — C9000 Multiple myeloma not having achieved remission: Secondary | ICD-10-CM

## 2020-04-08 MED ORDER — HYDROCODONE-ACETAMINOPHEN 7.5-325 MG PO TABS: 1.0000 | ORAL_TABLET | Freq: Four times a day (QID) | ORAL | 0 refills | Status: DC | PRN

## 2020-04-14 ENCOUNTER — Other Ambulatory Visit (HOSPITAL_COMMUNITY): Payer: Self-pay

## 2020-04-14 DIAGNOSIS — C9000 Multiple myeloma not having achieved remission: Secondary | ICD-10-CM

## 2020-04-14 MED ORDER — GLIPIZIDE ER 5 MG PO TB24: ORAL_TABLET | ORAL | 2 refills | Status: DC

## 2020-04-14 NOTE — Telephone Encounter (Signed)
Glipizide refilled per Dr. Delton Coombes.

## 2020-04-19 ENCOUNTER — Other Ambulatory Visit (HOSPITAL_COMMUNITY): Payer: Self-pay | Admitting: Hematology

## 2020-04-19 DIAGNOSIS — C9000 Multiple myeloma not having achieved remission: Secondary | ICD-10-CM

## 2020-04-20 ENCOUNTER — Other Ambulatory Visit (HOSPITAL_COMMUNITY): Payer: Self-pay | Admitting: *Deleted

## 2020-04-20 DIAGNOSIS — C9000 Multiple myeloma not having achieved remission: Secondary | ICD-10-CM

## 2020-04-20 MED ORDER — POMALIDOMIDE 2 MG PO CAPS: ORAL_CAPSULE | ORAL | 1 refills | Status: DC

## 2020-04-20 NOTE — Telephone Encounter (Signed)
Chart reviewed. Per Dr. Tomie China last office note, it is okay to refill Pomalyst.

## 2020-05-02 ENCOUNTER — Other Ambulatory Visit: Payer: Self-pay

## 2020-05-02 ENCOUNTER — Inpatient Hospital Stay (HOSPITAL_COMMUNITY): Payer: Medicare Other | Attending: Hematology

## 2020-05-02 DIAGNOSIS — Z833 Family history of diabetes mellitus: Secondary | ICD-10-CM | POA: Insufficient documentation

## 2020-05-02 DIAGNOSIS — M7989 Other specified soft tissue disorders: Secondary | ICD-10-CM | POA: Diagnosis not present

## 2020-05-02 DIAGNOSIS — Z885 Allergy status to narcotic agent status: Secondary | ICD-10-CM | POA: Diagnosis not present

## 2020-05-02 DIAGNOSIS — R5383 Other fatigue: Secondary | ICD-10-CM | POA: Insufficient documentation

## 2020-05-02 DIAGNOSIS — Z880 Allergy status to penicillin: Secondary | ICD-10-CM | POA: Diagnosis not present

## 2020-05-02 DIAGNOSIS — M545 Low back pain: Secondary | ICD-10-CM | POA: Insufficient documentation

## 2020-05-02 DIAGNOSIS — R0602 Shortness of breath: Secondary | ICD-10-CM | POA: Diagnosis not present

## 2020-05-02 DIAGNOSIS — G479 Sleep disorder, unspecified: Secondary | ICD-10-CM | POA: Diagnosis not present

## 2020-05-02 DIAGNOSIS — M549 Dorsalgia, unspecified: Secondary | ICD-10-CM | POA: Diagnosis not present

## 2020-05-02 DIAGNOSIS — C9000 Multiple myeloma not having achieved remission: Secondary | ICD-10-CM | POA: Diagnosis not present

## 2020-05-02 DIAGNOSIS — Z8249 Family history of ischemic heart disease and other diseases of the circulatory system: Secondary | ICD-10-CM | POA: Diagnosis not present

## 2020-05-02 DIAGNOSIS — Z881 Allergy status to other antibiotic agents status: Secondary | ICD-10-CM | POA: Insufficient documentation

## 2020-05-02 DIAGNOSIS — R519 Headache, unspecified: Secondary | ICD-10-CM | POA: Diagnosis not present

## 2020-05-02 DIAGNOSIS — F419 Anxiety disorder, unspecified: Secondary | ICD-10-CM | POA: Diagnosis not present

## 2020-05-02 DIAGNOSIS — F329 Major depressive disorder, single episode, unspecified: Secondary | ICD-10-CM | POA: Diagnosis not present

## 2020-05-02 DIAGNOSIS — Z7901 Long term (current) use of anticoagulants: Secondary | ICD-10-CM | POA: Insufficient documentation

## 2020-05-02 DIAGNOSIS — I2699 Other pulmonary embolism without acute cor pulmonale: Secondary | ICD-10-CM | POA: Diagnosis not present

## 2020-05-02 DIAGNOSIS — Z79899 Other long term (current) drug therapy: Secondary | ICD-10-CM | POA: Insufficient documentation

## 2020-05-02 DIAGNOSIS — D631 Anemia in chronic kidney disease: Secondary | ICD-10-CM | POA: Diagnosis not present

## 2020-05-02 DIAGNOSIS — N184 Chronic kidney disease, stage 4 (severe): Secondary | ICD-10-CM | POA: Insufficient documentation

## 2020-05-02 DIAGNOSIS — R197 Diarrhea, unspecified: Secondary | ICD-10-CM | POA: Insufficient documentation

## 2020-05-02 DIAGNOSIS — Z9049 Acquired absence of other specified parts of digestive tract: Secondary | ICD-10-CM | POA: Diagnosis not present

## 2020-05-02 DIAGNOSIS — R102 Pelvic and perineal pain: Secondary | ICD-10-CM | POA: Diagnosis not present

## 2020-05-02 DIAGNOSIS — Z801 Family history of malignant neoplasm of trachea, bronchus and lung: Secondary | ICD-10-CM | POA: Insufficient documentation

## 2020-05-02 DIAGNOSIS — Z836 Family history of other diseases of the respiratory system: Secondary | ICD-10-CM | POA: Insufficient documentation

## 2020-05-02 LAB — CBC WITH DIFFERENTIAL/PLATELET
Abs Immature Granulocytes: 0 10*3/uL (ref 0.00–0.07)
Basophils Absolute: 0.1 10*3/uL (ref 0.0–0.1)
Basophils Relative: 2 %
Eosinophils Absolute: 0.2 10*3/uL (ref 0.0–0.5)
Eosinophils Relative: 6 %
HCT: 31.4 % — ABNORMAL LOW (ref 36.0–46.0)
Hemoglobin: 10 g/dL — ABNORMAL LOW (ref 12.0–15.0)
Immature Granulocytes: 0 %
Lymphocytes Relative: 30 %
Lymphs Abs: 0.9 10*3/uL (ref 0.7–4.0)
MCH: 34.8 pg — ABNORMAL HIGH (ref 26.0–34.0)
MCHC: 31.8 g/dL (ref 30.0–36.0)
MCV: 109.4 fL — ABNORMAL HIGH (ref 80.0–100.0)
Monocytes Absolute: 0.4 10*3/uL (ref 0.1–1.0)
Monocytes Relative: 14 %
Neutro Abs: 1.4 10*3/uL — ABNORMAL LOW (ref 1.7–7.7)
Neutrophils Relative %: 48 %
Platelets: 163 10*3/uL (ref 150–400)
RBC: 2.87 MIL/uL — ABNORMAL LOW (ref 3.87–5.11)
RDW: 12.8 % (ref 11.5–15.5)
WBC: 2.8 10*3/uL — ABNORMAL LOW (ref 4.0–10.5)
nRBC: 0 % (ref 0.0–0.2)

## 2020-05-02 LAB — COMPREHENSIVE METABOLIC PANEL
ALT: 14 U/L (ref 0–44)
AST: 16 U/L (ref 15–41)
Albumin: 4 g/dL (ref 3.5–5.0)
Alkaline Phosphatase: 45 U/L (ref 38–126)
Anion gap: 10 (ref 5–15)
BUN: 40 mg/dL — ABNORMAL HIGH (ref 8–23)
CO2: 27 mmol/L (ref 22–32)
Calcium: 8.8 mg/dL — ABNORMAL LOW (ref 8.9–10.3)
Chloride: 101 mmol/L (ref 98–111)
Creatinine, Ser: 1.89 mg/dL — ABNORMAL HIGH (ref 0.44–1.00)
GFR calc Af Amer: 30 mL/min — ABNORMAL LOW (ref >60)
GFR calc non Af Amer: 26 mL/min — ABNORMAL LOW (ref >60)
Glucose, Bld: 178 mg/dL — ABNORMAL HIGH (ref 70–99)
Potassium: 4.7 mmol/L (ref 3.5–5.1)
Sodium: 138 mmol/L (ref 135–145)
Total Bilirubin: 0.7 mg/dL (ref 0.3–1.2)
Total Protein: 6.5 g/dL (ref 6.5–8.1)

## 2020-05-02 LAB — IRON AND TIBC
Iron: 92 ug/dL (ref 28–170)
Saturation Ratios: 34 % — ABNORMAL HIGH (ref 10.4–31.8)
TIBC: 272 ug/dL (ref 250–450)
UIBC: 180 ug/dL

## 2020-05-02 LAB — LACTATE DEHYDROGENASE: LDH: 127 U/L (ref 98–192)

## 2020-05-02 LAB — FOLATE: Folate: 30.1 ng/mL (ref >5.9)

## 2020-05-02 LAB — FERRITIN: Ferritin: 597 ng/mL — ABNORMAL HIGH (ref 11–307)

## 2020-05-02 LAB — VITAMIN B12: Vitamin B-12: 356 pg/mL (ref 180–914)

## 2020-05-03 ENCOUNTER — Encounter (HOSPITAL_COMMUNITY): Payer: Self-pay | Admitting: Hematology

## 2020-05-03 ENCOUNTER — Other Ambulatory Visit: Payer: Self-pay

## 2020-05-03 ENCOUNTER — Inpatient Hospital Stay (HOSPITAL_BASED_OUTPATIENT_CLINIC_OR_DEPARTMENT_OTHER): Payer: Medicare Other | Admitting: Hematology

## 2020-05-03 VITALS — BP 164/68 | HR 58 | Temp 97.5°F | Resp 18 | Wt 171.0 lb

## 2020-05-03 DIAGNOSIS — C9000 Multiple myeloma not having achieved remission: Secondary | ICD-10-CM

## 2020-05-03 LAB — KAPPA/LAMBDA LIGHT CHAINS
Kappa free light chain: 28.8 mg/L — ABNORMAL HIGH (ref 3.3–19.4)
Kappa, lambda light chain ratio: 1.36 (ref 0.26–1.65)
Lambda free light chains: 21.2 mg/L (ref 5.7–26.3)

## 2020-05-03 LAB — PROTEIN ELECTROPHORESIS, SERUM
A/G Ratio: 1.6 (ref 0.7–1.7)
Albumin ELP: 4 g/dL (ref 2.9–4.4)
Alpha-1-Globulin: 0.2 g/dL (ref 0.0–0.4)
Alpha-2-Globulin: 0.7 g/dL (ref 0.4–1.0)
Beta Globulin: 0.9 g/dL (ref 0.7–1.3)
Gamma Globulin: 0.8 g/dL (ref 0.4–1.8)
Globulin, Total: 2.5 g/dL (ref 2.2–3.9)
Total Protein ELP: 6.5 g/dL (ref 6.0–8.5)

## 2020-05-03 NOTE — Patient Instructions (Signed)
[facility] at [facility] Discharge Instructions  You were seen today by Dr. Katragadda. He went over your recent results. Dr. Katragadda will see you back in 2 months for labs and follow up.   Thank you for choosing [facility] at [facility] to provide your oncology and hematology care.  To afford each patient quality time with our provider, please arrive at least 15 minutes before your scheduled appointment time.   If you have a lab appointment with the Cancer Center please come in thru the Main Entrance and check in at the main information desk  You need to re-schedule your appointment should you arrive 10 or more minutes late.  We strive to give you quality time with our providers, and arriving late affects you and other patients whose appointments are after yours.  Also, if you no show three or more times for appointments you may be dismissed from the clinic at the providers discretion.     Again, thank you for choosing [facility].  Our hope is that these requests will decrease the amount of time that you wait before being seen by our physicians.       _____________________________________________________________  Should you have questions after your visit to [facility], please contact our office at [phone] between the hours of 8:00 a.m. and 4:30 p.m.  Voicemails left after 4:00 p.m. will not be returned until the following business day.  For prescription refill requests, have your pharmacy contact our office and allow 72 hours.    Cancer Center Support Programs:   > Cancer Support Group  2nd Tuesday of the month 1pm-2pm, Journey Room   

## 2020-05-03 NOTE — Progress Notes (Signed)
Alexandra Price, [place] [address]   CLINIC:  Medical Oncology/Hematology  PCP:  [facility] / [place] [address]  [phone]  REASON FOR VISIT:  Follow-up for multiple myeloma  PRIOR THERAPY:  1. KPD x 4 cycles from 04/17/2018 to 08/21/2018. 2. Stem cell transplant on 10/23/2018.  CURRENT THERAPY: Maintenance Pomalyst 3 weeks on, 1 week off  INTERVAL HISTORY:  Alexandra Price, a 74 y.o. female, returns for routine follow-up for her multiple myeloma. Makayela was last seen on 03/15/2020.  Today she reports that she went to [facility] on 9/1 and is due for follow up and bone marrow biopsy in March 2022. She is tolerating the Pomalyst well without any significant issues. She was started on Eliquis since 9/4 because she had a blood clot somewhere in her lungs and she is currently wearing a Holter monitor for 2 weeks. She also reports having 1 episode of diarrhea last week when she saw bright red blood; she denies having any more such episodes. She continues having pain in her posterior pelvis, which is stable and controlled with Norco.  She has received both of her COVID vaccines.   REVIEW OF SYSTEMS:  Review of Systems  Constitutional: Positive for fatigue (moderate). Negative for appetite change.  Respiratory: Positive for shortness of breath (w/ exertion).   Cardiovascular: Positive for leg swelling (& ankles).  Gastrointestinal: Positive for diarrhea.  Genitourinary: Positive for frequency.   Musculoskeletal: Positive for back pain (4/10 lower back pain).  Neurological: Positive for headaches (L side).  Hematological: Bruises/bleeds easily.  Psychiatric/Behavioral: Positive for depression and sleep disturbance. The patient is nervous/anxious.   All other systems reviewed and are negative.   PAST MEDICAL/SURGICAL HISTORY:  Past Medical History:  Diagnosis Date  . Anxiety   . Depression     . Hypertension   . Multiple myeloma ([place])    multiple myeloma   Past Surgical History:  Procedure Laterality Date  . ABDOMINAL HYSTERECTOMY     total  . APPENDECTOMY    . LAPAROSCOPIC APPENDECTOMY N/A 05/20/2018   Procedure: APPENDECTOMY LAPAROSCOPIC;  Surgeon: [name], MD;  Location: [facility];  Service: General;  Laterality: N/A;  . PORTACATH PLACEMENT Right 04/14/2018   Procedure: INSERTION PORT-A-CATH;  Surgeon: [name], MD;  Location: [facility];  Service: General;  Laterality: Right;    SOCIAL HISTORY:  Social History   Socioeconomic History  . Marital status: Legally Separated    Spouse name: Not on file  . Number of children: 6  . Years of education: Not on file  . Highest education level: Not on file  Occupational History    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  Tobacco Use  . Smoking status: Never Smoker  . Smokeless tobacco: Never Used  Vaping Use  . Vaping Use: Never used  Substance and Sexual Activity  . Alcohol use: Never  . Drug use: Not Currently  . Sexual activity: Not Currently  Other Topics Concern  . Not on file  Social History Narrative  . Not on file   Social Determinants of Health   Financial Resource Strain:   . Difficulty of Paying Living Expenses: Not on file  Food Insecurity:   . Worried About Charity fundraiser in the Last Year: Not on file  . Ran Out of Food in the Last Year: Not on file  Transportation Needs:   . Lack of Transportation (Medical): Not on  file  . Lack of Transportation (Non-Medical): Not on file  Physical Activity:   . Days of Exercise per Week: Not on file  . Minutes of Exercise per Session: Not on file  Stress:   . Feeling of Stress : Not on file  Social Connections:   . Frequency of Communication with Friends and Family: Not on file  . Frequency of Social Gatherings with Friends and Family: Not on file  . Attends Religious Services: Not on file  . Active Member of Clubs or Organizations: Not on file  .  Attends Archivist Meetings: Not on file  . Marital Status: Not on file  Intimate Partner Violence:   . Fear of Current or Ex-Partner: Not on file  . Emotionally Abused: Not on file  . Physically Abused: Not on file  . Sexually Abused: Not on file    FAMILY HISTORY:  Family History  Problem Relation Age of Onset  . Heart disease Mother   . Emphysema Father   . Diabetes Sister   . Depression Sister   . Cancer Brother        liver, lung, and colon  . Diabetes Brother     CURRENT MEDICATIONS:  Current Outpatient Medications  Medication Sig Dispense Refill  . APIXABAN (ELIQUIS) VTE STARTER PACK (10MG AND 5MG) Take 2 tablets (64m) by mouth twice daily for 6 days, then take 1 tablet (5 mg) twice daily.    .Marland Kitchenacyclovir (ZOVIRAX) 400 MG tablet Take 1 tablet (400 mg total) by mouth 2 (two) times daily. 60 tablet 4  . ALPRAZolam (XANAX) 0.5 MG tablet Take one tablet during the day as needed for anxiety and two tablets at bedtime as needed for anxiety/insomnia 90 tablet 3  . aspirin EC 81 MG tablet Take 81 mg by mouth daily.    . Calcium Carb-Cholecalciferol (CALCIUM 1000 + D PO) Take 1,000 mg by mouth daily. Take one tablet daily until next visit with oncologist.    . citalopram (CELEXA) 40 MG tablet Take 1 tablet (40 mg total) by mouth daily. 90 tablet 1  . glipiZIDE (GLUCOTROL XL) 5 MG 24 hr tablet Take 1 tablet by mouth once daily with breakfast 90 tablet 2  . HYDROcodone-acetaminophen (NORCO) 7.5-325 MG tablet Take 1 tablet by mouth every 6 (six) hours as needed for moderate pain. 120 tablet 0  . methylPREDNISolone (MEDROL DOSEPAK) 4 MG TBPK tablet Use as directed 1 each 0  . Multiple Vitamin (THERA) TABS Take 1 tablet by mouth daily. 30 tablet 5  . ondansetron (ZOFRAN) 4 MG tablet Take 1 tablet (4 mg total) by mouth every 8 (eight) hours as needed for nausea or vomiting. (Patient not taking: Reported on 03/15/2020) 20 tablet 1  . pantoprazole (PROTONIX) 40 MG tablet Take 1  tablet (40 mg total) by mouth daily. 30 tablet 2  . pomalidomide (POMALYST) 2 MG capsule TAKE 1 CAPSULE BY MOUTH ONCE DAILY FOR 21 DAYS ON AND 7 DAYS OFF 21 capsule 1  . POMALYST 2 MG capsule TAKE 1 CAPSULE BY MOUTH ONCE DAILY FOR 21 DAYS ON AND 7 DAYS OFF 21 capsule 1  . terbinafine (LAMISIL) 1 % cream Apply 1 application topically 2 (two) times daily. 30 g 0  . zolpidem (AMBIEN) 10 MG tablet Take 1 tablet (10 mg total) by mouth at bedtime as needed for sleep. 30 tablet 2   No current facility-administered medications for this visit.    ALLERGIES:  Allergies  Allergen Reactions  .  Ciprofloxacin Anaphylaxis  . Amoxicillin   . Morphine   . Morphine And Related Nausea And Vomiting  . Augmentin [Amoxicillin-Pot Clavulanate] Other (See Comments)    Headache, insomnia    PHYSICAL EXAM:  Performance status (ECOG): 1 - Symptomatic but completely ambulatory  Vitals:   05/03/20 1100  BP: (!) 164/68  Pulse: (!) 58  Resp: 18  Temp: (!) 97.5 F (36.4 C)  SpO2: 100%   Wt Readings from Last 3 Encounters:  05/03/20 171 lb (77.6 kg)  03/15/20 169 lb 12.8 oz (77 kg)  01/14/20 171 lb 1.6 oz (77.6 kg)   Physical Exam Vitals reviewed.  Constitutional:      Appearance: Normal appearance.  Cardiovascular:     Rate and Rhythm: Normal rate and regular rhythm.     Pulses: Normal pulses.     Heart sounds: Normal heart sounds.  Pulmonary:     Effort: Pulmonary effort is normal.     Breath sounds: Normal breath sounds.  Neurological:     General: No focal deficit present.     Mental Status: She is alert and oriented to person, place, and time.  Psychiatric:        Mood and Affect: Mood normal.        Behavior: Behavior normal.     LABORATORY DATA:  I have reviewed the labs as listed.  CBC Latest Ref Rng & Units 05/02/2020 03/08/2020 01/07/2020  WBC 4.0 - 10.5 K/uL 2.8(L) 3.8(L) 3.3(L)  Hemoglobin 12.0 - 15.0 g/dL 10.0(L) 10.0(L) 9.3(L)  Hematocrit 36 - 46 % 31.4(L) 31.1(L) 28.9(L)    Platelets 150 - 400 K/uL 163 156 138(L)   CMP Latest Ref Rng & Units 05/02/2020 03/08/2020 01/07/2020  Glucose 70 - 99 mg/dL 178(H) 118(H) 123(H)  BUN 8 - 23 mg/dL 40(H) 29(H) 32(H)  Creatinine 0.44 - 1.00 mg/dL 1.89(H) 1.87(H) 2.21(H)  Sodium 135 - 145 mmol/L 138 139 139  Potassium 3.5 - 5.1 mmol/L 4.7 4.2 5.0  Chloride 98 - 111 mmol/L 101 102 103  CO2 22 - 32 mmol/L _0 Calcium 8.9 - 10.3 mg/dL 8.8(L) 9.3 9.3  Total Protein 6.5 - 8.1 g/dL 6.5 7.0 6.7  Total Bilirubin 0.3 - 1.2 mg/dL 0.7 0.6 0.5  Alkaline Phos 38 - 126 U/L 45 52 56  AST 15 - 41 U/L _1 ALT 0 - 44 U/L _2 Component Value Date/Time   RBC 2.87 (L) 05/02/2020 0939   MCV 109.4 (H) 05/02/2020 0939   MCH 34.8 (H) 05/02/2020 0939   MCHC 31.8 05/02/2020 0939   RDW 12.8 05/02/2020 0939   LYMPHSABS 0.9 05/02/2020 0939   MONOABS 0.4 05/02/2020 0939   EOSABS 0.2 05/02/2020 0939   BASOSABS 0.1 05/02/2020 0939   Lab Results  Component Value Date   LDH 127 05/02/2020   LDH 122 03/08/2020   LDH 117 01/07/2020   Lab Results  Component Value Date   TOTALPROTELP 6.5 03/08/2020   TOTALPROTELP 6.3 03/08/2020   ALBUMINELP 3.8 03/08/2020   A1GS 0.2 03/08/2020   A2GS 0.7 03/08/2020   BETS 0.9 03/08/2020   GAMS 0.8 03/08/2020   MSPIKE Not Observed 03/08/2020   SPEI Comment 03/08/2020    Lab Results  Component Value Date   KPAFRELGTCHN 32.2 (H) 03/08/2020   LAMBDASER 27.5 (H) 03/08/2020   KAPLAMBRATIO 1.17 03/08/2020   Lab Results  Component Value Date   TIBC 272 05/02/2020   TIBC 256 03/08/2020  TIBC 235 (L) 01/07/2020   FERRITIN 597 (H) 05/02/2020   FERRITIN 526 (H) 03/08/2020   FERRITIN 694 (H) 01/07/2020   IRONPCTSAT 34 (H) 05/02/2020   IRONPCTSAT 16 03/08/2020   IRONPCTSAT 36 (H) 01/07/2020    DIAGNOSTIC IMAGING:  I have independently reviewed the scans and discussed with the patient. No results found.   ASSESSMENT:  1. IgG lambda plasma cell myeloma, stage II, standard  risk: -4 cycles of KPD from 04/17/2018 through 08/21/2018, stem cell transplant on 10/23/2018. -PET scan on 01/21/2019 showed multiple bone lesions but without any hypermetabolic activity. -BMBX on 01/21/2019 with normocellular marrow with no increase in plasma cells. Normal FISH. MRD results negative. -Maintenance pomalidomide 2 mg 3 weeks on/1 week off started on 03/10/2019. -Bone marrow biopsy on 10/29/2019 shows trilineage hematopoiesis with no evidence of plasma cells. Chromosome analysis and FISH are normal.   PLAN:  1. IgG lambda plasma cell myeloma: -She is tolerating pomalidomide maintenance 2 mg 3 weeks on 1 week off very well. -M spike today was undetectable.  Kappa light chains are 28.8 with ratio of 1.36.  Immunofixation is pending. -She will continue pomalidomide maintenance.  She will come back in 2 months with repeat labs.  2. Back pain: -Continue hydrocodone 7.5 mg every 6 hours as needed.  3. Myeloma bone disease: -Denosumab on hold due to exposure of bone in the right lower jaw molar area.  Continue calcium and vitamin D.  4. Anxiety: -Continue Xanax 0.5 mg twice daily as needed.  5. Sleeping difficulty: -Continue Ambien 10 mg daily as needed.  6. Normocytic anemia: -Combination anemia from CKD, myelosuppression from pomalidomide. -Ferritin is 597 and percent saturation is 34.  Folic acid and G38 was normal.  Hemoglobin today is 10.  7.  Pulmonary embolism: -CT angiogram on 04/21/2020 showed filling defect at [facility] [place]. -VQ scan confirmed pulmonary embolism.  She was started on Eliquis. -She is tolerating it well.  Orders placed this encounter:  No orders of the defined types were placed in this encounter.    [name], MD [place] [phone]   I, Milinda Antis, am acting as a scribe for Dr. Sanda Linger.  I, [name] MD, have reviewed the above documentation for accuracy and completeness,  and I agree with the above.

## 2020-05-04 LAB — IMMUNOFIXATION ELECTROPHORESIS
IgA: 71 mg/dL (ref 64–422)
IgG (Immunoglobin G), Serum: 776 mg/dL (ref 586–1602)
IgM (Immunoglobulin M), Srm: 19 mg/dL — ABNORMAL LOW (ref 26–217)
Total Protein ELP: 6.6 g/dL (ref 6.0–8.5)

## 2020-05-09 ENCOUNTER — Other Ambulatory Visit (HOSPITAL_COMMUNITY): Payer: Medicare Other

## 2020-05-09 ENCOUNTER — Other Ambulatory Visit (HOSPITAL_COMMUNITY): Payer: Self-pay

## 2020-05-09 DIAGNOSIS — C9000 Multiple myeloma not having achieved remission: Secondary | ICD-10-CM

## 2020-05-09 MED ORDER — HYDROCODONE-ACETAMINOPHEN 7.5-325 MG PO TABS: 1.0000 | ORAL_TABLET | Freq: Four times a day (QID) | ORAL | 0 refills | Status: DC | PRN

## 2020-05-16 ENCOUNTER — Other Ambulatory Visit (HOSPITAL_COMMUNITY): Payer: Self-pay

## 2020-05-16 ENCOUNTER — Ambulatory Visit (HOSPITAL_COMMUNITY): Payer: Medicare Other | Admitting: Hematology

## 2020-05-16 DIAGNOSIS — R42 Dizziness and giddiness: Secondary | ICD-10-CM

## 2020-05-16 DIAGNOSIS — C9000 Multiple myeloma not having achieved remission: Secondary | ICD-10-CM

## 2020-05-16 MED ORDER — THERA PO TABS: 1.0000 | ORAL_TABLET | Freq: Every day | ORAL | 5 refills | Status: DC

## 2020-05-16 MED ORDER — PANTOPRAZOLE SODIUM 40 MG PO TBEC: 40.0000 mg | DELAYED_RELEASE_TABLET | Freq: Every day | ORAL | 2 refills | Status: DC

## 2020-05-16 NOTE — Telephone Encounter (Signed)
Patient called for refills on pantoprazole and thera vitamin.  Chart checked and filled.  Next appointment 06/29/2020.

## 2020-05-20 ENCOUNTER — Other Ambulatory Visit (HOSPITAL_COMMUNITY): Payer: Self-pay | Admitting: Hematology

## 2020-05-20 DIAGNOSIS — C9000 Multiple myeloma not having achieved remission: Secondary | ICD-10-CM

## 2020-05-27 ENCOUNTER — Other Ambulatory Visit (HOSPITAL_COMMUNITY): Payer: Self-pay

## 2020-05-27 DIAGNOSIS — G479 Sleep disorder, unspecified: Secondary | ICD-10-CM

## 2020-05-27 MED ORDER — ZOLPIDEM TARTRATE 10 MG PO TABS: 10.0000 mg | ORAL_TABLET | Freq: Every evening | ORAL | 2 refills | Status: DC | PRN

## 2020-06-06 ENCOUNTER — Other Ambulatory Visit (HOSPITAL_COMMUNITY): Payer: Self-pay

## 2020-06-06 DIAGNOSIS — C9000 Multiple myeloma not having achieved remission: Secondary | ICD-10-CM

## 2020-06-06 MED ORDER — HYDROCODONE-ACETAMINOPHEN 7.5-325 MG PO TABS: 1.0000 | ORAL_TABLET | Freq: Four times a day (QID) | ORAL | 0 refills | Status: DC | PRN

## 2020-06-17 ENCOUNTER — Other Ambulatory Visit (HOSPITAL_COMMUNITY): Payer: Self-pay | Admitting: Hematology

## 2020-06-17 DIAGNOSIS — C9000 Multiple myeloma not having achieved remission: Secondary | ICD-10-CM

## 2020-06-17 NOTE — Telephone Encounter (Signed)
Chart reviewed. Pomalyst refilled per Dr. Delton Coombes

## 2020-06-21 ENCOUNTER — Other Ambulatory Visit (HOSPITAL_COMMUNITY): Payer: Self-pay

## 2020-06-21 ENCOUNTER — Ambulatory Visit (HOSPITAL_COMMUNITY)
Admission: RE | Admit: 2020-06-21 | Discharge: 2020-06-21 | Disposition: A | Discharge status: Home / Self Care | Payer: Medicare Other | Source: Ambulatory Visit | Attending: Hematology | Admitting: Hematology

## 2020-06-21 ENCOUNTER — Other Ambulatory Visit: Payer: Self-pay

## 2020-06-21 DIAGNOSIS — C9000 Multiple myeloma not having achieved remission: Secondary | ICD-10-CM | POA: Insufficient documentation

## 2020-06-21 NOTE — Progress Notes (Signed)
Call received from patient reporting right arm pain with movement lasting at least one month. Patient reports no redness, swelling, or recent injury. Dr. Delton Coombes made aware, orders received for a xray. Patient aware.

## 2020-06-23 ENCOUNTER — Encounter (HOSPITAL_COMMUNITY): Payer: Self-pay

## 2020-06-23 NOTE — Progress Notes (Signed)
Patient called inquiring about the results of her recent XRay. Results reviewed by Dr. Delton Coombes, who states that there is no fracture present and recommends taking ibuprofen 400mg  once daily. I call the patient and alert her of these results and Dr. Tomie China recommendation. Patient verbalized understanding.

## 2020-06-29 ENCOUNTER — Inpatient Hospital Stay (HOSPITAL_COMMUNITY): Payer: Medicare Other | Attending: Hematology

## 2020-06-29 ENCOUNTER — Other Ambulatory Visit: Payer: Self-pay

## 2020-06-29 DIAGNOSIS — G893 Neoplasm related pain (acute) (chronic): Secondary | ICD-10-CM | POA: Diagnosis not present

## 2020-06-29 DIAGNOSIS — C9000 Multiple myeloma not having achieved remission: Secondary | ICD-10-CM | POA: Diagnosis present

## 2020-06-29 DIAGNOSIS — Z7982 Long term (current) use of aspirin: Secondary | ICD-10-CM | POA: Insufficient documentation

## 2020-06-29 DIAGNOSIS — F32A Depression, unspecified: Secondary | ICD-10-CM | POA: Diagnosis not present

## 2020-06-29 DIAGNOSIS — E119 Type 2 diabetes mellitus without complications: Secondary | ICD-10-CM | POA: Insufficient documentation

## 2020-06-29 DIAGNOSIS — I2699 Other pulmonary embolism without acute cor pulmonale: Secondary | ICD-10-CM | POA: Insufficient documentation

## 2020-06-29 DIAGNOSIS — Z9484 Stem cells transplant status: Secondary | ICD-10-CM | POA: Diagnosis not present

## 2020-06-29 DIAGNOSIS — Z7984 Long term (current) use of oral hypoglycemic drugs: Secondary | ICD-10-CM | POA: Insufficient documentation

## 2020-06-29 DIAGNOSIS — D72819 Decreased white blood cell count, unspecified: Secondary | ICD-10-CM | POA: Diagnosis not present

## 2020-06-29 DIAGNOSIS — I129 Hypertensive chronic kidney disease with stage 1 through stage 4 chronic kidney disease, or unspecified chronic kidney disease: Secondary | ICD-10-CM | POA: Diagnosis not present

## 2020-06-29 DIAGNOSIS — Z23 Encounter for immunization: Secondary | ICD-10-CM | POA: Insufficient documentation

## 2020-06-29 DIAGNOSIS — N189 Chronic kidney disease, unspecified: Secondary | ICD-10-CM | POA: Insufficient documentation

## 2020-06-29 DIAGNOSIS — Z79899 Other long term (current) drug therapy: Secondary | ICD-10-CM | POA: Diagnosis not present

## 2020-06-29 DIAGNOSIS — F419 Anxiety disorder, unspecified: Secondary | ICD-10-CM | POA: Diagnosis not present

## 2020-06-29 DIAGNOSIS — Z7901 Long term (current) use of anticoagulants: Secondary | ICD-10-CM | POA: Insufficient documentation

## 2020-06-29 LAB — COMPREHENSIVE METABOLIC PANEL
ALT: 14 U/L (ref 0–44)
AST: 18 U/L (ref 15–41)
Albumin: 4 g/dL (ref 3.5–5.0)
Alkaline Phosphatase: 50 U/L (ref 38–126)
Anion gap: 8 (ref 5–15)
BUN: 29 mg/dL — ABNORMAL HIGH (ref 8–23)
CO2: 27 mmol/L (ref 22–32)
Calcium: 8.9 mg/dL (ref 8.9–10.3)
Chloride: 102 mmol/L (ref 98–111)
Creatinine, Ser: 1.74 mg/dL — ABNORMAL HIGH (ref 0.44–1.00)
GFR, Estimated: 31 mL/min — ABNORMAL LOW (ref >60)
Glucose, Bld: 125 mg/dL — ABNORMAL HIGH (ref 70–99)
Potassium: 4.3 mmol/L (ref 3.5–5.1)
Sodium: 137 mmol/L (ref 135–145)
Total Bilirubin: 0.4 mg/dL (ref 0.3–1.2)
Total Protein: 6.7 g/dL (ref 6.5–8.1)

## 2020-06-29 LAB — CBC WITH DIFFERENTIAL/PLATELET
Abs Immature Granulocytes: 0 10*3/uL (ref 0.00–0.07)
Basophils Absolute: 0.1 10*3/uL (ref 0.0–0.1)
Basophils Relative: 2 %
Eosinophils Absolute: 0.1 10*3/uL (ref 0.0–0.5)
Eosinophils Relative: 5 %
HCT: 29.3 % — ABNORMAL LOW (ref 36.0–46.0)
Hemoglobin: 9.6 g/dL — ABNORMAL LOW (ref 12.0–15.0)
Immature Granulocytes: 0 %
Lymphocytes Relative: 35 %
Lymphs Abs: 1 10*3/uL (ref 0.7–4.0)
MCH: 34.7 pg — ABNORMAL HIGH (ref 26.0–34.0)
MCHC: 32.8 g/dL (ref 30.0–36.0)
MCV: 105.8 fL — ABNORMAL HIGH (ref 80.0–100.0)
Monocytes Absolute: 0.5 10*3/uL (ref 0.1–1.0)
Monocytes Relative: 17 %
Neutro Abs: 1.2 10*3/uL — ABNORMAL LOW (ref 1.7–7.7)
Neutrophils Relative %: 41 %
Platelets: 158 10*3/uL (ref 150–400)
RBC: 2.77 MIL/uL — ABNORMAL LOW (ref 3.87–5.11)
RDW: 12.4 % (ref 11.5–15.5)
WBC: 2.8 10*3/uL — ABNORMAL LOW (ref 4.0–10.5)
nRBC: 0 % (ref 0.0–0.2)

## 2020-06-29 LAB — LACTATE DEHYDROGENASE: LDH: 125 U/L (ref 98–192)

## 2020-06-29 NOTE — Progress Notes (Signed)
Patients port flushed without difficulty.  Good blood return noted with no bruising or swelling noted at site.  Band aid applied.  VSS with discharge and left in satisfactory condition with no s/s of distress noted.   

## 2020-06-30 LAB — KAPPA/LAMBDA LIGHT CHAINS
Kappa free light chain: 26.4 mg/L — ABNORMAL HIGH (ref 3.3–19.4)
Kappa, lambda light chain ratio: 1.35 (ref 0.26–1.65)
Lambda free light chains: 19.5 mg/L (ref 5.7–26.3)

## 2020-07-06 ENCOUNTER — Other Ambulatory Visit: Payer: Self-pay

## 2020-07-06 ENCOUNTER — Inpatient Hospital Stay (HOSPITAL_BASED_OUTPATIENT_CLINIC_OR_DEPARTMENT_OTHER): Payer: Medicare Other | Admitting: Hematology

## 2020-07-06 ENCOUNTER — Encounter (HOSPITAL_COMMUNITY): Payer: Self-pay | Admitting: Hematology

## 2020-07-06 ENCOUNTER — Other Ambulatory Visit (HOSPITAL_COMMUNITY): Payer: Self-pay | Admitting: *Deleted

## 2020-07-06 VITALS — BP 155/75 | HR 65 | Temp 97.3°F | Resp 18 | Wt 173.1 lb

## 2020-07-06 DIAGNOSIS — C9000 Multiple myeloma not having achieved remission: Secondary | ICD-10-CM

## 2020-07-06 MED ORDER — INFLUENZA VAC A&B SA ADJ QUAD 0.5 ML IM PRSY
0.5000 mL | PREFILLED_SYRINGE | Freq: Once | INTRAMUSCULAR | Status: AC
  Administered 2020-07-06: 0.5 mL via INTRAMUSCULAR
  Filled 2020-07-06: qty 0.5

## 2020-07-06 MED ORDER — ACYCLOVIR 400 MG PO TABS: 400.0000 mg | ORAL_TABLET | Freq: Two times a day (BID) | ORAL | 4 refills | Status: DC

## 2020-07-06 NOTE — Progress Notes (Signed)
[place], [place] [address]   CLINIC:  Medical Oncology/Hematology  PCP:  [facility] / [place] [id number]  [phone]  REASON FOR VISIT:  Follow-up for multiple myeloma  PRIOR THERAPY:  1. KPD x 4 cycles from 04/17/2018 to 08/21/2018. 2. Stem cell transplant on 10/23/2018.  CURRENT THERAPY: Maintenance Pomalyst 3 weeks on, 1 week off  INTERVAL HISTORY:  [name], a 74 y.o. female, returns for routine follow-up for her multiple myeloma. Tessy was last seen on 05/03/2020.  Today she reports feeling okay. She reports having pain in her right upper humerus and shoulder which started spontaneously 1 month ago and takes Norco every 6 hours along with ibuprofen in between. She cannot pick up anything heavy with her right arm and reports certain positions will aggravate the pain. She is tolerating the Pomalyst well and takes it 3 weeks on with 1 week off. She continues taking Eliquis daily and denies having nosebleeds, hematochezia or hematuria.   REVIEW OF SYSTEMS:  Review of Systems  Constitutional: Positive for appetite change (90%) and fatigue (50%).  HENT:   Negative for nosebleeds.   Gastrointestinal: Negative for blood in stool.  Genitourinary: Negative for hematuria.   Musculoskeletal: Positive for back pain (8/10 back, R shoulder and arm pain).  All other systems reviewed and are negative.   PAST MEDICAL/SURGICAL HISTORY:  Past Medical History:  Diagnosis Date  . Anxiety   . Depression   . Hypertension   . Multiple myeloma ([place])    multiple myeloma   Past Surgical History:  Procedure Laterality Date  . ABDOMINAL HYSTERECTOMY     total  . APPENDECTOMY    . LAPAROSCOPIC APPENDECTOMY N/A 05/20/2018   Procedure: APPENDECTOMY LAPAROSCOPIC;  Surgeon: [name], MD;  Location: [facility];  Service: General;  Laterality: N/A;  . PORTACATH PLACEMENT Right 04/14/2018   Procedure:  INSERTION PORT-A-CATH;  Surgeon: [name], MD;  Location: [facility];  Service: General;  Laterality: Right;    SOCIAL HISTORY:  Social History   Socioeconomic History  . Marital status: Legally Separated    Spouse name: Not on file  . Number of children: 6  . Years of education: Not on file  . Highest education level: Not on file  Occupational History    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  Tobacco Use  . Smoking status: Never Smoker  . Smokeless tobacco: Never Used  Vaping Use  . Vaping Use: Never used  Substance and Sexual Activity  . Alcohol use: Never  . Drug use: Not Currently  . Sexual activity: Not Currently  Other Topics Concern  . Not on file  Social History Narrative  . Not on file   Social Determinants of Health   Financial Resource Strain: Low Risk   . Difficulty of Paying Living Expenses: Not hard at all  Food Insecurity: No Food Insecurity  . Worried About Charity fundraiser in the Last Year: Never true  . Ran Out of Food in the Last Year: Never true  Transportation Needs: No Transportation Needs  . Lack of Transportation (Medical): No  . Lack of Transportation (Non-Medical): No  Physical Activity: Inactive  . Days of Exercise per Week: 0 days  . Minutes of Exercise per Session: 0 min  Stress: No Stress Concern Present  . Feeling of Stress : Not at all  Social Connections: Moderately Isolated  . Frequency of Communication with Friends and  Family: More than three times a week  . Frequency of Social Gatherings with Friends and Family: Twice a week  . Attends Religious Services: 1 to 4 times per year  . Active Member of Clubs or Organizations: No  . Attends Archivist Meetings: Never  . Marital Status: Separated  Intimate Partner Violence: Not At Risk  . Fear of Current or Ex-Partner: No  . Emotionally Abused: No  . Physically Abused: No  . Sexually Abused: No    FAMILY HISTORY:  Family History  Problem Relation Age of Onset  . Heart  disease Mother   . Emphysema Father   . Diabetes Sister   . Depression Sister   . Cancer Brother        liver, lung, and colon  . Diabetes Brother     CURRENT MEDICATIONS:  Current Outpatient Medications  Medication Sig Dispense Refill  . acyclovir (ZOVIRAX) 400 MG tablet Take 1 tablet (400 mg total) by mouth 2 (two) times daily. 60 tablet 4  . ALPRAZolam (XANAX) 0.5 MG tablet Take one tablet during the day as needed for anxiety and two tablets at bedtime as needed for anxiety/insomnia 90 tablet 3  . aspirin EC 81 MG tablet Take 81 mg by mouth daily.    . Calcium Carb-Cholecalciferol (CALCIUM 1000 + D PO) Take 1,000 mg by mouth daily. Take one tablet daily until next visit with oncologist.    . citalopram (CELEXA) 40 MG tablet Take 1 tablet (40 mg total) by mouth daily. 90 tablet 1  . ELIQUIS 5 MG TABS tablet Take 5 mg by mouth 2 (two) times daily.    Marland Kitchen glipiZIDE (GLUCOTROL XL) 5 MG 24 hr tablet Take 1 tablet by mouth once daily with breakfast 90 tablet 2  . HYDROcodone-acetaminophen (NORCO) 7.5-325 MG tablet Take 1 tablet by mouth every 6 (six) hours as needed for moderate pain. 120 tablet 0  . methylPREDNISolone (MEDROL DOSEPAK) 4 MG TBPK tablet Use as directed 1 each 0  . Multiple Vitamin (THERA) TABS Take 1 tablet by mouth daily. 30 tablet 5  . ondansetron (ZOFRAN) 4 MG tablet Take 1 tablet (4 mg total) by mouth every 8 (eight) hours as needed for nausea or vomiting. 20 tablet 1  . pantoprazole (PROTONIX) 40 MG tablet Take 1 tablet (40 mg total) by mouth daily. 30 tablet 2  . POMALYST 2 MG capsule TAKE 1 CAPSULE BY MOUTH ONCE DAILY FOR 21 DAYS ON AND 7 DAYS OFF 21 capsule 1  . POMALYST 2 MG capsule TAKE 1 CAPSULE BY MOUTH ONCE DAILY FOR 21 DAYS ON AND 7 DAYS OFF 21 capsule 1  . rosuvastatin (CRESTOR) 10 MG tablet Take by mouth.    . terbinafine (LAMISIL) 1 % cream Apply 1 application topically 2 (two) times daily. 30 g 0  . zolpidem (AMBIEN) 10 MG tablet Take 1 tablet (10 mg total) by  mouth at bedtime as needed for sleep. 30 tablet 2   No current facility-administered medications for this visit.    ALLERGIES:  Allergies  Allergen Reactions  . Ciprofloxacin Anaphylaxis  . Amoxicillin   . Morphine   . Morphine And Related Nausea And Vomiting  . Augmentin [Amoxicillin-Pot Clavulanate] Other (See Comments)    Headache, insomnia    PHYSICAL EXAM:  Performance status (ECOG): 1 - Symptomatic but completely ambulatory  Vitals:   07/06/20 1149  BP: (!) 155/75  Pulse: 65  Resp: 18  Temp: (!) 97.3 F (36.3 C)  SpO2: 96%  Wt Readings from Last 3 Encounters:  07/06/20 173 lb 1.6 oz (78.5 kg)  06/29/20 173 lb 14.4 oz (78.9 kg)  05/03/20 171 lb (77.6 kg)   Physical Exam Vitals reviewed.  Constitutional:      Appearance: Normal appearance.  Cardiovascular:     Rate and Rhythm: Normal rate and regular rhythm.     Pulses: Normal pulses.     Heart sounds: Normal heart sounds.  Pulmonary:     Effort: Pulmonary effort is normal.     Breath sounds: Normal breath sounds.  Musculoskeletal:     Right shoulder: Bony tenderness (superior and posterior shoulder) present. Decreased range of motion (behind back).  Neurological:     General: No focal deficit present.     Mental Status: She is alert and oriented to person, place, and time.  Psychiatric:        Mood and Affect: Mood normal.        Behavior: Behavior normal.     LABORATORY DATA:  I have reviewed the labs as listed.  CBC Latest Ref Rng & Units 06/29/2020 05/02/2020 03/08/2020  WBC 4.0 - 10.5 K/uL 2.8(L) 2.8(L) 3.8(L)  Hemoglobin 12.0 - 15.0 g/dL 9.6(L) 10.0(L) 10.0(L)  Hematocrit 36 - 46 % 29.3(L) 31.4(L) 31.1(L)  Platelets 150 - 400 K/uL 158 163 156   CMP Latest Ref Rng & Units 06/29/2020 05/02/2020 03/08/2020  Glucose 70 - 99 mg/dL 125(H) 178(H) 118(H)  BUN 8 - 23 mg/dL 29(H) 40(H) 29(H)  Creatinine 0.44 - 1.00 mg/dL 1.74(H) 1.89(H) 1.87(H)  Sodium 135 - 145 mmol/L 137 138 139  Potassium 3.5 - 5.1  mmol/L 4.3 4.7 4.2  Chloride 98 - 111 mmol/L 102 101 102  CO2 22 - 32 mmol/L 27 27 26   Calcium 8.9 - 10.3 mg/dL 8.9 8.8(L) 9.3  Total Protein 6.5 - 8.1 g/dL 6.7 6.5 7.0  Total Bilirubin 0.3 - 1.2 mg/dL 0.4 0.7 0.6  Alkaline Phos 38 - 126 U/L 50 45 52  AST 15 - 41 U/L 18 16 18   ALT 0 - 44 U/L 14 14 15       Component Value Date/Time   RBC 2.77 (L) 06/29/2020 1140   MCV 105.8 (H) 06/29/2020 1140   MCH 34.7 (H) 06/29/2020 1140   MCHC 32.8 06/29/2020 1140   RDW 12.4 06/29/2020 1140   LYMPHSABS 1.0 06/29/2020 1140   MONOABS 0.5 06/29/2020 1140   EOSABS 0.1 06/29/2020 1140   BASOSABS 0.1 06/29/2020 1140   Lab Results  Component Value Date   LDH 125 06/29/2020   LDH 127 05/02/2020   LDH 122 03/08/2020   Lab Results  Component Value Date   TOTALPROTELP 6.6 05/02/2020   TOTALPROTELP 6.5 05/02/2020   ALBUMINELP 4.0 05/02/2020   A1GS 0.2 05/02/2020   A2GS 0.7 05/02/2020   BETS 0.9 05/02/2020   GAMS 0.8 05/02/2020   MSPIKE Not Observed 05/02/2020   SPEI Comment 05/02/2020    Lab Results  Component Value Date   KPAFRELGTCHN 26.4 (H) 06/29/2020   LAMBDASER 19.5 06/29/2020   KAPLAMBRATIO 1.35 06/29/2020    DIAGNOSTIC IMAGING:  I have independently reviewed the scans and discussed with the patient. DG Humerus Right  Result Date: 06/23/2020 CLINICAL DATA:  Right arm pain for 1 month. The patient has a history of multiple myeloma. EXAM: RIGHT HUMERUS - 2+ VIEW COMPARISON:  None. FINDINGS: Multiple well-circumscribed punched-out lucencies in endosteal scalloping of the humerus are consistent with the provided history of multiple myeloma. There is no evidence of  acute fracture. IMPRESSION: Multiple well-circumscribed lucencies and endosteal scalloping of the humerus consistent with the provided history of multiple myeloma. No evidence of pathologic fracture. Electronically Signed   By: Zerita Boers M.D.   On: 06/23/2020 09:32     ASSESSMENT:  1. IgG lambda plasma cell myeloma,  stage II, standard risk: -4 cycles of KPD from 04/17/2018 through 08/21/2018, stem cell transplant on 10/23/2018. -PET scan on 01/21/2019 showed multiple bone lesions but without any hypermetabolic activity. -BMBX on 01/21/2019 with normocellular marrow with no increase in plasma cells. Normal FISH. MRD results negative. -Maintenance pomalidomide 2 mg 3 weeks on/1 week off started on 03/10/2019. -Bone marrow biopsy on 10/29/2019 shows trilineage hematopoiesis with no evidence of plasma cells. Chromosome analysis and FISH are normal.  2.  Pulmonary embolism: -CT angiogram on 04/21/2020 showed filling defect at [facility]. -VQ scan confirmed pulmonary embolism. -She is on Eliquis.   PLAN:  1. IgG lambda plasma cell myeloma: -She is tolerating pomalidomide maintenance 2 mg 3 weeks on 1 week off very well. -Free light chain ratio from 06/29/2020 reviewed by me shows elevated kappa light chains 26.4.  Lambda light chains are normal.  Ratio is normal. -Mild leukopenia with white count of 2.8 with ANC of 1.2 from pomalidomide. -Continue pomalidomide at the same dose level.. -RTC 2 months with repeat myeloma panel.  2. Back pain: -Continue hydrocodone 7.5 mg every 6 hours as needed.  Is well controlled.  3. Myeloma bone disease: -Denosumab on hold due to exposure of the bone in the right lower jaw molar area. -Continue calcium and vitamin D.  4. Anxiety: -Continue Xanax 0.5 mg twice daily as needed.  5. Sleeping difficulty: -Continue Ambien 10 mg daily as needed.  6. Normocytic anemia: -Combination anemia from CKD, myelosuppression from pomalidomide. -Hemoglobin today is 9.6.  7.  Pulmonary embolism: -Continue Eliquis.  No bleeding issues.  8.  Right shoulder and upper arm pain: -I have done x-rays of right humerus on 06/21/2020.  There are myeloma lesions but have improved from pretreatment skeletal survey.  No fractures. -On examination there is also tenderness over  the right shoulder joint. -Recommended MRI of the shoulder with and without contrast to further evaluate this.  Orders placed this encounter:  Orders Placed This Encounter  Procedures  . MR SHOULDER RIGHT W WO CONTRAST  . CBC with Differential/Platelet  . Comprehensive metabolic panel  . Kappa/lambda light chains  . Protein electrophoresis, serum  . Immunofixation electrophoresis  . Lactate dehydrogenase     [name], MD [facility] [phone]   I, Milinda Antis, am acting as a scribe for Dr. Sanda Linger.  I, [name] MD, have reviewed the above documentation for accuracy and completeness, and I agree with the above.

## 2020-07-06 NOTE — Patient Instructions (Signed)
[place] at [facility] Discharge Instructions  You were seen today by Dr. Delton Coombes. He went over your recent results and scans. You will be scheduled for an MRI of your right shoulder for further analysis. Dr. Delton Coombes will see you back in 2 months for labs and follow up.   Thank you for choosing [facility] at [facility] to provide your oncology and hematology care.  To afford each patient quality time with our provider, please arrive at least 15 minutes before your scheduled appointment time.   If you have a lab appointment with the [facility] please come in thru the Main Entrance and check in at the main information desk  You need to re-schedule your appointment should you arrive 10 or more minutes late.  We strive to give you quality time with our providers, and arriving late affects you and other patients whose appointments are after yours.  Also, if you no show three or more times for appointments you may be dismissed from the clinic at the providers discretion.     Again, thank you for choosing [facility].  Our hope is that these requests will decrease the amount of time that you wait before being seen by our physicians.       _____________________________________________________________  Should you have questions after your visit to [facility], please contact our office at [phone] between the hours of 8:00 a.m. and 4:30 p.m.  Voicemails left after 4:00 p.m. will not be returned until the following business day.  For prescription refill requests, have your pharmacy contact our office and allow 72 hours.    Cancer Center Support Programs:   > Cancer Support Group  2nd Tuesday of the month 1pm-2pm, Journey Room

## 2020-07-06 NOTE — Progress Notes (Signed)
Patient request flu shot today.  Fluad injection administered per MD orders.  Given to left Deltoid.  Tolerated well.  Patient ambulatory and in stable condition.

## 2020-07-11 ENCOUNTER — Other Ambulatory Visit (HOSPITAL_COMMUNITY): Payer: Self-pay

## 2020-07-11 DIAGNOSIS — C9000 Multiple myeloma not having achieved remission: Secondary | ICD-10-CM

## 2020-07-11 MED ORDER — HYDROCODONE-ACETAMINOPHEN 7.5-325 MG PO TABS: 1.0000 | ORAL_TABLET | Freq: Four times a day (QID) | ORAL | 0 refills | Status: DC | PRN

## 2020-07-13 ENCOUNTER — Other Ambulatory Visit (HOSPITAL_COMMUNITY): Payer: Self-pay | Admitting: Hematology

## 2020-07-13 DIAGNOSIS — C9000 Multiple myeloma not having achieved remission: Secondary | ICD-10-CM

## 2020-07-13 NOTE — Telephone Encounter (Signed)
Chart reviewed. Pomalyst refilled per Dr. Katragadda 

## 2020-07-19 ENCOUNTER — Ambulatory Visit (HOSPITAL_COMMUNITY)
Admission: RE | Admit: 2020-07-19 | Discharge: 2020-07-19 | Disposition: A | Discharge status: Home / Self Care | Payer: Medicare Other | Source: Ambulatory Visit | Attending: Hematology | Admitting: Hematology

## 2020-07-19 ENCOUNTER — Other Ambulatory Visit: Payer: Self-pay

## 2020-07-19 DIAGNOSIS — C9000 Multiple myeloma not having achieved remission: Secondary | ICD-10-CM

## 2020-07-19 MED ORDER — GADOBUTROL 1 MMOL/ML IV SOLN
7.0000 mL | Freq: Once | INTRAVENOUS | Status: AC | PRN
  Administered 2020-07-19: 7 mL via INTRAVENOUS

## 2020-07-22 ENCOUNTER — Other Ambulatory Visit (HOSPITAL_COMMUNITY): Payer: Self-pay

## 2020-07-22 DIAGNOSIS — F419 Anxiety disorder, unspecified: Secondary | ICD-10-CM

## 2020-07-22 DIAGNOSIS — C9000 Multiple myeloma not having achieved remission: Secondary | ICD-10-CM

## 2020-07-22 MED ORDER — ALPRAZOLAM 0.5 MG PO TABS: ORAL_TABLET | ORAL | 3 refills | Status: DC

## 2020-07-25 ENCOUNTER — Telehealth (HOSPITAL_COMMUNITY): Payer: Self-pay

## 2020-07-25 ENCOUNTER — Encounter (HOSPITAL_COMMUNITY): Payer: Self-pay

## 2020-07-25 NOTE — Telephone Encounter (Signed)
Patient called this morning stated that she spoke with someone a few days ago requesting a refill on her Xanax prescription.  She states that the pharmacy told her they did not receive a refill request.  This nurse verified that refill request was received by pharmacy.  Advised patient that medication request was received.  Patient had no further questions or requests at this time.

## 2020-07-25 NOTE — Progress Notes (Signed)
Patient aware of recent MRI results. Referral to Rad Onc placed per Dr. Delton Coombes.

## 2020-07-26 ENCOUNTER — Other Ambulatory Visit (HOSPITAL_COMMUNITY): Payer: Self-pay | Admitting: *Deleted

## 2020-07-26 ENCOUNTER — Encounter (HOSPITAL_COMMUNITY): Payer: Self-pay | Admitting: *Deleted

## 2020-07-26 DIAGNOSIS — C9000 Multiple myeloma not having achieved remission: Secondary | ICD-10-CM

## 2020-07-26 NOTE — Progress Notes (Signed)
Orders placed for PET scan per Dr. Delton Coombes.  Patient had positive MR shoulder last week.  She has already been referred to radiation at [facility].

## 2020-08-01 ENCOUNTER — Other Ambulatory Visit (HOSPITAL_COMMUNITY): Payer: Self-pay

## 2020-08-01 NOTE — Progress Notes (Signed)
Histology and Location of Primary Cancer: Multiple Myeloma with mets to right shoulder   Location(s) of Symptomatic Metastases: Right Shoulder.  Patient with a couple months of pain in her right upper humerus and shoulder.  She is taking norco q6 and ibuprofen for breakthrough pain.  MRI Shoulder 07/19/2020: 2.2 cm ill-defined myelomatous lesion in the acromion 6.  Tiny intrasubstance tear of the distal supraspinatus tendon at the insertion. No high-grade rotator cuff tear.  Right Humerus x ray 06/21/2020: Multiple well-circumscribed lucencies and endosteal scalloping of the humerus consistent with the provided history of multiple myeloma. No evidence of pathologic fracture.   Past/Anticipated chemotherapy by medical oncology, if any:  Dr. Katragadda 07/06/2020 -She is tolerating pomalidomide maintenance 2 mg 3 weeks on 1 week off very well.  RTC 2 months with repeat myeloma panel. -I have done x-rays of right humerus on 06/21/2020.  There are myeloma lesions but have improved from pretreatment skeletal survey.  No fractures. -On examination there is also tenderness over the right shoulder joint. -Recommended MRI of the shoulder with and without contrast to further evaluate this.   Pain on a scale of 0-10 is: 7/10 pain in her right shoulder.   Ambulatory status? Walker? Wheelchair?: Ambulatory  SAFETY ISSUES:  Prior radiation? No  Pacemaker/ICD? No  Possible current pregnancy? Hysterectomy  Is the patient on methotrexate? No  Current Complaints / other details:   -Port placed 2019  

## 2020-08-02 ENCOUNTER — Ambulatory Visit
Admission: RE | Admit: 2020-08-02 | Discharge: 2020-08-02 | Disposition: A | Discharge status: Home / Self Care | Payer: Medicare Other | Source: Ambulatory Visit | Attending: Radiation Oncology | Admitting: Radiation Oncology

## 2020-08-02 ENCOUNTER — Encounter: Payer: Self-pay | Admitting: Radiation Oncology

## 2020-08-02 ENCOUNTER — Other Ambulatory Visit: Payer: Self-pay

## 2020-08-02 ENCOUNTER — Encounter (HOSPITAL_COMMUNITY): Payer: Self-pay | Admitting: General Practice

## 2020-08-02 VITALS — BP 156/66 | HR 55 | Resp 20 | Ht 66.0 in | Wt 176.0 lb

## 2020-08-02 DIAGNOSIS — C7951 Secondary malignant neoplasm of bone: Secondary | ICD-10-CM

## 2020-08-02 DIAGNOSIS — Z7984 Long term (current) use of oral hypoglycemic drugs: Secondary | ICD-10-CM | POA: Diagnosis not present

## 2020-08-02 DIAGNOSIS — Z7901 Long term (current) use of anticoagulants: Secondary | ICD-10-CM | POA: Diagnosis not present

## 2020-08-02 DIAGNOSIS — Z800 Family history of malignant neoplasm of digestive organs: Secondary | ICD-10-CM | POA: Insufficient documentation

## 2020-08-02 DIAGNOSIS — C9000 Multiple myeloma not having achieved remission: Secondary | ICD-10-CM

## 2020-08-02 DIAGNOSIS — G893 Neoplasm related pain (acute) (chronic): Secondary | ICD-10-CM | POA: Insufficient documentation

## 2020-08-02 DIAGNOSIS — Z801 Family history of malignant neoplasm of trachea, bronchus and lung: Secondary | ICD-10-CM | POA: Insufficient documentation

## 2020-08-02 DIAGNOSIS — F418 Other specified anxiety disorders: Secondary | ICD-10-CM | POA: Insufficient documentation

## 2020-08-02 DIAGNOSIS — I10 Essential (primary) hypertension: Secondary | ICD-10-CM | POA: Insufficient documentation

## 2020-08-02 DIAGNOSIS — Z7982 Long term (current) use of aspirin: Secondary | ICD-10-CM | POA: Insufficient documentation

## 2020-08-02 DIAGNOSIS — Z79899 Other long term (current) drug therapy: Secondary | ICD-10-CM | POA: Diagnosis not present

## 2020-08-02 MED ORDER — OXYCODONE HCL 5 MG PO TABS: 5.0000 mg | ORAL_TABLET | ORAL | 0 refills | Status: DC | PRN

## 2020-08-02 NOTE — Progress Notes (Signed)
Radiation Oncology         [phone]) [phone] ________________________________  Name: Alexandra Price        MRN: [id number]  Date of Service: 08/02/2020 DOB: October 23, 1945  CC:[facility]  [name], MD     REFERRING PHYSICIAN: [name], MD   DIAGNOSIS: The primary encounter diagnosis was Multiple myeloma without remission ([place]). A diagnosis of Secondary malignant neoplasm of bone [facility]) was also pertinent to this visit.   HISTORY OF PRESENT ILLNESS: Alexandra Price is a 74 y.o. female seen at the request of Dr. Delton Coombes for a history of multiple myeloma, the patient was originally diagnosed in the summer 2019, and received induction chemotherapy followed by stem cell transplant on 10/23/2018 at [facility].  Apparently she had a PET scan in June 2020 that showed innumerable lytic lesions involving the calvarium axial and appendicular skeleton, these were similar to her scan in January 2020, she has had maintenance therapy with Dr. Delton Coombes since and has been receiving polymast.  She has had multiple images in the last few years but no systemic imaging this year.  She has had no new disease in the left clavicle and over the summer had a fracture that repaired itself, so the feeling was that she had not only myelomatous disease but also arthritic changes as well.  She is recently however had pain in her right shoulder, and this is prompted further work-up including plain films on 06/21/2020 showing concern for multiple well-circumscribed lucencies and and does steel scalloping of the humerus consistent with her diagnosis without fracture.  Her symptoms persisted and an MRI of the shoulder with and without contrast on 07/19/2020 showed 2.2 cm ill-defined lesion in the acromion and tiny intrasubstance tear of the distal supraspinatus tendon at the insertion no high-grade rotator cuff tear was identified but she also had mild subacromial and subdeltoid bursitis, in  retrospect, there were scans also in July 2020 one of her sacrum showing diffuse lytic lesions throughout the visualized portion of the appendicular skeleton as well as axial skeleton, she does have a chronic compression fracture deformity at L4, and this was performed because of pain as well in the coccygeal region.  She is seen today to discuss options of palliative radiotherapy to the right acromion/shoulder.    PREVIOUS RADIATION THERAPY: No   PAST MEDICAL HISTORY:  Past Medical History:  Diagnosis Date  . Anxiety   . Depression   . Hypertension   . Multiple myeloma ([place])    multiple myeloma       PAST SURGICAL HISTORY: Past Surgical History:  Procedure Laterality Date  . ABDOMINAL HYSTERECTOMY     total  . APPENDECTOMY    . LAPAROSCOPIC APPENDECTOMY N/A 05/20/2018   Procedure: APPENDECTOMY LAPAROSCOPIC;  Surgeon: [name], MD;  Location: [facility];  Service: General;  Laterality: N/A;  . PORTACATH PLACEMENT Right 04/14/2018   Procedure: INSERTION PORT-A-CATH;  Surgeon: [name], MD;  Location: [facility];  Service: General;  Laterality: Right;     FAMILY HISTORY:  Family History  Problem Relation Age of Onset  . Heart disease Mother   . Emphysema Father   . Diabetes Sister   . Depression Sister   . Cancer Brother        liver, lung, and colon  . Diabetes Brother      SOCIAL HISTORY:  reports that she has never smoked. She has never used smokeless tobacco. She reports previous drug use. She reports that  she does not drink alcohol.   ALLERGIES: Ciprofloxacin, Amoxicillin, Morphine, Morphine and related, and Augmentin [amoxicillin-pot clavulanate]   MEDICATIONS:  Current Outpatient Medications  Medication Sig Dispense Refill  . acyclovir (ZOVIRAX) 400 MG tablet Take 1 tablet (400 mg total) by mouth 2 (two) times daily. 60 tablet 4  . ALPRAZolam (XANAX) 0.5 MG tablet Take one tablet during the day as needed for anxiety and two tablets at bedtime as needed for  anxiety/insomnia 90 tablet 3  . Calcium Carb-Cholecalciferol (CALCIUM 1000 + D PO) Take 1,000 mg by mouth daily. Take one tablet daily until next visit with oncologist.    . citalopram (CELEXA) 40 MG tablet Take 1 tablet (40 mg total) by mouth daily. 90 tablet 1  . ELIQUIS 5 MG TABS tablet Take 5 mg by mouth 2 (two) times daily.    Marland Kitchen glipiZIDE (GLUCOTROL XL) 5 MG 24 hr tablet Take 1 tablet by mouth once daily with breakfast 90 tablet 2  . Multiple Vitamin (THERA) TABS Take 1 tablet by mouth daily. 30 tablet 5  . ondansetron (ZOFRAN) 4 MG tablet Take 1 tablet (4 mg total) by mouth every 8 (eight) hours as needed for nausea or vomiting. 20 tablet 1  . pantoprazole (PROTONIX) 40 MG tablet Take 1 tablet (40 mg total) by mouth daily. 30 tablet 2  . POMALYST 2 MG capsule TAKE 1 CAPSULE BY MOUTH ONCE DAILY FOR 21 DAYS ON AND 7 DAYS OFF 21 capsule 1  . POMALYST 2 MG capsule TAKE 1 CAPSULE BY MOUTH ONCE DAILY FOR 21 DAYS ON AND 7 DAYS OFF 21 capsule 0  . rosuvastatin (CRESTOR) 10 MG tablet Take by mouth.    . terbinafine (LAMISIL) 1 % cream Apply 1 application topically 2 (two) times daily. 30 g 0  . zolpidem (AMBIEN) 10 MG tablet Take 1 tablet (10 mg total) by mouth at bedtime as needed for sleep. 30 tablet 2  . aspirin EC 81 MG tablet Take 81 mg by mouth daily. (Patient not taking: Reported on 08/02/2020)    . oxyCODONE (OXY IR/ROXICODONE) 5 MG immediate release tablet Take 1 tablet (5 mg total) by mouth every 4 (four) hours as needed for severe pain. 30 tablet 0   No current facility-administered medications for this encounter.     REVIEW OF SYSTEMS: On review of systems, the patient reports that she is doing okay overall.  She describes symptoms of pain in the right shoulder that is the primary site of her discomfort, she also reports that this travels down to her mid bicep and even for her elbow on occasion.  She has been taking ibuprofen and alternating this with 7.5 strength Norco.  She denies any  significant relief of her symptoms, she is also still noticing pain in her left shoulder, but states this comes and goes, she is experiencing this significantly this morning in our office, and also describes her sacral/coccygeal pain as being a persistent problem as well, she has had prior back injury and surgery so she has had chronic arthritic pain as well accompanying her myeloma diagnosed.  He denies any significant constipation but is aware of the need to take narcotic pain medicine.  No other complaints are verbalized.     PHYSICAL EXAM:  Wt Readings from Last 3 Encounters:  08/02/20 176 lb (79.8 kg)  07/06/20 173 lb 1.6 oz (78.5 kg)  06/29/20 173 lb 14.4 oz (78.9 kg)   Temp Readings from Last 3 Encounters:  07/06/20 (!) 97.3  F (36.3 C) (Temporal)  06/29/20 98.1 F (36.7 C) (Temporal)   BP Readings from Last 3 Encounters:  08/02/20 (!) 156/66  07/06/20 (!) 155/75  06/29/20 (!) 141/70   Pulse Readings from Last 3 Encounters:  08/02/20 (!) 55  07/06/20 65  06/29/20 (!) 59   Pain Assessment Pain Score: 7  Pain Loc:  (Right Shoulder)/10  In general this is a well appearing caucasian female in no acute distress. She's alert and oriented x4 and appropriate throughout the examination. Cardiopulmonary assessment is negative for acute distress and she exhibits normal effort. Limited range motion is noted with small movements of her upper extremities bilaterally due to encountering pain with these movements.   ECOG = 1  0 - Asymptomatic (Fully active, able to carry on all predisease activities without restriction)  1 - Symptomatic but completely ambulatory (Restricted in physically strenuous activity but ambulatory and able to carry out work of a light or sedentary nature. For example, light housework, office work)  2 - Symptomatic, <50% in bed during the day (Ambulatory and capable of all self care but unable to carry out any work activities. Up and about more than 50% of waking  hours)  3 - Symptomatic, >50% in bed, but not bedbound (Capable of only limited self-care, confined to bed or chair 50% or more of waking hours)  4 - Bedbound (Completely disabled. Cannot carry on any self-care. Totally confined to bed or chair)  5 - Death   Eustace Pen MM, Creech RH, Tormey DC, et al. [id number]). "Toxicity and response criteria of the [facility]". Comfort Oncol. 5 (6): 649-55    LABORATORY DATA:  Lab Results  Component Value Date   WBC 2.8 (L) 06/29/2020   HGB 9.6 (L) 06/29/2020   HCT 29.3 (L) 06/29/2020   MCV 105.8 (H) 06/29/2020   PLT 158 06/29/2020   Lab Results  Component Value Date   NA 137 06/29/2020   K 4.3 06/29/2020   CL 102 06/29/2020   CO2 27 06/29/2020   Lab Results  Component Value Date   ALT 14 06/29/2020   AST 18 06/29/2020   ALKPHOS 50 06/29/2020   BILITOT 0.4 06/29/2020      RADIOGRAPHY: MR SHOULDER RIGHT W WO CONTRAST  Result Date: 07/20/2020 CLINICAL DATA:  Right shoulder pain for the past 2 months. No injury. History of multiple myeloma. EXAM: MRI OF THE RIGHT SHOULDER WITHOUT AND WITH CONTRAST TECHNIQUE: Multiplanar, multisequence MR imaging of the right shoulder was performed before and after the administration of intravenous contrast. CONTRAST:  2m GADAVIST GADOBUTROL 1 MMOL/ML IV SOLN COMPARISON:  Right humerus x-rays dated June 21, 2020. FINDINGS: Rotator cuff: Tiny intrasubstance tear of the distal supraspinatus tendon at the insertion. Rotator cuff is otherwise intact. Muscles: No atrophy or abnormal signal of the muscles of the rotator cuff. Biceps long head:  Intact and normally positioned. Acromioclavicular Joint: Normal acromioclavicular joint. Type II acromion. Small amount of fluid in the subacromial/subdeltoid bursa. Glenohumeral Joint: No joint effusion. Diffuse cartilage thinning without focal defect. Labrum: Grossly intact, but evaluation is limited by lack of intraarticular fluid. Bones: 2.2 cm  ill-defined T2 hyperintense, T1 hypointense, mildly enhancing lesion in the acromion. No other focal bone lesion. Marrow heterogeneity in the proximal humeral metadiaphysis. No acute fracture or dislocation. Other: Partially visualized right chest wall port catheter hub. IMPRESSION: 1. 2.2 cm ill-defined myelomatous lesion in the acromion 6. 2. Tiny intrasubstance tear of the distal supraspinatus tendon at the  insertion. No high-grade rotator cuff tear. 3. Mild subacromial/subdeltoid bursitis. Electronically Signed   By: Titus Dubin M.D.   On: 07/20/2020 08:45       IMPRESSION/PLAN: 1. Multiple Myeloma not having achieved remission. Dr. Lisbeth Renshaw discusses the patient's history and rationale for palliative radiotherapy to the right acromion/shoulder region. Her prior imaging shows concern for disease in the left clavicle, so he would consider treatment of the left shoulder region as well and determine if there should be treatment to the left shoulder based on her simulation imaging. She is aware of the palliative intent, and the possibility of treating other sites, specifically her coccyx where she has symptoms if visible findings on her upcoming PET show activity.  The patient is interested in proceeding. Dr. Lisbeth Renshaw discusses the delivery and logistics of radiotherapy and anticipates a course of 2 weeks of radiotherapy to the right shoulder, and possibly left shoulder. Written consent is obtained and placed in the chart, a copy was provided to the patient. She is also aware of the possibility of needing orthopedic surgery's input given her bursitis and small rotator cuff tear. 2. Pain secondary to #1. The patient is not having good relief with Norco/Ibuprofen. She has tolerated this well though considering she has intolerance to morphine. We discussed the options to switch to Oxycodone, and to let us know if she has symptoms of intolerance such as nausea or vomiting with this course. A new rx was sent into her  pharmacy and we reviewed side effect profile.   In a visit lasting 60 minutes, greater than 50% of the time was spent face to face discussing the patient's condition, in preparation for the discussion, and coordinating the patient's care.   The above documentation reflects my direct findings during this shared patient visit. Please see the separate note by Dr. Lisbeth Renshaw on this date for the remainder of the patient's plan of care.    Carola Rhine, PAC

## 2020-08-02 NOTE — Progress Notes (Signed)
[place] Psychosocial Distress Screening Clinical Social Work  Clinical Social Work was referred by distress screening protocol.  The patient scored a 7 on the Psychosocial Distress Thermometer which indicates moderate distress. Clinical Social Worker contacted patient by phone to assess for distress and other psychosocial needs. Has had stem cell transplant, radiation, chemotherapy this past year.  "Taking things one day at a time."  Drives herself to/from appointments.  "If I have to have somebody, my son will take me."  Plan is for 10 days of radiation to treat current situation.  Still struggles with anxiety, "some days I just want to sit down and cry" but "I just keep going. "  Likes to garden and take care of pets (2 [place], poodle, Fransisco Beau terrier).  Enjoys living w son who is very supportive.  Family will gather around the holidays.  Overall her anxiety has decreased and she has good coping skills including distraction and connecting with others.  No concerns at this time, CSW and patient discussed common feeling and emotions when being diagnosed with cancer, and the importance of support during treatment. CSW informed patient of the support team and support services at [facility] and [facility]. CSW provided contact information and encouraged patient to call with any questions or concerns.  ONCBCN DISTRESS SCREENING 08/02/2020  Screening Type Initial Screening  Distress experienced in past week (1-10) 7  Practical problem type   Family Problem type   Emotional problem type   Spiritual/Religous concerns type   Physical Problem type Pain;Sleep/insomnia;Bathing/dressing  Referral to clinical social work   Wellsite geologist via phone    Clinical Social Worker follow up needed: No.  If yes, follow up plan:  Beverely Pace, [place], LCSW Clinical Social Worker Phone:  [phone]

## 2020-08-04 ENCOUNTER — Other Ambulatory Visit: Payer: Self-pay | Admitting: Urology

## 2020-08-04 ENCOUNTER — Ambulatory Visit
Admission: RE | Admit: 2020-08-04 | Discharge: 2020-08-04 | Disposition: A | Discharge status: Home / Self Care | Payer: Medicare Other | Source: Ambulatory Visit | Attending: Radiation Oncology | Admitting: Radiation Oncology

## 2020-08-04 DIAGNOSIS — Z510 Encounter for antineoplastic radiation therapy: Secondary | ICD-10-CM | POA: Insufficient documentation

## 2020-08-04 DIAGNOSIS — C9000 Multiple myeloma not having achieved remission: Secondary | ICD-10-CM | POA: Diagnosis present

## 2020-08-04 MED ORDER — HYDROCODONE-ACETAMINOPHEN 7.5-325 MG PO TABS: 1.0000 | ORAL_TABLET | Freq: Four times a day (QID) | ORAL | 0 refills | Status: DC | PRN

## 2020-08-08 ENCOUNTER — Ambulatory Visit: Payer: Medicare Other | Admitting: Radiation Oncology

## 2020-08-08 ENCOUNTER — Ambulatory Visit
Admission: RE | Admit: 2020-08-08 | Discharge: 2020-08-08 | Disposition: A | Discharge status: Home / Self Care | Payer: Medicare Other | Source: Ambulatory Visit | Attending: Radiation Oncology | Admitting: Radiation Oncology

## 2020-08-08 ENCOUNTER — Other Ambulatory Visit: Payer: Self-pay

## 2020-08-09 ENCOUNTER — Ambulatory Visit
Admission: RE | Admit: 2020-08-09 | Discharge: 2020-08-09 | Disposition: A | Discharge status: Home / Self Care | Payer: Medicare Other | Source: Ambulatory Visit | Attending: Radiation Oncology | Admitting: Radiation Oncology

## 2020-08-09 DIAGNOSIS — Z510 Encounter for antineoplastic radiation therapy: Secondary | ICD-10-CM | POA: Diagnosis not present

## 2020-08-10 ENCOUNTER — Other Ambulatory Visit: Payer: Self-pay

## 2020-08-10 ENCOUNTER — Ambulatory Visit
Admission: RE | Admit: 2020-08-10 | Discharge: 2020-08-10 | Disposition: A | Discharge status: Home / Self Care | Payer: Medicare Other | Source: Ambulatory Visit | Attending: Radiation Oncology | Admitting: Radiation Oncology

## 2020-08-10 ENCOUNTER — Other Ambulatory Visit (HOSPITAL_COMMUNITY): Payer: Self-pay | Admitting: Hematology

## 2020-08-10 DIAGNOSIS — Z510 Encounter for antineoplastic radiation therapy: Secondary | ICD-10-CM | POA: Diagnosis not present

## 2020-08-10 DIAGNOSIS — C9000 Multiple myeloma not having achieved remission: Secondary | ICD-10-CM

## 2020-08-10 NOTE — Telephone Encounter (Signed)
Chart reviewed. Patient's Pomalyst refilled per Dr. Katragadda. 

## 2020-08-11 ENCOUNTER — Ambulatory Visit
Admission: RE | Admit: 2020-08-11 | Discharge: 2020-08-11 | Disposition: A | Discharge status: Home / Self Care | Payer: Medicare Other | Source: Ambulatory Visit | Attending: Radiation Oncology | Admitting: Radiation Oncology

## 2020-08-11 ENCOUNTER — Other Ambulatory Visit: Payer: Self-pay

## 2020-08-11 DIAGNOSIS — Z510 Encounter for antineoplastic radiation therapy: Secondary | ICD-10-CM | POA: Diagnosis not present

## 2020-08-15 ENCOUNTER — Ambulatory Visit
Admission: RE | Admit: 2020-08-15 | Discharge: 2020-08-15 | Disposition: A | Discharge status: Home / Self Care | Payer: Medicare Other | Source: Ambulatory Visit | Attending: Radiation Oncology | Admitting: Radiation Oncology

## 2020-08-15 DIAGNOSIS — Z510 Encounter for antineoplastic radiation therapy: Secondary | ICD-10-CM | POA: Diagnosis not present

## 2020-08-16 ENCOUNTER — Ambulatory Visit
Admission: RE | Admit: 2020-08-16 | Discharge: 2020-08-16 | Disposition: A | Discharge status: Home / Self Care | Payer: Medicare Other | Source: Ambulatory Visit | Attending: Radiation Oncology | Admitting: Radiation Oncology

## 2020-08-16 DIAGNOSIS — Z510 Encounter for antineoplastic radiation therapy: Secondary | ICD-10-CM | POA: Diagnosis not present

## 2020-08-17 ENCOUNTER — Ambulatory Visit
Admission: RE | Admit: 2020-08-17 | Discharge: 2020-08-17 | Disposition: A | Discharge status: Home / Self Care | Payer: Medicare Other | Source: Ambulatory Visit | Attending: Radiation Oncology | Admitting: Radiation Oncology

## 2020-08-17 DIAGNOSIS — Z510 Encounter for antineoplastic radiation therapy: Secondary | ICD-10-CM | POA: Diagnosis not present

## 2020-08-18 ENCOUNTER — Other Ambulatory Visit: Payer: Self-pay | Admitting: Radiation Oncology

## 2020-08-18 ENCOUNTER — Ambulatory Visit
Admission: RE | Admit: 2020-08-18 | Discharge: 2020-08-18 | Disposition: A | Discharge status: Home / Self Care | Payer: Medicare Other | Source: Ambulatory Visit | Attending: Radiation Oncology | Admitting: Radiation Oncology

## 2020-08-18 DIAGNOSIS — Z510 Encounter for antineoplastic radiation therapy: Secondary | ICD-10-CM | POA: Diagnosis not present

## 2020-08-18 MED ORDER — HYDROCODONE-ACETAMINOPHEN 7.5-325 MG PO TABS: 1.0000 | ORAL_TABLET | Freq: Four times a day (QID) | ORAL | 0 refills | Status: DC | PRN

## 2020-08-22 ENCOUNTER — Other Ambulatory Visit: Payer: Self-pay

## 2020-08-22 ENCOUNTER — Ambulatory Visit: Payer: Medicare Other

## 2020-08-22 ENCOUNTER — Other Ambulatory Visit (HOSPITAL_COMMUNITY): Payer: Self-pay

## 2020-08-22 ENCOUNTER — Encounter (HOSPITAL_COMMUNITY)
Admission: RE | Admit: 2020-08-22 | Discharge: 2020-08-22 | Disposition: A | Discharge status: Home / Self Care | Payer: Medicare Other | Source: Ambulatory Visit | Attending: Hematology | Admitting: Hematology

## 2020-08-22 DIAGNOSIS — C9000 Multiple myeloma not having achieved remission: Secondary | ICD-10-CM

## 2020-08-22 DIAGNOSIS — F419 Anxiety disorder, unspecified: Secondary | ICD-10-CM

## 2020-08-22 DIAGNOSIS — R42 Dizziness and giddiness: Secondary | ICD-10-CM

## 2020-08-22 DIAGNOSIS — G479 Sleep disorder, unspecified: Secondary | ICD-10-CM

## 2020-08-22 MED ORDER — PANTOPRAZOLE SODIUM 40 MG PO TBEC: 40.0000 mg | DELAYED_RELEASE_TABLET | Freq: Every day | ORAL | 2 refills | Status: DC

## 2020-08-22 MED ORDER — ALPRAZOLAM 0.5 MG PO TABS: ORAL_TABLET | ORAL | 3 refills | Status: DC

## 2020-08-22 MED ORDER — ZOLPIDEM TARTRATE 10 MG PO TABS: 10.0000 mg | ORAL_TABLET | Freq: Every evening | ORAL | 2 refills | Status: DC | PRN

## 2020-08-23 ENCOUNTER — Other Ambulatory Visit (HOSPITAL_COMMUNITY): Payer: Self-pay | Admitting: Hematology

## 2020-08-23 ENCOUNTER — Ambulatory Visit: Payer: Medicare Other

## 2020-08-23 ENCOUNTER — Ambulatory Visit
Admission: RE | Admit: 2020-08-23 | Discharge: 2020-08-23 | Disposition: A | Discharge status: Home / Self Care | Payer: Medicare Other | Source: Ambulatory Visit | Attending: Radiation Oncology | Admitting: Radiation Oncology

## 2020-08-23 DIAGNOSIS — C9000 Multiple myeloma not having achieved remission: Secondary | ICD-10-CM | POA: Diagnosis not present

## 2020-08-23 DIAGNOSIS — R42 Dizziness and giddiness: Secondary | ICD-10-CM

## 2020-08-23 MED ORDER — FLUDEOXYGLUCOSE F - 18 (FDG) INJECTION
9.6800 | Freq: Once | INTRAVENOUS | Status: AC | PRN
  Administered 2020-08-22: 9.68 via INTRAVENOUS

## 2020-08-24 ENCOUNTER — Ambulatory Visit
Admission: RE | Admit: 2020-08-24 | Discharge: 2020-08-24 | Disposition: A | Discharge status: Home / Self Care | Payer: Medicare Other | Source: Ambulatory Visit | Attending: Radiation Oncology | Admitting: Radiation Oncology

## 2020-08-24 ENCOUNTER — Other Ambulatory Visit: Payer: Self-pay

## 2020-08-24 ENCOUNTER — Ambulatory Visit: Payer: Medicare Other

## 2020-08-24 DIAGNOSIS — C9000 Multiple myeloma not having achieved remission: Secondary | ICD-10-CM | POA: Diagnosis not present

## 2020-08-25 ENCOUNTER — Ambulatory Visit
Admission: RE | Admit: 2020-08-25 | Discharge: 2020-08-25 | Disposition: A | Discharge status: Home / Self Care | Payer: Medicare Other | Source: Ambulatory Visit | Attending: Radiation Oncology | Admitting: Radiation Oncology

## 2020-08-25 ENCOUNTER — Encounter: Payer: Self-pay | Admitting: Radiation Oncology

## 2020-08-25 ENCOUNTER — Other Ambulatory Visit: Payer: Self-pay

## 2020-08-25 DIAGNOSIS — C9000 Multiple myeloma not having achieved remission: Secondary | ICD-10-CM | POA: Diagnosis not present

## 2020-08-30 ENCOUNTER — Other Ambulatory Visit (HOSPITAL_COMMUNITY): Payer: Self-pay

## 2020-08-30 ENCOUNTER — Other Ambulatory Visit (HOSPITAL_COMMUNITY): Payer: Self-pay | Admitting: Hematology

## 2020-08-30 MED ORDER — HYDROCODONE-ACETAMINOPHEN 7.5-325 MG PO TABS: 1.0000 | ORAL_TABLET | Freq: Four times a day (QID) | ORAL | 0 refills | Status: DC | PRN

## 2020-08-31 ENCOUNTER — Other Ambulatory Visit (HOSPITAL_COMMUNITY): Payer: Self-pay

## 2020-08-31 MED ORDER — HYDROCODONE-ACETAMINOPHEN 7.5-325 MG PO TABS: 1.0000 | ORAL_TABLET | Freq: Four times a day (QID) | ORAL | 0 refills | Status: DC | PRN

## 2020-09-01 ENCOUNTER — Other Ambulatory Visit: Payer: Self-pay

## 2020-09-01 ENCOUNTER — Inpatient Hospital Stay (HOSPITAL_COMMUNITY): Payer: Medicare Other | Attending: Hematology

## 2020-09-01 DIAGNOSIS — N189 Chronic kidney disease, unspecified: Secondary | ICD-10-CM | POA: Diagnosis not present

## 2020-09-01 DIAGNOSIS — I129 Hypertensive chronic kidney disease with stage 1 through stage 4 chronic kidney disease, or unspecified chronic kidney disease: Secondary | ICD-10-CM | POA: Insufficient documentation

## 2020-09-01 DIAGNOSIS — F419 Anxiety disorder, unspecified: Secondary | ICD-10-CM | POA: Insufficient documentation

## 2020-09-01 DIAGNOSIS — D631 Anemia in chronic kidney disease: Secondary | ICD-10-CM | POA: Diagnosis not present

## 2020-09-01 DIAGNOSIS — Z452 Encounter for adjustment and management of vascular access device: Secondary | ICD-10-CM | POA: Diagnosis not present

## 2020-09-01 DIAGNOSIS — Z7901 Long term (current) use of anticoagulants: Secondary | ICD-10-CM | POA: Insufficient documentation

## 2020-09-01 DIAGNOSIS — C9000 Multiple myeloma not having achieved remission: Secondary | ICD-10-CM | POA: Diagnosis present

## 2020-09-01 DIAGNOSIS — I2699 Other pulmonary embolism without acute cor pulmonale: Secondary | ICD-10-CM | POA: Insufficient documentation

## 2020-09-01 LAB — CBC WITH DIFFERENTIAL/PLATELET
Abs Immature Granulocytes: 0 10*3/uL (ref 0.00–0.07)
Basophils Absolute: 0.1 10*3/uL (ref 0.0–0.1)
Basophils Relative: 2 %
Eosinophils Absolute: 0.2 10*3/uL (ref 0.0–0.5)
Eosinophils Relative: 7 %
HCT: 28.9 % — ABNORMAL LOW (ref 36.0–46.0)
Hemoglobin: 9.4 g/dL — ABNORMAL LOW (ref 12.0–15.0)
Immature Granulocytes: 0 %
Lymphocytes Relative: 27 %
Lymphs Abs: 0.8 10*3/uL (ref 0.7–4.0)
MCH: 34.6 pg — ABNORMAL HIGH (ref 26.0–34.0)
MCHC: 32.5 g/dL (ref 30.0–36.0)
MCV: 106.3 fL — ABNORMAL HIGH (ref 80.0–100.0)
Monocytes Absolute: 0.3 10*3/uL (ref 0.1–1.0)
Monocytes Relative: 12 %
Neutro Abs: 1.5 10*3/uL — ABNORMAL LOW (ref 1.7–7.7)
Neutrophils Relative %: 52 %
Platelets: 123 10*3/uL — ABNORMAL LOW (ref 150–400)
RBC: 2.72 MIL/uL — ABNORMAL LOW (ref 3.87–5.11)
RDW: 12.7 % (ref 11.5–15.5)
WBC: 2.8 10*3/uL — ABNORMAL LOW (ref 4.0–10.5)
nRBC: 0 % (ref 0.0–0.2)

## 2020-09-01 LAB — LACTATE DEHYDROGENASE: LDH: 129 U/L (ref 98–192)

## 2020-09-01 LAB — COMPREHENSIVE METABOLIC PANEL
ALT: 16 U/L (ref 0–44)
AST: 19 U/L (ref 15–41)
Albumin: 4.1 g/dL (ref 3.5–5.0)
Alkaline Phosphatase: 46 U/L (ref 38–126)
Anion gap: 8 (ref 5–15)
BUN: 27 mg/dL — ABNORMAL HIGH (ref 8–23)
CO2: 26 mmol/L (ref 22–32)
Calcium: 8.7 mg/dL — ABNORMAL LOW (ref 8.9–10.3)
Chloride: 103 mmol/L (ref 98–111)
Creatinine, Ser: 1.91 mg/dL — ABNORMAL HIGH (ref 0.44–1.00)
GFR, Estimated: 28 mL/min — ABNORMAL LOW (ref >60)
Glucose, Bld: 98 mg/dL (ref 70–99)
Potassium: 4.2 mmol/L (ref 3.5–5.1)
Sodium: 137 mmol/L (ref 135–145)
Total Bilirubin: 0.4 mg/dL (ref 0.3–1.2)
Total Protein: 6.5 g/dL (ref 6.5–8.1)

## 2020-09-01 MED ORDER — HEPARIN SOD (PORK) LOCK FLUSH 100 UNIT/ML IV SOLN
500.0000 [IU] | Freq: Once | INTRAVENOUS | Status: AC
  Administered 2020-09-01: 500 [IU] via INTRAVENOUS

## 2020-09-01 MED ORDER — SODIUM CHLORIDE 0.9% FLUSH
10.0000 mL | Freq: Once | INTRAVENOUS | Status: AC
  Administered 2020-09-01: 10 mL via INTRAVENOUS

## 2020-09-01 NOTE — Progress Notes (Signed)
Alexandra Price presented for Portacath access and flush.  Portacath located right chest wall accessed with  H 20 needle.  Good blood return present. Portacath flushed with 8ml NS and 500U/44ml Heparin and needle removed intact.  Procedure tolerated well and without incident.  Discharged in stable condition ambulatory.

## 2020-09-01 NOTE — Patient Instructions (Signed)
[facility] Discharge Instructions for Patients Receiving Chemotherapy  Today you received the following chemotherapy agents   To help prevent nausea and vomiting after your treatment, we encourage you to take your nausea medication   If you develop nausea and vomiting that is not controlled by your nausea medication, call the clinic.   BELOW ARE SYMPTOMS THAT SHOULD BE REPORTED IMMEDIATELY:  *FEVER GREATER THAN 100.5 F  *CHILLS WITH OR WITHOUT FEVER  NAUSEA AND VOMITING THAT IS NOT CONTROLLED WITH YOUR NAUSEA MEDICATION  *UNUSUAL SHORTNESS OF BREATH  *UNUSUAL BRUISING OR BLEEDING  TENDERNESS IN MOUTH AND THROAT WITH OR WITHOUT PRESENCE OF ULCERS  *URINARY PROBLEMS  *BOWEL PROBLEMS  UNUSUAL RASH Items with * indicate a potential emergency and should be followed up as soon as possible.  Feel free to call the clinic should you have any questions or concerns. The clinic phone number is [phone].  Please show the CHEMO ALERT CARD at check-in to the Emergency Department and triage nurse.   

## 2020-09-02 LAB — KAPPA/LAMBDA LIGHT CHAINS
Kappa free light chain: 29.2 mg/L — ABNORMAL HIGH (ref 3.3–19.4)
Kappa, lambda light chain ratio: 1.26 (ref 0.26–1.65)
Lambda free light chains: 23.1 mg/L (ref 5.7–26.3)

## 2020-09-02 LAB — PROTEIN ELECTROPHORESIS, SERUM
A/G Ratio: 1.7 (ref 0.7–1.7)
Albumin ELP: 4 g/dL (ref 2.9–4.4)
Alpha-1-Globulin: 0.2 g/dL (ref 0.0–0.4)
Alpha-2-Globulin: 0.7 g/dL (ref 0.4–1.0)
Beta Globulin: 0.7 g/dL (ref 0.7–1.3)
Gamma Globulin: 0.7 g/dL (ref 0.4–1.8)
Globulin, Total: 2.3 g/dL (ref 2.2–3.9)
Total Protein ELP: 6.3 g/dL (ref 6.0–8.5)

## 2020-09-02 MED ORDER — OCTREOTIDE ACETATE 20 MG IM KIT
PACK | INTRAMUSCULAR | Status: AC
  Filled 2020-09-02: qty 1

## 2020-09-03 LAB — IMMUNOFIXATION ELECTROPHORESIS
IgA: 69 mg/dL (ref 64–422)
IgG (Immunoglobin G), Serum: 847 mg/dL (ref 586–1602)
IgM (Immunoglobulin M), Srm: 28 mg/dL (ref 26–217)
Total Protein ELP: 6.4 g/dL (ref 6.0–8.5)

## 2020-09-05 ENCOUNTER — Other Ambulatory Visit (HOSPITAL_COMMUNITY): Payer: Medicare Other

## 2020-09-06 ENCOUNTER — Other Ambulatory Visit (HOSPITAL_COMMUNITY): Payer: Self-pay | Admitting: *Deleted

## 2020-09-07 ENCOUNTER — Other Ambulatory Visit (HOSPITAL_COMMUNITY): Payer: Self-pay

## 2020-09-07 ENCOUNTER — Other Ambulatory Visit (HOSPITAL_COMMUNITY): Payer: Self-pay | Admitting: Hematology

## 2020-09-07 DIAGNOSIS — C9000 Multiple myeloma not having achieved remission: Secondary | ICD-10-CM

## 2020-09-07 NOTE — Telephone Encounter (Signed)
Chart reviewed. Pomalyst refilled per Dr. Katragadda 

## 2020-09-08 ENCOUNTER — Other Ambulatory Visit (HOSPITAL_COMMUNITY): Payer: Self-pay

## 2020-09-08 DIAGNOSIS — C9000 Multiple myeloma not having achieved remission: Secondary | ICD-10-CM

## 2020-09-08 MED ORDER — ACYCLOVIR 400 MG PO TABS: 400.0000 mg | ORAL_TABLET | Freq: Two times a day (BID) | ORAL | 4 refills | Status: DC

## 2020-09-12 ENCOUNTER — Encounter (HOSPITAL_COMMUNITY): Payer: Self-pay | Admitting: Hematology

## 2020-09-12 ENCOUNTER — Other Ambulatory Visit: Payer: Self-pay

## 2020-09-12 ENCOUNTER — Inpatient Hospital Stay (HOSPITAL_COMMUNITY): Payer: Medicare Other

## 2020-09-12 ENCOUNTER — Inpatient Hospital Stay (HOSPITAL_BASED_OUTPATIENT_CLINIC_OR_DEPARTMENT_OTHER): Payer: Medicare Other | Admitting: Hematology

## 2020-09-12 VITALS — BP 148/69 | HR 62 | Temp 97.2°F | Resp 18 | Wt 173.7 lb

## 2020-09-12 DIAGNOSIS — C9000 Multiple myeloma not having achieved remission: Secondary | ICD-10-CM | POA: Diagnosis not present

## 2020-09-12 DIAGNOSIS — C9001 Multiple myeloma in remission: Secondary | ICD-10-CM

## 2020-09-12 LAB — IRON AND TIBC
Iron: 58 ug/dL (ref 28–170)
Saturation Ratios: 20 % (ref 10.4–31.8)
TIBC: 290 ug/dL (ref 250–450)
UIBC: 232 ug/dL

## 2020-09-12 LAB — VITAMIN B12: Vitamin B-12: 518 pg/mL (ref 180–914)

## 2020-09-12 LAB — FERRITIN: Ferritin: 576 ng/mL — ABNORMAL HIGH (ref 11–307)

## 2020-09-12 LAB — FOLATE: Folate: 58.2 ng/mL (ref >5.9)

## 2020-09-12 NOTE — Progress Notes (Signed)
Pt is taking Pomalyst as prescribed with no side effects.

## 2020-09-12 NOTE — Progress Notes (Addendum)
Alexandra Price, [place] [address]   CLINIC:  Medical Oncology/Hematology  PCP:  [facility] / [place] [address]  [phone]  REASON FOR VISIT:  Follow-up for multiple myeloma  PRIOR THERAPY:  1. KPD x 4 cycles from 04/17/2018 to 08/21/2018. 2. Stem cell transplant on 10/23/2018. 3. Left and right humerus 25 Gy in 10 fractions from 08/23/2020 to 08/25/2020.  CURRENT THERAPY: Maintenance Pomalyst 3 weeks on, 1 week off  INTERVAL HISTORY:  [name], a 75 y.o. female, returns for routine follow-up for her multiple myeloma. Shamya was last seen on 07/06/2020.  Today she reports feeling okay. She finished radiation on 1/06 and reports that her shoulders have improved, though her right shoulder continues hurting more and weaker than the left shoulder. She continues taking Pomalyst 2 mg for 3 weeks and 1 week off and is tolerating it well. She denies having any recent infections or skin rashes. She is tolerating Eliquis well and reports having easy bruising on her legs.   REVIEW OF SYSTEMS:  Review of Systems  Constitutional: Positive for appetite change (50%) and fatigue (25%).  Cardiovascular: Positive for leg swelling.  Gastrointestinal: Positive for blood in stool (occasional) and nausea.  Musculoskeletal: Positive for back pain (5/10 lower back pain).  Skin: Negative for rash.  Neurological: Positive for dizziness and headaches.  Hematological: Bruises/bleeds easily (easy bruising on legs).  Psychiatric/Behavioral: Positive for depression and sleep disturbance. The patient is nervous/anxious.   All other systems reviewed and are negative.   PAST MEDICAL/SURGICAL HISTORY:  Past Medical History:  Diagnosis Date  . Anxiety   . Depression   . Hypertension   . Multiple myeloma ([place])    multiple myeloma   Past Surgical History:  Procedure Laterality Date  . ABDOMINAL HYSTERECTOMY     total   . APPENDECTOMY    . LAPAROSCOPIC APPENDECTOMY N/A 05/20/2018   Procedure: APPENDECTOMY LAPAROSCOPIC;  Surgeon: [name], MD;  Location: [facility];  Service: General;  Laterality: N/A;  . PORTACATH PLACEMENT Right 04/14/2018   Procedure: INSERTION PORT-A-CATH;  Surgeon: [name], MD;  Location: [facility];  Service: General;  Laterality: Right;    SOCIAL HISTORY:  Social History   Socioeconomic History  . Marital status: Legally Separated    Spouse name: Not on file  . Number of children: 6  . Years of education: Not on file  . Highest education level: Not on file  Occupational History    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  Tobacco Use  . Smoking status: Never Smoker  . Smokeless tobacco: Never Used  Vaping Use  . Vaping Use: Never used  Substance and Sexual Activity  . Alcohol use: Never  . Drug use: Not Currently  . Sexual activity: Not Currently  Other Topics Concern  . Not on file  Social History Narrative  . Not on file   Social Determinants of Health   Financial Resource Strain: Low Risk   . Difficulty of Paying Living Expenses: Not hard at all  Food Insecurity: No Food Insecurity  . Worried About Charity fundraiser in the Last Year: Never true  . Ran Out of Food in the Last Year: Never true  Transportation Needs: No Transportation Needs  . Lack of Transportation (Medical): No  . Lack of Transportation (Non-Medical): No  Physical Activity: Inactive  . Days of Exercise per Week: 0 days  . Minutes of Exercise per Session:  0 min  Stress: No Stress Concern Present  . Feeling of Stress : Not at all  Social Connections: Moderately Isolated  . Frequency of Communication with Friends and Family: More than three times a week  . Frequency of Social Gatherings with Friends and Family: Twice a week  . Attends Religious Services: 1 to 4 times per year  . Active Member of Clubs or Organizations: No  . Attends Archivist Meetings: Never  . Marital Status:  Separated  Intimate Partner Violence: Not At Risk  . Fear of Current or Ex-Partner: No  . Emotionally Abused: No  . Physically Abused: No  . Sexually Abused: No    FAMILY HISTORY:  Family History  Problem Relation Age of Onset  . Heart disease Mother   . Emphysema Father   . Diabetes Sister   . Depression Sister   . Cancer Brother        liver, lung, and colon  . Diabetes Brother     CURRENT MEDICATIONS:  Current Outpatient Medications  Medication Sig Dispense Refill  . acyclovir (ZOVIRAX) 400 MG tablet Take 1 tablet (400 mg total) by mouth 2 (two) times daily. 60 tablet 4  . ALPRAZolam (XANAX) 0.5 MG tablet Take one tablet during the day as needed for anxiety and two tablets at bedtime as needed for anxiety/insomnia 90 tablet 3  . Calcium Carb-Cholecalciferol (CALCIUM 1000 + D PO) Take 1,000 mg by mouth daily. Take one tablet daily until next visit with oncologist.    . citalopram (CELEXA) 40 MG tablet Take 1 tablet (40 mg total) by mouth daily. 90 tablet 1  . ELIQUIS 5 MG TABS tablet Take 5 mg by mouth 2 (two) times daily.    Marland Kitchen glipiZIDE (GLUCOTROL XL) 5 MG 24 hr tablet Take 1 tablet by mouth once daily with breakfast 90 tablet 2  . HYDROcodone-acetaminophen (NORCO) 7.5-325 MG tablet Take 1-2 tablets by mouth every 6 (six) hours as needed for moderate pain. 120 tablet 0  . Multiple Vitamin (THERA) TABS Take 1 tablet by mouth daily. 30 tablet 5  . ondansetron (ZOFRAN) 4 MG tablet Take 1 tablet (4 mg total) by mouth every 8 (eight) hours as needed for nausea or vomiting. 20 tablet 1  . pantoprazole (PROTONIX) 40 MG tablet Take 1 tablet by mouth once daily 90 tablet 0  . POMALYST 2 MG capsule TAKE 1 CAPSULE BY MOUTH ONCE DAILY FOR 21 DAYS ON AND 7 DAYS OFF 21 capsule 1  . rosuvastatin (CRESTOR) 10 MG tablet Take by mouth.    . terbinafine (LAMISIL) 1 % cream Apply 1 application topically 2 (two) times daily. 30 g 0  . zolpidem (AMBIEN) 10 MG tablet Take 1 tablet (10 mg total) by  mouth at bedtime as needed for sleep. 30 tablet 2   No current facility-administered medications for this visit.    ALLERGIES:  Allergies  Allergen Reactions  . Ciprofloxacin Anaphylaxis  . Amoxicillin   . Morphine   . Morphine And Related Nausea And Vomiting  . Augmentin [Amoxicillin-Pot Clavulanate] Other (See Comments)    Headache, insomnia    PHYSICAL EXAM:  Performance status (ECOG): 1 - Symptomatic but completely ambulatory  Vitals:   09/12/20 1410  BP: (!) 148/69  Pulse: 62  Resp: 18  Temp: (!) 97.2 F (36.2 C)  SpO2: 99%   Wt Readings from Last 3 Encounters:  09/12/20 173 lb 11.2 oz (78.8 kg)  08/02/20 176 lb (79.8 kg)  07/06/20 173 lb  1.6 oz (78.5 kg)   Physical Exam  LABORATORY DATA:  I have reviewed the labs as listed.  CBC Latest Ref Rng & Units 09/01/2020 06/29/2020 05/02/2020  WBC 4.0 - 10.5 K/uL 2.8(L) 2.8(L) 2.8(L)  Hemoglobin 12.0 - 15.0 g/dL 9.4(L) 9.6(L) 10.0(L)  Hematocrit 36.0 - 46.0 % 28.9(L) 29.3(L) 31.4(L)  Platelets 150 - 400 K/uL 123(L) 158 163   CMP Latest Ref Rng & Units 09/01/2020 06/29/2020 05/02/2020  Glucose 70 - 99 mg/dL 98 125(H) 178(H)  BUN 8 - 23 mg/dL 27(H) 29(H) 40(H)  Creatinine 0.44 - 1.00 mg/dL 1.91(H) 1.74(H) 1.89(H)  Sodium 135 - 145 mmol/L 137 137 138  Potassium 3.5 - 5.1 mmol/L 4.2 4.3 4.7  Chloride 98 - 111 mmol/L 103 102 101  CO2 22 - 32 mmol/L 26 27 27   Calcium 8.9 - 10.3 mg/dL 8.7(L) 8.9 8.8(L)  Total Protein 6.5 - 8.1 g/dL 6.5 6.7 6.5  Total Bilirubin 0.3 - 1.2 mg/dL 0.4 0.4 0.7  Alkaline Phos 38 - 126 U/L 46 50 45  AST 15 - 41 U/L 19 18 16   ALT 0 - 44 U/L 16 14 14       Component Value Date/Time   RBC 2.72 (L) 09/01/2020 1507   MCV 106.3 (H) 09/01/2020 1507   MCH 34.6 (H) 09/01/2020 1507   MCHC 32.5 09/01/2020 1507   RDW 12.7 09/01/2020 1507   LYMPHSABS 0.8 09/01/2020 1507   MONOABS 0.3 09/01/2020 1507   EOSABS 0.2 09/01/2020 1507   BASOSABS 0.1 09/01/2020 1507   Lab Results  Component Value Date    LDH 129 09/01/2020   LDH 125 06/29/2020   LDH 127 05/02/2020   Lab Results  Component Value Date   TOTALPROTELP 6.3 09/01/2020   TOTALPROTELP 6.4 09/01/2020   ALBUMINELP 4.0 09/01/2020   A1GS 0.2 09/01/2020   A2GS 0.7 09/01/2020   BETS 0.7 09/01/2020   GAMS 0.7 09/01/2020   MSPIKE Not Observed 09/01/2020   SPEI Comment 09/01/2020    Lab Results  Component Value Date   KPAFRELGTCHN 29.2 (H) 09/01/2020   LAMBDASER 23.1 09/01/2020   KAPLAMBRATIO 1.26 09/01/2020   Lab Results  Component Value Date   IGGSERUM 847 09/01/2020   IGGSERUM 776 05/02/2020   IGGSERUM 907 03/08/2020   IGA 69 09/01/2020   IGA 71 05/02/2020   IGA 80 03/08/2020   IGMSERUM 28 09/01/2020   IGMSERUM 19 (L) 05/02/2020   IGMSERUM 18 (L) 03/08/2020     DIAGNOSTIC IMAGING:  I have independently reviewed the scans and discussed with the patient. NM PET Image Restag (PS) Skull Base To Thigh  Result Date: 08/23/2020 CLINICAL DATA:  Subsequent treatment strategy for multiple myeloma, restaging evaluation following history of bone marrow transplant in this 75 year old female. EXAM: NUCLEAR MEDICINE PET SKULL BASE TO THIGH TECHNIQUE: 9.68 mCi F-18 FDG was injected intravenously. Full-ring PET imaging was performed from the skull base to thigh after the radiotracer. CT data was obtained and used for attenuation correction and anatomic localization. Fasting blood glucose: 83 mg/dl COMPARISON:  None. CT of the abdomen and pelvis from March of 2020 and multiple prior CT images dating back to 2019. FINDINGS: Mediastinal blood pool activity: SUV max 3.25 Liver activity: SUV max 3.86 NECK: No hypermetabolic lymph nodes in the neck. Incidental CT findings: none CHEST: No hypermetabolic mediastinal or hilar nodes. No suspicious pulmonary nodules on the CT scan. Incidental CT findings: Normal heart size. Scattered atheromatous plaque in the nonaneurysmal thoracic aorta. RIGHT-sided Port-A-Cath terminates at the caval  to atrial  junction. No adenopathy by size criteria. Basilar atelectasis. No sign of effusion. No consolidative changes. Airways are patent. ABDOMEN/PELVIS: No abnormal hypermetabolic activity within the liver, pancreas, adrenal glands, or spleen. No hypermetabolic lymph nodes in the abdomen or pelvis. Incidental CT findings: Liver, gallbladder, spleen, pancreas, adrenal glands and kidneys without acute process. Sludge in the gallbladder. No acute gastrointestinal process. Aortic atherosclerosis, mild in the abdominal aorta. Post hysterectomy. SKELETON: Uptake about the RIGHT glenohumeral joint beyond the area of concern in the a chromium. Overlying the acromion there is uptake with a maximum SUV of 3.6. About the degenerative change in the joint and with asymmetric uptake surrounding the joint with a maximum SUV of 4.7, of uncertain significance in the setting of recent radiotherapy Expansile lesion with sclerotic margins in the distal LEFT clavicle (image 53, series 3) findings are similar to prior radiographs dating back to April of 2020 with a maximum SUV of 2.7. No focal uptake elsewhere beyond mediastinal blood pool. Multifocal lytic lesions showing a similar appearance to prior imaging throughout the axial and appendicular skeleton. Osteopenia. Spinal degenerative changes. Incidental CT findings: Similar appearance of compression fracture at L4 and a T11. IMPRESSION: Mild uptake, near mediastinal blood pool about the RIGHT acromion with more diffuse uptake about the RIGHT shoulder slightly greater than mediastinal blood pool, of uncertain significance given recent radiotherapy. Attention on follow-up Stigmata of myeloma as on numerous prior exams with numerous lytic foci and signs of previous pathologic fracture. No areas of hypermetabolic activity to suggest additional myomatous involvement Aortic atherosclerosis. Aortic Atherosclerosis (ICD10-I70.0). Electronically Signed   By: Zetta Bills M.D.   On: 08/23/2020  11:38     ASSESSMENT:  1. IgG lambda plasma cell myeloma, stage II, standard risk: -4 cycles of KPD from 04/17/2018 through 08/21/2018, stem cell transplant on 10/23/2018. -PET scan on 01/21/2019 showed multiple bone lesions but without any hypermetabolic activity. -BMBX on 01/21/2019 with normocellular marrow with no increase in plasma cells. Normal FISH. MRD results negative. -Maintenance pomalidomide 2 mg 3 weeks on/1 week off started on 03/10/2019. -Bone marrow biopsy on 10/29/2019 shows trilineage hematopoiesis with no evidence of plasma cells. Chromosome analysis and FISH are normal.  2.  Pulmonary embolism: -CT angiogram on 04/21/2020 showed filling defect at [facility]. -VQ scan confirmed pulmonary embolism. -She is on Eliquis.   PLAN:  1. IgG lambda plasma cell myeloma: -Continue pomalidomide maintenance 2 mg 3 weeks on/1 week off. -Labs on 09/01/2020 shows M spike negative. Kappa light chains 29.2 and ratio of 1.26. Immunofixation is negative. -Creatinine is elevated at 1.91 and stable. Calcium is normal. -We reviewed PET scan results which did not show any evidence of new myeloma. -Continue pomalidomide. RTC 2 months with repeat labs.  2. Back pain: -Continue hydrocodone 7.5 mg every 6 hours as needed.  3. Myeloma bone disease: -Denosumab on hold due to exposure to the bone in the right lower jaw and molar area. Continue calcium and vitamin D.  4. Anxiety: -Continue Xanax twice daily as needed.  5. Sleeping difficulty: -Continue Ambien 10 mg as needed.  6. Normocytic anemia: -Combination anemia from CKD, myelosuppression and pomalidomide. -We will check ferritin, iron panel, B01 and folic acid. LDH was normal.  7.Pulmonary embolism: -Continue Eliquis. No bleeding issues noted.  8.  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 -She underwent radiation therapy to both shoulders. -Pain has improved to  tolerable level.  Orders placed this encounter:  Orders Placed This Encounter  Procedures  . Iron and TIBC  . Vitamin B12  . Ferritin  . Folate     [name], MD [place] [phone]   I, Milinda Antis, am acting as a scribe for Dr. Sanda Linger.  I, [name] MD, have reviewed the above documentation for accuracy and completeness, and I agree with the above.

## 2020-09-12 NOTE — Patient Instructions (Signed)
[place] at [facility] Discharge Instructions  You were seen today by Dr. Delton Coombes. He went over your recent results. You had labs drawn today to determine if you need an iron infusion. Dr. Delton Coombes will see you back in 2 months for labs and follow up.   Thank you for choosing [place] at [facility] to provide your oncology and hematology care.  To afford each patient quality time with our provider, please arrive at least 15 minutes before your scheduled appointment time.   If you have a lab appointment with the [facility] please come in thru the Main Entrance and check in at the main information desk  You need to re-schedule your appointment should you arrive 10 or more minutes late.  We strive to give you quality time with our providers, and arriving late affects you and other patients whose appointments are after yours.  Also, if you no show three or more times for appointments you may be dismissed from the clinic at the providers discretion.     Again, thank you for choosing [facility].  Our hope is that these requests will decrease the amount of time that you wait before being seen by our physicians.       _____________________________________________________________  Should you have questions after your visit to [facility], please contact our office at [phone] between the hours of 8:00 a.m. and 4:30 p.m.  Voicemails left after 4:00 p.m. will not be returned until the following business day.  For prescription refill requests, have your pharmacy contact our office and allow 72 hours.    Cancer Center Support Programs:   > Cancer Support Group  2nd Tuesday of the month 1pm-2pm, Journey Room

## 2020-09-13 MED ORDER — FAMOTIDINE 20 MG PO TABS
ORAL_TABLET | ORAL | Status: AC
  Filled 2020-09-13: qty 1

## 2020-09-13 MED ORDER — ACETAMINOPHEN 325 MG PO TABS
ORAL_TABLET | ORAL | Status: AC
  Filled 2020-09-13: qty 2

## 2020-09-13 MED ORDER — LORATADINE 10 MG PO TABS
ORAL_TABLET | ORAL | Status: AC
  Filled 2020-09-13: qty 1

## 2020-09-15 NOTE — Progress Notes (Signed)
  Radiation Oncology         ([phone]) [phone] ________________________________  Name: Alexandra Price MRN: [id number]  Date: 08/25/2020  DOB: September 24, 1945  End of Treatment Note  Diagnosis:   multiple myeloma      Indication for treatment::  palliative       Radiation treatment dates:   08/09/20 - 08/25/20  Site/dose:    1.  The right shoulder was treated to a dose of 25 Gray in 10 fractions using a 2 field isodose plan. 2.  The left shoulder was treated to a dose of 25 Gray in 10 fractions using a 2 field isodose plan  Narrative: The patient tolerated radiation treatment relatively well.     Plan: The patient has completed radiation treatment. The patient will return to radiation oncology clinic for routine followup in one month. I advised the patient to call or return sooner if they have any questions or concerns related to their recovery or treatment. ________________________________  Jodelle Gross, M.D., Ph.D.

## 2020-09-19 ENCOUNTER — Other Ambulatory Visit: Payer: Self-pay

## 2020-09-19 ENCOUNTER — Ambulatory Visit
Admission: RE | Admit: 2020-09-19 | Discharge: 2020-09-19 | Disposition: A | Discharge status: Home / Self Care | Payer: Medicare Other | Source: Ambulatory Visit | Attending: Radiation Oncology | Admitting: Radiation Oncology

## 2020-09-19 DIAGNOSIS — C9000 Multiple myeloma not having achieved remission: Secondary | ICD-10-CM

## 2020-09-19 NOTE — Progress Notes (Signed)
  Radiation Oncology         [phone]) [phone] ________________________________  Name: SHACOYA BURKHAMMER MRN: [id number]  Date of Service: 09/19/2020  DOB: 29-Feb-1946  Post Treatment Telephone Note  Diagnosis:   Multiple Myeloma not having achieved remission.  Interval Since Last Radiation: 4 weeks   08/09/20 - 08/25/20: 1.  The right shoulder was treated to a dose of 25 Gray in 10 fractions using a 2 field isodose plan. 2.  The left shoulder was treated to a dose of 25 Gray in 10 fractions using a 2 field isodose plan  Narrative:  The patient was contacted today for routine follow-up. During treatment she did very well with radiotherapy and did not have significant desquamation.   Impression/Plan: 1. Multiple Myeloma not having achieved remission. I was unable to reach the patient by phone so I left her a voicemail. It appears though from other notes that the patient has been doing well since completion of radiotherapy. On her message I discussed that we would be happy to continue to follow her as needed, but she will also continue to follow up with Dr. Delton Coombes in medical oncology.      Carola Rhine, PAC

## 2020-09-27 ENCOUNTER — Other Ambulatory Visit (HOSPITAL_COMMUNITY): Payer: Self-pay | Admitting: *Deleted

## 2020-09-27 DIAGNOSIS — F32A Depression, unspecified: Secondary | ICD-10-CM

## 2020-09-27 MED ORDER — CITALOPRAM HYDROBROMIDE 40 MG PO TABS: 40.0000 mg | ORAL_TABLET | Freq: Every day | ORAL | 3 refills | Status: DC

## 2020-10-03 ENCOUNTER — Other Ambulatory Visit (HOSPITAL_COMMUNITY): Payer: Self-pay

## 2020-10-03 DIAGNOSIS — C9000 Multiple myeloma not having achieved remission: Secondary | ICD-10-CM

## 2020-10-03 MED ORDER — POMALIDOMIDE 2 MG PO CAPS: 2.0000 mg | ORAL_CAPSULE | Freq: Every day | ORAL | 1 refills | Status: DC

## 2020-10-03 NOTE — Telephone Encounter (Signed)
Chart reviewed. Pomalyst refilled per Dr. Delton Coombes

## 2020-10-05 ENCOUNTER — Other Ambulatory Visit (HOSPITAL_COMMUNITY): Payer: Self-pay

## 2020-10-05 MED ORDER — HYDROCODONE-ACETAMINOPHEN 7.5-325 MG PO TABS: 1.0000 | ORAL_TABLET | Freq: Four times a day (QID) | ORAL | 0 refills | Status: DC | PRN

## 2020-10-10 ENCOUNTER — Other Ambulatory Visit (HOSPITAL_COMMUNITY): Payer: Self-pay

## 2020-10-13 ENCOUNTER — Other Ambulatory Visit: Payer: Self-pay

## 2020-10-13 ENCOUNTER — Inpatient Hospital Stay (HOSPITAL_COMMUNITY)
Admission: EM | Admit: 2020-10-13 | Discharge: 2020-10-16 | DRG: 193 | Disposition: A | Discharge status: Home / Self Care | Payer: Medicare Other | Attending: Internal Medicine | Admitting: Internal Medicine

## 2020-10-13 ENCOUNTER — Emergency Department (HOSPITAL_COMMUNITY): Payer: Medicare Other

## 2020-10-13 ENCOUNTER — Encounter (HOSPITAL_COMMUNITY): Payer: Self-pay | Admitting: Emergency Medicine

## 2020-10-13 DIAGNOSIS — D849 Immunodeficiency, unspecified: Secondary | ICD-10-CM | POA: Diagnosis present

## 2020-10-13 DIAGNOSIS — Z818 Family history of other mental and behavioral disorders: Secondary | ICD-10-CM

## 2020-10-13 DIAGNOSIS — Z8249 Family history of ischemic heart disease and other diseases of the circulatory system: Secondary | ICD-10-CM | POA: Diagnosis not present

## 2020-10-13 DIAGNOSIS — Z801 Family history of malignant neoplasm of trachea, bronchus and lung: Secondary | ICD-10-CM

## 2020-10-13 DIAGNOSIS — R0902 Hypoxemia: Secondary | ICD-10-CM

## 2020-10-13 DIAGNOSIS — F32A Depression, unspecified: Secondary | ICD-10-CM | POA: Diagnosis present

## 2020-10-13 DIAGNOSIS — Z800 Family history of malignant neoplasm of digestive organs: Secondary | ICD-10-CM | POA: Diagnosis not present

## 2020-10-13 DIAGNOSIS — R109 Unspecified abdominal pain: Secondary | ICD-10-CM

## 2020-10-13 DIAGNOSIS — C9000 Multiple myeloma not having achieved remission: Secondary | ICD-10-CM | POA: Diagnosis present

## 2020-10-13 DIAGNOSIS — E1122 Type 2 diabetes mellitus with diabetic chronic kidney disease: Secondary | ICD-10-CM | POA: Diagnosis present

## 2020-10-13 DIAGNOSIS — D696 Thrombocytopenia, unspecified: Secondary | ICD-10-CM | POA: Diagnosis present

## 2020-10-13 DIAGNOSIS — Z86711 Personal history of pulmonary embolism: Secondary | ICD-10-CM | POA: Diagnosis not present

## 2020-10-13 DIAGNOSIS — N1832 Chronic kidney disease, stage 3b: Secondary | ICD-10-CM | POA: Diagnosis present

## 2020-10-13 DIAGNOSIS — I129 Hypertensive chronic kidney disease with stage 1 through stage 4 chronic kidney disease, or unspecified chronic kidney disease: Secondary | ICD-10-CM | POA: Diagnosis present

## 2020-10-13 DIAGNOSIS — I10 Essential (primary) hypertension: Secondary | ICD-10-CM | POA: Diagnosis not present

## 2020-10-13 DIAGNOSIS — Z833 Family history of diabetes mellitus: Secondary | ICD-10-CM

## 2020-10-13 DIAGNOSIS — R0602 Shortness of breath: Secondary | ICD-10-CM

## 2020-10-13 DIAGNOSIS — Z86718 Personal history of other venous thrombosis and embolism: Secondary | ICD-10-CM | POA: Diagnosis not present

## 2020-10-13 DIAGNOSIS — Z881 Allergy status to other antibiotic agents status: Secondary | ICD-10-CM

## 2020-10-13 DIAGNOSIS — J9601 Acute respiratory failure with hypoxia: Secondary | ICD-10-CM | POA: Diagnosis present

## 2020-10-13 DIAGNOSIS — Z20822 Contact with and (suspected) exposure to covid-19: Secondary | ICD-10-CM | POA: Diagnosis present

## 2020-10-13 DIAGNOSIS — Z7901 Long term (current) use of anticoagulants: Secondary | ICD-10-CM

## 2020-10-13 DIAGNOSIS — Z79899 Other long term (current) drug therapy: Secondary | ICD-10-CM | POA: Diagnosis not present

## 2020-10-13 DIAGNOSIS — Z7984 Long term (current) use of oral hypoglycemic drugs: Secondary | ICD-10-CM | POA: Diagnosis not present

## 2020-10-13 DIAGNOSIS — R609 Edema, unspecified: Secondary | ICD-10-CM

## 2020-10-13 DIAGNOSIS — Z885 Allergy status to narcotic agent status: Secondary | ICD-10-CM

## 2020-10-13 DIAGNOSIS — F419 Anxiety disorder, unspecified: Secondary | ICD-10-CM | POA: Diagnosis present

## 2020-10-13 DIAGNOSIS — J189 Pneumonia, unspecified organism: Principal | ICD-10-CM | POA: Diagnosis present

## 2020-10-13 DIAGNOSIS — Z825 Family history of asthma and other chronic lower respiratory diseases: Secondary | ICD-10-CM

## 2020-10-13 LAB — COMPREHENSIVE METABOLIC PANEL
ALT: 14 U/L (ref 0–44)
AST: 21 U/L (ref 15–41)
Albumin: 3.9 g/dL (ref 3.5–5.0)
Alkaline Phosphatase: 43 U/L (ref 38–126)
Anion gap: 15 (ref 5–15)
BUN: 25 mg/dL — ABNORMAL HIGH (ref 8–23)
CO2: 18 mmol/L — ABNORMAL LOW (ref 22–32)
Calcium: 8.5 mg/dL — ABNORMAL LOW (ref 8.9–10.3)
Chloride: 104 mmol/L (ref 98–111)
Creatinine, Ser: 1.73 mg/dL — ABNORMAL HIGH (ref 0.44–1.00)
GFR, Estimated: 31 mL/min — ABNORMAL LOW (ref >60)
Glucose, Bld: 117 mg/dL — ABNORMAL HIGH (ref 70–99)
Potassium: 4.8 mmol/L (ref 3.5–5.1)
Sodium: 137 mmol/L (ref 135–145)
Total Bilirubin: 0.6 mg/dL (ref 0.3–1.2)
Total Protein: 7.3 g/dL (ref 6.5–8.1)

## 2020-10-13 LAB — CBC WITH DIFFERENTIAL/PLATELET
Band Neutrophils: 5 %
Basophils Absolute: 0 10*3/uL (ref 0.0–0.1)
Basophils Relative: 1 %
Eosinophils Absolute: 0 10*3/uL (ref 0.0–0.5)
Eosinophils Relative: 1 %
HCT: 30.6 % — ABNORMAL LOW (ref 36.0–46.0)
Hemoglobin: 9.9 g/dL — ABNORMAL LOW (ref 12.0–15.0)
Lymphocytes Relative: 16 %
Lymphs Abs: 0.4 10*3/uL — ABNORMAL LOW (ref 0.7–4.0)
MCH: 34 pg (ref 26.0–34.0)
MCHC: 32.4 g/dL (ref 30.0–36.0)
MCV: 105.2 fL — ABNORMAL HIGH (ref 80.0–100.0)
Metamyelocytes Relative: 2 %
Monocytes Absolute: 0.4 10*3/uL (ref 0.1–1.0)
Monocytes Relative: 15 %
Neutro Abs: 1.8 10*3/uL (ref 1.7–7.7)
Neutrophils Relative %: 60 %
Platelets: 87 10*3/uL — ABNORMAL LOW (ref 150–400)
RBC: 2.91 MIL/uL — ABNORMAL LOW (ref 3.87–5.11)
RDW: 12.8 % (ref 11.5–15.5)
WBC: 2.8 10*3/uL — ABNORMAL LOW (ref 4.0–10.5)
nRBC: 0 % (ref 0.0–0.2)

## 2020-10-13 LAB — RESP PANEL BY RT-PCR (FLU A&B, COVID) ARPGX2
Influenza A by PCR: NEGATIVE
Influenza B by PCR: NEGATIVE
SARS Coronavirus 2 by RT PCR: NEGATIVE

## 2020-10-13 LAB — BLOOD GAS, ARTERIAL
Acid-Base Excess: 1.1 mmol/L (ref 0.0–2.0)
Bicarbonate: 25 mmol/L (ref 20.0–28.0)
FIO2: 21
O2 Saturation: 82.5 %
Patient temperature: 37.3
pCO2 arterial: 44.6 mmHg (ref 32.0–48.0)
pH, Arterial: 7.378 (ref 7.350–7.450)
pO2, Arterial: 49 mmHg — ABNORMAL LOW (ref 83.0–108.0)

## 2020-10-13 LAB — POC SARS CORONAVIRUS 2 AG -  ED: SARS Coronavirus 2 Ag: NEGATIVE

## 2020-10-13 LAB — CBG MONITORING, ED: Glucose-Capillary: 126 mg/dL — ABNORMAL HIGH (ref 70–99)

## 2020-10-13 LAB — LIPASE, BLOOD: Lipase: 21 U/L (ref 11–51)

## 2020-10-13 MED ORDER — ZOLPIDEM TARTRATE 5 MG PO TABS
5.0000 mg | ORAL_TABLET | Freq: Every evening | ORAL | Status: DC | PRN
  Administered 2020-10-13 – 2020-10-14 (×2): 5 mg via ORAL
  Filled 2020-10-13 (×3): qty 1

## 2020-10-13 MED ORDER — LABETALOL HCL 5 MG/ML IV SOLN: 10.0000 mg | INTRAVENOUS | Status: DC | PRN

## 2020-10-13 MED ORDER — ACETAMINOPHEN 325 MG PO TABS
650.0000 mg | ORAL_TABLET | Freq: Once | ORAL | Status: AC
  Administered 2020-10-13: 650 mg via ORAL
  Filled 2020-10-13: qty 2

## 2020-10-13 MED ORDER — PANTOPRAZOLE SODIUM 40 MG PO TBEC
40.0000 mg | DELAYED_RELEASE_TABLET | Freq: Every day | ORAL | Status: DC
  Administered 2020-10-14 – 2020-10-16 (×3): 40 mg via ORAL
  Filled 2020-10-13 (×3): qty 1

## 2020-10-13 MED ORDER — ACETAMINOPHEN 325 MG PO TABS
650.0000 mg | ORAL_TABLET | Freq: Four times a day (QID) | ORAL | Status: DC | PRN
  Administered 2020-10-14 – 2020-10-15 (×2): 650 mg via ORAL
  Filled 2020-10-13 (×2): qty 2

## 2020-10-13 MED ORDER — POLYETHYLENE GLYCOL 3350 17 G PO PACK: 17.0000 g | PACK | Freq: Every day | ORAL | Status: DC | PRN

## 2020-10-13 MED ORDER — ALPRAZOLAM 0.5 MG PO TABS
0.5000 mg | ORAL_TABLET | Freq: Two times a day (BID) | ORAL | Status: DC | PRN
  Administered 2020-10-14: 1 mg via ORAL
  Administered 2020-10-15 – 2020-10-16 (×2): 0.5 mg via ORAL
  Filled 2020-10-13 (×2): qty 1
  Filled 2020-10-13: qty 2

## 2020-10-13 MED ORDER — CITALOPRAM HYDROBROMIDE 20 MG PO TABS
40.0000 mg | ORAL_TABLET | Freq: Every day | ORAL | Status: DC
  Administered 2020-10-14 – 2020-10-16 (×3): 40 mg via ORAL
  Filled 2020-10-13 (×3): qty 2

## 2020-10-13 MED ORDER — ACETAMINOPHEN 650 MG RE SUPP: 650.0000 mg | Freq: Four times a day (QID) | RECTAL | Status: DC | PRN

## 2020-10-13 MED ORDER — IOHEXOL 350 MG/ML SOLN
60.0000 mL | Freq: Once | INTRAVENOUS | Status: AC | PRN
  Administered 2020-10-13: 60 mL via INTRAVENOUS

## 2020-10-13 MED ORDER — SODIUM CHLORIDE 0.9 % IV SOLN
500.0000 mg | Freq: Once | INTRAVENOUS | Status: AC
  Administered 2020-10-13: 500 mg via INTRAVENOUS
  Filled 2020-10-13: qty 500

## 2020-10-13 MED ORDER — ONDANSETRON HCL 4 MG PO TABS: 4.0000 mg | ORAL_TABLET | Freq: Four times a day (QID) | ORAL | Status: DC | PRN

## 2020-10-13 MED ORDER — SODIUM CHLORIDE 0.9 % IV SOLN: 1.0000 g | INTRAVENOUS | Status: DC

## 2020-10-13 MED ORDER — IPRATROPIUM-ALBUTEROL 0.5-2.5 (3) MG/3ML IN SOLN
3.0000 mL | Freq: Once | RESPIRATORY_TRACT | Status: AC
  Administered 2020-10-13: 3 mL via RESPIRATORY_TRACT
  Filled 2020-10-13: qty 3

## 2020-10-13 MED ORDER — POMALIDOMIDE 2 MG PO CAPS: 2.0000 mg | ORAL_CAPSULE | Freq: Every day | ORAL | Status: DC

## 2020-10-13 MED ORDER — SODIUM CHLORIDE 0.9 % IV SOLN: 500.0000 mg | INTRAVENOUS | Status: DC

## 2020-10-13 MED ORDER — ONDANSETRON HCL 4 MG/2ML IJ SOLN
4.0000 mg | Freq: Four times a day (QID) | INTRAMUSCULAR | Status: DC | PRN
  Administered 2020-10-15: 4 mg via INTRAVENOUS
  Filled 2020-10-13: qty 2

## 2020-10-13 MED ORDER — SODIUM CHLORIDE 0.9 % IV SOLN
1.0000 g | Freq: Once | INTRAVENOUS | Status: AC
  Administered 2020-10-13: 1 g via INTRAVENOUS
  Filled 2020-10-13: qty 10

## 2020-10-13 MED ORDER — HYDROCODONE-ACETAMINOPHEN 5-325 MG PO TABS
1.5000 | ORAL_TABLET | Freq: Four times a day (QID) | ORAL | Status: DC | PRN
  Administered 2020-10-13 – 2020-10-15 (×4): 1.5 via ORAL
  Filled 2020-10-13 (×4): qty 2

## 2020-10-13 MED ORDER — SODIUM CHLORIDE 0.9 % IV SOLN: INTRAVENOUS | Status: DC

## 2020-10-13 MED ORDER — INSULIN ASPART 100 UNIT/ML ~~LOC~~ SOLN
0.0000 [IU] | Freq: Four times a day (QID) | SUBCUTANEOUS | Status: DC
  Administered 2020-10-13: 1 [IU] via SUBCUTANEOUS
  Filled 2020-10-13: qty 1

## 2020-10-13 MED ORDER — GUAIFENESIN-DM 100-10 MG/5ML PO SYRP
10.0000 mL | ORAL_SOLUTION | Freq: Three times a day (TID) | ORAL | Status: DC
  Administered 2020-10-13 – 2020-10-14 (×2): 10 mL via ORAL
  Filled 2020-10-13 (×2): qty 10

## 2020-10-13 MED ORDER — IPRATROPIUM-ALBUTEROL 0.5-2.5 (3) MG/3ML IN SOLN
3.0000 mL | RESPIRATORY_TRACT | Status: DC | PRN
  Administered 2020-10-14: 3 mL via RESPIRATORY_TRACT
  Filled 2020-10-13 (×2): qty 3

## 2020-10-13 MED ORDER — ACYCLOVIR 800 MG PO TABS
400.0000 mg | ORAL_TABLET | Freq: Two times a day (BID) | ORAL | Status: DC
  Administered 2020-10-13 – 2020-10-16 (×6): 400 mg via ORAL
  Filled 2020-10-13 (×6): qty 1

## 2020-10-13 NOTE — ED Notes (Signed)
Pt up to bedside commode.  Pt pt had large BM.

## 2020-10-13 NOTE — ED Provider Notes (Signed)
[facility] EMERGENCY DEPARTMENT Provider Note   CSN: [id number] Arrival date & time: 10/13/20  1142     History Chief Complaint  Patient presents with  . Shortness of Breath    Alexandra Price is a 75 y.o. female.  Patient came in by POV.  Patient with a complaint of symptoms suggestive of upper respiratory or viral infection.  Even Covid-like.  Patient states that she has been vaccinated for Covid and has had the booster just in January.  In triage patient's oxygen saturations on room air were 77%.  She is not normally on oxygen.  Her complaints mostly sore throat headache not severe cough and productive cough.  Also with a complaint of some abdominal pain and nausea vomiting out in triage but patient states that the abdominal pain just really hurts mostly when she coughs.  She did not admit to any nausea vomiting to me.  Certainly no diarrhea.  He feels as if she is wheezing.  Past medical history significant for multiple myeloma.  Patient temp is 99 respiratory rate 26.  Heart rate 88 blood pressure 155/81.  Patient on 2 L of oxygen satting in the upper 90s.  Has history significant as we mentioned for multiple myeloma hypertension depression and anxiety.        Past Medical History:  Diagnosis Date  . Anxiety   . Depression   . Hypertension   . Multiple myeloma ([place])    multiple myeloma    Patient Active Problem List   Diagnosis Date Noted  . Palliative care patient 06/26/2019  . Chest pain 06/04/2018  . Depression 05/29/2018  . Hyperglycemia 05/28/2018  . HTN (hypertension) 05/28/2018  . Anxiety 05/28/2018  . Rash 05/27/2018  . Pseudomonas infection 05/26/2018  . SOB (shortness of breath) 05/24/2018  . CAP (community acquired pneumonia) 05/23/2018  . Fever 05/22/2018  . Sinus tachycardia 05/22/2018  . CKD (chronic kidney disease) stage 4, GFR 15-29 ml/min (HCC) 05/22/2018  . S/P laparoscopic appendectomy 05/20/2018  . Multiple myeloma without remission ([place])  04/15/2018  . Goals of care, counseling/discussion 04/15/2018  . Multiple myeloma not having achieved remission ([place]) 04/07/2018    Past Surgical History:  Procedure Laterality Date  . ABDOMINAL HYSTERECTOMY     total  . APPENDECTOMY    . LAPAROSCOPIC APPENDECTOMY N/A 05/20/2018   Procedure: APPENDECTOMY LAPAROSCOPIC;  Surgeon: [name], MD;  Location: [facility];  Service: General;  Laterality: N/A;  . PORTACATH PLACEMENT Right 04/14/2018   Procedure: INSERTION PORT-A-CATH;  Surgeon: [name], MD;  Location: [facility];  Service: General;  Laterality: Right;     OB History   No obstetric history on file.     Family History  Problem Relation Age of Onset  . Heart disease Mother   . Emphysema Father   . Diabetes Sister   . Depression Sister   . Cancer Brother        liver, lung, and colon  . Diabetes Brother     Social History   Tobacco Use  . Smoking status: Never Smoker  . Smokeless tobacco: Never Used  Vaping Use  . Vaping Use: Never used  Substance Use Topics  . Alcohol use: Never  . Drug use: Not Currently    Home Medications Prior to Admission medications   Medication Sig Start Date End Date Taking? Authorizing Provider  acyclovir (ZOVIRAX) 400 MG tablet Take 1 tablet (400 mg total) by mouth 2 (two) times daily. 09/08/20   [name], MD  ALPRAZolam (XANAX) 0.5 MG tablet Take one tablet during the day as needed for anxiety and two tablets at bedtime as needed for anxiety/insomnia 08/22/20   [name], MD  Calcium Carb-Cholecalciferol (CALCIUM 1000 + D PO) Take 1,000 mg by mouth daily. Take one tablet daily until next visit with oncologist.    [provider]  citalopram (CELEXA) 40 MG tablet Take 1 tablet (40 mg total) by mouth daily. 09/27/20   [name], MD  ELIQUIS 5 MG TABS tablet Take 5 mg by mouth 2 (two) times daily. 05/24/20   [provider]  glipiZIDE (GLUCOTROL XL) 5 MG 24 hr tablet Take 1 tablet by mouth  once daily with breakfast 04/14/20   [name], MD  HYDROcodone-acetaminophen (NORCO) 7.5-325 MG tablet Take 1 tablet by mouth every 6 (six) hours as needed for moderate pain. 10/05/20   [name], MD  Multiple Vitamin (THERA) TABS Take 1 tablet by mouth daily. 05/16/20   [name], MD  ondansetron (ZOFRAN) 4 MG tablet Take 1 tablet (4 mg total) by mouth every 8 (eight) hours as needed for nausea or vomiting. 06/12/18   [name], MD  pantoprazole (PROTONIX) 40 MG tablet Take 1 tablet by mouth once daily 08/23/20   [name], MD  pomalidomide (POMALYST) 2 MG capsule Take 1 capsule (2 mg total) by mouth daily. [employer] Auth # [id number]     Date Obtained 10/03/2020 10/03/20   [name], MD  rosuvastatin (CRESTOR) 10 MG tablet Take by mouth. 05/30/20 05/30/21  [provider]  terbinafine (LAMISIL) 1 % cream Apply 1 application topically 2 (two) times daily. 07/25/18   Lockamy, Randi L, NP-C  zolpidem (AMBIEN) 10 MG tablet Take 1 tablet (10 mg total) by mouth at bedtime as needed for sleep. 08/22/20   [name], MD  prochlorperazine (COMPAZINE) 10 MG tablet Take 1 tablet (10 mg total) by mouth every 6 (six) hours as needed (Nausea or vomiting). 10/08/18 01/26/19  Glennie Isle, NP-C    Allergies    Ciprofloxacin, Amoxicillin, Morphine, Morphine and related, and Augmentin [amoxicillin-pot clavulanate]  Review of Systems   Review of Systems  Constitutional: Negative for chills and fever.  HENT: Negative for congestion, rhinorrhea and sore throat.   Eyes: Negative for visual disturbance.  Respiratory: Positive for cough and shortness of breath.   Cardiovascular: Negative for chest pain and leg swelling.  Gastrointestinal: Negative for abdominal pain, diarrhea, nausea and vomiting.  Genitourinary: Negative for dysuria.  Musculoskeletal: Negative for back pain and neck pain.  Skin: Negative for rash.  Neurological: Negative  for dizziness, light-headedness and headaches.  Hematological: Does not bruise/bleed easily.  Psychiatric/Behavioral: Negative for confusion.    Physical Exam Updated Vital Signs BP (!) 141/59   Pulse 79   Temp 99.1 F (37.3 C)   Resp (!) 24   Ht 1.676 m (5' 6" )   Wt 79.4 kg   SpO2 95%   BMI 28.25 kg/m   Physical Exam Vitals and nursing note reviewed.  Constitutional:      General: She is not in acute distress.    Appearance: Normal appearance. She is well-developed and well-nourished.  HENT:     Head: Normocephalic and atraumatic.  Eyes:     Extraocular Movements: Extraocular movements intact.     Conjunctiva/sclera: Conjunctivae normal.     Pupils: Pupils are equal, round, and reactive to light.  Cardiovascular:     Rate and Rhythm: Normal rate and regular rhythm.  Heart sounds: No murmur heard.   Pulmonary:     Effort: Respiratory distress present.     Breath sounds: Normal breath sounds. No wheezing.  Abdominal:     Palpations: Abdomen is soft.     Tenderness: There is no abdominal tenderness.  Musculoskeletal:        General: No swelling or edema. Normal range of motion.     Cervical back: Normal range of motion and neck supple.  Skin:    General: Skin is warm and dry.  Neurological:     General: No focal deficit present.     Mental Status: She is alert and oriented to person, place, and time.     Cranial Nerves: No cranial nerve deficit.     Sensory: No sensory deficit.     Motor: No weakness.  Psychiatric:        Mood and Affect: Mood and affect normal.     ED Results / Procedures / Treatments   Labs (all labs ordered are listed, but only abnormal results are displayed) Labs Reviewed  CBC WITH DIFFERENTIAL/PLATELET - Abnormal; Notable for the following components:      Result Value   WBC 2.8 (*)    RBC 2.91 (*)    Hemoglobin 9.9 (*)    HCT 30.6 (*)    MCV 105.2 (*)    Platelets 87 (*)    All other components within normal limits  RESP  PANEL BY RT-PCR (FLU A&B, COVID) ARPGX2  CBC WITH DIFFERENTIAL/PLATELET  COMPREHENSIVE METABOLIC PANEL  LIPASE, BLOOD  POC SARS CORONAVIRUS 2 AG -  ED    EKG EKG Interpretation  Date/Time:  Thursday October 13 2020 11:59:49 EST Ventricular Rate:  88 PR Interval:    QRS Duration: 101 QT Interval:  364 QTC Calculation: 441 R Axis:   76 Text Interpretation: Sinus rhythm Borderline repol abnormality, diffuse leads Confirmed by Fredia Sorrow [phone]) on 10/13/2020 12:10:24 PM   Radiology DG Chest Port 1 View  Result Date: 10/13/2020 CLINICAL DATA:  Cough and shortness of breath with central chest pain for 4-5 days, history of multiple myeloma, oxygen desaturation EXAM: PORTABLE CHEST 1 VIEW COMPARISON:  Portable exam 1229 hours compared to 09/08/2018 FINDINGS: RIGHT subclavian Port-A-Cath with tip projecting over SVC. Borderline enlargement of cardiac silhouette. Mediastinal contours and pulmonary vascularity normal. Minimal chronic peribronchial thickening. No infiltrate, pleural effusion or pneumothorax. Old healed fractures of distal LEFT clavicle and upper lateral LEFT ribs. IMPRESSION: No acute abnormalities. Mild chronic bronchitic changes. Electronically Signed   By: Lavonia Dana M.D.   On: 10/13/2020 14:37    Procedures Procedures   Medications Ordered in ED Medications  ipratropium-albuterol (DUONEB) 0.5-2.5 (3) MG/3ML nebulizer solution 3 mL (3 mLs Nebulization Given 10/13/20 1403)    ED Course  I have reviewed the triage vital signs and the nursing notes.  Pertinent labs & imaging results that were available during my care of the patient were reviewed by me and considered in my medical decision making (see chart for details).    MDM Rules/Calculators/A&P                          Patient tachypneic patient states she felt like she had wheezing but did not hear any wheezing.  But did give an albuterol nebulizer treatment since her Covid antigen and her Covid and flu  test PCR was negative.  Chest x-ray was read as no acute process.  Oxygen turned off patient's  saturations went down to 86%  Patient will need CT chest.  And since her oxygen sats are down we will just do CT angio to rule out pulmonary embolus as well.  Will need labs.  CBC with differential complete metabolic panel.  And since there was some abdominal pain did order lipase.  Patient will most likely require admission because of the hypoxia.  I think we need a better view of her chest to explain what is going on.  Her overall symptoms seem to be consistent with like upper respiratory infection bronchitis since she does have a productive cough.  Could have a pneumonia has not shown up on the chest x-ray.  Also still could have Covid but unlikely with a negative PCR.  Hospitalist on admission could consider doing the Covid parameter type labs.  Patient will be turned over to the evening physician.   Final Clinical Impression(s) / ED Diagnoses Final diagnoses:  Hypoxia  SOB (shortness of breath)    Rx / DC Orders ED Discharge Orders    None       Fredia Sorrow, MD 10/13/20 1505

## 2020-10-13 NOTE — ED Notes (Signed)
Pt in CT.

## 2020-10-13 NOTE — Progress Notes (Signed)
Pomalidomide (Pomalyst) hold criteria:  ANC < 500  ANC < 1000 with fever 38.5C or above  Platelets < 25K  Bilirubin > 2 mg/dL  AST/ALT > 3x ULN  SCr > 3 mg/dL  Acute venous thromboembolic event  Active infection   D/c'd per above>>active infection  Dolly Rias RPh 10/13/2020, 10:18 PM

## 2020-10-13 NOTE — ED Provider Notes (Signed)
I have reviewed the labs and CT scan, I have ordered an ABG which shows that the patient is truly hypoxic on room air, oxygen saturation of 82% and a PO2 of 49.  I discussed the case with the hospitalist who will admit.  The patient has been made aware of the findings, agreeable to antibiotics and admission, I do not see any signs of shock at this time  CT shows lots of bronchopneumonia - scattered, no large infiltrates  Discussed with Dr. Denton Brick who will admit   Alexandra Chapel, MD 10/13/20 1739

## 2020-10-13 NOTE — ED Triage Notes (Signed)
Pt to the ED with c/o abdominal pain, N/V, Headache, sore throat, and ear pain for the last 4-5 days.  Pt states she has been vaccinated for Covid and had a booster.  Pt has Oxygen saturations of 77 on room air in Triage.

## 2020-10-13 NOTE — ED Notes (Signed)
Pt given tylenol for temp and comfort.

## 2020-10-13 NOTE — H&P (Signed)
History and Physical    Alexandra Price RFF:[id number] DOB: [birth year] DOA: 10/13/2020  PCP: [facility]   Patient coming from: Home  I have personally briefly reviewed patient's old medical records in [facility]  Chief Complaint: Difficulty breathing, cough, abdominal pain  HPI: Alexandra Price is a 75 y.o. female with medical history significant for multiple myeloma, CKD 4, hypertension. Patient presented to the ED with complaints of sore throat, cough abdominal pain headache, wheezing. Patient tells me difficulty breathing started yesterday.  Cough productive of yellow sputum started 4 days ago.  She also had a sore throat 4 days ago.  Abdominal pain started yesterday, intermittent, mostly upper abdomen.  She reports nausea without vomiting.  She takes 1 tablet of 200 mg ibuprofen on average once a week for pain.  Reports fevers at home.  Never smoked cigarettes. Stools have been formed until today, she had 2 bowel movements in the ED, the second stool was watery. Patient is vaccinated against COVID x3.   ED Course: T-max 99.9.  Respiratory rate 16-33.  Heart rate 65 -108.  Blood pressure systolic 357S to 177L.  O2 sats down 77 % on room air, currently on 3 L nasal cannula, with sats greater than 94%. ABG shows pH of 7.3.  PO2 49, PCO2 44.  WBC 2.9 consistent with prior.  Platelets low 87.  Creatinine 1.73. CTA chest-negative for PE, shows right greater than left peribronchovascular groundglass and reticulonodular opacities suspicious for infection including atypical etiology.  Covid test negative. IV ceftriaxone and azithromycin given.  Hospitalist to admit.  Review of Systems: As per HPI all other systems reviewed and negative.  Past Medical History:  Diagnosis Date  . Anxiety   . Depression   . Hypertension   . Multiple myeloma ([place])    multiple myeloma    Past Surgical History:  Procedure Laterality Date  . ABDOMINAL HYSTERECTOMY     total   . APPENDECTOMY    . LAPAROSCOPIC APPENDECTOMY N/A 05/20/2018   Procedure: APPENDECTOMY LAPAROSCOPIC;  Surgeon: [name], MD;  Location: [facility];  Service: General;  Laterality: N/A;  . PORTACATH PLACEMENT Right 04/14/2018   Procedure: INSERTION PORT-A-CATH;  Surgeon: [name], MD;  Location: [facility];  Service: General;  Laterality: Right;     reports that she has never smoked. She has never used smokeless tobacco. She reports previous drug use. She reports that she does not drink alcohol.  Allergies  Allergen Reactions  . Ciprofloxacin Anaphylaxis  . Amoxicillin   . Morphine   . Morphine And Related Nausea And Vomiting  . Augmentin [Amoxicillin-Pot Clavulanate] Other (See Comments)    Headache, insomnia    Family History  Problem Relation Age of Onset  . Heart disease Mother   . Emphysema Father   . Diabetes Sister   . Depression Sister   . Cancer Brother        liver, lung, and colon  . Diabetes Brother     Prior to Admission medications   Medication Sig Start Date End Date Taking? Authorizing Provider  acyclovir (ZOVIRAX) 400 MG tablet Take 1 tablet (400 mg total) by mouth 2 (two) times daily. 09/08/20  Yes [name], MD  ALPRAZolam Duanne Moron) 0.5 MG tablet Take one tablet during the day as needed for anxiety and two tablets at bedtime as needed for anxiety/insomnia 08/22/20  Yes [name], MD  Calcium Carb-Cholecalciferol (CALCIUM 1000 + D PO) Take 1,000 mg by mouth daily.  Take one tablet daily until next visit with oncologist.   Yes [provider]  citalopram (CELEXA) 40 MG tablet Take 1 tablet (40 mg total) by mouth daily. 09/27/20  Yes [name], MD  ELIQUIS 5 MG TABS tablet Take 5 mg by mouth 2 (two) times daily. 05/24/20  Yes [provider]  glipiZIDE (GLUCOTROL XL) 5 MG 24 hr tablet Take 1 tablet by mouth once daily with breakfast 04/14/20  Yes [name], MD  HYDROcodone-acetaminophen (NORCO) 7.5-325 MG  tablet Take 1 tablet by mouth every 6 (six) hours as needed for moderate pain. 10/05/20  Yes [name], MD  Multiple Vitamin (THERA) TABS Take 1 tablet by mouth daily. 05/16/20  Yes [name], MD  ondansetron (ZOFRAN) 4 MG tablet Take 1 tablet (4 mg total) by mouth every 8 (eight) hours as needed for nausea or vomiting. 06/12/18  Yes [name], MD  pantoprazole (PROTONIX) 40 MG tablet Take 1 tablet by mouth once daily 08/23/20  Yes [name], MD  pomalidomide (POMALYST) 2 MG capsule Take 1 capsule (2 mg total) by mouth daily. [employer] Auth # [id number]     Date Obtained 10/03/2020 10/03/20  Yes [name], MD  rosuvastatin (CRESTOR) 10 MG tablet Take 10 mg by mouth daily. 05/30/20 05/30/21 Yes [provider]  zolpidem (AMBIEN) 10 MG tablet Take 1 tablet (10 mg total) by mouth at bedtime as needed for sleep. 08/22/20  Yes [name], MD  terbinafine (LAMISIL) 1 % cream Apply 1 application topically 2 (two) times daily. Patient not taking: No sig reported 07/25/18   Francene Finders L, NP-C  prochlorperazine (COMPAZINE) 10 MG tablet Take 1 tablet (10 mg total) by mouth every 6 (six) hours as needed (Nausea or vomiting). 10/08/18 01/26/19  Glennie Isle, NP-C    Physical Exam: Vitals:   10/13/20 1700 10/13/20 1730 10/13/20 1830 10/13/20 1900  BP: (!) 195/93 (!) 169/74 (!) 165/65 (!) 147/73  Pulse: (!) 108 95 95 88  Resp: (!) 26 (!) 33 (!) 24   Temp: 99.9 F (37.7 C)     TempSrc: Oral     SpO2: 94% 96% 96% 96%  Weight:      Height:        Constitutional: NAD, calm, comfortable Vitals:   10/13/20 1700 10/13/20 1730 10/13/20 1830 10/13/20 1900  BP: (!) 195/93 (!) 169/74 (!) 165/65 (!) 147/73  Pulse: (!) 108 95 95 88  Resp: (!) 26 (!) 33 (!) 24   Temp: 99.9 F (37.7 C)     TempSrc: Oral     SpO2: 94% 96% 96% 96%  Weight:      Height:       Eyes: PERRL, lids and conjunctivae normal ENMT: Mucous membranes are moist.  Neck:  normal, supple, no masses, no thyromegaly Respiratory: clear to auscultation bilaterally, no wheezing, no crackles. Normal respiratory effort. No accessory muscle use.  Cardiovascular: Regular rate and rhythm, no murmurs / rubs / gallops. No extremity edema. 2+ pedal pulses.   Abdomen: no tenderness, no masses palpated. No hepatosplenomegaly. Bowel sounds positive.  Musculoskeletal: no clubbing / cyanosis. No joint deformity upper and lower extremities. Good ROM, no contractures. Normal muscle tone.  Skin: no rashes, lesions, ulcers. No induration Neurologic: No apparent cranial abnormality, moving extremities spontaneously. Psychiatric: Normal judgment and insight. Alert and oriented x 3. Normal mood.   Labs on Admission: I have personally reviewed following labs and imaging studies  CBC: Recent Labs  Lab 10/13/20 1216  WBC  2.8*  NEUTROABS 1.8  HGB 9.9*  HCT 30.6*  MCV 105.2*  PLT 87*   Basic Metabolic Panel: Recent Labs  Lab 10/13/20 1336  NA 137  K 4.8  CL 104  CO2 18*  GLUCOSE 117*  BUN 25*  CREATININE 1.73*  CALCIUM 8.5*   GFR: Estimated Creatinine Clearance: 31.2 mL/min (A) (by C-G formula based on SCr of 1.73 mg/dL (H)). Liver Function Tests: Recent Labs  Lab 10/13/20 1336  AST 21  ALT 14  ALKPHOS 43  BILITOT 0.6  PROT 7.3  ALBUMIN 3.9   Recent Labs  Lab 10/13/20 1336  LIPASE 21    Radiological Exams on Admission: CT Angio Chest PE W/Cm &/Or Wo Cm  Result Date: 10/13/2020 CLINICAL DATA:  Abdominal pain. Nausea and vomiting. Headache. Decreased oxygen saturation. Hypertension. Multiple myeloma. EXAM: CT ANGIOGRAPHY CHEST WITH CONTRAST TECHNIQUE: Multidetector CT imaging of the chest was performed using the standard protocol during bolus administration of intravenous contrast. Multiplanar CT image reconstructions and MIPs were obtained to evaluate the vascular anatomy. CONTRAST:  73m OMNIPAQUE IOHEXOL 350 MG/ML SOLN COMPARISON:  Chest radiograph of  earlier today. CTA chest 06/04/2018 FINDINGS: Cardiovascular: Mild motion degradation throughout. The bolus is relatively well timed. No pulmonary embolism to the large segmental level. Aortic atherosclerosis. Tortuous thoracic aorta. Mild cardiomegaly, without pericardial effusion. Mediastinum/Nodes: No mediastinal or hilar adenopathy. Lungs/Pleura: Trace left pleural fluid or thickening. Moderate centrilobular emphysema. Bibasilar scarring or subsegmental atelectasis. Diffuse, but right greater than left mild peribronchovascular ground-glass and reticulonodular opacities. Upper Abdomen: Motion degradation containing the upper abdomen. Grossly normal imaged portions of the liver, spleen, stomach, adrenal glands, left kidney. Musculoskeletal: Diffuse lytic lesions throughout the marrow space, consistent with the clinical history of multiple myeloma. Lateral left clavicular remote fracture. Left greater than right remote rib fractures. Mild superior endplate compression deformity at T11 is similar to 2019. Review of the MIP images confirms the above findings. IMPRESSION: 1. Motion and bolus timing degradation. 2. No pulmonary embolism with limitations above. 3. Right greater than left peribronchovascular ground-glass and reticulonodular opacities, suspicious for infection, including atypical etiologies. 4. Diffuse lytic lesions throughout the marrow space, consistent with the clinical history of multiple myeloma. 5. Aortic Atherosclerosis (ICD10-I70.0) and Emphysema (ICD10-J43.9). Electronically Signed   By: KAbigail MiyamotoM.D.   On: 10/13/2020 17:02   DG Chest Port 1 View  Result Date: 10/13/2020 CLINICAL DATA:  Cough and shortness of breath with central chest pain for 4-5 days, history of multiple myeloma, oxygen desaturation EXAM: PORTABLE CHEST 1 VIEW COMPARISON:  Portable exam 1229 hours compared to 09/08/2018 FINDINGS: RIGHT subclavian Port-A-Cath with tip projecting over SVC. Borderline enlargement of  cardiac silhouette. Mediastinal contours and pulmonary vascularity normal. Minimal chronic peribronchial thickening. No infiltrate, pleural effusion or pneumothorax. Old healed fractures of distal LEFT clavicle and upper lateral LEFT ribs. IMPRESSION: No acute abnormalities. Mild chronic bronchitic changes. Electronically Signed   By: MLavonia DanaM.D.   On: 10/13/2020 14:37    EKG: Independently reviewed.  Rate 88.  QTc 441.  No significant change from prior.  Assessment/Plan Principal Problem:   Pneumonia Active Problems:   Acute respiratory failure with hypoxia (HCC)   Multiple myeloma without remission (HCC)   HTN (hypertension)  Pneumonia with acute hypoxic respiratory failure- dyspnea, cough, O2 sats 77%, on 3L.  Tachypneic but not meeting sepsis criteria, she has baseline leukopenia that is unchanged.  T-max of 99.9..  Covid test negative.  CTA chest showing right greater than left peribronchial  vascular groundglass and reticulonodular opacities suspicious for infection.  Severity of hypoxia not consistent with CT findings.  Patient reported wheeze, but lung exam unremarkable at this time s/p nebulizer treatment.  Never smoker. -Continue IV ceftriaxone and azithromycin -DuoNebs as needed - N/s 100cc/hr x 15hrs -Mucolytics, supplemental oxygen  Abdominal Pain-upper abdomen, without vomiting or loose stools.  Upper abdominal CTA chest without acute abnormality. -Monitor for now -Clear liquid diet, advance as tolerated -Resume home Protonix  Thrombocytopenia-platelets 87.  Likely secondary to infection versus multiple myeloma. -Hold Eliquis for now  Multiple myeloma-not in remission.  Follows with Dr. Delton Coombes. -Resume pomalidomide  History of pulmonary embolism -Hold Eliquis for now with thrombocytopenia  CKD 3b-creatinine 1.73.  At baseline. -Monitor creatinine with contrast exposure today  Hypertension-elevated.  Not on antihypertensives. -prn labetalol for systolic  greater than 073.  Dm-random glucose 117. -Hold glipizide - ssI- S Q6h - hGBa1C  DVT prophylaxis: SCDs Code Status: Full code Family Communication: None at bedside Disposition Plan:  ~ 2 days Consults called: None Admission status: Inpt, tele I certify that at the point of admission it is my clinical judgment that the patient will require inpatient hospital care spanning beyond 2 midnights from the point of admission due to high intensity of service, high risk for further deterioration and high frequency of surveillance required.    Bethena Roys MD [facility]  10/13/2020, 8:09 PM

## 2020-10-14 ENCOUNTER — Inpatient Hospital Stay (HOSPITAL_COMMUNITY): Payer: Medicare Other

## 2020-10-14 DIAGNOSIS — J189 Pneumonia, unspecified organism: Secondary | ICD-10-CM | POA: Diagnosis not present

## 2020-10-14 LAB — CBC
HCT: 27.8 % — ABNORMAL LOW (ref 36.0–46.0)
Hemoglobin: 8.8 g/dL — ABNORMAL LOW (ref 12.0–15.0)
MCH: 34.2 pg — ABNORMAL HIGH (ref 26.0–34.0)
MCHC: 31.7 g/dL (ref 30.0–36.0)
MCV: 108.2 fL — ABNORMAL HIGH (ref 80.0–100.0)
Platelets: 85 10*3/uL — ABNORMAL LOW (ref 150–400)
RBC: 2.57 MIL/uL — ABNORMAL LOW (ref 3.87–5.11)
RDW: 12.8 % (ref 11.5–15.5)
WBC: 2.8 10*3/uL — ABNORMAL LOW (ref 4.0–10.5)
nRBC: 0 % (ref 0.0–0.2)

## 2020-10-14 LAB — BASIC METABOLIC PANEL
Anion gap: 9 (ref 5–15)
BUN: 25 mg/dL — ABNORMAL HIGH (ref 8–23)
CO2: 27 mmol/L (ref 22–32)
Calcium: 8.5 mg/dL — ABNORMAL LOW (ref 8.9–10.3)
Chloride: 103 mmol/L (ref 98–111)
Creatinine, Ser: 1.5 mg/dL — ABNORMAL HIGH (ref 0.44–1.00)
GFR, Estimated: 37 mL/min — ABNORMAL LOW (ref >60)
Glucose, Bld: 112 mg/dL — ABNORMAL HIGH (ref 70–99)
Potassium: 4.1 mmol/L (ref 3.5–5.1)
Sodium: 139 mmol/L (ref 135–145)

## 2020-10-14 LAB — CBG MONITORING, ED
Glucose-Capillary: 89 mg/dL (ref 70–99)
Glucose-Capillary: 120 mg/dL — ABNORMAL HIGH (ref 70–99)
Glucose-Capillary: 116 mg/dL — ABNORMAL HIGH (ref 70–99)

## 2020-10-14 LAB — C DIFFICILE QUICK SCREEN W PCR REFLEX
C Diff antigen: NEGATIVE
C Diff interpretation: NOT DETECTED
C Diff toxin: NEGATIVE

## 2020-10-14 LAB — GLUCOSE, CAPILLARY
Glucose-Capillary: 120 mg/dL — ABNORMAL HIGH (ref 70–99)
Glucose-Capillary: 135 mg/dL — ABNORMAL HIGH (ref 70–99)

## 2020-10-14 LAB — HEMOGLOBIN A1C
Hgb A1c MFr Bld: 5.5 % (ref 4.8–5.6)
Mean Plasma Glucose: 111.15 mg/dL

## 2020-10-14 MED ORDER — LOPERAMIDE HCL 2 MG PO CAPS
2.0000 mg | ORAL_CAPSULE | ORAL | Status: DC | PRN
  Administered 2020-10-15: 2 mg via ORAL
  Filled 2020-10-14: qty 1

## 2020-10-14 MED ORDER — VANCOMYCIN HCL 1500 MG/300ML IV SOLN: 1500.0000 mg | INTRAVENOUS | Status: DC

## 2020-10-14 MED ORDER — VANCOMYCIN HCL 1750 MG/350ML IV SOLN
1750.0000 mg | Freq: Once | INTRAVENOUS | Status: AC
  Administered 2020-10-14: 1750 mg via INTRAVENOUS
  Filled 2020-10-14: qty 350

## 2020-10-14 MED ORDER — IPRATROPIUM-ALBUTEROL 0.5-2.5 (3) MG/3ML IN SOLN
3.0000 mL | Freq: Three times a day (TID) | RESPIRATORY_TRACT | Status: DC
  Administered 2020-10-14: 3 mL via RESPIRATORY_TRACT
  Filled 2020-10-14: qty 3

## 2020-10-14 MED ORDER — IPRATROPIUM-ALBUTEROL 0.5-2.5 (3) MG/3ML IN SOLN
3.0000 mL | Freq: Four times a day (QID) | RESPIRATORY_TRACT | Status: DC
  Administered 2020-10-14 – 2020-10-16 (×8): 3 mL via RESPIRATORY_TRACT
  Filled 2020-10-14 (×8): qty 3

## 2020-10-14 MED ORDER — INSULIN ASPART 100 UNIT/ML ~~LOC~~ SOLN: 0.0000 [IU] | Freq: Every day | SUBCUTANEOUS | Status: DC

## 2020-10-14 MED ORDER — BENZONATATE 100 MG PO CAPS
100.0000 mg | ORAL_CAPSULE | Freq: Three times a day (TID) | ORAL | Status: DC
  Administered 2020-10-14 – 2020-10-16 (×7): 100 mg via ORAL
  Filled 2020-10-14 (×7): qty 1

## 2020-10-14 MED ORDER — INSULIN ASPART 100 UNIT/ML ~~LOC~~ SOLN
0.0000 [IU] | Freq: Three times a day (TID) | SUBCUTANEOUS | Status: DC
  Administered 2020-10-15: 2 [IU] via SUBCUTANEOUS

## 2020-10-14 MED ORDER — SODIUM CHLORIDE 0.9 % IV SOLN
2.0000 g | Freq: Two times a day (BID) | INTRAVENOUS | Status: DC
  Administered 2020-10-14 – 2020-10-15 (×4): 2 g via INTRAVENOUS
  Filled 2020-10-14 (×5): qty 2

## 2020-10-14 MED ORDER — DM-GUAIFENESIN ER 30-600 MG PO TB12
1.0000 | ORAL_TABLET | Freq: Two times a day (BID) | ORAL | Status: DC
  Administered 2020-10-14 – 2020-10-16 (×5): 1 via ORAL
  Filled 2020-10-14 (×5): qty 1

## 2020-10-14 NOTE — Progress Notes (Signed)
[facility] Progress Note  Patient: Alexandra Price    FGB:[id number]  DOA: 10/13/2020     Date of Service: the patient was seen and examined on 10/14/2020  Brief hospital course: Past medical history of multiple myeloma, bone marrow transplant, scheduled for a routine bone marrow biopsy this week, CKD 4, HTN.  Presents with complaints of shortness of breath cough and abdominal pain.  Found to have acute community-acquired pneumonia with sepsis.  Currently plan is continue IV antibiotics.  Assessment and Plan: 1.  Acute hypoxic respiratory failure, saturation 77% on POA Community-acquired pneumonia Immunosuppressed Presents with cough and shortness of breath. CT PE protocol negative for pulmonary embolism. Shows evidence of atypical infection. Covid test is negative. Exam shows bilateral crackles and wheezes. Initially started on IV ceftriaxone and azithromycin.  Will broaden the coverage to IV vancomycin and cefepime. Follow-up on MRSA PCR and blood cultures. Duo nebs. Low threshold to add steroids.  2.  Multiple myeloma. Thrombocytopenia. History of DVT PE. Platelet counts lower than baseline. Patient is on Eliquis at home which is currently on hold since admission. Suspect that the thrombocytopenia is acute in the setting of acute illness. CT PE already negative for pulmonary embolism. We will check out lower extremity Doppler as well for acute thrombus which might explain consumption related thrombocytopenia. Major concern is whether this is reactivation of multiple myeloma. We will monitor platelet count daily. Holding pomalidomide.  3.  Abdominal pain. Diarrhea. C. difficile negative. X-ray negative for any acute abnormality as well. Continue Imodium as needed. Continue PPI. Advancing to regular diet.  4.  CKD 3B Creatinine stable. Secondary to multiple myeloma. Monitor.  5.  Essential hypertension Blood pressure stable. Monitor.  6.  Type 2 diabetes  mellitus, controlled without any complication on long-term insulin use Holding oral hypoglycemic agent. Continuing sliding scale insulin.  Changing to NCHS from every 6 hours.   Diet: Carb modified diet DVT Prophylaxis:   SCDs Start: 10/13/20 2049    Advance goals of care discussion: Full code  Family Communication: no family was present at bedside, at the time of interview.   Disposition:  Status is: Inpatient  Remains inpatient appropriate because:Inpatient level of care appropriate due to severity of illness   Dispo: The patient is from: Home              Anticipated d/c is to: Home              Patient currently is not medically stable to d/c.   Difficult to place patient No        Subjective: No nausea no vomiting but no fever no chills.  Continues to have some cough and shortness of breath.  Reports intermittent abdominal pain crampy in nature currently resolved.  Physical Exam:  General: Appear in mild distress, no Rash; Oral Mucosa Clear, moist. no Abnormal Neck Mass Or lumps, Conjunctiva normal  Cardiovascular: S1 and S2 Present, no Murmur, Respiratory: increased respiratory effort, Bilateral Air entry present and bilateral  Crackles, Occasional wheezes Abdomen: Bowel Sound present, Soft and no tenderness Extremities: trace Pedal edema Neurology: alert and oriented to time, place, and person affect appropriate. no new focal deficit Gait not checked due to patient safety concerns    Vitals:   10/14/20 1100 10/14/20 1130 10/14/20 1200 10/14/20 1230  BP: 111/90 (!) 142/72 131/63 120/66  Pulse: 74 77 69 65  Resp: 13 18 18 20   Temp:  98.8 F (37.1 C)    TempSrc:  Oral  SpO2: 96% 96% 99% 99%  Weight:      Height:        Intake/Output Summary (Last 24 hours) at 10/14/2020 1246 Last data filed at 10/14/2020 1025 Gross per 24 hour  Intake 1534.81 ml  Output --  Net 1534.81 ml   Filed Weights   10/13/20 1200  Weight: 79.4 kg    Data Reviewed: I have  personally reviewed and interpreted daily labs, tele strips, imaging. I reviewed all nursing notes, pharmacy notes, vitals, pertinent old records I have discussed plan of care as described above with RN and patient/family.  CBC: Recent Labs  Lab 10/13/20 1216 10/14/20 0648  WBC 2.8* 2.8*  NEUTROABS 1.8  --   HGB 9.9* 8.8*  HCT 30.6* 27.8*  MCV 105.2* 108.2*  PLT 87* 85*   Basic Metabolic Panel: Recent Labs  Lab 10/13/20 1336 10/14/20 0621  NA 137 139  K 4.8 4.1  CL 104 103  CO2 18* 27  GLUCOSE 117* 112*  BUN 25* 25*  CREATININE 1.73* 1.50*  CALCIUM 8.5* 8.5*    Studies: CT Angio Chest PE W/Cm &/Or Wo Cm  Result Date: 10/13/2020 CLINICAL DATA:  Abdominal pain. Nausea and vomiting. Headache. Decreased oxygen saturation. Hypertension. Multiple myeloma. EXAM: CT ANGIOGRAPHY CHEST WITH CONTRAST TECHNIQUE: Multidetector CT imaging of the chest was performed using the standard protocol during bolus administration of intravenous contrast. Multiplanar CT image reconstructions and MIPs were obtained to evaluate the vascular anatomy. CONTRAST:  67m OMNIPAQUE IOHEXOL 350 MG/ML SOLN COMPARISON:  Chest radiograph of earlier today. CTA chest 06/04/2018 FINDINGS: Cardiovascular: Mild motion degradation throughout. The bolus is relatively well timed. No pulmonary embolism to the large segmental level. Aortic atherosclerosis. Tortuous thoracic aorta. Mild cardiomegaly, without pericardial effusion. Mediastinum/Nodes: No mediastinal or hilar adenopathy. Lungs/Pleura: Trace left pleural fluid or thickening. Moderate centrilobular emphysema. Bibasilar scarring or subsegmental atelectasis. Diffuse, but right greater than left mild peribronchovascular ground-glass and reticulonodular opacities. Upper Abdomen: Motion degradation containing the upper abdomen. Grossly normal imaged portions of the liver, spleen, stomach, adrenal glands, left kidney. Musculoskeletal: Diffuse lytic lesions throughout the  marrow space, consistent with the clinical history of multiple myeloma. Lateral left clavicular remote fracture. Left greater than right remote rib fractures. Mild superior endplate compression deformity at T11 is similar to 2019. Review of the MIP images confirms the above findings. IMPRESSION: 1. Motion and bolus timing degradation. 2. No pulmonary embolism with limitations above. 3. Right greater than left peribronchovascular ground-glass and reticulonodular opacities, suspicious for infection, including atypical etiologies. 4. Diffuse lytic lesions throughout the marrow space, consistent with the clinical history of multiple myeloma. 5. Aortic Atherosclerosis (ICD10-I70.0) and Emphysema (ICD10-J43.9). Electronically Signed   By: KAbigail MiyamotoM.D.   On: 10/13/2020 17:02   DG Abd Portable 1V  Result Date: 10/14/2020 CLINICAL DATA:  Abdominal pain with nausea and vomiting. Multiple myeloma EXAM: PORTABLE ABDOMEN - 1 VIEW COMPARISON:  May 26, 2018 FINDINGS: There is moderate stool in the colon. There is no bowel dilatation or air-fluid level to suggest bowel obstruction. No free air. There are multiple lytic appearing lesions throughout the pelvis and lower lumbar spine. There is osteoporosis. IMPRESSION: No bowel obstruction or free air. Bony lesions throughout the pelvis and lower lumbar region, felt to be indicative of known multiple myeloma. Electronically Signed   By: WLowella GripIII M.D.   On: 10/14/2020 10:32    Scheduled Meds: . acyclovir  400 mg Oral BID  . benzonatate  100 mg Oral TID  .  citalopram  40 mg Oral Daily  . dextromethorphan-guaiFENesin  1 tablet Oral BID  . insulin aspart  0-15 Units Subcutaneous TID WC  . insulin aspart  0-5 Units Subcutaneous QHS  . ipratropium-albuterol  3 mL Nebulization QID  . pantoprazole  40 mg Oral Daily   Continuous Infusions: . ceFEPime (MAXIPIME) IV Stopped (10/14/20 1025)  . [START ON 10/16/2020] vancomycin    . vancomycin 1,750 mg  (10/14/20 1143)   PRN Meds: acetaminophen **OR** acetaminophen, ALPRAZolam, HYDROcodone-acetaminophen, ipratropium-albuterol, ondansetron **OR** ondansetron (ZOFRAN) IV, polyethylene glycol, zolpidem  Time spent: 35 minutes  Author: Berle Mull, MD [facility] 10/14/2020 12:46 PM  To reach On-call, see care teams to locate the attending and reach out via [email or web address]. Between 7PM-7AM, please contact night-coverage If you still have difficulty reaching the attending provider, please page the [facility] (Director on Call) for [facility] on amion for assistance.

## 2020-10-14 NOTE — ED Notes (Signed)
Pt complaining of generalized pain, especially her back

## 2020-10-14 NOTE — Progress Notes (Signed)
Pharmacy Antibiotic Note   Alexandra Price is a 75 y.o. female admitted on 10/13/2020 with pneumonia.  Pharmacy has been consulted for Vancomycin and Cefepime dosing.  Plan: Vancomycin 1750 mg IV x 1 dose. Vancomycin 1500 mg IV every 48 hours. Expected AUC 475. Cefepime 2000 mg IV every 12 hours. Monitor labs, c/s, and vanco level as indicated.  Height: 5\' 6"  (167.6 cm) Weight: 79.4 kg (175 lb) IBW/kg (Calculated) : 59.3  Temp (24hrs), Avg:99.1 F (37.3 C), Min:98.3 F (36.8 C), Max:99.9 F (37.7 C)  Recent Labs  Lab 10/13/20 1216 10/13/20 1336 10/14/20 0621 10/14/20 0648  WBC 2.8*  --   --  2.8*  CREATININE  --  1.73* 1.50*  --     Estimated Creatinine Clearance: 36 mL/min (A) (by C-G formula based on SCr of 1.5 mg/dL (H)).    Allergies  Allergen Reactions  . Ciprofloxacin Anaphylaxis  . Amoxicillin   . Morphine   . Morphine And Related Nausea And Vomiting  . Augmentin [Amoxicillin-Pot Clavulanate] Other (See Comments)    Headache, insomnia    Antimicrobials this admission: Vanco 2/25 >> Cefepime 2/25 >> Azith/CTX 2/24 >> 2/25   Microbiology results: 2/25 MRSA PCR: pending CDiff : pending  Thank you for allowing pharmacy to be a part of this patient's care.  Ramond Craver 10/14/2020 10:34 AM

## 2020-10-14 NOTE — ED Notes (Signed)
Pt got up to bedside toilet and 02 dropped to 83% on 3lpm and she was wheezing. Respiratory called and breathing treatment gave.

## 2020-10-15 DIAGNOSIS — J189 Pneumonia, unspecified organism: Secondary | ICD-10-CM | POA: Diagnosis not present

## 2020-10-15 LAB — CBC WITH DIFFERENTIAL/PLATELET
Abs Immature Granulocytes: 0 10*3/uL (ref 0.00–0.07)
Basophils Absolute: 0 10*3/uL (ref 0.0–0.1)
Basophils Relative: 2 %
Eosinophils Absolute: 0.3 10*3/uL (ref 0.0–0.5)
Eosinophils Relative: 11 %
HCT: 28.9 % — ABNORMAL LOW (ref 36.0–46.0)
Hemoglobin: 9.1 g/dL — ABNORMAL LOW (ref 12.0–15.0)
Immature Granulocytes: 0 %
Lymphocytes Relative: 20 %
Lymphs Abs: 0.5 10*3/uL — ABNORMAL LOW (ref 0.7–4.0)
MCH: 34 pg (ref 26.0–34.0)
MCHC: 31.5 g/dL (ref 30.0–36.0)
MCV: 107.8 fL — ABNORMAL HIGH (ref 80.0–100.0)
Monocytes Absolute: 0.4 10*3/uL (ref 0.1–1.0)
Monocytes Relative: 17 %
Neutro Abs: 1.3 10*3/uL — ABNORMAL LOW (ref 1.7–7.7)
Neutrophils Relative %: 50 %
Platelets: 112 10*3/uL — ABNORMAL LOW (ref 150–400)
RBC: 2.68 MIL/uL — ABNORMAL LOW (ref 3.87–5.11)
RDW: 13 % (ref 11.5–15.5)
WBC: 2.6 10*3/uL — ABNORMAL LOW (ref 4.0–10.5)
nRBC: 0 % (ref 0.0–0.2)

## 2020-10-15 LAB — COMPREHENSIVE METABOLIC PANEL
ALT: 17 U/L (ref 0–44)
AST: 21 U/L (ref 15–41)
Albumin: 3.7 g/dL (ref 3.5–5.0)
Alkaline Phosphatase: 40 U/L (ref 38–126)
Anion gap: 6 (ref 5–15)
BUN: 21 mg/dL (ref 8–23)
CO2: 24 mmol/L (ref 22–32)
Calcium: 8.6 mg/dL — ABNORMAL LOW (ref 8.9–10.3)
Chloride: 106 mmol/L (ref 98–111)
Creatinine, Ser: 1.51 mg/dL — ABNORMAL HIGH (ref 0.44–1.00)
GFR, Estimated: 37 mL/min — ABNORMAL LOW (ref >60)
Glucose, Bld: 156 mg/dL — ABNORMAL HIGH (ref 70–99)
Potassium: 4.5 mmol/L (ref 3.5–5.1)
Sodium: 136 mmol/L (ref 135–145)
Total Bilirubin: 0.4 mg/dL (ref 0.3–1.2)
Total Protein: 7.1 g/dL (ref 6.5–8.1)

## 2020-10-15 LAB — GLUCOSE, CAPILLARY
Glucose-Capillary: 118 mg/dL — ABNORMAL HIGH (ref 70–99)
Glucose-Capillary: 127 mg/dL — ABNORMAL HIGH (ref 70–99)
Glucose-Capillary: 96 mg/dL (ref 70–99)
Glucose-Capillary: 115 mg/dL — ABNORMAL HIGH (ref 70–99)

## 2020-10-15 NOTE — Progress Notes (Signed)
[facility] Progress Note  Patient: Alexandra Price    DGU:[id number]  DOA: 10/13/2020     Date of Service: the patient was seen and examined on 10/15/2020  Brief hospital course: Past medical history of multiple myeloma, bone marrow transplant, scheduled for a routine bone marrow biopsy this week, CKD 4, HTN.  Presents with complaints of shortness of breath cough and abdominal pain.  Found to have acute community-acquired pneumonia with sepsis.  Currently plan is continue IV antibiotics.  Monitor for improvement in oxygenation  Assessment and Plan: 1.  Acute hypoxic respiratory failure, saturation 77% on POA Community-acquired pneumonia Immunosuppressed Presents with cough and shortness of breath. CT PE protocol negative for pulmonary embolism. Shows evidence of atypical infection. Covid test is negative. Exam shows bilateral crackles and wheezes. Initially started on IV ceftriaxone and azithromycin.  Will broaden the coverage to IV vancomycin and cefepime. Follow-up on MRSA PCR and blood cultures. Duo nebs. Low threshold to add steroids.  2.  Multiple myeloma. Thrombocytopenia. History of DVT PE. Platelet counts lower than baseline. Patient is on Eliquis at home which is currently on hold since admission. Suspect that the thrombocytopenia is acute in the setting of acute illness. CT PE already negative for pulmonary embolism.  Lower extremity Doppler negative for DVT as well. Major concern is whether this is reactivation of multiple myeloma. We will monitor platelet count daily.  Currently improving.  If persistent will resume Eliquis tomorrow. Holding pomalidomide.  3.  Abdominal pain. Diarrhea. C. difficile negative. X-ray negative for any acute abnormality as well. Continue Imodium as needed. Continue PPI. Advancing to regular diet.  4.  CKD 3B Creatinine stable. Secondary to multiple myeloma. Monitor.  5.  Essential hypertension Blood pressure  stable. Monitor.  6.  Type 2 diabetes mellitus, controlled without any complication on long-term insulin use Holding oral hypoglycemic agent. Continuing sliding scale insulin.  Changing to NCHS from every 6 hours.   Diet: Carb modified diet DVT Prophylaxis:   SCDs Start: 10/13/20 2049    Advance goals of care discussion: Full code  Family Communication: family was present at bedside, at the time of interview.   Disposition:  Status is: Inpatient  Remains inpatient appropriate because:Inpatient level of care appropriate due to severity of illness   Dispo: The patient is from: Home              Anticipated d/c is to: Home              Patient currently is not medically stable to d/c.   Difficult to place patient No   Subjective: Feeling better but continues to have cough and shortness of breath.  No nausea no vomiting.  Oral intake improving.  No diarrhea no constipation.  Physical Exam:  General: Appear in mild distress, no Rash; Oral Mucosa Clear, moist. no Abnormal Neck Mass Or lumps, Conjunctiva normal  Cardiovascular: S1 and S2 Present, no Murmur, Respiratory: Normal respiratory effort, Bilateral Air entry present and bilateral  Crackles, no wheezes Abdomen: Bowel Sound present, Soft and no tenderness Extremities: trace Pedal edema Neurology: alert and oriented to time, place, and person affect appropriate. no new focal deficit Gait not checked due to patient safety concerns    Vitals:   10/15/20 0425 10/15/20 0735 10/15/20 1257 10/15/20 1328  BP: 124/62   (!) 134/54  Pulse: 70   71  Resp: 16     Temp: 98.6 F (37 C)   98.3 F (36.8 C)  TempSrc: Oral  Oral  SpO2: 97% 97% 97% 95%  Weight:      Height:        Intake/Output Summary (Last 24 hours) at 10/15/2020 1734 Last data filed at 10/15/2020 1100 Gross per 24 hour  Intake 240 ml  Output --  Net 240 ml   Filed Weights   10/13/20 1200  Weight: 79.4 kg    Data Reviewed: I have personally reviewed  and interpreted daily labs, tele strips, imaging. I reviewed all nursing notes, pharmacy notes, vitals, pertinent old records I have discussed plan of care as described above with RN and patient/family.  CBC: Recent Labs  Lab 10/13/20 1216 10/14/20 0648 10/15/20 0938  WBC 2.8* 2.8* 2.6*  NEUTROABS 1.8  --  1.3*  HGB 9.9* 8.8* 9.1*  HCT 30.6* 27.8* 28.9*  MCV 105.2* 108.2* 107.8*  PLT 87* 85* 366*   Basic Metabolic Panel: Recent Labs  Lab 10/13/20 1336 10/14/20 0621 10/15/20 0938  NA 137 139 136  K 4.8 4.1 4.5  CL 104 103 106  CO2 18* 27 24  GLUCOSE 117* 112* 156*  BUN 25* 25* 21  CREATININE 1.73* 1.50* 1.51*  CALCIUM 8.5* 8.5* 8.6*    Studies: No results found.  Scheduled Meds: . acyclovir  400 mg Oral BID  . benzonatate  100 mg Oral TID  . citalopram  40 mg Oral Daily  . dextromethorphan-guaiFENesin  1 tablet Oral BID  . insulin aspart  0-15 Units Subcutaneous TID WC  . insulin aspart  0-5 Units Subcutaneous QHS  . ipratropium-albuterol  3 mL Nebulization QID  . pantoprazole  40 mg Oral Daily   Continuous Infusions: . ceFEPime (MAXIPIME) IV 2 g (10/15/20 0844)  . [START ON 10/16/2020] vancomycin     PRN Meds: acetaminophen **OR** acetaminophen, ALPRAZolam, HYDROcodone-acetaminophen, ipratropium-albuterol, loperamide, ondansetron **OR** ondansetron (ZOFRAN) IV, polyethylene glycol, zolpidem  Time spent: 35 minutes  Author: Berle Mull, MD [facility] 10/15/2020 5:34 PM  To reach On-call, see care teams to locate the attending and reach out via [email or web address]. Between 7PM-7AM, please contact night-coverage If you still have difficulty reaching the attending provider, please page the [facility] (Director on Call) for [facility] on amion for assistance.

## 2020-10-16 DIAGNOSIS — J189 Pneumonia, unspecified organism: Secondary | ICD-10-CM | POA: Diagnosis not present

## 2020-10-16 LAB — GLUCOSE, CAPILLARY: Glucose-Capillary: 100 mg/dL — ABNORMAL HIGH (ref 70–99)

## 2020-10-16 LAB — COMPREHENSIVE METABOLIC PANEL
ALT: 15 U/L (ref 0–44)
AST: 16 U/L (ref 15–41)
Albumin: 3.4 g/dL — ABNORMAL LOW (ref 3.5–5.0)
Alkaline Phosphatase: 35 U/L — ABNORMAL LOW (ref 38–126)
Anion gap: 8 (ref 5–15)
BUN: 25 mg/dL — ABNORMAL HIGH (ref 8–23)
CO2: 26 mmol/L (ref 22–32)
Calcium: 9 mg/dL (ref 8.9–10.3)
Chloride: 105 mmol/L (ref 98–111)
Creatinine, Ser: 1.64 mg/dL — ABNORMAL HIGH (ref 0.44–1.00)
GFR, Estimated: 33 mL/min — ABNORMAL LOW (ref >60)
Glucose, Bld: 114 mg/dL — ABNORMAL HIGH (ref 70–99)
Potassium: 4.9 mmol/L (ref 3.5–5.1)
Sodium: 139 mmol/L (ref 135–145)
Total Bilirubin: 0.5 mg/dL (ref 0.3–1.2)
Total Protein: 6.4 g/dL — ABNORMAL LOW (ref 6.5–8.1)

## 2020-10-16 LAB — CBC
HCT: 26.5 % — ABNORMAL LOW (ref 36.0–46.0)
Hemoglobin: 8.3 g/dL — ABNORMAL LOW (ref 12.0–15.0)
MCH: 34 pg (ref 26.0–34.0)
MCHC: 31.3 g/dL (ref 30.0–36.0)
MCV: 108.6 fL — ABNORMAL HIGH (ref 80.0–100.0)
Platelets: 122 10*3/uL — ABNORMAL LOW (ref 150–400)
RBC: 2.44 MIL/uL — ABNORMAL LOW (ref 3.87–5.11)
RDW: 12.8 % (ref 11.5–15.5)
WBC: 2.5 10*3/uL — ABNORMAL LOW (ref 4.0–10.5)
nRBC: 0 % (ref 0.0–0.2)

## 2020-10-16 LAB — MAGNESIUM: Magnesium: 2.1 mg/dL (ref 1.7–2.4)

## 2020-10-16 MED ORDER — DM-GUAIFENESIN ER 30-600 MG PO TB12: 1.0000 | ORAL_TABLET | Freq: Two times a day (BID) | ORAL | 0 refills | Status: AC

## 2020-10-16 MED ORDER — CEFDINIR 300 MG PO CAPS
300.0000 mg | ORAL_CAPSULE | Freq: Two times a day (BID) | ORAL | Status: DC
  Administered 2020-10-16: 300 mg via ORAL
  Filled 2020-10-16: qty 1

## 2020-10-16 MED ORDER — CEFDINIR 300 MG PO CAPS: 300.0000 mg | ORAL_CAPSULE | Freq: Two times a day (BID) | ORAL | 0 refills | Status: AC

## 2020-10-16 MED ORDER — LOPERAMIDE HCL 2 MG PO CAPS: 2.0000 mg | ORAL_CAPSULE | ORAL | 0 refills | Status: DC | PRN

## 2020-10-16 MED ORDER — SACCHAROMYCES BOULARDII 250 MG PO CAPS: 250.0000 mg | ORAL_CAPSULE | Freq: Two times a day (BID) | ORAL | 0 refills | Status: DC

## 2020-10-16 MED ORDER — BENZONATATE 100 MG PO CAPS: 100.0000 mg | ORAL_CAPSULE | Freq: Three times a day (TID) | ORAL | 0 refills | Status: DC

## 2020-10-16 MED ORDER — ALBUTEROL SULFATE HFA 108 (90 BASE) MCG/ACT IN AERS: 2.0000 | INHALATION_SPRAY | Freq: Four times a day (QID) | RESPIRATORY_TRACT | 0 refills | Status: DC

## 2020-10-16 MED ORDER — DOXYCYCLINE HYCLATE 100 MG PO TABS
100.0000 mg | ORAL_TABLET | Freq: Two times a day (BID) | ORAL | Status: DC
  Administered 2020-10-16: 100 mg via ORAL
  Filled 2020-10-16: qty 1

## 2020-10-16 MED ORDER — HEPARIN SOD (PORK) LOCK FLUSH 100 UNIT/ML IV SOLN
500.0000 [IU] | INTRAVENOUS | Status: AC | PRN
  Administered 2020-10-16: 500 [IU]
  Filled 2020-10-16: qty 5

## 2020-10-16 MED ORDER — DOXYCYCLINE HYCLATE 100 MG PO TABS: 100.0000 mg | ORAL_TABLET | Freq: Two times a day (BID) | ORAL | 0 refills | Status: AC

## 2020-10-16 NOTE — Discharge Summary (Signed)
[facility] Discharge Summary   Patient: Alexandra Price BHA:[id number]  PCP: [facility]  Date of admission: 10/13/2020   Date of discharge: 10/16/2020     Discharge Diagnoses:  Principal Problem:   Pneumonia Active Problems:   Multiple myeloma without remission ([place])   HTN (hypertension)   Acute respiratory failure with hypoxia ([place])   Admitted From: Home Disposition:  Home   Recommendations for Outpatient Follow-up:  1. PCP: Follow-up with PCP in 1 week follow-up with hematology as recommended 2. Follow up LABS/TEST: None   Follow-up Information    [facility] Schedule an appointment as soon as possible for a visit in 1 week(s).   Contact information: [address] [place] [address] [phone]        [name], MD. Schedule an appointment as soon as possible for a visit in 1 week(s).   Specialty: Hematology Contact information: [address] [place] [address] [phone]              Discharge Instructions    Diet - low sodium heart healthy   Complete by: As directed    Increase activity slowly   Complete by: As directed       Diet recommendation: Cardiac diet  Activity: The patient is advised to gradually reintroduce usual activities, as tolerated  Discharge Condition: stable  Code Status: Full code   History of present illness: As per the H and P dictated on admission, " Alexandra Price is a 75 y.o. female with medical history significant for multiple myeloma, CKD 4, hypertension. Patient presented to the ED with complaints of sore throat, cough abdominal pain headache, wheezing. Patient tells me difficulty breathing started yesterday.  Cough productive of yellow sputum started 4 days ago.  She also had a sore throat 4 days ago.  Abdominal pain started yesterday, intermittent, mostly upper abdomen.  She reports nausea without vomiting.  She takes 1 tablet of 200 mg  ibuprofen on average once a week for pain.  Reports fevers at home.  Never smoked cigarettes. Stools have been formed until today, she had 2 bowel movements in the ED, the second stool was watery. Patient is vaccinated against COVID x3."  Hospital Course:  Summary of her active problems in the hospital is as following.   1.  Acute hypoxic respiratory failure, saturation 77% on POA Community-acquired pneumonia Immunosuppressed Presents with cough and shortness of breath. CT PE protocol negative for pulmonary embolism. Shows evidence of atypical infection. Covid test is negative. Exam shows bilateral crackles and wheezes. Initially started on IV ceftriaxone and azithromycin.   broaden the coverage to IV vancomycin and cefepime. So far cultures are negative.  Wheezing has resolved. We will switch her to oral antibiotics with doxycycline and Omnicef. Recommend not to take her multiple myeloma medication until she is on the antibiotics. Albuterol inhaler as needed.  2.  Multiple myeloma. Thrombocytopenia. History of DVT PE. Platelet counts lower than baseline. Patient is on Eliquis at home which is currently on hold since admission. Suspect that the thrombocytopenia is acute in the setting of acute illness. CT PE already negative for pulmonary embolism.  Lower extremity Doppler negative for DVT as well. resume Eliquis Holding pomalidomide.  3.  Abdominal pain. Diarrhea. C. difficile negative. X-ray negative for any acute abnormality as well. Continue Imodium as needed. Continue PPI. Advancing to regular diet.  4.  CKD 3B Creatinine stable. Secondary to multiple myeloma. Monitor.  5.  Essential hypertension Blood pressure stable. Monitor.  6.  Type 2 diabetes mellitus, controlled without any complication on long-term insulin use Treated with sliding scale insulin here in the hospital. Resuming home regimen.  Patient was ambulatory without any assistance. On the  day of the discharge the patient's vitals were stable, and no other acute medical condition were reported by patient. The patient was felt safe to be discharge at Home with no therapy needed on discharge.  Consultants: none Procedures: none  DISCHARGE MEDICATION: Allergies as of 10/16/2020      Reactions   Ciprofloxacin Anaphylaxis   Amoxicillin    Morphine    Morphine And Related Nausea And Vomiting   Augmentin [amoxicillin-pot Clavulanate] Other (See Comments)   Headache, insomnia      Medication List    TAKE these medications   acyclovir 400 MG tablet Commonly known as: ZOVIRAX Take 1 tablet (400 mg total) by mouth 2 (two) times daily.   albuterol 108 (90 Base) MCG/ACT inhaler Commonly known as: VENTOLIN HFA Inhale 2 puffs into the lungs in the morning, at noon, in the evening, and at bedtime.   ALPRAZolam 0.5 MG tablet Commonly known as: XANAX Take one tablet during the day as needed for anxiety and two tablets at bedtime as needed for anxiety/insomnia   benzonatate 100 MG capsule Commonly known as: TESSALON Take 1 capsule (100 mg total) by mouth 3 (three) times daily.   CALCIUM 1000 + D PO Take 1,000 mg by mouth daily. Take one tablet daily until next visit with oncologist.   cefdinir 300 MG capsule Commonly known as: OMNICEF Take 1 capsule (300 mg total) by mouth 2 (two) times daily for 5 days.   citalopram 40 MG tablet Commonly known as: CELEXA Take 1 tablet (40 mg total) by mouth daily.   dextromethorphan-guaiFENesin 30-600 MG 12hr tablet Commonly known as: MUCINEX DM Take 1 tablet by mouth 2 (two) times daily for 10 days.   doxycycline 100 MG tablet Commonly known as: VIBRA-TABS Take 1 tablet (100 mg total) by mouth 2 (two) times daily for 5 days.   Eliquis 5 MG Tabs tablet Generic drug: apixaban Take 5 mg by mouth 2 (two) times daily.   glipiZIDE 5 MG 24 hr tablet Commonly known as: GLUCOTROL XL Take 1 tablet by mouth once daily with breakfast    HYDROcodone-acetaminophen 7.5-325 MG tablet Commonly known as: NORCO Take 1 tablet by mouth every 6 (six) hours as needed for moderate pain.   loperamide 2 MG capsule Commonly known as: IMODIUM Take 1 capsule (2 mg total) by mouth as needed for diarrhea or loose stools.   ondansetron 4 MG tablet Commonly known as: Zofran Take 1 tablet (4 mg total) by mouth every 8 (eight) hours as needed for nausea or vomiting.   pantoprazole 40 MG tablet Commonly known as: PROTONIX Take 1 tablet by mouth once daily   pomalidomide 2 MG capsule Commonly known as: Pomalyst Take 1 capsule (2 mg total) by mouth daily. [employer] Auth # [id number]     Date Obtained 10/03/2020   rosuvastatin 10 MG tablet Commonly known as: CRESTOR Take 10 mg by mouth daily.   saccharomyces boulardii 250 MG capsule Commonly known as: Florastor Take 1 capsule (250 mg total) by mouth 2 (two) times daily.   terbinafine 1 % cream Commonly known as: LAMISIL Apply 1 application topically 2 (two) times daily.   Thera Tabs Take 1 tablet by mouth daily.   zolpidem 10 MG tablet  Commonly known as: AMBIEN Take 1 tablet (10 mg total) by mouth at bedtime as needed for sleep.       Discharge Exam: Filed Weights   10/13/20 1200  Weight: 79.4 kg   Vitals:   10/16/20 0559 10/16/20 0920  BP: 131/70   Pulse: 68   Resp: 18   Temp: 98.3 F (36.8 C)   SpO2: 96% 94%   General: Appear in mild distress, no Rash; Oral Mucosa Clear, moist. no Abnormal Neck Mass Or lumps, Conjunctiva normal  Cardiovascular: S1 and S2 Present, no Murmur, Respiratory: Normal respiratory effort, Bilateral Air entry present and bilateral  Crackles, no wheezes Abdomen: Bowel Sound present, Soft and no tenderness Extremities: trace Pedal edema Neurology: alert and oriented to time, place, and person affect appropriate. no new focal deficit Gait not checked due to patient safety concerns    The results of significant diagnostics from this  hospitalization (including imaging, microbiology, ancillary and laboratory) are listed below for reference.    Significant Diagnostic Studies: CT Angio Chest PE W/Cm &/Or Wo Cm  Result Date: 10/13/2020 CLINICAL DATA:  Abdominal pain. Nausea and vomiting. Headache. Decreased oxygen saturation. Hypertension. Multiple myeloma. EXAM: CT ANGIOGRAPHY CHEST WITH CONTRAST TECHNIQUE: Multidetector CT imaging of the chest was performed using the standard protocol during bolus administration of intravenous contrast. Multiplanar CT image reconstructions and MIPs were obtained to evaluate the vascular anatomy. CONTRAST:  46m OMNIPAQUE IOHEXOL 350 MG/ML SOLN COMPARISON:  Chest radiograph of earlier today. CTA chest 06/04/2018 FINDINGS: Cardiovascular: Mild motion degradation throughout. The bolus is relatively well timed. No pulmonary embolism to the large segmental level. Aortic atherosclerosis. Tortuous thoracic aorta. Mild cardiomegaly, without pericardial effusion. Mediastinum/Nodes: No mediastinal or hilar adenopathy. Lungs/Pleura: Trace left pleural fluid or thickening. Moderate centrilobular emphysema. Bibasilar scarring or subsegmental atelectasis. Diffuse, but right greater than left mild peribronchovascular ground-glass and reticulonodular opacities. Upper Abdomen: Motion degradation containing the upper abdomen. Grossly normal imaged portions of the liver, spleen, stomach, adrenal glands, left kidney. Musculoskeletal: Diffuse lytic lesions throughout the marrow space, consistent with the clinical history of multiple myeloma. Lateral left clavicular remote fracture. Left greater than right remote rib fractures. Mild superior endplate compression deformity at T11 is similar to 2019. Review of the MIP images confirms the above findings. IMPRESSION: 1. Motion and bolus timing degradation. 2. No pulmonary embolism with limitations above. 3. Right greater than left peribronchovascular ground-glass and reticulonodular  opacities, suspicious for infection, including atypical etiologies. 4. Diffuse lytic lesions throughout the marrow space, consistent with the clinical history of multiple myeloma. 5. Aortic Atherosclerosis (ICD10-I70.0) and Emphysema (ICD10-J43.9). Electronically Signed   By: KAbigail MiyamotoM.D.   On: 10/13/2020 17:02   [place]Venous Img Lower Bilateral (DVT)  Result Date: 10/14/2020 CLINICAL DATA:  Bilateral lower extremity swelling, shortness of breath EXAM: BILATERAL LOWER EXTREMITY VENOUS DOPPLER ULTRASOUND TECHNIQUE: Gray-scale sonography with graded compression, as well as color Doppler and duplex ultrasound were performed to evaluate the lower extremity deep venous systems from the level of the common femoral vein and including the common femoral, femoral, profunda femoral, popliteal and calf veins including the posterior tibial, peroneal and gastrocnemius veins when visible. The superficial great saphenous vein was also interrogated. Spectral Doppler was utilized to evaluate flow at rest and with distal augmentation maneuvers in the common femoral, femoral and popliteal veins. COMPARISON:  09/15/2019 FINDINGS: RIGHT LOWER EXTREMITY Common Femoral Vein: No evidence of thrombus. Normal compressibility, respiratory phasicity and response to augmentation. Saphenofemoral Junction: No evidence of thrombus. Normal  compressibility and flow on color Doppler imaging. Profunda Femoral Vein: No evidence of thrombus. Normal compressibility and flow on color Doppler imaging. Femoral Vein: No evidence of thrombus. Normal compressibility, respiratory phasicity and response to augmentation. Popliteal Vein: No evidence of thrombus. Normal compressibility, respiratory phasicity and response to augmentation. Calf Veins: No evidence of thrombus. Normal compressibility and flow on color Doppler imaging. LEFT LOWER EXTREMITY Common Femoral Vein: No evidence of thrombus. Normal compressibility, respiratory phasicity and response to  augmentation. Saphenofemoral Junction: No evidence of thrombus. Normal compressibility and flow on color Doppler imaging. Profunda Femoral Vein: No evidence of thrombus. Normal compressibility and flow on color Doppler imaging. Femoral Vein: No evidence of thrombus. Normal compressibility, respiratory phasicity and response to augmentation. Popliteal Vein: No evidence of thrombus. Normal compressibility, respiratory phasicity and response to augmentation. Calf Veins: No evidence of thrombus. Normal compressibility and flow on color Doppler imaging. IMPRESSION: No evidence of deep venous thrombosis in either lower extremity. Electronically Signed   By: Jerilynn Mages.  Shick M.D.   On: 10/14/2020 13:46   DG Chest Port 1 View  Result Date: 10/13/2020 CLINICAL DATA:  Cough and shortness of breath with central chest pain for 4-5 days, history of multiple myeloma, oxygen desaturation EXAM: PORTABLE CHEST 1 VIEW COMPARISON:  Portable exam 1229 hours compared to 09/08/2018 FINDINGS: RIGHT subclavian Port-A-Cath with tip projecting over SVC. Borderline enlargement of cardiac silhouette. Mediastinal contours and pulmonary vascularity normal. Minimal chronic peribronchial thickening. No infiltrate, pleural effusion or pneumothorax. Old healed fractures of distal LEFT clavicle and upper lateral LEFT ribs. IMPRESSION: No acute abnormalities. Mild chronic bronchitic changes. Electronically Signed   By: Lavonia Dana M.D.   On: 10/13/2020 14:37   DG Abd Portable 1V  Result Date: 10/14/2020 CLINICAL DATA:  Abdominal pain with nausea and vomiting. Multiple myeloma EXAM: PORTABLE ABDOMEN - 1 VIEW COMPARISON:  May 26, 2018 FINDINGS: There is moderate stool in the colon. There is no bowel dilatation or air-fluid level to suggest bowel obstruction. No free air. There are multiple lytic appearing lesions throughout the pelvis and lower lumbar spine. There is osteoporosis. IMPRESSION: No bowel obstruction or free air. Bony lesions throughout  the pelvis and lower lumbar region, felt to be indicative of known multiple myeloma. Electronically Signed   By: Lowella Grip III M.D.   On: 10/14/2020 10:32    Microbiology: Recent Results (from the past 240 hour(s))  Resp Panel by RT-PCR (Flu A&B, Covid) Nasopharyngeal Swab     Status: None   Collection Time: 10/13/20 12:32 PM   Specimen: Nasopharyngeal Swab; Nasopharyngeal(NP) swabs in vial transport medium  Result Value Ref Range Status   SARS Coronavirus 2 by RT PCR NEGATIVE NEGATIVE Final    Comment: (NOTE) SARS-CoV-2 target nucleic acids are NOT DETECTED.  The SARS-CoV-2 RNA is generally detectable in upper respiratory specimens during the acute phase of infection. The lowest concentration of SARS-CoV-2 viral copies this assay can detect is 138 copies/mL. A negative result does not preclude SARS-Cov-2 infection and should not be used as the sole basis for treatment or other patient management decisions. A negative result may occur with  improper specimen collection/handling, submission of specimen other than nasopharyngeal swab, presence of viral mutation(s) within the areas targeted by this assay, and inadequate number of viral copies(<138 copies/mL). A negative result must be combined with clinical observations, patient history, and epidemiological information. The expected result is Negative.  Fact Sheet for Patients:  EntrepreneurPulse.com.au  Fact Sheet for Healthcare Providers:  IncredibleEmployment.be  This  test is no t yet approved or cleared by the [place] and  has been authorized for detection and/or diagnosis of SARS-CoV-2 by FDA under an Emergency Use Authorization (EUA). This EUA will remain  in effect (meaning this test can be used) for the duration of the COVID-19 declaration under Section 564(b)(1) of the Act, 21 U.S.C.section 360bbb-3(b)(1), unless the authorization is terminated  or revoked sooner.        Influenza A by PCR NEGATIVE NEGATIVE Final   Influenza B by PCR NEGATIVE NEGATIVE Final    Comment: (NOTE) The Xpert Xpress SARS-CoV-2/FLU/RSV plus assay is intended as an aid in the diagnosis of influenza from Nasopharyngeal swab specimens and should not be used as a sole basis for treatment. Nasal washings and aspirates are unacceptable for Xpert Xpress SARS-CoV-2/FLU/RSV testing.  Fact Sheet for Patients: EntrepreneurPulse.com.au  Fact Sheet for Healthcare Providers: IncredibleEmployment.be  This test is not yet approved or cleared by the [place] FDA and has been authorized for detection and/or diagnosis of SARS-CoV-2 by FDA under an Emergency Use Authorization (EUA). This EUA will remain in effect (meaning this test can be used) for the duration of the COVID-19 declaration under Section 564(b)(1) of the Act, 21 U.S.C. section 360bbb-3(b)(1), unless the authorization is terminated or revoked.  Performed at [facility], [address], [place], [place] [address]   C Difficile Quick Screen w PCR reflex     Status: None   Collection Time: 10/14/20 10:43 AM   Specimen: STOOL  Result Value Ref Range Status   C Diff antigen NEGATIVE NEGATIVE Final   C Diff toxin NEGATIVE NEGATIVE Final   C Diff interpretation No C. difficile detected.  Final    Comment: Performed at [facility], [address] [place], [place] [address]     Labs: CBC: Recent Labs  Lab 10/13/20 1216 10/14/20 0648 10/15/20 0938 10/16/20 0552  WBC 2.8* 2.8* 2.6* 2.5*  NEUTROABS 1.8  --  1.3*  --   HGB 9.9* 8.8* 9.1* 8.3*  HCT 30.6* 27.8* 28.9* 26.5*  MCV 105.2* 108.2* 107.8* 108.6*  PLT 87* 85* 112* 631*   Basic Metabolic Panel: Recent Labs  Lab 10/13/20 1336 10/14/20 0621 10/15/20 0938 10/16/20 0552  NA 137 139 136 139  K 4.8 4.1 4.5 4.9  CL 104 103 106 105  CO2 18* _0 GLUCOSE 117* 112* 156* 114*  BUN 25* 25* 21 25*  CREATININE 1.73* 1.50*  1.51* 1.64*  CALCIUM 8.5* 8.5* 8.6* 9.0  MG  --   --   --  2.1   Liver Function Tests: Recent Labs  Lab 10/13/20 1336 10/15/20 0938 10/16/20 0552  AST _1 ALT _2 ALKPHOS 43 40 35*  BILITOT 0.6 0.4 0.5  PROT 7.3 7.1 6.4*  ALBUMIN 3.9 3.7 3.4*   CBG: Recent Labs  Lab 10/15/20 0733 10/15/20 1136 10/15/20 1554 10/15/20 2232 10/16/20 0730  GLUCAP 118* 127* 96 115* 100*    Time spent: 35 minutes  Signed:  Berle Mull  [facility] 10/16/2020 4:54 PM

## 2020-11-01 ENCOUNTER — Other Ambulatory Visit (HOSPITAL_COMMUNITY): Payer: Self-pay

## 2020-11-01 ENCOUNTER — Other Ambulatory Visit (HOSPITAL_COMMUNITY): Payer: Self-pay | Admitting: Hematology

## 2020-11-01 DIAGNOSIS — C9000 Multiple myeloma not having achieved remission: Secondary | ICD-10-CM

## 2020-11-01 MED ORDER — POMALIDOMIDE 2 MG PO CAPS: 2.0000 mg | ORAL_CAPSULE | Freq: Every day | ORAL | 1 refills | Status: DC

## 2020-11-01 NOTE — Telephone Encounter (Signed)
Chart reviewed. Pomalyst refilled per Dr. Delton Coombes

## 2020-11-01 NOTE — Telephone Encounter (Signed)
Pomalyst cancelled at [facility] by [facility] and sent to [facility] per Dr. Delton Coombes

## 2020-11-02 ENCOUNTER — Other Ambulatory Visit (HOSPITAL_COMMUNITY): Payer: Self-pay

## 2020-11-02 MED ORDER — HYDROCODONE-ACETAMINOPHEN 7.5-325 MG PO TABS: 1.0000 | ORAL_TABLET | Freq: Four times a day (QID) | ORAL | 0 refills | Status: DC | PRN

## 2020-11-07 ENCOUNTER — Inpatient Hospital Stay (HOSPITAL_COMMUNITY): Payer: Medicare Other | Attending: Hematology

## 2020-11-07 ENCOUNTER — Other Ambulatory Visit: Payer: Self-pay

## 2020-11-07 ENCOUNTER — Encounter (HOSPITAL_COMMUNITY): Payer: Self-pay

## 2020-11-07 ENCOUNTER — Inpatient Hospital Stay (HOSPITAL_COMMUNITY): Payer: Medicare Other

## 2020-11-07 DIAGNOSIS — Z7984 Long term (current) use of oral hypoglycemic drugs: Secondary | ICD-10-CM | POA: Insufficient documentation

## 2020-11-07 DIAGNOSIS — Z801 Family history of malignant neoplasm of trachea, bronchus and lung: Secondary | ICD-10-CM | POA: Diagnosis not present

## 2020-11-07 DIAGNOSIS — Z800 Family history of malignant neoplasm of digestive organs: Secondary | ICD-10-CM | POA: Diagnosis not present

## 2020-11-07 DIAGNOSIS — I129 Hypertensive chronic kidney disease with stage 1 through stage 4 chronic kidney disease, or unspecified chronic kidney disease: Secondary | ICD-10-CM | POA: Insufficient documentation

## 2020-11-07 DIAGNOSIS — I2699 Other pulmonary embolism without acute cor pulmonale: Secondary | ICD-10-CM | POA: Diagnosis not present

## 2020-11-07 DIAGNOSIS — Z79899 Other long term (current) drug therapy: Secondary | ICD-10-CM | POA: Diagnosis not present

## 2020-11-07 DIAGNOSIS — D631 Anemia in chronic kidney disease: Secondary | ICD-10-CM | POA: Diagnosis not present

## 2020-11-07 DIAGNOSIS — D696 Thrombocytopenia, unspecified: Secondary | ICD-10-CM | POA: Insufficient documentation

## 2020-11-07 DIAGNOSIS — M79629 Pain in unspecified upper arm: Secondary | ICD-10-CM | POA: Insufficient documentation

## 2020-11-07 DIAGNOSIS — M546 Pain in thoracic spine: Secondary | ICD-10-CM | POA: Insufficient documentation

## 2020-11-07 DIAGNOSIS — C9000 Multiple myeloma not having achieved remission: Secondary | ICD-10-CM | POA: Insufficient documentation

## 2020-11-07 DIAGNOSIS — N189 Chronic kidney disease, unspecified: Secondary | ICD-10-CM | POA: Diagnosis not present

## 2020-11-07 DIAGNOSIS — Z7901 Long term (current) use of anticoagulants: Secondary | ICD-10-CM | POA: Diagnosis not present

## 2020-11-07 DIAGNOSIS — Z23 Encounter for immunization: Secondary | ICD-10-CM | POA: Insufficient documentation

## 2020-11-07 DIAGNOSIS — F419 Anxiety disorder, unspecified: Secondary | ICD-10-CM | POA: Diagnosis not present

## 2020-11-07 DIAGNOSIS — C9001 Multiple myeloma in remission: Secondary | ICD-10-CM

## 2020-11-07 LAB — LACTATE DEHYDROGENASE: LDH: 124 U/L (ref 98–192)

## 2020-11-07 LAB — COMPREHENSIVE METABOLIC PANEL
ALT: 13 U/L (ref 0–44)
AST: 17 U/L (ref 15–41)
Albumin: 3.9 g/dL (ref 3.5–5.0)
Alkaline Phosphatase: 41 U/L (ref 38–126)
Anion gap: 8 (ref 5–15)
BUN: 24 mg/dL — ABNORMAL HIGH (ref 8–23)
CO2: 27 mmol/L (ref 22–32)
Calcium: 8.9 mg/dL (ref 8.9–10.3)
Chloride: 105 mmol/L (ref 98–111)
Creatinine, Ser: 1.6 mg/dL — ABNORMAL HIGH (ref 0.44–1.00)
GFR, Estimated: 34 mL/min — ABNORMAL LOW (ref >60)
Glucose, Bld: 101 mg/dL — ABNORMAL HIGH (ref 70–99)
Potassium: 4.1 mmol/L (ref 3.5–5.1)
Sodium: 140 mmol/L (ref 135–145)
Total Bilirubin: 0.5 mg/dL (ref 0.3–1.2)
Total Protein: 6.6 g/dL (ref 6.5–8.1)

## 2020-11-07 LAB — CBC WITH DIFFERENTIAL/PLATELET
Abs Immature Granulocytes: 0 10*3/uL (ref 0.00–0.07)
Basophils Absolute: 0 10*3/uL (ref 0.0–0.1)
Basophils Relative: 1 %
Eosinophils Absolute: 0.3 10*3/uL (ref 0.0–0.5)
Eosinophils Relative: 10 %
HCT: 29.3 % — ABNORMAL LOW (ref 36.0–46.0)
Hemoglobin: 9.4 g/dL — ABNORMAL LOW (ref 12.0–15.0)
Immature Granulocytes: 0 %
Lymphocytes Relative: 30 %
Lymphs Abs: 0.8 10*3/uL (ref 0.7–4.0)
MCH: 34.4 pg — ABNORMAL HIGH (ref 26.0–34.0)
MCHC: 32.1 g/dL (ref 30.0–36.0)
MCV: 107.3 fL — ABNORMAL HIGH (ref 80.0–100.0)
Monocytes Absolute: 0.5 10*3/uL (ref 0.1–1.0)
Monocytes Relative: 18 %
Neutro Abs: 1.1 10*3/uL — ABNORMAL LOW (ref 1.7–7.7)
Neutrophils Relative %: 41 %
Platelets: 79 10*3/uL — ABNORMAL LOW (ref 150–400)
RBC: 2.73 MIL/uL — ABNORMAL LOW (ref 3.87–5.11)
RDW: 13.3 % (ref 11.5–15.5)
WBC: 2.7 10*3/uL — ABNORMAL LOW (ref 4.0–10.5)
nRBC: 0 % (ref 0.0–0.2)

## 2020-11-07 MED ORDER — HEPARIN SOD (PORK) LOCK FLUSH 100 UNIT/ML IV SOLN
500.0000 [IU] | Freq: Once | INTRAVENOUS | Status: AC
  Administered 2020-11-07: 500 [IU] via INTRAVENOUS

## 2020-11-07 MED ORDER — SODIUM CHLORIDE 0.9% FLUSH
10.0000 mL | Freq: Once | INTRAVENOUS | Status: AC
  Administered 2020-11-07: 10 mL via INTRAVENOUS

## 2020-11-07 NOTE — Progress Notes (Signed)
Alexandra Price presented for Portacath access and flush.  Portacath located right chest wall accessed with  H 20 needle.  Good blood return present. Portacath flushed with 73ml NS and 500U/4ml Heparin and needle removed intact.  Procedure tolerated well and without incident.  Pt stable during and after procedure.  Discharged in stable condition ambulatory.

## 2020-11-07 NOTE — Patient Instructions (Signed)
[place] at [facility]  Discharge Instructions:  Port flushed today and blood work drawn.  Follow up as scheduled. _______________________________________________________________  Thank you for choosing [place] at [facility] to provide your oncology and hematology care.  To afford each patient quality time with our providers, please arrive at least 15 minutes before your scheduled appointment.  You need to re-schedule your appointment if you arrive 10 or more minutes late.  We strive to give you quality time with our providers, and arriving late affects you and other patients whose appointments are after yours.  Also, if you no show three or more times for appointments you may be dismissed from the clinic.  Again, thank you for choosing [place] at [facility] hope is that these requests will allow you access to exceptional care and in a timely manner. _______________________________________________________________  If you have questions after your visit, please contact our office at [phone] between the hours of 8:30 a.m. and 5:00 p.m. Voicemails left after 4:30 p.m. will not be returned until the following business day. _______________________________________________________________  For prescription refill requests, have your pharmacy contact our office. _______________________________________________________________  Recommendations made by the consultant and any test results will be sent to your referring physician. _______________________________________________________________

## 2020-11-08 LAB — KAPPA/LAMBDA LIGHT CHAINS
Kappa free light chain: 35.9 mg/L — ABNORMAL HIGH (ref 3.3–19.4)
Kappa, lambda light chain ratio: 1.4 (ref 0.26–1.65)
Lambda free light chains: 25.6 mg/L (ref 5.7–26.3)

## 2020-11-09 LAB — PROTEIN ELECTROPHORESIS, SERUM
A/G Ratio: 1.6 (ref 0.7–1.7)
Albumin ELP: 3.9 g/dL (ref 2.9–4.4)
Alpha-1-Globulin: 0.2 g/dL (ref 0.0–0.4)
Alpha-2-Globulin: 0.8 g/dL (ref 0.4–1.0)
Beta Globulin: 0.7 g/dL (ref 0.7–1.3)
Gamma Globulin: 0.8 g/dL (ref 0.4–1.8)
Globulin, Total: 2.5 g/dL (ref 2.2–3.9)
Total Protein ELP: 6.4 g/dL (ref 6.0–8.5)

## 2020-11-10 LAB — IMMUNOFIXATION ELECTROPHORESIS
IgA: 83 mg/dL (ref 64–422)
IgG (Immunoglobin G), Serum: 797 mg/dL (ref 586–1602)
IgM (Immunoglobulin M), Srm: 20 mg/dL — ABNORMAL LOW (ref 26–217)
Total Protein ELP: 6.2 g/dL (ref 6.0–8.5)

## 2020-11-14 ENCOUNTER — Inpatient Hospital Stay (HOSPITAL_BASED_OUTPATIENT_CLINIC_OR_DEPARTMENT_OTHER): Payer: Medicare Other | Admitting: Hematology

## 2020-11-14 ENCOUNTER — Other Ambulatory Visit: Payer: Self-pay

## 2020-11-14 VITALS — BP 154/70 | HR 51 | Temp 97.2°F | Resp 20 | Wt 174.6 lb

## 2020-11-14 DIAGNOSIS — C9000 Multiple myeloma not having achieved remission: Secondary | ICD-10-CM | POA: Diagnosis not present

## 2020-11-14 DIAGNOSIS — C9001 Multiple myeloma in remission: Secondary | ICD-10-CM | POA: Diagnosis not present

## 2020-11-14 MED ORDER — PNEUMOCOCCAL VAC POLYVALENT 25 MCG/0.5ML IJ INJ
0.5000 mL | INJECTION | Freq: Once | INTRAMUSCULAR | Status: AC
  Administered 2020-11-14: 0.5 mL via INTRAMUSCULAR
  Filled 2020-11-14: qty 0.5

## 2020-11-14 MED ORDER — MEASLES, MUMPS & RUBELLA VAC IJ SOLR
0.5000 mL | Freq: Once | INTRAMUSCULAR | Status: AC
  Administered 2020-11-14: 0.5 mL via SUBCUTANEOUS
  Filled 2020-11-14: qty 0.5

## 2020-11-14 NOTE — Progress Notes (Signed)
[name], [place] [address]   CLINIC:  Medical Oncology/Hematology  PCP:  [facility] / [place] [address]  [phone]  REASON FOR VISIT:  Follow-up for multiple myeloma  PRIOR THERAPY:  1. KPD x 4 cycles from 04/17/2018 to 08/21/2018. 2. Stem cell transplant on 10/23/2018. 3. Left and right humerus 25 Gy in 10 fractions from 08/23/2020 to 08/25/2020.  CURRENT THERAPY: Maintenance Pomalyst 3/4 weeks  INTERVAL HISTORY:  Alexandra Price, a 75 y.o. female, returns for routine follow-up for her multiple myeloma. Alexandra Price was last seen on 09/12/2020.  Today she reports feeling okay. She complains of having mid thoracic back pain for the past 1 week which is waxing and waning. She continues taking Pomalyst 3 weeks on with 1 week off. She continues taking Norco every 6 hours at needed for the pain and if the pain is not as bad, she takes an ibuprofen.  She will be getting the MMR and pneumococcal vaccines today.   REVIEW OF SYSTEMS:  Review of Systems  Constitutional: Positive for fatigue (25%). Negative for appetite change.  Musculoskeletal: Positive for back pain (8/10 mid thoracic back pain waxing and waning).  All other systems reviewed and are negative.   PAST MEDICAL/SURGICAL HISTORY:  Past Medical History:  Diagnosis Date  . Anxiety   . Depression   . Hypertension   . Multiple myeloma ([place])    multiple myeloma   Past Surgical History:  Procedure Laterality Date  . ABDOMINAL HYSTERECTOMY     total  . APPENDECTOMY    . LAPAROSCOPIC APPENDECTOMY N/A 05/20/2018   Procedure: APPENDECTOMY LAPAROSCOPIC;  Surgeon: [name], MD;  Location: [facility];  Service: General;  Laterality: N/A;  . PORTACATH PLACEMENT Right 04/14/2018   Procedure: INSERTION PORT-A-CATH;  Surgeon: [name], MD;  Location: [facility];  Service: General;  Laterality: Right;    SOCIAL HISTORY:  Social History    Socioeconomic History  . Marital status: Legally Separated    Spouse name: Not on file  . Number of children: 6  . Years of education: Not on file  . Highest education level: Not on file  Occupational History    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  Tobacco Use  . Smoking status: Never Smoker  . Smokeless tobacco: Never Used  Vaping Use  . Vaping Use: Never used  Substance and Sexual Activity  . Alcohol use: Never  . Drug use: Not Currently  . Sexual activity: Not Currently  Other Topics Concern  . Not on file  Social History Narrative  . Not on file   Social Determinants of Health   Financial Resource Strain: Low Risk   . Difficulty of Paying Living Expenses: Not hard at all  Food Insecurity: No Food Insecurity  . Worried About Charity fundraiser in the Last Year: Never true  . Ran Out of Food in the Last Year: Never true  Transportation Needs: No Transportation Needs  . Lack of Transportation (Medical): No  . Lack of Transportation (Non-Medical): No  Physical Activity: Inactive  . Days of Exercise per Week: 0 days  . Minutes of Exercise per Session: 0 min  Stress: No Stress Concern Present  . Feeling of Stress : Not at all  Social Connections: Moderately Isolated  . Frequency of Communication with Friends and Family: More than three times a week  . Frequency of Social Gatherings with Friends and Family: Twice a week  .  Attends Religious Services: 1 to 4 times per year  . Active Member of Clubs or Organizations: No  . Attends Archivist Meetings: Never  . Marital Status: Separated  Intimate Partner Violence: Not At Risk  . Fear of Current or Ex-Partner: No  . Emotionally Abused: No  . Physically Abused: No  . Sexually Abused: No    FAMILY HISTORY:  Family History  Problem Relation Age of Onset  . Heart disease Mother   . Emphysema Father   . Diabetes Sister   . Depression Sister   . Cancer Brother        liver, lung, and colon  . Diabetes  Brother     CURRENT MEDICATIONS:  Current Outpatient Medications  Medication Sig Dispense Refill  . acyclovir (ZOVIRAX) 400 MG tablet Take 1 tablet (400 mg total) by mouth 2 (two) times daily. 60 tablet 4  . albuterol (VENTOLIN HFA) 108 (90 Base) MCG/ACT inhaler Inhale 2 puffs into the lungs in the morning, at noon, in the evening, and at bedtime. 8 g 0  . ALPRAZolam (XANAX) 0.5 MG tablet Take one tablet during the day as needed for anxiety and two tablets at bedtime as needed for anxiety/insomnia 90 tablet 3  . benzonatate (TESSALON) 100 MG capsule Take 1 capsule (100 mg total) by mouth 3 (three) times daily. 20 capsule 0  . Calcium Carb-Cholecalciferol (CALCIUM 1000 + D PO) Take 1,000 mg by mouth daily. Take one tablet daily until next visit with oncologist.    . citalopram (CELEXA) 40 MG tablet Take 1 tablet (40 mg total) by mouth daily. 90 tablet 3  . ELIQUIS 5 MG TABS tablet Take 5 mg by mouth 2 (two) times daily.    Marland Kitchen glipiZIDE (GLUCOTROL XL) 5 MG 24 hr tablet Take 1 tablet by mouth once daily with breakfast 90 tablet 2  . HYDROcodone-acetaminophen (NORCO) 7.5-325 MG tablet Take 1 tablet by mouth every 6 (six) hours as needed for moderate pain. 120 tablet 0  . loperamide (IMODIUM) 2 MG capsule Take 1 capsule (2 mg total) by mouth as needed for diarrhea or loose stools. 30 capsule 0  . Multiple Vitamin (THERA) TABS Take 1 tablet by mouth daily. 30 tablet 5  . ondansetron (ZOFRAN) 4 MG tablet Take 1 tablet (4 mg total) by mouth every 8 (eight) hours as needed for nausea or vomiting. 20 tablet 1  . pantoprazole (PROTONIX) 40 MG tablet Take 1 tablet by mouth once daily 90 tablet 0  . POMALYST 2 MG capsule TAKE 1 CAPSULE BY MOUTH ONCE DAILY 21 capsule 1  . rosuvastatin (CRESTOR) 10 MG tablet Take 10 mg by mouth daily.    Marland Kitchen saccharomyces boulardii (FLORASTOR) 250 MG capsule Take 1 capsule (250 mg total) by mouth 2 (two) times daily. 20 capsule 0  . terbinafine (LAMISIL) 1 % cream Apply 1  application topically 2 (two) times daily. 30 g 0  . zolpidem (AMBIEN) 10 MG tablet Take 1 tablet (10 mg total) by mouth at bedtime as needed for sleep. 30 tablet 2   Current Facility-Administered Medications  Medication Dose Route Frequency Provider Last Rate Last Admin  . measles, mumps & rubella vaccine (MMR) injection 0.5 mL  0.5 mL Subcutaneous Once [name], MD      . pneumococcal 23 valent vaccine (PNEUMOVAX-23) injection 0.5 mL  0.5 mL Intramuscular Once [name], MD        ALLERGIES:  Allergies  Allergen Reactions  . Ciprofloxacin Anaphylaxis  .  Amoxicillin   . Morphine   . Morphine And Related Nausea And Vomiting  . Augmentin [Amoxicillin-Pot Clavulanate] Other (See Comments)    Headache, insomnia    PHYSICAL EXAM:  Performance status (ECOG): 1 - Symptomatic but completely ambulatory  Vitals:   11/14/20 1441  BP: (!) 154/70  Pulse: (!) 51  Resp: 20  Temp: (!) 97.2 F (36.2 C)  SpO2: 98%   Wt Readings from Last 3 Encounters:  11/14/20 174 lb 9.6 oz (79.2 kg)  10/13/20 175 lb (79.4 kg)  09/12/20 173 lb 11.2 oz (78.8 kg)   Physical Exam Vitals reviewed.  Constitutional:      Appearance: Normal appearance.  Cardiovascular:     Rate and Rhythm: Normal rate and regular rhythm.     Pulses: Normal pulses.     Heart sounds: Normal heart sounds.  Pulmonary:     Effort: Pulmonary effort is normal.     Breath sounds: Normal breath sounds.  Chest:  Breasts:     Right: No axillary adenopathy or supraclavicular adenopathy.     Left: No axillary adenopathy or supraclavicular adenopathy.    Musculoskeletal:     Right lower leg: No edema.     Left lower leg: No edema.  Lymphadenopathy:     Cervical: No cervical adenopathy.     Upper Body:     Right upper body: No supraclavicular, axillary or pectoral adenopathy.     Left upper body: No supraclavicular, axillary or pectoral adenopathy.  Neurological:     General: No focal deficit present.      Mental Status: She is alert and oriented to person, place, and time.  Psychiatric:        Mood and Affect: Mood normal.        Behavior: Behavior normal.     LABORATORY DATA:  I have reviewed the labs as listed.  CBC Latest Ref Rng & Units 11/07/2020 10/16/2020 10/15/2020  WBC 4.0 - 10.5 K/uL 2.7(L) 2.5(L) 2.6(L)  Hemoglobin 12.0 - 15.0 g/dL 9.4(L) 8.3(L) 9.1(L)  Hematocrit 36.0 - 46.0 % 29.3(L) 26.5(L) 28.9(L)  Platelets 150 - 400 K/uL 79(L) 122(L) 112(L)   CMP Latest Ref Rng & Units 11/07/2020 10/16/2020 10/15/2020  Glucose 70 - 99 mg/dL 101(H) 114(H) 156(H)  BUN 8 - 23 mg/dL 24(H) 25(H) 21  Creatinine 0.44 - 1.00 mg/dL 1.60(H) 1.64(H) 1.51(H)  Sodium 135 - 145 mmol/L 140 139 136  Potassium 3.5 - 5.1 mmol/L 4.1 4.9 4.5  Chloride 98 - 111 mmol/L 105 105 106  CO2 22 - 32 mmol/L _0 Calcium 8.9 - 10.3 mg/dL 8.9 9.0 8.6(L)  Total Protein 6.5 - 8.1 g/dL 6.6 6.4(L) 7.1  Total Bilirubin 0.3 - 1.2 mg/dL 0.5 0.5 0.4  Alkaline Phos 38 - 126 U/L 41 35(L) 40  AST 15 - 41 U/L _1 ALT 0 - 44 U/L _2 Component Value Date/Time   RBC 2.73 (L) 11/07/2020 1324   MCV 107.3 (H) 11/07/2020 1324   MCH 34.4 (H) 11/07/2020 1324   MCHC 32.1 11/07/2020 1324   RDW 13.3 11/07/2020 1324   LYMPHSABS 0.8 11/07/2020 1324   MONOABS 0.5 11/07/2020 1324   EOSABS 0.3 11/07/2020 1324   BASOSABS 0.0 11/07/2020 1324   Lab Results  Component Value Date   LDH 124 11/07/2020   LDH 129 09/01/2020   LDH 125 06/29/2020   Lab Results  Component Value Date   TOTALPROTELP 6.4 11/07/2020  TOTALPROTELP 6.2 11/07/2020   ALBUMINELP 3.9 11/07/2020   A1GS 0.2 11/07/2020   A2GS 0.8 11/07/2020   BETS 0.7 11/07/2020   GAMS 0.8 11/07/2020   MSPIKE Not Observed 11/07/2020   SPEI Comment 11/07/2020    Lab Results  Component Value Date   KPAFRELGTCHN 35.9 (H) 11/07/2020   LAMBDASER 25.6 11/07/2020   KAPLAMBRATIO 1.40 11/07/2020    DIAGNOSTIC IMAGING:  I have independently reviewed the  scans and discussed with the patient. No results found.   ASSESSMENT:  1. IgG lambda plasma cell myeloma, stage II, standard risk: -4 cycles of KPD from 04/17/2018 through 08/21/2018, stem cell transplant on 10/23/2018. -PET scan on 01/21/2019 showed multiple bone lesions but without any hypermetabolic activity. -BMBX on 01/21/2019 with normocellular marrow with no increase in plasma cells. Normal FISH. MRD results negative. -Maintenance pomalidomide 2 mg 3 weeks on/1 week off started on 03/10/2019. -Bone marrow biopsy on 10/29/2019 shows trilineage hematopoiesis with no evidence of plasma cells. Chromosome analysis and FISH are normal. -BM BX on 10/27/2020 at [facility] with 30 to 40%, TLH.  2% of the total cells are CD 138+ plasma cells.  2. Pulmonary embolism: -CT angiogram on 04/21/2020 showed filling defect at [facility]. -VQ scan confirmed pulmonary embolism. -She is on Eliquis.   PLAN:  1. IgG lambda plasma cell myeloma: -She is taking Pomalyst maintenance 2 mg 3 weeks on 1 week off. -We reviewed results of bone marrow biopsy on 10/27/2020 which showed normocellular marrow for age, 93 to 40% with trilineage hematopoiesis.  2% of the cells composed by CD138 positive cells. -Reviewed myeloma labs from 11/07/2020 which showed no M spike.  Immunofixation normal.  Kappa light chains elevated at 35.9 with ratio of 1.4. -She has moderate thrombocytopenia with platelet count 79.  She finished antibiotic a week prior to the labs, which could be contributing to thrombocytopenia. -Creatinine is stable at 1.6, calcium normal at 8.9.  2. Back pain: -She reports mid back pain which is new since last 1 week. -She is taking hydrocodone every 6 hours. -Recommend MRI of the thoracic spine with and without contrast.  3. Myeloma bone disease: -Denosumab on hold due to exposure to the bone in the right lower jaw and molar area. -Continue calcium and vitamin D  supplements.  4. Anxiety: -Continue Xanax twice daily as needed.  5. Sleeping difficulty: -Continue Ambien 10 mg as needed.  6. Normocytic anemia: -Combination anemia from CKD, myelosuppression and pomalidomide. -We will check nutritional deficiency panel.  7.Pulmonary embolism: -Continue Eliquis.  No bleeding issues.  8. 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 -He underwent 10 treatments of radiation.  Pain improved.  Orders placed this encounter:  Orders Placed This Encounter  Procedures  . MR Thoracic Spine W Wo Contrast     [name], MD [place] [phone]   I, Milinda Antis, am acting as a scribe for Dr. Sanda Linger.  I, [name] MD, have reviewed the above documentation for accuracy and completeness, and I agree with the above.

## 2020-11-14 NOTE — Progress Notes (Signed)
Patient tolerated MMR vaccine and Pneumovax vaccine with no complaints voiced.  Sites clean and dry with no bruising or swelling noted at site.  Band aids applied.  VSS with discharge and left ambulatory with no s/s of distress noted.

## 2020-11-14 NOTE — Patient Instructions (Addendum)
[place] at [facility] Discharge Instructions  You were seen today by Dr. Delton Coombes. He went over your recent results. You will be scheduled to have an MRI scan of your back to check for any tumors or lesions. Dr. Delton Coombes will see you back after the scan for follow up.   Thank you for choosing [place] at [facility] to provide your oncology and hematology care.  To afford each patient quality time with our provider, please arrive at least 15 minutes before your scheduled appointment time.   If you have a lab appointment with the [place] please come in thru the Main Entrance and check in at the main information desk  You need to re-schedule your appointment should you arrive 10 or more minutes late.  We strive to give you quality time with our providers, and arriving late affects you and other patients whose appointments are after yours.  Also, if you no show three or more times for appointments you may be dismissed from the clinic at the providers discretion.     Again, thank you for choosing [facility].  Our hope is that these requests will decrease the amount of time that you wait before being seen by our physicians.       _____________________________________________________________  Should you have questions after your visit to [facility], please contact our office at [phone] between the hours of 8:00 a.m. and 4:30 p.m.  Voicemails left after 4:00 p.m. will not be returned until the following business day.  For prescription refill requests, have your pharmacy contact our office and allow 72 hours.    Cancer Center Support Programs:   > Cancer Support Group  2nd Tuesday of the month 1pm-2pm, Journey Room

## 2020-11-21 ENCOUNTER — Other Ambulatory Visit (HOSPITAL_COMMUNITY): Payer: Self-pay

## 2020-11-21 DIAGNOSIS — C9000 Multiple myeloma not having achieved remission: Secondary | ICD-10-CM

## 2020-11-21 DIAGNOSIS — G479 Sleep disorder, unspecified: Secondary | ICD-10-CM

## 2020-11-21 MED ORDER — THERA PO TABS: 1.0000 | ORAL_TABLET | Freq: Every day | ORAL | 5 refills | Status: DC

## 2020-11-21 MED ORDER — ZOLPIDEM TARTRATE 10 MG PO TABS: 10.0000 mg | ORAL_TABLET | Freq: Every evening | ORAL | 2 refills | Status: DC | PRN

## 2020-11-29 ENCOUNTER — Ambulatory Visit (HOSPITAL_COMMUNITY)
Admission: RE | Admit: 2020-11-29 | Discharge: 2020-11-29 | Disposition: A | Discharge status: Home / Self Care | Payer: Medicare Other | Source: Ambulatory Visit | Attending: Hematology | Admitting: Hematology

## 2020-11-29 DIAGNOSIS — C9001 Multiple myeloma in remission: Secondary | ICD-10-CM | POA: Insufficient documentation

## 2020-11-29 MED ORDER — GADOBUTROL 1 MMOL/ML IV SOLN
7.0000 mL | Freq: Once | INTRAVENOUS | Status: AC | PRN
  Administered 2020-11-29: 7 mL via INTRAVENOUS

## 2020-12-01 ENCOUNTER — Other Ambulatory Visit (HOSPITAL_COMMUNITY): Payer: Self-pay

## 2020-12-01 ENCOUNTER — Inpatient Hospital Stay (HOSPITAL_COMMUNITY): Payer: Medicare Other | Attending: Hematology | Admitting: Hematology

## 2020-12-01 ENCOUNTER — Other Ambulatory Visit: Payer: Self-pay

## 2020-12-01 VITALS — BP 150/93 | HR 83 | Temp 97.0°F | Resp 18 | Wt 170.7 lb

## 2020-12-01 DIAGNOSIS — G893 Neoplasm related pain (acute) (chronic): Secondary | ICD-10-CM | POA: Insufficient documentation

## 2020-12-01 DIAGNOSIS — C9001 Multiple myeloma in remission: Secondary | ICD-10-CM | POA: Diagnosis not present

## 2020-12-01 DIAGNOSIS — Z79899 Other long term (current) drug therapy: Secondary | ICD-10-CM | POA: Insufficient documentation

## 2020-12-01 DIAGNOSIS — Z7984 Long term (current) use of oral hypoglycemic drugs: Secondary | ICD-10-CM | POA: Insufficient documentation

## 2020-12-01 DIAGNOSIS — C9000 Multiple myeloma not having achieved remission: Secondary | ICD-10-CM | POA: Insufficient documentation

## 2020-12-01 DIAGNOSIS — Z9221 Personal history of antineoplastic chemotherapy: Secondary | ICD-10-CM | POA: Diagnosis not present

## 2020-12-01 DIAGNOSIS — F32A Depression, unspecified: Secondary | ICD-10-CM | POA: Insufficient documentation

## 2020-12-01 DIAGNOSIS — F419 Anxiety disorder, unspecified: Secondary | ICD-10-CM | POA: Insufficient documentation

## 2020-12-01 DIAGNOSIS — Z86711 Personal history of pulmonary embolism: Secondary | ICD-10-CM | POA: Insufficient documentation

## 2020-12-01 DIAGNOSIS — R1013 Epigastric pain: Secondary | ICD-10-CM | POA: Insufficient documentation

## 2020-12-01 DIAGNOSIS — Z9484 Stem cells transplant status: Secondary | ICD-10-CM | POA: Diagnosis not present

## 2020-12-01 DIAGNOSIS — Z7901 Long term (current) use of anticoagulants: Secondary | ICD-10-CM | POA: Insufficient documentation

## 2020-12-01 DIAGNOSIS — I10 Essential (primary) hypertension: Secondary | ICD-10-CM | POA: Diagnosis not present

## 2020-12-01 MED ORDER — HYDROCODONE-ACETAMINOPHEN 7.5-325 MG PO TABS: 1.0000 | ORAL_TABLET | Freq: Four times a day (QID) | ORAL | 0 refills | Status: DC | PRN

## 2020-12-01 NOTE — Patient Instructions (Signed)
[place] at [facility] Discharge Instructions  You were seen today by Dr. Delton Coombes. He went over your recent results and scans. You will be referred to Dr. Lisbeth Renshaw in [place] for your sternal bone lesion that was visible on MRI. Dr. Delton Coombes will see you back in 2 months for labs and follow up.   Thank you for choosing [place] at [facility] to provide your oncology and hematology care.  To afford each patient quality time with our provider, please arrive at least 15 minutes before your scheduled appointment time.   If you have a lab appointment with the [facility] please come in thru the Main Entrance and check in at the main information desk  You need to re-schedule your appointment should you arrive 10 or more minutes late.  We strive to give you quality time with our providers, and arriving late affects you and other patients whose appointments are after yours.  Also, if you no show three or more times for appointments you may be dismissed from the clinic at the providers discretion.     Again, thank you for choosing [facility].  Our hope is that these requests will decrease the amount of time that you wait before being seen by our physicians.       _____________________________________________________________  Should you have questions after your visit to [facility], please contact our office at [phone] between the hours of 8:00 a.m. and 4:30 p.m.  Voicemails left after 4:00 p.m. will not be returned until the following business day.  For prescription refill requests, have your pharmacy contact our office and allow 72 hours.    Cancer Center Support Programs:   > Cancer Support Group  2nd Tuesday of the month 1pm-2pm, Journey Room

## 2020-12-01 NOTE — Progress Notes (Signed)
[address], [place] [id number]   CLINIC:  Medical Oncology/Hematology  PCP:  [facility] [address] [place] [address]  [phone]  REASON FOR VISIT:  Follow-up for multiple myeloma  PRIOR THERAPY:  1. KPD x 4 cycles from 04/17/2018 to 08/21/2018. 2. Stem cell transplant on 10/23/2018. 3. Radiation to left and right humerus 25 Gy in 10 fractions from 08/23/2020 to 08/25/2020.  CURRENT THERAPY: Maintenance Pomalyst 3/4 weeks  INTERVAL HISTORY:  Alexandra Price, a 75 y.o. female, returns for routine follow-up for her multiple myeloma. Alexandra Price was last seen on 11/14/2020.  Today she reports feeling okay. She continues having mid-lower thoracic back ache and occasional left shoulder pain which radiates into her neck; she denies pain radiating into her legs. She takes Norco every 6 hours and ibuprofen PRN which helps control the pain. She also reports having occasional epigastric stabbing abdominal pain.   REVIEW OF SYSTEMS:  Review of Systems  Constitutional: Positive for fatigue (25%). Negative for appetite change.  Gastrointestinal: Positive for abdominal pain (occasional epigastric stabbing pain).  Musculoskeletal: Positive for arthralgias (shoulders pain) and back pain (5/10 back pain).  All other systems reviewed and are negative.   PAST MEDICAL/SURGICAL HISTORY:  Past Medical History:  Diagnosis Date  . Anxiety   . Depression   . Hypertension   . Multiple myeloma ([place])    multiple myeloma   Past Surgical History:  Procedure Laterality Date  . ABDOMINAL HYSTERECTOMY     total  . APPENDECTOMY    . LAPAROSCOPIC APPENDECTOMY N/A 05/20/2018   Procedure: APPENDECTOMY LAPAROSCOPIC;  Surgeon: [name], MD;  Location: [facility];  Service: General;  Laterality: N/A;  . PORTACATH PLACEMENT Right 04/14/2018   Procedure: INSERTION PORT-A-CATH;  Surgeon: [name], MD;  Location: [facility];  Service:  General;  Laterality: Right;    SOCIAL HISTORY:  Social History   Socioeconomic History  . Marital status: Legally Separated    Spouse name: Not on file  . Number of children: 6  . Years of education: Not on file  . Highest education level: Not on file  Occupational History    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  Tobacco Use  . Smoking status: Never Smoker  . Smokeless tobacco: Never Used  Vaping Use  . Vaping Use: Never used  Substance and Sexual Activity  . Alcohol use: Never  . Drug use: Not Currently  . Sexual activity: Not Currently  Other Topics Concern  . Not on file  Social History Narrative  . Not on file   Social Determinants of Health   Financial Resource Strain: Low Risk   . Difficulty of Paying Living Expenses: Not hard at all  Food Insecurity: No Food Insecurity  . Worried About Charity fundraiser in the Last Year: Never true  . Ran Out of Food in the Last Year: Never true  Transportation Needs: No Transportation Needs  . Lack of Transportation (Medical): No  . Lack of Transportation (Non-Medical): No  Physical Activity: Inactive  . Days of Exercise per Week: 0 days  . Minutes of Exercise per Session: 0 min  Stress: No Stress Concern Present  . Feeling of Stress : Not at all  Social Connections: Moderately Isolated  . Frequency of Communication with Friends and Family: More than three times a week  . Frequency of Social Gatherings with Friends and Family: Twice a week  . Attends Religious Services: 1  to 4 times per year  . Active Member of Clubs or Organizations: No  . Attends Archivist Meetings: Never  . Marital Status: Separated  Intimate Partner Violence: Not At Risk  . Fear of Current or Ex-Partner: No  . Emotionally Abused: No  . Physically Abused: No  . Sexually Abused: No    FAMILY HISTORY:  Family History  Problem Relation Age of Onset  . Heart disease Mother   . Emphysema Father   . Diabetes Sister   . Depression Sister    . Cancer Brother        liver, lung, and colon  . Diabetes Brother     CURRENT MEDICATIONS:  Current Outpatient Medications  Medication Sig Dispense Refill  . acyclovir (ZOVIRAX) 400 MG tablet Take 1 tablet (400 mg total) by mouth 2 (two) times daily. 60 tablet 4  . albuterol (VENTOLIN HFA) 108 (90 Base) MCG/ACT inhaler Inhale 2 puffs into the lungs in the morning, at noon, in the evening, and at bedtime. 8 g 0  . ALPRAZolam (XANAX) 0.5 MG tablet Take one tablet during the day as needed for anxiety and two tablets at bedtime as needed for anxiety/insomnia 90 tablet 3  . benzonatate (TESSALON) 100 MG capsule Take 1 capsule (100 mg total) by mouth 3 (three) times daily. 20 capsule 0  . Calcium Carb-Cholecalciferol (CALCIUM 1000 + D PO) Take 1,000 mg by mouth daily. Take one tablet daily until next visit with oncologist.    . citalopram (CELEXA) 40 MG tablet Take 1 tablet (40 mg total) by mouth daily. 90 tablet 3  . ELIQUIS 5 MG TABS tablet Take 5 mg by mouth 2 (two) times daily.    Marland Kitchen glipiZIDE (GLUCOTROL XL) 5 MG 24 hr tablet Take 1 tablet by mouth once daily with breakfast 90 tablet 2  . HYDROcodone-acetaminophen (NORCO) 7.5-325 MG tablet Take 1 tablet by mouth every 6 (six) hours as needed for moderate pain. 120 tablet 0  . loperamide (IMODIUM) 2 MG capsule Take 1 capsule (2 mg total) by mouth as needed for diarrhea or loose stools. 30 capsule 0  . Multiple Vitamin (THERA) TABS Take 1 tablet by mouth daily. 30 tablet 5  . ondansetron (ZOFRAN) 4 MG tablet Take 1 tablet (4 mg total) by mouth every 8 (eight) hours as needed for nausea or vomiting. 20 tablet 1  . pantoprazole (PROTONIX) 40 MG tablet Take 1 tablet by mouth once daily 90 tablet 0  . POMALYST 2 MG capsule TAKE 1 CAPSULE BY MOUTH ONCE DAILY 21 capsule 1  . rosuvastatin (CRESTOR) 10 MG tablet Take 10 mg by mouth daily.    Marland Kitchen saccharomyces boulardii (FLORASTOR) 250 MG capsule Take 1 capsule (250 mg total) by mouth 2 (two) times daily.  20 capsule 0  . terbinafine (LAMISIL) 1 % cream Apply 1 application topically 2 (two) times daily. 30 g 0  . zolpidem (AMBIEN) 10 MG tablet Take 1 tablet (10 mg total) by mouth at bedtime as needed for sleep. 30 tablet 2   No current facility-administered medications for this visit.    ALLERGIES:  Allergies  Allergen Reactions  . Ciprofloxacin Anaphylaxis  . Amoxicillin   . Morphine   . Morphine And Related Nausea And Vomiting  . Augmentin [Amoxicillin-Pot Clavulanate] Other (See Comments)    Headache, insomnia    PHYSICAL EXAM:  Performance status (ECOG): 1 - Symptomatic but completely ambulatory  Vitals:   12/01/20 1012 12/01/20 1039  BP: 140/66 (!) 150/93  Pulse: (!) 57 83  Resp: 18 18  Temp: (!) 96.9 F (36.1 C) (!) 97 F (36.1 C)  SpO2: 95% 98%   Wt Readings from Last 3 Encounters:  12/01/20 170 lb 11.2 oz (77.4 kg)  11/14/20 174 lb 9.6 oz (79.2 kg)  10/13/20 175 lb (79.4 kg)   Physical Exam Vitals reviewed.  Constitutional:      Appearance: Normal appearance.  Chest:     Chest wall: No tenderness.  Neurological:     General: No focal deficit present.     Mental Status: She is alert and oriented to person, place, and time.  Psychiatric:        Mood and Affect: Mood normal.        Behavior: Behavior normal.     LABORATORY DATA:  I have reviewed the labs as listed.  CBC Latest Ref Rng & Units 11/07/2020 10/16/2020 10/15/2020  WBC 4.0 - 10.5 K/uL 2.7(L) 2.5(L) 2.6(L)  Hemoglobin 12.0 - 15.0 g/dL 9.4(L) 8.3(L) 9.1(L)  Hematocrit 36.0 - 46.0 % 29.3(L) 26.5(L) 28.9(L)  Platelets 150 - 400 K/uL 79(L) 122(L) 112(L)   CMP Latest Ref Rng & Units 11/07/2020 10/16/2020 10/15/2020  Glucose 70 - 99 mg/dL 101(H) 114(H) 156(H)  BUN 8 - 23 mg/dL 24(H) 25(H) 21  Creatinine 0.44 - 1.00 mg/dL 1.60(H) 1.64(H) 1.51(H)  Sodium 135 - 145 mmol/L 140 139 136  Potassium 3.5 - 5.1 mmol/L 4.1 4.9 4.5  Chloride 98 - 111 mmol/L 105 105 106  CO2 22 - 32 mmol/L 27 26 24   Calcium 8.9  - 10.3 mg/dL 8.9 9.0 8.6(L)  Total Protein 6.5 - 8.1 g/dL 6.6 6.4(L) 7.1  Total Bilirubin 0.3 - 1.2 mg/dL 0.5 0.5 0.4  Alkaline Phos 38 - 126 U/L 41 35(L) 40  AST 15 - 41 U/L 17 16 21   ALT 0 - 44 U/L 13 15 17       Component Value Date/Time   RBC 2.73 (L) 11/07/2020 1324   MCV 107.3 (H) 11/07/2020 1324   MCH 34.4 (H) 11/07/2020 1324   MCHC 32.1 11/07/2020 1324   RDW 13.3 11/07/2020 1324   LYMPHSABS 0.8 11/07/2020 1324   MONOABS 0.5 11/07/2020 1324   EOSABS 0.3 11/07/2020 1324   BASOSABS 0.0 11/07/2020 1324    DIAGNOSTIC IMAGING:  I have independently reviewed the scans and discussed with the patient. MR Thoracic Spine W Wo Contrast  Result Date: 11/29/2020 CLINICAL DATA:  75 year old female with multiple myeloma. Persistent mid back pain. No known injury. EXAM: MRI THORACIC WITHOUT AND WITH CONTRAST TECHNIQUE: Multiplanar and multiecho pulse sequences of the thoracic spine were obtained without and with intravenous contrast. CONTRAST:  61m GADAVIST GADOBUTROL 1 MMOL/ML IV SOLN COMPARISON:  CTA chest  10/13/2020. FINDINGS: Limited cervical spine imaging: Visible thoracic bone marrow signal appears within normal limits. Thoracic spine segmentation: Normal allowing for hypoplastic or absent ribs at T12. Alignment: Exaggerated thoracic kyphosis again noted. No spondylolisthesis. Vertebrae: Chronic T11 superior endplate compression fracture is stable since 2020. Thoracic vertebral height maintained elsewhere. Despite the abnormally heterogeneous thoracic spine bone mineralization by CT in February, thoracic marrow signal appears normal on these images. No suspicious thoracic vertebral body enhancement. Visible posterior ribs also appear within normal limits. Visible marrow signal in the upper lumbar vertebrae is more heterogeneous, but only associated with faint marrow edema and enhancement at the inferior L1 endplate (series 25, image 9) which is probably degenerative. However, there is more  confluent and suspicious appearing abnormally decreased T1 marrow signal,  T2 heterogeneity, STIR hyperintensity and heterogeneous enhancement of the superior right sternum (series 19, image 8). Medial clavicles appear to remain normal. Cord: Normal. Capacious thoracic spinal canal. Normal conus medullaris at T12-L1. No abnormal intradural enhancement. No dural thickening. Paraspinal and other soft tissues: Stable visible chest and upper abdominal viscera. Thoracic paraspinal soft tissues are within normal limits. Disc levels: T1-T2: Negative. T2-T3: Negative. T3-T4: Negative. T4-T5: Negative. T5-T6: Negative. T6-T7: Negative. T7-T8: Negative. T8-T9: Negative. T9-T10: Negative. T10-T11: Mild chronic retropulsion of the T11 posterosuperior endplate. Mild associated disc bulging. Narrows ventral CSF space but no significant spinal stenosis. Mild left T10 neural foraminal stenosis. T11-T12: Negative. T12-L1: Mild disc bulging and subtle right cephalad disc extrusion best seen on series 20, image 8. Borderline to mild right lateral recess and right foraminal stenosis both at the right T12 nerve level. No spinal or left foraminal involvement. A greater degree of lumbar disc bulging is noted, but there is only mild visible lumbar spinal stenosis. IMPRESSION: 1. Roughly 18 mm marrow lesion with enhancement located in the right superior sternum is suspicious for active multiple myeloma. 2. But no active myeloma or acute osseous abnormality in the thoracic spine. Chronic T11 compression fracture is stable since 2020 resulting in mild left T10 neural foraminal stenosis. 3. Small rightward disc herniation at T12-L1 with mild stenosis at the right T12 nerve level. No other significant thoracic spine degeneration. And Capacious thoracic spinal canal without spinal stenosis. 4. Heterogeneous but probably degenerative marrow signal in the visible upper lumbar levels. Partially visible lumbar spine degeneration and mild spinal  stenosis. Electronically Signed   By: Genevie Ann M.D.   On: 11/29/2020 11:21     ASSESSMENT:  1. IgG lambda plasma cell myeloma, stage II, standard risk: -4 cycles of KPD from 04/17/2018 through 08/21/2018, stem cell transplant on 10/23/2018. -PET scan on 01/21/2019 showed multiple bone lesions but without any hypermetabolic activity. -BMBX on 01/21/2019 with normocellular marrow with no increase in plasma cells. Normal FISH. MRD results negative. -Maintenance pomalidomide 2 mg 3 weeks on/1 week off started on 03/10/2019. -Bone marrow biopsy on 10/29/2019 shows trilineage hematopoiesis with no evidence of plasma cells. Chromosome analysis and FISH are normal. -BM BX on 10/27/2020 at [facility] with 30 to 40%, TLH.  2% of the total cells are CD 138+ plasma cells.  2. Pulmonary embolism: -CT angiogram on 04/21/2020 showed filling defect at [facility]. -VQ scan confirmed pulmonary embolism. -She is on Eliquis.   PLAN:  1. IgG lambda plasma cell myeloma: -Bone marrow biopsy on 10/27/2020 showed normocellular marrow for age with 28 to 40% with TLH.  2% plasma cells positive for CD138. - Continue Pomalyst 2 mg 3 weeks on/1 week off. - Reviewed myeloma labs from 11/07/2020 which showed M spike not observed.  Immunofixation was normal.  Kappa light chains elevated at 35 with normal ratio.  Creatinine is stable at 1.6 from CKD. - Recommend continuing Pomalyst, same dose. - RTC 8 weeks for follow-up.  I plan to repeat myeloma labs 1 week prior.  2. Back pain: -We reviewed MRI of the thoracic spine results.  No spine lesions.  However new sternal lesion for which she will be referred to radiation.  3. Myeloma bone disease: -Xgeva on hold due to exposure of bone in the right lower jaw. - Continue calcium and vitamin D supplements.  4. Anxiety: -Continue Xanax twice daily as needed.  5. Sleeping difficulty: -Continue Ambien 10 mg at bedtime as needed.  6.  Normocytic anemia: -Hemoglobin is 9.4.  Combination anemia from CKD, myelosuppression and pomalidomide.  We will check for nutritional deficiencies.  7.Pulmonary embolism: -Continue Eliquis.  No bleeding issues.  8. Right shoulder and upper arm pain: -She had radiation to the shoulder before. - Reviewed MRI of the thoracic spine from 11/29/2020 which showed 18 mm marrow lesion with enhancement located in the right superior sternum suspicious for myeloma.  No active myeloma in the thoracic spine.  Chronic T11 compression fracture is stable resulting in mild left T10 foraminal stenosis. - We will refer her to Dr. Lisbeth Renshaw for consideration of radiation. - Continue hydrocodone every 6 hours as needed.   Orders placed this encounter:  No orders of the defined types were placed in this encounter.    [name], MD [place] [phone]   I, Milinda Antis, am acting as a scribe for Dr. Sanda Linger.  I, [name] MD, have reviewed the above documentation for accuracy and completeness, and I agree with the above.

## 2020-12-05 ENCOUNTER — Other Ambulatory Visit (HOSPITAL_COMMUNITY): Payer: Self-pay | Admitting: Hematology

## 2020-12-05 DIAGNOSIS — C9000 Multiple myeloma not having achieved remission: Secondary | ICD-10-CM

## 2020-12-06 NOTE — Telephone Encounter (Signed)
Chart reviewed. Pomalyst refilled per Dr. Delton Coombes

## 2020-12-08 ENCOUNTER — Ambulatory Visit
Admission: RE | Admit: 2020-12-08 | Discharge: 2020-12-08 | Disposition: A | Discharge status: Home / Self Care | Payer: Medicare Other | Source: Ambulatory Visit | Attending: Radiation Oncology | Admitting: Radiation Oncology

## 2020-12-08 ENCOUNTER — Encounter: Payer: Self-pay | Admitting: Radiation Oncology

## 2020-12-08 ENCOUNTER — Other Ambulatory Visit: Payer: Self-pay

## 2020-12-08 DIAGNOSIS — C9000 Multiple myeloma not having achieved remission: Secondary | ICD-10-CM

## 2020-12-08 DIAGNOSIS — C9001 Multiple myeloma in remission: Secondary | ICD-10-CM

## 2020-12-08 DIAGNOSIS — M545 Low back pain, unspecified: Secondary | ICD-10-CM

## 2020-12-08 NOTE — Progress Notes (Signed)
Radiation Oncology         [phone] ________________________________  Outpatient Reconsultation - Conducted via telephone due to current COVID-19 concerns for limiting patient exposure  I spoke with the patient to conduct this consult visit via telephone to spare the patient unnecessary potential exposure in the healthcare setting during the current COVID-19 pandemic. The patient was notified in advance and was offered a [facility] meeting to allow for face to face communication but unfortunately reported that they did not have the appropriate resources/technology to support such a visit and instead preferred to proceed with a telephone visit.    Name: Alexandra Price        MRN: [id number]  Date of Service: 12/08/2020 DOB: June 10, 1946  CC:[facility] [place]  [name], MD     REFERRING PHYSICIAN: [name], MD   DIAGNOSIS: The primary encounter diagnosis was Multiple myeloma in remission [facility]). Diagnoses of Multiple myeloma without remission ([place]) and Acute bilateral low back pain without sciatica were also pertinent to this visit.   HISTORY OF PRESENT ILLNESS: Alexandra Price is a 75 y.o. female seen at the request of Dr. Delton Coombes for a history of multiple myeloma, the patient was originally diagnosed in the summer 2019, and received induction chemotherapy followed by stem cell transplant on 10/23/2018 at [facility].  She received palliative radiotherapy to bilateral shoulders for progressive disease which she completed in January of this year. She has noted progressive pain in her mid to low back and an MRI of the T spine on 11/29/20 which revealed a lesion in the right aspect of the sternum measuring 18 mm in the marrow suspicious for active myeloma no active myeloma was seen elsewhere throughout the spine, she has a chronic T11 compression fracture stable since 2020 resulting in left T10 neural foraminal stenosis and slight disc herniation at T12-L1 and mild  stenosis at T12 nerve root.  She is seen today to consider radiotherapy to the sternum. Her systemic therapy is oral Pomalyst.   PREVIOUS RADIATION THERAPY:   08/09/20 - 08/25/20: 1.  The right shoulder was treated to a dose of 25 Gray in 10 fractions using a 2 field isodose plan. 2.  The left shoulder was treated to a dose of 25 Gray in 10 fractions using a 2 field isodose plan   PAST MEDICAL HISTORY:  Past Medical History:  Diagnosis Date  . Anxiety   . Depression   . Hypertension   . Multiple myeloma ([place])    multiple myeloma       PAST SURGICAL HISTORY: Past Surgical History:  Procedure Laterality Date  . ABDOMINAL HYSTERECTOMY     total  . APPENDECTOMY    . LAPAROSCOPIC APPENDECTOMY N/A 05/20/2018   Procedure: APPENDECTOMY LAPAROSCOPIC;  Surgeon: [name], MD;  Location: [facility];  Service: General;  Laterality: N/A;  . PORTACATH PLACEMENT Right 04/14/2018   Procedure: INSERTION PORT-A-CATH;  Surgeon: [name], MD;  Location: [facility];  Service: General;  Laterality: Right;     FAMILY HISTORY:  Family History  Problem Relation Age of Onset  . Heart disease Mother   . Emphysema Father   . Diabetes Sister   . Depression Sister   . Cancer Brother        liver, lung, and colon  . Diabetes Brother      SOCIAL HISTORY:  reports that she has never smoked. She has never used smokeless tobacco. She reports previous drug use. She reports that she does  not drink alcohol. The patient is separated and lives in [place] in [place], [place].    ALLERGIES: Ciprofloxacin, Amoxicillin, Morphine, Morphine and related, and Augmentin [amoxicillin-pot clavulanate]   MEDICATIONS:  Current Outpatient Medications  Medication Sig Dispense Refill  . acyclovir (ZOVIRAX) 400 MG tablet Take 1 tablet (400 mg total) by mouth 2 (two) times daily. 60 tablet 4  . ALPRAZolam (XANAX) 0.5 MG tablet Take one tablet during the day as needed for anxiety and two tablets at bedtime as needed  for anxiety/insomnia 90 tablet 3  . Calcium Carb-Cholecalciferol (CALCIUM 1000 + D PO) Take 1,000 mg by mouth daily. Take one tablet daily until next visit with oncologist.    . citalopram (CELEXA) 40 MG tablet Take 1 tablet (40 mg total) by mouth daily. 90 tablet 3  . ELIQUIS 5 MG TABS tablet Take 5 mg by mouth 2 (two) times daily.    Marland Kitchen glipiZIDE (GLUCOTROL XL) 5 MG 24 hr tablet Take 1 tablet by mouth once daily with breakfast 90 tablet 2  . HYDROcodone-acetaminophen (NORCO) 7.5-325 MG tablet Take 1 tablet by mouth every 6 (six) hours as needed for moderate pain. 120 tablet 0  . Multiple Vitamin (THERA) TABS Take 1 tablet by mouth daily. 30 tablet 5  . pantoprazole (PROTONIX) 40 MG tablet Take 1 tablet by mouth once daily 90 tablet 0  . POMALYST 2 MG capsule TAKE 1 CAPSULE BY MOUTH ONCE DAILY 21 capsule 1  . rosuvastatin (CRESTOR) 10 MG tablet Take 10 mg by mouth daily.    Marland Kitchen terbinafine (LAMISIL) 1 % cream Apply 1 application topically 2 (two) times daily. 30 g 0  . zolpidem (AMBIEN) 10 MG tablet Take 1 tablet (10 mg total) by mouth at bedtime as needed for sleep. 30 tablet 2  . albuterol (VENTOLIN HFA) 108 (90 Base) MCG/ACT inhaler Inhale 2 puffs into the lungs in the morning, at noon, in the evening, and at bedtime. (Patient not taking: Reported on 12/08/2020) 8 g 0  . benzonatate (TESSALON) 100 MG capsule Take 1 capsule (100 mg total) by mouth 3 (three) times daily. (Patient not taking: Reported on 12/08/2020) 20 capsule 0  . loperamide (IMODIUM) 2 MG capsule Take 1 capsule (2 mg total) by mouth as needed for diarrhea or loose stools. (Patient not taking: Reported on 12/08/2020) 30 capsule 0  . ondansetron (ZOFRAN) 4 MG tablet Take 1 tablet (4 mg total) by mouth every 8 (eight) hours as needed for nausea or vomiting. (Patient not taking: Reported on 12/08/2020) 20 tablet 1  . saccharomyces boulardii (FLORASTOR) 250 MG capsule Take 1 capsule (250 mg total) by mouth 2 (two) times daily. (Patient not  taking: Reported on 12/08/2020) 20 capsule 0   No current facility-administered medications for this encounter.     REVIEW OF SYSTEMS: On review of systems, the patient reports that she is doing okay overall.  Her main complaint of pain is in her mid back below the level of the bra line and is deep and achying, sometimes shooting in nature. She reports pain medication does help somewhat. She is also having complaints of pain in her sternal region and feels like she has been hit in the chest there.     PHYSICAL EXAM:  Unable to assess due to encounter type.     ECOG = 1  0 - Asymptomatic (Fully active, able to carry on all predisease activities without restriction)  1 - Symptomatic but completely ambulatory (Restricted in physically strenuous activity but  ambulatory and able to carry out work of a light or sedentary nature. For example, light housework, office work)  2 - Symptomatic, <50% in bed during the day (Ambulatory and capable of all self care but unable to carry out any work activities. Up and about more than 50% of waking hours)  3 - Symptomatic, >50% in bed, but not bedbound (Capable of only limited self-care, confined to bed or chair 50% or more of waking hours)  4 - Bedbound (Completely disabled. Cannot carry on any self-care. Totally confined to bed or chair)  5 - Death   Eustace Pen MM, Creech RH, Tormey DC, et al. [phone]). "Toxicity and response criteria of the [facility]". [place] Oncol. 5 (6): 649-55    LABORATORY DATA:  Lab Results  Component Value Date   WBC 2.7 (L) 11/07/2020   HGB 9.4 (L) 11/07/2020   HCT 29.3 (L) 11/07/2020   MCV 107.3 (H) 11/07/2020   PLT 79 (L) 11/07/2020   Lab Results  Component Value Date   NA 140 11/07/2020   K 4.1 11/07/2020   CL 105 11/07/2020   CO2 27 11/07/2020   Lab Results  Component Value Date   ALT 13 11/07/2020   AST 17 11/07/2020   ALKPHOS 41 11/07/2020   BILITOT 0.5 11/07/2020       RADIOGRAPHY: MR Thoracic Spine W Wo Contrast  Result Date: 11/29/2020 CLINICAL DATA:  75 year old female with multiple myeloma. Persistent mid back pain. No known injury. EXAM: MRI THORACIC WITHOUT AND WITH CONTRAST TECHNIQUE: Multiplanar and multiecho pulse sequences of the thoracic spine were obtained without and with intravenous contrast. CONTRAST:  65m GADAVIST GADOBUTROL 1 MMOL/ML IV SOLN COMPARISON:  CTA chest  10/13/2020. FINDINGS: Limited cervical spine imaging: Visible thoracic bone marrow signal appears within normal limits. Thoracic spine segmentation: Normal allowing for hypoplastic or absent ribs at T12. Alignment: Exaggerated thoracic kyphosis again noted. No spondylolisthesis. Vertebrae: Chronic T11 superior endplate compression fracture is stable since 2020. Thoracic vertebral height maintained elsewhere. Despite the abnormally heterogeneous thoracic spine bone mineralization by CT in February, thoracic marrow signal appears normal on these images. No suspicious thoracic vertebral body enhancement. Visible posterior ribs also appear within normal limits. Visible marrow signal in the upper lumbar vertebrae is more heterogeneous, but only associated with faint marrow edema and enhancement at the inferior L1 endplate (series 25, image 9) which is probably degenerative. However, there is more confluent and suspicious appearing abnormally decreased T1 marrow signal, T2 heterogeneity, STIR hyperintensity and heterogeneous enhancement of the superior right sternum (series 19, image 8). Medial clavicles appear to remain normal. Cord: Normal. Capacious thoracic spinal canal. Normal conus medullaris at T12-L1. No abnormal intradural enhancement. No dural thickening. Paraspinal and other soft tissues: Stable visible chest and upper abdominal viscera. Thoracic paraspinal soft tissues are within normal limits. Disc levels: T1-T2: Negative. T2-T3: Negative. T3-T4: Negative. T4-T5: Negative. T5-T6: Negative.  T6-T7: Negative. T7-T8: Negative. T8-T9: Negative. T9-T10: Negative. T10-T11: Mild chronic retropulsion of the T11 posterosuperior endplate. Mild associated disc bulging. Narrows ventral CSF space but no significant spinal stenosis. Mild left T10 neural foraminal stenosis. T11-T12: Negative. T12-L1: Mild disc bulging and subtle right cephalad disc extrusion best seen on series 20, image 8. Borderline to mild right lateral recess and right foraminal stenosis both at the right T12 nerve level. No spinal or left foraminal involvement. A greater degree of lumbar disc bulging is noted, but there is only mild visible lumbar spinal stenosis. IMPRESSION: 1. Roughly 18  mm marrow lesion with enhancement located in the right superior sternum is suspicious for active multiple myeloma. 2. But no active myeloma or acute osseous abnormality in the thoracic spine. Chronic T11 compression fracture is stable since 2020 resulting in mild left T10 neural foraminal stenosis. 3. Small rightward disc herniation at T12-L1 with mild stenosis at the right T12 nerve level. No other significant thoracic spine degeneration. And Capacious thoracic spinal canal without spinal stenosis. 4. Heterogeneous but probably degenerative marrow signal in the visible upper lumbar levels. Partially visible lumbar spine degeneration and mild spinal stenosis. Electronically Signed   By: Genevie Ann M.D.   On: 11/29/2020 11:21       IMPRESSION/PLAN: 1. Multiple Myeloma not having achieved remission.Dr. Lisbeth Renshaw discusses the imaging findings and reviews the nature of myelomatous disease in the sternum and the role of palliative radiotherapy to the sternum. He does not see any evidence of disease in the spine however. Given her symptoms of pain in the sternal region she is interested in a course of radiotherapy. We discussed the risks, benefits, short, and long term effects of radiotherapy, a well as the palliative intent, and the patient is interested in  proceeding. Dr. Lisbeth Renshaw discusses the delivery and logistics of radiotherapy and anticipates a course of 2 weeks of radiotherapy. She will come on 12/19/20 for simulation at which time she will sign written consent to proceed. 2. Mid-low back pain. I discussed her case with IR and they do not feel that vertebral augmentation would be helpful as she does not have edema of the site of compression deformity. Rather, we discussed evaluation with neurosurgery to see if there are any procedures she would benefit from. She is in agreement with this plan.  In a visit lasting 60 minutes, greater than 50% of the time was spent face to face discussing the patient's condition, in preparation for the discussion, and coordinating the patient's care.   The above documentation reflects my direct findings during this shared patient visit. Please see the separate note by Dr. Lisbeth Renshaw on this date for the remainder of the patient's plan of care.    Carola Rhine, PAC

## 2020-12-16 ENCOUNTER — Other Ambulatory Visit (HOSPITAL_COMMUNITY): Payer: Self-pay

## 2020-12-16 DIAGNOSIS — C9000 Multiple myeloma not having achieved remission: Secondary | ICD-10-CM

## 2020-12-16 DIAGNOSIS — F419 Anxiety disorder, unspecified: Secondary | ICD-10-CM

## 2020-12-16 MED ORDER — ALPRAZOLAM 0.5 MG PO TABS: ORAL_TABLET | ORAL | 3 refills | Status: DC

## 2020-12-19 ENCOUNTER — Ambulatory Visit: Payer: Medicare Other | Admitting: Radiation Oncology

## 2020-12-19 ENCOUNTER — Other Ambulatory Visit (HOSPITAL_COMMUNITY): Payer: Self-pay

## 2020-12-20 ENCOUNTER — Ambulatory Visit
Admission: RE | Admit: 2020-12-20 | Discharge: 2020-12-20 | Disposition: A | Discharge status: Home / Self Care | Payer: Medicare Other | Source: Ambulatory Visit | Attending: Radiation Oncology | Admitting: Radiation Oncology

## 2020-12-20 ENCOUNTER — Other Ambulatory Visit: Payer: Self-pay

## 2020-12-20 DIAGNOSIS — C9000 Multiple myeloma not having achieved remission: Secondary | ICD-10-CM | POA: Insufficient documentation

## 2020-12-20 DIAGNOSIS — Z510 Encounter for antineoplastic radiation therapy: Secondary | ICD-10-CM | POA: Diagnosis not present

## 2020-12-21 DIAGNOSIS — Z510 Encounter for antineoplastic radiation therapy: Secondary | ICD-10-CM | POA: Diagnosis not present

## 2020-12-26 ENCOUNTER — Other Ambulatory Visit: Payer: Self-pay

## 2020-12-26 ENCOUNTER — Ambulatory Visit
Admission: RE | Admit: 2020-12-26 | Discharge: 2020-12-26 | Disposition: A | Discharge status: Home / Self Care | Payer: Medicare Other | Source: Ambulatory Visit | Attending: Radiation Oncology | Admitting: Radiation Oncology

## 2020-12-26 DIAGNOSIS — Z510 Encounter for antineoplastic radiation therapy: Secondary | ICD-10-CM | POA: Diagnosis not present

## 2020-12-27 ENCOUNTER — Ambulatory Visit
Admission: RE | Admit: 2020-12-27 | Discharge: 2020-12-27 | Disposition: A | Discharge status: Home / Self Care | Payer: Medicare Other | Source: Ambulatory Visit | Attending: Radiation Oncology | Admitting: Radiation Oncology

## 2020-12-27 DIAGNOSIS — Z510 Encounter for antineoplastic radiation therapy: Secondary | ICD-10-CM | POA: Diagnosis not present

## 2020-12-28 ENCOUNTER — Other Ambulatory Visit: Payer: Self-pay

## 2020-12-28 ENCOUNTER — Ambulatory Visit
Admission: RE | Admit: 2020-12-28 | Discharge: 2020-12-28 | Disposition: A | Discharge status: Home / Self Care | Payer: Medicare Other | Source: Ambulatory Visit | Attending: Radiation Oncology | Admitting: Radiation Oncology

## 2020-12-28 DIAGNOSIS — Z510 Encounter for antineoplastic radiation therapy: Secondary | ICD-10-CM | POA: Diagnosis not present

## 2020-12-29 ENCOUNTER — Ambulatory Visit
Admission: RE | Admit: 2020-12-29 | Discharge: 2020-12-29 | Disposition: A | Discharge status: Home / Self Care | Payer: Medicare Other | Source: Ambulatory Visit | Attending: Radiation Oncology | Admitting: Radiation Oncology

## 2020-12-29 ENCOUNTER — Other Ambulatory Visit (HOSPITAL_COMMUNITY): Payer: Self-pay

## 2020-12-29 DIAGNOSIS — Z510 Encounter for antineoplastic radiation therapy: Secondary | ICD-10-CM | POA: Diagnosis not present

## 2020-12-29 MED ORDER — HYDROCODONE-ACETAMINOPHEN 7.5-325 MG PO TABS: 1.0000 | ORAL_TABLET | Freq: Four times a day (QID) | ORAL | 0 refills | Status: DC | PRN

## 2020-12-30 ENCOUNTER — Other Ambulatory Visit: Payer: Self-pay

## 2020-12-30 ENCOUNTER — Ambulatory Visit
Admission: RE | Admit: 2020-12-30 | Discharge: 2020-12-30 | Disposition: A | Discharge status: Home / Self Care | Payer: Medicare Other | Source: Ambulatory Visit | Attending: Radiation Oncology | Admitting: Radiation Oncology

## 2020-12-30 DIAGNOSIS — Z510 Encounter for antineoplastic radiation therapy: Secondary | ICD-10-CM | POA: Diagnosis not present

## 2021-01-02 ENCOUNTER — Emergency Department (HOSPITAL_COMMUNITY)
Admission: EM | Admit: 2021-01-02 | Discharge: 2021-01-02 | Disposition: A | Discharge status: Home / Self Care | Payer: Medicare Other | Attending: Emergency Medicine | Admitting: Emergency Medicine

## 2021-01-02 ENCOUNTER — Encounter (HOSPITAL_COMMUNITY): Payer: Self-pay | Admitting: Emergency Medicine

## 2021-01-02 ENCOUNTER — Other Ambulatory Visit: Payer: Self-pay

## 2021-01-02 ENCOUNTER — Other Ambulatory Visit (HOSPITAL_COMMUNITY): Payer: Self-pay | Admitting: Hematology

## 2021-01-02 ENCOUNTER — Ambulatory Visit
Admission: RE | Admit: 2021-01-02 | Discharge: 2021-01-02 | Disposition: A | Discharge status: Home / Self Care | Payer: Medicare Other | Source: Ambulatory Visit | Attending: Radiation Oncology | Admitting: Radiation Oncology

## 2021-01-02 ENCOUNTER — Other Ambulatory Visit (HOSPITAL_COMMUNITY): Payer: Self-pay

## 2021-01-02 DIAGNOSIS — R112 Nausea with vomiting, unspecified: Secondary | ICD-10-CM

## 2021-01-02 DIAGNOSIS — E860 Dehydration: Secondary | ICD-10-CM | POA: Insufficient documentation

## 2021-01-02 DIAGNOSIS — Z7984 Long term (current) use of oral hypoglycemic drugs: Secondary | ICD-10-CM | POA: Insufficient documentation

## 2021-01-02 DIAGNOSIS — Z79899 Other long term (current) drug therapy: Secondary | ICD-10-CM | POA: Diagnosis not present

## 2021-01-02 DIAGNOSIS — I129 Hypertensive chronic kidney disease with stage 1 through stage 4 chronic kidney disease, or unspecified chronic kidney disease: Secondary | ICD-10-CM | POA: Diagnosis not present

## 2021-01-02 DIAGNOSIS — C9000 Multiple myeloma not having achieved remission: Secondary | ICD-10-CM

## 2021-01-02 DIAGNOSIS — Z7901 Long term (current) use of anticoagulants: Secondary | ICD-10-CM | POA: Diagnosis not present

## 2021-01-02 DIAGNOSIS — R197 Diarrhea, unspecified: Secondary | ICD-10-CM | POA: Diagnosis not present

## 2021-01-02 DIAGNOSIS — Z510 Encounter for antineoplastic radiation therapy: Secondary | ICD-10-CM | POA: Diagnosis not present

## 2021-01-02 DIAGNOSIS — N184 Chronic kidney disease, stage 4 (severe): Secondary | ICD-10-CM | POA: Insufficient documentation

## 2021-01-02 LAB — COMPREHENSIVE METABOLIC PANEL WITH GFR
ALT: 18 U/L (ref 0–44)
AST: 20 U/L (ref 15–41)
Albumin: 3.9 g/dL (ref 3.5–5.0)
Alkaline Phosphatase: 52 U/L (ref 38–126)
Anion gap: 8 (ref 5–15)
BUN: 34 mg/dL — ABNORMAL HIGH (ref 8–23)
CO2: 24 mmol/L (ref 22–32)
Calcium: 8.6 mg/dL — ABNORMAL LOW (ref 8.9–10.3)
Chloride: 106 mmol/L (ref 98–111)
Creatinine, Ser: 1.66 mg/dL — ABNORMAL HIGH (ref 0.44–1.00)
GFR, Estimated: 33 mL/min — ABNORMAL LOW
Glucose, Bld: 250 mg/dL — ABNORMAL HIGH (ref 70–99)
Potassium: 4.2 mmol/L (ref 3.5–5.1)
Sodium: 138 mmol/L (ref 135–145)
Total Bilirubin: 0.5 mg/dL (ref 0.3–1.2)
Total Protein: 6.7 g/dL (ref 6.5–8.1)

## 2021-01-02 LAB — CBC WITH DIFFERENTIAL/PLATELET
Abs Immature Granulocytes: 0.03 10*3/uL (ref 0.00–0.07)
Basophils Absolute: 0 10*3/uL (ref 0.0–0.1)
Basophils Relative: 1 %
Eosinophils Absolute: 0.1 10*3/uL (ref 0.0–0.5)
Eosinophils Relative: 1 %
HCT: 33.3 % — ABNORMAL LOW (ref 36.0–46.0)
Hemoglobin: 10.6 g/dL — ABNORMAL LOW (ref 12.0–15.0)
Immature Granulocytes: 1 %
Lymphocytes Relative: 8 %
Lymphs Abs: 0.3 10*3/uL — ABNORMAL LOW (ref 0.7–4.0)
MCH: 33.2 pg (ref 26.0–34.0)
MCHC: 31.8 g/dL (ref 30.0–36.0)
MCV: 104.4 fL — ABNORMAL HIGH (ref 80.0–100.0)
Monocytes Absolute: 0.6 10*3/uL (ref 0.1–1.0)
Monocytes Relative: 15 %
Neutro Abs: 3.2 10*3/uL (ref 1.7–7.7)
Neutrophils Relative %: 74 %
Platelets: 103 10*3/uL — ABNORMAL LOW (ref 150–400)
RBC: 3.19 MIL/uL — ABNORMAL LOW (ref 3.87–5.11)
RDW: 13.1 % (ref 11.5–15.5)
WBC: 4.2 10*3/uL (ref 4.0–10.5)
nRBC: 0 % (ref 0.0–0.2)

## 2021-01-02 LAB — C DIFFICILE QUICK SCREEN W PCR REFLEX
C Diff antigen: NEGATIVE
C Diff interpretation: NOT DETECTED
C Diff toxin: NEGATIVE

## 2021-01-02 MED ORDER — LACTATED RINGERS IV BOLUS
1000.0000 mL | Freq: Once | INTRAVENOUS | Status: AC
  Administered 2021-01-02: 1000 mL via INTRAVENOUS

## 2021-01-02 MED ORDER — HEPARIN SOD (PORK) LOCK FLUSH 100 UNIT/ML IV SOLN
500.0000 [IU] | Freq: Once | INTRAVENOUS | Status: AC
  Administered 2021-01-02: 500 [IU]
  Filled 2021-01-02: qty 5

## 2021-01-02 MED ORDER — ONDANSETRON 8 MG PO TBDP: ORAL_TABLET | ORAL | 0 refills | Status: DC

## 2021-01-02 MED ORDER — ACETAMINOPHEN 500 MG PO TABS
1000.0000 mg | ORAL_TABLET | Freq: Once | ORAL | Status: AC
  Administered 2021-01-02: 1000 mg via ORAL
  Filled 2021-01-02: qty 2

## 2021-01-02 MED ORDER — POMALIDOMIDE 2 MG PO CAPS: 2.0000 mg | ORAL_CAPSULE | Freq: Every day | ORAL | 1 refills | Status: DC

## 2021-01-02 MED ORDER — LOPERAMIDE HCL 2 MG PO CAPS
4.0000 mg | ORAL_CAPSULE | Freq: Once | ORAL | Status: AC
  Administered 2021-01-02: 4 mg via ORAL
  Filled 2021-01-02: qty 2

## 2021-01-02 MED ORDER — ONDANSETRON HCL 4 MG/2ML IJ SOLN
4.0000 mg | Freq: Once | INTRAMUSCULAR | Status: AC
  Administered 2021-01-02: 4 mg via INTRAVENOUS
  Filled 2021-01-02: qty 2

## 2021-01-02 NOTE — Telephone Encounter (Signed)
Chart reviewed. Pomalyst refilled per Dr. Delton Coombes

## 2021-01-02 NOTE — ED Provider Notes (Signed)
[facility] EMERGENCY DEPARTMENT Provider Note   CSN: [id number] Arrival date & time: 01/02/21  0303     History Chief Complaint  Patient presents with  . N/V/D    Alexandra Price is a 75 y.o. female.  75 year old female who presents to the ER with nonbloody nonbilious emesis and diarrhea for the last few hours.  Patient states she had over 20 episodes of emesis and over 10 episodes of diarrhea.  She states that no known sick contacts.  No suspicious food intake.  Patient does not have any focal abdominal pains, generalized cramping.  No fevers that she knows of.  She is getting radiation on her sternum.        Past Medical History:  Diagnosis Date  . Anxiety   . Depression   . Hypertension   . Multiple myeloma ([place])    multiple myeloma    Patient Active Problem List   Diagnosis Date Noted  . Pneumonia 10/13/2020  . Acute respiratory failure with hypoxia ([place]) 10/13/2020  . Palliative care patient 06/26/2019  . Chest pain 06/04/2018  . Depression 05/29/2018  . Hyperglycemia 05/28/2018  . HTN (hypertension) 05/28/2018  . Anxiety 05/28/2018  . Rash 05/27/2018  . Pseudomonas infection 05/26/2018  . SOB (shortness of breath) 05/24/2018  . CAP (community acquired pneumonia) 05/23/2018  . Fever 05/22/2018  . Sinus tachycardia 05/22/2018  . CKD (chronic kidney disease) stage 4, GFR 15-29 ml/min (HCC) 05/22/2018  . S/P laparoscopic appendectomy 05/20/2018  . Multiple myeloma without remission ([place]) 04/15/2018  . Goals of care, counseling/discussion 04/15/2018  . Multiple myeloma not having achieved remission ([place]) 04/07/2018    Past Surgical History:  Procedure Laterality Date  . ABDOMINAL HYSTERECTOMY     total  . APPENDECTOMY    . LAPAROSCOPIC APPENDECTOMY N/A 05/20/2018   Procedure: APPENDECTOMY LAPAROSCOPIC;  Surgeon: [name], MD;  Location: [facility];  Service: General;  Laterality: N/A;  . PORTACATH PLACEMENT Right 04/14/2018   Procedure: INSERTION  PORT-A-CATH;  Surgeon: [name], MD;  Location: [facility];  Service: General;  Laterality: Right;     OB History   No obstetric history on file.     Family History  Problem Relation Age of Onset  . Heart disease Mother   . Emphysema Father   . Diabetes Sister   . Depression Sister   . Cancer Brother        liver, lung, and colon  . Diabetes Brother     Social History   Tobacco Use  . Smoking status: Never Smoker  . Smokeless tobacco: Never Used  Vaping Use  . Vaping Use: Never used  Substance Use Topics  . Alcohol use: Never  . Drug use: Not Currently    Home Medications Prior to Admission medications   Medication Sig Start Date End Date Taking? Authorizing Provider  ondansetron (ZOFRAN ODT) 8 MG disintegrating tablet 67m ODT q4 hours prn nausea 01/02/21  Yes Decklin Weddington, JCorene Cornea MD  acyclovir (ZOVIRAX) 400 MG tablet Take 1 tablet (400 mg total) by mouth 2 (two) times daily. 09/08/20   KDerek Jack MD  albuterol (VENTOLIN HFA) 108 (90 Base) MCG/ACT inhaler Inhale 2 puffs into the lungs in the morning, at noon, in the evening, and at bedtime. Patient not taking: Reported on 12/08/2020 10/16/20   PLavina Hamman MD  ALPRAZolam (Duanne Moron 0.5 MG tablet Take one tablet during the day as needed for anxiety and two tablets at bedtime as needed for anxiety/insomnia 12/16/20   KDelton Coombes  Dirk Dress, MD  benzonatate (TESSALON) 100 MG capsule Take 1 capsule (100 mg total) by mouth 3 (three) times daily. Patient not taking: Reported on 12/08/2020 10/16/20   Lavina Hamman, MD  Calcium Carb-Cholecalciferol (CALCIUM 1000 + D PO) Take 1,000 mg by mouth daily. Take one tablet daily until next visit with oncologist.    [provider]  citalopram (CELEXA) 40 MG tablet Take 1 tablet (40 mg total) by mouth daily. 09/27/20   [name], MD  ELIQUIS 5 MG TABS tablet Take 5 mg by mouth 2 (two) times daily. 05/24/20   [provider]  glipiZIDE (GLUCOTROL XL) 5 MG 24 hr  tablet Take 1 tablet by mouth once daily with breakfast 04/14/20   [name], MD  HYDROcodone-acetaminophen (NORCO) 7.5-325 MG tablet Take 1 tablet by mouth every 6 (six) hours as needed for moderate pain. 12/29/20   [name], MD  loperamide (IMODIUM) 2 MG capsule Take 1 capsule (2 mg total) by mouth as needed for diarrhea or loose stools. Patient not taking: Reported on 12/08/2020 10/16/20   Lavina Hamman, MD  Multiple Vitamin (THERA) TABS Take 1 tablet by mouth daily. 11/21/20   [name], MD  ondansetron (ZOFRAN) 4 MG tablet Take 1 tablet (4 mg total) by mouth every 8 (eight) hours as needed for nausea or vomiting. Patient not taking: Reported on 12/08/2020 06/12/18   [name], MD  pantoprazole (PROTONIX) 40 MG tablet Take 1 tablet by mouth once daily 08/23/20   [name], MD  POMALYST 2 MG capsule TAKE 1 CAPSULE BY MOUTH ONCE DAILY 12/06/20   [name], MD  rosuvastatin (CRESTOR) 10 MG tablet Take 10 mg by mouth daily. 05/30/20 05/30/21  [provider]  saccharomyces boulardii (FLORASTOR) 250 MG capsule Take 1 capsule (250 mg total) by mouth 2 (two) times daily. Patient not taking: Reported on 12/08/2020 10/16/20   Lavina Hamman, MD  terbinafine (LAMISIL) 1 % cream Apply 1 application topically 2 (two) times daily. 07/25/18   Lockamy, Randi L, NP-C  zolpidem (AMBIEN) 10 MG tablet Take 1 tablet (10 mg total) by mouth at bedtime as needed for sleep. 11/21/20   [name], MD  prochlorperazine (COMPAZINE) 10 MG tablet Take 1 tablet (10 mg total) by mouth every 6 (six) hours as needed (Nausea or vomiting). 10/08/18 01/26/19  Glennie Isle, NP-C    Allergies    Ciprofloxacin, Amoxicillin, Morphine, Morphine and related, and Augmentin [amoxicillin-pot clavulanate]  Review of Systems   Review of Systems  All other systems reviewed and are negative.   Physical Exam Updated Vital Signs BP (!) 115/57   Pulse 68   Temp  98.3 F (36.8 C) (Oral)   Resp 18   Ht _0  (1.676 m)   Wt 76.7 kg   SpO2 93%   BMI 27.28 kg/m   Physical Exam Vitals and nursing note reviewed.  Constitutional:      Appearance: She is well-developed.  HENT:     Head: Normocephalic and atraumatic.     Mouth/Throat:     Mouth: Mucous membranes are dry.     Pharynx: Oropharynx is clear.  Eyes:     Pupils: Pupils are equal, round, and reactive to light.  Cardiovascular:     Rate and Rhythm: Normal rate and regular rhythm.  Pulmonary:     Effort: No respiratory distress.     Breath sounds: No stridor.  Abdominal:     General: Abdomen is flat. There is no  distension.  Musculoskeletal:        General: No swelling or tenderness. Normal range of motion.     Cervical back: Normal range of motion.  Skin:    General: Skin is warm and dry.  Neurological:     General: No focal deficit present.     Mental Status: She is alert.     ED Results / Procedures / Treatments   Labs (all labs ordered are listed, but only abnormal results are displayed) Labs Reviewed  CBC WITH DIFFERENTIAL/PLATELET - Abnormal; Notable for the following components:      Result Value   RBC 3.19 (*)    Hemoglobin 10.6 (*)    HCT 33.3 (*)    MCV 104.4 (*)    Platelets 103 (*)    Lymphs Abs 0.3 (*)    All other components within normal limits  COMPREHENSIVE METABOLIC PANEL - Abnormal; Notable for the following components:   Glucose, Bld 250 (*)    BUN 34 (*)    Creatinine, Ser 1.66 (*)    Calcium 8.6 (*)    GFR, Estimated 33 (*)    All other components within normal limits  GASTROINTESTINAL PANEL BY PCR, STOOL (REPLACES STOOL CULTURE)  C DIFFICILE QUICK SCREEN W PCR REFLEX    EKG None  Radiology No results found.  Procedures Procedures   Medications Ordered in ED Medications  ondansetron (ZOFRAN) injection 4 mg (4 mg Intravenous Given 01/02/21 0435)  lactated ringers bolus 1,000 mL (0 mLs Intravenous Stopped 01/02/21 0548)  loperamide  (IMODIUM) capsule 4 mg (4 mg Oral Given 01/02/21 0438)  acetaminophen (TYLENOL) tablet 1,000 mg (1,000 mg Oral Given 01/02/21 0551)    ED Course  I have reviewed the triage vital signs and the nursing notes.  Pertinent labs & imaging results that were available during my care of the patient were reviewed by me and considered in my medical decision making (see chart for details).    MDM Rules/Calculators/A&P                         Mild dehydration. Will give some fluids. Check electrolytes. Symptomatic treatment.   toleratign PO. No longer symptomatic. Diarrhea improved. No e/o severe dehydration. Pending stool studies, will need to call if positive.  Consider also possible radiation side effect? She will d/w rad onc team.   Final Clinical Impression(s) / ED Diagnoses Final diagnoses:  Non-intractable vomiting with nausea, unspecified vomiting type  Diarrhea, unspecified type    Rx / DC Orders ED Discharge Orders         Ordered    ondansetron (ZOFRAN ODT) 8 MG disintegrating tablet        01/02/21 0636           Paddy Walthall, Corene Cornea, MD 01/02/21 [phone]

## 2021-01-02 NOTE — ED Triage Notes (Signed)
Pt c/o nausea, vomiting, and diarrhea since about 10pm last night. Pt also states that she now has a headache.

## 2021-01-03 ENCOUNTER — Ambulatory Visit
Admission: RE | Admit: 2021-01-03 | Discharge: 2021-01-03 | Disposition: A | Discharge status: Home / Self Care | Payer: Medicare Other | Source: Ambulatory Visit | Attending: Radiation Oncology | Admitting: Radiation Oncology

## 2021-01-03 DIAGNOSIS — Z510 Encounter for antineoplastic radiation therapy: Secondary | ICD-10-CM | POA: Diagnosis not present

## 2021-01-03 LAB — GASTROINTESTINAL PANEL BY PCR, STOOL (REPLACES STOOL CULTURE)

## 2021-01-04 ENCOUNTER — Other Ambulatory Visit: Payer: Self-pay

## 2021-01-04 ENCOUNTER — Ambulatory Visit
Admission: RE | Admit: 2021-01-04 | Discharge: 2021-01-04 | Disposition: A | Discharge status: Home / Self Care | Payer: Medicare Other | Source: Ambulatory Visit | Attending: Radiation Oncology | Admitting: Radiation Oncology

## 2021-01-04 DIAGNOSIS — Z510 Encounter for antineoplastic radiation therapy: Secondary | ICD-10-CM | POA: Diagnosis not present

## 2021-01-05 ENCOUNTER — Other Ambulatory Visit: Payer: Self-pay

## 2021-01-05 ENCOUNTER — Ambulatory Visit
Admission: RE | Admit: 2021-01-05 | Discharge: 2021-01-05 | Disposition: A | Discharge status: Home / Self Care | Payer: Medicare Other | Source: Ambulatory Visit | Attending: Radiation Oncology | Admitting: Radiation Oncology

## 2021-01-05 DIAGNOSIS — Z510 Encounter for antineoplastic radiation therapy: Secondary | ICD-10-CM | POA: Diagnosis not present

## 2021-01-06 ENCOUNTER — Ambulatory Visit
Admission: RE | Admit: 2021-01-06 | Discharge: 2021-01-06 | Disposition: A | Discharge status: Home / Self Care | Payer: Medicare Other | Source: Ambulatory Visit | Attending: Radiation Oncology | Admitting: Radiation Oncology

## 2021-01-06 ENCOUNTER — Encounter: Payer: Self-pay | Admitting: Radiation Oncology

## 2021-01-06 DIAGNOSIS — Z510 Encounter for antineoplastic radiation therapy: Secondary | ICD-10-CM | POA: Diagnosis not present

## 2021-01-09 ENCOUNTER — Encounter (HOSPITAL_COMMUNITY): Payer: Self-pay | Admitting: Hematology

## 2021-01-09 NOTE — Progress Notes (Signed)
  Patient Name: Alexandra Price MRN: [id number] DOB: 1945/10/03 Referring Physician: [place] (Profile Not Attached) Date of Service: 01/06/2021 [facility], [place]                                                        End Of Treatment Note  Diagnoses: C90.00-Multiple myeloma not having achieved remission  Cancer Staging: Multiple Myeloma  Intent: Palliative  Radiation Treatment Dates: 12/26/2020 through 01/06/2021 Site Technique Total Dose (Gy) Dose per Fx (Gy) Completed Fx Beam Energies  Thorax: Chest Complex 25/25 2.5 10/10 6X, 10X   Narrative: The patient tolerated radiation therapy relatively well. She did have some complaints to back pain and was also diagnosed with a GI virus during treamtent.  Plan: The patient will receive a call in about one month from the radiation oncology department. She will continue follow up with Dr. Delton Coombes as well.   ________________________________________________    Carola Rhine, [facility]

## 2021-01-17 ENCOUNTER — Other Ambulatory Visit (HOSPITAL_COMMUNITY): Payer: Self-pay

## 2021-01-17 DIAGNOSIS — C9000 Multiple myeloma not having achieved remission: Secondary | ICD-10-CM

## 2021-01-17 MED ORDER — GLIPIZIDE ER 5 MG PO TB24: ORAL_TABLET | ORAL | 3 refills | Status: DC

## 2021-01-19 ENCOUNTER — Other Ambulatory Visit (HOSPITAL_COMMUNITY): Payer: Medicare Other

## 2021-01-19 ENCOUNTER — Encounter (HOSPITAL_COMMUNITY): Payer: Self-pay

## 2021-01-19 ENCOUNTER — Inpatient Hospital Stay (HOSPITAL_COMMUNITY): Payer: Medicare Other | Attending: Hematology

## 2021-01-19 ENCOUNTER — Other Ambulatory Visit: Payer: Self-pay

## 2021-01-19 DIAGNOSIS — I129 Hypertensive chronic kidney disease with stage 1 through stage 4 chronic kidney disease, or unspecified chronic kidney disease: Secondary | ICD-10-CM | POA: Diagnosis not present

## 2021-01-19 DIAGNOSIS — D631 Anemia in chronic kidney disease: Secondary | ICD-10-CM | POA: Diagnosis not present

## 2021-01-19 DIAGNOSIS — I2699 Other pulmonary embolism without acute cor pulmonale: Secondary | ICD-10-CM | POA: Diagnosis not present

## 2021-01-19 DIAGNOSIS — Z452 Encounter for adjustment and management of vascular access device: Secondary | ICD-10-CM | POA: Insufficient documentation

## 2021-01-19 DIAGNOSIS — Z7901 Long term (current) use of anticoagulants: Secondary | ICD-10-CM | POA: Insufficient documentation

## 2021-01-19 DIAGNOSIS — C9001 Multiple myeloma in remission: Secondary | ICD-10-CM

## 2021-01-19 DIAGNOSIS — N189 Chronic kidney disease, unspecified: Secondary | ICD-10-CM | POA: Insufficient documentation

## 2021-01-19 DIAGNOSIS — C9000 Multiple myeloma not having achieved remission: Secondary | ICD-10-CM | POA: Diagnosis not present

## 2021-01-19 LAB — COMPREHENSIVE METABOLIC PANEL
ALT: 15 U/L (ref 0–44)
AST: 18 U/L (ref 15–41)
Albumin: 3.9 g/dL (ref 3.5–5.0)
Alkaline Phosphatase: 54 U/L (ref 38–126)
Anion gap: 7 (ref 5–15)
BUN: 21 mg/dL (ref 8–23)
CO2: 28 mmol/L (ref 22–32)
Calcium: 9 mg/dL (ref 8.9–10.3)
Chloride: 101 mmol/L (ref 98–111)
Creatinine, Ser: 1.5 mg/dL — ABNORMAL HIGH (ref 0.44–1.00)
GFR, Estimated: 37 mL/min — ABNORMAL LOW (ref >60)
Glucose, Bld: 149 mg/dL — ABNORMAL HIGH (ref 70–99)
Potassium: 4.3 mmol/L (ref 3.5–5.1)
Sodium: 136 mmol/L (ref 135–145)
Total Bilirubin: 0.7 mg/dL (ref 0.3–1.2)
Total Protein: 6.3 g/dL — ABNORMAL LOW (ref 6.5–8.1)

## 2021-01-19 LAB — CBC WITH DIFFERENTIAL/PLATELET
Abs Immature Granulocytes: 0 10*3/uL (ref 0.00–0.07)
Basophils Absolute: 0 10*3/uL (ref 0.0–0.1)
Basophils Relative: 2 %
Eosinophils Absolute: 0.2 10*3/uL (ref 0.0–0.5)
Eosinophils Relative: 6 %
HCT: 29.9 % — ABNORMAL LOW (ref 36.0–46.0)
Hemoglobin: 9.5 g/dL — ABNORMAL LOW (ref 12.0–15.0)
Immature Granulocytes: 0 %
Lymphocytes Relative: 20 %
Lymphs Abs: 0.5 10*3/uL — ABNORMAL LOW (ref 0.7–4.0)
MCH: 33.9 pg (ref 26.0–34.0)
MCHC: 31.8 g/dL (ref 30.0–36.0)
MCV: 106.8 fL — ABNORMAL HIGH (ref 80.0–100.0)
Monocytes Absolute: 0.4 10*3/uL (ref 0.1–1.0)
Monocytes Relative: 14 %
Neutro Abs: 1.5 10*3/uL — ABNORMAL LOW (ref 1.7–7.7)
Neutrophils Relative %: 58 %
Platelets: 132 10*3/uL — ABNORMAL LOW (ref 150–400)
RBC: 2.8 MIL/uL — ABNORMAL LOW (ref 3.87–5.11)
RDW: 13.2 % (ref 11.5–15.5)
WBC: 2.6 10*3/uL — ABNORMAL LOW (ref 4.0–10.5)
nRBC: 0 % (ref 0.0–0.2)

## 2021-01-19 MED ORDER — HEPARIN SOD (PORK) LOCK FLUSH 100 UNIT/ML IV SOLN
500.0000 [IU] | Freq: Once | INTRAVENOUS | Status: AC
Start: 2021-01-19 — End: 2021-01-19
  Administered 2021-01-19: 500 [IU] via INTRAVENOUS

## 2021-01-19 MED ORDER — SODIUM CHLORIDE 0.9% FLUSH
10.0000 mL | Freq: Once | INTRAVENOUS | Status: AC
  Administered 2021-01-19: 10 mL via INTRAVENOUS

## 2021-01-19 NOTE — Progress Notes (Signed)
Labs drawn and Port flushed with good blood return noted. No bruising or swelling at site. Bandaid applied and patient discharged in satisfactory condition. VVS stable with no signs or symptoms of distressed noted.

## 2021-01-19 NOTE — Patient Instructions (Signed)
[facility]  Discharge Instructions: Thank you for choosing [facility] to provide your oncology and hematology care.  If you have a lab appointment with the Cancer Center, please come in thru the Main Entrance and check in at the main information desk.  Wear comfortable clothing and clothing appropriate for easy access to any Portacath or PICC line.   We strive to give you quality time with your provider. You may need to reschedule your appointment if you arrive late (15 or more minutes).  Arriving late affects you and other patients whose appointments are after yours.  Also, if you miss three or more appointments without notifying the office, you may be dismissed from the clinic at the provider's discretion.      For prescription refill requests, have your pharmacy contact our office and allow 72 hours for refills to be completed.    Today your port was flushed and labs were drawn, return at scheduled time.   To help prevent nausea and vomiting after your treatment, we encourage you to take your nausea medication as directed.  BELOW ARE SYMPTOMS THAT SHOULD BE REPORTED IMMEDIATELY: *FEVER GREATER THAN 100.4 F (38 C) OR HIGHER *CHILLS OR SWEATING *NAUSEA AND VOMITING THAT IS NOT CONTROLLED WITH YOUR NAUSEA MEDICATION *UNUSUAL SHORTNESS OF BREATH *UNUSUAL BRUISING OR BLEEDING *URINARY PROBLEMS (pain or burning when urinating, or frequent urination) *BOWEL PROBLEMS (unusual diarrhea, constipation, pain near the anus) TENDERNESS IN MOUTH AND THROAT WITH OR WITHOUT PRESENCE OF ULCERS (sore throat, sores in mouth, or a toothache) UNUSUAL RASH, SWELLING OR PAIN  UNUSUAL VAGINAL DISCHARGE OR ITCHING   Items with * indicate a potential emergency and should be followed up as soon as possible or go to the Emergency Department if any problems should occur.  Please show the CHEMOTHERAPY ALERT CARD or IMMUNOTHERAPY ALERT CARD at check-in to the Emergency Department and  triage nurse.  Should you have questions after your visit or need to cancel or reschedule your appointment, please contact [facility] [phone]  and follow the prompts.  Office hours are 8:00 a.m. to 4:30 p.m. Monday - Friday. Please note that voicemails left after 4:00 p.m. may not be returned until the following business day.  We are closed weekends and major holidays. You have access to a nurse at all times for urgent questions. Please call the main number to the clinic [phone] and follow the prompts.  For any non-urgent questions, you may also contact your provider using MyChart. We now offer e-Visits for anyone 18 and older to request care online for non-urgent symptoms. For details visit [email or web address].   Also download the MyChart app! Go to the app store, search "MyChart", open the app, select [facility], and log in with your MyChart username and password.  Due to Covid, a mask is required upon entering the hospital/clinic. If you do not have a mask, one will be given to you upon arrival. For doctor visits, patients may have 1 support person aged 18 or older with them. For treatment visits, patients cannot have anyone with them due to current Covid guidelines and our immunocompromised population.  

## 2021-01-20 LAB — KAPPA/LAMBDA LIGHT CHAINS
Kappa free light chain: 40.3 mg/L — ABNORMAL HIGH (ref 3.3–19.4)
Kappa, lambda light chain ratio: 1.33 (ref 0.26–1.65)
Lambda free light chains: 30.2 mg/L — ABNORMAL HIGH (ref 5.7–26.3)

## 2021-01-20 LAB — PROTEIN ELECTROPHORESIS, SERUM
A/G Ratio: 1.5 (ref 0.7–1.7)
Albumin ELP: 3.7 g/dL (ref 2.9–4.4)
Alpha-1-Globulin: 0.2 g/dL (ref 0.0–0.4)
Alpha-2-Globulin: 0.7 g/dL (ref 0.4–1.0)
Beta Globulin: 0.8 g/dL (ref 0.7–1.3)
Gamma Globulin: 0.7 g/dL (ref 0.4–1.8)
Globulin, Total: 2.4 g/dL (ref 2.2–3.9)
Total Protein ELP: 6.1 g/dL (ref 6.0–8.5)

## 2021-01-25 LAB — IMMUNOFIXATION ELECTROPHORESIS
IgA: 72 mg/dL (ref 64–422)
IgG (Immunoglobin G), Serum: 744 mg/dL (ref 586–1602)
IgM (Immunoglobulin M), Srm: 28 mg/dL (ref 26–217)
Total Protein ELP: 6 g/dL (ref 6.0–8.5)

## 2021-01-25 NOTE — Progress Notes (Signed)
Alexandra Price, Alexandra Price [id number]   CLINIC:  Medical Oncology/Hematology  PCP:  [facility] / [place] [address] [phone]   REASON FOR VISIT:  Follow-up for multiple myeloma  PRIOR THERAPY:  1. KPD x 4 cycles from 04/17/2018 to 08/21/2018. 2. Stem cell transplant on 10/23/2018. 3. Radiation to left and right humerus 25 Gy in 10 fractions from 08/23/2020 to 08/25/2020.  NGS Results: not done  CURRENT THERAPY: Maintenance Pomalyst 3/4 weeks  BRIEF ONCOLOGIC HISTORY:  Oncology History  Multiple myeloma without remission ([place])  04/15/2018 Initial Diagnosis   Multiple myeloma without remission ([place])   04/17/2018 - 09/05/2018 Chemotherapy   The patient had dexamethasone (DECADRON) 4 MG tablet, 1 of 1 cycle, Start date: 04/15/2018, End date: 05/26/2018 palonosetron (ALOXI) injection 0.25 mg, 0.25 mg, Intravenous,  Once, 1 of 1 cycle Administration: 0.25 mg (04/17/2018), 0.25 mg (04/24/2018), 0.25 mg (05/01/2018) cyclophosphamide (CYTOXAN) 540 mg in sodium chloride 0.9 % 250 mL chemo infusion, 300 mg/m2 = 540 mg, Intravenous,  Once, 1 of 1 cycle Administration: 540 mg (04/17/2018), 540 mg (04/24/2018), 540 mg (05/01/2018) carfilzomib (KYPROLIS) 36 mg in dextrose 5 % 50 mL chemo infusion, 20 mg/m2 = 36 mg, Intravenous, Once, 5 of 5 cycles Administration: 36 mg (04/17/2018), 36 mg (04/18/2018), 60 mg (04/24/2018), 60 mg (04/25/2018), 60 mg (05/01/2018), 60 mg (05/02/2018), 60 mg (05/15/2018), 60 mg (05/16/2018), 60 mg (06/12/2018), 60 mg (06/13/2018), 60 mg (06/26/2018), 60 mg (06/27/2018), 60 mg (07/03/2018), 60 mg (07/04/2018), 60 mg (07/24/2018), 60 mg (07/25/2018), 60 mg (07/31/2018), 60 mg (08/01/2018), 60 mg (08/08/2018), 60 mg (08/07/2018), 60 mg (08/21/2018), 60 mg (08/22/2018), 60 mg (08/28/2018), 60 mg (08/29/2018), 60 mg (09/04/2018), 60 mg (09/05/2018)  for chemotherapy treatment.      CANCER STAGING: Cancer Staging No matching  staging information was found for the patient.  INTERVAL HISTORY:  [name], a 75 y.o. female, returns for routine follow-up of her multiple myeloma. Alexandra Price was last seen on 12/01/2020.   Today she reports feeling good. She denies developing any new pains. She had 3 episodes of diarrhea in 1 week after which she reported to the ED on 01/02/2021, but it has now completely resolved. She has pain in her left shoulder that is unchanged. She complains of increased fatigue. She is taking calcium and vitamin D. She reports occasional numbness in her shins and big toes, but she denies numbness in her hands.    REVIEW OF SYSTEMS:  Review of Systems  Constitutional:  Positive for fatigue (75%). Negative for appetite change.  Gastrointestinal:  Positive for diarrhea.  Musculoskeletal:  Positive for back pain (low back 4/10).  Neurological:  Positive for headaches.  Psychiatric/Behavioral:  Positive for depression and sleep disturbance. The patient is nervous/anxious.   All other systems reviewed and are negative.  PAST MEDICAL/SURGICAL HISTORY:  Past Medical History:  Diagnosis Date   Anxiety    Depression    Hypertension    Multiple myeloma ([place])    multiple myeloma   Past Surgical History:  Procedure Laterality Date   ABDOMINAL HYSTERECTOMY     total   APPENDECTOMY     LAPAROSCOPIC APPENDECTOMY N/A 05/20/2018   Procedure: APPENDECTOMY LAPAROSCOPIC;  Surgeon: [name], MD;  Location: [facility];  Service: General;  Laterality: N/A;   PORTACATH PLACEMENT Right 04/14/2018   Procedure: INSERTION PORT-A-CATH;  Surgeon: [name], MD;  Location: [facility];  Service: General;  Laterality:  Right;    SOCIAL HISTORY:  Social History   Socioeconomic History   Marital status: Legally Separated    Spouse name: Not on file   Number of children: 6   Years of education: Not on file   Highest education level: Not on file  Occupational History    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   Tobacco Use   Smoking status: Never Smoker   Smokeless tobacco: Never Used  Scientific laboratory technician Use: Never used  Substance and Sexual Activity   Alcohol use: Never   Drug use: Not Currently   Sexual activity: Not Currently  Other Topics Concern   Not on file  Social History Narrative   Not on file   Social Determinants of Health   Financial Resource Strain: Low Risk    Difficulty of Paying Living Expenses: Not hard at all  Food Insecurity: No Food Insecurity   Worried About Charity fundraiser in the Last Year: Never true   [place] in the Last Year: Never true  Transportation Needs: No Transportation Needs   Lack of Transportation (Medical): No   Lack of Transportation (Non-Medical): No  Physical Activity: Inactive   Days of Exercise per Week: 0 days   Minutes of Exercise per Session: 0 min  Stress: No Stress Concern Present   Feeling of Stress : Not at all  Social Connections: Moderately Isolated   Frequency of Communication with Friends and Family: More than three times a week   Frequency of Social Gatherings with Friends and Family: Twice a week   Attends Religious Services: 1 to 4 times per year   Active Member of [employer] or Organizations: No   Attends Archivist Meetings: Never   Marital Status: Separated  Human resources officer Violence: Not At Risk   Fear of Current or Ex-Partner: No   Emotionally Abused: No   Physically Abused: No   Sexually Abused: No    FAMILY HISTORY:  Family History  Problem Relation Age of Onset   Heart disease Mother    Emphysema Father    Diabetes Sister    Depression Sister    Cancer Brother        liver, lung, and colon   Diabetes Brother     CURRENT MEDICATIONS:  Current Outpatient Medications  Medication Sig Dispense Refill   acyclovir (ZOVIRAX) 400 MG tablet Take 1 tablet (400 mg total) by mouth 2 (two) times daily. 60 tablet 4   albuterol (VENTOLIN HFA) 108 (90 Base) MCG/ACT inhaler Inhale 2 puffs into the  lungs in the morning, at noon, in the evening, and at bedtime. 8 g 0   ALPRAZolam (XANAX) 0.5 MG tablet Take one tablet during the day as needed for anxiety and two tablets at bedtime as needed for anxiety/insomnia 90 tablet 3   benzonatate (TESSALON) 100 MG capsule Take 1 capsule (100 mg total) by mouth 3 (three) times daily. 20 capsule 0   Calcium Carb-Cholecalciferol (CALCIUM 1000 + D PO) Take 1,000 mg by mouth daily. Take one tablet daily until next visit with oncologist.     citalopram (CELEXA) 40 MG tablet Take 1 tablet (40 mg total) by mouth daily. 90 tablet 3   ELIQUIS 5 MG TABS tablet Take 5 mg by mouth 2 (two) times daily.     glipiZIDE (GLUCOTROL XL) 5 MG 24 hr tablet Take 1 tablet by mouth once daily with breakfast 90 tablet 3   HYDROcodone-acetaminophen (NORCO) 7.5-325 MG tablet Take 1  tablet by mouth every 6 (six) hours as needed for moderate pain. 120 tablet 0   loperamide (IMODIUM) 2 MG capsule Take 1 capsule (2 mg total) by mouth as needed for diarrhea or loose stools. 30 capsule 0   Multiple Vitamin (THERA) TABS Take 1 tablet by mouth daily. 30 tablet 5   ondansetron (ZOFRAN ODT) 8 MG disintegrating tablet 61m ODT q4 hours prn nausea 30 tablet 0   ondansetron (ZOFRAN) 4 MG tablet Take 1 tablet (4 mg total) by mouth every 8 (eight) hours as needed for nausea or vomiting. 20 tablet 1   pantoprazole (PROTONIX) 40 MG tablet Take 1 tablet by mouth once daily 90 tablet 0   pomalidomide (POMALYST) 2 MG capsule Take 1 capsule (2 mg total) by mouth daily. [employer] Auth # [id number]   Date Obtained 01/02/2021 21 capsule 1   rosuvastatin (CRESTOR) 10 MG tablet Take 10 mg by mouth daily.     saccharomyces boulardii (FLORASTOR) 250 MG capsule Take 1 capsule (250 mg total) by mouth 2 (two) times daily. 20 capsule 0   terbinafine (LAMISIL) 1 % cream Apply 1 application topically 2 (two) times daily. 30 g 0   zolpidem (AMBIEN) 10 MG tablet Take 1 tablet (10 mg total) by mouth at bedtime as needed for  sleep. 30 tablet 2   No current facility-administered medications for this visit.    ALLERGIES:  Allergies  Allergen Reactions   Ciprofloxacin Anaphylaxis   Amoxicillin    Morphine    Morphine And Related Nausea And Vomiting   Augmentin [Amoxicillin-Pot Clavulanate] Other (See Comments)    Headache, insomnia    PHYSICAL EXAM:  Performance status (ECOG): 1 - Symptomatic but completely ambulatory  There were no vitals filed for this visit. Wt Readings from Last 3 Encounters:  01/02/21 169 lb (76.7 kg)  12/01/20 170 lb 11.2 oz (77.4 kg)  11/14/20 174 lb 9.6 oz (79.2 kg)   Physical Exam Vitals reviewed.  Constitutional:      Appearance: Normal appearance.  Cardiovascular:     Rate and Rhythm: Normal rate and regular rhythm.     Pulses: Normal pulses.     Heart sounds: Normal heart sounds.  Pulmonary:     Effort: Pulmonary effort is normal.     Breath sounds: Normal breath sounds.  Musculoskeletal:     Right lower leg: No edema.     Left lower leg: No edema.  Neurological:     General: No focal deficit present.     Mental Status: She is alert and oriented to person, place, and time.  Psychiatric:        Mood and Affect: Mood normal.        Behavior: Behavior normal.     LABORATORY DATA:  I have reviewed the labs as listed.  CBC Latest Ref Rng & Units 01/19/2021 01/02/2021 11/07/2020  WBC 4.0 - 10.5 K/uL 2.6(L) 4.2 2.7(L)  Hemoglobin 12.0 - 15.0 g/dL 9.5(L) 10.6(L) 9.4(L)  Hematocrit 36.0 - 46.0 % 29.9(L) 33.3(L) 29.3(L)  Platelets 150 - 400 K/uL 132(L) 103(L) 79(L)   CMP Latest Ref Rng & Units 01/19/2021 01/02/2021 11/07/2020  Glucose 70 - 99 mg/dL 149(H) 250(H) 101(H)  BUN 8 - 23 mg/dL 21 34(H) 24(H)  Creatinine 0.44 - 1.00 mg/dL 1.50(H) 1.66(H) 1.60(H)  Sodium 135 - 145 mmol/L 136 138 140  Potassium 3.5 - 5.1 mmol/L 4.3 4.2 4.1  Chloride 98 - 111 mmol/L 101 106 105  CO2 22 - 32 mmol/L 28  24 27  Calcium 8.9 - 10.3 mg/dL 9.0 8.6(L) 8.9  Total Protein 6.5 - 8.1 g/dL  6.3(L) 6.7 6.6  Total Bilirubin 0.3 - 1.2 mg/dL 0.7 0.5 0.5  Alkaline Phos 38 - 126 U/L 54 52 41  AST 15 - 41 U/L 18 20 17   ALT 0 - 44 U/L 15 18 13     DIAGNOSTIC IMAGING:  I have independently reviewed the scans and discussed with the patient. No results found.   ASSESSMENT:  1.  IgG lambda plasma cell myeloma, stage II, standard risk: -4 cycles of KPD from 04/17/2018 through 08/21/2018, stem cell transplant on 10/23/2018. -PET scan on 01/21/2019 showed multiple bone lesions but without any hypermetabolic activity. -BMBX on 01/21/2019 with normocellular marrow with no increase in plasma cells.  Normal FISH.  MRD results negative. -Maintenance pomalidomide 2 mg 3 weeks on/1 week off started on 03/10/2019. -Bone marrow biopsy on 10/29/2019 shows trilineage hematopoiesis with no evidence of plasma cells.  Chromosome analysis and FISH are normal. -BM BX on 10/27/2020 at [facility] with 30 to 40%, TLH.  2% of the total cells are CD 138+ plasma cells.   2.  Pulmonary embolism: -CT angiogram on 04/21/2020 showed filling defect at [facility]. -VQ scan confirmed pulmonary embolism. -She is on Eliquis.   PLAN:  1.  IgG lambda plasma cell myeloma: - She is tolerating pomalidomide 2 mg 3 weeks on 1 week off reasonably well. - Reviewed labs from 01/19/2021.  M spike is negative.  Immunofixation was normal.  Free light chain ratio was also normal at 1.33.  Creatinine is 1.5 with normal calcium. - She has mild leukopenia with normal ANC from pomalidomide.  No recurrent infections. - Recommend continuing maintenance pomalidomide at this time. - RTC 2 months for follow-up with repeat myeloma labs.   2.  Back pain: - This has been stable.  She has completed radiation.   3.  Myeloma bone disease: - Xgeva on hold due to exposure of bone in the right lower jaw. - Continue calcium and vitamin D supplements.   4.  Anxiety: - Continue Xanax twice daily as needed.   5.  Sleeping  difficulty: - Continue Ambien 10 mg at bedtime as needed.   6.  Normocytic anemia: - Combination anemia from CKD, myelosuppression and pomalidomide. - Hemoglobin today is 9.5.  We will plan to check ferritin and iron panel at next visit.   7.  Pulmonary embolism: - Continue Eliquis.  No bleeding issues.   8.  Right shoulder and upper arm pain: - She received XRT to the chest from 12/26/2020 through 01/06/2021. - He now has some left shoulder pain which is chronic.   Orders placed this encounter:  No orders of the defined types were placed in this encounter.    [name], MD [facility] [phone]   I, Thana Ates, am acting as a scribe for Dr. [name].  I, [name] MD, have reviewed the above documentation for accuracy and completeness, and I agree with the above.

## 2021-01-26 ENCOUNTER — Inpatient Hospital Stay (HOSPITAL_BASED_OUTPATIENT_CLINIC_OR_DEPARTMENT_OTHER): Payer: Medicare Other | Admitting: Hematology

## 2021-01-26 ENCOUNTER — Other Ambulatory Visit (HOSPITAL_COMMUNITY): Payer: Self-pay | Admitting: Hematology

## 2021-01-26 ENCOUNTER — Other Ambulatory Visit: Payer: Self-pay

## 2021-01-26 VITALS — BP 133/58 | HR 59 | Temp 97.5°F | Resp 17 | Wt 167.3 lb

## 2021-01-26 DIAGNOSIS — C9000 Multiple myeloma not having achieved remission: Secondary | ICD-10-CM

## 2021-01-26 DIAGNOSIS — E640 Sequelae of protein-calorie malnutrition: Secondary | ICD-10-CM | POA: Diagnosis not present

## 2021-01-26 DIAGNOSIS — C9001 Multiple myeloma in remission: Secondary | ICD-10-CM

## 2021-01-26 DIAGNOSIS — E538 Deficiency of other specified B group vitamins: Secondary | ICD-10-CM | POA: Diagnosis not present

## 2021-01-26 NOTE — Patient Instructions (Addendum)
[facility] Discharge Instructions  You were seen today by Dr. Katragadda. He went over your recent results. Dr. Katragadda will see you back in 2 months for labs and follow up.   Thank you for choosing [facility] to provide your oncology and hematology care.  To afford each patient quality time with our provider, please arrive at least 15 minutes before your scheduled appointment time.   If you have a lab appointment with the Cancer Center please come in thru the Main Entrance and check in at the main information desk  You need to re-schedule your appointment should you arrive 10 or more minutes late.  We strive to give you quality time with our providers, and arriving late affects you and other patients whose appointments are after yours.  Also, if you no show three or more times for appointments you may be dismissed from the clinic at the providers discretion.     Again, thank you for choosing [facility].  Our hope is that these requests will decrease the amount of time that you wait before being seen by our physicians.       _____________________________________________________________  Should you have questions after your visit to [facility], please contact our office at [phone] between the hours of 8:00 a.m. and 4:30 p.m.  Voicemails left after 4:00 p.m. will not be returned until the following business day.  For prescription refill requests, have your pharmacy contact our office and allow 72 hours.    Cancer Center Support Programs:   > Cancer Support Group  2nd Tuesday of the month 1pm-2pm, Journey Room   

## 2021-01-26 NOTE — Telephone Encounter (Signed)
Chart reviewed. Pomalyst refilled per Dr. Delton Coombes

## 2021-01-31 ENCOUNTER — Other Ambulatory Visit (HOSPITAL_COMMUNITY): Payer: Self-pay

## 2021-01-31 MED ORDER — HYDROCODONE-ACETAMINOPHEN 7.5-325 MG PO TABS: 1.0000 | ORAL_TABLET | Freq: Four times a day (QID) | ORAL | 0 refills | Status: DC | PRN

## 2021-02-03 ENCOUNTER — Telehealth (HOSPITAL_COMMUNITY): Payer: Self-pay | Admitting: *Deleted

## 2021-02-03 NOTE — Telephone Encounter (Signed)
Patient called stating that she has had an increase in pain 5/10 aching in fingers and arms.  Increased fatigue with associated headaches.  Has taken hydrocodone with no relief.  Was mentioned at last visit that we would check iron studies at follow up witch will be 2 months from now.

## 2021-02-06 ENCOUNTER — Other Ambulatory Visit (HOSPITAL_COMMUNITY): Payer: Self-pay

## 2021-02-06 DIAGNOSIS — C9001 Multiple myeloma in remission: Secondary | ICD-10-CM

## 2021-02-06 NOTE — Telephone Encounter (Signed)
Patient called reporting increased fatigue since last visit requesting her iron levels be checked. Patient arranged for lab work. Dr. Raliegh Ip made aware.

## 2021-02-07 ENCOUNTER — Other Ambulatory Visit: Payer: Self-pay

## 2021-02-07 ENCOUNTER — Encounter (HOSPITAL_COMMUNITY): Payer: Self-pay

## 2021-02-07 ENCOUNTER — Inpatient Hospital Stay (HOSPITAL_COMMUNITY): Payer: Medicare Other

## 2021-02-07 DIAGNOSIS — C9001 Multiple myeloma in remission: Secondary | ICD-10-CM

## 2021-02-07 DIAGNOSIS — C9000 Multiple myeloma not having achieved remission: Secondary | ICD-10-CM | POA: Diagnosis not present

## 2021-02-07 LAB — CBC WITH DIFFERENTIAL/PLATELET
Basophils Absolute: 0 10*3/uL (ref 0.0–0.1)
Basophils Relative: 0 %
Eosinophils Absolute: 0.2 10*3/uL (ref 0.0–0.5)
Eosinophils Relative: 9 %
HCT: 28.4 % — ABNORMAL LOW (ref 36.0–46.0)
Hemoglobin: 9.1 g/dL — ABNORMAL LOW (ref 12.0–15.0)
Lymphocytes Relative: 28 %
Lymphs Abs: 0.6 10*3/uL — ABNORMAL LOW (ref 0.7–4.0)
MCH: 34 pg (ref 26.0–34.0)
MCHC: 32 g/dL (ref 30.0–36.0)
MCV: 106 fL — ABNORMAL HIGH (ref 80.0–100.0)
Monocytes Absolute: 0.4 10*3/uL (ref 0.1–1.0)
Monocytes Relative: 18 %
Neutro Abs: 0.9 10*3/uL — ABNORMAL LOW (ref 1.7–7.7)
Neutrophils Relative %: 45 %
Platelets: 109 10*3/uL — ABNORMAL LOW (ref 150–400)
RBC: 2.68 MIL/uL — ABNORMAL LOW (ref 3.87–5.11)
RDW: 13.1 % (ref 11.5–15.5)
WBC: 2.1 10*3/uL — ABNORMAL LOW (ref 4.0–10.5)
nRBC: 0 % (ref 0.0–0.2)

## 2021-02-07 LAB — COMPREHENSIVE METABOLIC PANEL
ALT: 13 U/L (ref 0–44)
AST: 15 U/L (ref 15–41)
Albumin: 3.8 g/dL (ref 3.5–5.0)
Alkaline Phosphatase: 54 U/L (ref 38–126)
Anion gap: 7 (ref 5–15)
BUN: 23 mg/dL (ref 8–23)
CO2: 28 mmol/L (ref 22–32)
Calcium: 8.9 mg/dL (ref 8.9–10.3)
Chloride: 102 mmol/L (ref 98–111)
Creatinine, Ser: 1.49 mg/dL — ABNORMAL HIGH (ref 0.44–1.00)
GFR, Estimated: 37 mL/min — ABNORMAL LOW (ref >60)
Glucose, Bld: 126 mg/dL — ABNORMAL HIGH (ref 70–99)
Potassium: 4 mmol/L (ref 3.5–5.1)
Sodium: 137 mmol/L (ref 135–145)
Total Bilirubin: 0.5 mg/dL (ref 0.3–1.2)
Total Protein: 6.3 g/dL — ABNORMAL LOW (ref 6.5–8.1)

## 2021-02-07 LAB — IRON AND TIBC
Iron: 60 ug/dL (ref 28–170)
Saturation Ratios: 26 % (ref 10.4–31.8)
TIBC: 229 ug/dL — ABNORMAL LOW (ref 250–450)
UIBC: 169 ug/dL

## 2021-02-07 LAB — FERRITIN: Ferritin: 334 ng/mL — ABNORMAL HIGH (ref 11–307)

## 2021-02-07 MED ORDER — HEPARIN SOD (PORK) LOCK FLUSH 100 UNIT/ML IV SOLN
500.0000 [IU] | Freq: Once | INTRAVENOUS | Status: AC
  Administered 2021-02-07: 500 [IU] via INTRAVENOUS

## 2021-02-07 MED ORDER — SODIUM CHLORIDE 0.9% FLUSH
10.0000 mL | INTRAVENOUS | Status: DC | PRN
  Administered 2021-02-07: 10 mL via INTRAVENOUS

## 2021-02-07 NOTE — Progress Notes (Signed)
Alexandra Price presented for Portacath access and flush.  Portacath located right chest wall accessed with  H 20 needle.  Good blood return present. Portacath flushed with 76ml NS and 500U/46ml Heparin and needle removed intact.  Procedure tolerated well and without incident.   Stable during and after procedure.  AVS reviewed.  Discharged in stable condition ambulatory.

## 2021-02-07 NOTE — Patient Instructions (Signed)
[facility]  Discharge Instructions: Thank you for choosing [facility] to provide your oncology and hematology care.  If you have a lab appointment with the [facility], please come in thru the Main Entrance and check in at the main information desk.  Wear comfortable clothing and clothing appropriate for easy access to any Portacath or PICC line.   We strive to give you quality time with your provider. You may need to reschedule your appointment if you arrive late (15 or more minutes).  Arriving late affects you and other patients whose appointments are after yours.  Also, if you miss three or more appointments without notifying the office, you may be dismissed from the clinic at the provider's discretion.      For prescription refill requests, have your pharmacy contact our office and allow 72 hours for refills to be completed.    Port flush with labs today.      To help prevent nausea and vomiting after your treatment, we encourage you to take your nausea medication as directed.  BELOW ARE SYMPTOMS THAT SHOULD BE REPORTED IMMEDIATELY: *FEVER GREATER THAN 100.4 F (38 C) OR HIGHER *CHILLS OR SWEATING *NAUSEA AND VOMITING THAT IS NOT CONTROLLED WITH YOUR NAUSEA MEDICATION *UNUSUAL SHORTNESS OF BREATH *UNUSUAL BRUISING OR BLEEDING *URINARY PROBLEMS (pain or burning when urinating, or frequent urination) *BOWEL PROBLEMS (unusual diarrhea, constipation, pain near the anus) TENDERNESS IN MOUTH AND THROAT WITH OR WITHOUT PRESENCE OF ULCERS (sore throat, sores in mouth, or a toothache) UNUSUAL RASH, SWELLING OR PAIN  UNUSUAL VAGINAL DISCHARGE OR ITCHING   Items with * indicate a potential emergency and should be followed up as soon as possible or go to the Emergency Department if any problems should occur.  Please show the CHEMOTHERAPY ALERT CARD or IMMUNOTHERAPY ALERT CARD at check-in to the Emergency Department and triage nurse.  Should you have questions  after your visit or need to cancel or reschedule your appointment, please contact [facility] [phone]  and follow the prompts.  Office hours are 8:00 a.m. to 4:30 p.m. Monday - Friday. Please note that voicemails left after 4:00 p.m. may not be returned until the following business day.  We are closed weekends and major holidays. You have access to a nurse at all times for urgent questions. Please call the main number to the clinic [phone] and follow the prompts.  For any non-urgent questions, you may also contact your provider using MyChart. We now offer e-Visits for anyone 62 and older to request care online for non-urgent symptoms. For details visit [email or web address].   Also download the MyChart app! Go to the app store, search "MyChart", open the app, select [facility], and log in with your MyChart username and password.  Due to Covid, a mask is required upon entering the hospital/clinic. If you do not have a mask, one will be given to you upon arrival. For doctor visits, patients may have 1 support person aged 2 or older with them. For treatment visits, patients cannot have anyone with them due to current Covid guidelines and our immunocompromised population.

## 2021-02-13 ENCOUNTER — Encounter (HOSPITAL_COMMUNITY): Payer: Self-pay | Admitting: Hematology

## 2021-02-14 ENCOUNTER — Other Ambulatory Visit (HOSPITAL_COMMUNITY): Payer: Self-pay

## 2021-02-14 DIAGNOSIS — G479 Sleep disorder, unspecified: Secondary | ICD-10-CM

## 2021-02-14 DIAGNOSIS — R42 Dizziness and giddiness: Secondary | ICD-10-CM

## 2021-02-14 DIAGNOSIS — C9000 Multiple myeloma not having achieved remission: Secondary | ICD-10-CM

## 2021-02-14 MED ORDER — ZOLPIDEM TARTRATE 10 MG PO TABS: 10.0000 mg | ORAL_TABLET | Freq: Every evening | ORAL | 2 refills | Status: DC | PRN

## 2021-02-14 MED ORDER — ACYCLOVIR 400 MG PO TABS: 400.0000 mg | ORAL_TABLET | Freq: Two times a day (BID) | ORAL | 4 refills | Status: DC

## 2021-02-14 MED ORDER — PANTOPRAZOLE SODIUM 40 MG PO TBEC: 40.0000 mg | DELAYED_RELEASE_TABLET | Freq: Every day | ORAL | 3 refills | Status: DC

## 2021-02-21 ENCOUNTER — Other Ambulatory Visit (HOSPITAL_COMMUNITY): Payer: Self-pay | Admitting: Hematology

## 2021-02-21 DIAGNOSIS — C9000 Multiple myeloma not having achieved remission: Secondary | ICD-10-CM

## 2021-02-22 ENCOUNTER — Other Ambulatory Visit (HOSPITAL_COMMUNITY): Payer: Self-pay

## 2021-02-22 ENCOUNTER — Encounter (HOSPITAL_COMMUNITY): Payer: Self-pay | Admitting: Hematology

## 2021-02-22 DIAGNOSIS — C9000 Multiple myeloma not having achieved remission: Secondary | ICD-10-CM

## 2021-02-22 MED ORDER — POMALIDOMIDE 2 MG PO CAPS: 2.0000 mg | ORAL_CAPSULE | Freq: Every day | ORAL | 1 refills | Status: DC

## 2021-02-22 NOTE — Telephone Encounter (Signed)
Chart reviewed. Pomalyst refilled per last office visit with Dr. Delton Coombes

## 2021-02-28 ENCOUNTER — Other Ambulatory Visit (HOSPITAL_COMMUNITY): Payer: Self-pay

## 2021-02-28 MED ORDER — HYDROCODONE-ACETAMINOPHEN 7.5-325 MG PO TABS: 1.0000 | ORAL_TABLET | Freq: Four times a day (QID) | ORAL | 0 refills | Status: DC | PRN

## 2021-03-06 ENCOUNTER — Other Ambulatory Visit: Payer: Self-pay

## 2021-03-06 ENCOUNTER — Ambulatory Visit
Admission: RE | Admit: 2021-03-06 | Discharge: 2021-03-06 | Disposition: A | Discharge status: Home / Self Care | Payer: Medicare Other | Source: Ambulatory Visit | Attending: Radiation Oncology | Admitting: Radiation Oncology

## 2021-03-06 ENCOUNTER — Telehealth: Payer: Self-pay | Admitting: Radiation Oncology

## 2021-03-06 DIAGNOSIS — C9000 Multiple myeloma not having achieved remission: Secondary | ICD-10-CM | POA: Insufficient documentation

## 2021-03-06 NOTE — Progress Notes (Signed)
  Radiation Oncology         [phone] ________________________________  Name: Alexandra Price MRN: [id number]  Date of Service: 03/06/2021  DOB: [birth year]  Post Treatment Telephone Note  Diagnosis:   Multiple Myeloma  Interval Since Last Radiation:  9 weeks   12/26/2020 through 01/06/2021 Site Technique Total Dose (Gy) Dose per Fx (Gy) Completed Fx Beam Energies  Thorax: Chest Complex 25/25 2.5 10/10 6X, 10X    Narrative:  The patient was contacted today for routine follow-up. During treatment she did very well with radiotherapy and did not have significant desquamation but had an unrelated GI virus during treatment.    Impression/Plan: 1. Multiple Myeloma. I couldn't reach the patient but left a voicemail and on the message, discussed that we would be happy to continue to follow her as needed, but she will also continue to follow up with Dr. Delton Coombes in medical oncology in August 2022.     Carola Rhine, PAC

## 2021-03-06 NOTE — Telephone Encounter (Signed)
The patient called back and is doing pretty well overall. Her only symptom is fatigue and we discussed approaches to mediate this. She will notify us of any other concerns moving forward.

## 2021-03-27 ENCOUNTER — Other Ambulatory Visit: Payer: Self-pay

## 2021-03-27 ENCOUNTER — Ambulatory Visit (HOSPITAL_COMMUNITY)
Admission: RE | Admit: 2021-03-27 | Discharge: 2021-03-27 | Disposition: A | Discharge status: Home / Self Care | Payer: Medicare Other | Source: Ambulatory Visit | Attending: Hematology | Admitting: Hematology

## 2021-03-27 ENCOUNTER — Other Ambulatory Visit (HOSPITAL_COMMUNITY): Payer: Self-pay

## 2021-03-27 DIAGNOSIS — C9001 Multiple myeloma in remission: Secondary | ICD-10-CM

## 2021-03-27 NOTE — Progress Notes (Signed)
Patient called reporting R sided rib pain. Patient also ongoing weakness that has resulted in some falls recently. Dr. Delton Coombes made aware. Order for R rib x-ray ordered. Patient made aware.

## 2021-03-28 ENCOUNTER — Other Ambulatory Visit (HOSPITAL_COMMUNITY): Payer: Self-pay | Admitting: Hematology

## 2021-03-28 DIAGNOSIS — C9000 Multiple myeloma not having achieved remission: Secondary | ICD-10-CM

## 2021-03-28 NOTE — Telephone Encounter (Signed)
Chart reviewed. Pomalyst refilled per Dr. Tomie China last office note.

## 2021-03-29 ENCOUNTER — Encounter (HOSPITAL_COMMUNITY): Payer: Self-pay

## 2021-03-29 ENCOUNTER — Other Ambulatory Visit (HOSPITAL_COMMUNITY): Payer: Self-pay | Admitting: *Deleted

## 2021-03-29 ENCOUNTER — Inpatient Hospital Stay (HOSPITAL_COMMUNITY): Payer: Medicare Other | Attending: Hematology

## 2021-03-29 ENCOUNTER — Other Ambulatory Visit: Payer: Self-pay

## 2021-03-29 DIAGNOSIS — C9001 Multiple myeloma in remission: Secondary | ICD-10-CM

## 2021-03-29 DIAGNOSIS — C9000 Multiple myeloma not having achieved remission: Secondary | ICD-10-CM | POA: Diagnosis not present

## 2021-03-29 DIAGNOSIS — N189 Chronic kidney disease, unspecified: Secondary | ICD-10-CM | POA: Diagnosis not present

## 2021-03-29 DIAGNOSIS — I129 Hypertensive chronic kidney disease with stage 1 through stage 4 chronic kidney disease, or unspecified chronic kidney disease: Secondary | ICD-10-CM | POA: Diagnosis not present

## 2021-03-29 DIAGNOSIS — I2699 Other pulmonary embolism without acute cor pulmonale: Secondary | ICD-10-CM | POA: Diagnosis not present

## 2021-03-29 DIAGNOSIS — D631 Anemia in chronic kidney disease: Secondary | ICD-10-CM | POA: Insufficient documentation

## 2021-03-29 DIAGNOSIS — E1122 Type 2 diabetes mellitus with diabetic chronic kidney disease: Secondary | ICD-10-CM | POA: Diagnosis not present

## 2021-03-29 DIAGNOSIS — Z452 Encounter for adjustment and management of vascular access device: Secondary | ICD-10-CM | POA: Insufficient documentation

## 2021-03-29 DIAGNOSIS — E538 Deficiency of other specified B group vitamins: Secondary | ICD-10-CM

## 2021-03-29 DIAGNOSIS — E640 Sequelae of protein-calorie malnutrition: Secondary | ICD-10-CM

## 2021-03-29 DIAGNOSIS — R519 Headache, unspecified: Secondary | ICD-10-CM

## 2021-03-29 DIAGNOSIS — R42 Dizziness and giddiness: Secondary | ICD-10-CM

## 2021-03-29 DIAGNOSIS — F05 Delirium due to known physiological condition: Secondary | ICD-10-CM

## 2021-03-29 LAB — CBC WITH DIFFERENTIAL/PLATELET
Abs Immature Granulocytes: 0.01 10*3/uL (ref 0.00–0.07)
Basophils Absolute: 0 10*3/uL (ref 0.0–0.1)
Basophils Relative: 1 %
Eosinophils Absolute: 0.4 10*3/uL (ref 0.0–0.5)
Eosinophils Relative: 12 %
HCT: 26.7 % — ABNORMAL LOW (ref 36.0–46.0)
Hemoglobin: 8.7 g/dL — ABNORMAL LOW (ref 12.0–15.0)
Immature Granulocytes: 0 %
Lymphocytes Relative: 23 %
Lymphs Abs: 0.7 10*3/uL (ref 0.7–4.0)
MCH: 34.8 pg — ABNORMAL HIGH (ref 26.0–34.0)
MCHC: 32.6 g/dL (ref 30.0–36.0)
MCV: 106.8 fL — ABNORMAL HIGH (ref 80.0–100.0)
Monocytes Absolute: 0.5 10*3/uL (ref 0.1–1.0)
Monocytes Relative: 16 %
Neutro Abs: 1.4 10*3/uL — ABNORMAL LOW (ref 1.7–7.7)
Neutrophils Relative %: 48 %
Platelets: 122 10*3/uL — ABNORMAL LOW (ref 150–400)
RBC: 2.5 MIL/uL — ABNORMAL LOW (ref 3.87–5.11)
RDW: 13 % (ref 11.5–15.5)
WBC: 2.9 10*3/uL — ABNORMAL LOW (ref 4.0–10.5)
nRBC: 0 % (ref 0.0–0.2)

## 2021-03-29 LAB — FOLATE: Folate: 28.7 ng/mL (ref >5.9)

## 2021-03-29 LAB — COMPREHENSIVE METABOLIC PANEL
ALT: 16 U/L (ref 0–44)
AST: 14 U/L — ABNORMAL LOW (ref 15–41)
Albumin: 3.6 g/dL (ref 3.5–5.0)
Alkaline Phosphatase: 66 U/L (ref 38–126)
Anion gap: 7 (ref 5–15)
BUN: 28 mg/dL — ABNORMAL HIGH (ref 8–23)
CO2: 26 mmol/L (ref 22–32)
Calcium: 8.4 mg/dL — ABNORMAL LOW (ref 8.9–10.3)
Chloride: 103 mmol/L (ref 98–111)
Creatinine, Ser: 1.74 mg/dL — ABNORMAL HIGH (ref 0.44–1.00)
GFR, Estimated: 31 mL/min — ABNORMAL LOW (ref >60)
Glucose, Bld: 133 mg/dL — ABNORMAL HIGH (ref 70–99)
Potassium: 4 mmol/L (ref 3.5–5.1)
Sodium: 136 mmol/L (ref 135–145)
Total Bilirubin: 0.4 mg/dL (ref 0.3–1.2)
Total Protein: 7.2 g/dL (ref 6.5–8.1)

## 2021-03-29 LAB — IRON AND TIBC
Iron: 35 ug/dL (ref 28–170)
Saturation Ratios: 17 % (ref 10.4–31.8)
TIBC: 207 ug/dL — ABNORMAL LOW (ref 250–450)
UIBC: 172 ug/dL

## 2021-03-29 LAB — LACTATE DEHYDROGENASE: LDH: 115 U/L (ref 98–192)

## 2021-03-29 LAB — FERRITIN: Ferritin: 569 ng/mL — ABNORMAL HIGH (ref 11–307)

## 2021-03-29 LAB — VITAMIN B12: Vitamin B-12: 503 pg/mL (ref 180–914)

## 2021-03-29 MED ORDER — HYDROCODONE-ACETAMINOPHEN 7.5-325 MG PO TABS: 1.0000 | ORAL_TABLET | Freq: Four times a day (QID) | ORAL | 0 refills | Status: DC | PRN

## 2021-03-29 MED ORDER — HEPARIN SOD (PORK) LOCK FLUSH 100 UNIT/ML IV SOLN
500.0000 [IU] | Freq: Once | INTRAVENOUS | Status: AC
  Administered 2021-03-29: 500 [IU] via INTRAVENOUS

## 2021-03-29 MED ORDER — SODIUM CHLORIDE 0.9 % IV SOLN: Freq: Once | INTRAVENOUS | Status: AC

## 2021-03-29 MED ORDER — SODIUM CHLORIDE 0.9% FLUSH
10.0000 mL | Freq: Once | INTRAVENOUS | Status: AC
  Administered 2021-03-29: 10 mL via INTRAVENOUS

## 2021-03-29 NOTE — Progress Notes (Signed)
Patient presents today for port flush/ lab drawn. Patient reports feeling very fatigue, right sided weakness, having frequently headaches and dizziness. Vital signs within normal limits. Labs drawn. Patient's Ser. Creatine 1.74, Dr. Delton Coombes made aware of patient's condition, and gave verbal orders for 572mL of normal saline and for patient to hold Pomalyst at home. A MRI of the brain was also ordered by Dr. Delton Coombes. Patient made aware of orders and verbalized understanding.  Patient tolerated infusion with no complaints voiced. Port flushed with good blood return noted. No bruising or swelling at site. Bandaid applied and patient discharged in satisfactory condition. VVS stable with no signs or symptoms of distressed noted.

## 2021-03-29 NOTE — Patient Instructions (Signed)
[facility]  Discharge Instructions: Thank you for choosing [facility] to provide your oncology and hematology care.  If you have a lab appointment with the [facility], please come in thru the Main Entrance and check in at the main information desk.  Wear comfortable clothing and clothing appropriate for easy access to any Portacath or PICC line.   We strive to give you quality time with your provider. You may need to reschedule your appointment if you arrive late (15 or more minutes).  Arriving late affects you and other patients whose appointments are after yours.  Also, if you miss three or more appointments without notifying the office, you may be dismissed from the clinic at the provider's discretion.      For prescription refill requests, have your pharmacy contact our office and allow 72 hours for refills to be completed.    Today you received hydration and instructed by the oncologist to hold your Pomalyst..    To help prevent nausea and vomiting after your treatment, we encourage you to take your nausea medication as directed.  BELOW ARE SYMPTOMS THAT SHOULD BE REPORTED IMMEDIATELY: *FEVER GREATER THAN 100.4 F (38 C) OR HIGHER *CHILLS OR SWEATING *NAUSEA AND VOMITING THAT IS NOT CONTROLLED WITH YOUR NAUSEA MEDICATION *UNUSUAL SHORTNESS OF BREATH *UNUSUAL BRUISING OR BLEEDING *URINARY PROBLEMS (pain or burning when urinating, or frequent urination) *BOWEL PROBLEMS (unusual diarrhea, constipation, pain near the anus) TENDERNESS IN MOUTH AND THROAT WITH OR WITHOUT PRESENCE OF ULCERS (sore throat, sores in mouth, or a toothache) UNUSUAL RASH, SWELLING OR PAIN  UNUSUAL VAGINAL DISCHARGE OR ITCHING   Items with * indicate a potential emergency and should be followed up as soon as possible or go to the Emergency Department if any problems should occur.  Please show the CHEMOTHERAPY ALERT CARD or IMMUNOTHERAPY ALERT CARD at check-in to the Emergency  Department and triage nurse.  Should you have questions after your visit or need to cancel or reschedule your appointment, please contact [facility] [phone]  and follow the prompts.  Office hours are 8:00 a.m. to 4:30 p.m. Monday - Friday. Please note that voicemails left after 4:00 p.m. may not be returned until the following business day.  We are closed weekends and major holidays. You have access to a nurse at all times for urgent questions. Please call the main number to the clinic [phone] and follow the prompts.  For any non-urgent questions, you may also contact your provider using MyChart. We now offer e-Visits for anyone 80 and older to request care online for non-urgent symptoms. For details visit [email or web address].   Also download the MyChart app! Go to the app store, search "MyChart", open the app, select [facility], and log in with your MyChart username and password.  Due to Covid, a mask is required upon entering the hospital/clinic. If you do not have a mask, one will be given to you upon arrival. For doctor visits, patients may have 1 support person aged 6 or older with them. For treatment visits, patients cannot have anyone with them due to current Covid guidelines and our immunocompromised population.

## 2021-03-30 LAB — KAPPA/LAMBDA LIGHT CHAINS
Kappa free light chain: 64.6 mg/L — ABNORMAL HIGH (ref 3.3–19.4)
Kappa, lambda light chain ratio: 1.4 (ref 0.26–1.65)
Lambda free light chains: 46 mg/L — ABNORMAL HIGH (ref 5.7–26.3)

## 2021-03-31 ENCOUNTER — Other Ambulatory Visit: Payer: Self-pay

## 2021-03-31 ENCOUNTER — Ambulatory Visit (HOSPITAL_COMMUNITY)
Admission: RE | Admit: 2021-03-31 | Discharge: 2021-03-31 | Disposition: A | Discharge status: Home / Self Care | Payer: Medicare Other | Source: Ambulatory Visit | Attending: Hematology | Admitting: Hematology

## 2021-03-31 DIAGNOSIS — C9001 Multiple myeloma in remission: Secondary | ICD-10-CM | POA: Diagnosis not present

## 2021-03-31 DIAGNOSIS — R42 Dizziness and giddiness: Secondary | ICD-10-CM | POA: Insufficient documentation

## 2021-03-31 DIAGNOSIS — F05 Delirium due to known physiological condition: Secondary | ICD-10-CM | POA: Insufficient documentation

## 2021-03-31 DIAGNOSIS — R519 Headache, unspecified: Secondary | ICD-10-CM | POA: Diagnosis present

## 2021-03-31 LAB — PROTEIN ELECTROPHORESIS, SERUM
A/G Ratio: 1.1 (ref 0.7–1.7)
Albumin ELP: 3.3 g/dL (ref 2.9–4.4)
Alpha-1-Globulin: 0.4 g/dL (ref 0.0–0.4)
Alpha-2-Globulin: 1.1 g/dL — ABNORMAL HIGH (ref 0.4–1.0)
Beta Globulin: 0.8 g/dL (ref 0.7–1.3)
Gamma Globulin: 0.8 g/dL (ref 0.4–1.8)
Globulin, Total: 3.1 g/dL (ref 2.2–3.9)
Total Protein ELP: 6.4 g/dL (ref 6.0–8.5)

## 2021-04-05 ENCOUNTER — Inpatient Hospital Stay (HOSPITAL_BASED_OUTPATIENT_CLINIC_OR_DEPARTMENT_OTHER): Payer: Medicare Other | Admitting: Hematology

## 2021-04-05 ENCOUNTER — Other Ambulatory Visit: Payer: Self-pay

## 2021-04-05 VITALS — BP 140/64 | HR 56 | Temp 98.2°F | Resp 18 | Wt 166.2 lb

## 2021-04-05 DIAGNOSIS — E538 Deficiency of other specified B group vitamins: Secondary | ICD-10-CM

## 2021-04-05 DIAGNOSIS — C9001 Multiple myeloma in remission: Secondary | ICD-10-CM | POA: Diagnosis not present

## 2021-04-05 DIAGNOSIS — C9000 Multiple myeloma not having achieved remission: Secondary | ICD-10-CM | POA: Diagnosis not present

## 2021-04-05 NOTE — Progress Notes (Signed)
[place], [place] [address]   CLINIC:  Medical Oncology/Hematology  PCP:  [facility] / [place] [id number] [phone]   REASON FOR VISIT:  Follow-up for multiple myeloma  PRIOR THERAPY:  1. KPD x 4 cycles from 04/17/2018 to 08/21/2018. 2. Stem cell transplant on 10/23/2018. 3. Radiation to left and right humerus 25 Gy in 10 fractions from 08/23/2020 to 08/25/2020.  NGS Results: not done  CURRENT THERAPY: Maintenance Pomalyst 3/4 weeks  BRIEF ONCOLOGIC HISTORY:  Oncology History  Multiple myeloma without remission ([facility])  04/15/2018 Initial Diagnosis   Multiple myeloma without remission ([facility])   04/17/2018 - 09/05/2018 Chemotherapy   The patient had dexamethasone (DECADRON) 4 MG tablet, 1 of 1 cycle, Start date: 04/15/2018, End date: 05/26/2018 palonosetron (ALOXI) injection 0.25 mg, 0.25 mg, Intravenous,  Once, 1 of 1 cycle Administration: 0.25 mg (04/17/2018), 0.25 mg (04/24/2018), 0.25 mg (05/01/2018) cyclophosphamide (CYTOXAN) 540 mg in sodium chloride 0.9 % 250 mL chemo infusion, 300 mg/m2 = 540 mg, Intravenous,  Once, 1 of 1 cycle Administration: 540 mg (04/17/2018), 540 mg (04/24/2018), 540 mg (05/01/2018) carfilzomib (KYPROLIS) 36 mg in dextrose 5 % 50 mL chemo infusion, 20 mg/m2 = 36 mg, Intravenous, Once, 5 of 5 cycles Administration: 36 mg (04/17/2018), 36 mg (04/18/2018), 60 mg (04/24/2018), 60 mg (04/25/2018), 60 mg (05/01/2018), 60 mg (05/02/2018), 60 mg (05/15/2018), 60 mg (05/16/2018), 60 mg (06/12/2018), 60 mg (06/13/2018), 60 mg (06/26/2018), 60 mg (06/27/2018), 60 mg (07/03/2018), 60 mg (07/04/2018), 60 mg (07/24/2018), 60 mg (07/25/2018), 60 mg (07/31/2018), 60 mg (08/01/2018), 60 mg (08/08/2018), 60 mg (08/07/2018), 60 mg (08/21/2018), 60 mg (08/22/2018), 60 mg (08/28/2018), 60 mg (08/29/2018), 60 mg (09/04/2018), 60 mg (09/05/2018)   for chemotherapy treatment.       CANCER STAGING: Cancer Staging No matching  staging information was found for the patient.  INTERVAL HISTORY:  [name], a 75 y.o. female, returns for routine follow-up of her multiple myeloma. [name] was last seen on 01/26/21.   Today she reports feeling well. She reports pain on the right side of her ribs for the past 3 weeks as well as pain in her lower back. There is bruising on her right side although she does not remember an injury to that area. She reports one episode of urinary incontinence. She reports several falls associated with dizziness and light-headedness upon standing which she reports began after starting amlodipine. She denies diarrhea.  REVIEW OF SYSTEMS:  Review of Systems  Constitutional:  Positive for fatigue (50%). Negative for appetite change.  Respiratory:  Positive for cough and shortness of breath.   Cardiovascular:  Positive for chest pain (6/10 R side).  Gastrointestinal:  Negative for diarrhea.  Genitourinary:  Positive for bladder incontinence and difficulty urinating (urgency).   Musculoskeletal:  Positive for back pain (lower).  Neurological:  Positive for dizziness, headaches and light-headedness.  Psychiatric/Behavioral:  Positive for depression and sleep disturbance (falling and staying asleep). The patient is nervous/anxious.   All other systems reviewed and are negative.  PAST MEDICAL/SURGICAL HISTORY:  Past Medical History:  Diagnosis Date   Anxiety    Depression    Hypertension    Multiple myeloma ([place])    multiple myeloma   Past Surgical History:  Procedure Laterality Date   ABDOMINAL HYSTERECTOMY     total   APPENDECTOMY     LAPAROSCOPIC APPENDECTOMY N/A 05/20/2018   Procedure: APPENDECTOMY LAPAROSCOPIC;  Surgeon: Arnoldo Morale,  Elta Guadeloupe, MD;  Location: [facility];  Service: General;  Laterality: N/A;   PORTACATH PLACEMENT Right 04/14/2018   Procedure: INSERTION PORT-A-CATH;  Surgeon: [name], MD;  Location: [facility];  Service: General;  Laterality: Right;    SOCIAL HISTORY:   Social History   Socioeconomic History   Marital status: Legally Separated    Spouse name: Not on file   Number of children: 6   Years of education: Not on file   Highest education level: Not on file  Occupational History    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  Tobacco Use   Smoking status: Never   Smokeless tobacco: Never  Vaping Use   Vaping Use: Never used  Substance and Sexual Activity   Alcohol use: Never   Drug use: Not Currently   Sexual activity: Not Currently  Other Topics Concern   Not on file  Social History Narrative   Not on file   Social Determinants of Health   Financial Resource Strain: Low Risk    Difficulty of Paying Living Expenses: Not hard at all  Food Insecurity: No Food Insecurity   Worried About Charity fundraiser in the Last Year: Never true   [place] in the Last Year: Never true  Transportation Needs: No Transportation Needs   Lack of Transportation (Medical): No   Lack of Transportation (Non-Medical): No  Physical Activity: Inactive   Days of Exercise per Week: 0 days   Minutes of Exercise per Session: 0 min  Stress: No Stress Concern Present   Feeling of Stress : Not at all  Social Connections: Moderately Isolated   Frequency of Communication with Friends and Family: More than three times a week   Frequency of Social Gatherings with Friends and Family: Twice a week   Attends Religious Services: 1 to 4 times per year   Active Member of [employer] or Organizations: No   Attends Archivist Meetings: Never   Marital Status: Separated  Human resources officer Violence: Not At Risk   Fear of Current or Ex-Partner: No   Emotionally Abused: No   Physically Abused: No   Sexually Abused: No    FAMILY HISTORY:  Family History  Problem Relation Age of Onset   Heart disease Mother    Emphysema Father    Diabetes Sister    Depression Sister    Cancer Brother        liver, lung, and colon   Diabetes Brother     CURRENT MEDICATIONS:   Current Outpatient Medications  Medication Sig Dispense Refill   acyclovir (ZOVIRAX) 400 MG tablet Take 1 tablet (400 mg total) by mouth 2 (two) times daily. 60 tablet 4   albuterol (VENTOLIN HFA) 108 (90 Base) MCG/ACT inhaler Inhale 2 puffs into the lungs in the morning, at noon, in the evening, and at bedtime. 8 g 0   ALPRAZolam (XANAX) 0.5 MG tablet Take one tablet during the day as needed for anxiety and two tablets at bedtime as needed for anxiety/insomnia 90 tablet 3   amLODipine (NORVASC) 5 MG tablet Take 1 tablet by mouth daily.     benzonatate (TESSALON) 100 MG capsule Take 1 capsule (100 mg total) by mouth 3 (three) times daily. 20 capsule 0   Calcium Carb-Cholecalciferol (CALCIUM 1000 + D PO) Take 1,000 mg by mouth daily. Take one tablet daily until next visit with oncologist.     citalopram (CELEXA) 40 MG tablet Take 1 tablet (40 mg total) by mouth daily. 90 tablet  3   ELIQUIS 5 MG TABS tablet Take 5 mg by mouth 2 (two) times daily.     HYDROcodone-acetaminophen (NORCO) 7.5-325 MG tablet Take 1 tablet by mouth every 6 (six) hours as needed for moderate pain. 120 tablet 0   loperamide (IMODIUM) 2 MG capsule Take 1 capsule (2 mg total) by mouth as needed for diarrhea or loose stools. 30 capsule 0   Multiple Vitamin (THERA) TABS Take 1 tablet by mouth daily. 30 tablet 5   ondansetron (ZOFRAN) 4 MG tablet Take 1 tablet (4 mg total) by mouth every 8 (eight) hours as needed for nausea or vomiting. 20 tablet 1   pantoprazole (PROTONIX) 40 MG tablet Take 1 tablet (40 mg total) by mouth daily. 90 tablet 3   POMALYST 2 MG capsule TAKE 1 CAPSULE BY MOUTH ONCE DAILY 21 capsule 1   rosuvastatin (CRESTOR) 10 MG tablet Take 10 mg by mouth daily.     saccharomyces boulardii (FLORASTOR) 250 MG capsule Take 1 capsule (250 mg total) by mouth 2 (two) times daily. 20 capsule 0   terbinafine (LAMISIL) 1 % cream Apply 1 application topically 2 (two) times daily. 30 g 0   zolpidem (AMBIEN) 10 MG tablet Take  1 tablet (10 mg total) by mouth at bedtime as needed for sleep. (Patient not taking: Reported on 04/05/2021) 30 tablet 2   No current facility-administered medications for this visit.    ALLERGIES:  Allergies  Allergen Reactions   Ciprofloxacin Anaphylaxis   Amoxicillin    Morphine    Morphine And Related Nausea And Vomiting   Augmentin [Amoxicillin-Pot Clavulanate] Other (See Comments)    Headache, insomnia    PHYSICAL EXAM:  Performance status (ECOG): 1 - Symptomatic but completely ambulatory  Vitals:   04/05/21 1435  BP: 140/64  Pulse: (!) 56  Resp: 18  Temp: 98.2 F (36.8 C)  SpO2: 96%   Wt Readings from Last 3 Encounters:  04/05/21 166 lb 3.2 oz (75.4 kg)  03/29/21 166 lb 12.8 oz (75.7 kg)  01/26/21 167 lb 4.8 oz (75.9 kg)   Physical Exam Vitals reviewed.  Constitutional:      Appearance: Normal appearance.  Cardiovascular:     Rate and Rhythm: Normal rate and regular rhythm.     Pulses: Normal pulses.     Heart sounds: Normal heart sounds.  Pulmonary:     Effort: Pulmonary effort is normal.     Breath sounds: Normal breath sounds.  Neurological:     General: No focal deficit present.     Mental Status: She is alert and oriented to person, place, and time.  Psychiatric:        Mood and Affect: Mood normal.        Behavior: Behavior normal.     LABORATORY DATA:  I have reviewed the labs as listed.  CBC Latest Ref Rng & Units 03/29/2021 02/07/2021 01/19/2021  WBC 4.0 - 10.5 K/uL 2.9(L) 2.1(L) 2.6(L)  Hemoglobin 12.0 - 15.0 g/dL 8.7(L) 9.1(L) 9.5(L)  Hematocrit 36.0 - 46.0 % 26.7(L) 28.4(L) 29.9(L)  Platelets 150 - 400 K/uL 122(L) 109(L) 132(L)   CMP Latest Ref Rng & Units 03/29/2021 02/07/2021 01/19/2021  Glucose 70 - 99 mg/dL 133(H) 126(H) 149(H)  BUN 8 - 23 mg/dL 28(H) 23 21  Creatinine 0.44 - 1.00 mg/dL 1.74(H) 1.49(H) 1.50(H)  Sodium 135 - 145 mmol/L 136 137 136  Potassium 3.5 - 5.1 mmol/L 4.0 4.0 4.3  Chloride 98 - 111 mmol/L 103 102 101  CO2  22 - 32  mmol/L _0 Calcium 8.9 - 10.3 mg/dL 8.4(L) 8.9 9.0  Total Protein 6.5 - 8.1 g/dL 7.2 6.3(L) 6.3(L)  Total Bilirubin 0.3 - 1.2 mg/dL 0.4 0.5 0.7  Alkaline Phos 38 - 126 U/L 66 54 54  AST 15 - 41 U/L 14(L) 15 18  ALT 0 - 44 U/L _1 DIAGNOSTIC IMAGING:  I have independently reviewed the scans and discussed with the patient. DG Ribs Unilateral Right  Result Date: 03/28/2021 CLINICAL DATA:  Fall and right hip pain. EXAM: RIGHT RIBS - 2 VIEW COMPARISON:  Chest radiograph dated 10/13/2020. FINDINGS: No acute fracture identified. The bones are osteopenic. There is degenerative changes of the spine. Right-sided Port-A-Cath with tip close to the cavoatrial junction. The soft tissues are unremarkable. Atherosclerotic calcification of the aortic arch. IMPRESSION: No acute right rib fracture. Electronically Signed   By: Anner Crete M.D.   On: 03/28/2021 16:36   MR Brain Wo Contrast  Result Date: 03/31/2021 CLINICAL DATA:  Multiple myeloma in remission ([place]) C90.01 (ICD-10-CM). Dizziness, non-specific. Headache, new or worsening (Age >= 50y). Multiple myeloma with headaches, dizziness and confusion. Persistent headaches R51.9 (ICD-10-CM). Dizziness R42 (ICD-10-CM). Acute confusional state F05 (ICD-10-CM). Additional history provided by scanning technologist: Patient reports persistent headaches, dizziness. EXAM: MRI HEAD WITHOUT CONTRAST TECHNIQUE: Multiplanar, multiecho pulse sequences of the brain and surrounding structures were obtained without intravenous contrast. COMPARISON:  PET-CT 08/23/2020. FINDINGS: Brain: Cerebral volume is normal for age. Mild multifocal T2/FLAIR hyperintensity within the cerebral white matter, nonspecific but compatible with chronic small vessel ischemic disease. There is no acute infarct. No evidence of an intracranial mass. No chronic intracranial blood products. No extra-axial fluid collection. No midline shift. Partially empty sella turcica. Vascular:  Maintained flow voids within the proximal large arterial vessels. Skull and upper cervical spine: Multiple T2/FLAIR hyperintense and T1 hypointense calvarial lesions, compatible with sequela of multiple myeloma given the provided history. The largest lesions are located within the left frontal calvarium (15 mm) and right parietal calvarium (14 mm). Sinuses/Orbits: Visualized orbits show no acute finding. No significant paranasal sinus disease at the imaged levels. IMPRESSION: No evidence of acute intracranial abnormality. Mild chronic small vessel ischemic changes within the cerebral white matter. Multiple calvarial lesions measuring up to 15 mm, compatible with sequela of multiple myeloma given the provided history. Electronically Signed   By: Kellie Simmering D.O.   On: 03/31/2021 16:47     ASSESSMENT:  1.  IgG lambda plasma cell myeloma, stage II, standard risk: -4 cycles of KPD from 04/17/2018 through 08/21/2018, stem cell transplant on 10/23/2018. -PET scan on 01/21/2019 showed multiple bone lesions but without any hypermetabolic activity. -BMBX on 01/21/2019 with normocellular marrow with no increase in plasma cells.  Normal FISH.  MRD results negative. -Maintenance pomalidomide 2 mg 3 weeks on/1 week off started on 03/10/2019. -Bone marrow biopsy on 10/29/2019 shows trilineage hematopoiesis with no evidence of plasma cells.  Chromosome analysis and FISH are normal. -BM BX on 10/27/2020 at [facility] with 30 to 40%, TLH.  2% of the total cells are CD 138+ plasma cells.   2.  Pulmonary embolism: -CT angiogram on 04/21/2020 showed filling defect at [facility]. -VQ scan confirmed pulmonary embolism. -She is on Eliquis.  3.  Right shoulder and upper arm pain: - She received XRT to the chest from 12/26/2020 through 01/06/2021.   PLAN:  1.  IgG lambda plasma cell myeloma: - She was  taking pomalidomide 2 mg 3 weeks on/1 week off as maintenance. - We reviewed labs from 01/04/2021  which showed M spike was negative.  Free light chain ratio was 1.4.  Kappa light chains are 64. - We held her Pomalyst on 03/29/2021 as she was complaining of dizziness and had a fall. - We reviewed right lateral rib series on 03/27/2021 which did not show any fractures. - We also reviewed MRI of the brain without contrast from 03/31/2021 which showed no evidence of intracranial lesions.  Multiple bone lesions in the skull compatible with sequela of multiple myeloma. - We checked her orthostatics in the office which were negative.  She was also told to hold her Norvasc.  She was told to take Norvasc 2.5 mg if SBP more than 150. - She was evaluated by Dr. Yong Channel who discontinued her diabetes medication. - I have recommended follow-up in 3 weeks.  We will hold pomalidomide until then.  I plan to check her CBC, CMP, M21, folic acid and TSH levels along with cortisol at next visit.   2.  Back pain: -She has chronic back pain which is stable.   3.  Myeloma bone disease: - Delton See was held due to exposure of bone in the right lower jaw. - Continue calcium and vitamin D supplements.   4.  Anxiety: - Continue Xanax twice daily as needed.   5.  Sleeping difficulty: - Continue Ambien 10 mg at bedtime which is helping.   6.  Normocytic anemia: - Combination anemia from CKD, myelosuppression. - Hemoglobin on 03/29/2021 was 8.7.  Ferritin was 569 and percent saturation 17.  J15 and folic acid was normal.   7.  Pulmonary embolism: - Continue Eliquis.  No bleeding issues.      Orders placed this encounter:  No orders of the defined types were placed in this encounter.    [name], MD [facility] [phone]   I, Thana Ates, am acting as a scribe for Dr. [name].  I, [name] MD, have reviewed the above documentation for accuracy and completeness, and I agree with the above.

## 2021-04-05 NOTE — Patient Instructions (Addendum)
[facility] at [facility] Discharge Instructions  You were seen today by Dr. Katragadda. He went over your recent results and scans. Dr. Katragadda will see you back in 3 weeks for labs and follow up.   Thank you for choosing [facility] at [facility] to provide your oncology and hematology care.  To afford each patient quality time with our provider, please arrive at least 15 minutes before your scheduled appointment time.   If you have a lab appointment with the Cancer Center please come in thru the Main Entrance and check in at the main information desk  You need to re-schedule your appointment should you arrive 10 or more minutes late.  We strive to give you quality time with our providers, and arriving late affects you and other patients whose appointments are after yours.  Also, if you no show three or more times for appointments you may be dismissed from the clinic at the providers discretion.     Again, thank you for choosing [facility].  Our hope is that these requests will decrease the amount of time that you wait before being seen by our physicians.       _____________________________________________________________  Should you have questions after your visit to [facility], please contact our office at [phone] between the hours of 8:00 a.m. and 4:30 p.m.  Voicemails left after 4:00 p.m. will not be returned until the following business day.  For prescription refill requests, have your pharmacy contact our office and allow 72 hours.    Cancer Center Support Programs:   > Cancer Support Group  2nd Tuesday of the month 1pm-2pm, Journey Room    

## 2021-04-11 ENCOUNTER — Other Ambulatory Visit (HOSPITAL_COMMUNITY): Payer: Self-pay

## 2021-04-11 DIAGNOSIS — F419 Anxiety disorder, unspecified: Secondary | ICD-10-CM

## 2021-04-11 DIAGNOSIS — C9000 Multiple myeloma not having achieved remission: Secondary | ICD-10-CM

## 2021-04-11 MED ORDER — ALPRAZOLAM 0.5 MG PO TABS: ORAL_TABLET | ORAL | 3 refills | Status: DC

## 2021-04-19 ENCOUNTER — Other Ambulatory Visit (HOSPITAL_COMMUNITY): Payer: Self-pay

## 2021-04-19 DIAGNOSIS — C9001 Multiple myeloma in remission: Secondary | ICD-10-CM

## 2021-04-19 DIAGNOSIS — D528 Other folate deficiency anemias: Secondary | ICD-10-CM

## 2021-04-19 DIAGNOSIS — E46 Unspecified protein-calorie malnutrition: Secondary | ICD-10-CM

## 2021-04-20 ENCOUNTER — Encounter (HOSPITAL_COMMUNITY): Payer: Self-pay

## 2021-04-20 ENCOUNTER — Inpatient Hospital Stay (HOSPITAL_COMMUNITY): Payer: Medicare Other | Attending: Hematology

## 2021-04-20 ENCOUNTER — Other Ambulatory Visit: Payer: Self-pay

## 2021-04-20 DIAGNOSIS — N189 Chronic kidney disease, unspecified: Secondary | ICD-10-CM | POA: Insufficient documentation

## 2021-04-20 DIAGNOSIS — C9000 Multiple myeloma not having achieved remission: Secondary | ICD-10-CM | POA: Diagnosis present

## 2021-04-20 DIAGNOSIS — D631 Anemia in chronic kidney disease: Secondary | ICD-10-CM | POA: Insufficient documentation

## 2021-04-20 DIAGNOSIS — E46 Unspecified protein-calorie malnutrition: Secondary | ICD-10-CM

## 2021-04-20 DIAGNOSIS — R42 Dizziness and giddiness: Secondary | ICD-10-CM | POA: Insufficient documentation

## 2021-04-20 DIAGNOSIS — Z452 Encounter for adjustment and management of vascular access device: Secondary | ICD-10-CM | POA: Insufficient documentation

## 2021-04-20 DIAGNOSIS — I129 Hypertensive chronic kidney disease with stage 1 through stage 4 chronic kidney disease, or unspecified chronic kidney disease: Secondary | ICD-10-CM | POA: Insufficient documentation

## 2021-04-20 DIAGNOSIS — C9001 Multiple myeloma in remission: Secondary | ICD-10-CM

## 2021-04-20 DIAGNOSIS — Z79899 Other long term (current) drug therapy: Secondary | ICD-10-CM | POA: Diagnosis not present

## 2021-04-20 DIAGNOSIS — D528 Other folate deficiency anemias: Secondary | ICD-10-CM

## 2021-04-20 DIAGNOSIS — I2699 Other pulmonary embolism without acute cor pulmonale: Secondary | ICD-10-CM | POA: Diagnosis not present

## 2021-04-20 DIAGNOSIS — Z7901 Long term (current) use of anticoagulants: Secondary | ICD-10-CM | POA: Diagnosis not present

## 2021-04-20 LAB — COMPREHENSIVE METABOLIC PANEL
ALT: 15 U/L (ref 0–44)
AST: 16 U/L (ref 15–41)
Albumin: 3.8 g/dL (ref 3.5–5.0)
Alkaline Phosphatase: 70 U/L (ref 38–126)
Anion gap: 2 — ABNORMAL LOW (ref 5–15)
BUN: 27 mg/dL — ABNORMAL HIGH (ref 8–23)
CO2: 28 mmol/L (ref 22–32)
Calcium: 8.3 mg/dL — ABNORMAL LOW (ref 8.9–10.3)
Chloride: 106 mmol/L (ref 98–111)
Creatinine, Ser: 1.55 mg/dL — ABNORMAL HIGH (ref 0.44–1.00)
GFR, Estimated: 35 mL/min — ABNORMAL LOW (ref >60)
Glucose, Bld: 152 mg/dL — ABNORMAL HIGH (ref 70–99)
Potassium: 3.9 mmol/L (ref 3.5–5.1)
Sodium: 136 mmol/L (ref 135–145)
Total Bilirubin: 0.5 mg/dL (ref 0.3–1.2)
Total Protein: 6.6 g/dL (ref 6.5–8.1)

## 2021-04-20 LAB — MAGNESIUM: Magnesium: 1.9 mg/dL (ref 1.7–2.4)

## 2021-04-20 LAB — CBC WITH DIFFERENTIAL/PLATELET
Abs Immature Granulocytes: 0.01 10*3/uL (ref 0.00–0.07)
Basophils Absolute: 0 10*3/uL (ref 0.0–0.1)
Basophils Relative: 1 %
Eosinophils Absolute: 0.2 10*3/uL (ref 0.0–0.5)
Eosinophils Relative: 7 %
HCT: 28.9 % — ABNORMAL LOW (ref 36.0–46.0)
Hemoglobin: 9.2 g/dL — ABNORMAL LOW (ref 12.0–15.0)
Immature Granulocytes: 0 %
Lymphocytes Relative: 27 %
Lymphs Abs: 0.7 10*3/uL (ref 0.7–4.0)
MCH: 34.3 pg — ABNORMAL HIGH (ref 26.0–34.0)
MCHC: 31.8 g/dL (ref 30.0–36.0)
MCV: 107.8 fL — ABNORMAL HIGH (ref 80.0–100.0)
Monocytes Absolute: 0.3 10*3/uL (ref 0.1–1.0)
Monocytes Relative: 10 %
Neutro Abs: 1.5 10*3/uL — ABNORMAL LOW (ref 1.7–7.7)
Neutrophils Relative %: 55 %
Platelets: 132 10*3/uL — ABNORMAL LOW (ref 150–400)
RBC: 2.68 MIL/uL — ABNORMAL LOW (ref 3.87–5.11)
RDW: 13.4 % (ref 11.5–15.5)
WBC: 2.7 10*3/uL — ABNORMAL LOW (ref 4.0–10.5)
nRBC: 0 % (ref 0.0–0.2)

## 2021-04-20 LAB — TSH: TSH: 2.095 u[IU]/mL (ref 0.350–4.500)

## 2021-04-20 LAB — LACTATE DEHYDROGENASE: LDH: 111 U/L (ref 98–192)

## 2021-04-20 LAB — VITAMIN B12: Vitamin B-12: 534 pg/mL (ref 180–914)

## 2021-04-20 LAB — CORTISOL: Cortisol, Plasma: 12.6 ug/dL

## 2021-04-20 LAB — FOLATE: Folate: 31.7 ng/mL (ref >5.9)

## 2021-04-20 MED ORDER — SODIUM CHLORIDE 0.9% FLUSH
10.0000 mL | Freq: Once | INTRAVENOUS | Status: AC
  Administered 2021-04-20: 10 mL via INTRAVENOUS

## 2021-04-20 MED ORDER — HEPARIN SOD (PORK) LOCK FLUSH 100 UNIT/ML IV SOLN
500.0000 [IU] | Freq: Once | INTRAVENOUS | Status: AC
  Administered 2021-04-20: 500 [IU] via INTRAVENOUS

## 2021-04-20 NOTE — Progress Notes (Signed)
Labs drawn from port.  Patients port flushed without difficulty.  Good blood return noted with no bruising or swelling noted at site.  Band aid applied.  VSS with discharge and left in satisfactory condition with no s/s of distress noted.  

## 2021-04-20 NOTE — Patient Instructions (Signed)
[facility]  Discharge Instructions: Thank you for choosing [facility] to provide your oncology and hematology care.  If you have a lab appointment with the [facility], please come in thru the Main Entrance and check in at the main information desk.  Wear comfortable clothing and clothing appropriate for easy access to any Portacath or PICC line.   We strive to give you quality time with your provider. You may need to reschedule your appointment if you arrive late (15 or more minutes).  Arriving late affects you and other patients whose appointments are after yours.  Also, if you miss three or more appointments without notifying the office, you may be dismissed from the clinic at the provider's discretion.      For prescription refill requests, have your pharmacy contact our office and allow 72 hours for refills to be completed.    Today your labs were drawn from your port.    To help prevent nausea and vomiting after your treatment, we encourage you to take your nausea medication as directed.  BELOW ARE SYMPTOMS THAT SHOULD BE REPORTED IMMEDIATELY: *FEVER GREATER THAN 100.4 F (38 C) OR HIGHER *CHILLS OR SWEATING *NAUSEA AND VOMITING THAT IS NOT CONTROLLED WITH YOUR NAUSEA MEDICATION *UNUSUAL SHORTNESS OF BREATH *UNUSUAL BRUISING OR BLEEDING *URINARY PROBLEMS (pain or burning when urinating, or frequent urination) *BOWEL PROBLEMS (unusual diarrhea, constipation, pain near the anus) TENDERNESS IN MOUTH AND THROAT WITH OR WITHOUT PRESENCE OF ULCERS (sore throat, sores in mouth, or a toothache) UNUSUAL RASH, SWELLING OR PAIN  UNUSUAL VAGINAL DISCHARGE OR ITCHING   Items with * indicate a potential emergency and should be followed up as soon as possible or go to the Emergency Department if any problems should occur.  Please show the CHEMOTHERAPY ALERT CARD or IMMUNOTHERAPY ALERT CARD at check-in to the Emergency Department and triage nurse.  Should you have  questions after your visit or need to cancel or reschedule your appointment, please contact [facility] [phone]  and follow the prompts.  Office hours are 8:00 a.m. to 4:30 p.m. Monday - Friday. Please note that voicemails left after 4:00 p.m. may not be returned until the following business day.  We are closed weekends and major holidays. You have access to a nurse at all times for urgent questions. Please call the main number to the clinic [phone] and follow the prompts.  For any non-urgent questions, you may also contact your provider using MyChart. We now offer e-Visits for anyone 72 and older to request care online for non-urgent symptoms. For details visit [email or web address].   Also download the MyChart app! Go to the app store, search "MyChart", open the app, select [facility], and log in with your MyChart username and password.  Due to Covid, a mask is required upon entering the hospital/clinic. If you do not have a mask, one will be given to you upon arrival. For doctor visits, patients may have 1 support person aged 59 or older with them. For treatment visits, patients cannot have anyone with them due to current Covid guidelines and our immunocompromised population.

## 2021-04-21 LAB — KAPPA/LAMBDA LIGHT CHAINS
Kappa free light chain: 29.1 mg/L — ABNORMAL HIGH (ref 3.3–19.4)
Kappa, lambda light chain ratio: 1.07 (ref 0.26–1.65)
Lambda free light chains: 27.2 mg/L — ABNORMAL HIGH (ref 5.7–26.3)

## 2021-04-25 ENCOUNTER — Other Ambulatory Visit (HOSPITAL_COMMUNITY): Payer: Self-pay

## 2021-04-25 LAB — IMMUNOFIXATION ELECTROPHORESIS
IgA: 92 mg/dL (ref 64–422)
IgG (Immunoglobin G), Serum: 861 mg/dL (ref 586–1602)
IgM (Immunoglobulin M), Srm: 32 mg/dL (ref 26–217)
Total Protein ELP: 6.1 g/dL (ref 6.0–8.5)

## 2021-04-25 NOTE — Progress Notes (Signed)
[place], [place] [address]   CLINIC:  Medical Oncology/Hematology  PCP:  [facility] / [place] [address] [phone]   REASON FOR VISIT:  Follow-up for multiple myeloma  PRIOR THERAPY:  1. KPD x 4 cycles from 04/17/2018 to 08/21/2018. 2. Stem cell transplant on 10/23/2018. 3. Radiation to left and right humerus 25 Gy in 10 fractions from 08/23/2020 to 08/25/2020.  NGS Results: not done  CURRENT THERAPY: Maintenance Pomalyst 3/4 weeks  BRIEF ONCOLOGIC HISTORY:  Oncology History  Multiple myeloma without remission ([place])  04/15/2018 Initial Diagnosis   Multiple myeloma without remission ([place])   04/17/2018 - 09/05/2018 Chemotherapy   The patient had dexamethasone (DECADRON) 4 MG tablet, 1 of 1 cycle, Start date: 04/15/2018, End date: 05/26/2018 palonosetron (ALOXI) injection 0.25 mg, 0.25 mg, Intravenous,  Once, 1 of 1 cycle Administration: 0.25 mg (04/17/2018), 0.25 mg (04/24/2018), 0.25 mg (05/01/2018) cyclophosphamide (CYTOXAN) 540 mg in sodium chloride 0.9 % 250 mL chemo infusion, 300 mg/m2 = 540 mg, Intravenous,  Once, 1 of 1 cycle Administration: 540 mg (04/17/2018), 540 mg (04/24/2018), 540 mg (05/01/2018) carfilzomib (KYPROLIS) 36 mg in dextrose 5 % 50 mL chemo infusion, 20 mg/m2 = 36 mg, Intravenous, Once, 5 of 5 cycles Administration: 36 mg (04/17/2018), 36 mg (04/18/2018), 60 mg (04/24/2018), 60 mg (04/25/2018), 60 mg (05/01/2018), 60 mg (05/02/2018), 60 mg (05/15/2018), 60 mg (05/16/2018), 60 mg (06/12/2018), 60 mg (06/13/2018), 60 mg (06/26/2018), 60 mg (06/27/2018), 60 mg (07/03/2018), 60 mg (07/04/2018), 60 mg (07/24/2018), 60 mg (07/25/2018), 60 mg (07/31/2018), 60 mg (08/01/2018), 60 mg (08/08/2018), 60 mg (08/07/2018), 60 mg (08/21/2018), 60 mg (08/22/2018), 60 mg (08/28/2018), 60 mg (08/29/2018), 60 mg (09/04/2018), 60 mg (09/05/2018)   for chemotherapy treatment.       CANCER STAGING: Cancer Staging No matching  staging information was found for the patient.  INTERVAL HISTORY:  [name], a 75 y.o. female, returns for routine follow-up of her multiple myeloma. Alexandra Price was last seen on 04/05/2021.   Today she reports feeling good. She has not taken Pomalyst for a month, and her dizziness has improved. She is no longer taking Norvasc. She denies hematuria and hematochezia. She denies n/v, and she reports back pain. She reports pain in the right side of her chest.  REVIEW OF SYSTEMS:  Review of Systems  Constitutional:  Negative for appetite change and fatigue (75%).  Respiratory:  Positive for cough and shortness of breath.   Cardiovascular:  Positive for chest pain (5/10 R side).  Gastrointestinal:  Negative for nausea and vomiting.  Musculoskeletal:  Positive for arthralgias (5/10 L shoulder) and back pain (5/10 back and neck).  Neurological:  Positive for dizziness (improved), headaches and numbness (L shin).  Psychiatric/Behavioral:  Positive for depression and sleep disturbance. The patient is nervous/anxious.   All other systems reviewed and are negative.  PAST MEDICAL/SURGICAL HISTORY:  Past Medical History:  Diagnosis Date   Anxiety    Depression    Hypertension    Multiple myeloma ([place])    multiple myeloma   Past Surgical History:  Procedure Laterality Date   ABDOMINAL HYSTERECTOMY     total   APPENDECTOMY     LAPAROSCOPIC APPENDECTOMY N/A 05/20/2018   Procedure: APPENDECTOMY LAPAROSCOPIC;  Surgeon: [name], MD;  Location: [facility];  Service: General;  Laterality: N/A;   PORTACATH PLACEMENT Right 04/14/2018   Procedure: INSERTION PORT-A-CATH;  Surgeon: [name], MD;  Location:  [facility];  Service: General;  Laterality: Right;    SOCIAL HISTORY:  Social History   Socioeconomic History   Marital status: Legally Separated    Spouse name: Not on file   Number of children: 6   Years of education: Not on file   Highest education level: Not on file  Occupational  History    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  Tobacco Use   Smoking status: Never   Smokeless tobacco: Never  Vaping Use   Vaping Use: Never used  Substance and Sexual Activity   Alcohol use: Never   Drug use: Not Currently   Sexual activity: Not Currently  Other Topics Concern   Not on file  Social History Narrative   Not on file   Social Determinants of Health   Financial Resource Strain: Low Risk    Difficulty of Paying Living Expenses: Not hard at all  Food Insecurity: No Food Insecurity   Worried About Charity fundraiser in the Last Year: Never true   [place] in the Last Year: Never true  Transportation Needs: No Transportation Needs   Lack of Transportation (Medical): No   Lack of Transportation (Non-Medical): No  Physical Activity: Inactive   Days of Exercise per Week: 0 days   Minutes of Exercise per Session: 0 min  Stress: No Stress Concern Present   Feeling of Stress : Not at all  Social Connections: Moderately Isolated   Frequency of Communication with Friends and Family: More than three times a week   Frequency of Social Gatherings with Friends and Family: Twice a week   Attends Religious Services: 1 to 4 times per year   Active Member of [employer] or Organizations: No   Attends Archivist Meetings: Never   Marital Status: Separated  Human resources officer Violence: Not At Risk   Fear of Current or Ex-Partner: No   Emotionally Abused: No   Physically Abused: No   Sexually Abused: No    FAMILY HISTORY:  Family History  Problem Relation Age of Onset   Heart disease Mother    Emphysema Father    Diabetes Sister    Depression Sister    Cancer Brother        liver, lung, and colon   Diabetes Brother     CURRENT MEDICATIONS:  Current Outpatient Medications  Medication Sig Dispense Refill   acyclovir (ZOVIRAX) 400 MG tablet Take 1 tablet (400 mg total) by mouth 2 (two) times daily. 60 tablet 4   albuterol (VENTOLIN HFA) 108 (90 Base) MCG/ACT  inhaler Inhale 2 puffs into the lungs in the morning, at noon, in the evening, and at bedtime. 8 g 0   ALPRAZolam (XANAX) 0.5 MG tablet Take one tablet during the day as needed for anxiety and two tablets at bedtime as needed for anxiety/insomnia 90 tablet 3   amLODipine (NORVASC) 5 MG tablet Take 1 tablet by mouth daily.     benzonatate (TESSALON) 100 MG capsule Take 1 capsule (100 mg total) by mouth 3 (three) times daily. 20 capsule 0   Calcium Carb-Cholecalciferol (CALCIUM 1000 + D PO) Take 1,000 mg by mouth daily. Take one tablet daily until next visit with oncologist.     citalopram (CELEXA) 40 MG tablet Take 1 tablet (40 mg total) by mouth daily. 90 tablet 3   ELIQUIS 5 MG TABS tablet Take 5 mg by mouth 2 (two) times daily.     HYDROcodone-acetaminophen (NORCO) 7.5-325 MG tablet Take 1 tablet by  mouth every 6 (six) hours as needed for moderate pain. 120 tablet 0   loperamide (IMODIUM) 2 MG capsule Take 1 capsule (2 mg total) by mouth as needed for diarrhea or loose stools. 30 capsule 0   Multiple Vitamin (THERA) TABS Take 1 tablet by mouth daily. 30 tablet 5   ondansetron (ZOFRAN) 4 MG tablet Take 1 tablet (4 mg total) by mouth every 8 (eight) hours as needed for nausea or vomiting. 20 tablet 1   pantoprazole (PROTONIX) 40 MG tablet Take 1 tablet (40 mg total) by mouth daily. 90 tablet 3   POMALYST 2 MG capsule TAKE 1 CAPSULE BY MOUTH ONCE DAILY 21 capsule 1   rosuvastatin (CRESTOR) 10 MG tablet Take 10 mg by mouth daily.     saccharomyces boulardii (FLORASTOR) 250 MG capsule Take 1 capsule (250 mg total) by mouth 2 (two) times daily. 20 capsule 0   terbinafine (LAMISIL) 1 % cream Apply 1 application topically 2 (two) times daily. 30 g 0   zolpidem (AMBIEN) 10 MG tablet Take 1 tablet (10 mg total) by mouth at bedtime as needed for sleep. 30 tablet 2   No current facility-administered medications for this visit.    ALLERGIES:  Allergies  Allergen Reactions   Ciprofloxacin Anaphylaxis    Amoxicillin    Morphine    Morphine And Related Nausea And Vomiting   Augmentin [Amoxicillin-Pot Clavulanate] Other (See Comments)    Headache, insomnia    PHYSICAL EXAM:  Performance status (ECOG): 1 - Symptomatic but completely ambulatory  There were no vitals filed for this visit. Wt Readings from Last 3 Encounters:  04/05/21 166 lb 3.2 oz (75.4 kg)  03/29/21 166 lb 12.8 oz (75.7 kg)  01/26/21 167 lb 4.8 oz (75.9 kg)   Physical Exam   LABORATORY DATA:  I have reviewed the labs as listed.  CBC Latest Ref Rng & Units 04/20/2021 03/29/2021 02/07/2021  WBC 4.0 - 10.5 K/uL 2.7(L) 2.9(L) 2.1(L)  Hemoglobin 12.0 - 15.0 g/dL 9.2(L) 8.7(L) 9.1(L)  Hematocrit 36.0 - 46.0 % 28.9(L) 26.7(L) 28.4(L)  Platelets 150 - 400 K/uL 132(L) 122(L) 109(L)   CMP Latest Ref Rng & Units 04/20/2021 03/29/2021 02/07/2021  Glucose 70 - 99 mg/dL 152(H) 133(H) 126(H)  BUN 8 - 23 mg/dL 27(H) 28(H) 23  Creatinine 0.44 - 1.00 mg/dL 1.55(H) 1.74(H) 1.49(H)  Sodium 135 - 145 mmol/L 136 136 137  Potassium 3.5 - 5.1 mmol/L 3.9 4.0 4.0  Chloride 98 - 111 mmol/L 106 103 102  CO2 22 - 32 mmol/L 28 26 28   Calcium 8.9 - 10.3 mg/dL 8.3(L) 8.4(L) 8.9  Total Protein 6.5 - 8.1 g/dL 6.6 7.2 6.3(L)  Total Bilirubin 0.3 - 1.2 mg/dL 0.5 0.4 0.5  Alkaline Phos 38 - 126 U/L 70 66 54  AST 15 - 41 U/L 16 14(L) 15  ALT 0 - 44 U/L 15 16 13     DIAGNOSTIC IMAGING:  I have independently reviewed the scans and discussed with the patient. DG Ribs Unilateral Right  Result Date: 03/28/2021 CLINICAL DATA:  Fall and right hip pain. EXAM: RIGHT RIBS - 2 VIEW COMPARISON:  Chest radiograph dated 10/13/2020. FINDINGS: No acute fracture identified. The bones are osteopenic. There is degenerative changes of the spine. Right-sided Port-A-Cath with tip close to the cavoatrial junction. The soft tissues are unremarkable. Atherosclerotic calcification of the aortic arch. IMPRESSION: No acute right rib fracture. Electronically Signed   By: Anner Crete M.D.   On: 03/28/2021 16:36   MR  Brain Wo Contrast  Result Date: 03/31/2021 CLINICAL DATA:  Multiple myeloma in remission ([place]) C90.01 (ICD-10-CM). Dizziness, non-specific. Headache, new or worsening (Age >= 50y). Multiple myeloma with headaches, dizziness and confusion. Persistent headaches R51.9 (ICD-10-CM). Dizziness R42 (ICD-10-CM). Acute confusional state F05 (ICD-10-CM). Additional history provided by scanning technologist: Patient reports persistent headaches, dizziness. EXAM: MRI HEAD WITHOUT CONTRAST TECHNIQUE: Multiplanar, multiecho pulse sequences of the brain and surrounding structures were obtained without intravenous contrast. COMPARISON:  PET-CT 08/23/2020. FINDINGS: Brain: Cerebral volume is normal for age. Mild multifocal T2/FLAIR hyperintensity within the cerebral white matter, nonspecific but compatible with chronic small vessel ischemic disease. There is no acute infarct. No evidence of an intracranial mass. No chronic intracranial blood products. No extra-axial fluid collection. No midline shift. Partially empty sella turcica. Vascular: Maintained flow voids within the proximal large arterial vessels. Skull and upper cervical spine: Multiple T2/FLAIR hyperintense and T1 hypointense calvarial lesions, compatible with sequela of multiple myeloma given the provided history. The largest lesions are located within the left frontal calvarium (15 mm) and right parietal calvarium (14 mm). Sinuses/Orbits: Visualized orbits show no acute finding. No significant paranasal sinus disease at the imaged levels. IMPRESSION: No evidence of acute intracranial abnormality. Mild chronic small vessel ischemic changes within the cerebral white matter. Multiple calvarial lesions measuring up to 15 mm, compatible with sequela of multiple myeloma given the provided history. Electronically Signed   By: Kellie Simmering D.O.   On: 03/31/2021 16:47     ASSESSMENT:  1.  IgG lambda plasma cell myeloma, stage II,  standard risk: -4 cycles of KPD from 04/17/2018 through 08/21/2018, stem cell transplant on 10/23/2018. -PET scan on 01/21/2019 showed multiple bone lesions but without any hypermetabolic activity. -BMBX on 01/21/2019 with normocellular marrow with no increase in plasma cells.  Normal FISH.  MRD results negative. -Maintenance pomalidomide 2 mg 3 weeks on/1 week off started on 03/10/2019. -Bone marrow biopsy on 10/29/2019 shows trilineage hematopoiesis with no evidence of plasma cells.  Chromosome analysis and FISH are normal. -BM BX on 10/27/2020 at [facility] with 30 to 40%, TLH.  2% of the total cells are CD 138+ plasma cells.   2.  Pulmonary embolism: -CT angiogram on 04/21/2020 showed filling defect at [facility]. -VQ scan confirmed pulmonary embolism. -She is on Eliquis.   3.  Right shoulder and upper arm pain: - She received XRT to the chest from 12/26/2020 through 01/06/2021.   PLAN:  1.  IgG lambda plasma cell myeloma: -Pomalyst was held on 03/29/2021 because of dizziness and fall. - MRI of the brain on 04/01/2019 did not show any evidence of intracranial lesions.  Multiple bone lesions in the skull compatible with sequela of multiple myeloma. - Norvasc was discontinued and dizziness resolved after that. - She has some right lateral rib pain which could be referred pain from her back. - Reviewed labs from 04/20/2021.  M spike is negative.  Free light chain ratio is normal at 1.07.  Kappa light chains improved to 29 and lambda light chains 27.  Immunofixation was unremarkable. - Creatinine was 1.5 with normal electrolytes.  Calcium was 8.3.  Rest of LFTs were normal.  White count is 2.7 with ANC of 1.5. - We will start her back on Pomalyst 2 mg 3 weeks on 1 week off. - RTC 4 weeks for follow-up.   2.  Back pain: -She has chronic low back pain which is stable. - Continue hydrocodone 7.5/325 as needed.  Refill was given.  3.  Myeloma bone disease: -Denosumab was  held due to exposure of bone in the right lower jaw. - Continue calcium and vitamin D supplements.   4.  Anxiety: -Continue Xanax twice daily as needed.   5.  Sleeping difficulty: -Continue Ambien 10 mg at bedtime which is helping.   6.  Normocytic anemia: -Combination anemia from CKD and myelosuppression. - Hemoglobin has improved slightly at 9.2.  MCV is 107.  Last ferritin was 569 and percent saturation was 17.  Folic acid and R42 were within normal limits.  We will closely monitor.   7.  Pulmonary embolism: - Continue Eliquis.  No bleeding issues.   Orders placed this encounter:  No orders of the defined types were placed in this encounter.    [name], MD [place] [phone]   I, Thana Ates, am acting as a scribe for Dr. [name].  I, [name] MD, have reviewed the above documentation for accuracy and completeness, and I agree with the above.

## 2021-04-26 ENCOUNTER — Other Ambulatory Visit: Payer: Self-pay

## 2021-04-26 ENCOUNTER — Inpatient Hospital Stay (HOSPITAL_BASED_OUTPATIENT_CLINIC_OR_DEPARTMENT_OTHER): Payer: Medicare Other | Admitting: Hematology

## 2021-04-26 ENCOUNTER — Other Ambulatory Visit (HOSPITAL_COMMUNITY): Payer: Self-pay

## 2021-04-26 VITALS — BP 156/72 | HR 62 | Temp 99.2°F | Resp 18 | Wt 169.2 lb

## 2021-04-26 DIAGNOSIS — C9001 Multiple myeloma in remission: Secondary | ICD-10-CM

## 2021-04-26 DIAGNOSIS — E538 Deficiency of other specified B group vitamins: Secondary | ICD-10-CM

## 2021-04-26 DIAGNOSIS — C9000 Multiple myeloma not having achieved remission: Secondary | ICD-10-CM | POA: Diagnosis not present

## 2021-04-26 LAB — PROTEIN ELECTROPHORESIS, SERUM
A/G Ratio: 1.4 (ref 0.7–1.7)
Albumin ELP: 3.6 g/dL (ref 2.9–4.4)
Alpha-1-Globulin: 0.2 g/dL (ref 0.0–0.4)
Alpha-2-Globulin: 0.8 g/dL (ref 0.4–1.0)
Beta Globulin: 0.7 g/dL (ref 0.7–1.3)
Gamma Globulin: 0.8 g/dL (ref 0.4–1.8)
Globulin, Total: 2.5 g/dL (ref 2.2–3.9)
Total Protein ELP: 6.1 g/dL (ref 6.0–8.5)

## 2021-04-26 MED ORDER — HYDROCODONE-ACETAMINOPHEN 7.5-325 MG PO TABS: 1.0000 | ORAL_TABLET | Freq: Four times a day (QID) | ORAL | 0 refills | Status: DC | PRN

## 2021-04-26 NOTE — Patient Instructions (Addendum)
[facility] at [facility] Discharge Instructions  You were seen today by Dr. Delton Coombes. He went over your recent results. Resume taking Pomalyst. Dr. Delton Coombes will see you back in 1 month for labs and follow up.   Thank you for choosing [place] at [facility] to provide your oncology and hematology care.  To afford each patient quality time with our provider, please arrive at least 15 minutes before your scheduled appointment time.   If you have a lab appointment with the [facility] please come in thru the Main Entrance and check in at the main information desk  You need to re-schedule your appointment should you arrive 10 or more minutes late.  We strive to give you quality time with our providers, and arriving late affects you and other patients whose appointments are after yours.  Also, if you no show three or more times for appointments you may be dismissed from the clinic at the providers discretion.     Again, thank you for choosing [facility].  Our hope is that these requests will decrease the amount of time that you wait before being seen by our physicians.       _____________________________________________________________  Should you have questions after your visit to [facility], please contact our office at [phone] between the hours of 8:00 a.m. and 4:30 p.m.  Voicemails left after 4:00 p.m. will not be returned until the following business day.  For prescription refill requests, have your pharmacy contact our office and allow 72 hours.    Cancer Center Support Programs:   > Cancer Support Group  2nd Tuesday of the month 1pm-2pm, Journey Room

## 2021-04-27 ENCOUNTER — Encounter (HOSPITAL_COMMUNITY): Payer: Self-pay | Admitting: Hematology

## 2021-05-18 ENCOUNTER — Other Ambulatory Visit (HOSPITAL_COMMUNITY): Payer: Self-pay | Admitting: Hematology

## 2021-05-18 DIAGNOSIS — C9000 Multiple myeloma not having achieved remission: Secondary | ICD-10-CM

## 2021-05-18 NOTE — Telephone Encounter (Signed)
Chart reviewed. Pomalyst refilled per last office note with Dr. Katragadda.  

## 2021-05-22 ENCOUNTER — Other Ambulatory Visit (HOSPITAL_COMMUNITY): Payer: Self-pay | Admitting: *Deleted

## 2021-05-22 DIAGNOSIS — G479 Sleep disorder, unspecified: Secondary | ICD-10-CM

## 2021-05-22 MED ORDER — ZOLPIDEM TARTRATE 10 MG PO TABS: 10.0000 mg | ORAL_TABLET | Freq: Every evening | ORAL | 2 refills | Status: DC | PRN

## 2021-05-24 ENCOUNTER — Other Ambulatory Visit (HOSPITAL_COMMUNITY): Payer: Self-pay

## 2021-05-24 ENCOUNTER — Encounter (HOSPITAL_COMMUNITY): Payer: Self-pay

## 2021-05-24 ENCOUNTER — Inpatient Hospital Stay (HOSPITAL_COMMUNITY): Payer: Medicare Other | Attending: Hematology

## 2021-05-24 ENCOUNTER — Other Ambulatory Visit: Payer: Self-pay

## 2021-05-24 DIAGNOSIS — C9000 Multiple myeloma not having achieved remission: Secondary | ICD-10-CM | POA: Insufficient documentation

## 2021-05-24 DIAGNOSIS — N189 Chronic kidney disease, unspecified: Secondary | ICD-10-CM | POA: Diagnosis not present

## 2021-05-24 DIAGNOSIS — I2699 Other pulmonary embolism without acute cor pulmonale: Secondary | ICD-10-CM | POA: Insufficient documentation

## 2021-05-24 DIAGNOSIS — Z7901 Long term (current) use of anticoagulants: Secondary | ICD-10-CM | POA: Insufficient documentation

## 2021-05-24 DIAGNOSIS — D631 Anemia in chronic kidney disease: Secondary | ICD-10-CM | POA: Diagnosis not present

## 2021-05-24 DIAGNOSIS — Z452 Encounter for adjustment and management of vascular access device: Secondary | ICD-10-CM | POA: Diagnosis not present

## 2021-05-24 DIAGNOSIS — Z23 Encounter for immunization: Secondary | ICD-10-CM | POA: Insufficient documentation

## 2021-05-24 DIAGNOSIS — E538 Deficiency of other specified B group vitamins: Secondary | ICD-10-CM

## 2021-05-24 DIAGNOSIS — C9001 Multiple myeloma in remission: Secondary | ICD-10-CM

## 2021-05-24 LAB — COMPREHENSIVE METABOLIC PANEL
ALT: 17 U/L (ref 0–44)
AST: 18 U/L (ref 15–41)
Albumin: 4 g/dL (ref 3.5–5.0)
Alkaline Phosphatase: 62 U/L (ref 38–126)
Anion gap: 6 (ref 5–15)
BUN: 26 mg/dL — ABNORMAL HIGH (ref 8–23)
CO2: 28 mmol/L (ref 22–32)
Calcium: 8.5 mg/dL — ABNORMAL LOW (ref 8.9–10.3)
Chloride: 105 mmol/L (ref 98–111)
Creatinine, Ser: 1.56 mg/dL — ABNORMAL HIGH (ref 0.44–1.00)
GFR, Estimated: 35 mL/min — ABNORMAL LOW (ref >60)
Glucose, Bld: 128 mg/dL — ABNORMAL HIGH (ref 70–99)
Potassium: 3.9 mmol/L (ref 3.5–5.1)
Sodium: 139 mmol/L (ref 135–145)
Total Bilirubin: 0.7 mg/dL (ref 0.3–1.2)
Total Protein: 6.7 g/dL (ref 6.5–8.1)

## 2021-05-24 LAB — CBC WITH DIFFERENTIAL/PLATELET
Abs Immature Granulocytes: 0 10*3/uL (ref 0.00–0.07)
Basophils Absolute: 0.1 10*3/uL (ref 0.0–0.1)
Basophils Relative: 3 %
Eosinophils Absolute: 0.1 10*3/uL (ref 0.0–0.5)
Eosinophils Relative: 7 %
HCT: 29.2 % — ABNORMAL LOW (ref 36.0–46.0)
Hemoglobin: 9.5 g/dL — ABNORMAL LOW (ref 12.0–15.0)
Immature Granulocytes: 0 %
Lymphocytes Relative: 45 %
Lymphs Abs: 0.9 10*3/uL (ref 0.7–4.0)
MCH: 34.4 pg — ABNORMAL HIGH (ref 26.0–34.0)
MCHC: 32.5 g/dL (ref 30.0–36.0)
MCV: 105.8 fL — ABNORMAL HIGH (ref 80.0–100.0)
Monocytes Absolute: 0.4 10*3/uL (ref 0.1–1.0)
Monocytes Relative: 19 %
Neutro Abs: 0.5 10*3/uL — ABNORMAL LOW (ref 1.7–7.7)
Neutrophils Relative %: 26 %
Platelets: 106 10*3/uL — ABNORMAL LOW (ref 150–400)
RBC: 2.76 MIL/uL — ABNORMAL LOW (ref 3.87–5.11)
RDW: 13.3 % (ref 11.5–15.5)
WBC: 1.9 10*3/uL — ABNORMAL LOW (ref 4.0–10.5)
nRBC: 0 % (ref 0.0–0.2)

## 2021-05-24 LAB — MAGNESIUM: Magnesium: 1.7 mg/dL (ref 1.7–2.4)

## 2021-05-24 MED ORDER — HEPARIN SOD (PORK) LOCK FLUSH 100 UNIT/ML IV SOLN
500.0000 [IU] | Freq: Once | INTRAVENOUS | Status: AC
  Administered 2021-05-24: 500 [IU] via INTRAVENOUS

## 2021-05-24 NOTE — Patient Instructions (Signed)
[facility]  Discharge Instructions: Thank you for choosing [facility] to provide your oncology and hematology care.  If you have a lab appointment with the [facility], please come in thru the Main Entrance and check in at the main information desk.  Wear comfortable clothing and clothing appropriate for easy access to any Portacath or PICC line.   We strive to give you quality time with your provider. You may need to reschedule your appointment if you arrive late (15 or more minutes).  Arriving late affects you and other patients whose appointments are after yours.  Also, if you miss three or more appointments without notifying the office, you may be dismissed from the clinic at the provider's discretion.      For prescription refill requests, have your pharmacy contact our office and allow 72 hours for refills to be completed.    Today you received the following: Port Flush and lab draw.       To help prevent nausea and vomiting after your treatment, we encourage you to take your nausea medication as directed.  BELOW ARE SYMPTOMS THAT SHOULD BE REPORTED IMMEDIATELY: *FEVER GREATER THAN 100.4 F (38 C) OR HIGHER *CHILLS OR SWEATING *NAUSEA AND VOMITING THAT IS NOT CONTROLLED WITH YOUR NAUSEA MEDICATION *UNUSUAL SHORTNESS OF BREATH *UNUSUAL BRUISING OR BLEEDING *URINARY PROBLEMS (pain or burning when urinating, or frequent urination) *BOWEL PROBLEMS (unusual diarrhea, constipation, pain near the anus) TENDERNESS IN MOUTH AND THROAT WITH OR WITHOUT PRESENCE OF ULCERS (sore throat, sores in mouth, or a toothache) UNUSUAL RASH, SWELLING OR PAIN  UNUSUAL VAGINAL DISCHARGE OR ITCHING   Items with * indicate a potential emergency and should be followed up as soon as possible or go to the Emergency Department if any problems should occur.  Please show the CHEMOTHERAPY ALERT CARD or IMMUNOTHERAPY ALERT CARD at check-in to the Emergency Department and triage  nurse.  Should you have questions after your visit or need to cancel or reschedule your appointment, please contact [facility] [phone]  and follow the prompts.  Office hours are 8:00 a.m. to 4:30 p.m. Monday - Friday. Please note that voicemails left after 4:00 p.m. may not be returned until the following business day.  We are closed weekends and major holidays. You have access to a nurse at all times for urgent questions. Please call the main number to the clinic [phone] and follow the prompts.  For any non-urgent questions, you may also contact your provider using MyChart. We now offer e-Visits for anyone 66 and older to request care online for non-urgent symptoms. For details visit [email or web address].   Also download the MyChart app! Go to the app store, search "MyChart", open the app, select [facility], and log in with your MyChart username and password.  Due to Covid, a mask is required upon entering the hospital/clinic. If you do not have a mask, one will be given to you upon arrival. For doctor visits, patients may have 1 support person aged 40 or older with them. For treatment visits, patients cannot have anyone with them due to current Covid guidelines and our immunocompromised population.

## 2021-05-24 NOTE — Progress Notes (Signed)
Alexandra Price presented for Portacath access and flush.  Good blood return present. Portacath flushed with 10 mls of NS and 500U/53ml Heparin and needle removed intact.  Procedure tolerated well and without incident.   Labs drawn and sent. Discharged from clinic ambulatory in stable condition. Alert and oriented x 3. F/U with [facility] as scheduled.

## 2021-05-25 ENCOUNTER — Other Ambulatory Visit (HOSPITAL_COMMUNITY): Payer: Self-pay

## 2021-05-25 MED ORDER — HYDROCODONE-ACETAMINOPHEN 7.5-325 MG PO TABS: 1.0000 | ORAL_TABLET | Freq: Four times a day (QID) | ORAL | 0 refills | Status: DC | PRN

## 2021-05-30 NOTE — Progress Notes (Signed)
[place], [place] [address]   CLINIC:  Medical Oncology/Hematology  PCP:  [facility] / [place] [address] [phone]   REASON FOR VISIT:  Follow-up for multiple myeloma  PRIOR THERAPY:  1. KPD x 4 cycles from 04/17/2018 to 08/21/2018. 2. Stem cell transplant on 10/23/2018. 3. Radiation to left and right humerus 25 Gy in 10 fractions from 08/23/2020 to 08/25/2020.  NGS Results: not done  CURRENT THERAPY: Maintenance Pomalyst 3/4 weeks  BRIEF ONCOLOGIC HISTORY:  Oncology History  Multiple myeloma without remission ([place])  04/15/2018 Initial Diagnosis   Multiple myeloma without remission ([place])   04/17/2018 - 09/05/2018 Chemotherapy   The patient had dexamethasone (DECADRON) 4 MG tablet, 1 of 1 cycle, Start date: 04/15/2018, End date: 05/26/2018 palonosetron (ALOXI) injection 0.25 mg, 0.25 mg, Intravenous,  Once, 1 of 1 cycle Administration: 0.25 mg (04/17/2018), 0.25 mg (04/24/2018), 0.25 mg (05/01/2018) cyclophosphamide (CYTOXAN) 540 mg in sodium chloride 0.9 % 250 mL chemo infusion, 300 mg/m2 = 540 mg, Intravenous,  Once, 1 of 1 cycle Administration: 540 mg (04/17/2018), 540 mg (04/24/2018), 540 mg (05/01/2018) carfilzomib (KYPROLIS) 36 mg in dextrose 5 % 50 mL chemo infusion, 20 mg/m2 = 36 mg, Intravenous, Once, 5 of 5 cycles Administration: 36 mg (04/17/2018), 36 mg (04/18/2018), 60 mg (04/24/2018), 60 mg (04/25/2018), 60 mg (05/01/2018), 60 mg (05/02/2018), 60 mg (05/15/2018), 60 mg (05/16/2018), 60 mg (06/12/2018), 60 mg (06/13/2018), 60 mg (06/26/2018), 60 mg (06/27/2018), 60 mg (07/03/2018), 60 mg (07/04/2018), 60 mg (07/24/2018), 60 mg (07/25/2018), 60 mg (07/31/2018), 60 mg (08/01/2018), 60 mg (08/08/2018), 60 mg (08/07/2018), 60 mg (08/21/2018), 60 mg (08/22/2018), 60 mg (08/28/2018), 60 mg (08/29/2018), 60 mg (09/04/2018), 60 mg (09/05/2018)   for chemotherapy treatment.       CANCER STAGING: Cancer Staging No matching  staging information was found for the patient.  INTERVAL HISTORY:  Ms. MIKIYA NEBERGALL, a 75 y.o. female, returns for routine follow-up of her multiple myeloma. Karle was last seen on 04/26/2021.   Today she reports feeling good. She has been taking Pomalyst 3 weeks on and 1 week off. She started this bottle 10/01 after stopping the pill for a month, and will she be due for her off week 10/22. She reports mild pain in her right arm when lying down. She denies tingling/numbness and swelling in her hands and feet. She reports mild dizziness upon standing and denies fever.   REVIEW OF SYSTEMS:  Review of Systems  Constitutional:  Negative for appetite change, fatigue (70%) and fever.  Cardiovascular:  Negative for leg swelling.  Musculoskeletal:  Positive for back pain (6/10).  Neurological:  Positive for dizziness (upon standing) and headaches. Negative for numbness.  Psychiatric/Behavioral:  Positive for depression. The patient is nervous/anxious.   All other systems reviewed and are negative.  PAST MEDICAL/SURGICAL HISTORY:  Past Medical History:  Diagnosis Date   Anxiety    Depression    Hypertension    Multiple myeloma ([place])    multiple myeloma   Past Surgical History:  Procedure Laterality Date   ABDOMINAL HYSTERECTOMY     total   APPENDECTOMY     LAPAROSCOPIC APPENDECTOMY N/A 05/20/2018   Procedure: APPENDECTOMY LAPAROSCOPIC;  Surgeon: [name], MD;  Location: [facility];  Service: General;  Laterality: N/A;   PORTACATH PLACEMENT Right 04/14/2018   Procedure: INSERTION PORT-A-CATH;  Surgeon: [name], MD;  Location: [facility];  Service: General;  Laterality: Right;  SOCIAL HISTORY:  Social History   Socioeconomic History   Marital status: Legally Separated    Spouse name: Not on file   Number of children: 6   Years of education: Not on file   Highest education level: Not on file  Occupational History    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  Tobacco Use   Smoking  status: Never   Smokeless tobacco: Never  Vaping Use   Vaping Use: Never used  Substance and Sexual Activity   Alcohol use: Never   Drug use: Not Currently   Sexual activity: Not Currently  Other Topics Concern   Not on file  Social History Narrative   Not on file   Social Determinants of Health   Financial Resource Strain: Low Risk    Difficulty of Paying Living Expenses: Not hard at all  Food Insecurity: No Food Insecurity   Worried About Charity fundraiser in the Last Year: Never true   [place] in the Last Year: Never true  Transportation Needs: No Transportation Needs   Lack of Transportation (Medical): No   Lack of Transportation (Non-Medical): No  Physical Activity: Inactive   Days of Exercise per Week: 0 days   Minutes of Exercise per Session: 0 min  Stress: No Stress Concern Present   Feeling of Stress : Not at all  Social Connections: Moderately Isolated   Frequency of Communication with Friends and Family: More than three times a week   Frequency of Social Gatherings with Friends and Family: Twice a week   Attends Religious Services: 1 to 4 times per year   Active Member of [employer] or Organizations: No   Attends Archivist Meetings: Never   Marital Status: Separated  Human resources officer Violence: Not At Risk   Fear of Current or Ex-Partner: No   Emotionally Abused: No   Physically Abused: No   Sexually Abused: No    FAMILY HISTORY:  Family History  Problem Relation Age of Onset   Heart disease Mother    Emphysema Father    Diabetes Sister    Depression Sister    Cancer Brother        liver, lung, and colon   Diabetes Brother     CURRENT MEDICATIONS:  Current Outpatient Medications  Medication Sig Dispense Refill   acyclovir (ZOVIRAX) 400 MG tablet Take 1 tablet (400 mg total) by mouth 2 (two) times daily. 60 tablet 4   acyclovir (ZOVIRAX) 400 MG tablet 1 tablet     albuterol (VENTOLIN HFA) 108 (90 Base) MCG/ACT inhaler Inhale 2 puffs  into the lungs in the morning, at noon, in the evening, and at bedtime. 8 g 0   allopurinol (ZYLOPRIM) 300 MG tablet 1 tablet     ALPRAZolam (XANAX) 0.5 MG tablet Take one tablet during the day as needed for anxiety and two tablets at bedtime as needed for anxiety/insomnia 90 tablet 3   ALPRAZolam (XANAX) 0.5 MG tablet 1 tablet     apixaban (ELIQUIS) 2.5 MG TABS tablet See admin instructions.     apixaban (ELIQUIS) 2.5 MG TABS tablet Take by mouth.     aspirin 81 MG chewable tablet 1 tablet     benzonatate (TESSALON) 100 MG capsule Take 1 capsule (100 mg total) by mouth 3 (three) times daily. 20 capsule 0   BINAXNOW COVID-19 AG HOME TEST KIT Use as Directed on the Package     Calcium Carb-Cholecalciferol (CALCIUM 1000 + D PO) Take 1,000 mg by mouth  daily. Take one tablet daily until next visit with oncologist.     Calcium Carb-Cholecalciferol (CALCIUM 1000 + D) 1000-800 MG-UNIT TABS 1 tablet with a meal     citalopram (CELEXA) 20 MG tablet 1 tablet     citalopram (CELEXA) 20 MG tablet Take 20 mg by mouth daily.     citalopram (CELEXA) 40 MG tablet Take 1 tablet (40 mg total) by mouth daily. 90 tablet 3   ELIQUIS 2.5 MG TABS tablet Take 2.5 mg by mouth 2 (two) times daily.     ELIQUIS 5 MG TABS tablet Take 5 mg by mouth 2 (two) times daily.     famotidine (PEPCID) 20 MG tablet 1 tablet     folic acid (FOLVITE) 1 MG tablet 1 tablet     HYDROcodone-acetaminophen (NORCO) 7.5-325 MG tablet Take 1 tablet by mouth every 6 (six) hours as needed for moderate pain. 120 tablet 0   hydrOXYzine (ATARAX/VISTARIL) 25 MG tablet 1 tablet as needed     Lidocaine-Prilocaine (EMLA EX) apply to affected area     loperamide (IMODIUM) 2 MG capsule Take 1 capsule (2 mg total) by mouth as needed for diarrhea or loose stools. 30 capsule 0   mirtazapine (REMERON) 15 MG tablet 1/2 tablet at bedtime for 1 week, then increase to 1 tablet nightly     mirtazapine (REMERON) 15 MG tablet Take by mouth.     Multiple Vitamin  (THERA) TABS Take 1 tablet by mouth daily. 30 tablet 5   Multiple Vitamin (THERA) TABS 1 tablet     ondansetron (ZOFRAN) 4 MG tablet Take 1 tablet (4 mg total) by mouth every 8 (eight) hours as needed for nausea or vomiting. 20 tablet 1   ondansetron (ZOFRAN) 4 MG/5ML solution 1 tablet     pantoprazole (PROTONIX) 40 MG tablet Take 1 tablet (40 mg total) by mouth daily. 90 tablet 3   pantoprazole (PROTONIX) 40 MG tablet 1 tablet     polyethylene glycol (MIRALAX / GLYCOLAX) 17 g packet 1 packet mixed with 8 ounces of fluid     pomalidomide (POMALYST) 2 MG capsule 1 capsule     POMALYST 2 MG capsule TAKE 1 CAPSULE BY MOUTH ONCE DAILY 21 capsule 1   Prochlorperazine Edisylate (COMPAZINE IJ) 10 mg- 1 tablet q6hr prn     promethazine (PHENERGAN) 25 MG tablet 1 tablet as needed     rosuvastatin (CRESTOR) 10 MG tablet Take 10 mg by mouth daily.     rosuvastatin (CRESTOR) 10 MG tablet 1 tablet     saccharomyces boulardii (FLORASTOR) 250 MG capsule Take 1 capsule (250 mg total) by mouth 2 (two) times daily. 20 capsule 0   terbinafine (LAMISIL) 1 % cream Apply 1 application topically 2 (two) times daily. 30 g 0   terbinafine (LAMISIL) 1 % cream 1 application     TERBINAFINE EX 1 application     zolpidem (AMBIEN) 10 MG tablet Take 1 tablet (10 mg total) by mouth at bedtime as needed for sleep. 30 tablet 2   zolpidem (AMBIEN) 10 MG tablet 1 tablet at bedtime as needed     No current facility-administered medications for this visit.    ALLERGIES:  Allergies  Allergen Reactions   Ciprofloxacin Anaphylaxis   Amoxicillin    Morphine    Morphine And Related Nausea And Vomiting   Augmentin [Amoxicillin-Pot Clavulanate] Other (See Comments)    Headache, insomnia    PHYSICAL EXAM:  Performance status (ECOG): 1 - Symptomatic but completely  ambulatory  There were no vitals filed for this visit. Wt Readings from Last 3 Encounters:  04/26/21 169 lb 3.2 oz (76.7 kg)  04/05/21 166 lb 3.2 oz (75.4 kg)   03/29/21 166 lb 12.8 oz (75.7 kg)   Physical Exam Vitals reviewed.  Constitutional:      Appearance: Normal appearance.  Cardiovascular:     Rate and Rhythm: Normal rate and regular rhythm.     Pulses: Normal pulses.     Heart sounds: Normal heart sounds.  Pulmonary:     Effort: Pulmonary effort is normal.     Breath sounds: Normal breath sounds.  Musculoskeletal:     Right forearm: Normal. No swelling.     Left forearm: Normal. No swelling.  Neurological:     General: No focal deficit present.     Mental Status: She is alert and oriented to person, place, and time.  Psychiatric:        Mood and Affect: Mood normal.        Behavior: Behavior normal.     LABORATORY DATA:  I have reviewed the labs as listed.  CBC Latest Ref Rng & Units 05/24/2021 04/20/2021 03/29/2021  WBC 4.0 - 10.5 K/uL 1.9(L) 2.7(L) 2.9(L)  Hemoglobin 12.0 - 15.0 g/dL 9.5(L) 9.2(L) 8.7(L)  Hematocrit 36.0 - 46.0 % 29.2(L) 28.9(L) 26.7(L)  Platelets 150 - 400 K/uL 106(L) 132(L) 122(L)   CMP Latest Ref Rng & Units 05/24/2021 04/20/2021 03/29/2021  Glucose 70 - 99 mg/dL 128(H) 152(H) 133(H)  BUN 8 - 23 mg/dL 26(H) 27(H) 28(H)  Creatinine 0.44 - 1.00 mg/dL 1.56(H) 1.55(H) 1.74(H)  Sodium 135 - 145 mmol/L 139 136 136  Potassium 3.5 - 5.1 mmol/L 3.9 3.9 4.0  Chloride 98 - 111 mmol/L 105 106 103  CO2 22 - 32 mmol/L _0 Calcium 8.9 - 10.3 mg/dL 8.5(L) 8.3(L) 8.4(L)  Total Protein 6.5 - 8.1 g/dL 6.7 6.6 7.2  Total Bilirubin 0.3 - 1.2 mg/dL 0.7 0.5 0.4  Alkaline Phos 38 - 126 U/L 62 70 66  AST 15 - 41 U/L 18 16 14(L)  ALT 0 - 44 U/L _1 DIAGNOSTIC IMAGING:  I have independently reviewed the scans and discussed with the patient. No results found.   ASSESSMENT:  1.  IgG lambda plasma cell myeloma, stage II, standard risk: -4 cycles of KPD from 04/17/2018 through 08/21/2018, stem cell transplant on 10/23/2018. -PET scan on 01/21/2019 showed multiple bone lesions but without any hypermetabolic  activity. -BMBX on 01/21/2019 with normocellular marrow with no increase in plasma cells.  Normal FISH.  MRD results negative. -Maintenance pomalidomide 2 mg 3 weeks on/1 week off started on 03/10/2019. -Bone marrow biopsy on 10/29/2019 shows trilineage hematopoiesis with no evidence of plasma cells.  Chromosome analysis and FISH are normal. -BM BX on 10/27/2020 at [facility] with 30 to 40%, TLH.  2% of the total cells are CD 138+ plasma cells. - Pomalyst was held due to dizziness from 03/29/2021 through 04/26/2021. - MRI of the brain on 03/31/2021 did not show any evidence of intracranial lesions.  Multiple bone lesions in the skull compatible with sequela of myeloma.   2.  Pulmonary embolism: -CT angiogram on 04/21/2020 showed filling defect at [facility]. -VQ scan confirmed pulmonary embolism. -She is on Eliquis.   3.  Right shoulder and upper arm pain: - She received XRT to the chest from 12/26/2020 through 01/06/2021.   PLAN:  1.  IgG lambda  plasma cell myeloma: - Dizziness has resolved after Norvasc was discontinued. - She started taking Pomalyst on 05/20/2021.  Denies any infections or GI symptoms. - She has only mild dizziness upon standing suddenly.  No fevers reported. - Myeloma panel on 04/20/2021 shows M spike was not observed.  Free light chain ratio was normal at 1.07 and kappa light chains 29 and lambda light chains 27. - Reviewed labs from today which showed white count is 1.9 with ANC around 500.  Platelet count is 106.  Creatinine is 1.56 and stable.  Rest of the LFTs are normal. - Recommend holding Pomalyst.  Will recheck CBC next week.  If [place] is close to 1000, will start her back on the same dose.  She was told to go to the ER if she has a fever more than 100.5 degrees. - RTC 8 weeks with repeat myeloma labs 1 week prior.   2.  Back pain: - Continue hydrocodone 7.5/325 as needed for chronic low back pain which is stable.   3.  Myeloma bone  disease: - Denosumab was held due to exposure of the bone in the right lower jaw. - Continue calcium and vitamin D supplements.   4.  Anxiety: - Continue Xanax twice daily as needed.   5.  Sleeping difficulty: -Continue Ambien 10 mg at bedtime which is helping.   6.  Normocytic anemia: - Combination anemia from CKD and myelosuppression.  Hemoglobin is 9.5 with MCV of 105. - Last ferritin was 569 with percent saturation 17.  X52 and folic acid were normal.   7.  Pulmonary embolism: - Continue Eliquis.  No bleeding issues.   Orders placed this encounter:  No orders of the defined types were placed in this encounter.    [name], MD [name] [phone]   I, Thana Ates, am acting as a scribe for Dr. [name].  I, [name] MD, have reviewed the above documentation for accuracy and completeness, and I agree with the above.

## 2021-05-31 ENCOUNTER — Inpatient Hospital Stay (HOSPITAL_BASED_OUTPATIENT_CLINIC_OR_DEPARTMENT_OTHER): Payer: Medicare Other | Admitting: Hematology

## 2021-05-31 ENCOUNTER — Other Ambulatory Visit: Payer: Self-pay

## 2021-05-31 VITALS — BP 142/76 | HR 64 | Temp 96.8°F | Resp 18 | Wt 172.6 lb

## 2021-05-31 DIAGNOSIS — C9001 Multiple myeloma in remission: Secondary | ICD-10-CM | POA: Diagnosis not present

## 2021-05-31 DIAGNOSIS — Z23 Encounter for immunization: Secondary | ICD-10-CM

## 2021-05-31 DIAGNOSIS — E538 Deficiency of other specified B group vitamins: Secondary | ICD-10-CM

## 2021-05-31 DIAGNOSIS — C9000 Multiple myeloma not having achieved remission: Secondary | ICD-10-CM | POA: Diagnosis not present

## 2021-05-31 MED ORDER — INFLUENZA VAC A&B SA ADJ QUAD 0.5 ML IM PRSY
0.5000 mL | PREFILLED_SYRINGE | Freq: Once | INTRAMUSCULAR | Status: AC
  Administered 2021-05-31: 0.5 mL via INTRAMUSCULAR
  Filled 2021-05-31: qty 0.5

## 2021-05-31 NOTE — Patient Instructions (Signed)
[place] at [facility] Discharge Instructions  You were seen and examined today by Dr. Delton Coombes. Hold Pomalyst until repeat lab next week. Follow up as scheduled.   Thank you for choosing [facility] at [facility] to provide your oncology and hematology care.  To afford each patient quality time with our provider, please arrive at least 15 minutes before your scheduled appointment time.   If you have a lab appointment with the [facility] please come in thru the Main Entrance and check in at the main information desk.  You need to re-schedule your appointment should you arrive 10 or more minutes late.  We strive to give you quality time with our providers, and arriving late affects you and other patients whose appointments are after yours.  Also, if you no show three or more times for appointments you may be dismissed from the clinic at the providers discretion.     Again, thank you for choosing [facility].  Our hope is that these requests will decrease the amount of time that you wait before being seen by our physicians.       _____________________________________________________________  Should you have questions after your visit to [facility], please contact our office at [phone] and follow the prompts.  Our office hours are 8:00 a.m. and 4:30 p.m. Monday - Friday.  Please note that voicemails left after 4:00 p.m. may not be returned until the following business day.  We are closed weekends and major holidays.  You do have access to a nurse 24-7, just call the main number to the clinic [phone] and do not press any options, hold on the line and a nurse will answer the phone.    For prescription refill requests, have your pharmacy contact our office and allow 72 hours.    Due to Covid, you will need to wear a mask upon entering the hospital. If you do not have a mask, a mask will be given to you at the  Main Entrance upon arrival. For doctor visits, patients may have 1 support person age 21 or older with them. For treatment visits, patients can not have anyone with them due to social distancing guidelines and our immunocompromised population.

## 2021-05-31 NOTE — Progress Notes (Signed)
Patient come in today for office visit. Flu vaccine ordered. See MAR for administration information. Patient stable throughout injection. Patient discharged ambulatory and in stable condition.  

## 2021-06-07 ENCOUNTER — Other Ambulatory Visit (HOSPITAL_COMMUNITY): Payer: Self-pay

## 2021-06-07 ENCOUNTER — Inpatient Hospital Stay (HOSPITAL_COMMUNITY): Payer: Medicare Other

## 2021-06-07 ENCOUNTER — Encounter (HOSPITAL_COMMUNITY): Payer: Self-pay

## 2021-06-07 ENCOUNTER — Other Ambulatory Visit: Payer: Self-pay

## 2021-06-07 DIAGNOSIS — C9000 Multiple myeloma not having achieved remission: Secondary | ICD-10-CM | POA: Diagnosis not present

## 2021-06-07 DIAGNOSIS — C9001 Multiple myeloma in remission: Secondary | ICD-10-CM

## 2021-06-07 DIAGNOSIS — D528 Other folate deficiency anemias: Secondary | ICD-10-CM

## 2021-06-07 LAB — CBC WITH DIFFERENTIAL/PLATELET
Abs Immature Granulocytes: 0.01 10*3/uL (ref 0.00–0.07)
Basophils Absolute: 0.1 10*3/uL (ref 0.0–0.1)
Basophils Relative: 2 %
Eosinophils Absolute: 0.2 10*3/uL (ref 0.0–0.5)
Eosinophils Relative: 6 %
HCT: 31.4 % — ABNORMAL LOW (ref 36.0–46.0)
Hemoglobin: 10.1 g/dL — ABNORMAL LOW (ref 12.0–15.0)
Immature Granulocytes: 0 %
Lymphocytes Relative: 37 %
Lymphs Abs: 1.1 10*3/uL (ref 0.7–4.0)
MCH: 34.1 pg — ABNORMAL HIGH (ref 26.0–34.0)
MCHC: 32.2 g/dL (ref 30.0–36.0)
MCV: 106.1 fL — ABNORMAL HIGH (ref 80.0–100.0)
Monocytes Absolute: 0.3 10*3/uL (ref 0.1–1.0)
Monocytes Relative: 11 %
Neutro Abs: 1.3 10*3/uL — ABNORMAL LOW (ref 1.7–7.7)
Neutrophils Relative %: 44 %
Platelets: 138 10*3/uL — ABNORMAL LOW (ref 150–400)
RBC: 2.96 MIL/uL — ABNORMAL LOW (ref 3.87–5.11)
RDW: 12.7 % (ref 11.5–15.5)
WBC: 3 10*3/uL — ABNORMAL LOW (ref 4.0–10.5)
nRBC: 0 % (ref 0.0–0.2)

## 2021-06-07 NOTE — Progress Notes (Signed)
Message from Dr. Delton Coombes after he reviewed her CBC results. Please call and let her know that she may start back on Pomalyst.  Thanks.  Patient aware.

## 2021-06-09 ENCOUNTER — Other Ambulatory Visit: Payer: Self-pay

## 2021-06-09 ENCOUNTER — Inpatient Hospital Stay (HOSPITAL_COMMUNITY): Payer: Medicare Other

## 2021-06-09 DIAGNOSIS — C9000 Multiple myeloma not having achieved remission: Secondary | ICD-10-CM | POA: Diagnosis not present

## 2021-06-09 DIAGNOSIS — D528 Other folate deficiency anemias: Secondary | ICD-10-CM

## 2021-06-09 DIAGNOSIS — C9001 Multiple myeloma in remission: Secondary | ICD-10-CM

## 2021-06-09 LAB — FERRITIN: Ferritin: 499 ng/mL — ABNORMAL HIGH (ref 11–307)

## 2021-06-09 LAB — IRON AND TIBC
Iron: 68 ug/dL (ref 28–170)
Saturation Ratios: 23 % (ref 10.4–31.8)
TIBC: 296 ug/dL (ref 250–450)
UIBC: 228 ug/dL

## 2021-06-14 ENCOUNTER — Other Ambulatory Visit (HOSPITAL_COMMUNITY): Payer: Self-pay | Admitting: Hematology

## 2021-06-14 DIAGNOSIS — C9000 Multiple myeloma not having achieved remission: Secondary | ICD-10-CM

## 2021-06-14 NOTE — Telephone Encounter (Signed)
Chart reviewed. Pomalyst refilled per verbal order from Dr. Katragadda.  

## 2021-06-26 ENCOUNTER — Other Ambulatory Visit (HOSPITAL_COMMUNITY): Payer: Self-pay

## 2021-06-27 ENCOUNTER — Encounter (HOSPITAL_COMMUNITY): Payer: Self-pay | Admitting: Hematology

## 2021-06-27 MED ORDER — HYDROCODONE-ACETAMINOPHEN 7.5-325 MG PO TABS: 1.0000 | ORAL_TABLET | Freq: Four times a day (QID) | ORAL | 0 refills | Status: DC | PRN

## 2021-07-06 ENCOUNTER — Other Ambulatory Visit (HOSPITAL_COMMUNITY): Payer: Self-pay | Admitting: *Deleted

## 2021-07-06 DIAGNOSIS — C9000 Multiple myeloma not having achieved remission: Secondary | ICD-10-CM

## 2021-07-06 DIAGNOSIS — F419 Anxiety disorder, unspecified: Secondary | ICD-10-CM

## 2021-07-06 MED ORDER — ALPRAZOLAM 0.5 MG PO TABS: ORAL_TABLET | ORAL | 0 refills | Status: DC

## 2021-07-10 ENCOUNTER — Other Ambulatory Visit (HOSPITAL_COMMUNITY): Payer: Self-pay

## 2021-07-10 DIAGNOSIS — C9000 Multiple myeloma not having achieved remission: Secondary | ICD-10-CM

## 2021-07-10 MED ORDER — ACYCLOVIR 400 MG PO TABS
400.0000 mg | ORAL_TABLET | Freq: Two times a day (BID) | ORAL | 4 refills | Status: DC
Start: 2021-07-10 — End: 2021-12-15

## 2021-07-17 ENCOUNTER — Other Ambulatory Visit (HOSPITAL_COMMUNITY): Payer: Self-pay | Admitting: Hematology

## 2021-07-17 DIAGNOSIS — C9000 Multiple myeloma not having achieved remission: Secondary | ICD-10-CM

## 2021-07-17 NOTE — Telephone Encounter (Signed)
Chart reviewed. Pomalyst refilled per verbal order from Dr. Katragadda.  

## 2021-07-25 ENCOUNTER — Inpatient Hospital Stay (HOSPITAL_COMMUNITY): Payer: Medicare Other | Attending: Hematology

## 2021-07-25 ENCOUNTER — Encounter (HOSPITAL_COMMUNITY): Payer: Self-pay

## 2021-07-25 ENCOUNTER — Other Ambulatory Visit: Payer: Self-pay

## 2021-07-25 DIAGNOSIS — Z7901 Long term (current) use of anticoagulants: Secondary | ICD-10-CM | POA: Insufficient documentation

## 2021-07-25 DIAGNOSIS — I2699 Other pulmonary embolism without acute cor pulmonale: Secondary | ICD-10-CM | POA: Insufficient documentation

## 2021-07-25 DIAGNOSIS — Z452 Encounter for adjustment and management of vascular access device: Secondary | ICD-10-CM | POA: Insufficient documentation

## 2021-07-25 DIAGNOSIS — M79629 Pain in unspecified upper arm: Secondary | ICD-10-CM | POA: Diagnosis not present

## 2021-07-25 DIAGNOSIS — M549 Dorsalgia, unspecified: Secondary | ICD-10-CM | POA: Diagnosis not present

## 2021-07-25 DIAGNOSIS — I129 Hypertensive chronic kidney disease with stage 1 through stage 4 chronic kidney disease, or unspecified chronic kidney disease: Secondary | ICD-10-CM | POA: Diagnosis not present

## 2021-07-25 DIAGNOSIS — C9000 Multiple myeloma not having achieved remission: Secondary | ICD-10-CM | POA: Diagnosis present

## 2021-07-25 DIAGNOSIS — D631 Anemia in chronic kidney disease: Secondary | ICD-10-CM | POA: Insufficient documentation

## 2021-07-25 DIAGNOSIS — C9001 Multiple myeloma in remission: Secondary | ICD-10-CM

## 2021-07-25 DIAGNOSIS — N189 Chronic kidney disease, unspecified: Secondary | ICD-10-CM | POA: Insufficient documentation

## 2021-07-25 LAB — COMPREHENSIVE METABOLIC PANEL
ALT: 19 U/L (ref 0–44)
AST: 18 U/L (ref 15–41)
Albumin: 3.7 g/dL (ref 3.5–5.0)
Alkaline Phosphatase: 59 U/L (ref 38–126)
Anion gap: 7 (ref 5–15)
BUN: 25 mg/dL — ABNORMAL HIGH (ref 8–23)
CO2: 26 mmol/L (ref 22–32)
Calcium: 8.4 mg/dL — ABNORMAL LOW (ref 8.9–10.3)
Chloride: 103 mmol/L (ref 98–111)
Creatinine, Ser: 1.46 mg/dL — ABNORMAL HIGH (ref 0.44–1.00)
GFR, Estimated: 38 mL/min — ABNORMAL LOW (ref >60)
Glucose, Bld: 107 mg/dL — ABNORMAL HIGH (ref 70–99)
Potassium: 4 mmol/L (ref 3.5–5.1)
Sodium: 136 mmol/L (ref 135–145)
Total Bilirubin: 0.3 mg/dL (ref 0.3–1.2)
Total Protein: 6.5 g/dL (ref 6.5–8.1)

## 2021-07-25 LAB — CBC WITH DIFFERENTIAL/PLATELET
Abs Immature Granulocytes: 0 10*3/uL (ref 0.00–0.07)
Basophils Absolute: 0 10*3/uL (ref 0.0–0.1)
Basophils Relative: 2 %
Eosinophils Absolute: 0.3 10*3/uL (ref 0.0–0.5)
Eosinophils Relative: 15 %
HCT: 29.2 % — ABNORMAL LOW (ref 36.0–46.0)
Hemoglobin: 9.5 g/dL — ABNORMAL LOW (ref 12.0–15.0)
Immature Granulocytes: 0 %
Lymphocytes Relative: 37 %
Lymphs Abs: 0.8 10*3/uL (ref 0.7–4.0)
MCH: 34.3 pg — ABNORMAL HIGH (ref 26.0–34.0)
MCHC: 32.5 g/dL (ref 30.0–36.0)
MCV: 105.4 fL — ABNORMAL HIGH (ref 80.0–100.0)
Monocytes Absolute: 0.4 10*3/uL (ref 0.1–1.0)
Monocytes Relative: 20 %
Neutro Abs: 0.6 10*3/uL — ABNORMAL LOW (ref 1.7–7.7)
Neutrophils Relative %: 26 %
Platelets: 92 10*3/uL — ABNORMAL LOW (ref 150–400)
RBC: 2.77 MIL/uL — ABNORMAL LOW (ref 3.87–5.11)
RDW: 12.7 % (ref 11.5–15.5)
WBC: 2.2 10*3/uL — ABNORMAL LOW (ref 4.0–10.5)
nRBC: 0 % (ref 0.0–0.2)

## 2021-07-25 MED ORDER — HEPARIN SOD (PORK) LOCK FLUSH 100 UNIT/ML IV SOLN
500.0000 [IU] | Freq: Once | INTRAVENOUS | Status: AC
  Administered 2021-07-25: 500 [IU] via INTRAVENOUS

## 2021-07-25 MED ORDER — SODIUM CHLORIDE 0.9% FLUSH
10.0000 mL | Freq: Once | INTRAVENOUS | Status: AC
  Administered 2021-07-25: 10 mL via INTRAVENOUS

## 2021-07-25 NOTE — Progress Notes (Signed)
Port flushed with good blood return noted. No bruising or swelling at site. Labs drawn. Bandaid applied and patient discharged in satisfactory condition. VVS stable with no signs or symptoms of distressed noted.

## 2021-07-25 NOTE — Patient Instructions (Signed)
[facility]  Discharge Instructions: °Thank you for choosing [facility] to provide your oncology and hematology care.  °If you have a lab appointment with the Cancer Center, please come in thru the Main Entrance and check in at the main information desk. ° °Wear comfortable clothing and clothing appropriate for easy access to any Portacath or PICC line.  ° °We strive to give you quality time with your provider. You may need to reschedule your appointment if you arrive late (15 or more minutes).  Arriving late affects you and other patients whose appointments are after yours.  Also, if you miss three or more appointments without notifying the office, you may be dismissed from the clinic at the provider’s discretion.    °  °For prescription refill requests, have your pharmacy contact our office and allow 72 hours for refills to be completed.   ° °Today your port was flushed and labs were drawn. Return as scheduled. °  °To help prevent nausea and vomiting after your treatment, we encourage you to take your nausea medication as directed. ° °BELOW ARE SYMPTOMS THAT SHOULD BE REPORTED IMMEDIATELY: °*FEVER GREATER THAN 100.4 F (38 °C) OR HIGHER °*CHILLS OR SWEATING °*NAUSEA AND VOMITING THAT IS NOT CONTROLLED WITH YOUR NAUSEA MEDICATION °*UNUSUAL SHORTNESS OF BREATH °*UNUSUAL BRUISING OR BLEEDING °*URINARY PROBLEMS (pain or burning when urinating, or frequent urination) °*BOWEL PROBLEMS (unusual diarrhea, constipation, pain near the anus) °TENDERNESS IN MOUTH AND THROAT WITH OR WITHOUT PRESENCE OF ULCERS (sore throat, sores in mouth, or a toothache) °UNUSUAL RASH, SWELLING OR PAIN  °UNUSUAL VAGINAL DISCHARGE OR ITCHING  ° °Items with * indicate a potential emergency and should be followed up as soon as possible or go to the Emergency Department if any problems should occur. ° °Please show the CHEMOTHERAPY ALERT CARD or IMMUNOTHERAPY ALERT CARD at check-in to the Emergency Department and triage  nurse. ° °Should you have questions after your visit or need to cancel or reschedule your appointment, please contact [facility] [phone]  and follow the prompts.  Office hours are 8:00 a.m. to 4:30 p.m. Monday - Friday. Please note that voicemails left after 4:00 p.m. may not be returned until the following business day.  We are closed weekends and major holidays. You have access to a nurse at all times for urgent questions. Please call the main number to the clinic [phone] and follow the prompts. ° °For any non-urgent questions, you may also contact your provider using MyChart. We now offer e-Visits for anyone 18 and older to request care online for non-urgent symptoms. For details visit [email or web address]. °  °Also download the MyChart app! Go to the app store, search "MyChart", open the app, select [facility], and log in with your MyChart username and password. ° °Due to Covid, a mask is required upon entering the hospital/clinic. If you do not have a mask, one will be given to you upon arrival. For doctor visits, patients may have 1 support person aged 18 or older with them. For treatment visits, patients cannot have anyone with them due to current Covid guidelines and our immunocompromised population.  °

## 2021-07-26 LAB — KAPPA/LAMBDA LIGHT CHAINS
Kappa free light chain: 51.7 mg/L — ABNORMAL HIGH (ref 3.3–19.4)
Kappa, lambda light chain ratio: 1.12 (ref 0.26–1.65)
Lambda free light chains: 46 mg/L — ABNORMAL HIGH (ref 5.7–26.3)

## 2021-07-27 LAB — PROTEIN ELECTROPHORESIS, SERUM
A/G Ratio: 1.1 (ref 0.7–1.7)
Albumin ELP: 3.3 g/dL (ref 2.9–4.4)
Alpha-1-Globulin: 0.3 g/dL (ref 0.0–0.4)
Alpha-2-Globulin: 0.9 g/dL (ref 0.4–1.0)
Beta Globulin: 0.8 g/dL (ref 0.7–1.3)
Gamma Globulin: 0.8 g/dL (ref 0.4–1.8)
Globulin, Total: 2.9 g/dL (ref 2.2–3.9)
Total Protein ELP: 6.2 g/dL (ref 6.0–8.5)

## 2021-07-27 LAB — IMMUNOFIXATION ELECTROPHORESIS
IgA: 88 mg/dL (ref 64–422)
IgG (Immunoglobin G), Serum: 901 mg/dL (ref 586–1602)
IgM (Immunoglobulin M), Srm: 27 mg/dL (ref 26–217)
Total Protein ELP: 6.1 g/dL (ref 6.0–8.5)

## 2021-07-28 ENCOUNTER — Other Ambulatory Visit (HOSPITAL_COMMUNITY): Payer: Self-pay

## 2021-07-28 MED ORDER — HYDROCODONE-ACETAMINOPHEN 7.5-325 MG PO TABS: 1.0000 | ORAL_TABLET | Freq: Four times a day (QID) | ORAL | 0 refills | Status: DC | PRN

## 2021-07-31 NOTE — Progress Notes (Signed)
[place], [place] [address]   CLINIC:  Medical Oncology/Hematology  PCP:  [facility] / [place] [address] [phone]   REASON FOR VISIT:  Follow-up for multiple myeloma  PRIOR THERAPY:  1. KPD x 4 cycles from 04/17/2018 to 08/21/2018. 2. Stem cell transplant on 10/23/2018. 3. Radiation to left and right humerus 25 Gy in 10 fractions from 08/23/2020 to 08/25/2020.  NGS Results: not done  CURRENT THERAPY: Maintenance Pomalyst 3/4 weeks  BRIEF ONCOLOGIC HISTORY:  Oncology History  Multiple myeloma without remission ([place])  04/15/2018 Initial Diagnosis   Multiple myeloma without remission ([place])   04/17/2018 - 09/05/2018 Chemotherapy   The patient had dexamethasone (DECADRON) 4 MG tablet, 1 of 1 cycle, Start date: 04/15/2018, End date: 05/26/2018 palonosetron (ALOXI) injection 0.25 mg, 0.25 mg, Intravenous,  Once, 1 of 1 cycle Administration: 0.25 mg (04/17/2018), 0.25 mg (04/24/2018), 0.25 mg (05/01/2018) cyclophosphamide (CYTOXAN) 540 mg in sodium chloride 0.9 % 250 mL chemo infusion, 300 mg/m2 = 540 mg, Intravenous,  Once, 1 of 1 cycle Administration: 540 mg (04/17/2018), 540 mg (04/24/2018), 540 mg (05/01/2018) carfilzomib (KYPROLIS) 36 mg in dextrose 5 % 50 mL chemo infusion, 20 mg/m2 = 36 mg, Intravenous, Once, 5 of 5 cycles Administration: 36 mg (04/17/2018), 36 mg (04/18/2018), 60 mg (04/24/2018), 60 mg (04/25/2018), 60 mg (05/01/2018), 60 mg (05/02/2018), 60 mg (05/15/2018), 60 mg (05/16/2018), 60 mg (06/12/2018), 60 mg (06/13/2018), 60 mg (06/26/2018), 60 mg (06/27/2018), 60 mg (07/03/2018), 60 mg (07/04/2018), 60 mg (07/24/2018), 60 mg (07/25/2018), 60 mg (07/31/2018), 60 mg (08/01/2018), 60 mg (08/08/2018), 60 mg (08/07/2018), 60 mg (08/21/2018), 60 mg (08/22/2018), 60 mg (08/28/2018), 60 mg (08/29/2018), 60 mg (09/04/2018), 60 mg (09/05/2018)   for chemotherapy treatment.       CANCER STAGING:  Cancer Staging  No  matching staging information was found for the patient.  INTERVAL HISTORY:  [name], a 75 y.o. female, returns for routine follow-up of her multiple myeloma. [name] was last seen on 05/31/2021.   Today she reports feeling good. She is taking Pomalyst and tolerating it well. She reports fatigue. She takes hydrocodone as needed for her back pain. She denies fevers, infections, n/v/d, ankle swellings, and jaw pain.   REVIEW OF SYSTEMS:  Review of Systems  Constitutional:  Positive for fatigue (50%). Negative for appetite change and fever.  Eyes:  Positive for eye problems (blurry vision).  Cardiovascular:  Negative for leg swelling.  Gastrointestinal:  Negative for diarrhea, nausea and vomiting.  Musculoskeletal:  Positive for back pain (4/10).  Neurological:  Positive for headaches.  Psychiatric/Behavioral:  Positive for sleep disturbance.   All other systems reviewed and are negative.  PAST MEDICAL/SURGICAL HISTORY:  Past Medical History:  Diagnosis Date   Anxiety    Depression    Hypertension    Multiple myeloma ([facility])    multiple myeloma   Past Surgical History:  Procedure Laterality Date   ABDOMINAL HYSTERECTOMY     total   APPENDECTOMY     LAPAROSCOPIC APPENDECTOMY N/A 05/20/2018   Procedure: APPENDECTOMY LAPAROSCOPIC;  Surgeon: [name], MD;  Location: [facility];  Service: General;  Laterality: N/A;   PORTACATH PLACEMENT Right 04/14/2018   Procedure: INSERTION PORT-A-CATH;  Surgeon: [name], MD;  Location: [facility];  Service: General;  Laterality: Right;    SOCIAL HISTORY:  Social History   Socioeconomic History   Marital status: Legally Separated    Spouse  name: Not on file   Number of children: 6   Years of education: Not on file   Highest education level: Not on file  Occupational History    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  Tobacco Use   Smoking status: Never   Smokeless tobacco: Never  Vaping Use   Vaping Use: Never used  Substance and  Sexual Activity   Alcohol use: Never   Drug use: Not Currently   Sexual activity: Not Currently  Other Topics Concern   Not on file  Social History Narrative   Not on file   Social Determinants of Health   Financial Resource Strain: Not on file  Food Insecurity: Not on file  Transportation Needs: Not on file  Physical Activity: Not on file  Stress: Not on file  Social Connections: Not on file  Intimate Partner Violence: Not At Risk   Fear of Current or Ex-Partner: No   Emotionally Abused: No   Physically Abused: No   Sexually Abused: No    FAMILY HISTORY:  Family History  Problem Relation Age of Onset   Heart disease Mother    Emphysema Father    Diabetes Sister    Depression Sister    Cancer Brother        liver, lung, and colon   Diabetes Brother     CURRENT MEDICATIONS:  Current Outpatient Medications  Medication Sig Dispense Refill   acyclovir (ZOVIRAX) 400 MG tablet 1 tablet     acyclovir (ZOVIRAX) 400 MG tablet Take 1 tablet (400 mg total) by mouth 2 (two) times daily. 60 tablet 4   albuterol (VENTOLIN HFA) 108 (90 Base) MCG/ACT inhaler Inhale 2 puffs into the lungs in the morning, at noon, in the evening, and at bedtime. 8 g 0   allopurinol (ZYLOPRIM) 300 MG tablet 1 tablet     ALPRAZolam (XANAX) 0.5 MG tablet Take one tablet during the day as needed for anxiety and two tablets at bedtime as needed for anxiety/insomnia 90 tablet 0   aspirin 81 MG chewable tablet 1 tablet     Calcium Carb-Cholecalciferol (CALCIUM 1000 + D PO) Take 1,000 mg by mouth daily. Take one tablet daily until next visit with oncologist.     ELIQUIS 5 MG TABS tablet Take 5 mg by mouth 2 (two) times daily.     escitalopram (LEXAPRO) 20 MG tablet 1 tablet (STOP citalopram)     HYDROcodone-acetaminophen (NORCO) 7.5-325 MG tablet Take 1 tablet by mouth every 6 (six) hours as needed for moderate pain. 120 tablet 0   Lidocaine-Prilocaine (EMLA EX) apply to affected area     loperamide (IMODIUM)  2 MG capsule Take 1 capsule (2 mg total) by mouth as needed for diarrhea or loose stools. 30 capsule 0   pomalidomide (POMALYST) 2 MG capsule 1 capsule     POMALYST 2 MG capsule TAKE 1 CAPSULE BY MOUTH ONCE DAILY 21 capsule 1   saccharomyces boulardii (FLORASTOR) 250 MG capsule Take 1 capsule (250 mg total) by mouth 2 (two) times daily. 20 capsule 0   terbinafine (LAMISIL) 1 % cream Apply 1 application topically 2 (two) times daily. 30 g 0   TERBINAFINE EX 1 application     zolpidem (AMBIEN) 10 MG tablet Take 1 tablet (10 mg total) by mouth at bedtime as needed for sleep. 30 tablet 2   rosuvastatin (CRESTOR) 10 MG tablet Take 10 mg by mouth daily.     No current facility-administered medications for this visit.    ALLERGIES:  Allergies  Allergen Reactions   Ciprofloxacin Anaphylaxis   Amoxicillin    Morphine    Morphine And Related Nausea And Vomiting   Augmentin [Amoxicillin-Pot Clavulanate] Other (See Comments)    Headache, insomnia    PHYSICAL EXAM:  Performance status (ECOG): 1 - Symptomatic but completely ambulatory  Vitals:   08/01/21 1411  BP: (!) 141/74  Pulse: 68  Resp: 18  Temp: 98.5 F (36.9 C)  SpO2: 96%   Wt Readings from Last 3 Encounters:  08/01/21 169 lb 4.8 oz (76.8 kg)  05/31/21 172 lb 9.6 oz (78.3 kg)  04/26/21 169 lb 3.2 oz (76.7 kg)   Physical Exam Vitals reviewed.  Constitutional:      Appearance: Normal appearance.  Cardiovascular:     Rate and Rhythm: Normal rate and regular rhythm.     Pulses: Normal pulses.     Heart sounds: Normal heart sounds.  Pulmonary:     Effort: Pulmonary effort is normal.     Breath sounds: Normal breath sounds.  Neurological:     General: No focal deficit present.     Mental Status: She is alert and oriented to person, place, and time.  Psychiatric:        Mood and Affect: Mood normal.        Behavior: Behavior normal.     LABORATORY DATA:  I have reviewed the labs as listed.  CBC Latest Ref Rng & Units  07/25/2021 06/07/2021 05/24/2021  WBC 4.0 - 10.5 K/uL 2.2(L) 3.0(L) 1.9(L)  Hemoglobin 12.0 - 15.0 g/dL 9.5(L) 10.1(L) 9.5(L)  Hematocrit 36.0 - 46.0 % 29.2(L) 31.4(L) 29.2(L)  Platelets 150 - 400 K/uL 92(L) 138(L) 106(L)   CMP Latest Ref Rng & Units 07/25/2021 05/24/2021 04/20/2021  Glucose 70 - 99 mg/dL 107(H) 128(H) 152(H)  BUN 8 - 23 mg/dL 25(H) 26(H) 27(H)  Creatinine 0.44 - 1.00 mg/dL 1.46(H) 1.56(H) 1.55(H)  Sodium 135 - 145 mmol/L 136 139 136  Potassium 3.5 - 5.1 mmol/L 4.0 3.9 3.9  Chloride 98 - 111 mmol/L 103 105 106  CO2 22 - 32 mmol/L 26 28 28   Calcium 8.9 - 10.3 mg/dL 8.4(L) 8.5(L) 8.3(L)  Total Protein 6.5 - 8.1 g/dL 6.5 6.7 6.6  Total Bilirubin 0.3 - 1.2 mg/dL 0.3 0.7 0.5  Alkaline Phos 38 - 126 U/L 59 62 70  AST 15 - 41 U/L 18 18 16   ALT 0 - 44 U/L 19 17 15     DIAGNOSTIC IMAGING:  I have independently reviewed the scans and discussed with the patient. No results found.   ASSESSMENT:  1.  IgG lambda plasma cell myeloma, stage II, standard risk: -4 cycles of KPD from 04/17/2018 through 08/21/2018, stem cell transplant on 10/23/2018. -PET scan on 01/21/2019 showed multiple bone lesions but without any hypermetabolic activity. -BMBX on 01/21/2019 with normocellular marrow with no increase in plasma cells.  Normal FISH.  MRD results negative. -Maintenance pomalidomide 2 mg 3 weeks on/1 week off started on 03/10/2019. -Bone marrow biopsy on 10/29/2019 shows trilineage hematopoiesis with no evidence of plasma cells.  Chromosome analysis and FISH are normal. -BM BX on 10/27/2020 at [facility] with 30 to 40%, TLH.  2% of the total cells are CD 138+ plasma cells. - Pomalyst was held due to dizziness from 03/29/2021 through 04/26/2021. - MRI of the brain on 03/31/2021 did not show any evidence of intracranial lesions.  Multiple bone lesions in the skull compatible with sequela of myeloma.   2.  Pulmonary embolism: -CT  angiogram on 04/21/2020 showed filling defect at [facility]. -VQ scan confirmed pulmonary embolism. -She is on Eliquis.   3.  Right shoulder and upper arm pain: - She received XRT to the chest from 12/26/2020 through 01/06/2021.   PLAN:  1.  IgG lambda plasma cell myeloma: - She started back Pomalyst 2 mg 3 weeks on 1 week off. - Reviewed myeloma panel from 07/25/2021.  M spike is negative.  Free light chain ratio is 1.12.  Immunofixation was normal. - CBC revealed a mild thrombocytopenia of 92.  Leukopenia 2.2.  She will continue Pomalyst at the same dose. - RTC 8 weeks for follow-up with myeloma labs 1 week prior.   2.  Back pain: - Continue hydrocodone 7.5/325 as needed..   3.  Myeloma bone disease: - Denosumab held due to exposure of the bone in the right lower rib. - Continue calcium and vitamin D supplements.   4.  Anxiety: - Continue Xanax twice daily as needed.   5.  Sleeping difficulty: - Continue Ambien 10 mg at bedtime as needed.   6.  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 - Last ferritin 499 with percent saturation 23 on 06/09/2021.  F37 and folic acid were normal on 04/20/2021. - Will repeat ferritin, iron panel at next visit.   7.  Pulmonary embolism: - Continue Eliquis.  No bleeding issues.   Orders placed this encounter:  No orders of the defined types were placed in this encounter.    [name], MD [place] [phone]   I, Thana Ates, am acting as a scribe for Dr. [name].  I, [name] MD, have reviewed the above documentation for accuracy and completeness, and I agree with the above.

## 2021-08-01 ENCOUNTER — Inpatient Hospital Stay (HOSPITAL_BASED_OUTPATIENT_CLINIC_OR_DEPARTMENT_OTHER): Payer: Medicare Other | Admitting: Hematology

## 2021-08-01 ENCOUNTER — Other Ambulatory Visit (HOSPITAL_COMMUNITY): Payer: Self-pay

## 2021-08-01 VITALS — BP 141/74 | HR 68 | Temp 98.5°F | Resp 18 | Ht 64.0 in | Wt 169.3 lb

## 2021-08-01 DIAGNOSIS — C9001 Multiple myeloma in remission: Secondary | ICD-10-CM | POA: Diagnosis not present

## 2021-08-01 DIAGNOSIS — E538 Deficiency of other specified B group vitamins: Secondary | ICD-10-CM

## 2021-08-01 DIAGNOSIS — C9000 Multiple myeloma not having achieved remission: Secondary | ICD-10-CM | POA: Diagnosis not present

## 2021-08-01 MED ORDER — HYDROCODONE-ACETAMINOPHEN 7.5-325 MG PO TABS: 1.0000 | ORAL_TABLET | Freq: Four times a day (QID) | ORAL | 0 refills | Status: DC | PRN

## 2021-08-01 NOTE — Patient Instructions (Signed)
[place] at [facility] Discharge Instructions  You were seen and examined today by Dr. Delton Coombes.  Continue taking Pomalyst. Dr. Delton Coombes will see you again in 8 weeks with labs one week prior.   Thank you for choosing [place] at [facility] to provide your oncology and hematology care.  To afford each patient quality time with our provider, please arrive at least 15 minutes before your scheduled appointment time.   If you have a lab appointment with the [facility] please come in thru the Main Entrance and check in at the main information desk.  You need to re-schedule your appointment should you arrive 10 or more minutes late.  We strive to give you quality time with our providers, and arriving late affects you and other patients whose appointments are after yours.  Also, if you no show three or more times for appointments you may be dismissed from the clinic at the providers discretion.     Again, thank you for choosing [facility].  Our hope is that these requests will decrease the amount of time that you wait before being seen by our physicians.       _____________________________________________________________  Should you have questions after your visit to [facility], please contact our office at [phone] and follow the prompts.  Our office hours are 8:00 a.m. and 4:30 p.m. Monday - Friday.  Please note that voicemails left after 4:00 p.m. may not be returned until the following business day.  We are closed weekends and major holidays.  You do have access to a nurse 24-7, just call the main number to the clinic [phone] and do not press any options, hold on the line and a nurse will answer the phone.    For prescription refill requests, have your pharmacy contact our office and allow 72 hours.    Due to Covid, you will need to wear a mask upon entering the hospital. If you do not have a mask, a  mask will be given to you at the Main Entrance upon arrival. For doctor visits, patients may have 1 support person age 17 or older with them. For treatment visits, patients can not have anyone with them due to social distancing guidelines and our immunocompromised population.

## 2021-08-02 ENCOUNTER — Other Ambulatory Visit (HOSPITAL_COMMUNITY): Payer: Self-pay

## 2021-08-02 DIAGNOSIS — F419 Anxiety disorder, unspecified: Secondary | ICD-10-CM

## 2021-08-02 DIAGNOSIS — C9000 Multiple myeloma not having achieved remission: Secondary | ICD-10-CM

## 2021-08-02 MED ORDER — ALPRAZOLAM 0.5 MG PO TABS: ORAL_TABLET | ORAL | 3 refills | Status: DC

## 2021-08-10 ENCOUNTER — Other Ambulatory Visit (HOSPITAL_COMMUNITY): Payer: Self-pay | Admitting: Hematology

## 2021-08-10 DIAGNOSIS — C9000 Multiple myeloma not having achieved remission: Secondary | ICD-10-CM

## 2021-08-10 NOTE — Telephone Encounter (Signed)
Chart reviewed. Pomalyst refilled per last office note with Dr. Katragadda.  

## 2021-08-23 ENCOUNTER — Other Ambulatory Visit (HOSPITAL_COMMUNITY): Payer: Self-pay

## 2021-08-23 DIAGNOSIS — G479 Sleep disorder, unspecified: Secondary | ICD-10-CM

## 2021-08-23 MED ORDER — ZOLPIDEM TARTRATE 10 MG PO TABS: 10.0000 mg | ORAL_TABLET | Freq: Every evening | ORAL | 2 refills | Status: DC | PRN

## 2021-08-30 ENCOUNTER — Other Ambulatory Visit (HOSPITAL_COMMUNITY): Payer: Self-pay

## 2021-08-30 MED ORDER — HYDROCODONE-ACETAMINOPHEN 7.5-325 MG PO TABS: 1.0000 | ORAL_TABLET | Freq: Four times a day (QID) | ORAL | 0 refills | Status: DC | PRN

## 2021-09-12 ENCOUNTER — Other Ambulatory Visit (HOSPITAL_COMMUNITY): Payer: Self-pay | Admitting: Hematology

## 2021-09-12 DIAGNOSIS — C9000 Multiple myeloma not having achieved remission: Secondary | ICD-10-CM

## 2021-09-12 NOTE — Telephone Encounter (Signed)
Chart reviewed. Pomalyst refilled per last office note with Dr. Katragadda.  

## 2021-09-19 ENCOUNTER — Inpatient Hospital Stay (HOSPITAL_COMMUNITY): Payer: Medicare Other | Attending: Hematology

## 2021-09-19 ENCOUNTER — Other Ambulatory Visit: Payer: Self-pay

## 2021-09-19 DIAGNOSIS — C9001 Multiple myeloma in remission: Secondary | ICD-10-CM

## 2021-09-19 DIAGNOSIS — Z452 Encounter for adjustment and management of vascular access device: Secondary | ICD-10-CM | POA: Insufficient documentation

## 2021-09-19 DIAGNOSIS — C9000 Multiple myeloma not having achieved remission: Secondary | ICD-10-CM | POA: Insufficient documentation

## 2021-09-19 LAB — CBC WITH DIFFERENTIAL/PLATELET
Abs Immature Granulocytes: 0.01 10*3/uL (ref 0.00–0.07)
Basophils Absolute: 0 10*3/uL (ref 0.0–0.1)
Basophils Relative: 2 %
Eosinophils Absolute: 0.3 10*3/uL (ref 0.0–0.5)
Eosinophils Relative: 14 %
HCT: 29.3 % — ABNORMAL LOW (ref 36.0–46.0)
Hemoglobin: 9.3 g/dL — ABNORMAL LOW (ref 12.0–15.0)
Immature Granulocytes: 0 %
Lymphocytes Relative: 35 %
Lymphs Abs: 0.9 10*3/uL (ref 0.7–4.0)
MCH: 33.2 pg (ref 26.0–34.0)
MCHC: 31.7 g/dL (ref 30.0–36.0)
MCV: 104.6 fL — ABNORMAL HIGH (ref 80.0–100.0)
Monocytes Absolute: 0.4 10*3/uL (ref 0.1–1.0)
Monocytes Relative: 18 %
Neutro Abs: 0.8 10*3/uL — ABNORMAL LOW (ref 1.7–7.7)
Neutrophils Relative %: 31 %
Platelets: 101 10*3/uL — ABNORMAL LOW (ref 150–400)
RBC: 2.8 MIL/uL — ABNORMAL LOW (ref 3.87–5.11)
RDW: 13.2 % (ref 11.5–15.5)
WBC: 2.4 10*3/uL — ABNORMAL LOW (ref 4.0–10.5)
nRBC: 0 % (ref 0.0–0.2)

## 2021-09-19 LAB — COMPREHENSIVE METABOLIC PANEL
ALT: 18 U/L (ref 0–44)
AST: 20 U/L (ref 15–41)
Albumin: 3.9 g/dL (ref 3.5–5.0)
Alkaline Phosphatase: 52 U/L (ref 38–126)
Anion gap: 4 — ABNORMAL LOW (ref 5–15)
BUN: 24 mg/dL — ABNORMAL HIGH (ref 8–23)
CO2: 29 mmol/L (ref 22–32)
Calcium: 8.5 mg/dL — ABNORMAL LOW (ref 8.9–10.3)
Chloride: 106 mmol/L (ref 98–111)
Creatinine, Ser: 1.45 mg/dL — ABNORMAL HIGH (ref 0.44–1.00)
GFR, Estimated: 38 mL/min — ABNORMAL LOW (ref >60)
Glucose, Bld: 126 mg/dL — ABNORMAL HIGH (ref 70–99)
Potassium: 4 mmol/L (ref 3.5–5.1)
Sodium: 139 mmol/L (ref 135–145)
Total Bilirubin: 0.3 mg/dL (ref 0.3–1.2)
Total Protein: 6.6 g/dL (ref 6.5–8.1)

## 2021-09-19 LAB — LACTATE DEHYDROGENASE: LDH: 127 U/L (ref 98–192)

## 2021-09-19 LAB — IRON AND TIBC
Iron: 86 ug/dL (ref 28–170)
Saturation Ratios: 31 % (ref 10.4–31.8)
TIBC: 280 ug/dL (ref 250–450)
UIBC: 194 ug/dL

## 2021-09-19 LAB — FERRITIN: Ferritin: 369 ng/mL — ABNORMAL HIGH (ref 11–307)

## 2021-09-19 MED ORDER — SODIUM CHLORIDE 0.9% FLUSH
10.0000 mL | Freq: Once | INTRAVENOUS | Status: AC
Start: 2021-09-19 — End: 2021-09-19
  Administered 2021-09-19: 10 mL via INTRAVENOUS

## 2021-09-19 MED ORDER — HEPARIN SOD (PORK) LOCK FLUSH 100 UNIT/ML IV SOLN
500.0000 [IU] | Freq: Once | INTRAVENOUS | Status: AC
  Administered 2021-09-19: 500 [IU] via INTRAVENOUS

## 2021-09-19 NOTE — Progress Notes (Signed)
Patients port flushed without difficulty.  Good blood return noted with no bruising or swelling noted at site.  Band aid applied.  VSS with discharge and left in satisfactory condition with no s/s of distress noted.   

## 2021-09-19 NOTE — Patient Instructions (Signed)
[facility]  Discharge Instructions: Thank you for choosing [facility] to provide your oncology and hematology care.  If you have a lab appointment with the [facility], please come in thru the Main Entrance and check in at the main information desk.  Wear comfortable clothing and clothing appropriate for easy access to any Portacath or PICC line.   We strive to give you quality time with your provider. You may need to reschedule your appointment if you arrive late (15 or more minutes).  Arriving late affects you and other patients whose appointments are after yours.  Also, if you miss three or more appointments without notifying the office, you may be dismissed from the clinic at the providers discretion.      For prescription refill requests, have your pharmacy contact our office and allow 72 hours for refills to be completed.    Today you received the following chemotherapy and/or immunotherapy agents port flush labs      To help prevent nausea and vomiting after your treatment, we encourage you to take your nausea medication as directed.  BELOW ARE SYMPTOMS THAT SHOULD BE REPORTED IMMEDIATELY: *FEVER GREATER THAN 100.4 F (38 C) OR HIGHER *CHILLS OR SWEATING *NAUSEA AND VOMITING THAT IS NOT CONTROLLED WITH YOUR NAUSEA MEDICATION *UNUSUAL SHORTNESS OF BREATH *UNUSUAL BRUISING OR BLEEDING *URINARY PROBLEMS (pain or burning when urinating, or frequent urination) *BOWEL PROBLEMS (unusual diarrhea, constipation, pain near the anus) TENDERNESS IN MOUTH AND THROAT WITH OR WITHOUT PRESENCE OF ULCERS (sore throat, sores in mouth, or a toothache) UNUSUAL RASH, SWELLING OR PAIN  UNUSUAL VAGINAL DISCHARGE OR ITCHING   Items with * indicate a potential emergency and should be followed up as soon as possible or go to the Emergency Department if any problems should occur.  Please show the CHEMOTHERAPY ALERT CARD or IMMUNOTHERAPY ALERT CARD at check-in to the Emergency  Department and triage nurse.  Should you have questions after your visit or need to cancel or reschedule your appointment, please contact [facility] [phone]  and follow the prompts.  Office hours are 8:00 a.m. to 4:30 p.m. Monday - Friday. Please note that voicemails left after 4:00 p.m. may not be returned until the following business day.  We are closed weekends and major holidays. You have access to a nurse at all times for urgent questions. Please call the main number to the clinic [phone] and follow the prompts.  For any non-urgent questions, you may also contact your provider using MyChart. We now offer e-Visits for anyone 9 and older to request care online for non-urgent symptoms. For details visit [email or web address].   Also download the MyChart app! Go to the app store, search "MyChart", open the app, select [facility], and log in with your MyChart username and password.  Due to Covid, a mask is required upon entering the hospital/clinic. If you do not have a mask, one will be given to you upon arrival. For doctor visits, patients may have 1 support person aged 9 or older with them. For treatment visits, patients cannot have anyone with them due to current Covid guidelines and our immunocompromised population.

## 2021-09-20 LAB — PROTEIN ELECTROPHORESIS, SERUM
A/G Ratio: 1.3 (ref 0.7–1.7)
Albumin ELP: 3.6 g/dL (ref 2.9–4.4)
Alpha-1-Globulin: 0.2 g/dL (ref 0.0–0.4)
Alpha-2-Globulin: 0.8 g/dL (ref 0.4–1.0)
Beta Globulin: 0.8 g/dL (ref 0.7–1.3)
Gamma Globulin: 0.9 g/dL (ref 0.4–1.8)
Globulin, Total: 2.7 g/dL (ref 2.2–3.9)
Total Protein ELP: 6.3 g/dL (ref 6.0–8.5)

## 2021-09-20 LAB — KAPPA/LAMBDA LIGHT CHAINS
Kappa free light chain: 43.6 mg/L — ABNORMAL HIGH (ref 3.3–19.4)
Kappa, lambda light chain ratio: 1.12 (ref 0.26–1.65)
Lambda free light chains: 39 mg/L — ABNORMAL HIGH (ref 5.7–26.3)

## 2021-09-22 LAB — IMMUNOFIXATION ELECTROPHORESIS
IgA: 84 mg/dL (ref 64–422)
IgG (Immunoglobin G), Serum: 943 mg/dL (ref 586–1602)
IgM (Immunoglobulin M), Srm: 25 mg/dL — ABNORMAL LOW (ref 26–217)
Total Protein ELP: 6.5 g/dL (ref 6.0–8.5)

## 2021-09-26 ENCOUNTER — Other Ambulatory Visit: Payer: Self-pay

## 2021-09-26 ENCOUNTER — Inpatient Hospital Stay (HOSPITAL_COMMUNITY): Payer: Medicare Other | Attending: Hematology | Admitting: Hematology

## 2021-09-26 VITALS — BP 120/74 | HR 64 | Temp 98.6°F | Resp 18 | Ht 64.0 in | Wt 174.9 lb

## 2021-09-26 DIAGNOSIS — I2699 Other pulmonary embolism without acute cor pulmonale: Secondary | ICD-10-CM | POA: Diagnosis not present

## 2021-09-26 DIAGNOSIS — Z7901 Long term (current) use of anticoagulants: Secondary | ICD-10-CM | POA: Insufficient documentation

## 2021-09-26 DIAGNOSIS — C9001 Multiple myeloma in remission: Secondary | ICD-10-CM | POA: Diagnosis not present

## 2021-09-26 DIAGNOSIS — C9000 Multiple myeloma not having achieved remission: Secondary | ICD-10-CM | POA: Diagnosis not present

## 2021-09-26 DIAGNOSIS — D631 Anemia in chronic kidney disease: Secondary | ICD-10-CM | POA: Diagnosis not present

## 2021-09-26 DIAGNOSIS — N189 Chronic kidney disease, unspecified: Secondary | ICD-10-CM | POA: Insufficient documentation

## 2021-09-26 NOTE — Patient Instructions (Signed)
[place] at [facility] Discharge Instructions   You were seen and examined today by Dr. Delton Coombes.  He reviewed your lab work which is normal/stable.  Continue Pomalyst as prescribed.  Return as scheduled in 3 months.    Thank you for choosing [place] at [facility] to provide your oncology and hematology care.  To afford each patient quality time with our provider, please arrive at least 15 minutes before your scheduled appointment time.   If you have a lab appointment with the [facility] please come in thru the Main Entrance and check in at the main information desk.  You need to re-schedule your appointment should you arrive 10 or more minutes late.  We strive to give you quality time with our providers, and arriving late affects you and other patients whose appointments are after yours.  Also, if you no show three or more times for appointments you may be dismissed from the clinic at the providers discretion.     Again, thank you for choosing [facility].  Our hope is that these requests will decrease the amount of time that you wait before being seen by our physicians.       _____________________________________________________________  Should you have questions after your visit to [facility], please contact our office at [phone] and follow the prompts.  Our office hours are 8:00 a.m. and 4:30 p.m. Monday - Friday.  Please note that voicemails left after 4:00 p.m. may not be returned until the following business day.  We are closed weekends and major holidays.  You do have access to a nurse 24-7, just call the main number to the clinic [phone] and do not press any options, hold on the line and a nurse will answer the phone.    For prescription refill requests, have your pharmacy contact our office and allow 72 hours.    Due to Covid, you will need to wear a mask upon entering the hospital. If  you do not have a mask, a mask will be given to you at the Main Entrance upon arrival. For doctor visits, patients may have 1 support person age 79 or older with them. For treatment visits, patients can not have anyone with them due to social distancing guidelines and our immunocompromised population.

## 2021-09-26 NOTE — Progress Notes (Signed)
[place], [place] [address]   CLINIC:  Medical Oncology/Hematology  PCP:  [facility] / [place] [address] [phone]   REASON FOR VISIT:  Follow-up for multiple myeloma  PRIOR THERAPY:  1. KPD x 4 cycles from 04/17/2018 to 08/21/2018. 2. Stem cell transplant on 10/23/2018. 3. Radiation to left and right humerus 25 Gy in 10 fractions from 08/23/2020 to 08/25/2020.  NGS Results: not done  CURRENT THERAPY: Maintenance Pomalyst 3/4 weeks  BRIEF ONCOLOGIC HISTORY:  Oncology History  Multiple myeloma without remission ([place])  04/15/2018 Initial Diagnosis   Multiple myeloma without remission ([place])   04/17/2018 - 09/05/2018 Chemotherapy   The patient had dexamethasone (DECADRON) 4 MG tablet, 1 of 1 cycle, Start date: 04/15/2018, End date: 05/26/2018 palonosetron (ALOXI) injection 0.25 mg, 0.25 mg, Intravenous,  Once, 1 of 1 cycle Administration: 0.25 mg (04/17/2018), 0.25 mg (04/24/2018), 0.25 mg (05/01/2018) cyclophosphamide (CYTOXAN) 540 mg in sodium chloride 0.9 % 250 mL chemo infusion, 300 mg/m2 = 540 mg, Intravenous,  Once, 1 of 1 cycle Administration: 540 mg (04/17/2018), 540 mg (04/24/2018), 540 mg (05/01/2018) carfilzomib (KYPROLIS) 36 mg in dextrose 5 % 50 mL chemo infusion, 20 mg/m2 = 36 mg, Intravenous, Once, 5 of 5 cycles Administration: 36 mg (04/17/2018), 36 mg (04/18/2018), 60 mg (04/24/2018), 60 mg (04/25/2018), 60 mg (05/01/2018), 60 mg (05/02/2018), 60 mg (05/15/2018), 60 mg (05/16/2018), 60 mg (06/12/2018), 60 mg (06/13/2018), 60 mg (06/26/2018), 60 mg (06/27/2018), 60 mg (07/03/2018), 60 mg (07/04/2018), 60 mg (07/24/2018), 60 mg (07/25/2018), 60 mg (07/31/2018), 60 mg (08/01/2018), 60 mg (08/08/2018), 60 mg (08/07/2018), 60 mg (08/21/2018), 60 mg (08/22/2018), 60 mg (08/28/2018), 60 mg (08/29/2018), 60 mg (09/04/2018), 60 mg (09/05/2018)   for chemotherapy treatment.       CANCER STAGING: Cancer Staging  No matching  staging information was found for the patient.  INTERVAL HISTORY:  [name], a 76 y.o. female, returns for routine follow-up of her multiple myeloma. Alexandra Price was last seen on 08/01/2021.   Today she reports feeling good. She denies any new pains. Her weight is stable. She is taking Pomalyst and tolerating it well. She reports occasional constipation, and she denies diarrhea. She reports mild tingling/numbness in her right foot.  REVIEW OF SYSTEMS:  Review of Systems  Constitutional:  Negative for appetite change, fatigue and unexpected weight change.  Gastrointestinal:  Positive for constipation. Negative for diarrhea.  Musculoskeletal:  Positive for back pain (4/10) and flank pain (R side 4/10).  Neurological:  Positive for headaches and numbness (R foot).  Psychiatric/Behavioral:  Positive for depression and sleep disturbance. The patient is nervous/anxious.   All other systems reviewed and are negative.  PAST MEDICAL/SURGICAL HISTORY:  Past Medical History:  Diagnosis Date   Anxiety    Depression    Hypertension    Multiple myeloma ([place])    multiple myeloma   Past Surgical History:  Procedure Laterality Date   ABDOMINAL HYSTERECTOMY     total   APPENDECTOMY     LAPAROSCOPIC APPENDECTOMY N/A 05/20/2018   Procedure: APPENDECTOMY LAPAROSCOPIC;  Surgeon: [name], MD;  Location: [facility];  Service: General;  Laterality: N/A;   PORTACATH PLACEMENT Right 04/14/2018   Procedure: INSERTION PORT-A-CATH;  Surgeon: [name], MD;  Location: [facility];  Service: General;  Laterality: Right;    SOCIAL HISTORY:  Social History   Socioeconomic History   Marital status: Legally Separated    Spouse  name: Not on file   Number of children: 6   Years of education: Not on file   Highest education level: Not on file  Occupational History    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  Tobacco Use   Smoking status: Never   Smokeless tobacco: Never  Vaping Use   Vaping Use: Never used   Substance and Sexual Activity   Alcohol use: Never   Drug use: Not Currently   Sexual activity: Not Currently  Other Topics Concern   Not on file  Social History Narrative   Not on file   Social Determinants of Health   Financial Resource Strain: Not on file  Food Insecurity: Not on file  Transportation Needs: Not on file  Physical Activity: Not on file  Stress: Not on file  Social Connections: Not on file  Intimate Partner Violence: Not on file    FAMILY HISTORY:  Family History  Problem Relation Age of Onset   Heart disease Mother    Emphysema Father    Diabetes Sister    Depression Sister    Cancer Brother        liver, lung, and colon   Diabetes Brother     CURRENT MEDICATIONS:  Current Outpatient Medications  Medication Sig Dispense Refill   acyclovir (ZOVIRAX) 400 MG tablet 1 tablet     acyclovir (ZOVIRAX) 400 MG tablet Take 1 tablet (400 mg total) by mouth 2 (two) times daily. 60 tablet 4   albuterol (VENTOLIN HFA) 108 (90 Base) MCG/ACT inhaler Inhale 2 puffs into the lungs in the morning, at noon, in the evening, and at bedtime. 8 g 0   allopurinol (ZYLOPRIM) 300 MG tablet 1 tablet     ALPRAZolam (XANAX) 0.5 MG tablet Take one tablet during the day as needed for anxiety and two tablets at bedtime as needed for anxiety/insomnia 90 tablet 3   aspirin 81 MG chewable tablet 1 tablet     Calcium Carb-Cholecalciferol (CALCIUM 1000 + D PO) Take 1,000 mg by mouth daily. Take one tablet daily until next visit with oncologist.     ELIQUIS 5 MG TABS tablet Take 5 mg by mouth 2 (two) times daily.     escitalopram (LEXAPRO) 20 MG tablet 1 tablet (STOP citalopram)     HYDROcodone-acetaminophen (NORCO) 7.5-325 MG tablet Take 1 tablet by mouth every 6 (six) hours as needed for moderate pain. 120 tablet 0   Lidocaine-Prilocaine (EMLA EX) apply to affected area     loperamide (IMODIUM) 2 MG capsule Take 1 capsule (2 mg total) by mouth as needed for diarrhea or loose stools. 30  capsule 0   pantoprazole (PROTONIX) 40 MG tablet Take 40 mg by mouth daily.     pomalidomide (POMALYST) 2 MG capsule 1 capsule     POMALYST 2 MG capsule TAKE 1 CAPSULE BY MOUTH ONCE DAILY 21 capsule 0   saccharomyces boulardii (FLORASTOR) 250 MG capsule Take 1 capsule (250 mg total) by mouth 2 (two) times daily. 20 capsule 0   terbinafine (LAMISIL) 1 % cream Apply 1 application topically 2 (two) times daily. 30 g 0   TERBINAFINE EX 1 application     zolpidem (AMBIEN) 10 MG tablet Take 1 tablet (10 mg total) by mouth at bedtime as needed for sleep. 30 tablet 2   rosuvastatin (CRESTOR) 10 MG tablet Take 10 mg by mouth daily.     No current facility-administered medications for this visit.    ALLERGIES:  Allergies  Allergen Reactions   Ciprofloxacin  Anaphylaxis   Amoxicillin    Morphine    Morphine And Related Nausea And Vomiting   Augmentin [Amoxicillin-Pot Clavulanate] Other (See Comments)    Headache, insomnia    PHYSICAL EXAM:  Performance status (ECOG): 1 - Symptomatic but completely ambulatory  Vitals:   09/26/21 1150  BP: 120/74  Pulse: 64  Resp: 18  Temp: 98.6 F (37 C)  SpO2: 99%   Wt Readings from Last 3 Encounters:  09/26/21 174 lb 14.4 oz (79.3 kg)  08/01/21 169 lb 4.8 oz (76.8 kg)  05/31/21 172 lb 9.6 oz (78.3 kg)   Physical Exam Vitals reviewed.  Constitutional:      Appearance: Normal appearance. She is obese.  Cardiovascular:     Rate and Rhythm: Normal rate and regular rhythm.     Pulses: Normal pulses.     Heart sounds: Normal heart sounds.  Pulmonary:     Effort: Pulmonary effort is normal.     Breath sounds: Normal breath sounds.  Neurological:     General: No focal deficit present.     Mental Status: She is alert and oriented to person, place, and time.  Psychiatric:        Mood and Affect: Mood normal.        Behavior: Behavior normal.     LABORATORY DATA:  I have reviewed the labs as listed.  CBC Latest Ref Rng & Units 09/19/2021  07/25/2021 06/07/2021  WBC 4.0 - 10.5 K/uL 2.4(L) 2.2(L) 3.0(L)  Hemoglobin 12.0 - 15.0 g/dL 9.3(L) 9.5(L) 10.1(L)  Hematocrit 36.0 - 46.0 % 29.3(L) 29.2(L) 31.4(L)  Platelets 150 - 400 K/uL 101(L) 92(L) 138(L)   CMP Latest Ref Rng & Units 09/19/2021 07/25/2021 05/24/2021  Glucose 70 - 99 mg/dL 126(H) 107(H) 128(H)  BUN 8 - 23 mg/dL 24(H) 25(H) 26(H)  Creatinine 0.44 - 1.00 mg/dL 1.45(H) 1.46(H) 1.56(H)  Sodium 135 - 145 mmol/L 139 136 139  Potassium 3.5 - 5.1 mmol/L 4.0 4.0 3.9  Chloride 98 - 111 mmol/L 106 103 105  CO2 22 - 32 mmol/L 29 26 28   Calcium 8.9 - 10.3 mg/dL 8.5(L) 8.4(L) 8.5(L)  Total Protein 6.5 - 8.1 g/dL 6.6 6.5 6.7  Total Bilirubin 0.3 - 1.2 mg/dL 0.3 0.3 0.7  Alkaline Phos 38 - 126 U/L 52 59 62  AST 15 - 41 U/L 20 18 18   ALT 0 - 44 U/L 18 19 17     DIAGNOSTIC IMAGING:  I have independently reviewed the scans and discussed with the patient. No results found.   ASSESSMENT:  1.  IgG lambda plasma cell myeloma, stage II, standard risk: -4 cycles of KPD from 04/17/2018 through 08/21/2018, stem cell transplant on 10/23/2018. -PET scan on 01/21/2019 showed multiple bone lesions but without any hypermetabolic activity. -BMBX on 01/21/2019 with normocellular marrow with no increase in plasma cells.  Normal FISH.  MRD results negative. -Maintenance pomalidomide 2 mg 3 weeks on/1 week off started on 03/10/2019. -Bone marrow biopsy on 10/29/2019 shows trilineage hematopoiesis with no evidence of plasma cells.  Chromosome analysis and FISH are normal. -BM BX on 10/27/2020 at [facility] with 30 to 40%, TLH.  2% of the total cells are CD 138+ plasma cells. - Pomalyst was held due to dizziness from 03/29/2021 through 04/26/2021. - MRI of the brain on 03/31/2021 did not show any evidence of intracranial lesions.  Multiple bone lesions in the skull compatible with sequela of myeloma.   2.  Pulmonary embolism: -CT angiogram on 04/21/2020 showed filling  defect at [facility]. -VQ scan confirmed pulmonary embolism. -She is on Eliquis.   3.  Right shoulder and upper arm pain: - She received XRT to the chest from 12/26/2020 through 01/06/2021.   PLAN:  1.  IgG lambda plasma cell myeloma: - She is taking Pomalyst 2 mg 3 weeks on/1 week off. - Reviewed myeloma panel from 09/19/2021.  M spike is negative.  Immunofixation became positive for the first time with IgG lambda.  Free light chain ratio is normal at 1.12. - Recommend continuing Pomalyst at this time.  RTC 3 months with repeat myeloma labs.   2.  Back pain: - Continue hydrocodone 7/325 as needed.   3.  Myeloma bone disease: - Denosumab held due to exposure of bone in the right lower jaw.  Continue calcium and vitamin D.   4.  Anxiety: - Continue Xanax twice daily as needed.   5.  Sleeping difficulty: - Continue Ambien at bedtime as needed.   6.  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 - Ferritin is 369 and percent saturation 31. - Hemoglobin is stable between 9 and 10.   7.  Pulmonary embolism: - Continue Eliquis.  No bleeding issues.   Orders placed this encounter:  No orders of the defined types were placed in this encounter.    [name], MD [place] [phone]   I, Thana Ates, am acting as a scribe for Dr. [name].  I, [name] MD, have reviewed the above documentation for accuracy and completeness, and I agree with the above.

## 2021-09-28 ENCOUNTER — Other Ambulatory Visit (HOSPITAL_COMMUNITY): Payer: Self-pay

## 2021-09-28 DIAGNOSIS — C9000 Multiple myeloma not having achieved remission: Secondary | ICD-10-CM

## 2021-09-28 DIAGNOSIS — F419 Anxiety disorder, unspecified: Secondary | ICD-10-CM

## 2021-09-29 ENCOUNTER — Encounter (HOSPITAL_COMMUNITY): Payer: Self-pay | Admitting: Hematology

## 2021-09-29 MED ORDER — HYDROCODONE-ACETAMINOPHEN 7.5-325 MG PO TABS: 1.0000 | ORAL_TABLET | Freq: Four times a day (QID) | ORAL | 0 refills | Status: DC | PRN

## 2021-09-29 MED ORDER — ALPRAZOLAM 0.5 MG PO TABS: ORAL_TABLET | ORAL | 3 refills | Status: DC

## 2021-10-13 ENCOUNTER — Other Ambulatory Visit (HOSPITAL_COMMUNITY): Payer: Self-pay | Admitting: Hematology

## 2021-10-13 DIAGNOSIS — C9000 Multiple myeloma not having achieved remission: Secondary | ICD-10-CM

## 2021-10-13 NOTE — Telephone Encounter (Signed)
Chart reviewed. Pomalyst refilled per last office note with Dr. Katragadda.  

## 2021-10-23 ENCOUNTER — Other Ambulatory Visit (HOSPITAL_COMMUNITY): Payer: Self-pay | Admitting: Internal Medicine

## 2021-10-23 DIAGNOSIS — Z1231 Encounter for screening mammogram for malignant neoplasm of breast: Secondary | ICD-10-CM

## 2021-10-26 ENCOUNTER — Other Ambulatory Visit (HOSPITAL_COMMUNITY): Payer: Self-pay

## 2021-10-26 MED ORDER — HYDROCODONE-ACETAMINOPHEN 7.5-325 MG PO TABS: 1.0000 | ORAL_TABLET | Freq: Four times a day (QID) | ORAL | 0 refills | Status: DC | PRN

## 2021-10-30 ENCOUNTER — Ambulatory Visit (HOSPITAL_COMMUNITY)
Admission: RE | Admit: 2021-10-30 | Discharge: 2021-10-30 | Disposition: A | Discharge status: Home / Self Care | Payer: Medicare Other | Source: Ambulatory Visit | Attending: Internal Medicine | Admitting: Internal Medicine

## 2021-10-30 ENCOUNTER — Other Ambulatory Visit: Payer: Self-pay

## 2021-10-30 DIAGNOSIS — Z1231 Encounter for screening mammogram for malignant neoplasm of breast: Secondary | ICD-10-CM | POA: Insufficient documentation

## 2021-11-02 ENCOUNTER — Encounter: Payer: Self-pay | Admitting: Internal Medicine

## 2021-11-13 ENCOUNTER — Other Ambulatory Visit (HOSPITAL_COMMUNITY): Payer: Self-pay | Admitting: Hematology

## 2021-11-13 DIAGNOSIS — C9000 Multiple myeloma not having achieved remission: Secondary | ICD-10-CM

## 2021-11-14 ENCOUNTER — Encounter (HOSPITAL_COMMUNITY): Payer: Self-pay | Admitting: Hematology

## 2021-11-14 MED ORDER — POMALIDOMIDE 2 MG PO CAPS: 2.0000 mg | ORAL_CAPSULE | Freq: Every day | ORAL | 0 refills | Status: DC

## 2021-11-14 NOTE — Telephone Encounter (Signed)
Chart reviewed. Pomalyst refilled per last office note with Dr. Katragadda.  

## 2021-11-16 DIAGNOSIS — M545 Low back pain, unspecified: Secondary | ICD-10-CM | POA: Diagnosis present

## 2021-11-16 DIAGNOSIS — E119 Type 2 diabetes mellitus without complications: Secondary | ICD-10-CM

## 2021-11-20 ENCOUNTER — Other Ambulatory Visit (HOSPITAL_COMMUNITY): Payer: Self-pay

## 2021-11-20 DIAGNOSIS — G479 Sleep disorder, unspecified: Secondary | ICD-10-CM

## 2021-11-20 MED ORDER — ZOLPIDEM TARTRATE 10 MG PO TABS: 10.0000 mg | ORAL_TABLET | Freq: Every evening | ORAL | 2 refills | Status: DC | PRN

## 2021-11-20 MED ORDER — HYDROCODONE-ACETAMINOPHEN 7.5-325 MG PO TABS: 1.0000 | ORAL_TABLET | Freq: Four times a day (QID) | ORAL | 0 refills | Status: DC | PRN

## 2021-11-22 ENCOUNTER — Inpatient Hospital Stay (HOSPITAL_COMMUNITY): Payer: Medicare Other | Attending: Hematology | Admitting: Hematology

## 2021-11-22 VITALS — BP 140/73 | HR 53 | Temp 96.7°F | Resp 18 | Ht 64.02 in | Wt 174.1 lb

## 2021-11-22 DIAGNOSIS — Z818 Family history of other mental and behavioral disorders: Secondary | ICD-10-CM | POA: Insufficient documentation

## 2021-11-22 DIAGNOSIS — D649 Anemia, unspecified: Secondary | ICD-10-CM | POA: Insufficient documentation

## 2021-11-22 DIAGNOSIS — M5489 Other dorsalgia: Secondary | ICD-10-CM | POA: Diagnosis not present

## 2021-11-22 DIAGNOSIS — R519 Headache, unspecified: Secondary | ICD-10-CM | POA: Insufficient documentation

## 2021-11-22 DIAGNOSIS — Z800 Family history of malignant neoplasm of digestive organs: Secondary | ICD-10-CM | POA: Insufficient documentation

## 2021-11-22 DIAGNOSIS — C9000 Multiple myeloma not having achieved remission: Secondary | ICD-10-CM | POA: Diagnosis present

## 2021-11-22 DIAGNOSIS — C9001 Multiple myeloma in remission: Secondary | ICD-10-CM | POA: Diagnosis not present

## 2021-11-22 DIAGNOSIS — R6883 Chills (without fever): Secondary | ICD-10-CM | POA: Diagnosis not present

## 2021-11-22 DIAGNOSIS — F419 Anxiety disorder, unspecified: Secondary | ICD-10-CM | POA: Insufficient documentation

## 2021-11-22 DIAGNOSIS — R197 Diarrhea, unspecified: Secondary | ICD-10-CM | POA: Insufficient documentation

## 2021-11-22 DIAGNOSIS — Z79899 Other long term (current) drug therapy: Secondary | ICD-10-CM | POA: Diagnosis not present

## 2021-11-22 DIAGNOSIS — Z833 Family history of diabetes mellitus: Secondary | ICD-10-CM | POA: Diagnosis not present

## 2021-11-22 DIAGNOSIS — Z801 Family history of malignant neoplasm of trachea, bronchus and lung: Secondary | ICD-10-CM | POA: Insufficient documentation

## 2021-11-22 DIAGNOSIS — K5900 Constipation, unspecified: Secondary | ICD-10-CM | POA: Insufficient documentation

## 2021-11-22 DIAGNOSIS — Z836 Family history of other diseases of the respiratory system: Secondary | ICD-10-CM | POA: Diagnosis not present

## 2021-11-22 DIAGNOSIS — R109 Unspecified abdominal pain: Secondary | ICD-10-CM | POA: Insufficient documentation

## 2021-11-22 DIAGNOSIS — Z8249 Family history of ischemic heart disease and other diseases of the circulatory system: Secondary | ICD-10-CM | POA: Diagnosis not present

## 2021-11-22 DIAGNOSIS — F32A Depression, unspecified: Secondary | ICD-10-CM | POA: Insufficient documentation

## 2021-11-22 NOTE — Patient Instructions (Addendum)
[place] at [facility] ?Discharge Instructions ? ? ?You were seen and examined today by Dr. Delton Coombes. ? ?He discussed the lab work that [employer] obtained.  We will repeat blood work in 3 weeks and see you back in 4 weeks.  We will also obtain a PET scan prior to your next office visit.  ? ?Return as scheduled.  ? ? ?Thank you for choosing [place] at [facility] to provide your oncology and hematology care.  To afford each patient quality time with our provider, please arrive at least 15 minutes before your scheduled appointment time.  ? ?If you have a lab appointment with the [facility] please come in thru the Main Entrance and check in at the main information desk. ? ?You need to re-schedule your appointment should you arrive 10 or more minutes late.  We strive to give you quality time with our providers, and arriving late affects you and other patients whose appointments are after yours.  Also, if you no show three or more times for appointments you may be dismissed from the clinic at the providers discretion.     ?Again, thank you for choosing [facility].  Our hope is that these requests will decrease the amount of time that you wait before being seen by our physicians.       ?_____________________________________________________________ ? ?Should you have questions after your visit to [facility], please contact our office at [phone] and follow the prompts.  Our office hours are 8:00 a.m. and 4:30 p.m. Monday - Friday.  Please note that voicemails left after 4:00 p.m. may not be returned until the following business day.  We are closed weekends and major holidays.  You do have access to a nurse 24-7, just call the main number to the clinic [phone] and do not press any options, hold on the line and a nurse will answer the phone.   ? ?For prescription refill requests, have your pharmacy contact our office and allow 72  hours.   ? ?Due to Covid, you will need to wear a mask upon entering the hospital. If you do not have a mask, a mask will be given to you at the Main Entrance upon arrival. For doctor visits, patients may have 1 support person age 44 or older with them. For treatment visits, patients can not have anyone with them due to social distancing guidelines and our immunocompromised population.  ? ?   ?

## 2021-11-22 NOTE — Progress Notes (Signed)
Patient is taking Pomalyst as prescribed.  Patient has not missed any doses and reports no side effects at this time.   ?

## 2021-11-22 NOTE — Progress Notes (Signed)
Patient is taking Pomalyst as prescribed.  She has not missed any doses and reports no side effects at this time.   

## 2021-11-22 NOTE — Progress Notes (Signed)
? ?[facility] ?[address] ?[place], [place] [address] ? ? ?CLINIC:  ?Medical Oncology/Hematology ? ?PCP:  ?[name], MD ?[address] [place] [address] ?[phone] ? ? ?REASON FOR VISIT:  ?Follow-up for multiple myeloma ? ?PRIOR THERAPY:  ?1. KPD x 4 cycles from 04/17/2018 to 08/21/2018. ?2. Stem cell transplant on 10/23/2018. ?3. Radiation to left and right humerus 25 Gy in 10 fractions from 08/23/2020 to 08/25/2020. ? ?NGS Results: not done ? ?CURRENT THERAPY: Maintenance Pomalyst 3/4 weeks ? ?BRIEF ONCOLOGIC HISTORY:  ?Oncology History  ?Multiple myeloma without remission ([place])  ?04/15/2018 Initial Diagnosis  ? Multiple myeloma without remission ([place]) ?  ?04/17/2018 - 09/05/2018 Chemotherapy  ? The patient had dexamethasone (DECADRON) 4 MG tablet, 1 of 1 cycle, Start date: 04/15/2018, End date: 05/26/2018 ?palonosetron (ALOXI) injection 0.25 mg, 0.25 mg, Intravenous,  Once, 1 of 1 cycle ?Administration: 0.25 mg (04/17/2018), 0.25 mg (04/24/2018), 0.25 mg (05/01/2018) ?cyclophosphamide (CYTOXAN) 540 mg in sodium chloride 0.9 % 250 mL chemo infusion, 300 mg/m2 = 540 mg, Intravenous,  Once, 1 of 1 cycle ?Administration: 540 mg (04/17/2018), 540 mg (04/24/2018), 540 mg (05/01/2018) ?carfilzomib (KYPROLIS) 36 mg in dextrose 5 % 50 mL chemo infusion, 20 mg/m2 = 36 mg, Intravenous, Once, 5 of 5 cycles ?Administration: 36 mg (04/17/2018), 36 mg (04/18/2018), 60 mg (04/24/2018), 60 mg (04/25/2018), 60 mg (05/01/2018), 60 mg (05/02/2018), 60 mg (05/15/2018), 60 mg (05/16/2018), 60 mg (06/12/2018), 60 mg (06/13/2018), 60 mg (06/26/2018), 60 mg (06/27/2018), 60 mg (07/03/2018), 60 mg (07/04/2018), 60 mg (07/24/2018), 60 mg (07/25/2018), 60 mg (07/31/2018), 60 mg (08/01/2018), 60 mg (08/08/2018), 60 mg (08/07/2018), 60 mg (08/21/2018), 60 mg (08/22/2018), 60 mg (08/28/2018), 60 mg (08/29/2018), 60 mg (09/04/2018), 60 mg (09/05/2018) ? ? for chemotherapy treatment.  ? ?  ? ? ?CANCER STAGING: ? Cancer Staging  ?No matching staging  information was found for the patient. ? ?INTERVAL HISTORY:  ?[name], a 76 y.o. female, returns for routine follow-up of her multiple myeloma. [place] was last seen on 09/26/2021.  ? ?Today she reports feeling well. She reports pain in her right flank, back, left shoulder blade, and abdomen starting 2 months ago which has worsened. She is taking Pomalyst and tolerating it well. She is taking hydrocodone every 6 hours with ibuprofen prn. She reports constipation and diarrhea.  ? ?REVIEW OF SYSTEMS:  ?Review of Systems  ?Constitutional:  Positive for chills. Negative for appetite change and fatigue.  ?Gastrointestinal:  Positive for abdominal pain, constipation, diarrhea and nausea.  ?Musculoskeletal:  Positive for back pain and flank pain (4/10).  ?Neurological:  Positive for headaches.  ?Psychiatric/Behavioral:  Positive for depression. The patient is nervous/anxious.   ?All other systems reviewed and are negative. ? ?PAST MEDICAL/SURGICAL HISTORY:  ?Past Medical History:  ?Diagnosis Date  ? Anxiety   ? Depression   ? Hypertension   ? Multiple myeloma ([place])   ? multiple myeloma  ? ?Past Surgical History:  ?Procedure Laterality Date  ? ABDOMINAL HYSTERECTOMY    ? total  ? APPENDECTOMY    ? LAPAROSCOPIC APPENDECTOMY N/A 05/20/2018  ? Procedure: APPENDECTOMY LAPAROSCOPIC;  Surgeon: [name], MD;  Location: [facility];  Service: General;  Laterality: N/A;  ? PORTACATH PLACEMENT Right 04/14/2018  ? Procedure: INSERTION PORT-A-CATH;  Surgeon: [name], MD;  Location: [facility];  Service: General;  Laterality: Right;  ? ? ?SOCIAL HISTORY:  ?Social History  ? ?Socioeconomic History  ? Marital status: Legally Separated  ?  Spouse name: Not on file  ? Number of children: 6  ? Years of education: Not on file  ? Highest education level: Not on file  ?Occupational History  ?  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 Smoking status: Never  ? Smokeless tobacco: Never  ?Vaping Use  ? Vaping Use: Never used   ?Substance and Sexual Activity  ? Alcohol use: Never  ? Drug use: Not Currently  ? Sexual activity: Not Currently  ?Other Topics Concern  ? Not on file  ?Social History Narrative  ? Not on file  ? ?Social Determinants of Health  ? ?Financial Resource Strain: Not on file  ?Food Insecurity: Not on file  ?Transportation Needs: Not on file  ?Physical Activity: Not on file  ?Stress: Not on file  ?Social Connections: Not on file  ?Intimate Partner Violence: Not on file  ? ? ?FAMILY HISTORY:  ?Family History  ?Problem Relation Age of Onset  ? Heart disease Mother   ? Emphysema Father   ? Diabetes Sister   ? Depression Sister   ? Cancer Brother   ?     liver, lung, and colon  ? Diabetes Brother   ? ? ?CURRENT MEDICATIONS:  ?Current Outpatient Medications  ?Medication Sig Dispense Refill  ? acyclovir (ZOVIRAX) 400 MG tablet Take 1 tablet (400 mg total) by mouth 2 (two) times daily. 60 tablet 4  ? albuterol (VENTOLIN HFA) 108 (90 Base) MCG/ACT inhaler Inhale 2 puffs into the lungs in the morning, at noon, in the evening, and at bedtime. 8 g 0  ? allopurinol (ZYLOPRIM) 300 MG tablet 1 tablet    ? ALPRAZolam (XANAX) 0.5 MG tablet Take one tablet during the day as needed for anxiety and two tablets at bedtime as needed for anxiety/insomnia 90 tablet 3  ? apixaban (ELIQUIS) 2.5 MG TABS tablet 1 tablet    ? aspirin 81 MG chewable tablet 1 tablet    ? Calcium Carb-Cholecalciferol (CALCIUM 1000 + D PO) Take 1,000 mg by mouth daily. Take one tablet daily until next visit with oncologist.    ? citalopram (CELEXA) 40 MG tablet Take 1 tablet by mouth daily.    ? doxepin (SINEQUAN) 10 MG capsule Take 10 mg by mouth at bedtime.    ? ELIQUIS 5 MG TABS tablet Take 5 mg by mouth 2 (two) times daily.    ? escitalopram (LEXAPRO) 20 MG tablet 1 tablet (STOP citalopram)    ? HYDROcodone-acetaminophen (NORCO) 7.5-325 MG tablet Take 1 tablet by mouth every 6 (six) hours as needed for moderate pain. 120 tablet 0  ? Lidocaine-Prilocaine (EMLA EX)  apply to affected area    ? loperamide (IMODIUM) 2 MG capsule Take 1 capsule (2 mg total) by mouth as needed for diarrhea or loose stools. 30 capsule 0  ? pantoprazole (PROTONIX) 40 MG tablet Take 40 mg by mouth daily.    ? pomalidomide (POMALYST) 2 MG capsule Take 1 capsule (2 mg total) by mouth daily. 21 days on, 7 days off 21 capsule 0  ? rosuvastatin (CRESTOR) 10 MG tablet Take 10 mg by mouth daily.    ? terbinafine (LAMISIL) 1 % cream Apply 1 application topically 2 (two) times daily. 30 g 0  ? TERBINAFINE EX 1 application    ? zolpidem (AMBIEN) 10 MG tablet Take 1 tablet (10 mg total) by mouth at bedtime as needed for sleep. 30 tablet 2  ? ?No current facility-administered medications for this visit.  ? ? ?ALLERGIES:  ?  Allergies  ?Allergen Reactions  ? Ciprofloxacin Anaphylaxis  ? Amoxicillin   ? Morphine   ? Morphine And Related Nausea And Vomiting  ? Augmentin [Amoxicillin-Pot Clavulanate] Other (See Comments)  ?  Headache, insomnia  ? ? ?PHYSICAL EXAM:  ?Performance status (ECOG): 1 - Symptomatic but completely ambulatory ? ?Vitals:  ? 11/22/21 0926  ?BP: 140/73  ?Pulse: (!) 53  ?Resp: 18  ?Temp: (!) 96.7 ?F (35.9 ?C)  ?SpO2: 99%  ? ?Wt Readings from Last 3 Encounters:  ?11/22/21 174 lb 1.6 oz (79 kg)  ?09/26/21 174 lb 14.4 oz (79.3 kg)  ?08/01/21 169 lb 4.8 oz (76.8 kg)  ? ?Physical Exam ?Vitals reviewed.  ?Constitutional:   ?   Appearance: Normal appearance.  ?Cardiovascular:  ?   Rate and Rhythm: Normal rate and regular rhythm.  ?   Pulses: Normal pulses.  ?   Heart sounds: Normal heart sounds.  ?Pulmonary:  ?   Effort: Pulmonary effort is normal.  ?   Breath sounds: Normal breath sounds.  ?Musculoskeletal:  ?   Comments: R posterior rib and L posterior scapula tenderness  ?Neurological:  ?   General: No focal deficit present.  ?   Mental Status: She is alert and oriented to person, place, and time.  ?Psychiatric:     ?   Mood and Affect: Mood normal.     ?   Behavior: Behavior normal.  ?  ? ?LABORATORY  DATA:  ?I have reviewed the labs as listed.  ? ?  Latest Ref Rng & Units 09/19/2021  ? 10:18 AM 07/25/2021  ?  3:38 PM 06/07/2021  ?  1:30 PM  ?CBC  ?WBC 4.0 - 10.5 K/uL 2.4   2.2   3.0    ?Hemoglobin 12.0 -

## 2021-11-23 ENCOUNTER — Other Ambulatory Visit (HOSPITAL_COMMUNITY): Payer: Self-pay

## 2021-11-23 DIAGNOSIS — F419 Anxiety disorder, unspecified: Secondary | ICD-10-CM

## 2021-11-23 DIAGNOSIS — C9000 Multiple myeloma not having achieved remission: Secondary | ICD-10-CM

## 2021-11-23 MED ORDER — ALPRAZOLAM 0.5 MG PO TABS: ORAL_TABLET | ORAL | 3 refills | Status: DC

## 2021-12-11 ENCOUNTER — Other Ambulatory Visit (HOSPITAL_COMMUNITY): Payer: Self-pay | Admitting: Hematology

## 2021-12-11 DIAGNOSIS — C9000 Multiple myeloma not having achieved remission: Secondary | ICD-10-CM

## 2021-12-12 ENCOUNTER — Other Ambulatory Visit (HOSPITAL_COMMUNITY): Payer: Self-pay

## 2021-12-12 DIAGNOSIS — C9000 Multiple myeloma not having achieved remission: Secondary | ICD-10-CM

## 2021-12-12 MED ORDER — POMALIDOMIDE 2 MG PO CAPS: 2.0000 mg | ORAL_CAPSULE | Freq: Every day | ORAL | 0 refills | Status: DC

## 2021-12-12 NOTE — Telephone Encounter (Signed)
Chart reviewed. Pomalyst refilled per last office note with Dr. Katragadda.  

## 2021-12-13 ENCOUNTER — Telehealth: Payer: Self-pay | Admitting: *Deleted

## 2021-12-13 ENCOUNTER — Ambulatory Visit (INDEPENDENT_AMBULATORY_CARE_PROVIDER_SITE_OTHER): Payer: Medicare Other | Admitting: Internal Medicine

## 2021-12-13 ENCOUNTER — Encounter: Payer: Self-pay | Admitting: *Deleted

## 2021-12-13 ENCOUNTER — Encounter: Payer: Self-pay | Admitting: Internal Medicine

## 2021-12-13 VITALS — BP 124/58 | HR 68 | Temp 97.1°F | Ht 64.0 in | Wt 181.4 lb

## 2021-12-13 DIAGNOSIS — K5900 Constipation, unspecified: Secondary | ICD-10-CM | POA: Diagnosis not present

## 2021-12-13 DIAGNOSIS — R109 Unspecified abdominal pain: Secondary | ICD-10-CM | POA: Diagnosis not present

## 2021-12-13 DIAGNOSIS — Z79899 Other long term (current) drug therapy: Secondary | ICD-10-CM | POA: Diagnosis not present

## 2021-12-13 DIAGNOSIS — Z800 Family history of malignant neoplasm of digestive organs: Secondary | ICD-10-CM | POA: Diagnosis not present

## 2021-12-13 MED ORDER — PEG 3350-KCL-NA BICARB-NACL 420 G PO SOLR: ORAL | 0 refills | Status: DC

## 2021-12-13 NOTE — Patient Instructions (Signed)
I am unsure what is causing your right-sided pain.  Likely musculoskeletal given that it worsens with activity.  From a GI standpoint, could be related to your gallbladder. ? ?I will wait and see what the PET scan shows and then we can decide if we need to further evaluate with right upper quadrant ultrasound. ? ?For your constipation, I want you to start taking MiraLAX 1 capful every morning.  If this is too much then you can decrease to once every 2 to 3 days.  If this is not enough then you can increase to 2 capfuls a day. ? ?We will schedule you for colonoscopy for colon cancer screening purposes.  You will need to hold your Eliquis x48 hours prior to procedure. ? ?Further recommendations to follow. ? ?It was very nice meeting you today. ? ?Dr. Abbey Chatters ? ?At [facility] we value your feedback. You may receive a survey about your visit today. Please share your experience as we strive to create trusting relationships with our patients to provide genuine, compassionate, quality care. ? ?We appreciate your understanding and patience as we review any laboratory studies, imaging, and other diagnostic tests that are ordered as we care for you. Our office policy is 5 business days for review of these results, and any emergent or urgent results are addressed in a timely manner for your best interest. If you do not hear from our office in 1 week, please contact us.  ? ?We also encourage the use of MyChart, which contains your medical information for your review as well. If you are not enrolled in this feature, an access code is on this after visit summary for your convenience. Thank you for allowing [place] to be involved in your care. ? ?It was great to see you today!  I hope you have a great rest of your Spring! ? ? ? ?Alexandra Price. Abbey Chatters, D.O. ?Gastroenterology and Hepatology ?[facility] ? ?

## 2021-12-13 NOTE — H&P (View-Only) (Signed)
? ? ?Primary Care Physician:  [name], MD ?Primary Gastroenterologist:  Dr. Abbey Chatters ? ?Chief Complaint  ?Patient presents with  ? New Patient (Initial Visit)  ?  Right side pain x several months  ? ? ?HPI:   ?Alexandra Price is a 76 y.o. female who presents to clinic today by referral from her PCP Dr. Yong Channel for evaluation.  She states for the last 3 months she has had worsening right upper quadrant abdominal pain.  Constant, 3-4 out of 10, does worsen at times especially with activity throughout the day.  Gallbladder in situ.  No change in bowel movements related to this.  Eating does not make it worse. ? ?Does note issues with constipation.  Has a bowel movement once every 2 to 3 days.  States she takes MiraLAX once a week when she feels very bloated and backed up.  No melena or hematochezia.  Last colonoscopy over 10 years ago.  Does state her brother died from metastatic colon cancer in his 71s. ? ?Chronically takes Eliquis for history of PE September 2021.  Repeat CTA February 2022 without evidence of PE.  Lower extremity ultrasound without evidence of DVT at that time as well. ? ?Past Medical History:  ?Diagnosis Date  ? Anxiety   ? Depression   ? Hypertension   ? Multiple myeloma ([place])   ? multiple myeloma  ? ? ?Past Surgical History:  ?Procedure Laterality Date  ? ABDOMINAL HYSTERECTOMY    ? total  ? APPENDECTOMY    ? LAPAROSCOPIC APPENDECTOMY N/A 05/20/2018  ? Procedure: APPENDECTOMY LAPAROSCOPIC;  Surgeon: [name], MD;  Location: [facility];  Service: General;  Laterality: N/A;  ? PORTACATH PLACEMENT Right 04/14/2018  ? Procedure: INSERTION PORT-A-CATH;  Surgeon: [name], MD;  Location: [facility];  Service: General;  Laterality: Right;  ? ? ?Current Outpatient Medications  ?Medication Sig Dispense Refill  ? acyclovir (ZOVIRAX) 400 MG tablet Take 1 tablet (400 mg total) by mouth 2 (two) times daily. 60 tablet 4  ? ALPRAZolam (XANAX) 0.5 MG tablet Take one tablet during the day as needed for  anxiety and two tablets at bedtime as needed for anxiety/insomnia 90 tablet 3  ? apixaban (ELIQUIS) 2.5 MG TABS tablet 1 tablet    ? Calcium Carb-Cholecalciferol (CALCIUM 1000 + D PO) Take 1,000 mg by mouth daily. Take one tablet daily until next visit with oncologist.    ? citalopram (CELEXA) 40 MG tablet Take 1 tablet by mouth daily.    ? ELIQUIS 5 MG TABS tablet Take 5 mg by mouth 2 (two) times daily.    ? HYDROcodone-acetaminophen (NORCO) 7.5-325 MG tablet Take 1 tablet by mouth every 6 (six) hours as needed for moderate pain. 120 tablet 0  ? loperamide (IMODIUM) 2 MG capsule Take 1 capsule (2 mg total) by mouth as needed for diarrhea or loose stools. 30 capsule 0  ? pantoprazole (PROTONIX) 40 MG tablet Take 40 mg by mouth daily.    ? pomalidomide (POMALYST) 2 MG capsule Take 1 capsule (2 mg total) by mouth daily. 21 days on, 7 days off 21 capsule 0  ? zolpidem (AMBIEN) 10 MG tablet Take 1 tablet (10 mg total) by mouth at bedtime as needed for sleep. 30 tablet 2  ? albuterol (VENTOLIN HFA) 108 (90 Base) MCG/ACT inhaler Inhale 2 puffs into the lungs in the morning, at noon, in the evening, and at bedtime. (Patient not taking: Reported on 12/13/2021) 8 g 0  ? allopurinol (ZYLOPRIM) 300  MG tablet 1 tablet (Patient not taking: Reported on 12/13/2021)    ? aspirin 81 MG chewable tablet 1 tablet (Patient not taking: Reported on 12/13/2021)    ? doxepin (SINEQUAN) 10 MG capsule Take 10 mg by mouth at bedtime. (Patient not taking: Reported on 12/13/2021)    ? escitalopram (LEXAPRO) 20 MG tablet 1 tablet (STOP citalopram) (Patient not taking: Reported on 12/13/2021)    ? Lidocaine-Prilocaine (EMLA EX) apply to affected area (Patient not taking: Reported on 12/13/2021)    ? rosuvastatin (CRESTOR) 10 MG tablet Take 10 mg by mouth daily.    ? terbinafine (LAMISIL) 1 % cream Apply 1 application topically 2 (two) times daily. (Patient not taking: Reported on 12/13/2021) 30 g 0  ? TERBINAFINE EX 1 application (Patient not taking:  Reported on 12/13/2021)    ? ?No current facility-administered medications for this visit.  ? ? ?Allergies as of 12/13/2021 - Review Complete 12/13/2021  ?Allergen Reaction Noted  ? Ciprofloxacin Anaphylaxis 06/04/2018  ? Amoxicillin  04/07/2019  ? Morphine  05/03/2020  ? Morphine and related Nausea And Vomiting 04/10/2018  ? Augmentin [amoxicillin-pot clavulanate] Other (See Comments)   ? ? ?Family History  ?Problem Relation Age of Onset  ? Heart disease Mother   ? Emphysema Father   ? Diabetes Sister   ? Depression Sister   ? Cancer Brother   ?     liver, lung, and colon  ? Diabetes Brother   ? ? ?Social History  ? ?Socioeconomic History  ? Marital status: Legally Separated  ?  Spouse name: Not on file  ? Number of children: 6  ? Years of education: Not on file  ? Highest education level: Not on file  ?Occupational History  ?  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 Smoking status: Never  ? Smokeless tobacco: Never  ?Vaping Use  ? Vaping Use: Never used  ?Substance and Sexual Activity  ? Alcohol use: Never  ? Drug use: Not Currently  ? Sexual activity: Not Currently  ?Other Topics Concern  ? Not on file  ?Social History Narrative  ? Not on file  ? ?Social Determinants of Health  ? ?Financial Resource Strain: Not on file  ?Food Insecurity: Not on file  ?Transportation Needs: Not on file  ?Physical Activity: Not on file  ?Stress: Not on file  ?Social Connections: Not on file  ?Intimate Partner Violence: Not on file  ? ? ?Subjective: ?Review of Systems  ?Constitutional:  Negative for chills and fever.  ?HENT:  Negative for congestion and hearing loss.   ?Eyes:  Negative for blurred vision and double vision.  ?Respiratory:  Negative for cough and shortness of breath.   ?Cardiovascular:  Negative for chest pain and palpitations.  ?Gastrointestinal:  Positive for abdominal pain and constipation. Negative for blood in stool, diarrhea, heartburn, melena and vomiting.  ?Genitourinary:  Negative for dysuria and  urgency.  ?Musculoskeletal:  Negative for joint pain and myalgias.  ?Skin:  Negative for itching and rash.  ?Neurological:  Negative for dizziness and headaches.  ?Psychiatric/Behavioral:  Negative for depression. The patient is not nervous/anxious.    ? ? ? ?Objective: ?BP (!) 124/58   Pulse 68   Temp (!) 97.1 ?F (36.2 ?C)   Ht _0  (1.626 m)   Wt 181 lb 6.4 oz (82.3 kg)   BMI 31.14 kg/m?  ?Physical Exam ?Constitutional:   ?   Appearance: Normal appearance.  ?HENT:  ?   Head: Normocephalic and atraumatic.  ?  Eyes:  ?   Extraocular Movements: Extraocular movements intact.  ?   Conjunctiva/sclera: Conjunctivae normal.  ?Cardiovascular:  ?   Rate and Rhythm: Normal rate and regular rhythm.  ?Pulmonary:  ?   Effort: Pulmonary effort is normal.  ?   Breath sounds: Normal breath sounds.  ?Abdominal:  ?   General: Bowel sounds are normal.  ?   Palpations: Abdomen is soft.  ?   Tenderness: There is abdominal tenderness.  ?Musculoskeletal:     ?   General: No swelling. Normal range of motion.  ?   Cervical back: Normal range of motion and neck supple.  ?Skin: ?   General: Skin is warm and dry.  ?   Coloration: Skin is not jaundiced.  ?Neurological:  ?   General: No focal deficit present.  ?   Mental Status: She is alert and oriented to person, place, and time.  ?Psychiatric:     ?   Mood and Affect: Mood normal.     ?   Behavior: Behavior normal.  ? ? ? ?Assessment: ?*Right upper quadrant pain ?*Constipation-mild ?*Colon cancer screening ?*High risk medication use ? ?Plan: ?Etiology of patient's right upper quadrant pain unclear.  Possibly musculoskeletal as she does note it worsens with activity.  She is slightly tender in her right upper quadrant on physical exam.  Will await PET scan tomorrow to see if she has any new findings in the area from a multiple myeloma standpoint. ? ?If unremarkable, will order right upper quadrant ultrasound to evaluate for biliary colic. ? ?In regards to her constipation, I recommend she  start taking MiraLAX 1 capful daily.  If this is not adequate she can increase to 2 capfuls daily.  If this is too much she can go to 1 capful every 2 to 3 days.  States she eats plenty of fiber in her diet. ? ?Will

## 2021-12-13 NOTE — Progress Notes (Signed)
? ? ?Primary Care Physician:  Hunter, Denise, MD ?Primary Gastroenterologist:  Dr. Marvalene Barrett ? ?Chief Complaint  ?Patient presents with  ? New Patient (Initial Visit)  ?  Right side pain x several months  ? ? ?HPI:   ?Alexandra Price is a 75 y.o. female who presents to clinic today by referral from her PCP Dr. Hunter for evaluation.  She states for the last 3 months she has had worsening right upper quadrant abdominal pain.  Constant, 3-4 out of 10, does worsen at times especially with activity throughout the day.  Gallbladder in situ.  No change in bowel movements related to this.  Eating does not make it worse. ? ?Does note issues with constipation.  Has a bowel movement once every 2 to 3 days.  States she takes MiraLAX once a week when she feels very bloated and backed up.  No melena or hematochezia.  Last colonoscopy over 10 years ago.  Does state her brother died from metastatic colon cancer in his 70s. ? ?Chronically takes Eliquis for history of PE September 2021.  Repeat CTA February 2022 without evidence of PE.  Lower extremity ultrasound without evidence of DVT at that time as well. ? ?Past Medical History:  ?Diagnosis Date  ? Anxiety   ? Depression   ? Hypertension   ? Multiple myeloma ([facility])   ? multiple myeloma  ? ? ?Past Surgical History:  ?Procedure Laterality Date  ? ABDOMINAL HYSTERECTOMY    ? total  ? APPENDECTOMY    ? LAPAROSCOPIC APPENDECTOMY N/A 05/20/2018  ? Procedure: APPENDECTOMY LAPAROSCOPIC;  Surgeon: Jenkins, Mark, MD;  Location: [facility];  Service: General;  Laterality: N/A;  ? PORTACATH PLACEMENT Right 04/14/2018  ? Procedure: INSERTION PORT-A-CATH;  Surgeon: Jenkins, Mark, MD;  Location: [facility];  Service: General;  Laterality: Right;  ? ? ?Current Outpatient Medications  ?Medication Sig Dispense Refill  ? acyclovir (ZOVIRAX) 400 MG tablet Take 1 tablet (400 mg total) by mouth 2 (two) times daily. 60 tablet 4  ? ALPRAZolam (XANAX) 0.5 MG tablet Take one tablet during the day as needed for  anxiety and two tablets at bedtime as needed for anxiety/insomnia 90 tablet 3  ? apixaban (ELIQUIS) 2.5 MG TABS tablet 1 tablet    ? Calcium Carb-Cholecalciferol (CALCIUM 1000 + D PO) Take 1,000 mg by mouth daily. Take one tablet daily until next visit with oncologist.    ? citalopram (CELEXA) 40 MG tablet Take 1 tablet by mouth daily.    ? ELIQUIS 5 MG TABS tablet Take 5 mg by mouth 2 (two) times daily.    ? HYDROcodone-acetaminophen (NORCO) 7.5-325 MG tablet Take 1 tablet by mouth every 6 (six) hours as needed for moderate pain. 120 tablet 0  ? loperamide (IMODIUM) 2 MG capsule Take 1 capsule (2 mg total) by mouth as needed for diarrhea or loose stools. 30 capsule 0  ? pantoprazole (PROTONIX) 40 MG tablet Take 40 mg by mouth daily.    ? pomalidomide (POMALYST) 2 MG capsule Take 1 capsule (2 mg total) by mouth daily. 21 days on, 7 days off 21 capsule 0  ? zolpidem (AMBIEN) 10 MG tablet Take 1 tablet (10 mg total) by mouth at bedtime as needed for sleep. 30 tablet 2  ? albuterol (VENTOLIN HFA) 108 (90 Base) MCG/ACT inhaler Inhale 2 puffs into the lungs in the morning, at noon, in the evening, and at bedtime. (Patient not taking: Reported on 12/13/2021) 8 g 0  ? allopurinol (ZYLOPRIM) 300   MG tablet 1 tablet (Patient not taking: Reported on 12/13/2021)    ? aspirin 81 MG chewable tablet 1 tablet (Patient not taking: Reported on 12/13/2021)    ? doxepin (SINEQUAN) 10 MG capsule Take 10 mg by mouth at bedtime. (Patient not taking: Reported on 12/13/2021)    ? escitalopram (LEXAPRO) 20 MG tablet 1 tablet (STOP citalopram) (Patient not taking: Reported on 12/13/2021)    ? Lidocaine-Prilocaine (EMLA EX) apply to affected area (Patient not taking: Reported on 12/13/2021)    ? rosuvastatin (CRESTOR) 10 MG tablet Take 10 mg by mouth daily.    ? terbinafine (LAMISIL) 1 % cream Apply 1 application topically 2 (two) times daily. (Patient not taking: Reported on 12/13/2021) 30 g 0  ? TERBINAFINE EX 1 application (Patient not taking:  Reported on 12/13/2021)    ? ?No current facility-administered medications for this visit.  ? ? ?Allergies as of 12/13/2021 - Review Complete 12/13/2021  ?Allergen Reaction Noted  ? Ciprofloxacin Anaphylaxis 06/04/2018  ? Amoxicillin  04/07/2019  ? Morphine  05/03/2020  ? Morphine and related Nausea And Vomiting 04/10/2018  ? Augmentin [amoxicillin-pot clavulanate] Other (See Comments)   ? ? ?Family History  ?Problem Relation Age of Onset  ? Heart disease Mother   ? Emphysema Father   ? Diabetes Sister   ? Depression Sister   ? Cancer Brother   ?     liver, lung, and colon  ? Diabetes Brother   ? ? ?Social History  ? ?Socioeconomic History  ? Marital status: Legally Separated  ?  Spouse name: Not on file  ? Number of children: 6  ? Years of education: Not on file  ? Highest education level: Not on file  ?Occupational History  ?  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 Smoking status: Never  ? Smokeless tobacco: Never  ?Vaping Use  ? Vaping Use: Never used  ?Substance and Sexual Activity  ? Alcohol use: Never  ? Drug use: Not Currently  ? Sexual activity: Not Currently  ?Other Topics Concern  ? Not on file  ?Social History Narrative  ? Not on file  ? ?Social Determinants of Health  ? ?Financial Resource Strain: Not on file  ?Food Insecurity: Not on file  ?Transportation Needs: Not on file  ?Physical Activity: Not on file  ?Stress: Not on file  ?Social Connections: Not on file  ?Intimate Partner Violence: Not on file  ? ? ?Subjective: ?Review of Systems  ?Constitutional:  Negative for chills and fever.  ?HENT:  Negative for congestion and hearing loss.   ?Eyes:  Negative for blurred vision and double vision.  ?Respiratory:  Negative for cough and shortness of breath.   ?Cardiovascular:  Negative for chest pain and palpitations.  ?Gastrointestinal:  Positive for abdominal pain and constipation. Negative for blood in stool, diarrhea, heartburn, melena and vomiting.  ?Genitourinary:  Negative for dysuria and  urgency.  ?Musculoskeletal:  Negative for joint pain and myalgias.  ?Skin:  Negative for itching and rash.  ?Neurological:  Negative for dizziness and headaches.  ?Psychiatric/Behavioral:  Negative for depression. The patient is not nervous/anxious.    ? ? ? ?Objective: ?BP (!) 124/58   Pulse 68   Temp (!) 97.1 ?F (36.2 ?C)   Ht 5' 4" (1.626 m)   Wt 181 lb 6.4 oz (82.3 kg)   BMI 31.14 kg/m?  ?Physical Exam ?Constitutional:   ?   Appearance: Normal appearance.  ?HENT:  ?   Head: Normocephalic and atraumatic.  ?  Eyes:  ?   Extraocular Movements: Extraocular movements intact.  ?   Conjunctiva/sclera: Conjunctivae normal.  ?Cardiovascular:  ?   Rate and Rhythm: Normal rate and regular rhythm.  ?Pulmonary:  ?   Effort: Pulmonary effort is normal.  ?   Breath sounds: Normal breath sounds.  ?Abdominal:  ?   General: Bowel sounds are normal.  ?   Palpations: Abdomen is soft.  ?   Tenderness: There is abdominal tenderness.  ?Musculoskeletal:     ?   General: No swelling. Normal range of motion.  ?   Cervical back: Normal range of motion and neck supple.  ?Skin: ?   General: Skin is warm and dry.  ?   Coloration: Skin is not jaundiced.  ?Neurological:  ?   General: No focal deficit present.  ?   Mental Status: She is alert and oriented to person, place, and time.  ?Psychiatric:     ?   Mood and Affect: Mood normal.     ?   Behavior: Behavior normal.  ? ? ? ?Assessment: ?*Right upper quadrant pain ?*Constipation-mild ?*Colon cancer screening ?*High risk medication use ? ?Plan: ?Etiology of patient's right upper quadrant pain unclear.  Possibly musculoskeletal as she does note it worsens with activity.  She is slightly tender in her right upper quadrant on physical exam.  Will await PET scan tomorrow to see if she has any new findings in the area from a multiple myeloma standpoint. ? ?If unremarkable, will order right upper quadrant ultrasound to evaluate for biliary colic. ? ?In regards to her constipation, I recommend she  start taking MiraLAX 1 capful daily.  If this is not adequate she can increase to 2 capfuls daily.  If this is too much she can go to 1 capful every 2 to 3 days.  States she eats plenty of fiber in her diet. ? ?Will

## 2021-12-13 NOTE — Telephone Encounter (Signed)
Called pt, left detailed VM with pre-op appt details ?

## 2021-12-14 ENCOUNTER — Other Ambulatory Visit (HOSPITAL_COMMUNITY): Payer: Medicare Other

## 2021-12-14 ENCOUNTER — Inpatient Hospital Stay (HOSPITAL_COMMUNITY): Payer: Medicare Other

## 2021-12-14 ENCOUNTER — Ambulatory Visit (HOSPITAL_COMMUNITY)
Admission: RE | Admit: 2021-12-14 | Discharge: 2021-12-14 | Disposition: A | Discharge status: Home / Self Care | Payer: Medicare Other | Source: Ambulatory Visit | Attending: Hematology | Admitting: Hematology

## 2021-12-14 DIAGNOSIS — C9001 Multiple myeloma in remission: Secondary | ICD-10-CM

## 2021-12-14 DIAGNOSIS — C9000 Multiple myeloma not having achieved remission: Secondary | ICD-10-CM | POA: Diagnosis not present

## 2021-12-14 LAB — COMPREHENSIVE METABOLIC PANEL
ALT: 19 U/L (ref 0–44)
AST: 19 U/L (ref 15–41)
Albumin: 3.8 g/dL (ref 3.5–5.0)
Alkaline Phosphatase: 61 U/L (ref 38–126)
Anion gap: 4 — ABNORMAL LOW (ref 5–15)
BUN: 30 mg/dL — ABNORMAL HIGH (ref 8–23)
CO2: 29 mmol/L (ref 22–32)
Calcium: 8.6 mg/dL — ABNORMAL LOW (ref 8.9–10.3)
Chloride: 107 mmol/L (ref 98–111)
Creatinine, Ser: 1.39 mg/dL — ABNORMAL HIGH (ref 0.44–1.00)
GFR, Estimated: 40 mL/min — ABNORMAL LOW (ref >60)
Glucose, Bld: 120 mg/dL — ABNORMAL HIGH (ref 70–99)
Potassium: 4.4 mmol/L (ref 3.5–5.1)
Sodium: 140 mmol/L (ref 135–145)
Total Bilirubin: 0.5 mg/dL (ref 0.3–1.2)
Total Protein: 6.6 g/dL (ref 6.5–8.1)

## 2021-12-14 LAB — CBC WITH DIFFERENTIAL/PLATELET
Abs Immature Granulocytes: 0.01 10*3/uL (ref 0.00–0.07)
Basophils Absolute: 0 10*3/uL (ref 0.0–0.1)
Basophils Relative: 2 %
Eosinophils Absolute: 0.3 10*3/uL (ref 0.0–0.5)
Eosinophils Relative: 12 %
HCT: 29.8 % — ABNORMAL LOW (ref 36.0–46.0)
Hemoglobin: 9.9 g/dL — ABNORMAL LOW (ref 12.0–15.0)
Immature Granulocytes: 0 %
Lymphocytes Relative: 35 %
Lymphs Abs: 0.8 10*3/uL (ref 0.7–4.0)
MCH: 33.9 pg (ref 26.0–34.0)
MCHC: 33.2 g/dL (ref 30.0–36.0)
MCV: 102.1 fL — ABNORMAL HIGH (ref 80.0–100.0)
Monocytes Absolute: 0.4 10*3/uL (ref 0.1–1.0)
Monocytes Relative: 18 %
Neutro Abs: 0.8 10*3/uL — ABNORMAL LOW (ref 1.7–7.7)
Neutrophils Relative %: 33 %
Platelets: 98 10*3/uL — ABNORMAL LOW (ref 150–400)
RBC: 2.92 MIL/uL — ABNORMAL LOW (ref 3.87–5.11)
RDW: 13.1 % (ref 11.5–15.5)
WBC: 2.4 10*3/uL — ABNORMAL LOW (ref 4.0–10.5)
nRBC: 0 % (ref 0.0–0.2)

## 2021-12-14 LAB — IRON AND TIBC
Iron: 89 ug/dL (ref 28–170)
Saturation Ratios: 32 % — ABNORMAL HIGH (ref 10.4–31.8)
TIBC: 278 ug/dL (ref 250–450)
UIBC: 189 ug/dL

## 2021-12-14 LAB — LACTATE DEHYDROGENASE: LDH: 129 U/L (ref 98–192)

## 2021-12-14 LAB — FERRITIN: Ferritin: 386 ng/mL — ABNORMAL HIGH (ref 11–307)

## 2021-12-14 MED ORDER — HEPARIN SOD (PORK) LOCK FLUSH 100 UNIT/ML IV SOLN
500.0000 [IU] | Freq: Once | INTRAVENOUS | Status: AC
  Administered 2021-12-14: 500 [IU] via INTRAVENOUS

## 2021-12-14 MED ORDER — FLUDEOXYGLUCOSE F - 18 (FDG) INJECTION
9.4800 | Freq: Once | INTRAVENOUS | Status: AC | PRN
  Administered 2021-12-14: 9.48 via INTRAVENOUS

## 2021-12-14 MED ORDER — SODIUM CHLORIDE 0.9% FLUSH
10.0000 mL | Freq: Once | INTRAVENOUS | Status: AC
  Administered 2021-12-14: 10 mL via INTRAVENOUS

## 2021-12-14 NOTE — Progress Notes (Signed)
Patients port flushed without difficulty.  Good blood return noted with no bruising or swelling noted at site.  Stable during access and blood draw.  Band aid applied.  VSS with discharge and left in satisfactory condition with no s/s of distress noted.   ?

## 2021-12-14 NOTE — Patient Instructions (Signed)
[facility]  Discharge Instructions: Thank you for choosing [facility] to provide your oncology and hematology care.  If you have a lab appointment with the Cancer Center, please come in thru the Main Entrance and check in at the main information desk.  Wear comfortable clothing and clothing appropriate for easy access to any Portacath or PICC line.   We strive to give you quality time with your provider. You may need to reschedule your appointment if you arrive late (15 or more minutes).  Arriving late affects you and other patients whose appointments are after yours.  Also, if you miss three or more appointments without notifying the office, you may be dismissed from the clinic at the provider's discretion.      For prescription refill requests, have your pharmacy contact our office and allow 72 hours for refills to be completed.    Today you received the following chemotherapy and/or immunotherapy agents PORT flush labs      To help prevent nausea and vomiting after your treatment, we encourage you to take your nausea medication as directed.  BELOW ARE SYMPTOMS THAT SHOULD BE REPORTED IMMEDIATELY: *FEVER GREATER THAN 100.4 F (38 C) OR HIGHER *CHILLS OR SWEATING *NAUSEA AND VOMITING THAT IS NOT CONTROLLED WITH YOUR NAUSEA MEDICATION *UNUSUAL SHORTNESS OF BREATH *UNUSUAL BRUISING OR BLEEDING *URINARY PROBLEMS (pain or burning when urinating, or frequent urination) *BOWEL PROBLEMS (unusual diarrhea, constipation, pain near the anus) TENDERNESS IN MOUTH AND THROAT WITH OR WITHOUT PRESENCE OF ULCERS (sore throat, sores in mouth, or a toothache) UNUSUAL RASH, SWELLING OR PAIN  UNUSUAL VAGINAL DISCHARGE OR ITCHING   Items with * indicate a potential emergency and should be followed up as soon as possible or go to the Emergency Department if any problems should occur.  Please show the CHEMOTHERAPY ALERT CARD or IMMUNOTHERAPY ALERT CARD at check-in to the Emergency  Department and triage nurse.  Should you have questions after your visit or need to cancel or reschedule your appointment, please contact [facility] [phone]  and follow the prompts.  Office hours are 8:00 a.m. to 4:30 p.m. Monday - Friday. Please note that voicemails left after 4:00 p.m. may not be returned until the following business day.  We are closed weekends and major holidays. You have access to a nurse at all times for urgent questions. Please call the main number to the clinic [phone] and follow the prompts.  For any non-urgent questions, you may also contact your provider using MyChart. We now offer e-Visits for anyone 18 and older to request care online for non-urgent symptoms. For details visit [email or web address].   Also download the MyChart app! Go to the app store, search "MyChart", open the app, select [facility], and log in with your MyChart username and password.  Due to Covid, a mask is required upon entering the hospital/clinic. If you do not have a mask, one will be given to you upon arrival. For doctor visits, patients may have 1 support person aged 18 or older with them. For treatment visits, patients cannot have anyone with them due to current Covid guidelines and our immunocompromised population.  

## 2021-12-15 ENCOUNTER — Other Ambulatory Visit (HOSPITAL_COMMUNITY): Payer: Self-pay

## 2021-12-15 DIAGNOSIS — C9000 Multiple myeloma not having achieved remission: Secondary | ICD-10-CM

## 2021-12-15 LAB — KAPPA/LAMBDA LIGHT CHAINS
Kappa free light chain: 41.2 mg/L — ABNORMAL HIGH (ref 3.3–19.4)
Kappa, lambda light chain ratio: 0.84 (ref 0.26–1.65)
Lambda free light chains: 49.2 mg/L — ABNORMAL HIGH (ref 5.7–26.3)

## 2021-12-15 MED ORDER — ACYCLOVIR 400 MG PO TABS: 400.0000 mg | ORAL_TABLET | Freq: Two times a day (BID) | ORAL | 4 refills | Status: DC

## 2021-12-18 LAB — IMMUNOFIXATION ELECTROPHORESIS
IgA: 84 mg/dL (ref 64–422)
IgG (Immunoglobin G), Serum: 909 mg/dL (ref 586–1602)
IgM (Immunoglobulin M), Srm: 25 mg/dL — ABNORMAL LOW (ref 26–217)
Total Protein ELP: 6.2 g/dL (ref 6.0–8.5)

## 2021-12-18 LAB — UIFE/LIGHT CHAINS/TP QN, 24-HR UR
FR KAPPA LT CH,24HR: 136.84 mg/24 hr
FR LAMBDA LT CH,24HR: 22.2 mg/24 hr
Free Kappa Lt Chains,Ur: 171.05 mg/L — ABNORMAL HIGH (ref 1.17–86.46)
Free Kappa/Lambda Ratio: 6.16 (ref 1.83–14.26)
Free Lambda Lt Chains,Ur: 27.75 mg/L — ABNORMAL HIGH (ref 0.27–15.21)
Total Protein, Urine-Ur/day: 114 mg/24 hr (ref 30–150)
Total Protein, Urine: 14.3 mg/dL
Total Volume: 800

## 2021-12-20 ENCOUNTER — Inpatient Hospital Stay (HOSPITAL_COMMUNITY): Payer: Medicare Other | Attending: Hematology | Admitting: Hematology

## 2021-12-20 VITALS — BP 138/80 | HR 59 | Temp 97.9°F | Resp 18 | Ht 64.0 in | Wt 179.2 lb

## 2021-12-20 DIAGNOSIS — C9000 Multiple myeloma not having achieved remission: Secondary | ICD-10-CM | POA: Insufficient documentation

## 2021-12-20 DIAGNOSIS — C9001 Multiple myeloma in remission: Secondary | ICD-10-CM

## 2021-12-20 DIAGNOSIS — D631 Anemia in chronic kidney disease: Secondary | ICD-10-CM | POA: Diagnosis not present

## 2021-12-20 DIAGNOSIS — N189 Chronic kidney disease, unspecified: Secondary | ICD-10-CM | POA: Insufficient documentation

## 2021-12-20 DIAGNOSIS — Z86711 Personal history of pulmonary embolism: Secondary | ICD-10-CM | POA: Insufficient documentation

## 2021-12-20 DIAGNOSIS — Z7901 Long term (current) use of anticoagulants: Secondary | ICD-10-CM | POA: Insufficient documentation

## 2021-12-20 LAB — PROTEIN ELECTROPHORESIS, SERUM
A/G Ratio: 1.5 (ref 0.7–1.7)
Albumin ELP: 3.7 g/dL (ref 2.9–4.4)
Alpha-1-Globulin: 0.2 g/dL (ref 0.0–0.4)
Alpha-2-Globulin: 0.7 g/dL (ref 0.4–1.0)
Beta Globulin: 0.8 g/dL (ref 0.7–1.3)
Gamma Globulin: 0.8 g/dL (ref 0.4–1.8)
Globulin, Total: 2.5 g/dL (ref 2.2–3.9)
Total Protein ELP: 6.2 g/dL (ref 6.0–8.5)

## 2021-12-20 NOTE — Patient Instructions (Addendum)
[place] [facility] ?Discharge Instructions ? ?You were seen and examined today by Dr. Delton Coombes. ? ?Dr. Delton Coombes discussed your most recent lab work and everything looks good. Continue taking Pomalyst as prescribed. ? ?Follow-up as scheduled in 8 weeks. ? ? ? ?Thank you for choosing [place] [facility] to provide your oncology and hematology care.  To afford each patient quality time with our provider, please arrive at least 15 minutes before your scheduled appointment time.  ? ?If you have a lab appointment with the [facility] please come in thru the Main Entrance and check in at the main information desk. ? ?You need to re-schedule your appointment should you arrive 10 or more minutes late.  We strive to give you quality time with our providers, and arriving late affects you and other patients whose appointments are after yours.  Also, if you no show three or more times for appointments you may be dismissed from the clinic at the providers discretion.     ?Again, thank you for choosing [facility].  Our hope is that these requests will decrease the amount of time that you wait before being seen by our physicians.       ?_____________________________________________________________ ? ?Should you have questions after your visit to [facility], please contact our office at [phone] and follow the prompts.  Our office hours are 8:00 a.m. and 4:30 p.m. Monday - Friday.  Please note that voicemails left after 4:00 p.m. may not be returned until the following business day.  We are closed weekends and major holidays.  You do have access to a nurse 24-7, just call the main number to the clinic [phone] and do not press any options, hold on the line and a nurse will answer the phone.   ? ?For prescription refill requests, have your pharmacy contact our office and allow 72 hours.   ? ?Due to Covid, you will need to wear a  mask upon entering the hospital. If you do not have a mask, a mask will be given to you at the Main Entrance upon arrival. For doctor visits, patients may have 1 support person age 67 or older with them. For treatment visits, patients can not have anyone with them due to social distancing guidelines and our immunocompromised population.  ? ?  ?

## 2021-12-20 NOTE — Patient Instructions (Signed)
? ? ? ? ? ? Alexandra Price ? 12/20/2021  ?  ? '@PREFPERIOPPHARMACY'$ @ ? ? Your procedure is scheduled on  12/25/2021. ? ? Report to [name] at  1230 P.M. ? ? Call this number if you have problems the morning of surgery: ? [phone] ? ? Remember: ? Follow the diet and prep instructions given to you by the office. ? ?  Use your inhaler before you come and bring your rescue inhaler with you. ?  ? Take these medicines the morning of surgery with A SIP OF WATER  ? ?          xanax, lexapro, hydrocodone(if needed), protonix. ?  ? ? Do not wear jewelry, make-up or nail polish. ? Do not wear lotions, powders, or perfumes, or deodorant. ? Do not shave 48 hours prior to surgery.  Men may shave face and neck. ? Do not bring valuables to the hospital. ? [facility] is not responsible for any belongings or valuables. ? ?Contacts, dentures or bridgework may not be worn into surgery.  Leave your suitcase in the car.  After surgery it may be brought to your room. ? ?For patients admitted to the hospital, discharge time will be determined by your treatment team. ? ?Patients discharged the day of surgery will not be allowed to drive home and must have someone with them for 24 hours.  ? ? ?Special instructions:   DO NOT smoke tobacco or vape for 24 hours before your procedure. ? ?Please read over the following fact sheets that you were given. ?Anesthesia Post-op Instructions and Care and Recovery After Surgery ?  ? ? ? Colonoscopy, Adult, Care After ?The following information offers guidance on how to care for yourself after your procedure. Your health care provider may also give you more specific instructions. If you have problems or questions, contact your health care provider. ?What can I expect after the procedure? ?After the procedure, it is common to have: ?A small amount of blood in your stool for 24 hours after the procedure. ?Some gas. ?Mild cramping or bloating of your abdomen. ?Follow these instructions at home: ?Eating  and drinking ? ?Drink enough fluid to keep your urine pale yellow. ?Follow instructions from your health care provider about eating or drinking restrictions. ?Resume your normal diet as told by your health care provider. Avoid heavy or fried foods that are hard to digest. ?Activity ?Rest as told by your health care provider. ?Avoid sitting for a long time without moving. Get up to take short walks every 1-2 hours. This is important to improve blood flow and breathing. Ask for help if you feel weak or unsteady. ?Return to your normal activities as told by your health care provider. Ask your health care provider what activities are safe for you. ?Managing cramping and bloating ? ?Try walking around when you have cramps or feel bloated. ?If directed, apply heat to your abdomen as told by your health care provider. Use the heat source that your health care provider recommends, such as a moist heat pack or a heating pad. ?Place a towel between your skin and the heat source. ?Leave the heat on for 20-30 minutes. ?Remove the heat if your skin turns bright red. This is especially important if you are unable to feel pain, heat, or cold. You have a greater risk of getting burned. ?General instructions ?If you were given a sedative during the procedure, it can affect you for several hours. Do not drive or operate  machinery until your health care provider says that it is safe. ?For the first 24 hours after the procedure: ?Do not sign important documents. ?Do not drink alcohol. ?Do your regular daily activities at a slower pace than normal. ?Eat soft foods that are easy to digest. ?Take over-the-counter and prescription medicines only as told by your health care provider. ?Keep all follow-up visits. This is important. ?Contact a health care provider if: ?You have blood in your stool 2-3 days after the procedure. ?Get help right away if: ?You have more than a small spotting of blood in your stool. ?You have large blood clots in  your stool. ?You have swelling of your abdomen. ?You have nausea or vomiting. ?You have a fever. ?You have increasing pain in your abdomen that is not relieved with medicine. ?These symptoms may be an emergency. Get help right away. Call 911. ?Do not wait to see if the symptoms will go away. ?Do not drive yourself to the hospital. ?Summary ?After the procedure, it is common to have a small amount of blood in your stool. You may also have mild cramping and bloating of your abdomen. ?If you were given a sedative during the procedure, it can affect you for several hours. Do not drive or operate machinery until your health care provider says that it is safe. ?Get help right away if you have a lot of blood in your stool, nausea or vomiting, a fever, or increased pain in your abdomen. ?This information is not intended to replace advice given to you by your health care provider. Make sure you discuss any questions you have with your health care provider. ?Document Revised: 03/29/2021 Document Reviewed: 03/29/2021 ?Elsevier Patient Education ? [place]. ?Monitored Anesthesia Care, Care After ?This sheet gives you information about how to care for yourself after your procedure. Your health care provider may also give you more specific instructions. If you have problems or questions, contact your health care provider. ?What can I expect after the procedure? ?After the procedure, it is common to have: ?Tiredness. ?Forgetfulness about what happened after the procedure. ?Impaired judgment for important decisions. ?Nausea or vomiting. ?Some difficulty with balance. ?Follow these instructions at home: ?For the time period you were told by your health care provider: ? ?  ? ?Rest as needed. ?Do not participate in activities where you could fall or become injured. ?Do not drive or use machinery. ?Do not drink alcohol. ?Do not take sleeping pills or medicines that cause drowsiness. ?Do not make important decisions or sign  legal documents. ?Do not take care of children on your own. ?Eating and drinking ?Follow the diet that is recommended by your health care provider. ?Drink enough fluid to keep your urine pale yellow. ?If you vomit: ?Drink water, juice, or soup when you can drink without vomiting. ?Make sure you have little or no nausea before eating solid foods. ?General instructions ?Have a responsible adult stay with you for the time you are told. It is important to have someone help care for you until you are awake and alert. ?Take over-the-counter and prescription medicines only as told by your health care provider. ?If you have sleep apnea, surgery and certain medicines can increase your risk for breathing problems. Follow instructions from your health care provider about wearing your sleep device: ?Anytime you are sleeping, including during daytime naps. ?While taking prescription pain medicines, sleeping medicines, or medicines that make you drowsy. ?Avoid smoking. ?Keep all follow-up visits as told by your health care  provider. This is important. ?Contact a health care provider if: ?You keep feeling nauseous or you keep vomiting. ?You feel light-headed. ?You are still sleepy or having trouble with balance after 24 hours. ?You develop a rash. ?You have a fever. ?You have redness or swelling around the IV site. ?Get help right away if: ?You have trouble breathing. ?You have new-onset confusion at home. ?Summary ?For several hours after your procedure, you may feel tired. You may also be forgetful and have poor judgment. ?Have a responsible adult stay with you for the time you are told. It is important to have someone help care for you until you are awake and alert. ?Rest as told. Do not drive or operate machinery. Do not drink alcohol or take sleeping pills. ?Get help right away if you have trouble breathing, or if you suddenly become confused. ?This information is not intended to replace advice given to you by your health  care provider. Make sure you discuss any questions you have with your health care provider. ?Document Revised: 07/11/2021 Document Reviewed: 07/09/2019 ?Elsevier Patient Education ? [place]. ? ?

## 2021-12-20 NOTE — Progress Notes (Signed)
? ?[facility] ?[address] ?[place], [place] [address] ? ? ?CLINIC:  ?Medical Oncology/Hematology ? ?PCP:  ?[name], MD ?[address] [place] / [place] [address]  ?[phone] ? ?REASON FOR VISIT:  ?Follow-up for multiple myeloma ? ?PRIOR THERAPY:  ?1. KPD x 4 cycles from 04/17/2018 to 08/21/2018. ?2. Stem cell transplant on 10/23/2018. ?3. Radiation to left and right humerus 25 Gy in 10 fractions from 08/23/2020 to 08/25/2020. ?  ?CURRENT THERAPY: Maintenance Pomalyst 3/4 weeks ? ?INTERVAL HISTORY:  ?[name], a 76 y.o. female, returns for routine follow-up for her multiple myeloma. Alexandra Price was last seen on 11/22/2021. ? ?Today Alexandra Price reports feeling well. Her back pain is stable, and Alexandra Price continues to take hydrocodone 4 times daily. Alexandra Price reports fatigue.  ? ?REVIEW OF SYSTEMS:  ?Review of Systems  ?Constitutional:  Positive for fatigue. Negative for appetite change.  ?Respiratory:  Positive for shortness of breath.   ?Cardiovascular:  Positive for chest pain.  ?Gastrointestinal:  Positive for abdominal pain and constipation.  ?Musculoskeletal:  Positive for back pain.  ?All other systems reviewed and are negative. ? ?PAST MEDICAL/SURGICAL HISTORY:  ?Past Medical History:  ?Diagnosis Date  ? Anxiety   ? Depression   ? Hypertension   ? Multiple myeloma ([place])   ? multiple myeloma  ? ?Past Surgical History:  ?Procedure Laterality Date  ? ABDOMINAL HYSTERECTOMY    ? total  ? APPENDECTOMY    ? LAPAROSCOPIC APPENDECTOMY N/A 05/20/2018  ? Procedure: APPENDECTOMY LAPAROSCOPIC;  Surgeon: [name], MD;  Location: [facility];  Service: General;  Laterality: N/A;  ? PORTACATH PLACEMENT Right 04/14/2018  ? Procedure: INSERTION PORT-A-CATH;  Surgeon: [name], MD;  Location: [facility];  Service: General;  Laterality: Right;  ? ? ?SOCIAL HISTORY:  ?Social History  ? ?Socioeconomic History  ? Marital status: Legally Separated  ?  Spouse name: Not on file  ? Number of children: 6  ? Years of education: Not  on file  ? Highest education level: Not on file  ?Occupational History  ?  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 Smoking status: Never  ? Smokeless tobacco: Never  ?Vaping Use  ? Vaping Use: Never used  ?Substance and Sexual Activity  ? Alcohol use: Never  ? Drug use: Not Currently  ? Sexual activity: Not Currently  ?Other Topics Concern  ? Not on file  ?Social History Narrative  ? Not on file  ? ?Social Determinants of Health  ? ?Financial Resource Strain: Not on file  ?Food Insecurity: Not on file  ?Transportation Needs: Not on file  ?Physical Activity: Not on file  ?Stress: Not on file  ?Social Connections: Not on file  ?Intimate Partner Violence: Not on file  ? ? ?FAMILY HISTORY:  ?Family History  ?Problem Relation Age of Onset  ? Heart disease Mother   ? Emphysema Father   ? Diabetes Sister   ? Depression Sister   ? Cancer Brother   ?     liver, lung, and colon  ? Diabetes Brother   ? ? ?CURRENT MEDICATIONS:  ?Current Outpatient Medications  ?Medication Sig Dispense Refill  ? acyclovir (ZOVIRAX) 400 MG tablet Take 1 tablet (400 mg total) by mouth 2 (two) times daily. 60 tablet 4  ? albuterol (VENTOLIN HFA) 108 (90 Base) MCG/ACT inhaler Inhale 2 puffs into the lungs in the morning, at noon, in the evening, and at bedtime. (Patient not taking: Reported on 12/20/2021) 8 g 0  ?  ALPRAZolam (XANAX) 0.5 MG tablet Take one tablet during the day as needed for anxiety and two tablets at bedtime as needed for anxiety/insomnia (Patient taking differently: See admin instructions. Take one tablet during the day as needed for anxiety and two tablets at bedtime as needed for anxiety/insomnia) 90 tablet 3  ? apixaban (ELIQUIS) 5 MG TABS tablet Take 5 mg by mouth 2 (two) times daily.    ? Calcium Carb-Cholecalciferol (CALCIUM 1000 + D PO) Take 1,000 mg by mouth daily. Take one tablet daily until next visit with oncologist.    ? escitalopram (LEXAPRO) 20 MG tablet Take 20 mg by mouth daily.    ?  HYDROcodone-acetaminophen (NORCO) 7.5-325 MG tablet Take 1 tablet by mouth every 6 (six) hours as needed for moderate pain. 120 tablet 0  ? loperamide (IMODIUM) 2 MG capsule Take 1 capsule (2 mg total) by mouth as needed for diarrhea or loose stools. 30 capsule 0  ? pantoprazole (PROTONIX) 40 MG tablet Take 40 mg by mouth daily.    ? polyethylene glycol-electrolytes (NULYTELY) 420 g solution As directed 4000 mL 0  ? pomalidomide (POMALYST) 2 MG capsule Take 1 capsule (2 mg total) by mouth daily. 21 days on, 7 days off (Patient taking differently: Take 2 mg by mouth See admin instructions. 21 days on, 7 days off) 21 capsule 0  ? rosuvastatin (CRESTOR) 10 MG tablet Take 10 mg by mouth daily.    ? zolpidem (AMBIEN) 10 MG tablet Take 1 tablet (10 mg total) by mouth at bedtime as needed for sleep. (Patient taking differently: Take 10 mg by mouth at bedtime.) 30 tablet 2  ? ?No current facility-administered medications for this visit.  ? ? ?ALLERGIES:  ?Allergies  ?Allergen Reactions  ? Ciprofloxacin Anaphylaxis  ? Amoxicillin Other (See Comments)  ?  unknown  ? Morphine And Related Nausea And Vomiting  ? Augmentin [Amoxicillin-Pot Clavulanate] Other (See Comments)  ?  Headache, insomnia  ? ? ?PHYSICAL EXAM:  ?Performance status (ECOG): 1 - Symptomatic but completely ambulatory ? ?Vitals:  ? 12/20/21 1439  ?BP: 138/80  ?Pulse: (!) 59  ?Resp: 18  ?Temp: 97.9 ?F (36.6 ?C)  ?SpO2: 100%  ? ?Wt Readings from Last 3 Encounters:  ?12/20/21 179 lb 3.7 oz (81.3 kg)  ?12/13/21 181 lb 6.4 oz (82.3 kg)  ?11/22/21 174 lb 1.6 oz (79 kg)  ? ?Physical Exam ?Vitals reviewed.  ?Constitutional:   ?   Appearance: Normal appearance.  ?Cardiovascular:  ?   Rate and Rhythm: Normal rate and regular rhythm.  ?   Pulses: Normal pulses.  ?   Heart sounds: Normal heart sounds.  ?Pulmonary:  ?   Effort: Pulmonary effort is normal.  ?   Breath sounds: Normal breath sounds.  ?Neurological:  ?   General: No focal deficit present.  ?   Mental Status: Alexandra Price  is alert and oriented to person, place, and time.  ?Psychiatric:     ?   Mood and Affect: Mood normal.     ?   Behavior: Behavior normal.  ? ? ?LABORATORY DATA:  ?I have reviewed the labs as listed.  ? ?  Latest Ref Rng & Units 12/14/2021  ? 11:03 AM 09/19/2021  ? 10:18 AM 07/25/2021  ?  3:38 PM  ?CBC  ?WBC 4.0 - 10.5 K/uL 2.4   2.4   2.2    ?Hemoglobin 12.0 - 15.0 g/dL 9.9   9.3   9.5    ?Hematocrit 36.0 - 46.0 %  29.8   29.3   29.2    ?Platelets 150 - 400 K/uL 98   101   92    ? ? ?  Latest Ref Rng & Units 12/14/2021  ? 11:03 AM 09/19/2021  ? 10:18 AM 07/25/2021  ?  3:38 PM  ?CMP  ?Glucose 70 - 99 mg/dL 120   126   107    ?BUN 8 - 23 mg/dL 30   24   25     ?Creatinine 0.44 - 1.00 mg/dL 1.39   1.45   1.46    ?Sodium 135 - 145 mmol/L 140   139   136    ?Potassium 3.5 - 5.1 mmol/L 4.4   4.0   4.0    ?Chloride 98 - 111 mmol/L 107   106   103    ?CO2 22 - 32 mmol/L 29   29   26     ?Calcium 8.9 - 10.3 mg/dL 8.6   8.5   8.4    ?Total Protein 6.5 - 8.1 g/dL 6.6   6.6   6.5    ?Total Bilirubin 0.3 - 1.2 mg/dL 0.5   0.3   0.3    ?Alkaline Phos 38 - 126 U/L 61   52   59    ?AST 15 - 41 U/L 19   20   18     ?ALT 0 - 44 U/L 19   18   19     ? ?   ?Component Value Date/Time  ? RBC 2.92 (L) 12/14/2021 1103  ? MCV 102.1 (H) 12/14/2021 1103  ? MCH 33.9 12/14/2021 1103  ? MCHC 33.2 12/14/2021 1103  ? RDW 13.1 12/14/2021 1103  ? LYMPHSABS 0.8 12/14/2021 1103  ? MONOABS 0.4 12/14/2021 1103  ? EOSABS 0.3 12/14/2021 1103  ? BASOSABS 0.0 12/14/2021 1103  ? ? ?DIAGNOSTIC IMAGING:  ?I have independently reviewed the scans and discussed with the patient. ?NM PET Image Restage (PS) Whole Body ? ?Result Date: 12/15/2021 ?CLINICAL DATA:  Subsequent treatment strategy for multiple myeloma/restaging. EXAM: NUCLEAR MEDICINE PET WHOLE BODY TECHNIQUE: 9.5 mCi F-18 FDG was injected intravenously. Full-ring PET imaging was performed from the head to foot after the radiotracer. CT data was obtained and used for attenuation correction and anatomic localization.  Fasting blood glucose: 180 mg/dl COMPARISON:  08/23/2020 PET FINDINGS: Mediastinal blood pool activity: SUV max 2.1 Extensive muscular activity throughout, mildly limited exam. HEAD/NECK: No areas of abnormal hypermetabolis

## 2021-12-21 ENCOUNTER — Encounter (HOSPITAL_COMMUNITY): Payer: Self-pay

## 2021-12-21 ENCOUNTER — Encounter (HOSPITAL_COMMUNITY)
Admission: RE | Admit: 2021-12-21 | Discharge: 2021-12-21 | Disposition: A | Discharge status: Home / Self Care | Payer: Medicare Other | Source: Ambulatory Visit | Attending: Internal Medicine | Admitting: Internal Medicine

## 2021-12-25 ENCOUNTER — Ambulatory Visit (HOSPITAL_COMMUNITY): Payer: Medicare Other | Admitting: Certified Registered Nurse Anesthetist

## 2021-12-25 ENCOUNTER — Other Ambulatory Visit (HOSPITAL_COMMUNITY): Payer: Self-pay

## 2021-12-25 ENCOUNTER — Ambulatory Visit (HOSPITAL_BASED_OUTPATIENT_CLINIC_OR_DEPARTMENT_OTHER): Payer: Medicare Other | Admitting: Certified Registered Nurse Anesthetist

## 2021-12-25 ENCOUNTER — Encounter (HOSPITAL_COMMUNITY): Payer: Self-pay

## 2021-12-25 ENCOUNTER — Ambulatory Visit (HOSPITAL_COMMUNITY)
Admission: RE | Admit: 2021-12-25 | Discharge: 2021-12-25 | Disposition: A | Discharge status: Home / Self Care | Payer: Medicare Other | Attending: Internal Medicine | Admitting: Internal Medicine

## 2021-12-25 ENCOUNTER — Encounter (HOSPITAL_COMMUNITY)
Admission: RE | Disposition: A | Discharge status: Home / Self Care | Payer: Self-pay | Source: Home / Self Care | Attending: Internal Medicine

## 2021-12-25 DIAGNOSIS — Z800 Family history of malignant neoplasm of digestive organs: Secondary | ICD-10-CM | POA: Insufficient documentation

## 2021-12-25 DIAGNOSIS — K5730 Diverticulosis of large intestine without perforation or abscess without bleeding: Secondary | ICD-10-CM | POA: Insufficient documentation

## 2021-12-25 DIAGNOSIS — Z1211 Encounter for screening for malignant neoplasm of colon: Secondary | ICD-10-CM | POA: Insufficient documentation

## 2021-12-25 DIAGNOSIS — N186 End stage renal disease: Secondary | ICD-10-CM | POA: Diagnosis not present

## 2021-12-25 DIAGNOSIS — I120 Hypertensive chronic kidney disease with stage 5 chronic kidney disease or end stage renal disease: Secondary | ICD-10-CM | POA: Insufficient documentation

## 2021-12-25 DIAGNOSIS — D124 Benign neoplasm of descending colon: Secondary | ICD-10-CM | POA: Diagnosis not present

## 2021-12-25 DIAGNOSIS — D125 Benign neoplasm of sigmoid colon: Secondary | ICD-10-CM | POA: Insufficient documentation

## 2021-12-25 DIAGNOSIS — F32A Depression, unspecified: Secondary | ICD-10-CM | POA: Insufficient documentation

## 2021-12-25 DIAGNOSIS — K648 Other hemorrhoids: Secondary | ICD-10-CM | POA: Insufficient documentation

## 2021-12-25 DIAGNOSIS — K635 Polyp of colon: Secondary | ICD-10-CM

## 2021-12-25 DIAGNOSIS — F419 Anxiety disorder, unspecified: Secondary | ICD-10-CM | POA: Insufficient documentation

## 2021-12-25 HISTORY — PX: POLYPECTOMY: SHX5525

## 2021-12-25 HISTORY — PX: COLONOSCOPY WITH PROPOFOL: SHX5780

## 2021-12-25 SURGERY — COLONOSCOPY WITH PROPOFOL
Anesthesia: General

## 2021-12-25 MED ORDER — PROPOFOL 500 MG/50ML IV EMUL
INTRAVENOUS | Status: DC | PRN
  Administered 2021-12-25: 125 ug/kg/min via INTRAVENOUS

## 2021-12-25 MED ORDER — LIDOCAINE HCL (CARDIAC) PF 100 MG/5ML IV SOSY
PREFILLED_SYRINGE | INTRAVENOUS | Status: DC | PRN
Start: 2021-12-25 — End: 2021-12-25
  Administered 2021-12-25: 50 mg via INTRAVENOUS

## 2021-12-25 MED ORDER — PROPOFOL 10 MG/ML IV BOLUS
INTRAVENOUS | Status: DC | PRN
  Administered 2021-12-25: 100 mg via INTRAVENOUS

## 2021-12-25 MED ORDER — LACTATED RINGERS IV SOLN: INTRAVENOUS | Status: DC | PRN

## 2021-12-25 MED ORDER — HYDROCODONE-ACETAMINOPHEN 7.5-325 MG PO TABS: 1.0000 | ORAL_TABLET | Freq: Four times a day (QID) | ORAL | 0 refills | Status: DC | PRN

## 2021-12-25 NOTE — Op Note (Signed)
[facility] ?Patient Name: Alexandra Price ?Procedure Date: 12/25/2021 2:40 PM ?MRN: [id number] ?Date of Birth: [birth year] ?Attending MD: Elon Alas. Abbey Chatters , DO ?CSN: [id number] ?Age: 76 ?Admit Type: Outpatient ?Procedure:                Colonoscopy ?Indications:              Screening for colorectal malignant neoplasm ?Providers:                Elon Alas. Abbey Chatters, DO, [place] Page, Raphael Gibney,  ?                          Technician ?Referring MD:              ?Medicines:                See the Anesthesia note for documentation of the  ?                          administered medications ?Complications:            No immediate complications. ?Estimated Blood Loss:     Estimated blood loss was minimal. ?Procedure:                Pre-Anesthesia Assessment: ?                          - The anesthesia plan was to use monitored  ?                          anesthesia care (MAC). ?                          After obtaining informed consent, the colonoscope  ?                          was passed under direct vision. Throughout the  ?                          procedure, the patient's blood pressure, pulse, and  ?                          oxygen saturations were monitored continuously. The  ?                          PCF-HQ190L ([id number]) scope was introduced through  ?                          the anus and advanced to the the cecum, identified  ?                          by appendiceal orifice and ileocecal valve. The  ?                          colonoscopy was performed without difficulty. The  ?                          patient tolerated the procedure well. The quality  ?  of the bowel preparation was evaluated using the  ?                          BBPS [facility] Bowel Preparation Scale) with scores  ?                          of: Right Colon = 3, Transverse Colon = 3 and Left  ?                          Colon = 3 (entire mucosa seen well with no residual  ?                          staining, small  fragments of stool or opaque  ?                          liquid). The total BBPS score equals 9. ?Findings: ?     The perianal and digital rectal examinations were normal. ?     Non-bleeding internal hemorrhoids were found during endoscopy. ?     Multiple small-mouthed diverticula were found in the sigmoid colon. ?     A 11 mm polyp was found in the sigmoid colon. The polyp was sessile. The  ?     polyp was removed with a cold snare. Resection and retrieval were  ?     complete. ?     A 5 mm polyp was found in the descending colon. The polyp was sessile.  ?     The polyp was removed with a cold snare. Resection and retrieval were  ?     complete. ?     The exam was otherwise without abnormality. ?Impression:               - Non-bleeding internal hemorrhoids. ?                          - Diverticulosis in the sigmoid colon. ?                          - One 11 mm polyp in the sigmoid colon, removed  ?                          with a cold snare. Resected and retrieved. ?                          - One 5 mm polyp in the descending colon, removed  ?                          with a cold snare. Resected and retrieved. ?                          - The examination was otherwise normal. ?Moderate Sedation: ?     Per Anesthesia Care ?Recommendation:           - Patient has a contact number available for  ?  emergencies. The signs and symptoms of potential  ?                          delayed complications were discussed with the  ?                          patient. Return to normal activities tomorrow.  ?                          Written discharge instructions were provided to the  ?                          patient. ?                          - Resume previous diet. ?                          - Continue present medications. ?                          - Await pathology results. ?                          - Repeat colonoscopy in 5 years for surveillance. ?                          - Return to GI clinic  PRN. ?Procedure Code(s):        --- Professional --- ?                          [phone], Colonoscopy, flexible; with removal of  ?                          tumor(s), polyp(s), or other lesion(s) by snare  ?                          technique ?Diagnosis Code(s):        --- Professional --- ?                          Z12.11, Encounter for screening for malignant  ?                          neoplasm of colon ?                          K64.8, Other hemorrhoids ?                          K63.5, Polyp of colon ?                          K57.30, Diverticulosis of large intestine without  ?                          perforation or abscess without bleeding ?CPT copyright 2019 American Medical  Association. All rights reserved. ?The codes documented in this report are preliminary and upon coder review may  ?be revised to meet current compliance requirements. ?Elon Alas. Abbey Chatters, DO ?Elon Alas. [place], DO ?12/25/2021 3:00:18 PM ?This report has been signed electronically. ?Number of Addenda: 0 ?

## 2021-12-25 NOTE — Anesthesia Preprocedure Evaluation (Signed)
Anesthesia Evaluation  ?Patient identified by MRN, date of birth, ID band ?Patient awake ? ? ? ?Reviewed: ?Allergy & Precautions, H&P , NPO status , Patient's Chart, lab work & pertinent test results, reviewed documented beta blocker date and time  ? ?Airway ?Mallampati: II ? ?TM Distance: >3 FB ?Neck ROM: full ? ? ? Dental ?no notable dental hx. ? ?  ?Pulmonary ?neg pulmonary ROS,  ?  ?Pulmonary exam normal ?breath sounds clear to auscultation ? ? ? ? ? ? Cardiovascular ?Exercise Tolerance: Good ?hypertension, negative cardio ROS ? ? ?Rhythm:regular Rate:Normal ? ? ?  ?Neuro/Psych ?PSYCHIATRIC DISORDERS Anxiety Depression negative neurological ROS ?   ? GI/Hepatic ?negative GI ROS, Neg liver ROS,   ?Endo/Other  ?negative endocrine ROS ? Renal/GU ?ESRF and CRFRenal disease  ?negative genitourinary ?  ?Musculoskeletal ? ? Abdominal ?  ?Peds ? Hematology ?negative hematology ROS ?(+)   ?Anesthesia Other Findings ? ? Reproductive/Obstetrics ?negative OB ROS ? ?  ? ? ? ? ? ? ? ? ? ? ? ? ? ?  ?  ? ? ? ? ? ? ? ? ?Anesthesia Physical ?Anesthesia Plan ? ?ASA: 3 ? ?Anesthesia Plan: General  ? ?Post-op Pain Management:   ? ?Induction:  ? ?PONV Risk Score and Plan: Propofol infusion ? ?Airway Management Planned:  ? ?Additional Equipment:  ? ?Intra-op Plan:  ? ?Post-operative Plan:  ? ?Informed Consent: I have reviewed the patients History and Physical, chart, labs and discussed the procedure including the risks, benefits and alternatives for the proposed anesthesia with the patient or authorized representative who has indicated his/her understanding and acceptance.  ? ? ? ?Dental Advisory Given ? ?Plan Discussed with: CRNA ? ?Anesthesia Plan Comments:   ? ? ? ? ? ? ?Anesthesia Quick Evaluation ? ?

## 2021-12-25 NOTE — Discharge Instructions (Addendum)
?  Colonoscopy ?Discharge Instructions ? ?Read the instructions outlined below and refer to this sheet in the next few weeks. These discharge instructions provide you with general information on caring for yourself after you leave the hospital. Your doctor may also give you specific instructions. While your treatment has been planned according to the most current medical practices available, unavoidable complications occasionally occur.  ? ?ACTIVITY ?You may resume your regular activity, but move at a slower pace for the next 24 hours.  ?Take frequent rest periods for the next 24 hours.  ?Walking will help get rid of the air and reduce the bloated feeling in your belly (abdomen).  ?No driving for 24 hours (because of the medicine (anesthesia) used during the test).   ?Do not sign any important legal documents or operate any machinery for 24 hours (because of the anesthesia used during the test).  ?NUTRITION ?Drink plenty of fluids.  ?You may resume your normal diet as instructed by your doctor.  ?Begin with a light meal and progress to your normal diet. Heavy or fried foods are harder to digest and may make you feel sick to your stomach (nauseated).  ?Avoid alcoholic beverages for 24 hours or as instructed.  ?MEDICATIONS ?You may resume your normal medications unless your doctor tells you otherwise.  ?WHAT YOU CAN EXPECT TODAY ?Some feelings of bloating in the abdomen.  ?Passage of more gas than usual.  ?Spotting of blood in your stool or on the toilet paper.  ?IF YOU HAD POLYPS REMOVED DURING THE COLONOSCOPY: ?No aspirin products for 7 days or as instructed.  ?No alcohol for 7 days or as instructed.  ?Eat a soft diet for the next 24 hours.  ?FINDING OUT THE RESULTS OF YOUR TEST ?Not all test results are available during your visit. If your test results are not back during the visit, make an appointment with your caregiver to find out the results. Do not assume everything is normal if you have not heard from your  caregiver or the medical facility. It is important for you to follow up on all of your test results.  ?SEEK IMMEDIATE MEDICAL ATTENTION IF: ?You have more than a spotting of blood in your stool.  ?Your belly is swollen (abdominal distention).  ?You are nauseated or vomiting.  ?You have a temperature over 101.  ?You have abdominal pain or discomfort that is severe or gets worse throughout the day.  ? ?Your colonoscopy revealed 2 polyp(s) which I removed successfully. Await pathology results, my office will contact you. I recommend repeating colonoscopy in 5 years for surveillance purposes. You also have diverticulosis and internal hemorrhoids. I would recommend increasing fiber in your diet or adding OTC Benefiber/Metamucil. Be sure to drink at least 4 to 6 glasses of water daily. Follow-up with GI in 4 months.  ? ?Okay to restart your Eliquis tomorrow.  ? ? ? ?I hope you have a great rest of your week! ? ?Elon Alas. Abbey Chatters, D.O. ?Gastroenterology and Hepatology ?[facility] ? ?

## 2021-12-25 NOTE — Anesthesia Procedure Notes (Signed)
Date/Time: 12/25/2021 2:45 PM ?Performed by: Karna Dupes, CRNA ?Pre-anesthesia Checklist: Patient identified, Emergency Drugs available, Suction available and Patient being monitored ?Oxygen Delivery Method: Nasal cannula ? ? ? ? ?

## 2021-12-25 NOTE — Interval H&P Note (Signed)
History and Physical Interval Note: ? ?12/25/2021 ?2:13 PM ? ?Alexandra Price  has presented today for surgery, with the diagnosis of fh COLON CANCER.  The various methods of treatment have been discussed with the patient and family. After consideration of risks, benefits and other options for treatment, the patient has consented to  Procedure(s) with comments: ?COLONOSCOPY WITH PROPOFOL (N/A) - 2:30pm as a surgical intervention.  The patient's history has been reviewed, patient examined, no change in status, stable for surgery.  I have reviewed the patient's chart and labs.  Questions were answered to the patient's satisfaction.   ? ? ?[name] ? ? ?

## 2021-12-25 NOTE — Anesthesia Postprocedure Evaluation (Signed)
Anesthesia Post Note ? ?Patient: Alexandra Price ? ?Procedure(s) Performed: COLONOSCOPY WITH PROPOFOL ?POLYPECTOMY ? ?Patient location during evaluation: Phase II ?Anesthesia Type: General ?Level of consciousness: awake ?Pain management: pain level controlled ?Vital Signs Assessment: post-procedure vital signs reviewed and stable ?Respiratory status: spontaneous breathing and respiratory function stable ?Cardiovascular status: blood pressure returned to baseline and stable ?Postop Assessment: no headache and no apparent nausea or vomiting ?Anesthetic complications: no ?Comments: Late entry ? ? ?No notable events documented. ? ? ?Last Vitals:  ?Vitals:  ? 12/25/21 1419 12/25/21 1500  ?BP: (!) 154/78 (!) 113/50  ?Pulse: (!) 54 (!) 56  ?Resp: 14 17  ?Temp: 36.4 ?C 36.5 ?C  ?SpO2: 100% 99%  ?  ?Last Pain:  ?Vitals:  ? 12/25/21 1500  ?TempSrc: Oral  ?PainSc: 0-No pain  ? ? ?  ?  ?  ?  ?  ?  ? ?Louann Sjogren ? ? ? ? ?

## 2021-12-25 NOTE — Transfer of Care (Signed)
Immediate Anesthesia Transfer of Care Note ? ?Patient: Alexandra Price ? ?Procedure(s) Performed: COLONOSCOPY WITH PROPOFOL ?POLYPECTOMY ? ?Patient Location: Short Stay ? ?Anesthesia Type:General ? ?Level of Consciousness: awake and alert  ? ?Airway & Oxygen Therapy: Patient Spontanous Breathing ? ?Post-op Assessment: Report given to RN and Post -op Vital signs reviewed and stable ? ?Post vital signs: Reviewed and stable ? ?Last Vitals:  ?Vitals Value Taken Time  ?BP 113/50 12/25/21 1500  ?Temp 36.5 ?C 12/25/21 1500  ?Pulse 56 12/25/21 1500  ?Resp 17 12/25/21 1500  ?SpO2 99 % 12/25/21 1500  ? ? ?Last Pain:  ?Vitals:  ? 12/25/21 1500  ?TempSrc: Oral  ?PainSc: 0-No pain  ?   ? ?Patients Stated Pain Goal: 8 (12/25/21 1419) ? ?Complications: No notable events documented. ?

## 2021-12-27 LAB — SURGICAL PATHOLOGY

## 2022-01-01 ENCOUNTER — Encounter (HOSPITAL_COMMUNITY): Payer: Self-pay | Admitting: Internal Medicine

## 2022-01-01 ENCOUNTER — Other Ambulatory Visit (HOSPITAL_COMMUNITY): Payer: Medicare Other

## 2022-01-05 ENCOUNTER — Other Ambulatory Visit (HOSPITAL_COMMUNITY): Payer: Self-pay | Admitting: Hematology

## 2022-01-05 DIAGNOSIS — C9000 Multiple myeloma not having achieved remission: Secondary | ICD-10-CM

## 2022-01-05 NOTE — Telephone Encounter (Signed)
Chart reviewed. Pomalyst refilled per last office note with Dr. Katragadda.  

## 2022-01-08 ENCOUNTER — Ambulatory Visit (HOSPITAL_COMMUNITY): Payer: Medicare Other | Admitting: Hematology

## 2022-01-24 ENCOUNTER — Other Ambulatory Visit (HOSPITAL_COMMUNITY): Payer: Self-pay

## 2022-01-24 MED ORDER — HYDROCODONE-ACETAMINOPHEN 7.5-325 MG PO TABS: 1.0000 | ORAL_TABLET | Freq: Four times a day (QID) | ORAL | 0 refills | Status: DC | PRN

## 2022-01-31 ENCOUNTER — Other Ambulatory Visit (HOSPITAL_COMMUNITY): Payer: Self-pay | Admitting: Hematology

## 2022-01-31 DIAGNOSIS — C9000 Multiple myeloma not having achieved remission: Secondary | ICD-10-CM

## 2022-02-01 ENCOUNTER — Other Ambulatory Visit (HOSPITAL_COMMUNITY): Payer: Self-pay

## 2022-02-01 DIAGNOSIS — C9000 Multiple myeloma not having achieved remission: Secondary | ICD-10-CM

## 2022-02-01 MED ORDER — POMALIDOMIDE 2 MG PO CAPS: ORAL_CAPSULE | ORAL | 0 refills | Status: DC

## 2022-02-01 NOTE — Telephone Encounter (Signed)
Chart reviewed. Pomalyst refilled per last office note with Dr. Katragadda.  

## 2022-02-07 ENCOUNTER — Inpatient Hospital Stay (HOSPITAL_COMMUNITY): Payer: Medicare Other | Attending: Hematology

## 2022-02-07 DIAGNOSIS — D539 Nutritional anemia, unspecified: Secondary | ICD-10-CM | POA: Insufficient documentation

## 2022-02-07 DIAGNOSIS — Z86711 Personal history of pulmonary embolism: Secondary | ICD-10-CM | POA: Insufficient documentation

## 2022-02-07 DIAGNOSIS — Z7901 Long term (current) use of anticoagulants: Secondary | ICD-10-CM | POA: Insufficient documentation

## 2022-02-07 DIAGNOSIS — F419 Anxiety disorder, unspecified: Secondary | ICD-10-CM | POA: Diagnosis not present

## 2022-02-07 DIAGNOSIS — C9001 Multiple myeloma in remission: Secondary | ICD-10-CM

## 2022-02-07 DIAGNOSIS — C9000 Multiple myeloma not having achieved remission: Secondary | ICD-10-CM | POA: Diagnosis present

## 2022-02-07 DIAGNOSIS — Z79899 Other long term (current) drug therapy: Secondary | ICD-10-CM | POA: Diagnosis not present

## 2022-02-07 LAB — CBC WITH DIFFERENTIAL/PLATELET
Abs Immature Granulocytes: 0 10*3/uL (ref 0.00–0.07)
Basophils Absolute: 0.1 10*3/uL (ref 0.0–0.1)
Basophils Relative: 2 %
Eosinophils Absolute: 0.3 10*3/uL (ref 0.0–0.5)
Eosinophils Relative: 8 %
HCT: 29.2 % — ABNORMAL LOW (ref 36.0–46.0)
Hemoglobin: 9.7 g/dL — ABNORMAL LOW (ref 12.0–15.0)
Immature Granulocytes: 0 %
Lymphocytes Relative: 27 %
Lymphs Abs: 0.9 10*3/uL (ref 0.7–4.0)
MCH: 33.8 pg (ref 26.0–34.0)
MCHC: 33.2 g/dL (ref 30.0–36.0)
MCV: 101.7 fL — ABNORMAL HIGH (ref 80.0–100.0)
Monocytes Absolute: 0.5 10*3/uL (ref 0.1–1.0)
Monocytes Relative: 16 %
Neutro Abs: 1.5 10*3/uL — ABNORMAL LOW (ref 1.7–7.7)
Neutrophils Relative %: 47 %
Platelets: 122 10*3/uL — ABNORMAL LOW (ref 150–400)
RBC: 2.87 MIL/uL — ABNORMAL LOW (ref 3.87–5.11)
RDW: 13 % (ref 11.5–15.5)
WBC: 3.2 10*3/uL — ABNORMAL LOW (ref 4.0–10.5)
nRBC: 0 % (ref 0.0–0.2)

## 2022-02-07 LAB — COMPREHENSIVE METABOLIC PANEL
ALT: 22 U/L (ref 0–44)
AST: 21 U/L (ref 15–41)
Albumin: 3.9 g/dL (ref 3.5–5.0)
Alkaline Phosphatase: 69 U/L (ref 38–126)
Anion gap: 5 (ref 5–15)
BUN: 22 mg/dL (ref 8–23)
CO2: 29 mmol/L (ref 22–32)
Calcium: 9.6 mg/dL (ref 8.9–10.3)
Chloride: 102 mmol/L (ref 98–111)
Creatinine, Ser: 1.45 mg/dL — ABNORMAL HIGH (ref 0.44–1.00)
GFR, Estimated: 38 mL/min — ABNORMAL LOW (ref >60)
Glucose, Bld: 128 mg/dL — ABNORMAL HIGH (ref 70–99)
Potassium: 4.2 mmol/L (ref 3.5–5.1)
Sodium: 136 mmol/L (ref 135–145)
Total Bilirubin: 0.6 mg/dL (ref 0.3–1.2)
Total Protein: 6.9 g/dL (ref 6.5–8.1)

## 2022-02-07 LAB — VITAMIN B12: Vitamin B-12: 586 pg/mL (ref 180–914)

## 2022-02-07 LAB — FOLATE: Folate: >40 ng/mL (ref >5.9)

## 2022-02-07 MED ORDER — HEPARIN SOD (PORK) LOCK FLUSH 100 UNIT/ML IV SOLN
500.0000 [IU] | Freq: Once | INTRAVENOUS | Status: AC
Start: 2022-02-07 — End: 2022-02-07
  Administered 2022-02-07: 500 [IU] via INTRAVENOUS

## 2022-02-07 MED ORDER — SODIUM CHLORIDE 0.9% FLUSH
10.0000 mL | INTRAVENOUS | Status: DC | PRN
  Administered 2022-02-07: 10 mL via INTRAVENOUS

## 2022-02-07 NOTE — Patient Instructions (Signed)
[facility]  Discharge Instructions: Thank you for choosing [facility] to provide your oncology and hematology care.  If you have a lab appointment with the [facility], please come in thru the Main Entrance and check in at the main information desk.  Wear comfortable clothing and clothing appropriate for easy access to any Portacath or PICC line.   We strive to give you quality time with your provider. You may need to reschedule your appointment if you arrive late (15 or more minutes).  Arriving late affects you and other patients whose appointments are after yours.  Also, if you miss three or more appointments without notifying the office, you may be dismissed from the clinic at the provider's discretion.      For prescription refill requests, have your pharmacy contact our office and allow 72 hours for refills to be completed.    Today you received port flush with labs.   BELOW ARE SYMPTOMS THAT SHOULD BE REPORTED IMMEDIATELY: *FEVER GREATER THAN 100.4 F (38 C) OR HIGHER *CHILLS OR SWEATING *NAUSEA AND VOMITING THAT IS NOT CONTROLLED WITH YOUR NAUSEA MEDICATION *UNUSUAL SHORTNESS OF BREATH *UNUSUAL BRUISING OR BLEEDING *URINARY PROBLEMS (pain or burning when urinating, or frequent urination) *BOWEL PROBLEMS (unusual diarrhea, constipation, pain near the anus) TENDERNESS IN MOUTH AND THROAT WITH OR WITHOUT PRESENCE OF ULCERS (sore throat, sores in mouth, or a toothache) UNUSUAL RASH, SWELLING OR PAIN  UNUSUAL VAGINAL DISCHARGE OR ITCHING   Items with * indicate a potential emergency and should be followed up as soon as possible or go to the Emergency Department if any problems should occur.  Please show the CHEMOTHERAPY ALERT CARD or IMMUNOTHERAPY ALERT CARD at check-in to the Emergency Department and triage nurse.  Should you have questions after your visit or need to cancel or reschedule your appointment, please contact [facility]  [phone]  and follow the prompts.  Office hours are 8:00 a.m. to 4:30 p.m. Monday - Friday. Please note that voicemails left after 4:00 p.m. may not be returned until the following business day.  We are closed weekends and major holidays. You have access to a nurse at all times for urgent questions. Please call the main number to the clinic [phone] and follow the prompts.  For any non-urgent questions, you may also contact your provider using MyChart. We now offer e-Visits for anyone 30 and older to request care online for non-urgent symptoms. For details visit [email or web address].   Also download the MyChart app! Go to the app store, search "MyChart", open the app, select [facility], and log in with your MyChart username and password.  Masks are optional in the cancer centers. If you would like for your care team to wear a mask while they are taking care of you, please let them know. For doctor visits, patients may have with them one support person who is at least 74 years old. At this time, visitors are not allowed in the infusion area.

## 2022-02-07 NOTE — Progress Notes (Signed)
Alexandra Price presented for Portacath access and flush with labs. Portacath located right chest wall accessed with  H 20 needle.  Good blood return present. Portacath flushed with 38m NS and 500U/553mHeparin and needle removed intact. No bruising or swelling noted at the site. Procedure tolerated well and without incident.     Discharged from clinic ambulatory in stable condition. Alert and oriented x 3. [facility].

## 2022-02-08 LAB — KAPPA/LAMBDA LIGHT CHAINS
Kappa free light chain: 42.4 mg/L — ABNORMAL HIGH (ref 3.3–19.4)
Kappa, lambda light chain ratio: 0.49 (ref 0.26–1.65)
Lambda free light chains: 86.4 mg/L — ABNORMAL HIGH (ref 5.7–26.3)

## 2022-02-09 LAB — PROTEIN ELECTROPHORESIS, SERUM
A/G Ratio: 1.3 (ref 0.7–1.7)
Albumin ELP: 3.5 g/dL (ref 2.9–4.4)
Alpha-1-Globulin: 0.2 g/dL (ref 0.0–0.4)
Alpha-2-Globulin: 0.9 g/dL (ref 0.4–1.0)
Beta Globulin: 0.8 g/dL (ref 0.7–1.3)
Gamma Globulin: 0.9 g/dL (ref 0.4–1.8)
Globulin, Total: 2.7 g/dL (ref 2.2–3.9)
M-Spike, %: 0.3 g/dL — ABNORMAL HIGH
Total Protein ELP: 6.2 g/dL (ref 6.0–8.5)

## 2022-02-12 ENCOUNTER — Other Ambulatory Visit (HOSPITAL_COMMUNITY): Payer: Self-pay

## 2022-02-12 ENCOUNTER — Encounter (HOSPITAL_COMMUNITY): Payer: Self-pay | Admitting: Hematology

## 2022-02-12 MED ORDER — PANTOPRAZOLE SODIUM 40 MG PO TBEC: 40.0000 mg | DELAYED_RELEASE_TABLET | Freq: Every day | ORAL | 6 refills | Status: DC

## 2022-02-13 LAB — IMMUNOFIXATION ELECTROPHORESIS
IgA: 91 mg/dL (ref 64–422)
IgG (Immunoglobin G), Serum: 977 mg/dL (ref 586–1602)
IgM (Immunoglobulin M), Srm: 34 mg/dL (ref 26–217)
Total Protein ELP: 6.3 g/dL (ref 6.0–8.5)

## 2022-02-14 ENCOUNTER — Inpatient Hospital Stay (HOSPITAL_BASED_OUTPATIENT_CLINIC_OR_DEPARTMENT_OTHER): Payer: Medicare Other | Admitting: Hematology

## 2022-02-14 VITALS — BP 145/77 | HR 59 | Temp 99.9°F | Resp 18 | Ht 61.02 in | Wt 175.0 lb

## 2022-02-14 DIAGNOSIS — C9001 Multiple myeloma in remission: Secondary | ICD-10-CM

## 2022-02-14 DIAGNOSIS — C9000 Multiple myeloma not having achieved remission: Secondary | ICD-10-CM | POA: Diagnosis not present

## 2022-02-14 NOTE — Progress Notes (Signed)
[facility] [address], [place] [address]   CLINIC:  Medical Oncology/Hematology  PCP:  [name], MD [address] [place] / [place] [address] [phone]   REASON FOR VISIT:  Follow-up for multiple myeloma  PRIOR THERAPY:  1. KPD x 4 cycles from 04/17/2018 to 08/21/2018. 2. Stem cell transplant on 10/23/2018. 3. Radiation to left and right humerus 25 Gy in 10 fractions from 08/23/2020 to 08/25/2020.  CURRENT THERAPY: Maintenance Pomalyst 3/4 weeks  BRIEF ONCOLOGIC HISTORY:  Oncology History  Multiple myeloma without remission ([place])  04/15/2018 Initial Diagnosis   Multiple myeloma without remission ([place])   04/17/2018 - 09/05/2018 Chemotherapy   The patient had dexamethasone (DECADRON) 4 MG tablet, 1 of 1 cycle, Start date: 04/15/2018, End date: 05/26/2018 palonosetron (ALOXI) injection 0.25 mg, 0.25 mg, Intravenous,  Once, 1 of 1 cycle Administration: 0.25 mg (04/17/2018), 0.25 mg (04/24/2018), 0.25 mg (05/01/2018) cyclophosphamide (CYTOXAN) 540 mg in sodium chloride 0.9 % 250 mL chemo infusion, 300 mg/m2 = 540 mg, Intravenous,  Once, 1 of 1 cycle Administration: 540 mg (04/17/2018), 540 mg (04/24/2018), 540 mg (05/01/2018) carfilzomib (KYPROLIS) 36 mg in dextrose 5 % 50 mL chemo infusion, 20 mg/m2 = 36 mg, Intravenous, Once, 5 of 5 cycles Administration: 36 mg (04/17/2018), 36 mg (04/18/2018), 60 mg (04/24/2018), 60 mg (04/25/2018), 60 mg (05/01/2018), 60 mg (05/02/2018), 60 mg (05/15/2018), 60 mg (05/16/2018), 60 mg (06/12/2018), 60 mg (06/13/2018), 60 mg (06/26/2018), 60 mg (06/27/2018), 60 mg (07/03/2018), 60 mg (07/04/2018), 60 mg (07/24/2018), 60 mg (07/25/2018), 60 mg (07/31/2018), 60 mg (08/01/2018), 60 mg (08/08/2018), 60 mg (08/07/2018), 60 mg (08/21/2018), 60 mg (08/22/2018), 60 mg (08/28/2018), 60 mg (08/29/2018), 60 mg (09/04/2018), 60 mg (09/05/2018)  for chemotherapy treatment.      CANCER STAGING: Cancer Staging  No matching staging information was found for the  patient.  INTERVAL HISTORY:  [name], a 76 y.o. female, returns for routine follow-up of her multiple myeloma. Alexandra Price was last seen on 12/20/2021.   Today she reports feeling well. She denies missing any doses of Pomalyst. Her left shoulder and back pain is stable. Her appetite is good. She reports tremors in her hands over the past 5 years.    REVIEW OF SYSTEMS:  Review of Systems  Constitutional:  Positive for fatigue. Negative for appetite change.  Respiratory:  Negative for cough and shortness of breath.   Cardiovascular:  Positive for chest pain (mid sternal). Negative for palpitations.  Gastrointestinal:  Positive for constipation and diarrhea. Negative for nausea and vomiting.  Genitourinary:  Positive for dysuria.   Musculoskeletal:  Positive for back pain (4/10 L side - stable).  Neurological:  Positive for dizziness, headaches and numbness (shins).  Psychiatric/Behavioral:  Positive for depression and sleep disturbance. The patient is nervous/anxious.   All other systems reviewed and are negative.   PAST MEDICAL/SURGICAL HISTORY:  Past Medical History:  Diagnosis Date   Anxiety    Depression    Hypertension    Multiple myeloma ([place])    multiple myeloma   Past Surgical History:  Procedure Laterality Date   ABDOMINAL HYSTERECTOMY     total   APPENDECTOMY     COLONOSCOPY WITH PROPOFOL N/A 12/25/2021   Procedure: COLONOSCOPY WITH PROPOFOL;  Surgeon: [name], DO;  Location: [facility];  Service: Endoscopy;  Laterality: N/A;  2:30pm   LAPAROSCOPIC APPENDECTOMY N/A 05/20/2018   Procedure: APPENDECTOMY LAPAROSCOPIC;  Surgeon: [name], MD;  Location: [facility] ORS;  Service: General;  Laterality: N/A;   POLYPECTOMY  12/25/2021   Procedure: POLYPECTOMY;  Surgeon: [name], DO;  Location: [facility];  Service: Endoscopy;;   PORTACATH PLACEMENT Right 04/14/2018   Procedure: INSERTION PORT-A-CATH;  Surgeon: [name], MD;  Location: [facility] ORS;   Service: General;  Laterality: Right;    SOCIAL HISTORY:  Social History   Socioeconomic History   Marital status: Legally Separated    Spouse name: Not on file   Number of children: 6   Years of education: Not on file   Highest education level: Not on file  Occupational History    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  Tobacco Use   Smoking status: Never   Smokeless tobacco: Never  Vaping Use   Vaping Use: Never used  Substance and Sexual Activity   Alcohol use: Never   Drug use: Not Currently   Sexual activity: Not Currently  Other Topics Concern   Not on file  Social History Narrative   Not on file   Social Determinants of Health   Financial Resource Strain: Low Risk  (07/06/2020)   Overall Financial Resource Strain (CARDIA)    Difficulty of Paying Living Expenses: Not hard at all  Food Insecurity: No Food Insecurity (07/06/2020)   Hunger Vital Sign    Worried About Running Out of Food in the Last Year: Never true    Ran Out of Food in the Last Year: Never true  Transportation Needs: No Transportation Needs (07/06/2020)   PRAPARE - Hydrologist (Medical): No    Lack of Transportation (Non-Medical): No  Physical Activity: Inactive (07/06/2020)   Exercise Vital Sign    Days of Exercise per Week: 0 days    Minutes of Exercise per Session: 0 min  Stress: No Stress Concern Present (07/06/2020)   [facility]    Feeling of Stress : Not at all  Social Connections: Moderately Isolated (07/06/2020)   Social Connection and Isolation Panel [NHANES]    Frequency of Communication with Friends and Family: More than three times a week    Frequency of Social Gatherings with Friends and Family: Twice a week    Attends Religious Services: 1 to 4 times per year    Active Member of [employer] or Organizations: No    Attends Archivist Meetings: Never    Marital Status: Separated   Intimate Partner Violence: Not At Risk (08/02/2020)   Humiliation, Afraid, Rape, and Kick questionnaire    Fear of Current or Ex-Partner: No    Emotionally Abused: No    Physically Abused: No    Sexually Abused: No    FAMILY HISTORY:  Family History  Problem Relation Age of Onset   Heart disease Mother    Emphysema Father    Diabetes Sister    Depression Sister    Cancer Brother        liver, lung, and colon   Diabetes Brother     CURRENT MEDICATIONS:  Current Outpatient Medications  Medication Sig Dispense Refill   acyclovir (ZOVIRAX) 400 MG tablet Take 1 tablet (400 mg total) by mouth 2 (two) times daily. 60 tablet 4   albuterol (VENTOLIN HFA) 108 (90 Base) MCG/ACT inhaler Inhale 2 puffs into the lungs in the morning, at noon, in the evening, and at bedtime. (Patient not taking: Reported on 12/20/2021) 8 g 0   ALPRAZolam (XANAX) 0.5 MG tablet Take one tablet during the day as  needed for anxiety and two tablets at bedtime as needed for anxiety/insomnia (Patient taking differently: See admin instructions. Take one tablet during the day as needed for anxiety and two tablets at bedtime as needed for anxiety/insomnia) 90 tablet 3   apixaban (ELIQUIS) 5 MG TABS tablet Take 5 mg by mouth 2 (two) times daily.     Calcium Carb-Cholecalciferol (CALCIUM 1000 + D PO) Take 1,000 mg by mouth daily. Take one tablet daily until next visit with oncologist.     escitalopram (LEXAPRO) 20 MG tablet Take 20 mg by mouth daily.     HYDROcodone-acetaminophen (NORCO) 7.5-325 MG tablet Take 1 tablet by mouth every 6 (six) hours as needed for moderate pain. 120 tablet 0   loperamide (IMODIUM) 2 MG capsule Take 1 capsule (2 mg total) by mouth as needed for diarrhea or loose stools. 30 capsule 0   pantoprazole (PROTONIX) 40 MG tablet Take 1 tablet (40 mg total) by mouth daily. 90 tablet 6   polyethylene glycol-electrolytes (NULYTELY) 420 g solution As directed 4000 mL 0   pomalidomide (POMALYST) 2 MG capsule  TAKE 1 CAPSULE BY MOUTH ONCE DAILY FOR 21 DAYS ON AND 7 DAYS OFF 21 capsule 0   rosuvastatin (CRESTOR) 10 MG tablet Take 10 mg by mouth daily.     zolpidem (AMBIEN) 10 MG tablet Take 1 tablet (10 mg total) by mouth at bedtime as needed for sleep. (Patient taking differently: Take 10 mg by mouth at bedtime.) 30 tablet 2   No current facility-administered medications for this visit.    ALLERGIES:  Allergies  Allergen Reactions   Ciprofloxacin Anaphylaxis   Amoxicillin Other (See Comments)    unknown   Morphine And Related Nausea And Vomiting   Augmentin [Amoxicillin-Pot Clavulanate] Other (See Comments)    Headache, insomnia    PHYSICAL EXAM:  Performance status (ECOG): 1 - Symptomatic but completely ambulatory  There were no vitals filed for this visit. Wt Readings from Last 3 Encounters:  12/25/21 179 lb 3.7 oz (81.3 kg)  12/20/21 179 lb 3.7 oz (81.3 kg)  12/13/21 181 lb 6.4 oz (82.3 kg)   Physical Exam Vitals reviewed.  Constitutional:      Appearance: Normal appearance. She is obese.  Cardiovascular:     Rate and Rhythm: Normal rate and regular rhythm.     Pulses: Normal pulses.     Heart sounds: Normal heart sounds.  Pulmonary:     Effort: Pulmonary effort is normal.     Breath sounds: Normal breath sounds.  Neurological:     General: No focal deficit present.     Mental Status: She is alert and oriented to person, place, and time.  Psychiatric:        Mood and Affect: Mood normal.        Behavior: Behavior normal.      LABORATORY DATA:  I have reviewed the labs as listed.     Latest Ref Rng & Units 02/07/2022    2:40 PM 12/14/2021   11:03 AM 09/19/2021   10:18 AM  CBC  WBC 4.0 - 10.5 K/uL 3.2  2.4  2.4   Hemoglobin 12.0 - 15.0 g/dL 9.7  9.9  9.3   Hematocrit 36.0 - 46.0 % 29.2  29.8  29.3   Platelets 150 - 400 K/uL 122  98  101       Latest Ref Rng & Units 02/07/2022    2:40 PM 12/14/2021   11:03 AM 09/19/2021   10:18 AM  CMP  Glucose 70 - 99 mg/dL 128   120  126   BUN 8 - 23 mg/dL _0 Creatinine 0.44 - 1.00 mg/dL 1.45  1.39  1.45   Sodium 135 - 145 mmol/L 136  140  139   Potassium 3.5 - 5.1 mmol/L 4.2  4.4  4.0   Chloride 98 - 111 mmol/L 102  107  106   CO2 22 - 32 mmol/L _1 Calcium 8.9 - 10.3 mg/dL 9.6  8.6  8.5   Total Protein 6.5 - 8.1 g/dL 6.9  6.6  6.6   Total Bilirubin 0.3 - 1.2 mg/dL 0.6  0.5  0.3   Alkaline Phos 38 - 126 U/L 69  61  52   AST 15 - 41 U/L _2 ALT 0 - 44 U/L _3 DIAGNOSTIC IMAGING:  I have independently reviewed the scans and discussed with the patient. No results found.   ASSESSMENT:  1.  IgG lambda plasma cell myeloma, stage II, standard risk: -4 cycles of KPD from 04/17/2018 through 08/21/2018, stem cell transplant on 10/23/2018. -PET scan on 01/21/2019 showed multiple bone lesions but without any hypermetabolic activity. -BMBX on 01/21/2019 with normocellular marrow with no increase in plasma cells.  Normal FISH.  MRD results negative. -Maintenance pomalidomide 2 mg 3 weeks on/1 week off started on 03/10/2019. -Bone marrow biopsy on 10/29/2019 shows trilineage hematopoiesis with no evidence of plasma cells.  Chromosome analysis and FISH are normal. -BM BX on 10/27/2020 at [facility] with 30 to 40%, TLH.  2% of the total cells are CD 138+ plasma cells. - Pomalyst was held due to dizziness from 03/29/2021 through 04/26/2021. - MRI of the brain on 03/31/2021 did not show any evidence of intracranial lesions.  Multiple bone lesions in the skull compatible with sequela of myeloma.   2.  Pulmonary embolism: -CT angiogram on 04/21/2020 showed filling defect at [facility]. -VQ scan confirmed pulmonary embolism. -She is on Eliquis.   3.  Right shoulder and upper arm pain: - She received XRT to the chest from 12/26/2020 through 01/06/2021.   PLAN:  1.  IgG lambda plasma cell myeloma: - Labs at [facility] on 11/16/2021: M spike 0.24 g.  Immunofixation positive for  IgG lambda. - PET scan on 12/14/2021: No evidence of FDG avid osseous or soft tissue myeloma. - She has developed left shoulder blade and right posterior rib pain for the last 3 to 4 months. - 24-hour urine on 12/14/2021: Total protein 136 mg.  Immune immunofixation positive for lambda type Bence-Jones protein. - She is tolerating Pomalyst 2 mg 3 weeks on/1 week off very well. - Reviewed labs from 02/07/2022: Creatinine 1.45 and stable and calcium normal.  M spike has resurfaced at 0.3 g.  Previously it was not observed.  Lambda light chains increased to 86 from 50 previously.  Ratio is stable at 0.49.  Immunofixation shows IgG lambda. - She is starting to have relapse of myeloma. - I have recommended continuing Pomalyst at the same dose at this time.  She has more cytopenias with higher dose. - If M spike reaches more than 0.5 g, will consider switching therapy.  Will consider daratumumab, Velcade and dexamethasone based regimen. - Recommend follow-up in 6 weeks with repeat myeloma labs. - She reports shaking of her hands when she tries to hold objects.  She had it for the last few years.  No worsening.  Will monitor.   2.  Back pain/left shoulder blade pain/right lateral and posterior rib pains: - Continue hydrocodone 7.5/325 4 times daily.  Pain is stable.   3.  Myeloma bone disease: - Denosumab held due to exposure of the bone in the right lower jaw.  Continue calcium and vitamin D supplements.   4.  Anxiety: - Continue Xanax twice daily as needed.   5.  Sleeping difficulty: - Continue Ambien at bedtime as needed.   6.  Macrocytic anemia: - Combination anemia from CKD and myelosuppression from Pomalyst.  Hemoglobin is stable between 9-10.  Last ferritin, P50 and folic acid were normal.   7.  Pulmonary embolism: - Continue Eliquis.  No bleeding issues reported.   Orders placed this encounter:  No orders of the defined types were placed in this encounter.    [name],  MD [facility] [phone]   I, Thana Ates, am acting as a scribe for Dr. [name].  I, [name] MD, have reviewed the above documentation for accuracy and completeness, and I agree with the above.

## 2022-02-14 NOTE — Patient Instructions (Signed)
[place] at [facility] Discharge Instructions  You were seen and examined today by Dr. Delton Coombes.  Dr. Delton Coombes discussed your most recent lab work and your m-spike has went up. Continue taking the Pomalyst as prescribed three weeks on one week off. If your m-spike continues to rise we will discuss change in treatment at that time.  Follow-up as scheduled in 6 weeks.    Thank you for choosing [place] at [facility] to provide your oncology and hematology care.  To afford each patient quality time with our provider, please arrive at least 15 minutes before your scheduled appointment time.   If you have a lab appointment with the [place] please come in thru the Main Entrance and check in at the main information desk.  You need to re-schedule your appointment should you arrive 10 or more minutes late.  We strive to give you quality time with our providers, and arriving late affects you and other patients whose appointments are after yours.  Also, if you no show three or more times for appointments you may be dismissed from the clinic at the providers discretion.     Again, thank you for choosing [facility].  Our hope is that these requests will decrease the amount of time that you wait before being seen by our physicians.       _____________________________________________________________  Should you have questions after your visit to [facility], please contact our office at [phone] and follow the prompts.  Our office hours are 8:00 a.m. and 4:30 p.m. Monday - Friday.  Please note that voicemails left after 4:00 p.m. may not be returned until the following business day.  We are closed weekends and major holidays.  You do have access to a nurse 24-7, just call the main number to the clinic [phone] and do not press any options, hold on the line and a nurse will answer the phone.    For prescription  refill requests, have your pharmacy contact our office and allow 72 hours.    Due to Covid, you will need to wear a mask upon entering the hospital. If you do not have a mask, a mask will be given to you at the Main Entrance upon arrival. For doctor visits, patients may have 1 support person age 73 or older with them. For treatment visits, patients can not have anyone with them due to social distancing guidelines and our immunocompromised population.

## 2022-02-14 NOTE — Progress Notes (Signed)
Patient is taking Pomalyst as prescribed.  She has not missed any doses and reports no side effects at this time.   

## 2022-02-15 ENCOUNTER — Other Ambulatory Visit (HOSPITAL_COMMUNITY): Payer: Self-pay

## 2022-02-15 DIAGNOSIS — C9000 Multiple myeloma not having achieved remission: Secondary | ICD-10-CM

## 2022-02-15 MED ORDER — ACYCLOVIR 400 MG PO TABS: 400.0000 mg | ORAL_TABLET | Freq: Two times a day (BID) | ORAL | 6 refills | Status: DC

## 2022-02-21 ENCOUNTER — Other Ambulatory Visit (HOSPITAL_COMMUNITY): Payer: Self-pay

## 2022-02-21 DIAGNOSIS — G479 Sleep disorder, unspecified: Secondary | ICD-10-CM

## 2022-02-21 MED ORDER — ZOLPIDEM TARTRATE 10 MG PO TABS: 10.0000 mg | ORAL_TABLET | Freq: Every evening | ORAL | 2 refills | Status: DC | PRN

## 2022-02-21 MED ORDER — HYDROCODONE-ACETAMINOPHEN 7.5-325 MG PO TABS: 1.0000 | ORAL_TABLET | Freq: Four times a day (QID) | ORAL | 0 refills | Status: DC | PRN

## 2022-03-01 ENCOUNTER — Other Ambulatory Visit (HOSPITAL_COMMUNITY): Payer: Self-pay

## 2022-03-01 ENCOUNTER — Other Ambulatory Visit (HOSPITAL_COMMUNITY): Payer: Self-pay | Admitting: Hematology

## 2022-03-01 DIAGNOSIS — C9000 Multiple myeloma not having achieved remission: Secondary | ICD-10-CM

## 2022-03-01 MED ORDER — POMALIDOMIDE 2 MG PO CAPS: ORAL_CAPSULE | ORAL | 0 refills | Status: DC

## 2022-03-01 NOTE — Telephone Encounter (Signed)
Chart reviewed. Pomalyst refilled per last office note with Dr. Katragadda.  

## 2022-03-05 ENCOUNTER — Encounter (HOSPITAL_COMMUNITY): Payer: Self-pay | Admitting: Hematology

## 2022-03-20 ENCOUNTER — Inpatient Hospital Stay: Payer: Medicare Other | Attending: Hematology

## 2022-03-20 DIAGNOSIS — Z7901 Long term (current) use of anticoagulants: Secondary | ICD-10-CM | POA: Insufficient documentation

## 2022-03-20 DIAGNOSIS — Z86711 Personal history of pulmonary embolism: Secondary | ICD-10-CM | POA: Insufficient documentation

## 2022-03-20 DIAGNOSIS — R0781 Pleurodynia: Secondary | ICD-10-CM | POA: Insufficient documentation

## 2022-03-20 DIAGNOSIS — K5900 Constipation, unspecified: Secondary | ICD-10-CM | POA: Diagnosis not present

## 2022-03-20 DIAGNOSIS — F419 Anxiety disorder, unspecified: Secondary | ICD-10-CM | POA: Diagnosis not present

## 2022-03-20 DIAGNOSIS — Z452 Encounter for adjustment and management of vascular access device: Secondary | ICD-10-CM | POA: Diagnosis not present

## 2022-03-20 DIAGNOSIS — N189 Chronic kidney disease, unspecified: Secondary | ICD-10-CM | POA: Diagnosis not present

## 2022-03-20 DIAGNOSIS — D631 Anemia in chronic kidney disease: Secondary | ICD-10-CM | POA: Diagnosis not present

## 2022-03-20 DIAGNOSIS — C9000 Multiple myeloma not having achieved remission: Secondary | ICD-10-CM | POA: Insufficient documentation

## 2022-03-20 DIAGNOSIS — C9001 Multiple myeloma in remission: Secondary | ICD-10-CM

## 2022-03-20 LAB — CBC WITH DIFFERENTIAL/PLATELET
Abs Immature Granulocytes: 0 10*3/uL (ref 0.00–0.07)
Basophils Absolute: 0 10*3/uL (ref 0.0–0.1)
Basophils Relative: 2 %
Eosinophils Absolute: 0.1 10*3/uL (ref 0.0–0.5)
Eosinophils Relative: 5 %
HCT: 32.1 % — ABNORMAL LOW (ref 36.0–46.0)
Hemoglobin: 10.6 g/dL — ABNORMAL LOW (ref 12.0–15.0)
Immature Granulocytes: 0 %
Lymphocytes Relative: 34 %
Lymphs Abs: 0.9 10*3/uL (ref 0.7–4.0)
MCH: 34 pg (ref 26.0–34.0)
MCHC: 33 g/dL (ref 30.0–36.0)
MCV: 102.9 fL — ABNORMAL HIGH (ref 80.0–100.0)
Monocytes Absolute: 0.3 10*3/uL (ref 0.1–1.0)
Monocytes Relative: 13 %
Neutro Abs: 1.2 10*3/uL — ABNORMAL LOW (ref 1.7–7.7)
Neutrophils Relative %: 46 %
Platelets: 125 10*3/uL — ABNORMAL LOW (ref 150–400)
RBC: 3.12 MIL/uL — ABNORMAL LOW (ref 3.87–5.11)
RDW: 13 % (ref 11.5–15.5)
WBC: 2.6 10*3/uL — ABNORMAL LOW (ref 4.0–10.5)
nRBC: 0 % (ref 0.0–0.2)

## 2022-03-20 LAB — COMPREHENSIVE METABOLIC PANEL
ALT: 31 U/L (ref 0–44)
AST: 26 U/L (ref 15–41)
Albumin: 4.1 g/dL (ref 3.5–5.0)
Alkaline Phosphatase: 66 U/L (ref 38–126)
Anion gap: 7 (ref 5–15)
BUN: 25 mg/dL — ABNORMAL HIGH (ref 8–23)
CO2: 27 mmol/L (ref 22–32)
Calcium: 9 mg/dL (ref 8.9–10.3)
Chloride: 104 mmol/L (ref 98–111)
Creatinine, Ser: 1.55 mg/dL — ABNORMAL HIGH (ref 0.44–1.00)
GFR, Estimated: 35 mL/min — ABNORMAL LOW (ref >60)
Glucose, Bld: 164 mg/dL — ABNORMAL HIGH (ref 70–99)
Potassium: 4.1 mmol/L (ref 3.5–5.1)
Sodium: 138 mmol/L (ref 135–145)
Total Bilirubin: 0.8 mg/dL (ref 0.3–1.2)
Total Protein: 7.1 g/dL (ref 6.5–8.1)

## 2022-03-20 LAB — LACTATE DEHYDROGENASE: LDH: 135 U/L (ref 98–192)

## 2022-03-20 MED ORDER — SODIUM CHLORIDE 0.9% FLUSH
10.0000 mL | Freq: Once | INTRAVENOUS | Status: AC
  Administered 2022-03-20: 10 mL via INTRAVENOUS

## 2022-03-20 MED ORDER — HEPARIN SOD (PORK) LOCK FLUSH 100 UNIT/ML IV SOLN
500.0000 [IU] | Freq: Once | INTRAVENOUS | Status: AC
  Administered 2022-03-20: 500 [IU] via INTRAVENOUS

## 2022-03-20 NOTE — Patient Instructions (Signed)
[facility] [place]  Discharge Instructions: Thank you for choosing [facility] to provide your oncology and hematology care.  If you have a lab appointment with the Cancer Center, please come in thru the Main Entrance and check in at the main information desk.  Wear comfortable clothing and clothing appropriate for easy access to any Portacath or PICC line.   We strive to give you quality time with your provider. You may need to reschedule your appointment if you arrive late (15 or more minutes).  Arriving late affects you and other patients whose appointments are after yours.  Also, if you miss three or more appointments without notifying the office, you may be dismissed from the clinic at the provider's discretion.      For prescription refill requests, have your pharmacy contact our office and allow 72 hours for refills to be completed.      To help prevent nausea and vomiting after your treatment, we encourage you to take your nausea medication as directed.  BELOW ARE SYMPTOMS THAT SHOULD BE REPORTED IMMEDIATELY: *FEVER GREATER THAN 100.4 F (38 C) OR HIGHER *CHILLS OR SWEATING *NAUSEA AND VOMITING THAT IS NOT CONTROLLED WITH YOUR NAUSEA MEDICATION *UNUSUAL SHORTNESS OF BREATH *UNUSUAL BRUISING OR BLEEDING *URINARY PROBLEMS (pain or burning when urinating, or frequent urination) *BOWEL PROBLEMS (unusual diarrhea, constipation, pain near the anus) TENDERNESS IN MOUTH AND THROAT WITH OR WITHOUT PRESENCE OF ULCERS (sore throat, sores in mouth, or a toothache) UNUSUAL RASH, SWELLING OR PAIN  UNUSUAL VAGINAL DISCHARGE OR ITCHING   Items with * indicate a potential emergency and should be followed up as soon as possible or go to the Emergency Department if any problems should occur.  Please show the CHEMOTHERAPY ALERT CARD or IMMUNOTHERAPY ALERT CARD at check-in to the Emergency Department and triage nurse.  Should you have questions after your visit or need to  cancel or reschedule your appointment, please contact [facility] [place] [phone]  and follow the prompts.  Office hours are 8:00 a.m. to 4:30 p.m. Monday - Friday. Please note that voicemails left after 4:00 p.m. may not be returned until the following business day.  We are closed weekends and major holidays. You have access to a nurse at all times for urgent questions. Please call the main number to the clinic [phone] and follow the prompts.  For any non-urgent questions, you may also contact your provider using MyChart. We now offer e-Visits for anyone 18 and older to request care online for non-urgent symptoms. For details visit [email or web address].   Also download the MyChart app! Go to the app store, search "MyChart", open the app, select [place], and log in with your MyChart username and password.  Masks are optional in the cancer centers. If you would like for your care team to wear a mask while they are taking care of you, please let them know. For doctor visits, patients may have with them one support person who is at least 74 years old. At this time, visitors are not allowed in the infusion area.  

## 2022-03-21 ENCOUNTER — Other Ambulatory Visit (HOSPITAL_COMMUNITY): Payer: Medicare Other

## 2022-03-21 ENCOUNTER — Other Ambulatory Visit: Payer: Self-pay

## 2022-03-21 LAB — KAPPA/LAMBDA LIGHT CHAINS
Kappa free light chain: 38.1 mg/L — ABNORMAL HIGH (ref 3.3–19.4)
Kappa, lambda light chain ratio: 0.35 (ref 0.26–1.65)
Lambda free light chains: 108.6 mg/L — ABNORMAL HIGH (ref 5.7–26.3)

## 2022-03-21 MED ORDER — HYDROCODONE-ACETAMINOPHEN 7.5-325 MG PO TABS: 1.0000 | ORAL_TABLET | Freq: Four times a day (QID) | ORAL | 0 refills | Status: DC | PRN

## 2022-03-23 LAB — PROTEIN ELECTROPHORESIS, SERUM
A/G Ratio: 1.3 (ref 0.7–1.7)
Albumin ELP: 3.9 g/dL (ref 2.9–4.4)
Alpha-1-Globulin: 0.2 g/dL (ref 0.0–0.4)
Alpha-2-Globulin: 0.9 g/dL (ref 0.4–1.0)
Beta Globulin: 0.8 g/dL (ref 0.7–1.3)
Gamma Globulin: 1 g/dL (ref 0.4–1.8)
Globulin, Total: 2.9 g/dL (ref 2.2–3.9)
M-Spike, %: 0.4 g/dL — ABNORMAL HIGH
Total Protein ELP: 6.8 g/dL (ref 6.0–8.5)

## 2022-03-23 LAB — IMMUNOFIXATION ELECTROPHORESIS
IgA: 89 mg/dL (ref 64–422)
IgG (Immunoglobin G), Serum: 1072 mg/dL (ref 586–1602)
IgM (Immunoglobulin M), Srm: 25 mg/dL — ABNORMAL LOW (ref 26–217)
Total Protein ELP: 6.6 g/dL (ref 6.0–8.5)

## 2022-03-28 ENCOUNTER — Inpatient Hospital Stay (HOSPITAL_BASED_OUTPATIENT_CLINIC_OR_DEPARTMENT_OTHER): Payer: Medicare Other | Admitting: Hematology

## 2022-03-28 VITALS — BP 136/77 | HR 65 | Temp 98.0°F | Resp 18 | Ht 61.0 in | Wt 178.6 lb

## 2022-03-28 DIAGNOSIS — C9000 Multiple myeloma not having achieved remission: Secondary | ICD-10-CM | POA: Diagnosis not present

## 2022-03-28 NOTE — Patient Instructions (Addendum)
[place] at [facility] Discharge Instructions  You were seen and examined today by Dr. Delton Coombes.  Dr. Delton Coombes discussed your most recent lab work and your m-spike continues to rise it has went from 0.2 to 0.4 in 5 months. Dr. Delton Coombes recommends repeat labs and scan before starting different treatment. If it continues to rise to 0.5 or greater he will switch treatment at that time.   Continue taking Pomalyst as prescribed.  Follow-up as scheduled.    Thank you for choosing [place] at [facility] to provide your oncology and hematology care.  To afford each patient quality time with our provider, please arrive at least 15 minutes before your scheduled appointment time.   If you have a lab appointment with the [facility] please come in thru the Main Entrance and check in at the main information desk.  You need to re-schedule your appointment should you arrive 10 or more minutes late.  We strive to give you quality time with our providers, and arriving late affects you and other patients whose appointments are after yours.  Also, if you no show three or more times for appointments you may be dismissed from the clinic at the providers discretion.     Again, thank you for choosing [facility].  Our hope is that these requests will decrease the amount of time that you wait before being seen by our physicians.       _____________________________________________________________  Should you have questions after your visit to [facility], please contact our office at [phone] and follow the prompts.  Our office hours are 8:00 a.m. and 4:30 p.m. Monday - Friday.  Please note that voicemails left after 4:00 p.m. may not be returned until the following business day.  We are closed weekends and major holidays.  You do have access to a nurse 24-7, just call the main number to the clinic [phone] and do not press  any options, hold on the line and a nurse will answer the phone.    For prescription refill requests, have your pharmacy contact our office and allow 72 hours.

## 2022-03-29 ENCOUNTER — Encounter (HOSPITAL_COMMUNITY): Payer: Self-pay | Admitting: Hematology

## 2022-03-29 NOTE — Progress Notes (Signed)
Alexandra Price [address],  [address]   CLINIC:  Medical Oncology/Hematology  PCP:  [name], MD [address] [place] / [place] [address] [phone]   REASON FOR VISIT:  Follow-up for multiple myeloma  PRIOR THERAPY:  1. KPD x 4 cycles from 04/17/2018 to 08/21/2018. 2. Stem cell transplant on 10/23/2018. 3. Radiation to left and right humerus 25 Gy in 10 fractions from 08/23/2020 to 08/25/2020.  CURRENT THERAPY: Maintenance Pomalyst 3/4 weeks  BRIEF ONCOLOGIC HISTORY:  Oncology History  Multiple myeloma without remission ([place])  04/15/2018 Initial Diagnosis   Multiple myeloma without remission ([place])   04/17/2018 - 09/05/2018 Chemotherapy   The patient had dexamethasone (DECADRON) 4 MG tablet, 1 of 1 cycle, Start date: 04/15/2018, End date: 05/26/2018 palonosetron (ALOXI) injection 0.25 mg, 0.25 mg, Intravenous,  Once, 1 of 1 cycle Administration: 0.25 mg (04/17/2018), 0.25 mg (04/24/2018), 0.25 mg (05/01/2018) cyclophosphamide (CYTOXAN) 540 mg in sodium chloride 0.9 % 250 mL chemo infusion, 300 mg/m2 = 540 mg, Intravenous,  Once, 1 of 1 cycle Administration: 540 mg (04/17/2018), 540 mg (04/24/2018), 540 mg (05/01/2018) carfilzomib (KYPROLIS) 36 mg in dextrose 5 % 50 mL chemo infusion, 20 mg/m2 = 36 mg, Intravenous, Once, 5 of 5 cycles Administration: 36 mg (04/17/2018), 36 mg (04/18/2018), 60 mg (04/24/2018), 60 mg (04/25/2018), 60 mg (05/01/2018), 60 mg (05/02/2018), 60 mg (05/15/2018), 60 mg (05/16/2018), 60 mg (06/12/2018), 60 mg (06/13/2018), 60 mg (06/26/2018), 60 mg (06/27/2018), 60 mg (07/03/2018), 60 mg (07/04/2018), 60 mg (07/24/2018), 60 mg (07/25/2018), 60 mg (07/31/2018), 60 mg (08/01/2018), 60 mg (08/08/2018), 60 mg (08/07/2018), 60 mg (08/21/2018), 60 mg (08/22/2018), 60 mg (08/28/2018), 60 mg (08/29/2018), 60 mg (09/04/2018), 60 mg (09/05/2018)  for chemotherapy treatment.      CANCER STAGING:  Cancer Staging  No matching staging information was found for the  patient.  INTERVAL HISTORY:  Alexandra Price, a 76 y.o. female, returns for follow-up of multiple myeloma.  She complains of stable pain in the posterior ribs and shoulder blade.  She is tolerating pomalidomide very well.  She has some chronic diarrhea alternating with constipation stable.  No infections noted.  REVIEW OF SYSTEMS:  Review of Systems  Constitutional:  Negative for appetite change and fatigue.  Respiratory:  Positive for shortness of breath. Negative for cough.   Cardiovascular:  Negative for chest pain and palpitations.  Gastrointestinal:  Positive for constipation and diarrhea. Negative for nausea and vomiting.  Genitourinary:  Negative for dysuria.   Musculoskeletal:  Positive for back pain (4/10 L side - stable).  Neurological:  Positive for numbness (shins). Negative for dizziness and headaches.  Psychiatric/Behavioral:  Positive for depression. Negative for sleep disturbance. The patient is nervous/anxious.   All other systems reviewed and are negative.   PAST MEDICAL/SURGICAL HISTORY:  Past Medical History:  Diagnosis Date   Anxiety    Depression    Hypertension    Multiple myeloma ([place])    multiple myeloma   Past Surgical History:  Procedure Laterality Date   ABDOMINAL HYSTERECTOMY     total   APPENDECTOMY     COLONOSCOPY WITH PROPOFOL N/A 12/25/2021   Procedure: COLONOSCOPY WITH PROPOFOL;  Surgeon: [name], DO;  [facility];  Service: Endoscopy;  Laterality: N/A;  2:30pm   LAPAROSCOPIC APPENDECTOMY N/A 05/20/2018   Procedure: APPENDECTOMY LAPAROSCOPIC;  Surgeon: [name], MD;  Location: [facility] ORS;  Service: General;  Laterality: N/A;   POLYPECTOMY  12/25/2021  Procedure: POLYPECTOMY;  Surgeon: [name], DO;  Location: [facility];  Service: Endoscopy;;   PORTACATH PLACEMENT Right 04/14/2018   Procedure: INSERTION PORT-A-CATH;  Surgeon: [name], MD;  Location: [facility] ORS;  Service: General;  Laterality: Right;     SOCIAL HISTORY:  Social History   Socioeconomic History   Marital status: Legally Separated    Spouse name: Not on file   Number of children: 6   Years of education: Not on file   Highest education level: Not on file  Occupational History    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  Tobacco Use   Smoking status: Never   Smokeless tobacco: Never  Vaping Use   Vaping Use: Never used  Substance and Sexual Activity   Alcohol use: Never   Drug use: Not Currently   Sexual activity: Not Currently  Other Topics Concern   Not on file  Social History Narrative   Not on file   Social Determinants of Health   Financial Resource Strain: Low Risk  (07/06/2020)   Overall Financial Resource Strain (CARDIA)    Difficulty of Paying Living Expenses: Not hard at all  Food Insecurity: No Food Insecurity (07/06/2020)   Hunger Vital Sign    Worried About Running Out of Food in the Last Year: Never true    Ran Out of Food in the Last Year: Never true  Transportation Needs: No Transportation Needs (07/06/2020)   PRAPARE - Hydrologist (Medical): No    Lack of Transportation (Non-Medical): No  Physical Activity: Inactive (07/06/2020)   Exercise Vital Sign    Days of Exercise per Week: 0 days    Minutes of Exercise per Session: 0 min  Stress: No Stress Concern Present (07/06/2020)   [place]    Feeling of Stress : Not at all  Social Connections: Moderately Isolated (07/06/2020)   Social Connection and Isolation Panel [NHANES]    Frequency of Communication with Friends and Family: More than three times a week    Frequency of Social Gatherings with Friends and Family: Twice a week    Attends Religious Services: 1 to 4 times per year    Active Member of [employer] or Organizations: No    Attends Archivist Meetings: Never    Marital Status: Separated  Intimate Partner Violence: Not At Risk  (08/02/2020)   Humiliation, Afraid, Rape, and Kick questionnaire    Fear of Current or Ex-Partner: No    Emotionally Abused: No    Physically Abused: No    Sexually Abused: No    FAMILY HISTORY:  Family History  Problem Relation Age of Onset   Heart disease Mother    Emphysema Father    Diabetes Sister    Depression Sister    Cancer Brother        liver, lung, and colon   Diabetes Brother     CURRENT MEDICATIONS:  Current Outpatient Medications  Medication Sig Dispense Refill   acyclovir (ZOVIRAX) 400 MG tablet Take 1 tablet (400 mg total) by mouth 2 (two) times daily. 60 tablet 6   albuterol (VENTOLIN HFA) 108 (90 Base) MCG/ACT inhaler Inhale 2 puffs into the lungs in the morning, at noon, in the evening, and at bedtime. 8 g 0   ALPRAZolam (XANAX) 0.5 MG tablet Take one tablet during the day as needed for anxiety and two tablets at bedtime as needed for anxiety/insomnia (Patient taking differently: See admin instructions.  Take one tablet during the day as needed for anxiety and two tablets at bedtime as needed for anxiety/insomnia) 90 tablet 3   apixaban (ELIQUIS) 5 MG TABS tablet Take 5 mg by mouth 2 (two) times daily.     Calcium Carb-Cholecalciferol (CALCIUM 1000 + D PO) Take 1,000 mg by mouth daily. Take one tablet daily until next visit with oncologist.     escitalopram (LEXAPRO) 20 MG tablet Take 20 mg by mouth daily.     HYDROcodone-acetaminophen (NORCO) 7.5-325 MG tablet Take 1 tablet by mouth every 6 (six) hours as needed for moderate pain. 120 tablet 0   loperamide (IMODIUM) 2 MG capsule Take 1 capsule (2 mg total) by mouth as needed for diarrhea or loose stools. 30 capsule 0   pantoprazole (PROTONIX) 40 MG tablet Take 1 tablet (40 mg total) by mouth daily. 90 tablet 6   polyethylene glycol-electrolytes (NULYTELY) 420 g solution As directed 4000 mL 0   POMALYST 2 MG capsule TAKE 1 CAPSULE BY MOUTH ONCE DAILY FOR 21 DAYS ON AND 7 DAYS OFF 21 capsule 0    sulfamethoxazole-trimethoprim (BACTRIM DS) 800-160 MG tablet Take 1 tablet by mouth 2 (two) times daily.     zolpidem (AMBIEN) 10 MG tablet Take 1 tablet (10 mg total) by mouth at bedtime as needed for sleep. 30 tablet 2   clotrimazole-betamethasone (LOTRISONE) cream APPLY CREAM EXTERNALY TWICE A DAY FOR 30 DAYS     rosuvastatin (CRESTOR) 10 MG tablet Take 10 mg by mouth daily.     No current facility-administered medications for this visit.    ALLERGIES:  Allergies  Allergen Reactions   Ciprofloxacin Anaphylaxis   Amoxicillin Other (See Comments)    unknown   Morphine And Related Nausea And Vomiting   Augmentin [Amoxicillin-Pot Clavulanate] Other (See Comments)    Headache, insomnia    PHYSICAL EXAM:  Performance status (ECOG): 1 - Symptomatic but completely ambulatory  Vitals:   03/28/22 1458  BP: 136/77  Pulse: 65  Resp: 18  Temp: 98 F (36.7 C)  SpO2: 98%   Wt Readings from Last 3 Encounters:  03/28/22 178 lb 9.6 oz (81 kg)  02/14/22 175 lb 0.7 oz (79.4 kg)  12/25/21 179 lb 3.7 oz (81.3 kg)   Physical Exam Vitals reviewed.  Constitutional:      Appearance: Normal appearance. She is obese.  Cardiovascular:     Rate and Rhythm: Normal rate and regular rhythm.     Pulses: Normal pulses.     Heart sounds: Normal heart sounds.  Pulmonary:     Effort: Pulmonary effort is normal.     Breath sounds: Normal breath sounds.  Neurological:     General: No focal deficit present.     Mental Status: She is alert and oriented to person, place, and time.  Psychiatric:        Mood and Affect: Mood normal.        Behavior: Behavior normal.      LABORATORY DATA:  I have reviewed the labs as listed.     Latest Ref Rng & Units 03/20/2022    9:42 AM 02/07/2022    2:40 PM 12/14/2021   11:03 AM  CBC  WBC 4.0 - 10.5 K/uL 2.6  3.2  2.4   Hemoglobin 12.0 - 15.0 g/dL 10.6  9.7  9.9   Hematocrit 36.0 - 46.0 % 32.1  29.2  29.8   Platelets 150 - 400 K/uL 125  122  98  Latest Ref Rng & Units 03/20/2022    9:42 AM 02/07/2022    2:40 PM 12/14/2021   11:03 AM  CMP  Glucose 70 - 99 mg/dL 164  128  120   BUN 8 - 23 mg/dL 25  22  30    Creatinine 0.44 - 1.00 mg/dL 1.55  1.45  1.39   Sodium 135 - 145 mmol/L 138  136  140   Potassium 3.5 - 5.1 mmol/L 4.1  4.2  4.4   Chloride 98 - 111 mmol/L 104  102  107   CO2 22 - 32 mmol/L 27  29  29    Calcium 8.9 - 10.3 mg/dL 9.0  9.6  8.6   Total Protein 6.5 - 8.1 g/dL 7.1  6.9  6.6   Total Bilirubin 0.3 - 1.2 mg/dL 0.8  0.6  0.5   Alkaline Phos 38 - 126 U/L 66  69  61   AST 15 - 41 U/L 26  21  19    ALT 0 - 44 U/L 31  22  19      DIAGNOSTIC IMAGING:  I have independently reviewed the scans and discussed with the patient. No results found.   ASSESSMENT:  1.  IgG lambda plasma cell myeloma, stage II, standard risk: -4 cycles of KPD from 04/17/2018 through 08/21/2018, stem cell transplant on 10/23/2018. -PET scan on 01/21/2019 showed multiple bone lesions but without any hypermetabolic activity. -BMBX on 01/21/2019 with normocellular marrow with no increase in plasma cells.  Normal Alexandra.  MRD results negative. -Maintenance pomalidomide 2 mg 3 weeks on/1 week off started on 03/10/2019. -Bone marrow biopsy on 10/29/2019 shows trilineage hematopoiesis with no evidence of plasma cells.  Chromosome analysis and Alexandra are normal. -BM BX on 10/27/2020 at [place] with 30 to 40%, TLH.  2% of the total cells are CD 138+ plasma cells. - Pomalyst was held due to dizziness from 03/29/2021 through 04/26/2021. - MRI of the brain on 03/31/2021 did not show any evidence of intracranial lesions.  Multiple bone lesions in the skull compatible with sequela of myeloma.  - Labs at [facility] on 11/16/2021: M spike 0.24 g.  Immunofixation positive for IgG lambda. - PET scan on 12/14/2021: No evidence of FDG avid osseous or soft tissue myeloma. - She has developed left shoulder blade and right posterior rib pain for the last 3 to 4 months. -  24-hour urine on 12/14/2021: Total protein 136 mg.  Immune immunofixation positive for lambda type Bence-Jones protein.  2.  Pulmonary embolism: -CT angiogram on 04/21/2020 showed filling defect at [facility]. -VQ scan confirmed pulmonary embolism. -She is on Eliquis.   3.  Right shoulder and upper arm pain: - She received XRT to the chest from 12/26/2020 through 01/06/2021.   PLAN:  1.  IgG lambda plasma cell myeloma: - She is tolerating Pomalyst 2 mg 3 weeks on/1 week off very well. - Reviewed myeloma labs from 03/20/2022.  M spike is 0.4, up from 0.3.  Lambda light chains are 108, up from 86.  Immunofixation shows IgG lambda. - Creatinine is 1.57 calcium normal.  Hemoglobin 10.6. - Continue Pomalyst at this time.  Once M spike is more than 0.5 g, will consider switching therapy to daratumumab, Velcade and dexamethasone regimen. - RTC 2 months with repeat myeloma labs and PET scan.   2.  Back pain/left shoulder blade pain/right lateral and posterior rib pains: - Continue hydrocodone 7.5/325 4 times daily.   3.  Myeloma bone  disease: - Denosumab held due to exposure of the bone in the right lower jaw.  Continue calcium and vitamin D supplements.   4.  Anxiety: - Continue Xanax twice daily as needed.   5.  Sleeping difficulty: - Continue Ambien at bedtime as needed.   6.  Macrocytic anemia: - Combination anemia from CKD and myelosuppression from Pomalyst.   7.  Pulmonary embolism: - Continue Eliquis.  No bleeding issues.   Orders placed this encounter:  Orders Placed This Encounter  Procedures   NM PET Image Restage (PS) Whole Body   Protein electrophoresis, serum   Kappa/lambda light chains   Immunofixation electrophoresis   Lactate dehydrogenase   CBC with Differential/Platelet   Comprehensive metabolic panel   Ferritin   Iron and TIBC      [name], MD [place] [phone]

## 2022-04-10 ENCOUNTER — Other Ambulatory Visit (HOSPITAL_COMMUNITY): Payer: Self-pay | Admitting: Hematology

## 2022-04-10 ENCOUNTER — Ambulatory Visit: Payer: Medicare Other | Admitting: Hematology

## 2022-04-10 DIAGNOSIS — C9000 Multiple myeloma not having achieved remission: Secondary | ICD-10-CM

## 2022-04-11 ENCOUNTER — Other Ambulatory Visit: Payer: Self-pay

## 2022-04-11 DIAGNOSIS — C9000 Multiple myeloma not having achieved remission: Secondary | ICD-10-CM

## 2022-04-11 MED ORDER — POMALIDOMIDE 2 MG PO CAPS: ORAL_CAPSULE | ORAL | 0 refills | Status: DC

## 2022-04-11 NOTE — Telephone Encounter (Signed)
Chart reviewed. Pomalyst refilled per last office note with Dr. Katragadda.  

## 2022-04-18 ENCOUNTER — Other Ambulatory Visit: Payer: Self-pay

## 2022-04-18 MED ORDER — HYDROCODONE-ACETAMINOPHEN 7.5-325 MG PO TABS: 1.0000 | ORAL_TABLET | Freq: Four times a day (QID) | ORAL | 0 refills | Status: DC | PRN

## 2022-04-27 ENCOUNTER — Other Ambulatory Visit (HOSPITAL_COMMUNITY): Payer: Self-pay | Admitting: Hematology

## 2022-04-27 DIAGNOSIS — F419 Anxiety disorder, unspecified: Secondary | ICD-10-CM

## 2022-04-27 DIAGNOSIS — C9000 Multiple myeloma not having achieved remission: Secondary | ICD-10-CM

## 2022-04-30 ENCOUNTER — Encounter: Payer: Self-pay | Admitting: Gastroenterology

## 2022-04-30 ENCOUNTER — Ambulatory Visit (INDEPENDENT_AMBULATORY_CARE_PROVIDER_SITE_OTHER): Payer: Medicare Other | Admitting: Gastroenterology

## 2022-04-30 ENCOUNTER — Telehealth: Payer: Self-pay | Admitting: *Deleted

## 2022-04-30 VITALS — BP 146/73 | HR 57 | Temp 97.1°F | Ht 64.0 in | Wt 181.8 lb

## 2022-04-30 DIAGNOSIS — G8929 Other chronic pain: Secondary | ICD-10-CM

## 2022-04-30 DIAGNOSIS — R1011 Right upper quadrant pain: Secondary | ICD-10-CM | POA: Diagnosis not present

## 2022-04-30 DIAGNOSIS — K5900 Constipation, unspecified: Secondary | ICD-10-CM | POA: Insufficient documentation

## 2022-04-30 MED ORDER — POLYETHYLENE GLYCOL 3350 17 GM/SCOOP PO POWD: ORAL | 3 refills | Status: DC

## 2022-04-30 NOTE — Progress Notes (Signed)
GI Office Note    Referring Provider: [name], MD Primary Care Physician:  [name], MD  Primary Gastroenterologist: Elon Alas. Abbey Chatters, DO   Chief Complaint   Chief Complaint  Patient presents with   Follow-up    No new issues to discuss.     History of Present Illness   Alexandra Price is a 76 y.o. female presenting today for follow-up.  She was seen back in April for worsening right upper quadrant abdominal pain.  Also with issues of constipation.  Patient complains of right upper quadrant abdominal pain most of the time. Taking hydrocodone every six hours for this pain as well as shoulder and back pain. Used to help in the past but doesn't seem to be strong enough now. Patient states Dr. Delton Coombes told her if no improvement in pain by two hours after taking pain medication then to use ibuprofen. She averages about '400mg'$  daily. Not a lot of heartburn, sometimes with certain foods. States she has had a remote EGD. She didn't realize she was still on medication for reflux. No n/v. Sometimes hard to swallow pills. Feels like in throat. Sometimes problems with breads. Doesn't eat a lot of meat. BM doing ok. If skips a day, then painful stools. Takes Miralax takes prn, usually a few times per week. No melena, brbpr.  At time of last office visit, plan was to await PET scan findings and if unremarkable as far as her right upper quadrant pain, then pursue abdominal ultrasound.  PET scan in April, no abdominal pelvic parenchymal or nodal hypermetabolism seen.  Diffuse lytic lesions throughout, similar number of severity to prior PET, most consistent with treated myeloma.  Remote bilateral rib fractures.History of chronic left shoulder blade and right posterior rib pain noted by hematology/oncology.  Received XRT to the chest May 2022.    Colonoscopy May 2023: - Non-bleeding internal hemorrhoids. - Diverticulosis in the sigmoid colon. - One 11 mm polyp in the sigmoid colon,  removed with a cold snare. Resected and retrieved. - One 5 mm polyp in the descending colon, removed with a cold snare. Resected and retrieved. - The examination was otherwise normal. -Sigmoid colon tubulovillous adenoma -Descending colon tubular adenoma -Repeat colonoscopy in 3 years.    Medications   Current Outpatient Medications  Medication Sig Dispense Refill   acyclovir (ZOVIRAX) 400 MG tablet Take 1 tablet (400 mg total) by mouth 2 (two) times daily. 60 tablet 6   ALPRAZolam (XANAX) 0.5 MG tablet TAKE 1 TABLET BY MOUTH DURING THE DAY AS NEEDED FOR ANXIETY AND 2 TABLETS AT BEDTIME AS NEEDED FOR ANXIETY/INSOMNIA 90 tablet 0   apixaban (ELIQUIS) 5 MG TABS tablet Take 5 mg by mouth 2 (two) times daily.     Calcium Carb-Cholecalciferol (CALCIUM 1000 + D PO) Take 1,000 mg by mouth daily. Take one tablet daily until next visit with oncologist.     clotrimazole-betamethasone (LOTRISONE) cream APPLY CREAM EXTERNALY TWICE A DAY FOR 30 DAYS     escitalopram (LEXAPRO) 20 MG tablet Take 20 mg by mouth daily.     HYDROcodone-acetaminophen (NORCO) 7.5-325 MG tablet Take 1 tablet by mouth every 6 (six) hours as needed for moderate pain. 120 tablet 0   ibuprofen (ADVIL) 200 MG tablet Take 2 tablets (400 mg total) by mouth every 6 (six) hours as needed. 30 tablet 0   loperamide (IMODIUM) 2 MG capsule Take 1 capsule (2 mg total) by mouth as needed for diarrhea or loose stools.  30 capsule 0   Multiple Vitamin (MULTI VITAMIN) TABS 1 tablet Orally Once a day     pantoprazole (PROTONIX) 40 MG tablet Take 1 tablet (40 mg total) by mouth daily. 90 tablet 6   polyethylene glycol powder (GLYCOLAX/MIRALAX) 17 GM/SCOOP powder Take one capful once to twice daily as needed 255 g 3   pomalidomide (POMALYST) 2 MG capsule TAKE 1 CAPSULE BY MOUTH ONCE DAILY FOR 21 DAYS ON AND 7 DAYS OFF 21 capsule 0   rosuvastatin (CRESTOR) 10 MG tablet Take 10 mg by mouth daily.     zolpidem (AMBIEN) 10 MG tablet Take 1 tablet (10  mg total) by mouth at bedtime as needed for sleep. 30 tablet 2   No current facility-administered medications for this visit.    Allergies   Allergies as of 04/30/2022 - Review Complete 04/30/2022  Allergen Reaction Noted   Ciprofloxacin Anaphylaxis 06/04/2018   Amoxicillin Other (See Comments) 04/07/2019   Morphine and related Nausea And Vomiting 04/10/2018   Augmentin [amoxicillin-pot clavulanate] Other (See Comments)      Review of Systems   General: Negative for anorexia, weight loss, fever, chills, fatigue, weakness. ENT: Negative for hoarseness,  nasal congestion. See hpi CV: Negative for chest pain, angina, palpitations, dyspnea on exertion, peripheral edema.  Respiratory: Negative for dyspnea at rest, dyspnea on exertion, cough, sputum, wheezing.  GI: See history of present illness. GU:  Negative for dysuria, hematuria, urinary incontinence, urinary frequency, nocturnal urination.  Endo: Negative for unusual weight change.     Physical Exam   BP (!) 146/73 (BP Location: Right Arm, Patient Position: Sitting, Cuff Size: Normal)   Pulse (!) 57   Temp (!) 97.1 F (36.2 C) (Temporal)   Ht '5\' 4"'$  (1.626 m)   Wt 181 lb 12.8 oz (82.5 kg)   SpO2 100%   BMI 31.21 kg/m    General: Well-nourished, well-developed in no acute distress.  Eyes: No icterus. Mouth: Oropharyngeal mucosa moist and pink , no lesions erythema or exudate. Lungs: Clear to auscultation bilaterally.  Heart: Regular rate and rhythm, no murmurs rubs or gallops.  Abdomen: Bowel sounds are normal, nondistended, no hepatosplenomegaly or masses,  no abdominal bruits or hernia , no rebound or guarding. Moderate RUQ tenderness below the rib margin.  Rectal: not performed  Extremities: No lower extremity edema. No clubbing or deformities. Neuro: Alert and oriented x 4   Skin: Warm and dry, no jaundice.   Psych: Alert and cooperative, normal mood and affect.  Labs   Lab Results  Component Value Date    CREATININE 1.55 (H) 03/20/2022   BUN 25 (H) 03/20/2022   NA 138 03/20/2022   K 4.1 03/20/2022   CL 104 03/20/2022   CO2 27 03/20/2022   Lab Results  Component Value Date   ALT 31 03/20/2022   AST 26 03/20/2022   ALKPHOS 66 03/20/2022   BILITOT 0.8 03/20/2022   Lab Results  Component Value Date   WBC 2.6 (L) 03/20/2022   HGB 10.6 (L) 03/20/2022   HCT 32.1 (L) 03/20/2022   MCV 102.9 (H) 03/20/2022   PLT 125 (L) 03/20/2022   Lab Results  Component Value Date   IRON 89 12/14/2021   TIBC 278 12/14/2021   FERRITIN 386 (H) 12/14/2021   Lab Results  Component Value Date   UXNATFTD32 202 02/07/2022   Lab Results  Component Value Date   FOLATE >40.0 02/07/2022    Imaging Studies   No results found.  Assessment   *  RUQ pain *Constipation *Dysphagia   PLAN   Etiology of RUQ pain remains unclear. She has history of remote rib fractures bilaterally. Pain today, in RUQ below rib margin. No tenderness with palpation of ribs. Next PET in 05/2022. Obtain abdominal u/s to evaluate liver/gallbladder. LFTs have been normal. If not significant findings, she may require EGD to complete work up, especially given NSAID use. Encouraged her to limit ibuprofen as much as possible given Eliquis use. Continue PPI for GI protection.  Constipation doing better. Encouraged her to utilize Miralax regularly to maintain soft stools. She will assess need of Miralax daily, if no BM in 24 hours, she will use one to two capfuls of miralax.  Dysphagia, somewhat nonspecific. Fills like pills stuck in throat. Occasional dysphagia to bread. Will monitor. If u/s does not explain her pain, then consider EGD +/- esophageal dilation.   Laureen Ochs. Bobby Rumpf, [place], [facility]

## 2022-04-30 NOTE — Patient Instructions (Addendum)
Complete ultrasound to further evaluation your right sided abdominal pain. For constipation, continue to use miralax once capful once to twice daily to maintain regular soft stools. I would suggest you evaluate the need for Miralax on a daily basis. If you go more than 24 hours without a bowel movement you should take a dose or two of miralax.  Limit ibuprofen use as much as possible given your use of blood thinners. Ibuprofen can cause stomach ulcers. It is important to stay on pantoprazole as this can help reduce your risk of stomach ulcers.  If your ultrasound is negative, and your swallowing issues worsen, you may need an upper endoscopy.

## 2022-04-30 NOTE — H&P (View-Only) (Signed)
GI Office Note    Referring Provider: [name], MD Primary Care Physician:  [name], MD  Primary Gastroenterologist: Elon Alas. Abbey Chatters, DO   Chief Complaint   Chief Complaint  Patient presents with   Follow-up    No new issues to discuss.     History of Present Illness   Alexandra Price is a 76 y.o. female presenting today for follow-up.  She was seen back in April for worsening right upper quadrant abdominal pain.  Also with issues of constipation.  Patient complains of right upper quadrant abdominal pain most of the time. Taking hydrocodone every six hours for this pain as well as shoulder and back pain. Used to help in the past but doesn't seem to be strong enough now. Patient states Dr. Delton Coombes told her if no improvement in pain by two hours after taking pain medication then to use ibuprofen. She averages about '400mg'$  daily. Not a lot of heartburn, sometimes with certain foods. States she has had a remote EGD. She didn't realize she was still on medication for reflux. No n/v. Sometimes hard to swallow pills. Feels like in throat. Sometimes problems with breads. Doesn't eat a lot of meat. BM doing ok. If skips a day, then painful stools. Takes Miralax takes prn, usually a few times per week. No melena, brbpr.  At time of last office visit, plan was to await PET scan findings and if unremarkable as far as her right upper quadrant pain, then pursue abdominal ultrasound.  PET scan in April, no abdominal pelvic parenchymal or nodal hypermetabolism seen.  Diffuse lytic lesions throughout, similar number of severity to prior PET, most consistent with treated myeloma.  Remote bilateral rib fractures.History of chronic left shoulder blade and right posterior rib pain noted by hematology/oncology.  Received XRT to the chest May 2022.    Colonoscopy May 2023: - Non-bleeding internal hemorrhoids. - Diverticulosis in the sigmoid colon. - One 11 mm polyp in the sigmoid colon,  removed with a cold snare. Resected and retrieved. - One 5 mm polyp in the descending colon, removed with a cold snare. Resected and retrieved. - The examination was otherwise normal. -Sigmoid colon tubulovillous adenoma -Descending colon tubular adenoma -Repeat colonoscopy in 3 years.    Medications   Current Outpatient Medications  Medication Sig Dispense Refill   acyclovir (ZOVIRAX) 400 MG tablet Take 1 tablet (400 mg total) by mouth 2 (two) times daily. 60 tablet 6   ALPRAZolam (XANAX) 0.5 MG tablet TAKE 1 TABLET BY MOUTH DURING THE DAY AS NEEDED FOR ANXIETY AND 2 TABLETS AT BEDTIME AS NEEDED FOR ANXIETY/INSOMNIA 90 tablet 0   apixaban (ELIQUIS) 5 MG TABS tablet Take 5 mg by mouth 2 (two) times daily.     Calcium Carb-Cholecalciferol (CALCIUM 1000 + D PO) Take 1,000 mg by mouth daily. Take one tablet daily until next visit with oncologist.     clotrimazole-betamethasone (LOTRISONE) cream APPLY CREAM EXTERNALY TWICE A DAY FOR 30 DAYS     escitalopram (LEXAPRO) 20 MG tablet Take 20 mg by mouth daily.     HYDROcodone-acetaminophen (NORCO) 7.5-325 MG tablet Take 1 tablet by mouth every 6 (six) hours as needed for moderate pain. 120 tablet 0   ibuprofen (ADVIL) 200 MG tablet Take 2 tablets (400 mg total) by mouth every 6 (six) hours as needed. 30 tablet 0   loperamide (IMODIUM) 2 MG capsule Take 1 capsule (2 mg total) by mouth as needed for diarrhea or loose stools.  30 capsule 0   Multiple Vitamin (MULTI VITAMIN) TABS 1 tablet Orally Once a day     pantoprazole (PROTONIX) 40 MG tablet Take 1 tablet (40 mg total) by mouth daily. 90 tablet 6   polyethylene glycol powder (GLYCOLAX/MIRALAX) 17 GM/SCOOP powder Take one capful once to twice daily as needed 255 g 3   pomalidomide (POMALYST) 2 MG capsule TAKE 1 CAPSULE BY MOUTH ONCE DAILY FOR 21 DAYS ON AND 7 DAYS OFF 21 capsule 0   rosuvastatin (CRESTOR) 10 MG tablet Take 10 mg by mouth daily.     zolpidem (AMBIEN) 10 MG tablet Take 1 tablet (10  mg total) by mouth at bedtime as needed for sleep. 30 tablet 2   No current facility-administered medications for this visit.    Allergies   Allergies as of 04/30/2022 - Review Complete 04/30/2022  Allergen Reaction Noted   Ciprofloxacin Anaphylaxis 06/04/2018   Amoxicillin Other (See Comments) 04/07/2019   Morphine and related Nausea And Vomiting 04/10/2018   Augmentin [amoxicillin-pot clavulanate] Other (See Comments)      Review of Systems   General: Negative for anorexia, weight loss, fever, chills, fatigue, weakness. ENT: Negative for hoarseness,  nasal congestion. See hpi CV: Negative for chest pain, angina, palpitations, dyspnea on exertion, peripheral edema.  Respiratory: Negative for dyspnea at rest, dyspnea on exertion, cough, sputum, wheezing.  GI: See history of present illness. GU:  Negative for dysuria, hematuria, urinary incontinence, urinary frequency, nocturnal urination.  Endo: Negative for unusual weight change.     Physical Exam   BP (!) 146/73 (BP Location: Right Arm, Patient Position: Sitting, Cuff Size: Normal)   Pulse (!) 57   Temp (!) 97.1 F (36.2 C) (Temporal)   Ht '5\' 4"'$  (1.626 m)   Wt 181 lb 12.8 oz (82.5 kg)   SpO2 100%   BMI 31.21 kg/m    General: Well-nourished, well-developed in no acute distress.  Eyes: No icterus. Mouth: Oropharyngeal mucosa moist and pink , no lesions erythema or exudate. Lungs: Clear to auscultation bilaterally.  Heart: Regular rate and rhythm, no murmurs rubs or gallops.  Abdomen: Bowel sounds are normal, nondistended, no hepatosplenomegaly or masses,  no abdominal bruits or hernia , no rebound or guarding. Moderate RUQ tenderness below the rib margin.  Rectal: not performed  Extremities: No lower extremity edema. No clubbing or deformities. Neuro: Alert and oriented x 4   Skin: Warm and dry, no jaundice.   Psych: Alert and cooperative, normal mood and affect.  Labs   Lab Results  Component Value Date    CREATININE 1.55 (H) 03/20/2022   BUN 25 (H) 03/20/2022   NA 138 03/20/2022   K 4.1 03/20/2022   CL 104 03/20/2022   CO2 27 03/20/2022   Lab Results  Component Value Date   ALT 31 03/20/2022   AST 26 03/20/2022   ALKPHOS 66 03/20/2022   BILITOT 0.8 03/20/2022   Lab Results  Component Value Date   WBC 2.6 (L) 03/20/2022   HGB 10.6 (L) 03/20/2022   HCT 32.1 (L) 03/20/2022   MCV 102.9 (H) 03/20/2022   PLT 125 (L) 03/20/2022   Lab Results  Component Value Date   IRON 89 12/14/2021   TIBC 278 12/14/2021   FERRITIN 386 (H) 12/14/2021   Lab Results  Component Value Date   [id number] 277 02/07/2022   Lab Results  Component Value Date   FOLATE >40.0 02/07/2022    Imaging Studies   No results found.  Assessment   *  RUQ pain *Constipation *Dysphagia   PLAN   Etiology of RUQ pain remains unclear. She has history of remote rib fractures bilaterally. Pain today, in RUQ below rib margin. No tenderness with palpation of ribs. Next PET in 05/2022. Obtain abdominal u/s to evaluate liver/gallbladder. LFTs have been normal. If not significant findings, she may require EGD to complete work up, especially given NSAID use. Encouraged her to limit ibuprofen as much as possible given Eliquis use. Continue PPI for GI protection.  Constipation doing better. Encouraged her to utilize Miralax regularly to maintain soft stools. She will assess need of Miralax daily, if no BM in 24 hours, she will use one to two capfuls of miralax.  Dysphagia, somewhat nonspecific. Fills like pills stuck in throat. Occasional dysphagia to bread. Will monitor. If u/s does not explain her pain, then consider EGD +/- esophageal dilation.   Laureen Ochs. Bobby Rumpf, [place], [facility]

## 2022-04-30 NOTE — Telephone Encounter (Signed)
Alexandra Price is scheduled for 05/04/22 at 10:30 am, arrive at 10:15 am, nothing to eat or drink after midnight.  Patient informed of appt date and time, nothing to eat or drink after midnight. Verbalized understanding.

## 2022-05-04 ENCOUNTER — Ambulatory Visit (HOSPITAL_COMMUNITY)
Admission: RE | Admit: 2022-05-04 | Discharge: 2022-05-04 | Disposition: A | Discharge status: Home / Self Care | Payer: Medicare Other | Source: Ambulatory Visit | Attending: Gastroenterology | Admitting: Gastroenterology

## 2022-05-04 DIAGNOSIS — R1011 Right upper quadrant pain: Secondary | ICD-10-CM | POA: Insufficient documentation

## 2022-05-04 DIAGNOSIS — G8929 Other chronic pain: Secondary | ICD-10-CM | POA: Diagnosis present

## 2022-05-07 ENCOUNTER — Telehealth: Payer: Self-pay

## 2022-05-07 NOTE — Telephone Encounter (Signed)
Pt called wanting to know the results to her ultrasound that she had done on Friday.

## 2022-05-08 ENCOUNTER — Encounter: Payer: Self-pay | Admitting: *Deleted

## 2022-05-08 NOTE — Telephone Encounter (Signed)
See result note.  

## 2022-05-10 ENCOUNTER — Other Ambulatory Visit: Payer: Self-pay | Admitting: Hematology

## 2022-05-10 DIAGNOSIS — C9000 Multiple myeloma not having achieved remission: Secondary | ICD-10-CM

## 2022-05-15 ENCOUNTER — Other Ambulatory Visit: Payer: Self-pay

## 2022-05-15 DIAGNOSIS — C9000 Multiple myeloma not having achieved remission: Secondary | ICD-10-CM

## 2022-05-15 MED ORDER — POMALIDOMIDE 2 MG PO CAPS: ORAL_CAPSULE | ORAL | 0 refills | Status: DC

## 2022-05-15 NOTE — Telephone Encounter (Signed)
Chart reviewed. Pomalyst refilled per last office note with Dr. Katragadda.  

## 2022-05-16 ENCOUNTER — Encounter (HOSPITAL_COMMUNITY): Payer: Self-pay | Admitting: Hematology

## 2022-05-17 ENCOUNTER — Other Ambulatory Visit: Payer: Self-pay

## 2022-05-17 MED ORDER — HYDROCODONE-ACETAMINOPHEN 7.5-325 MG PO TABS: 1.0000 | ORAL_TABLET | Freq: Four times a day (QID) | ORAL | 0 refills | Status: DC | PRN

## 2022-05-22 NOTE — Patient Instructions (Addendum)
20    Your procedure is scheduled on: 05/28/2022  Report to [facility] at    8:00 AM.  Call this number if you have problems the morning of surgery: [phone]   Remember:   Follow instructions on letter from office regarding when to stop eating and drinking        No Smoking the day of procedure      Take these medicines the morning of surgery with A SIP OF WATER: Lexapro, Pantoprazole, and hydrocodone if needed  Hold Eliquis 48 hours as instructed in letterEsophageal Dilatation Esophageal dilatation, also called esophageal dilation, is a procedure to widen or open a blocked or narrowed part of the esophagus. The esophagus is the part of the body that moves food and liquid from the mouth to the stomach. You may need this procedure if: You have a buildup of scar tissue in your esophagus that makes it difficult, painful, or impossible to swallow. This can be caused by gastroesophageal reflux disease (GERD). You have cancer of the esophagus. There is a problem with how food moves through your esophagus. In some cases, you may need this procedure repeated at a later time to dilate the esophagus gradually. Tell a health care provider about: Any allergies you have. All medicines you are taking, including vitamins, herbs, eye drops, creams, and over-the-counter medicines. Any problems you or family members have had with anesthetic medicines. Any blood disorders you have. Any surgeries you have had. Any medical conditions you have. Any antibiotic medicines you are required to take before dental procedures. Whether you are pregnant or may be pregnant. What are the risks? Generally, this is a safe procedure. However, problems may occur, including: Bleeding due to a tear in the lining of the esophagus. A hole, or perforation, in the esophagus. What happens before the procedure? Ask your health care provider about: Changing or stopping your regular medicines. This is especially  important if you are taking diabetes medicines or blood thinners. Taking medicines such as aspirin and ibuprofen. These medicines can thin your blood. Do not take these medicines unless your health care provider tells you to take them. Taking over-the-counter medicines, vitamins, herbs, and supplements. Follow instructions from your health care provider about eating or drinking restrictions. Plan to have a responsible adult take you home from the hospital or clinic. Plan to have a responsible adult care for you for the time you are told after you leave the hospital or clinic. This is important. What happens during the procedure? You may be given a medicine to help you relax (sedative). A numbing medicine may be sprayed into the back of your throat, or you may gargle the medicine. Your health care provider may perform the dilatation using various surgical instruments, such as: Simple dilators. This instrument is carefully placed in the esophagus to stretch it. Guided wire bougies. This involves using an endoscope to insert a wire into the esophagus. A dilator is passed over this wire to enlarge the esophagus. Then the wire is removed. Balloon dilators. An endoscope with a small balloon is inserted into the esophagus. The balloon is inflated to stretch the esophagus and open it up. The procedure may vary among health care providers and hospitals. What can I expect after the procedure? Your blood pressure, heart rate, breathing rate, and blood oxygen level will be monitored until you leave the hospital or clinic. Your throat may feel slightly sore and numb. This will get better over time. You will not  be allowed to eat or drink until your throat is no longer numb. When you are able to drink, urinate, and sit on the edge of the bed without nausea or dizziness, you may be able to return home. Follow these instructions at home: Take over-the-counter and prescription medicines only as told by your  health care provider. If you were given a sedative during the procedure, it can affect you for several hours. Do not drive or operate machinery until your health care provider says that it is safe. Plan to have a responsible adult care for you for the time you are told. This is important. Follow instructions from your health care provider about any eating or drinking restrictions. Do not use any products that contain nicotine or tobacco, such as cigarettes, e-cigarettes, and chewing tobacco. If you need help quitting, ask your health care provider. Keep all follow-up visits. This is important. Contact a health care provider if: You have a fever. You have pain that is not relieved by medicine. Get help right away if: You have chest pain. You have trouble breathing. You have trouble swallowing. You vomit blood. You have black, tarry, or bloody stools. These symptoms may represent a serious problem that is an emergency. Do not wait to see if the symptoms will go away. Get medical help right away. Call your local emergency services (911 in the [place] Do not drive yourself to the hospital. Summary Esophageal dilatation, also called esophageal dilation, is a procedure to widen or open a blocked or narrowed part of the esophagus. Plan to have a responsible adult take you home from the hospital or clinic. For this procedure, a numbing medicine may be sprayed into the back of your throat, or you may gargle the medicine. Do not drive or operate machinery until your health care provider says that it is safe. This information is not intended to replace advice given to you by your health care provider. Make sure you discuss any questions you have with your health care provider. Document Revised: 12/23/2019 Document Reviewed: 12/23/2019 Elsevier Patient Education  [place] not wear jewelry, make-up or nail polish.  Do not wear lotions, powders, or perfumes. You may wear deodorant.                 Do not bring valuables to the hospital.  Contacts, dentures or bridgework may not be worn into surgery.  Leave suitcase in the car. After surgery it may be brought to your room.  For patients admitted to the hospital, checkout time is 11:00 AM the day of discharge.   Patients discharged the day of surgery will not be allowed to drive home. Upper Endoscopy, Adult Upper endoscopy is a procedure to look inside the upper GI (gastrointestinal) tract. The upper GI tract is made up of: The part of the body that moves food from your mouth to your stomach (esophagus). The stomach. The first part of your small intestine (duodenum). This procedure is also called esophagogastroduodenoscopy (EGD) or gastroscopy. In this procedure, your health care provider passes a thin, flexible tube (endoscope) through your mouth and down your esophagus into your stomach. A small camera is attached to the end of the tube. Images from the camera appear on a monitor in the exam room. During this procedure, your health care provider may also remove a small piece of tissue to be sent to a lab and examined under a microscope (biopsy). Your health care provider may do an  upper endoscopy to diagnose cancers of the upper GI tract. You may also have this procedure to find the cause of other conditions, such as: Stomach pain. Heartburn. Pain or problems when swallowing. Nausea and vomiting. Stomach bleeding. Stomach ulcers. Tell a health care provider about: Any allergies you have. All medicines you are taking, including vitamins, herbs, eye drops, creams, and over-the-counter medicines. Any problems you or family members have had with anesthetic medicines. Any blood disorders you have. Any surgeries you have had. Any medical conditions you have. Whether you are pregnant or may be pregnant. What are the risks? Generally, this is a safe procedure. However, problems may occur,  including: Infection. Bleeding. Allergic reactions to medicines. A tear or hole (perforation) in the esophagus, stomach, or duodenum. What happens before the procedure? Staying hydrated Follow instructions from your health care provider about hydration, which may include: Up to 4 hours before the procedure - you may continue to drink clear liquids, such as water, clear fruit juice, black coffee, and plain tea.   Medicines Ask your health care provider about: Changing or stopping your regular medicines. This is especially important if you are taking diabetes medicines or blood thinners. Taking medicines such as aspirin and ibuprofen. These medicines can thin your blood. Do not take these medicines unless your health care provider tells you to take them. Taking over-the-counter medicines, vitamins, herbs, and supplements. General instructions Plan to have someone take you home from the hospital or clinic. If you will be going home right after the procedure, plan to have someone with you for 24 hours. Ask your health care provider what steps will be taken to help prevent infection. What happens during the procedure?  An IV will be inserted into one of your veins. You may be given one or more of the following: A medicine to help you relax (sedative). A medicine to numb the throat (local anesthetic). You will lie on your left side on an exam table. Your health care provider will pass the endoscope through your mouth and down your esophagus. Your health care provider will use the scope to check the inside of your esophagus, stomach, and duodenum. Biopsies may be taken. The endoscope will be removed. The procedure may vary among health care providers and hospitals. What happens after the procedure? Your blood pressure, heart rate, breathing rate, and blood oxygen level will be monitored until you leave the hospital or clinic. Do not drive for 24 hours if you were given a sedative during  your procedure. When your throat is no longer numb, you may be given some fluids to drink. It is up to you to get the results of your procedure. Ask your health care provider, or the department that is doing the procedure, when your results will be ready. Summary Upper endoscopy is a procedure to look inside the upper GI tract. During the procedure, an IV will be inserted into one of your veins. You may be given a medicine to help you relax. A medicine will be used to numb your throat. The endoscope will be passed through your mouth and down your esophagus. This information is not intended to replace advice given to you by your health care provider. Make sure you discuss any questions you have with your health care provider. Document Revised: 01/29/2018 Document Reviewed: 01/06/2018 Elsevier Patient Education  [place].  EndoscopyCare After  Please read the instructions outlined below and refer to this sheet in the next few weeks. These discharge instructions provide you with general information on caring for yourself after you leave the hospital. Your doctor may also give you specific instructions. While your treatment has been planned according to the most current medical practices available, unavoidable complications occasionally occur. If you have any problems or questions after discharge, please call your doctor. HOME CARE INSTRUCTIONS Activity You may resume your regular activity but move at a slower pace for the next 24 hours.  Take frequent rest periods for the next 24 hours.  Walking will help expel (get rid of) the air and reduce the bloated feeling in your abdomen.  No driving for 24 hours (because of the anesthesia (medicine) used during the test).  You may shower.  Do not sign any important legal documents or operate any machinery for 24  hours (because of the anesthesia used during the test).  Nutrition Drink plenty of fluids.  You may resume your normal diet.  Begin with a light meal and progress to your normal diet.  Avoid alcoholic beverages for 24 hours or as instructed by your caregiver.  Medications You may resume your normal medications unless your caregiver tells you otherwise. What you can expect today You may experience abdominal discomfort such as a feeling of fullness or "gas" pains.  You may experience a sore throat for 2 to 3 days. This is normal. Gargling with salt water may help this.  Follow-up Your doctor will discuss the results of your test with you. SEEK IMMEDIATE MEDICAL CARE IF: You have excessive nausea (feeling sick to your stomach) and/or vomiting.  You have severe abdominal pain and distention (swelling).  You have trouble swallowing.  You have a temperature over 100 F (37.8 C).  You have rectal bleeding or vomiting of blood.  Document Released: 03/20/2004 Document Revised: 07/26/2011 Document Reviewed: 10/01/2007

## 2022-05-23 ENCOUNTER — Other Ambulatory Visit: Payer: Self-pay

## 2022-05-23 DIAGNOSIS — C9000 Multiple myeloma not having achieved remission: Secondary | ICD-10-CM

## 2022-05-23 DIAGNOSIS — G479 Sleep disorder, unspecified: Secondary | ICD-10-CM

## 2022-05-23 DIAGNOSIS — F419 Anxiety disorder, unspecified: Secondary | ICD-10-CM

## 2022-05-23 MED ORDER — ZOLPIDEM TARTRATE 10 MG PO TABS: 10.0000 mg | ORAL_TABLET | Freq: Every evening | ORAL | 2 refills | Status: DC | PRN

## 2022-05-23 MED ORDER — ALPRAZOLAM 0.5 MG PO TABS: ORAL_TABLET | ORAL | 0 refills | Status: DC

## 2022-05-24 ENCOUNTER — Inpatient Hospital Stay: Payer: Medicare Other

## 2022-05-24 ENCOUNTER — Encounter (HOSPITAL_COMMUNITY)
Admission: RE | Admit: 2022-05-24 | Discharge: 2022-05-24 | Disposition: A | Discharge status: Home / Self Care | Payer: Medicare Other | Source: Ambulatory Visit | Attending: Hematology | Admitting: Hematology

## 2022-05-24 DIAGNOSIS — M545 Low back pain, unspecified: Secondary | ICD-10-CM | POA: Insufficient documentation

## 2022-05-24 DIAGNOSIS — Z79899 Other long term (current) drug therapy: Secondary | ICD-10-CM | POA: Insufficient documentation

## 2022-05-24 DIAGNOSIS — Z7901 Long term (current) use of anticoagulants: Secondary | ICD-10-CM | POA: Insufficient documentation

## 2022-05-24 DIAGNOSIS — D631 Anemia in chronic kidney disease: Secondary | ICD-10-CM | POA: Insufficient documentation

## 2022-05-24 DIAGNOSIS — Z79624 Long term (current) use of inhibitors of nucleotide synthesis: Secondary | ICD-10-CM | POA: Insufficient documentation

## 2022-05-24 DIAGNOSIS — Z9484 Stem cells transplant status: Secondary | ICD-10-CM | POA: Insufficient documentation

## 2022-05-24 DIAGNOSIS — M79629 Pain in unspecified upper arm: Secondary | ICD-10-CM | POA: Insufficient documentation

## 2022-05-24 DIAGNOSIS — Z7961 Long term (current) use of immunomodulator: Secondary | ICD-10-CM | POA: Insufficient documentation

## 2022-05-24 DIAGNOSIS — Z86711 Personal history of pulmonary embolism: Secondary | ICD-10-CM | POA: Insufficient documentation

## 2022-05-24 DIAGNOSIS — C9000 Multiple myeloma not having achieved remission: Secondary | ICD-10-CM

## 2022-05-24 DIAGNOSIS — F419 Anxiety disorder, unspecified: Secondary | ICD-10-CM | POA: Insufficient documentation

## 2022-05-24 DIAGNOSIS — N189 Chronic kidney disease, unspecified: Secondary | ICD-10-CM | POA: Insufficient documentation

## 2022-05-24 DIAGNOSIS — Z5112 Encounter for antineoplastic immunotherapy: Secondary | ICD-10-CM | POA: Insufficient documentation

## 2022-05-24 DIAGNOSIS — Z23 Encounter for immunization: Secondary | ICD-10-CM | POA: Insufficient documentation

## 2022-05-24 LAB — COMPREHENSIVE METABOLIC PANEL
ALT: 30 U/L (ref 0–44)
AST: 24 U/L (ref 15–41)
Albumin: 4 g/dL (ref 3.5–5.0)
Alkaline Phosphatase: 76 U/L (ref 38–126)
Anion gap: 8 (ref 5–15)
BUN: 31 mg/dL — ABNORMAL HIGH (ref 8–23)
CO2: 29 mmol/L (ref 22–32)
Calcium: 9.1 mg/dL (ref 8.9–10.3)
Chloride: 104 mmol/L (ref 98–111)
Creatinine, Ser: 1.58 mg/dL — ABNORMAL HIGH (ref 0.44–1.00)
GFR, Estimated: 34 mL/min — ABNORMAL LOW (ref >60)
Glucose, Bld: 149 mg/dL — ABNORMAL HIGH (ref 70–99)
Potassium: 4.3 mmol/L (ref 3.5–5.1)
Sodium: 141 mmol/L (ref 135–145)
Total Bilirubin: 0.8 mg/dL (ref 0.3–1.2)
Total Protein: 7.3 g/dL (ref 6.5–8.1)

## 2022-05-24 LAB — IRON AND TIBC
Iron: 74 ug/dL (ref 28–170)
Saturation Ratios: 27 % (ref 10.4–31.8)
TIBC: 272 ug/dL (ref 250–450)
UIBC: 198 ug/dL

## 2022-05-24 LAB — CBC WITH DIFFERENTIAL/PLATELET
Abs Immature Granulocytes: 0.01 10*3/uL (ref 0.00–0.07)
Basophils Absolute: 0.1 10*3/uL (ref 0.0–0.1)
Basophils Relative: 2 %
Eosinophils Absolute: 0.2 10*3/uL (ref 0.0–0.5)
Eosinophils Relative: 5 %
HCT: 32.4 % — ABNORMAL LOW (ref 36.0–46.0)
Hemoglobin: 10.6 g/dL — ABNORMAL LOW (ref 12.0–15.0)
Immature Granulocytes: 0 %
Lymphocytes Relative: 31 %
Lymphs Abs: 0.9 10*3/uL (ref 0.7–4.0)
MCH: 33.3 pg (ref 26.0–34.0)
MCHC: 32.7 g/dL (ref 30.0–36.0)
MCV: 101.9 fL — ABNORMAL HIGH (ref 80.0–100.0)
Monocytes Absolute: 0.5 10*3/uL (ref 0.1–1.0)
Monocytes Relative: 16 %
Neutro Abs: 1.4 10*3/uL — ABNORMAL LOW (ref 1.7–7.7)
Neutrophils Relative %: 46 %
Platelets: 139 10*3/uL — ABNORMAL LOW (ref 150–400)
RBC: 3.18 MIL/uL — ABNORMAL LOW (ref 3.87–5.11)
RDW: 13 % (ref 11.5–15.5)
WBC: 3 10*3/uL — ABNORMAL LOW (ref 4.0–10.5)
nRBC: 0 % (ref 0.0–0.2)

## 2022-05-24 LAB — LACTATE DEHYDROGENASE: LDH: 129 U/L (ref 98–192)

## 2022-05-24 LAB — FERRITIN: Ferritin: 435 ng/mL — ABNORMAL HIGH (ref 11–307)

## 2022-05-24 MED ORDER — FLUDEOXYGLUCOSE F - 18 (FDG) INJECTION
9.0700 | Freq: Once | INTRAVENOUS | Status: AC | PRN
  Administered 2022-05-24: 9.07 via INTRAVENOUS

## 2022-05-25 ENCOUNTER — Other Ambulatory Visit: Payer: Self-pay

## 2022-05-25 ENCOUNTER — Encounter (HOSPITAL_COMMUNITY): Payer: Self-pay

## 2022-05-25 ENCOUNTER — Encounter (HOSPITAL_COMMUNITY)
Admission: RE | Admit: 2022-05-25 | Discharge: 2022-05-25 | Disposition: A | Discharge status: Home / Self Care | Payer: Medicare Other | Source: Ambulatory Visit | Attending: Internal Medicine | Admitting: Internal Medicine

## 2022-05-25 ENCOUNTER — Other Ambulatory Visit (HOSPITAL_COMMUNITY): Payer: Self-pay | Admitting: Hematology

## 2022-05-25 VITALS — BP 157/70 | HR 53 | Temp 97.9°F | Resp 16 | Ht 64.0 in | Wt 180.0 lb

## 2022-05-25 DIAGNOSIS — R9431 Abnormal electrocardiogram [ECG] [EKG]: Secondary | ICD-10-CM | POA: Diagnosis not present

## 2022-05-25 DIAGNOSIS — R001 Bradycardia, unspecified: Secondary | ICD-10-CM | POA: Diagnosis not present

## 2022-05-25 DIAGNOSIS — F419 Anxiety disorder, unspecified: Secondary | ICD-10-CM

## 2022-05-25 DIAGNOSIS — I10 Essential (primary) hypertension: Secondary | ICD-10-CM | POA: Diagnosis not present

## 2022-05-25 DIAGNOSIS — C9000 Multiple myeloma not having achieved remission: Secondary | ICD-10-CM

## 2022-05-25 DIAGNOSIS — Z01810 Encounter for preprocedural cardiovascular examination: Secondary | ICD-10-CM | POA: Diagnosis present

## 2022-05-25 HISTORY — DX: Prediabetes: R73.03

## 2022-05-25 HISTORY — DX: Chronic kidney disease, unspecified: N18.9

## 2022-05-25 LAB — KAPPA/LAMBDA LIGHT CHAINS
Kappa free light chain: 26.4 mg/L — ABNORMAL HIGH (ref 3.3–19.4)
Kappa, lambda light chain ratio: 0.14 — ABNORMAL LOW (ref 0.26–1.65)
Lambda free light chains: 182.8 mg/L — ABNORMAL HIGH (ref 5.7–26.3)

## 2022-05-28 ENCOUNTER — Encounter (HOSPITAL_COMMUNITY)
Admission: RE | Disposition: A | Discharge status: Home / Self Care | Payer: Self-pay | Source: Home / Self Care | Attending: Internal Medicine

## 2022-05-28 ENCOUNTER — Ambulatory Visit (HOSPITAL_COMMUNITY): Payer: Medicare Other | Admitting: Certified Registered Nurse Anesthetist

## 2022-05-28 ENCOUNTER — Ambulatory Visit (HOSPITAL_BASED_OUTPATIENT_CLINIC_OR_DEPARTMENT_OTHER): Payer: Medicare Other | Admitting: Certified Registered Nurse Anesthetist

## 2022-05-28 ENCOUNTER — Ambulatory Visit (HOSPITAL_COMMUNITY)
Admission: RE | Admit: 2022-05-28 | Discharge: 2022-05-28 | Disposition: A | Discharge status: Home / Self Care | Payer: Medicare Other | Attending: Internal Medicine | Admitting: Internal Medicine

## 2022-05-28 DIAGNOSIS — K2970 Gastritis, unspecified, without bleeding: Secondary | ICD-10-CM

## 2022-05-28 DIAGNOSIS — K5900 Constipation, unspecified: Secondary | ICD-10-CM | POA: Diagnosis not present

## 2022-05-28 DIAGNOSIS — C9000 Multiple myeloma not having achieved remission: Secondary | ICD-10-CM | POA: Diagnosis not present

## 2022-05-28 DIAGNOSIS — Z79899 Other long term (current) drug therapy: Secondary | ICD-10-CM | POA: Diagnosis not present

## 2022-05-28 DIAGNOSIS — F418 Other specified anxiety disorders: Secondary | ICD-10-CM

## 2022-05-28 DIAGNOSIS — M549 Dorsalgia, unspecified: Secondary | ICD-10-CM | POA: Insufficient documentation

## 2022-05-28 DIAGNOSIS — F419 Anxiety disorder, unspecified: Secondary | ICD-10-CM | POA: Insufficient documentation

## 2022-05-28 DIAGNOSIS — Z79891 Long term (current) use of opiate analgesic: Secondary | ICD-10-CM | POA: Diagnosis not present

## 2022-05-28 DIAGNOSIS — K449 Diaphragmatic hernia without obstruction or gangrene: Secondary | ICD-10-CM

## 2022-05-28 DIAGNOSIS — Z7901 Long term (current) use of anticoagulants: Secondary | ICD-10-CM | POA: Insufficient documentation

## 2022-05-28 DIAGNOSIS — R1011 Right upper quadrant pain: Secondary | ICD-10-CM | POA: Diagnosis present

## 2022-05-28 DIAGNOSIS — Z86711 Personal history of pulmonary embolism: Secondary | ICD-10-CM | POA: Diagnosis not present

## 2022-05-28 DIAGNOSIS — I10 Essential (primary) hypertension: Secondary | ICD-10-CM | POA: Insufficient documentation

## 2022-05-28 DIAGNOSIS — K219 Gastro-esophageal reflux disease without esophagitis: Secondary | ICD-10-CM | POA: Diagnosis not present

## 2022-05-28 DIAGNOSIS — F32A Depression, unspecified: Secondary | ICD-10-CM | POA: Insufficient documentation

## 2022-05-28 DIAGNOSIS — G8929 Other chronic pain: Secondary | ICD-10-CM

## 2022-05-28 DIAGNOSIS — Z8781 Personal history of (healed) traumatic fracture: Secondary | ICD-10-CM | POA: Diagnosis not present

## 2022-05-28 DIAGNOSIS — M25519 Pain in unspecified shoulder: Secondary | ICD-10-CM | POA: Insufficient documentation

## 2022-05-28 DIAGNOSIS — R1310 Dysphagia, unspecified: Secondary | ICD-10-CM | POA: Insufficient documentation

## 2022-05-28 DIAGNOSIS — K2950 Unspecified chronic gastritis without bleeding: Secondary | ICD-10-CM | POA: Diagnosis not present

## 2022-05-28 HISTORY — PX: BIOPSY: SHX5522

## 2022-05-28 HISTORY — PX: BALLOON DILATION: SHX5330

## 2022-05-28 HISTORY — PX: ESOPHAGOGASTRODUODENOSCOPY (EGD) WITH PROPOFOL: SHX5813

## 2022-05-28 LAB — PROTEIN ELECTROPHORESIS, SERUM
A/G Ratio: 1.3 (ref 0.7–1.7)
Albumin ELP: 3.7 g/dL (ref 2.9–4.4)
Alpha-1-Globulin: 0.2 g/dL (ref 0.0–0.4)
Alpha-2-Globulin: 0.8 g/dL (ref 0.4–1.0)
Beta Globulin: 0.9 g/dL (ref 0.7–1.3)
Gamma Globulin: 1 g/dL (ref 0.4–1.8)
Globulin, Total: 2.9 g/dL (ref 2.2–3.9)
M-Spike, %: 0.4 g/dL — ABNORMAL HIGH
Total Protein ELP: 6.6 g/dL (ref 6.0–8.5)

## 2022-05-28 LAB — GLUCOSE, CAPILLARY: Glucose-Capillary: 132 mg/dL — ABNORMAL HIGH (ref 70–99)

## 2022-05-28 SURGERY — ESOPHAGOGASTRODUODENOSCOPY (EGD) WITH PROPOFOL
Anesthesia: General

## 2022-05-28 MED ORDER — LIDOCAINE HCL (CARDIAC) PF 100 MG/5ML IV SOSY
PREFILLED_SYRINGE | INTRAVENOUS | Status: DC | PRN
  Administered 2022-05-28: 50 mg via INTRATRACHEAL

## 2022-05-28 MED ORDER — PROPOFOL 10 MG/ML IV BOLUS
INTRAVENOUS | Status: DC | PRN
  Administered 2022-05-28: 70 mg via INTRAVENOUS

## 2022-05-28 MED ORDER — LACTATED RINGERS IV SOLN: INTRAVENOUS | Status: DC

## 2022-05-28 NOTE — Transfer of Care (Signed)
Immediate Anesthesia Transfer of Care Note  Patient: Alexandra Price  Procedure(s) Performed: ESOPHAGOGASTRODUODENOSCOPY (EGD) WITH PROPOFOL BALLOON DILATION BIOPSY  Patient Location: Short Stay  Anesthesia Type:General  Level of Consciousness: sedated and patient cooperative  Airway & Oxygen Therapy: Patient Spontanous Breathing  Post-op Assessment: Report given to RN, Post -op Vital signs reviewed and stable, Patient moving all extremities and Patient moving all extremities X 4  Post vital signs: Reviewed and stable  Last Vitals:  Vitals Value Taken Time  BP    Temp    Pulse    Resp    SpO2      Last Pain:  Vitals:   05/28/22 0908  PainSc: 3          Complications: No notable events documented.

## 2022-05-28 NOTE — Anesthesia Postprocedure Evaluation (Signed)
Anesthesia Post Note  Patient: ZONNIE LANDEN  Procedure(s) Performed: ESOPHAGOGASTRODUODENOSCOPY (EGD) WITH PROPOFOL BALLOON DILATION BIOPSY  Patient location during evaluation: Phase II Anesthesia Type: General Level of consciousness: awake and alert and oriented Pain management: pain level controlled Vital Signs Assessment: post-procedure vital signs reviewed and stable Respiratory status: spontaneous breathing, nonlabored ventilation and respiratory function stable Cardiovascular status: blood pressure returned to baseline and stable Postop Assessment: no apparent nausea or vomiting Anesthetic complications: no   No notable events documented.   Last Vitals:  Vitals:   05/28/22 0830 05/28/22 0925  BP:  (!) 152/75  Pulse: (!) 50 (!) 55  Resp: 14 16  Temp:  36.4 C  SpO2: 97% 98%    Last Pain:  Vitals:   05/28/22 0925  TempSrc: Oral  PainSc: 0-No pain                 Kaydenn Mclear C Cayson Kalb

## 2022-05-28 NOTE — Interval H&P Note (Signed)
History and Physical Interval Note:  05/28/2022 8:28 AM  Alexandra Price  has presented today for surgery, with the diagnosis of RUQ pain, dysphagia.  The various methods of treatment have been discussed with the patient and family. After consideration of risks, benefits and other options for treatment, the patient has consented to  Procedure(s) with comments: ESOPHAGOGASTRODUODENOSCOPY (EGD) WITH PROPOFOL (N/A) - 10:00am, asa 3 BALLOON DILATION (N/A) as a surgical intervention.  The patient's history has been reviewed, patient examined, no change in status, stable for surgery.  I have reviewed the patient's chart and labs.  Questions were answered to the patient's satisfaction.     Alexandra Price

## 2022-05-28 NOTE — Discharge Instructions (Addendum)
EGD Discharge instructions Please read the instructions outlined below and refer to this sheet in the next few weeks. These discharge instructions provide you with general information on caring for yourself after you leave the hospital. Your doctor may also give you specific instructions. While your treatment has been planned according to the most current medical practices available, unavoidable complications occasionally occur. If you have any problems or questions after discharge, please call your doctor. ACTIVITY You may resume your regular activity but move at a slower pace for the next 24 hours.  Take frequent rest periods for the next 24 hours.  Walking will help expel (get rid of) the air and reduce the bloated feeling in your abdomen.  No driving for 24 hours (because of the anesthesia (medicine) used during the test).  You may shower.  Do not sign any important legal documents or operate any machinery for 24 hours (because of the anesthesia used during the test).  NUTRITION Drink plenty of fluids.  You may resume your normal diet.  Begin with a light meal and progress to your normal diet.  Avoid alcoholic beverages for 24 hours or as instructed by your caregiver.  MEDICATIONS You may resume your normal medications unless your caregiver tells you otherwise.  WHAT YOU CAN EXPECT TODAY You may experience abdominal discomfort such as a feeling of fullness or "gas" pains.  FOLLOW-UP Your doctor will discuss the results of your test with you.  SEEK IMMEDIATE MEDICAL ATTENTION IF ANY OF THE FOLLOWING OCCUR: Excessive nausea (feeling sick to your stomach) and/or vomiting.  Severe abdominal pain and distention (swelling).  Trouble swallowing.  Temperature over 101 F (37.8 C).  Rectal bleeding or vomiting of blood.   Your EGD revealed mild amount inflammation in your stomach.  I took biopsies of this to rule out infection with a bacteria called H. pylori.  Await pathology results, my  office will contact you.  Continue on pantoprazole.  Your esophagus was wide open without any obvious strictures.  I did not stretch it today.  We may consider a study to look at your swallowing called a Modified barium swallow study.  We will discuss further in follow-up in 2 to 3 months in our clinic.   I hope you have a great rest of your week!  Alexandra Price. Abbey Chatters, D.O. Gastroenterology and Hepatology [facility]

## 2022-05-28 NOTE — Anesthesia Preprocedure Evaluation (Signed)
Anesthesia Evaluation  Patient identified by MRN, date of birth, ID band Patient awake    Reviewed: Allergy & Precautions, NPO status , Patient's Chart, lab work & pertinent test results  Airway Mallampati: II  TM Distance: >3 FB Neck ROM: Full    Dental  (+) Dental Advisory Given, Missing, Poor Dentition, Chipped   Pulmonary shortness of breath and with exertion, pneumonia, PE   Pulmonary exam normal breath sounds clear to auscultation       Cardiovascular Exercise Tolerance: Poor hypertension, Pt. on medications  Rhythm:Regular Rate:Bradycardia + Systolic murmurs    Neuro/Psych PSYCHIATRIC DISORDERS Anxiety Depression negative neurological ROS     GI/Hepatic Neg liver ROS, GERD  Medicated and Poorly Controlled,  Endo/Other  negative endocrine ROS  Renal/GU Renal InsufficiencyRenal disease  negative genitourinary   Musculoskeletal negative musculoskeletal ROS (+)   Abdominal   Peds negative pediatric ROS (+)  Hematology  (+) Blood dyscrasia (multiple myeloma), ,   Anesthesia Other Findings   Reproductive/Obstetrics negative OB ROS                            Anesthesia Physical Anesthesia Plan  ASA: 3  Anesthesia Plan: General   Post-op Pain Management: Minimal or no pain anticipated   Induction: Intravenous  PONV Risk Score and Plan: Propofol infusion  Airway Management Planned: Nasal Cannula and Natural Airway  Additional Equipment:   Intra-op Plan:   Post-operative Plan:   Informed Consent: I have reviewed the patients History and Physical, chart, labs and discussed the procedure including the risks, benefits and alternatives for the proposed anesthesia with the patient or authorized representative who has indicated his/her understanding and acceptance.     Dental advisory given  Plan Discussed with: CRNA and Surgeon  Anesthesia Plan Comments:          Anesthesia Quick Evaluation

## 2022-05-28 NOTE — Op Note (Signed)
[facility] Patient Name: Alexandra Price Procedure Date: 05/28/2022 8:59 AM MRN: [id number] Date of Birth: Feb 25, 1946 Attending MD: Elon Alas. Abbey Chatters DO CSN: [id number] Age: 76 Admit Type: Outpatient Procedure:                Upper GI endoscopy Indications:              Abdominal pain in the right upper quadrant,                            Dysphagia Providers:                Elon Alas. Abbey Chatters, DO, Caprice Kluver, Crystal Page,                            [place] Gene Technician, Technician Referring MD:              Medicines:                See the Anesthesia note for documentation of the                            administered medications Complications:            No immediate complications. Estimated Blood Loss:     Estimated blood loss was minimal. Procedure:                Pre-Anesthesia Assessment:                           - The anesthesia plan was to use monitored                            anesthesia care (MAC).                           After obtaining informed consent, the endoscope was                            passed under direct vision. Throughout the                            procedure, the patient's blood pressure, pulse, and                            oxygen saturations were monitored continuously. The                            [id number]-H190 ([id number]) scope was introduced through the                            mouth, and advanced to the second part of duodenum.                            The upper GI endoscopy was accomplished without                            difficulty. The patient tolerated  the procedure                            well. Scope In: 9:11:52 AM Scope Out: 9:14:27 AM Total Procedure Duration: 0 hours 2 minutes 35 seconds  Findings:      A small hiatal hernia was present.      There is no endoscopic evidence of areas of erosion, esophagitis,       ulcerations or varices in the entire esophagus.      Patchy mild inflammation characterized by erythema  was found in the       gastric antrum. Biopsies were taken with a cold forceps for Helicobacter       pylori testing.      The duodenal bulb, first portion of the duodenum and second portion of       the duodenum were normal. Impression:               - Small hiatal hernia.                           - Gastritis. Biopsied.                           - Normal duodenal bulb, first portion of the                            duodenum and second portion of the duodenum. Moderate Sedation:      Per Anesthesia Care Recommendation:           - Patient has a contact number available for                            emergencies. The signs and symptoms of potential                            delayed complications were discussed with the                            patient. Return to normal activities tomorrow.                            Written discharge instructions were provided to the                            patient.                           - Resume previous diet.                           - Continue present medications.                           - Await pathology results.                           - Use Protonix (pantoprazole) 40 mg PO daily.                           -  Return to GI clinic in 3 months.                           - Consider MBSS as patient's dysphagia appears more                            oropharyngeal Procedure Code(s):        --- Professional ---                           [phone], Esophagogastroduodenoscopy, flexible,                            transoral; with biopsy, single or multiple Diagnosis Code(s):        --- Professional ---                           K44.9, Diaphragmatic hernia without obstruction or                            gangrene                           K29.70, Gastritis, unspecified, without bleeding                           R10.11, Right upper quadrant pain                           R13.10, Dysphagia, unspecified CPT copyright 2019 American Medical  Association. All rights reserved. The codes documented in this report are preliminary and upon coder review may  be revised to meet current compliance requirements. Elon Alas. Abbey Chatters, DO Catherine Harrah, DO 05/28/2022 9:16:56 AM This report has been signed electronically. Number of Addenda: 0

## 2022-05-29 ENCOUNTER — Other Ambulatory Visit: Payer: Self-pay

## 2022-05-29 ENCOUNTER — Inpatient Hospital Stay: Payer: Medicare Other

## 2022-05-29 ENCOUNTER — Inpatient Hospital Stay (HOSPITAL_BASED_OUTPATIENT_CLINIC_OR_DEPARTMENT_OTHER): Payer: Medicare Other | Admitting: Hematology

## 2022-05-29 VITALS — BP 146/76 | HR 63 | Temp 97.9°F | Resp 18 | Wt 182.1 lb

## 2022-05-29 DIAGNOSIS — Z5112 Encounter for antineoplastic immunotherapy: Secondary | ICD-10-CM | POA: Diagnosis present

## 2022-05-29 DIAGNOSIS — Z79624 Long term (current) use of inhibitors of nucleotide synthesis: Secondary | ICD-10-CM | POA: Diagnosis not present

## 2022-05-29 DIAGNOSIS — F419 Anxiety disorder, unspecified: Secondary | ICD-10-CM | POA: Insufficient documentation

## 2022-05-29 DIAGNOSIS — Z510 Encounter for antineoplastic radiation therapy: Secondary | ICD-10-CM | POA: Diagnosis present

## 2022-05-29 DIAGNOSIS — C9000 Multiple myeloma not having achieved remission: Secondary | ICD-10-CM | POA: Insufficient documentation

## 2022-05-29 DIAGNOSIS — M545 Low back pain, unspecified: Secondary | ICD-10-CM | POA: Diagnosis not present

## 2022-05-29 DIAGNOSIS — Z86711 Personal history of pulmonary embolism: Secondary | ICD-10-CM | POA: Diagnosis not present

## 2022-05-29 DIAGNOSIS — Z7961 Long term (current) use of immunomodulator: Secondary | ICD-10-CM | POA: Insufficient documentation

## 2022-05-29 DIAGNOSIS — N189 Chronic kidney disease, unspecified: Secondary | ICD-10-CM | POA: Insufficient documentation

## 2022-05-29 DIAGNOSIS — D631 Anemia in chronic kidney disease: Secondary | ICD-10-CM | POA: Diagnosis not present

## 2022-05-29 DIAGNOSIS — Z9484 Stem cells transplant status: Secondary | ICD-10-CM | POA: Diagnosis not present

## 2022-05-29 DIAGNOSIS — C9001 Multiple myeloma in remission: Secondary | ICD-10-CM

## 2022-05-29 DIAGNOSIS — Z23 Encounter for immunization: Secondary | ICD-10-CM | POA: Diagnosis not present

## 2022-05-29 DIAGNOSIS — M79629 Pain in unspecified upper arm: Secondary | ICD-10-CM | POA: Diagnosis not present

## 2022-05-29 DIAGNOSIS — Z79899 Other long term (current) drug therapy: Secondary | ICD-10-CM | POA: Diagnosis not present

## 2022-05-29 DIAGNOSIS — Z7901 Long term (current) use of anticoagulants: Secondary | ICD-10-CM | POA: Diagnosis not present

## 2022-05-29 MED ORDER — INFLUENZA VAC A&B SA ADJ QUAD 0.5 ML IM PRSY
0.5000 mL | PREFILLED_SYRINGE | Freq: Once | INTRAMUSCULAR | Status: AC
  Administered 2022-05-29: 0.5 mL via INTRAMUSCULAR
  Filled 2022-05-29: qty 0.5

## 2022-05-29 MED ORDER — HYDROCODONE-ACETAMINOPHEN 10-325 MG PO TABS: 1.0000 | ORAL_TABLET | Freq: Four times a day (QID) | ORAL | 0 refills | Status: DC | PRN

## 2022-05-29 NOTE — Patient Instructions (Signed)
[facility]  Discharge Instructions: Thank you for choosing [facility] to provide your oncology and hematology care.  If you have a lab appointment with the Cancer Center, please come in thru the Main Entrance and check in at the main information desk.  Wear comfortable clothing and clothing appropriate for easy access to any Portacath or PICC line.   We strive to give you quality time with your provider. You may need to reschedule your appointment if you arrive late (15 or more minutes).  Arriving late affects you and other patients whose appointments are after yours.  Also, if you miss three or more appointments without notifying the office, you may be dismissed from the clinic at the provider's discretion.      For prescription refill requests, have your pharmacy contact our office and allow 72 hours for refills to be completed.     To help prevent nausea and vomiting after your treatment, we encourage you to take your nausea medication as directed.  BELOW ARE SYMPTOMS THAT SHOULD BE REPORTED IMMEDIATELY: *FEVER GREATER THAN 100.4 F (38 C) OR HIGHER *CHILLS OR SWEATING *NAUSEA AND VOMITING THAT IS NOT CONTROLLED WITH YOUR NAUSEA MEDICATION *UNUSUAL SHORTNESS OF BREATH *UNUSUAL BRUISING OR BLEEDING *URINARY PROBLEMS (pain or burning when urinating, or frequent urination) *BOWEL PROBLEMS (unusual diarrhea, constipation, pain near the anus) TENDERNESS IN MOUTH AND THROAT WITH OR WITHOUT PRESENCE OF ULCERS (sore throat, sores in mouth, or a toothache) UNUSUAL RASH, SWELLING OR PAIN  UNUSUAL VAGINAL DISCHARGE OR ITCHING   Items with * indicate a potential emergency and should be followed up as soon as possible or go to the Emergency Department if any problems should occur.  Please show the CHEMOTHERAPY ALERT CARD or IMMUNOTHERAPY ALERT CARD at check-in to the Emergency Department and triage nurse.  Should you have questions after your visit or need to  cancel or reschedule your appointment, please contact [facility] [phone]  and follow the prompts.  Office hours are 8:00 a.m. to 4:30 p.m. Monday - Friday. Please note that voicemails left after 4:00 p.m. may not be returned until the following business day.  We are closed weekends and major holidays. You have access to a nurse at all times for urgent questions. Please call the main number to the clinic [phone] and follow the prompts.  For any non-urgent questions, you may also contact your provider using MyChart. We now offer e-Visits for anyone 18 and older to request care online for non-urgent symptoms. For details visit [email or web address].   Also download the MyChart app! Go to the app store, search "MyChart", open the app, select [facility], and log in with your MyChart username and password.  Masks are optional in the cancer centers. If you would like for your care team to wear a mask while they are taking care of you, please let them know. You may have one support person who is at least 74 years old accompany you for your appointments.  

## 2022-05-29 NOTE — Progress Notes (Signed)
[facility] [address], [place] [address]   CLINIC:  Medical Oncology/Hematology  PCP:  [name], MD [address] [place] / [place] [address] [phone]   REASON FOR VISIT:  Follow-up for multiple myeloma  PRIOR THERAPY:  1. KPD x 4 cycles from 04/17/2018 to 08/21/2018. 2. Stem cell transplant on 10/23/2018. 3. Radiation to left and right humerus 25 Gy in 10 fractions from 08/23/2020 to 08/25/2020.  CURRENT THERAPY: Maintenance Pomalyst 3/4 weeks  BRIEF ONCOLOGIC HISTORY:  Oncology History  Multiple myeloma without remission ([place])  04/15/2018 Initial Diagnosis   Multiple myeloma without remission ([place])   04/17/2018 - 09/05/2018 Chemotherapy   The patient had dexamethasone (DECADRON) 4 MG tablet, 1 of 1 cycle, Start date: 04/15/2018, End date: 05/26/2018 palonosetron (ALOXI) injection 0.25 mg, 0.25 mg, Intravenous,  Once, 1 of 1 cycle Administration: 0.25 mg (04/17/2018), 0.25 mg (04/24/2018), 0.25 mg (05/01/2018) cyclophosphamide (CYTOXAN) 540 mg in sodium chloride 0.9 % 250 mL chemo infusion, 300 mg/m2 = 540 mg, Intravenous,  Once, 1 of 1 cycle Administration: 540 mg (04/17/2018), 540 mg (04/24/2018), 540 mg (05/01/2018) carfilzomib (KYPROLIS) 36 mg in dextrose 5 % 50 mL chemo infusion, 20 mg/m2 = 36 mg, Intravenous, Once, 5 of 5 cycles Administration: 36 mg (04/17/2018), 36 mg (04/18/2018), 60 mg (04/24/2018), 60 mg (04/25/2018), 60 mg (05/01/2018), 60 mg (05/02/2018), 60 mg (05/15/2018), 60 mg (05/16/2018), 60 mg (06/12/2018), 60 mg (06/13/2018), 60 mg (06/26/2018), 60 mg (06/27/2018), 60 mg (07/03/2018), 60 mg (07/04/2018), 60 mg (07/24/2018), 60 mg (07/25/2018), 60 mg (07/31/2018), 60 mg (08/01/2018), 60 mg (08/08/2018), 60 mg (08/07/2018), 60 mg (08/21/2018), 60 mg (08/22/2018), 60 mg (08/28/2018), 60 mg (08/29/2018), 60 mg (09/04/2018), 60 mg (09/05/2018)  for chemotherapy treatment.      CANCER STAGING:  Cancer Staging  No matching staging information was found for the  patient.  INTERVAL HISTORY:  [name], a 76 y.o. female, seen for follow-up of multiple myeloma.  She is taking hydrocodone 7.5/325 every 6 hours and is having to take ibuprofen 200 mg in between.  She is also using heating pad for the low back pain.  She has some numbness in the right leg shin region without any neuropathic pains.  Hydrocodone is an proving the pain from 9/10 to 4/10.  REVIEW OF SYSTEMS:  Review of Systems  Respiratory:  Positive for shortness of breath.   Gastrointestinal:  Positive for constipation and diarrhea.  Musculoskeletal:  Positive for back pain (4/10 L side - stable).  Neurological:  Positive for numbness (Right shin).  All other systems reviewed and are negative.   PAST MEDICAL/SURGICAL HISTORY:  Past Medical History:  Diagnosis Date   Anxiety    Chronic kidney disease    Depression    Hypertension    Multiple myeloma ([place])    multiple myeloma   Pre-diabetes    Past Surgical History:  Procedure Laterality Date   ABDOMINAL HYSTERECTOMY     total   APPENDECTOMY     COLONOSCOPY WITH PROPOFOL N/A 12/25/2021   Procedure: COLONOSCOPY WITH PROPOFOL;  Surgeon: [name], DO;  [facility];  Service: Endoscopy;  Laterality: N/A;  2:30pm   LAPAROSCOPIC APPENDECTOMY N/A 05/20/2018   Procedure: APPENDECTOMY LAPAROSCOPIC;  Surgeon: [name], MD;  Location: [facility];  Service: General;  Laterality: N/A;   POLYPECTOMY  12/25/2021   Procedure: POLYPECTOMY;  Surgeon: [name], DO;  Location: [facility];  Service: Endoscopy;;  PORTACATH PLACEMENT Right 04/14/2018   Procedure: INSERTION PORT-A-CATH;  Surgeon: [name], MD;  Location: [facility];  Service: General;  Laterality: Right;    SOCIAL HISTORY:  Social History   Socioeconomic History   Marital status: Legally Separated    Spouse name: Not on file   Number of children: 6   Years of education: Not on file   Highest education level: Not on file  Occupational  History    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  Tobacco Use   Smoking status: Never   Smokeless tobacco: Never  Vaping Use   Vaping Use: Never used  Substance and Sexual Activity   Alcohol use: Never   Drug use: Not Currently   Sexual activity: Not Currently  Other Topics Concern   Not on file  Social History Narrative   Not on file   Social Determinants of Health   Financial Resource Strain: Low Risk  (07/06/2020)   Overall Financial Resource Strain (CARDIA)    Difficulty of Paying Living Expenses: Not hard at all  Food Insecurity: No Food Insecurity (07/06/2020)   Hunger Vital Sign    Worried About Running Out of Food in the Last Year: Never true    Ran Out of Food in the Last Year: Never true  Transportation Needs: No Transportation Needs (07/06/2020)   PRAPARE - Hydrologist (Medical): No    Lack of Transportation (Non-Medical): No  Physical Activity: Inactive (07/06/2020)   Exercise Vital Sign    Days of Exercise per Week: 0 days    Minutes of Exercise per Session: 0 min  Stress: No Stress Concern Present (07/06/2020)   [place]    Feeling of Stress : Not at all  Social Connections: Moderately Isolated (07/06/2020)   Social Connection and Isolation Panel [NHANES]    Frequency of Communication with Friends and Family: More than three times a week    Frequency of Social Gatherings with Friends and Family: Twice a week    Attends Religious Services: 1 to 4 times per year    Active Member of [employer] or Organizations: No    Attends Archivist Meetings: Never    Marital Status: Separated  Intimate Partner Violence: Not At Risk (08/02/2020)   Humiliation, Afraid, Rape, and Kick questionnaire    Fear of Current or Ex-Partner: No    Emotionally Abused: No    Physically Abused: No    Sexually Abused: No    FAMILY HISTORY:  Family History  Problem Relation Age of Onset    Heart disease Mother    Emphysema Father    Diabetes Sister    Depression Sister    Cancer Brother        liver, lung, and colon. colon cancer at age 17.   Diabetes Brother     CURRENT MEDICATIONS:  Current Outpatient Medications  Medication Sig Dispense Refill   acyclovir (ZOVIRAX) 400 MG tablet Take 1 tablet (400 mg total) by mouth 2 (two) times daily. 60 tablet 6   ALPRAZolam (XANAX) 0.5 MG tablet TAKE 1 TABLET BY MOUTH DURING THE DAY AS NEEDED FOR ANXIETY AND 2 TABLETS AT BEDTIME AS NEEDED FOR ANXIETY/INSOMNIA 90 tablet 0   apixaban (ELIQUIS) 5 MG TABS tablet Take 5 mg by mouth 2 (two) times daily.     Calcium Carb-Cholecalciferol (CALCIUM 1000 + D PO) Take 1,000 mg by mouth daily.     clotrimazole-betamethasone (LOTRISONE) cream Apply 1 Application  topically 2 (two) times daily as needed (irritation).     escitalopram (LEXAPRO) 20 MG tablet Take 20 mg by mouth daily.     HYDROcodone-acetaminophen (NORCO) 7.5-325 MG tablet Take 1 tablet by mouth every 6 (six) hours as needed for moderate pain. 120 tablet 0   ibuprofen (ADVIL) 200 MG tablet Take 200 mg by mouth every 6 (six) hours as needed for moderate pain. 30 tablet 0   Multiple Vitamin (MULTI VITAMIN) TABS Take 1 tablet by mouth daily.     pantoprazole (PROTONIX) 40 MG tablet Take 1 tablet (40 mg total) by mouth daily. 90 tablet 6   polyethylene glycol powder (GLYCOLAX/MIRALAX) 17 GM/SCOOP powder Take one capful once to twice daily as needed (Patient taking differently: Take 17 g by mouth daily as needed for moderate constipation.) 255 g 3   pomalidomide (POMALYST) 2 MG capsule TAKE 1 CAPSULE BY MOUTH ONCE DAILY FOR 21 DAYS ON AND 7 DAYS OFF 21 capsule 0   rosuvastatin (CRESTOR) 10 MG tablet Take 10 mg by mouth daily.     zolpidem (AMBIEN) 10 MG tablet Take 1 tablet (10 mg total) by mouth at bedtime as needed for sleep. 30 tablet 2   No current facility-administered medications for this visit.    ALLERGIES:  Allergies   Allergen Reactions   Ciprofloxacin Anaphylaxis   Amoxicillin Other (See Comments)    unknown   Morphine And Related Nausea And Vomiting   Augmentin [Amoxicillin-Pot Clavulanate] Other (See Comments)    Headache, insomnia    PHYSICAL EXAM:  Performance status (ECOG): 1 - Symptomatic but completely ambulatory  There were no vitals filed for this visit.  Wt Readings from Last 3 Encounters:  05/25/22 180 lb (81.6 kg)  04/30/22 181 lb 12.8 oz (82.5 kg)  03/28/22 178 lb 9.6 oz (81 kg)   Physical Exam Vitals reviewed.  Constitutional:      Appearance: Normal appearance. She is obese.  Cardiovascular:     Rate and Rhythm: Normal rate and regular rhythm.     Pulses: Normal pulses.     Heart sounds: Normal heart sounds.  Pulmonary:     Effort: Pulmonary effort is normal.     Breath sounds: Normal breath sounds.  Neurological:     General: No focal deficit present.     Mental Status: She is alert and oriented to person, place, and time.  Psychiatric:        Mood and Affect: Mood normal.        Behavior: Behavior normal.      LABORATORY DATA:  I have reviewed the labs as listed.     Latest Ref Rng & Units 05/24/2022   10:01 AM 03/20/2022    9:42 AM 02/07/2022    2:40 PM  CBC  WBC 4.0 - 10.5 K/uL 3.0  2.6  3.2   Hemoglobin 12.0 - 15.0 g/dL 10.6  10.6  9.7   Hematocrit 36.0 - 46.0 % 32.4  32.1  29.2   Platelets 150 - 400 K/uL 139  125  122       Latest Ref Rng & Units 05/24/2022   10:01 AM 03/20/2022    9:42 AM 02/07/2022    2:40 PM  CMP  Glucose 70 - 99 mg/dL 149  164  128   BUN 8 - 23 mg/dL 31  25  22    Creatinine 0.44 - 1.00 mg/dL 1.58  1.55  1.45   Sodium 135 - 145 mmol/L 141  138  136  Potassium 3.5 - 5.1 mmol/L 4.3  4.1  4.2   Chloride 98 - 111 mmol/L 104  104  102   CO2 22 - 32 mmol/L 29  27  29    Calcium 8.9 - 10.3 mg/dL 9.1  9.0  9.6   Total Protein 6.5 - 8.1 g/dL 7.3  7.1  6.9   Total Bilirubin 0.3 - 1.2 mg/dL 0.8  0.8  0.6   Alkaline Phos 38 - 126 U/L 76   66  69   AST 15 - 41 U/L 24  26  21    ALT 0 - 44 U/L 30  31  22      DIAGNOSTIC IMAGING:  I have independently reviewed the scans and discussed with the patient. NM PET Image Restage (PS) Whole Body  Result Date: 05/27/2022 CLINICAL DATA:  Subsequent treatment strategy for multiple myeloma. EXAM: NUCLEAR MEDICINE PET WHOLE BODY TECHNIQUE: 9.07 mCi F-18 FDG was injected intravenously. Full-ring PET imaging was performed from the head to foot after the radiotracer. CT data was obtained and used for attenuation correction and anatomic localization. Fasting blood glucose: 154 mg/dl COMPARISON:  Prior PET CTs.  Most recent is 12/14/2021 FINDINGS: Mediastinal blood pool activity: SUV max 3.12 HEAD/NECK: No hypermetabolic activity in the scalp. No hypermetabolic cervical lymph nodes. Incidental CT findings: none CHEST: No hypermetabolic mediastinal or hilar nodes. No suspicious pulmonary nodules on the CT scan. Incidental CT findings: Stable right-sided Port-A-Cath. Minimal scattered atherosclerotic calcifications. ABDOMEN/PELVIS: No abnormal hypermetabolic activity within the liver, pancreas, adrenal glands, or spleen. No hypermetabolic lymph nodes in the abdomen or pelvis. Incidental CT findings: Diffuse and marked fatty infiltration of the liver. Stable atherosclerotic calcifications involving the aorta and branch vessels. SKELETON: There is a single lytic hypermetabolic lesion involving the left lamina of T11. There was a small lytic lesion here on the prior CT scan but it is larger and now hypermetabolic suggesting an active myelomatous process. However common no other hypermetabolic bone lesions are identified. Incidental CT findings: Stable diffuse lytic myelomatous lesions throughout the axial and appendicular skeleton. EXTREMITIES: No abnormal hypermetabolic activity in the lower extremities. Incidental CT findings: none IMPRESSION: 1. Single new hypermetabolic lytic myelomatous lesion involving the left  lamina of T11. No canal encroachment. No other hypermetabolic bone lesions are identified. Stable diffuse lytic myelomatous lesions throughout the axial and appendicular skeleton. 2. No significant findings involving the neck, chest, abdomen or pelvis. 3. Diffuse fatty infiltration of the liver. Electronically Signed   By: Marijo Sanes M.D.   On: 05/27/2022 12:52   US Abdomen Complete  Result Date: 05/05/2022 CLINICAL DATA:  Right upper quadrant pain EXAM: ABDOMEN ULTRASOUND COMPLETE COMPARISON:  12/14/21 FINDINGS: Gallbladder: No gallstones or wall thickening visualized. No sonographic Murphy sign noted by sonographer. Common bile duct: Diameter: 4.3 mm Liver: Increased in echogenicity consistent with fatty infiltration. No focal mass is noted. Portal vein is patent on color Doppler imaging with normal direction of blood flow towards the liver. IVC: No abnormality visualized. Pancreas: Visualized portion unremarkable. Spleen: Size and appearance within normal limits. Right Kidney: Length: 8.5 cm. Echogenicity within normal limits. No mass or hydronephrosis visualized. Left Kidney: Length: 9.0 cm. Echogenicity within normal limits. No mass or hydronephrosis visualized. Abdominal aorta: No aneurysm visualized. Other findings: None. IMPRESSION: Fatty liver. No other focal abnormality is noted. Electronically Signed   By: Inez Catalina M.D.   On: 05/05/2022 22:40     ASSESSMENT:  1.  IgG lambda plasma cell myeloma, stage II, standard risk: -  4 cycles of KPD from 04/17/2018 through 08/21/2018, stem cell transplant on 10/23/2018. -PET scan on 01/21/2019 showed multiple bone lesions but without any hypermetabolic activity. -BMBX on 01/21/2019 with normocellular marrow with no increase in plasma cells.  Normal FISH.  MRD results negative. -Maintenance pomalidomide 2 mg 3 weeks on/1 week off started on 03/10/2019. -Bone marrow biopsy on 10/29/2019 shows trilineage hematopoiesis with no evidence of plasma cells.  Chromosome  analysis and FISH are normal. -BM BX on 10/27/2020 at [facility] with 30 to 40%, TLH.  2% of the total cells are CD 138+ plasma cells. - Pomalyst was held due to dizziness from 03/29/2021 through 04/26/2021. - MRI of the brain on 03/31/2021 did not show any evidence of intracranial lesions.  Multiple bone lesions in the skull compatible with sequela of myeloma.  - [facility] on 11/16/2021: M spike 0.24 g.  Immunofixation positive for IgG lambda. - PET scan on 12/14/2021: No evidence of FDG avid osseous or soft tissue myeloma. - She has developed left shoulder blade and right posterior rib pain for the last 3 to 4 months. - 24-hour urine on 12/14/2021: Total protein 136 mg.  Immune immunofixation positive for lambda type Bence-Jones protein.  2.  Pulmonary embolism: -CT angiogram on 04/21/2020 showed filling defect at [facility]. -VQ scan confirmed pulmonary embolism. -She is on Eliquis.   3.  Right shoulder and upper arm pain: - She received XRT to the chest from 12/26/2020 through 01/06/2021.   PLAN:  1.  IgG lambda plasma cell myeloma: - She is taking Pomalyst 2 mg 3 weeks on/1 week off. - PET scan (05/24/2022): Single new hypermetabolic lytic lesion involving left lamina of T11.  No canal encroachment.  No other hypermetabolic bone lesions.  Stable diffuse lytic lesions throughout the axial and appendicular skeleton.  No soft tissue lesions. - Reviewed labs from 05/24/2022 which showed lambda light chains increased to 182 with ratio of 0.14.  M spike is 0.4 g.  Creatinine is 1.58 and calcium 9.1. - Due to worsening myeloma numbers and new PET scan finding, I have recommended change in therapy.  We will discontinue pomalidomide. - I will make a referral to radiation oncology for treatment of her T11 lesion. - We will change her to daratumumab, Velcade and dexamethasone regimen.  We discussed regimen in detail.  We have given literature about it.    2.  Back  pain/left shoulder blade pain/right lateral and posterior rib pains: - She is taking hydrocodone 7.5 mg 4 times daily and requiring ibuprofen. - We will increase hydrocodone to 10 mg every 6 hours.   3.  Myeloma bone disease: - Denosumab held due to exposure of the bone in the right lower jaw.  Continue calcium and vitamin D supplements.   4.  Anxiety: - Continue Xanax twice daily as needed.   5.  Sleeping difficulty: - Continue Ambien at bedtime as needed.   6.  Macrocytic anemia: - Combination anemia from CKD and myelosuppression from Pomalyst.  Ferritin is 435 and percent saturation 27.  Hemoglobin is 10.6.   7.  Pulmonary embolism: - Continue Eliquis.  No bleeding issues.   Orders placed this encounter:  No orders of the defined types were placed in this encounter.     [name], MD [facility] [phone]

## 2022-05-29 NOTE — Progress Notes (Signed)
Patient tolerated injection with no complaints voiced.  Site clean and dry with no bruising or swelling noted at site.  See MAR for details.  Band aid applied.  Patient stable during and after injection.  Vss with discharge and left in satisfactory condition with no s/s of distress noted.  

## 2022-05-29 NOTE — Progress Notes (Signed)
DISCONTINUE ON PATHWAY REGIMEN - Multiple Myeloma and Other Plasma Cell Dyscrasias     A cycle is every 28 days:     Carfilzomib      Carfilzomib      Carfilzomib      Dexamethasone      Dexamethasone      Lenalidomide   **Always confirm dose/schedule in your pharmacy ordering system**  REASON: Other Reason PRIOR TREATMENT: [id number]: KRd (Carfilzomib 20/36 mg/m2 + Lenalidomide 25 mg + Dexamethasone PO 40/20 mg) q28 Days x 4-8 Cycles TREATMENT RESPONSE: Complete Response (CR)  START ON PATHWAY REGIMEN - Multiple Myeloma and Other Plasma Cell Dyscrasias     Cycles 1 through 3: A cycle is every 21 days:     Dexamethasone      Bortezomib      Daratumumab and hyaluronidase-fihj    Cycles 4 through 8: A cycle is every 21 days:     Dexamethasone      Bortezomib      Daratumumab and hyaluronidase-fihj    Cycles 9 and beyond: A cycle is every 28 days:     Daratumumab and hyaluronidase-fihj   **Always confirm dose/schedule in your pharmacy ordering system**  Patient Characteristics: Multiple Myeloma, Relapsed / Refractory, Second through Fourth Lines of Therapy, Fit or Candidate for Triplet Therapy, Lenalidomide-Refractory or Lenalidomide-based Regimen Not Preferred, Candidate for Anti-CD38 Antibody Disease Classification: Multiple Myeloma R-ISS Staging: II Therapeutic Status: Relapsed Line of Therapy: Second Line Anti-CD38 Antibody Candidacy: Candidate for Anti-CD38 Antibody Lenalidomide-based Regimen Preference/Candidacy: Lenalidomide-Refractory Intent of Therapy: Non-Curative / Palliative Intent, Discussed with Patient

## 2022-05-29 NOTE — Patient Instructions (Addendum)
[facility]  Discharge Instructions  You were seen and examined today by Dr. Delton Coombes.  Dr. Delton Coombes has reviewed your most recent PET scan and lab work. Your M-Spike is currently 0.4. The PET scan revealed one new bone lesion in one of your vertebrae.  Dr. Delton Coombes has recommended changing your myeloma treatment. Stop Pomalyst now. Dr. Delton Coombes has recommended a multi-drug regimen known as Darzalex and Velcade. These are both injections given in your abdomen. You will be seen here once weekly for those.   For the new bone lesion, Dr. Delton Coombes will refer you to radiation for management.  Dr. Delton Coombes will increase your pain medication to Hydrocodone-Acetaminophen 10/'325mg'$  every 6 hours.  Follow-up as scheduled.  Thank you for choosing [facility] to provide your oncology and hematology care.   To afford each patient quality time with our provider, please arrive at least 15 minutes before your scheduled appointment time. You may need to reschedule your appointment if you arrive late (10 or more minutes). Arriving late affects you and other patients whose appointments are after yours.  Also, if you miss three or more appointments without notifying the office, you may be dismissed from the clinic at the provider's discretion.    Again, thank you for choosing [facility].  Our hope is that these requests will decrease the amount of time that you wait before being seen by our physicians.   If you have a lab appointment with the [facility] please come in thru the Main Entrance and check in at the main information desk.           _____________________________________________________________  Should you have questions after your visit to [facility], please contact our office at [phone] and follow the prompts.  Our office hours are 8:00 a.m. to 4:30 p.m. Monday - Thursday and 8:00 a.m. to 2:30  p.m. Friday.  Please note that voicemails left after 4:00 p.m. may not be returned until the following business day.  We are closed weekends and all major holidays.  You do have access to a nurse 24-7, just call the main number to the clinic [phone] and do not press any options, hold on the line and a nurse will answer the phone.    For prescription refill requests, have your pharmacy contact our office and allow 72 hours.    Masks are optional in the cancer centers. If you would like for your care team to wear a mask while they are taking care of you, please let them know. You may have one support person who is at least 74 years old accompany you for your appointments.

## 2022-05-30 ENCOUNTER — Other Ambulatory Visit: Payer: Self-pay

## 2022-05-30 ENCOUNTER — Telehealth: Payer: Self-pay | Admitting: *Deleted

## 2022-05-30 ENCOUNTER — Other Ambulatory Visit: Payer: Self-pay | Admitting: *Deleted

## 2022-05-30 LAB — IMMUNOFIXATION ELECTROPHORESIS
IgA: 92 mg/dL (ref 64–422)
IgG (Immunoglobin G), Serum: 1064 mg/dL (ref 586–1602)
IgM (Immunoglobulin M), Srm: 36 mg/dL (ref 26–217)
Total Protein ELP: 6.5 g/dL (ref 6.0–8.5)

## 2022-05-30 MED ORDER — DEXAMETHASONE 20 MG PO TABS: 1.0000 | ORAL_TABLET | ORAL | 2 refills | Status: DC

## 2022-05-30 MED ORDER — MONTELUKAST SODIUM 10 MG PO TABS: 10.0000 mg | ORAL_TABLET | ORAL | 2 refills | Status: DC

## 2022-05-30 NOTE — Telephone Encounter (Signed)
Patient called and states that she would like to take her pre meds prior to treatment at home to decrease time in clinic.  She will take Tylenol 650 mg, Benadryl 50 mg, Singulair 10 mg and Dexamethasone 20 mg.  Verbalized understanding.

## 2022-05-31 ENCOUNTER — Other Ambulatory Visit: Payer: Self-pay

## 2022-05-31 ENCOUNTER — Encounter: Payer: Self-pay | Admitting: Radiation Oncology

## 2022-05-31 ENCOUNTER — Ambulatory Visit
Admission: RE | Admit: 2022-05-31 | Discharge: 2022-05-31 | Disposition: A | Discharge status: Home / Self Care | Payer: Medicare Other | Source: Ambulatory Visit | Attending: Radiation Oncology | Admitting: Radiation Oncology

## 2022-05-31 VITALS — BP 139/64 | HR 60 | Temp 97.8°F | Resp 18 | Ht 64.0 in | Wt 182.2 lb

## 2022-05-31 DIAGNOSIS — K760 Fatty (change of) liver, not elsewhere classified: Secondary | ICD-10-CM | POA: Insufficient documentation

## 2022-05-31 DIAGNOSIS — N189 Chronic kidney disease, unspecified: Secondary | ICD-10-CM | POA: Diagnosis not present

## 2022-05-31 DIAGNOSIS — Z800 Family history of malignant neoplasm of digestive organs: Secondary | ICD-10-CM | POA: Diagnosis not present

## 2022-05-31 DIAGNOSIS — C9000 Multiple myeloma not having achieved remission: Secondary | ICD-10-CM | POA: Diagnosis present

## 2022-05-31 DIAGNOSIS — I129 Hypertensive chronic kidney disease with stage 1 through stage 4 chronic kidney disease, or unspecified chronic kidney disease: Secondary | ICD-10-CM | POA: Insufficient documentation

## 2022-05-31 DIAGNOSIS — Z923 Personal history of irradiation: Secondary | ICD-10-CM | POA: Insufficient documentation

## 2022-05-31 DIAGNOSIS — Z9484 Stem cells transplant status: Secondary | ICD-10-CM | POA: Diagnosis not present

## 2022-05-31 DIAGNOSIS — Z7961 Long term (current) use of immunomodulator: Secondary | ICD-10-CM | POA: Diagnosis not present

## 2022-05-31 DIAGNOSIS — Z79899 Other long term (current) drug therapy: Secondary | ICD-10-CM | POA: Diagnosis not present

## 2022-05-31 DIAGNOSIS — Z510 Encounter for antineoplastic radiation therapy: Secondary | ICD-10-CM | POA: Diagnosis not present

## 2022-05-31 DIAGNOSIS — Z801 Family history of malignant neoplasm of trachea, bronchus and lung: Secondary | ICD-10-CM | POA: Insufficient documentation

## 2022-05-31 LAB — SURGICAL PATHOLOGY

## 2022-05-31 NOTE — Progress Notes (Signed)
Reconsult nursing appointment for 76 year old patient w/ Multiple Myeloma not having achieved remission.   Patient reports occasional pain at sight on lower back 8/10. No other related concerns reported at this time.  Meaningful use complete. NO chances of pregnancy.  BP 139/64 (BP Location: Left Arm, Patient Position: Sitting, Cuff Size: Normal)   Pulse 60   Temp 97.8 F (36.6 C) (Oral)   Resp 18   Ht 5' 4"  (1.626 m)   Wt 182 lb 4 oz (82.7 kg)   SpO2 97%   BMI 31.28 kg/m

## 2022-05-31 NOTE — Progress Notes (Signed)
Radiation Oncology         ([phone]) [phone] ________________________________  Outpatient Reconsultation   Name: Alexandra Price        MRN: [id number]  Date of Service: 05/31/2022 DOB: 05-01-1946  KD:[id number], Langley Gauss, MD  [name], MD     REFERRING PHYSICIAN: [name], MD   DIAGNOSIS: The encounter diagnosis was Multiple myeloma without remission ([place]).   HISTORY OF PRESENT ILLNESS: Alexandra Price is a 76 y.o. female seen at the request of Dr. Delton Coombes for a history of multiple myeloma, the patient was originally diagnosed in the summer 2019, and received induction chemotherapy followed by stem cell transplant on 10/23/2018 at [facility].  She received palliative radiotherapy to bilateral shoulders for progressive disease which she completed in January of 2022, and palliative radiotherapy to the sternum in May of 2022.  She's recently been found to have progression and will start infusion therapy but on her PET scan on 05/24/22 showed hypermetabolic activity in the lateral left lamina. No other areas of disease were noted. She's seen to discuss treatment for the T11 lesion.    PREVIOUS RADIATION THERAPY:   12/26/2020 through 01/06/2021 Site Technique Total Dose (Gy) Dose per Fx (Gy) Completed Fx Beam Energies  [place]: Chest Complex 25/25 2.5 10/10 6X, 10X    08/09/20 - 08/25/20: 1.  The right shoulder was treated to a dose of 25 Gray in 10 fractions using a 2 field isodose plan. 2.  The left shoulder was treated to a dose of 25 Gray in 10 fractions using a 2 field isodose plan   PAST MEDICAL HISTORY:  Past Medical History:  Diagnosis Date   Anxiety    Chronic kidney disease    Depression    Hypertension    Multiple myeloma ([place])    multiple myeloma   Pre-diabetes        PAST SURGICAL HISTORY: Past Surgical History:  Procedure Laterality Date   ABDOMINAL HYSTERECTOMY     total   APPENDECTOMY     COLONOSCOPY WITH PROPOFOL N/A 12/25/2021   Procedure: COLONOSCOPY  WITH PROPOFOL;  Surgeon: [name], DO;  [facility];  Service: Endoscopy;  Laterality: N/A;  2:30pm   LAPAROSCOPIC APPENDECTOMY N/A 05/20/2018   Procedure: APPENDECTOMY LAPAROSCOPIC;  Surgeon: [name], MD;  Location: [facility] ORS;  Service: General;  Laterality: N/A;   POLYPECTOMY  12/25/2021   Procedure: POLYPECTOMY;  Surgeon: [name], DO;  Location: [facility];  Service: Endoscopy;;   PORTACATH PLACEMENT Right 04/14/2018   Procedure: INSERTION PORT-A-CATH;  Surgeon: [name], MD;  Location: [facility] ORS;  Service: General;  Laterality: Right;     FAMILY HISTORY:  Family History  Problem Relation Age of Onset   Heart disease Mother    Emphysema Father    Diabetes Sister    Depression Sister    Cancer Brother        liver, lung, and colon. colon cancer at age 60.   Diabetes Brother      SOCIAL HISTORY:  reports that she has never smoked. She has never used smokeless tobacco. She reports that she does not currently use drugs. She reports that she does not drink alcohol. The patient is separated and lives in [place] in [place], [place].    ALLERGIES: Ciprofloxacin, Amoxicillin, Morphine and related, and Augmentin [amoxicillin-pot clavulanate]   MEDICATIONS:  Current Outpatient Medications  Medication Sig Dispense Refill   acyclovir (ZOVIRAX) 400 MG tablet Take 1 tablet (400 mg total)  by mouth 2 (two) times daily. 60 tablet 6   ALPRAZolam (XANAX) 0.5 MG tablet TAKE 1 TABLET BY MOUTH DURING THE DAY AS NEEDED FOR ANXIETY AND 2 TABLETS AT BEDTIME AS NEEDED FOR ANXIETY/INSOMNIA 90 tablet 0   apixaban (ELIQUIS) 5 MG TABS tablet Take 5 mg by mouth 2 (two) times daily.     Calcium Carb-Cholecalciferol (CALCIUM 1000 + D PO) Take 1,000 mg by mouth daily.     clotrimazole-betamethasone (LOTRISONE) cream Apply 1 Application topically 2 (two) times daily as needed (irritation).     dexAMETHasone 20 MG TABS Take 1 tablet by mouth as directed. Take 1 tablet 30  minutes prior to infusions 30 tablet 2   escitalopram (LEXAPRO) 20 MG tablet Take 20 mg by mouth daily.     HYDROcodone-acetaminophen (NORCO) 10-325 MG tablet Take 1 tablet by mouth every 6 (six) hours as needed. 120 tablet 0   ibuprofen (ADVIL) 200 MG tablet Take 200 mg by mouth every 6 (six) hours as needed for moderate pain. 30 tablet 0   montelukast (SINGULAIR) 10 MG tablet Take 1 tablet (10 mg total) by mouth as directed. Take 1 tablet 30 minutes prior to treatment 30 tablet 2   Multiple Vitamin (MULTI VITAMIN) TABS Take 1 tablet by mouth daily.     pantoprazole (PROTONIX) 40 MG tablet Take 1 tablet (40 mg total) by mouth daily. 90 tablet 6   polyethylene glycol powder (GLYCOLAX/MIRALAX) 17 GM/SCOOP powder Take one capful once to twice daily as needed (Patient taking differently: Take 17 g by mouth daily as needed for moderate constipation.) 255 g 3   pomalidomide (POMALYST) 2 MG capsule TAKE 1 CAPSULE BY MOUTH ONCE DAILY FOR 21 DAYS ON AND 7 DAYS OFF 21 capsule 0   rosuvastatin (CRESTOR) 10 MG tablet Take 10 mg by mouth daily.     zolpidem (AMBIEN) 10 MG tablet Take 1 tablet (10 mg total) by mouth at bedtime as needed for sleep. 30 tablet 2   No current facility-administered medications for this encounter.     REVIEW OF SYSTEMS: On review of systems, the patient is doing okay. She has some pain in her mid-low back that radiates laterally and anteriorly. She has some burning and tingling quality that is worse at night time. She has pain medication that helps somewhat. She also continues to have left shoulder pain and cramping in her legs more on the right than left, with intermittent swelling at times that have been going on for about 2-3 months. No shortness of breath is noted.   PHYSICAL EXAM:  Wt Readings from Last 3 Encounters:  05/31/22 182 lb 4 oz (82.7 kg)  05/29/22 182 lb 1.6 oz (82.6 kg)  05/25/22 180 lb (81.6 kg)   Temp Readings from Last 3 Encounters:  05/31/22 97.8 F (36.6  C) (Oral)  05/29/22 97.9 F (36.6 C) (Tympanic)  05/28/22 97.6 F (36.4 C) (Oral)   BP Readings from Last 3 Encounters:  05/31/22 139/64  05/29/22 (!) 146/76  05/28/22 (!) 152/75   Pulse Readings from Last 3 Encounters:  05/31/22 60  05/29/22 63  05/28/22 (!) 55   In general this is a well appearing caucasian female in no acute distress. She's alert and oriented x4 and appropriate throughout the examination. Cardiopulmonary assessment is negative for acute distress and she exhibits normal effort. Lower extremities are dry, warm, and no erythema or edema is noted bilaterally.     ECOG = 1  0 - Asymptomatic (Fully active,  able to carry on all predisease activities without restriction)  1 - Symptomatic but completely ambulatory (Restricted in physically strenuous activity but ambulatory and able to carry out work of a light or sedentary nature. For example, light housework, office work)  2 - Symptomatic, <50% in bed during the day (Ambulatory and capable of all self care but unable to carry out any work activities. Up and about more than 50% of waking hours)  3 - Symptomatic, >50% in bed, but not bedbound (Capable of only limited self-care, confined to bed or chair 50% or more of waking hours)  4 - Bedbound (Completely disabled. Cannot carry on any self-care. Totally confined to bed or chair)  5 - Death   Eustace Pen MM, Creech RH, Tormey DC, et al. [phone]). "Toxicity and response criteria of the [facility]". [place]berg Oncol. 5 (6): 649-55    LABORATORY DATA:  Lab Results  Component Value Date   WBC 3.0 (L) 05/24/2022   HGB 10.6 (L) 05/24/2022   HCT 32.4 (L) 05/24/2022   MCV 101.9 (H) 05/24/2022   PLT 139 (L) 05/24/2022   Lab Results  Component Value Date   NA 141 05/24/2022   K 4.3 05/24/2022   CL 104 05/24/2022   CO2 29 05/24/2022   Lab Results  Component Value Date   ALT 30 05/24/2022   AST 24 05/24/2022   ALKPHOS 76 05/24/2022   BILITOT  0.8 05/24/2022      RADIOGRAPHY: NM PET Image Restage (PS) Whole Body  Result Date: 05/27/2022 CLINICAL DATA:  Subsequent treatment strategy for multiple myeloma. EXAM: NUCLEAR MEDICINE PET WHOLE BODY TECHNIQUE: 9.07 mCi F-18 FDG was injected intravenously. Full-ring PET imaging was performed from the head to foot after the radiotracer. CT data was obtained and used for attenuation correction and anatomic localization. Fasting blood glucose: 154 mg/dl COMPARISON:  Prior PET CTs.  Most recent is 12/14/2021 FINDINGS: Mediastinal blood pool activity: SUV max 3.12 HEAD/NECK: No hypermetabolic activity in the scalp. No hypermetabolic cervical lymph nodes. Incidental CT findings: none CHEST: No hypermetabolic mediastinal or hilar nodes. No suspicious pulmonary nodules on the CT scan. Incidental CT findings: Stable right-sided Port-A-Cath. Minimal scattered atherosclerotic calcifications. ABDOMEN/PELVIS: No abnormal hypermetabolic activity within the liver, pancreas, adrenal glands, or spleen. No hypermetabolic lymph nodes in the abdomen or pelvis. Incidental CT findings: Diffuse and marked fatty infiltration of the liver. Stable atherosclerotic calcifications involving the aorta and branch vessels. SKELETON: There is a single lytic hypermetabolic lesion involving the left lamina of T11. There was a small lytic lesion here on the prior CT scan but it is larger and now hypermetabolic suggesting an active myelomatous process. However common no other hypermetabolic bone lesions are identified. Incidental CT findings: Stable diffuse lytic myelomatous lesions throughout the axial and appendicular skeleton. EXTREMITIES: No abnormal hypermetabolic activity in the lower extremities. Incidental CT findings: none IMPRESSION: 1. Single new hypermetabolic lytic myelomatous lesion involving the left lamina of T11. No canal encroachment. No other hypermetabolic bone lesions are identified. Stable diffuse lytic myelomatous lesions  throughout the axial and appendicular skeleton. 2. No significant findings involving the neck, chest, abdomen or pelvis. 3. Diffuse fatty infiltration of the liver. Electronically Signed   By: Marijo Sanes M.D.   On: 05/27/2022 12:52   US Abdomen Complete  Result Date: 05/05/2022 CLINICAL DATA:  Right upper quadrant pain EXAM: ABDOMEN ULTRASOUND COMPLETE COMPARISON:  12/14/21 FINDINGS: Gallbladder: No gallstones or wall thickening visualized. No sonographic Murphy sign noted by sonographer.  Common bile duct: Diameter: 4.3 mm Liver: Increased in echogenicity consistent with fatty infiltration. No focal mass is noted. Portal vein is patent on color Doppler imaging with normal direction of blood flow towards the liver. IVC: No abnormality visualized. Pancreas: Visualized portion unremarkable. Spleen: Size and appearance within normal limits. Right Kidney: Length: 8.5 cm. Echogenicity within normal limits. No mass or hydronephrosis visualized. Left Kidney: Length: 9.0 cm. Echogenicity within normal limits. No mass or hydronephrosis visualized. Abdominal aorta: No aneurysm visualized. Other findings: None. IMPRESSION: Fatty liver. No other focal abnormality is noted. Electronically Signed   By: Inez Catalina M.D.   On: 05/05/2022 22:40        IMPRESSION/PLAN: 1. Multiple Myeloma not having achieved remission. Dr. Lisbeth Renshaw discusses the imaging findings and reviews the rationale for palliative radiotherapy to the T11 spine. We discussed the risks, benefits, short, and long term effects of radiotherapy, a well as the palliative intent, and the patient is interested in proceeding. Dr. Lisbeth Renshaw discusses the delivery and logistics of radiotherapy and anticipates a course of 2 weeks of radiotherapy to the T11 vertebral body. Written consent is obtained and placed in the chart, a copy was provided to the patient. She will simulate today and begin her infusion therapy with Dr. Delton Coombes as scheduled.  In a visit lasting 45  minutes, greater than 50% of the time was spent face to face discussing the patient's condition, in preparation for the discussion, and coordinating the patient's care.   The above documentation reflects my direct findings during this shared patient visit. Please see the separate note by Dr. Lisbeth Renshaw on this date for the remainder of the patient's plan of care.    Carola Rhine, PAC

## 2022-06-01 ENCOUNTER — Encounter (HOSPITAL_COMMUNITY): Payer: Self-pay | Admitting: Internal Medicine

## 2022-06-05 DIAGNOSIS — Z510 Encounter for antineoplastic radiation therapy: Secondary | ICD-10-CM | POA: Diagnosis not present

## 2022-06-07 ENCOUNTER — Other Ambulatory Visit: Payer: Self-pay

## 2022-06-07 ENCOUNTER — Other Ambulatory Visit: Payer: Self-pay | Admitting: Hematology

## 2022-06-07 ENCOUNTER — Ambulatory Visit
Admission: RE | Admit: 2022-06-07 | Discharge: 2022-06-07 | Disposition: A | Discharge status: Home / Self Care | Payer: Medicare Other | Source: Ambulatory Visit | Attending: Radiation Oncology | Admitting: Radiation Oncology

## 2022-06-07 DIAGNOSIS — Z510 Encounter for antineoplastic radiation therapy: Secondary | ICD-10-CM | POA: Diagnosis not present

## 2022-06-07 DIAGNOSIS — C9000 Multiple myeloma not having achieved remission: Secondary | ICD-10-CM

## 2022-06-07 LAB — RAD ONC ARIA SESSION SUMMARY
Course Elapsed Days: 0
Plan Fractions Treated to Date: 1
Plan Prescribed Dose Per Fraction: 2.5 Gy
Plan Total Fractions Prescribed: 10
Plan Total Prescribed Dose: 25 Gy
Reference Point Dosage Given to Date: 2.5 Gy
Reference Point Session Dosage Given: 2.5 Gy
Session Number: 1

## 2022-06-08 ENCOUNTER — Ambulatory Visit
Admission: RE | Admit: 2022-06-08 | Discharge: 2022-06-08 | Disposition: A | Discharge status: Home / Self Care | Payer: Medicare Other | Source: Ambulatory Visit | Attending: Radiation Oncology | Admitting: Radiation Oncology

## 2022-06-08 ENCOUNTER — Other Ambulatory Visit: Payer: Self-pay

## 2022-06-08 DIAGNOSIS — Z510 Encounter for antineoplastic radiation therapy: Secondary | ICD-10-CM | POA: Diagnosis not present

## 2022-06-08 LAB — RAD ONC ARIA SESSION SUMMARY
Course Elapsed Days: 1
Plan Fractions Treated to Date: 2
Plan Prescribed Dose Per Fraction: 2.5 Gy
Plan Total Fractions Prescribed: 10
Plan Total Prescribed Dose: 25 Gy
Reference Point Dosage Given to Date: 5 Gy
Reference Point Session Dosage Given: 2.5 Gy
Session Number: 2

## 2022-06-11 ENCOUNTER — Inpatient Hospital Stay: Payer: Medicare Other

## 2022-06-11 ENCOUNTER — Ambulatory Visit
Admission: RE | Admit: 2022-06-11 | Discharge: 2022-06-11 | Disposition: A | Discharge status: Home / Self Care | Payer: Medicare Other | Source: Ambulatory Visit | Attending: Radiation Oncology | Admitting: Radiation Oncology

## 2022-06-11 ENCOUNTER — Other Ambulatory Visit: Payer: Self-pay

## 2022-06-11 VITALS — BP 166/79 | HR 68 | Temp 96.2°F | Resp 20 | Wt 181.4 lb

## 2022-06-11 DIAGNOSIS — C9000 Multiple myeloma not having achieved remission: Secondary | ICD-10-CM

## 2022-06-11 DIAGNOSIS — Z510 Encounter for antineoplastic radiation therapy: Secondary | ICD-10-CM | POA: Diagnosis not present

## 2022-06-11 LAB — RAD ONC ARIA SESSION SUMMARY
Course Elapsed Days: 4
Plan Fractions Treated to Date: 3
Plan Prescribed Dose Per Fraction: 2.5 Gy
Plan Total Fractions Prescribed: 10
Plan Total Prescribed Dose: 25 Gy
Reference Point Dosage Given to Date: 7.5 Gy
Reference Point Session Dosage Given: 2.5 Gy
Session Number: 3

## 2022-06-11 LAB — COMPREHENSIVE METABOLIC PANEL
ALT: 30 U/L (ref 0–44)
AST: 25 U/L (ref 15–41)
Albumin: 3.8 g/dL (ref 3.5–5.0)
Alkaline Phosphatase: 72 U/L (ref 38–126)
Anion gap: 8 (ref 5–15)
BUN: 23 mg/dL (ref 8–23)
CO2: 28 mmol/L (ref 22–32)
Calcium: 9.3 mg/dL (ref 8.9–10.3)
Chloride: 103 mmol/L (ref 98–111)
Creatinine, Ser: 1.42 mg/dL — ABNORMAL HIGH (ref 0.44–1.00)
GFR, Estimated: 39 mL/min — ABNORMAL LOW (ref >60)
Glucose, Bld: 176 mg/dL — ABNORMAL HIGH (ref 70–99)
Potassium: 4 mmol/L (ref 3.5–5.1)
Sodium: 139 mmol/L (ref 135–145)
Total Bilirubin: 0.6 mg/dL (ref 0.3–1.2)
Total Protein: 6.9 g/dL (ref 6.5–8.1)

## 2022-06-11 LAB — CBC WITH DIFFERENTIAL/PLATELET
Abs Immature Granulocytes: 0 10*3/uL (ref 0.00–0.07)
Basophils Absolute: 0 10*3/uL (ref 0.0–0.1)
Basophils Relative: 2 %
Eosinophils Absolute: 0.3 10*3/uL (ref 0.0–0.5)
Eosinophils Relative: 10 %
HCT: 31.5 % — ABNORMAL LOW (ref 36.0–46.0)
Hemoglobin: 10.1 g/dL — ABNORMAL LOW (ref 12.0–15.0)
Immature Granulocytes: 0 %
Lymphocytes Relative: 30 %
Lymphs Abs: 0.8 10*3/uL (ref 0.7–4.0)
MCH: 32.4 pg (ref 26.0–34.0)
MCHC: 32.1 g/dL (ref 30.0–36.0)
MCV: 101 fL — ABNORMAL HIGH (ref 80.0–100.0)
Monocytes Absolute: 0.3 10*3/uL (ref 0.1–1.0)
Monocytes Relative: 12 %
Neutro Abs: 1.2 10*3/uL — ABNORMAL LOW (ref 1.7–7.7)
Neutrophils Relative %: 46 %
Platelets: 137 10*3/uL — ABNORMAL LOW (ref 150–400)
RBC: 3.12 MIL/uL — ABNORMAL LOW (ref 3.87–5.11)
RDW: 13.2 % (ref 11.5–15.5)
WBC: 2.6 10*3/uL — ABNORMAL LOW (ref 4.0–10.5)
nRBC: 0 % (ref 0.0–0.2)

## 2022-06-11 LAB — TYPE AND SCREEN
ABO/RH(D): A NEG
Antibody Screen: NEGATIVE

## 2022-06-11 MED ORDER — DARATUMUMAB-HYALURONIDASE-FIHJ 1800-30000 MG-UT/15ML ~~LOC~~ SOLN
1800.0000 mg | Freq: Once | SUBCUTANEOUS | Status: AC
  Administered 2022-06-11: 1800 mg via SUBCUTANEOUS
  Filled 2022-06-11: qty 15

## 2022-06-11 MED ORDER — SODIUM CHLORIDE 0.9% FLUSH
10.0000 mL | INTRAVENOUS | Status: DC | PRN
  Administered 2022-06-11: 10 mL via INTRAVENOUS

## 2022-06-11 MED ORDER — BORTEZOMIB CHEMO SQ INJECTION 3.5 MG (2.5MG/ML)
1.3000 mg/m2 | Freq: Once | INTRAMUSCULAR | Status: AC
  Administered 2022-06-11: 2.5 mg via SUBCUTANEOUS
  Filled 2022-06-11: qty 1

## 2022-06-11 MED ORDER — HEPARIN SOD (PORK) LOCK FLUSH 100 UNIT/ML IV SOLN
500.0000 [IU] | Freq: Once | INTRAVENOUS | Status: AC
  Administered 2022-06-11: 500 [IU] via INTRAVENOUS

## 2022-06-11 NOTE — Patient Instructions (Signed)
[facility]  Discharge Instructions: Thank you for choosing [facility] to provide your oncology and hematology care.  If you have a lab appointment with the [facility], please come in thru the Main Entrance and check in at the main information desk.  Wear comfortable clothing and clothing appropriate for easy access to any Portacath or PICC line.   We strive to give you quality time with your provider. You may need to reschedule your appointment if you arrive late (15 or more minutes).  Arriving late affects you and other patients whose appointments are after yours.  Also, if you miss three or more appointments without notifying the office, you may be dismissed from the clinic at the provider's discretion.      For prescription refill requests, have your pharmacy contact our office and allow 72 hours for refills to be completed.    Today you received the following chemotherapy and/or immunotherapy agents Daratumumab and Velcade.    To help prevent nausea and vomiting after your treatment, we encourage you to take your nausea medication as directed.  Daratumumab; Hyaluronidase Injection What is this medication? DARATUMUMAB; HYALURONIDASE (dar a toom ue mab; hye al ur ON i dase) treats multiple myeloma, a type of bone marrow cancer. Daratumumab works by blocking a protein that causes cancer cells to grow and multiply. This helps to slow or stop the spread of cancer cells. Hyaluronidase works by increasing the absorption of other medications in the body to help them work better. This medication may also be used treat amyloidosis, a condition that causes the buildup of a protein (amyloid) in your body. It works by reducing the buildup of this protein, which decreases symptoms. It is a combination medication that contains a monoclonal antibody. This medicine may be used for other purposes; ask your health care provider or pharmacist if you have questions. COMMON  BRAND NAME(S): DARZALEX FASPRO What should I tell my care team before I take this medication? They need to know if you have any of these conditions: Heart disease Infection, such as chickenpox, cold sores, herpes, hepatitis B Lung or breathing disease An unusual or allergic reaction to daratumumab, hyaluronidase, other medications, foods, dyes, or preservatives Pregnant or trying to get pregnant Breast-feeding How should I use this medication? This medication is injected under the skin. It is given by your care team in a hospital or clinic setting. Talk to your care team about the use of this medication in children. Special care may be needed. Overdosage: If you think you have taken too much of this medicine contact a poison control center or emergency room at once. NOTE: This medicine is only for you. Do not share this medicine with others. What if I miss a dose? Keep appointments for follow-up doses. It is important not to miss your dose. Call your care team if you are unable to keep an appointment. What may interact with this medication? Interactions have not been studied. This list may not describe all possible interactions. Give your health care provider a list of all the medicines, herbs, non-prescription drugs, or dietary supplements you use. Also tell them if you smoke, drink alcohol, or use illegal drugs. Some items may interact with your medicine. What should I watch for while using this medication? Your condition will be monitored carefully while you are receiving this medication. This medication can cause serious allergic reactions. To reduce your risk, your care team may give you other medication to take before  receiving this one. Be sure to follow the directions from your care team. This medication can affect the results of blood tests to match your blood type. These changes can last for up to 6 months after the final dose. Your care team will do blood tests to match your blood  type before you start treatment. Tell all of your care team that you are being treated with this medication before receiving a blood transfusion. This medication can affect the results of some tests used to determine treatment response; extra tests may be needed to evaluate response. Talk to your care team if you wish to become pregnant or think you are pregnant. This medication can cause serious birth defects if taken during pregnancy and for 3 months after the last dose. A reliable form of contraception is recommended while taking this medication and for 3 months after the last dose. Talk to your care team about effective forms of contraception. Do not breast-feed while taking this medication. What side effects may I notice from receiving this medication? Side effects that you should report to your care team as soon as possible: Allergic reactions--skin rash, itching, hives, swelling of the face, lips, tongue, or throat Heart rhythm changes--fast or irregular heartbeat, dizziness, feeling faint or lightheaded, chest pain, trouble breathing Infection--fever, chills, cough, sore throat, wounds that don't heal, pain or trouble when passing urine, general feeling of discomfort or being unwell Infusion reactions--chest pain, shortness of breath or trouble breathing, feeling faint or lightheaded Sudden eye pain or change in vision such as blurry vision, seeing halos around lights, vision loss Unusual bruising or bleeding Side effects that usually do not require medical attention (report to your care team if they continue or are bothersome): Constipation Diarrhea Fatigue Nausea Pain, tingling, or numbness in the hands or feet Swelling of the ankles, hands, or feet This list may not describe all possible side effects. Call your doctor for medical advice about side effects. You may report side effects to FDA at 1-800-FDA-1088. Where should I keep my medication? This medication is given in a hospital or  clinic. It will not be stored at home. NOTE: This sheet is a summary. It may not cover all possible information. If you have questions about this medicine, talk to your doctor, pharmacist, or health care provider.  2023 Elsevier/Gold Standard (2021-11-29 00:00:00)   BELOW ARE SYMPTOMS THAT SHOULD BE REPORTED IMMEDIATELY: *FEVER GREATER THAN 100.4 F (38 C) OR HIGHER *CHILLS OR SWEATING *NAUSEA AND VOMITING THAT IS NOT CONTROLLED WITH YOUR NAUSEA MEDICATION *UNUSUAL SHORTNESS OF BREATH *UNUSUAL BRUISING OR BLEEDING *URINARY PROBLEMS (pain or burning when urinating, or frequent urination) *BOWEL PROBLEMS (unusual diarrhea, constipation, pain near the anus) TENDERNESS IN MOUTH AND THROAT WITH OR WITHOUT PRESENCE OF ULCERS (sore throat, sores in mouth, or a toothache) UNUSUAL RASH, SWELLING OR PAIN  UNUSUAL VAGINAL DISCHARGE OR ITCHING   Items with * indicate a potential emergency and should be followed up as soon as possible or go to the Emergency Department if any problems should occur.  Please show the CHEMOTHERAPY ALERT CARD or IMMUNOTHERAPY ALERT CARD at check-in to the Emergency Department and triage nurse.  Should you have questions after your visit or need to cancel or reschedule your appointment, please contact [facility] [phone]  and follow the prompts.  Office hours are 8:00 a.m. to 4:30 p.m. Monday - Friday. Please note that voicemails left after 4:00 p.m. may not be returned until the following business day.  We are closed weekends and major holidays. You have access to a nurse at all times for urgent questions. Please call the main number to the clinic [phone] and follow the prompts.  For any non-urgent questions, you may also contact your provider using MyChart. We now offer e-Visits for anyone 61 and older to request care online for non-urgent symptoms. For details visit [email or web address].   Also download the MyChart app! Go to the app  store, search "MyChart", open the app, select [facility], and log in with your MyChart username and password.  Masks are optional in the cancer centers. If you would like for your care team to wear a mask while they are taking care of you, please let them know. You may have one support person who is at least 73 years old accompany you for your appointments.

## 2022-06-11 NOTE — Progress Notes (Signed)
Pt presents today for C1D1 Velcade and Daratumumab [place] per provider's order. Vital signs and other labs WNL for treatment today. Pt's ANC is 1.2 today, message to sent Alexandra Price and she ststed okay to proceed with treatment today.  Pt took her own pre-meds at 920 while in the clinic today.Consent obtained for treatment today. Pt voiced no new complaints after two hour wait time of receiving  Daratumumab [place]  Velcade and Daratumumab [place] given today per MD orders. Tolerated infusion without adverse affects. Vital signs stable. No complaints at this time. Discharged from clinic ambulatory in stable condition. Alert and oriented x 3. F/U with [facility] as scheduled.

## 2022-06-12 ENCOUNTER — Ambulatory Visit
Admission: RE | Admit: 2022-06-12 | Discharge: 2022-06-12 | Disposition: A | Discharge status: Home / Self Care | Payer: Medicare Other | Source: Ambulatory Visit | Attending: Radiation Oncology | Admitting: Radiation Oncology

## 2022-06-12 ENCOUNTER — Telehealth: Payer: Self-pay

## 2022-06-12 ENCOUNTER — Other Ambulatory Visit: Payer: Self-pay

## 2022-06-12 DIAGNOSIS — Z510 Encounter for antineoplastic radiation therapy: Secondary | ICD-10-CM | POA: Diagnosis not present

## 2022-06-12 LAB — RAD ONC ARIA SESSION SUMMARY
Course Elapsed Days: 5
Plan Fractions Treated to Date: 4
Plan Prescribed Dose Per Fraction: 2.5 Gy
Plan Total Fractions Prescribed: 10
Plan Total Prescribed Dose: 25 Gy
Reference Point Dosage Given to Date: 10 Gy
Reference Point Session Dosage Given: 2.5 Gy
Session Number: 4

## 2022-06-12 NOTE — Telephone Encounter (Signed)
Patient called for 24hr follow up, no response from patient. 

## 2022-06-13 ENCOUNTER — Ambulatory Visit
Admission: RE | Admit: 2022-06-13 | Discharge: 2022-06-13 | Disposition: A | Discharge status: Home / Self Care | Payer: Medicare Other | Source: Ambulatory Visit | Attending: Radiation Oncology | Admitting: Radiation Oncology

## 2022-06-13 ENCOUNTER — Other Ambulatory Visit: Payer: Self-pay

## 2022-06-13 DIAGNOSIS — Z510 Encounter for antineoplastic radiation therapy: Secondary | ICD-10-CM | POA: Diagnosis not present

## 2022-06-13 LAB — RAD ONC ARIA SESSION SUMMARY
Course Elapsed Days: 6
Plan Fractions Treated to Date: 5
Plan Prescribed Dose Per Fraction: 2.5 Gy
Plan Total Fractions Prescribed: 10
Plan Total Prescribed Dose: 25 Gy
Reference Point Dosage Given to Date: 12.5 Gy
Reference Point Session Dosage Given: 2.5 Gy
Session Number: 5

## 2022-06-13 LAB — PRETREATMENT RBC PHENOTYPE

## 2022-06-14 ENCOUNTER — Ambulatory Visit
Admission: RE | Admit: 2022-06-14 | Discharge: 2022-06-14 | Disposition: A | Discharge status: Home / Self Care | Payer: Medicare Other | Source: Ambulatory Visit | Attending: Radiation Oncology | Admitting: Radiation Oncology

## 2022-06-14 ENCOUNTER — Other Ambulatory Visit: Payer: Self-pay

## 2022-06-14 ENCOUNTER — Encounter: Payer: Self-pay | Admitting: Hematology

## 2022-06-14 ENCOUNTER — Inpatient Hospital Stay: Payer: Medicare Other

## 2022-06-14 ENCOUNTER — Encounter (HOSPITAL_COMMUNITY): Payer: Self-pay | Admitting: Hematology

## 2022-06-14 VITALS — BP 129/70 | HR 60 | Temp 96.9°F | Resp 16

## 2022-06-14 DIAGNOSIS — C9000 Multiple myeloma not having achieved remission: Secondary | ICD-10-CM

## 2022-06-14 DIAGNOSIS — Z510 Encounter for antineoplastic radiation therapy: Secondary | ICD-10-CM | POA: Diagnosis not present

## 2022-06-14 LAB — RAD ONC ARIA SESSION SUMMARY
Course Elapsed Days: 7
Plan Fractions Treated to Date: 6
Plan Prescribed Dose Per Fraction: 2.5 Gy
Plan Total Fractions Prescribed: 10
Plan Total Prescribed Dose: 25 Gy
Reference Point Dosage Given to Date: 15 Gy
Reference Point Session Dosage Given: 2.5 Gy
Session Number: 6

## 2022-06-14 MED ORDER — BORTEZOMIB CHEMO SQ INJECTION 3.5 MG (2.5MG/ML)
1.3000 mg/m2 | Freq: Once | INTRAMUSCULAR | Status: AC
  Administered 2022-06-14: 2.5 mg via SUBCUTANEOUS
  Filled 2022-06-14: qty 1

## 2022-06-14 NOTE — Progress Notes (Signed)
Patient presents today for D4C1 of Velcade, per treatment parameters, no labs needed for today's treatment. Dr. Delton Coombes made aware of Monday's labs.  Patient tolerated Velcade injection with no complaints voiced. Lab work reviewed. See MAR for details. Injection site clean and dry with no bruising or swelling noted. Patient stable during and after injection. Band aid applied. VSS. Patient left in satisfactory condition with no s/s of distress noted.

## 2022-06-14 NOTE — Patient Instructions (Signed)
[facility]  Discharge Instructions: Thank you for choosing [facility] to provide your oncology and hematology care.  If you have a lab appointment with the [facility], please come in thru the Main Entrance and check in at the main information desk.  Wear comfortable clothing and clothing appropriate for easy access to any Portacath or PICC line.   We strive to give you quality time with your provider. You may need to reschedule your appointment if you arrive late (15 or more minutes).  Arriving late affects you and other patients whose appointments are after yours.  Also, if you miss three or more appointments without notifying the office, you may be dismissed from the clinic at the provider's discretion.      For prescription refill requests, have your pharmacy contact our office and allow 72 hours for refills to be completed.    Today you received the following chemotherapy and/or immunotherapy agents Velcade, return as scheduled.   To help prevent nausea and vomiting after your treatment, we encourage you to take your nausea medication as directed.  BELOW ARE SYMPTOMS THAT SHOULD BE REPORTED IMMEDIATELY: *FEVER GREATER THAN 100.4 F (38 C) OR HIGHER *CHILLS OR SWEATING *NAUSEA AND VOMITING THAT IS NOT CONTROLLED WITH YOUR NAUSEA MEDICATION *UNUSUAL SHORTNESS OF BREATH *UNUSUAL BRUISING OR BLEEDING *URINARY PROBLEMS (pain or burning when urinating, or frequent urination) *BOWEL PROBLEMS (unusual diarrhea, constipation, pain near the anus) TENDERNESS IN MOUTH AND THROAT WITH OR WITHOUT PRESENCE OF ULCERS (sore throat, sores in mouth, or a toothache) UNUSUAL RASH, SWELLING OR PAIN  UNUSUAL VAGINAL DISCHARGE OR ITCHING   Items with * indicate a potential emergency and should be followed up as soon as possible or go to the Emergency Department if any problems should occur.  Please show the CHEMOTHERAPY ALERT CARD or IMMUNOTHERAPY ALERT CARD at  check-in to the Emergency Department and triage nurse.  Should you have questions after your visit or need to cancel or reschedule your appointment, please contact [facility] [phone]  and follow the prompts.  Office hours are 8:00 a.m. to 4:30 p.m. Monday - Friday. Please note that voicemails left after 4:00 p.m. may not be returned until the following business day.  We are closed weekends and major holidays. You have access to a nurse at all times for urgent questions. Please call the main number to the clinic [phone] and follow the prompts.  For any non-urgent questions, you may also contact your provider using MyChart. We now offer e-Visits for anyone 61 and older to request care online for non-urgent symptoms. For details visit [email or web address].   Also download the MyChart app! Go to the app store, search "MyChart", open the app, select [facility], and log in with your MyChart username and password.  Masks are optional in the cancer centers. If you would like for your care team to wear a mask while they are taking care of you, please let them know. You may have one support person who is at least 74 years old accompany you for your appointments.

## 2022-06-15 ENCOUNTER — Other Ambulatory Visit: Payer: Self-pay

## 2022-06-15 ENCOUNTER — Ambulatory Visit
Admission: RE | Admit: 2022-06-15 | Discharge: 2022-06-15 | Disposition: A | Discharge status: Home / Self Care | Payer: Medicare Other | Source: Ambulatory Visit | Attending: Radiation Oncology | Admitting: Radiation Oncology

## 2022-06-15 DIAGNOSIS — Z510 Encounter for antineoplastic radiation therapy: Secondary | ICD-10-CM | POA: Diagnosis not present

## 2022-06-15 LAB — RAD ONC ARIA SESSION SUMMARY
Course Elapsed Days: 8
Plan Fractions Treated to Date: 7
Plan Prescribed Dose Per Fraction: 2.5 Gy
Plan Total Fractions Prescribed: 10
Plan Total Prescribed Dose: 25 Gy
Reference Point Dosage Given to Date: 17.5 Gy
Reference Point Session Dosage Given: 2.5 Gy
Session Number: 7

## 2022-06-18 ENCOUNTER — Ambulatory Visit
Admission: RE | Admit: 2022-06-18 | Discharge: 2022-06-18 | Disposition: A | Discharge status: Home / Self Care | Payer: Medicare Other | Source: Ambulatory Visit | Attending: Radiation Oncology | Admitting: Radiation Oncology

## 2022-06-18 ENCOUNTER — Other Ambulatory Visit: Payer: Self-pay

## 2022-06-18 ENCOUNTER — Inpatient Hospital Stay: Payer: Medicare Other

## 2022-06-18 DIAGNOSIS — C9000 Multiple myeloma not having achieved remission: Secondary | ICD-10-CM

## 2022-06-18 DIAGNOSIS — Z510 Encounter for antineoplastic radiation therapy: Secondary | ICD-10-CM | POA: Diagnosis not present

## 2022-06-18 LAB — CBC WITH DIFFERENTIAL/PLATELET
Abs Immature Granulocytes: 0.01 10*3/uL (ref 0.00–0.07)
Basophils Absolute: 0 10*3/uL (ref 0.0–0.1)
Basophils Relative: 0 %
Eosinophils Absolute: 0.2 10*3/uL (ref 0.0–0.5)
Eosinophils Relative: 5 %
HCT: 30.2 % — ABNORMAL LOW (ref 36.0–46.0)
Hemoglobin: 9.9 g/dL — ABNORMAL LOW (ref 12.0–15.0)
Immature Granulocytes: 0 %
Lymphocytes Relative: 10 %
Lymphs Abs: 0.3 10*3/uL — ABNORMAL LOW (ref 0.7–4.0)
MCH: 33.1 pg (ref 26.0–34.0)
MCHC: 32.8 g/dL (ref 30.0–36.0)
MCV: 101 fL — ABNORMAL HIGH (ref 80.0–100.0)
Monocytes Absolute: 0.2 10*3/uL (ref 0.1–1.0)
Monocytes Relative: 6 %
Neutro Abs: 2.7 10*3/uL (ref 1.7–7.7)
Neutrophils Relative %: 79 %
Platelets: 137 10*3/uL — ABNORMAL LOW (ref 150–400)
RBC: 2.99 MIL/uL — ABNORMAL LOW (ref 3.87–5.11)
RDW: 13.5 % (ref 11.5–15.5)
WBC: 3.4 10*3/uL — ABNORMAL LOW (ref 4.0–10.5)
nRBC: 0 % (ref 0.0–0.2)

## 2022-06-18 LAB — RAD ONC ARIA SESSION SUMMARY
Course Elapsed Days: 11
Plan Fractions Treated to Date: 8
Plan Prescribed Dose Per Fraction: 2.5 Gy
Plan Total Fractions Prescribed: 10
Plan Total Prescribed Dose: 25 Gy
Reference Point Dosage Given to Date: 20 Gy
Reference Point Session Dosage Given: 2.5 Gy
Session Number: 8

## 2022-06-18 LAB — COMPREHENSIVE METABOLIC PANEL
ALT: 36 U/L (ref 0–44)
AST: 21 U/L (ref 15–41)
Albumin: 3.6 g/dL (ref 3.5–5.0)
Alkaline Phosphatase: 82 U/L (ref 38–126)
Anion gap: 6 (ref 5–15)
BUN: 31 mg/dL — ABNORMAL HIGH (ref 8–23)
CO2: 26 mmol/L (ref 22–32)
Calcium: 8.6 mg/dL — ABNORMAL LOW (ref 8.9–10.3)
Chloride: 105 mmol/L (ref 98–111)
Creatinine, Ser: 1.38 mg/dL — ABNORMAL HIGH (ref 0.44–1.00)
GFR, Estimated: 40 mL/min — ABNORMAL LOW (ref >60)
Glucose, Bld: 181 mg/dL — ABNORMAL HIGH (ref 70–99)
Potassium: 4 mmol/L (ref 3.5–5.1)
Sodium: 137 mmol/L (ref 135–145)
Total Bilirubin: 0.5 mg/dL (ref 0.3–1.2)
Total Protein: 6.6 g/dL (ref 6.5–8.1)

## 2022-06-18 MED ORDER — DARATUMUMAB-HYALURONIDASE-FIHJ 1800-30000 MG-UT/15ML ~~LOC~~ SOLN
1800.0000 mg | Freq: Once | SUBCUTANEOUS | Status: AC
  Administered 2022-06-18: 1800 mg via SUBCUTANEOUS
  Filled 2022-06-18: qty 15

## 2022-06-18 MED ORDER — BORTEZOMIB CHEMO SQ INJECTION 3.5 MG (2.5MG/ML)
1.3000 mg/m2 | Freq: Once | INTRAMUSCULAR | Status: AC
  Administered 2022-06-18: 2.5 mg via SUBCUTANEOUS
  Filled 2022-06-18: qty 1

## 2022-06-18 MED ORDER — SODIUM CHLORIDE 0.9% FLUSH
10.0000 mL | INTRAVENOUS | Status: DC | PRN
  Administered 2022-06-18: 10 mL via INTRAVENOUS

## 2022-06-18 MED ORDER — HEPARIN SOD (PORK) LOCK FLUSH 100 UNIT/ML IV SOLN
500.0000 [IU] | Freq: Once | INTRAVENOUS | Status: AC
  Administered 2022-06-18: 500 [IU] via INTRAVENOUS

## 2022-06-18 NOTE — Patient Instructions (Signed)
[facility]  Discharge Instructions: Thank you for choosing [facility] to provide your oncology and hematology care.  If you have a lab appointment with the [facility], please come in thru the Main Entrance and check in at the main information desk.  Wear comfortable clothing and clothing appropriate for easy access to any Portacath or PICC line.   We strive to give you quality time with your provider. You may need to reschedule your appointment if you arrive late (15 or more minutes).  Arriving late affects you and other patients whose appointments are after yours.  Also, if you miss three or more appointments without notifying the office, you may be dismissed from the clinic at the provider's discretion.      For prescription refill requests, have your pharmacy contact our office and allow 72 hours for refills to be completed.    Today you received the following chemotherapy and/or immunotherapy agents Daratumumab [place] and Velcade injection.   To help prevent nausea and vomiting after your treatment, we encourage you to take your nausea medication as directed.  BELOW ARE SYMPTOMS THAT SHOULD BE REPORTED IMMEDIATELY: *FEVER GREATER THAN 100.4 F (38 C) OR HIGHER *CHILLS OR SWEATING *NAUSEA AND VOMITING THAT IS NOT CONTROLLED WITH YOUR NAUSEA MEDICATION *UNUSUAL SHORTNESS OF BREATH *UNUSUAL BRUISING OR BLEEDING *URINARY PROBLEMS (pain or burning when urinating, or frequent urination) *BOWEL PROBLEMS (unusual diarrhea, constipation, pain near the anus) TENDERNESS IN MOUTH AND THROAT WITH OR WITHOUT PRESENCE OF ULCERS (sore throat, sores in mouth, or a toothache) UNUSUAL RASH, SWELLING OR PAIN  UNUSUAL VAGINAL DISCHARGE OR ITCHING   Items with * indicate a potential emergency and should be followed up as soon as possible or go to the Emergency Department if any problems should occur.  Please show the CHEMOTHERAPY ALERT CARD or IMMUNOTHERAPY ALERT CARD  at check-in to the Emergency Department and triage nurse.  Should you have questions after your visit or need to cancel or reschedule your appointment, please contact [facility] [phone]  and follow the prompts.  Office hours are 8:00 a.m. to 4:30 p.m. Monday - Friday. Please note that voicemails left after 4:00 p.m. may not be returned until the following business day.  We are closed weekends and major holidays. You have access to a nurse at all times for urgent questions. Please call the main number to the clinic [phone] and follow the prompts.  For any non-urgent questions, you may also contact your provider using MyChart. We now offer e-Visits for anyone 65 and older to request care online for non-urgent symptoms. For details visit [email or web address].   Also download the MyChart app! Go to the app store, search "MyChart", open the app, select [facility], and log in with your MyChart username and password.  Masks are optional in the cancer centers. If you would like for your care team to wear a mask while they are taking care of you, please let them know. You may have one support person who is at least 74 years old accompany you for your appointments.

## 2022-06-18 NOTE — Progress Notes (Signed)
Patient is here for treatment today per MD orders.  She is due for C1D8.  She reports being almost done with radiation.  This weekend was rough for her. She was fatigued and had some pain and heaviness in her arm.  She wasn't able to move it well and it caused pain in her back.  She has already reported this to Dr. Lisbeth Renshaw and he told patient it was from the positioning on the table for radiation.  She reports some nausea and is out of medication. I will send her a refill in today.  She did take her premeds at home around 1030. She also took her dexamethasone at home as well today.  Her labs are back and have been reviewed. She is okay to proceed with her treatment today.

## 2022-06-18 NOTE — Progress Notes (Signed)
Daratumumab [place] and Velcade given today per MD orders. Tolerated infusion without adverse affects. Vital signs stable. No complaints at this time. Discharged from clinic ambulatory in stable condition. Alert and oriented x 3. F/U with [facility] as scheduled.

## 2022-06-19 ENCOUNTER — Ambulatory Visit
Admission: RE | Admit: 2022-06-19 | Discharge: 2022-06-19 | Disposition: A | Discharge status: Home / Self Care | Payer: Medicare Other | Source: Ambulatory Visit | Attending: Radiation Oncology | Admitting: Radiation Oncology

## 2022-06-19 ENCOUNTER — Other Ambulatory Visit: Payer: Self-pay

## 2022-06-19 DIAGNOSIS — Z510 Encounter for antineoplastic radiation therapy: Secondary | ICD-10-CM | POA: Diagnosis not present

## 2022-06-19 LAB — RAD ONC ARIA SESSION SUMMARY
Course Elapsed Days: 12
Plan Fractions Treated to Date: 9
Plan Prescribed Dose Per Fraction: 2.5 Gy
Plan Total Fractions Prescribed: 10
Plan Total Prescribed Dose: 25 Gy
Reference Point Dosage Given to Date: 22.5 Gy
Reference Point Session Dosage Given: 2.5 Gy
Session Number: 9

## 2022-06-20 ENCOUNTER — Encounter: Payer: Self-pay | Admitting: Radiation Oncology

## 2022-06-20 ENCOUNTER — Other Ambulatory Visit: Payer: Self-pay

## 2022-06-20 ENCOUNTER — Ambulatory Visit
Admission: RE | Admit: 2022-06-20 | Discharge: 2022-06-20 | Disposition: A | Discharge status: Home / Self Care | Payer: Medicare Other | Source: Ambulatory Visit | Attending: Radiation Oncology | Admitting: Radiation Oncology

## 2022-06-20 DIAGNOSIS — F419 Anxiety disorder, unspecified: Secondary | ICD-10-CM | POA: Insufficient documentation

## 2022-06-20 DIAGNOSIS — Z510 Encounter for antineoplastic radiation therapy: Secondary | ICD-10-CM | POA: Insufficient documentation

## 2022-06-20 DIAGNOSIS — Z86711 Personal history of pulmonary embolism: Secondary | ICD-10-CM | POA: Diagnosis not present

## 2022-06-20 DIAGNOSIS — Z7961 Long term (current) use of immunomodulator: Secondary | ICD-10-CM | POA: Insufficient documentation

## 2022-06-20 DIAGNOSIS — C9000 Multiple myeloma not having achieved remission: Secondary | ICD-10-CM | POA: Insufficient documentation

## 2022-06-20 DIAGNOSIS — M545 Low back pain, unspecified: Secondary | ICD-10-CM | POA: Insufficient documentation

## 2022-06-20 DIAGNOSIS — M79629 Pain in unspecified upper arm: Secondary | ICD-10-CM | POA: Diagnosis not present

## 2022-06-20 DIAGNOSIS — N189 Chronic kidney disease, unspecified: Secondary | ICD-10-CM | POA: Diagnosis not present

## 2022-06-20 DIAGNOSIS — Z79899 Other long term (current) drug therapy: Secondary | ICD-10-CM | POA: Insufficient documentation

## 2022-06-20 DIAGNOSIS — Z9484 Stem cells transplant status: Secondary | ICD-10-CM | POA: Insufficient documentation

## 2022-06-20 DIAGNOSIS — D631 Anemia in chronic kidney disease: Secondary | ICD-10-CM | POA: Insufficient documentation

## 2022-06-20 DIAGNOSIS — Z23 Encounter for immunization: Secondary | ICD-10-CM | POA: Insufficient documentation

## 2022-06-20 DIAGNOSIS — Z79624 Long term (current) use of inhibitors of nucleotide synthesis: Secondary | ICD-10-CM | POA: Diagnosis not present

## 2022-06-20 DIAGNOSIS — Z7901 Long term (current) use of anticoagulants: Secondary | ICD-10-CM | POA: Insufficient documentation

## 2022-06-20 LAB — RAD ONC ARIA SESSION SUMMARY
Course Elapsed Days: 13
Plan Fractions Treated to Date: 10
Plan Prescribed Dose Per Fraction: 2.5 Gy
Plan Total Fractions Prescribed: 10
Plan Total Prescribed Dose: 25 Gy
Reference Point Dosage Given to Date: 25 Gy
Reference Point Session Dosage Given: 2.5 Gy
Session Number: 10

## 2022-06-21 ENCOUNTER — Inpatient Hospital Stay: Payer: Medicare Other | Attending: Hematology

## 2022-06-21 ENCOUNTER — Other Ambulatory Visit: Payer: Self-pay | Admitting: Hematology

## 2022-06-21 VITALS — BP 144/72 | HR 60 | Temp 96.2°F | Resp 18 | Wt 181.2 lb

## 2022-06-21 DIAGNOSIS — Z5112 Encounter for antineoplastic immunotherapy: Secondary | ICD-10-CM | POA: Insufficient documentation

## 2022-06-21 DIAGNOSIS — R110 Nausea: Secondary | ICD-10-CM

## 2022-06-21 DIAGNOSIS — F419 Anxiety disorder, unspecified: Secondary | ICD-10-CM | POA: Insufficient documentation

## 2022-06-21 DIAGNOSIS — Z86711 Personal history of pulmonary embolism: Secondary | ICD-10-CM | POA: Diagnosis not present

## 2022-06-21 DIAGNOSIS — Z7901 Long term (current) use of anticoagulants: Secondary | ICD-10-CM | POA: Insufficient documentation

## 2022-06-21 DIAGNOSIS — D631 Anemia in chronic kidney disease: Secondary | ICD-10-CM | POA: Diagnosis not present

## 2022-06-21 DIAGNOSIS — N189 Chronic kidney disease, unspecified: Secondary | ICD-10-CM | POA: Diagnosis not present

## 2022-06-21 DIAGNOSIS — C9000 Multiple myeloma not having achieved remission: Secondary | ICD-10-CM | POA: Diagnosis present

## 2022-06-21 MED ORDER — PROCHLORPERAZINE MALEATE 10 MG PO TABS
10.0000 mg | ORAL_TABLET | Freq: Once | ORAL | Status: AC
  Administered 2022-06-21: 10 mg via ORAL
  Filled 2022-06-21: qty 1

## 2022-06-21 MED ORDER — BORTEZOMIB CHEMO SQ INJECTION 3.5 MG (2.5MG/ML)
1.3000 mg/m2 | Freq: Once | INTRAMUSCULAR | Status: AC
  Administered 2022-06-21: 2.5 mg via SUBCUTANEOUS
  Filled 2022-06-21: qty 1

## 2022-06-21 MED ORDER — PROCHLORPERAZINE MALEATE 10 MG PO TABS: 10.0000 mg | ORAL_TABLET | Freq: Four times a day (QID) | ORAL | 0 refills | Status: AC | PRN

## 2022-06-21 NOTE — Patient Instructions (Signed)
[facility]  Discharge Instructions: Thank you for choosing [facility] to provide your oncology and hematology care.  If you have a lab appointment with the [facility], please come in thru the Main Entrance and check in at the main information desk.  Wear comfortable clothing and clothing appropriate for easy access to any Portacath or PICC line.   We strive to give you quality time with your provider. You may need to reschedule your appointment if you arrive late (15 or more minutes).  Arriving late affects you and other patients whose appointments are after yours.  Also, if you miss three or more appointments without notifying the office, you may be dismissed from the clinic at the provider's discretion.      For prescription refill requests, have your pharmacy contact our office and allow 72 hours for refills to be completed.    Today you received the following chemotherapy and/or immunotherapy agents Velcade, return as scheduled.   To help prevent nausea and vomiting after your treatment, we encourage you to take your nausea medication as directed.  BELOW ARE SYMPTOMS THAT SHOULD BE REPORTED IMMEDIATELY: *FEVER GREATER THAN 100.4 F (38 C) OR HIGHER *CHILLS OR SWEATING *NAUSEA AND VOMITING THAT IS NOT CONTROLLED WITH YOUR NAUSEA MEDICATION *UNUSUAL SHORTNESS OF BREATH *UNUSUAL BRUISING OR BLEEDING *URINARY PROBLEMS (pain or burning when urinating, or frequent urination) *BOWEL PROBLEMS (unusual diarrhea, constipation, pain near the anus) TENDERNESS IN MOUTH AND THROAT WITH OR WITHOUT PRESENCE OF ULCERS (sore throat, sores in mouth, or a toothache) UNUSUAL RASH, SWELLING OR PAIN  UNUSUAL VAGINAL DISCHARGE OR ITCHING   Items with * indicate a potential emergency and should be followed up as soon as possible or go to the Emergency Department if any problems should occur.  Please show the CHEMOTHERAPY ALERT CARD or IMMUNOTHERAPY ALERT CARD at  check-in to the Emergency Department and triage nurse.  Should you have questions after your visit or need to cancel or reschedule your appointment, please contact [facility] [phone]  and follow the prompts.  Office hours are 8:00 a.m. to 4:30 p.m. Monday - Friday. Please note that voicemails left after 4:00 p.m. may not be returned until the following business day.  We are closed weekends and major holidays. You have access to a nurse at all times for urgent questions. Please call the main number to the clinic [phone] and follow the prompts.  For any non-urgent questions, you may also contact your provider using MyChart. We now offer e-Visits for anyone 40 and older to request care online for non-urgent symptoms. For details visit [email or web address].   Also download the MyChart app! Go to the app store, search "MyChart", open the app, select [facility], and log in with your MyChart username and password.  Masks are optional in the cancer centers. If you would like for your care team to wear a mask while they are taking care of you, please let them know. You may have one support person who is at least 74 years old accompany you for your appointments.

## 2022-06-21 NOTE — Progress Notes (Signed)
Patient presents today for D11 Velcade, patient reports nausea after treatment on Thursday's. Patient request compazine today and at home, Dr. Delton Coombes made aware and RN received orders to give compazine in clinic and prescription sent into patient's pharmacy.  Patient tolerated Velcade injection with no complaints voiced. Lab work reviewed. See MAR for details. Injection site clean and dry with no bruising or swelling noted. Patient stable during and after injection. Band aid applied. VSS. Patient left in satisfactory condition with no s/s of distress noted.

## 2022-06-22 ENCOUNTER — Other Ambulatory Visit: Payer: Self-pay

## 2022-06-22 DIAGNOSIS — C9000 Multiple myeloma not having achieved remission: Secondary | ICD-10-CM

## 2022-06-22 MED ORDER — ACYCLOVIR 400 MG PO TABS: 400.0000 mg | ORAL_TABLET | Freq: Two times a day (BID) | ORAL | 6 refills | Status: DC

## 2022-06-25 ENCOUNTER — Inpatient Hospital Stay: Payer: Medicare Other

## 2022-06-25 ENCOUNTER — Inpatient Hospital Stay (HOSPITAL_BASED_OUTPATIENT_CLINIC_OR_DEPARTMENT_OTHER): Payer: Medicare Other | Admitting: Hematology

## 2022-06-25 VITALS — BP 149/67 | HR 78 | Temp 97.6°F | Resp 20 | Wt 180.6 lb

## 2022-06-25 DIAGNOSIS — C9000 Multiple myeloma not having achieved remission: Secondary | ICD-10-CM

## 2022-06-25 DIAGNOSIS — Z95828 Presence of other vascular implants and grafts: Secondary | ICD-10-CM

## 2022-06-25 DIAGNOSIS — C9001 Multiple myeloma in remission: Secondary | ICD-10-CM

## 2022-06-25 DIAGNOSIS — Z5112 Encounter for antineoplastic immunotherapy: Secondary | ICD-10-CM | POA: Diagnosis not present

## 2022-06-25 LAB — CBC WITH DIFFERENTIAL/PLATELET
Abs Immature Granulocytes: 0.01 10*3/uL (ref 0.00–0.07)
Basophils Absolute: 0 10*3/uL (ref 0.0–0.1)
Basophils Relative: 0 %
Eosinophils Absolute: 0.1 10*3/uL (ref 0.0–0.5)
Eosinophils Relative: 2 %
HCT: 31.2 % — ABNORMAL LOW (ref 36.0–46.0)
Hemoglobin: 10.1 g/dL — ABNORMAL LOW (ref 12.0–15.0)
Immature Granulocytes: 0 %
Lymphocytes Relative: 9 %
Lymphs Abs: 0.4 10*3/uL — ABNORMAL LOW (ref 0.7–4.0)
MCH: 33 pg (ref 26.0–34.0)
MCHC: 32.4 g/dL (ref 30.0–36.0)
MCV: 102 fL — ABNORMAL HIGH (ref 80.0–100.0)
Monocytes Absolute: 0.3 10*3/uL (ref 0.1–1.0)
Monocytes Relative: 8 %
Neutro Abs: 3.3 10*3/uL (ref 1.7–7.7)
Neutrophils Relative %: 81 %
Platelets: 98 10*3/uL — ABNORMAL LOW (ref 150–400)
RBC: 3.06 MIL/uL — ABNORMAL LOW (ref 3.87–5.11)
RDW: 13.9 % (ref 11.5–15.5)
WBC: 4 10*3/uL (ref 4.0–10.5)
nRBC: 0 % (ref 0.0–0.2)

## 2022-06-25 LAB — COMPREHENSIVE METABOLIC PANEL
ALT: 31 U/L (ref 0–44)
AST: 20 U/L (ref 15–41)
Albumin: 3.6 g/dL (ref 3.5–5.0)
Alkaline Phosphatase: 94 U/L (ref 38–126)
Anion gap: 5 (ref 5–15)
BUN: 29 mg/dL — ABNORMAL HIGH (ref 8–23)
CO2: 28 mmol/L (ref 22–32)
Calcium: 8.6 mg/dL — ABNORMAL LOW (ref 8.9–10.3)
Chloride: 103 mmol/L (ref 98–111)
Creatinine, Ser: 1.54 mg/dL — ABNORMAL HIGH (ref 0.44–1.00)
GFR, Estimated: 35 mL/min — ABNORMAL LOW (ref >60)
Glucose, Bld: 213 mg/dL — ABNORMAL HIGH (ref 70–99)
Potassium: 3.7 mmol/L (ref 3.5–5.1)
Sodium: 136 mmol/L (ref 135–145)
Total Bilirubin: 0.8 mg/dL (ref 0.3–1.2)
Total Protein: 6.7 g/dL (ref 6.5–8.1)

## 2022-06-25 MED ORDER — HEPARIN SOD (PORK) LOCK FLUSH 100 UNIT/ML IV SOLN
500.0000 [IU] | Freq: Once | INTRAVENOUS | Status: AC
  Administered 2022-06-25: 500 [IU] via INTRAVENOUS

## 2022-06-25 MED ORDER — HYDROCODONE-ACETAMINOPHEN 10-325 MG PO TABS: 1.0000 | ORAL_TABLET | Freq: Four times a day (QID) | ORAL | 0 refills | Status: DC | PRN

## 2022-06-25 MED ORDER — SODIUM CHLORIDE 0.9% FLUSH
10.0000 mL | INTRAVENOUS | Status: DC | PRN
  Administered 2022-06-25: 10 mL

## 2022-06-25 MED ORDER — DARATUMUMAB-HYALURONIDASE-FIHJ 1800-30000 MG-UT/15ML ~~LOC~~ SOLN
1800.0000 mg | Freq: Once | SUBCUTANEOUS | Status: AC
  Administered 2022-06-25: 1800 mg via SUBCUTANEOUS
  Filled 2022-06-25: qty 15

## 2022-06-25 MED ORDER — SODIUM CHLORIDE 0.9% FLUSH
10.0000 mL | Freq: Once | INTRAVENOUS | Status: AC
  Administered 2022-06-25: 10 mL via INTRAVENOUS

## 2022-06-25 NOTE — Progress Notes (Signed)
[facility] [place], [place] [address]   CLINIC:  Medical Oncology/Hematology  PCP:  [name], MD [address] [place] / [place] [address] [phone]   REASON FOR VISIT:  Follow-up for multiple myeloma  PRIOR THERAPY:  1. KPD x 4 cycles from 04/17/2018 to 08/21/2018. 2. Stem cell transplant on 10/23/2018. 3. Radiation to left and right humerus 25 Gy in 10 fractions from 08/23/2020 to 08/25/2020. 4.  Maintenance Pomalyst until 06/08/2022  CURRENT THERAPY: Darzalex, Velcade and dexamethasone started on 06/11/2022  BRIEF ONCOLOGIC HISTORY:  Oncology History  Multiple myeloma not having achieved remission ([facility])  04/07/2018 Initial Diagnosis   Multiple myeloma not having achieved remission ([facility])   06/11/2022 -  Chemotherapy   Patient is on Treatment Plan : MYELOMA RELAPSED / REFRACTORY Daratumumab SQ + Bortezomib + Dexamethasone (DaraVd) q21d / Daratumumab SQ q28d      Multiple myeloma without remission ([facility])  04/15/2018 Initial Diagnosis   Multiple myeloma without remission ([facility])   04/17/2018 - 09/05/2018 Chemotherapy   The patient had dexamethasone (DECADRON) 4 MG tablet, 1 of 1 cycle, Start date: 04/15/2018, End date: 05/26/2018 palonosetron (ALOXI) injection 0.25 mg, 0.25 mg, Intravenous,  Once, 1 of 1 cycle Administration: 0.25 mg (04/17/2018), 0.25 mg (04/24/2018), 0.25 mg (05/01/2018) cyclophosphamide (CYTOXAN) 540 mg in sodium chloride 0.9 % 250 mL chemo infusion, 300 mg/m2 = 540 mg, Intravenous,  Once, 1 of 1 cycle Administration: 540 mg (04/17/2018), 540 mg (04/24/2018), 540 mg (05/01/2018) carfilzomib (KYPROLIS) 36 mg in dextrose 5 % 50 mL chemo infusion, 20 mg/m2 = 36 mg, Intravenous, Once, 5 of 5 cycles Administration: 36 mg (04/17/2018), 36 mg (04/18/2018), 60 mg (04/24/2018), 60 mg (04/25/2018), 60 mg (05/01/2018), 60 mg (05/02/2018), 60 mg (05/15/2018), 60 mg (05/16/2018), 60 mg (06/12/2018), 60 mg (06/13/2018), 60 mg (06/26/2018), 60 mg (06/27/2018), 60  mg (07/03/2018), 60 mg (07/04/2018), 60 mg (07/24/2018), 60 mg (07/25/2018), 60 mg (07/31/2018), 60 mg (08/01/2018), 60 mg (08/08/2018), 60 mg (08/07/2018), 60 mg (08/21/2018), 60 mg (08/22/2018), 60 mg (08/28/2018), 60 mg (08/29/2018), 60 mg (09/04/2018), 60 mg (09/05/2018)  for chemotherapy treatment.    06/11/2022 -  Chemotherapy   Patient is on Treatment Plan : MYELOMA RELAPSED / REFRACTORY Daratumumab SQ + Bortezomib + Dexamethasone (DaraVd) q21d / Daratumumab SQ q28d        CANCER STAGING:  Cancer Staging  No matching staging information was found for the patient.  INTERVAL HISTORY:  Ms. LUWANA BUTRICK, a 76 y.o. female, seen for follow-up of multiple myeloma.  She finished radiation therapy on 06/20/2022.  She was started on daratumumab and bortezomib on 06/11/2022.  She denies any tingling or numbness in the extremities.  REVIEW OF SYSTEMS:  Review of Systems  Musculoskeletal:  Positive for back pain (4/10 L side - stable).  Neurological:  Positive for dizziness and numbness (Right shin).  Psychiatric/Behavioral:  Positive for sleep disturbance.   All other systems reviewed and are negative.   PAST MEDICAL/SURGICAL HISTORY:  Past Medical History:  Diagnosis Date   Anxiety    Chronic kidney disease    Depression    Hypertension    Multiple myeloma ([facility])    multiple myeloma   Pre-diabetes    Past Surgical History:  Procedure Laterality Date   ABDOMINAL HYSTERECTOMY     total   APPENDECTOMY     BALLOON DILATION N/A 05/28/2022   Procedure: BALLOON DILATION;  Surgeon: [name], DO;  [facility];  Service: Endoscopy;  Laterality: N/A;   BIOPSY  05/28/2022   Procedure: BIOPSY;  Surgeon: [name], DO;  [facility];  Service: Endoscopy;;   COLONOSCOPY WITH PROPOFOL N/A 12/25/2021   Procedure: COLONOSCOPY WITH PROPOFOL;  Surgeon: [name], DO;  [facility];  Service: Endoscopy;  Laterality: N/A;  2:30pm    ESOPHAGOGASTRODUODENOSCOPY (EGD) WITH PROPOFOL N/A 05/28/2022   Procedure: ESOPHAGOGASTRODUODENOSCOPY (EGD) WITH PROPOFOL;  Surgeon: [name], DO;  [facility];  Service: Endoscopy;  Laterality: N/A;  10:00am, asa 3   LAPAROSCOPIC APPENDECTOMY N/A 05/20/2018   Procedure: APPENDECTOMY LAPAROSCOPIC;  Surgeon: [name], MD;  Location: [facility];  Service: General;  Laterality: N/A;   POLYPECTOMY  12/25/2021   Procedure: POLYPECTOMY;  Surgeon: [name], DO;  [facility];  Service: Endoscopy;;   PORTACATH PLACEMENT Right 04/14/2018   Procedure: INSERTION PORT-A-CATH;  Surgeon: [name], MD;  Location: [facility];  Service: General;  Laterality: Right;    SOCIAL HISTORY:  Social History   Socioeconomic History   Marital status: Legally Separated    Spouse name: Not on file   Number of children: 6   Years of education: Not on file   Highest education level: Not on file  Occupational History    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  Tobacco Use   Smoking status: Never   Smokeless tobacco: Never  Vaping Use   Vaping Use: Never used  Substance and Sexual Activity   Alcohol use: Never   Drug use: Not Currently   Sexual activity: Not Currently  Other Topics Concern   Not on file  Social History Narrative   Not on file   Social Determinants of Health   Financial Resource Strain: Low Risk  (07/06/2020)   Overall Financial Resource Strain (CARDIA)    Difficulty of Paying Living Expenses: Not hard at all  Food Insecurity: No Food Insecurity (07/06/2020)   Hunger Vital Sign    Worried About Running Out of Food in the Last Year: Never true    Ran Out of Food in the Last Year: Never true  Transportation Needs: No Transportation Needs (07/06/2020)   PRAPARE - Hydrologist (Medical): No    Lack of Transportation (Non-Medical): No  Physical Activity: Inactive (07/06/2020)   Exercise Vital Sign    Days of Exercise per Week: 0  days    Minutes of Exercise per Session: 0 min  Stress: No Stress Concern Present (07/06/2020)   [place]    Feeling of Stress : Not at all  Social Connections: Moderately Isolated (07/06/2020)   Social Connection and Isolation Panel [NHANES]    Frequency of Communication with Friends and Family: More than three times a week    Frequency of Social Gatherings with Friends and Family: Twice a week    Attends Religious Services: 1 to 4 times per year    Active Member of [employer] or Organizations: No    Attends Archivist Meetings: Never    Marital Status: Separated  Intimate Partner Violence: Not At Risk (08/02/2020)   Humiliation, Afraid, Rape, and Kick questionnaire    Fear of Current or Ex-Partner: No    Emotionally Abused: No    Physically Abused: No    Sexually Abused: No    FAMILY HISTORY:  Family History  Problem Relation Age of Onset   Heart disease Mother    Emphysema Father  Diabetes Sister    Depression Sister    Cancer Brother        liver, lung, and colon. colon cancer at age 30.   Diabetes Brother     CURRENT MEDICATIONS:  Current Outpatient Medications  Medication Sig Dispense Refill   acyclovir (ZOVIRAX) 400 MG tablet Take 1 tablet (400 mg total) by mouth 2 (two) times daily. 60 tablet 6   ALPRAZolam (XANAX) 0.5 MG tablet TAKE 1 TABLET BY MOUTH DURING THE DAY AS NEEDED FOR ANXIETY AND 2 TABLETS AT BEDTIME AS NEEDED FOR ANXIETY/INSOMNIA 90 tablet 0   Calcium Carb-Cholecalciferol (CALCIUM 1000 + D PO) Take 1,000 mg by mouth daily.     clotrimazole-betamethasone (LOTRISONE) cream Apply 1 Application topically 2 (two) times daily as needed (irritation).     dexAMETHasone 20 MG TABS Take 1 tablet by mouth as directed. Take 1 tablet 30 minutes prior to infusions 30 tablet 2   ELIQUIS 2.5 MG TABS tablet Take 2.5 mg by mouth 2 (two) times daily.     escitalopram (LEXAPRO) 20 MG tablet Take 20  mg by mouth daily.     HYDROcodone-acetaminophen (NORCO) 10-325 MG tablet Take 1 tablet by mouth every 6 (six) hours as needed. 120 tablet 0   ibuprofen (ADVIL) 200 MG tablet Take 200 mg by mouth every 6 (six) hours as needed for moderate pain. 30 tablet 0   montelukast (SINGULAIR) 10 MG tablet TAKE 1 TABLET BY MOUTH AS DIRECTED 30 MINUTES PRIOR TO TREATMENT 90 tablet 0   Multiple Vitamin (MULTI VITAMIN) TABS Take 1 tablet by mouth daily.     pantoprazole (PROTONIX) 40 MG tablet Take 1 tablet (40 mg total) by mouth daily. 90 tablet 6   polyethylene glycol powder (GLYCOLAX/MIRALAX) 17 GM/SCOOP powder Take one capful once to twice daily as needed (Patient taking differently: Take 17 g by mouth daily as needed for moderate constipation.) 255 g 3   prochlorperazine (COMPAZINE) 10 MG tablet Take 1 tablet (10 mg total) by mouth every 6 (six) hours as needed for nausea or vomiting. 30 tablet 0   rosuvastatin (CRESTOR) 10 MG tablet Take 10 mg by mouth daily.     zolpidem (AMBIEN) 10 MG tablet Take 1 tablet (10 mg total) by mouth at bedtime as needed for sleep. 30 tablet 2   No current facility-administered medications for this visit.   Facility-Administered Medications Ordered in Other Visits  Medication Dose Route Frequency Provider Last Rate Last Admin   sodium chloride flush (NS) 0.9 % injection 10 mL  10 mL Intracatheter PRN [name], MD   10 mL at 06/25/22 1310    ALLERGIES:  Allergies  Allergen Reactions   Ciprofloxacin Anaphylaxis   Amoxicillin Other (See Comments)    unknown   Morphine And Related Nausea And Vomiting   Augmentin [Amoxicillin-Pot Clavulanate] Other (See Comments)    Headache, insomnia    PHYSICAL EXAM:  Performance status (ECOG): 1 - Symptomatic but completely ambulatory  There were no vitals filed for this visit.  Wt Readings from Last 3 Encounters:  06/25/22 180 lb 9.6 oz (81.9 kg)  06/21/22 181 lb 3.2 oz (82.2 kg)  06/11/22 181 lb 6.4 oz (82.3 kg)    Physical Exam Vitals reviewed.  Constitutional:      Appearance: Normal appearance. She is obese.  Cardiovascular:     Rate and Rhythm: Normal rate and regular rhythm.     Pulses: Normal pulses.     Heart sounds: Normal heart sounds.  Pulmonary:  Effort: Pulmonary effort is normal.     Breath sounds: Normal breath sounds.  Neurological:     General: No focal deficit present.     Mental Status: She is alert and oriented to person, place, and time.  Psychiatric:        Mood and Affect: Mood normal.        Behavior: Behavior normal.      LABORATORY DATA:  I have reviewed the labs as listed.     Latest Ref Rng & Units 06/25/2022   11:14 AM 06/18/2022   11:49 AM 06/11/2022    9:10 AM  CBC  WBC 4.0 - 10.5 K/uL 4.0  3.4  2.6   Hemoglobin 12.0 - 15.0 g/dL 10.1  9.9  10.1   Hematocrit 36.0 - 46.0 % 31.2  30.2  31.5   Platelets 150 - 400 K/uL 98  137  137       Latest Ref Rng & Units 06/25/2022   11:14 AM 06/18/2022   11:49 AM 06/11/2022    9:10 AM  CMP  Glucose 70 - 99 mg/dL 213  181  176   BUN 8 - 23 mg/dL _0 Creatinine 0.44 - 1.00 mg/dL 1.54  1.38  1.42   Sodium 135 - 145 mmol/L 136  137  139   Potassium 3.5 - 5.1 mmol/L 3.7  4.0  4.0   Chloride 98 - 111 mmol/L 103  105  103   CO2 22 - 32 mmol/L _1 Calcium 8.9 - 10.3 mg/dL 8.6  8.6  9.3   Total Protein 6.5 - 8.1 g/dL 6.7  6.6  6.9   Total Bilirubin 0.3 - 1.2 mg/dL 0.8  0.5  0.6   Alkaline Phos 38 - 126 U/L 94  82  72   AST 15 - 41 U/L _2 ALT 0 - 44 U/L 31  36  30     DIAGNOSTIC IMAGING:  I have independently reviewed the scans and discussed with the patient. No results found.   ASSESSMENT:  1.  IgG lambda plasma cell myeloma, stage II, standard risk: -4 cycles of KPD from 04/17/2018 through 08/21/2018, stem cell transplant on 10/23/2018. -PET scan on 01/21/2019 showed multiple bone lesions but without any hypermetabolic activity. -BMBX on 01/21/2019 with normocellular marrow with no  increase in plasma cells.  Normal FISH.  MRD results negative. -Maintenance pomalidomide 2 mg 3 weeks on/1 week off started on 03/10/2019. -Bone marrow biopsy on 10/29/2019 shows trilineage hematopoiesis with no evidence of plasma cells.  Chromosome analysis and FISH are normal. -BM BX on 10/27/2020 at [facility] with 30 to 40%, TLH.  2% of the total cells are CD 138+ plasma cells. - Pomalyst was held due to dizziness from 03/29/2021 through 04/26/2021. - MRI of the brain on 03/31/2021 did not show any evidence of intracranial lesions.  Multiple bone lesions in the skull compatible with sequela of myeloma.  - Labs at [facility] on 11/16/2021: M spike 0.24 g.  Immunofixation positive for IgG lambda. - PET scan on 12/14/2021: No evidence of FDG avid osseous or soft tissue myeloma. - She has developed left shoulder blade and right posterior rib pain for the last 3 to 4 months. - 24-hour urine on 12/14/2021: Total protein 136 mg.  Immune immunofixation positive for lambda type Bence-Jones protein. -- PET scan (05/24/2022): Single new hypermetabolic lytic lesion involving left lamina  of T11.  No canal encroachment.  No other hypermetabolic bone lesions.  Stable diffuse lytic lesions throughout the axial and appendicular skeleton.  No soft tissue lesions. - Maintenance Pomalyst discontinued on 05/29/2022 - XRT to the T11 completed on 06/20/2022 - Daratumumab, Velcade and dexamethasone started on 06/11/2022  2.  Pulmonary embolism: -CT angiogram on 04/21/2020 showed filling defect at [facility]. -VQ scan confirmed pulmonary embolism. -She is on Eliquis.   3.  Right shoulder and upper arm pain: - She received XRT to the chest from 12/26/2020 through 01/06/2021.   PLAN:  1.  IgG lambda plasma cell myeloma: - She has completed XRT to the T11 lamina on 06/20/2022.  She reports improvement in back pain. - She has tolerated Darzalex and Velcade for 2 weeks very well.  She is taking  dexamethasone 20 mg weekly. - Reviewed labs today which showed creatinine 1.54 stable.  CBC shows platelet count 98, normal white count and hemoglobin 10.1.  Proceed with cycle 1 day 15 today. - She will come back next week for cycle 2-day 1.  Will check myeloma labs next week.  RTC 4 weeks prior to start of cycle 3.    2.  Back pain/left shoulder blade pain/right lateral and posterior rib pains: - Continue hydrocodone 10 mg every 6 hours which is helping.   3.  Myeloma bone disease: - Denosumab held due to exposure of the bone in the right lower jaw.  Continue calcium and vitamin D supplements.   4.  Anxiety: - Continue Xanax twice daily as needed.   5.  Sleeping difficulty: - Continue Ambien at bedtime as needed.   6.  Macrocytic anemia: -Combination anemia from CKD and myelosuppression.  Hemoglobin today is 10.1.   7.  Pulmonary embolism: - Continue Eliquis.  No bleeding issues.   Orders placed this encounter:  Orders Placed This Encounter  Procedures   Magnesium   Kappa/lambda light chains   Protein electrophoresis, serum       [name], MD [place] [phone]

## 2022-06-25 NOTE — Patient Instructions (Signed)
[facility]  Discharge Instructions: Thank you for choosing [facility] to provide your oncology and hematology care.  If you have a lab appointment with the [facility], please come in thru the Main Entrance and check in at the main information desk.  Wear comfortable clothing and clothing appropriate for easy access to any Portacath or PICC line.   We strive to give you quality time with your provider. You may need to reschedule your appointment if you arrive late (15 or more minutes).  Arriving late affects you and other patients whose appointments are after yours.  Also, if you miss three or more appointments without notifying the office, you may be dismissed from the clinic at the provider's discretion.      For prescription refill requests, have your pharmacy contact our office and allow 72 hours for refills to be completed.    Today you received the following chemotherapy and/or immunotherapy agents Dara Barron   To help prevent nausea and vomiting after your treatment, we encourage you to take your nausea medication as directed.  Daratumumab; Hyaluronidase Injection What is this medication? DARATUMUMAB; HYALURONIDASE (dar a toom ue mab; hye al ur ON i dase) treats multiple myeloma, a type of bone marrow cancer. Daratumumab works by blocking a protein that causes cancer cells to grow and multiply. This helps to slow or stop the spread of cancer cells. Hyaluronidase works by increasing the absorption of other medications in the body to help them work better. This medication may also be used treat amyloidosis, a condition that causes the buildup of a protein (amyloid) in your body. It works by reducing the buildup of this protein, which decreases symptoms. It is a combination medication that contains a monoclonal antibody. This medicine may be used for other purposes; ask your health care provider or pharmacist if you have questions. COMMON BRAND NAME(S):  DARZALEX FASPRO What should I tell my care team before I take this medication? They need to know if you have any of these conditions: Heart disease Infection, such as chickenpox, cold sores, herpes, hepatitis B Lung or breathing disease An unusual or allergic reaction to daratumumab, hyaluronidase, other medications, foods, dyes, or preservatives Pregnant or trying to get pregnant Breast-feeding How should I use this medication? This medication is injected under the skin. It is given by your care team in a hospital or clinic setting. Talk to your care team about the use of this medication in children. Special care may be needed. Overdosage: If you think you have taken too much of this medicine contact a poison control center or emergency room at once. NOTE: This medicine is only for you. Do not share this medicine with others. What if I miss a dose? Keep appointments for follow-up doses. It is important not to miss your dose. Call your care team if you are unable to keep an appointment. What may interact with this medication? Interactions have not been studied. This list may not describe all possible interactions. Give your health care provider a list of all the medicines, herbs, non-prescription drugs, or dietary supplements you use. Also tell them if you smoke, drink alcohol, or use illegal drugs. Some items may interact with your medicine. What should I watch for while using this medication? Your condition will be monitored carefully while you are receiving this medication. This medication can cause serious allergic reactions. To reduce your risk, your care team may give you other medication to take before receiving this  one. Be sure to follow the directions from your care team. This medication can affect the results of blood tests to match your blood type. These changes can last for up to 6 months after the final dose. Your care team will do blood tests to match your blood type before you  start treatment. Tell all of your care team that you are being treated with this medication before receiving a blood transfusion. This medication can affect the results of some tests used to determine treatment response; extra tests may be needed to evaluate response. Talk to your care team if you wish to become pregnant or think you are pregnant. This medication can cause serious birth defects if taken during pregnancy and for 3 months after the last dose. A reliable form of contraception is recommended while taking this medication and for 3 months after the last dose. Talk to your care team about effective forms of contraception. Do not breast-feed while taking this medication. What side effects may I notice from receiving this medication? Side effects that you should report to your care team as soon as possible: Allergic reactions--skin rash, itching, hives, swelling of the face, lips, tongue, or throat Heart rhythm changes--fast or irregular heartbeat, dizziness, feeling faint or lightheaded, chest pain, trouble breathing Infection--fever, chills, cough, sore throat, wounds that don't heal, pain or trouble when passing urine, general feeling of discomfort or being unwell Infusion reactions--chest pain, shortness of breath or trouble breathing, feeling faint or lightheaded Sudden eye pain or change in vision such as blurry vision, seeing halos around lights, vision loss Unusual bruising or bleeding Side effects that usually do not require medical attention (report to your care team if they continue or are bothersome): Constipation Diarrhea Fatigue Nausea Pain, tingling, or numbness in the hands or feet Swelling of the ankles, hands, or feet This list may not describe all possible side effects. Call your doctor for medical advice about side effects. You may report side effects to FDA at 1-800-FDA-1088. Where should I keep my medication? This medication is given in a hospital or clinic. It will  not be stored at home. NOTE: This sheet is a summary. It may not cover all possible information. If you have questions about this medicine, talk to your doctor, pharmacist, or health care provider.  2023 Elsevier/Gold Standard (2021-11-29 00:00:00)   BELOW ARE SYMPTOMS THAT SHOULD BE REPORTED IMMEDIATELY: *FEVER GREATER THAN 100.4 F (38 C) OR HIGHER *CHILLS OR SWEATING *NAUSEA AND VOMITING THAT IS NOT CONTROLLED WITH YOUR NAUSEA MEDICATION *UNUSUAL SHORTNESS OF BREATH *UNUSUAL BRUISING OR BLEEDING *URINARY PROBLEMS (pain or burning when urinating, or frequent urination) *BOWEL PROBLEMS (unusual diarrhea, constipation, pain near the anus) TENDERNESS IN MOUTH AND THROAT WITH OR WITHOUT PRESENCE OF ULCERS (sore throat, sores in mouth, or a toothache) UNUSUAL RASH, SWELLING OR PAIN  UNUSUAL VAGINAL DISCHARGE OR ITCHING   Items with * indicate a potential emergency and should be followed up as soon as possible or go to the Emergency Department if any problems should occur.  Please show the CHEMOTHERAPY ALERT CARD or IMMUNOTHERAPY ALERT CARD at check-in to the Emergency Department and triage nurse.  Should you have questions after your visit or need to cancel or reschedule your appointment, please contact [facility] [phone]  and follow the prompts.  Office hours are 8:00 a.m. to 4:30 p.m. Monday - Friday. Please note that voicemails left after 4:00 p.m. may not be returned until the following business day.  We are  closed weekends and major holidays. You have access to a nurse at all times for urgent questions. Please call the main number to the clinic [phone] and follow the prompts.  For any non-urgent questions, you may also contact your provider using MyChart. We now offer e-Visits for anyone 72 and older to request care online for non-urgent symptoms. For details visit [email or web address].   Also download the MyChart app! Go to the app store, search  "MyChart", open the app, select [facility], and log in with your MyChart username and password.  Masks are optional in the cancer centers. If you would like for your care team to wear a mask while they are taking care of you, please let them know. You may have one support person who is at least 74 years old accompany you for your appointments.

## 2022-06-25 NOTE — Progress Notes (Signed)
Dara Otoe given today per MD orders. Tolerated infusion without adverse affects. Vital signs stable. No complaints at this time. Discharged from clinic ambulatory in stable condition. Alert and oriented x 3. F/U with [facility] as scheduled.

## 2022-06-25 NOTE — Progress Notes (Signed)
Patient has been examined by Dr. Katragadda, and vital signs and labs have been reviewed. ANC, Creatinine, LFTs, hemoglobin, and platelets are within treatment parameters per M.D. - pt may proceed with treatment.  Primary RN and pharmacy notified.  

## 2022-06-25 NOTE — Progress Notes (Signed)
Patient presents today for treatment and follow up visit with Dr. Delton Coombes. Vital signs within parameters for treatment. Creatinine 1.54 and platelets 98. Labs reviewed by Dr. Delton Coombes and message received to primary RN to proceed with treatment. Patient took all premedications prior to arrival.

## 2022-06-25 NOTE — Patient Instructions (Signed)
[place] at [facility] Discharge Instructions   You were seen and examined today by Dr. Delton Coombes.  He reviewed the results of your lab work which are normal/stable.   We will proceed with the Darzalex injection today.  Return as scheduled.    Thank you for choosing [place] at [facility] to provide your oncology and hematology care.  To afford each patient quality time with our provider, please arrive at least 15 minutes before your scheduled appointment time.   If you have a lab appointment with the [facility] please come in thru the Main Entrance and check in at the main information desk.  You need to re-schedule your appointment should you arrive 10 or more minutes late.  We strive to give you quality time with our providers, and arriving late affects you and other patients whose appointments are after yours.  Also, if you no show three or more times for appointments you may be dismissed from the clinic at the providers discretion.     Again, thank you for choosing [facility].  Our hope is that these requests will decrease the amount of time that you wait before being seen by our physicians.       _____________________________________________________________  Should you have questions after your visit to [facility], please contact our office at [phone] and follow the prompts.  Our office hours are 8:00 a.m. and 4:30 p.m. Monday - Friday.  Please note that voicemails left after 4:00 p.m. may not be returned until the following business day.  We are closed weekends and major holidays.  You do have access to a nurse 24-7, just call the main number to the clinic [phone] and do not press any options, hold on the line and a nurse will answer the phone.    For prescription refill requests, have your pharmacy contact our office and allow 72 hours.    Due to Covid, you will need to wear a mask upon  entering the hospital. If you do not have a mask, a mask will be given to you at the Main Entrance upon arrival. For doctor visits, patients may have 1 support person age 30 or older with them. For treatment visits, patients can not have anyone with them due to social distancing guidelines and our immunocompromised population.

## 2022-06-26 ENCOUNTER — Other Ambulatory Visit: Payer: Self-pay

## 2022-06-26 DIAGNOSIS — F419 Anxiety disorder, unspecified: Secondary | ICD-10-CM

## 2022-06-26 DIAGNOSIS — C9000 Multiple myeloma not having achieved remission: Secondary | ICD-10-CM

## 2022-06-26 MED ORDER — ALPRAZOLAM 0.5 MG PO TABS: ORAL_TABLET | ORAL | 3 refills | Status: DC

## 2022-06-27 ENCOUNTER — Other Ambulatory Visit: Payer: Self-pay

## 2022-06-27 MED ORDER — HYDROCODONE-ACETAMINOPHEN 10-325 MG PO TABS: 1.0000 | ORAL_TABLET | Freq: Four times a day (QID) | ORAL | 0 refills | Status: DC | PRN

## 2022-06-28 ENCOUNTER — Encounter: Payer: Self-pay | Admitting: Hematology

## 2022-06-28 NOTE — Progress Notes (Signed)
                                                                                                                                                             Patient Name: Alexandra Price MRN: [id number] DOB: March 11, 1946 Referring Physician: [name] Date of Service: 06/20/2022 [facility], [place]                                                        End Of Treatment Note  Diagnoses: C90.00-Multiple myeloma not having achieved remission  Cancer Staging: Multiple Myeloma not having achieved remission  Intent: Curative  Radiation Treatment Dates: 06/07/2022 through 06/20/2022 Site Technique Total Dose (Gy) Dose per Fx (Gy) Completed Fx Beam Energies  Thoracic Spine: Spine_T11 Complex 25/25 2.5 10/10 10X, 15X   Narrative: The patient tolerated radiation therapy relatively well.   Plan: The patient will receive a call in about one month from the radiation oncology department. She will continue follow up with Dr. Delton Coombes as well.   ________________________________________________    Carola Rhine, [facility]

## 2022-07-02 ENCOUNTER — Inpatient Hospital Stay: Payer: Medicare Other

## 2022-07-02 ENCOUNTER — Inpatient Hospital Stay: Payer: Medicare Other | Admitting: Hematology

## 2022-07-02 DIAGNOSIS — C9000 Multiple myeloma not having achieved remission: Secondary | ICD-10-CM

## 2022-07-02 DIAGNOSIS — Z5112 Encounter for antineoplastic immunotherapy: Secondary | ICD-10-CM | POA: Diagnosis not present

## 2022-07-02 LAB — CBC WITH DIFFERENTIAL/PLATELET
Abs Immature Granulocytes: 0.01 10*3/uL (ref 0.00–0.07)
Basophils Absolute: 0 10*3/uL (ref 0.0–0.1)
Basophils Relative: 1 %
Eosinophils Absolute: 0.1 10*3/uL (ref 0.0–0.5)
Eosinophils Relative: 3 %
HCT: 30.2 % — ABNORMAL LOW (ref 36.0–46.0)
Hemoglobin: 9.7 g/dL — ABNORMAL LOW (ref 12.0–15.0)
Immature Granulocytes: 0 %
Lymphocytes Relative: 13 %
Lymphs Abs: 0.4 10*3/uL — ABNORMAL LOW (ref 0.7–4.0)
MCH: 33.3 pg (ref 26.0–34.0)
MCHC: 32.1 g/dL (ref 30.0–36.0)
MCV: 103.8 fL — ABNORMAL HIGH (ref 80.0–100.0)
Monocytes Absolute: 0.3 10*3/uL (ref 0.1–1.0)
Monocytes Relative: 9 %
Neutro Abs: 2.1 10*3/uL (ref 1.7–7.7)
Neutrophils Relative %: 74 %
Platelets: 104 10*3/uL — ABNORMAL LOW (ref 150–400)
RBC: 2.91 MIL/uL — ABNORMAL LOW (ref 3.87–5.11)
RDW: 13.7 % (ref 11.5–15.5)
WBC: 2.8 10*3/uL — ABNORMAL LOW (ref 4.0–10.5)
nRBC: 0 % (ref 0.0–0.2)

## 2022-07-02 LAB — COMPREHENSIVE METABOLIC PANEL
ALT: 38 U/L (ref 0–44)
AST: 26 U/L (ref 15–41)
Albumin: 3.5 g/dL (ref 3.5–5.0)
Alkaline Phosphatase: 92 U/L (ref 38–126)
Anion gap: 7 (ref 5–15)
BUN: 22 mg/dL (ref 8–23)
CO2: 26 mmol/L (ref 22–32)
Calcium: 8.4 mg/dL — ABNORMAL LOW (ref 8.9–10.3)
Chloride: 105 mmol/L (ref 98–111)
Creatinine, Ser: 1.51 mg/dL — ABNORMAL HIGH (ref 0.44–1.00)
GFR, Estimated: 36 mL/min — ABNORMAL LOW (ref >60)
Glucose, Bld: 209 mg/dL — ABNORMAL HIGH (ref 70–99)
Potassium: 4 mmol/L (ref 3.5–5.1)
Sodium: 138 mmol/L (ref 135–145)
Total Bilirubin: 0.4 mg/dL (ref 0.3–1.2)
Total Protein: 6.4 g/dL — ABNORMAL LOW (ref 6.5–8.1)

## 2022-07-02 LAB — MAGNESIUM: Magnesium: 1.8 mg/dL (ref 1.7–2.4)

## 2022-07-02 MED ORDER — BORTEZOMIB CHEMO SQ INJECTION 3.5 MG (2.5MG/ML)
1.3000 mg/m2 | Freq: Once | INTRAMUSCULAR | Status: AC
  Administered 2022-07-02: 2.5 mg via SUBCUTANEOUS
  Filled 2022-07-02: qty 1

## 2022-07-02 MED ORDER — HEPARIN SOD (PORK) LOCK FLUSH 100 UNIT/ML IV SOLN: 500.0000 [IU] | Freq: Once | INTRAVENOUS | Status: DC

## 2022-07-02 MED ORDER — DARATUMUMAB-HYALURONIDASE-FIHJ 1800-30000 MG-UT/15ML ~~LOC~~ SOLN
1800.0000 mg | Freq: Once | SUBCUTANEOUS | Status: AC
  Administered 2022-07-02: 1800 mg via SUBCUTANEOUS
  Filled 2022-07-02: qty 15

## 2022-07-02 MED ORDER — SODIUM CHLORIDE 0.9% FLUSH
10.0000 mL | Freq: Once | INTRAVENOUS | Status: AC
  Administered 2022-07-02: 10 mL via INTRAVENOUS

## 2022-07-02 NOTE — Patient Instructions (Signed)
[place]  Discharge Instructions: Thank you for choosing [facility] to provide your oncology and hematology care.  If you have a lab appointment with the [facility], please come in thru the Main Entrance and check in at the main information desk.  Wear comfortable clothing and clothing appropriate for easy access to any Portacath or PICC line.   We strive to give you quality time with your provider. You may need to reschedule your appointment if you arrive late (15 or more minutes).  Arriving late affects you and other patients whose appointments are after yours.  Also, if you miss three or more appointments without notifying the office, you may be dismissed from the clinic at the provider's discretion.      For prescription refill requests, have your pharmacy contact our office and allow 72 hours for refills to be completed.    Today you received the following chemotherapy and/or immunotherapy agents Velcade Daratumumab      To help prevent nausea and vomiting after your treatment, we encourage you to take your nausea medication as directed.  BELOW ARE SYMPTOMS THAT SHOULD BE REPORTED IMMEDIATELY: *FEVER GREATER THAN 100.4 F (38 C) OR HIGHER *CHILLS OR SWEATING *NAUSEA AND VOMITING THAT IS NOT CONTROLLED WITH YOUR NAUSEA MEDICATION *UNUSUAL SHORTNESS OF BREATH *UNUSUAL BRUISING OR BLEEDING *URINARY PROBLEMS (pain or burning when urinating, or frequent urination) *BOWEL PROBLEMS (unusual diarrhea, constipation, pain near the anus) TENDERNESS IN MOUTH AND THROAT WITH OR WITHOUT PRESENCE OF ULCERS (sore throat, sores in mouth, or a toothache) UNUSUAL RASH, SWELLING OR PAIN  UNUSUAL VAGINAL DISCHARGE OR ITCHING   Items with * indicate a potential emergency and should be followed up as soon as possible or go to the Emergency Department if any problems should occur.  Please show the CHEMOTHERAPY ALERT CARD or IMMUNOTHERAPY ALERT CARD at check-in to  the Emergency Department and triage nurse.  Should you have questions after your visit or need to cancel or reschedule your appointment, please contact [facility] [phone]  and follow the prompts.  Office hours are 8:00 a.m. to 4:30 p.m. Monday - Friday. Please note that voicemails left after 4:00 p.m. may not be returned until the following business day.  We are closed weekends and major holidays. You have access to a nurse at all times for urgent questions. Please call the main number to the clinic [phone] and follow the prompts.  For any non-urgent questions, you may also contact your provider using MyChart. We now offer e-Visits for anyone 28 and older to request care online for non-urgent symptoms. For details visit [email or web address].   Also download the MyChart app! Go to the app store, search "MyChart", open the app, select [place], and log in with your MyChart username and password.  Masks are optional in the cancer centers. If you would like for your care team to wear a mask while they are taking care of you, please let them know. You may have one support person who is at least 74 years old accompany you for your appointments.

## 2022-07-02 NOTE — Progress Notes (Signed)
Patient presents today for Daratumumab and Velcade injections per providers order.  Vital signs within parameters for treatment.  Labs pending.  No new complaints at this time.  Patient took Tylenol, Benadryl and dexamethasone at home prior to appointment.  Treatment given today per MD orders.  Stable during injections without adverse affects.  See MAR for injection sites.  Vital signs stable.  No complaints at this time.  Discharge from clinic ambulatory in stable condition.  Alert and oriented X 3.  Follow up with [facility] as scheduled.

## 2022-07-03 LAB — KAPPA/LAMBDA LIGHT CHAINS
Kappa free light chain: 5.6 mg/L (ref 3.3–19.4)
Kappa, lambda light chain ratio: 0.11 — ABNORMAL LOW (ref 0.26–1.65)
Lambda free light chains: 52.4 mg/L — ABNORMAL HIGH (ref 5.7–26.3)

## 2022-07-05 ENCOUNTER — Inpatient Hospital Stay: Payer: Medicare Other

## 2022-07-05 VITALS — BP 134/75 | HR 64 | Temp 98.0°F | Resp 18

## 2022-07-05 DIAGNOSIS — Z5112 Encounter for antineoplastic immunotherapy: Secondary | ICD-10-CM | POA: Diagnosis not present

## 2022-07-05 DIAGNOSIS — C9000 Multiple myeloma not having achieved remission: Secondary | ICD-10-CM

## 2022-07-05 LAB — PROTEIN ELECTROPHORESIS, SERUM
A/G Ratio: 1.6 (ref 0.7–1.7)
Albumin ELP: 3.7 g/dL (ref 2.9–4.4)
Alpha-1-Globulin: 0.2 g/dL (ref 0.0–0.4)
Alpha-2-Globulin: 0.8 g/dL (ref 0.4–1.0)
Beta Globulin: 0.6 g/dL — ABNORMAL LOW (ref 0.7–1.3)
Gamma Globulin: 0.7 g/dL (ref 0.4–1.8)
Globulin, Total: 2.3 g/dL (ref 2.2–3.9)
M-Spike, %: 0.2 g/dL — ABNORMAL HIGH
Total Protein ELP: 6 g/dL (ref 6.0–8.5)

## 2022-07-05 MED ORDER — BORTEZOMIB CHEMO SQ INJECTION 3.5 MG (2.5MG/ML)
1.3000 mg/m2 | Freq: Once | INTRAMUSCULAR | Status: AC
  Administered 2022-07-05: 2.5 mg via SUBCUTANEOUS
  Filled 2022-07-05: qty 1

## 2022-07-05 NOTE — Progress Notes (Signed)
Patient tolerated Velcade injection with no complaints voiced. Lab work reviewed. See MAR for details. Injection site clean and dry with no bruising or swelling noted. Patient stable during and after injection. Band aid applied. VSS. Patient left in satisfactory condition with no s/s of distress noted.

## 2022-07-05 NOTE — Patient Instructions (Signed)
[place]  Discharge Instructions: Thank you for choosing [facility] to provide your oncology and hematology care.  If you have a lab appointment with the [facility], please come in thru the Main Entrance and check in at the main information desk.  Wear comfortable clothing and clothing appropriate for easy access to any Portacath or PICC line.   We strive to give you quality time with your provider. You may need to reschedule your appointment if you arrive late (15 or more minutes).  Arriving late affects you and other patients whose appointments are after yours.  Also, if you miss three or more appointments without notifying the office, you may be dismissed from the clinic at the provider's discretion.      For prescription refill requests, have your pharmacy contact our office and allow 72 hours for refills to be completed.    Today you received the following chemotherapy and/or immunotherapy agents Velcade, return as scheduled.   To help prevent nausea and vomiting after your treatment, we encourage you to take your nausea medication as directed.  BELOW ARE SYMPTOMS THAT SHOULD BE REPORTED IMMEDIATELY: *FEVER GREATER THAN 100.4 F (38 C) OR HIGHER *CHILLS OR SWEATING *NAUSEA AND VOMITING THAT IS NOT CONTROLLED WITH YOUR NAUSEA MEDICATION *UNUSUAL SHORTNESS OF BREATH *UNUSUAL BRUISING OR BLEEDING *URINARY PROBLEMS (pain or burning when urinating, or frequent urination) *BOWEL PROBLEMS (unusual diarrhea, constipation, pain near the anus) TENDERNESS IN MOUTH AND THROAT WITH OR WITHOUT PRESENCE OF ULCERS (sore throat, sores in mouth, or a toothache) UNUSUAL RASH, SWELLING OR PAIN  UNUSUAL VAGINAL DISCHARGE OR ITCHING   Items with * indicate a potential emergency and should be followed up as soon as possible or go to the Emergency Department if any problems should occur.  Please show the CHEMOTHERAPY ALERT CARD or IMMUNOTHERAPY ALERT CARD at  check-in to the Emergency Department and triage nurse.  Should you have questions after your visit or need to cancel or reschedule your appointment, please contact [facility] [phone]  and follow the prompts.  Office hours are 8:00 a.m. to 4:30 p.m. Monday - Friday. Please note that voicemails left after 4:00 p.m. may not be returned until the following business day.  We are closed weekends and major holidays. You have access to a nurse at all times for urgent questions. Please call the main number to the clinic [phone] and follow the prompts.  For any non-urgent questions, you may also contact your provider using MyChart. We now offer e-Visits for anyone 62 and older to request care online for non-urgent symptoms. For details visit [email or web address].   Also download the MyChart app! Go to the app store, search "MyChart", open the app, select [place], and log in with your MyChart username and password.  Masks are optional in the cancer centers. If you would like for your care team to wear a mask while they are taking care of you, please let them know. You may have one support person who is at least 74 years old accompany you for your appointments.

## 2022-07-09 ENCOUNTER — Inpatient Hospital Stay: Payer: Medicare Other

## 2022-07-09 ENCOUNTER — Inpatient Hospital Stay: Payer: Medicare Other | Admitting: Hematology

## 2022-07-09 DIAGNOSIS — Z5112 Encounter for antineoplastic immunotherapy: Secondary | ICD-10-CM | POA: Diagnosis not present

## 2022-07-09 DIAGNOSIS — Z95828 Presence of other vascular implants and grafts: Secondary | ICD-10-CM

## 2022-07-09 DIAGNOSIS — C9000 Multiple myeloma not having achieved remission: Secondary | ICD-10-CM

## 2022-07-09 LAB — MAGNESIUM: Magnesium: 1.7 mg/dL (ref 1.7–2.4)

## 2022-07-09 LAB — CBC WITH DIFFERENTIAL/PLATELET
Abs Immature Granulocytes: 0.01 10*3/uL (ref 0.00–0.07)
Basophils Absolute: 0 10*3/uL (ref 0.0–0.1)
Basophils Relative: 1 %
Eosinophils Absolute: 0 10*3/uL (ref 0.0–0.5)
Eosinophils Relative: 1 %
HCT: 30.4 % — ABNORMAL LOW (ref 36.0–46.0)
Hemoglobin: 9.8 g/dL — ABNORMAL LOW (ref 12.0–15.0)
Immature Granulocytes: 0 %
Lymphocytes Relative: 10 %
Lymphs Abs: 0.3 10*3/uL — ABNORMAL LOW (ref 0.7–4.0)
MCH: 33.4 pg (ref 26.0–34.0)
MCHC: 32.2 g/dL (ref 30.0–36.0)
MCV: 103.8 fL — ABNORMAL HIGH (ref 80.0–100.0)
Monocytes Absolute: 0.2 10*3/uL (ref 0.1–1.0)
Monocytes Relative: 8 %
Neutro Abs: 2.5 10*3/uL (ref 1.7–7.7)
Neutrophils Relative %: 80 %
Platelets: 71 10*3/uL — ABNORMAL LOW (ref 150–400)
RBC: 2.93 MIL/uL — ABNORMAL LOW (ref 3.87–5.11)
RDW: 14 % (ref 11.5–15.5)
WBC: 3.1 10*3/uL — ABNORMAL LOW (ref 4.0–10.5)
nRBC: 0 % (ref 0.0–0.2)

## 2022-07-09 LAB — COMPREHENSIVE METABOLIC PANEL
ALT: 38 U/L (ref 0–44)
AST: 21 U/L (ref 15–41)
Albumin: 3.6 g/dL (ref 3.5–5.0)
Alkaline Phosphatase: 77 U/L (ref 38–126)
Anion gap: 6 (ref 5–15)
BUN: 37 mg/dL — ABNORMAL HIGH (ref 8–23)
CO2: 25 mmol/L (ref 22–32)
Calcium: 8.1 mg/dL — ABNORMAL LOW (ref 8.9–10.3)
Chloride: 106 mmol/L (ref 98–111)
Creatinine, Ser: 1.52 mg/dL — ABNORMAL HIGH (ref 0.44–1.00)
GFR, Estimated: 36 mL/min — ABNORMAL LOW (ref >60)
Glucose, Bld: 155 mg/dL — ABNORMAL HIGH (ref 70–99)
Potassium: 4.2 mmol/L (ref 3.5–5.1)
Sodium: 137 mmol/L (ref 135–145)
Total Bilirubin: 0.6 mg/dL (ref 0.3–1.2)
Total Protein: 6.6 g/dL (ref 6.5–8.1)

## 2022-07-09 MED ORDER — SODIUM CHLORIDE 0.9% FLUSH
10.0000 mL | Freq: Once | INTRAVENOUS | Status: AC
  Administered 2022-07-09: 10 mL via INTRAVENOUS

## 2022-07-09 MED ORDER — BORTEZOMIB CHEMO SQ INJECTION 3.5 MG (2.5MG/ML)
1.3000 mg/m2 | Freq: Once | INTRAMUSCULAR | Status: AC
  Administered 2022-07-09: 2.5 mg via SUBCUTANEOUS
  Filled 2022-07-09: qty 1

## 2022-07-09 MED ORDER — HEPARIN SOD (PORK) LOCK FLUSH 100 UNIT/ML IV SOLN
500.0000 [IU] | Freq: Once | INTRAVENOUS | Status: AC
  Administered 2022-07-09: 500 [IU] via INTRAVENOUS

## 2022-07-09 MED ORDER — DARATUMUMAB-HYALURONIDASE-FIHJ 1800-30000 MG-UT/15ML ~~LOC~~ SOLN
1800.0000 mg | Freq: Once | SUBCUTANEOUS | Status: AC
  Administered 2022-07-09: 1800 mg via SUBCUTANEOUS
  Filled 2022-07-09: qty 15

## 2022-07-09 NOTE — Patient Instructions (Signed)
Alexandra Price  Discharge Instructions: Thank you for choosing [facility] to provide your oncology and hematology care.  If you have a lab appointment with the [facility], please come in thru the Main Entrance and check in at the main information desk.  Wear comfortable clothing and clothing appropriate for easy access to any Portacath or PICC line.   We strive to give you quality time with your provider. You may need to reschedule your appointment if you arrive late (15 or more minutes).  Arriving late affects you and other patients whose appointments are after yours.  Also, if you miss three or more appointments without notifying the office, you may be dismissed from the clinic at the provider's discretion.      For prescription refill requests, have your pharmacy contact our office and allow 72 hours for refills to be completed.    Today you received the following chemotherapy and/or immunotherapy agents Dara and Velcade, return as scheduled.   To help prevent nausea and vomiting after your treatment, we encourage you to take your nausea medication as directed.  BELOW ARE SYMPTOMS THAT SHOULD BE REPORTED IMMEDIATELY: *FEVER GREATER THAN 100.4 F (38 C) OR HIGHER *CHILLS OR SWEATING *NAUSEA AND VOMITING THAT IS NOT CONTROLLED WITH YOUR NAUSEA MEDICATION *UNUSUAL SHORTNESS OF BREATH *UNUSUAL BRUISING OR BLEEDING *URINARY PROBLEMS (pain or burning when urinating, or frequent urination) *BOWEL PROBLEMS (unusual diarrhea, constipation, pain near the anus) TENDERNESS IN MOUTH AND THROAT WITH OR WITHOUT PRESENCE OF ULCERS (sore throat, sores in mouth, or a toothache) UNUSUAL RASH, SWELLING OR PAIN  UNUSUAL VAGINAL DISCHARGE OR ITCHING   Items with * indicate a potential emergency and should be followed up as soon as possible or go to the Emergency Department if any problems should occur.  Please show the CHEMOTHERAPY ALERT CARD or IMMUNOTHERAPY ALERT CARD  at check-in to the Emergency Department and triage nurse.  Should you have questions after your visit or need to cancel or reschedule your appointment, please contact [facility] [phone]  and follow the prompts.  Office hours are 8:00 a.m. to 4:30 p.m. Monday - Friday. Please note that voicemails left after 4:00 p.m. may not be returned until the following business day.  We are closed weekends and major holidays. You have access to a nurse at all times for urgent questions. Please call the main number to the clinic [phone] and follow the prompts.  For any non-urgent questions, you may also contact your provider using MyChart. We now offer e-Visits for anyone 20 and older to request care online for non-urgent symptoms. For details visit [email or web address].   Also download the MyChart app! Go to the app store, search "MyChart", open the app, select [facility], and log in with your MyChart username and password.  Masks are optional in the cancer centers. If you would like for your care team to wear a mask while they are taking care of you, please let them know. You may have one support person who is at least 74 years old accompany you for your appointments.

## 2022-07-09 NOTE — Progress Notes (Signed)
Patient presents today for Velcade and Daratumumab, patient reports taking pre-medications at home. Platelets 71 Dr. Delton Coombes made aware patient okay for treatment per MD. Patient tolerated Velcade injection with no complaints voiced. Lab work reviewed. See MAR for details. Injection site clean and dry with no bruising or swelling noted. Patient stable during and after injection. Band aid applied.  Patient tolerated Daratumumab injection with no complaints voiced. See MAR for details. Lab reviewed. Injection site clean and dry with no bruising or swelling noted at site. Band aid applied. Vss with discharge and left in satisfactory condition with nos/s of distress noted.

## 2022-07-16 ENCOUNTER — Inpatient Hospital Stay: Payer: Medicare Other

## 2022-07-16 ENCOUNTER — Inpatient Hospital Stay: Payer: Medicare Other | Admitting: Hematology

## 2022-07-16 DIAGNOSIS — Z5112 Encounter for antineoplastic immunotherapy: Secondary | ICD-10-CM | POA: Diagnosis not present

## 2022-07-16 DIAGNOSIS — C9000 Multiple myeloma not having achieved remission: Secondary | ICD-10-CM

## 2022-07-16 LAB — CBC WITH DIFFERENTIAL/PLATELET
Abs Immature Granulocytes: 0.01 10*3/uL (ref 0.00–0.07)
Basophils Absolute: 0 10*3/uL (ref 0.0–0.1)
Basophils Relative: 1 %
Eosinophils Absolute: 0 10*3/uL (ref 0.0–0.5)
Eosinophils Relative: 1 %
HCT: 31 % — ABNORMAL LOW (ref 36.0–46.0)
Hemoglobin: 10 g/dL — ABNORMAL LOW (ref 12.0–15.0)
Immature Granulocytes: 0 %
Lymphocytes Relative: 5 %
Lymphs Abs: 0.2 10*3/uL — ABNORMAL LOW (ref 0.7–4.0)
MCH: 33.7 pg (ref 26.0–34.0)
MCHC: 32.3 g/dL (ref 30.0–36.0)
MCV: 104.4 fL — ABNORMAL HIGH (ref 80.0–100.0)
Monocytes Absolute: 0.3 10*3/uL (ref 0.1–1.0)
Monocytes Relative: 6 %
Neutro Abs: 3.7 10*3/uL (ref 1.7–7.7)
Neutrophils Relative %: 87 %
Platelets: 82 10*3/uL — ABNORMAL LOW (ref 150–400)
RBC: 2.97 MIL/uL — ABNORMAL LOW (ref 3.87–5.11)
RDW: 13.9 % (ref 11.5–15.5)
WBC: 4.2 10*3/uL (ref 4.0–10.5)
nRBC: 0 % (ref 0.0–0.2)

## 2022-07-16 MED ORDER — HEPARIN SOD (PORK) LOCK FLUSH 100 UNIT/ML IV SOLN
500.0000 [IU] | Freq: Once | INTRAVENOUS | Status: AC
  Administered 2022-07-16: 500 [IU] via INTRAVENOUS

## 2022-07-16 MED ORDER — OCTREOTIDE ACETATE 30 MG IM KIT
PACK | INTRAMUSCULAR | Status: AC
  Filled 2022-07-16: qty 1

## 2022-07-16 MED ORDER — SODIUM CHLORIDE 0.9% FLUSH
10.0000 mL | Freq: Once | INTRAVENOUS | Status: AC
  Administered 2022-07-16: 10 mL

## 2022-07-16 MED ORDER — DARATUMUMAB-HYALURONIDASE-FIHJ 1800-30000 MG-UT/15ML ~~LOC~~ SOLN
1800.0000 mg | Freq: Once | SUBCUTANEOUS | Status: AC
  Administered 2022-07-16: 1800 mg via SUBCUTANEOUS
  Filled 2022-07-16: qty 15

## 2022-07-16 MED ORDER — BORTEZOMIB CHEMO SQ INJECTION 3.5 MG (2.5MG/ML)
1.3000 mg/m2 | Freq: Once | INTRAMUSCULAR | Status: AC
  Administered 2022-07-16: 2.5 mg via SUBCUTANEOUS
  Filled 2022-07-16: qty 1

## 2022-07-16 NOTE — Progress Notes (Signed)
Add Velcade to today's treatment due to missed dose 11/23 due to holiday.  Order entered per above: Velcade 1.3 mg/m2  T.O. Dr Rhys Martini, PharmD

## 2022-07-16 NOTE — Patient Instructions (Signed)
[employer]  Discharge Instructions: Thank you for choosing [facility] to provide your oncology and hematology care.  If you have a lab appointment with the [facility], please come in thru the Main Entrance and check in at the main information desk.  Wear comfortable clothing and clothing appropriate for easy access to any Portacath or PICC line.   We strive to give you quality time with your provider. You may need to reschedule your appointment if you arrive late (15 or more minutes).  Arriving late affects you and other patients whose appointments are after yours.  Also, if you miss three or more appointments without notifying the office, you may be dismissed from the clinic at the provider's discretion.      For prescription refill requests, have your pharmacy contact our office and allow 72 hours for refills to be completed.    Today you received the following chemotherapy and/or immunotherapy agents Velcade and Darzalex Faspro. Daratumumab; Hyaluronidase Injection What is this medication? DARATUMUMAB; HYALURONIDASE (dar a toom ue mab; hye al ur ON i dase) treats multiple myeloma, a type of bone marrow cancer. Daratumumab works by blocking a protein that causes cancer cells to grow and multiply. This helps to slow or stop the spread of cancer cells. Hyaluronidase works by increasing the absorption of other medications in the body to help them work better. This medication may also be used treat amyloidosis, a condition that causes the buildup of a protein (amyloid) in your body. It works by reducing the buildup of this protein, which decreases symptoms. It is a combination medication that contains a monoclonal antibody. This medicine may be used for other purposes; ask your health care provider or pharmacist if you have questions. COMMON BRAND NAME(S): DARZALEX FASPRO What should I tell my care team before I take this medication? They need to know if you  have any of these conditions: Heart disease Infection, such as chickenpox, cold sores, herpes, hepatitis B Lung or breathing disease An unusual or allergic reaction to daratumumab, hyaluronidase, other medications, foods, dyes, or preservatives Pregnant or trying to get pregnant Breast-feeding How should I use this medication? This medication is injected under the skin. It is given by your care team in a hospital or clinic setting. Talk to your care team about the use of this medication in children. Special care may be needed. Overdosage: If you think you have taken too much of this medicine contact a poison control center or emergency room at once. NOTE: This medicine is only for you. Do not share this medicine with others. What if I miss a dose? Keep appointments for follow-up doses. It is important not to miss your dose. Call your care team if you are unable to keep an appointment. What may interact with this medication? Interactions have not been studied. This list may not describe all possible interactions. Give your health care provider a list of all the medicines, herbs, non-prescription drugs, or dietary supplements you use. Also tell them if you smoke, drink alcohol, or use illegal drugs. Some items may interact with your medicine. What should I watch for while using this medication? Your condition will be monitored carefully while you are receiving this medication. This medication can cause serious allergic reactions. To reduce your risk, your care team may give you other medication to take before receiving this one. Be sure to follow the directions from your care team. This medication can affect the results of blood tests  to match your blood type. These changes can last for up to 6 months after the final dose. Your care team will do blood tests to match your blood type before you start treatment. Tell all of your care team that you are being treated with this medication before  receiving a blood transfusion. This medication can affect the results of some tests used to determine treatment response; extra tests may be needed to evaluate response. Talk to your care team if you wish to become pregnant or think you are pregnant. This medication can cause serious birth defects if taken during pregnancy and for 3 months after the last dose. A reliable form of contraception is recommended while taking this medication and for 3 months after the last dose. Talk to your care team about effective forms of contraception. Do not breast-feed while taking this medication. What side effects may I notice from receiving this medication? Side effects that you should report to your care team as soon as possible: Allergic reactions--skin rash, itching, hives, swelling of the face, lips, tongue, or throat Heart rhythm changes--fast or irregular heartbeat, dizziness, feeling faint or lightheaded, chest pain, trouble breathing Infection--fever, chills, cough, sore throat, wounds that don't heal, pain or trouble when passing urine, general feeling of discomfort or being unwell Infusion reactions--chest pain, shortness of breath or trouble breathing, feeling faint or lightheaded Sudden eye pain or change in vision such as blurry vision, seeing halos around lights, vision loss Unusual bruising or bleeding Side effects that usually do not require medical attention (report to your care team if they continue or are bothersome): Constipation Diarrhea Fatigue Nausea Pain, tingling, or numbness in the hands or feet Swelling of the ankles, hands, or feet This list may not describe all possible side effects. Call your doctor for medical advice about side effects. You may report side effects to FDA at 1-800-FDA-1088. Where should I keep my medication? This medication is given in a hospital or clinic. It will not be stored at home. NOTE: This sheet is a summary. It may not cover all possible information.  If you have questions about this medicine, talk to your doctor, pharmacist, or health care provider.  2023 Elsevier/Gold Standard (2021-11-29 00:00:00) Bortezomib Injection What is this medication? BORTEZOMIB (bor TEZ oh mib) treats lymphoma. It may also be used to treat multiple myeloma, a type of bone marrow cancer. It works by blocking a protein that causes cancer cells to grow and multiply. This helps to slow or stop the spread of cancer cells. This medicine may be used for other purposes; ask your health care provider or pharmacist if you have questions. COMMON BRAND NAME(S): Velcade What should I tell my care team before I take this medication? They need to know if you have any of these conditions: Dehydration Diabetes Heart disease Liver disease Tingling of the fingers or toes or other nerve disorder An unusual or allergic reaction to bortezomib, other medications, foods, dyes, or preservatives If you or your partner are pregnant or trying to get pregnant Breastfeeding How should I use this medication? This medication is injected into a vein or under the skin. It is given by your care team in a hospital or clinic setting. Talk to your care team about the use of this medication in children. Special care may be needed. Overdosage: If you think you have taken too much of this medicine contact a poison control center or emergency room at once. NOTE: This medicine is only for you. Do not  share this medicine with others. What if I miss a dose? Keep appointments for follow-up doses. It is important not to miss your dose. Call your care team if you are unable to keep an appointment. What may interact with this medication? Ketoconazole Rifampin This list may not describe all possible interactions. Give your health care provider a list of all the medicines, herbs, non-prescription drugs, or dietary supplements you use. Also tell them if you smoke, drink alcohol, or use illegal drugs. Some  items may interact with your medicine. What should I watch for while using this medication? Your condition will be monitored carefully while you are receiving this medication. You may need blood work while taking this medication. This medication may affect your coordination, reaction time, or judgment. Do not drive or operate machinery until you know how this medication affects you. Sit up or stand slowly to reduce the risk of dizzy or fainting spells. Drinking alcohol with this medication can increase the risk of these side effects. This medication may increase your risk of getting an infection. Call your care team for advice if you get a fever, chills, sore throat, or other symptoms of a cold or flu. Do not treat yourself. Try to avoid being around people who are sick. Check with your care team if you have severe diarrhea, nausea, and vomiting, or if you sweat a lot. The loss of too much body fluid may make it dangerous for you to take this medication. Talk to your care team if you may be pregnant. Serious birth defects can occur if you take this medication during pregnancy and for 7 months after the last dose. You will need a negative pregnancy test before starting this medication. Contraception is recommended while taking this medication and for 7 months after the last dose. Your care team can help you find the option that works for you. If your partner can get pregnant, use a condom during sex while taking this medication and for 4 months after the last dose. Do not breastfeed while taking this medication and for 2 months after the last dose. This medication may cause infertility. Talk to your care team if you are concerned about your fertility. What side effects may I notice from receiving this medication? Side effects that you should report to your care team as soon as possible: Allergic reactions--skin rash, itching, hives, swelling of the face, lips, tongue, or throat Bleeding--bloody or black,  tar-like stools, vomiting blood or brown material that looks like coffee grounds, red or dark brown urine, small red or purple spots on skin, unusual bruising or bleeding Bleeding in the brain--severe headache, stiff neck, confusion, dizziness, change in vision, numbness or weakness of the face, arm, or leg, trouble speaking, trouble walking, vomiting Bowel blockage--stomach cramping, unable to have a bowel movement or pass gas, loss of appetite, vomiting Heart failure--shortness of breath, swelling of the ankles, feet, or hands, sudden weight gain, unusual weakness or fatigue Infection--fever, chills, cough, sore throat, wounds that don't heal, pain or trouble when passing urine, general feeling of discomfort or being unwell Liver injury--right upper belly pain, loss of appetite, nausea, light-colored stool, dark yellow or brown urine, yellowing skin or eyes, unusual weakness or fatigue Low blood pressure--dizziness, feeling faint or lightheaded, blurry vision Lung injury--shortness of breath or trouble breathing, cough, spitting up blood, chest pain, fever Pain, tingling, or numbness in the hands or feet Severe or prolonged diarrhea Stomach pain, bloody diarrhea, pale skin, unusual weakness or fatigue, decrease in  the amount of urine, which may be signs of hemolytic uremic syndrome Sudden and severe headache, confusion, change in vision, seizures, which may be signs of posterior reversible encephalopathy syndrome (PRES) TTP--purple spots on the skin or inside the mouth, pale skin, yellowing skin or eyes, unusual weakness or fatigue, fever, fast or irregular heartbeat, confusion, change in vision, trouble speaking, trouble walking Tumor lysis syndrome (TLS)--nausea, vomiting, diarrhea, decrease in the amount of urine, dark urine, unusual weakness or fatigue, confusion, muscle pain or cramps, fast or irregular heartbeat, joint pain Side effects that usually do not require medical attention (report to  your care team if they continue or are bothersome): Constipation Diarrhea Fatigue Loss of appetite Nausea This list may not describe all possible side effects. Call your doctor for medical advice about side effects. You may report side effects to FDA at 1-800-FDA-1088. Where should I keep my medication? This medication is given in a hospital or clinic. It will not be stored at home. NOTE: This sheet is a summary. It may not cover all possible information. If you have questions about this medicine, talk to your doctor, pharmacist, or health care provider.  2023 Elsevier/Gold Standard (2022-01-03 00:00:00)       To help prevent nausea and vomiting after your treatment, we encourage you to take your nausea medication as directed.  BELOW ARE SYMPTOMS THAT SHOULD BE REPORTED IMMEDIATELY: *FEVER GREATER THAN 100.4 F (38 C) OR HIGHER *CHILLS OR SWEATING *NAUSEA AND VOMITING THAT IS NOT CONTROLLED WITH YOUR NAUSEA MEDICATION *UNUSUAL SHORTNESS OF BREATH *UNUSUAL BRUISING OR BLEEDING *URINARY PROBLEMS (pain or burning when urinating, or frequent urination) *BOWEL PROBLEMS (unusual diarrhea, constipation, pain near the anus) TENDERNESS IN MOUTH AND THROAT WITH OR WITHOUT PRESENCE OF ULCERS (sore throat, sores in mouth, or a toothache) UNUSUAL RASH, SWELLING OR PAIN  UNUSUAL VAGINAL DISCHARGE OR ITCHING   Items with * indicate a potential emergency and should be followed up as soon as possible or go to the Emergency Department if any problems should occur.  Please show the CHEMOTHERAPY ALERT CARD or IMMUNOTHERAPY ALERT CARD at check-in to the Emergency Department and triage nurse.  Should you have questions after your visit or need to cancel or reschedule your appointment, please contact [facility] [phone]  and follow the prompts.  Office hours are 8:00 a.m. to 4:30 p.m. Monday - Friday. Please note that voicemails left after 4:00 p.m. may not be returned until the  following business day.  We are closed weekends and major holidays. You have access to a nurse at all times for urgent questions. Please call the main number to the clinic [phone] and follow the prompts.  For any non-urgent questions, you may also contact your provider using MyChart. We now offer e-Visits for anyone 76 and older to request care online for non-urgent symptoms. For details visit [email or web address].   Also download the MyChart app! Go to the app store, search "MyChart", open the app, select , and log in with your MyChart username and password.  Masks are optional in the cancer centers. If you would like for your care team to wear a mask while they are taking care of you, please let them know. You may have one support person who is at least 74 years old accompany you for your appointments.

## 2022-07-16 NOTE — Progress Notes (Signed)
Okay per Dr. Delton Coombes to proceed with labs from 11/20 for velcade today.

## 2022-07-16 NOTE — Progress Notes (Signed)
Patient presents today for treament. Darzalex Faspro injection. Vital signs within parameter for treatment. MAR reviewed and updated. Labs pending. Patient denies any side effects related to treatment and has no complaints of any changes today. Patient states pre-medications were taken prior to arrival at 08:30 am. Tylenol 650, Benadryl 50 mg and Decadron 20 mg PO.   Message received from Dr. Delton Coombes / Jerilynn Mages. Edwards RN to proceed with treatment. Platelets 82 and MD aware. Patient to receive Velcade and Darzalex Faspro today per Dr. Vickey Huger. Pharmacy aware and adjustments made.   Treatment given today per MD orders. Tolerated without adverse affects. Vital signs stable. No complaints at this time. Discharged from clinic ambulatory in stable condition. Alert and oriented x 3. F/U with [facility] as scheduled.

## 2022-07-23 ENCOUNTER — Inpatient Hospital Stay: Payer: Medicare Other

## 2022-07-23 ENCOUNTER — Inpatient Hospital Stay (HOSPITAL_BASED_OUTPATIENT_CLINIC_OR_DEPARTMENT_OTHER): Payer: Medicare Other | Admitting: Hematology

## 2022-07-23 ENCOUNTER — Inpatient Hospital Stay: Payer: Medicare Other | Attending: Hematology

## 2022-07-23 DIAGNOSIS — Z7901 Long term (current) use of anticoagulants: Secondary | ICD-10-CM | POA: Insufficient documentation

## 2022-07-23 DIAGNOSIS — Z95828 Presence of other vascular implants and grafts: Secondary | ICD-10-CM

## 2022-07-23 DIAGNOSIS — C9000 Multiple myeloma not having achieved remission: Secondary | ICD-10-CM

## 2022-07-23 DIAGNOSIS — Z86711 Personal history of pulmonary embolism: Secondary | ICD-10-CM | POA: Diagnosis not present

## 2022-07-23 DIAGNOSIS — Z5112 Encounter for antineoplastic immunotherapy: Secondary | ICD-10-CM | POA: Insufficient documentation

## 2022-07-23 DIAGNOSIS — D631 Anemia in chronic kidney disease: Secondary | ICD-10-CM | POA: Insufficient documentation

## 2022-07-23 DIAGNOSIS — N189 Chronic kidney disease, unspecified: Secondary | ICD-10-CM | POA: Insufficient documentation

## 2022-07-23 LAB — CBC WITH DIFFERENTIAL/PLATELET
Abs Immature Granulocytes: 0.01 10*3/uL (ref 0.00–0.07)
Basophils Absolute: 0 10*3/uL (ref 0.0–0.1)
Basophils Relative: 0 %
Eosinophils Absolute: 0.1 10*3/uL (ref 0.0–0.5)
Eosinophils Relative: 2 %
HCT: 31.2 % — ABNORMAL LOW (ref 36.0–46.0)
Hemoglobin: 10 g/dL — ABNORMAL LOW (ref 12.0–15.0)
Immature Granulocytes: 0 %
Lymphocytes Relative: 7 %
Lymphs Abs: 0.3 10*3/uL — ABNORMAL LOW (ref 0.7–4.0)
MCH: 33.4 pg (ref 26.0–34.0)
MCHC: 32.1 g/dL (ref 30.0–36.0)
MCV: 104.3 fL — ABNORMAL HIGH (ref 80.0–100.0)
Monocytes Absolute: 0.3 10*3/uL (ref 0.1–1.0)
Monocytes Relative: 7 %
Neutro Abs: 3.2 10*3/uL (ref 1.7–7.7)
Neutrophils Relative %: 84 %
Platelets: 103 10*3/uL — ABNORMAL LOW (ref 150–400)
RBC: 2.99 MIL/uL — ABNORMAL LOW (ref 3.87–5.11)
RDW: 13.8 % (ref 11.5–15.5)
WBC: 3.8 10*3/uL — ABNORMAL LOW (ref 4.0–10.5)
nRBC: 0 % (ref 0.0–0.2)

## 2022-07-23 LAB — COMPREHENSIVE METABOLIC PANEL
ALT: 26 U/L (ref 0–44)
AST: 19 U/L (ref 15–41)
Albumin: 3.5 g/dL (ref 3.5–5.0)
Alkaline Phosphatase: 85 U/L (ref 38–126)
Anion gap: 9 (ref 5–15)
BUN: 27 mg/dL — ABNORMAL HIGH (ref 8–23)
CO2: 27 mmol/L (ref 22–32)
Calcium: 8.7 mg/dL — ABNORMAL LOW (ref 8.9–10.3)
Chloride: 104 mmol/L (ref 98–111)
Creatinine, Ser: 1.4 mg/dL — ABNORMAL HIGH (ref 0.44–1.00)
GFR, Estimated: 39 mL/min — ABNORMAL LOW (ref >60)
Glucose, Bld: 158 mg/dL — ABNORMAL HIGH (ref 70–99)
Potassium: 3.9 mmol/L (ref 3.5–5.1)
Sodium: 140 mmol/L (ref 135–145)
Total Bilirubin: 0.3 mg/dL (ref 0.3–1.2)
Total Protein: 6.5 g/dL (ref 6.5–8.1)

## 2022-07-23 MED ORDER — HEPARIN SOD (PORK) LOCK FLUSH 100 UNIT/ML IV SOLN
500.0000 [IU] | Freq: Once | INTRAVENOUS | Status: AC
  Administered 2022-07-23: 500 [IU] via INTRAVENOUS

## 2022-07-23 MED ORDER — BORTEZOMIB CHEMO SQ INJECTION 3.5 MG (2.5MG/ML)
1.3000 mg/m2 | Freq: Once | INTRAMUSCULAR | Status: AC
  Administered 2022-07-23: 2.5 mg via SUBCUTANEOUS
  Filled 2022-07-23: qty 1

## 2022-07-23 MED ORDER — SODIUM CHLORIDE 0.9% FLUSH
10.0000 mL | INTRAVENOUS | Status: AC
  Administered 2022-07-23: 10 mL

## 2022-07-23 MED ORDER — DARATUMUMAB-HYALURONIDASE-FIHJ 1800-30000 MG-UT/15ML ~~LOC~~ SOLN
1800.0000 mg | Freq: Once | SUBCUTANEOUS | Status: AC
  Administered 2022-07-23: 1800 mg via SUBCUTANEOUS
  Filled 2022-07-23: qty 15

## 2022-07-23 NOTE — Progress Notes (Signed)
Patient has been examined by Dr. Katragadda, and vital signs and labs have been reviewed. ANC, Creatinine, LFTs, hemoglobin, and platelets are within treatment parameters per M.D. - pt may proceed with treatment.  Primary RN and pharmacy notified.  

## 2022-07-23 NOTE — Progress Notes (Signed)
Patient presents today for Darzalex Faspro and Velcade injection with follow up visit with Dr. Delton Coombes. Vital signs and labs within parameters for treatment. Pre-treatment check list completed.    Message received from A. Beckie Salts / Dr. Maryland Pink to proceed with treatment. Patient took pre-medications prior to arrival at 08:30 am. Tylenol 650 mg, Benadryl 50 mg and Decadron 20 mg PO.   Treatment given today per MD orders. Tolerated without adverse affects. Vital signs stable. No complaints at this time. Discharged from clinic ambulatory in stable condition. Alert and oriented x 3. F/U with [facility] as scheduled.

## 2022-07-23 NOTE — Patient Instructions (Addendum)
[facility] at [facility] Discharge Instructions   You were seen and examined today by Dr. Katragadda.  He reviewed the results of your lab work which are normal/stable.   We will proceed with your treatment today.  Return as scheduled.    Thank you for choosing [facility] at [facility] to provide your oncology and hematology care.  To afford each patient quality time with our provider, please arrive at least 15 minutes before your scheduled appointment time.   If you have a lab appointment with the Cancer Center please come in thru the Main Entrance and check in at the main information desk.  You need to re-schedule your appointment should you arrive 10 or more minutes late.  We strive to give you quality time with our providers, and arriving late affects you and other patients whose appointments are after yours.  Also, if you no show three or more times for appointments you may be dismissed from the clinic at the providers discretion.     Again, thank you for choosing [facility].  Our hope is that these requests will decrease the amount of time that you wait before being seen by our physicians.       _____________________________________________________________  Should you have questions after your visit to  Cancer Center, please contact our office at [phone] and follow the prompts.  Our office hours are 8:00 a.m. and 4:30 p.m. Monday - Friday.  Please note that voicemails left after 4:00 p.m. may not be returned until the following business day.  We are closed weekends and major holidays.  You do have access to a nurse 24-7, just call the main number to the clinic [phone] and do not press any options, hold on the line and a nurse will answer the phone.    For prescription refill requests, have your pharmacy contact our office and allow 72 hours.    Due to Covid, you will need to wear a mask upon entering  the hospital. If you do not have a mask, a mask will be given to you at the Main Entrance upon arrival. For doctor visits, patients may have 1 support person age 18 or older with them. For treatment visits, patients can not have anyone with them due to social distancing guidelines and our immunocompromised population.      

## 2022-07-23 NOTE — Progress Notes (Signed)
[place], [place]  [address]   CLINIC:  Medical Oncology/Hematology  PCP:  [name], MD [address] [place] / [place] [address] [phone]   REASON FOR VISIT:  Follow-up for multiple myeloma  PRIOR THERAPY:  1. KPD x 4 cycles from 04/17/2018 to 08/21/2018. 2. Stem cell transplant on 10/23/2018. 3. Radiation to left and right humerus 25 Gy in 10 fractions from 08/23/2020 to 08/25/2020. 4.  Maintenance Pomalyst until 06/08/2022  CURRENT THERAPY: Darzalex, Velcade and dexamethasone started on 06/11/2022  BRIEF ONCOLOGIC HISTORY:  Oncology History  Multiple myeloma not having achieved remission ([place])  04/07/2018 Initial Diagnosis   Multiple myeloma not having achieved remission ([place])   06/11/2022 -  Chemotherapy   Patient is on Treatment Plan : MYELOMA RELAPSED / REFRACTORY Daratumumab SQ + Bortezomib + Dexamethasone (DaraVd) q21d / Daratumumab SQ q28d      Multiple myeloma without remission ([place])  04/15/2018 Initial Diagnosis   Multiple myeloma without remission ([place])   04/17/2018 - 09/05/2018 Chemotherapy   The patient had dexamethasone (DECADRON) 4 MG tablet, 1 of 1 cycle, Start date: 04/15/2018, End date: 05/26/2018 palonosetron (ALOXI) injection 0.25 mg, 0.25 mg, Intravenous,  Once, 1 of 1 cycle Administration: 0.25 mg (04/17/2018), 0.25 mg (04/24/2018), 0.25 mg (05/01/2018) cyclophosphamide (CYTOXAN) 540 mg in sodium chloride 0.9 % 250 mL chemo infusion, 300 mg/m2 = 540 mg, Intravenous,  Once, 1 of 1 cycle Administration: 540 mg (04/17/2018), 540 mg (04/24/2018), 540 mg (05/01/2018) carfilzomib (KYPROLIS) 36 mg in dextrose 5 % 50 mL chemo infusion, 20 mg/m2 = 36 mg, Intravenous, Once, 5 of 5 cycles Administration: 36 mg (04/17/2018), 36 mg (04/18/2018), 60 mg (04/24/2018), 60 mg (04/25/2018), 60 mg (05/01/2018), 60 mg (05/02/2018), 60 mg (05/15/2018), 60 mg (05/16/2018), 60 mg (06/12/2018), 60 mg (06/13/2018), 60 mg (06/26/2018), 60 mg (06/27/2018), 60  mg (07/03/2018), 60 mg (07/04/2018), 60 mg (07/24/2018), 60 mg (07/25/2018), 60 mg (07/31/2018), 60 mg (08/01/2018), 60 mg (08/08/2018), 60 mg (08/07/2018), 60 mg (08/21/2018), 60 mg (08/22/2018), 60 mg (08/28/2018), 60 mg (08/29/2018), 60 mg (09/04/2018), 60 mg (09/05/2018)  for chemotherapy treatment.    06/11/2022 -  Chemotherapy   Patient is on Treatment Plan : MYELOMA RELAPSED / REFRACTORY Daratumumab SQ + Bortezomib + Dexamethasone (DaraVd) q21d / Daratumumab SQ q28d        CANCER STAGING:  Cancer Staging  No matching staging information was found for the patient.  INTERVAL HISTORY:  Ms. DORITA ROWLANDS, a 76 y.o. female, seen for follow-up of multiple myeloma on toxicity assessment prior to start of cycle 3.  She completed radiation therapy on 06/20/2022.  Denies any tingling or numbness in extremities.  She had upper respiratory infection on Friday with cough and greenish-yellow sputum and was evaluated at Dr. Ansel Bong office.  She was given inhalers, Zyrtec and another medication.  REVIEW OF SYSTEMS:  Review of Systems  Respiratory:  Positive for cough and shortness of breath.   Gastrointestinal:  Positive for diarrhea.  Musculoskeletal:  Positive for back pain (4/10 L side - stable).  Neurological:  Positive for dizziness and numbness (Right shin).  Psychiatric/Behavioral:  Positive for sleep disturbance.   All other systems reviewed and are negative.   PAST MEDICAL/SURGICAL HISTORY:  Past Medical History:  Diagnosis Date   Anxiety    Chronic kidney disease    Depression    Hypertension    Multiple myeloma ([place])    multiple myeloma   Pre-diabetes    Past  Surgical History:  Procedure Laterality Date   ABDOMINAL HYSTERECTOMY     total   APPENDECTOMY     BALLOON DILATION N/A 05/28/2022   Procedure: BALLOON DILATION;  Surgeon: [name], DO;  [facility];  Service: Endoscopy;  Laterality: N/A;   BIOPSY  05/28/2022   Procedure: BIOPSY;  Surgeon: [name], DO;  [facility];  Service: Endoscopy;;   COLONOSCOPY WITH PROPOFOL N/A 12/25/2021   Procedure: COLONOSCOPY WITH PROPOFOL;  Surgeon: [name], DO;  [facility];  Service: Endoscopy;  Laterality: N/A;  2:30pm   ESOPHAGOGASTRODUODENOSCOPY (EGD) WITH PROPOFOL N/A 05/28/2022   Procedure: ESOPHAGOGASTRODUODENOSCOPY (EGD) WITH PROPOFOL;  Surgeon: [name], DO;  [facility];  Service: Endoscopy;  Laterality: N/A;  10:00am, asa 3   LAPAROSCOPIC APPENDECTOMY N/A 05/20/2018   Procedure: APPENDECTOMY LAPAROSCOPIC;  Surgeon: [name], MD;  Location: [facility];  Service: General;  Laterality: N/A;   POLYPECTOMY  12/25/2021   Procedure: POLYPECTOMY;  Surgeon: [name], DO;  [facility];  Service: Endoscopy;;   PORTACATH PLACEMENT Right 04/14/2018   Procedure: INSERTION PORT-A-CATH;  Surgeon: [name], MD;  Location: [facility];  Service: General;  Laterality: Right;    SOCIAL HISTORY:  Social History   Socioeconomic History   Marital status: Legally Separated    Spouse name: Not on file   Number of children: 6   Years of education: Not on file   Highest education level: Not on file  Occupational History    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  Tobacco Use   Smoking status: Never   Smokeless tobacco: Never  Vaping Use   Vaping Use: Never used  Substance and Sexual Activity   Alcohol use: Never   Drug use: Not Currently   Sexual activity: Not Currently  Other Topics Concern   Not on file  Social History Narrative   Not on file   Social Determinants of Health   Financial Resource Strain: Low Risk  (07/06/2020)   Overall Financial Resource Strain (CARDIA)    Difficulty of Paying Living Expenses: Not hard at all  Food Insecurity: No Food Insecurity (07/06/2020)   Hunger Vital Sign    Worried About Running Out of Food in the Last Year: Never true    Ran Out of Food in the Last Year: Never true  Transportation Needs: No  Transportation Needs (07/06/2020)   PRAPARE - Hydrologist (Medical): No    Lack of Transportation (Non-Medical): No  Physical Activity: Inactive (07/06/2020)   Exercise Vital Sign    Days of Exercise per Week: 0 days    Minutes of Exercise per Session: 0 min  Stress: No Stress Concern Present (07/06/2020)   [place]    Feeling of Stress : Not at all  Social Connections: Moderately Isolated (07/06/2020)   Social Connection and Isolation Panel [NHANES]    Frequency of Communication with Friends and Family: More than three times a week    Frequency of Social Gatherings with Friends and Family: Twice a week    Attends Religious Services: 1 to 4 times per year    Active Member of [employer] or Organizations: No    Attends Archivist Meetings: Never    Marital Status: Separated  Intimate Partner Violence: Not At Risk (08/02/2020)   Humiliation, Afraid, Rape, and Kick questionnaire    Fear of Current or Ex-Partner:  No    Emotionally Abused: No    Physically Abused: No    Sexually Abused: No    FAMILY HISTORY:  Family History  Problem Relation Age of Onset   Heart disease Mother    Emphysema Father    Diabetes Sister    Depression Sister    Cancer Brother        liver, lung, and colon. colon cancer at age 38.   Diabetes Brother     CURRENT MEDICATIONS:  Current Outpatient Medications  Medication Sig Dispense Refill   acyclovir (ZOVIRAX) 400 MG tablet Take 1 tablet (400 mg total) by mouth 2 (two) times daily. 60 tablet 6   ALPRAZolam (XANAX) 0.5 MG tablet TAKE 1 TABLET BY MOUTH DURING THE DAY AS NEEDED FOR ANXIETY AND 2 TABLETS AT BEDTIME AS NEEDED FOR ANXIETY/INSOMNIA 90 tablet 3   Calcium Carb-Cholecalciferol (CALCIUM 1000 + D PO) Take 1,000 mg by mouth daily.     clotrimazole-betamethasone (LOTRISONE) cream Apply 1 Application topically 2 (two) times daily as needed  (irritation).     dexAMETHasone 20 MG TABS Take 1 tablet by mouth as directed. Take 1 tablet 30 minutes prior to infusions 30 tablet 2   ELIQUIS 2.5 MG TABS tablet Take 2.5 mg by mouth 2 (two) times daily.     escitalopram (LEXAPRO) 20 MG tablet Take 20 mg by mouth daily.     HYDROcodone-acetaminophen (NORCO) 10-325 MG tablet Take 1 tablet by mouth every 6 (six) hours as needed. 120 tablet 0   ibuprofen (ADVIL) 200 MG tablet Take 200 mg by mouth every 6 (six) hours as needed for moderate pain. 30 tablet 0   montelukast (SINGULAIR) 10 MG tablet TAKE 1 TABLET BY MOUTH AS DIRECTED 30 MINUTES PRIOR TO TREATMENT 90 tablet 0   Multiple Vitamin (MULTI VITAMIN) TABS Take 1 tablet by mouth daily.     pantoprazole (PROTONIX) 40 MG tablet Take 1 tablet (40 mg total) by mouth daily. 90 tablet 6   polyethylene glycol powder (GLYCOLAX/MIRALAX) 17 GM/SCOOP powder Take one capful once to twice daily as needed (Patient taking differently: Take 17 g by mouth daily as needed for moderate constipation.) 255 g 3   prochlorperazine (COMPAZINE) 10 MG tablet Take 1 tablet (10 mg total) by mouth every 6 (six) hours as needed for nausea or vomiting. 30 tablet 0   rosuvastatin (CRESTOR) 10 MG tablet Take 10 mg by mouth daily.     zolpidem (AMBIEN) 10 MG tablet Take 1 tablet (10 mg total) by mouth at bedtime as needed for sleep. 30 tablet 2   No current facility-administered medications for this visit.    ALLERGIES:  Allergies  Allergen Reactions   Ciprofloxacin Anaphylaxis   Amoxicillin Other (See Comments)    unknown   Morphine And Related Nausea And Vomiting   Augmentin [Amoxicillin-Pot Clavulanate] Other (See Comments)    Headache, insomnia    PHYSICAL EXAM:  Performance status (ECOG): 1 - Symptomatic but completely ambulatory  There were no vitals filed for this visit.  Wt Readings from Last 3 Encounters:  07/23/22 181 lb 3.2 oz (82.2 kg)  07/16/22 182 lb 9.6 oz (82.8 kg)  07/09/22 184 lb 8 oz (83.7 kg)    Physical Exam Vitals reviewed.  Constitutional:      Appearance: Normal appearance. She is obese.  Cardiovascular:     Rate and Rhythm: Normal rate and regular rhythm.     Pulses: Normal pulses.     Heart sounds: Normal heart sounds.  Pulmonary:     Effort: Pulmonary effort is normal.     Breath sounds: Normal breath sounds.  Neurological:     General: No focal deficit present.     Mental Status: She is alert and oriented to person, place, and time.  Psychiatric:        Mood and Affect: Mood normal.        Behavior: Behavior normal.     LABORATORY DATA:  I have reviewed the labs as listed.     Latest Ref Rng & Units 07/23/2022    9:17 AM 07/16/2022    9:51 AM 07/09/2022    9:22 AM  CBC  WBC 4.0 - 10.5 K/uL 3.8  4.2  3.1   Hemoglobin 12.0 - 15.0 g/dL 10.0  10.0  9.8   Hematocrit 36.0 - 46.0 % 31.2  31.0  30.4   Platelets 150 - 400 K/uL 103  82  71       Latest Ref Rng & Units 07/09/2022    9:22 AM 07/02/2022    8:37 AM 06/25/2022   11:14 AM  CMP  Glucose 70 - 99 mg/dL 155  209  213   BUN 8 - 23 mg/dL 37  22  29   Creatinine 0.44 - 1.00 mg/dL 1.52  1.51  1.54   Sodium 135 - 145 mmol/L 137  138  136   Potassium 3.5 - 5.1 mmol/L 4.2  4.0  3.7   Chloride 98 - 111 mmol/L 106  105  103   CO2 22 - 32 mmol/L _0 Calcium 8.9 - 10.3 mg/dL 8.1  8.4  8.6   Total Protein 6.5 - 8.1 g/dL 6.6  6.4  6.7   Total Bilirubin 0.3 - 1.2 mg/dL 0.6  0.4  0.8   Alkaline Phos 38 - 126 U/L 77  92  94   AST 15 - 41 U/L _1 ALT 0 - 44 U/L 38  38  31     DIAGNOSTIC IMAGING:  I have independently reviewed the scans and discussed with the patient. No results found.   ASSESSMENT:  1.  IgG lambda plasma cell myeloma, stage II, standard risk: -4 cycles of KPD from 04/17/2018 through 08/21/2018, stem cell transplant on 10/23/2018. -PET scan on 01/21/2019 showed multiple bone lesions but without any hypermetabolic activity. -BMBX on 01/21/2019 with normocellular marrow with no  increase in plasma cells.  Normal FISH.  MRD results negative. -Maintenance pomalidomide 2 mg 3 weeks on/1 week off started on 03/10/2019. -Bone marrow biopsy on 10/29/2019 shows trilineage hematopoiesis with no evidence of plasma cells.  Chromosome analysis and FISH are normal. -BM BX on 10/27/2020 at [facility] with 30 to 40%, TLH.  2% of the total cells are CD 138+ plasma cells. - Pomalyst was held due to dizziness from 03/29/2021 through 04/26/2021. - MRI of the brain on 03/31/2021 did not show any evidence of intracranial lesions.  Multiple bone lesions in the skull compatible with sequela of myeloma.  - Labs at [facility] on 11/16/2021: M spike 0.24 g.  Immunofixation positive for IgG lambda. - PET scan on 12/14/2021: No evidence of FDG avid osseous or soft tissue myeloma. - She has developed left shoulder blade and right posterior rib pain for the last 3 to 4 months. - 24-hour urine on 12/14/2021: Total protein 136 mg.  Immune immunofixation positive for lambda type Bence-Jones protein. -- PET scan (05/24/2022): Single  new hypermetabolic lytic lesion involving left lamina of T11.  No canal encroachment.  No other hypermetabolic bone lesions.  Stable diffuse lytic lesions throughout the axial and appendicular skeleton.  No soft tissue lesions. - Maintenance Pomalyst discontinued on 05/29/2022 - XRT to the T11 completed on 06/20/2022 - Daratumumab, Velcade and dexamethasone started on 06/11/2022  2.  Pulmonary embolism: -CT angiogram on 04/21/2020 showed filling defect at [facility]. -VQ scan confirmed pulmonary embolism. -She is on Eliquis.   3.  Right shoulder and upper arm pain: - She received XRT to the chest from 12/26/2020 through 01/06/2021.   PLAN:  1.  IgG lambda plasma cell myeloma: - Completed XRT to the T11 lamina on 06/20/2022. - She is tolerating Darzalex and Velcade very well.  She is taking dexamethasone 20 mg once weekly on Mondays. - Reviewed myeloma labs  from 07/02/2022 which showed M spike improved to 0.2 g from 0.4 g.  Lambda light chain also improved to 52 with ratio of 0.11. - Proceed with cycle 3 today without any dose changes.  Will check SPEP and free light chains in 2 weeks and see her back in 3 weeks prior to start of cycle 4.   2.  Back pain/left shoulder blade pain/right lateral and posterior rib pains: - Continue hydrocodone 10 mg every 6 hours which is helping.   3.  Myeloma bone disease: - Denosumab held due to exposure of bone in the right lower jaw.  Continue calcium and vitamin D supplements.   4.  Anxiety: - Continue Xanax twice daily as needed.   5.  Sleeping difficulty: - Continue Ambien at bedtime as needed.   6.  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  Hemoglobin is 10.1.  Will check ferritin and iron panel at next visit.   7.  Pulmonary embolism: - Continue Eliquis.  No bleeding issues.   Orders placed this encounter:  No orders of the defined types were placed in this encounter.      [name], MD [place] [phone]

## 2022-07-23 NOTE — Patient Instructions (Signed)
[place]  Discharge Instructions: Thank you for choosing [facility] to provide your oncology and hematology care.  If you have a lab appointment with the [facility], please come in thru the Main Entrance and check in at the main information desk.  Wear comfortable clothing and clothing appropriate for easy access to any Portacath or PICC line.   We strive to give you quality time with your provider. You may need to reschedule your appointment if you arrive late (15 or more minutes).  Arriving late affects you and other patients whose appointments are after yours.  Also, if you miss three or more appointments without notifying the office, you may be dismissed from the clinic at the provider's discretion.      For prescription refill requests, have your pharmacy contact our office and allow 72 hours for refills to be completed.    Today you received the following chemotherapy and/or immunotherapy agents Darzalex Faspro and Velcade injection. Bortezomib Injection What is this medication? BORTEZOMIB (bor TEZ oh mib) treats lymphoma. It may also be used to treat multiple myeloma, a type of bone marrow cancer. It works by blocking a protein that causes cancer cells to grow and multiply. This helps to slow or stop the spread of cancer cells. This medicine may be used for other purposes; ask your health care provider or pharmacist if you have questions. COMMON BRAND NAME(S): Velcade What should I tell my care team before I take this medication? They need to know if you have any of these conditions: Dehydration Diabetes Heart disease Liver disease Tingling of the fingers or toes or other nerve disorder An unusual or allergic reaction to bortezomib, other medications, foods, dyes, or preservatives If you or your partner are pregnant or trying to get pregnant Breastfeeding How should I use this medication? This medication is injected into a vein or under the  skin. It is given by your care team in a hospital or clinic setting. Talk to your care team about the use of this medication in children. Special care may be needed. Overdosage: If you think you have taken too much of this medicine contact a poison control center or emergency room at once. NOTE: This medicine is only for you. Do not share this medicine with others. What if I miss a dose? Keep appointments for follow-up doses. It is important not to miss your dose. Call your care team if you are unable to keep an appointment. What may interact with this medication? Ketoconazole Rifampin This list may not describe all possible interactions. Give your health care provider a list of all the medicines, herbs, non-prescription drugs, or dietary supplements you use. Also tell them if you smoke, drink alcohol, or use illegal drugs. Some items may interact with your medicine. What should I watch for while using this medication? Your condition will be monitored carefully while you are receiving this medication. You may need blood work while taking this medication. This medication may affect your coordination, reaction time, or judgment. Do not drive or operate machinery until you know how this medication affects you. Sit up or stand slowly to reduce the risk of dizzy or fainting spells. Drinking alcohol with this medication can increase the risk of these side effects. This medication may increase your risk of getting an infection. Call your care team for advice if you get a fever, chills, sore throat, or other symptoms of a cold or flu. Do not treat yourself. Try to  avoid being around people who are sick. Check with your care team if you have severe diarrhea, nausea, and vomiting, or if you sweat a lot. The loss of too much body fluid may make it dangerous for you to take this medication. Talk to your care team if you may be pregnant. Serious birth defects can occur if you take this medication during pregnancy  and for 7 months after the last dose. You will need a negative pregnancy test before starting this medication. Contraception is recommended while taking this medication and for 7 months after the last dose. Your care team can help you find the option that works for you. If your partner can get pregnant, use a condom during sex while taking this medication and for 4 months after the last dose. Do not breastfeed while taking this medication and for 2 months after the last dose. This medication may cause infertility. Talk to your care team if you are concerned about your fertility. What side effects may I notice from receiving this medication? Side effects that you should report to your care team as soon as possible: Allergic reactions--skin rash, itching, hives, swelling of the face, lips, tongue, or throat Bleeding--bloody or black, tar-like stools, vomiting blood or brown material that looks like coffee grounds, red or dark brown urine, small red or purple spots on skin, unusual bruising or bleeding Bleeding in the brain--severe headache, stiff neck, confusion, dizziness, change in vision, numbness or weakness of the face, arm, or leg, trouble speaking, trouble walking, vomiting Bowel blockage--stomach cramping, unable to have a bowel movement or pass gas, loss of appetite, vomiting Heart failure--shortness of breath, swelling of the ankles, feet, or hands, sudden weight gain, unusual weakness or fatigue Infection--fever, chills, cough, sore throat, wounds that don't heal, pain or trouble when passing urine, general feeling of discomfort or being unwell Liver injury--right upper belly pain, loss of appetite, nausea, light-colored stool, dark yellow or brown urine, yellowing skin or eyes, unusual weakness or fatigue Low blood pressure--dizziness, feeling faint or lightheaded, blurry vision Lung injury--shortness of breath or trouble breathing, cough, spitting up blood, chest pain, fever Pain, tingling,  or numbness in the hands or feet Severe or prolonged diarrhea Stomach pain, bloody diarrhea, pale skin, unusual weakness or fatigue, decrease in the amount of urine, which may be signs of hemolytic uremic syndrome Sudden and severe headache, confusion, change in vision, seizures, which may be signs of posterior reversible encephalopathy syndrome (PRES) TTP--purple spots on the skin or inside the mouth, pale skin, yellowing skin or eyes, unusual weakness or fatigue, fever, fast or irregular heartbeat, confusion, change in vision, trouble speaking, trouble walking Tumor lysis syndrome (TLS)--nausea, vomiting, diarrhea, decrease in the amount of urine, dark urine, unusual weakness or fatigue, confusion, muscle pain or cramps, fast or irregular heartbeat, joint pain Side effects that usually do not require medical attention (report to your care team if they continue or are bothersome): Constipation Diarrhea Fatigue Loss of appetite Nausea This list may not describe all possible side effects. Call your doctor for medical advice about side effects. You may report side effects to FDA at 1-800-FDA-1088. Where should I keep my medication? This medication is given in a hospital or clinic. It will not be stored at home. NOTE: This sheet is a summary. It may not cover all possible information. If you have questions about this medicine, talk to your doctor, pharmacist, or health care provider.  2023 Elsevier/Gold Standard (2022-01-03 00:00:00) Daratumumab; Hyaluronidase Injection What is this  medication? DARATUMUMAB; HYALURONIDASE (dar a toom ue mab; hye al ur ON i dase) treats multiple myeloma, a type of bone marrow cancer. Daratumumab works by blocking a protein that causes cancer cells to grow and multiply. This helps to slow or stop the spread of cancer cells. Hyaluronidase works by increasing the absorption of other medications in the body to help them work better. This medication may also be used treat  amyloidosis, a condition that causes the buildup of a protein (amyloid) in your body. It works by reducing the buildup of this protein, which decreases symptoms. It is a combination medication that contains a monoclonal antibody. This medicine may be used for other purposes; ask your health care provider or pharmacist if you have questions. COMMON BRAND NAME(S): DARZALEX FASPRO What should I tell my care team before I take this medication? They need to know if you have any of these conditions: Heart disease Infection, such as chickenpox, cold sores, herpes, hepatitis B Lung or breathing disease An unusual or allergic reaction to daratumumab, hyaluronidase, other medications, foods, dyes, or preservatives Pregnant or trying to get pregnant Breast-feeding How should I use this medication? This medication is injected under the skin. It is given by your care team in a hospital or clinic setting. Talk to your care team about the use of this medication in children. Special care may be needed. Overdosage: If you think you have taken too much of this medicine contact a poison control center or emergency room at once. NOTE: This medicine is only for you. Do not share this medicine with others. What if I miss a dose? Keep appointments for follow-up doses. It is important not to miss your dose. Call your care team if you are unable to keep an appointment. What may interact with this medication? Interactions have not been studied. This list may not describe all possible interactions. Give your health care provider a list of all the medicines, herbs, non-prescription drugs, or dietary supplements you use. Also tell them if you smoke, drink alcohol, or use illegal drugs. Some items may interact with your medicine. What should I watch for while using this medication? Your condition will be monitored carefully while you are receiving this medication. This medication can cause serious allergic reactions. To  reduce your risk, your care team may give you other medication to take before receiving this one. Be sure to follow the directions from your care team. This medication can affect the results of blood tests to match your blood type. These changes can last for up to 6 months after the final dose. Your care team will do blood tests to match your blood type before you start treatment. Tell all of your care team that you are being treated with this medication before receiving a blood transfusion. This medication can affect the results of some tests used to determine treatment response; extra tests may be needed to evaluate response. Talk to your care team if you wish to become pregnant or think you are pregnant. This medication can cause serious birth defects if taken during pregnancy and for 3 months after the last dose. A reliable form of contraception is recommended while taking this medication and for 3 months after the last dose. Talk to your care team about effective forms of contraception. Do not breast-feed while taking this medication. What side effects may I notice from receiving this medication? Side effects that you should report to your care team as soon as possible: Allergic reactions--skin rash, itching,  hives, swelling of the face, lips, tongue, or throat Heart rhythm changes--fast or irregular heartbeat, dizziness, feeling faint or lightheaded, chest pain, trouble breathing Infection--fever, chills, cough, sore throat, wounds that don't heal, pain or trouble when passing urine, general feeling of discomfort or being unwell Infusion reactions--chest pain, shortness of breath or trouble breathing, feeling faint or lightheaded Sudden eye pain or change in vision such as blurry vision, seeing halos around lights, vision loss Unusual bruising or bleeding Side effects that usually do not require medical attention (report to your care team if they continue or are  bothersome): Constipation Diarrhea Fatigue Nausea Pain, tingling, or numbness in the hands or feet Swelling of the ankles, hands, or feet This list may not describe all possible side effects. Call your doctor for medical advice about side effects. You may report side effects to FDA at 1-800-FDA-1088. Where should I keep my medication? This medication is given in a hospital or clinic. It will not be stored at home. NOTE: This sheet is a summary. It may not cover all possible information. If you have questions about this medicine, talk to your doctor, pharmacist, or health care provider.  2023 Elsevier/Gold Standard (2021-11-29 00:00:00)       To help prevent nausea and vomiting after your treatment, we encourage you to take your nausea medication as directed.  BELOW ARE SYMPTOMS THAT SHOULD BE REPORTED IMMEDIATELY: *FEVER GREATER THAN 100.4 F (38 C) OR HIGHER *CHILLS OR SWEATING *NAUSEA AND VOMITING THAT IS NOT CONTROLLED WITH YOUR NAUSEA MEDICATION *UNUSUAL SHORTNESS OF BREATH *UNUSUAL BRUISING OR BLEEDING *URINARY PROBLEMS (pain or burning when urinating, or frequent urination) *BOWEL PROBLEMS (unusual diarrhea, constipation, pain near the anus) TENDERNESS IN MOUTH AND THROAT WITH OR WITHOUT PRESENCE OF ULCERS (sore throat, sores in mouth, or a toothache) UNUSUAL RASH, SWELLING OR PAIN  UNUSUAL VAGINAL DISCHARGE OR ITCHING   Items with * indicate a potential emergency and should be followed up as soon as possible or go to the Emergency Department if any problems should occur.  Please show the CHEMOTHERAPY ALERT CARD or IMMUNOTHERAPY ALERT CARD at check-in to the Emergency Department and triage nurse.  Should you have questions after your visit or need to cancel or reschedule your appointment, please contact [facility] [phone]  and follow the prompts.  Office hours are 8:00 a.m. to 4:30 p.m. Monday - Friday. Please note that voicemails left after 4:00  p.m. may not be returned until the following business day.  We are closed weekends and major holidays. You have access to a nurse at all times for urgent questions. Please call the main number to the clinic [phone] and follow the prompts.  For any non-urgent questions, you may also contact your provider using MyChart. We now offer e-Visits for anyone 55 and older to request care online for non-urgent symptoms. For details visit [email or web address].   Also download the MyChart app! Go to the app store, search "MyChart", open the app, select , and log in with your MyChart username and password.  Masks are optional in the cancer centers. If you would like for your care team to wear a mask while they are taking care of you, please let them know. You may have one support person who is at least 74 years old accompany you for your appointments.

## 2022-07-24 ENCOUNTER — Other Ambulatory Visit: Payer: Self-pay

## 2022-07-24 ENCOUNTER — Other Ambulatory Visit: Payer: Self-pay | Admitting: *Deleted

## 2022-07-24 DIAGNOSIS — C9000 Multiple myeloma not having achieved remission: Secondary | ICD-10-CM

## 2022-07-24 DIAGNOSIS — D508 Other iron deficiency anemias: Secondary | ICD-10-CM

## 2022-07-25 MED ORDER — HYDROCODONE-ACETAMINOPHEN 10-325 MG PO TABS: 1.0000 | ORAL_TABLET | Freq: Four times a day (QID) | ORAL | 0 refills | Status: DC | PRN

## 2022-07-26 ENCOUNTER — Inpatient Hospital Stay: Payer: Medicare Other | Admitting: Hematology

## 2022-07-26 ENCOUNTER — Inpatient Hospital Stay: Payer: Medicare Other

## 2022-07-26 VITALS — BP 150/80 | HR 73 | Temp 97.5°F | Resp 18 | Wt 182.6 lb

## 2022-07-26 DIAGNOSIS — C9000 Multiple myeloma not having achieved remission: Secondary | ICD-10-CM

## 2022-07-26 DIAGNOSIS — Z5112 Encounter for antineoplastic immunotherapy: Secondary | ICD-10-CM | POA: Diagnosis not present

## 2022-07-26 MED ORDER — BORTEZOMIB CHEMO SQ INJECTION 3.5 MG (2.5MG/ML)
1.3000 mg/m2 | Freq: Once | INTRAMUSCULAR | Status: AC
  Administered 2022-07-26: 2.5 mg via SUBCUTANEOUS
  Filled 2022-07-26: qty 1

## 2022-07-26 NOTE — Patient Instructions (Signed)
[facility]  Discharge Instructions: Thank you for choosing [facility] to provide your oncology and hematology care.  If you have a lab appointment with the [facility], please come in thru the Main Entrance and check in at the main information desk.  Wear comfortable clothing and clothing appropriate for easy access to any Portacath or PICC line.   We strive to give you quality time with your provider. You may need to reschedule your appointment if you arrive late (15 or more minutes).  Arriving late affects you and other patients whose appointments are after yours.  Also, if you miss three or more appointments without notifying the office, you may be dismissed from the clinic at the provider's discretion.      For prescription refill requests, have your pharmacy contact our office and allow 72 hours for refills to be completed.    Today you received the following chemotherapy and/or immunotherapy agents Velcade injection. Bortezomib Injection What is this medication? BORTEZOMIB (bor TEZ oh mib) treats lymphoma. It may also be used to treat multiple myeloma, a type of bone marrow cancer. It works by blocking a protein that causes cancer cells to grow and multiply. This helps to slow or stop the spread of cancer cells. This medicine may be used for other purposes; ask your health care provider or pharmacist if you have questions. COMMON BRAND NAME(S): Velcade What should I tell my care team before I take this medication? They need to know if you have any of these conditions: Dehydration Diabetes Heart disease Liver disease Tingling of the fingers or toes or other nerve disorder An unusual or allergic reaction to bortezomib, other medications, foods, dyes, or preservatives If you or your partner are pregnant or trying to get pregnant Breastfeeding How should I use this medication? This medication is injected into a vein or under the skin. It is given  by your care team in a hospital or clinic setting. Talk to your care team about the use of this medication in children. Special care may be needed. Overdosage: If you think you have taken too much of this medicine contact a poison control center or emergency room at once. NOTE: This medicine is only for you. Do not share this medicine with others. What if I miss a dose? Keep appointments for follow-up doses. It is important not to miss your dose. Call your care team if you are unable to keep an appointment. What may interact with this medication? Ketoconazole Rifampin This list may not describe all possible interactions. Give your health care provider a list of all the medicines, herbs, non-prescription drugs, or dietary supplements you use. Also tell them if you smoke, drink alcohol, or use illegal drugs. Some items may interact with your medicine. What should I watch for while using this medication? Your condition will be monitored carefully while you are receiving this medication. You may need blood work while taking this medication. This medication may affect your coordination, reaction time, or judgment. Do not drive or operate machinery until you know how this medication affects you. Sit up or stand slowly to reduce the risk of dizzy or fainting spells. Drinking alcohol with this medication can increase the risk of these side effects. This medication may increase your risk of getting an infection. Call your care team for advice if you get a fever, chills, sore throat, or other symptoms of a cold or flu. Do not treat yourself. Try to avoid being around  people who are sick. Check with your care team if you have severe diarrhea, nausea, and vomiting, or if you sweat a lot. The loss of too much body fluid may make it dangerous for you to take this medication. Talk to your care team if you may be pregnant. Serious birth defects can occur if you take this medication during pregnancy and for 7 months  after the last dose. You will need a negative pregnancy test before starting this medication. Contraception is recommended while taking this medication and for 7 months after the last dose. Your care team can help you find the option that works for you. If your partner can get pregnant, use a condom during sex while taking this medication and for 4 months after the last dose. Do not breastfeed while taking this medication and for 2 months after the last dose. This medication may cause infertility. Talk to your care team if you are concerned about your fertility. What side effects may I notice from receiving this medication? Side effects that you should report to your care team as soon as possible: Allergic reactions--skin rash, itching, hives, swelling of the face, lips, tongue, or throat Bleeding--bloody or black, tar-like stools, vomiting blood or brown material that looks like coffee grounds, red or dark brown urine, small red or purple spots on skin, unusual bruising or bleeding Bleeding in the brain--severe headache, stiff neck, confusion, dizziness, change in vision, numbness or weakness of the face, arm, or leg, trouble speaking, trouble walking, vomiting Bowel blockage--stomach cramping, unable to have a bowel movement or pass gas, loss of appetite, vomiting Heart failure--shortness of breath, swelling of the ankles, feet, or hands, sudden weight gain, unusual weakness or fatigue Infection--fever, chills, cough, sore throat, wounds that don't heal, pain or trouble when passing urine, general feeling of discomfort or being unwell Liver injury--right upper belly pain, loss of appetite, nausea, light-colored stool, dark yellow or brown urine, yellowing skin or eyes, unusual weakness or fatigue Low blood pressure--dizziness, feeling faint or lightheaded, blurry vision Lung injury--shortness of breath or trouble breathing, cough, spitting up blood, chest pain, fever Pain, tingling, or numbness in  the hands or feet Severe or prolonged diarrhea Stomach pain, bloody diarrhea, pale skin, unusual weakness or fatigue, decrease in the amount of urine, which may be signs of hemolytic uremic syndrome Sudden and severe headache, confusion, change in vision, seizures, which may be signs of posterior reversible encephalopathy syndrome (PRES) TTP--purple spots on the skin or inside the mouth, pale skin, yellowing skin or eyes, unusual weakness or fatigue, fever, fast or irregular heartbeat, confusion, change in vision, trouble speaking, trouble walking Tumor lysis syndrome (TLS)--nausea, vomiting, diarrhea, decrease in the amount of urine, dark urine, unusual weakness or fatigue, confusion, muscle pain or cramps, fast or irregular heartbeat, joint pain Side effects that usually do not require medical attention (report to your care team if they continue or are bothersome): Constipation Diarrhea Fatigue Loss of appetite Nausea This list may not describe all possible side effects. Call your doctor for medical advice about side effects. You may report side effects to FDA at 1-800-FDA-1088. Where should I keep my medication? This medication is given in a hospital or clinic. It will not be stored at home. NOTE: This sheet is a summary. It may not cover all possible information. If you have questions about this medicine, talk to your doctor, pharmacist, or health care provider.  2023 Elsevier/Gold Standard (2022-01-03 00:00:00)       To help prevent  nausea and vomiting after your treatment, we encourage you to take your nausea medication as directed.  BELOW ARE SYMPTOMS THAT SHOULD BE REPORTED IMMEDIATELY: *FEVER GREATER THAN 100.4 F (38 C) OR HIGHER *CHILLS OR SWEATING *NAUSEA AND VOMITING THAT IS NOT CONTROLLED WITH YOUR NAUSEA MEDICATION *UNUSUAL SHORTNESS OF BREATH *UNUSUAL BRUISING OR BLEEDING *URINARY PROBLEMS (pain or burning when urinating, or frequent urination) *BOWEL PROBLEMS (unusual  diarrhea, constipation, pain near the anus) TENDERNESS IN MOUTH AND THROAT WITH OR WITHOUT PRESENCE OF ULCERS (sore throat, sores in mouth, or a toothache) UNUSUAL RASH, SWELLING OR PAIN  UNUSUAL VAGINAL DISCHARGE OR ITCHING   Items with * indicate a potential emergency and should be followed up as soon as possible or go to the Emergency Department if any problems should occur.  Please show the CHEMOTHERAPY ALERT CARD or IMMUNOTHERAPY ALERT CARD at check-in to the Emergency Department and triage nurse.  Should you have questions after your visit or need to cancel or reschedule your appointment, please contact [facility] [phone]  and follow the prompts.  Office hours are 8:00 a.m. to 4:30 p.m. Monday - Friday. Please note that voicemails left after 4:00 p.m. may not be returned until the following business day.  We are closed weekends and major holidays. You have access to a nurse at all times for urgent questions. Please call the main number to the clinic [phone] and follow the prompts.  For any non-urgent questions, you may also contact your provider using MyChart. We now offer e-Visits for anyone 23 and older to request care online for non-urgent symptoms. For details visit [email or web address].   Also download the MyChart app! Go to the app store, search "MyChart", open the app, select [facility], and log in with your MyChart username and password.  Masks are optional in the cancer centers. If you would like for your care team to wear a mask while they are taking care of you, please let them know. You may have one support person who is at least 74 years old accompany you for your appointments.

## 2022-07-26 NOTE — Progress Notes (Signed)
Velcade injection given today per MD orders. Tolerated without adverse affects. Vital signs stable. No complaints at this time. Discharged from clinic ambulatory in stable condition. Alert and oriented x 3. F/U with [facility] as scheduled.

## 2022-07-27 ENCOUNTER — Other Ambulatory Visit: Payer: Self-pay

## 2022-07-29 ENCOUNTER — Other Ambulatory Visit: Payer: Self-pay

## 2022-07-30 ENCOUNTER — Inpatient Hospital Stay: Payer: Medicare Other

## 2022-07-30 ENCOUNTER — Other Ambulatory Visit: Payer: Self-pay

## 2022-07-30 VITALS — BP 148/97 | HR 81 | Temp 97.5°F | Resp 20 | Wt 181.8 lb

## 2022-07-30 DIAGNOSIS — D508 Other iron deficiency anemias: Secondary | ICD-10-CM

## 2022-07-30 DIAGNOSIS — Z95828 Presence of other vascular implants and grafts: Secondary | ICD-10-CM

## 2022-07-30 DIAGNOSIS — Z5112 Encounter for antineoplastic immunotherapy: Secondary | ICD-10-CM | POA: Diagnosis not present

## 2022-07-30 DIAGNOSIS — C9000 Multiple myeloma not having achieved remission: Secondary | ICD-10-CM

## 2022-07-30 LAB — COMPREHENSIVE METABOLIC PANEL
ALT: 24 U/L (ref 0–44)
AST: 23 U/L (ref 15–41)
Albumin: 3.5 g/dL (ref 3.5–5.0)
Alkaline Phosphatase: 76 U/L (ref 38–126)
Anion gap: 9 (ref 5–15)
BUN: 29 mg/dL — ABNORMAL HIGH (ref 8–23)
CO2: 27 mmol/L (ref 22–32)
Calcium: 8.3 mg/dL — ABNORMAL LOW (ref 8.9–10.3)
Chloride: 103 mmol/L (ref 98–111)
Creatinine, Ser: 1.44 mg/dL — ABNORMAL HIGH (ref 0.44–1.00)
GFR, Estimated: 38 mL/min — ABNORMAL LOW (ref >60)
Glucose, Bld: 200 mg/dL — ABNORMAL HIGH (ref 70–99)
Potassium: 3.9 mmol/L (ref 3.5–5.1)
Sodium: 139 mmol/L (ref 135–145)
Total Bilirubin: 0.5 mg/dL (ref 0.3–1.2)
Total Protein: 6.5 g/dL (ref 6.5–8.1)

## 2022-07-30 LAB — IRON AND TIBC
Iron: 58 ug/dL (ref 28–170)
Saturation Ratios: 21 % (ref 10.4–31.8)
TIBC: 278 ug/dL (ref 250–450)
UIBC: 220 ug/dL

## 2022-07-30 LAB — CBC WITH DIFFERENTIAL/PLATELET
Abs Immature Granulocytes: 0.02 10*3/uL (ref 0.00–0.07)
Basophils Absolute: 0 10*3/uL (ref 0.0–0.1)
Basophils Relative: 0 %
Eosinophils Absolute: 0.1 10*3/uL (ref 0.0–0.5)
Eosinophils Relative: 1 %
HCT: 30.3 % — ABNORMAL LOW (ref 36.0–46.0)
Hemoglobin: 9.8 g/dL — ABNORMAL LOW (ref 12.0–15.0)
Immature Granulocytes: 1 %
Lymphocytes Relative: 10 %
Lymphs Abs: 0.4 10*3/uL — ABNORMAL LOW (ref 0.7–4.0)
MCH: 33.4 pg (ref 26.0–34.0)
MCHC: 32.3 g/dL (ref 30.0–36.0)
MCV: 103.4 fL — ABNORMAL HIGH (ref 80.0–100.0)
Monocytes Absolute: 0.3 10*3/uL (ref 0.1–1.0)
Monocytes Relative: 6 %
Neutro Abs: 3.3 10*3/uL (ref 1.7–7.7)
Neutrophils Relative %: 82 %
Platelets: 72 10*3/uL — ABNORMAL LOW (ref 150–400)
RBC: 2.93 MIL/uL — ABNORMAL LOW (ref 3.87–5.11)
RDW: 13.4 % (ref 11.5–15.5)
WBC: 4 10*3/uL (ref 4.0–10.5)
nRBC: 0 % (ref 0.0–0.2)

## 2022-07-30 LAB — FERRITIN: Ferritin: 313 ng/mL — ABNORMAL HIGH (ref 11–307)

## 2022-07-30 LAB — MAGNESIUM: Magnesium: 1.7 mg/dL (ref 1.7–2.4)

## 2022-07-30 MED ORDER — SODIUM CHLORIDE 0.9% FLUSH
10.0000 mL | Freq: Once | INTRAVENOUS | Status: AC
  Administered 2022-07-30: 10 mL via INTRAVENOUS

## 2022-07-30 MED ORDER — DARATUMUMAB-HYALURONIDASE-FIHJ 1800-30000 MG-UT/15ML ~~LOC~~ SOLN
1800.0000 mg | Freq: Once | SUBCUTANEOUS | Status: AC
  Administered 2022-07-30: 1800 mg via SUBCUTANEOUS
  Filled 2022-07-30: qty 15

## 2022-07-30 MED ORDER — HEPARIN SOD (PORK) LOCK FLUSH 100 UNIT/ML IV SOLN
500.0000 [IU] | Freq: Once | INTRAVENOUS | Status: AC
  Administered 2022-07-30: 500 [IU] via INTRAVENOUS

## 2022-07-30 MED ORDER — BORTEZOMIB CHEMO SQ INJECTION 3.5 MG (2.5MG/ML)
1.3000 mg/m2 | Freq: Once | INTRAMUSCULAR | Status: AC
  Administered 2022-07-30: 2.5 mg via SUBCUTANEOUS
  Filled 2022-07-30: qty 1

## 2022-07-30 NOTE — Patient Instructions (Signed)
[facility]  Discharge Instructions: Thank you for choosing [facility] to provide your oncology and hematology care.  If you have a lab appointment with the [facility], please come in thru the Main Entrance and check in at the main information desk.  Wear comfortable clothing and clothing appropriate for easy access to any Portacath or PICC line.   We strive to give you quality time with your provider. You may need to reschedule your appointment if you arrive late (15 or more minutes).  Arriving late affects you and other patients whose appointments are after yours.  Also, if you miss three or more appointments without notifying the office, you may be dismissed from the clinic at the provider's discretion.      For prescription refill requests, have your pharmacy contact our office and allow 72 hours for refills to be completed.    Today you received the following chemotherapy and/or immunotherapy agents Dartumumab /Velcade.  Daratumumab Injection What is this medication? DARATUMUMAB (dar a toom ue mab) treats multiple myeloma, a type of bone marrow cancer. It works by helping your immune system slow or stop the spread of cancer cells. It is a monoclonal antibody. This medicine may be used for other purposes; ask your health care provider or pharmacist if you have questions. COMMON BRAND NAME(S): DARZALEX What should I tell my care team before I take this medication? They need to know if you have any of these conditions: Hereditary fructose intolerance Infection, such as chickenpox, herpes, hepatitis B virus Lung or breathing disease, such as asthma, COPD An unusual or allergic reaction to daratumumab, sorbitol, other medications, foods, dyes, or preservatives Pregnant or trying to get pregnant Breast-feeding How should I use this medication? This medication is injected into a vein. It is given by your care team in a hospital or clinic setting. Talk  to your care team about the use of this medication in children. Special care may be needed. Overdosage: If you think you have taken too much of this medicine contact a poison control center or emergency room at once. NOTE: This medicine is only for you. Do not share this medicine with others. What if I miss a dose? Keep appointments for follow-up doses. It is important not to miss your dose. Call your care team if you are unable to keep an appointment. What may interact with this medication? Interactions have not been studied. This list may not describe all possible interactions. Give your health care provider a list of all the medicines, herbs, non-prescription drugs, or dietary supplements you use. Also tell them if you smoke, drink alcohol, or use illegal drugs. Some items may interact with your medicine. What should I watch for while using this medication? Your condition will be monitored carefully while you are receiving this medication. This medication can cause serious allergic reactions. To reduce your risk, your care team may give you other medication to take before receiving this one. Be sure to follow the directions from your care team. This medication can affect the results of blood tests to match your blood type. These changes can last for up to 6 months after the final dose. Your care team will do blood tests to match your blood type before you start treatment. Tell all of your care team that you are being treated with this medication before receiving a blood transfusion. This medication can affect the results of some tests used to determine treatment response; extra tests may be  needed to evaluate response. Talk to your care team if you wish to become pregnant or think you are pregnant. This medication can cause serious birth defects if taken during pregnancy and for 3 months after the last dose. A reliable form of contraception is recommended while taking this medication and for 3 months  after the last dose. Talk to your care team about effective forms of contraception. Do not breast-feed while taking this medication. What side effects may I notice from receiving this medication? Side effects that you should report to your care team as soon as possible: Allergic reactions--skin rash, itching, hives, swelling of the face, lips, tongue, or throat Infection--fever, chills, cough, sore throat, wounds that don't heal, pain or trouble when passing urine, general feeling of discomfort or being unwell Infusion reactions--chest pain, shortness of breath or trouble breathing, feeling faint or lightheaded Unusual bruising or bleeding Side effects that usually do not require medical attention (report to your care team if they continue or are bothersome): Constipation Diarrhea Fatigue Nausea Pain, tingling, or numbness in the hands or feet Swelling of the ankles, hands, or feet This list may not describe all possible side effects. Call your doctor for medical advice about side effects. You may report side effects to FDA at 1-800-FDA-1088. Where should I keep my medication? This medication is given in a hospital or clinic. It will not be stored at home. NOTE: This sheet is a summary. It may not cover all possible information. If you have questions about this medicine, talk to your doctor, pharmacist, or health care provider.  2023 Elsevier/Gold Standard (2021-11-29 00:00:00)    Bortezomib Injection What is this medication? BORTEZOMIB (bor TEZ oh mib) treats lymphoma. It may also be used to treat multiple myeloma, a type of bone marrow cancer. It works by blocking a protein that causes cancer cells to grow and multiply. This helps to slow or stop the spread of cancer cells. This medicine may be used for other purposes; ask your health care provider or pharmacist if you have questions. COMMON BRAND NAME(S): Velcade What should I tell my care team before I take this medication? They  need to know if you have any of these conditions: Dehydration Diabetes Heart disease Liver disease Tingling of the fingers or toes or other nerve disorder An unusual or allergic reaction to bortezomib, other medications, foods, dyes, or preservatives If you or your partner are pregnant or trying to get pregnant Breastfeeding How should I use this medication? This medication is injected into a vein or under the skin. It is given by your care team in a hospital or clinic setting. Talk to your care team about the use of this medication in children. Special care may be needed. Overdosage: If you think you have taken too much of this medicine contact a poison control center or emergency room at once. NOTE: This medicine is only for you. Do not share this medicine with others. What if I miss a dose? Keep appointments for follow-up doses. It is important not to miss your dose. Call your care team if you are unable to keep an appointment. What may interact with this medication? Ketoconazole Rifampin This list may not describe all possible interactions. Give your health care provider a list of all the medicines, herbs, non-prescription drugs, or dietary supplements you use. Also tell them if you smoke, drink alcohol, or use illegal drugs. Some items may interact with your medicine. What should I watch for while using this medication? Your  condition will be monitored carefully while you are receiving this medication. You may need blood work while taking this medication. This medication may affect your coordination, reaction time, or judgment. Do not drive or operate machinery until you know how this medication affects you. Sit up or stand slowly to reduce the risk of dizzy or fainting spells. Drinking alcohol with this medication can increase the risk of these side effects. This medication may increase your risk of getting an infection. Call your care team for advice if you get a fever, chills, sore  throat, or other symptoms of a cold or flu. Do not treat yourself. Try to avoid being around people who are sick. Check with your care team if you have severe diarrhea, nausea, and vomiting, or if you sweat a lot. The loss of too much body fluid may make it dangerous for you to take this medication. Talk to your care team if you may be pregnant. Serious birth defects can occur if you take this medication during pregnancy and for 7 months after the last dose. You will need a negative pregnancy test before starting this medication. Contraception is recommended while taking this medication and for 7 months after the last dose. Your care team can help you find the option that works for you. If your partner can get pregnant, use a condom during sex while taking this medication and for 4 months after the last dose. Do not breastfeed while taking this medication and for 2 months after the last dose. This medication may cause infertility. Talk to your care team if you are concerned about your fertility. What side effects may I notice from receiving this medication? Side effects that you should report to your care team as soon as possible: Allergic reactions--skin rash, itching, hives, swelling of the face, lips, tongue, or throat Bleeding--bloody or black, tar-like stools, vomiting blood or brown material that looks like coffee grounds, red or dark brown urine, small red or purple spots on skin, unusual bruising or bleeding Bleeding in the brain--severe headache, stiff neck, confusion, dizziness, change in vision, numbness or weakness of the face, arm, or leg, trouble speaking, trouble walking, vomiting Bowel blockage--stomach cramping, unable to have a bowel movement or pass gas, loss of appetite, vomiting Heart failure--shortness of breath, swelling of the ankles, feet, or hands, sudden weight gain, unusual weakness or fatigue Infection--fever, chills, cough, sore throat, wounds that don't heal, pain or  trouble when passing urine, general feeling of discomfort or being unwell Liver injury--right upper belly pain, loss of appetite, nausea, light-colored stool, dark yellow or brown urine, yellowing skin or eyes, unusual weakness or fatigue Low blood pressure--dizziness, feeling faint or lightheaded, blurry vision Lung injury--shortness of breath or trouble breathing, cough, spitting up blood, chest pain, fever Pain, tingling, or numbness in the hands or feet Severe or prolonged diarrhea Stomach pain, bloody diarrhea, pale skin, unusual weakness or fatigue, decrease in the amount of urine, which may be signs of hemolytic uremic syndrome Sudden and severe headache, confusion, change in vision, seizures, which may be signs of posterior reversible encephalopathy syndrome (PRES) TTP--purple spots on the skin or inside the mouth, pale skin, yellowing skin or eyes, unusual weakness or fatigue, fever, fast or irregular heartbeat, confusion, change in vision, trouble speaking, trouble walking Tumor lysis syndrome (TLS)--nausea, vomiting, diarrhea, decrease in the amount of urine, dark urine, unusual weakness or fatigue, confusion, muscle pain or cramps, fast or irregular heartbeat, joint pain Side effects that usually do not require medical  attention (report to your care team if they continue or are bothersome): Constipation Diarrhea Fatigue Loss of appetite Nausea This list may not describe all possible side effects. Call your doctor for medical advice about side effects. You may report side effects to FDA at 1-800-FDA-1088. Where should I keep my medication? This medication is given in a hospital or clinic. It will not be stored at home. NOTE: This sheet is a summary. It may not cover all possible information. If you have questions about this medicine, talk to your doctor, pharmacist, or health care provider.  2023 Elsevier/Gold Standard (2022-01-03 00:00:00)      To help prevent nausea and vomiting  after your treatment, we encourage you to take your nausea medication as directed.  BELOW ARE SYMPTOMS THAT SHOULD BE REPORTED IMMEDIATELY: *FEVER GREATER THAN 100.4 F (38 C) OR HIGHER *CHILLS OR SWEATING *NAUSEA AND VOMITING THAT IS NOT CONTROLLED WITH YOUR NAUSEA MEDICATION *UNUSUAL SHORTNESS OF BREATH *UNUSUAL BRUISING OR BLEEDING *URINARY PROBLEMS (pain or burning when urinating, or frequent urination) *BOWEL PROBLEMS (unusual diarrhea, constipation, pain near the anus) TENDERNESS IN MOUTH AND THROAT WITH OR WITHOUT PRESENCE OF ULCERS (sore throat, sores in mouth, or a toothache) UNUSUAL RASH, SWELLING OR PAIN  UNUSUAL VAGINAL DISCHARGE OR ITCHING   Items with * indicate a potential emergency and should be followed up as soon as possible or go to the Emergency Department if any problems should occur.  Please show the CHEMOTHERAPY ALERT CARD or IMMUNOTHERAPY ALERT CARD at check-in to the Emergency Department and triage nurse.  Should you have questions after your visit or need to cancel or reschedule your appointment, please contact [facility] [phone]  and follow the prompts.  Office hours are 8:00 a.m. to 4:30 p.m. Monday - Friday. Please note that voicemails left after 4:00 p.m. may not be returned until the following business day.  We are closed weekends and major holidays. You have access to a nurse at all times for urgent questions. Please call the main number to the clinic [phone] and follow the prompts.  For any non-urgent questions, you may also contact your provider using MyChart. We now offer e-Visits for anyone 39 and older to request care online for non-urgent symptoms. For details visit [email or web address].   Also download the MyChart app! Go to the app store, search "MyChart", open the app, select [facility], and log in with your MyChart username and password.  Masks are optional in the cancer centers. If you would like for your care team  to wear a mask while they are taking care of you, please let them know. You may have one support person who is at least 74 years old accompany you for your appointments.

## 2022-07-30 NOTE — Progress Notes (Signed)
Patient presents today for Daratumumab and Velcade injections.  Patient is in satisfactory condition with no new complaints voiced.  Vital signs are stable.  Labs reviewed. Platelets today are 72.  Dr. Karle Starch Ribakove notified.  Tylenol, Benadryl, and Decadron were taken at 0730 at home prior to visit.  Ok to proceed with injections per MD orders.    Patient tolerated injections well with no complaints voiced.  Patient left ambulatory in stable condition.  Vital signs stable at discharge.  Follow up as scheduled.

## 2022-07-31 ENCOUNTER — Other Ambulatory Visit: Payer: Self-pay

## 2022-08-01 ENCOUNTER — Encounter (HOSPITAL_COMMUNITY): Payer: Self-pay

## 2022-08-01 ENCOUNTER — Other Ambulatory Visit: Payer: Self-pay

## 2022-08-01 ENCOUNTER — Emergency Department (HOSPITAL_COMMUNITY)
Admission: EM | Admit: 2022-08-01 | Discharge: 2022-08-02 | Disposition: A | Discharge status: Home / Self Care | Payer: Medicare Other | Attending: Emergency Medicine | Admitting: Emergency Medicine

## 2022-08-01 DIAGNOSIS — Z8579 Personal history of other malignant neoplasms of lymphoid, hematopoietic and related tissues: Secondary | ICD-10-CM | POA: Insufficient documentation

## 2022-08-01 DIAGNOSIS — Z7901 Long term (current) use of anticoagulants: Secondary | ICD-10-CM | POA: Diagnosis not present

## 2022-08-01 DIAGNOSIS — R109 Unspecified abdominal pain: Secondary | ICD-10-CM | POA: Insufficient documentation

## 2022-08-01 LAB — CBC
HCT: 33.6 % — ABNORMAL LOW (ref 36.0–46.0)
Hemoglobin: 10.8 g/dL — ABNORMAL LOW (ref 12.0–15.0)
MCH: 33.1 pg (ref 26.0–34.0)
MCHC: 32.1 g/dL (ref 30.0–36.0)
MCV: 103.1 fL — ABNORMAL HIGH (ref 80.0–100.0)
Platelets: 65 10*3/uL — ABNORMAL LOW (ref 150–400)
RBC: 3.26 MIL/uL — ABNORMAL LOW (ref 3.87–5.11)
RDW: 13.9 % (ref 11.5–15.5)
WBC: 5.8 10*3/uL (ref 4.0–10.5)
nRBC: 0 % (ref 0.0–0.2)

## 2022-08-01 LAB — BASIC METABOLIC PANEL
Anion gap: 9 (ref 5–15)
BUN: 30 mg/dL — ABNORMAL HIGH (ref 8–23)
CO2: 28 mmol/L (ref 22–32)
Calcium: 9.1 mg/dL (ref 8.9–10.3)
Chloride: 101 mmol/L (ref 98–111)
Creatinine, Ser: 1.67 mg/dL — ABNORMAL HIGH (ref 0.44–1.00)
GFR, Estimated: 32 mL/min — ABNORMAL LOW (ref >60)
Glucose, Bld: 311 mg/dL — ABNORMAL HIGH (ref 70–99)
Potassium: 4.1 mmol/L (ref 3.5–5.1)
Sodium: 138 mmol/L (ref 135–145)

## 2022-08-01 NOTE — ED Triage Notes (Signed)
POV from home. Cc of right flank pain. Has been going on since this am. States she has been frequently going to urinate. Says she has been nauseas as well.

## 2022-08-02 ENCOUNTER — Emergency Department (HOSPITAL_COMMUNITY): Payer: Medicare Other

## 2022-08-02 ENCOUNTER — Inpatient Hospital Stay: Payer: Medicare Other

## 2022-08-02 ENCOUNTER — Inpatient Hospital Stay: Payer: Medicare Other | Admitting: Hematology

## 2022-08-02 VITALS — BP 147/89 | HR 77 | Temp 96.9°F | Resp 20 | Wt 181.8 lb

## 2022-08-02 DIAGNOSIS — Z5112 Encounter for antineoplastic immunotherapy: Secondary | ICD-10-CM | POA: Diagnosis not present

## 2022-08-02 DIAGNOSIS — R109 Unspecified abdominal pain: Secondary | ICD-10-CM | POA: Diagnosis not present

## 2022-08-02 DIAGNOSIS — C9000 Multiple myeloma not having achieved remission: Secondary | ICD-10-CM

## 2022-08-02 LAB — URINALYSIS, ROUTINE W REFLEX MICROSCOPIC
Bilirubin Urine: NEGATIVE
Glucose, UA: >=500 mg/dL — AB
Hgb urine dipstick: NEGATIVE
Ketones, ur: NEGATIVE mg/dL
Leukocytes,Ua: NEGATIVE
Nitrite: NEGATIVE
Protein, ur: NEGATIVE mg/dL
Specific Gravity, Urine: 1.012 (ref 1.005–1.030)
pH: 5 (ref 5.0–8.0)

## 2022-08-02 LAB — HEPATIC FUNCTION PANEL
ALT: 34 U/L (ref 0–44)
AST: 28 U/L (ref 15–41)
Albumin: 4.1 g/dL (ref 3.5–5.0)
Alkaline Phosphatase: 83 U/L (ref 38–126)
Bilirubin, Direct: <0.1 mg/dL (ref 0.0–0.2)
Total Bilirubin: 0.3 mg/dL (ref 0.3–1.2)
Total Protein: 7.3 g/dL (ref 6.5–8.1)

## 2022-08-02 MED ORDER — HYDROMORPHONE HCL 1 MG/ML IJ SOLN
1.0000 mg | Freq: Once | INTRAMUSCULAR | Status: AC
  Administered 2022-08-02: 1 mg via INTRAVENOUS
  Filled 2022-08-02: qty 1

## 2022-08-02 MED ORDER — BORTEZOMIB CHEMO SQ INJECTION 3.5 MG (2.5MG/ML)
1.3000 mg/m2 | Freq: Once | INTRAMUSCULAR | Status: AC
  Administered 2022-08-02: 2.5 mg via SUBCUTANEOUS
  Filled 2022-08-02: qty 1

## 2022-08-02 MED ORDER — LACTATED RINGERS IV BOLUS
1000.0000 mL | Freq: Once | INTRAVENOUS | Status: AC
  Administered 2022-08-02: 1000 mL via INTRAVENOUS

## 2022-08-02 MED ORDER — METHOCARBAMOL 500 MG PO TABS
500.0000 mg | ORAL_TABLET | Freq: Once | ORAL | Status: AC
  Administered 2022-08-02: 500 mg via ORAL
  Filled 2022-08-02: qty 1

## 2022-08-02 MED ORDER — METHOCARBAMOL 500 MG PO TABS: 500.0000 mg | ORAL_TABLET | Freq: Three times a day (TID) | ORAL | 0 refills | Status: DC | PRN

## 2022-08-02 NOTE — ED Provider Notes (Signed)
[facility] EMERGENCY DEPARTMENT Provider Note   CSN: [id number] Arrival date & time: 08/01/22  2217     History  Chief Complaint  Patient presents with   Flank Pain    Alexandra Price is a 76 y.o. female.  76 yo F here with right flank pain. Has h/o MM and chronic pain issues related to that so has hydrocodone at home that she has tried for this without relief. Increased frequency of urination, but no other urinary symptoms. No h/o kidney stones. No falls or other trauma. No rashes. No n/v/d or constipation. No other associated symptoms.    Flank Pain       Home Medications Prior to Admission medications   Medication Sig Start Date End Date Taking? Authorizing Provider  methocarbamol (ROBAXIN) 500 MG tablet Take 1 tablet (500 mg total) by mouth every 8 (eight) hours as needed for muscle spasms (back pain). 08/02/22  Yes Kabrina Christiano, Corene Cornea, MD  acyclovir (ZOVIRAX) 400 MG tablet Take 1 tablet (400 mg total) by mouth 2 (two) times daily. 06/22/22   Harriett Rush, [name]  albuterol (VENTOLIN HFA) 108 (90 Base) MCG/ACT inhaler 1 puff as needed Inhalation every 4 hrs for 30 days 07/19/22   [provider]  ALPRAZolam (XANAX) 0.5 MG tablet TAKE 1 TABLET BY MOUTH DURING THE DAY AS NEEDED FOR ANXIETY AND 2 TABLETS AT BEDTIME AS NEEDED FOR ANXIETY/INSOMNIA 06/26/22   [name], MD  azithromycin (ZITHROMAX) 500 MG tablet 1 tablet Orally daily for 3 days 07/19/22   [provider]  Calcium Carb-Cholecalciferol (CALCIUM 1000 + D PO) Take 1,000 mg by mouth daily.    [provider]  cetirizine (ZYRTEC) 10 MG tablet 1 tablet Orally Once a day for 30 days 07/19/22   [provider]  clotrimazole-betamethasone (LOTRISONE) cream Apply 1 Application topically 2 (two) times daily as needed (irritation).    [provider]  dexamethasone (DECADRON) 4 MG tablet 5 tablets Orally 30 min prior to infusion every 2 weeks    [provider]   dexAMETHasone 20 MG TABS Take 1 tablet by mouth as directed. Take 1 tablet 30 minutes prior to infusions 05/30/22   [name], MD  diphenhydrAMINE (BENADRYL) 25 MG tablet 1 tablet Orally as needed    [provider]  ELIQUIS 2.5 MG TABS tablet Take 2.5 mg by mouth 2 (two) times daily. 06/14/22   [provider]  escitalopram (LEXAPRO) 20 MG tablet Take 20 mg by mouth daily. 12/18/21   [provider]  HYDROcodone-acetaminophen (NORCO) 10-325 MG tablet Take 1 tablet by mouth every 6 (six) hours as needed. 07/25/22   [name], MD  ibuprofen (ADVIL) 200 MG tablet Take 200 mg by mouth every 6 (six) hours as needed for moderate pain. 04/30/22   Mahala Menghini, [name]  montelukast (SINGULAIR) 10 MG tablet TAKE 1 TABLET BY MOUTH AS DIRECTED 30 MINUTES PRIOR TO TREATMENT 06/21/22   [name], MD  montelukast (SINGULAIR) 10 MG tablet 1 tablet Orally 30 minutes prior to infusion every 2 weeks    [provider]  Multiple Vitamin (MULTI VITAMIN) TABS Take 1 tablet by mouth daily.    [provider]  pantoprazole (PROTONIX) 40 MG tablet Take 1 tablet (40 mg total) by mouth daily. 02/12/22   [name], MD  polyethylene glycol powder (GLYCOLAX/MIRALAX) 17 GM/SCOOP powder Take one capful once to twice daily as needed Patient taking differently: Take 17 g by mouth daily as needed for moderate  constipation. 04/30/22   Mahala Menghini, [name]  prochlorperazine (COMPAZINE) 10 MG tablet Take 1 tablet (10 mg total) by mouth every 6 (six) hours as needed for nausea or vomiting. 06/21/22   [name], MD  rosuvastatin (CRESTOR) 10 MG tablet Take 10 mg by mouth daily. 05/30/20   [provider]  zolpidem (AMBIEN) 10 MG tablet Take 1 tablet (10 mg total) by mouth at bedtime as needed for sleep. 05/23/22   [name], MD      Allergies    Ciprofloxacin, Amoxicillin, Morphine and related, and Augmentin  [amoxicillin-pot clavulanate]    Review of Systems   Review of Systems  Genitourinary:  Positive for flank pain.    Physical Exam Updated Vital Signs BP 115/72   Pulse 63   Temp 98 F (36.7 C)   Resp 15   Ht _0  (1.626 m)   Wt 82.5 kg   SpO2 95%   BMI 31.21 kg/m  Physical Exam Vitals and nursing note reviewed.  Constitutional:      Appearance: She is well-developed.  HENT:     Head: Normocephalic and atraumatic.     Mouth/Throat:     Mouth: Mucous membranes are moist.  Eyes:     Pupils: Pupils are equal, round, and reactive to light.  Cardiovascular:     Rate and Rhythm: Normal rate and regular rhythm.  Pulmonary:     Effort: No respiratory distress.     Breath sounds: No stridor.  Abdominal:     General: There is no distension.  Musculoskeletal:        General: No swelling or tenderness.     Cervical back: Normal range of motion.  Skin:    General: Skin is warm and dry.  Neurological:     General: No focal deficit present.     Mental Status: She is alert.     ED Results / Procedures / Treatments   Labs (all labs ordered are listed, but only abnormal results are displayed) Labs Reviewed  URINALYSIS, ROUTINE W REFLEX MICROSCOPIC - Abnormal; Notable for the following components:      Result Value   Color, Urine STRAW (*)    Glucose, UA >=500 (*)    Bacteria, UA RARE (*)    All other components within normal limits  BASIC METABOLIC PANEL - Abnormal; Notable for the following components:   Glucose, Bld 311 (*)    BUN 30 (*)    Creatinine, Ser 1.67 (*)    GFR, Estimated 32 (*)    All other components within normal limits  CBC - Abnormal; Notable for the following components:   RBC 3.26 (*)    Hemoglobin 10.8 (*)    HCT 33.6 (*)    MCV 103.1 (*)    Platelets 65 (*)    All other components within normal limits  HEPATIC FUNCTION PANEL    EKG None  Radiology CT Renal Stone Study  Result Date: 08/02/2022 CLINICAL DATA:  Right-sided flank pain  for several hours EXAM: CT ABDOMEN AND PELVIS WITHOUT CONTRAST TECHNIQUE: Multidetector CT imaging of the abdomen and pelvis was performed following the standard protocol without IV contrast. RADIATION DOSE REDUCTION: This exam was performed according to the departmental dose-optimization program which includes automated exposure control, adjustment of the mA and/or kV according to patient size and/or use of iterative reconstruction technique. COMPARISON:  05/24/2022 PET-CT FINDINGS: Lower chest: Calcified granuloma is noted in the right middle lobe. No focal infiltrate is seen. Hepatobiliary: Fatty  infiltration of the liver is noted. Gallbladder is within normal limits. Pancreas: Unremarkable. No pancreatic ductal dilatation or surrounding inflammatory changes. Spleen: Normal in size without focal abnormality. Adrenals/Urinary Tract: Adrenal glands are within normal limits. Kidneys are well visualized bilaterally. No renal calculi or obstructive changes are seen. The ureters are within normal limits. The bladder is decompressed. Stomach/Bowel: Fecal material is noted throughout the colon consistent with a mild degree of constipation. The appendix has been surgically removed. Small bowel and stomach are within normal limits. Vascular/Lymphatic: Aortic atherosclerosis. No enlarged abdominal or pelvic lymph nodes. Reproductive: Status post hysterectomy. No adnexal masses. Other: No abdominal wall hernia or abnormality. No abdominopelvic ascites. Musculoskeletal: Multiple lytic lesions are noted scattered throughout the bony structures consistent with the given clinical history of myeloma. IMPRESSION: No renal calculi or obstructive changes are noted. Mild colonic constipation is noted. Changes of multiple myeloma. Electronically Signed   By: Inez Catalina M.D.   On: 08/02/2022 02:45    Procedures Procedures    Medications Ordered in ED Medications  lactated ringers bolus 1,000 mL (0 mLs Intravenous Stopped  08/02/22 0539)  HYDROmorphone (DILAUDID) injection 1 mg (1 mg Intravenous Given 08/02/22 0308)  methocarbamol (ROBAXIN) tablet 500 mg (500 mg Oral Given 08/02/22 0402)    ED Course/ Medical Decision Making/ A&P                           Medical Decision Making Amount and/or Complexity of Data Reviewed Labs: ordered. Radiology: ordered.  Risk Prescription drug management.   Eval for new pelvic lesions related to MM vs nephrolithiasis vs pyelonephritis.   CT stone study done and showed no obvious stone, perinephric stranding or other obvious etiology for symptoms (independently viewed and interpreted by myself and radiology read reviewed). Urine clean without evidence of UTI. Pain meds provided.  Symptoms improved. Workup reassuring. Will d/c on symptomatic treatment for presumed muscular pain. Has appointment later today for recheck.    Final Clinical Impression(s) / ED Diagnoses Final diagnoses:  Right flank pain    Rx / DC Orders ED Discharge Orders          Ordered    methocarbamol (ROBAXIN) 500 MG tablet  Every 8 hours PRN        08/02/22 0402              Nicklous Aburto, Corene Cornea, MD 08/02/22 [phone]

## 2022-08-02 NOTE — Patient Instructions (Signed)
[facility]  Discharge Instructions: Thank you for choosing [facility] to provide your oncology and hematology care.  If you have a lab appointment with the [facility], please come in thru the Main Entrance and check in at the main information desk.  Wear comfortable clothing and clothing appropriate for easy access to any Portacath or PICC line.   We strive to give you quality time with your provider. You may need to reschedule your appointment if you arrive late (15 or more minutes).  Arriving late affects you and other patients whose appointments are after yours.  Also, if you miss three or more appointments without notifying the office, you may be dismissed from the clinic at the provider's discretion.      For prescription refill requests, have your pharmacy contact our office and allow 72 hours for refills to be completed.    Today you received the following chemotherapy velcade   To help prevent nausea and vomiting after your treatment, we encourage you to take your nausea medication as directed.  BELOW ARE SYMPTOMS THAT SHOULD BE REPORTED IMMEDIATELY: *FEVER GREATER THAN 100.4 F (38 C) OR HIGHER *CHILLS OR SWEATING *NAUSEA AND VOMITING THAT IS NOT CONTROLLED WITH YOUR NAUSEA MEDICATION *UNUSUAL SHORTNESS OF BREATH *UNUSUAL BRUISING OR BLEEDING *URINARY PROBLEMS (pain or burning when urinating, or frequent urination) *BOWEL PROBLEMS (unusual diarrhea, constipation, pain near the anus) TENDERNESS IN MOUTH AND THROAT WITH OR WITHOUT PRESENCE OF ULCERS (sore throat, sores in mouth, or a toothache) UNUSUAL RASH, SWELLING OR PAIN  UNUSUAL VAGINAL DISCHARGE OR ITCHING   Items with * indicate a potential emergency and should be followed up as soon as possible or go to the Emergency Department if any problems should occur.  Please show the CHEMOTHERAPY ALERT CARD or IMMUNOTHERAPY ALERT CARD at check-in to the Emergency Department and triage  nurse.  Should you have questions after your visit or need to cancel or reschedule your appointment, please contact [facility] [phone]  and follow the prompts.  Office hours are 8:00 a.m. to 4:30 p.m. Monday - Friday. Please note that voicemails left after 4:00 p.m. may not be returned until the following business day.  We are closed weekends and major holidays. You have access to a nurse at all times for urgent questions. Please call the main number to the clinic [phone] and follow the prompts.  For any non-urgent questions, you may also contact your provider using MyChart. We now offer e-Visits for anyone 93 and older to request care online for non-urgent symptoms. For details visit [email or web address].   Also download the MyChart app! Go to the app store, search "MyChart", open the app, select [facility], and log in with your MyChart username and password.  Masks are optional in the cancer centers. If you would like for your care team to wear a mask while they are taking care of you, please let them know. You may have one support person who is at least 74 years old accompany you for your appointments.

## 2022-08-02 NOTE — Progress Notes (Signed)
Labs reviewed today with MD. Madaline Brilliant to treat as planned.    Treatment given per orders. Patient tolerated it well without problems. Vitals stable and discharged home from clinic ambulatory. Follow up as scheduled.

## 2022-08-03 ENCOUNTER — Encounter (HOSPITAL_COMMUNITY): Payer: Self-pay | Admitting: *Deleted

## 2022-08-03 ENCOUNTER — Emergency Department (HOSPITAL_COMMUNITY)
Admission: EM | Admit: 2022-08-03 | Discharge: 2022-08-04 | Disposition: A | Discharge status: Home / Self Care | Payer: Medicare Other | Attending: Emergency Medicine | Admitting: Emergency Medicine

## 2022-08-03 ENCOUNTER — Other Ambulatory Visit: Payer: Self-pay

## 2022-08-03 DIAGNOSIS — C9000 Multiple myeloma not having achieved remission: Secondary | ICD-10-CM | POA: Diagnosis not present

## 2022-08-03 DIAGNOSIS — R1010 Upper abdominal pain, unspecified: Secondary | ICD-10-CM | POA: Insufficient documentation

## 2022-08-03 DIAGNOSIS — R10817 Generalized abdominal tenderness: Secondary | ICD-10-CM | POA: Insufficient documentation

## 2022-08-03 DIAGNOSIS — R109 Unspecified abdominal pain: Secondary | ICD-10-CM | POA: Diagnosis present

## 2022-08-03 MED ORDER — ONDANSETRON HCL 4 MG/2ML IJ SOLN
4.0000 mg | Freq: Once | INTRAMUSCULAR | Status: AC
  Administered 2022-08-04: 4 mg via INTRAVENOUS
  Filled 2022-08-03: qty 2

## 2022-08-03 MED ORDER — SODIUM CHLORIDE 0.9 % IV BOLUS
1000.0000 mL | Freq: Once | INTRAVENOUS | Status: AC
  Administered 2022-08-04: 1000 mL via INTRAVENOUS

## 2022-08-03 MED ORDER — FENTANYL CITRATE PF 50 MCG/ML IJ SOSY
50.0000 ug | PREFILLED_SYRINGE | Freq: Once | INTRAMUSCULAR | Status: AC
  Administered 2022-08-04: 50 ug via INTRAVENOUS
  Filled 2022-08-03: qty 1

## 2022-08-03 NOTE — ED Provider Notes (Signed)
[facility] EMERGENCY DEPARTMENT Provider Note   CSN: [id number] Arrival date & time: 08/03/22  2145     History {Add pertinent medical, surgical, social history, OB history to HPI:1} Chief Complaint  Patient presents with   Abdominal Pain    Alexandra Price is a 76 y.o. female.  Presents to the emergency department for evaluation of abdominal pain.  Patient reports pain across the right side of her abdomen that radiates into the back.  She reports that this has been persistent for days.  Patient was seen in the ED on the 13th for the symptoms.  She reports that the pain is still persistent and she would like to know why she is having pain.  Patient reports that she receives intra-abdominal infusions for her cancer and this seems to make the pain worse.       Home Medications Prior to Admission medications   Medication Sig Start Date End Date Taking? Authorizing Provider  acyclovir (ZOVIRAX) 400 MG tablet Take 1 tablet (400 mg total) by mouth 2 (two) times daily. 06/22/22   Harriett Rush, [name]  albuterol (VENTOLIN HFA) 108 (90 Base) MCG/ACT inhaler 1 puff as needed Inhalation every 4 hrs for 30 days 07/19/22   [provider]  ALPRAZolam (XANAX) 0.5 MG tablet TAKE 1 TABLET BY MOUTH DURING THE DAY AS NEEDED FOR ANXIETY AND 2 TABLETS AT BEDTIME AS NEEDED FOR ANXIETY/INSOMNIA 06/26/22   [name], MD  azithromycin (ZITHROMAX) 500 MG tablet 1 tablet Orally daily for 3 days 07/19/22   [provider]  Calcium Carb-Cholecalciferol (CALCIUM 1000 + D PO) Take 1,000 mg by mouth daily.    [provider]  cetirizine (ZYRTEC) 10 MG tablet 1 tablet Orally Once a day for 30 days 07/19/22   [provider]  clotrimazole-betamethasone (LOTRISONE) cream Apply 1 Application topically 2 (two) times daily as needed (irritation).    [provider]  dexamethasone (DECADRON) 4 MG tablet 5 tablets Orally 30 min prior to infusion every 2 weeks     [provider]  dexAMETHasone 20 MG TABS Take 1 tablet by mouth as directed. Take 1 tablet 30 minutes prior to infusions 05/30/22   [name], MD  diphenhydrAMINE (BENADRYL) 25 MG tablet 1 tablet Orally as needed    [provider]  ELIQUIS 2.5 MG TABS tablet Take 2.5 mg by mouth 2 (two) times daily. 06/14/22   [provider]  escitalopram (LEXAPRO) 20 MG tablet Take 20 mg by mouth daily. 12/18/21   [provider]  HYDROcodone-acetaminophen (NORCO) 10-325 MG tablet Take 1 tablet by mouth every 6 (six) hours as needed. 07/25/22   [name], MD  ibuprofen (ADVIL) 200 MG tablet Take 200 mg by mouth every 6 (six) hours as needed for moderate pain. 04/30/22   Mahala Menghini, [name]  methocarbamol (ROBAXIN) 500 MG tablet Take 1 tablet (500 mg total) by mouth every 8 (eight) hours as needed for muscle spasms (back pain). 08/02/22   Mesner, Corene Cornea, MD  montelukast (SINGULAIR) 10 MG tablet TAKE 1 TABLET BY MOUTH AS DIRECTED 30 MINUTES PRIOR TO TREATMENT 06/21/22   [name], MD  montelukast (SINGULAIR) 10 MG tablet 1 tablet Orally 30 minutes prior to infusion every 2 weeks    [provider]  Multiple Vitamin (MULTI VITAMIN) TABS Take 1 tablet by mouth daily.    [provider]  pantoprazole (PROTONIX) 40 MG tablet Take 1 tablet (40 mg total) by mouth daily. 02/12/22  [name], MD  polyethylene glycol powder [facility]) 17 GM/SCOOP powder Take one capful once to twice daily as needed Patient taking differently: Take 17 g by mouth daily as needed for moderate constipation. 04/30/22   Mahala Menghini, [name]  prochlorperazine (COMPAZINE) 10 MG tablet Take 1 tablet (10 mg total) by mouth every 6 (six) hours as needed for nausea or vomiting. 06/21/22   [name], MD  rosuvastatin (CRESTOR) 10 MG tablet Take 10 mg by mouth daily. 05/30/20   [provider]  zolpidem (AMBIEN) 10 MG tablet Take 1  tablet (10 mg total) by mouth at bedtime as needed for sleep. 05/23/22   [name], MD      Allergies    Ciprofloxacin, Amoxicillin, Morphine and related, and Augmentin [amoxicillin-pot clavulanate]    Review of Systems   Review of Systems  Physical Exam Updated Vital Signs BP (!) 156/93 (BP Location: Right Arm)   Pulse 90   Temp 98.8 F (37.1 C) (Oral)   Resp 18   Ht _0  (1.626 m)   Wt 82.1 kg   SpO2 98%   BMI 31.07 kg/m  Physical Exam Vitals and nursing note reviewed.  Constitutional:      General: She is not in acute distress.    Appearance: She is well-developed.  HENT:     Head: Normocephalic and atraumatic.     Mouth/Throat:     Mouth: Mucous membranes are moist.  Eyes:     General: Vision grossly intact. Gaze aligned appropriately.     Extraocular Movements: Extraocular movements intact.     Conjunctiva/sclera: Conjunctivae normal.  Cardiovascular:     Rate and Rhythm: Normal rate and regular rhythm.     Pulses: Normal pulses.     Heart sounds: Normal heart sounds, S1 normal and S2 normal. No murmur heard.    No friction rub. No gallop.  Pulmonary:     Effort: Pulmonary effort is normal. No respiratory distress.     Breath sounds: Normal breath sounds.  Abdominal:     General: Bowel sounds are normal.     Palpations: Abdomen is soft.     Tenderness: There is generalized abdominal tenderness. There is no guarding or rebound.     Hernia: No hernia is present.  Musculoskeletal:        General: No swelling.     Cervical back: Full passive range of motion without pain, normal range of motion and neck supple. No spinous process tenderness or muscular tenderness. Normal range of motion.     Right lower leg: No edema.     Left lower leg: No edema.  Skin:    General: Skin is warm and dry.     Capillary Refill: Capillary refill takes less than 2 seconds.     Findings: No ecchymosis, erythema, rash or wound.  Neurological:     General: No focal deficit  present.     Mental Status: She is alert and oriented to person, place, and time.     GCS: GCS eye subscore is 4. GCS verbal subscore is 5. GCS motor subscore is 6.     Cranial Nerves: Cranial nerves 2-12 are intact.     Sensory: Sensation is intact.     Motor: Motor function is intact.     Coordination: Coordination is intact.  Psychiatric:        Attention and Perception: Attention normal.        Mood and Affect: Mood normal.  Speech: Speech normal.        Behavior: Behavior normal.     ED Results / Procedures / Treatments   Labs (all labs ordered are listed, but only abnormal results are displayed) Labs Reviewed - No data to display  EKG None  Radiology CT Renal Stone Study  Result Date: 08/02/2022 CLINICAL DATA:  Right-sided flank pain for several hours EXAM: CT ABDOMEN AND PELVIS WITHOUT CONTRAST TECHNIQUE: Multidetector CT imaging of the abdomen and pelvis was performed following the standard protocol without IV contrast. RADIATION DOSE REDUCTION: This exam was performed according to the departmental dose-optimization program which includes automated exposure control, adjustment of the mA and/or kV according to patient size and/or use of iterative reconstruction technique. COMPARISON:  05/24/2022 PET-CT FINDINGS: Lower chest: Calcified granuloma is noted in the right middle lobe. No focal infiltrate is seen. Hepatobiliary: Fatty infiltration of the liver is noted. Gallbladder is within normal limits. Pancreas: Unremarkable. No pancreatic ductal dilatation or surrounding inflammatory changes. Spleen: Normal in size without focal abnormality. Adrenals/Urinary Tract: Adrenal glands are within normal limits. Kidneys are well visualized bilaterally. No renal calculi or obstructive changes are seen. The ureters are within normal limits. The bladder is decompressed. Stomach/Bowel: Fecal material is noted throughout the colon consistent with a mild degree of constipation. The appendix  has been surgically removed. Small bowel and stomach are within normal limits. Vascular/Lymphatic: Aortic atherosclerosis. No enlarged abdominal or pelvic lymph nodes. Reproductive: Status post hysterectomy. No adnexal masses. Other: No abdominal wall hernia or abnormality. No abdominopelvic ascites. Musculoskeletal: Multiple lytic lesions are noted scattered throughout the bony structures consistent with the given clinical history of myeloma. IMPRESSION: No renal calculi or obstructive changes are noted. Mild colonic constipation is noted. Changes of multiple myeloma. Electronically Signed   By: Inez Catalina M.D.   On: 08/02/2022 02:45    Procedures Procedures  {Document cardiac monitor, telemetry assessment procedure when appropriate:1}  Medications Ordered in ED Medications - No data to display  ED Course/ Medical Decision Making/ A&P                           Medical Decision Making  ***  {Document critical care time when appropriate:1} {Document review of labs and clinical decision tools ie heart score, Chads2Vasc2 etc:1}  {Document your independent review of radiology images, and any outside records:1} {Document your discussion with family members, caretakers, and with consultants:1} {Document social determinants of health affecting pt's care:1} {Document your decision making why or why not admission, treatments were needed:1} Final Clinical Impression(s) / ED Diagnoses Final diagnoses:  None    Rx / DC Orders ED Discharge Orders     None

## 2022-08-03 NOTE — ED Notes (Signed)
Pt alert, oriented, and ambulatory. Pt is concerned that she is still having RT sided abdominal pain that radiates to RT flank. She reports this pain is the same as previous visit. She would like to know why she is still having this pain. She has 5 episodes of loose stool in the last 24 hours after she took laxative for constipation. Next oncology appt is 12/26. Bryson Corona Edd Fabian

## 2022-08-03 NOTE — ED Triage Notes (Signed)
Pt with RUQ and mid back pain, ongoing few past days.  Constant today. +nausea and diarrhea. Pt was told constipation yesterday for infusion.

## 2022-08-03 NOTE — ED Notes (Signed)
Pt had chemo infusion yesterday.

## 2022-08-04 ENCOUNTER — Emergency Department (HOSPITAL_COMMUNITY): Payer: Medicare Other

## 2022-08-04 ENCOUNTER — Other Ambulatory Visit: Payer: Self-pay

## 2022-08-04 ENCOUNTER — Emergency Department (HOSPITAL_COMMUNITY)
Admission: EM | Admit: 2022-08-04 | Discharge: 2022-08-04 | Disposition: A | Discharge status: Home / Self Care | Payer: Medicare Other | Source: Home / Self Care | Attending: Emergency Medicine | Admitting: Emergency Medicine

## 2022-08-04 ENCOUNTER — Encounter (HOSPITAL_COMMUNITY): Payer: Self-pay

## 2022-08-04 DIAGNOSIS — C9000 Multiple myeloma not having achieved remission: Secondary | ICD-10-CM | POA: Insufficient documentation

## 2022-08-04 DIAGNOSIS — R1010 Upper abdominal pain, unspecified: Secondary | ICD-10-CM | POA: Insufficient documentation

## 2022-08-04 DIAGNOSIS — S22020A Wedge compression fracture of second thoracic vertebra, initial encounter for closed fracture: Secondary | ICD-10-CM

## 2022-08-04 LAB — COMPREHENSIVE METABOLIC PANEL
ALT: 28 U/L (ref 0–44)
ALT: 28 U/L (ref 0–44)
AST: 22 U/L (ref 15–41)
AST: 25 U/L (ref 15–41)
Albumin: 3.5 g/dL (ref 3.5–5.0)
Albumin: 3.6 g/dL (ref 3.5–5.0)
Alkaline Phosphatase: 72 U/L (ref 38–126)
Alkaline Phosphatase: 74 U/L (ref 38–126)
Anion gap: 6 (ref 5–15)
Anion gap: 7 (ref 5–15)
BUN: 20 mg/dL (ref 8–23)
BUN: 27 mg/dL — ABNORMAL HIGH (ref 8–23)
CO2: 26 mmol/L (ref 22–32)
CO2: 27 mmol/L (ref 22–32)
Calcium: 8.5 mg/dL — ABNORMAL LOW (ref 8.9–10.3)
Calcium: 8.8 mg/dL — ABNORMAL LOW (ref 8.9–10.3)
Chloride: 102 mmol/L (ref 98–111)
Chloride: 104 mmol/L (ref 98–111)
Creatinine, Ser: 1.34 mg/dL — ABNORMAL HIGH (ref 0.44–1.00)
Creatinine, Ser: 1.55 mg/dL — ABNORMAL HIGH (ref 0.44–1.00)
GFR, Estimated: 35 mL/min — ABNORMAL LOW (ref >60)
GFR, Estimated: 42 mL/min — ABNORMAL LOW (ref >60)
Glucose, Bld: 180 mg/dL — ABNORMAL HIGH (ref 70–99)
Glucose, Bld: 204 mg/dL — ABNORMAL HIGH (ref 70–99)
Potassium: 3.6 mmol/L (ref 3.5–5.1)
Potassium: 4.2 mmol/L (ref 3.5–5.1)
Sodium: 135 mmol/L (ref 135–145)
Sodium: 137 mmol/L (ref 135–145)
Total Bilirubin: 0.4 mg/dL (ref 0.3–1.2)
Total Bilirubin: 0.6 mg/dL (ref 0.3–1.2)
Total Protein: 6.2 g/dL — ABNORMAL LOW (ref 6.5–8.1)
Total Protein: 6.3 g/dL — ABNORMAL LOW (ref 6.5–8.1)

## 2022-08-04 LAB — URINALYSIS, ROUTINE W REFLEX MICROSCOPIC
Bacteria, UA: NONE SEEN
Bilirubin Urine: NEGATIVE
Glucose, UA: NEGATIVE mg/dL
Hgb urine dipstick: NEGATIVE
Ketones, ur: NEGATIVE mg/dL
Leukocytes,Ua: NEGATIVE
Nitrite: NEGATIVE
Protein, ur: 30 mg/dL — AB
Specific Gravity, Urine: 1.02 (ref 1.005–1.030)
pH: 5 (ref 5.0–8.0)

## 2022-08-04 LAB — CBC WITH DIFFERENTIAL/PLATELET
Abs Immature Granulocytes: 0.03 10*3/uL (ref 0.00–0.07)
Abs Immature Granulocytes: 0.01 10*3/uL (ref 0.00–0.07)
Basophils Absolute: 0 10*3/uL (ref 0.0–0.1)
Basophils Absolute: 0 10*3/uL (ref 0.0–0.1)
Basophils Relative: 0 %
Basophils Relative: 0 %
Eosinophils Absolute: 0 10*3/uL (ref 0.0–0.5)
Eosinophils Absolute: 0 10*3/uL (ref 0.0–0.5)
Eosinophils Relative: 1 %
Eosinophils Relative: 0 %
HCT: 29 % — ABNORMAL LOW (ref 36.0–46.0)
HCT: 29.3 % — ABNORMAL LOW (ref 36.0–46.0)
Hemoglobin: 9.5 g/dL — ABNORMAL LOW (ref 12.0–15.0)
Hemoglobin: 9.6 g/dL — ABNORMAL LOW (ref 12.0–15.0)
Immature Granulocytes: 1 %
Immature Granulocytes: 0 %
Lymphocytes Relative: 13 %
Lymphocytes Relative: 12 %
Lymphs Abs: 0.5 10*3/uL — ABNORMAL LOW (ref 0.7–4.0)
Lymphs Abs: 0.4 10*3/uL — ABNORMAL LOW (ref 0.7–4.0)
MCH: 33.6 pg (ref 26.0–34.0)
MCH: 33.2 pg (ref 26.0–34.0)
MCHC: 32.8 g/dL (ref 30.0–36.0)
MCHC: 32.8 g/dL (ref 30.0–36.0)
MCV: 102.5 fL — ABNORMAL HIGH (ref 80.0–100.0)
MCV: 101.4 fL — ABNORMAL HIGH (ref 80.0–100.0)
Monocytes Absolute: 0.5 10*3/uL (ref 0.1–1.0)
Monocytes Absolute: 0.4 10*3/uL (ref 0.1–1.0)
Monocytes Relative: 13 %
Monocytes Relative: 11 %
Neutro Abs: 2.9 10*3/uL (ref 1.7–7.7)
Neutro Abs: 2.7 10*3/uL (ref 1.7–7.7)
Neutrophils Relative %: 72 %
Neutrophils Relative %: 77 %
Platelets: 41 10*3/uL — ABNORMAL LOW (ref 150–400)
Platelets: 40 10*3/uL — ABNORMAL LOW (ref 150–400)
RBC: 2.83 MIL/uL — ABNORMAL LOW (ref 3.87–5.11)
RBC: 2.89 MIL/uL — ABNORMAL LOW (ref 3.87–5.11)
RDW: 13.8 % (ref 11.5–15.5)
RDW: 13.7 % (ref 11.5–15.5)
WBC: 3.9 10*3/uL — ABNORMAL LOW (ref 4.0–10.5)
WBC: 3.6 10*3/uL — ABNORMAL LOW (ref 4.0–10.5)
nRBC: 0.5 % — ABNORMAL HIGH (ref 0.0–0.2)
nRBC: 0.6 % — ABNORMAL HIGH (ref 0.0–0.2)

## 2022-08-04 LAB — LIPASE, BLOOD
Lipase: 38 U/L (ref 11–51)
Lipase: 47 U/L (ref 11–51)

## 2022-08-04 LAB — LACTIC ACID, PLASMA: Lactic Acid, Venous: 0.9 mmol/L (ref 0.5–1.9)

## 2022-08-04 MED ORDER — FENTANYL CITRATE PF 50 MCG/ML IJ SOSY
50.0000 ug | PREFILLED_SYRINGE | Freq: Once | INTRAMUSCULAR | Status: AC
  Administered 2022-08-04: 50 ug via INTRAVENOUS
  Filled 2022-08-04: qty 1

## 2022-08-04 MED ORDER — IOHEXOL 300 MG/ML  SOLN
80.0000 mL | Freq: Once | INTRAMUSCULAR | Status: AC | PRN
  Administered 2022-08-04: 80 mL via INTRAVENOUS

## 2022-08-04 MED ORDER — ONDANSETRON HCL 4 MG/2ML IJ SOLN
4.0000 mg | Freq: Once | INTRAMUSCULAR | Status: AC
  Administered 2022-08-04: 4 mg via INTRAVENOUS
  Filled 2022-08-04: qty 2

## 2022-08-04 MED ORDER — HEPARIN SOD (PORK) LOCK FLUSH 100 UNIT/ML IV SOLN
500.0000 [IU] | Freq: Once | INTRAVENOUS | Status: AC
  Administered 2022-08-04: 500 [IU]

## 2022-08-04 MED ORDER — OXYCODONE HCL 5 MG PO TABS
5.0000 mg | ORAL_TABLET | Freq: Once | ORAL | Status: AC
  Administered 2022-08-04: 5 mg via ORAL
  Filled 2022-08-04: qty 1

## 2022-08-04 MED ORDER — SODIUM CHLORIDE 0.9 % IV BOLUS
500.0000 mL | Freq: Once | INTRAVENOUS | Status: AC
  Administered 2022-08-04: 500 mL via INTRAVENOUS

## 2022-08-04 MED ORDER — OXYCODONE-ACETAMINOPHEN 5-325 MG PO TABS
1.0000 | ORAL_TABLET | Freq: Once | ORAL | Status: AC
  Administered 2022-08-04: 1 via ORAL
  Filled 2022-08-04: qty 1

## 2022-08-04 MED ORDER — OXYCODONE HCL 5 MG PO TABS: 5.0000 mg | ORAL_TABLET | ORAL | 0 refills | Status: DC | PRN

## 2022-08-04 MED ORDER — HEPARIN SOD (PORK) LOCK FLUSH 100 UNIT/ML IV SOLN
INTRAVENOUS | Status: AC
  Filled 2022-08-04: qty 5

## 2022-08-04 MED ORDER — POLYETHYLENE GLYCOL 3350 17 G PO PACK: 17.0000 g | PACK | Freq: Every day | ORAL | 0 refills | Status: AC | PRN

## 2022-08-04 NOTE — Discharge Instructions (Addendum)
We do not see any dangerous causes of your abdominal pain.  You need to follow-up with your doctor for any further treatment of this pain.

## 2022-08-04 NOTE — ED Provider Notes (Signed)
[facility] EMERGENCY DEPARTMENT Provider Note   CSN: [id number] Arrival date & time: 08/04/22  1144     History  Chief Complaint  Patient presents with   Back Pain   Abdominal Pain    Alexandra Price is a 76 y.o. female.  HPI Patient presents 2 days after last being seen here, and after speaking with her physician earlier today, and being encouraged to come to the ED for pain control and with suspicion for worsening metastatic disease.  Patient has a history of active malignancy, is receiving therapy for her multiple myeloma.  She notes over the past days she has had worsening pain in the upper abdomen, lower back, without new incontinence, loss of sensation anywhere, without substantial relief with Vicodin, which was previously functioning for pain control.  After speaking with oncologist's office she was sent in for evaluation.    Home Medications Prior to Admission medications   Medication Sig Start Date End Date Taking? Authorizing Provider  acyclovir (ZOVIRAX) 400 MG tablet Take 1 tablet (400 mg total) by mouth 2 (two) times daily. 06/22/22   Harriett Rush, [name]  albuterol (VENTOLIN HFA) 108 (90 Base) MCG/ACT inhaler 1 puff as needed Inhalation every 4 hrs for 30 days 07/19/22   [provider]  ALPRAZolam (XANAX) 0.5 MG tablet TAKE 1 TABLET BY MOUTH DURING THE DAY AS NEEDED FOR ANXIETY AND 2 TABLETS AT BEDTIME AS NEEDED FOR ANXIETY/INSOMNIA 06/26/22   [name], MD  azithromycin (ZITHROMAX) 500 MG tablet 1 tablet Orally daily for 3 days 07/19/22   [provider]  Calcium Carb-Cholecalciferol (CALCIUM 1000 + D PO) Take 1,000 mg by mouth daily.    [provider]  cetirizine (ZYRTEC) 10 MG tablet 1 tablet Orally Once a day for 30 days 07/19/22   [provider]  clotrimazole-betamethasone (LOTRISONE) cream Apply 1 Application topically 2 (two) times daily as needed (irritation).    [provider]  dexamethasone  (DECADRON) 4 MG tablet 5 tablets Orally 30 min prior to infusion every 2 weeks    [provider]  dexAMETHasone 20 MG TABS Take 1 tablet by mouth as directed. Take 1 tablet 30 minutes prior to infusions 05/30/22   [name], MD  diphenhydrAMINE (BENADRYL) 25 MG tablet 1 tablet Orally as needed    [provider]  ELIQUIS 2.5 MG TABS tablet Take 2.5 mg by mouth 2 (two) times daily. 06/14/22   [provider]  escitalopram (LEXAPRO) 20 MG tablet Take 20 mg by mouth daily. 12/18/21   [provider]  HYDROcodone-acetaminophen (NORCO) 10-325 MG tablet Take 1 tablet by mouth every 6 (six) hours as needed. 07/25/22   [name], MD  ibuprofen (ADVIL) 200 MG tablet Take 200 mg by mouth every 6 (six) hours as needed for moderate pain. 04/30/22   Mahala Menghini, [name]  methocarbamol (ROBAXIN) 500 MG tablet Take 1 tablet (500 mg total) by mouth every 8 (eight) hours as needed for muscle spasms (back pain). 08/02/22   Mesner, Corene Cornea, MD  montelukast (SINGULAIR) 10 MG tablet TAKE 1 TABLET BY MOUTH AS DIRECTED 30 MINUTES PRIOR TO TREATMENT 06/21/22   [name], MD  montelukast (SINGULAIR) 10 MG tablet 1 tablet Orally 30 minutes prior to infusion every 2 weeks    [provider]  Multiple Vitamin (MULTI VITAMIN) TABS Take 1 tablet by mouth daily.    [provider]  pantoprazole (PROTONIX) 40 MG tablet Take 1 tablet (40 mg total) by mouth  daily. 02/12/22   [name], MD  polyethylene glycol (MIRALAX / GLYCOLAX) 17 g packet Take 17 g by mouth daily as needed for moderate constipation. 08/04/22   Orpah Greek, MD  prochlorperazine (COMPAZINE) 10 MG tablet Take 1 tablet (10 mg total) by mouth every 6 (six) hours as needed for nausea or vomiting. 06/21/22   [name], MD  rosuvastatin (CRESTOR) 10 MG tablet Take 10 mg by mouth daily. 05/30/20   [provider]  zolpidem (AMBIEN) 10 MG tablet Take 1  tablet (10 mg total) by mouth at bedtime as needed for sleep. 05/23/22   [name], MD      Allergies    Ciprofloxacin, Amoxicillin, Morphine and related, and Augmentin [amoxicillin-pot clavulanate]    Review of Systems   Review of Systems  All other systems reviewed and are negative.   Physical Exam Updated Vital Signs BP (!) 167/92   Pulse 77   Temp 98 F (36.7 C) (Oral)   Resp 19   Ht _0  (1.626 m)   Wt 82.1 kg   SpO2 94%   BMI 31.07 kg/m  Physical Exam Vitals and nursing note reviewed.  Constitutional:      General: She is not in acute distress.    Appearance: She is well-developed.  HENT:     Head: Normocephalic and atraumatic.  Eyes:     Conjunctiva/sclera: Conjunctivae normal.  Cardiovascular:     Rate and Rhythm: Normal rate and regular rhythm.  Pulmonary:     Effort: Pulmonary effort is normal. No respiratory distress.     Breath sounds: Normal breath sounds. No stridor.  Abdominal:     General: There is no distension.     Tenderness: There is generalized abdominal tenderness and tenderness in the epigastric area.  Skin:    General: Skin is warm and dry.  Neurological:     General: No focal deficit present.     Mental Status: She is alert and oriented to person, place, and time.     Cranial Nerves: No cranial nerve deficit.  Psychiatric:        Mood and Affect: Mood normal.     ED Results / Procedures / Treatments   Labs (all labs ordered are listed, but only abnormal results are displayed) Labs Reviewed  COMPREHENSIVE METABOLIC PANEL  CBC WITH DIFFERENTIAL/PLATELET  LIPASE, BLOOD    EKG None  Radiology No results found.  Procedures Procedures    Medications Ordered in ED Medications  fentaNYL (SUBLIMAZE) injection 50 mcg (has no administration in time range)  ondansetron (ZOFRAN) injection 4 mg (has no administration in time range)  sodium chloride 0.9 % bolus 500 mL (has no administration in time range)    ED Course/  Medical Decision Making/ A&P This patient with a Hx of multiple myeloma presents to the ED for concern of abdominal pain, back pain, this involves an extensive number of treatment options, and is a complaint that carries with it a high risk of complications and morbidity.    The differential diagnosis includes progression of disease, progression of metastatic lesions identified on recent PET scan infection, obstruction   Social Determinants of Health:  Obesity, active cancer, advanced age  Additional history obtained:  Additional history and/or information obtained from chart review, notable for PET scan last week results below:  IMPRESSION: 1. Single new hypermetabolic lytic myelomatous lesion involving the left lamina of T11. No canal encroachment. No other hypermetabolic bone lesions are identified. Stable diffuse lytic myelomatous  lesions throughout the axial and appendicular skeleton. 2. No significant findings involving the neck, chest, abdomen or pelvis. 3. Diffuse fatty infiltration of the liver.   After the initial evaluation, orders, including: Labs IV narcotics, CT were initiated.   Patient placed on Cardiac and Pulse-Oximetry Monitors. The patient was maintained on a cardiac monitor.  The cardiac monitored showed an rhythm of 75 sinus normal The patient was also maintained on pulse oximetry. The readings were typically 100% room air normal   On repeat evaluation of the patient stayed the same  Lab Tests:  I personally interpreted labs.  The pertinent results include:  leukopenia  Imaging Studies ordered:  On sign-out imaging studies pending   Dispostion / Final MDM:  After consideration of the diagnostic results and the patient's response to treatment, this elderly F w MM p/w abd pain.  She has a non-peritoneal abd exam and vs are initially reassuring.  W recent PET showing bone lesion from MM, concern for progression of disease vs. Other acute abd process  were primary differential items.  Patient required substantial pain meds.  On sign-out, CT pending, as is dispo.  Final Clinical Impression(s) / ED Diagnoses Abdominal pain Back pain  Multiple myeloma     Carmin Muskrat, MD 08/04/22 2300

## 2022-08-04 NOTE — ED Notes (Signed)
Pt alert, oriented, and ambulatory upon DC. She agrees to f/u with PCP or Dr Raliegh Ip. Alexandra Price

## 2022-08-04 NOTE — ED Notes (Signed)
Patient ambulated to restroom with steady gait. Alexandra Price

## 2022-08-04 NOTE — ED Provider Notes (Signed)
Pt signed out by Dr. Vanita Panda pending CT scans.  CT scans reviewed by me.  I agree with the radiologist.  CT thoracic and lumbar: . New superior endplate compression fracture of the T2 vertebral  body with approximately 30% vertebral body height loss.  2. Chronic superior endplate compression fractures of T11 and L4,  unchanged.  3. Heterogeneous appearance of the osseous structures with numerous  scattered areas of lucency compatible with known multiple myeloma.  4. Hepatic steatosis.  5. Aortic atherosclerosis (ICD10-I70.0).    Pt offered admission for pain control.  However, she wants to go home.  She is on hydrocodone now, so I will prescribe her oxy ir.  Pt is to return if worse.  F/u with Dr. Delton Coombes.      Isla Pence, MD 08/04/22 [phone]

## 2022-08-04 NOTE — ED Triage Notes (Addendum)
Pt presents with acute on chronic back pain x 1 week. Pt now states the pain is in her abd. Pt was seen here multiple times this week for the came complaint. Pt states none of the medications she has been given has worked. Upon chart review it was noted pt had constipation. Pt states she has taken the laxatives and had 6 BMs in last 24 hours without relief of the pain.

## 2022-08-05 ENCOUNTER — Other Ambulatory Visit: Payer: Self-pay

## 2022-08-06 ENCOUNTER — Ambulatory Visit: Payer: Medicare Other | Admitting: Hematology

## 2022-08-06 ENCOUNTER — Ambulatory Visit
Admission: RE | Admit: 2022-08-06 | Discharge: 2022-08-06 | Disposition: A | Discharge status: Home / Self Care | Payer: Medicare Other | Source: Ambulatory Visit | Attending: Radiation Oncology | Admitting: Radiation Oncology

## 2022-08-06 ENCOUNTER — Inpatient Hospital Stay: Payer: Medicare Other

## 2022-08-06 ENCOUNTER — Inpatient Hospital Stay (HOSPITAL_BASED_OUTPATIENT_CLINIC_OR_DEPARTMENT_OTHER): Payer: Medicare Other | Admitting: Hematology

## 2022-08-06 ENCOUNTER — Other Ambulatory Visit: Payer: Self-pay | Admitting: *Deleted

## 2022-08-06 VITALS — BP 122/71 | HR 81 | Temp 97.2°F | Resp 18

## 2022-08-06 DIAGNOSIS — M79629 Pain in unspecified upper arm: Secondary | ICD-10-CM | POA: Insufficient documentation

## 2022-08-06 DIAGNOSIS — F419 Anxiety disorder, unspecified: Secondary | ICD-10-CM | POA: Insufficient documentation

## 2022-08-06 DIAGNOSIS — Z79899 Other long term (current) drug therapy: Secondary | ICD-10-CM | POA: Insufficient documentation

## 2022-08-06 DIAGNOSIS — M545 Low back pain, unspecified: Secondary | ICD-10-CM | POA: Insufficient documentation

## 2022-08-06 DIAGNOSIS — C9000 Multiple myeloma not having achieved remission: Secondary | ICD-10-CM

## 2022-08-06 DIAGNOSIS — Z9484 Stem cells transplant status: Secondary | ICD-10-CM | POA: Insufficient documentation

## 2022-08-06 DIAGNOSIS — Z7901 Long term (current) use of anticoagulants: Secondary | ICD-10-CM | POA: Insufficient documentation

## 2022-08-06 DIAGNOSIS — Z23 Encounter for immunization: Secondary | ICD-10-CM | POA: Insufficient documentation

## 2022-08-06 DIAGNOSIS — Z79624 Long term (current) use of inhibitors of nucleotide synthesis: Secondary | ICD-10-CM | POA: Insufficient documentation

## 2022-08-06 DIAGNOSIS — Z510 Encounter for antineoplastic radiation therapy: Secondary | ICD-10-CM | POA: Insufficient documentation

## 2022-08-06 DIAGNOSIS — N189 Chronic kidney disease, unspecified: Secondary | ICD-10-CM | POA: Insufficient documentation

## 2022-08-06 DIAGNOSIS — Z86711 Personal history of pulmonary embolism: Secondary | ICD-10-CM | POA: Insufficient documentation

## 2022-08-06 DIAGNOSIS — Z5112 Encounter for antineoplastic immunotherapy: Secondary | ICD-10-CM | POA: Diagnosis not present

## 2022-08-06 DIAGNOSIS — Z7961 Long term (current) use of immunomodulator: Secondary | ICD-10-CM | POA: Insufficient documentation

## 2022-08-06 DIAGNOSIS — Z95828 Presence of other vascular implants and grafts: Secondary | ICD-10-CM

## 2022-08-06 DIAGNOSIS — D631 Anemia in chronic kidney disease: Secondary | ICD-10-CM | POA: Insufficient documentation

## 2022-08-06 LAB — COMPREHENSIVE METABOLIC PANEL
ALT: 23 U/L (ref 0–44)
AST: 22 U/L (ref 15–41)
Albumin: 3.7 g/dL (ref 3.5–5.0)
Alkaline Phosphatase: 70 U/L (ref 38–126)
Anion gap: 9 (ref 5–15)
BUN: 19 mg/dL (ref 8–23)
CO2: 27 mmol/L (ref 22–32)
Calcium: 8.6 mg/dL — ABNORMAL LOW (ref 8.9–10.3)
Chloride: 98 mmol/L (ref 98–111)
Creatinine, Ser: 1.52 mg/dL — ABNORMAL HIGH (ref 0.44–1.00)
GFR, Estimated: 36 mL/min — ABNORMAL LOW (ref >60)
Glucose, Bld: 212 mg/dL — ABNORMAL HIGH (ref 70–99)
Potassium: 4 mmol/L (ref 3.5–5.1)
Sodium: 134 mmol/L — ABNORMAL LOW (ref 135–145)
Total Bilirubin: 0.7 mg/dL (ref 0.3–1.2)
Total Protein: 6.3 g/dL — ABNORMAL LOW (ref 6.5–8.1)

## 2022-08-06 LAB — CBC WITH DIFFERENTIAL/PLATELET
Abs Immature Granulocytes: 0.03 10*3/uL (ref 0.00–0.07)
Basophils Absolute: 0 10*3/uL (ref 0.0–0.1)
Basophils Relative: 0 %
Eosinophils Absolute: 0 10*3/uL (ref 0.0–0.5)
Eosinophils Relative: 0 %
HCT: 29.4 % — ABNORMAL LOW (ref 36.0–46.0)
Hemoglobin: 9.7 g/dL — ABNORMAL LOW (ref 12.0–15.0)
Immature Granulocytes: 1 %
Lymphocytes Relative: 3 %
Lymphs Abs: 0.1 10*3/uL — ABNORMAL LOW (ref 0.7–4.0)
MCH: 33.3 pg (ref 26.0–34.0)
MCHC: 33 g/dL (ref 30.0–36.0)
MCV: 101 fL — ABNORMAL HIGH (ref 80.0–100.0)
Monocytes Absolute: 0.3 10*3/uL (ref 0.1–1.0)
Monocytes Relative: 5 %
Neutro Abs: 4.9 10*3/uL (ref 1.7–7.7)
Neutrophils Relative %: 91 %
Platelets: 39 10*3/uL — ABNORMAL LOW (ref 150–400)
RBC: 2.91 MIL/uL — ABNORMAL LOW (ref 3.87–5.11)
RDW: 13.9 % (ref 11.5–15.5)
WBC: 5.3 10*3/uL (ref 4.0–10.5)
nRBC: 0.9 % — ABNORMAL HIGH (ref 0.0–0.2)

## 2022-08-06 LAB — MAGNESIUM: Magnesium: 1.7 mg/dL (ref 1.7–2.4)

## 2022-08-06 MED ORDER — SODIUM CHLORIDE 0.9% FLUSH
10.0000 mL | Freq: Once | INTRAVENOUS | Status: AC
  Administered 2022-08-06: 10 mL via INTRAVENOUS

## 2022-08-06 MED ORDER — OXYCODONE HCL 10 MG PO TABS: 10.0000 mg | ORAL_TABLET | Freq: Four times a day (QID) | ORAL | 0 refills | Status: DC | PRN

## 2022-08-06 MED ORDER — HEPARIN SOD (PORK) LOCK FLUSH 100 UNIT/ML IV SOLN
500.0000 [IU] | Freq: Once | INTRAVENOUS | Status: AC
  Administered 2022-08-06: 500 [IU] via INTRAVENOUS

## 2022-08-06 MED ORDER — DARATUMUMAB-HYALURONIDASE-FIHJ 1800-30000 MG-UT/15ML ~~LOC~~ SOLN
1800.0000 mg | Freq: Once | SUBCUTANEOUS | Status: AC
  Administered 2022-08-06: 1800 mg via SUBCUTANEOUS
  Filled 2022-08-06: qty 15

## 2022-08-06 NOTE — Progress Notes (Signed)
  Radiation Oncology         [phone] ________________________________  Name: Alexandra Price MRN: [id number]  Date of Service: 08/06/2022  DOB: 04/08/1946  Post Treatment Telephone Note  Diagnosis:  Multiple Myeloma not having achieved remission  Intent: Curative  Radiation Treatment Dates: 06/07/2022 through 06/20/2022 Site Technique Total Dose (Gy) Dose per Fx (Gy) Completed Fx Beam Energies  Thoracic Spine: Spine_T11 Complex 25/25 2.5 10/10 10X, 15X  (as documented in provider EOT note)   The patient was available for call today.  The patient did note fatigue during radiation but has since improved. The patient did not note skin changes in the field of radiation during therapy. The patient has noticed improvement in pain in the area(s) treated with radiation. The patient is taking dexamethasone. The patient does not have symptoms of  weakness or loss of control of the extremities. The patient does not have symptoms of headache. The patient does not have symptoms of seizure or uncontrolled movement. The patient does not have symptoms of changes in vision. The patient does not have changes in speech. The patient does not have confusion.  The patient is scheduled for ongoing care with Dr. Delton Coombes in medical oncology. The patient was encouraged to call if she develops concerns or questions regarding radiation.  This concludes the interview.   Leandra Kern, LPN

## 2022-08-06 NOTE — Patient Instructions (Signed)
[place] at [facility] Discharge Instructions   You were seen and examined today by Dr. Delton Coombes.  He reviewed the results of your recent scans on your spine. There is a new compression fracture in your thoracic spine (T2). Every thing in the lower part of the spine was stable.      Thank you for choosing [place] at [facility] to provide your oncology and hematology care.  To afford each patient quality time with our provider, please arrive at least 15 minutes before your scheduled appointment time.   If you have a lab appointment with the [facility] please come in thru the Main Entrance and check in at the main information desk.  You need to re-schedule your appointment should you arrive 10 or more minutes late.  We strive to give you quality time with our providers, and arriving late affects you and other patients whose appointments are after yours.  Also, if you no show three or more times for appointments you may be dismissed from the clinic at the providers discretion.     Again, thank you for choosing [facility].  Our hope is that these requests will decrease the amount of time that you wait before being seen by our physicians.       _____________________________________________________________  Should you have questions after your visit to [facility], please contact our office at [phone] and follow the prompts.  Our office hours are 8:00 a.m. and 4:30 p.m. Monday - Friday.  Please note that voicemails left after 4:00 p.m. may not be returned until the following business day.  We are closed weekends and major holidays.  You do have access to a nurse 24-7, just call the main number to the clinic [phone] and do not press any options, hold on the line and a nurse will answer the phone.    For prescription refill requests, have your pharmacy contact our office and allow 72 hours.    Due to  Covid, you will need to wear a mask upon entering the hospital. If you do not have a mask, a mask will be given to you at the Main Entrance upon arrival. For doctor visits, patients may have 1 support person age 74 or older with them. For treatment visits, patients can not have anyone with them due to social distancing guidelines and our immunocompromised population.

## 2022-08-06 NOTE — Patient Instructions (Signed)
[place]  Discharge Instructions: Thank you for choosing [place] to provide your oncology and hematology care.  If you have a lab appointment with the [facility], please come in thru the Main Entrance and check in at the main information desk.  Wear comfortable clothing and clothing appropriate for easy access to any Portacath or PICC line.   We strive to give you quality time with your provider. You may need to reschedule your appointment if you arrive late (15 or more minutes).  Arriving late affects you and other patients whose appointments are after yours.  Also, if you miss three or more appointments without notifying the office, you may be dismissed from the clinic at the provider's discretion.      For prescription refill requests, have your pharmacy contact our office and allow 72 hours for refills to be completed.    Today you received the following chemotherapy and/or immunotherapy agents Daratumumab      To help prevent nausea and vomiting after your treatment, we encourage you to take your nausea medication as directed.  BELOW ARE SYMPTOMS THAT SHOULD BE REPORTED IMMEDIATELY: *FEVER GREATER THAN 100.4 F (38 C) OR HIGHER *CHILLS OR SWEATING *NAUSEA AND VOMITING THAT IS NOT CONTROLLED WITH YOUR NAUSEA MEDICATION *UNUSUAL SHORTNESS OF BREATH *UNUSUAL BRUISING OR BLEEDING *URINARY PROBLEMS (pain or burning when urinating, or frequent urination) *BOWEL PROBLEMS (unusual diarrhea, constipation, pain near the anus) TENDERNESS IN MOUTH AND THROAT WITH OR WITHOUT PRESENCE OF ULCERS (sore throat, sores in mouth, or a toothache) UNUSUAL RASH, SWELLING OR PAIN  UNUSUAL VAGINAL DISCHARGE OR ITCHING   Items with * indicate a potential emergency and should be followed up as soon as possible or go to the Emergency Department if any problems should occur.  Please show the CHEMOTHERAPY ALERT CARD or IMMUNOTHERAPY ALERT CARD at check-in to the  Emergency Department and triage nurse.  Should you have questions after your visit or need to cancel or reschedule your appointment, please contact [facility] [phone]  and follow the prompts.  Office hours are 8:00 a.m. to 4:30 p.m. Monday - Friday. Please note that voicemails left after 4:00 p.m. may not be returned until the following business day.  We are closed weekends and major holidays. You have access to a nurse at all times for urgent questions. Please call the main number to the clinic [phone] and follow the prompts.  For any non-urgent questions, you may also contact your provider using MyChart. We now offer e-Visits for anyone 44 and older to request care online for non-urgent symptoms. For details visit [email or web address].   Also download the MyChart app! Go to the app store, search "MyChart", open the app, select [place], and log in with your MyChart username and password.  Masks are optional in the cancer centers. If you would like for your care team to wear a mask while they are taking care of you, please let them know. You may have one support person who is at least 74 years old accompany you for your appointments.

## 2022-08-06 NOTE — Progress Notes (Signed)
Patient presents today for Daratumumab injection per providers order.  Vital signs within parameters for treatment.  Labs pending.  Patient took her tylenol, Benadryl, and Dexamethasone at home prior to appointment.   Patient stated that she went to the ED the day after her last injection and and was there for 3 days due to severe back pain.  Patient also stated that she had new lesions to her spine according to a CT scan that was done.  MD notified.  Platelets noted to be 38, MD notified.  Message received from Anastasio Champion RN/Dr. Delton Coombes patient okay for treatment.  Daratumumab administration without incident; injection site WNL; see MAR for injection details.  Patient tolerated procedure well and without incident.  No questions or complaints noted at this time.

## 2022-08-06 NOTE — Progress Notes (Signed)
Alexandra Price, [place] [id number]   CLINIC:  Medical Oncology/Hematology  PCP:  [name], MD [address] [place] / [place] [address] [phone]   REASON FOR VISIT:  Follow-up for multiple myeloma  PRIOR THERAPY:  1. KPD x 4 cycles from 04/17/2018 to 08/21/2018. 2. Stem cell transplant on 10/23/2018. 3. Radiation to left and right humerus 25 Gy in 10 fractions from 08/23/2020 to 08/25/2020. 4.  Maintenance Pomalyst until 06/08/2022  CURRENT THERAPY: Darzalex, Velcade and dexamethasone started on 06/11/2022  BRIEF ONCOLOGIC HISTORY:  Oncology History  Multiple myeloma not having achieved remission ([facility])  04/07/2018 Initial Diagnosis   Multiple myeloma not having achieved remission ([place])   06/11/2022 -  Chemotherapy   Patient is on Treatment Plan : MYELOMA RELAPSED / REFRACTORY Daratumumab SQ + Bortezomib + Dexamethasone (DaraVd) q21d / Daratumumab SQ q28d      Multiple myeloma without remission ([place])  04/15/2018 Initial Diagnosis   Multiple myeloma without remission ([facility] [facility])   04/17/2018 - 09/05/2018 Chemotherapy   The patient had dexamethasone (DECADRON) 4 MG tablet, 1 of 1 cycle, Start date: 04/15/2018, End date: 05/26/2018 palonosetron (ALOXI) injection 0.25 mg, 0.25 mg, Intravenous,  Once, 1 of 1 cycle Administration: 0.25 mg (04/17/2018), 0.25 mg (04/24/2018), 0.25 mg (05/01/2018) cyclophosphamide (CYTOXAN) 540 mg in sodium chloride 0.9 % 250 mL chemo infusion, 300 mg/m2 = 540 mg, Intravenous,  Once, 1 of 1 cycle Administration: 540 mg (04/17/2018), 540 mg (04/24/2018), 540 mg (05/01/2018) carfilzomib (KYPROLIS) 36 mg in dextrose 5 % 50 mL chemo infusion, 20 mg/m2 = 36 mg, Intravenous, Once, 5 of 5 cycles Administration: 36 mg (04/17/2018), 36 mg (04/18/2018), 60 mg (04/24/2018), 60 mg (04/25/2018), 60 mg (05/01/2018), 60 mg (05/02/2018), 60 mg (05/15/2018), 60 mg (05/16/2018), 60 mg (06/12/2018), 60 mg (06/13/2018), 60 mg (06/26/2018), 60 mg (06/27/2018), 60  mg (07/03/2018), 60 mg (07/04/2018), 60 mg (07/24/2018), 60 mg (07/25/2018), 60 mg (07/31/2018), 60 mg (08/01/2018), 60 mg (08/08/2018), 60 mg (08/07/2018), 60 mg (08/21/2018), 60 mg (08/22/2018), 60 mg (08/28/2018), 60 mg (08/29/2018), 60 mg (09/04/2018), 60 mg (09/05/2018)  for chemotherapy treatment.    06/11/2022 -  Chemotherapy   Patient is on Treatment Plan : MYELOMA RELAPSED / REFRACTORY Daratumumab SQ + Bortezomib + Dexamethasone (DaraVd) q21d / Daratumumab SQ q28d        CANCER STAGING:  Cancer Staging  No matching staging information was found for the patient.  INTERVAL HISTORY:  [name], a 76 y.o. female, seen for follow-up of multiple myeloma.  She has been to the ER 3 times since Wednesday of last week.  She developed worsening of her mid back pain and also new pain in the right shoulder blade region.  CT renal study was done on 08/02/2022.  This was followed by a CT of the thoracic spine and abdomen and pelvis on 08/04/2022.  She reported hydrocodone did not help.  REVIEW OF SYSTEMS:  Review of Systems  Respiratory:  Positive for shortness of breath.   Musculoskeletal:  Positive for back pain (Worse in the lower back).  Neurological:  Positive for dizziness and numbness (Right shin).  Psychiatric/Behavioral:  Positive for sleep disturbance.   All other systems reviewed and are negative.   PAST MEDICAL/SURGICAL HISTORY:  Past Medical History:  Diagnosis Date   Anxiety    Chronic kidney disease    Depression    Hypertension    Multiple myeloma ([place])    multiple myeloma   Pre-diabetes  Past Surgical History:  Procedure Laterality Date   ABDOMINAL HYSTERECTOMY     total   APPENDECTOMY     BALLOON DILATION N/A 05/28/2022   Procedure: BALLOON DILATION;  Surgeon: [name], DO;  [facility];  Service: Endoscopy;  Laterality: N/A;   BIOPSY  05/28/2022   Procedure: BIOPSY;  Surgeon: [name], DO;  [facility];  Service:  Endoscopy;;   COLONOSCOPY WITH PROPOFOL N/A 12/25/2021   Procedure: COLONOSCOPY WITH PROPOFOL;  Surgeon: [name], DO;  [facility];  Service: Endoscopy;  Laterality: N/A;  2:30pm   ESOPHAGOGASTRODUODENOSCOPY (EGD) WITH PROPOFOL N/A 05/28/2022   Procedure: ESOPHAGOGASTRODUODENOSCOPY (EGD) WITH PROPOFOL;  Surgeon: [name], DO;  [facility];  Service: Endoscopy;  Laterality: N/A;  10:00am, asa 3   LAPAROSCOPIC APPENDECTOMY N/A 05/20/2018   Procedure: APPENDECTOMY LAPAROSCOPIC;  Surgeon: [name], MD;  Location: [facility];  Service: General;  Laterality: N/A;   POLYPECTOMY  12/25/2021   Procedure: POLYPECTOMY;  Surgeon: [name], DO;  [facility];  Service: Endoscopy;;   PORTACATH PLACEMENT Right 04/14/2018   Procedure: INSERTION PORT-A-CATH;  Surgeon: [name], MD;  Location: [facility];  Service: General;  Laterality: Right;    SOCIAL HISTORY:  Social History   Socioeconomic History   Marital status: Legally Separated    Spouse name: Not on file   Number of children: 6   Years of education: Not on file   Highest education level: Not on file  Occupational History    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  Tobacco Use   Smoking status: Never   Smokeless tobacco: Never  Vaping Use   Vaping Use: Never used  Substance and Sexual Activity   Alcohol use: Never   Drug use: Not Currently   Sexual activity: Not Currently  Other Topics Concern   Not on file  Social History Narrative   Not on file   Social Determinants of Health   Financial Resource Strain: Low Risk  (07/06/2020)   Overall Financial Resource Strain (CARDIA)    Difficulty of Paying Living Expenses: Not hard at all  Food Insecurity: No Food Insecurity (07/06/2020)   Hunger Vital Sign    Worried About Running Out of Food in the Last Year: Never true    Ran Out of Food in the Last Year: Never true  Transportation Needs: No Transportation Needs (07/06/2020)   PRAPARE  - Hydrologist (Medical): No    Lack of Transportation (Non-Medical): No  Physical Activity: Inactive (07/06/2020)   Exercise Vital Sign    Days of Exercise per Week: 0 days    Minutes of Exercise per Session: 0 min  Stress: No Stress Concern Present (07/06/2020)   [place]    Feeling of Stress : Not at all  Social Connections: Moderately Isolated (07/06/2020)   Social Connection and Isolation Panel [NHANES]    Frequency of Communication with Friends and Family: More than three times a week    Frequency of Social Gatherings with Friends and Family: Twice a week    Attends Religious Services: 1 to 4 times per year    Active Member of [employer] or Organizations: No    Attends Archivist Meetings: Never    Marital Status: Separated  Intimate Partner Violence: Not At Risk (08/02/2020)   Humiliation, Afraid, Rape, and Kick questionnaire    Fear of Current or  Ex-Partner: No    Emotionally Abused: No    Physically Abused: No    Sexually Abused: No    FAMILY HISTORY:  Family History  Problem Relation Age of Onset   Heart disease Mother    Emphysema Father    Diabetes Sister    Depression Sister    Cancer Brother        liver, lung, and colon. colon cancer at age 55.   Diabetes Brother     CURRENT MEDICATIONS:  Current Outpatient Medications  Medication Sig Dispense Refill   acetaminophen (TYLENOL) 325 MG tablet Take 650 mg by mouth as needed for moderate pain (prior to infusion).     acyclovir (ZOVIRAX) 400 MG tablet Take 1 tablet (400 mg total) by mouth 2 (two) times daily. 60 tablet 6   albuterol (VENTOLIN HFA) 108 (90 Base) MCG/ACT inhaler      ALPRAZolam (XANAX) 0.5 MG tablet TAKE 1 TABLET BY MOUTH DURING THE DAY AS NEEDED FOR ANXIETY AND 2 TABLETS AT BEDTIME AS NEEDED FOR ANXIETY/INSOMNIA 90 tablet 3   azithromycin (ZITHROMAX) 500 MG tablet      Calcium Carb-Cholecalciferol  (CALCIUM 1000 + D PO) Take 1,000 mg by mouth daily.     cetirizine (ZYRTEC) 10 MG tablet Take 10 mg by mouth daily.     clotrimazole-betamethasone (LOTRISONE) cream Apply 1 Application topically 2 (two) times daily as needed (irritation).     dexamethasone (DECADRON) 4 MG tablet Take 4 mg by mouth See admin instructions. 5 tablets 30 min prior to infusion     dexAMETHasone 20 MG TABS Take 1 tablet by mouth as directed. Take 1 tablet 30 minutes prior to infusions 30 tablet 2   diphenhydrAMINE (BENADRYL) 25 MG tablet Take 50 mg by mouth as needed (prior to infusion).     ELIQUIS 2.5 MG TABS tablet Take 2.5 mg by mouth 2 (two) times daily.     escitalopram (LEXAPRO) 20 MG tablet Take 20 mg by mouth daily.     HYDROcodone-acetaminophen (NORCO) 10-325 MG tablet Take 1 tablet by mouth every 6 (six) hours as needed. 120 tablet 0   ibuprofen (ADVIL) 200 MG tablet Take 200 mg by mouth every 6 (six) hours as needed for moderate pain. 30 tablet 0   methocarbamol (ROBAXIN) 500 MG tablet Take 1 tablet (500 mg total) by mouth every 8 (eight) hours as needed for muscle spasms (back pain). 20 tablet 0   montelukast (SINGULAIR) 10 MG tablet TAKE 1 TABLET BY MOUTH AS DIRECTED 30 MINUTES PRIOR TO TREATMENT (Patient taking differently: Take 10 mg by mouth See admin instructions. Take 1 tablet by mouth as directed 30 minutes prior to treatment) 90 tablet 0   Multiple Vitamin (MULTI VITAMIN) TABS Take 1 tablet by mouth daily.     Oxycodone HCl 10 MG TABS Take 1 tablet (10 mg total) by mouth every 6 (six) hours as needed. 120 tablet 0   pantoprazole (PROTONIX) 40 MG tablet Take 1 tablet (40 mg total) by mouth daily. 90 tablet 6   polyethylene glycol (MIRALAX / GLYCOLAX) 17 g packet Take 17 g by mouth daily as needed for moderate constipation. 14 each 0   prochlorperazine (COMPAZINE) 10 MG tablet Take 1 tablet (10 mg total) by mouth every 6 (six) hours as needed for nausea or vomiting. 30 tablet 0   rosuvastatin (CRESTOR)  10 MG tablet Take 10 mg by mouth daily.     zolpidem (AMBIEN) 10 MG tablet Take 1 tablet (10 mg  total) by mouth at bedtime as needed for sleep. 30 tablet 2   No current facility-administered medications for this visit.    ALLERGIES:  Allergies  Allergen Reactions   Ciprofloxacin Anaphylaxis   Amoxicillin Other (See Comments)    unknown   Morphine And Related Nausea And Vomiting   Augmentin [Amoxicillin-Pot Clavulanate] Other (See Comments)    Headache, insomnia    PHYSICAL EXAM:  Performance status (ECOG): 1 - Symptomatic but completely ambulatory  There were no vitals filed for this visit.  Wt Readings from Last 3 Encounters:  08/04/22 181 lb (82.1 kg)  08/03/22 181 lb (82.1 kg)  08/02/22 181 lb 12.8 oz (82.5 kg)   Physical Exam Vitals reviewed.  Constitutional:      Appearance: Normal appearance. She is obese.  Cardiovascular:     Rate and Rhythm: Normal rate and regular rhythm.     Pulses: Normal pulses.     Heart sounds: Normal heart sounds.  Pulmonary:     Effort: Pulmonary effort is normal.     Breath sounds: Normal breath sounds.  Neurological:     General: No focal deficit present.     Mental Status: She is alert and oriented to person, place, and time.  Psychiatric:        Mood and Affect: Mood normal.        Behavior: Behavior normal.      LABORATORY DATA:  I have reviewed the labs as listed.     Latest Ref Rng & Units 08/06/2022    9:58 AM 08/04/2022    1:35 PM 08/04/2022   12:19 AM  CBC  WBC 4.0 - 10.5 K/uL 5.3  3.6  3.9   Hemoglobin 12.0 - 15.0 g/dL 9.7  9.6  9.5   Hematocrit 36.0 - 46.0 % 29.4  29.3  29.0   Platelets 150 - 400 K/uL 39  40  41       Latest Ref Rng & Units 08/06/2022    9:58 AM 08/04/2022    1:35 PM 08/04/2022   12:19 AM  CMP  Glucose 70 - 99 mg/dL 212  180  204   BUN 8 - 23 mg/dL _0 Creatinine 0.44 - 1.00 mg/dL 1.52  1.34  1.55   Sodium 135 - 145 mmol/L 134  135  137   Potassium 3.5 - 5.1 mmol/L 4.0  4.2   3.6   Chloride 98 - 111 mmol/L 98  102  104   CO2 22 - 32 mmol/L _1 Calcium 8.9 - 10.3 mg/dL 8.6  8.5  8.8   Total Protein 6.5 - 8.1 g/dL 6.3  6.3  6.2   Total Bilirubin 0.3 - 1.2 mg/dL 0.7  0.6  0.4   Alkaline Phos 38 - 126 U/L 70  72  74   AST 15 - 41 U/L _2 ALT 0 - 44 U/L _3 DIAGNOSTIC IMAGING:  I have independently reviewed the scans and discussed with the patient. CT Thoracic Spine Wo Contrast  Result Date: 08/04/2022 CLINICAL DATA:  Acute on chronic back pain. History of multiple myeloma EXAM: CT THORACIC AND LUMBAR SPINE WITHOUT CONTRAST TECHNIQUE: Multidetector CT imaging of the thoracic and lumbar spine was performed without contrast. Multiplanar CT image reconstructions were also generated. RADIATION DOSE REDUCTION: This exam was performed according to the departmental dose-optimization program which includes automated exposure  control, adjustment of the mA and/or kV according to patient size and/or use of iterative reconstruction technique. COMPARISON:  , MRI PET-CT 05/24/2022 11/29/2020 FINDINGS: CT THORACIC SPINE FINDINGS Alignment: Exaggerated thoracic kyphosis.  No static listhesis. Vertebrae: Diffuse osseous demineralization. Heterogeneous appearance of the osseous structures with scattered areas of lucency likely secondary to known multiple myeloma. New superior endplate compression fracture of the T2 vertebral body with approximately 30% vertebral body height loss. Chronic mild superior endplate compression fracture of T11, not significantly changed. Focal lytic lesion within the left lamina of T11. Numerous lytic lesions throughout the sternum and manubrium. Paraspinal and other soft tissues: No acute abnormality. Disc levels: Mild multilevel thoracic spondylosis. No findings to suggest foraminal or canal stenosis by CT. CT LUMBAR SPINE FINDINGS Segmentation: 5 lumbar type vertebrae. Alignment: Normal. Vertebrae: Diffuse osseous demineralization.  Heterogeneous appearance of the osseous structures with numerous scattered areas of lucency compatible with known myeloma. Chronic superior endplate compression fracture at L4 is unchanged. No acute fractures Paraspinal and other soft tissues: Hepatic steatosis. Aortic atherosclerosis. No acute findings. Disc levels: Degenerative disc disease most notably at L1-2 and L4-5. Mild lower lumbar facet arthropathy. Suspect mild canal stenosis at L4-5 with probable mild foraminal stenosis bilaterally at L4-5 and on the left at L3-4 and L5-S1. IMPRESSION: 1. New superior endplate compression fracture of the T2 vertebral body with approximately 30% vertebral body height loss. 2. Chronic superior endplate compression fractures of T11 and L4, unchanged. 3. Heterogeneous appearance of the osseous structures with numerous scattered areas of lucency compatible with known multiple myeloma. 4. Hepatic steatosis. 5. Aortic atherosclerosis (ICD10-I70.0). Electronically Signed   By: Davina Poke D.O.   On: 08/04/2022 17:17   CT L-SPINE NO CHARGE  Result Date: 08/04/2022 CLINICAL DATA:  Acute on chronic back pain. History of multiple myeloma EXAM: CT THORACIC AND LUMBAR SPINE WITHOUT CONTRAST TECHNIQUE: Multidetector CT imaging of the thoracic and lumbar spine was performed without contrast. Multiplanar CT image reconstructions were also generated. RADIATION DOSE REDUCTION: This exam was performed according to the departmental dose-optimization program which includes automated exposure control, adjustment of the mA and/or kV according to patient size and/or use of iterative reconstruction technique. COMPARISON:  , MRI PET-CT 05/24/2022 11/29/2020 FINDINGS: CT THORACIC SPINE FINDINGS Alignment: Exaggerated thoracic kyphosis.  No static listhesis. Vertebrae: Diffuse osseous demineralization. Heterogeneous appearance of the osseous structures with scattered areas of lucency likely secondary to known multiple myeloma. New superior  endplate compression fracture of the T2 vertebral body with approximately 30% vertebral body height loss. Chronic mild superior endplate compression fracture of T11, not significantly changed. Focal lytic lesion within the left lamina of T11. Numerous lytic lesions throughout the sternum and manubrium. Paraspinal and other soft tissues: No acute abnormality. Disc levels: Mild multilevel thoracic spondylosis. No findings to suggest foraminal or canal stenosis by CT. CT LUMBAR SPINE FINDINGS Segmentation: 5 lumbar type vertebrae. Alignment: Normal. Vertebrae: Diffuse osseous demineralization. Heterogeneous appearance of the osseous structures with numerous scattered areas of lucency compatible with known myeloma. Chronic superior endplate compression fracture at L4 is unchanged. No acute fractures Paraspinal and other soft tissues: Hepatic steatosis. Aortic atherosclerosis. No acute findings. Disc levels: Degenerative disc disease most notably at L1-2 and L4-5. Mild lower lumbar facet arthropathy. Suspect mild canal stenosis at L4-5 with probable mild foraminal stenosis bilaterally at L4-5 and on the left at L3-4 and L5-S1. IMPRESSION: 1. New superior endplate compression fracture of the T2 vertebral body with approximately 30% vertebral body height loss. 2. Chronic  superior endplate compression fractures of T11 and L4, unchanged. 3. Heterogeneous appearance of the osseous structures with numerous scattered areas of lucency compatible with known multiple myeloma. 4. Hepatic steatosis. 5. Aortic atherosclerosis (ICD10-I70.0). Electronically Signed   By: Davina Poke D.O.   On: 08/04/2022 17:17   CT Abdomen Pelvis W Contrast  Result Date: 08/04/2022 CLINICAL DATA:  Acute abdominal pain. Multiple myeloma. * Tracking Code: BO * EXAM: CT ABDOMEN AND PELVIS WITH CONTRAST TECHNIQUE: Multidetector CT imaging of the abdomen and pelvis was performed using the standard protocol following bolus administration of  intravenous contrast. RADIATION DOSE REDUCTION: This exam was performed according to the departmental dose-optimization program which includes automated exposure control, adjustment of the mA and/or kV according to patient size and/or use of iterative reconstruction technique. CONTRAST:  38m OMNIPAQUE IOHEXOL 300 MG/ML  SOLN COMPARISON:  Noncontrast CT on 08/02/2022 FINDINGS: Lower Chest: No acute findings. Hepatobiliary: No hepatic masses identified. Moderate diffuse hepatic steatosis is demonstrated. Gallbladder is unremarkable. No evidence of biliary ductal dilatation. Pancreas:  No mass or inflammatory changes. Spleen: Within normal limits in size and appearance. Adrenals/Urinary Tract: No suspicious masses identified. No evidence of ureteral calculi or hydronephrosis. Stomach/Bowel: No evidence of obstruction, inflammatory process or abnormal fluid collections. Vascular/Lymphatic: No pathologically enlarged lymph nodes. No acute vascular findings. Aortic atherosclerotic calcification incidentally noted. Reproductive: Prior hysterectomy noted. Adnexal regions are unremarkable in appearance. Other:  None. Musculoskeletal: Diffuse permeative osteopenia is again demonstrated, consistent with patient's known history of multiple myeloma. Multiple old left posterior rib fracture deformities are again seen. Old compression fracture deformities of the T11 and L4 vertebral bodies show no significant change. IMPRESSION: No acute findings within the abdomen or pelvis. Moderate hepatic steatosis. Diffuse permeative osteopenia, consistent with patient's known history of multiple myeloma. No acute fractures identified. Stable old compression deformities of T11 and L4 vertebral bodies, and old left posterior rib fractures. Aortic Atherosclerosis (ICD10-I70.0). Electronically Signed   By: JMarlaine HindM.D.   On: 08/04/2022 15:06   CT Renal Stone Study  Result Date: 08/02/2022 CLINICAL DATA:  Right-sided flank pain for  several hours EXAM: CT ABDOMEN AND PELVIS WITHOUT CONTRAST TECHNIQUE: Multidetector CT imaging of the abdomen and pelvis was performed following the standard protocol without IV contrast. RADIATION DOSE REDUCTION: This exam was performed according to the departmental dose-optimization program which includes automated exposure control, adjustment of the mA and/or kV according to patient size and/or use of iterative reconstruction technique. COMPARISON:  05/24/2022 PET-CT FINDINGS: Lower chest: Calcified granuloma is noted in the right middle lobe. No focal infiltrate is seen. Hepatobiliary: Fatty infiltration of the liver is noted. Gallbladder is within normal limits. Pancreas: Unremarkable. No pancreatic ductal dilatation or surrounding inflammatory changes. Spleen: Normal in size without focal abnormality. Adrenals/Urinary Tract: Adrenal glands are within normal limits. Kidneys are well visualized bilaterally. No renal calculi or obstructive changes are seen. The ureters are within normal limits. The bladder is decompressed. Stomach/Bowel: Fecal material is noted throughout the colon consistent with a mild degree of constipation. The appendix has been surgically removed. Small bowel and stomach are within normal limits. Vascular/Lymphatic: Aortic atherosclerosis. No enlarged abdominal or pelvic lymph nodes. Reproductive: Status post hysterectomy. No adnexal masses. Other: No abdominal wall hernia or abnormality. No abdominopelvic ascites. Musculoskeletal: Multiple lytic lesions are noted scattered throughout the bony structures consistent with the given clinical history of myeloma. IMPRESSION: No renal calculi or obstructive changes are noted. Mild colonic constipation is noted. Changes of multiple myeloma. Electronically Signed  By: Inez Catalina M.D.   On: 08/02/2022 02:45     ASSESSMENT:  1.  IgG lambda plasma cell myeloma, stage II, standard risk: -4 cycles of KPD from 04/17/2018 through 08/21/2018, stem cell  transplant on 10/23/2018. -PET scan on 01/21/2019 showed multiple bone lesions but without any hypermetabolic activity. -BMBX on 01/21/2019 with normocellular marrow with no increase in plasma cells.  Normal FISH.  MRD results negative. -Maintenance pomalidomide 2 mg 3 weeks on/1 week off started on 03/10/2019. -Bone marrow biopsy on 10/29/2019 shows trilineage hematopoiesis with no evidence of plasma cells.  Chromosome analysis and FISH are normal. -BM BX on 10/27/2020 at [place] with 30 to 40%, TLH.  2% of the total cells are CD 138+ plasma cells. - Pomalyst was held due to dizziness from 03/29/2021 through 04/26/2021. - MRI of the brain on 03/31/2021 did not show any evidence of intracranial lesions.  Multiple bone lesions in the skull compatible with sequela of myeloma.  - Labs at [facility] on 11/16/2021: M spike 0.24 g.  Immunofixation positive for IgG lambda. - PET scan on 12/14/2021: No evidence of FDG avid osseous or soft tissue myeloma. - She has developed left shoulder blade and right posterior rib pain for the last 3 to 4 months. - 24-hour urine on 12/14/2021: Total protein 136 mg.  Immune immunofixation positive for lambda type Bence-Jones protein. -- PET scan (05/24/2022): Single new hypermetabolic lytic lesion involving left lamina of T11.  No canal encroachment.  No other hypermetabolic bone lesions.  Stable diffuse lytic lesions throughout the axial and appendicular skeleton.  No soft tissue lesions. - Maintenance Pomalyst discontinued on 05/29/2022 - XRT to the T11 completed on 06/20/2022 - Daratumumab, Velcade and dexamethasone started on 06/11/2022  2.  Pulmonary embolism: -CT angiogram on 04/21/2020 showed filling defect at [facility]. -VQ scan confirmed pulmonary embolism. -She is on Eliquis.   3.  Right shoulder and upper arm pain: - She received XRT to the chest from 12/26/2020 through 01/06/2021.   PLAN:  1.  IgG lambda plasma cell myeloma: - She is  tolerating Darzalex and Velcade very well.  She is taking dexamethasone 20 mg once weekly on Mondays. - I have reviewed her labs today which showed creatinine 1.52 and rest of LFTs normal.  Calcium was normal.  Platelet count is 39 and hemoglobin 9.7. - She will proceed with her Darzalex and Velcade.  We have sent myeloma labs today.  She will follow-up with [place] next week to discuss results.   2.  Back pain/left shoulder blade pain/right lateral and posterior rib pains: - She was taking hydrocodone 10 mg every 6 hours.  XRT to the T11 lamina completed on 06/20/2022. - I have reviewed scans from ER visits.  CT scan of the thoracic and lumbar spine showed new superior endplate compression fracture of T2 vertebral body with 30% height loss.  Chronic superior endplate compression fractures of T11 and L4 unchanged.  Numerous lucent bone lesions compatible with multiple myeloma. - Today she took hydrocodone 10 mg in the morning and the pain is better controlled. - I will give her oxycodone 10 mg every 6 hours as needed to be taken for severe pain.   3.  Myeloma bone disease: - Denosumab was held due to exposure of bone in the right lower jaw.  Last dose was in 2020.   4.  Anxiety: - Continue Xanax twice daily as needed.   5.  Sleeping difficulty: - Continue Ambien  at bedtime as needed.   6.  Macrocytic anemia: -Combination anemia from CKD and myelosuppression.  Hemoglobin today is 9.7.   7.  Pulmonary embolism: - Continue Eliquis.  No bleeding issues.   Orders placed this encounter:  No orders of the defined types were placed in this encounter.      [name], MD [place] [phone]

## 2022-08-07 ENCOUNTER — Other Ambulatory Visit: Payer: Self-pay

## 2022-08-08 ENCOUNTER — Other Ambulatory Visit: Payer: Self-pay | Admitting: Hematology

## 2022-08-08 LAB — PROTEIN ELECTROPHORESIS, SERUM
A/G Ratio: 1.2 (ref 0.7–1.7)
Albumin ELP: 3.2 g/dL (ref 2.9–4.4)
Alpha-1-Globulin: 0.3 g/dL (ref 0.0–0.4)
Alpha-2-Globulin: 0.8 g/dL (ref 0.4–1.0)
Beta Globulin: 0.7 g/dL (ref 0.7–1.3)
Gamma Globulin: 0.8 g/dL (ref 0.4–1.8)
Globulin, Total: 2.6 g/dL (ref 2.2–3.9)
M-Spike, %: 0.3 g/dL — ABNORMAL HIGH
Total Protein ELP: 5.8 g/dL — ABNORMAL LOW (ref 6.0–8.5)

## 2022-08-08 LAB — KAPPA/LAMBDA LIGHT CHAINS
Kappa free light chain: 4 mg/L (ref 3.3–19.4)
Kappa, lambda light chain ratio: 0.19 — ABNORMAL LOW (ref 0.26–1.65)
Lambda free light chains: 21.5 mg/L (ref 5.7–26.3)

## 2022-08-09 ENCOUNTER — Ambulatory Visit: Payer: Medicare Other

## 2022-08-09 ENCOUNTER — Other Ambulatory Visit: Payer: Self-pay | Admitting: Hematology

## 2022-08-09 MED ORDER — OXYCODONE HCL 5 MG PO TABS: 10.0000 mg | ORAL_TABLET | Freq: Four times a day (QID) | ORAL | 0 refills | Status: DC | PRN

## 2022-08-14 ENCOUNTER — Emergency Department (HOSPITAL_COMMUNITY): Payer: Medicare Other

## 2022-08-14 ENCOUNTER — Other Ambulatory Visit: Payer: Self-pay

## 2022-08-14 ENCOUNTER — Inpatient Hospital Stay: Payer: Medicare Other

## 2022-08-14 ENCOUNTER — Encounter (HOSPITAL_COMMUNITY): Payer: Self-pay | Admitting: *Deleted

## 2022-08-14 ENCOUNTER — Inpatient Hospital Stay (HOSPITAL_COMMUNITY)
Admission: EM | Admit: 2022-08-14 | Discharge: 2022-08-17 | DRG: 189 | Disposition: A | Discharge status: Home / Self Care | Payer: Medicare Other | Attending: Family Medicine | Admitting: Family Medicine

## 2022-08-14 ENCOUNTER — Inpatient Hospital Stay: Payer: Medicare Other | Admitting: Hematology

## 2022-08-14 ENCOUNTER — Telehealth: Payer: Self-pay | Admitting: *Deleted

## 2022-08-14 DIAGNOSIS — Z1152 Encounter for screening for COVID-19: Secondary | ICD-10-CM

## 2022-08-14 DIAGNOSIS — I10 Essential (primary) hypertension: Secondary | ICD-10-CM | POA: Diagnosis present

## 2022-08-14 DIAGNOSIS — Z881 Allergy status to other antibiotic agents status: Secondary | ICD-10-CM

## 2022-08-14 DIAGNOSIS — Z800 Family history of malignant neoplasm of digestive organs: Secondary | ICD-10-CM

## 2022-08-14 DIAGNOSIS — N184 Chronic kidney disease, stage 4 (severe): Secondary | ICD-10-CM | POA: Diagnosis present

## 2022-08-14 DIAGNOSIS — Z825 Family history of asthma and other chronic lower respiratory diseases: Secondary | ICD-10-CM

## 2022-08-14 DIAGNOSIS — Z86711 Personal history of pulmonary embolism: Secondary | ICD-10-CM

## 2022-08-14 DIAGNOSIS — Z7952 Long term (current) use of systemic steroids: Secondary | ICD-10-CM

## 2022-08-14 DIAGNOSIS — D630 Anemia in neoplastic disease: Secondary | ICD-10-CM | POA: Diagnosis present

## 2022-08-14 DIAGNOSIS — J209 Acute bronchitis, unspecified: Secondary | ICD-10-CM | POA: Diagnosis not present

## 2022-08-14 DIAGNOSIS — J9601 Acute respiratory failure with hypoxia: Secondary | ICD-10-CM | POA: Diagnosis not present

## 2022-08-14 DIAGNOSIS — J205 Acute bronchitis due to respiratory syncytial virus: Secondary | ICD-10-CM | POA: Diagnosis not present

## 2022-08-14 DIAGNOSIS — Z7960 Long term (current) use of unspecified immunomodulators and immunosuppressants: Secondary | ICD-10-CM

## 2022-08-14 DIAGNOSIS — Z79899 Other long term (current) drug therapy: Secondary | ICD-10-CM

## 2022-08-14 DIAGNOSIS — N1832 Chronic kidney disease, stage 3b: Secondary | ICD-10-CM

## 2022-08-14 DIAGNOSIS — I2699 Other pulmonary embolism without acute cor pulmonale: Secondary | ICD-10-CM | POA: Diagnosis present

## 2022-08-14 DIAGNOSIS — Z8249 Family history of ischemic heart disease and other diseases of the circulatory system: Secondary | ICD-10-CM

## 2022-08-14 DIAGNOSIS — D631 Anemia in chronic kidney disease: Secondary | ICD-10-CM | POA: Diagnosis present

## 2022-08-14 DIAGNOSIS — E1165 Type 2 diabetes mellitus with hyperglycemia: Secondary | ICD-10-CM | POA: Diagnosis present

## 2022-08-14 DIAGNOSIS — I129 Hypertensive chronic kidney disease with stage 1 through stage 4 chronic kidney disease, or unspecified chronic kidney disease: Secondary | ICD-10-CM | POA: Diagnosis present

## 2022-08-14 DIAGNOSIS — Z833 Family history of diabetes mellitus: Secondary | ICD-10-CM

## 2022-08-14 DIAGNOSIS — Z885 Allergy status to narcotic agent status: Secondary | ICD-10-CM

## 2022-08-14 DIAGNOSIS — R079 Chest pain, unspecified: Secondary | ICD-10-CM | POA: Diagnosis present

## 2022-08-14 DIAGNOSIS — C9000 Multiple myeloma not having achieved remission: Secondary | ICD-10-CM | POA: Diagnosis present

## 2022-08-14 DIAGNOSIS — Z90710 Acquired absence of both cervix and uterus: Secondary | ICD-10-CM

## 2022-08-14 DIAGNOSIS — Z7901 Long term (current) use of anticoagulants: Secondary | ICD-10-CM

## 2022-08-14 DIAGNOSIS — J210 Acute bronchiolitis due to respiratory syncytial virus: Secondary | ICD-10-CM | POA: Diagnosis present

## 2022-08-14 LAB — CBC WITH DIFFERENTIAL/PLATELET
Abs Immature Granulocytes: 0.02 10*3/uL (ref 0.00–0.07)
Basophils Absolute: 0 10*3/uL (ref 0.0–0.1)
Basophils Relative: 0 %
Eosinophils Absolute: 0 10*3/uL (ref 0.0–0.5)
Eosinophils Relative: 0 %
HCT: 31.1 % — ABNORMAL LOW (ref 36.0–46.0)
Hemoglobin: 9.7 g/dL — ABNORMAL LOW (ref 12.0–15.0)
Immature Granulocytes: 0 %
Lymphocytes Relative: 6 %
Lymphs Abs: 0.3 10*3/uL — ABNORMAL LOW (ref 0.7–4.0)
MCH: 33 pg (ref 26.0–34.0)
MCHC: 31.2 g/dL (ref 30.0–36.0)
MCV: 105.8 fL — ABNORMAL HIGH (ref 80.0–100.0)
Monocytes Absolute: 0.5 10*3/uL (ref 0.1–1.0)
Monocytes Relative: 9 %
Neutro Abs: 4.2 10*3/uL (ref 1.7–7.7)
Neutrophils Relative %: 85 %
Platelets: 103 10*3/uL — ABNORMAL LOW (ref 150–400)
RBC: 2.94 MIL/uL — ABNORMAL LOW (ref 3.87–5.11)
RDW: 14.2 % (ref 11.5–15.5)
WBC: 5 10*3/uL (ref 4.0–10.5)
nRBC: 0 % (ref 0.0–0.2)

## 2022-08-14 LAB — COMPREHENSIVE METABOLIC PANEL
ALT: 30 U/L (ref 0–44)
AST: 26 U/L (ref 15–41)
Albumin: 3.8 g/dL (ref 3.5–5.0)
Alkaline Phosphatase: 79 U/L (ref 38–126)
Anion gap: 11 (ref 5–15)
BUN: 25 mg/dL — ABNORMAL HIGH (ref 8–23)
CO2: 27 mmol/L (ref 22–32)
Calcium: 8.4 mg/dL — ABNORMAL LOW (ref 8.9–10.3)
Chloride: 97 mmol/L — ABNORMAL LOW (ref 98–111)
Creatinine, Ser: 1.63 mg/dL — ABNORMAL HIGH (ref 0.44–1.00)
GFR, Estimated: 33 mL/min — ABNORMAL LOW (ref >60)
Glucose, Bld: 149 mg/dL — ABNORMAL HIGH (ref 70–99)
Potassium: 4 mmol/L (ref 3.5–5.1)
Sodium: 135 mmol/L (ref 135–145)
Total Bilirubin: 0.5 mg/dL (ref 0.3–1.2)
Total Protein: 7 g/dL (ref 6.5–8.1)

## 2022-08-14 LAB — RESP PANEL BY RT-PCR (RSV, FLU A&B, COVID)  RVPGX2
Influenza A by PCR: NEGATIVE
Influenza B by PCR: NEGATIVE
Resp Syncytial Virus by PCR: POSITIVE — AB
SARS Coronavirus 2 by RT PCR: NEGATIVE

## 2022-08-14 LAB — TROPONIN I (HIGH SENSITIVITY)
Troponin I (High Sensitivity): 9 ng/L (ref <18)
Troponin I (High Sensitivity): 9 ng/L (ref <18)

## 2022-08-14 MED ORDER — ONDANSETRON HCL 4 MG/2ML IJ SOLN
4.0000 mg | Freq: Once | INTRAMUSCULAR | Status: AC
  Administered 2022-08-14: 4 mg via INTRAVENOUS
  Filled 2022-08-14: qty 2

## 2022-08-14 MED ORDER — ACYCLOVIR 800 MG PO TABS
400.0000 mg | ORAL_TABLET | Freq: Two times a day (BID) | ORAL | Status: DC
  Administered 2022-08-14 – 2022-08-17 (×6): 400 mg via ORAL
  Filled 2022-08-14 (×6): qty 1

## 2022-08-14 MED ORDER — FENTANYL CITRATE PF 50 MCG/ML IJ SOSY
50.0000 ug | PREFILLED_SYRINGE | Freq: Once | INTRAMUSCULAR | Status: AC
  Administered 2022-08-14: 50 ug via INTRAVENOUS
  Filled 2022-08-14: qty 1

## 2022-08-14 MED ORDER — ALBUTEROL SULFATE (2.5 MG/3ML) 0.083% IN NEBU
2.5000 mg | INHALATION_SOLUTION | Freq: Four times a day (QID) | RESPIRATORY_TRACT | Status: AC
  Administered 2022-08-14 – 2022-08-15 (×3): 2.5 mg via RESPIRATORY_TRACT
  Filled 2022-08-14 (×3): qty 3

## 2022-08-14 MED ORDER — ALPRAZOLAM 0.5 MG PO TABS
0.5000 mg | ORAL_TABLET | Freq: Every evening | ORAL | Status: DC | PRN
  Administered 2022-08-14 – 2022-08-16 (×3): 0.5 mg via ORAL
  Filled 2022-08-14 (×3): qty 1

## 2022-08-14 MED ORDER — SODIUM CHLORIDE 0.9 % IV SOLN: INTRAVENOUS | Status: AC

## 2022-08-14 MED ORDER — APIXABAN 2.5 MG PO TABS
2.5000 mg | ORAL_TABLET | Freq: Two times a day (BID) | ORAL | Status: DC
  Administered 2022-08-14 – 2022-08-17 (×6): 2.5 mg via ORAL
  Filled 2022-08-14 (×6): qty 1

## 2022-08-14 MED ORDER — IPRATROPIUM-ALBUTEROL 0.5-2.5 (3) MG/3ML IN SOLN
3.0000 mL | Freq: Once | RESPIRATORY_TRACT | Status: AC
  Administered 2022-08-14: 3 mL via RESPIRATORY_TRACT
  Filled 2022-08-14: qty 3

## 2022-08-14 MED ORDER — METHYLPREDNISOLONE SODIUM SUCC 125 MG IJ SOLR
125.0000 mg | Freq: Once | INTRAMUSCULAR | Status: AC
  Administered 2022-08-14: 125 mg via INTRAVENOUS
  Filled 2022-08-14: qty 2

## 2022-08-14 MED ORDER — ZOLPIDEM TARTRATE 5 MG PO TABS
5.0000 mg | ORAL_TABLET | Freq: Every evening | ORAL | Status: DC | PRN
  Administered 2022-08-14 – 2022-08-16 (×3): 5 mg via ORAL
  Filled 2022-08-14 (×3): qty 1

## 2022-08-14 MED ORDER — GUAIFENESIN-DM 100-10 MG/5ML PO SYRP
15.0000 mL | ORAL_SOLUTION | Freq: Three times a day (TID) | ORAL | Status: AC
  Administered 2022-08-14 – 2022-08-15 (×3): 15 mL via ORAL
  Filled 2022-08-14 (×3): qty 15

## 2022-08-14 MED ORDER — ROSUVASTATIN CALCIUM 20 MG PO TABS
10.0000 mg | ORAL_TABLET | Freq: Every day | ORAL | Status: DC
  Administered 2022-08-15 – 2022-08-17 (×3): 10 mg via ORAL
  Filled 2022-08-14 (×3): qty 1

## 2022-08-14 MED ORDER — ONDANSETRON HCL 4 MG/2ML IJ SOLN: 4.0000 mg | Freq: Four times a day (QID) | INTRAMUSCULAR | Status: DC | PRN

## 2022-08-14 MED ORDER — HYDROCODONE-ACETAMINOPHEN 10-325 MG PO TABS
1.0000 | ORAL_TABLET | Freq: Four times a day (QID) | ORAL | Status: DC | PRN
  Administered 2022-08-15 – 2022-08-17 (×6): 1 via ORAL
  Filled 2022-08-14 (×7): qty 1

## 2022-08-14 MED ORDER — ONDANSETRON HCL 4 MG PO TABS: 4.0000 mg | ORAL_TABLET | Freq: Four times a day (QID) | ORAL | Status: DC | PRN

## 2022-08-14 MED ORDER — ACETAMINOPHEN 650 MG RE SUPP: 650.0000 mg | Freq: Four times a day (QID) | RECTAL | Status: DC | PRN

## 2022-08-14 MED ORDER — METHYLPREDNISOLONE SODIUM SUCC 125 MG IJ SOLR
60.0000 mg | Freq: Two times a day (BID) | INTRAMUSCULAR | Status: DC
  Administered 2022-08-14: 60 mg via INTRAVENOUS
  Filled 2022-08-14: qty 2

## 2022-08-14 MED ORDER — SODIUM CHLORIDE 0.9 % IV BOLUS
500.0000 mL | Freq: Once | INTRAVENOUS | Status: AC
  Administered 2022-08-14: 500 mL via INTRAVENOUS

## 2022-08-14 MED ORDER — ALBUTEROL SULFATE (2.5 MG/3ML) 0.083% IN NEBU: 2.5000 mg | INHALATION_SOLUTION | RESPIRATORY_TRACT | Status: DC | PRN

## 2022-08-14 MED ORDER — ACETAMINOPHEN 325 MG PO TABS
650.0000 mg | ORAL_TABLET | Freq: Four times a day (QID) | ORAL | Status: DC | PRN
  Administered 2022-08-16: 650 mg via ORAL
  Filled 2022-08-14 (×2): qty 2

## 2022-08-14 MED ORDER — ESCITALOPRAM OXALATE 10 MG PO TABS
20.0000 mg | ORAL_TABLET | Freq: Every day | ORAL | Status: DC
  Administered 2022-08-15 – 2022-08-17 (×3): 20 mg via ORAL
  Filled 2022-08-14 (×3): qty 2

## 2022-08-14 MED ORDER — PANTOPRAZOLE SODIUM 40 MG PO TBEC
40.0000 mg | DELAYED_RELEASE_TABLET | Freq: Every day | ORAL | Status: DC
  Administered 2022-08-15 – 2022-08-17 (×3): 40 mg via ORAL
  Filled 2022-08-14 (×3): qty 1

## 2022-08-14 MED ORDER — ALBUTEROL SULFATE HFA 108 (90 BASE) MCG/ACT IN AERS: 2.0000 | INHALATION_SPRAY | RESPIRATORY_TRACT | Status: DC | PRN

## 2022-08-14 NOTE — ED Provider Notes (Addendum)
[facility] EMERGENCY DEPARTMENT Provider Note   CSN: [id number] Arrival date & time: 08/14/22  1256     History  Chief Complaint  Patient presents with   Cough    Alexandra Price is a 76 y.o. female.  Pt is a 76 yo female with a pmhx significant for depression, anxiety, multiple myeloma, and CKD.  Pt said she's had cough and sob for the past few days.  Pt was seen by me last week for worsening of her multiple myeloma bony pain.  That pain has improved with pain meds, but she now has pain to the right side of her chest.  Pt has not had fever.  She is getting chemo for her MM and last received it on 12/18.    Pt's O2 sat dropped to 68% when she was walked back to her room.  Nurse put her on 2L and O2 sats increased to the 90s.       Home Medications Prior to Admission medications   Medication Sig Start Date End Date Taking? Authorizing Provider  acetaminophen (TYLENOL) 325 MG tablet Take 650 mg by mouth as needed for moderate pain (prior to infusion).   Yes [provider]  acyclovir (ZOVIRAX) 400 MG tablet Take 1 tablet (400 mg total) by mouth 2 (two) times daily. 06/22/22  Yes Pennington, Rebekah M, [name]  ALPRAZolam (XANAX) 0.5 MG tablet TAKE 1 TABLET BY MOUTH DURING THE DAY AS NEEDED FOR ANXIETY AND 2 TABLETS AT BEDTIME AS NEEDED FOR ANXIETY/INSOMNIA 06/26/22  Yes [name], MD  Calcium Carb-Cholecalciferol (CALCIUM 1000 + D PO) Take 1,000 mg by mouth daily.   Yes [provider]  cetirizine (ZYRTEC) 10 MG tablet Take 10 mg by mouth daily. 07/19/22  Yes [provider]  dexamethasone (DECADRON) 4 MG tablet Take 4 mg by mouth See admin instructions. 5 tablets 30 min prior to infusion   Yes [provider]  diphenhydrAMINE (BENADRYL) 25 MG tablet Take 50 mg by mouth as needed (prior to infusion).   Yes [provider]  ELIQUIS 2.5 MG TABS tablet Take 2.5 mg by mouth 2 (two) times daily. 06/14/22  Yes [provider]   escitalopram (LEXAPRO) 20 MG tablet Take 20 mg by mouth daily. 12/18/21  Yes [provider]  HYDROcodone-acetaminophen (NORCO) 10-325 MG tablet Take 1 tablet by mouth every 6 (six) hours as needed. 07/25/22  Yes [name], MD  ibuprofen (ADVIL) 200 MG tablet Take 200 mg by mouth every 6 (six) hours as needed for moderate pain. 04/30/22  Yes Mahala Menghini, [name]  montelukast (SINGULAIR) 10 MG tablet TAKE 1 TABLET BY MOUTH AS DIRECTED 30 MINUTES PRIOR TO TREATMENT Patient taking differently: Take 10 mg by mouth See admin instructions. Take 1 tablet by mouth as directed 30 minutes prior to treatment 06/21/22  Yes [name], MD  Multiple Vitamin (MULTI VITAMIN) TABS Take 1 tablet by mouth daily.   Yes [provider]  oxyCODONE (ROXICODONE) 5 MG immediate release tablet Take 2 tablets (10 mg total) by mouth every 6 (six) hours as needed for severe pain. Patient not taking: Reported on 08/14/2022 08/09/22   [name], MD  Oxycodone HCl 10 MG TABS Take 1 tablet (10 mg total) by mouth every 6 (six) hours as needed. 08/06/22  Yes [name], MD  pantoprazole (PROTONIX) 40 MG tablet Take 1 tablet (40 mg total) by mouth daily. 02/12/22  Yes [name], MD  polyethylene glycol (MIRALAX / GLYCOLAX) 17 g  packet Take 17 g by mouth daily as needed for moderate constipation. 08/04/22  Yes Pollina, Gwenyth Allegra, MD  prochlorperazine (COMPAZINE) 10 MG tablet Take 1 tablet (10 mg total) by mouth every 6 (six) hours as needed for nausea or vomiting. 06/21/22  Yes [name], MD  rosuvastatin (CRESTOR) 10 MG tablet Take 10 mg by mouth daily. 05/30/20  Yes [provider]  zolpidem (AMBIEN) 10 MG tablet Take 1 tablet (10 mg total) by mouth at bedtime as needed for sleep. 05/23/22  Yes [name], MD  albuterol (VENTOLIN HFA) 108 (90 Base) MCG/ACT inhaler  07/19/22   [provider]  azithromycin (ZITHROMAX) 500 MG  tablet  07/19/22   [provider]  dexAMETHasone 20 MG TABS Take 1 tablet by mouth as directed. Take 1 tablet 30 minutes prior to infusions Patient not taking: Reported on 08/14/2022 05/30/22   [name], MD  methocarbamol (ROBAXIN) 500 MG tablet Take 1 tablet (500 mg total) by mouth every 8 (eight) hours as needed for muscle spasms (back pain). 08/02/22   Mesner, Corene Cornea, MD      Allergies    Ciprofloxacin, Amoxicillin, Morphine and related, and Augmentin [amoxicillin-pot clavulanate]    Review of Systems   Review of Systems  Respiratory:  Positive for cough, shortness of breath and wheezing.   All other systems reviewed and are negative.   Physical Exam Updated Vital Signs BP (!) 174/91   Pulse (!) 105   Temp 99.3 F (37.4 C) (Oral)   Resp (!) 30   Ht _0  (1.651 m)   Wt 81.6 kg   SpO2 93%   BMI 29.95 kg/m  Physical Exam Vitals and nursing note reviewed.  Constitutional:      Appearance: Normal appearance.  HENT:     Head: Normocephalic and atraumatic.     Right Ear: External ear normal.     Left Ear: External ear normal.     Nose: Nose normal.     Mouth/Throat:     Mouth: Mucous membranes are dry.  Eyes:     Extraocular Movements: Extraocular movements intact.     Conjunctiva/sclera: Conjunctivae normal.     Pupils: Pupils are equal, round, and reactive to light.  Cardiovascular:     Rate and Rhythm: Regular rhythm. Tachycardia present.     Pulses: Normal pulses.     Heart sounds: Normal heart sounds.  Pulmonary:     Effort: Respiratory distress present.  Abdominal:     General: Abdomen is flat. Bowel sounds are normal.     Palpations: Abdomen is soft.  Musculoskeletal:        General: Normal range of motion.     Cervical back: Normal range of motion and neck supple.  Skin:    General: Skin is warm.     Capillary Refill: Capillary refill takes less than 2 seconds.  Neurological:     General: No focal deficit present.     Mental Status:  She is alert and oriented to person, place, and time.  Psychiatric:        Mood and Affect: Mood normal.        Behavior: Behavior normal.     ED Results / Procedures / Treatments   Labs (all labs ordered are listed, but only abnormal results are displayed) Labs Reviewed  RESP PANEL BY RT-PCR (RSV, FLU A&B, COVID)  RVPGX2 - Abnormal; Notable for the following components:      Result Value   Resp Syncytial Virus  by PCR POSITIVE (*)    All other components within normal limits  CBC WITH DIFFERENTIAL/PLATELET - Abnormal; Notable for the following components:   RBC 2.94 (*)    Hemoglobin 9.7 (*)    HCT 31.1 (*)    MCV 105.8 (*)    Platelets 103 (*)    Lymphs Abs 0.3 (*)    All other components within normal limits  COMPREHENSIVE METABOLIC PANEL - Abnormal; Notable for the following components:   Chloride 97 (*)    Glucose, Bld 149 (*)    BUN 25 (*)    Creatinine, Ser 1.63 (*)    Calcium 8.4 (*)    GFR, Estimated 33 (*)    All other components within normal limits    EKG EKG Interpretation  Date/Time:  Tuesday August 14 2022 13:33:51 EST Ventricular Rate:  94 PR Interval:  140 QRS Duration: 82 QT Interval:  368 QTC Calculation: 460 R Axis:   75 Text Interpretation: Normal sinus rhythm Normal ECG When compared with ECG of 25-May-2022 10:55, Vent. rate has increased BY  42 BPM Since last tracing rate faster Confirmed by Isla Pence [phone]) on 08/14/2022 4:46:15 PM  Radiology DG Chest 2 View  Result Date: 08/14/2022 CLINICAL DATA:  Two-week history of productive cough, congestion, and chills EXAM: CHEST - 2 VIEW COMPARISON:  Radiograph of the ribs dated 03/27/2021 FINDINGS: Right chest wall port tip projects over the SVC. Normal lung volumes. No focal consolidations. No pleural effusion or pneumothorax. The heart size and mediastinal contours are within normal limits. Old healed fracture of the left clavicle and left posterior ribs. Unchanged T11 compression deformity.  IMPRESSION: 1. No active cardiopulmonary disease. 2. Unchanged T11 compression deformity. Electronically Signed   By: Darrin Nipper M.D.   On: 08/14/2022 14:09    Procedures Procedures    Medications Ordered in ED Medications  methylPREDNISolone sodium succinate (SOLU-MEDROL) 125 mg/2 mL injection 125 mg (has no administration in time range)  ondansetron (ZOFRAN) injection 4 mg (has no administration in time range)  sodium chloride 0.9 % bolus 500 mL (has no administration in time range)  fentaNYL (SUBLIMAZE) injection 50 mcg (has no administration in time range)  ipratropium-albuterol (DUONEB) 0.5-2.5 (3) MG/3ML nebulizer solution 3 mL (3 mLs Nebulization Given 08/14/22 1703)    ED Course/ Medical Decision Making/ A&P                           Medical Decision Making Amount and/or Complexity of Data Reviewed Radiology: ordered.  Risk Prescription drug management. Decision regarding hospitalization.   This patient presents to the ED for concern of sob, this involves an extensive number of treatment options, and is a complaint that carries with it a high risk of complications and morbidity.  The differential diagnosis includes pna, covid/flu/rsv, PE   Co morbidities that complicate the patient evaluation  depression, anxiety, multiple myeloma, and CKD   Additional history obtained:  Additional history obtained from epic chart review    Lab Tests:  I Ordered, and personally interpreted labs.  The pertinent results include:  cbc with hgb 9.7 (chronic), bmp with bun 25 and cr 1.63 (chronic); RXV +   Imaging Studies ordered:  I ordered imaging studies including cxr  I independently visualized and interpreted imaging which showed  1. No active cardiopulmonary disease.  2. Unchanged T11 compression deformity.   I agree with the radiologist interpretation   Cardiac Monitoring:  The patient was maintained  on a cardiac monitor.  I personally viewed and interpreted the  cardiac monitored which showed an underlying rhythm of: nsr   Medicines ordered and prescription drug management:  I ordered medication including duoneb, solumedrol  for sob  Reevaluation of the patient after these medicines showed that the patient improved I have reviewed the patients home medicines and have made adjustments as needed   Test Considered:  ct   Critical Interventions:  oxygen   Consultations Obtained:  I requested consultation with the hospitalist (Dr. Denton Brick),  and discussed lab and imaging findings as well as pertinent plan -she will admit   Problem List / ED Course:  RSV:  This is causing resp failure.  Pt put on 2L.  She is started on steroids and nebs.     Reevaluation:  After the interventions noted above, I reevaluated the patient and found that they have :improved   Social Determinants of Health:  Lives at home   Dispostion:  After consideration of the diagnostic results and the patients response to treatment, I feel that the patent would benefit from admission.    CRITICAL CARE Performed by: Isla Pence   Total critical care time: 30 minutes  Critical care time was exclusive of separately billable procedures and treating other patients.  Critical care was necessary to treat or prevent imminent or life-threatening deterioration.  Critical care was time spent personally by me on the following activities: development of treatment plan with patient and/or surrogate as well as nursing, discussions with consultants, evaluation of patient's response to treatment, examination of patient, obtaining history from patient or surrogate, ordering and performing treatments and interventions, ordering and review of laboratory studies, ordering and review of radiographic studies, pulse oximetry and re-evaluation of patient's condition.         Final Clinical Impression(s) / ED Diagnoses Final diagnoses:  RSV (acute bronchiolitis due to  respiratory syncytial virus)  Acute respiratory failure with hypoxia (HCC)  Stage 3b chronic kidney disease ([place])  Multiple myeloma not having achieved remission ([place])  On antineoplastic chemotherapy    Rx / DC Orders ED Discharge Orders     None         Isla Pence, MD 08/14/22 1711    Isla Pence, MD 08/14/22 1712

## 2022-08-14 NOTE — Telephone Encounter (Signed)
Patient called to advise that she has felt bad for about two weeks.  Cough is audibly severe, associated with wheezing.  She admits to coughing up brown sputum.  States she has not taken her temperature.  Dr. Delton Coombes made aware and she has been advised to go to the ER for evaluation.  Verbalized understanding.

## 2022-08-14 NOTE — H&P (Signed)
History and Physical    Alexandra Price JGG:[id number] DOB: 10/05/1945 DOA: 08/14/2022  PCP: [name], MD   Patient coming from: Home  I have personally briefly reviewed patient's old medical records in [place]  Chief Complaint: Difficulty Breathing  HPI: Alexandra Price is a 76 y.o. female with medical history significant for multiple myeloma, CKD 4, hypertension, depression, pulmonary embolism. Patient presented to the ED with complaints of 2 weeks of feeling ill.  She reports difficulty breathing, cough productive of brownish sputum, and wheezing.  She reports diarrhea also-up to 8 times a day.  No vomiting.  She reports chest pain to right lower rib and mid chest.  She is on Eliquis for PE, and has been compliant.  No lower extremity swelling.  ED Course: O2 sats dropped to 68% on room air when patient ambulated to the bathroom.  At rest sats greater than 92%.  Tmax 99.3.  Heart rate 91-105.  Respirate rate 18-30.  Blood pressure systolic 546T to 035W. Chest x-ray negative for acute abnormality. 125 mg Solu-Medrol, DuoNebs given.  500 mill bolus given.   Review of Systems: As per HPI all other systems reviewed and negative.  Past Medical History:  Diagnosis Date   Anxiety    Chronic kidney disease    Depression    Hypertension    Multiple myeloma ([place])    multiple myeloma   Pre-diabetes     Past Surgical History:  Procedure Laterality Date   ABDOMINAL HYSTERECTOMY     total   APPENDECTOMY     BALLOON DILATION N/A 05/28/2022   Procedure: BALLOON DILATION;  Surgeon: [name], DO;  [facility];  Service: Endoscopy;  Laterality: N/A;   BIOPSY  05/28/2022   Procedure: BIOPSY;  Surgeon: [name], DO;  Location[facility];  Service: Endoscopy;;   COLONOSCOPY WITH PROPOFOL N/A 12/25/2021   Procedure: COLONOSCOPY WITH PROPOFOL;  Surgeon: [name], DO;  Location: [facility];  Service: Endoscopy;  Laterality: N/A;  2:30pm    ESOPHAGOGASTRODUODENOSCOPY (EGD) WITH PROPOFOL N/A 05/28/2022   Procedure: ESOPHAGOGASTRODUODENOSCOPY (EGD) WITH PROPOFOL;  Surgeon: [name], DO;  [facility];  Service: Endoscopy;  Laterality: N/A;  10:00am, asa 3   LAPAROSCOPIC APPENDECTOMY N/A 05/20/2018   Procedure: APPENDECTOMY LAPAROSCOPIC;  Surgeon: [name], MD;  Location: [facility] ORS;  Service: General;  Laterality: N/A;   POLYPECTOMY  12/25/2021   Procedure: POLYPECTOMY;  Surgeon: [name], DO;  Location: [facility];  Service: Endoscopy;;   PORTACATH PLACEMENT Right 04/14/2018   Procedure: INSERTION PORT-A-CATH;  Surgeon: [name], MD;  Location: [facility] ORS;  Service: General;  Laterality: Right;     reports that she has never smoked. She has never used smokeless tobacco. She reports that she does not currently use drugs. She reports that she does not drink alcohol.  Allergies  Allergen Reactions   Ciprofloxacin Anaphylaxis   Amoxicillin Other (See Comments)    unknown   Morphine And Related Nausea And Vomiting   Augmentin [Amoxicillin-Pot Clavulanate] Other (See Comments)    Headache, insomnia    Family History  Problem Relation Age of Onset   Heart disease Mother    Emphysema Father    Diabetes Sister    Depression Sister    Cancer Brother        liver, lung, and colon. colon cancer at age 65.   Diabetes Brother     Prior to Admission medications  Medication Sig Start Date End Date Taking? Authorizing Provider  acetaminophen (TYLENOL) 325 MG tablet Take 650 mg by mouth as needed for moderate pain (prior to infusion).   Yes [provider]  acyclovir (ZOVIRAX) 400 MG tablet Take 1 tablet (400 mg total) by mouth 2 (two) times daily. 06/22/22  Yes Pennington, Rebekah M, [name]  ALPRAZolam (XANAX) 0.5 MG tablet TAKE 1 TABLET BY MOUTH DURING THE DAY AS NEEDED FOR ANXIETY AND 2 TABLETS AT BEDTIME AS NEEDED FOR ANXIETY/INSOMNIA 06/26/22  Yes [name], MD  Calcium  Carb-Cholecalciferol (CALCIUM 1000 + D PO) Take 1,000 mg by mouth daily.   Yes [provider]  cetirizine (ZYRTEC) 10 MG tablet Take 10 mg by mouth daily. 07/19/22  Yes [provider]  dexamethasone (DECADRON) 4 MG tablet Take 4 mg by mouth See admin instructions. 5 tablets 30 min prior to infusion   Yes [provider]  diphenhydrAMINE (BENADRYL) 25 MG tablet Take 50 mg by mouth as needed (prior to infusion).   Yes [provider]  ELIQUIS 2.5 MG TABS tablet Take 2.5 mg by mouth 2 (two) times daily. 06/14/22  Yes [provider]  escitalopram (LEXAPRO) 20 MG tablet Take 20 mg by mouth daily. 12/18/21  Yes [provider]  HYDROcodone-acetaminophen (NORCO) 10-325 MG tablet Take 1 tablet by mouth every 6 (six) hours as needed. 07/25/22  Yes [name], MD  ibuprofen (ADVIL) 200 MG tablet Take 200 mg by mouth every 6 (six) hours as needed for moderate pain. 04/30/22  Yes Mahala Menghini, [name]  montelukast (SINGULAIR) 10 MG tablet TAKE 1 TABLET BY MOUTH AS DIRECTED 30 MINUTES PRIOR TO TREATMENT Patient taking differently: Take 10 mg by mouth See admin instructions. Take 1 tablet by mouth as directed 30 minutes prior to treatment 06/21/22  Yes [name], MD  Multiple Vitamin (MULTI VITAMIN) TABS Take 1 tablet by mouth daily.   Yes [provider]  oxyCODONE (ROXICODONE) 5 MG immediate release tablet Take 2 tablets (10 mg total) by mouth every 6 (six) hours as needed for severe pain. Patient not taking: Reported on 08/14/2022 08/09/22   [name], MD  Oxycodone HCl 10 MG TABS Take 1 tablet (10 mg total) by mouth every 6 (six) hours as needed. 08/06/22  Yes [name], MD  pantoprazole (PROTONIX) 40 MG tablet Take 1 tablet (40 mg total) by mouth daily. 02/12/22  Yes [name], MD  polyethylene glycol (MIRALAX / GLYCOLAX) 17 g packet Take 17 g by mouth daily as needed for moderate constipation.  08/04/22  Yes Pollina, Gwenyth Allegra, MD  prochlorperazine (COMPAZINE) 10 MG tablet Take 1 tablet (10 mg total) by mouth every 6 (six) hours as needed for nausea or vomiting. 06/21/22  Yes [name], MD  rosuvastatin (CRESTOR) 10 MG tablet Take 10 mg by mouth daily. 05/30/20  Yes [provider]  zolpidem (AMBIEN) 10 MG tablet Take 1 tablet (10 mg total) by mouth at bedtime as needed for sleep. 05/23/22  Yes [name], MD  albuterol (VENTOLIN HFA) 108 (90 Base) MCG/ACT inhaler  07/19/22   [provider]  azithromycin (ZITHROMAX) 500 MG tablet  07/19/22   [provider]  dexAMETHasone 20 MG TABS Take 1 tablet by mouth as directed. Take 1 tablet 30 minutes prior to infusions Patient not taking: Reported on 08/14/2022 05/30/22   [name], MD  methocarbamol (ROBAXIN) 500 MG tablet Take 1 tablet (500 mg total) by mouth every 8 (eight) hours as  needed for muscle spasms (back pain). 08/02/22   Mesner, Corene Cornea, MD    Physical Exam: Vitals:   08/14/22 1330 08/14/22 1331 08/14/22 1615 08/14/22 1703  BP: 131/78  (!) 174/91   Pulse: 91  (!) 105   Resp: 18  (!) 30   Temp: 99.3 F (37.4 C)     TempSrc: Oral     SpO2: 92%  92% 93%  Weight:  81.6 kg    Height:  5' 5" (1.651 m)      Constitutional: NAD, calm, comfortable Vitals:   08/14/22 1330 08/14/22 1331 08/14/22 1615 08/14/22 1703  BP: 131/78  (!) 174/91   Pulse: 91  (!) 105   Resp: 18  (!) 30   Temp: 99.3 F (37.4 C)     TempSrc: Oral     SpO2: 92%  92% 93%  Weight:  81.6 kg    Height:  5' 5" (1.651 m)     Eyes: PERRL, lids and conjunctivae normal ENMT: Mucous membranes are moist.   Neck: normal, supple, no masses, no thyromegaly Respiratory: Diffuse expiratory wheezing, heard even without auscultation , mild increased work of breathing. No accessory muscle use.  Cardiovascular: Regular rate and rhythm, no murmurs / rubs / gallops. No extremity edema.  Abdomen: no tenderness,  no masses palpated. No hepatosplenomegaly. Bowel sounds positive.  Musculoskeletal: no clubbing / cyanosis. No joint deformity upper and lower extremities.  Skin: no rashes, lesions, ulcers. No induration Neurologic: No apparent cranial nerve abnormality, moving extremities spontaneously. Psychiatric: Normal judgment and insight. Alert and oriented x 3. Normal mood.   Labs on Admission: I have personally reviewed following labs and imaging studies  CBC: Recent Labs  Lab 08/14/22 1454  WBC 5.0  NEUTROABS 4.2  HGB 9.7*  HCT 31.1*  MCV 105.8*  PLT 937*   Basic Metabolic Panel: Recent Labs  Lab 08/14/22 1454  NA 135  K 4.0  CL 97*  CO2 27  GLUCOSE 149*  BUN 25*  CREATININE 1.63*  CALCIUM 8.4*   GFR: Estimated Creatinine Clearance: 31.9 mL/min (A) (by C-G formula based on SCr of 1.63 mg/dL (H)). Liver Function Tests: Recent Labs  Lab 08/14/22 1454  AST 26  ALT 30  ALKPHOS 79  BILITOT 0.5  PROT 7.0  ALBUMIN 3.8    Radiological Exams on Admission: DG Chest 2 View  Result Date: 08/14/2022 CLINICAL DATA:  Two-week history of productive cough, congestion, and chills EXAM: CHEST - 2 VIEW COMPARISON:  Radiograph of the ribs dated 03/27/2021 FINDINGS: Right chest wall port tip projects over the SVC. Normal lung volumes. No focal consolidations. No pleural effusion or pneumothorax. The heart size and mediastinal contours are within normal limits. Old healed fracture of the left clavicle and left posterior ribs. Unchanged T11 compression deformity. IMPRESSION: 1. No active cardiopulmonary disease. 2. Unchanged T11 compression deformity. Electronically Signed   By: Darrin Nipper M.D.   On: 08/14/2022 14:09    EKG: Independently reviewed.  Sinus rhythm rate 94, QTc 460.  No significant change from prior.  Assessment/Plan Principal Problem:   Acute bronchitis Active Problems:   Chest pain   Acute respiratory failure with hypoxia (HCC)   RSV bronchitis   Multiple myeloma without  remission (HCC)   CKD (chronic kidney disease) stage 4, GFR 15-29 ml/min (HCC)   HTN (hypertension)   Pulmonary embolism (HCC)  Assessment and Plan: * Acute bronchitis Acute bronchitis provoked by RSV infection.  Dyspnea, hypoxia, productive cough, wheezing.  Chest x-ray clear.  O2 sats dropped to 68% on room air with ambulation.  Sats 92% on room air at rest.  Currently on 3 L.  Chest x-ray clear.  Doubt PE, she is on Eliquis and compliant. - Albuterol Nebs as needed and scheduled -IV Solu-Medrol 125 mg given, continue 60 twice daily -Mucolytic's - Reports some diarrhea, 567ms given, continue N/s 100cc/hr x 15hrs - CBG q12h while on steroids   Chest pain Likely musculoskeletal in etiology, due to coughing and dyspnea.  With acute PE, she is on Eliquis and compliant.  EKG without changes.  No documented cardiac disease.   -Trend troponins  Pulmonary embolism (HCC) History of pulmonary embolism.  On Eliquis and compliance. -Resume Eliquis  CKD (chronic kidney disease) stage 4, GFR 15-29 ml/min (HCC) Creatinine 1.63, stable.  Baseline 1.3-1.5.  [place]  Currently on chemotherapy and once weekly dexamethasone.  Recent scans shows numerous lucent bone lesions compatible with multiple myeloma, also has compression fractures. -Resume home pain medications    DVT prophylaxis: Eliquis Code Status: Full Family Communication: None at bedside Disposition Plan: ~ 2 days Consults called: None Admission status:  Obs tele     Author: EBethena Roys MD 08/14/2022 5:52 PM  For on call review [email or web address]

## 2022-08-14 NOTE — Assessment & Plan Note (Signed)
History of pulmonary embolism.  On Eliquis and compliance. -Resume Eliquis

## 2022-08-14 NOTE — Assessment & Plan Note (Addendum)
Acute bronchitis provoked by RSV infection.  Dyspnea, hypoxia, productive cough, wheezing.  Chest x-ray clear.  O2 sats dropped to 68% on room air with ambulation.  Sats 92% on room air at rest.  Currently on 3 L.  Chest x-ray clear.  Doubt PE, she is on Eliquis and compliant. - Albuterol Nebs as needed and scheduled -IV Solu-Medrol 125 mg given, continue 60 twice daily -Mucolytic's - Reports some diarrhea, 551ms given, continue N/s 100cc/hr x 15hrs - CBG q12h while on steroids

## 2022-08-14 NOTE — ED Triage Notes (Signed)
Pt receiving chemo for multiple myloma, last tx last week

## 2022-08-14 NOTE — ED Provider Triage Note (Signed)
Emergency Medicine Provider Triage Evaluation Note  Alexandra Price , a 76 y.o. female  was evaluated in triage.  Pt complains of cough.  Pt has multiple myeloma  Review of Systems  Positive: Fever and shortness of breah Negative: vomiting  Physical Exam  BP 131/78 (BP Location: Right Arm)   Pulse 91   Temp 99.3 F (37.4 C) (Oral)   Resp 18   Ht _0  (1.651 m)   Wt 81.6 kg   SpO2 (!) 87%   BMI 29.95 kg/m  Gen:   Awake, no distress   Resp:  Normal effort  MSK:   Moves extremities without difficulty  Other:    Medical Decision Making  Medically screening exam initiated at 2:33 PM.  Appropriate orders placed.  SUZANA SOHAIL was informed that the remainder of the evaluation will be completed by another provider, this initial triage assessment does not replace that evaluation, and the importance of remaining in the ED until their evaluation is complete.     Fransico Meadow, [place] 08/14/22 1434

## 2022-08-14 NOTE — Assessment & Plan Note (Signed)
Follows with [place].  Currently on chemotherapy and once weekly dexamethasone.  Recent scans shows numerous lucent bone lesions compatible with multiple myeloma, also has compression fractures. -Resume home pain medications

## 2022-08-14 NOTE — ED Triage Notes (Signed)
Pt in c/o productive cough with brown phlegm onset x 2 wks, pt c/o congestion, c/o chills, denies fever, denies n/v, pt reports diarrhea, pt reports 8 loose stools in 24 hours, pt c/o SOB with laying flat, A&O x4

## 2022-08-14 NOTE — Assessment & Plan Note (Signed)
Likely musculoskeletal in etiology, due to coughing and dyspnea.  With acute PE, she is on Eliquis and compliant.  EKG without changes.  No documented cardiac disease.   -Trend troponins

## 2022-08-14 NOTE — Assessment & Plan Note (Signed)
Creatinine 1.63, stable.  Baseline 1.3-1.5.

## 2022-08-15 DIAGNOSIS — I129 Hypertensive chronic kidney disease with stage 1 through stage 4 chronic kidney disease, or unspecified chronic kidney disease: Secondary | ICD-10-CM | POA: Diagnosis not present

## 2022-08-15 DIAGNOSIS — Z800 Family history of malignant neoplasm of digestive organs: Secondary | ICD-10-CM | POA: Diagnosis not present

## 2022-08-15 DIAGNOSIS — Z833 Family history of diabetes mellitus: Secondary | ICD-10-CM | POA: Diagnosis not present

## 2022-08-15 DIAGNOSIS — C9000 Multiple myeloma not having achieved remission: Secondary | ICD-10-CM | POA: Diagnosis not present

## 2022-08-15 DIAGNOSIS — Z881 Allergy status to other antibiotic agents status: Secondary | ICD-10-CM | POA: Diagnosis not present

## 2022-08-15 DIAGNOSIS — J205 Acute bronchitis due to respiratory syncytial virus: Secondary | ICD-10-CM | POA: Diagnosis present

## 2022-08-15 DIAGNOSIS — J209 Acute bronchitis, unspecified: Secondary | ICD-10-CM | POA: Diagnosis present

## 2022-08-15 DIAGNOSIS — Z90710 Acquired absence of both cervix and uterus: Secondary | ICD-10-CM | POA: Diagnosis not present

## 2022-08-15 DIAGNOSIS — Z825 Family history of asthma and other chronic lower respiratory diseases: Secondary | ICD-10-CM | POA: Diagnosis not present

## 2022-08-15 DIAGNOSIS — J210 Acute bronchiolitis due to respiratory syncytial virus: Secondary | ICD-10-CM | POA: Diagnosis not present

## 2022-08-15 DIAGNOSIS — D631 Anemia in chronic kidney disease: Secondary | ICD-10-CM | POA: Diagnosis not present

## 2022-08-15 DIAGNOSIS — Z7952 Long term (current) use of systemic steroids: Secondary | ICD-10-CM | POA: Diagnosis not present

## 2022-08-15 DIAGNOSIS — Z7901 Long term (current) use of anticoagulants: Secondary | ICD-10-CM | POA: Diagnosis not present

## 2022-08-15 DIAGNOSIS — E1165 Type 2 diabetes mellitus with hyperglycemia: Secondary | ICD-10-CM | POA: Diagnosis not present

## 2022-08-15 DIAGNOSIS — D630 Anemia in neoplastic disease: Secondary | ICD-10-CM | POA: Diagnosis not present

## 2022-08-15 DIAGNOSIS — Z7960 Long term (current) use of unspecified immunomodulators and immunosuppressants: Secondary | ICD-10-CM | POA: Diagnosis not present

## 2022-08-15 DIAGNOSIS — Z1152 Encounter for screening for COVID-19: Secondary | ICD-10-CM | POA: Diagnosis not present

## 2022-08-15 DIAGNOSIS — J9601 Acute respiratory failure with hypoxia: Secondary | ICD-10-CM | POA: Diagnosis not present

## 2022-08-15 DIAGNOSIS — Z885 Allergy status to narcotic agent status: Secondary | ICD-10-CM | POA: Diagnosis not present

## 2022-08-15 DIAGNOSIS — Z86711 Personal history of pulmonary embolism: Secondary | ICD-10-CM | POA: Diagnosis not present

## 2022-08-15 DIAGNOSIS — N184 Chronic kidney disease, stage 4 (severe): Secondary | ICD-10-CM | POA: Diagnosis not present

## 2022-08-15 DIAGNOSIS — I10 Essential (primary) hypertension: Secondary | ICD-10-CM | POA: Diagnosis not present

## 2022-08-15 DIAGNOSIS — Z8249 Family history of ischemic heart disease and other diseases of the circulatory system: Secondary | ICD-10-CM | POA: Diagnosis not present

## 2022-08-15 LAB — CBC
HCT: 28.9 % — ABNORMAL LOW (ref 36.0–46.0)
Hemoglobin: 9.1 g/dL — ABNORMAL LOW (ref 12.0–15.0)
MCH: 33.1 pg (ref 26.0–34.0)
MCHC: 31.5 g/dL (ref 30.0–36.0)
MCV: 105.1 fL — ABNORMAL HIGH (ref 80.0–100.0)
Platelets: 101 10*3/uL — ABNORMAL LOW (ref 150–400)
RBC: 2.75 MIL/uL — ABNORMAL LOW (ref 3.87–5.11)
RDW: 14 % (ref 11.5–15.5)
WBC: 4.6 10*3/uL (ref 4.0–10.5)
nRBC: 0 % (ref 0.0–0.2)

## 2022-08-15 LAB — BASIC METABOLIC PANEL
Anion gap: 11 (ref 5–15)
BUN: 27 mg/dL — ABNORMAL HIGH (ref 8–23)
CO2: 25 mmol/L (ref 22–32)
Calcium: 8.1 mg/dL — ABNORMAL LOW (ref 8.9–10.3)
Chloride: 103 mmol/L (ref 98–111)
Creatinine, Ser: 1.6 mg/dL — ABNORMAL HIGH (ref 0.44–1.00)
GFR, Estimated: 34 mL/min — ABNORMAL LOW (ref >60)
Glucose, Bld: 424 mg/dL — ABNORMAL HIGH (ref 70–99)
Potassium: 4.9 mmol/L (ref 3.5–5.1)
Sodium: 139 mmol/L (ref 135–145)

## 2022-08-15 LAB — GLUCOSE, CAPILLARY
Glucose-Capillary: 374 mg/dL — ABNORMAL HIGH (ref 70–99)
Glucose-Capillary: 266 mg/dL — ABNORMAL HIGH (ref 70–99)
Glucose-Capillary: 130 mg/dL — ABNORMAL HIGH (ref 70–99)
Glucose-Capillary: 186 mg/dL — ABNORMAL HIGH (ref 70–99)

## 2022-08-15 LAB — HEMOGLOBIN A1C
Hgb A1c MFr Bld: 8.7 % — ABNORMAL HIGH (ref 4.8–5.6)
Mean Plasma Glucose: 203 mg/dL

## 2022-08-15 MED ORDER — INSULIN GLARGINE-YFGN 100 UNIT/ML ~~LOC~~ SOLN
10.0000 [IU] | Freq: Every day | SUBCUTANEOUS | Status: DC
  Administered 2022-08-15 – 2022-08-17 (×3): 10 [IU] via SUBCUTANEOUS
  Filled 2022-08-15 (×4): qty 0.1

## 2022-08-15 MED ORDER — INSULIN ASPART 100 UNIT/ML IJ SOLN
0.0000 [IU] | Freq: Three times a day (TID) | INTRAMUSCULAR | Status: DC
  Administered 2022-08-15: 15 [IU] via SUBCUTANEOUS
  Administered 2022-08-15: 2 [IU] via SUBCUTANEOUS
  Administered 2022-08-15: 8 [IU] via SUBCUTANEOUS
  Administered 2022-08-16: 5 [IU] via SUBCUTANEOUS
  Administered 2022-08-16: 8 [IU] via SUBCUTANEOUS
  Administered 2022-08-16: 2 [IU] via SUBCUTANEOUS
  Administered 2022-08-17: 5 [IU] via SUBCUTANEOUS
  Administered 2022-08-17: 8 [IU] via SUBCUTANEOUS

## 2022-08-15 MED ORDER — METHYLPREDNISOLONE SODIUM SUCC 40 MG IJ SOLR
40.0000 mg | Freq: Two times a day (BID) | INTRAMUSCULAR | Status: DC
  Administered 2022-08-15 – 2022-08-17 (×5): 40 mg via INTRAVENOUS
  Filled 2022-08-15 (×5): qty 1

## 2022-08-15 MED ORDER — INSULIN ASPART 100 UNIT/ML IJ SOLN
0.0000 [IU] | Freq: Every day | INTRAMUSCULAR | Status: DC
  Administered 2022-08-16: 2 [IU] via SUBCUTANEOUS

## 2022-08-15 NOTE — Progress Notes (Signed)
Patient admitted to unit last night in stable condition, able to answer questions appropriately. O2 continuous @ 3L via n/c with audible expiratory wheezing noted during admission. Patient ambulated self to and from the bathroom in bedroom, ate dinner meal and rested quietly through night. Telemetry box in place and call bell within reach.

## 2022-08-15 NOTE — TOC Progression Note (Signed)
  Transition of Care [facility]) Screening Note   Patient Details  Name: SHEKETA ENDE Date of Birth: August 26, 1945   Transition [facility] Care [facility]) CM/SW Contact:    Shade Flood, LCSW Phone Number: 08/15/2022, 2:25 PM    Transition of Care Department [facility]) has reviewed patient and no TOC needs have been identified at this time. We will continue to monitor patient advancement through interdisciplinary progression rounds. If new patient transition needs arise, please place a TOC consult.

## 2022-08-15 NOTE — Progress Notes (Signed)
PROGRESS NOTE     Alexandra Price, is a 76 y.o. female, DOB - Aug 30, 1945, [id number]  Admit date - 08/14/2022   Admitting Physician Sanaya Gwilliam Denton Brick, MD  Outpatient Primary MD for the patient is [name], MD  LOS - 0  Chief Complaint  Patient presents with   Cough        Brief Narrative:  76 y.o. female with medical history significant for multiple myeloma, CKD 4, hypertension, depression, pulmonary embolism admitted on 08/15/2022 with acute hypoxic respiratory failure secondary to RSV bronchitis/bronchiolitis    -Assessment and Plan: 1)acute hypoxic respiratory failure secondary to RSV bronchitis/bronchiolitis -Unable to wean off oxygen at this time -Patient is still requiring 3 L of oxygen via nasal cannula -Chest x-ray without acute findings -Patient was compliant with Eliquis PTA due to history of prior PE -Continue bronchodilators mucolytics and steroids -Cough, dyspnea and hypoxia and wheezing persist  2)Chest pain Likely musculoskeletal in etiology in the setting of excessive coughing -Troponin  and EKG are nonacute -Chest x-ray without acute findings -Pneumatic management  3)H/o Pulmonary embolism  -Patient reports compliance with Eliquis -Continue same  4)CKD stage -IV -Renal function appears to be close to baseline at this time - renally adjust medications, avoid nephrotoxic agents / dehydration  / hypotension  5)Multiple myeloma without remission ([place]) Follows with [place].  Currently on chemotherapy and once weekly dexamethasone.  Recent scans shows numerous lucent bone lesions compatible with multiple myeloma, also has compression fractures. -c/n home pain medications  6) chronic anemia--- stable -Suspect related to #4 #5 above -Watch closely as patient is on Eliquis -No bleeding concerns at this time  Status is: Inpatient   Disposition: The patient is from: Home              Anticipated d/c is to: Home  ?? With Oxygen                Anticipated d/c date is: 2 days              Patient currently is not medically stable to d/c. Barriers: Not Clinically Stable-   Code Status :  -  Code Status: Full Code   Family Communication:    NA (patient is alert, awake and coherent)   DVT Prophylaxis  :   - SCDs  apixaban (ELIQUIS) tablet 2.5 mg Start: 08/14/22 2200 apixaban (ELIQUIS) tablet 2.5 mg   Lab Results  Component Value Date   PLT 101 (L) 08/15/2022    Inpatient Medications  Scheduled Meds:  acyclovir  400 mg Oral BID   apixaban  2.5 mg Oral BID   escitalopram  20 mg Oral Daily   insulin aspart  0-15 Units Subcutaneous TID WC   insulin aspart  0-5 Units Subcutaneous QHS   insulin glargine-yfgn  10 Units Subcutaneous Daily   methylPREDNISolone (SOLU-MEDROL) injection  40 mg Intravenous Q12H   pantoprazole  40 mg Oral Daily   rosuvastatin  10 mg Oral Daily   Continuous Infusions: PRN Meds:.acetaminophen **OR** acetaminophen, albuterol, ALPRAZolam, HYDROcodone-acetaminophen, ondansetron **OR** ondansetron (ZOFRAN) IV, zolpidem   Anti-infectives (From admission, onward)    Start     Dose/Rate Route Frequency Ordered Stop   08/14/22 2200  acyclovir (ZOVIRAX) tablet 400 mg        400 mg Oral 2 times daily 08/14/22 2021           Subjective: Alexandra Price today has no fevers, no emesis,  - -Cough, dyspnea and hypoxia and wheezing persist No fever  Or chills   Objective: Vitals:   08/15/22 0214 08/15/22 0600 08/15/22 0737 08/15/22 1331  BP:  (!) 140/78  136/75  Pulse:  88  85  Resp:    18  Temp:  97.7 F (36.5 C)  98.2 F (36.8 C)  TempSrc:  Oral    SpO2: 91% 95% 96% 92%  Weight:      Height:        Intake/Output Summary (Last 24 hours) at 08/15/2022 1801 Last data filed at 08/15/2022 0900 Gross per 24 hour  Intake 695 ml  Output --  Net 695 ml   Filed Weights   08/14/22 1331 08/14/22 2022  Weight: 81.6 kg 80.8 kg    Physical Exam  Gen:- Awake Alert, dyspnea on exertion persist,  some conversational dyspnea HEENT:- [place].AT, No sclera icterus Nose- [place] 3L/min Neck-Supple Neck,No JVD,.  Lungs-loose breath sounds with scattered wheezes bilaterally,  CV- S1, S2 normal, regular  Abd-  +ve B.Sounds, Abd Soft, No tenderness,    Extremity/Skin:- No  edema, pedal pulses present  Psych-affect is appropriate, oriented x3 Neuro-no new focal deficits, no tremors  Data Reviewed: I have personally reviewed following labs and imaging studies  CBC: Recent Labs  Lab 08/14/22 1454 08/15/22 0402  WBC 5.0 4.6  NEUTROABS 4.2  --   HGB 9.7* 9.1*  HCT 31.1* 28.9*  MCV 105.8* 105.1*  PLT 103* 102*   Basic Metabolic Panel: Recent Labs  Lab 08/14/22 1454 08/15/22 0402  NA 135 139  K 4.0 4.9  CL 97* 103  CO2 27 25  GLUCOSE 149* 424*  BUN 25* 27*  CREATININE 1.63* 1.60*  CALCIUM 8.4* 8.1*   GFR: Estimated Creatinine Clearance: 31.1 mL/min (A) (by C-G formula based on SCr of 1.6 mg/dL (H)). Liver Function Tests: Recent Labs  Lab 08/14/22 1454  AST 26  ALT 30  ALKPHOS 79  BILITOT 0.5  PROT 7.0  ALBUMIN 3.8   Recent Results (from the past 240 hour(s))  Resp panel by RT-PCR (RSV, Flu A&B, Covid) Anterior Nasal Swab     Status: Abnormal   Collection Time: 08/14/22  3:00 PM   Specimen: Anterior Nasal Swab  Result Value Ref Range Status   SARS Coronavirus 2 by RT PCR NEGATIVE NEGATIVE Final    Comment: (NOTE) SARS-CoV-2 target nucleic acids are NOT DETECTED.  The SARS-CoV-2 RNA is generally detectable in upper respiratory specimens during the acute phase of infection. The lowest concentration of SARS-CoV-2 viral copies this assay can detect is 138 copies/mL. A negative result does not preclude SARS-Cov-2 infection and should not be used as the sole basis for treatment or other patient management decisions. A negative result may occur with  improper specimen collection/handling, submission of specimen other than nasopharyngeal swab, presence of viral mutation(s)  within the areas targeted by this assay, and inadequate number of viral copies(<138 copies/mL). A negative result must be combined with clinical observations, patient history, and epidemiological information. The expected result is Negative.  Fact Sheet for Patients:  EntrepreneurPulse.com.au  Fact Sheet for Healthcare Providers:  IncredibleEmployment.be  This test is no t yet approved or cleared by the [place] FDA and  has been authorized for detection and/or diagnosis of SARS-CoV-2 by FDA under an Emergency Use Authorization (EUA). This EUA will remain  in effect (meaning this test can be used) for the duration of the COVID-19 declaration under Section 564(b)(1) of the Act, 21 U.S.C.section 360bbb-3(b)(1), unless the authorization is terminated  or revoked sooner.  Influenza A by PCR NEGATIVE NEGATIVE Final   Influenza B by PCR NEGATIVE NEGATIVE Final    Comment: (NOTE) The Xpert Xpress SARS-CoV-2/FLU/RSV plus assay is intended as an aid in the diagnosis of influenza from Nasopharyngeal swab specimens and should not be used as a sole basis for treatment. Nasal washings and aspirates are unacceptable for Xpert Xpress SARS-CoV-2/FLU/RSV testing.  Fact Sheet for Patients: EntrepreneurPulse.com.au  Fact Sheet for Healthcare Providers: IncredibleEmployment.be  This test is not yet approved or cleared by the [place] FDA and has been authorized for detection and/or diagnosis of SARS-CoV-2 by FDA under an Emergency Use Authorization (EUA). This EUA will remain in effect (meaning this test can be used) for the duration of the COVID-19 declaration under Section 564(b)(1) of the Act, 21 U.S.C. section 360bbb-3(b)(1), unless the authorization is terminated or revoked.     Resp Syncytial Virus by PCR POSITIVE (A) NEGATIVE Final    Comment: (NOTE) Fact Sheet for  Patients: EntrepreneurPulse.com.au  Fact Sheet for Healthcare Providers: IncredibleEmployment.be  This test is not yet approved or cleared by the [place] FDA and has been authorized for detection and/or diagnosis of SARS-CoV-2 by FDA under an Emergency Use Authorization (EUA). This EUA will remain in effect (meaning this test can be used) for the duration of the COVID-19 declaration under Section 564(b)(1) of the Act, 21 U.S.C. section 360bbb-3(b)(1), unless the authorization is terminated or revoked.  Performed at [facility], [address] [place], [place] [address]       Radiology Studies: DG Chest 2 View  Result Date: 08/14/2022 CLINICAL DATA:  Two-week history of productive cough, congestion, and chills EXAM: CHEST - 2 VIEW COMPARISON:  Radiograph of the ribs dated 03/27/2021 FINDINGS: Right chest wall port tip projects over the SVC. Normal lung volumes. No focal consolidations. No pleural effusion or pneumothorax. The heart size and mediastinal contours are within normal limits. Old healed fracture of the left clavicle and left posterior ribs. Unchanged T11 compression deformity. IMPRESSION: 1. No active cardiopulmonary disease. 2. Unchanged T11 compression deformity. Electronically Signed   By: Darrin Nipper M.D.   On: 08/14/2022 14:09     Scheduled Meds:  acyclovir  400 mg Oral BID   apixaban  2.5 mg Oral BID   escitalopram  20 mg Oral Daily   insulin aspart  0-15 Units Subcutaneous TID WC   insulin aspart  0-5 Units Subcutaneous QHS   insulin glargine-yfgn  10 Units Subcutaneous Daily   methylPREDNISolone (SOLU-MEDROL) injection  40 mg Intravenous Q12H   pantoprazole  40 mg Oral Daily   rosuvastatin  10 mg Oral Daily   Continuous Infusions:   LOS: 0 days    Roxan Hockey M.D on 08/15/2022 at 6:01 PM  Go to [email or web address] - for contact info  [facility] - Office  [phone]  If 7PM-7AM, please contact  night-coverage [email or web address] 08/15/2022, 6:01 PM

## 2022-08-16 DIAGNOSIS — J205 Acute bronchitis due to respiratory syncytial virus: Secondary | ICD-10-CM | POA: Diagnosis not present

## 2022-08-16 LAB — GLUCOSE, CAPILLARY
Glucose-Capillary: 211 mg/dL — ABNORMAL HIGH (ref 70–99)
Glucose-Capillary: 131 mg/dL — ABNORMAL HIGH (ref 70–99)
Glucose-Capillary: 254 mg/dL — ABNORMAL HIGH (ref 70–99)
Glucose-Capillary: 236 mg/dL — ABNORMAL HIGH (ref 70–99)

## 2022-08-16 MED ORDER — DM-GUAIFENESIN ER 30-600 MG PO TB12
1.0000 | ORAL_TABLET | Freq: Two times a day (BID) | ORAL | Status: DC
  Administered 2022-08-16 – 2022-08-17 (×3): 1 via ORAL
  Filled 2022-08-16 (×3): qty 1

## 2022-08-16 MED ORDER — IPRATROPIUM-ALBUTEROL 0.5-2.5 (3) MG/3ML IN SOLN
3.0000 mL | Freq: Four times a day (QID) | RESPIRATORY_TRACT | Status: DC
  Administered 2022-08-16 – 2022-08-17 (×4): 3 mL via RESPIRATORY_TRACT
  Filled 2022-08-16 (×4): qty 3

## 2022-08-16 NOTE — TOC Initial Note (Signed)
Transition of Care [facility]) - Initial/Assessment Note    Patient Details  Name: Alexandra Price MRN: [id number] Date of Birth: 1946/02/18  Transition [facility]) CM/SW Contact:    Iona Beard, [place] Phone Number: 08/16/2022, 1:49 PM  Clinical Narrative:                 Pt is high risk for readmission. CSW spoke with pt to complete assessment. Pt states that she lives with her son. He is able to assist with ADLs when needed. Pt is able to drive when needed. Pt states she has not had HH nor does she feel like it is needed at this time. Pt states that she does not use any DME. TOC to follow.   Expected Discharge Plan: Home/Self Care Barriers to Discharge: Continued Medical Work up   Patient Goals and CMS Choice Patient states their goals for this hospitalization and ongoing recovery are:: return home CMS Medicare.gov Compare Post Acute Care list provided to:: Patient Choice offered to / list presented to : Patient      Expected Discharge Plan and Services In-house Referral: Clinical Social Work Discharge Planning Services: CM Consult   Living arrangements for the past 2 months: Single Family Home                                      Prior Living Arrangements/Services Living arrangements for the past 2 months: Single Family Home Lives with:: Adult Children Patient language and need for interpreter reviewed:: Yes Do you feel safe going back to the place where you live?: Yes      Need for Family Participation in Patient Care: Yes (Comment) Care giver support system in place?: Yes (comment)   Criminal Activity/Legal Involvement Pertinent to Current Situation/Hospitalization: No - Comment as needed  Activities of Daily Living Home Assistive Devices/Equipment: None ADL Screening (condition at time of admission) Patient's cognitive ability adequate to safely complete daily activities?: Yes Is the patient deaf or have difficulty hearing?: No Does the patient have  difficulty seeing, even when wearing glasses/contacts?: No Does the patient have difficulty concentrating, remembering, or making decisions?: No Patient able to express need for assistance with ADLs?: Yes Does the patient have difficulty dressing or bathing?: No Independently performs ADLs?: No Communication: Independent Dressing (OT): Independent Grooming: Independent Feeding: Independent Bathing: Independent Toileting: Independent In/Out Bed: Independent Walks in Home: Independent Does the patient have difficulty walking or climbing stairs?: No Weakness of Legs: None Weakness of Arms/Hands: None  Permission Sought/Granted                  Emotional Assessment Appearance:: Appears stated age Attitude/Demeanor/Rapport: Engaged Affect (typically observed): Accepting Orientation: : Oriented to Self, Oriented to Place, Oriented to  Time, Oriented to Situation Alcohol / Substance Use: Not Applicable Psych Involvement: No (comment)  Admission diagnosis:  Acute bronchitis [J20.9] RSV (acute bronchiolitis due to respiratory syncytial virus) [J21.0] Acute respiratory failure with hypoxia (HCC) [J96.01] Multiple myeloma not having achieved remission (HCC) [C90.00] On antineoplastic chemotherapy [Z79.899] Stage 3b chronic kidney disease ([place]) [N18.32] Patient Active Problem List   Diagnosis Date Noted   Acute bronchitis 08/14/2022   RSV bronchitis 08/14/2022   Pulmonary embolism ([place]) 08/14/2022   Chronic RUQ pain 04/30/2022   Constipation 04/30/2022   Pneumonia 10/13/2020   Acute respiratory failure with hypoxia ([place]) 10/13/2020   Palliative care patient 06/26/2019   Chest pain  06/04/2018   Depression 05/29/2018   Hyperglycemia 05/28/2018   HTN (hypertension) 05/28/2018   Anxiety 05/28/2018   Rash 05/27/2018   Pseudomonas infection 05/26/2018   SOB (shortness of breath) 05/24/2018   CAP (community acquired pneumonia) 05/23/2018   Fever 05/22/2018   Sinus tachycardia  05/22/2018   CKD (chronic kidney disease) stage 4, GFR 15-29 ml/min ([place]) 05/22/2018   S/P laparoscopic appendectomy 05/20/2018   Multiple myeloma without remission ([place]) 04/15/2018   Goals of care, counseling/discussion 04/15/2018   Multiple myeloma not having achieved remission ([place]) 04/07/2018   PCP:  [name], MD Pharmacy:   [place], [place] - [place] [address] [place] [address] Phone: [phone] Fax: [phone]     Social Determinants of Health (SDOH) Social History: SDOH Screenings   Food Insecurity: No Food Insecurity (08/14/2022)  Housing: Low Risk  (08/14/2022)  Transportation Needs: No Transportation Needs (08/14/2022)  Utilities: Not At Risk (08/14/2022)  Alcohol Screen: Low Risk  (07/06/2020)  Depression (PHQ2-9): Low Risk  (07/06/2020)  Financial Resource Strain: Low Risk  (07/06/2020)  Physical Activity: Inactive (07/06/2020)  Social Connections: Moderately Isolated (07/06/2020)  Stress: No Stress Concern Present (07/06/2020)  Tobacco Use: Low Risk  (08/14/2022)   SDOH Interventions:     Readmission Risk Interventions    08/16/2022    1:48 PM  Readmission Risk Prevention Plan  Transportation Screening Complete  HRI or [place] Complete  Social Work Consult for [place] Planning/Counseling Complete  Palliative Care Screening Not Applicable  Medication Review Press photographer) Complete

## 2022-08-16 NOTE — Progress Notes (Signed)
PROGRESS NOTE     Alexandra Price, is a 76 y.o. female, DOB - [birth year], IDP:[id number]  Admit date - 08/14/2022   Admitting Physician Alexandra Wyss Denton Brick, MD  Outpatient Primary MD for the patient is Alexandra Richard, MD  LOS - 1  Chief Complaint  Patient presents with   Cough        Brief Narrative:  76 y.o. female with medical history significant for multiple myeloma, CKD 4, hypertension, depression, pulmonary embolism admitted on 08/15/2022 with acute hypoxic respiratory failure secondary to RSV bronchitis/bronchiolitis    -Assessment and Plan: 1)acute hypoxic respiratory failure secondary to RSV bronchitis/bronchiolitis -Patient is still requiring 2 L of oxygen via nasal cannula -Chest x-ray without acute findings -Patient was compliant with Eliquis PTA due to history of prior PE -Continue bronchodilators mucolytics and steroids --Hypoxia improving currently down to 1 to 2 L of oxygen via nasal cannula -Attempted to wean off oxygen completely patient desaturated with ambulation on room to 87% on room air -Cough   is not worse -Dyspnea at rest mostly resolved dyspnea on exertion persist  2)Chest pain Likely musculoskeletal in etiology in the setting of excessive coughing -Troponin  and EKG are nonacute -Chest x-ray without acute findings -Pneumatic management  3)H/o Pulmonary embolism  -Patient reports compliance with Eliquis -Continue same  4)CKD stage -IV -Renal function appears to be close to baseline at this time - renally adjust medications, avoid nephrotoxic agents / dehydration  / hypotension  5)Multiple myeloma without remission ([place]) Follows with Alexandra Price.  Currently on chemotherapy and once weekly dexamethasone.  Recent scans shows numerous lucent bone lesions compatible with multiple myeloma, also has compression fractures. -c/n home pain medications  6) chronic anemia--- stable -Suspect related to #4 #5 above -Watch closely as patient is on  Eliquis -No bleeding concerns at this time  Status is: Inpatient   Disposition: The patient is from: Home              Anticipated d/c is to: Home  ?? With Oxygen               Anticipated d/c date is: 1 day              Patient currently is not medically stable to d/c. Barriers: Not Clinically Stable-   Code Status :  -  Code Status: Full Code   Family Communication:    NA (patient is alert, awake and coherent)   DVT Prophylaxis  :   - SCDs  apixaban (ELIQUIS) tablet 2.5 mg Start: 08/14/22 2200 apixaban (ELIQUIS) tablet 2.5 mg   Lab Results  Component Value Date   PLT 101 (L) 08/15/2022    Inpatient Medications  Scheduled Meds:  acyclovir  400 mg Oral BID   apixaban  2.5 mg Oral BID   dextromethorphan-guaiFENesin  1 tablet Oral BID   escitalopram  20 mg Oral Daily   insulin aspart  0-15 Units Subcutaneous TID WC   insulin aspart  0-5 Units Subcutaneous QHS   insulin glargine-yfgn  10 Units Subcutaneous Daily   ipratropium-albuterol  3 mL Nebulization Q6H   methylPREDNISolone (SOLU-MEDROL) injection  40 mg Intravenous Q12H   pantoprazole  40 mg Oral Daily   rosuvastatin  10 mg Oral Daily   Continuous Infusions: PRN Meds:.acetaminophen **OR** acetaminophen, albuterol, ALPRAZolam, HYDROcodone-acetaminophen, ondansetron **OR** ondansetron (ZOFRAN) IV, zolpidem   Anti-infectives (From admission, onward)    Start     Dose/Rate Route Frequency Ordered Stop   08/14/22 2200  acyclovir (ZOVIRAX) tablet  400 mg        400 mg Oral 2 times daily 08/14/22 2021           Subjective: Alexandra Price today has no fevers, no emesis,  - -Hypoxia improving currently down to 1 to 2 L of oxygen via nasal cannula -Attempted to wean off oxygen completely patient desaturated with ambulation on room to 87% on room air -Cough   is not worse -Dyspnea at rest mostly resolved dyspnea on exertion persist  Objective: Vitals:   08/16/22 0400 08/16/22 0542 08/16/22 1318 08/16/22 1324  BP:  (!) 160/86 (!) 156/91  (!) 151/87  Pulse: 85 90  76  Resp: 18     Temp: 98.2 F (36.8 C)   98.2 F (36.8 C)  TempSrc: Oral   Oral  SpO2: 95%  96% 100%  Weight:      Height:        Intake/Output Summary (Last 24 hours) at 08/16/2022 1849 Last data filed at 08/16/2022 1801 Gross per 24 hour  Intake 720 ml  Output --  Net 720 ml   Filed Weights   08/14/22 1331 08/14/22 2022  Weight: 81.6 kg 80.8 kg    Physical Exam  Gen:- Awake Alert, dyspnea on exertion persist, some conversational dyspnea HEENT:- [place].AT, No sclera icterus Nose- [place] 2L/min Neck-Supple Neck,No JVD,.  Lungs-loose breath sounds with scattered wheezes bilaterally,  CV- S1, S2 normal, regular  Abd-  +ve B.Sounds, Abd Soft, No tenderness,    Extremity/Skin:- No  edema, pedal pulses present  Psych-affect is appropriate, oriented x3 Neuro-no new focal deficits, no tremors  Data Reviewed: I have personally reviewed following labs and imaging studies  CBC: Recent Labs  Lab 08/14/22 1454 08/15/22 0402  WBC 5.0 4.6  NEUTROABS 4.2  --   HGB 9.7* 9.1*  HCT 31.1* 28.9*  MCV 105.8* 105.1*  PLT 103* 086*   Basic Metabolic Panel: Recent Labs  Lab 08/14/22 1454 08/15/22 0402  NA 135 139  K 4.0 4.9  CL 97* 103  CO2 27 25  GLUCOSE 149* 424*  BUN 25* 27*  CREATININE 1.63* 1.60*  CALCIUM 8.4* 8.1*   GFR: Estimated Creatinine Clearance: 31.1 mL/min (A) (by C-G formula based on SCr of 1.6 mg/dL (H)). Liver Function Tests: Recent Labs  Lab 08/14/22 1454  AST 26  ALT 30  ALKPHOS 79  BILITOT 0.5  PROT 7.0  ALBUMIN 3.8   Recent Results (from the past 240 hour(s))  Resp panel by RT-PCR (RSV, Flu A&B, Covid) Anterior Nasal Swab     Status: Abnormal   Collection Time: 08/14/22  3:00 PM   Specimen: Anterior Nasal Swab  Result Value Ref Range Status   SARS Coronavirus 2 by RT PCR NEGATIVE NEGATIVE Final    Comment: (NOTE) SARS-CoV-2 target nucleic acids are NOT DETECTED.  The SARS-CoV-2 RNA is  generally detectable in upper respiratory specimens during the acute phase of infection. The lowest concentration of SARS-CoV-2 viral copies this assay can detect is 138 copies/mL. A negative result does not preclude SARS-Cov-2 infection and should not be used as the sole basis for treatment or other patient management decisions. A negative result may occur with  improper specimen collection/handling, submission of specimen other than nasopharyngeal swab, presence of viral mutation(s) within the areas targeted by this assay, and inadequate number of viral copies(<138 copies/mL). A negative result must be combined with clinical observations, patient history, and epidemiological information. The expected result is Negative.  Fact Sheet for Patients:  EntrepreneurPulse.com.au  Fact Sheet for Healthcare Providers:  IncredibleEmployment.be  This test is no t yet approved or cleared by the [place] FDA and  has been authorized for detection and/or diagnosis of SARS-CoV-2 by FDA under an Emergency Use Authorization (EUA). This EUA will remain  in effect (meaning this test can be used) for the duration of the COVID-19 declaration under Section 564(b)(1) of the Act, 21 U.S.C.section 360bbb-3(b)(1), unless the authorization is terminated  or revoked sooner.       Influenza A by PCR NEGATIVE NEGATIVE Final   Influenza B by PCR NEGATIVE NEGATIVE Final    Comment: (NOTE) The Xpert Xpress SARS-CoV-2/FLU/RSV plus assay is intended as an aid in the diagnosis of influenza from Nasopharyngeal swab specimens and should not be used as a sole basis for treatment. Nasal washings and aspirates are unacceptable for Xpert Xpress SARS-CoV-2/FLU/RSV testing.  Fact Sheet for Patients: EntrepreneurPulse.com.au  Fact Sheet for Healthcare Providers: IncredibleEmployment.be  This test is not yet approved or cleared by the [place] FDA and has been authorized for detection and/or diagnosis of SARS-CoV-2 by FDA under an Emergency Use Authorization (EUA). This EUA will remain in effect (meaning this test can be used) for the duration of the COVID-19 declaration under Section 564(b)(1) of the Act, 21 U.S.C. section 360bbb-3(b)(1), unless the authorization is terminated or revoked.     Resp Syncytial Virus by PCR POSITIVE (A) NEGATIVE Final    Comment: (NOTE) Fact Sheet for Patients: EntrepreneurPulse.com.au  Fact Sheet for Healthcare Providers: IncredibleEmployment.be  This test is not yet approved or cleared by the [place] FDA and has been authorized for detection and/or diagnosis of SARS-CoV-2 by FDA under an Emergency Use Authorization (EUA). This EUA will remain in effect (meaning this test can be used) for the duration of the COVID-19 declaration under Section 564(b)(1) of the Act, 21 U.S.C. section 360bbb-3(b)(1), unless the authorization is terminated or revoked.  Performed at [facility], [address] [place], [place] [address]     Radiology Studies: No results found.  Scheduled Meds:  acyclovir  400 mg Oral BID   apixaban  2.5 mg Oral BID   dextromethorphan-guaiFENesin  1 tablet Oral BID   escitalopram  20 mg Oral Daily   insulin aspart  0-15 Units Subcutaneous TID WC   insulin aspart  0-5 Units Subcutaneous QHS   insulin glargine-yfgn  10 Units Subcutaneous Daily   ipratropium-albuterol  3 mL Nebulization Q6H   methylPREDNISolone (SOLU-MEDROL) injection  40 mg Intravenous Q12H   pantoprazole  40 mg Oral Daily   rosuvastatin  10 mg Oral Daily   Continuous Infusions:   LOS: 1 day   Roxan Hockey M.D on 08/16/2022 at 6:49 PM  Go to [email or web address] - for contact info  [facility] - Office  [phone]  If 7PM-7AM, please contact night-coverage [email or web address] 08/16/2022, 6:49 PM

## 2022-08-16 NOTE — Progress Notes (Signed)
Pt remains on 3L this shift.  Productive cough with yellow sputum, resp wheezes present.  NSR on monitor.  Xanax at hs. Ambulatory in room.

## 2022-08-17 ENCOUNTER — Ambulatory Visit: Payer: Medicare Other

## 2022-08-17 DIAGNOSIS — J205 Acute bronchitis due to respiratory syncytial virus: Secondary | ICD-10-CM | POA: Diagnosis not present

## 2022-08-17 DIAGNOSIS — I10 Essential (primary) hypertension: Secondary | ICD-10-CM

## 2022-08-17 LAB — RENAL FUNCTION PANEL
Albumin: 3.2 g/dL — ABNORMAL LOW (ref 3.5–5.0)
Anion gap: 7 (ref 5–15)
BUN: 33 mg/dL — ABNORMAL HIGH (ref 8–23)
CO2: 26 mmol/L (ref 22–32)
Calcium: 8.1 mg/dL — ABNORMAL LOW (ref 8.9–10.3)
Chloride: 104 mmol/L (ref 98–111)
Creatinine, Ser: 1.37 mg/dL — ABNORMAL HIGH (ref 0.44–1.00)
GFR, Estimated: 41 mL/min — ABNORMAL LOW (ref >60)
Glucose, Bld: 220 mg/dL — ABNORMAL HIGH (ref 70–99)
Phosphorus: 2.8 mg/dL (ref 2.5–4.6)
Potassium: 5.1 mmol/L (ref 3.5–5.1)
Sodium: 137 mmol/L (ref 135–145)

## 2022-08-17 LAB — GLUCOSE, CAPILLARY
Glucose-Capillary: 248 mg/dL — ABNORMAL HIGH (ref 70–99)
Glucose-Capillary: 279 mg/dL — ABNORMAL HIGH (ref 70–99)

## 2022-08-17 LAB — TROPONIN I (HIGH SENSITIVITY): Troponin I (High Sensitivity): 8 ng/L (ref <18)

## 2022-08-17 MED ORDER — DM-GUAIFENESIN ER 30-600 MG PO TB12: 1.0000 | ORAL_TABLET | Freq: Two times a day (BID) | ORAL | 0 refills | Status: DC

## 2022-08-17 MED ORDER — COMPRESSOR/NEBULIZER MISC: 1.0000 [IU] | Freq: Three times a day (TID) | 0 refills | Status: DC | PRN

## 2022-08-17 MED ORDER — ALBUTEROL SULFATE (2.5 MG/3ML) 0.083% IN NEBU: 2.5000 mg | INHALATION_SOLUTION | RESPIRATORY_TRACT | 2 refills | Status: DC | PRN

## 2022-08-17 MED ORDER — ALUM & MAG HYDROXIDE-SIMETH 200-200-20 MG/5ML PO SUSP
30.0000 mL | ORAL | Status: DC | PRN
  Administered 2022-08-17: 30 mL via ORAL
  Filled 2022-08-17: qty 30

## 2022-08-17 MED ORDER — PREDNISONE 20 MG PO TABS: 40.0000 mg | ORAL_TABLET | Freq: Every day | ORAL | 0 refills | Status: AC

## 2022-08-17 MED ORDER — ALBUTEROL SULFATE HFA 108 (90 BASE) MCG/ACT IN AERS: 2.0000 | INHALATION_SPRAY | RESPIRATORY_TRACT | 2 refills | Status: DC | PRN

## 2022-08-17 NOTE — TOC Transition Note (Signed)
Transition of Care [facility]) - CM/SW Discharge Note   Patient Details  Name: Alexandra Price MRN: [id number] Date of Birth: September 02, 1945  [facility] CM/SW Contact:  Iona Beard, [place] Phone Number: 08/17/2022, 12:51 PM  Clinical Narrative:    CSW updated that pt will need nebulizer at D/C. CSW spoke to pt who states that she does not have a preference in company. CSW spoke to Eleele with [employer] who is able to get neb ordered for pt. TOC signing off.   Final next level of care: Home/Self Care Barriers to Discharge: Barriers Resolved   Patient Goals and CMS Choice CMS Medicare.gov Compare Post Acute Care list provided to:: Patient Choice offered to / list presented to : Patient  Discharge Placement                         Discharge Plan and Services Additional resources added to the After Visit Summary for   In-house Referral: Clinical Social Work Discharge Planning Services: CM Consult            DME Arranged: Nebulizer/meds DME Agency: [facility] Date DME Agency Contacted: 08/17/22   Representative spoke with at DME Agency: [place] Determinants of Health ([place]) Interventions SDOH Screenings   Food Insecurity: No Food Insecurity (08/14/2022)  Housing: Low Risk  (08/14/2022)  Transportation Needs: No Transportation Needs (08/14/2022)  Utilities: Not At Risk (08/14/2022)  Alcohol Screen: Low Risk  (07/06/2020)  Depression (PHQ2-9): Low Risk  (07/06/2020)  Financial Resource Strain: Low Risk  (07/06/2020)  Physical Activity: Inactive (07/06/2020)  Social Connections: Moderately Isolated (07/06/2020)  Stress: No Stress Concern Present (07/06/2020)  Tobacco Use: Low Risk  (08/14/2022)     Readmission Risk Interventions    08/16/2022    1:48 PM  Readmission Risk Prevention Plan  Transportation Screening Complete  HRI or Home Care Consult Complete  Social Work Consult for [place] Planning/Counseling Complete  Palliative  Care Screening Not Applicable  Medication Review Press photographer) Complete

## 2022-08-17 NOTE — Discharge Summary (Signed)
Alexandra Price, is a 76 y.o. female  DOB [birth year]  MRN [id number].  Admission date:  08/14/2022  Admitting Physician  Roxan Hockey, MD  Discharge Date:  08/17/2022   Primary MD  [name], MD  Recommendations for primary care physician for things to follow:   1)You are taking Eliquis/apixaban which is a blood thinner so please Avoid ibuprofen/Advil/Aleve/Motrin/Goody Powders/Naproxen/BC powders/Meloxicam/Diclofenac/Indomethacin and other Nonsteroidal anti-inflammatory medications as these will make you more likely to bleed and can cause stomach ulcers, can also cause Kidney problems.   2)Please follow-up with hematologist/oncologist Dr. [name], MD -Address: inside [facility] (4th Floor), [place], [place], [place] [address] Phone: [phone]  3) repeat CBC and BMP blood test within a week  Admission Diagnosis  Acute bronchitis [J20.9] RSV (acute bronchiolitis due to respiratory syncytial virus) [J21.0] Acute respiratory failure with hypoxia ([place]) [J96.01] Multiple myeloma not having achieved remission ([place]) [C90.00] On antineoplastic chemotherapy [Z79.899] Stage 3b chronic kidney disease ([place]) [N18.32]   Discharge Diagnosis  Acute bronchitis [J20.9] RSV (acute bronchiolitis due to respiratory syncytial virus) [J21.0] Acute respiratory failure with hypoxia ([place]) [J96.01] Multiple myeloma not having achieved remission ([place]) [C90.00] On antineoplastic chemotherapy [Z79.899] Stage 3b chronic kidney disease ([place]) [N18.32]    Principal Problem:   Acute bronchitis Active Problems:   Chest pain   Acute respiratory failure with hypoxia (HCC)   RSV bronchitis   Multiple myeloma without remission (HCC)   CKD (chronic kidney disease) stage 4, GFR 15-29 ml/min (HCC)   HTN (hypertension)   Pulmonary embolism ([place])      Past Medical History:  Diagnosis Date    Anxiety    Chronic kidney disease    Depression    Hypertension    Multiple myeloma ([place])    multiple myeloma   Pre-diabetes     Past Surgical History:  Procedure Laterality Date   ABDOMINAL HYSTERECTOMY     total   APPENDECTOMY     BALLOON DILATION N/A 05/28/2022   Procedure: BALLOON DILATION;  Surgeon: [name], DO;  [facility];  Service: Endoscopy;  Laterality: N/A;   BIOPSY  05/28/2022   Procedure: BIOPSY;  Surgeon: [name], DO;  [facility];  Service: Endoscopy;;   COLONOSCOPY WITH PROPOFOL N/A 12/25/2021   Procedure: COLONOSCOPY WITH PROPOFOL;  Surgeon: [name], DO;  [facility];  Service: Endoscopy;  Laterality: N/A;  2:30pm   ESOPHAGOGASTRODUODENOSCOPY (EGD) WITH PROPOFOL N/A 05/28/2022   Procedure: ESOPHAGOGASTRODUODENOSCOPY (EGD) WITH PROPOFOL;  Surgeon: [name], DO;  [facility];  Service: Endoscopy;  Laterality: N/A;  10:00am, asa 3   LAPAROSCOPIC APPENDECTOMY N/A 05/20/2018   Procedure: APPENDECTOMY LAPAROSCOPIC;  Surgeon: [name], MD;  Location: [facility];  Service: General;  Laterality: N/A;   POLYPECTOMY  12/25/2021   Procedure: POLYPECTOMY;  Surgeon: [name], DO;  [facility];  Service: Endoscopy;;   PORTACATH PLACEMENT Right 04/14/2018   Procedure: INSERTION PORT-A-CATH;  Surgeon: Arnoldo Morale,  Elta Guadeloupe, MD;  Location: [facility];  Service: General;  Laterality: Right;    HPI  from the history and physical done on the day of admission:    Chief Complaint: Difficulty Breathing   HPI: Alexandra Price is a 76 y.o. female with medical history significant for multiple myeloma, CKD 4, hypertension, depression, pulmonary embolism. Patient presented to the ED with complaints of 2 weeks of feeling ill.  She reports difficulty breathing, cough productive of brownish sputum, and wheezing.  She reports diarrhea also-up to 8 times a day.  No vomiting.  She reports chest pain to right  lower rib and mid chest.  She is on Eliquis for PE, and has been compliant.  No lower extremity swelling.   ED Course: O2 sats dropped to 68% on room air when patient ambulated to the bathroom.  At rest sats greater than 92%.  Tmax 99.3.  Heart rate 91-105.  Respirate rate 18-30.  Blood pressure systolic 626R to 485I. Chest x-ray negative for acute abnormality. 125 mg Solu-Medrol, DuoNebs given.  500 mill bolus given.     Review of Systems: As per HPI all other systems reviewed and negative.     Hospital Course:    Brief Narrative:  76 y.o. female with medical history significant for multiple myeloma, CKD 4, hypertension, depression, pulmonary embolism admitted on 08/15/2022 with acute hypoxic respiratory failure secondary to RSV bronchitis/bronchiolitis     -Assessment and Plan: 1)acute hypoxic respiratory failure secondary to RSV bronchitis/bronchiolitis -Patient has been weaned off oxygen --Hypoxia and respiratory distress has resolved  -Patient ambulated around her  O2 sat 93 to 95% on room air post ambulation  -Patient was compliant with Eliquis PTA due to history of prior PE -Continue bronchodilators mucolytics and steroids   2)Chest pain-reproducible on palpation left sternal costal area Likely musculoskeletal in etiology in the setting of excessive coughing -Troponin  and EKG are nonacute -Chest x-ray without acute findings -No chest pains with ambulation   3)H/o Pulmonary embolism  -Patient reports compliance with Eliquis -Continue same   4)CKD stage -IV -Renal function appears to be close to baseline at this time - renally adjust medications, avoid nephrotoxic agents / dehydration  / hypotension   5)Multiple myeloma without remission ([place]) Follows with [place].  Currently on chemotherapy and once weekly dexamethasone.  Recent scans shows numerous lucent bone lesions compatible with multiple myeloma, also has compression fractures. -c/n home pain medications    6) chronic anemia--- stable -Suspect related to #4 #5 above -Watch closely as patient is on Eliquis -No bleeding concerns at this time   Disposition: The patient is from: Home              Anticipated d/c is to: Home                   Discharge Condition: Stable without hypoxia  Follow UP-  Follow-up Information     [name], MD. Schedule an appointment as soon as possible for a visit.   Specialty: Hematology Contact information: [place] [address] [phone]                Diet and Activity recommendation:  As advised  Discharge Instructions    Discharge Instructions     Call MD for:  difficulty breathing, headache or visual disturbances   Complete by: As directed    Call MD for:  persistant dizziness or light-headedness   Complete by: As directed    Call  MD for:  persistant nausea and vomiting   Complete by: As directed    Call MD for:  temperature >100.4   Complete by: As directed    Diet - low sodium heart healthy   Complete by: As directed    Discharge instructions   Complete by: As directed    1)You are taking Eliquis/apixaban which is a blood thinner so please Avoid ibuprofen/Advil/Aleve/Motrin/Goody Powders/Naproxen/BC powders/Meloxicam/Diclofenac/Indomethacin and other Nonsteroidal anti-inflammatory medications as these will make you more likely to bleed and can cause stomach ulcers, can also cause Kidney problems.   2)Please follow-up with hematologist/oncologist Dr. [name], MD -Address: inside [facility] (4th Floor), [place], [place], [place] [address] Phone: [phone]  3) repeat CBC and BMP blood test within a week   For home use only DME Nebulizer machine   Complete by: As directed    Patient needs a nebulizer to treat with the following condition: RSV (acute bronchiolitis due to respiratory syncytial virus)   Length of Need: Lifetime   Increase activity slowly   Complete by: As directed           Discharge Medications     Allergies as of 08/17/2022       Reactions   Ciprofloxacin Anaphylaxis   Amoxicillin Other (See Comments)   unknown   Morphine And Related Nausea And Vomiting   Augmentin [amoxicillin-pot Clavulanate] Other (See Comments)   Headache, insomnia        Medication List     STOP taking these medications    ibuprofen 200 MG tablet Commonly known as: ADVIL   Zithromax 500 MG tablet Generic drug: azithromycin       TAKE these medications    acetaminophen 325 MG tablet Commonly known as: TYLENOL Take 650 mg by mouth as needed for moderate pain (prior to infusion).   acyclovir 400 MG tablet Commonly known as: ZOVIRAX Take 1 tablet (400 mg total) by mouth 2 (two) times daily.   albuterol 108 (90 Base) MCG/ACT inhaler Commonly known as: Ventolin HFA Inhale 2 puffs into the lungs every 4 (four) hours as needed for wheezing or shortness of breath. What changed: See the new instructions.   albuterol (2.5 MG/3ML) 0.083% nebulizer solution Commonly known as: PROVENTIL Take 3 mLs (2.5 mg total) by nebulization every 4 (four) hours as needed for wheezing or shortness of breath. What changed: You were already taking a medication with the same name, and this prescription was added. Make sure you understand how and when to take each.   ALPRAZolam 0.5 MG tablet Commonly known as: XANAX TAKE 1 TABLET BY MOUTH DURING THE DAY AS NEEDED FOR ANXIETY AND 2 TABLETS AT BEDTIME AS NEEDED FOR ANXIETY/INSOMNIA   CALCIUM 1000 + D PO Take 1,000 mg by mouth daily.   cetirizine 10 MG tablet Commonly known as: ZYRTEC Take 10 mg by mouth daily.   Compressor/Nebulizer Misc 1 Units by Does not apply route 3 (three) times daily as needed.   dexamethasone 4 MG tablet Commonly known as: DECADRON Take 4 mg by mouth See admin instructions. 5 tablets 30 min prior to infusion   dexAMETHasone 20 MG Tabs Take 1 tablet by mouth as directed. Take 1 tablet 30  minutes prior to infusions   dextromethorphan-guaiFENesin 30-600 MG 12hr tablet Commonly known as: MUCINEX DM Take 1 tablet by mouth 2 (two) times daily.   diphenhydrAMINE 25 MG tablet Commonly known as: BENADRYL Take 50 mg by mouth as needed (prior to infusion).  Eliquis 2.5 MG Tabs tablet Generic drug: apixaban Take 2.5 mg by mouth 2 (two) times daily.   escitalopram 20 MG tablet Commonly known as: LEXAPRO Take 20 mg by mouth daily.   HYDROcodone-acetaminophen 10-325 MG tablet Commonly known as: NORCO Take 1 tablet by mouth every 6 (six) hours as needed.   methocarbamol 500 MG tablet Commonly known as: ROBAXIN Take 1 tablet (500 mg total) by mouth every 8 (eight) hours as needed for muscle spasms (back pain).   montelukast 10 MG tablet Commonly known as: SINGULAIR TAKE 1 TABLET BY MOUTH AS DIRECTED 30 MINUTES PRIOR TO TREATMENT What changed: See the new instructions.   Multi Vitamin Tabs Take 1 tablet by mouth daily.   Oxycodone HCl 10 MG Tabs Take 1 tablet (10 mg total) by mouth every 6 (six) hours as needed. What changed: Another medication with the same name was removed. Continue taking this medication, and follow the directions you see here.   pantoprazole 40 MG tablet Commonly known as: PROTONIX Take 1 tablet (40 mg total) by mouth daily.   polyethylene glycol 17 g packet Commonly known as: MIRALAX / GLYCOLAX Take 17 g by mouth daily as needed for moderate constipation.   predniSONE 20 MG tablet Commonly known as: DELTASONE Take 2 tablets (40 mg total) by mouth daily with breakfast for 5 days.   prochlorperazine 10 MG tablet Commonly known as: COMPAZINE Take 1 tablet (10 mg total) by mouth every 6 (six) hours as needed for nausea or vomiting.   rosuvastatin 10 MG tablet Commonly known as: CRESTOR Take 10 mg by mouth daily.   zolpidem 10 MG tablet Commonly known as: AMBIEN Take 1 tablet (10 mg total) by mouth at bedtime as needed for sleep.                Durable Medical Equipment  (From admission, onward)           Start     Ordered   08/17/22 0000  For home use only DME Nebulizer machine       Question Answer Comment  Patient needs a nebulizer to treat with the following condition RSV (acute bronchiolitis due to respiratory syncytial virus)   Length of Need Lifetime      08/17/22 1111            Major procedures and Radiology Reports - PLEASE review detailed and final reports for all details, in brief -   DG Chest 2 View  Result Date: 08/14/2022 CLINICAL DATA:  Two-week history of productive cough, congestion, and chills EXAM: CHEST - 2 VIEW COMPARISON:  Radiograph of the ribs dated 03/27/2021 FINDINGS: Right chest wall port tip projects over the SVC. Normal lung volumes. No focal consolidations. No pleural effusion or pneumothorax. The heart size and mediastinal contours are within normal limits. Old healed fracture of the left clavicle and left posterior ribs. Unchanged T11 compression deformity. IMPRESSION: 1. No active cardiopulmonary disease. 2. Unchanged T11 compression deformity. Electronically Signed   By: Darrin Nipper M.D.   On: 08/14/2022 14:09   CT Thoracic Spine Wo Contrast  Result Date: 08/04/2022 CLINICAL DATA:  Acute on chronic back pain. History of multiple myeloma EXAM: CT THORACIC AND LUMBAR SPINE WITHOUT CONTRAST TECHNIQUE: Multidetector CT imaging of the thoracic and lumbar spine was performed without contrast. Multiplanar CT image reconstructions were also generated. RADIATION DOSE REDUCTION: This exam was performed according to the departmental dose-optimization program which includes automated exposure control, adjustment of the mA and/or kV  according to patient size and/or use of iterative reconstruction technique. COMPARISON:  , MRI PET-CT 05/24/2022 11/29/2020 FINDINGS: CT THORACIC SPINE FINDINGS Alignment: Exaggerated thoracic kyphosis.  No static listhesis. Vertebrae: Diffuse osseous  demineralization. Heterogeneous appearance of the osseous structures with scattered areas of lucency likely secondary to known multiple myeloma. New superior endplate compression fracture of the T2 vertebral body with approximately 30% vertebral body height loss. Chronic mild superior endplate compression fracture of T11, not significantly changed. Focal lytic lesion within the left lamina of T11. Numerous lytic lesions throughout the sternum and manubrium. Paraspinal and other soft tissues: No acute abnormality. Disc levels: Mild multilevel thoracic spondylosis. No findings to suggest foraminal or canal stenosis by CT. CT LUMBAR SPINE FINDINGS Segmentation: 5 lumbar type vertebrae. Alignment: Normal. Vertebrae: Diffuse osseous demineralization. Heterogeneous appearance of the osseous structures with numerous scattered areas of lucency compatible with known myeloma. Chronic superior endplate compression fracture at L4 is unchanged. No acute fractures Paraspinal and other soft tissues: Hepatic steatosis. Aortic atherosclerosis. No acute findings. Disc levels: Degenerative disc disease most notably at L1-2 and L4-5. Mild lower lumbar facet arthropathy. Suspect mild canal stenosis at L4-5 with probable mild foraminal stenosis bilaterally at L4-5 and on the left at L3-4 and L5-S1. IMPRESSION: 1. New superior endplate compression fracture of the T2 vertebral body with approximately 30% vertebral body height loss. 2. Chronic superior endplate compression fractures of T11 and L4, unchanged. 3. Heterogeneous appearance of the osseous structures with numerous scattered areas of lucency compatible with known multiple myeloma. 4. Hepatic steatosis. 5. Aortic atherosclerosis (ICD10-I70.0). Electronically Signed   By: Davina Poke D.O.   On: 08/04/2022 17:17   CT L-SPINE NO CHARGE  Result Date: 08/04/2022 CLINICAL DATA:  Acute on chronic back pain. History of multiple myeloma EXAM: CT THORACIC AND LUMBAR SPINE WITHOUT  CONTRAST TECHNIQUE: Multidetector CT imaging of the thoracic and lumbar spine was performed without contrast. Multiplanar CT image reconstructions were also generated. RADIATION DOSE REDUCTION: This exam was performed according to the departmental dose-optimization program which includes automated exposure control, adjustment of the mA and/or kV according to patient size and/or use of iterative reconstruction technique. COMPARISON:  , MRI PET-CT 05/24/2022 11/29/2020 FINDINGS: CT THORACIC SPINE FINDINGS Alignment: Exaggerated thoracic kyphosis.  No static listhesis. Vertebrae: Diffuse osseous demineralization. Heterogeneous appearance of the osseous structures with scattered areas of lucency likely secondary to known multiple myeloma. New superior endplate compression fracture of the T2 vertebral body with approximately 30% vertebral body height loss. Chronic mild superior endplate compression fracture of T11, not significantly changed. Focal lytic lesion within the left lamina of T11. Numerous lytic lesions throughout the sternum and manubrium. Paraspinal and other soft tissues: No acute abnormality. Disc levels: Mild multilevel thoracic spondylosis. No findings to suggest foraminal or canal stenosis by CT. CT LUMBAR SPINE FINDINGS Segmentation: 5 lumbar type vertebrae. Alignment: Normal. Vertebrae: Diffuse osseous demineralization. Heterogeneous appearance of the osseous structures with numerous scattered areas of lucency compatible with known myeloma. Chronic superior endplate compression fracture at L4 is unchanged. No acute fractures Paraspinal and other soft tissues: Hepatic steatosis. Aortic atherosclerosis. No acute findings. Disc levels: Degenerative disc disease most notably at L1-2 and L4-5. Mild lower lumbar facet arthropathy. Suspect mild canal stenosis at L4-5 with probable mild foraminal stenosis bilaterally at L4-5 and on the left at L3-4 and L5-S1. IMPRESSION: 1. New superior endplate compression  fracture of the T2 vertebral body with approximately 30% vertebral body height loss. 2. Chronic superior endplate compression fractures of T11  and L4, unchanged. 3. Heterogeneous appearance of the osseous structures with numerous scattered areas of lucency compatible with known multiple myeloma. 4. Hepatic steatosis. 5. Aortic atherosclerosis (ICD10-I70.0). Electronically Signed   By: Davina Poke D.O.   On: 08/04/2022 17:17   CT Abdomen Pelvis W Contrast  Result Date: 08/04/2022 CLINICAL DATA:  Acute abdominal pain. Multiple myeloma. * Tracking Code: BO * EXAM: CT ABDOMEN AND PELVIS WITH CONTRAST TECHNIQUE: Multidetector CT imaging of the abdomen and pelvis was performed using the standard protocol following bolus administration of intravenous contrast. RADIATION DOSE REDUCTION: This exam was performed according to the departmental dose-optimization program which includes automated exposure control, adjustment of the mA and/or kV according to patient size and/or use of iterative reconstruction technique. CONTRAST:  33m OMNIPAQUE IOHEXOL 300 MG/ML  SOLN COMPARISON:  Noncontrast CT on 08/02/2022 FINDINGS: Lower Chest: No acute findings. Hepatobiliary: No hepatic masses identified. Moderate diffuse hepatic steatosis is demonstrated. Gallbladder is unremarkable. No evidence of biliary ductal dilatation. Pancreas:  No mass or inflammatory changes. Spleen: Within normal limits in size and appearance. Adrenals/Urinary Tract: No suspicious masses identified. No evidence of ureteral calculi or hydronephrosis. Stomach/Bowel: No evidence of obstruction, inflammatory process or abnormal fluid collections. Vascular/Lymphatic: No pathologically enlarged lymph nodes. No acute vascular findings. Aortic atherosclerotic calcification incidentally noted. Reproductive: Prior hysterectomy noted. Adnexal regions are unremarkable in appearance. Other:  None. Musculoskeletal: Diffuse permeative osteopenia is again demonstrated,  consistent with patient's known history of multiple myeloma. Multiple old left posterior rib fracture deformities are again seen. Old compression fracture deformities of the T11 and L4 vertebral bodies show no significant change. IMPRESSION: No acute findings within the abdomen or pelvis. Moderate hepatic steatosis. Diffuse permeative osteopenia, consistent with patient's known history of multiple myeloma. No acute fractures identified. Stable old compression deformities of T11 and L4 vertebral bodies, and old left posterior rib fractures. Aortic Atherosclerosis (ICD10-I70.0). Electronically Signed   By: JMarlaine HindM.D.   On: 08/04/2022 15:06   CT Renal Stone Study  Result Date: 08/02/2022 CLINICAL DATA:  Right-sided flank pain for several hours EXAM: CT ABDOMEN AND PELVIS WITHOUT CONTRAST TECHNIQUE: Multidetector CT imaging of the abdomen and pelvis was performed following the standard protocol without IV contrast. RADIATION DOSE REDUCTION: This exam was performed according to the departmental dose-optimization program which includes automated exposure control, adjustment of the mA and/or kV according to patient size and/or use of iterative reconstruction technique. COMPARISON:  05/24/2022 PET-CT FINDINGS: Lower chest: Calcified granuloma is noted in the right middle lobe. No focal infiltrate is seen. Hepatobiliary: Fatty infiltration of the liver is noted. Gallbladder is within normal limits. Pancreas: Unremarkable. No pancreatic ductal dilatation or surrounding inflammatory changes. Spleen: Normal in size without focal abnormality. Adrenals/Urinary Tract: Adrenal glands are within normal limits. Kidneys are well visualized bilaterally. No renal calculi or obstructive changes are seen. The ureters are within normal limits. The bladder is decompressed. Stomach/Bowel: Fecal material is noted throughout the colon consistent with a mild degree of constipation. The appendix has been surgically removed. Small  bowel and stomach are within normal limits. Vascular/Lymphatic: Aortic atherosclerosis. No enlarged abdominal or pelvic lymph nodes. Reproductive: Status post hysterectomy. No adnexal masses. Other: No abdominal wall hernia or abnormality. No abdominopelvic ascites. Musculoskeletal: Multiple lytic lesions are noted scattered throughout the bony structures consistent with the given clinical history of myeloma. IMPRESSION: No renal calculi or obstructive changes are noted. Mild colonic constipation is noted. Changes of multiple myeloma. Electronically Signed   By: MLinus MakoD.  On: 08/02/2022 02:45    Micro Results   Recent Results (from the past 240 hour(s))  Resp panel by RT-PCR (RSV, Flu A&B, Covid) Anterior Nasal Swab     Status: Abnormal   Collection Time: 08/14/22  3:00 PM   Specimen: Anterior Nasal Swab  Result Value Ref Range Status   SARS Coronavirus 2 by RT PCR NEGATIVE NEGATIVE Final    Comment: (NOTE) SARS-CoV-2 target nucleic acids are NOT DETECTED.  The SARS-CoV-2 RNA is generally detectable in upper respiratory specimens during the acute phase of infection. The lowest concentration of SARS-CoV-2 viral copies this assay can detect is 138 copies/mL. A negative result does not preclude SARS-Cov-2 infection and should not be used as the sole basis for treatment or other patient management decisions. A negative result may occur with  improper specimen collection/handling, submission of specimen other than nasopharyngeal swab, presence of viral mutation(s) within the areas targeted by this assay, and inadequate number of viral copies(<138 copies/mL). A negative result must be combined with clinical observations, patient history, and epidemiological information. The expected result is Negative.  Fact Sheet for Patients:  EntrepreneurPulse.com.au  Fact Sheet for Healthcare Providers:  IncredibleEmployment.be  This test is no t yet approved  or cleared by the [place] FDA and  has been authorized for detection and/or diagnosis of SARS-CoV-2 by FDA under an Emergency Use Authorization (EUA). This EUA will remain  in effect (meaning this test can be used) for the duration of the COVID-19 declaration under Section 564(b)(1) of the Act, 21 U.S.C.section 360bbb-3(b)(1), unless the authorization is terminated  or revoked sooner.       Influenza A by PCR NEGATIVE NEGATIVE Final   Influenza B by PCR NEGATIVE NEGATIVE Final    Comment: (NOTE) The Xpert Xpress SARS-CoV-2/FLU/RSV plus assay is intended as an aid in the diagnosis of influenza from Nasopharyngeal swab specimens and should not be used as a sole basis for treatment. Nasal washings and aspirates are unacceptable for Xpert Xpress SARS-CoV-2/FLU/RSV testing.  Fact Sheet for Patients: EntrepreneurPulse.com.au  Fact Sheet for Healthcare Providers: IncredibleEmployment.be  This test is not yet approved or cleared by the [place] FDA and has been authorized for detection and/or diagnosis of SARS-CoV-2 by FDA under an Emergency Use Authorization (EUA). This EUA will remain in effect (meaning this test can be used) for the duration of the COVID-19 declaration under Section 564(b)(1) of the Act, 21 U.S.C. section 360bbb-3(b)(1), unless the authorization is terminated or revoked.     Resp Syncytial Virus by PCR POSITIVE (A) NEGATIVE Final    Comment: (NOTE) Fact Sheet for Patients: EntrepreneurPulse.com.au  Fact Sheet for Healthcare Providers: IncredibleEmployment.be  This test is not yet approved or cleared by the [place] FDA and has been authorized for detection and/or diagnosis of SARS-CoV-2 by FDA under an Emergency Use Authorization (EUA). This EUA will remain in effect (meaning this test can be used) for the duration of the COVID-19 declaration under Section 564(b)(1) of the  Act, 21 U.S.C. section 360bbb-3(b)(1), unless the authorization is terminated or revoked.  Performed at [facility], [address], [place], [place] [address]     Today   Subjective    Darice Vicario today has no new complaints  No fever  Or chills - -Hypoxia and respiratory distress has resolved  -Patient ambulated around the unit and O2 sat 93 to 95% on room air post ambulation           Patient has been seen and examined  prior to discharge   Objective   Blood pressure (!) 168/91, pulse 81, temperature 98.7 F (37.1 C), resp. rate 17, height _0  (1.6 m), weight 80.8 kg, SpO2 94 %.   Intake/Output Summary (Last 24 hours) at 08/17/2022 1448 Last data filed at 08/16/2022 1801 Gross per 24 hour  Intake 240 ml  Output --  Net 240 ml    Exam Gen:- Awake Alert, no acute distress  HEENT:- [place].AT, No sclera icterus Neck-Supple Neck,No JVD,.  Lungs-improved air movement, no wheezing  CV- S1, S2 normal, regular Abd-  +ve B.Sounds, Abd Soft, No tenderness,    Extremity/Skin:- No  edema,   good pulses Psych-affect is appropriate, oriented x3 Neuro-no new focal deficits, no tremors    Data Review   CBC w Diff:  Lab Results  Component Value Date   WBC 4.6 08/15/2022   HGB 9.1 (L) 08/15/2022   HGB 9.4 (L) 12/15/2018   HCT 28.9 (L) 08/15/2022   PLT 101 (L) 08/15/2022   LYMPHOPCT 6 08/14/2022   BANDSPCT 5 10/13/2020   MONOPCT 9 08/14/2022   EOSPCT 0 08/14/2022   BASOPCT 0 08/14/2022    CMP:  Lab Results  Component Value Date   NA 137 08/17/2022   K 5.1 08/17/2022   CL 104 08/17/2022   CO2 26 08/17/2022   BUN 33 (H) 08/17/2022   CREATININE 1.37 (H) 08/17/2022   PROT 7.0 08/14/2022   ALBUMIN 3.2 (L) 08/17/2022   BILITOT 0.5 08/14/2022   ALKPHOS 79 08/14/2022   AST 26 08/14/2022   ALT 30 08/14/2022  .  Total Discharge time is about 33 minutes  Roxan Hockey M.D on 08/17/2022 at 2:48 PM  Go to [email or web address] -  for contact info  [facility] -  Office  [phone]

## 2022-08-17 NOTE — Inpatient Diabetes Management (Incomplete)
Inpatient Diabetes Program Recommendations  AACE/ADA: New Consensus Statement on Inpatient Glycemic Control (2015)  Target Ranges:  Prepandial:   less than 140 mg/dL      Peak postprandial:   less than 180 mg/dL (1-2 hours)      Critically ill patients:  140 - 180 mg/dL   Lab Results  Component Value Date   GLUCAP 279 (H) 08/17/2022   HGBA1C 8.7 (H) 08/15/2022    Review of Glycemic Control  Latest Reference Range & Units 08/16/22 11:04 08/16/22 16:24 08/16/22 20:32 08/17/22 07:35 08/17/22 11:21  Glucose-Capillary 70 - 99 mg/dL 131 (H) 254 (H) 236 (H) 248 (H) 279 (H)  (H): Data is abnormally high Diabetes history: Type 2 DM Outpatient Diabetes medications: none Current orders for Inpatient glycemic control: ***  Inpatient Diabetes Program Recommendations:

## 2022-08-17 NOTE — Care Management Important Message (Signed)
Important Message  Patient Details  Name: Alexandra Price MRN: [id number] Date of Birth: 1946-08-17   Medicare Important Message Given:  Other (see comment) (spoke with by phone at [phone] to review letter, no additonal copy needed)     Tommy Medal 08/17/2022, 12:09 PM

## 2022-08-17 NOTE — Progress Notes (Signed)
Upon entering room to discharge patient. Patient complaining of chest pain, obtained 12 lead EKG, MD Dr. Joesph Fillers made aware, MD placed order for troponin. This nurse placed maalox order via general admission order set.

## 2022-08-17 NOTE — Discharge Instructions (Signed)
1)You are taking Eliquis/apixaban which is a blood thinner so please Avoid ibuprofen/Advil/Aleve/Motrin/Goody Powders/Naproxen/BC powders/Meloxicam/Diclofenac/Indomethacin and other Nonsteroidal anti-inflammatory medications as these will make you more likely to bleed and can cause stomach ulcers, can also cause Kidney problems.   2)Please follow-up with hematologist/oncologist Dr. [name], MD -Address: inside [facility] ([address]th Floor), [place], [place], [place] [address] Phone: [phone]  3) repeat CBC and BMP blood test within a week

## 2022-08-21 ENCOUNTER — Inpatient Hospital Stay: Payer: 59

## 2022-08-21 DIAGNOSIS — C9000 Multiple myeloma not having achieved remission: Secondary | ICD-10-CM

## 2022-08-23 ENCOUNTER — Other Ambulatory Visit: Payer: Self-pay

## 2022-08-23 DIAGNOSIS — F419 Anxiety disorder, unspecified: Secondary | ICD-10-CM

## 2022-08-23 DIAGNOSIS — C9000 Multiple myeloma not having achieved remission: Secondary | ICD-10-CM

## 2022-08-23 DIAGNOSIS — G479 Sleep disorder, unspecified: Secondary | ICD-10-CM

## 2022-08-23 MED ORDER — ZOLPIDEM TARTRATE 10 MG PO TABS: 10.0000 mg | ORAL_TABLET | Freq: Every evening | ORAL | 2 refills | Status: DC | PRN

## 2022-08-23 MED ORDER — ALPRAZOLAM 0.5 MG PO TABS: ORAL_TABLET | ORAL | 3 refills | Status: DC

## 2022-08-24 ENCOUNTER — Other Ambulatory Visit: Payer: Medicare Other

## 2022-08-24 ENCOUNTER — Encounter: Payer: Self-pay | Admitting: Hematology

## 2022-08-24 ENCOUNTER — Ambulatory Visit: Payer: Medicare Other

## 2022-08-27 ENCOUNTER — Inpatient Hospital Stay: Payer: 59

## 2022-08-27 ENCOUNTER — Inpatient Hospital Stay: Payer: 59 | Attending: Hematology

## 2022-08-27 ENCOUNTER — Inpatient Hospital Stay (HOSPITAL_BASED_OUTPATIENT_CLINIC_OR_DEPARTMENT_OTHER): Payer: 59 | Admitting: Hematology

## 2022-08-27 VITALS — BP 142/78 | Temp 98.2°F | Resp 18 | Wt 171.4 lb

## 2022-08-27 DIAGNOSIS — D631 Anemia in chronic kidney disease: Secondary | ICD-10-CM | POA: Diagnosis not present

## 2022-08-27 DIAGNOSIS — Z7901 Long term (current) use of anticoagulants: Secondary | ICD-10-CM | POA: Insufficient documentation

## 2022-08-27 DIAGNOSIS — Z5112 Encounter for antineoplastic immunotherapy: Secondary | ICD-10-CM | POA: Diagnosis present

## 2022-08-27 DIAGNOSIS — Z86711 Personal history of pulmonary embolism: Secondary | ICD-10-CM | POA: Insufficient documentation

## 2022-08-27 DIAGNOSIS — M79629 Pain in unspecified upper arm: Secondary | ICD-10-CM | POA: Diagnosis not present

## 2022-08-27 DIAGNOSIS — N189 Chronic kidney disease, unspecified: Secondary | ICD-10-CM | POA: Insufficient documentation

## 2022-08-27 DIAGNOSIS — C9000 Multiple myeloma not having achieved remission: Secondary | ICD-10-CM

## 2022-08-27 DIAGNOSIS — F419 Anxiety disorder, unspecified: Secondary | ICD-10-CM | POA: Diagnosis not present

## 2022-08-27 LAB — CBC WITH DIFFERENTIAL/PLATELET
Abs Immature Granulocytes: 0.03 10*3/uL (ref 0.00–0.07)
Basophils Absolute: 0 10*3/uL (ref 0.0–0.1)
Basophils Relative: 0 %
Eosinophils Absolute: 0.1 10*3/uL (ref 0.0–0.5)
Eosinophils Relative: 1 %
HCT: 32 % — ABNORMAL LOW (ref 36.0–46.0)
Hemoglobin: 10 g/dL — ABNORMAL LOW (ref 12.0–15.0)
Immature Granulocytes: 0 %
Lymphocytes Relative: 3 %
Lymphs Abs: 0.3 10*3/uL — ABNORMAL LOW (ref 0.7–4.0)
MCH: 32.6 pg (ref 26.0–34.0)
MCHC: 31.3 g/dL (ref 30.0–36.0)
MCV: 104.2 fL — ABNORMAL HIGH (ref 80.0–100.0)
Monocytes Absolute: 0.5 10*3/uL (ref 0.1–1.0)
Monocytes Relative: 5 %
Neutro Abs: 8.5 10*3/uL — ABNORMAL HIGH (ref 1.7–7.7)
Neutrophils Relative %: 91 %
Platelets: 117 10*3/uL — ABNORMAL LOW (ref 150–400)
RBC: 3.07 MIL/uL — ABNORMAL LOW (ref 3.87–5.11)
RDW: 14.3 % (ref 11.5–15.5)
WBC: 9.4 10*3/uL (ref 4.0–10.5)
nRBC: 0 % (ref 0.0–0.2)

## 2022-08-27 LAB — COMPREHENSIVE METABOLIC PANEL
ALT: 19 U/L (ref 0–44)
AST: 13 U/L — ABNORMAL LOW (ref 15–41)
Albumin: 3.5 g/dL (ref 3.5–5.0)
Alkaline Phosphatase: 72 U/L (ref 38–126)
Anion gap: 9 (ref 5–15)
BUN: 20 mg/dL (ref 8–23)
CO2: 28 mmol/L (ref 22–32)
Calcium: 8.7 mg/dL — ABNORMAL LOW (ref 8.9–10.3)
Chloride: 97 mmol/L — ABNORMAL LOW (ref 98–111)
Creatinine, Ser: 1.41 mg/dL — ABNORMAL HIGH (ref 0.44–1.00)
GFR, Estimated: 39 mL/min — ABNORMAL LOW (ref >60)
Glucose, Bld: 211 mg/dL — ABNORMAL HIGH (ref 70–99)
Potassium: 4.2 mmol/L (ref 3.5–5.1)
Sodium: 134 mmol/L — ABNORMAL LOW (ref 135–145)
Total Bilirubin: 1 mg/dL (ref 0.3–1.2)
Total Protein: 6.5 g/dL (ref 6.5–8.1)

## 2022-08-27 LAB — MAGNESIUM: Magnesium: 1.5 mg/dL — ABNORMAL LOW (ref 1.7–2.4)

## 2022-08-27 MED ORDER — DEXAMETHASONE 4 MG PO TABS
20.0000 mg | ORAL_TABLET | Freq: Once | ORAL | Status: DC
  Filled 2022-08-27: qty 5

## 2022-08-27 MED ORDER — PROCHLORPERAZINE MALEATE 10 MG PO TABS
10.0000 mg | ORAL_TABLET | Freq: Once | ORAL | Status: AC
  Administered 2022-08-27: 10 mg via ORAL
  Filled 2022-08-27: qty 1

## 2022-08-27 MED ORDER — DIPHENHYDRAMINE HCL 25 MG PO CAPS: 50.0000 mg | ORAL_CAPSULE | Freq: Once | ORAL | Status: DC

## 2022-08-27 MED ORDER — LORATADINE 10 MG PO TABS
10.0000 mg | ORAL_TABLET | Freq: Once | ORAL | Status: DC
  Filled 2022-08-27: qty 1

## 2022-08-27 MED ORDER — MAGNESIUM SULFATE 2 GM/50ML IV SOLN
2.0000 g | Freq: Once | INTRAVENOUS | Status: AC
  Administered 2022-08-27: 2 g via INTRAVENOUS
  Filled 2022-08-27: qty 50

## 2022-08-27 MED ORDER — ACETAMINOPHEN 325 MG PO TABS
650.0000 mg | ORAL_TABLET | Freq: Once | ORAL | Status: DC
  Filled 2022-08-27: qty 2

## 2022-08-27 MED ORDER — BORTEZOMIB CHEMO SQ INJECTION 3.5 MG (2.5MG/ML)
1.3000 mg/m2 | Freq: Once | INTRAMUSCULAR | Status: AC
  Administered 2022-08-27: 2.5 mg via SUBCUTANEOUS
  Filled 2022-08-27: qty 1

## 2022-08-27 MED ORDER — DARATUMUMAB-HYALURONIDASE-FIHJ 1800-30000 MG-UT/15ML ~~LOC~~ SOLN
1800.0000 mg | Freq: Once | SUBCUTANEOUS | Status: AC
  Administered 2022-08-27: 1800 mg via SUBCUTANEOUS
  Filled 2022-08-27: qty 15

## 2022-08-27 MED ORDER — FLUCONAZOLE 100 MG PO TABS: 100.0000 mg | ORAL_TABLET | Freq: Every day | ORAL | 0 refills | Status: DC

## 2022-08-27 NOTE — Patient Instructions (Addendum)
[place] at [facility] Discharge Instructions   You were seen and examined today by Dr. Delton Coombes.  He reviewed the results of your lab work which are mostly normal/stable. Your magnesium is low at 1.5 today. We will give you IV magnesium in the clinic.   We will resume with your treatment today.   We sent a prescription to your pharmacy for Diflucan. This is to treat the thrush in your mouth. Take as prescribed until complete.   Return as scheduled.    Thank you for choosing [place] at [facility] to provide your oncology and hematology care.  To afford each patient quality time with our provider, please arrive at least 15 minutes before your scheduled appointment time.   If you have a lab appointment with the [facility] please come in thru the Main Entrance and check in at the main information desk.  You need to re-schedule your appointment should you arrive 10 or more minutes late.  We strive to give you quality time with our providers, and arriving late affects you and other patients whose appointments are after yours.  Also, if you no show three or more times for appointments you may be dismissed from the clinic at the providers discretion.     Again, thank you for choosing [facility].  Our hope is that these requests will decrease the amount of time that you wait before being seen by our physicians.       _____________________________________________________________  Should you have questions after your visit to [facility], please contact our office at [phone] and follow the prompts.  Our office hours are 8:00 a.m. and 4:30 p.m. Monday - Friday.  Please note that voicemails left after 4:00 p.m. may not be returned until the following business day.  We are closed weekends and major holidays.  You do have access to a nurse 24-7, just call the main number to the clinic [phone] and do not press  any options, hold on the line and a nurse will answer the phone.    For prescription refill requests, have your pharmacy contact our office and allow 72 hours.    Due to Covid, you will need to wear a mask upon entering the hospital. If you do not have a mask, a mask will be given to you at the Main Entrance upon arrival. For doctor visits, patients may have 1 support person age 31 or older with them. For treatment visits, patients can not have anyone with them due to social distancing guidelines and our immunocompromised population.

## 2022-08-27 NOTE — Progress Notes (Signed)
Discontinue diphenhydramine from oncology treatment plan --> Add loratidine 10 mg po  x 1 as premedication for oncology treatment plan.  T.O. Dr Rhys Martini, PharmD

## 2022-08-27 NOTE — Progress Notes (Signed)
Alexandra Price, [place] [address]   CLINIC:  Medical Oncology/Hematology  PCP:  [name], MD [address] [place] / [place] [address] [phone]   REASON FOR VISIT:  Follow-up for multiple myeloma  PRIOR THERAPY:  1. KPD x 4 cycles from 04/17/2018 to 08/21/2018. 2. Stem cell transplant on 10/23/2018. 3. Radiation to left and right humerus 25 Gy in 10 fractions from 08/23/2020 to 08/25/2020. 4.  Maintenance Pomalyst until 06/08/2022  CURRENT THERAPY: Darzalex, Velcade and dexamethasone started on 06/11/2022  BRIEF ONCOLOGIC HISTORY:  Oncology History  Multiple myeloma not having achieved remission ([place])  04/07/2018 Initial Diagnosis   Multiple myeloma not having achieved remission ([place])   06/11/2022 -  Chemotherapy   Patient is on Treatment Plan : MYELOMA RELAPSED / REFRACTORY Daratumumab SQ + Bortezomib + Dexamethasone (DaraVd) q21d / Daratumumab SQ q28d      Multiple myeloma without remission ([place])  04/15/2018 Initial Diagnosis   Multiple myeloma without remission ([place])   04/17/2018 - 09/05/2018 Chemotherapy   The patient had dexamethasone (DECADRON) 4 MG tablet, 1 of 1 cycle, Start date: 04/15/2018, End date: 05/26/2018 palonosetron (ALOXI) injection 0.25 mg, 0.25 mg, Intravenous,  Once, 1 of 1 cycle Administration: 0.25 mg (04/17/2018), 0.25 mg (04/24/2018), 0.25 mg (05/01/2018) cyclophosphamide (CYTOXAN) 540 mg in sodium chloride 0.9 % 250 mL chemo infusion, 300 mg/m2 = 540 mg, Intravenous,  Once, 1 of 1 cycle Administration: 540 mg (04/17/2018), 540 mg (04/24/2018), 540 mg (05/01/2018) carfilzomib (KYPROLIS) 36 mg in dextrose 5 % 50 mL chemo infusion, 20 mg/m2 = 36 mg, Intravenous, Once, 5 of 5 cycles Administration: 36 mg (04/17/2018), 36 mg (04/18/2018), 60 mg (04/24/2018), 60 mg (04/25/2018), 60 mg (05/01/2018), 60 mg (05/02/2018), 60 mg (05/15/2018), 60 mg (05/16/2018), 60 mg (06/12/2018), 60 mg (06/13/2018), 60 mg (06/26/2018), 60 mg (06/27/2018), 60  mg (07/03/2018), 60 mg (07/04/2018), 60 mg (07/24/2018), 60 mg (07/25/2018), 60 mg (07/31/2018), 60 mg (08/01/2018), 60 mg (08/08/2018), 60 mg (08/07/2018), 60 mg (08/21/2018), 60 mg (08/22/2018), 60 mg (08/28/2018), 60 mg (08/29/2018), 60 mg (09/04/2018), 60 mg (09/05/2018)  for chemotherapy treatment.    06/11/2022 -  Chemotherapy   Patient is on Treatment Plan : MYELOMA RELAPSED / REFRACTORY Daratumumab SQ + Bortezomib + Dexamethasone (DaraVd) q21d / Daratumumab SQ q28d        CANCER STAGING:  Cancer Staging  No matching staging information was found for the patient.  INTERVAL HISTORY:  [name], a 77 y.o. female, seen for follow-up of multiple myeloma.  Last dose of Darzalex on 08/06/2022.  Admitted to the hospital from 08/14/2022 through 08/17/2022 with acute RSV bronchitis.  Today she has low energy levels but her respiratory symptoms are normalized.  Back pain is stable.  He reported some nausea only today after coming to our clinic.  She also reported tongue feels like numb.  REVIEW OF SYSTEMS:  Review of Systems  Respiratory:  Positive for shortness of breath.   Musculoskeletal:  Positive for back pain (Worse in the lower back).  Neurological:  Positive for dizziness and numbness (Right shin).  Psychiatric/Behavioral:  Positive for sleep disturbance.   All other systems reviewed and are negative.   PAST MEDICAL/SURGICAL HISTORY:  Past Medical History:  Diagnosis Date   Anxiety    Chronic kidney disease    Depression    Hypertension    Multiple myeloma ([place])    multiple myeloma   Pre-diabetes    Past Surgical History:  Procedure Laterality Date   ABDOMINAL HYSTERECTOMY     total   APPENDECTOMY     BALLOON DILATION N/A 05/28/2022   Procedure: BALLOON DILATION;  Surgeon: [name], DO;  [facility];  Service: Endoscopy;  Laterality: N/A;   BIOPSY  05/28/2022   Procedure: BIOPSY;  Surgeon: [name], DO;  [facility];  Service:  Endoscopy;;   COLONOSCOPY WITH PROPOFOL N/A 12/25/2021   Procedure: COLONOSCOPY WITH PROPOFOL;  Surgeon: [name], DO;  [facility];  Service: Endoscopy;  Laterality: N/A;  2:30pm   ESOPHAGOGASTRODUODENOSCOPY (EGD) WITH PROPOFOL N/A 05/28/2022   Procedure: ESOPHAGOGASTRODUODENOSCOPY (EGD) WITH PROPOFOL;  Surgeon: [name], DO;  [facility];  Service: Endoscopy;  Laterality: N/A;  10:00am, asa 3   LAPAROSCOPIC APPENDECTOMY N/A 05/20/2018   Procedure: APPENDECTOMY LAPAROSCOPIC;  Surgeon: [name], MD;  Location: [facility];  Service: General;  Laterality: N/A;   POLYPECTOMY  12/25/2021   Procedure: POLYPECTOMY;  Surgeon: [name], DO;  [facility];  Service: Endoscopy;;   PORTACATH PLACEMENT Right 04/14/2018   Procedure: INSERTION PORT-A-CATH;  Surgeon: [name], MD;  Location: [facility];  Service: General;  Laterality: Right;    SOCIAL HISTORY:  Social History   Socioeconomic History   Marital status: Legally Separated    Spouse name: Not on file   Number of children: 6   Years of education: Not on file   Highest education level: Not on file  Occupational History    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  Tobacco Use   Smoking status: Never   Smokeless tobacco: Never  Vaping Use   Vaping Use: Never used  Substance and Sexual Activity   Alcohol use: Never   Drug use: Not Currently   Sexual activity: Not Currently  Other Topics Concern   Not on file  Social History Narrative   Not on file   Social Determinants of Health   Financial Resource Strain: Low Risk  (07/06/2020)   Overall Financial Resource Strain (CARDIA)    Difficulty of Paying Living Expenses: Not hard at all  Food Insecurity: No Food Insecurity (08/14/2022)   Hunger Vital Sign    Worried About Running Out of Food in the Last Year: Never true    Ran Out of Food in the Last Year: Never true  Transportation Needs: No Transportation Needs (08/14/2022)   PRAPARE  - Hydrologist (Medical): No    Lack of Transportation (Non-Medical): No  Physical Activity: Inactive (07/06/2020)   Exercise Vital Sign    Days of Exercise per Week: 0 days    Minutes of Exercise per Session: 0 min  Stress: No Stress Concern Present (07/06/2020)   [place]    Feeling of Stress : Not at all  Social Connections: Moderately Isolated (07/06/2020)   Social Connection and Isolation Panel [NHANES]    Frequency of Communication with Friends and Family: More than three times a week    Frequency of Social Gatherings with Friends and Family: Twice a week    Attends Religious Services: 1 to 4 times per year    Active Member of [employer] or Organizations: No    Attends Archivist Meetings: Never    Marital Status: Separated  Intimate Partner Violence: Not At Risk (08/14/2022)   Humiliation, Afraid, Rape, and Kick questionnaire    Fear of Current or Ex-Partner: No  Emotionally Abused: No    Physically Abused: No    Sexually Abused: No    FAMILY HISTORY:  Family History  Problem Relation Age of Onset   Heart disease Mother    Emphysema Father    Diabetes Sister    Depression Sister    Cancer Brother        liver, lung, and colon. colon cancer at age 30.   Diabetes Brother     CURRENT MEDICATIONS:  Current Outpatient Medications  Medication Sig Dispense Refill   acetaminophen (TYLENOL) 325 MG tablet Take 650 mg by mouth as needed for moderate pain (prior to infusion).     acyclovir (ZOVIRAX) 400 MG tablet Take 1 tablet (400 mg total) by mouth 2 (two) times daily. 60 tablet 6   albuterol (PROVENTIL) (2.5 MG/3ML) 0.083% nebulizer solution Take 3 mLs (2.5 mg total) by nebulization every 4 (four) hours as needed for wheezing or shortness of breath. 75 mL 2   albuterol (VENTOLIN HFA) 108 (90 Base) MCG/ACT inhaler Inhale 2 puffs into the lungs every 4 (four) hours as needed  for wheezing or shortness of breath. 18 g 2   ALPRAZolam (XANAX) 0.5 MG tablet TAKE 1 TABLET BY MOUTH DURING THE DAY AS NEEDED FOR ANXIETY AND 2 TABLETS AT BEDTIME AS NEEDED FOR ANXIETY/INSOMNIA 90 tablet 3   Calcium Carb-Cholecalciferol (CALCIUM 1000 + D PO) Take 1,000 mg by mouth daily.     cetirizine (ZYRTEC) 10 MG tablet Take 10 mg by mouth daily.     dexamethasone (DECADRON) 4 MG tablet Take 4 mg by mouth See admin instructions. 5 tablets 30 min prior to infusion     dexAMETHasone 20 MG TABS Take 1 tablet by mouth as directed. Take 1 tablet 30 minutes prior to infusions (Patient not taking: Reported on 08/14/2022) 30 tablet 2   dextromethorphan-guaiFENesin (MUCINEX DM) 30-600 MG 12hr tablet Take 1 tablet by mouth 2 (two) times daily. 20 tablet 0   diphenhydrAMINE (BENADRYL) 25 MG tablet Take 50 mg by mouth as needed (prior to infusion).     ELIQUIS 2.5 MG TABS tablet Take 2.5 mg by mouth 2 (two) times daily.     escitalopram (LEXAPRO) 20 MG tablet Take 20 mg by mouth daily.     HYDROcodone-acetaminophen (NORCO) 10-325 MG tablet Take 1 tablet by mouth every 6 (six) hours as needed. 120 tablet 0   methocarbamol (ROBAXIN) 500 MG tablet Take 1 tablet (500 mg total) by mouth every 8 (eight) hours as needed for muscle spasms (back pain). 20 tablet 0   montelukast (SINGULAIR) 10 MG tablet TAKE 1 TABLET BY MOUTH AS DIRECTED 30 MINUTES PRIOR TO TREATMENT (Patient taking differently: Take 10 mg by mouth See admin instructions. Take 1 tablet by mouth as directed 30 minutes prior to treatment) 90 tablet 0   Multiple Vitamin (MULTI VITAMIN) TABS Take 1 tablet by mouth daily.     Nebulizers (COMPRESSOR/NEBULIZER) MISC 1 Units by Does not apply route 3 (three) times daily as needed. 1 each 0   Oxycodone HCl 10 MG TABS Take 1 tablet (10 mg total) by mouth every 6 (six) hours as needed. 120 tablet 0   pantoprazole (PROTONIX) 40 MG tablet Take 1 tablet (40 mg total) by mouth daily. 90 tablet 6   polyethylene  glycol (MIRALAX / GLYCOLAX) 17 g packet Take 17 g by mouth daily as needed for moderate constipation. 14 each 0   prochlorperazine (COMPAZINE) 10 MG tablet Take 1 tablet (10 mg total) by  mouth every 6 (six) hours as needed for nausea or vomiting. 30 tablet 0   rosuvastatin (CRESTOR) 10 MG tablet Take 10 mg by mouth daily.     zolpidem (AMBIEN) 10 MG tablet Take 1 tablet (10 mg total) by mouth at bedtime as needed for sleep. 30 tablet 2   No current facility-administered medications for this visit.    ALLERGIES:  Allergies  Allergen Reactions   Ciprofloxacin Anaphylaxis   Amoxicillin Other (See Comments)    unknown   Morphine And Related Nausea And Vomiting   Augmentin [Amoxicillin-Pot Clavulanate] Other (See Comments)    Headache, insomnia    PHYSICAL EXAM:  Performance status (ECOG): 1 - Symptomatic but completely ambulatory  There were no vitals filed for this visit.  Wt Readings from Last 3 Encounters:  08/27/22 171 lb 6.4 oz (77.7 kg)  08/14/22 178 lb 2.1 oz (80.8 kg)  08/04/22 181 lb (82.1 kg)   Physical Exam Vitals reviewed.  Constitutional:      Appearance: Normal appearance. She is obese.  Cardiovascular:     Rate and Rhythm: Normal rate and regular rhythm.     Pulses: Normal pulses.     Heart sounds: Normal heart sounds.  Pulmonary:     Effort: Pulmonary effort is normal.     Breath sounds: Normal breath sounds.  Neurological:     General: No focal deficit present.     Mental Status: She is alert and oriented to person, place, and time.  Psychiatric:        Mood and Affect: Mood normal.        Behavior: Behavior normal.      LABORATORY DATA:  I have reviewed the labs as listed.     Latest Ref Rng & Units 08/15/2022    4:02 AM 08/14/2022    2:54 PM 08/06/2022    9:58 AM  CBC  WBC 4.0 - 10.5 K/uL 4.6  5.0  5.3   Hemoglobin 12.0 - 15.0 g/dL 9.1  9.7  9.7   Hematocrit 36.0 - 46.0 % 28.9  31.1  29.4   Platelets 150 - 400 K/uL 101  103  39        Latest Ref Rng & Units 08/17/2022    4:51 AM 08/15/2022    4:02 AM 08/14/2022    2:54 PM  CMP  Glucose 70 - 99 mg/dL 220  424  149   BUN 8 - 23 mg/dL 33  27  25   Creatinine 0.44 - 1.00 mg/dL 1.37  1.60  1.63   Sodium 135 - 145 mmol/L 137  139  135   Potassium 3.5 - 5.1 mmol/L 5.1  4.9  4.0   Chloride 98 - 111 mmol/L 104  103  97   CO2 22 - 32 mmol/L '26  25  27   '$ Calcium 8.9 - 10.3 mg/dL 8.1  8.1  8.4   Total Protein 6.5 - 8.1 g/dL   7.0   Total Bilirubin 0.3 - 1.2 mg/dL   0.5   Alkaline Phos 38 - 126 U/L   79   AST 15 - 41 U/L   26   ALT 0 - 44 U/L   30     DIAGNOSTIC IMAGING:  I have independently reviewed the scans and discussed with the patient. DG Chest 2 View  Result Date: 08/14/2022 CLINICAL DATA:  Two-week history of productive cough, congestion, and chills EXAM: CHEST - 2 VIEW COMPARISON:  Radiograph of the ribs dated 03/27/2021  FINDINGS: Right chest wall port tip projects over the SVC. Normal lung volumes. No focal consolidations. No pleural effusion or pneumothorax. The heart size and mediastinal contours are within normal limits. Old healed fracture of the left clavicle and left posterior ribs. Unchanged T11 compression deformity. IMPRESSION: 1. No active cardiopulmonary disease. 2. Unchanged T11 compression deformity. Electronically Signed   By: Darrin Nipper M.D.   On: 08/14/2022 14:09   CT Thoracic Spine Wo Contrast  Result Date: 08/04/2022 CLINICAL DATA:  Acute on chronic back pain. History of multiple myeloma EXAM: CT THORACIC AND LUMBAR SPINE WITHOUT CONTRAST TECHNIQUE: Multidetector CT imaging of the thoracic and lumbar spine was performed without contrast. Multiplanar CT image reconstructions were also generated. RADIATION DOSE REDUCTION: This exam was performed according to the departmental dose-optimization program which includes automated exposure control, adjustment of the mA and/or kV according to patient size and/or use of iterative reconstruction technique.  COMPARISON:  , MRI PET-CT 05/24/2022 11/29/2020 FINDINGS: CT THORACIC SPINE FINDINGS Alignment: Exaggerated thoracic kyphosis.  No static listhesis. Vertebrae: Diffuse osseous demineralization. Heterogeneous appearance of the osseous structures with scattered areas of lucency likely secondary to known multiple myeloma. New superior endplate compression fracture of the T2 vertebral body with approximately 30% vertebral body height loss. Chronic mild superior endplate compression fracture of T11, not significantly changed. Focal lytic lesion within the left lamina of T11. Numerous lytic lesions throughout the sternum and manubrium. Paraspinal and other soft tissues: No acute abnormality. Disc levels: Mild multilevel thoracic spondylosis. No findings to suggest foraminal or canal stenosis by CT. CT LUMBAR SPINE FINDINGS Segmentation: 5 lumbar type vertebrae. Alignment: Normal. Vertebrae: Diffuse osseous demineralization. Heterogeneous appearance of the osseous structures with numerous scattered areas of lucency compatible with known myeloma. Chronic superior endplate compression fracture at L4 is unchanged. No acute fractures Paraspinal and other soft tissues: Hepatic steatosis. Aortic atherosclerosis. No acute findings. Disc levels: Degenerative disc disease most notably at L1-2 and L4-5. Mild lower lumbar facet arthropathy. Suspect mild canal stenosis at L4-5 with probable mild foraminal stenosis bilaterally at L4-5 and on the left at L3-4 and L5-S1. IMPRESSION: 1. New superior endplate compression fracture of the T2 vertebral body with approximately 30% vertebral body height loss. 2. Chronic superior endplate compression fractures of T11 and L4, unchanged. 3. Heterogeneous appearance of the osseous structures with numerous scattered areas of lucency compatible with known multiple myeloma. 4. Hepatic steatosis. 5. Aortic atherosclerosis (ICD10-I70.0). Electronically Signed   By: Davina Poke D.O.   On: 08/04/2022  17:17   CT L-SPINE NO CHARGE  Result Date: 08/04/2022 CLINICAL DATA:  Acute on chronic back pain. History of multiple myeloma EXAM: CT THORACIC AND LUMBAR SPINE WITHOUT CONTRAST TECHNIQUE: Multidetector CT imaging of the thoracic and lumbar spine was performed without contrast. Multiplanar CT image reconstructions were also generated. RADIATION DOSE REDUCTION: This exam was performed according to the departmental dose-optimization program which includes automated exposure control, adjustment of the mA and/or kV according to patient size and/or use of iterative reconstruction technique. COMPARISON:  , MRI PET-CT 05/24/2022 11/29/2020 FINDINGS: CT THORACIC SPINE FINDINGS Alignment: Exaggerated thoracic kyphosis.  No static listhesis. Vertebrae: Diffuse osseous demineralization. Heterogeneous appearance of the osseous structures with scattered areas of lucency likely secondary to known multiple myeloma. New superior endplate compression fracture of the T2 vertebral body with approximately 30% vertebral body height loss. Chronic mild superior endplate compression fracture of T11, not significantly changed. Focal lytic lesion within the left lamina of T11. Numerous lytic lesions throughout the sternum  and manubrium. Paraspinal and other soft tissues: No acute abnormality. Disc levels: Mild multilevel thoracic spondylosis. No findings to suggest foraminal or canal stenosis by CT. CT LUMBAR SPINE FINDINGS Segmentation: 5 lumbar type vertebrae. Alignment: Normal. Vertebrae: Diffuse osseous demineralization. Heterogeneous appearance of the osseous structures with numerous scattered areas of lucency compatible with known myeloma. Chronic superior endplate compression fracture at L4 is unchanged. No acute fractures Paraspinal and other soft tissues: Hepatic steatosis. Aortic atherosclerosis. No acute findings. Disc levels: Degenerative disc disease most notably at L1-2 and L4-5. Mild lower lumbar facet arthropathy.  Suspect mild canal stenosis at L4-5 with probable mild foraminal stenosis bilaterally at L4-5 and on the left at L3-4 and L5-S1. IMPRESSION: 1. New superior endplate compression fracture of the T2 vertebral body with approximately 30% vertebral body height loss. 2. Chronic superior endplate compression fractures of T11 and L4, unchanged. 3. Heterogeneous appearance of the osseous structures with numerous scattered areas of lucency compatible with known multiple myeloma. 4. Hepatic steatosis. 5. Aortic atherosclerosis (ICD10-I70.0). Electronically Signed   By: Davina Poke D.O.   On: 08/04/2022 17:17   CT Abdomen Pelvis W Contrast  Result Date: 08/04/2022 CLINICAL DATA:  Acute abdominal pain. Multiple myeloma. * Tracking Code: BO * EXAM: CT ABDOMEN AND PELVIS WITH CONTRAST TECHNIQUE: Multidetector CT imaging of the abdomen and pelvis was performed using the standard protocol following bolus administration of intravenous contrast. RADIATION DOSE REDUCTION: This exam was performed according to the departmental dose-optimization program which includes automated exposure control, adjustment of the mA and/or kV according to patient size and/or use of iterative reconstruction technique. CONTRAST:  16m OMNIPAQUE IOHEXOL 300 MG/ML  SOLN COMPARISON:  Noncontrast CT on 08/02/2022 FINDINGS: Lower Chest: No acute findings. Hepatobiliary: No hepatic masses identified. Moderate diffuse hepatic steatosis is demonstrated. Gallbladder is unremarkable. No evidence of biliary ductal dilatation. Pancreas:  No mass or inflammatory changes. Spleen: Within normal limits in size and appearance. Adrenals/Urinary Tract: No suspicious masses identified. No evidence of ureteral calculi or hydronephrosis. Stomach/Bowel: No evidence of obstruction, inflammatory process or abnormal fluid collections. Vascular/Lymphatic: No pathologically enlarged lymph nodes. No acute vascular findings. Aortic atherosclerotic calcification incidentally  noted. Reproductive: Prior hysterectomy noted. Adnexal regions are unremarkable in appearance. Other:  None. Musculoskeletal: Diffuse permeative osteopenia is again demonstrated, consistent with patient's known history of multiple myeloma. Multiple old left posterior rib fracture deformities are again seen. Old compression fracture deformities of the T11 and L4 vertebral bodies show no significant change. IMPRESSION: No acute findings within the abdomen or pelvis. Moderate hepatic steatosis. Diffuse permeative osteopenia, consistent with patient's known history of multiple myeloma. No acute fractures identified. Stable old compression deformities of T11 and L4 vertebral bodies, and old left posterior rib fractures. Aortic Atherosclerosis (ICD10-I70.0). Electronically Signed   By: JMarlaine HindM.D.   On: 08/04/2022 15:06   CT Renal Stone Study  Result Date: 08/02/2022 CLINICAL DATA:  Right-sided flank pain for several hours EXAM: CT ABDOMEN AND PELVIS WITHOUT CONTRAST TECHNIQUE: Multidetector CT imaging of the abdomen and pelvis was performed following the standard protocol without IV contrast. RADIATION DOSE REDUCTION: This exam was performed according to the departmental dose-optimization program which includes automated exposure control, adjustment of the mA and/or kV according to patient size and/or use of iterative reconstruction technique. COMPARISON:  05/24/2022 PET-CT FINDINGS: Lower chest: Calcified granuloma is noted in the right middle lobe. No focal infiltrate is seen. Hepatobiliary: Fatty infiltration of the liver is noted. Gallbladder is within normal limits. Pancreas: Unremarkable. No pancreatic  ductal dilatation or surrounding inflammatory changes. Spleen: Normal in size without focal abnormality. Adrenals/Urinary Tract: Adrenal glands are within normal limits. Kidneys are well visualized bilaterally. No renal calculi or obstructive changes are seen. The ureters are within normal limits. The  bladder is decompressed. Stomach/Bowel: Fecal material is noted throughout the colon consistent with a mild degree of constipation. The appendix has been surgically removed. Small bowel and stomach are within normal limits. Vascular/Lymphatic: Aortic atherosclerosis. No enlarged abdominal or pelvic lymph nodes. Reproductive: Status post hysterectomy. No adnexal masses. Other: No abdominal wall hernia or abnormality. No abdominopelvic ascites. Musculoskeletal: Multiple lytic lesions are noted scattered throughout the bony structures consistent with the given clinical history of myeloma. IMPRESSION: No renal calculi or obstructive changes are noted. Mild colonic constipation is noted. Changes of multiple myeloma. Electronically Signed   By: Inez Catalina M.D.   On: 08/02/2022 02:45     ASSESSMENT:  1.  IgG lambda plasma cell myeloma, stage II, standard risk: -4 cycles of KPD from 04/17/2018 through 08/21/2018, stem cell transplant on 10/23/2018. -PET scan on 01/21/2019 showed multiple bone lesions but without any hypermetabolic activity. -BMBX on 01/21/2019 with normocellular marrow with no increase in plasma cells.  Normal FISH.  MRD results negative. -Maintenance pomalidomide 2 mg 3 weeks on/1 week off started on 03/10/2019. -Bone marrow biopsy on 10/29/2019 shows trilineage hematopoiesis with no evidence of plasma cells.  Chromosome analysis and FISH are normal. -BM BX on 10/27/2020 at [place] with 30 to 40%, TLH.  2% of the total cells are CD 138+ plasma cells. - Pomalyst was held due to dizziness from 03/29/2021 through 04/26/2021. - MRI of the brain on 03/31/2021 did not show any evidence of intracranial lesions.  Multiple bone lesions in the skull compatible with sequela of myeloma.  - Labs at [facility] on 11/16/2021: M spike 0.24 g.  Immunofixation positive for IgG lambda. - PET scan on 12/14/2021: No evidence of FDG avid osseous or soft tissue myeloma. - She has developed left shoulder blade  and right posterior rib pain for the last 3 to 4 months. - 24-hour urine on 12/14/2021: Total protein 136 mg.  Immune immunofixation positive for lambda type Bence-Jones protein. -- PET scan (05/24/2022): Single new hypermetabolic lytic lesion involving left lamina of T11.  No canal encroachment.  No other hypermetabolic bone lesions.  Stable diffuse lytic lesions throughout the axial and appendicular skeleton.  No soft tissue lesions. - Maintenance Pomalyst discontinued on 05/29/2022 - XRT to the T11 completed on 06/20/2022 - Daratumumab, Velcade and dexamethasone started on 06/11/2022  2.  Pulmonary embolism: -CT angiogram on 04/21/2020 showed filling defect at [facility]. -VQ scan confirmed pulmonary embolism. -She is on Eliquis.   3.  Right shoulder and upper arm pain: - She received XRT to the chest from 12/26/2020 through 01/06/2021.  4.  Myeloma bone disease: - Denosumab held due to exposure of bone in the right lower jaw.  Last dose in 2020.   PLAN:  1.  IgG lambda plasma cell myeloma: - Reviewed her labs.  I have also reviewed myeloma panel from December which showed improvement in lambda light chains. - CBC today shows mild thrombocytopenia and anemia.  Creatinine is stable at 1.4. - Proceed with cycle 4-day 1 today.  She will receive Dara on day 1 and Velcade on days 1, 4, 8, 11 every 21 days.  RTC 3 weeks. - Will give her Diflucan for 5 days for thrush.   2.  Back pain/left shoulder blade pain/right lateral and posterior rib pains: - Continue oxycodone 10 mg every 6 hours as needed which is helping.   3.  Hypomagnesemia: - Magnesium today is 1.5.  She will receive IV magnesium.   4.  Anxiety: - Continue Xanax twice daily as needed.   5.  Sleeping difficulty: - Continue Ambien at bedtime as needed.   6.  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  Hemoglobin today is 10.   7.  Pulmonary embolism: - Continue Eliquis.  No bleeding  issues.   Orders placed this encounter:  No orders of the defined types were placed in this encounter.      [name], MD [place] [phone]

## 2022-08-27 NOTE — Patient Instructions (Signed)
[name]  Discharge Instructions: Thank you for choosing [facility] to provide your oncology and hematology care.  If you have a lab appointment with the [facility], please come in thru the Main Entrance and check in at the main information desk.  Wear comfortable clothing and clothing appropriate for easy access to any Portacath or PICC line.   We strive to give you quality time with your provider. You may need to reschedule your appointment if you arrive late (15 or more minutes).  Arriving late affects you and other patients whose appointments are after yours.  Also, if you miss three or more appointments without notifying the office, you may be dismissed from the clinic at the provider's discretion.      For prescription refill requests, have your pharmacy contact our office and allow 72 hours for refills to be completed.    Today you received the following chemotherapy and/or immunotherapy agents Daratumumab , velcade    To help prevent nausea and vomiting after your treatment, we encourage you to take your nausea medication as directed.  BELOW ARE SYMPTOMS THAT SHOULD BE REPORTED IMMEDIATELY: *FEVER GREATER THAN 100.4 F (38 C) OR HIGHER *CHILLS OR SWEATING *NAUSEA AND VOMITING THAT IS NOT CONTROLLED WITH YOUR NAUSEA MEDICATION *UNUSUAL SHORTNESS OF BREATH *UNUSUAL BRUISING OR BLEEDING *URINARY PROBLEMS (pain or burning when urinating, or frequent urination) *BOWEL PROBLEMS (unusual diarrhea, constipation, pain near the anus) TENDERNESS IN MOUTH AND THROAT WITH OR WITHOUT PRESENCE OF ULCERS (sore throat, sores in mouth, or a toothache) UNUSUAL RASH, SWELLING OR PAIN  UNUSUAL VAGINAL DISCHARGE OR ITCHING   Items with * indicate a potential emergency and should be followed up as soon as possible or go to the Emergency Department if any problems should occur.  Please show the CHEMOTHERAPY ALERT CARD or IMMUNOTHERAPY ALERT CARD at check-in to  the Emergency Department and triage nurse.  Should you have questions after your visit or need to cancel or reschedule your appointment, please contact [facility] [phone]  and follow the prompts.  Office hours are 8:00 a.m. to 4:30 p.m. Monday - Friday. Please note that voicemails left after 4:00 p.m. may not be returned until the following business day.  We are closed weekends and major holidays. You have access to a nurse at all times for urgent questions. Please call the main number to the clinic [phone] and follow the prompts.  For any non-urgent questions, you may also contact your provider using MyChart. We now offer e-Visits for anyone 21 and older to request care online for non-urgent symptoms. For details visit [email or web address].   Also download the MyChart app! Go to the app store, search "MyChart", open the app, select , and log in with your MyChart username and password.  Masks are optional in the cancer centers. If you would like for your care team to wear a mask while they are taking care of you, please let them know. You may have one support person who is at least 75 years old accompany you for your appointments.

## 2022-08-27 NOTE — Progress Notes (Signed)
Patient has been examined by Dr. Katragadda, and vital signs and labs have been reviewed. ANC, Creatinine, LFTs, hemoglobin, and platelets are within treatment parameters per M.D. - pt may proceed with treatment.  Primary RN and pharmacy notified.  

## 2022-08-28 ENCOUNTER — Ambulatory Visit: Payer: 59 | Admitting: Gastroenterology

## 2022-08-30 ENCOUNTER — Other Ambulatory Visit: Payer: Self-pay

## 2022-08-30 ENCOUNTER — Inpatient Hospital Stay: Payer: 59

## 2022-08-30 VITALS — BP 145/75 | HR 76 | Temp 96.9°F | Resp 16 | Wt 171.6 lb

## 2022-08-30 DIAGNOSIS — Z5112 Encounter for antineoplastic immunotherapy: Secondary | ICD-10-CM | POA: Diagnosis not present

## 2022-08-30 DIAGNOSIS — C9000 Multiple myeloma not having achieved remission: Secondary | ICD-10-CM

## 2022-08-30 MED ORDER — BORTEZOMIB CHEMO SQ INJECTION 3.5 MG (2.5MG/ML)
1.3000 mg/m2 | Freq: Once | INTRAMUSCULAR | Status: AC
  Administered 2022-08-30: 2.5 mg via SUBCUTANEOUS
  Filled 2022-08-30: qty 1

## 2022-08-30 NOTE — Patient Instructions (Signed)
[facility]  Discharge Instructions: Thank you for choosing [facility] to provide your oncology and hematology care.  If you have a lab appointment with the Cancer Center, please come in thru the Main Entrance and check in at the main information desk.  Wear comfortable clothing and clothing appropriate for easy access to any Portacath or PICC line.   We strive to give you quality time with your provider. You may need to reschedule your appointment if you arrive late (15 or more minutes).  Arriving late affects you and other patients whose appointments are after yours.  Also, if you miss three or more appointments without notifying the office, you may be dismissed from the clinic at the provider's discretion.      For prescription refill requests, have your pharmacy contact our office and allow 72 hours for refills to be completed.    Today you received the following chemotherapy and/or immunotherapy agents Velcade.  Bortezomib Injection What is this medication? BORTEZOMIB (bor TEZ oh mib) treats lymphoma. It may also be used to treat multiple myeloma, a type of bone marrow cancer. It works by blocking a protein that causes cancer cells to grow and multiply. This helps to slow or stop the spread of cancer cells. This medicine may be used for other purposes; ask your health care provider or pharmacist if you have questions. COMMON BRAND NAME(S): Velcade What should I tell my care team before I take this medication? They need to know if you have any of these conditions: Dehydration Diabetes Heart disease Liver disease Tingling of the fingers or toes or other nerve disorder An unusual or allergic reaction to bortezomib, other medications, foods, dyes, or preservatives If you or your partner are pregnant or trying to get pregnant Breastfeeding How should I use this medication? This medication is injected into a vein or under the skin. It is given by your  care team in a hospital or clinic setting. Talk to your care team about the use of this medication in children. Special care may be needed. Overdosage: If you think you have taken too much of this medicine contact a poison control center or emergency room at once. NOTE: This medicine is only for you. Do not share this medicine with others. What if I miss a dose? Keep appointments for follow-up doses. It is important not to miss your dose. Call your care team if you are unable to keep an appointment. What may interact with this medication? Ketoconazole Rifampin This list may not describe all possible interactions. Give your health care provider a list of all the medicines, herbs, non-prescription drugs, or dietary supplements you use. Also tell them if you smoke, drink alcohol, or use illegal drugs. Some items may interact with your medicine. What should I watch for while using this medication? Your condition will be monitored carefully while you are receiving this medication. You may need blood work while taking this medication. This medication may affect your coordination, reaction time, or judgment. Do not drive or operate machinery until you know how this medication affects you. Sit up or stand slowly to reduce the risk of dizzy or fainting spells. Drinking alcohol with this medication can increase the risk of these side effects. This medication may increase your risk of getting an infection. Call your care team for advice if you get a fever, chills, sore throat, or other symptoms of a cold or flu. Do not treat yourself. Try to avoid being around   people who are sick. Check with your care team if you have severe diarrhea, nausea, and vomiting, or if you sweat a lot. The loss of too much body fluid may make it dangerous for you to take this medication. Talk to your care team if you may be pregnant. Serious birth defects can occur if you take this medication during pregnancy and for 7 months after the  last dose. You will need a negative pregnancy test before starting this medication. Contraception is recommended while taking this medication and for 7 months after the last dose. Your care team can help you find the option that works for you. If your partner can get pregnant, use a condom during sex while taking this medication and for 4 months after the last dose. Do not breastfeed while taking this medication and for 2 months after the last dose. This medication may cause infertility. Talk to your care team if you are concerned about your fertility. What side effects may I notice from receiving this medication? Side effects that you should report to your care team as soon as possible: Allergic reactions--skin rash, itching, hives, swelling of the face, lips, tongue, or throat Bleeding--bloody or black, tar-like stools, vomiting blood or brown material that looks like coffee grounds, red or dark brown urine, small red or purple spots on skin, unusual bruising or bleeding Bleeding in the brain--severe headache, stiff neck, confusion, dizziness, change in vision, numbness or weakness of the face, arm, or leg, trouble speaking, trouble walking, vomiting Bowel blockage--stomach cramping, unable to have a bowel movement or pass gas, loss of appetite, vomiting Heart failure--shortness of breath, swelling of the ankles, feet, or hands, sudden weight gain, unusual weakness or fatigue Infection--fever, chills, cough, sore throat, wounds that don't heal, pain or trouble when passing urine, general feeling of discomfort or being unwell Liver injury--right upper belly pain, loss of appetite, nausea, light-colored stool, dark yellow or brown urine, yellowing skin or eyes, unusual weakness or fatigue Low blood pressure--dizziness, feeling faint or lightheaded, blurry vision Lung injury--shortness of breath or trouble breathing, cough, spitting up blood, chest pain, fever Pain, tingling, or numbness in the hands  or feet Severe or prolonged diarrhea Stomach pain, bloody diarrhea, pale skin, unusual weakness or fatigue, decrease in the amount of urine, which may be signs of hemolytic uremic syndrome Sudden and severe headache, confusion, change in vision, seizures, which may be signs of posterior reversible encephalopathy syndrome (PRES) TTP--purple spots on the skin or inside the mouth, pale skin, yellowing skin or eyes, unusual weakness or fatigue, fever, fast or irregular heartbeat, confusion, change in vision, trouble speaking, trouble walking Tumor lysis syndrome (TLS)--nausea, vomiting, diarrhea, decrease in the amount of urine, dark urine, unusual weakness or fatigue, confusion, muscle pain or cramps, fast or irregular heartbeat, joint pain Side effects that usually do not require medical attention (report to your care team if they continue or are bothersome): Constipation Diarrhea Fatigue Loss of appetite Nausea This list may not describe all possible side effects. Call your doctor for medical advice about side effects. You may report side effects to FDA at 1-800-FDA-1088. Where should I keep my medication? This medication is given in a hospital or clinic. It will not be stored at home. NOTE: This sheet is a summary. It may not cover all possible information. If you have questions about this medicine, talk to your doctor, pharmacist, or health care provider.  2023 Elsevier/Gold Standard (2022-01-03 00:00:00)        To help   nausea and vomiting after your treatment, we encourage you to take your nausea medication as directed.  BELOW ARE SYMPTOMS THAT SHOULD BE REPORTED IMMEDIATELY: *FEVER GREATER THAN 100.4 F (38 C) OR HIGHER *CHILLS OR SWEATING *NAUSEA AND VOMITING THAT IS NOT CONTROLLED WITH YOUR NAUSEA MEDICATION *UNUSUAL SHORTNESS OF BREATH *UNUSUAL BRUISING OR BLEEDING *URINARY PROBLEMS (pain or burning when urinating, or frequent urination) *BOWEL PROBLEMS (unusual diarrhea,  constipation, pain near the anus) TENDERNESS IN MOUTH AND THROAT WITH OR WITHOUT PRESENCE OF ULCERS (sore throat, sores in mouth, or a toothache) UNUSUAL RASH, SWELLING OR PAIN  UNUSUAL VAGINAL DISCHARGE OR ITCHING   Items with * indicate a potential emergency and should be followed up as soon as possible or go to the Emergency Department if any problems should occur.  Please show the CHEMOTHERAPY ALERT CARD or IMMUNOTHERAPY ALERT CARD at check-in to the Emergency Department and triage nurse.  Should you have questions after your visit or need to cancel or reschedule your appointment, please contact [facility] [phone]  and follow the prompts.  Office hours are 8:00 a.m. to 4:30 p.m. Monday - Friday. Please note that voicemails left after 4:00 p.m. may not be returned until the following business day.  We are closed weekends and major holidays. You have access to a nurse at all times for urgent questions. Please call the main number to the clinic [phone] and follow the prompts.  For any non-urgent questions, you may also contact your provider using MyChart. We now offer e-Visits for anyone 77 and older to request care online for non-urgent symptoms. For details visit [email or web address].   Also download the MyChart app! Go to the app store, search "MyChart", open the app, select [place], and log in with your MyChart username and password.

## 2022-08-30 NOTE — Progress Notes (Signed)
Patient presents today for D4 C4 Velcade injection. Vital signs within parameters for treatment. Patient has no complaints today of any changes since her last treatment. MAR reviewed and updated.   Velcade injection given today per MD orders. Tolerated without adverse affects. Vital signs stable. No complaints at this time. Discharged from clinic ambulatory in stable condition. Alert and oriented x 3. F/U with [facility] as scheduled.

## 2022-09-03 ENCOUNTER — Other Ambulatory Visit: Payer: Self-pay

## 2022-09-03 ENCOUNTER — Observation Stay (HOSPITAL_COMMUNITY)
Admission: EM | Admit: 2022-09-03 | Discharge: 2022-09-04 | Disposition: A | Discharge status: Home / Self Care | Payer: 59 | Attending: Internal Medicine | Admitting: Internal Medicine

## 2022-09-03 ENCOUNTER — Emergency Department (HOSPITAL_COMMUNITY): Payer: 59

## 2022-09-03 ENCOUNTER — Inpatient Hospital Stay: Payer: 59

## 2022-09-03 ENCOUNTER — Encounter (HOSPITAL_COMMUNITY): Payer: Self-pay

## 2022-09-03 ENCOUNTER — Other Ambulatory Visit: Payer: Self-pay | Admitting: *Deleted

## 2022-09-03 ENCOUNTER — Observation Stay (HOSPITAL_COMMUNITY): Payer: 59

## 2022-09-03 VITALS — BP 116/79 | HR 90 | Temp 97.9°F | Resp 18 | Wt 171.8 lb

## 2022-09-03 DIAGNOSIS — R531 Weakness: Secondary | ICD-10-CM | POA: Diagnosis present

## 2022-09-03 DIAGNOSIS — R262 Difficulty in walking, not elsewhere classified: Secondary | ICD-10-CM | POA: Diagnosis not present

## 2022-09-03 DIAGNOSIS — C9000 Multiple myeloma not having achieved remission: Secondary | ICD-10-CM | POA: Diagnosis not present

## 2022-09-03 DIAGNOSIS — N179 Acute kidney failure, unspecified: Secondary | ICD-10-CM

## 2022-09-03 DIAGNOSIS — Z86711 Personal history of pulmonary embolism: Secondary | ICD-10-CM | POA: Insufficient documentation

## 2022-09-03 DIAGNOSIS — R0902 Hypoxemia: Secondary | ICD-10-CM | POA: Diagnosis not present

## 2022-09-03 DIAGNOSIS — G9341 Metabolic encephalopathy: Secondary | ICD-10-CM | POA: Diagnosis present

## 2022-09-03 DIAGNOSIS — C9002 Multiple myeloma in relapse: Secondary | ICD-10-CM

## 2022-09-03 DIAGNOSIS — Z79899 Other long term (current) drug therapy: Secondary | ICD-10-CM | POA: Diagnosis not present

## 2022-09-03 DIAGNOSIS — R4182 Altered mental status, unspecified: Secondary | ICD-10-CM | POA: Diagnosis not present

## 2022-09-03 DIAGNOSIS — I129 Hypertensive chronic kidney disease with stage 1 through stage 4 chronic kidney disease, or unspecified chronic kidney disease: Secondary | ICD-10-CM | POA: Diagnosis not present

## 2022-09-03 DIAGNOSIS — R5381 Other malaise: Secondary | ICD-10-CM

## 2022-09-03 DIAGNOSIS — G479 Sleep disorder, unspecified: Secondary | ICD-10-CM

## 2022-09-03 DIAGNOSIS — Z1152 Encounter for screening for COVID-19: Secondary | ICD-10-CM | POA: Insufficient documentation

## 2022-09-03 DIAGNOSIS — D528 Other folate deficiency anemias: Secondary | ICD-10-CM

## 2022-09-03 DIAGNOSIS — Z95828 Presence of other vascular implants and grafts: Secondary | ICD-10-CM

## 2022-09-03 DIAGNOSIS — R42 Dizziness and giddiness: Secondary | ICD-10-CM | POA: Diagnosis not present

## 2022-09-03 DIAGNOSIS — D696 Thrombocytopenia, unspecified: Secondary | ICD-10-CM | POA: Diagnosis not present

## 2022-09-03 DIAGNOSIS — N1832 Chronic kidney disease, stage 3b: Secondary | ICD-10-CM | POA: Insufficient documentation

## 2022-09-03 DIAGNOSIS — J9601 Acute respiratory failure with hypoxia: Secondary | ICD-10-CM

## 2022-09-03 DIAGNOSIS — E1122 Type 2 diabetes mellitus with diabetic chronic kidney disease: Secondary | ICD-10-CM | POA: Insufficient documentation

## 2022-09-03 DIAGNOSIS — R2681 Unsteadiness on feet: Secondary | ICD-10-CM | POA: Diagnosis not present

## 2022-09-03 LAB — CBC WITH DIFFERENTIAL/PLATELET
Abs Immature Granulocytes: 0.01 10*3/uL (ref 0.00–0.07)
Abs Immature Granulocytes: 0.01 10*3/uL (ref 0.00–0.07)
Basophils Absolute: 0 10*3/uL (ref 0.0–0.1)
Basophils Absolute: 0 10*3/uL (ref 0.0–0.1)
Basophils Relative: 1 %
Basophils Relative: 0 %
Eosinophils Absolute: 0.1 10*3/uL (ref 0.0–0.5)
Eosinophils Absolute: 0 10*3/uL (ref 0.0–0.5)
Eosinophils Relative: 2 %
Eosinophils Relative: 0 %
HCT: 32.8 % — ABNORMAL LOW (ref 36.0–46.0)
HCT: 33.7 % — ABNORMAL LOW (ref 36.0–46.0)
Hemoglobin: 10.4 g/dL — ABNORMAL LOW (ref 12.0–15.0)
Hemoglobin: 10.7 g/dL — ABNORMAL LOW (ref 12.0–15.0)
Immature Granulocytes: 0 %
Immature Granulocytes: 0 %
Lymphocytes Relative: 12 %
Lymphocytes Relative: 3 %
Lymphs Abs: 0.4 10*3/uL — ABNORMAL LOW (ref 0.7–4.0)
Lymphs Abs: 0.2 10*3/uL — ABNORMAL LOW (ref 0.7–4.0)
MCH: 33 pg (ref 26.0–34.0)
MCH: 33.1 pg (ref 26.0–34.0)
MCHC: 31.7 g/dL (ref 30.0–36.0)
MCHC: 31.8 g/dL (ref 30.0–36.0)
MCV: 104.1 fL — ABNORMAL HIGH (ref 80.0–100.0)
MCV: 104.3 fL — ABNORMAL HIGH (ref 80.0–100.0)
Monocytes Absolute: 0.3 10*3/uL (ref 0.1–1.0)
Monocytes Absolute: 0.1 10*3/uL (ref 0.1–1.0)
Monocytes Relative: 9 %
Monocytes Relative: 2 %
Neutro Abs: 2.9 10*3/uL (ref 1.7–7.7)
Neutro Abs: 5.4 10*3/uL (ref 1.7–7.7)
Neutrophils Relative %: 76 %
Neutrophils Relative %: 95 %
Platelets: 64 10*3/uL — ABNORMAL LOW (ref 150–400)
Platelets: 65 10*3/uL — ABNORMAL LOW (ref 150–400)
RBC: 3.15 MIL/uL — ABNORMAL LOW (ref 3.87–5.11)
RBC: 3.23 MIL/uL — ABNORMAL LOW (ref 3.87–5.11)
RDW: 14.1 % (ref 11.5–15.5)
RDW: 14 % (ref 11.5–15.5)
WBC: 3.7 10*3/uL — ABNORMAL LOW (ref 4.0–10.5)
WBC: 5.7 10*3/uL (ref 4.0–10.5)
nRBC: 0 % (ref 0.0–0.2)
nRBC: 0 % (ref 0.0–0.2)

## 2022-09-03 LAB — COMPREHENSIVE METABOLIC PANEL
ALT: 22 U/L (ref 0–44)
ALT: 22 U/L (ref 0–44)
AST: 20 U/L (ref 15–41)
AST: 21 U/L (ref 15–41)
Albumin: 3.5 g/dL (ref 3.5–5.0)
Albumin: 3.7 g/dL (ref 3.5–5.0)
Alkaline Phosphatase: 67 U/L (ref 38–126)
Alkaline Phosphatase: 70 U/L (ref 38–126)
Anion gap: 13 (ref 5–15)
Anion gap: 11 (ref 5–15)
BUN: 22 mg/dL (ref 8–23)
BUN: 23 mg/dL (ref 8–23)
CO2: 24 mmol/L (ref 22–32)
CO2: 25 mmol/L (ref 22–32)
Calcium: 9.1 mg/dL (ref 8.9–10.3)
Calcium: 9.2 mg/dL (ref 8.9–10.3)
Chloride: 98 mmol/L (ref 98–111)
Chloride: 99 mmol/L (ref 98–111)
Creatinine, Ser: 1.68 mg/dL — ABNORMAL HIGH (ref 0.44–1.00)
Creatinine, Ser: 1.79 mg/dL — ABNORMAL HIGH (ref 0.44–1.00)
GFR, Estimated: 32 mL/min — ABNORMAL LOW (ref >60)
GFR, Estimated: 29 mL/min — ABNORMAL LOW (ref >60)
Glucose, Bld: 223 mg/dL — ABNORMAL HIGH (ref 70–99)
Glucose, Bld: 224 mg/dL — ABNORMAL HIGH (ref 70–99)
Potassium: 4.1 mmol/L (ref 3.5–5.1)
Potassium: 4.8 mmol/L (ref 3.5–5.1)
Sodium: 135 mmol/L (ref 135–145)
Sodium: 135 mmol/L (ref 135–145)
Total Bilirubin: 0.3 mg/dL (ref 0.3–1.2)
Total Bilirubin: 0.3 mg/dL (ref 0.3–1.2)
Total Protein: 6.5 g/dL (ref 6.5–8.1)
Total Protein: 6.8 g/dL (ref 6.5–8.1)

## 2022-09-03 LAB — CK: Total CK: 14 U/L — ABNORMAL LOW (ref 38–234)

## 2022-09-03 LAB — BLOOD GAS, VENOUS
Acid-Base Excess: 0.2 mmol/L (ref 0.0–2.0)
Bicarbonate: 27.4 mmol/L (ref 20.0–28.0)
O2 Saturation: 69.7 %
Patient temperature: 36.4
pCO2, Ven: 55 mmHg (ref 44–60)
pH, Ven: 7.3 (ref 7.25–7.43)
pO2, Ven: 42 mmHg (ref 32–45)

## 2022-09-03 LAB — URINALYSIS, ROUTINE W REFLEX MICROSCOPIC
Bilirubin Urine: NEGATIVE
Glucose, UA: NEGATIVE mg/dL
Hgb urine dipstick: NEGATIVE
Ketones, ur: NEGATIVE mg/dL
Nitrite: NEGATIVE
Protein, ur: NEGATIVE mg/dL
Specific Gravity, Urine: 1.015 (ref 1.005–1.030)
pH: 5 (ref 5.0–8.0)

## 2022-09-03 LAB — RESP PANEL BY RT-PCR (RSV, FLU A&B, COVID)  RVPGX2
Influenza A by PCR: NEGATIVE
Influenza B by PCR: NEGATIVE
Resp Syncytial Virus by PCR: NEGATIVE
SARS Coronavirus 2 by RT PCR: NEGATIVE

## 2022-09-03 LAB — LACTIC ACID, PLASMA
Lactic Acid, Venous: 1.6 mmol/L (ref 0.5–1.9)
Lactic Acid, Venous: 1 mmol/L (ref 0.5–1.9)

## 2022-09-03 LAB — GLUCOSE, CAPILLARY: Glucose-Capillary: 382 mg/dL — ABNORMAL HIGH (ref 70–99)

## 2022-09-03 LAB — CORTISOL: Cortisol, Plasma: 13.2 ug/dL

## 2022-09-03 LAB — FOLATE: Folate: 23.8 ng/mL (ref >5.9)

## 2022-09-03 LAB — TROPONIN I (HIGH SENSITIVITY)
Troponin I (High Sensitivity): 8 ng/L (ref <18)
Troponin I (High Sensitivity): 7 ng/L (ref <18)

## 2022-09-03 LAB — AMMONIA: Ammonia: 18 umol/L (ref 9–35)

## 2022-09-03 LAB — TSH: TSH: 0.514 u[IU]/mL (ref 0.350–4.500)

## 2022-09-03 LAB — VITAMIN B12: Vitamin B-12: 546 pg/mL (ref 180–914)

## 2022-09-03 LAB — MAGNESIUM: Magnesium: 1.7 mg/dL (ref 1.7–2.4)

## 2022-09-03 LAB — BRAIN NATRIURETIC PEPTIDE: B Natriuretic Peptide: 12 pg/mL (ref 0.0–100.0)

## 2022-09-03 MED ORDER — SODIUM CHLORIDE 0.9% FLUSH
10.0000 mL | INTRAVENOUS | Status: DC | PRN
  Administered 2022-09-03: 10 mL via INTRAVENOUS

## 2022-09-03 MED ORDER — INSULIN ASPART 100 UNIT/ML IJ SOLN
0.0000 [IU] | Freq: Every day | INTRAMUSCULAR | Status: DC
  Administered 2022-09-03: 5 [IU] via SUBCUTANEOUS

## 2022-09-03 MED ORDER — ESCITALOPRAM OXALATE 10 MG PO TABS
20.0000 mg | ORAL_TABLET | Freq: Every day | ORAL | Status: DC
  Administered 2022-09-04: 20 mg via ORAL
  Filled 2022-09-03: qty 2

## 2022-09-03 MED ORDER — ALBUTEROL SULFATE (2.5 MG/3ML) 0.083% IN NEBU: 2.5000 mg | INHALATION_SOLUTION | RESPIRATORY_TRACT | Status: DC | PRN

## 2022-09-03 MED ORDER — ONDANSETRON HCL 4 MG PO TABS: 4.0000 mg | ORAL_TABLET | Freq: Four times a day (QID) | ORAL | Status: DC | PRN

## 2022-09-03 MED ORDER — ONDANSETRON HCL 4 MG/2ML IJ SOLN: 4.0000 mg | Freq: Four times a day (QID) | INTRAMUSCULAR | Status: DC | PRN

## 2022-09-03 MED ORDER — OXYCODONE HCL 5 MG PO TABS
10.0000 mg | ORAL_TABLET | Freq: Four times a day (QID) | ORAL | Status: DC | PRN
  Administered 2022-09-03 – 2022-09-04 (×2): 10 mg via ORAL
  Filled 2022-09-03 (×3): qty 2

## 2022-09-03 MED ORDER — INSULIN ASPART 100 UNIT/ML IJ SOLN
0.0000 [IU] | Freq: Three times a day (TID) | INTRAMUSCULAR | Status: DC
  Administered 2022-09-04: 5 [IU] via SUBCUTANEOUS
  Administered 2022-09-04: 3 [IU] via SUBCUTANEOUS

## 2022-09-03 MED ORDER — LORATADINE 10 MG PO TABS
10.0000 mg | ORAL_TABLET | Freq: Every day | ORAL | Status: DC
  Administered 2022-09-04: 10 mg via ORAL
  Filled 2022-09-03: qty 1

## 2022-09-03 MED ORDER — ACYCLOVIR 800 MG PO TABS
400.0000 mg | ORAL_TABLET | Freq: Two times a day (BID) | ORAL | Status: DC
  Administered 2022-09-03 – 2022-09-04 (×2): 400 mg via ORAL
  Filled 2022-09-03 (×2): qty 1

## 2022-09-03 MED ORDER — PANTOPRAZOLE SODIUM 40 MG PO TBEC
40.0000 mg | DELAYED_RELEASE_TABLET | Freq: Every day | ORAL | Status: DC
  Administered 2022-09-04: 40 mg via ORAL
  Filled 2022-09-03: qty 1

## 2022-09-03 MED ORDER — SODIUM CHLORIDE 0.9 % IV BOLUS
500.0000 mL | Freq: Once | INTRAVENOUS | Status: AC
  Administered 2022-09-03: 500 mL via INTRAVENOUS

## 2022-09-03 MED ORDER — ROSUVASTATIN CALCIUM 20 MG PO TABS
10.0000 mg | ORAL_TABLET | Freq: Every day | ORAL | Status: DC
  Administered 2022-09-03: 10 mg via ORAL
  Filled 2022-09-03: qty 1

## 2022-09-03 MED ORDER — SODIUM CHLORIDE 0.9 % IV SOLN: INTRAVENOUS | Status: AC

## 2022-09-03 MED ORDER — APIXABAN 2.5 MG PO TABS
2.5000 mg | ORAL_TABLET | Freq: Two times a day (BID) | ORAL | Status: DC
  Administered 2022-09-03 – 2022-09-04 (×2): 2.5 mg via ORAL
  Filled 2022-09-03 (×2): qty 1

## 2022-09-03 MED ORDER — TECHNETIUM TO 99M ALBUMIN AGGREGATED
4.4000 | Freq: Once | INTRAVENOUS | Status: AC | PRN
  Administered 2022-09-03: 4.4 via INTRAVENOUS

## 2022-09-03 MED ORDER — ACETAMINOPHEN 650 MG RE SUPP: 650.0000 mg | Freq: Four times a day (QID) | RECTAL | Status: DC | PRN

## 2022-09-03 MED ORDER — SODIUM CHLORIDE 0.9 % IV SOLN: INTRAVENOUS | Status: DC

## 2022-09-03 MED ORDER — POLYETHYLENE GLYCOL 3350 17 G PO PACK: 17.0000 g | PACK | Freq: Every day | ORAL | Status: DC | PRN

## 2022-09-03 MED ORDER — ACETAMINOPHEN 325 MG PO TABS
650.0000 mg | ORAL_TABLET | Freq: Four times a day (QID) | ORAL | Status: DC | PRN
  Administered 2022-09-04: 650 mg via ORAL
  Filled 2022-09-03: qty 2

## 2022-09-03 MED ORDER — ZOLPIDEM TARTRATE 5 MG PO TABS
5.0000 mg | ORAL_TABLET | Freq: Every evening | ORAL | Status: DC | PRN
  Administered 2022-09-03: 5 mg via ORAL
  Filled 2022-09-03: qty 1

## 2022-09-03 NOTE — Progress Notes (Signed)
Patient presents today for Velcade injection.  Patient is complaining of dizziness, weakness, unsteadiness on her feet, arm heaviness, and loss of appetite.  Vital signs are stable.    MD made aware of presented symptoms.  Additional labs were added.  All labs are stable.  Patient was evaluated by Dr. Delton Coombes and it was suggested by him that we hold Velcade today and she go to the ED for further evaluation.  Patient was transported via wheelchair by NT to the ED.  Call report made to ED charge nurse.  Patient's port remains accessed for ED use.

## 2022-09-03 NOTE — ED Triage Notes (Signed)
Patient from cancer center with reports of dizziness/weakness since Dara injection last Thursday. Patient denies any other complaints at this time. Denies recent fevers. Currently undergoing treatment for Multiple Myeloma. Sats were 88-89% after transferring from w/c, denies [facility]. MD in room at time of triage for assessment.

## 2022-09-03 NOTE — ED Provider Notes (Signed)
[facility] EMERGENCY DEPARTMENT Provider Note   CSN: [id number] Arrival date & time: 09/03/22  9509     History  Chief Complaint  Patient presents with   Weakness    Alexandra Price is a 77 y.o. female.  She has a history of multiple myeloma and she went to the cancer center today to get an injection.  There she was noted to be weak and unsteady complaining of dizziness.  Was brought down to the emergency department for further evaluation.  Patient sleepy arousable to voice slow to answer.  She denies headache chest pain shortness of breath abdominal pain vomiting diarrhea or urinary symptoms.  She is a very poor historian.  Level 5 caveat secondary to altered mental status  The history is provided by the patient.  Weakness Severity:  Severe Progression:  Unchanged Chronicity:  New Relieved by:  Nothing Worsened by:  Activity Ineffective treatments:  Rest Associated symptoms: difficulty walking and dizziness   Associated symptoms: no abdominal pain, no cough, no fever, no nausea and no vomiting        Home Medications Prior to Admission medications   Medication Sig Start Date End Date Taking? Authorizing Provider  acetaminophen (TYLENOL) 325 MG tablet Take 650 mg by mouth as needed for moderate pain (prior to infusion).    [provider]  acyclovir (ZOVIRAX) 400 MG tablet Take 1 tablet (400 mg total) by mouth 2 (two) times daily. 06/22/22   Harriett Rush, [name]  albuterol (PROVENTIL) (2.5 MG/3ML) 0.083% nebulizer solution Take 3 mLs (2.5 mg total) by nebulization every 4 (four) hours as needed for wheezing or shortness of breath. 08/17/22 08/17/23  Roxan Hockey, MD  albuterol (VENTOLIN HFA) 108 (90 Base) MCG/ACT inhaler Inhale 2 puffs into the lungs every 4 (four) hours as needed for wheezing or shortness of breath. 08/17/22   Roxan Hockey, MD  ALPRAZolam (XANAX) 0.5 MG tablet TAKE 1 TABLET BY MOUTH DURING THE DAY AS NEEDED FOR ANXIETY AND 2 TABLETS AT  BEDTIME AS NEEDED FOR ANXIETY/INSOMNIA 08/23/22   [name], MD  Calcium Carb-Cholecalciferol (CALCIUM 1000 + D PO) Take 1,000 mg by mouth daily.    [provider]  cetirizine (ZYRTEC) 10 MG tablet Take 10 mg by mouth daily. 07/19/22   [provider]  dexamethasone (DECADRON) 4 MG tablet Take 4 mg by mouth See admin instructions. 5 tablets 30 min prior to infusion    [provider]  dexAMETHasone 20 MG TABS Take 1 tablet by mouth as directed. Take 1 tablet 30 minutes prior to infusions 05/30/22   [name], MD  dextromethorphan-guaiFENesin [facility] DM) 30-600 MG 12hr tablet Take 1 tablet by mouth 2 (two) times daily. 08/17/22   Roxan Hockey, MD  diphenhydrAMINE (BENADRYL) 25 MG tablet Take 50 mg by mouth as needed (prior to infusion).    [provider]  ELIQUIS 2.5 MG TABS tablet Take 2.5 mg by mouth 2 (two) times daily. 06/14/22   [provider]  escitalopram (LEXAPRO) 20 MG tablet Take 20 mg by mouth daily. 12/18/21   [provider]  fluconazole (DIFLUCAN) 100 MG tablet Take 1 tablet (100 mg total) by mouth daily. Take 2 tablets by mouth on day one and then one tablet daily until complete 08/27/22   [name], MD  HYDROcodone-acetaminophen [facility]) 10-325 MG tablet Take 1 tablet by mouth every 6 (six) hours as needed. 07/25/22   [name], MD  methocarbamol (ROBAXIN) 500 MG tablet Take 1 tablet (500  mg total) by mouth every 8 (eight) hours as needed for muscle spasms (back pain). 08/02/22   Mesner, Corene Cornea, MD  montelukast (SINGULAIR) 10 MG tablet TAKE 1 TABLET BY MOUTH AS DIRECTED 30 MINUTES PRIOR TO TREATMENT Patient taking differently: Take 10 mg by mouth See admin instructions. Take 1 tablet by mouth as directed 30 minutes prior to treatment 06/21/22   [name], MD  Multiple Vitamin (MULTI VITAMIN) TABS Take 1 tablet by mouth daily.    [provider]  Nebulizers  (COMPRESSOR/NEBULIZER) MISC 1 Units by Does not apply route 3 (three) times daily as needed. 08/17/22   Roxan Hockey, MD  Oxycodone HCl 10 MG TABS Take 1 tablet (10 mg total) by mouth every 6 (six) hours as needed. 08/06/22   [name], MD  pantoprazole (PROTONIX) 40 MG tablet Take 1 tablet (40 mg total) by mouth daily. 02/12/22   [name], MD  polyethylene glycol (MIRALAX / GLYCOLAX) 17 g packet Take 17 g by mouth daily as needed for moderate constipation. 08/04/22   Orpah Greek, MD  prochlorperazine (COMPAZINE) 10 MG tablet Take 1 tablet (10 mg total) by mouth every 6 (six) hours as needed for nausea or vomiting. 06/21/22   [name], MD  rosuvastatin (CRESTOR) 10 MG tablet Take 10 mg by mouth daily. 05/30/20   [provider]  zolpidem (AMBIEN) 10 MG tablet Take 1 tablet (10 mg total) by mouth at bedtime as needed for sleep. 08/23/22   [name], MD      Allergies    Ciprofloxacin, Amoxicillin, Morphine and related, and Augmentin [amoxicillin-pot clavulanate]    Review of Systems   Review of Systems  Unable to perform ROS: Mental status change  Constitutional:  Negative for fever.  Respiratory:  Negative for cough.   Gastrointestinal:  Negative for abdominal pain, nausea and vomiting.  Neurological:  Positive for dizziness and weakness.    Physical Exam Updated Vital Signs BP 133/78   Pulse 81   Temp (!) 97.5 F (36.4 C) (Oral)   Resp 10   SpO2 100%  Physical Exam Vitals and nursing note reviewed.  Constitutional:      General: She is not in acute distress.    Appearance: Normal appearance. She is well-developed.  HENT:     Head: Normocephalic and atraumatic.  Eyes:     Conjunctiva/sclera: Conjunctivae normal.  Cardiovascular:     Rate and Rhythm: Normal rate and regular rhythm.     Heart sounds: No murmur heard. Pulmonary:     Effort: Pulmonary effort is normal. No respiratory distress.     Breath sounds:  Normal breath sounds.  Abdominal:     Palpations: Abdomen is soft.     Tenderness: There is no abdominal tenderness. There is no guarding or rebound.  Musculoskeletal:        General: No swelling.     Cervical back: Neck supple.  Skin:    General: Skin is warm and dry.     Capillary Refill: Capillary refill takes less than 2 seconds.  Neurological:     General: No focal deficit present.     Comments: He is arousable to voice.  There is no slurred speech.  No gross facial asymmetry and she is moving all extremities to command and although generally weak     ED Results / Procedures / Treatments   Labs (all labs ordered are listed, but only abnormal results are displayed) Labs Reviewed  COMPREHENSIVE METABOLIC PANEL - Abnormal; Notable  for the following components:      Result Value   Glucose, Bld 224 (*)    Creatinine, Ser 1.79 (*)    GFR, Estimated 29 (*)    All other components within normal limits  CBC WITH DIFFERENTIAL/PLATELET - Abnormal; Notable for the following components:   RBC 3.23 (*)    Hemoglobin 10.7 (*)    HCT 33.7 (*)    MCV 104.3 (*)    Platelets 65 (*)    Lymphs Abs 0.2 (*)    All other components within normal limits  URINALYSIS, ROUTINE W REFLEX MICROSCOPIC - Abnormal; Notable for the following components:   Leukocytes,Ua TRACE (*)    Bacteria, UA RARE (*)    Non Squamous Epithelial 0-5 (*)    All other components within normal limits  CULTURE, BLOOD (ROUTINE X 2)  CULTURE, BLOOD (ROUTINE X 2)  RESP PANEL BY RT-PCR (RSV, FLU A&B, COVID)  RVPGX2  URINE CULTURE  BRAIN NATRIURETIC PEPTIDE  LACTIC ACID, PLASMA  LACTIC ACID, PLASMA  AMMONIA  URINALYSIS, COMPLETE (UACMP) WITH MICROSCOPIC  TROPONIN I (HIGH SENSITIVITY)  TROPONIN I (HIGH SENSITIVITY)    EKG EKG Interpretation  Date/Time:  Monday September 03 2022 10:11:05 EST Ventricular Rate:  77 PR Interval:  156 QRS Duration: 85 QT Interval:  391 QTC Calculation: 443 R Axis:   56 Text  Interpretation: Sinus rhythm Baseline wander in lead(s) V2 No significant change since prior 12/23 Confirmed by Aletta Edouard [phone]) on 09/03/2022 10:25:51 AM  Radiology NM Pulmonary Perfusion  Result Date: 09/03/2022 CLINICAL DATA:  Rule out pulmonary embolism. Shortness of breath and chest pain. EXAM: NUCLEAR MEDICINE PERFUSION LUNG SCAN TECHNIQUE: Perfusion images were obtained in multiple projections after intravenous injection of radiopharmaceutical. Ventilation scans intentionally deferred if perfusion scan and chest x-ray adequate for interpretation during COVID 19 epidemic. RADIOPHARMACEUTICALS:  4.4 mCi Tc-49mMAA IV COMPARISON:  Chest radiograph from earlier today FINDINGS: No peripheral segmental perfusion defects identified bilaterally. Heterogeneity on the oblique and lateral projection images is favored to represent sequelae of non-uniform soft tissue attenuation artifact. IMPRESSION: 1. No evidence for acute pulmonary embolus. Electronically Signed   By: TKerby MoorsM.D.   On: 09/03/2022 15:47   CT Chest Wo Contrast  Result Date: 09/03/2022 CLINICAL DATA:  Respiratory illness.  Nondiagnostic x-ray. EXAM: CT CHEST WITHOUT CONTRAST TECHNIQUE: Multidetector CT imaging of the chest was performed following the standard protocol without IV contrast. RADIATION DOSE REDUCTION: This exam was performed according to the departmental dose-optimization program which includes automated exposure control, adjustment of the mA and/or kV according to patient size and/or use of iterative reconstruction technique. COMPARISON:  October 13, 2020 FINDINGS: Cardiovascular: No significant vascular findings. Normal heart size. No pericardial effusion. Right central venous line identified. Mediastinum/Nodes: No enlarged mediastinal or axillary lymph nodes. Trachea, and esophagus demonstrate no significant findings. 4 mm low-density left thyroid nodule is unchanged. Lungs/Pleura: Minimal scar bilaterally noted.  Lungs are otherwise clear. No pleural effusion or pneumothorax. Upper Abdomen: Diffuse lucency of liver noted consistent with fatty infiltration of liver. Musculoskeletal: Diffuse lytic lesions throughout the marrow space consistent with clinical history of multiple myeloma unchanged. Degenerative joint changes of the spine. Chronic compression deformity of a lower thoracic vertebral body unchanged. IMPRESSION: 1. No acute abnormality of the chest.  No focal pneumonia. Electronically Signed   By: WAbelardo DieselM.D.   On: 09/03/2022 12:32   CT Head Wo Contrast  Result Date: 09/03/2022 CLINICAL DATA:  Mental status change, unknown cause.  Patient complaining of dizziness, weakness, unsteadiness on her feet, arm heaviness in loss of appetite. History of multiple myeloma. EXAM: CT HEAD WITHOUT CONTRAST TECHNIQUE: Contiguous axial images were obtained from the base of the skull through the vertex without intravenous contrast. RADIATION DOSE REDUCTION: This exam was performed according to the departmental dose-optimization program which includes automated exposure control, adjustment of the mA and/or kV according to patient size and/or use of iterative reconstruction technique. COMPARISON:  MRI brain 03/31/2021. FINDINGS: Brain: No midline shift, ventriculomegaly, mass effect, evidence of mass lesion, intracranial hemorrhage or evidence of cortically based acute infarction. Gray-white matter differentiation is within normal limits throughout the brain. Stable mild chronic small-vessel disease. Vascular: No hyperdense vessel. Skull: Numerous circumscribed, lytic lesions of the calvarium, compatible with known history of multiple myeloma. Sinuses/Orbits: Paranasal sinuses, mastoids and middle ear cavities are well aerated. Orbits are unremarkable. Other: None. IMPRESSION: No acute intracranial abnormalities. Electronically Signed   By: Emmit Alexanders M.D   On: 09/03/2022 12:25   DG Chest Port 1 View  Result Date:  09/03/2022 CLINICAL DATA:  Weakness EXAM: PORTABLE CHEST 1 VIEW COMPARISON:  Radiograph 08/14/2022 FINDINGS: Chest port catheter tip overlies the mid SVC. Unchanged cardiomediastinal silhouette. There is no focal airspace consolidation. There is no pleural effusion or evidence of pneumothorax. Chronic lower thoracic compression deformity. Chronic left clavicle injury with prominent callus formation. Chronic left upper rib injury. Thoracic spondylosis. IMPRESSION: No evidence of acute cardiopulmonary disease. Electronically Signed   By: Maurine Simmering M.D.   On: 09/03/2022 10:47    Procedures Procedures    Medications Ordered in ED Medications  0.9 %  sodium chloride infusion ( Intravenous Not Given 09/03/22 1652)  0.9 %  sodium chloride infusion ( Intravenous New Bag/Given 09/03/22 1652)  sodium chloride 0.9 % bolus 500 mL (0 mLs Intravenous Stopped 09/03/22 1357)  sodium chloride 0.9 % bolus 500 mL (0 mLs Intravenous Stopped 09/03/22 1653)  technetium albumin aggregated (MAA) injection solution 4.4 millicurie (4.4 millicuries Intravenous Contrast Given 09/03/22 1525)    ED Course/ Medical Decision Making/ A&P Clinical Course as of 09/03/22 1717  Mon Sep 03, 2022  1026 Patient's pulse ox was 85% with good waveform.  Asked nurse to place on oxygen. [MB]  1026 Review of patient's medications she is on narcotics and other sedating medications. [MB]  9604 Chest x-ray does not show any clear infiltrate. [MB]  5409 Reevaluated patient and she is now awake alert.  She is still oxygen dependent though nurse said when she went to the bathroom she dropped to 86%.  She denies any chest pain or cough.  Husband states she has been very weak over the last few days.  She does still feel dizzy although better and she said it started Friday after her last infusion. [MB]  8119 Discussed with Dr. Carles Collet [facility] hospitalist who will evaluate patient for admission.  He asked me to order the VQ scan.  He thinks they are  available today. [MB]    Clinical Course User Index [MB] Hayden Rasmussen, MD                             Medical Decision Making Amount and/or Complexity of Data Reviewed Labs: ordered. Radiology: ordered.  Risk Decision regarding hospitalization.   This patient complains of weakness dizziness hypoxia; this involves an extensive number of treatment Options and is a complaint that carries with it a high risk of complications  and morbidity. The differential includes somnolence from medication, dehydration, pneumonia, PE, volume overload  I ordered, reviewed and interpreted labs, which included CBC with normal white count, hemoglobin low stable from priors, chemistries with mild increase of creatinine, LFTs unremarkable, urinalysis without signs of infection, blood culture sent, lactate normal, troponin normal I ordered medication IV fluids and reviewed PMP when indicated. I ordered imaging studies which included chest x-ray and head CT, CT noncontrast chest and I independently    visualized and interpreted imaging which showed no acute findings Additional history obtained from patient's husband Previous records obtained and reviewed in epic including recent oncology notes I consulted Dr. Carles Collet [facility] hospitalist and discussed lab and imaging findings and discussed disposition.  Cardiac monitoring reviewed, normal sinus rhythm Social determinants considered, patient physically inactive Critical Interventions: None  After the interventions stated above, I reevaluated the patient and found patient still be requiring oxygen although mental status much improved Admission and further testing considered, she would benefit from admission for further workup of her hypoxia and altered mental status.  After discussion with hospitalist V/Q ordered.         Final Clinical Impression(s) / ED Diagnoses Final diagnoses:  Acute metabolic encephalopathy  Acute respiratory failure with hypoxia  ([place])  Multiple myeloma in relapse [facility])    Rx / DC Orders ED Discharge Orders     None         Hayden Rasmussen, MD 09/03/22 1721

## 2022-09-03 NOTE — ED Notes (Signed)
Pt out of room for v/q scan

## 2022-09-03 NOTE — Progress Notes (Signed)
Patients port flushed without difficulty.  Good blood return noted with no bruising or swelling noted at site.  Patient remains accessed for possible treatments.

## 2022-09-03 NOTE — Patient Instructions (Signed)
[facility]  Discharge Instructions: Thank you for choosing [facility] to provide your oncology and hematology care.  If you have a lab appointment with the Cancer Center, please come in thru the Main Entrance and check in at the main information desk.  Wear comfortable clothing and clothing appropriate for easy access to any Portacath or PICC line.   We strive to give you quality time with your provider. You may need to reschedule your appointment if you arrive late (15 or more minutes).  Arriving late affects you and other patients whose appointments are after yours.  Also, if you miss three or more appointments without notifying the office, you may be dismissed from the clinic at the provider's discretion.      For prescription refill requests, have your pharmacy contact our office and allow 72 hours for refills to be completed.    To help prevent nausea and vomiting after your treatment, we encourage you to take your nausea medication as directed.  BELOW ARE SYMPTOMS THAT SHOULD BE REPORTED IMMEDIATELY: *FEVER GREATER THAN 100.4 F (38 C) OR HIGHER *CHILLS OR SWEATING *NAUSEA AND VOMITING THAT IS NOT CONTROLLED WITH YOUR NAUSEA MEDICATION *UNUSUAL SHORTNESS OF BREATH *UNUSUAL BRUISING OR BLEEDING *URINARY PROBLEMS (pain or burning when urinating, or frequent urination) *BOWEL PROBLEMS (unusual diarrhea, constipation, pain near the anus) TENDERNESS IN MOUTH AND THROAT WITH OR WITHOUT PRESENCE OF ULCERS (sore throat, sores in mouth, or a toothache) UNUSUAL RASH, SWELLING OR PAIN  UNUSUAL VAGINAL DISCHARGE OR ITCHING   Items with * indicate a potential emergency and should be followed up as soon as possible or go to the Emergency Department if any problems should occur.  Please show the CHEMOTHERAPY ALERT CARD or IMMUNOTHERAPY ALERT CARD at check-in to the Emergency Department and triage nurse.  Should you have questions after your visit or need to  cancel or reschedule your appointment, please contact [facility] [phone]  and follow the prompts.  Office hours are 8:00 a.m. to 4:30 p.m. Monday - Friday. Please note that voicemails left after 4:00 p.m. may not be returned until the following business day.  We are closed weekends and major holidays. You have access to a nurse at all times for urgent questions. Please call the main number to the clinic [phone] and follow the prompts.  For any non-urgent questions, you may also contact your provider using MyChart. We now offer e-Visits for anyone 18 and older to request care online for non-urgent symptoms. For details visit [email or web address].   Also download the MyChart app! Go to the app store, search "MyChart", open the app, select [place], and log in with your MyChart username and password.   

## 2022-09-03 NOTE — H&P (Signed)
History and Physical    Patient: Alexandra Price TIR:[id number] DOB: 1945/10/04 DOA: 09/03/2022 DOS: the patient was seen and examined on 09/03/2022 PCP: [name], MD  Patient coming from: Home  Chief Complaint:  Chief Complaint  Patient presents with   Weakness   HPI: Alexandra Price is a 77 year old female with a history of myeloma, diabetes mellitus type 2, hypertension, CKD stage III, GERD, hyperlipidemia presenting with altered mental status and generalized weakness.  Patient presented to the emergency department from the cancer center at [facility] secondary to the above symptoms.  Apparently the patient was noted to be unsteady and dizzy.  The patient has had decreased oral intake and generalized weakness since 08/31/2022.  She denies any fevers, chills, chest pain, shortness of breath, vomiting, diarrhea, abdominal pain, dysuria, hematuria.  She has not had any hematochezia or melena.  She has had some nausea with decreased oral intake.  She complains of some dizziness and a bifrontal headache.  She denies any new medications.  Her last bowel movement was on 09/02/2022. In the ED, the patient was noted initially to be somnolent but arousable.  She was slow to respond.  She was afebrile hemodynamically stable with oxygen saturation 88% on room air. WBC 5.7, hemoglobin 10.7, platelets 65,000.  Sodium 135, potassium 4.8, bicarbonate 25, serum creatinine 1.79.  LFTs were unremarkable.  Magnesium 1.7.  CT chest showed diffuse lytic lesions throughout the marrow space consistent with myeloma.  Lungs were otherwise clear.  There is a left thyroid nodule.  CT brain was negative.  Chest x-ray was negative for acute findings.  The patient was given 500 cc normal saline.  Review of Systems: As mentioned in the history of present illness. All other systems reviewed and are negative. Past Medical History:  Diagnosis Date   Anxiety    Chronic kidney disease    Depression    Hypertension     Multiple myeloma ([place])    multiple myeloma   Pre-diabetes    Past Surgical History:  Procedure Laterality Date   ABDOMINAL HYSTERECTOMY     total   APPENDECTOMY     BALLOON DILATION N/A 05/28/2022   Procedure: BALLOON DILATION;  Surgeon: [name], DO;  Location: [facility];  Service: Endoscopy;  Laterality: N/A;   BIOPSY  05/28/2022   Procedure: BIOPSY;  Surgeon: [name], DO;  [facility];  Service: Endoscopy;;   COLONOSCOPY WITH PROPOFOL N/A 12/25/2021   Procedure: COLONOSCOPY WITH PROPOFOL;  Surgeon: [name], DO;  [facility];  Service: Endoscopy;  Laterality: N/A;  2:30pm   ESOPHAGOGASTRODUODENOSCOPY (EGD) WITH PROPOFOL N/A 05/28/2022   Procedure: ESOPHAGOGASTRODUODENOSCOPY (EGD) WITH PROPOFOL;  Surgeon: [name], DO;  [facility];  Service: Endoscopy;  Laterality: N/A;  10:00am, asa 3   LAPAROSCOPIC APPENDECTOMY N/A 05/20/2018   Procedure: APPENDECTOMY LAPAROSCOPIC;  Surgeon: [name], MD;  Location: [facility];  Service: General;  Laterality: N/A;   POLYPECTOMY  12/25/2021   Procedure: POLYPECTOMY;  Surgeon: [name], DO;  [facility];  Service: Endoscopy;;   PORTACATH PLACEMENT Right 04/14/2018   Procedure: INSERTION PORT-A-CATH;  Surgeon: [name], MD;  Location: [facility];  Service: General;  Laterality: Right;   Social History:  reports that she has never smoked. She has never used smokeless tobacco. She reports that she does not currently use drugs. She reports that she does not drink alcohol.  Allergies  Allergen Reactions  Ciprofloxacin Anaphylaxis   Amoxicillin Other (See Comments)    unknown   Morphine And Related Nausea And Vomiting   Augmentin [Amoxicillin-Pot Clavulanate] Other (See Comments)    Headache, insomnia    Family History  Problem Relation Age of Onset   Heart disease Mother    Emphysema Father    Diabetes Sister    Depression Sister    Cancer Brother         liver, lung, and colon. colon cancer at age 84.   Diabetes Brother     Prior to Admission medications   Medication Sig Start Date End Date Taking? Authorizing Provider  acetaminophen (TYLENOL) 325 MG tablet Take 650 mg by mouth as needed for moderate pain (prior to infusion).    [provider]  acyclovir (ZOVIRAX) 400 MG tablet Take 1 tablet (400 mg total) by mouth 2 (two) times daily. 06/22/22   Harriett Rush, [name]  albuterol (PROVENTIL) (2.5 MG/3ML) 0.083% nebulizer solution Take 3 mLs (2.5 mg total) by nebulization every 4 (four) hours as needed for wheezing or shortness of breath. 08/17/22 08/17/23  Roxan Hockey, MD  albuterol (VENTOLIN HFA) 108 (90 Base) MCG/ACT inhaler Inhale 2 puffs into the lungs every 4 (four) hours as needed for wheezing or shortness of breath. 08/17/22   Roxan Hockey, MD  ALPRAZolam (XANAX) 0.5 MG tablet TAKE 1 TABLET BY MOUTH DURING THE DAY AS NEEDED FOR ANXIETY AND 2 TABLETS AT BEDTIME AS NEEDED FOR ANXIETY/INSOMNIA 08/23/22   [name], MD  Calcium Carb-Cholecalciferol (CALCIUM 1000 + D PO) Take 1,000 mg by mouth daily.    [provider]  cetirizine (ZYRTEC) 10 MG tablet Take 10 mg by mouth daily. 07/19/22   [provider]  dexamethasone (DECADRON) 4 MG tablet Take 4 mg by mouth See admin instructions. 5 tablets 30 min prior to infusion    [provider]  dexAMETHasone 20 MG TABS Take 1 tablet by mouth as directed. Take 1 tablet 30 minutes prior to infusions 05/30/22   [name], MD  [facility]rom[facility]orphan-[facility] DM) 30-600 MG 12hr tablet Take 1 tablet by mouth 2 (two) times daily. 08/17/22   Roxan Hockey, MD  diphenhydrAMINE (BENADRYL) 25 MG tablet Take 50 mg by mouth as needed (prior to infusion).    [provider]  ELIQUIS 2.5 MG TABS tablet Take 2.5 mg by mouth 2 (two) times daily. 06/14/22   [provider]  escitalopram (LEXAPRO) 20 MG tablet Take 20 mg by  mouth daily. 12/18/21   [provider]  fluconazole (DIFLUCAN) 100 MG tablet Take 1 tablet (100 mg total) by mouth daily. Take 2 tablets by mouth on day one and then one tablet daily until complete 08/27/22   [name], MD  HYDROcodone-acetaminophen [facility]) 10-325 MG tablet Take 1 tablet by mouth every 6 (six) hours as needed. 07/25/22   [name], MD  methocarbamol (ROBAXIN) 500 MG tablet Take 1 tablet (500 mg total) by mouth every 8 (eight) hours as needed for muscle spasms (back pain). 08/02/22   Mesner, Corene Cornea, MD  montelukast (SINGULAIR) 10 MG tablet TAKE 1 TABLET BY MOUTH AS DIRECTED 30 MINUTES PRIOR TO TREATMENT Patient taking differently: Take 10 mg by mouth See admin instructions. Take 1 tablet by mouth as directed 30 minutes prior to treatment 06/21/22   [name], MD  Multiple Vitamin (MULTI VITAMIN) TABS Take 1 tablet by mouth daily.    [provider]  Nebulizers (COMPRESSOR/NEBULIZER) MISC 1 Units by Does not apply  route 3 (three) times daily as needed. 08/17/22   Roxan Hockey, MD  Oxycodone HCl 10 MG TABS Take 1 tablet (10 mg total) by mouth every 6 (six) hours as needed. 08/06/22   [name], MD  pantoprazole (PROTONIX) 40 MG tablet Take 1 tablet (40 mg total) by mouth daily. 02/12/22   [name], MD  polyethylene glycol (MIRALAX / GLYCOLAX) 17 g packet Take 17 g by mouth daily as needed for moderate constipation. 08/04/22   Orpah Greek, MD  prochlorperazine (COMPAZINE) 10 MG tablet Take 1 tablet (10 mg total) by mouth every 6 (six) hours as needed for nausea or vomiting. 06/21/22   [name], MD  rosuvastatin (CRESTOR) 10 MG tablet Take 10 mg by mouth daily. 05/30/20   [provider]  zolpidem (AMBIEN) 10 MG tablet Take 1 tablet (10 mg total) by mouth at bedtime as needed for sleep. 08/23/22   [name], MD    Physical Exam: Vitals:   09/03/22 1010 09/03/22 1030 09/03/22  1229  BP: 128/81 121/89 133/78  Pulse: 78 74 81  Resp: '11 11 10  '$ SpO2: (!) 88% 98% 100%   GENERAL:  Alert and conversant, NAD, well developed, cooperative, follows commands HEENT: [place]/AT, No thrush, No icterus, No oral ulcers Neck:  No neck mass, No meningismus, soft, supple CV: RRR, no S3, no S4, no rub, no JVD Lungs:  bibasilar crackles. No wheeze Abd: soft/NT +BS, nondistended Ext: No edema, no lymphangitis, no cyanosis, no rashes Neuro:  CN II-XII intact, strength 4-/5 in RUE, RLE, strength 4-/5 LUE, LLE; sensation intact bilateral; no dysmetria; babinski equivocal  Data Reviewed: Data reviewed above in history  Assessment and Plan: Acute metabolic encephalopathy -Multifactorial including acute on chronic renal failure, hypnotic medication use, and hypoxia -Continue IV fluids -Check VBG -Q30--092 -Folic acid 33.0 -TSH -[id number]  Hypoxia -Presented with oxygen saturation 88% on room air -Stable on 2 L -Wean oxygen tolerated -VQ scan  Acute on chronic renal failure--CKD stage IIIb -Baseline creatinine 1.3-1.4 -Presented with serum creatinine 1.79 -Secondary to volume depletion and poor solute intake -Continue IV fluids  Myeloma, not in remission -Last daratumumab dose on 08/27/2022 -Follow-up Dr. Delton Coombes  Diabetes mellitus type 2, uncontrolled with hyperglycemia -08/15/2022 hemoglobin A1c 8.7 -NovoLog sliding scale  Thrombocytopenia -Secondary to myeloma -Monitor for signs of bleeding  Opioid dependence -As expected, patient has chronic bone pain -PDMP reviewed--patient receives oxycodone 10 mg, Ambien, and Xanax 0.5 mg monthly  Mixed hyperlipidemia -Continue statin  History of pulmonary embolus -Diagnosed 04/21/2020 at [place]: FULL  Consults: none  Family Communication: none  Severity of Illness: The appropriate patient status for this patient is OBSERVATION. Observation status is judged to be reasonable and necessary in  order to provide the required intensity of service to ensure the patient's safety. The patient's presenting symptoms, physical exam findings, and initial radiographic and laboratory data in the context of their medical condition is felt to place them at decreased risk for further clinical deterioration. Furthermore, it is anticipated that the patient will be medically stable for discharge from the hospital within 2 midnights of admission.   Author: Orson Eva, MD 09/03/2022 1:41 PM  For on call review [email or web address].

## 2022-09-03 NOTE — Hospital Course (Signed)
77 year old female with a history of myeloma, diabetes mellitus type 2, hypertension, CKD stage III, GERD, hyperlipidemia presenting with altered mental status and generalized weakness.  Patient presented to the emergency department from the cancer center at [facility] secondary to the above symptoms.  Apparently the patient was noted to be unsteady and dizzy.  The patient has had decreased oral intake and generalized weakness since 08/31/2022.  She denies any fevers, chills, chest pain, shortness of breath, vomiting, diarrhea, abdominal pain, dysuria, hematuria.  She has not had any hematochezia or melena.  She has had some nausea with decreased oral intake.  She complains of some dizziness and a bifrontal headache.  She denies any new medications.  Her last bowel movement was on 09/02/2022. In the ED, the patient was noted initially to be somnolent but arousable.  She was slow to respond.  She was afebrile hemodynamically stable with oxygen saturation 88% on room air. WBC 5.7, hemoglobin 10.7, platelets 65,000.  Sodium 135, potassium 4.8, bicarbonate 25, serum creatinine 1.79.  LFTs were unremarkable.  Magnesium 1.7.  CT chest showed diffuse lytic lesions throughout the marrow space consistent with myeloma.  Lungs were otherwise clear.  There is a left thyroid nodule.  CT brain was negative.  Chest x-ray was negative for acute findings.  The patient was given 500 cc normal saline.

## 2022-09-04 ENCOUNTER — Ambulatory Visit: Payer: Medicare Other

## 2022-09-04 ENCOUNTER — Ambulatory Visit: Payer: Medicare Other | Admitting: Hematology

## 2022-09-04 ENCOUNTER — Other Ambulatory Visit: Payer: Medicare Other

## 2022-09-04 DIAGNOSIS — N179 Acute kidney failure, unspecified: Secondary | ICD-10-CM

## 2022-09-04 DIAGNOSIS — G9341 Metabolic encephalopathy: Secondary | ICD-10-CM | POA: Diagnosis not present

## 2022-09-04 DIAGNOSIS — C9000 Multiple myeloma not having achieved remission: Secondary | ICD-10-CM | POA: Diagnosis not present

## 2022-09-04 DIAGNOSIS — N1832 Chronic kidney disease, stage 3b: Secondary | ICD-10-CM | POA: Diagnosis not present

## 2022-09-04 LAB — CBC
HCT: 27.1 % — ABNORMAL LOW (ref 36.0–46.0)
Hemoglobin: 8.7 g/dL — ABNORMAL LOW (ref 12.0–15.0)
MCH: 33.6 pg (ref 26.0–34.0)
MCHC: 32.1 g/dL (ref 30.0–36.0)
MCV: 104.6 fL — ABNORMAL HIGH (ref 80.0–100.0)
Platelets: 58 10*3/uL — ABNORMAL LOW (ref 150–400)
RBC: 2.59 MIL/uL — ABNORMAL LOW (ref 3.87–5.11)
RDW: 13.9 % (ref 11.5–15.5)
WBC: 3.9 10*3/uL — ABNORMAL LOW (ref 4.0–10.5)
nRBC: 0 % (ref 0.0–0.2)

## 2022-09-04 LAB — BASIC METABOLIC PANEL
Anion gap: 10 (ref 5–15)
BUN: 28 mg/dL — ABNORMAL HIGH (ref 8–23)
CO2: 25 mmol/L (ref 22–32)
Calcium: 8.2 mg/dL — ABNORMAL LOW (ref 8.9–10.3)
Chloride: 101 mmol/L (ref 98–111)
Creatinine, Ser: 1.56 mg/dL — ABNORMAL HIGH (ref 0.44–1.00)
GFR, Estimated: 35 mL/min — ABNORMAL LOW (ref >60)
Glucose, Bld: 284 mg/dL — ABNORMAL HIGH (ref 70–99)
Potassium: 4.3 mmol/L (ref 3.5–5.1)
Sodium: 136 mmol/L (ref 135–145)

## 2022-09-04 LAB — BLOOD CULTURE ID PANEL (REFLEXED) - BCID2

## 2022-09-04 LAB — URINE CULTURE: Culture: <10000 — AB

## 2022-09-04 LAB — GLUCOSE, CAPILLARY
Glucose-Capillary: 280 mg/dL — ABNORMAL HIGH (ref 70–99)
Glucose-Capillary: 246 mg/dL — ABNORMAL HIGH (ref 70–99)

## 2022-09-04 LAB — T4, FREE: Free T4: 0.97 ng/dL (ref 0.61–1.12)

## 2022-09-04 LAB — MAGNESIUM: Magnesium: 1.5 mg/dL — ABNORMAL LOW (ref 1.7–2.4)

## 2022-09-04 MED ORDER — ZOLPIDEM TARTRATE 5 MG PO TABS: 10.0000 mg | ORAL_TABLET | Freq: Every evening | ORAL | Status: DC | PRN

## 2022-09-04 MED ORDER — INSULIN GLARGINE-YFGN 100 UNIT/ML ~~LOC~~ SOLN
8.0000 [IU] | Freq: Every day | SUBCUTANEOUS | Status: DC
  Filled 2022-09-04 (×2): qty 0.08

## 2022-09-04 MED ORDER — HEPARIN SOD (PORK) LOCK FLUSH 100 UNIT/ML IV SOLN
500.0000 [IU] | INTRAVENOUS | Status: AC | PRN
  Administered 2022-09-04: 500 [IU]
  Filled 2022-09-04: qty 5

## 2022-09-04 MED ORDER — CHLORHEXIDINE GLUCONATE CLOTH 2 % EX PADS
6.0000 | MEDICATED_PAD | Freq: Every day | CUTANEOUS | Status: DC
  Administered 2022-09-04: 6 via TOPICAL

## 2022-09-04 NOTE — Progress Notes (Signed)
  Transition of Care [facility]) Screening Note   Patient Details  Name: Alexandra Price Date of Birth: 25-Dec-1945   [name]) CM/SW Contact:    Ihor Gully, LCSW Phone Number: 09/04/2022, 1:40 PM    Transition of Care Department [facility]) has reviewed patient and no TOC needs have been identified at this time. We will continue to monitor patient advancement through interdisciplinary progression rounds. If new patient transition needs arise, please place a TOC consult.

## 2022-09-04 NOTE — Progress Notes (Signed)
Pt answers orientation questions correctly, A&O x 4, but is confused at times. Lung sounds diminished & +1 pitting edema noted in bilateral lower extremities. Vitals are stable. PRN oxycodone given for reported pain. Pt resting comfortably with eyes closed, respirations even and unlabored.

## 2022-09-04 NOTE — Discharge Summary (Addendum)
Physician Discharge Summary   Patient: Alexandra Price MRN: [id number] DOB: [birth year]  Admit date:     09/03/2022  Discharge date: 09/04/22  Discharge Physician: Shanon Brow Heer Justiss   PCP: [name], MD   Recommendations at discharge:   Please follow up with primary care provider within 1-2 weeks  Please repeat BMP and CBC in one week    Hospital Course: 77 year old female with a history of myeloma, diabetes mellitus type 2, hypertension, CKD stage III, GERD, hyperlipidemia presenting with altered mental status and generalized weakness.  Patient presented to the emergency department from the cancer center at [facility] secondary to the above symptoms.  Apparently the patient was noted to be unsteady and dizzy.  The patient has had decreased oral intake and generalized weakness since 08/31/2022.  She denies any fevers, chills, chest pain, shortness of breath, vomiting, diarrhea, abdominal pain, dysuria, hematuria.  She has not had any hematochezia or melena.  She has had some nausea with decreased oral intake.  She complains of some dizziness and a bifrontal headache.  She denies any new medications.  Her last bowel movement was on 09/02/2022. In the ED, the patient was noted initially to be somnolent but arousable.  She was slow to respond.  She was afebrile hemodynamically stable with oxygen saturation 88% on room air. WBC 5.7, hemoglobin 10.7, platelets 65,000.  Sodium 135, potassium 4.8, bicarbonate 25, serum creatinine 1.79.  LFTs were unremarkable.  Magnesium 1.7.  CT chest showed diffuse lytic lesions throughout the marrow space consistent with myeloma.  Lungs were otherwise clear.  There is a left thyroid nodule.  CT brain was negative.  Chest x-ray was negative for acute findings.  The patient was given 500 cc normal saline and subsequently started on IVF maintenance.  Her mental status improved back to baseline.  Pt prefers outpt PT referral which was sent at the time of d/c.  Assessment and  Plan: Acute metabolic encephalopathy -Multifactorial including acute on chronic renal failure with hypnotic medication use, and hypoxia -Continue IV fluids -Check RKY-7.0/62/37/62 -G31--517 -Folic acid 61.6 -TSH 0.737 -Ammonia--18 -UA neg for pyuria -09/04/22--mental status back to baseline   Hypoxia -Presented with oxygen saturation 88% on room air -Stable on 2 L -VBG 7.3/55/42/27 -Wean oxygen tolerated -overall improved and weaned to RA -VQ scan--neg for PE -due to hypoventilation from opioids   Acute on chronic renal failure--CKD stage IIIb -Baseline creatinine 1.3-1.4 -Presented with serum creatinine 1.79 -Secondary to volume depletion and poor solute intake -Continue IV fluids -serum creatinine improved to 1.56   Myeloma, not in remission -Last daratumumab dose on 08/27/2022 -Follow-up Dr. Delton Coombes   Diabetes mellitus type 2, uncontrolled with hyperglycemia -08/15/2022 hemoglobin A1c 8.7 -NovoLog sliding scale   Thrombocytopenia -Secondary to myeloma -Monitor for signs of bleeding   Opioid dependence -As expected, patient has chronic bone pain -PDMP reviewed--patient receives oxycodone 10 mg, Ambien, and Xanax 0.5 mg monthly   Mixed hyperlipidemia -Continue statin   History of pulmonary embolus -Diagnosed 04/21/2020 at [facility]     Consultants: none Procedures performed: none  Disposition: Home Diet recommendation:  Regular diet DISCHARGE MEDICATION: Allergies as of 09/04/2022       Reactions   Ciprofloxacin Anaphylaxis   Amoxicillin Other (See Comments)   unknown   Morphine And Related Nausea And Vomiting   Augmentin [amoxicillin-pot Clavulanate] Other (See Comments)   Headache, insomnia        Medication List     STOP taking these medications    Oxycodone  HCl 10 MG Tabs       TAKE these medications    acetaminophen 325 MG tablet Commonly known as: TYLENOL Take 650 mg by mouth as needed for moderate pain (prior to infusion).    acyclovir 400 MG tablet Commonly known as: ZOVIRAX Take 1 tablet (400 mg total) by mouth 2 (two) times daily.   albuterol 108 (90 Base) MCG/ACT inhaler Commonly known as: Ventolin HFA Inhale 2 puffs into the lungs every 4 (four) hours as needed for wheezing or shortness of breath.   albuterol (2.5 MG/3ML) 0.083% nebulizer solution Commonly known as: PROVENTIL Take 3 mLs (2.5 mg total) by nebulization every 4 (four) hours as needed for wheezing or shortness of breath.   ALPRAZolam 0.5 MG tablet Commonly known as: XANAX TAKE 1 TABLET BY MOUTH DURING THE DAY AS NEEDED FOR ANXIETY AND 2 TABLETS AT BEDTIME AS NEEDED FOR ANXIETY/INSOMNIA   CALCIUM 1000 + D PO Take 1,000 mg by mouth daily.   cetirizine 10 MG tablet Commonly known as: ZYRTEC Take 10 mg by mouth daily.   Compressor/Nebulizer Misc 1 Units by Does not apply route 3 (three) times daily as needed.   dexAMETHasone 20 MG Tabs Take 1 tablet by mouth as directed. Take 1 tablet 30 minutes prior to infusions   dextromethorphan-guaiFENesin 30-600 MG 12hr tablet Commonly known as: MUCINEX DM Take 1 tablet by mouth 2 (two) times daily.   diphenhydrAMINE 25 MG tablet Commonly known as: BENADRYL Take 50 mg by mouth as needed (prior to infusion).   Eliquis 2.5 MG Tabs tablet Generic drug: apixaban Take 2.5 mg by mouth 2 (two) times daily.   escitalopram 20 MG tablet Commonly known as: LEXAPRO Take 20 mg by mouth daily.   fluconazole 100 MG tablet Commonly known as: DIFLUCAN Take 1 tablet (100 mg total) by mouth daily. Take 2 tablets by mouth on day one and then one tablet daily until complete   HYDROcodone-acetaminophen 10-325 MG tablet Commonly known as: NORCO Take 1 tablet by mouth every 6 (six) hours as needed.   methocarbamol 500 MG tablet Commonly known as: ROBAXIN Take 1 tablet (500 mg total) by mouth every 8 (eight) hours as needed for muscle spasms (back pain).   montelukast 10 MG tablet Commonly known as:  SINGULAIR TAKE 1 TABLET BY MOUTH AS DIRECTED 30 MINUTES PRIOR TO TREATMENT What changed: See the new instructions.   Multi Vitamin Tabs Take 1 tablet by mouth daily.   pantoprazole 40 MG tablet Commonly known as: PROTONIX Take 1 tablet (40 mg total) by mouth daily.   polyethylene glycol 17 g packet Commonly known as: MIRALAX / GLYCOLAX Take 17 g by mouth daily as needed for moderate constipation.   prochlorperazine 10 MG tablet Commonly known as: COMPAZINE Take 1 tablet (10 mg total) by mouth every 6 (six) hours as needed for nausea or vomiting.   rosuvastatin 10 MG tablet Commonly known as: CRESTOR Take 10 mg by mouth daily.   zolpidem 5 MG tablet Commonly known as: AMBIEN Take 2 tablets (10 mg total) by mouth at bedtime as needed for sleep. What changed: medication strength        Discharge Exam: Filed Weights   09/03/22 1935  Weight: 78.3 kg   HEENT:  [place]/AT, No thrush, no icterus CV:  RRR, no rub, no S3, no S4 Lung:  CTA, no wheeze, no rhonchi Abd:  soft/+BS, NT Ext:  No edema, no lymphangitis, no synovitis, no rash Neuro:  CN II-XII intact, strength  4/5 in RUE, RLE, strength 4/5 LUE, LLE; sensation intact bilateral; no dysmetria; babinski equivocal   Condition at discharge: stable  The results of significant diagnostics from this hospitalization (including imaging, microbiology, ancillary and laboratory) are listed below for reference.   Imaging Studies: NM Pulmonary Perfusion  Result Date: 09/03/2022 CLINICAL DATA:  Rule out pulmonary embolism. Shortness of breath and chest pain. EXAM: NUCLEAR MEDICINE PERFUSION LUNG SCAN TECHNIQUE: Perfusion images were obtained in multiple projections after intravenous injection of radiopharmaceutical. Ventilation scans intentionally deferred if perfusion scan and chest x-ray adequate for interpretation during COVID 19 epidemic. RADIOPHARMACEUTICALS:  4.4 mCi Tc-83mMAA IV COMPARISON:  Chest radiograph from earlier today  FINDINGS: No peripheral segmental perfusion defects identified bilaterally. Heterogeneity on the oblique and lateral projection images is favored to represent sequelae of non-uniform soft tissue attenuation artifact. IMPRESSION: 1. No evidence for acute pulmonary embolus. Electronically Signed   By: TKerby MoorsM.D.   On: 09/03/2022 15:47   CT Chest Wo Contrast  Result Date: 09/03/2022 CLINICAL DATA:  Respiratory illness.  Nondiagnostic x-ray. EXAM: CT CHEST WITHOUT CONTRAST TECHNIQUE: Multidetector CT imaging of the chest was performed following the standard protocol without IV contrast. RADIATION DOSE REDUCTION: This exam was performed according to the departmental dose-optimization program which includes automated exposure control, adjustment of the mA and/or kV according to patient size and/or use of iterative reconstruction technique. COMPARISON:  October 13, 2020 FINDINGS: Cardiovascular: No significant vascular findings. Normal heart size. No pericardial effusion. Right central venous line identified. Mediastinum/Nodes: No enlarged mediastinal or axillary lymph nodes. Trachea, and esophagus demonstrate no significant findings. 4 mm low-density left thyroid nodule is unchanged. Lungs/Pleura: Minimal scar bilaterally noted. Lungs are otherwise clear. No pleural effusion or pneumothorax. Upper Abdomen: Diffuse lucency of liver noted consistent with fatty infiltration of liver. Musculoskeletal: Diffuse lytic lesions throughout the marrow space consistent with clinical history of multiple myeloma unchanged. Degenerative joint changes of the spine. Chronic compression deformity of a lower thoracic vertebral body unchanged. IMPRESSION: 1. No acute abnormality of the chest.  No focal pneumonia. Electronically Signed   By: WAbelardo DieselM.D.   On: 09/03/2022 12:32   CT Head Wo Contrast  Result Date: 09/03/2022 CLINICAL DATA:  Mental status change, unknown cause. Patient complaining of dizziness, weakness,  unsteadiness on her feet, arm heaviness in loss of appetite. History of multiple myeloma. EXAM: CT HEAD WITHOUT CONTRAST TECHNIQUE: Contiguous axial images were obtained from the base of the skull through the vertex without intravenous contrast. RADIATION DOSE REDUCTION: This exam was performed according to the departmental dose-optimization program which includes automated exposure control, adjustment of the mA and/or kV according to patient size and/or use of iterative reconstruction technique. COMPARISON:  MRI brain 03/31/2021. FINDINGS: Brain: No midline shift, ventriculomegaly, mass effect, evidence of mass lesion, intracranial hemorrhage or evidence of cortically based acute infarction. Gray-white matter differentiation is within normal limits throughout the brain. Stable mild chronic small-vessel disease. Vascular: No hyperdense vessel. Skull: Numerous circumscribed, lytic lesions of the calvarium, compatible with known history of multiple myeloma. Sinuses/Orbits: Paranasal sinuses, mastoids and middle ear cavities are well aerated. Orbits are unremarkable. Other: None. IMPRESSION: No acute intracranial abnormalities. Electronically Signed   By: WEmmit AlexandersM.D   On: 09/03/2022 12:25   DG Chest Port 1 View  Result Date: 09/03/2022 CLINICAL DATA:  Weakness EXAM: PORTABLE CHEST 1 VIEW COMPARISON:  Radiograph 08/14/2022 FINDINGS: Chest port catheter tip overlies the mid SVC. Unchanged cardiomediastinal silhouette. There is no focal airspace consolidation.  There is no pleural effusion or evidence of pneumothorax. Chronic lower thoracic compression deformity. Chronic left clavicle injury with prominent callus formation. Chronic left upper rib injury. Thoracic spondylosis. IMPRESSION: No evidence of acute cardiopulmonary disease. Electronically Signed   By: Maurine Simmering M.D.   On: 09/03/2022 10:47   DG Chest 2 View  Result Date: 08/14/2022 CLINICAL DATA:  Two-week history of productive cough,  congestion, and chills EXAM: CHEST - 2 VIEW COMPARISON:  Radiograph of the ribs dated 03/27/2021 FINDINGS: Right chest wall port tip projects over the SVC. Normal lung volumes. No focal consolidations. No pleural effusion or pneumothorax. The heart size and mediastinal contours are within normal limits. Old healed fracture of the left clavicle and left posterior ribs. Unchanged T11 compression deformity. IMPRESSION: 1. No active cardiopulmonary disease. 2. Unchanged T11 compression deformity. Electronically Signed   By: Darrin Nipper M.D.   On: 08/14/2022 14:09    Microbiology: Results for orders placed or performed during the hospital encounter of 09/03/22  Resp panel by RT-PCR (RSV, Flu A&B, Covid) Anterior Nasal Swab     Status: None   Collection Time: 09/03/22 11:01 AM   Specimen: Anterior Nasal Swab  Result Value Ref Range Status   SARS Coronavirus 2 by RT PCR NEGATIVE NEGATIVE Final    Comment: (NOTE) SARS-CoV-2 target nucleic acids are NOT DETECTED.  The SARS-CoV-2 RNA is generally detectable in upper respiratory specimens during the acute phase of infection. The lowest concentration of SARS-CoV-2 viral copies this assay can detect is 138 copies/mL. A negative result does not preclude SARS-Cov-2 infection and should not be used as the sole basis for treatment or other patient management decisions. A negative result may occur with  improper specimen collection/handling, submission of specimen other than nasopharyngeal swab, presence of viral mutation(s) within the areas targeted by this assay, and inadequate number of viral copies(<138 copies/mL). A negative result must be combined with clinical observations, patient history, and epidemiological information. The expected result is Negative.  Fact Sheet for Patients:  EntrepreneurPulse.com.au  Fact Sheet for Healthcare Providers:  IncredibleEmployment.be  This test is no t yet approved or cleared by  the [place] FDA and  has been authorized for detection and/or diagnosis of SARS-CoV-2 by FDA under an Emergency Use Authorization (EUA). This EUA will remain  in effect (meaning this test can be used) for the duration of the COVID-19 declaration under Section 564(b)(1) of the Act, 21 U.S.C.section 360bbb-3(b)(1), unless the authorization is terminated  or revoked sooner.       Influenza A by PCR NEGATIVE NEGATIVE Final   Influenza B by PCR NEGATIVE NEGATIVE Final    Comment: (NOTE) The Xpert Xpress SARS-CoV-2/FLU/RSV plus assay is intended as an aid in the diagnosis of influenza from Nasopharyngeal swab specimens and should not be used as a sole basis for treatment. Nasal washings and aspirates are unacceptable for Xpert Xpress SARS-CoV-2/FLU/RSV testing.  Fact Sheet for Patients: EntrepreneurPulse.com.au  Fact Sheet for Healthcare Providers: IncredibleEmployment.be  This test is not yet approved or cleared by the [place] FDA and has been authorized for detection and/or diagnosis of SARS-CoV-2 by FDA under an Emergency Use Authorization (EUA). This EUA will remain in effect (meaning this test can be used) for the duration of the COVID-19 declaration under Section 564(b)(1) of the Act, 21 U.S.C. section 360bbb-3(b)(1), unless the authorization is terminated or revoked.     Resp Syncytial Virus by PCR NEGATIVE NEGATIVE Final    Comment: (NOTE) Fact Sheet for Patients:  EntrepreneurPulse.com.au  Fact Sheet for Healthcare Providers: IncredibleEmployment.be  This test is not yet approved or cleared by the [place] FDA and has been authorized for detection and/or diagnosis of SARS-CoV-2 by FDA under an Emergency Use Authorization (EUA). This EUA will remain in effect (meaning this test can be used) for the duration of the COVID-19 declaration under Section 564(b)(1) of the Act, 21  U.S.C. section 360bbb-3(b)(1), unless the authorization is terminated or revoked.  Performed at [facility], [address] [place], [place] [address]   Culture, blood (routine x 2)     Status: None (Preliminary result)   Collection Time: 09/03/22 11:11 AM   Specimen: BLOOD RIGHT HAND  Result Value Ref Range Status   Specimen Description   Final    BLOOD RIGHT HAND Performed at [facility], [address], [place], [place] [address]    Special Requests   Final    BOTTLES DRAWN AEROBIC ONLY Blood Culture results may not be optimal due to an inadequate volume of blood received in culture bottles Performed at [facility], [address], [place], [place] [address]    Culture  Setup Time   Final    AEROBIC BOTTLE ONLY GRAM POSITIVE COCCI Gram Stain Report Called to,Read Back By and Verified With: VAL VALDESE @ 0940 ON 09/04/22 C VARNER CRITICAL RESULT CALLED TO, READ BACK BY AND VERIFIED WITH: PHARMD Jefm Petty [id number] BY JRS Performed at [facility], [place] [address], [place], [place] [address]    Culture PENDING  Incomplete   Report Status PENDING  Incomplete  Culture, blood (routine x 2)     Status: None (Preliminary result)   Collection Time: 09/03/22 11:11 AM   Specimen: BLOOD LEFT HAND  Result Value Ref Range Status   Specimen Description BLOOD LEFT HAND  Final   Special Requests   Final    BOTTLES DRAWN AEROBIC AND ANAEROBIC Blood Culture results may not be optimal due to an excessive volume of blood received in culture bottles   Culture   Final    NO GROWTH < 24 HOURS Performed at [facility], [address] [place], [place] [address]    Report Status PENDING  Incomplete  Blood Culture ID Panel (Reflexed)     Status: Abnormal   Collection Time: 09/03/22 11:11 AM  Result Value Ref Range Status   Enterococcus faecalis NOT DETECTED NOT DETECTED Final   Enterococcus Faecium NOT DETECTED NOT DETECTED Final   Listeria monocytogenes NOT DETECTED NOT DETECTED Final    Staphylococcus species DETECTED (A) NOT DETECTED Final    Comment: CRITICAL RESULT CALLED TO, READ BACK BY AND VERIFIED WITH: PHARMD LORI POOLE [id number] BY JRS    Staphylococcus aureus (BCID) NOT DETECTED NOT DETECTED Final   Staphylococcus epidermidis NOT DETECTED NOT DETECTED Final   Staphylococcus lugdunensis NOT DETECTED NOT DETECTED Final   Streptococcus species NOT DETECTED NOT DETECTED Final   Streptococcus agalactiae NOT DETECTED NOT DETECTED Final   Streptococcus pneumoniae NOT DETECTED NOT DETECTED Final   Streptococcus pyogenes NOT DETECTED NOT DETECTED Final   A.calcoaceticus-baumannii NOT DETECTED NOT DETECTED Final   Bacteroides fragilis NOT DETECTED NOT DETECTED Final   Enterobacterales NOT DETECTED NOT DETECTED Final   Enterobacter cloacae complex NOT DETECTED NOT DETECTED Final   Escherichia coli NOT DETECTED NOT DETECTED Final   Klebsiella aerogenes NOT DETECTED NOT DETECTED Final   Klebsiella oxytoca NOT DETECTED NOT DETECTED Final   Klebsiella pneumoniae NOT DETECTED NOT DETECTED Final  Proteus species NOT DETECTED NOT DETECTED Final   Salmonella species NOT DETECTED NOT DETECTED Final   Serratia marcescens NOT DETECTED NOT DETECTED Final   Haemophilus influenzae NOT DETECTED NOT DETECTED Final   Neisseria meningitidis NOT DETECTED NOT DETECTED Final   Pseudomonas aeruginosa NOT DETECTED NOT DETECTED Final   Stenotrophomonas maltophilia NOT DETECTED NOT DETECTED Final   Candida albicans NOT DETECTED NOT DETECTED Final   Candida auris NOT DETECTED NOT DETECTED Final   Candida glabrata NOT DETECTED NOT DETECTED Final   Candida krusei NOT DETECTED NOT DETECTED Final   Candida parapsilosis NOT DETECTED NOT DETECTED Final   Candida tropicalis NOT DETECTED NOT DETECTED Final   Cryptococcus neoformans/gattii NOT DETECTED NOT DETECTED Final    Comment: Performed at [facility], [address], [place], [place] [address]    Labs: CBC: Recent Labs  Lab  09/03/22 0093 09/03/22 1111 09/04/22 0540  WBC 3.7* 5.7 3.9*  NEUTROABS 2.9 5.4  --   HGB 10.4* 10.7* 8.7*  HCT 32.8* 33.7* 27.1*  MCV 104.1* 104.3* 104.6*  PLT 64* 65* 58*   Basic Metabolic Panel: Recent Labs  Lab 09/03/22 0837 09/03/22 1111 09/04/22 0540  NA 135 135 136  K 4.1 4.8 4.3  CL 98 99 101  CO2 '24 25 25  '$ GLUCOSE 223* 224* 284*  BUN 22 23 28*  CREATININE 1.68* 1.79* 1.56*  CALCIUM 9.1 9.2 8.2*  MG 1.7  --  1.5*   Liver Function Tests: Recent Labs  Lab 09/03/22 0837 09/03/22 1111  AST 20 21  ALT 22 22  ALKPHOS 67 70  BILITOT 0.3 0.3  PROT 6.5 6.8  ALBUMIN 3.5 3.7   CBG: Recent Labs  Lab 09/03/22 2042 09/04/22 0807 09/04/22 1131  GLUCAP 382* 280* 246*    Discharge time spent: greater than 30 minutes.  Signed: Orson Eva, MD [facility] 09/04/2022

## 2022-09-04 NOTE — Progress Notes (Signed)
PHARMACY - PHYSICIAN COMMUNICATION CRITICAL VALUE ALERT - BLOOD CULTURE IDENTIFICATION (BCID)  Alexandra Price is an 77 y.o. female who presented to [facility] on 09/03/2022 with a chief complaint of weakness  Assessment:   77 y.o. female.  She has a history of multiple myeloma and she went to the cancer center today to get an injection.  There she was noted to be weak and unsteady complaining of dizziness.  Was brought down to the emergency department for further evaluation. One of the BCX bottles + Staph species and no resistance, only 1 bottle. Likely contaminant. D/W MD. Patient is discharging home.   Name of physician (or Provider) Contacted: Dr. Carles Collet  Current antibiotics: none  Changes to prescribed antibiotics recommended:  Per MD, abx not warranted  Results for orders placed or performed during the hospital encounter of 09/03/22  Blood Culture ID Panel (Reflexed) (Collected: 09/03/2022 11:11 AM)  Result Value Ref Range   Enterococcus faecalis NOT DETECTED NOT DETECTED   Enterococcus Faecium NOT DETECTED NOT DETECTED   Listeria monocytogenes NOT DETECTED NOT DETECTED   Staphylococcus species DETECTED (A) NOT DETECTED   Staphylococcus aureus (BCID) NOT DETECTED NOT DETECTED   Staphylococcus epidermidis NOT DETECTED NOT DETECTED   Staphylococcus lugdunensis NOT DETECTED NOT DETECTED   Streptococcus species NOT DETECTED NOT DETECTED   Streptococcus agalactiae NOT DETECTED NOT DETECTED   Streptococcus pneumoniae NOT DETECTED NOT DETECTED   Streptococcus pyogenes NOT DETECTED NOT DETECTED   A.calcoaceticus-baumannii NOT DETECTED NOT DETECTED   Bacteroides fragilis NOT DETECTED NOT DETECTED   Enterobacterales NOT DETECTED NOT DETECTED   Enterobacter cloacae complex NOT DETECTED NOT DETECTED   Escherichia coli NOT DETECTED NOT DETECTED   Klebsiella aerogenes NOT DETECTED NOT DETECTED   Klebsiella oxytoca NOT DETECTED NOT DETECTED   Klebsiella pneumoniae NOT DETECTED NOT DETECTED    Proteus species NOT DETECTED NOT DETECTED   Salmonella species NOT DETECTED NOT DETECTED   Serratia marcescens NOT DETECTED NOT DETECTED   Haemophilus influenzae NOT DETECTED NOT DETECTED   Neisseria meningitidis NOT DETECTED NOT DETECTED   Pseudomonas aeruginosa NOT DETECTED NOT DETECTED   Stenotrophomonas maltophilia NOT DETECTED NOT DETECTED   Candida albicans NOT DETECTED NOT DETECTED   Candida auris NOT DETECTED NOT DETECTED   Candida glabrata NOT DETECTED NOT DETECTED   Candida krusei NOT DETECTED NOT DETECTED   Candida parapsilosis NOT DETECTED NOT DETECTED   Candida tropicalis NOT DETECTED NOT DETECTED   Cryptococcus neoformans/gattii NOT DETECTED NOT DETECTED    Cristy Friedlander 09/04/2022  2:46 PM

## 2022-09-04 NOTE — Care Management Obs Status (Signed)
[place] NOTIFICATION   Patient Details  Name: Alexandra Price MRN: [id number] Date of Birth: 05-28-1946   Medicare Observation Status Notification Given:  Yes (copy mailed to address on file as requested due to isolation)    Tommy Medal 09/04/2022, 3:39 PM

## 2022-09-04 NOTE — Inpatient Diabetes Management (Signed)
Inpatient Diabetes Program Recommendations  AACE/ADA: New Consensus Statement on Inpatient Glycemic Control   Target Ranges:  Prepandial:   less than 140 mg/dL      Peak postprandial:   less than 180 mg/dL (1-2 hours)      Critically ill patients:  140 - 180 mg/dL    Latest Reference Range & Units 09/03/22 20:42 09/04/22 08:07  Glucose-Capillary 70 - 99 mg/dL 382 (H) 280 (H)    Latest Reference Range & Units 10/13/20 12:16 08/15/22 08:30  Hemoglobin A1C 4.8 - 5.6 % 5.5 8.7 (H)   Review of Glycemic Control  Diabetes history: DM2 Outpatient Diabetes medications: None Current orders for Inpatient glycemic control: Novolog 0-9 units TID with meals, Novolog 0-5 units QHS  Inpatient Diabetes Program Recommendations:    Insulin: May want to consider ordering Semglee 8 units x1 now; then reassess glucose trends tomorrow to determine if low dose basal is needed daily.  HbgA1C: A1C 8.7% on 08/15/22 indicating an average glucose of 203 mg/dl over the past 2-3 months. Anticipate patient will need to be prescribed oral DM medication as outpatient especially if patient will continue to take Decadron before treatments.  NOTE: Patient admitted with acute metabolic encephalopathy, hypoxia, acute on chronic kidney failure, with noted myeloma hx. In reviewing chart, noted PCP note on 08/28/22 with Dr. Yong Channel which notes  DM2 hx with note that "pt off meds for about 1 year now, she was ill for over 76mh, a1c could reflect this, will hold on adding meds and reck a1c in 356ms." Noted Decadron 20 mg on home med list with directions to take 1 tablet 30 mins prior to infusions. Anticipate steroids are contributing to hyperglycemia.   Thanks, Alexandra AldermanRN, MSN, CDBartoniabetes Coordinator Inpatient Diabetes Program [phone] Pager from [phone]

## 2022-09-04 NOTE — Progress Notes (Signed)
Patient discharged with instructions given on medications and follow up visits,patient and family verbalized understanding. Port-a-cath deaccessed,needle intact.Covered site with dry sterile band aid..Accompanied by staff to an awaiting vehicle.

## 2022-09-06 ENCOUNTER — Inpatient Hospital Stay: Payer: 59

## 2022-09-06 VITALS — BP 137/71 | HR 79 | Temp 96.2°F | Resp 18 | Wt 173.8 lb

## 2022-09-06 DIAGNOSIS — Z5112 Encounter for antineoplastic immunotherapy: Secondary | ICD-10-CM | POA: Diagnosis not present

## 2022-09-06 DIAGNOSIS — C9000 Multiple myeloma not having achieved remission: Secondary | ICD-10-CM

## 2022-09-06 LAB — CULTURE, BLOOD (ROUTINE X 2)

## 2022-09-06 NOTE — Progress Notes (Signed)
Patient presents today for D8 C4 Velcade injection. Vital signs within parameters for treatment. Patient states she fell last night cleaning her shower. Reported patient's fall to Dr. Delton Coombes. Message received from Dr. Delton Coombes to hold treatment today and draw Myeloma labs with SPEP and free light chains. Per Dr. Delton Coombes make an appointment with him for next week prior to restarting treatment . Patient was sent to the ER on 09-04-2022 due to confusion and disoriented.   Vital signs stable. No complaints at this time. Discharged from clinic ambulatory in stable condition. Alert and oriented x 3. F/U with [facility] as scheduled.

## 2022-09-07 ENCOUNTER — Inpatient Hospital Stay: Payer: 59

## 2022-09-07 LAB — KAPPA/LAMBDA LIGHT CHAINS
Kappa free light chain: 5.3 mg/L (ref 3.3–19.4)
Kappa, lambda light chain ratio: 0.43 (ref 0.26–1.65)
Lambda free light chains: 12.2 mg/L (ref 5.7–26.3)

## 2022-09-08 LAB — CULTURE, BLOOD (ROUTINE X 2): Culture: NO GROWTH

## 2022-09-10 ENCOUNTER — Other Ambulatory Visit: Payer: 59

## 2022-09-10 ENCOUNTER — Ambulatory Visit: Payer: Medicare Other | Admitting: Hematology

## 2022-09-10 ENCOUNTER — Inpatient Hospital Stay (HOSPITAL_BASED_OUTPATIENT_CLINIC_OR_DEPARTMENT_OTHER): Payer: 59 | Admitting: Hematology

## 2022-09-10 ENCOUNTER — Inpatient Hospital Stay: Payer: 59

## 2022-09-10 ENCOUNTER — Inpatient Hospital Stay: Payer: 59 | Admitting: Dietician

## 2022-09-10 ENCOUNTER — Ambulatory Visit: Payer: 59

## 2022-09-10 ENCOUNTER — Other Ambulatory Visit: Payer: Self-pay

## 2022-09-10 ENCOUNTER — Other Ambulatory Visit: Payer: Medicare Other

## 2022-09-10 ENCOUNTER — Encounter: Payer: Self-pay | Admitting: Hematology

## 2022-09-10 DIAGNOSIS — Z5112 Encounter for antineoplastic immunotherapy: Secondary | ICD-10-CM | POA: Diagnosis not present

## 2022-09-10 DIAGNOSIS — C9000 Multiple myeloma not having achieved remission: Secondary | ICD-10-CM

## 2022-09-10 LAB — COMPREHENSIVE METABOLIC PANEL
ALT: 19 U/L (ref 0–44)
AST: 18 U/L (ref 15–41)
Albumin: 3.7 g/dL (ref 3.5–5.0)
Alkaline Phosphatase: 64 U/L (ref 38–126)
Anion gap: 12 (ref 5–15)
BUN: 27 mg/dL — ABNORMAL HIGH (ref 8–23)
CO2: 24 mmol/L (ref 22–32)
Calcium: 8.8 mg/dL — ABNORMAL LOW (ref 8.9–10.3)
Chloride: 96 mmol/L — ABNORMAL LOW (ref 98–111)
Creatinine, Ser: 1.81 mg/dL — ABNORMAL HIGH (ref 0.44–1.00)
GFR, Estimated: 29 mL/min — ABNORMAL LOW (ref >60)
Glucose, Bld: 283 mg/dL — ABNORMAL HIGH (ref 70–99)
Potassium: 4.1 mmol/L (ref 3.5–5.1)
Sodium: 132 mmol/L — ABNORMAL LOW (ref 135–145)
Total Bilirubin: 0.8 mg/dL (ref 0.3–1.2)
Total Protein: 6.6 g/dL (ref 6.5–8.1)

## 2022-09-10 LAB — CBC WITH DIFFERENTIAL/PLATELET
Abs Immature Granulocytes: 0.02 10*3/uL (ref 0.00–0.07)
Basophils Absolute: 0 10*3/uL (ref 0.0–0.1)
Basophils Relative: 1 %
Eosinophils Absolute: 0.1 10*3/uL (ref 0.0–0.5)
Eosinophils Relative: 2 %
HCT: 31.3 % — ABNORMAL LOW (ref 36.0–46.0)
Hemoglobin: 10.1 g/dL — ABNORMAL LOW (ref 12.0–15.0)
Immature Granulocytes: 0 %
Lymphocytes Relative: 6 %
Lymphs Abs: 0.4 10*3/uL — ABNORMAL LOW (ref 0.7–4.0)
MCH: 33.6 pg (ref 26.0–34.0)
MCHC: 32.3 g/dL (ref 30.0–36.0)
MCV: 104 fL — ABNORMAL HIGH (ref 80.0–100.0)
Monocytes Absolute: 0.5 10*3/uL (ref 0.1–1.0)
Monocytes Relative: 8 %
Neutro Abs: 5 10*3/uL (ref 1.7–7.7)
Neutrophils Relative %: 83 %
Platelets: 122 10*3/uL — ABNORMAL LOW (ref 150–400)
RBC: 3.01 MIL/uL — ABNORMAL LOW (ref 3.87–5.11)
RDW: 14.5 % (ref 11.5–15.5)
WBC: 6 10*3/uL (ref 4.0–10.5)
nRBC: 0 % (ref 0.0–0.2)

## 2022-09-10 LAB — PROTEIN ELECTROPHORESIS, SERUM
A/G Ratio: 1.3 (ref 0.7–1.7)
Albumin ELP: 3.2 g/dL (ref 2.9–4.4)
Alpha-1-Globulin: 0.3 g/dL (ref 0.0–0.4)
Alpha-2-Globulin: 0.9 g/dL (ref 0.4–1.0)
Beta Globulin: 0.8 g/dL (ref 0.7–1.3)
Gamma Globulin: 0.5 g/dL (ref 0.4–1.8)
Globulin, Total: 2.4 g/dL (ref 2.2–3.9)
M-Spike, %: 0.2 g/dL — ABNORMAL HIGH
Total Protein ELP: 5.6 g/dL — ABNORMAL LOW (ref 6.0–8.5)

## 2022-09-10 LAB — MAGNESIUM: Magnesium: 1.5 mg/dL — ABNORMAL LOW (ref 1.7–2.4)

## 2022-09-10 MED ORDER — HEPARIN SOD (PORK) LOCK FLUSH 100 UNIT/ML IV SOLN
500.0000 [IU] | Freq: Once | INTRAVENOUS | Status: AC
  Administered 2022-09-10: 500 [IU] via INTRAVENOUS

## 2022-09-10 MED ORDER — BORTEZOMIB CHEMO SQ INJECTION 3.5 MG (2.5MG/ML)
1.3000 mg/m2 | Freq: Once | INTRAMUSCULAR | Status: AC
  Administered 2022-09-10: 2.5 mg via SUBCUTANEOUS
  Filled 2022-09-10: qty 1

## 2022-09-10 MED ORDER — SODIUM CHLORIDE 0.9% FLUSH
10.0000 mL | Freq: Once | INTRAVENOUS | Status: AC
  Administered 2022-09-10: 10 mL via INTRAVENOUS

## 2022-09-10 MED ORDER — MAGNESIUM SULFATE 2 GM/50ML IV SOLN
2.0000 g | Freq: Once | INTRAVENOUS | Status: AC
  Administered 2022-09-10: 2 g via INTRAVENOUS
  Filled 2022-09-10: qty 50

## 2022-09-10 MED ORDER — SODIUM CHLORIDE 0.9 % IV SOLN: INTRAVENOUS | Status: DC

## 2022-09-10 MED ORDER — DEXAMETHASONE 4 MG PO TABS
20.0000 mg | ORAL_TABLET | Freq: Once | ORAL | Status: AC
  Administered 2022-09-10: 20 mg via ORAL
  Filled 2022-09-10: qty 5

## 2022-09-10 MED ORDER — MAGNESIUM OXIDE -MG SUPPLEMENT 400 (240 MG) MG PO TABS: 400.0000 mg | ORAL_TABLET | Freq: Two times a day (BID) | ORAL | 4 refills | Status: DC

## 2022-09-10 MED ORDER — SODIUM CHLORIDE 0.9% FLUSH
10.0000 mL | INTRAVENOUS | Status: DC | PRN
  Administered 2022-09-10: 10 mL via INTRAVENOUS

## 2022-09-10 MED ORDER — HYDROCODONE-ACETAMINOPHEN 10-325 MG PO TABS: 1.0000 | ORAL_TABLET | Freq: Four times a day (QID) | ORAL | 0 refills | Status: DC | PRN

## 2022-09-10 NOTE — Progress Notes (Signed)
Patients port flushed without difficulty.  Good blood return noted with no bruising or swelling noted at site.  Stable during access and blood draw.  Patient remains accessed for possible treatment.

## 2022-09-10 NOTE — Progress Notes (Signed)
Patient has been examined by Dr. Delton Coombes, and vital signs and labs have been reviewed. ANC, Creatinine, LFTs, hemoglobin, and platelets are within treatment parameters per M.D. - pt may proceed with treatment.  Give NS 500 ml over 1 hour and magnesium 2g IV per MD. Primary RN and pharmacy notified.

## 2022-09-10 NOTE — Progress Notes (Signed)
Labs reviewed with MD at office visit today. Will proceed with velcade only , no dara. Will also give magnesium and saline bolus per MD orders.   Patient tolerated it well without problems. Vitals stable and discharged home from clinic ambulatory. Follow up as scheduled.

## 2022-09-10 NOTE — Progress Notes (Signed)
Alexandra Price, Alexandra Price [id number]   CLINIC:  Medical Oncology/Hematology  PCP:  [name], MD [address] [place] / [place] [address] [phone]   REASON FOR VISIT:  Follow-up for multiple myeloma  PRIOR THERAPY:  1. KPD x 4 cycles from 04/17/2018 to 08/21/2018. 2. Stem cell transplant on 10/23/2018. 3. Radiation to left and right humerus 25 Gy in 10 fractions from 08/23/2020 to 08/25/2020. 4.  Maintenance Pomalyst until 06/08/2022  CURRENT THERAPY: Darzalex, Velcade and dexamethasone started on 06/11/2022  BRIEF ONCOLOGIC HISTORY:  Oncology History  Multiple myeloma not having achieved remission ([place])  04/07/2018 Initial Diagnosis   Multiple myeloma not having achieved remission ([place])   06/11/2022 -  Chemotherapy   Patient is on Treatment Plan : MYELOMA RELAPSED / REFRACTORY Daratumumab SQ + Bortezomib + Dexamethasone (DaraVd) q21d / Daratumumab SQ q28d      Multiple myeloma without remission ([place])  04/15/2018 Initial Diagnosis   Multiple myeloma without remission ([place])   04/17/2018 - 09/05/2018 Chemotherapy   The patient had dexamethasone (DECADRON) 4 MG tablet, 1 of 1 cycle, Start date: 04/15/2018, End date: 05/26/2018 palonosetron (ALOXI) injection 0.25 mg, 0.25 mg, Intravenous,  Once, 1 of 1 cycle Administration: 0.25 mg (04/17/2018), 0.25 mg (04/24/2018), 0.25 mg (05/01/2018) cyclophosphamide (CYTOXAN) 540 mg in sodium chloride 0.9 % 250 mL chemo infusion, 300 mg/m2 = 540 mg, Intravenous,  Once, 1 of 1 cycle Administration: 540 mg (04/17/2018), 540 mg (04/24/2018), 540 mg (05/01/2018) carfilzomib (KYPROLIS) 36 mg in dextrose 5 % 50 mL chemo infusion, 20 mg/m2 = 36 mg, Intravenous, Once, 5 of 5 cycles Administration: 36 mg (04/17/2018), 36 mg (04/18/2018), 60 mg (04/24/2018), 60 mg (04/25/2018), 60 mg (05/01/2018), 60 mg (05/02/2018), 60 mg (05/15/2018), 60 mg (05/16/2018), 60 mg (06/12/2018), 60 mg (06/13/2018), 60 mg (06/26/2018), 60 mg (06/27/2018), 60  mg (07/03/2018), 60 mg (07/04/2018), 60 mg (07/24/2018), 60 mg (07/25/2018), 60 mg (07/31/2018), 60 mg (08/01/2018), 60 mg (08/08/2018), 60 mg (08/07/2018), 60 mg (08/21/2018), 60 mg (08/22/2018), 60 mg (08/28/2018), 60 mg (08/29/2018), 60 mg (09/04/2018), 60 mg (09/05/2018)  for chemotherapy treatment.    06/11/2022 -  Chemotherapy   Patient is on Treatment Plan : MYELOMA RELAPSED / REFRACTORY Daratumumab SQ + Bortezomib + Dexamethasone (DaraVd) q21d / Daratumumab SQ q28d        CANCER STAGING:  Cancer Staging  No matching staging information was found for the patient.  INTERVAL HISTORY:  Alexandra Price, a 77 y.o. female, seen for follow-up of multiple myeloma.  She was recently admitted to the hospital with confusion which was thought to be metabolic and from medications including Ambien, pain medication.  She is feeling better at this time.  She reports that her legs tingle on and off.  No tingling in the fingertips and toes.  REVIEW OF SYSTEMS:  Review of Systems  Constitutional:  Positive for fatigue.  Musculoskeletal:  Positive for back pain (Worse in the lower back).  Neurological:  Positive for dizziness and numbness (Right shin).  Psychiatric/Behavioral:  Positive for sleep disturbance.   All other systems reviewed and are negative.   PAST MEDICAL/SURGICAL HISTORY:  Past Medical History:  Diagnosis Date   Anxiety    Chronic kidney disease    Depression    Hypertension    Multiple myeloma ([place])    multiple myeloma   Pre-diabetes    Past Surgical History:  Procedure Laterality Date   ABDOMINAL HYSTERECTOMY     total  APPENDECTOMY     BALLOON DILATION N/A 05/28/2022   Procedure: BALLOON DILATION;  Surgeon: [name], DO;  [facility];  Service: Endoscopy;  Laterality: N/A;   BIOPSY  05/28/2022   Procedure: BIOPSY;  Surgeon: [name], DO;  [facility];  Service: Endoscopy;;   COLONOSCOPY WITH PROPOFOL N/A 12/25/2021   Procedure:  COLONOSCOPY WITH PROPOFOL;  Surgeon: [name], DO;  [facility];  Service: Endoscopy;  Laterality: N/A;  2:30pm   ESOPHAGOGASTRODUODENOSCOPY (EGD) WITH PROPOFOL N/A 05/28/2022   Procedure: ESOPHAGOGASTRODUODENOSCOPY (EGD) WITH PROPOFOL;  Surgeon: [name], DO;  [facility];  Service: Endoscopy;  Laterality: N/A;  10:00am, asa 3   LAPAROSCOPIC APPENDECTOMY N/A 05/20/2018   Procedure: APPENDECTOMY LAPAROSCOPIC;  Surgeon: [name], MD;  Location: [facility];  Service: General;  Laterality: N/A;   POLYPECTOMY  12/25/2021   Procedure: POLYPECTOMY;  Surgeon: [name], DO;  [facility];  Service: Endoscopy;;   PORTACATH PLACEMENT Right 04/14/2018   Procedure: INSERTION PORT-A-CATH;  Surgeon: [name], MD;  Location: [facility];  Service: General;  Laterality: Right;    SOCIAL HISTORY:  Social History   Socioeconomic History   Marital status: Legally Separated    Spouse name: Not on file   Number of children: 6   Years of education: Not on file   Highest education level: Not on file  Occupational History    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  Tobacco Use   Smoking status: Never   Smokeless tobacco: Never  Vaping Use   Vaping Use: Never used  Substance and Sexual Activity   Alcohol use: Never   Drug use: Not Currently   Sexual activity: Not Currently  Other Topics Concern   Not on file  Social History Narrative   Not on file   Social Determinants of Health   Financial Resource Strain: Low Risk  (07/06/2020)   Overall Financial Resource Strain (CARDIA)    Difficulty of Paying Living Expenses: Not hard at all  Food Insecurity: No Food Insecurity (08/14/2022)   Hunger Vital Sign    Worried About Running Out of Food in the Last Year: Never true    Ran Out of Food in the Last Year: Never true  Transportation Needs: No Transportation Needs (08/14/2022)   PRAPARE - Hydrologist (Medical): No    Lack  of Transportation (Non-Medical): No  Physical Activity: Inactive (07/06/2020)   Exercise Vital Sign    Days of Exercise per Week: 0 days    Minutes of Exercise per Session: 0 min  Stress: No Stress Concern Present (07/06/2020)   [place]    Feeling of Stress : Not at all  Social Connections: Moderately Isolated (07/06/2020)   Social Connection and Isolation Panel [NHANES]    Frequency of Communication with Friends and Family: More than three times a week    Frequency of Social Gatherings with Friends and Family: Twice a week    Attends Religious Services: 1 to 4 times per year    Active Member of [employer] or Organizations: No    Attends Archivist Meetings: Never    Marital Status: Separated  Intimate Partner Violence: Not At Risk (08/14/2022)   Humiliation, Afraid, Rape, and Kick questionnaire    Fear of Current or Ex-Partner: No    Emotionally Abused: No    Physically Abused: No    Sexually  Abused: No    FAMILY HISTORY:  Family History  Problem Relation Age of Onset   Heart disease Mother    Emphysema Father    Diabetes Sister    Depression Sister    Cancer Brother        liver, lung, and colon. colon cancer at age 63.   Diabetes Brother     CURRENT MEDICATIONS:  Current Outpatient Medications  Medication Sig Dispense Refill   acetaminophen (TYLENOL) 325 MG tablet Take 650 mg by mouth as needed for moderate pain (prior to infusion).     acyclovir (ZOVIRAX) 400 MG tablet Take 1 tablet (400 mg total) by mouth 2 (two) times daily. 60 tablet 6   albuterol (PROVENTIL) (2.5 MG/3ML) 0.083% nebulizer solution Take 3 mLs (2.5 mg total) by nebulization every 4 (four) hours as needed for wheezing or shortness of breath. 75 mL 2   albuterol (VENTOLIN HFA) 108 (90 Base) MCG/ACT inhaler Inhale 2 puffs into the lungs every 4 (four) hours as needed for wheezing or shortness of breath. 18 g 2   ALPRAZolam (XANAX)  0.5 MG tablet TAKE 1 TABLET BY MOUTH DURING THE DAY AS NEEDED FOR ANXIETY AND 2 TABLETS AT BEDTIME AS NEEDED FOR ANXIETY/INSOMNIA 90 tablet 3   Calcium Carb-Cholecalciferol (CALCIUM 1000 + D PO) Take 1,000 mg by mouth daily.     cetirizine (ZYRTEC) 10 MG tablet Take 10 mg by mouth daily.     dexAMETHasone 20 MG TABS Take 1 tablet by mouth as directed. Take 1 tablet 30 minutes prior to infusions 30 tablet 2   dextromethorphan-guaiFENesin (MUCINEX DM) 30-600 MG 12hr tablet Take 1 tablet by mouth 2 (two) times daily. 20 tablet 0   diphenhydrAMINE (BENADRYL) 25 MG tablet Take 50 mg by mouth as needed (prior to infusion).     ELIQUIS 2.5 MG TABS tablet Take 2.5 mg by mouth 2 (two) times daily.     escitalopram (LEXAPRO) 20 MG tablet Take 20 mg by mouth daily.     fluconazole (DIFLUCAN) 100 MG tablet Take 1 tablet (100 mg total) by mouth daily. Take 2 tablets by mouth on day one and then one tablet daily until complete 6 tablet 0   magnesium oxide (MAG-OX) 400 (240 Mg) MG tablet Take 1 tablet (400 mg total) by mouth 2 (two) times daily. 60 tablet 4   methocarbamol (ROBAXIN) 500 MG tablet Take 1 tablet (500 mg total) by mouth every 8 (eight) hours as needed for muscle spasms (back pain). 20 tablet 0   montelukast (SINGULAIR) 10 MG tablet TAKE 1 TABLET BY MOUTH AS DIRECTED 30 MINUTES PRIOR TO TREATMENT (Patient taking differently: Take 10 mg by mouth See admin instructions. Take 1 tablet by mouth as directed 30 minutes prior to treatment) 90 tablet 0   Multiple Vitamin (MULTI VITAMIN) TABS Take 1 tablet by mouth daily.     Nebulizers (COMPRESSOR/NEBULIZER) MISC 1 Units by Does not apply route 3 (three) times daily as needed. 1 each 0   pantoprazole (PROTONIX) 40 MG tablet Take 1 tablet (40 mg total) by mouth daily. 90 tablet 6   polyethylene glycol (MIRALAX / GLYCOLAX) 17 g packet Take 17 g by mouth daily as needed for moderate constipation. 14 each 0   prochlorperazine (COMPAZINE) 10 MG tablet Take 1 tablet  (10 mg total) by mouth every 6 (six) hours as needed for nausea or vomiting. 30 tablet 0   rosuvastatin (CRESTOR) 10 MG tablet Take 10 mg by mouth daily.  zolpidem (AMBIEN) 5 MG tablet Take 2 tablets (10 mg total) by mouth at bedtime as needed for sleep.     HYDROcodone-acetaminophen (NORCO) 10-325 MG tablet Take 1 tablet by mouth every 6 (six) hours as needed. 120 tablet 0   No current facility-administered medications for this visit.   Facility-Administered Medications Ordered in Other Visits  Medication Dose Route Frequency Provider Last Rate Last Admin   0.9 %  sodium chloride infusion   Intravenous Continuous [name], MD   Stopped at 09/10/22 1057   sodium chloride flush (NS) 0.9 % injection 10 mL  10 mL Intravenous PRN [name], MD   10 mL at 09/10/22 1058    ALLERGIES:  Allergies  Allergen Reactions   Ciprofloxacin Anaphylaxis   Amoxicillin Other (See Comments)    unknown   Morphine And Related Nausea And Vomiting   Augmentin [Amoxicillin-Pot Clavulanate] Other (See Comments)    Headache, insomnia    PHYSICAL EXAM:  Performance status (ECOG): 1 - Symptomatic but completely ambulatory  There were no vitals filed for this visit.  Wt Readings from Last 3 Encounters:  09/10/22 170 lb 9.6 oz (77.4 kg)  09/06/22 173 lb 12.8 oz (78.8 kg)  09/03/22 172 lb 9.9 oz (78.3 kg)   Physical Exam Vitals reviewed.  Constitutional:      Appearance: Normal appearance. She is obese.  Cardiovascular:     Rate and Rhythm: Normal rate and regular rhythm.     Pulses: Normal pulses.     Heart sounds: Normal heart sounds.  Pulmonary:     Effort: Pulmonary effort is normal.     Breath sounds: Normal breath sounds.  Neurological:     General: No focal deficit present.     Mental Status: She is alert and oriented to person, place, and time.  Psychiatric:        Mood and Affect: Mood normal.        Behavior: Behavior normal.      LABORATORY DATA:  I have  reviewed the labs as listed.     Latest Ref Rng & Units 09/10/2022    7:55 AM 09/04/2022    5:40 AM 09/03/2022   11:11 AM  CBC  WBC 4.0 - 10.5 K/uL 6.0  3.9  5.7   Hemoglobin 12.0 - 15.0 g/dL 10.1  8.7  10.7   Hematocrit 36.0 - 46.0 % 31.3  27.1  33.7   Platelets 150 - 400 K/uL 122  58  65       Latest Ref Rng & Units 09/10/2022    7:55 AM 09/04/2022    5:40 AM 09/03/2022   11:11 AM  CMP  Glucose 70 - 99 mg/dL 283  284  224   BUN 8 - 23 mg/dL '27  28  23   '$ Creatinine 0.44 - 1.00 mg/dL 1.81  1.56  1.79   Sodium 135 - 145 mmol/L 132  136  135   Potassium 3.5 - 5.1 mmol/L 4.1  4.3  4.8   Chloride 98 - 111 mmol/L 96  101  99   CO2 22 - 32 mmol/L '24  25  25   '$ Calcium 8.9 - 10.3 mg/dL 8.8  8.2  9.2   Total Protein 6.5 - 8.1 g/dL 6.6   6.8   Total Bilirubin 0.3 - 1.2 mg/dL 0.8   0.3   Alkaline Phos 38 - 126 U/L 64   70   AST 15 - 41 U/L 18   21  ALT 0 - 44 U/L 19   22     DIAGNOSTIC IMAGING:  I have independently reviewed the scans and discussed with the patient. NM Pulmonary Perfusion  Result Date: 09/03/2022 CLINICAL DATA:  Rule out pulmonary embolism. Shortness of breath and chest pain. EXAM: NUCLEAR MEDICINE PERFUSION LUNG SCAN TECHNIQUE: Perfusion images were obtained in multiple projections after intravenous injection of radiopharmaceutical. Ventilation scans intentionally deferred if perfusion scan and chest x-ray adequate for interpretation during COVID 19 epidemic. RADIOPHARMACEUTICALS:  4.4 mCi Tc-68mMAA IV COMPARISON:  Chest radiograph from earlier today FINDINGS: No peripheral segmental perfusion defects identified bilaterally. Heterogeneity on the oblique and lateral projection images is favored to represent sequelae of non-uniform soft tissue attenuation artifact. IMPRESSION: 1. No evidence for acute pulmonary embolus. Electronically Signed   By: TKerby MoorsM.D.   On: 09/03/2022 15:47   CT Chest Wo Contrast  Result Date: 09/03/2022 CLINICAL DATA:  Respiratory illness.   Nondiagnostic x-ray. EXAM: CT CHEST WITHOUT CONTRAST TECHNIQUE: Multidetector CT imaging of the chest was performed following the standard protocol without IV contrast. RADIATION DOSE REDUCTION: This exam was performed according to the departmental dose-optimization program which includes automated exposure control, adjustment of the mA and/or kV according to patient size and/or use of iterative reconstruction technique. COMPARISON:  October 13, 2020 FINDINGS: Cardiovascular: No significant vascular findings. Normal heart size. No pericardial effusion. Right central venous line identified. Mediastinum/Nodes: No enlarged mediastinal or axillary lymph nodes. Trachea, and esophagus demonstrate no significant findings. 4 mm low-density left thyroid nodule is unchanged. Lungs/Pleura: Minimal scar bilaterally noted. Lungs are otherwise clear. No pleural effusion or pneumothorax. Upper Abdomen: Diffuse lucency of liver noted consistent with fatty infiltration of liver. Musculoskeletal: Diffuse lytic lesions throughout the marrow space consistent with clinical history of multiple myeloma unchanged. Degenerative joint changes of the spine. Chronic compression deformity of a lower thoracic vertebral body unchanged. IMPRESSION: 1. No acute abnormality of the chest.  No focal pneumonia. Electronically Signed   By: WAbelardo DieselM.D.   On: 09/03/2022 12:32   CT Head Wo Contrast  Result Date: 09/03/2022 CLINICAL DATA:  Mental status change, unknown cause. Patient complaining of dizziness, weakness, unsteadiness on her feet, arm heaviness in loss of appetite. History of multiple myeloma. EXAM: CT HEAD WITHOUT CONTRAST TECHNIQUE: Contiguous axial images were obtained from the base of the skull through the vertex without intravenous contrast. RADIATION DOSE REDUCTION: This exam was performed according to the departmental dose-optimization program which includes automated exposure control, adjustment of the mA and/or kV  according to patient size and/or use of iterative reconstruction technique. COMPARISON:  MRI brain 03/31/2021. FINDINGS: Brain: No midline shift, ventriculomegaly, mass effect, evidence of mass lesion, intracranial hemorrhage or evidence of cortically based acute infarction. Gray-white matter differentiation is within normal limits throughout the brain. Stable mild chronic small-vessel disease. Vascular: No hyperdense vessel. Skull: Numerous circumscribed, lytic lesions of the calvarium, compatible with known history of multiple myeloma. Sinuses/Orbits: Paranasal sinuses, mastoids and middle ear cavities are well aerated. Orbits are unremarkable. Other: None. IMPRESSION: No acute intracranial abnormalities. Electronically Signed   By: WEmmit AlexandersM.D   On: 09/03/2022 12:25   DG Chest Port 1 View  Result Date: 09/03/2022 CLINICAL DATA:  Weakness EXAM: PORTABLE CHEST 1 VIEW COMPARISON:  Radiograph 08/14/2022 FINDINGS: Chest port catheter tip overlies the mid SVC. Unchanged cardiomediastinal silhouette. There is no focal airspace consolidation. There is no pleural effusion or evidence of pneumothorax. Chronic lower thoracic compression deformity. Chronic left clavicle injury  with prominent callus formation. Chronic left upper rib injury. Thoracic spondylosis. IMPRESSION: No evidence of acute cardiopulmonary disease. Electronically Signed   By: Maurine Simmering M.D.   On: 09/03/2022 10:47   DG Chest 2 View  Result Date: 08/14/2022 CLINICAL DATA:  Two-week history of productive cough, congestion, and chills EXAM: CHEST - 2 VIEW COMPARISON:  Radiograph of the ribs dated 03/27/2021 FINDINGS: Right chest wall port tip projects over the SVC. Normal lung volumes. No focal consolidations. No pleural effusion or pneumothorax. The heart size and mediastinal contours are within normal limits. Old healed fracture of the left clavicle and left posterior ribs. Unchanged T11 compression deformity. IMPRESSION: 1. No active  cardiopulmonary disease. 2. Unchanged T11 compression deformity. Electronically Signed   By: Darrin Nipper M.D.   On: 08/14/2022 14:09     ASSESSMENT:  1.  IgG lambda plasma cell myeloma, stage II, standard risk: -4 cycles of KPD from 04/17/2018 through 08/21/2018, stem cell transplant on 10/23/2018. -PET scan on 01/21/2019 showed multiple bone lesions but without any hypermetabolic activity. -BMBX on 01/21/2019 with normocellular marrow with no increase in plasma cells.  Normal FISH.  MRD results negative. -Maintenance pomalidomide 2 mg 3 weeks on/1 week off started on 03/10/2019. -Bone marrow biopsy on 10/29/2019 shows trilineage hematopoiesis with no evidence of plasma cells.  Chromosome analysis and FISH are normal. -BM BX on 10/27/2020 at [facility] with 30 to 40%, TLH.  2% of the total cells are CD 138+ plasma cells. - Pomalyst was held due to dizziness from 03/29/2021 through 04/26/2021. - MRI of the brain on 03/31/2021 did not show any evidence of intracranial lesions.  Multiple bone lesions in the skull compatible with sequela of myeloma.  - Labs at [facility] on 11/16/2021: M spike 0.24 g.  Immunofixation positive for IgG lambda. - PET scan on 12/14/2021: No evidence of FDG avid osseous or soft tissue myeloma. - She has developed left shoulder blade and right posterior rib pain for the last 3 to 4 months. - 24-hour urine on 12/14/2021: Total protein 136 mg.  Immune immunofixation positive for lambda type Bence-Jones protein. -- PET scan (05/24/2022): Single new hypermetabolic lytic lesion involving left lamina of T11.  No canal encroachment.  No other hypermetabolic bone lesions.  Stable diffuse lytic lesions throughout the axial and appendicular skeleton.  No soft tissue lesions. - Maintenance Pomalyst discontinued on 05/29/2022 - XRT to the T11 completed on 06/20/2022 - Daratumumab, Velcade and dexamethasone started on 06/11/2022  2.  Pulmonary embolism: -CT angiogram on 04/21/2020 showed  filling defect at [facility]. -VQ scan confirmed pulmonary embolism. -She is on Eliquis.   3.  Right shoulder and upper arm pain: - She received XRT to the chest from 12/26/2020 through 01/06/2021.  4.  Myeloma bone disease: - Denosumab held due to exposure of bone in the right lower jaw.  Last dose in 2020.   PLAN:  1.  IgG lambda plasma cell myeloma: - I have reviewed recent hospitalization records. - She reports that her legs tingle on and off.  No tingling in the fingertips or toes reported. - Reviewed labs today which showed creatinine 1.81 slightly higher than her baseline.  Myeloma labs shows M spike is 0.2 g with free light chain ratio of 0.43.  CBC was grossly normal with improved platelet count of 122. - She will proceed with day 8 and 11 of Velcade today and Thursday. - RTC 09/24/2022.  We will continue Darzalex every 2 weeks.  2.  Back pain/left shoulder blade pain/right lateral and posterior rib pains: - Continue hydrocodone 10/325 every 6 hours as needed.   3.  Hypomagnesemia: - Magnesium is 1.5.  She will receive magnesium 2 g today.  Will start magnesium 400 mg twice daily.   4.  Anxiety: - Continue Xanax twice daily as needed.   5.  Sleeping difficulty: - Continue Ambien at bedtime.   6.  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  Hemoglobin is 10.1 today.   7.  Pulmonary embolism: - Continue Eliquis daily.  No bleeding issues.   Orders placed this encounter:  No orders of the defined types were placed in this encounter.      [name], MD [place] [phone]

## 2022-09-10 NOTE — Patient Instructions (Addendum)
[place] at [facility] Discharge Instructions   You were seen and examined today by Dr. Delton Coombes.  He reviewed the results of your lab work which are mostly normal/stable. Your magnesium is low at 1.5. We will give you IV magnesium in the clinic today. We sent a prescription to your pharmacy for magnesium pills. You will take 1 pill twice a day.  We will give you some extra IV fluid today to help with your kidney function.   Return as scheduled.    Thank you for choosing [place] at [facility] to provide your oncology and hematology care.  To afford each patient quality time with our provider, please arrive at least 15 minutes before your scheduled appointment time.   If you have a lab appointment with the [facility] please come in thru the Main Entrance and check in at the main information desk.  You need to re-schedule your appointment should you arrive 10 or more minutes late.  We strive to give you quality time with our providers, and arriving late affects you and other patients whose appointments are after yours.  Also, if you no show three or more times for appointments you may be dismissed from the clinic at the providers discretion.     Again, thank you for choosing [facility].  Our hope is that these requests will decrease the amount of time that you wait before being seen by our physicians.       _____________________________________________________________  Should you have questions after your visit to [facility], please contact our office at [phone] and follow the prompts.  Our office hours are 8:00 a.m. and 4:30 p.m. Monday - Friday.  Please note that voicemails left after 4:00 p.m. may not be returned until the following business day.  We are closed weekends and major holidays.  You do have access to a nurse 24-7, just call the main number to the clinic [phone] and do not press any  options, hold on the line and a nurse will answer the phone.    For prescription refill requests, have your pharmacy contact our office and allow 72 hours.    Due to Covid, you will need to wear a mask upon entering the hospital. If you do not have a mask, a mask will be given to you at the Main Entrance upon arrival. For doctor visits, patients may have 1 support person age 47 or older with them. For treatment visits, patients can not have anyone with them due to social distancing guidelines and our immunocompromised population.

## 2022-09-10 NOTE — Progress Notes (Signed)
Nutrition Assessment   Reason for Assessment: +MST   ASSESSMENT: 77 year old female with multiple myeloma DaraVd q21 + Daratumumab q28d. Patient is under the care of Dr. Delton Coombes. -Pt receiving Velcade only 1/22  Past medical history includes CKD3, HTN, pulmonary embolism, cirrhosis, Hep C, chronic pain, constipation  1/15-1/16 hospital admission - acute metabolic encephalopathy 84/53-64/68 hospital admission - RSV, pulmonary embolism  Met with patient during infusion. Patient reports appetite is not too good. She reports foods have not tasted right since having RSV last month. Patient endorses dysphagia with bread. This is new s/p RSV. Patient recalls 3 small meals. She eats bowl of cereal with 2% milk for breakfast, one can of spaghettios for lunch. Patient does not eat the meatballs. Last night she ate a piece of chicken and potato wedges for dinner. Her son has been making small portion chocolate milk shakes in the evenings. Patient is drinking 2-3 bottles of water. She has been working to increase this. Patient is unable to afford Ensure/Boost. She use to drink these and liked them. Patient denies nausea, vomiting. Patient reports intermittent diarrhea and constipation. She is on daily bowel regimen.   Nutrition Focused Physical Exam: Deferred   Medications: xanax, eliquis, norco, MVI, miralax, compazine, crestor, ambien   Labs: Mg 1.5, Na 132, glucose 283, BUN 27, Cr 1.81   Anthropometrics:   Height: 5'3" Weight: 170 lb 9.6 oz UBW: 181 lb 12.8 oz (08/02/22) BMI: 30.22   NUTRITION DIAGNOSIS: Unintended weight loss related to acute/chronic illness as evidenced by 6% wt loss from usual wt in 4 weeks - severe for time frame    INTERVENTION:  Encouraged snacks in between meals and at bedtime with adequate calories and protein - suggestions offered + handout with ideas given Discussed ways to add calories to foods + tips on altering texture for ease of intake Discussed  strategies for altered taste, suggested baking soda salt water rinses several times daily in between meals - handout with tips + recipe given Encouraged Ensure Plus/equivalent BID One complimentary case Ensure Plus HP given Contact information     MONITORING, EVALUATION, GOAL: weight trends, intake    Next Visit: Monday February 19 during infusion

## 2022-09-10 NOTE — Patient Instructions (Signed)
[facility]  Discharge Instructions: Thank you for choosing [facility] to provide your oncology and hematology care.  If you have a lab appointment with the [facility], please come in thru the Main Entrance and check in at the main information desk.  Wear comfortable clothing and clothing appropriate for easy access to any Portacath or PICC line.   We strive to give you quality time with your provider. You may need to reschedule your appointment if you arrive late (15 or more minutes).  Arriving late affects you and other patients whose appointments are after yours.  Also, if you miss three or more appointments without notifying the office, you may be dismissed from the clinic at the provider's discretion.      For prescription refill requests, have your pharmacy contact our office and allow 72 hours for refills to be completed.    Today you received the following chemotherapy and/or immunotherapy agents darzalex SQ   To help prevent nausea and vomiting after your treatment, we encourage you to take your nausea medication as directed.  BELOW ARE SYMPTOMS THAT SHOULD BE REPORTED IMMEDIATELY: *FEVER GREATER THAN 100.4 F (38 C) OR HIGHER *CHILLS OR SWEATING *NAUSEA AND VOMITING THAT IS NOT CONTROLLED WITH YOUR NAUSEA MEDICATION *UNUSUAL SHORTNESS OF BREATH *UNUSUAL BRUISING OR BLEEDING *URINARY PROBLEMS (pain or burning when urinating, or frequent urination) *BOWEL PROBLEMS (unusual diarrhea, constipation, pain near the anus) TENDERNESS IN MOUTH AND THROAT WITH OR WITHOUT PRESENCE OF ULCERS (sore throat, sores in mouth, or a toothache) UNUSUAL RASH, SWELLING OR PAIN  UNUSUAL VAGINAL DISCHARGE OR ITCHING   Items with * indicate a potential emergency and should be followed up as soon as possible or go to the Emergency Department if any problems should occur.  Please show the CHEMOTHERAPY ALERT CARD or IMMUNOTHERAPY ALERT CARD at check-in to the  Emergency Department and triage nurse.  Should you have questions after your visit or need to cancel or reschedule your appointment, please contact [facility] [phone]  and follow the prompts.  Office hours are 8:00 a.m. to 4:30 p.m. Monday - Friday. Please note that voicemails left after 4:00 p.m. may not be returned until the following business day.  We are closed weekends and major holidays. You have access to a nurse at all times for urgent questions. Please call the main number to the clinic [phone] and follow the prompts.  For any non-urgent questions, you may also contact your provider using MyChart. We now offer e-Visits for anyone 84 and older to request care online for non-urgent symptoms. For details visit [email or web address].   Also download the MyChart app! Go to the app store, search "MyChart", open the app, select [facility], and log in with your MyChart username and password.

## 2022-09-11 ENCOUNTER — Inpatient Hospital Stay: Payer: 59

## 2022-09-11 ENCOUNTER — Other Ambulatory Visit: Payer: Self-pay

## 2022-09-11 LAB — MULTIPLE MYELOMA PANEL, SERUM
Albumin SerPl Elph-Mcnc: 3.3 g/dL (ref 2.9–4.4)
Albumin/Glob SerPl: 1.4 (ref 0.7–1.7)
Alpha 1: 0.3 g/dL (ref 0.0–0.4)
Alpha2 Glob SerPl Elph-Mcnc: 0.9 g/dL (ref 0.4–1.0)
B-Globulin SerPl Elph-Mcnc: 0.8 g/dL (ref 0.7–1.3)
Gamma Glob SerPl Elph-Mcnc: 0.5 g/dL (ref 0.4–1.8)
Globulin, Total: 2.4 g/dL (ref 2.2–3.9)
IgA: 16 mg/dL — ABNORMAL LOW (ref 64–422)
IgG (Immunoglobin G), Serum: 502 mg/dL — ABNORMAL LOW (ref 586–1602)
IgM (Immunoglobulin M), Srm: 7 mg/dL — ABNORMAL LOW (ref 26–217)
M Protein SerPl Elph-Mcnc: 0.1 g/dL — ABNORMAL HIGH
Total Protein ELP: 5.7 g/dL — ABNORMAL LOW (ref 6.0–8.5)

## 2022-09-13 ENCOUNTER — Ambulatory Visit: Payer: Medicare Other

## 2022-09-13 ENCOUNTER — Ambulatory Visit: Payer: 59

## 2022-09-13 ENCOUNTER — Inpatient Hospital Stay: Payer: 59

## 2022-09-13 ENCOUNTER — Other Ambulatory Visit: Payer: Self-pay

## 2022-09-13 VITALS — BP 129/84 | HR 102 | Temp 96.8°F | Resp 18 | Wt 167.6 lb

## 2022-09-13 DIAGNOSIS — C9000 Multiple myeloma not having achieved remission: Secondary | ICD-10-CM

## 2022-09-13 DIAGNOSIS — Z5112 Encounter for antineoplastic immunotherapy: Secondary | ICD-10-CM | POA: Diagnosis not present

## 2022-09-13 MED ORDER — BORTEZOMIB CHEMO SQ INJECTION 3.5 MG (2.5MG/ML)
1.3000 mg/m2 | Freq: Once | INTRAMUSCULAR | Status: AC
  Administered 2022-09-13: 2.5 mg via SUBCUTANEOUS
  Filled 2022-09-13: qty 1

## 2022-09-13 NOTE — Patient Instructions (Signed)
[facility]  Discharge Instructions: Thank you for choosing [facility] to provide your oncology and hematology care.  If you have a lab appointment with the [facility], please come in thru the Main Entrance and check in at the main information desk.  Wear comfortable clothing and clothing appropriate for easy access to any Portacath or PICC line.   We strive to give you quality time with your provider. You may need to reschedule your appointment if you arrive late (15 or more minutes).  Arriving late affects you and other patients whose appointments are after yours.  Also, if you miss three or more appointments without notifying the office, you may be dismissed from the clinic at the provider's discretion.      For prescription refill requests, have your pharmacy contact our office and allow 72 hours for refills to be completed.    Today you received the following chemotherapy and/or immunotherapy agents Velcade.  Bortezomib Injection What is this medication? BORTEZOMIB (bor TEZ oh mib) treats lymphoma. It may also be used to treat multiple myeloma, a type of bone marrow cancer. It works by blocking a protein that causes cancer cells to grow and multiply. This helps to slow or stop the spread of cancer cells. This medicine may be used for other purposes; ask your health care provider or pharmacist if you have questions. COMMON BRAND NAME(S): Velcade What should I tell my care team before I take this medication? They need to know if you have any of these conditions: Dehydration Diabetes Heart disease Liver disease Tingling of the fingers or toes or other nerve disorder An unusual or allergic reaction to bortezomib, other medications, foods, dyes, or preservatives If you or your partner are pregnant or trying to get pregnant Breastfeeding How should I use this medication? This medication is injected into a vein or under the skin. It is given by your  care team in a hospital or clinic setting. Talk to your care team about the use of this medication in children. Special care may be needed. Overdosage: If you think you have taken too much of this medicine contact a poison control center or emergency room at once. NOTE: This medicine is only for you. Do not share this medicine with others. What if I miss a dose? Keep appointments for follow-up doses. It is important not to miss your dose. Call your care team if you are unable to keep an appointment. What may interact with this medication? Ketoconazole Rifampin This list may not describe all possible interactions. Give your health care provider a list of all the medicines, herbs, non-prescription drugs, or dietary supplements you use. Also tell them if you smoke, drink alcohol, or use illegal drugs. Some items may interact with your medicine. What should I watch for while using this medication? Your condition will be monitored carefully while you are receiving this medication. You may need blood work while taking this medication. This medication may affect your coordination, reaction time, or judgment. Do not drive or operate machinery until you know how this medication affects you. Sit up or stand slowly to reduce the risk of dizzy or fainting spells. Drinking alcohol with this medication can increase the risk of these side effects. This medication may increase your risk of getting an infection. Call your care team for advice if you get a fever, chills, sore throat, or other symptoms of a cold or flu. Do not treat yourself. Try to avoid being around  people who are sick. Check with your care team if you have severe diarrhea, nausea, and vomiting, or if you sweat a lot. The loss of too much body fluid may make it dangerous for you to take this medication. Talk to your care team if you may be pregnant. Serious birth defects can occur if you take this medication during pregnancy and for 7 months after the  last dose. You will need a negative pregnancy test before starting this medication. Contraception is recommended while taking this medication and for 7 months after the last dose. Your care team can help you find the option that works for you. If your partner can get pregnant, use a condom during sex while taking this medication and for 4 months after the last dose. Do not breastfeed while taking this medication and for 2 months after the last dose. This medication may cause infertility. Talk to your care team if you are concerned about your fertility. What side effects may I notice from receiving this medication? Side effects that you should report to your care team as soon as possible: Allergic reactions--skin rash, itching, hives, swelling of the face, lips, tongue, or throat Bleeding--bloody or black, tar-like stools, vomiting blood or [employer] material that looks like coffee grounds, red or dark Shynice Sigel urine, small red or purple spots on skin, unusual bruising or bleeding Bleeding in the brain--severe headache, stiff neck, confusion, dizziness, change in vision, numbness or weakness of the face, arm, or leg, trouble speaking, trouble walking, vomiting Bowel blockage--stomach cramping, unable to have a bowel movement or pass gas, loss of appetite, vomiting Heart failure--shortness of breath, swelling of the ankles, feet, or hands, sudden weight gain, unusual weakness or fatigue Infection--fever, chills, cough, sore throat, wounds that don't heal, pain or trouble when passing urine, general feeling of discomfort or being unwell Liver injury--right upper belly pain, loss of appetite, nausea, light-colored stool, dark yellow or Stephon Weathers urine, yellowing skin or eyes, unusual weakness or fatigue Low blood pressure--dizziness, feeling faint or lightheaded, blurry vision Lung injury--shortness of breath or trouble breathing, cough, spitting up blood, chest pain, fever Pain, tingling, or numbness in the hands  or feet Severe or prolonged diarrhea Stomach pain, bloody diarrhea, pale skin, unusual weakness or fatigue, decrease in the amount of urine, which may be signs of hemolytic uremic syndrome Sudden and severe headache, confusion, change in vision, seizures, which may be signs of posterior reversible encephalopathy syndrome (PRES) TTP--purple spots on the skin or inside the mouth, pale skin, yellowing skin or eyes, unusual weakness or fatigue, fever, fast or irregular heartbeat, confusion, change in vision, trouble speaking, trouble walking Tumor lysis syndrome (TLS)--nausea, vomiting, diarrhea, decrease in the amount of urine, dark urine, unusual weakness or fatigue, confusion, muscle pain or cramps, fast or irregular heartbeat, joint pain Side effects that usually do not require medical attention (report to your care team if they continue or are bothersome): Constipation Diarrhea Fatigue Loss of appetite Nausea This list may not describe all possible side effects. Call your doctor for medical advice about side effects. You may report side effects to FDA at 1-800-FDA-1088. Where should I keep my medication? This medication is given in a hospital or clinic. It will not be stored at home. NOTE: This sheet is a summary. It may not cover all possible information. If you have questions about this medicine, talk to your doctor, pharmacist, or health care provider.  2023 Elsevier/Gold Standard (2022-01-03 00:00:00)        To help  prevent nausea and vomiting after your treatment, we encourage you to take your nausea medication as directed.  BELOW ARE SYMPTOMS THAT SHOULD BE REPORTED IMMEDIATELY: *FEVER GREATER THAN 100.4 F (38 C) OR HIGHER *CHILLS OR SWEATING *NAUSEA AND VOMITING THAT IS NOT CONTROLLED WITH YOUR NAUSEA MEDICATION *UNUSUAL SHORTNESS OF BREATH *UNUSUAL BRUISING OR BLEEDING *URINARY PROBLEMS (pain or burning when urinating, or frequent urination) *BOWEL PROBLEMS (unusual diarrhea,  constipation, pain near the anus) TENDERNESS IN MOUTH AND THROAT WITH OR WITHOUT PRESENCE OF ULCERS (sore throat, sores in mouth, or a toothache) UNUSUAL RASH, SWELLING OR PAIN  UNUSUAL VAGINAL DISCHARGE OR ITCHING   Items with * indicate a potential emergency and should be followed up as soon as possible or go to the Emergency Department if any problems should occur.  Please show the CHEMOTHERAPY ALERT CARD or IMMUNOTHERAPY ALERT CARD at check-in to the Emergency Department and triage nurse.  Should you have questions after your visit or need to cancel or reschedule your appointment, please contact [facility] [phone]  and follow the prompts.  Office hours are 8:00 a.m. to 4:30 p.m. Monday - Friday. Please note that voicemails left after 4:00 p.m. may not be returned until the following business day.  We are closed weekends and major holidays. You have access to a nurse at all times for urgent questions. Please call the main number to the clinic [phone] and follow the prompts.  For any non-urgent questions, you may also contact your provider using MyChart. We now offer e-Visits for anyone 45 and older to request care online for non-urgent symptoms. For details visit [email or web address].   Also download the MyChart app! Go to the app store, search "MyChart", open the app, select [facility], and log in with your MyChart username and password.

## 2022-09-13 NOTE — Progress Notes (Signed)
Patient presents today for Velcade injection.  Patient is in satisfactory condition with no new complaints voiced.  HR is 102 today.  MD made aware.  All other vital signs are stable.  Labs drawn on 09/10/2022.  All labs are within treatment parameters.  We will proceed with injection per MD orders.   Patient tolerated Velcade injection with no complaints voiced.  Site clean and dry with no bruising or swelling noted.  No complaints of pain.  Discharged with vital signs stable and no signs or symptoms of distress noted.

## 2022-09-14 ENCOUNTER — Emergency Department (HOSPITAL_COMMUNITY): Payer: 59

## 2022-09-14 ENCOUNTER — Emergency Department (HOSPITAL_COMMUNITY)
Admission: EM | Admit: 2022-09-14 | Discharge: 2022-09-14 | Disposition: A | Discharge status: Home / Self Care | Payer: 59 | Attending: Emergency Medicine | Admitting: Emergency Medicine

## 2022-09-14 ENCOUNTER — Other Ambulatory Visit: Payer: Self-pay

## 2022-09-14 ENCOUNTER — Encounter (HOSPITAL_COMMUNITY): Payer: Self-pay | Admitting: *Deleted

## 2022-09-14 ENCOUNTER — Inpatient Hospital Stay: Payer: 59

## 2022-09-14 DIAGNOSIS — R197 Diarrhea, unspecified: Secondary | ICD-10-CM | POA: Insufficient documentation

## 2022-09-14 DIAGNOSIS — Z7901 Long term (current) use of anticoagulants: Secondary | ICD-10-CM | POA: Diagnosis not present

## 2022-09-14 DIAGNOSIS — R1011 Right upper quadrant pain: Secondary | ICD-10-CM | POA: Insufficient documentation

## 2022-09-14 LAB — CBC WITH DIFFERENTIAL/PLATELET
Abs Immature Granulocytes: 0.04 10*3/uL (ref 0.00–0.07)
Basophils Absolute: 0 10*3/uL (ref 0.0–0.1)
Basophils Relative: 0 %
Eosinophils Absolute: 0 10*3/uL (ref 0.0–0.5)
Eosinophils Relative: 0 %
HCT: 31 % — ABNORMAL LOW (ref 36.0–46.0)
Hemoglobin: 10 g/dL — ABNORMAL LOW (ref 12.0–15.0)
Immature Granulocytes: 1 %
Lymphocytes Relative: 8 %
Lymphs Abs: 0.4 10*3/uL — ABNORMAL LOW (ref 0.7–4.0)
MCH: 33 pg (ref 26.0–34.0)
MCHC: 32.3 g/dL (ref 30.0–36.0)
MCV: 102.3 fL — ABNORMAL HIGH (ref 80.0–100.0)
Monocytes Absolute: 0.5 10*3/uL (ref 0.1–1.0)
Monocytes Relative: 10 %
Neutro Abs: 3.8 10*3/uL (ref 1.7–7.7)
Neutrophils Relative %: 81 %
Platelets: 80 10*3/uL — ABNORMAL LOW (ref 150–400)
RBC: 3.03 MIL/uL — ABNORMAL LOW (ref 3.87–5.11)
RDW: 14.2 % (ref 11.5–15.5)
WBC: 4.8 10*3/uL (ref 4.0–10.5)
nRBC: 0 % (ref 0.0–0.2)

## 2022-09-14 LAB — COMPREHENSIVE METABOLIC PANEL
ALT: 22 U/L (ref 0–44)
AST: 20 U/L (ref 15–41)
Albumin: 3.7 g/dL (ref 3.5–5.0)
Alkaline Phosphatase: 64 U/L (ref 38–126)
Anion gap: 11 (ref 5–15)
BUN: 32 mg/dL — ABNORMAL HIGH (ref 8–23)
CO2: 27 mmol/L (ref 22–32)
Calcium: 9 mg/dL (ref 8.9–10.3)
Chloride: 96 mmol/L — ABNORMAL LOW (ref 98–111)
Creatinine, Ser: 1.9 mg/dL — ABNORMAL HIGH (ref 0.44–1.00)
GFR, Estimated: 27 mL/min — ABNORMAL LOW (ref >60)
Glucose, Bld: 180 mg/dL — ABNORMAL HIGH (ref 70–99)
Potassium: 3.6 mmol/L (ref 3.5–5.1)
Sodium: 134 mmol/L — ABNORMAL LOW (ref 135–145)
Total Bilirubin: 0.5 mg/dL (ref 0.3–1.2)
Total Protein: 6.5 g/dL (ref 6.5–8.1)

## 2022-09-14 LAB — URINALYSIS, ROUTINE W REFLEX MICROSCOPIC
Bilirubin Urine: NEGATIVE
Glucose, UA: NEGATIVE mg/dL
Hgb urine dipstick: NEGATIVE
Ketones, ur: NEGATIVE mg/dL
Leukocytes,Ua: NEGATIVE
Nitrite: NEGATIVE
Protein, ur: NEGATIVE mg/dL
Specific Gravity, Urine: 1.018 (ref 1.005–1.030)
pH: 5 (ref 5.0–8.0)

## 2022-09-14 LAB — CBG MONITORING, ED: Glucose-Capillary: 202 mg/dL — ABNORMAL HIGH (ref 70–99)

## 2022-09-14 LAB — LIPASE, BLOOD: Lipase: 46 U/L (ref 11–51)

## 2022-09-14 LAB — LACTIC ACID, PLASMA: Lactic Acid, Venous: 0.9 mmol/L (ref 0.5–1.9)

## 2022-09-14 MED ORDER — IOHEXOL 9 MG/ML PO SOLN
ORAL | Status: AC
  Filled 2022-09-14: qty 500

## 2022-09-14 MED ORDER — HEPARIN SOD (PORK) LOCK FLUSH 100 UNIT/ML IV SOLN
500.0000 [IU] | Freq: Once | INTRAVENOUS | Status: DC
  Filled 2022-09-14: qty 5

## 2022-09-14 NOTE — ED Provider Notes (Signed)
[facility] Provider Note   CSN: [id number] Arrival date & time: 09/14/22  [id number]     History  Chief Complaint  Patient presents with   Abdominal Pain    Alexandra Price is a 77 y.o. female.  Pt complains of right sided abdominal pain.  Pt reports she has been having pain for the last 3 days ago. Pt has a histroy of multiple myeloma.  Pt reports she is followed by Dr. Delton Coombes.    The history is provided by the patient. No language interpreter was used.  Abdominal Pain Pain location:  Generalized Pain quality: aching   Pain radiates to:  Does not radiate Pain severity:  Moderate Onset quality:  Gradual Timing:  Constant Progression:  Worsening Chronicity:  New Context: not recent illness   Relieved by:  Nothing Worsened by:  Nothing Ineffective treatments:  None tried Associated symptoms: no fever   Risk factors: being elderly   Risk factors: has not had multiple surgeries        Home Medications Prior to Admission medications   Medication Sig Start Date End Date Taking? Authorizing Provider  acetaminophen (TYLENOL) 325 MG tablet Take 650 mg by mouth as needed for moderate pain (prior to infusion).    [provider]  acyclovir (ZOVIRAX) 400 MG tablet Take 1 tablet (400 mg total) by mouth 2 (two) times daily. 06/22/22   Harriett Rush, [name]  albuterol (PROVENTIL) (2.5 MG/3ML) 0.083% nebulizer solution Take 3 mLs (2.5 mg total) by nebulization every 4 (four) hours as needed for wheezing or shortness of breath. 08/17/22 08/17/23  Roxan Hockey, MD  albuterol (VENTOLIN HFA) 108 (90 Base) MCG/ACT inhaler Inhale 2 puffs into the lungs every 4 (four) hours as needed for wheezing or shortness of breath. 08/17/22   Roxan Hockey, MD  ALPRAZolam (XANAX) 0.5 MG tablet TAKE 1 TABLET BY MOUTH DURING THE DAY AS NEEDED FOR ANXIETY AND 2 TABLETS AT BEDTIME AS NEEDED FOR ANXIETY/INSOMNIA 08/23/22   [name], MD   Calcium Carb-Cholecalciferol (CALCIUM 1000 + D PO) Take 1,000 mg by mouth daily.    [provider]  cetirizine (ZYRTEC) 10 MG tablet Take 10 mg by mouth daily. 07/19/22   [provider]  dexAMETHasone 20 MG TABS Take 1 tablet by mouth as directed. Take 1 tablet 30 minutes prior to infusions 05/30/22   [name], MD  dextromethorphan-guaiFENesin [facility] DM) 30-600 MG 12hr tablet Take 1 tablet by mouth 2 (two) times daily. 08/17/22   Roxan Hockey, MD  diphenhydrAMINE (BENADRYL) 25 MG tablet Take 50 mg by mouth as needed (prior to infusion).    [provider]  ELIQUIS 2.5 MG TABS tablet Take 2.5 mg by mouth 2 (two) times daily. 06/14/22   [provider]  escitalopram (LEXAPRO) 20 MG tablet Take 20 mg by mouth daily. 12/18/21   [provider]  fluconazole (DIFLUCAN) 100 MG tablet Take 1 tablet (100 mg total) by mouth daily. Take 2 tablets by mouth on day one and then one tablet daily until complete 08/27/22   [name], MD  HYDROcodone-acetaminophen [facility]) 10-325 MG tablet Take 1 tablet by mouth every 6 (six) hours as needed. 09/10/22   [name], MD  magnesium oxide (MAG-OX) 400 (240 Mg) MG tablet Take 1 tablet (400 mg total) by mouth 2 (two) times daily. 09/10/22   [name], MD  methocarbamol (ROBAXIN) 500 MG tablet Take 1 tablet (500 mg total) by mouth every 8 (  eight) hours as needed for muscle spasms (back pain). 08/02/22   Mesner, Corene Cornea, MD  montelukast (SINGULAIR) 10 MG tablet TAKE 1 TABLET BY MOUTH AS DIRECTED 30 MINUTES PRIOR TO TREATMENT Patient taking differently: Take 10 mg by mouth See admin instructions. Take 1 tablet by mouth as directed 30 minutes prior to treatment 06/21/22   [name], MD  Multiple Vitamin (MULTI VITAMIN) TABS Take 1 tablet by mouth daily.    [provider]  Nebulizers (COMPRESSOR/NEBULIZER) MISC 1 Units by Does not apply route 3 (three) times daily as  needed. 08/17/22   Roxan Hockey, MD  pantoprazole (PROTONIX) 40 MG tablet Take 1 tablet (40 mg total) by mouth daily. 02/12/22   [name], MD  polyethylene glycol (MIRALAX / GLYCOLAX) 17 g packet Take 17 g by mouth daily as needed for moderate constipation. 08/04/22   Orpah Greek, MD  prochlorperazine (COMPAZINE) 10 MG tablet Take 1 tablet (10 mg total) by mouth every 6 (six) hours as needed for nausea or vomiting. 06/21/22   [name], MD  rosuvastatin (CRESTOR) 10 MG tablet Take 10 mg by mouth daily. 05/30/20   [provider]  zolpidem (AMBIEN) 5 MG tablet Take 2 tablets (10 mg total) by mouth at bedtime as needed for sleep. 09/04/22   Orson Eva, MD      Allergies    Ciprofloxacin, Amoxicillin, Morphine and related, and Augmentin [amoxicillin-pot clavulanate]    Review of Systems   Review of Systems  Constitutional:  Negative for fever.  Gastrointestinal:  Positive for abdominal pain.  All other systems reviewed and are negative.   Physical Exam Updated Vital Signs BP 138/87   Pulse 96   Temp 98.3 F (36.8 C) (Oral)   Resp 19   Ht '5\' 3"'$  (1.6 m)   Wt 76 kg   SpO2 94%   BMI 29.68 kg/m  Physical Exam Vitals and nursing note reviewed.  Constitutional:      Appearance: She is well-developed.  HENT:     Head: Normocephalic.     Mouth/Throat:     Mouth: Mucous membranes are moist.  Eyes:     Extraocular Movements: Extraocular movements intact.     Pupils: Pupils are equal, round, and reactive to light.  Cardiovascular:     Rate and Rhythm: Normal rate and regular rhythm.  Pulmonary:     Effort: Pulmonary effort is normal.  Abdominal:     General: Abdomen is flat. Bowel sounds are normal. There is no distension.     Palpations: Abdomen is soft.     Tenderness: There is abdominal tenderness.  Musculoskeletal:        General: Normal range of motion.     Cervical back: Normal range of motion.  Skin:    General: Skin is warm.   Neurological:     General: No focal deficit present.     Mental Status: She is alert and oriented to person, place, and time.  Psychiatric:        Mood and Affect: Mood normal.     ED Results / Procedures / Treatments   Labs (all labs ordered are listed, but only abnormal results are displayed) Labs Reviewed  CBC WITH DIFFERENTIAL/PLATELET - Abnormal; Notable for the following components:      Result Value   RBC 3.03 (*)    Hemoglobin 10.0 (*)    HCT 31.0 (*)    MCV 102.3 (*)    Platelets 80 (*)    Lymphs Abs  0.4 (*)    All other components within normal limits  COMPREHENSIVE METABOLIC PANEL - Abnormal; Notable for the following components:   Sodium 134 (*)    Chloride 96 (*)    Glucose, Bld 180 (*)    BUN 32 (*)    Creatinine, Ser 1.90 (*)    GFR, Estimated 27 (*)    All other components within normal limits  CBG MONITORING, ED - Abnormal; Notable for the following components:   Glucose-Capillary 202 (*)    All other components within normal limits  URINALYSIS, ROUTINE W REFLEX MICROSCOPIC  LIPASE, BLOOD  LACTIC ACID, PLASMA  LACTIC ACID, PLASMA    EKG None  Radiology US Abdomen Complete  Result Date: 09/14/2022 CLINICAL DATA:  Abdominal pain for 3 days with diarrhea. Hysterectomy and appendectomy. EXAM: ABDOMEN ULTRASOUND COMPLETE COMPARISON:  Abdominal ultrasound 05/04/2022. Abdominopelvic CT 09/14/2022. FINDINGS: Gallbladder: No gallstones or wall thickening visualized. No sonographic Murphy sign noted by sonographer. Common bile duct: Diameter: 3 mm Liver: The panic echogenicity is diffusely increased, consistent with steatosis as demonstrated on earlier CT. No focal abnormalities are identified. Portal vein is patent on color Doppler imaging with normal direction of blood flow towards the liver. IVC: No abnormality visualized. Pancreas: Largely obscured by bowel gas.  Seen on previous CT. Spleen: Size and appearance within normal limits. Right Kidney: Length: 8.3  cm. Mildly increased cortical echogenicity. No mass or hydronephrosis visualized. Left Kidney: Length: 10.3 cm. Mildly increased cortical echogenicity. No mass or hydronephrosis visualized. Abdominal aorta: No aneurysm visualized. Other findings: Study limited by body habitus. No ascites identified. IMPRESSION: 1. No acute abdominal process identified. 2. Hepatic steatosis. 3. Mildly increased renal cortical echogenicity, correlate for medical renal disease. Electronically Signed   By: Richardean Sale M.D.   On: 09/14/2022 14:30   CT ABDOMEN PELVIS WO CONTRAST  Result Date: 09/14/2022 CLINICAL DATA:  Acute right-sided abdominal pain. History of multiple myeloma. EXAM: CT ABDOMEN AND PELVIS WITHOUT CONTRAST TECHNIQUE: Multidetector CT imaging of the abdomen and pelvis was performed following the standard protocol without IV contrast. RADIATION DOSE REDUCTION: This exam was performed according to the departmental dose-optimization program which includes automated exposure control, adjustment of the mA and/or kV according to patient size and/or use of iterative reconstruction technique. COMPARISON:  August 04, 2022. FINDINGS: Lower chest: No acute abnormality. Hepatobiliary: No cholelithiasis or biliary dilatation is noted. Hepatic steatosis. Pancreas: Unremarkable. No pancreatic ductal dilatation or surrounding inflammatory changes. Spleen: Normal in size without focal abnormality. Adrenals/Urinary Tract: Adrenal glands are unremarkable. Kidneys are normal, without renal calculi, focal lesion, or hydronephrosis. Bladder is unremarkable. Stomach/Bowel: Stomach is unremarkable. There is no evidence of bowel obstruction or inflammation. Status post appendectomy. Vascular/Lymphatic: Aortic atherosclerosis. No enlarged abdominal or pelvic lymph nodes. Reproductive: Status post hysterectomy. No adnexal masses. Other: No abdominal wall hernia or abnormality. No abdominopelvic ascites. Musculoskeletal: Old T11 and L4  fractures are noted. No acute fracture or spondylolisthesis is noted. There is again noted diffuse permeative osteopenia consistent with history of multiple myeloma. IMPRESSION: Hepatic steatosis. No acute abnormality seen in the abdomen or pelvis. Stable diffuse permeative osteopenia throughout the visualized skeleton consistent with history of multiple myeloma. Aortic Atherosclerosis (ICD10-I70.0). Electronically Signed   By: Marijo Conception M.D.   On: 09/14/2022 13:32    Procedures Procedures    Medications Ordered in ED Medications  iohexol (OMNIPAQUE) 9 MG/ML oral solution (has no administration in time range)    ED Course/ Medical Decision Making/ A&P  Medical Decision Making Pt complains of right sided abdominal pain and middle abdominal pain.  Pt has a history of multiple myeloma.   Amount and/or Complexity of Data Reviewed Independent Historian: spouse    Details: Pt is shere with her husband who is supportive  Labs: ordered. Decision-making details documented in ED Course.    Details: Labs ordered reviewed and interpreted.  Hemoglobin is 10.  Glucose is elevated at 210,  BUN is 32.  Creatine is 1.79 Lactate is normal   Radiology: ordered and independent interpretation performed. Decision-making details documented in ED Course.    Details: Ct abdomen whow Hemoglobin or 10, glucose of 210 Bun is 32 and creatine if 1.79 ECG/medicine tests: ordered and independent interpretation performed. Discussion of management or test interpretation with external provider(s): Ct abdomen shows old compression fractures. No acute abnormality  Ultrasound  no acute abdominal process  Risk OTC drugs. Parenteral controlled substances.           Final Clinical Impression(s) / ED Diagnoses Final diagnoses:  Right upper quadrant abdominal pain    Rx / DC Orders ED Discharge Orders     None     An After Visit Summary was printed and given to the  patient.     Sidney Ace 09/14/22 [id number]    Noemi Chapel, MD 09/14/22 2124

## 2022-09-14 NOTE — ED Triage Notes (Signed)
Pt c/o right side abdomial pain and back pain x 3 days;  Pt having diarrhea  Pt denies any fevers and cough

## 2022-09-14 NOTE — Discharge Instructions (Signed)
Schedule to see your Physician for recheck.  Return if any problems.

## 2022-09-16 NOTE — Progress Notes (Signed)
She was hypoxic. CHF in differential.   Alexandra Price

## 2022-09-17 ENCOUNTER — Observation Stay (HOSPITAL_COMMUNITY): Payer: 59

## 2022-09-17 ENCOUNTER — Ambulatory Visit: Payer: Medicare Other

## 2022-09-17 ENCOUNTER — Other Ambulatory Visit: Payer: Self-pay

## 2022-09-17 ENCOUNTER — Other Ambulatory Visit: Payer: Medicare Other

## 2022-09-17 ENCOUNTER — Emergency Department (HOSPITAL_COMMUNITY): Payer: 59

## 2022-09-17 ENCOUNTER — Inpatient Hospital Stay: Payer: 59

## 2022-09-17 ENCOUNTER — Inpatient Hospital Stay (HOSPITAL_COMMUNITY)
Admission: EM | Admit: 2022-09-17 | Discharge: 2022-09-21 | DRG: 312 | Disposition: A | Discharge status: Skilled Nursing Facility | Payer: 59 | Attending: Internal Medicine | Admitting: Internal Medicine

## 2022-09-17 ENCOUNTER — Encounter (HOSPITAL_COMMUNITY): Payer: Self-pay | Admitting: Emergency Medicine

## 2022-09-17 ENCOUNTER — Inpatient Hospital Stay: Payer: 59 | Admitting: Hematology

## 2022-09-17 ENCOUNTER — Telehealth: Payer: Self-pay | Admitting: *Deleted

## 2022-09-17 DIAGNOSIS — I951 Orthostatic hypotension: Secondary | ICD-10-CM | POA: Diagnosis not present

## 2022-09-17 DIAGNOSIS — N184 Chronic kidney disease, stage 4 (severe): Secondary | ICD-10-CM | POA: Diagnosis present

## 2022-09-17 DIAGNOSIS — Z8249 Family history of ischemic heart disease and other diseases of the circulatory system: Secondary | ICD-10-CM

## 2022-09-17 DIAGNOSIS — I10 Essential (primary) hypertension: Secondary | ICD-10-CM | POA: Diagnosis present

## 2022-09-17 DIAGNOSIS — D631 Anemia in chronic kidney disease: Secondary | ICD-10-CM | POA: Diagnosis present

## 2022-09-17 DIAGNOSIS — Z9221 Personal history of antineoplastic chemotherapy: Secondary | ICD-10-CM

## 2022-09-17 DIAGNOSIS — Z90710 Acquired absence of both cervix and uterus: Secondary | ICD-10-CM

## 2022-09-17 DIAGNOSIS — Z86711 Personal history of pulmonary embolism: Secondary | ICD-10-CM

## 2022-09-17 DIAGNOSIS — E669 Obesity, unspecified: Secondary | ICD-10-CM | POA: Diagnosis present

## 2022-09-17 DIAGNOSIS — R55 Syncope and collapse: Secondary | ICD-10-CM | POA: Diagnosis not present

## 2022-09-17 DIAGNOSIS — F419 Anxiety disorder, unspecified: Secondary | ICD-10-CM

## 2022-09-17 DIAGNOSIS — N179 Acute kidney failure, unspecified: Secondary | ICD-10-CM | POA: Diagnosis not present

## 2022-09-17 DIAGNOSIS — C9000 Multiple myeloma not having achieved remission: Secondary | ICD-10-CM | POA: Diagnosis present

## 2022-09-17 DIAGNOSIS — Z6829 Body mass index (BMI) 29.0-29.9, adult: Secondary | ICD-10-CM

## 2022-09-17 DIAGNOSIS — E876 Hypokalemia: Secondary | ICD-10-CM | POA: Diagnosis not present

## 2022-09-17 DIAGNOSIS — Z833 Family history of diabetes mellitus: Secondary | ICD-10-CM

## 2022-09-17 DIAGNOSIS — F418 Other specified anxiety disorders: Secondary | ICD-10-CM | POA: Diagnosis present

## 2022-09-17 DIAGNOSIS — I2699 Other pulmonary embolism without acute cor pulmonale: Secondary | ICD-10-CM | POA: Diagnosis present

## 2022-09-17 DIAGNOSIS — N1832 Chronic kidney disease, stage 3b: Secondary | ICD-10-CM | POA: Diagnosis present

## 2022-09-17 DIAGNOSIS — Z79899 Other long term (current) drug therapy: Secondary | ICD-10-CM

## 2022-09-17 DIAGNOSIS — Z885 Allergy status to narcotic agent status: Secondary | ICD-10-CM

## 2022-09-17 DIAGNOSIS — Z881 Allergy status to other antibiotic agents status: Secondary | ICD-10-CM

## 2022-09-17 DIAGNOSIS — D539 Nutritional anemia, unspecified: Secondary | ICD-10-CM | POA: Diagnosis present

## 2022-09-17 DIAGNOSIS — M545 Low back pain, unspecified: Secondary | ICD-10-CM | POA: Diagnosis present

## 2022-09-17 DIAGNOSIS — R296 Repeated falls: Secondary | ICD-10-CM | POA: Diagnosis present

## 2022-09-17 DIAGNOSIS — S022XXA Fracture of nasal bones, initial encounter for closed fracture: Secondary | ICD-10-CM

## 2022-09-17 DIAGNOSIS — D849 Immunodeficiency, unspecified: Secondary | ICD-10-CM | POA: Diagnosis present

## 2022-09-17 DIAGNOSIS — E119 Type 2 diabetes mellitus without complications: Secondary | ICD-10-CM

## 2022-09-17 DIAGNOSIS — E1122 Type 2 diabetes mellitus with diabetic chronic kidney disease: Secondary | ICD-10-CM | POA: Diagnosis present

## 2022-09-17 DIAGNOSIS — R262 Difficulty in walking, not elsewhere classified: Secondary | ICD-10-CM

## 2022-09-17 DIAGNOSIS — Z7901 Long term (current) use of anticoagulants: Secondary | ICD-10-CM

## 2022-09-17 DIAGNOSIS — Z818 Family history of other mental and behavioral disorders: Secondary | ICD-10-CM

## 2022-09-17 DIAGNOSIS — E1165 Type 2 diabetes mellitus with hyperglycemia: Secondary | ICD-10-CM | POA: Diagnosis present

## 2022-09-17 DIAGNOSIS — G8929 Other chronic pain: Secondary | ICD-10-CM | POA: Diagnosis present

## 2022-09-17 DIAGNOSIS — W19XXXA Unspecified fall, initial encounter: Secondary | ICD-10-CM | POA: Diagnosis present

## 2022-09-17 DIAGNOSIS — Z66 Do not resuscitate: Secondary | ICD-10-CM | POA: Diagnosis present

## 2022-09-17 DIAGNOSIS — I129 Hypertensive chronic kidney disease with stage 1 through stage 4 chronic kidney disease, or unspecified chronic kidney disease: Secondary | ICD-10-CM | POA: Diagnosis present

## 2022-09-17 DIAGNOSIS — D696 Thrombocytopenia, unspecified: Secondary | ICD-10-CM

## 2022-09-17 DIAGNOSIS — E785 Hyperlipidemia, unspecified: Secondary | ICD-10-CM | POA: Diagnosis present

## 2022-09-17 LAB — URINALYSIS, ROUTINE W REFLEX MICROSCOPIC
Bilirubin Urine: NEGATIVE
Glucose, UA: 50 mg/dL — AB
Hgb urine dipstick: NEGATIVE
Ketones, ur: NEGATIVE mg/dL
Nitrite: NEGATIVE
Protein, ur: 30 mg/dL — AB
Renal Epithelial: 13
Specific Gravity, Urine: 1.021 (ref 1.005–1.030)
WBC, UA: >50 WBC/hpf (ref 0–5)
pH: 5 (ref 5.0–8.0)

## 2022-09-17 LAB — FERRITIN: Ferritin: 1059 ng/mL — ABNORMAL HIGH (ref 11–307)

## 2022-09-17 LAB — CBC WITH DIFFERENTIAL/PLATELET
Abs Immature Granulocytes: 0.03 10*3/uL (ref 0.00–0.07)
Basophils Absolute: 0 10*3/uL (ref 0.0–0.1)
Basophils Relative: 0 %
Eosinophils Absolute: 0 10*3/uL (ref 0.0–0.5)
Eosinophils Relative: 0 %
HCT: 33 % — ABNORMAL LOW (ref 36.0–46.0)
Hemoglobin: 10.9 g/dL — ABNORMAL LOW (ref 12.0–15.0)
Immature Granulocytes: 0 %
Lymphocytes Relative: 4 %
Lymphs Abs: 0.3 10*3/uL — ABNORMAL LOW (ref 0.7–4.0)
MCH: 33.7 pg (ref 26.0–34.0)
MCHC: 33 g/dL (ref 30.0–36.0)
MCV: 102.2 fL — ABNORMAL HIGH (ref 80.0–100.0)
Monocytes Absolute: 0.5 10*3/uL (ref 0.1–1.0)
Monocytes Relative: 6 %
Neutro Abs: 7.2 10*3/uL (ref 1.7–7.7)
Neutrophils Relative %: 90 %
Platelets: 55 10*3/uL — ABNORMAL LOW (ref 150–400)
RBC: 3.23 MIL/uL — ABNORMAL LOW (ref 3.87–5.11)
RDW: 14.2 % (ref 11.5–15.5)
WBC: 8.1 10*3/uL (ref 4.0–10.5)
nRBC: 0.2 % (ref 0.0–0.2)

## 2022-09-17 LAB — IRON AND TIBC
Iron: 29 ug/dL (ref 28–170)
Saturation Ratios: 10 % — ABNORMAL LOW (ref 10.4–31.8)
TIBC: 290 ug/dL (ref 250–450)
UIBC: 261 ug/dL

## 2022-09-17 LAB — BASIC METABOLIC PANEL
Anion gap: 13 (ref 5–15)
BUN: 35 mg/dL — ABNORMAL HIGH (ref 8–23)
CO2: 24 mmol/L (ref 22–32)
Calcium: 9 mg/dL (ref 8.9–10.3)
Chloride: 94 mmol/L — ABNORMAL LOW (ref 98–111)
Creatinine, Ser: 2.2 mg/dL — ABNORMAL HIGH (ref 0.44–1.00)
GFR, Estimated: 23 mL/min — ABNORMAL LOW (ref >60)
Glucose, Bld: 308 mg/dL — ABNORMAL HIGH (ref 70–99)
Potassium: 4.4 mmol/L (ref 3.5–5.1)
Sodium: 131 mmol/L — ABNORMAL LOW (ref 135–145)

## 2022-09-17 LAB — MAGNESIUM: Magnesium: 1.9 mg/dL (ref 1.7–2.4)

## 2022-09-17 LAB — TROPONIN I (HIGH SENSITIVITY)
Troponin I (High Sensitivity): 9 ng/L (ref <18)
Troponin I (High Sensitivity): 10 ng/L (ref <18)

## 2022-09-17 LAB — VITAMIN B12: Vitamin B-12: 467 pg/mL (ref 180–914)

## 2022-09-17 LAB — CBG MONITORING, ED: Glucose-Capillary: 235 mg/dL — ABNORMAL HIGH (ref 70–99)

## 2022-09-17 LAB — SODIUM, URINE, RANDOM: Sodium, Ur: 60 mmol/L

## 2022-09-17 LAB — PROTIME-INR
INR: 1.1 (ref 0.8–1.2)
Prothrombin Time: 14 seconds (ref 11.4–15.2)

## 2022-09-17 LAB — TSH: TSH: 0.956 u[IU]/mL (ref 0.350–4.500)

## 2022-09-17 LAB — CK: Total CK: 29 U/L — ABNORMAL LOW (ref 38–234)

## 2022-09-17 LAB — CREATININE, URINE, RANDOM: Creatinine, Urine: 215.4 mg/dL

## 2022-09-17 LAB — GLUCOSE, CAPILLARY: Glucose-Capillary: 208 mg/dL — ABNORMAL HIGH (ref 70–99)

## 2022-09-17 MED ORDER — ROSUVASTATIN CALCIUM 10 MG PO TABS
10.0000 mg | ORAL_TABLET | Freq: Every day | ORAL | Status: DC
  Administered 2022-09-18 – 2022-09-21 (×4): 10 mg via ORAL
  Filled 2022-09-17 (×4): qty 1

## 2022-09-17 MED ORDER — CALCIUM CARB-CHOLECALCIFEROL 1000-20 MG-MCG PO TABS: ORAL_TABLET | Freq: Every day | ORAL | Status: DC

## 2022-09-17 MED ORDER — APIXABAN 2.5 MG PO TABS
2.5000 mg | ORAL_TABLET | Freq: Two times a day (BID) | ORAL | Status: DC
  Administered 2022-09-17 – 2022-09-21 (×8): 2.5 mg via ORAL
  Filled 2022-09-17 (×8): qty 1

## 2022-09-17 MED ORDER — HYDROCODONE-ACETAMINOPHEN 10-325 MG PO TABS
1.0000 | ORAL_TABLET | Freq: Four times a day (QID) | ORAL | Status: DC | PRN
  Administered 2022-09-17 – 2022-09-21 (×9): 1 via ORAL
  Filled 2022-09-17 (×9): qty 1

## 2022-09-17 MED ORDER — MAGNESIUM OXIDE -MG SUPPLEMENT 400 (240 MG) MG PO TABS
400.0000 mg | ORAL_TABLET | Freq: Two times a day (BID) | ORAL | Status: DC
  Administered 2022-09-17 – 2022-09-21 (×8): 400 mg via ORAL
  Filled 2022-09-17 (×8): qty 1

## 2022-09-17 MED ORDER — ACETAMINOPHEN 325 MG PO TABS
650.0000 mg | ORAL_TABLET | Freq: Four times a day (QID) | ORAL | Status: DC | PRN
  Administered 2022-09-18: 650 mg via ORAL
  Filled 2022-09-17 (×3): qty 2

## 2022-09-17 MED ORDER — PANTOPRAZOLE SODIUM 40 MG PO TBEC
40.0000 mg | DELAYED_RELEASE_TABLET | Freq: Every day | ORAL | Status: DC
  Administered 2022-09-18 – 2022-09-21 (×4): 40 mg via ORAL
  Filled 2022-09-17 (×4): qty 1

## 2022-09-17 MED ORDER — DM-GUAIFENESIN ER 30-600 MG PO TB12
1.0000 | ORAL_TABLET | Freq: Two times a day (BID) | ORAL | Status: DC
  Administered 2022-09-17: 1 via ORAL
  Filled 2022-09-17: qty 1

## 2022-09-17 MED ORDER — HYDROCODONE-ACETAMINOPHEN 5-325 MG PO TABS
1.0000 | ORAL_TABLET | Freq: Once | ORAL | Status: AC
  Administered 2022-09-17: 1 via ORAL
  Filled 2022-09-17: qty 1

## 2022-09-17 MED ORDER — ZOLPIDEM TARTRATE 5 MG PO TABS
5.0000 mg | ORAL_TABLET | Freq: Every evening | ORAL | Status: DC | PRN
  Administered 2022-09-17 – 2022-09-20 (×4): 5 mg via ORAL
  Filled 2022-09-17 (×4): qty 1

## 2022-09-17 MED ORDER — SODIUM CHLORIDE 0.9 % IV BOLUS
1000.0000 mL | Freq: Once | INTRAVENOUS | Status: AC
  Administered 2022-09-17: 1000 mL via INTRAVENOUS

## 2022-09-17 MED ORDER — ACYCLOVIR 800 MG PO TABS
400.0000 mg | ORAL_TABLET | Freq: Two times a day (BID) | ORAL | Status: DC
  Administered 2022-09-17 – 2022-09-21 (×8): 400 mg via ORAL
  Filled 2022-09-17 (×8): qty 1

## 2022-09-17 MED ORDER — ACETAMINOPHEN 650 MG RE SUPP: 650.0000 mg | Freq: Four times a day (QID) | RECTAL | Status: DC | PRN

## 2022-09-17 MED ORDER — INSULIN ASPART 100 UNIT/ML IJ SOLN
0.0000 [IU] | Freq: Three times a day (TID) | INTRAMUSCULAR | Status: DC
  Administered 2022-09-17: 3 [IU] via SUBCUTANEOUS
  Administered 2022-09-18 – 2022-09-19 (×4): 2 [IU] via SUBCUTANEOUS
  Administered 2022-09-19 – 2022-09-20 (×2): 1 [IU] via SUBCUTANEOUS
  Administered 2022-09-20: 2 [IU] via SUBCUTANEOUS
  Administered 2022-09-21 (×2): 1 [IU] via SUBCUTANEOUS
  Filled 2022-09-17: qty 1

## 2022-09-17 MED ORDER — ESCITALOPRAM OXALATE 10 MG PO TABS
20.0000 mg | ORAL_TABLET | Freq: Every day | ORAL | Status: DC
  Administered 2022-09-18 – 2022-09-21 (×4): 20 mg via ORAL
  Filled 2022-09-17 (×4): qty 2

## 2022-09-17 MED ORDER — SODIUM CHLORIDE 0.9% FLUSH
3.0000 mL | Freq: Two times a day (BID) | INTRAVENOUS | Status: DC
  Administered 2022-09-17 – 2022-09-21 (×8): 3 mL via INTRAVENOUS

## 2022-09-17 MED ORDER — ALPRAZOLAM 0.5 MG PO TABS
0.5000 mg | ORAL_TABLET | Freq: Two times a day (BID) | ORAL | Status: DC | PRN
  Administered 2022-09-19 – 2022-09-21 (×4): 0.5 mg via ORAL
  Filled 2022-09-17 (×4): qty 1

## 2022-09-17 MED ORDER — SODIUM CHLORIDE 0.9 % IV SOLN: INTRAVENOUS | Status: DC

## 2022-09-17 NOTE — ED Provider Notes (Signed)
[place] Provider Note   CSN: [id number] Arrival date & time: 09/17/22  1022     History  Chief Complaint  Patient presents with   Lytle Michaels    Alexandra Price is a 77 y.o. female with a past medical history of multiple myeloma currently on chemotherapy presenting today due to a fall.  She reports that she has had multiple falls over the past 5 days secondary to "becoming very dizzy."  She is unsure what makes her dizzy but often says that it is when she stands up from the seated position and starts to walk.  Denies any recent GI illness, seizure or syncope history.  Does report that she has been told that she has some type of arrhythmia.  Reports falling today after becoming dizzy and striking her face onto her walker.  Complaining most about pain to her nose.  Says that yesterday she fell onto her right hip which is still causing her pain as well.  She also reports that her appetite has been decreased with the chemotherapy.   Fall       Home Medications Prior to Admission medications   Medication Sig Start Date End Date Taking? Authorizing Provider  acyclovir (ZOVIRAX) 400 MG tablet Take 1 tablet (400 mg total) by mouth 2 (two) times daily. 06/22/22  Yes Pennington, Rebekah M, [name]  albuterol (PROVENTIL) (2.5 MG/3ML) 0.083% nebulizer solution Take 3 mLs (2.5 mg total) by nebulization every 4 (four) hours as needed for wheezing or shortness of breath. 08/17/22 08/17/23 Yes Emokpae, Courage, MD  albuterol (VENTOLIN HFA) 108 (90 Base) MCG/ACT inhaler Inhale 2 puffs into the lungs every 4 (four) hours as needed for wheezing or shortness of breath. 08/17/22  Yes Emokpae, Courage, MD  ALPRAZolam (XANAX) 0.5 MG tablet TAKE 1 TABLET BY MOUTH DURING THE DAY AS NEEDED FOR ANXIETY AND 2 TABLETS AT BEDTIME AS NEEDED FOR ANXIETY/INSOMNIA 08/23/22  Yes [name], MD  Calcium Carb-Cholecalciferol (CALCIUM 1000 + D PO) Take 1,000 mg by mouth daily.    Yes [provider]  dexAMETHasone 20 MG TABS Take 1 tablet by mouth as directed. Take 1 tablet 30 minutes prior to infusions 05/30/22  Yes [name], MD  dextromethorphan-guaiFENesin [facility] DM) 30-600 MG 12hr tablet Take 1 tablet by mouth 2 (two) times daily. 08/17/22  Yes Emokpae, Courage, MD  diphenhydrAMINE (BENADRYL) 25 MG tablet Take 50 mg by mouth as needed (prior to infusion).   Yes [provider]  ELIQUIS 2.5 MG TABS tablet Take 2.5 mg by mouth 2 (two) times daily. 06/14/22  Yes [provider]  escitalopram (LEXAPRO) 20 MG tablet Take 20 mg by mouth daily. 12/18/21  Yes [provider]  HYDROcodone-acetaminophen (NORCO) 10-325 MG tablet Take 1 tablet by mouth every 6 (six) hours as needed. 09/10/22  Yes [name], MD  magnesium oxide (MAG-OX) 400 (240 Mg) MG tablet Take 1 tablet (400 mg total) by mouth 2 (two) times daily. 09/10/22  Yes [name], MD  methocarbamol (ROBAXIN) 500 MG tablet Take 1 tablet (500 mg total) by mouth every 8 (eight) hours as needed for muscle spasms (back pain). 08/02/22  Yes Mesner, Corene Cornea, MD  montelukast (SINGULAIR) 10 MG tablet TAKE 1 TABLET BY MOUTH AS DIRECTED 30 MINUTES PRIOR TO TREATMENT Patient taking differently: Take 10 mg by mouth See admin instructions. Take 1 tablet by mouth as directed 30 minutes prior to treatment 06/21/22  Yes [name], MD  Multiple Vitamin (  MULTI VITAMIN) TABS Take 1 tablet by mouth daily.   Yes [provider]  pantoprazole (PROTONIX) 40 MG tablet Take 1 tablet (40 mg total) by mouth daily. 02/12/22  Yes [name], MD  polyethylene glycol (MIRALAX / GLYCOLAX) 17 g packet Take 17 g by mouth daily as needed for moderate constipation. 08/04/22  Yes Pollina, Gwenyth Allegra, MD  prochlorperazine (COMPAZINE) 10 MG tablet Take 1 tablet (10 mg total) by mouth every 6 (six) hours as needed for nausea or vomiting. 06/21/22  Yes [name], MD  rosuvastatin (CRESTOR) 10 MG tablet Take 10 mg by mouth daily. 05/30/20  Yes [provider]  zolpidem (AMBIEN) 5 MG tablet Take 2 tablets (10 mg total) by mouth at bedtime as needed for sleep. 09/04/22  Yes Tat, Shanon Brow, MD  acetaminophen (TYLENOL) 325 MG tablet Take 650 mg by mouth as needed for moderate pain (prior to infusion).    [provider]  fluconazole (DIFLUCAN) 100 MG tablet Take 1 tablet (100 mg total) by mouth daily. Take 2 tablets by mouth on day one and then one tablet daily until complete Patient not taking: Reported on 09/17/2022 08/27/22   [name], MD  Nebulizers (COMPRESSOR/NEBULIZER) MISC 1 Units by Does not apply route 3 (three) times daily as needed. 08/17/22   Roxan Hockey, MD      Allergies    Ciprofloxacin, Amoxicillin, Morphine and related, and Augmentin [amoxicillin-pot clavulanate]    Review of Systems   Review of Systems  Physical Exam Updated Vital Signs BP 130/81   Pulse 93   Temp 98.2 F (36.8 C) (Oral)   Resp 13   Ht '5\' 3"'$  (1.6 m)   Wt 75.8 kg   SpO2 96%   BMI 29.58 kg/m  Physical Exam Vitals and nursing note reviewed.  Constitutional:      General: She is not in acute distress.    Appearance: Normal appearance. She is not ill-appearing.  HENT:     Head: Normocephalic and atraumatic.     Mouth/Throat:     Mouth: Mucous membranes are moist.     Pharynx: Oropharynx is clear.  Eyes:     General: No scleral icterus.    Extraocular Movements: Extraocular movements intact.     Conjunctiva/sclera: Conjunctivae normal.     Pupils: Pupils are equal, round, and reactive to light.  Cardiovascular:     Rate and Rhythm: Normal rate and regular rhythm.  Pulmonary:     Effort: Pulmonary effort is normal. No respiratory distress.  Abdominal:     General: Abdomen is flat.     Palpations: Abdomen is soft.     Tenderness: There is abdominal tenderness (Mild left upper and right upper quadrant tenderness).   Musculoskeletal:        General: No swelling, tenderness, deformity or signs of injury.     Comments: Full range of motion of all levels of the spine.  Right hip without tenderness to palpation.  Range of motion intact.  No step-offs or crepitus  Skin:    General: Skin is warm and dry.     Findings: No rash.  Neurological:     Mental Status: She is alert.  Psychiatric:        Mood and Affect: Mood normal.     ED Results / Procedures / Treatments   Labs (all labs ordered are listed, but only abnormal results are displayed) Labs Reviewed  CBC WITH DIFFERENTIAL/PLATELET  BASIC METABOLIC PANEL    EKG None  Radiology CT Head Wo Contrast  Result Date: 09/17/2022 CLINICAL DATA:  Head trauma, intracranial injury suspected. Patient became dizzy and fell face first on the floor. EXAM: CT HEAD WITHOUT CONTRAST CT MAXILLOFACIAL WITHOUT CONTRAST CT CERVICAL SPINE WITHOUT CONTRAST TECHNIQUE: Multidetector CT imaging of the head, cervical spine, and maxillofacial structures were performed using the standard protocol without intravenous contrast. Multiplanar CT image reconstructions of the cervical spine and maxillofacial structures were also generated. RADIATION DOSE REDUCTION: This exam was performed according to the departmental dose-optimization program which includes automated exposure control, adjustment of the mA and/or kV according to patient size and/or use of iterative reconstruction technique. COMPARISON:  None Available. FINDINGS: CT HEAD FINDINGS Brain: No evidence of acute infarction, hemorrhage, hydrocephalus, extra-axial collection or mass lesion/mass effect. Patchy areas of low-attenuation suggesting moderate chronic microvascular ischemic changes. Vascular: No hyperdense vessel or unexpected calcification. Skull: Multiple calvarial lucencies consistent with history of myeloma. Negative for fracture or focal lesion. Other: None. CT MAXILLOFACIAL FINDINGS Osseous: Mild irregularity of  bilateral nasal bones with surrounding soft tissue swelling suggesting nondisplaced fracture. Orbits: Negative. No traumatic or inflammatory finding. Sinuses: Clear. Soft tissues: Soft tissue swelling about the nose. No drainable fluid collection or hematoma. CT CERVICAL SPINE FINDINGS Alignment: Normal. Skull base and vertebrae: Multiple lucencies of the cervical vertebral bodies without evidence of acute fracture. Soft tissues and spinal canal: No prevertebral fluid or swelling. No visible canal hematoma. Disc levels: Multilevel degenerate disc disease with disc height loss. Multilevel facet joint arthropathy. Upper chest: Biapical pleural/parenchymal scarring. Other: None IMPRESSION: CT HEAD: 1. No acute intracranial abnormality. 2. Multiple calvarial lucencies consistent with history of myeloma. CT MAXILLOFACIAL: Mild irregularity of bilateral nasal bones with surrounding soft tissue swelling suggesting nondisplaced fracture. CT CERVICAL SPINE: 1. No acute fracture or traumatic subluxation. 2. Multilevel degenerate disc disease and facet joint arthropathy. 3. Multiple lucencies of the cervical vertebral bodies consistent with history of myeloma. Electronically Signed   By: Keane Police D.O.   On: 09/17/2022 13:26   CT Cervical Spine Wo Contrast  Result Date: 09/17/2022 CLINICAL DATA:  Head trauma, intracranial injury suspected. Patient became dizzy and fell face first on the floor. EXAM: CT HEAD WITHOUT CONTRAST CT MAXILLOFACIAL WITHOUT CONTRAST CT CERVICAL SPINE WITHOUT CONTRAST TECHNIQUE: Multidetector CT imaging of the head, cervical spine, and maxillofacial structures were performed using the standard protocol without intravenous contrast. Multiplanar CT image reconstructions of the cervical spine and maxillofacial structures were also generated. RADIATION DOSE REDUCTION: This exam was performed according to the departmental dose-optimization program which includes automated exposure control, adjustment  of the mA and/or kV according to patient size and/or use of iterative reconstruction technique. COMPARISON:  None Available. FINDINGS: CT HEAD FINDINGS Brain: No evidence of acute infarction, hemorrhage, hydrocephalus, extra-axial collection or mass lesion/mass effect. Patchy areas of low-attenuation suggesting moderate chronic microvascular ischemic changes. Vascular: No hyperdense vessel or unexpected calcification. Skull: Multiple calvarial lucencies consistent with history of myeloma. Negative for fracture or focal lesion. Other: None. CT MAXILLOFACIAL FINDINGS Osseous: Mild irregularity of bilateral nasal bones with surrounding soft tissue swelling suggesting nondisplaced fracture. Orbits: Negative. No traumatic or inflammatory finding. Sinuses: Clear. Soft tissues: Soft tissue swelling about the nose. No drainable fluid collection or hematoma. CT CERVICAL SPINE FINDINGS Alignment: Normal. Skull base and vertebrae: Multiple lucencies of the cervical vertebral bodies without evidence of acute fracture. Soft tissues and spinal canal: No prevertebral fluid or swelling. No visible canal hematoma. Disc levels: Multilevel degenerate disc disease with disc height loss.  Multilevel facet joint arthropathy. Upper chest: Biapical pleural/parenchymal scarring. Other: None IMPRESSION: CT HEAD: 1. No acute intracranial abnormality. 2. Multiple calvarial lucencies consistent with history of myeloma. CT MAXILLOFACIAL: Mild irregularity of bilateral nasal bones with surrounding soft tissue swelling suggesting nondisplaced fracture. CT CERVICAL SPINE: 1. No acute fracture or traumatic subluxation. 2. Multilevel degenerate disc disease and facet joint arthropathy. 3. Multiple lucencies of the cervical vertebral bodies consistent with history of myeloma. Electronically Signed   By: Keane Police D.O.   On: 09/17/2022 13:26   CT Maxillofacial Wo Contrast  Result Date: 09/17/2022 CLINICAL DATA:  Head trauma, intracranial injury  suspected. Patient became dizzy and fell face first on the floor. EXAM: CT HEAD WITHOUT CONTRAST CT MAXILLOFACIAL WITHOUT CONTRAST CT CERVICAL SPINE WITHOUT CONTRAST TECHNIQUE: Multidetector CT imaging of the head, cervical spine, and maxillofacial structures were performed using the standard protocol without intravenous contrast. Multiplanar CT image reconstructions of the cervical spine and maxillofacial structures were also generated. RADIATION DOSE REDUCTION: This exam was performed according to the departmental dose-optimization program which includes automated exposure control, adjustment of the mA and/or kV according to patient size and/or use of iterative reconstruction technique. COMPARISON:  None Available. FINDINGS: CT HEAD FINDINGS Brain: No evidence of acute infarction, hemorrhage, hydrocephalus, extra-axial collection or mass lesion/mass effect. Patchy areas of low-attenuation suggesting moderate chronic microvascular ischemic changes. Vascular: No hyperdense vessel or unexpected calcification. Skull: Multiple calvarial lucencies consistent with history of myeloma. Negative for fracture or focal lesion. Other: None. CT MAXILLOFACIAL FINDINGS Osseous: Mild irregularity of bilateral nasal bones with surrounding soft tissue swelling suggesting nondisplaced fracture. Orbits: Negative. No traumatic or inflammatory finding. Sinuses: Clear. Soft tissues: Soft tissue swelling about the nose. No drainable fluid collection or hematoma. CT CERVICAL SPINE FINDINGS Alignment: Normal. Skull base and vertebrae: Multiple lucencies of the cervical vertebral bodies without evidence of acute fracture. Soft tissues and spinal canal: No prevertebral fluid or swelling. No visible canal hematoma. Disc levels: Multilevel degenerate disc disease with disc height loss. Multilevel facet joint arthropathy. Upper chest: Biapical pleural/parenchymal scarring. Other: None IMPRESSION: CT HEAD: 1. No acute intracranial abnormality. 2.  Multiple calvarial lucencies consistent with history of myeloma. CT MAXILLOFACIAL: Mild irregularity of bilateral nasal bones with surrounding soft tissue swelling suggesting nondisplaced fracture. CT CERVICAL SPINE: 1. No acute fracture or traumatic subluxation. 2. Multilevel degenerate disc disease and facet joint arthropathy. 3. Multiple lucencies of the cervical vertebral bodies consistent with history of myeloma. Electronically Signed   By: Keane Police D.O.   On: 09/17/2022 13:26    Procedures Procedures   Medications Ordered in ED Medications - No data to display  ED Course/ Medical Decision Making/ A&P Clinical Course as of 09/17/22 1604  Mon Sep 17, 2022  1339 ED EKG [AH]  1407 ED EKG [AH]    Clinical Course User Index [AH] Rica Koyanagi                             Medical Decision Making Amount and/or Complexity of Data Reviewed Labs: ordered. Radiology: ordered.  Risk Prescription drug management. Decision regarding hospitalization.   77 year old presenting with concern for fall. Differential for the fall includes but is not limited to arrhythmia, seizure, syncope, CVA, hypoglycemia, orthostasis, vasovagal, intoxication.    This is not an exhaustive differential.    Past Medical History / Co-morbidities / Social History: MM under chemotherapy management,    Physical Exam: Pertinent physical exam  findings include Well-appearing, abrasion to the bridge of the nose. Normal head to toe neuroexam, RRR with a normal rate  Lab Tests: I ordered, and personally interpreted labs.  The pertinent results include: Sodium 131, chloride 94 And 2.2, up from baseline of 1.9 Thrombocytopenia 55, patient's baseline appears to be in the 60s Macrocytic anemia   Imaging Studies: CT scans ordered by nursing staff.  CT head, cervical spine and maxillofacial reviewed and interpreted by me.  Agree with radiology that only abnormality is patient's nasal fracture.    Cardiac Monitoring:  The patient was maintained on a cardiac monitor.  I viewed and interpreted the cardiac monitored which showed an underlying rhythm of: NSR   Medications: Vicodin for pain from fall.  This is what she takes at home    Consultations: I spoke with Dr. Delton Coombes who is the patient's oncologist.  He requests an MRI of her thoracic and lumbar spine to evaluate for metastases for her myeloma.  These have been ordered at this time.  MDM/Disposition: This is a 78 year old female who presented today after a fall.  Reports that she has been having multiple falls over the past 5 days at home.  She reports dizzy spells prior to her fall but no LOC.  Is on Eliquis.  Workup relatively unremarkable.  Does have an abrasion to the bridge of her nose however no septal deviation.  Clear airway.  Neuroexam is within normal limits and there are no arrhythmias noted on the monitor or auscultated.  She does have an AKI in the setting of multiple falls at home currently on chemotherapy I believe that she is a good candidate for admission to the hospital.  Dr. Rogers Blocker will accept the patient     I discussed this case with my attending physician Dr. Roderic Palau who cosigned this note including patient's presenting symptoms, physical exam, and planned diagnostics and interventions. Attending physician stated agreement with plan or made changes to plan which were implemented.     Final Clinical Impression(s) / ED Diagnoses Final diagnoses:  AKI (acute kidney injury) [facility])    Rx / DC Orders ED Discharge Orders     None      Admit   Rhae Hammock, [name] 09/17/22 1621    Milton Ferguson, MD 09/18/22 1023

## 2022-09-17 NOTE — H&P (Addendum)
History and Physical    Patient: Alexandra Price QQP:[id number] DOB: 1946-04-24 DOA: 09/17/2022 DOS: the patient was seen and examined on 09/17/2022 PCP: [name], MD  Patient coming from: Home - lives with her son. Uses walker and cane.    Chief Complaint: multiple falls   HPI: Alexandra Price is a 77 y.o. female with medical history significant of CKD, MM, HTN, depression, CKD stage 3b, hx of PE on eliquis, T2DM who presented to ED after falling 5 times over the past 5 days. She will have dizziness before each episode.  She can not for sure say if she has lost consciousness, because she can't remember anything christmas day an feels like her memory is poor.  She states she will be dizzy after being up for a little bit right before she falls. She has dizziness with postural change too.  She states she has been eating and drinking fine.  She is weaker and feels like she has been on a decline for the past few weeks. She has tingling in the backs of her legs to the knee, but no loss of sensation. No urinary incontinence, no saddle paraesthesias. She also complains of urine color change, odor and dysuria.   Denies any fever/chills, vision changes, +headaches, chest pain or palpitations, +SOB or cough, abdominal pain, + N, but no vomiting or diarrhea, no leg swelling.    She does not smoke or drink.   ER Course:  vitals: afebrile, bp: 97/75, HR: 104, RR: 16, oxygen: 97%RA Pertinent labs: hgb: 10.9, MCV: 102, platelets 55, BUN: 35, creatinine: 2.20, glucose: 308,  CT head: no acute finding CT maxillofacial: mild irregularity of the bilateral nasal bones with surrounding soft tissue swelling suggesting nondisplaced fracture.  Ct cervical spine: no acute fracture. DDD, multiple lucencies of the cervical vertebral bodies consistent with hx of MM.  In ED: oncology consulted. Given 1L IVF bolus and TRH asked to admit.     Review of Systems: As mentioned in the history of present illness. All  other systems reviewed and are negative. Past Medical History:  Diagnosis Date   Anxiety    Chronic kidney disease    Depression    Hypertension    Multiple myeloma ([place])    multiple myeloma   Pre-diabetes    Past Surgical History:  Procedure Laterality Date   ABDOMINAL HYSTERECTOMY     total   APPENDECTOMY     BALLOON DILATION N/A 05/28/2022   Procedure: BALLOON DILATION;  Surgeon: [name], DO;  [facility];  Service: Endoscopy;  Laterality: N/A;   BIOPSY  05/28/2022   Procedure: BIOPSY;  Surgeon: [name], DO;  [facility];  Service: Endoscopy;;   COLONOSCOPY WITH PROPOFOL N/A 12/25/2021   Procedure: COLONOSCOPY WITH PROPOFOL;  Surgeon: [name], DO;  [facility]: [facility];  Service: Endoscopy;  Laterality: N/A;  2:30pm   ESOPHAGOGASTRODUODENOSCOPY (EGD) WITH PROPOFOL N/A 05/28/2022   Procedure: ESOPHAGOGASTRODUODENOSCOPY (EGD) WITH PROPOFOL;  Surgeon: [name], DO;  [facility];  Service: Endoscopy;  Laterality: N/A;  10:00am, asa 3   LAPAROSCOPIC APPENDECTOMY N/A 05/20/2018   Procedure: APPENDECTOMY LAPAROSCOPIC;  Surgeon: [name], MD;  Location: [facility];  Service: General;  Laterality: N/A;   POLYPECTOMY  12/25/2021   Procedure: POLYPECTOMY;  Surgeon: [name], DO;  [facility];  Service: Endoscopy;;   PORTACATH PLACEMENT Right 04/14/2018   Procedure: INSERTION PORT-A-CATH;  Surgeon: [name], MD;  Location: [facility];  Service: General;  Laterality: Right;   Social History:  reports that she has never smoked. She has never used smokeless tobacco. She reports that she does not currently use drugs. She reports that she does not drink alcohol.  Allergies  Allergen Reactions   Ciprofloxacin Anaphylaxis   Amoxicillin Other (See Comments)    unknown   Morphine And Related Nausea And Vomiting   Augmentin [Amoxicillin-Pot Clavulanate] Other (See Comments)    Headache, insomnia     Family History  Problem Relation Age of Onset   Heart disease Mother    Emphysema Father    Diabetes Sister    Depression Sister    Cancer Brother        liver, lung, and colon. colon cancer at age 32.   Diabetes Brother     Prior to Admission medications   Medication Sig Start Date End Date Taking? Authorizing Provider  acyclovir (ZOVIRAX) 400 MG tablet Take 1 tablet (400 mg total) by mouth 2 (two) times daily. 06/22/22  Yes Pennington, Rebekah M, [name]  albuterol (PROVENTIL) (2.5 MG/3ML) 0.083% nebulizer solution Take 3 mLs (2.5 mg total) by nebulization every 4 (four) hours as needed for wheezing or shortness of breath. 08/17/22 08/17/23 Yes Emokpae, Courage, MD  albuterol (VENTOLIN HFA) 108 (90 Base) MCG/ACT inhaler Inhale 2 puffs into the lungs every 4 (four) hours as needed for wheezing or shortness of breath. 08/17/22  Yes Emokpae, Courage, MD  ALPRAZolam (XANAX) 0.5 MG tablet TAKE 1 TABLET BY MOUTH DURING THE DAY AS NEEDED FOR ANXIETY AND 2 TABLETS AT BEDTIME AS NEEDED FOR ANXIETY/INSOMNIA 08/23/22  Yes [name], MD  Calcium Carb-Cholecalciferol (CALCIUM 1000 + D PO) Take 1,000 mg by mouth daily.   Yes [provider]  dexAMETHasone 20 MG TABS Take 1 tablet by mouth as directed. Take 1 tablet 30 minutes prior to infusions 05/30/22  Yes [name], MD  dextromethorphan-guaiFENesin [facility] DM) 30-600 MG 12hr tablet Take 1 tablet by mouth 2 (two) times daily. 08/17/22  Yes Emokpae, Courage, MD  diphenhydrAMINE (BENADRYL) 25 MG tablet Take 50 mg by mouth as needed (prior to infusion).   Yes [provider]  ELIQUIS 2.5 MG TABS tablet Take 2.5 mg by mouth 2 (two) times daily. 06/14/22  Yes [provider]  escitalopram (LEXAPRO) 20 MG tablet Take 20 mg by mouth daily. 12/18/21  Yes [provider]  HYDROcodone-acetaminophen (NORCO) 10-325 MG tablet Take 1 tablet by mouth every 6 (six) hours as needed. 09/10/22  Yes [name], MD  magnesium oxide (MAG-OX) 400 (240 Mg) MG tablet Take 1 tablet (400 mg total) by mouth 2 (two) times daily. 09/10/22  Yes [name], MD  methocarbamol (ROBAXIN) 500 MG tablet Take 1 tablet (500 mg total) by mouth every 8 (eight) hours as needed for muscle spasms (back pain). 08/02/22  Yes Mesner, Corene Cornea, MD  montelukast (SINGULAIR) 10 MG tablet TAKE 1 TABLET BY MOUTH AS DIRECTED 30 MINUTES PRIOR TO TREATMENT Patient taking differently: Take 10 mg by mouth See admin instructions. Take 1 tablet by mouth as directed 30 minutes prior to treatment 06/21/22  Yes [name], MD  Multiple Vitamin (MULTI VITAMIN) TABS Take 1 tablet by mouth daily.   Yes [provider]  pantoprazole (PROTONIX) 40 MG tablet Take 1 tablet (40 mg total) by mouth daily. 02/12/22  Yes [name], MD  polyethylene glycol (MIRALAX / GLYCOLAX) 17 g packet Take 17 g by mouth daily as needed for  moderate constipation. 08/04/22  Yes Pollina, Gwenyth Allegra, MD  prochlorperazine (COMPAZINE) 10 MG tablet Take 1 tablet (10 mg total) by mouth every 6 (six) hours as needed for nausea or vomiting. 06/21/22  Yes [name], MD  rosuvastatin (CRESTOR) 10 MG tablet Take 10 mg by mouth daily. 05/30/20  Yes [provider]  zolpidem (AMBIEN) 5 MG tablet Take 2 tablets (10 mg total) by mouth at bedtime as needed for sleep. 09/04/22  Yes Tat, Shanon Brow, MD  acetaminophen (TYLENOL) 325 MG tablet Take 650 mg by mouth as needed for moderate pain (prior to infusion).    [provider]  fluconazole (DIFLUCAN) 100 MG tablet Take 1 tablet (100 mg total) by mouth daily. Take 2 tablets by mouth on day one and then one tablet daily until complete Patient not taking: Reported on 09/17/2022 08/27/22   [name], MD  Nebulizers (COMPRESSOR/NEBULIZER) MISC 1 Units by Does not apply route 3 (three) times daily as needed. 08/17/22   Roxan Hockey, MD    Physical Exam: Vitals:    09/17/22 1530 09/17/22 1600 09/17/22 1620 09/17/22 1738  BP: 126/64 130/77  107/68  Pulse: 82 79  87  Resp: '11 12  18  '$ Temp:   99 F (37.2 C) 98.1 F (36.7 C)  TempSrc:   Oral Oral  SpO2: 97% 98%  96%  Weight:      Height:    '5\' 3"'$  (1.6 m)   General:  Appears calm and comfortable and is in NAD Eyes:  PERRL, EOMI, normal lids, iris ENT:  grossly normal hearing, lips & tongue, mmm; appropriate dentition. Small laceration across nasal bridge Neck:  no LAD, masses or thyromegaly; no carotid bruits Cardiovascular:  RRR, no m/r/g. No LE edema.  Respiratory:   CTA bilaterally with no wheezes/rales/rhonchi.  Normal respiratory effort. Abdomen:  soft, NT, ND, NABS Back:   normal alignment, no CVAT Skin:  no rash or induration seen on limited exam Musculoskeletal:  grossly normal tone BUE/BLE, good ROM, no bony abnormality.  Lower extremity:  No LE edema.  Limited foot exam with no ulcerations.  2+ distal pulses. Psychiatric:  grossly normal mood and affect, speech fluent and appropriate, AOx3 Neurologic:  CN 2-12 grossly intact, moves all extremities in coordinated fashion, sensation intact. DTR 2+.    Radiological Exams on Admission: Independently reviewed - see discussion in A/P where applicable  CT Head Wo Contrast  Result Date: 09/17/2022 CLINICAL DATA:  Head trauma, intracranial injury suspected. Patient became dizzy and fell face first on the floor. EXAM: CT HEAD WITHOUT CONTRAST CT MAXILLOFACIAL WITHOUT CONTRAST CT CERVICAL SPINE WITHOUT CONTRAST TECHNIQUE: Multidetector CT imaging of the head, cervical spine, and maxillofacial structures were performed using the standard protocol without intravenous contrast. Multiplanar CT image reconstructions of the cervical spine and maxillofacial structures were also generated. RADIATION DOSE REDUCTION: This exam was performed according to the departmental dose-optimization program which includes automated exposure control, adjustment of the mA  and/or kV according to patient size and/or use of iterative reconstruction technique. COMPARISON:  None Available. FINDINGS: CT HEAD FINDINGS Brain: No evidence of acute infarction, hemorrhage, hydrocephalus, extra-axial collection or mass lesion/mass effect. Patchy areas of low-attenuation suggesting moderate chronic microvascular ischemic changes. Vascular: No hyperdense vessel or unexpected calcification. Skull: Multiple calvarial lucencies consistent with history of myeloma. Negative for fracture or focal lesion. Other: None. CT MAXILLOFACIAL FINDINGS Osseous: Mild irregularity of bilateral nasal bones with surrounding soft tissue swelling suggesting nondisplaced fracture. Orbits: Negative. No traumatic or inflammatory  finding. Sinuses: Clear. Soft tissues: Soft tissue swelling about the nose. No drainable fluid collection or hematoma. CT CERVICAL SPINE FINDINGS Alignment: Normal. Skull base and vertebrae: Multiple lucencies of the cervical vertebral bodies without evidence of acute fracture. Soft tissues and spinal canal: No prevertebral fluid or swelling. No visible canal hematoma. Disc levels: Multilevel degenerate disc disease with disc height loss. Multilevel facet joint arthropathy. Upper chest: Biapical pleural/parenchymal scarring. Other: None IMPRESSION: CT HEAD: 1. No acute intracranial abnormality. 2. Multiple calvarial lucencies consistent with history of myeloma. CT MAXILLOFACIAL: Mild irregularity of bilateral nasal bones with surrounding soft tissue swelling suggesting nondisplaced fracture. CT CERVICAL SPINE: 1. No acute fracture or traumatic subluxation. 2. Multilevel degenerate disc disease and facet joint arthropathy. 3. Multiple lucencies of the cervical vertebral bodies consistent with history of myeloma. Electronically Signed   By: Keane Police D.O.   On: 09/17/2022 13:26   CT Cervical Spine Wo Contrast  Result Date: 09/17/2022 CLINICAL DATA:  Head trauma, intracranial injury suspected.  Patient became dizzy and fell face first on the floor. EXAM: CT HEAD WITHOUT CONTRAST CT MAXILLOFACIAL WITHOUT CONTRAST CT CERVICAL SPINE WITHOUT CONTRAST TECHNIQUE: Multidetector CT imaging of the head, cervical spine, and maxillofacial structures were performed using the standard protocol without intravenous contrast. Multiplanar CT image reconstructions of the cervical spine and maxillofacial structures were also generated. RADIATION DOSE REDUCTION: This exam was performed according to the departmental dose-optimization program which includes automated exposure control, adjustment of the mA and/or kV according to patient size and/or use of iterative reconstruction technique. COMPARISON:  None Available. FINDINGS: CT HEAD FINDINGS Brain: No evidence of acute infarction, hemorrhage, hydrocephalus, extra-axial collection or mass lesion/mass effect. Patchy areas of low-attenuation suggesting moderate chronic microvascular ischemic changes. Vascular: No hyperdense vessel or unexpected calcification. Skull: Multiple calvarial lucencies consistent with history of myeloma. Negative for fracture or focal lesion. Other: None. CT MAXILLOFACIAL FINDINGS Osseous: Mild irregularity of bilateral nasal bones with surrounding soft tissue swelling suggesting nondisplaced fracture. Orbits: Negative. No traumatic or inflammatory finding. Sinuses: Clear. Soft tissues: Soft tissue swelling about the nose. No drainable fluid collection or hematoma. CT CERVICAL SPINE FINDINGS Alignment: Normal. Skull base and vertebrae: Multiple lucencies of the cervical vertebral bodies without evidence of acute fracture. Soft tissues and spinal canal: No prevertebral fluid or swelling. No visible canal hematoma. Disc levels: Multilevel degenerate disc disease with disc height loss. Multilevel facet joint arthropathy. Upper chest: Biapical pleural/parenchymal scarring. Other: None IMPRESSION: CT HEAD: 1. No acute intracranial abnormality. 2. Multiple  calvarial lucencies consistent with history of myeloma. CT MAXILLOFACIAL: Mild irregularity of bilateral nasal bones with surrounding soft tissue swelling suggesting nondisplaced fracture. CT CERVICAL SPINE: 1. No acute fracture or traumatic subluxation. 2. Multilevel degenerate disc disease and facet joint arthropathy. 3. Multiple lucencies of the cervical vertebral bodies consistent with history of myeloma. Electronically Signed   By: Keane Police D.O.   On: 09/17/2022 13:26   CT Maxillofacial Wo Contrast  Result Date: 09/17/2022 CLINICAL DATA:  Head trauma, intracranial injury suspected. Patient became dizzy and fell face first on the floor. EXAM: CT HEAD WITHOUT CONTRAST CT MAXILLOFACIAL WITHOUT CONTRAST CT CERVICAL SPINE WITHOUT CONTRAST TECHNIQUE: Multidetector CT imaging of the head, cervical spine, and maxillofacial structures were performed using the standard protocol without intravenous contrast. Multiplanar CT image reconstructions of the cervical spine and maxillofacial structures were also generated. RADIATION DOSE REDUCTION: This exam was performed according to the departmental dose-optimization program which includes automated exposure control, adjustment of the mA and/or  kV according to patient size and/or use of iterative reconstruction technique. COMPARISON:  None Available. FINDINGS: CT HEAD FINDINGS Brain: No evidence of acute infarction, hemorrhage, hydrocephalus, extra-axial collection or mass lesion/mass effect. Patchy areas of low-attenuation suggesting moderate chronic microvascular ischemic changes. Vascular: No hyperdense vessel or unexpected calcification. Skull: Multiple calvarial lucencies consistent with history of myeloma. Negative for fracture or focal lesion. Other: None. CT MAXILLOFACIAL FINDINGS Osseous: Mild irregularity of bilateral nasal bones with surrounding soft tissue swelling suggesting nondisplaced fracture. Orbits: Negative. No traumatic or inflammatory finding.  Sinuses: Clear. Soft tissues: Soft tissue swelling about the nose. No drainable fluid collection or hematoma. CT CERVICAL SPINE FINDINGS Alignment: Normal. Skull base and vertebrae: Multiple lucencies of the cervical vertebral bodies without evidence of acute fracture. Soft tissues and spinal canal: No prevertebral fluid or swelling. No visible canal hematoma. Disc levels: Multilevel degenerate disc disease with disc height loss. Multilevel facet joint arthropathy. Upper chest: Biapical pleural/parenchymal scarring. Other: None IMPRESSION: CT HEAD: 1. No acute intracranial abnormality. 2. Multiple calvarial lucencies consistent with history of myeloma. CT MAXILLOFACIAL: Mild irregularity of bilateral nasal bones with surrounding soft tissue swelling suggesting nondisplaced fracture. CT CERVICAL SPINE: 1. No acute fracture or traumatic subluxation. 2. Multilevel degenerate disc disease and facet joint arthropathy. 3. Multiple lucencies of the cervical vertebral bodies consistent with history of myeloma. Electronically Signed   By: Keane Police D.O.   On: 09/17/2022 13:26    EKG: Independently reviewed.  Atrial tachycardia with rate 103; nonspecific ST changes with no evidence of acute ischemia   Labs on Admission: I have personally reviewed the available labs and imaging studies at the time of the admission.  Pertinent labs:   hgb: 10.9,  MCV: 102, platelets 55,  BUN: 35,  creatinine: 2.20,  glucose: 308,   Assessment and Plan: Principal Problem:   Near syncope with dizziness and frequent falls Active Problems:   Acute renal failure superimposed on stage 3b chronic kidney disease (HCC)   Ambulatory dysfunction   Type 2 diabetes mellitus without complication (HCC)   Macrocytic anemia   Thrombocytopenia (HCC)   Multiple myeloma not having achieved remission (HCC)   Pulmonary embolism (HCC)   Mixed anxiety and depressive disorder   Chronic low back pain   Hyperlipidemia   Nasal fracture     Assessment and Plan: * Near syncope with dizziness and frequent falls 77 year old presenting with 5 day history of falling daily with prodromal dizziness in setting of acute on chronic kidney disease -obs to telemetry -orthostatics POSITIVE -TED hose -gentle IVF at 75cc/hour -strict I/O -CT head no acute findings and no acute neurological deficits on exam  -troponin initial wnl  -check echo  -polypharmacy may be contributing in setting of chronic narcotic use, BZD and ambien at '10mg'$  dosage. Decreased ambien to '5mg'$  -consider MRI brain in setting of MM   Acute renal failure superimposed on stage 3b chronic kidney disease ([place]) -Baseline creatinine 1.3-1.4  Unsure if this corresponds when she gets her chemo infusions States she has been drinking and eating well UA with no blood C/o dysuria, but no obvious infection on UA, culture pending in setting of immunocompromised state Strict I/O Urine studies Recent US with medical renal disease Gentle IVF overnight at 75cc/hour Avoid nephrotoxic drugs Trend   Ambulatory dysfunction Work up syncope IVF for orthostasis Acute on chronic renal injury  PT/OT Oncology would like MRI thoracic/lumbar spine with contrast, but will need to wait until renal function improves.  Fall  precautions   Type 2 diabetes mellitus without complication ([place]) 08/12/8249 hemoglobin A1c 8.7  She is on no medication for her diabetes Give fluids and SSI and accuchecks qac/hs  May need basal dosing  Needs outpatient adjustment   Macrocytic anemia Check iron studies, B12 and folate   Thrombocytopenia (HCC) Chronic thrombocytopenia On eliquis, monitor platelets closely   Multiple myeloma not having achieved remission ([place]) -Last daratumumab dose on 09/13/2022 -Dr. Delton Coombes consulted in ED -would like MRI thoracic/lumbar spine with renal function improves   Pulmonary embolism ([place]) Continue eliquis   Mixed anxiety and depressive disorder Continue  lexapro and xanax prn   Chronic low back pain Continue hydrocodone prn   Hyperlipidemia Continue crestor   Nasal fracture Nondisplaced ENT outpatient     Advance Care Planning:   Code Status: DNR   Consults: oncology   DVT Prophylaxis: eliquis   Family Communication: none   Severity of Illness: The appropriate patient status for this patient is OBSERVATION. Observation status is judged to be reasonable and necessary in order to provide the required intensity of service to ensure the patient's safety. The patient's presenting symptoms, physical exam findings, and initial radiographic and laboratory data in the context of their medical condition is felt to place them at decreased risk for further clinical deterioration. Furthermore, it is anticipated that the patient will be medically stable for discharge from the hospital within 2 midnights of admission.   Author: Orma Flaming, MD 09/17/2022 9:08 PM  For on call review [email or web address].

## 2022-09-17 NOTE — Telephone Encounter (Signed)
Patient called to advise that she has experienced dizziness upon standing and has fallen multiple times.  She is unsure if she may have hit her head, at some point.  She denies other associated symptoms.  Made her aware that if she feels she may have hit her head, she should go to the ER for evaluation, otherwise make an appointment with PCP.  Verbalized understanding.

## 2022-09-17 NOTE — Assessment & Plan Note (Signed)
Chronic thrombocytopenia On eliquis, monitor platelets closely

## 2022-09-17 NOTE — Assessment & Plan Note (Signed)
77 year old presenting with 5 day history of falling daily with prodromal dizziness in setting of acute on chronic kidney disease -obs to telemetry -orthostatics POSITIVE -TED hose -gentle IVF at 75cc/hour -strict I/O -CT head no acute findings and no acute neurological deficits on exam  -troponin initial wnl  -check echo  -polypharmacy may be contributing in setting of chronic narcotic use, BZD and ambien at '10mg'$  dosage. Decreased ambien to '5mg'$  -consider MRI brain in setting of MM

## 2022-09-17 NOTE — Assessment & Plan Note (Signed)
Continue lexapro and xanax prn

## 2022-09-17 NOTE — Assessment & Plan Note (Signed)
Continue eliquis  ?

## 2022-09-17 NOTE — Assessment & Plan Note (Addendum)
Work up syncope IVF for orthostasis Acute on chronic renal injury  PT/OT Oncology would like MRI thoracic/lumbar spine with contrast, but will need to wait until renal function improves.  Fall precautions

## 2022-09-17 NOTE — Assessment & Plan Note (Addendum)
-  Baseline creatinine 1.3-1.4  Unsure if this corresponds when she gets her chemo infusions States she has been drinking and eating well UA with no blood C/o dysuria, but no obvious infection on UA, culture pending in setting of immunocompromised state Strict I/O Urine studies Recent US with medical renal disease Gentle IVF overnight at 75cc/hour Avoid nephrotoxic drugs Trend

## 2022-09-17 NOTE — Assessment & Plan Note (Signed)
Continue hydrocodone prn

## 2022-09-17 NOTE — Assessment & Plan Note (Addendum)
08/15/2022 hemoglobin A1c 8.7  She is on no medication for her diabetes Give fluids and SSI and accuchecks qac/hs  May need basal dosing  Needs outpatient adjustment

## 2022-09-17 NOTE — Assessment & Plan Note (Signed)
Nondisplaced ENT outpatient

## 2022-09-17 NOTE — Assessment & Plan Note (Signed)
Check iron studies, B12 and folate

## 2022-09-17 NOTE — Assessment & Plan Note (Signed)
Continue crestor 

## 2022-09-17 NOTE — ED Triage Notes (Addendum)
Pt reports becoming dizzy this am, falling from standing position to floor, pt reports "busting" her nose, bleeding controlled at this time, pt reports she takes Eliquis, unknown LOC

## 2022-09-17 NOTE — Progress Notes (Signed)
Workup for hypoxia would include infectious causes.  Ronalee Belts

## 2022-09-17 NOTE — Assessment & Plan Note (Addendum)
-  Last daratumumab dose on 09/13/2022 -Dr. Delton Coombes consulted in ED -would like MRI thoracic/lumbar spine with renal function improves

## 2022-09-18 ENCOUNTER — Observation Stay (HOSPITAL_COMMUNITY): Payer: 59

## 2022-09-18 ENCOUNTER — Other Ambulatory Visit (HOSPITAL_COMMUNITY): Payer: Self-pay | Admitting: *Deleted

## 2022-09-18 DIAGNOSIS — D849 Immunodeficiency, unspecified: Secondary | ICD-10-CM | POA: Diagnosis present

## 2022-09-18 DIAGNOSIS — Z79899 Other long term (current) drug therapy: Secondary | ICD-10-CM | POA: Diagnosis not present

## 2022-09-18 DIAGNOSIS — E785 Hyperlipidemia, unspecified: Secondary | ICD-10-CM | POA: Diagnosis present

## 2022-09-18 DIAGNOSIS — D539 Nutritional anemia, unspecified: Secondary | ICD-10-CM | POA: Diagnosis present

## 2022-09-18 DIAGNOSIS — E1122 Type 2 diabetes mellitus with diabetic chronic kidney disease: Secondary | ICD-10-CM | POA: Diagnosis present

## 2022-09-18 DIAGNOSIS — N179 Acute kidney failure, unspecified: Secondary | ICD-10-CM | POA: Diagnosis present

## 2022-09-18 DIAGNOSIS — C9000 Multiple myeloma not having achieved remission: Secondary | ICD-10-CM | POA: Diagnosis present

## 2022-09-18 DIAGNOSIS — Z7901 Long term (current) use of anticoagulants: Secondary | ICD-10-CM | POA: Diagnosis not present

## 2022-09-18 DIAGNOSIS — R55 Syncope and collapse: Secondary | ICD-10-CM

## 2022-09-18 DIAGNOSIS — I951 Orthostatic hypotension: Secondary | ICD-10-CM | POA: Diagnosis present

## 2022-09-18 DIAGNOSIS — I129 Hypertensive chronic kidney disease with stage 1 through stage 4 chronic kidney disease, or unspecified chronic kidney disease: Secondary | ICD-10-CM | POA: Diagnosis present

## 2022-09-18 DIAGNOSIS — Z66 Do not resuscitate: Secondary | ICD-10-CM | POA: Diagnosis present

## 2022-09-18 DIAGNOSIS — N1832 Chronic kidney disease, stage 3b: Secondary | ICD-10-CM | POA: Diagnosis present

## 2022-09-18 DIAGNOSIS — E669 Obesity, unspecified: Secondary | ICD-10-CM | POA: Diagnosis present

## 2022-09-18 DIAGNOSIS — F418 Other specified anxiety disorders: Secondary | ICD-10-CM | POA: Diagnosis present

## 2022-09-18 DIAGNOSIS — D631 Anemia in chronic kidney disease: Secondary | ICD-10-CM | POA: Diagnosis present

## 2022-09-18 DIAGNOSIS — R262 Difficulty in walking, not elsewhere classified: Secondary | ICD-10-CM | POA: Diagnosis not present

## 2022-09-18 DIAGNOSIS — Z8249 Family history of ischemic heart disease and other diseases of the circulatory system: Secondary | ICD-10-CM | POA: Diagnosis not present

## 2022-09-18 DIAGNOSIS — S022XXA Fracture of nasal bones, initial encounter for closed fracture: Secondary | ICD-10-CM | POA: Diagnosis present

## 2022-09-18 DIAGNOSIS — D696 Thrombocytopenia, unspecified: Secondary | ICD-10-CM | POA: Diagnosis present

## 2022-09-18 DIAGNOSIS — M545 Low back pain, unspecified: Secondary | ICD-10-CM | POA: Diagnosis present

## 2022-09-18 DIAGNOSIS — Z9221 Personal history of antineoplastic chemotherapy: Secondary | ICD-10-CM | POA: Diagnosis not present

## 2022-09-18 DIAGNOSIS — W19XXXA Unspecified fall, initial encounter: Secondary | ICD-10-CM | POA: Diagnosis present

## 2022-09-18 DIAGNOSIS — R296 Repeated falls: Secondary | ICD-10-CM | POA: Diagnosis present

## 2022-09-18 DIAGNOSIS — G8929 Other chronic pain: Secondary | ICD-10-CM | POA: Diagnosis present

## 2022-09-18 DIAGNOSIS — E1165 Type 2 diabetes mellitus with hyperglycemia: Secondary | ICD-10-CM | POA: Diagnosis present

## 2022-09-18 DIAGNOSIS — E876 Hypokalemia: Secondary | ICD-10-CM | POA: Diagnosis not present

## 2022-09-18 LAB — BASIC METABOLIC PANEL
Anion gap: 8 (ref 5–15)
BUN: 30 mg/dL — ABNORMAL HIGH (ref 8–23)
CO2: 25 mmol/L (ref 22–32)
Calcium: 8.2 mg/dL — ABNORMAL LOW (ref 8.9–10.3)
Chloride: 100 mmol/L (ref 98–111)
Creatinine, Ser: 1.64 mg/dL — ABNORMAL HIGH (ref 0.44–1.00)
GFR, Estimated: 33 mL/min — ABNORMAL LOW (ref >60)
Glucose, Bld: 193 mg/dL — ABNORMAL HIGH (ref 70–99)
Potassium: 3.4 mmol/L — ABNORMAL LOW (ref 3.5–5.1)
Sodium: 133 mmol/L — ABNORMAL LOW (ref 135–145)

## 2022-09-18 LAB — CBC
HCT: 25.7 % — ABNORMAL LOW (ref 36.0–46.0)
Hemoglobin: 8.3 g/dL — ABNORMAL LOW (ref 12.0–15.0)
MCH: 33.2 pg (ref 26.0–34.0)
MCHC: 32.3 g/dL (ref 30.0–36.0)
MCV: 102.8 fL — ABNORMAL HIGH (ref 80.0–100.0)
Platelets: 46 10*3/uL — ABNORMAL LOW (ref 150–400)
RBC: 2.5 MIL/uL — ABNORMAL LOW (ref 3.87–5.11)
RDW: 14.3 % (ref 11.5–15.5)
WBC: 4.8 10*3/uL (ref 4.0–10.5)
nRBC: 0 % (ref 0.0–0.2)

## 2022-09-18 LAB — GLUCOSE, CAPILLARY
Glucose-Capillary: 193 mg/dL — ABNORMAL HIGH (ref 70–99)
Glucose-Capillary: 182 mg/dL — ABNORMAL HIGH (ref 70–99)
Glucose-Capillary: 155 mg/dL — ABNORMAL HIGH (ref 70–99)
Glucose-Capillary: 178 mg/dL — ABNORMAL HIGH (ref 70–99)

## 2022-09-18 LAB — ECHOCARDIOGRAM COMPLETE
Area-P 1/2: 3.37 cm2
Height: 63 in
S' Lateral: 2.9 cm
Weight: 2716.07 oz

## 2022-09-18 LAB — URINE CULTURE: Culture: >=100000 — AB

## 2022-09-18 MED ORDER — POTASSIUM CHLORIDE CRYS ER 20 MEQ PO TBCR
40.0000 meq | EXTENDED_RELEASE_TABLET | Freq: Once | ORAL | Status: AC
  Administered 2022-09-18: 40 meq via ORAL
  Filled 2022-09-18: qty 2

## 2022-09-18 MED ORDER — PROSOURCE PLUS PO LIQD
30.0000 mL | Freq: Three times a day (TID) | ORAL | Status: DC
  Administered 2022-09-18 – 2022-09-21 (×6): 30 mL via ORAL
  Filled 2022-09-18 (×7): qty 30

## 2022-09-18 MED ORDER — INSULIN GLARGINE-YFGN 100 UNIT/ML ~~LOC~~ SOLN
8.0000 [IU] | Freq: Every day | SUBCUTANEOUS | Status: DC
  Administered 2022-09-18 – 2022-09-21 (×4): 8 [IU] via SUBCUTANEOUS
  Filled 2022-09-18 (×5): qty 0.08

## 2022-09-18 MED ORDER — GLUCERNA SHAKE PO LIQD
237.0000 mL | Freq: Three times a day (TID) | ORAL | Status: DC
  Administered 2022-09-18 – 2022-09-21 (×6): 237 mL via ORAL

## 2022-09-18 MED ORDER — CHLORHEXIDINE GLUCONATE CLOTH 2 % EX PADS
6.0000 | MEDICATED_PAD | Freq: Every day | CUTANEOUS | Status: DC
  Administered 2022-09-18 – 2022-09-21 (×4): 6 via TOPICAL

## 2022-09-18 NOTE — TOC Initial Note (Signed)
Transition of Care [facility]) - Initial/Assessment Note    Patient Details  Name: Alexandra Price MRN: [id number] Date of Birth: 12-27-1945  Transition of [facility]) CM/SW Contact:    Shade Flood, LCSW Phone Number: 09/18/2022, 10:34 AM  Clinical Narrative:                  Pt admitted from home. PT recommending SNF rehab at dc. Spoke with pt today to assess and review dc planning. Per pt, she and her son reside together. She uses a cane or a walker for ambulation.  Discussed SNF recommendation and pt agreeable. CMS provider options reviewed. Will refer as requested. MD anticipating dc in 2-3 days. Will start insurance auth tomorrow if plan remains for dc Thursday vs Friday.  TOC will follow.  Expected Discharge Plan: Skilled Nursing Facility Barriers to Discharge: Continued Medical Work up   Patient Goals and CMS Choice Patient states their goals for this hospitalization and ongoing recovery are:: get better CMS Medicare.gov Compare Post Acute Care list provided to:: Patient Choice offered to / list presented to : Patient [place] ownership interest in [facility].provided to:: Patient    Expected Discharge Plan and Services In-house Referral: Clinical Social Work   Post Acute Care Choice: [place] Living arrangements for the past 2 months: [place]                                      Prior Living Arrangements/Services Living arrangements for the past 2 months: Single Family Home Lives with:: Adult Children Patient language and need for interpreter reviewed:: Yes Do you feel safe going back to the place where you live?: Yes      Need for Family Participation in Patient Care: Yes (Comment) Care giver support system in place?: Yes (comment) Current home services: DME Criminal Activity/Legal Involvement Pertinent to Current Situation/Hospitalization: No - Comment as needed  Activities of Daily Living Home Assistive  Devices/Equipment: Cane (specify quad or straight), Walker (specify type) ADL Screening (condition at time of admission) Patient's cognitive ability adequate to safely complete daily activities?: Yes Is the patient deaf or have difficulty hearing?: No Does the patient have difficulty seeing, even when Price glasses/contacts?: No Does the patient have difficulty concentrating, remembering, or making decisions?: No Patient able to express need for assistance with ADLs?: Yes Does the patient have difficulty dressing or bathing?: Yes (at baseline independent) Independently performs ADLs?: No Communication: Independent Dressing (OT): Needs assistance Is this a change from baseline?: Change from baseline, expected to last <3days Grooming: Needs assistance Is this a change from baseline?: Change from baseline, expected to last <3 days Feeding: Independent Bathing: Needs assistance Is this a change from baseline?: Change from baseline, expected to last <3 days Toileting: Needs assistance Is this a change from baseline?: Change from baseline, expected to last <3 days In/Out Bed: Needs assistance Is this a change from baseline?: Change from baseline, expected to last <3 days Walks in Home: Independent Does the patient have difficulty walking or climbing stairs?: Yes Weakness of Legs: Both Weakness of Arms/Hands: Both  Permission Sought/Granted Permission sought to share information with : Facility Art therapist granted to share information with : Yes, Verbal Permission Granted     Permission granted to share info w AGENCY: snfs        Emotional Assessment   Attitude/Demeanor/Rapport: Engaged Affect (typically observed): Pleasant  Orientation: : Oriented to Self, Oriented to Place, Oriented to  Time, Oriented to Situation Alcohol / Substance Use: Not Applicable Psych Involvement: No (comment)  Admission diagnosis:  AKI (acute kidney injury) ([place]) [N17.9] Acute  renal failure superimposed on stage 3b chronic kidney disease ([place]) [N17.9, N18.32] Patient Active Problem List   Diagnosis Date Noted   Hyperlipidemia 09/17/2022   Near syncope with dizziness and frequent falls 09/17/2022   Macrocytic anemia 09/17/2022   Ambulatory dysfunction 09/17/2022   Thrombocytopenia ([place]) 09/17/2022   Nasal fracture 09/17/2022   Acute renal failure superimposed on stage 3b chronic kidney disease ([place]) 09/03/2022   Pulmonary embolism ([place]) 08/14/2022   Chronic RUQ pain 04/30/2022   Constipation 04/30/2022   Type 2 diabetes mellitus without complication ([place]) 56/31/4970   Chronic low back pain 11/16/2021   Palliative care patient 06/26/2019   Mixed anxiety and depressive disorder 05/29/2018   Anxiety 05/28/2018   Pseudomonas infection 05/26/2018   Sinus tachycardia 05/22/2018   Multiple myeloma without remission ([place]) 04/15/2018   Multiple myeloma not having achieved remission ([place]) 04/07/2018   PCP:  [name], MD Pharmacy:   [place], [place] - [address] [place] [address] [place] [address] Phone: [phone] Fax: [phone]  [place], [address] [place] [place] [address] Phone: [phone] Fax: [phone]     Social Determinants of Health (SDOH) Social History: [place]: No Food Insecurity (09/17/2022)  Housing: Low Risk  (09/17/2022)  Transportation Needs: No Transportation Needs (09/17/2022)  Utilities: Not At Risk (09/17/2022)  Alcohol Screen: Low Risk  (07/06/2020)  Depression (PHQ2-9): Low Risk  (07/06/2020)  Financial Resource Strain: Low Risk  (07/06/2020)  Physical Activity: Inactive (07/06/2020)  Social Connections: Moderately Isolated (07/06/2020)  Stress: No Stress Concern Present (07/06/2020)  Tobacco Use: Low Risk  (09/17/2022)   SDOH Interventions: Housing Interventions:  Intervention Not Indicated   Readmission Risk Interventions    08/16/2022    1:48 PM  Readmission Risk Prevention Plan  Transportation Screening Complete  HRI or Home Care Consult Complete  Social Work Consult for [place] Planning/Counseling Complete  Palliative Care Screening Not Applicable  Medication Review Press photographer) Complete

## 2022-09-18 NOTE — Plan of Care (Signed)
  Problem: Acute Rehab OT Goals (only OT should resolve) Goal: Pt. Will Perform Grooming Flowsheets (Taken 09/18/2022 1557) Pt Will Perform Grooming:  with modified independence  standing Goal: Pt. Will Perform Upper Body Dressing Flowsheets (Taken 09/18/2022 1557) Pt Will Perform Upper Body Dressing:  with modified independence  sitting Goal: Pt. Will Perform Lower Body Dressing Flowsheets (Taken 09/18/2022 1557) Pt Will Perform Lower Body Dressing:  with modified independence  sitting/lateral leans Goal: Pt. Will Transfer To Toilet Flowsheets (Taken 09/18/2022 1557) Pt Will Transfer to Toilet:  with modified independence  ambulating Goal: Pt/Caregiver Will Perform Home Exercise Program Flowsheets (Taken 09/18/2022 1557) Pt/caregiver will Perform Home Exercise Program:  Increased strength  Both right and left upper extremity  Independently  Jesseca Marsch OT, MOT

## 2022-09-18 NOTE — NC FL2 (Signed)
[place] LEVEL OF CARE FORM     IDENTIFICATION  Patient Name: Alexandra Price Birthdate: 13-Jan-1946 Sex: female Admission Date (Current Location): 09/17/2022  [facility] and [place] Number:  [employer] and Address:  [place] [address], [place]      Provider Number: [id number]  Attending Physician Name and Address:  Dessa Phi, DO  Relative Name and Phone Number:       Current Level of Care: Hospital Recommended Level of Care: [place] Prior Approval Number:    Date Approved/Denied:   PASRR Number:    Discharge Plan: SNF    Current Diagnoses: Patient Active Problem List   Diagnosis Date Noted   Hyperlipidemia 09/17/2022   Near syncope with dizziness and frequent falls 09/17/2022   Macrocytic anemia 09/17/2022   Ambulatory dysfunction 09/17/2022   Thrombocytopenia ([place]) 09/17/2022   Nasal fracture 09/17/2022   Acute renal failure superimposed on stage 3b chronic kidney disease ([place]) 09/03/2022   Pulmonary embolism ([place]) 08/14/2022   Chronic RUQ pain 04/30/2022   Constipation 04/30/2022   Type 2 diabetes mellitus without complication (HCC) 14/78/2956   Chronic low back pain 11/16/2021   Palliative care patient 06/26/2019   Mixed anxiety and depressive disorder 05/29/2018   Anxiety 05/28/2018   Pseudomonas infection 05/26/2018   Sinus tachycardia 05/22/2018   Multiple myeloma without remission ([place]) 04/15/2018   Multiple myeloma not having achieved remission ([place]) 04/07/2018    Orientation RESPIRATION BLADDER Height & Weight     Self, Time, Situation, Place  Normal Continent Weight: 169 lb 12.1 oz (77 kg) Height:  '5\' 3"'$  (160 cm)  BEHAVIORAL SYMPTOMS/MOOD NEUROLOGICAL BOWEL NUTRITION STATUS      Continent Diet (see dc summary)  AMBULATORY STATUS COMMUNICATION OF NEEDS Skin   Extensive Assist Verbally Normal                       Personal Care Assistance Level of Assistance   Bathing, Feeding, Dressing Bathing Assistance: Limited assistance Feeding assistance: Independent Dressing Assistance: Limited assistance     Functional Limitations Info  Sight, Hearing, Speech Sight Info: Adequate Hearing Info: Adequate Speech Info: Adequate    SPECIAL CARE FACTORS FREQUENCY  PT (By licensed PT), OT (By licensed OT)     PT Frequency: 5x week OT Frequency: 3x week            Contractures Contractures Info: Not present    Additional Factors Info  Code Status, Allergies, Psychotropic Code Status Info: DNR Allergies Info: Ciprofloxacin, Amoxicillin, Morphine and Related, Augmentin Psychotropic Info: Xanax, Lexapro         Current Medications (09/18/2022):  This is the current hospital active medication list Current Facility-Administered Medications  Medication Dose Route Frequency Provider Last Rate Last Admin   0.9 %  sodium chloride infusion   Intravenous Continuous Orma Flaming, MD 75 mL/hr at 09/18/22 0511 New Bag at 09/18/22 0511   acetaminophen (TYLENOL) tablet 650 mg  650 mg Oral Q6H PRN Orma Flaming, MD   650 mg at 09/18/22 2130   Or   acetaminophen (TYLENOL) suppository 650 mg  650 mg Rectal Q6H PRN Orma Flaming, MD       acyclovir (ZOVIRAX) tablet 400 mg  400 mg Oral BID Orma Flaming, MD   400 mg at 09/18/22 8657   ALPRAZolam Duanne Moron) tablet 0.5 mg  0.5 mg Oral BID PRN Orma Flaming, MD       apixaban Arne [place])  tablet 2.5 mg  2.5 mg Oral BID Orma Flaming, MD   2.5 mg at 09/18/22 7341   Chlorhexidine Gluconate Cloth 2 % PADS 6 each  6 each Topical Daily Orma Flaming, MD   6 each at 09/18/22 0909   escitalopram (LEXAPRO) tablet 20 mg  20 mg Oral Daily Orma Flaming, MD   20 mg at 09/18/22 0909   HYDROcodone-acetaminophen (NORCO) 10-325 MG per tablet 1 tablet  1 tablet Oral Q6H PRN Orma Flaming, MD   1 tablet at 09/18/22 0910   insulin aspart (novoLOG) injection 0-9 Units  0-9 Units Subcutaneous TID [facility] Orma Flaming, MD   2 Units at  09/18/22 0910   insulin glargine-yfgn [facility]) injection 8 Units  8 Units Subcutaneous Daily Dessa Phi, DO       magnesium oxide (MAG-OX) tablet 400 mg  400 mg Oral BID Orma Flaming, MD   400 mg at 09/18/22 0910   pantoprazole (PROTONIX) EC tablet 40 mg  40 mg Oral Daily Orma Flaming, MD   40 mg at 09/18/22 9379   potassium chloride SA (KLOR-CON M) CR tablet 40 mEq  40 mEq Oral Once Dessa Phi, DO       rosuvastatin (CRESTOR) tablet 10 mg  10 mg Oral Daily Orma Flaming, MD   10 mg at 09/18/22 0910   sodium chloride flush (NS) 0.9 % injection 3 mL  3 mL Intravenous Q12H Orma Flaming, MD   3 mL at 09/18/22 0911   zolpidem (AMBIEN) tablet 5 mg  5 mg Oral QHS PRN Orma Flaming, MD   5 mg at 09/17/22 2059     Discharge Medications: Please see discharge summary for a list of discharge medications.  Relevant Imaging Results:  Relevant Lab Results:   Additional Information SSN: [id number] LCSW

## 2022-09-18 NOTE — Evaluation (Signed)
Physical Therapy Evaluation Patient Details Name: Alexandra Price MRN: [id number] DOB: June 20, 1946 Today's Date: 09/18/2022  History of Present Illness  Alexandra Price is a 77 y.o. female with medical history significant of CKD, MM, HTN, depression, CKD stage 3b, hx of PE on eliquis, T2DM who presented to ED after falling 5 times over the past 5 days. She will have dizziness before each episode.  She can not for sure say if she has lost consciousness, because she can't remember anything christmas day an feels like her memory is poor.  She states she will be dizzy after being up for a little bit right before she falls. She has dizziness with postural change too.  She states she has been eating and drinking fine.  She is weaker and feels like she has been on a decline for the past few weeks. She has tingling in the backs of her legs to the knee, but no loss of sensation. No urinary incontinence, no saddle paraesthesias. She also complains of urine color change, odor and dysuria.    Clinical Impression  Patient limited for functional mobility as stated below secondary to BLE weakness, fatigue and poor standing balance. Patient does not require assist for bed mobility and demonstrates good sitting balance and sitting tolerance at EOB. She requires assist to power up to standing with RW with c/o dizziness upon standing. Patient is limited to a few steps at bedside with RW to ambulate to chair and is limited by fatigue. Patient will benefit from continued physical therapy in hospital and recommended venue below to increase strength, balance, endurance for safe ADLs and gait.        Recommendations for follow up therapy are one component of a multi-disciplinary discharge planning process, led by the attending physician.  Recommendations may be updated based on patient status, additional functional criteria and insurance authorization.  Follow Up Recommendations Skilled nursing-short term rehab (<3  hours/day) Can patient physically be transported by private vehicle: Yes    Assistance Recommended at Discharge    Patient can return home with the following  A little help with walking and/or transfers;A little help with bathing/dressing/bathroom;Assistance with cooking/housework;Assist for transportation;Help with stairs or ramp for entrance    Equipment Recommendations None recommended by PT  Recommendations for Other Services       Functional Status Assessment Patient has had a recent decline in their functional status and demonstrates the ability to make significant improvements in function in a reasonable and predictable amount of time.     Precautions / Restrictions Precautions Precautions: Fall Restrictions Weight Bearing Restrictions: No      Mobility  Bed Mobility Overal bed mobility: Modified Independent             General bed mobility comments: labored    Transfers Overall transfer level: Needs assistance Equipment used: Rolling walker (2 wheels) Transfers: Sit to/from Stand, Bed to chair/wheelchair/BSC Sit to Stand: Min assist   Step pivot transfers: Min assist       General transfer comment: assist to power up to standing with RW, dizziness upon standing    Ambulation/Gait Ambulation/Gait assistance: Min assist Gait Distance (Feet): 3 Feet Assistive device: Rolling walker (2 wheels) Gait Pattern/deviations: Decreased stride length Gait velocity: decreased     General Gait Details: slow, small steps at bedside to chair with RW  Stairs            Wheelchair Mobility    Modified Rankin (Stroke Patients Only)  Balance Overall balance assessment: Needs assistance Sitting-balance support: No upper extremity supported, Feet supported Sitting balance-Leahy Scale: Good     Standing balance support: Bilateral upper extremity supported, Reliant on assistive device for balance Standing balance-Leahy Scale: Fair Standing balance  comment: with RW                             Pertinent Vitals/Pain Pain Assessment Pain Assessment: 0-10 Pain Score: 10-Worst pain ever Pain Location: legs Pain Descriptors / Indicators: Sore Pain Intervention(s): Limited activity within patient's tolerance, Monitored during session, Repositioned    Home Living Family/patient expects to be discharged to:: Private residence Living Arrangements: Children Available Help at Discharge: Family Type of Home: House Home Access: Ramped entrance       Home Layout: One level Home Equipment: Conservation officer, nature (2 wheels);Cane - single point;Shower seat;BSC/3in1      Prior Function Prior Level of Function : Needs assist;History of Falls (last six months)             Mobility Comments: patient states recent weakness/dizziness with increased falls leading to household ambualtion with RW ADLs Comments: independent with basic ADL     Hand Dominance        Extremity/Trunk Assessment   Upper Extremity Assessment Upper Extremity Assessment: Defer to OT evaluation    Lower Extremity Assessment Lower Extremity Assessment: Generalized weakness    Cervical / Trunk Assessment Cervical / Trunk Assessment: Normal  Communication   Communication: No difficulties  Cognition Arousal/Alertness: Awake/alert Behavior During Therapy: WFL for tasks assessed/performed Overall Cognitive Status: Within Functional Limits for tasks assessed                                          General Comments      Exercises     Assessment/Plan    PT Assessment Patient needs continued PT services  PT Problem List Decreased strength;Decreased activity tolerance;Decreased balance;Decreased mobility       PT Treatment Interventions DME instruction;Therapeutic exercise;Gait training;Balance training;Stair training;Neuromuscular re-education;Functional mobility training;Therapeutic activities;Patient/family education    PT  Goals (Current goals can be found in the Care Plan section)  Acute Rehab PT Goals Patient Stated Goal: return home PT Goal Formulation: With patient Time For Goal Achievement: 10/02/22 Potential to Achieve Goals: Fair    Frequency Min 3X/week     Co-evaluation               AM-PAC PT "6 Clicks" Mobility  Outcome Measure Help needed turning from your back to your side while in a flat bed without using bedrails?: None Help needed moving from lying on your back to sitting on the side of a flat bed without using bedrails?: A Little Help needed moving to and from a bed to a chair (including a wheelchair)?: A Little Help needed standing up from a chair using your arms (e.g., wheelchair or bedside chair)?: A Little Help needed to walk in hospital room?: A Lot Help needed climbing 3-5 steps with a railing? : A Lot 6 Click Score: 17    End of Session Equipment Utilized During Treatment: Gait belt Activity Tolerance: Patient limited by fatigue Patient left: in chair;with call bell/phone within reach Nurse Communication: Mobility status PT Visit Diagnosis: Unsteadiness on feet (R26.81);Other abnormalities of gait and mobility (R26.89);Muscle weakness (generalized) (M62.81);Repeated falls (R29.6)    Time: 3546-5681  PT Time Calculation (min) (ACUTE ONLY): 18 min   Charges:   PT Evaluation $PT Eval Low Complexity: 1 Low PT Treatments $Therapeutic Activity: 8-22 mins        9:07 AM, 09/18/22 Mearl Latin PT, DPT Physical Therapist at [facility]

## 2022-09-18 NOTE — Progress Notes (Signed)
*  PRELIMINARY RESULTS* Echocardiogram 2D Echocardiogram has been performed.  Alexandra Price 09/18/2022, 11:29 AM

## 2022-09-18 NOTE — Inpatient Diabetes Management (Signed)
Inpatient Diabetes Program Recommendations  AACE/ADA: New Consensus Statement on Inpatient Glycemic Control (2015)  Target Ranges:  Prepandial:   less than 140 mg/dL      Peak postprandial:   less than 180 mg/dL (1-2 hours)      Critically ill patients:  140 - 180 mg/dL   Lab Results  Component Value Date   GLUCAP 193 (H) 09/18/2022   HGBA1C 8.7 (H) 08/15/2022    Latest Reference Range & Units 09/14/22 10:58 09/17/22 16:36 09/17/22 21:28 09/18/22 07:28  Glucose-Capillary 70 - 99 mg/dL 202 (H) 235 (H) 208 (H) 193 (H)  (H): Data is abnormally high  Diabetes history: DM2 Outpatient Diabetes medications: None Current orders for Inpatient glycemic control: Novolog 0-9 units tid  Inpatient Diabetes Program Recommendations:   May consider: -Semglee 8 units now and q day (0.1 unit/kg x 77 kg)  Noted patient eating small amount of meals. Patient was just discharged home on 09/04/22. Will follow while inpatient.  Thank you, Nani Gasser. Seidy Labreck, RN, MSN, CDE  Diabetes Coordinator Inpatient Glycemic Control Team Team Pager [phone] (8am-5pm) 09/18/2022 9:57 AM

## 2022-09-18 NOTE — Plan of Care (Signed)
  Problem: Acute Rehab PT Goals(only PT should resolve) Goal: Patient Will Transfer Sit To/From Stand Outcome: Progressing Flowsheets (Taken 09/18/2022 0908) Patient will transfer sit to/from stand:  with supervision  with min guard assist Goal: Pt Will Transfer Bed To Chair/Chair To Bed Outcome: Progressing Flowsheets (Taken 09/18/2022 0908) Pt will Transfer Bed to Chair/Chair to Bed: min guard assist Goal: Pt Will Ambulate Outcome: Progressing Flowsheets (Taken 09/18/2022 0908) Pt will Ambulate:  25 feet  with min guard assist  with rolling walker Goal: Pt/caregiver will Perform Home Exercise Program Outcome: Progressing Flowsheets (Taken 09/18/2022 0908) Pt/caregiver will Perform Home Exercise Program:  For increased strengthening  For improved balance  Independently  9:09 AM, 09/18/22 Mearl Latin PT, DPT Physical Therapist at [facility]

## 2022-09-18 NOTE — Evaluation (Signed)
Occupational Therapy Evaluation Patient Details Name: Alexandra Price MRN: [id number] DOB: [birth year] Today's Date: 09/18/2022   History of Present Illness Alexandra Price is a 77 y.o. female with medical history significant of CKD, MM, HTN, depression, CKD stage 3b, hx of PE on eliquis, T2DM who presented to ED after falling 5 times over the past 5 days. She will have dizziness before each episode.  She can not for sure say if she has lost consciousness, because she can't remember anything christmas day an feels like her memory is poor.  She states she will be dizzy after being up for a little bit right before she falls. She has dizziness with postural change too.  She states she has been eating and drinking fine.  She is weaker and feels like she has been on a decline for the past few weeks. She has tingling in the backs of her legs to the knee, but no loss of sensation. No urinary incontinence, no saddle paraesthesias. She also complains of urine color change, odor and dysuria.   Clinical Impression   Pt agreeable to OT evaluation.  Pt presents with pain and general weakness leading to slow labored movement. Pt required min A to min G assist for sit to stand from EOB and toilet. Pt was able to ambulate in the room with min G assist using RW. Pt reports having family for 24/7 support, but this has also been the case over the recent days when pt has had 5 falls in 5 days. SNF recommended for safety based on pt's frequency of falls. Pt left in bed with call bell within reach. Pt will benefit from continued OT in the hospital and recommended venue below to increase strength, balance, and endurance for safe ADL's.        Recommendations for follow up therapy are one component of a multi-disciplinary discharge planning process, led by the attending physician.  Recommendations may be updated based on patient status, additional functional criteria and insurance authorization.   Follow Up Recommendations   Skilled nursing-short term rehab (<3 hours/day) Discussed home health with pt as well. SNF recommended due to frequency of falls in the past 5 days.     Assistance Recommended at Discharge Intermittent Supervision/Assistance  Patient can return home with the following A little help with walking and/or transfers;A little help with bathing/dressing/bathroom;Help with stairs or ramp for entrance;Assist for transportation;Assistance with cooking/housework    Functional Status Assessment  Patient has had a recent decline in their functional status and demonstrates the ability to make significant improvements in function in a reasonable and predictable amount of time.  Equipment Recommendations  None recommended by OT    Recommendations for Other Services       Precautions / Restrictions Precautions Precautions: Fall Restrictions Weight Bearing Restrictions: No      Mobility Bed Mobility Overal bed mobility: Modified Independent             General bed mobility comments: labored    Transfers Overall transfer level: Needs assistance Equipment used: Rolling walker (2 wheels) Transfers: Sit to/from Stand, Bed to chair/wheelchair/BSC Sit to Stand: Min guard, Min assist     Step pivot transfers: Min guard, Min assist     General transfer comment: Min A to power up to RW from EOB. Min G from toilet. Once standing pt was able to ambualte in the room with min G assist.      Balance Overall balance assessment: Needs assistance Sitting-balance support: No upper  extremity supported, Feet supported Sitting balance-Leahy Scale: Good Sitting balance - Comments: seated at EOB   Standing balance support: Bilateral upper extremity supported, Reliant on assistive device for balance Standing balance-Leahy Scale: Fair Standing balance comment: with RW                           ADL either performed or assessed with clinical judgement   ADL Overall ADL's : Needs  assistance/impaired     Grooming: Min guard;Standing   Upper Body Bathing: Set up;Sitting   Lower Body Bathing: Supervison/ safety;Sitting/lateral leans   Upper Body Dressing : Set up;Sitting   Lower Body Dressing: Min guard;Sitting/lateral leans Lower Body Dressing Details (indicate cue type and reason): Pt able to doff and don L sock seated at EOB but with labored effort and time. Reports of slight dizziness after donning sock leantion anteriorly over legs. Toilet Transfer: Min guard;Minimal assistance;Rolling walker (2 wheels);Ambulation Toilet Transfer Details (indicate cue type and reason): Pt able to ambulate to toilet with slow labored movement and min G to min A to boost from EOB. Toileting- Water quality scientist and Hygiene: Min guard;Sit to/from stand Toileting - Clothing Manipulation Details (indicate cue type and reason): Pt completed peri-care seated with lateral leans then pulled up her undergarments with min G assist.     Functional mobility during ADLs: Min guard;Rolling walker (2 wheels) General ADL Comments: Pt able to ambulate to toilet, then to sick, and back to bed with RW.     Vision Baseline Vision/History: 1 Wears glasses Ability to See in Adequate Light: 0 Adequate Patient Visual Report: Blurring of vision (Pt has had bouts of blurry vision. No blurry vision at times of evaluation.) Vision Assessment?: No apparent visual deficits Additional Comments: Pt had no obvious vision deficts during evaluation.                Pertinent Vitals/Pain Pain Assessment Pain Assessment: 0-10 Pain Score: 7  Pain Location: B legs and back Pain Descriptors / Indicators: Sharp Pain Intervention(s): Limited activity within patient's tolerance, Monitored during session, Repositioned     Hand Dominance Right   Extremity/Trunk Assessment Upper Extremity Assessment Upper Extremity Assessment: Generalized weakness (Mild deficits in shoulder A/ROM noted in L UE.)   Lower  Extremity Assessment Lower Extremity Assessment: Defer to PT evaluation   Cervical / Trunk Assessment Cervical / Trunk Assessment: Normal   Communication Communication Communication: No difficulties   Cognition Arousal/Alertness: Awake/alert Behavior During Therapy: WFL for tasks assessed/performed Overall Cognitive Status: Within Functional Limits for tasks assessed                                                        Home Living Family/patient expects to be discharged to:: Private residence Living Arrangements: Children Available Help at Discharge: Family;Available 24 hours/day Type of Home: House Home Access: Ramped entrance     Home Layout: One level     Bathroom Shower/Tub: Occupational psychologist: Standard Bathroom Accessibility: Yes   Home Equipment: Conservation officer, nature (2 wheels);Cane - single point;Shower seat;BSC/3in1;Grab bars - toilet          Prior Functioning/Environment Prior Level of Function : Needs assist;History of Falls (last six months)             Mobility Comments:  patient states recent weakness/dizziness with increased falls leading to household ambualtion with RW; 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 ADLs Comments: independent with ADL's and IADL's. Pt drove.        OT Problem List: Decreased strength;Decreased range of motion;Decreased activity tolerance;Impaired balance (sitting and/or standing)      OT Treatment/Interventions: Self-care/ADL training;Therapeutic exercise;Therapeutic activities;Patient/family education;Balance training    OT Goals(Current goals can be found in the care plan section) Acute Rehab OT Goals Patient Stated Goal: Return home OT Goal Formulation: With patient Time For Goal Achievement: 10/02/22 Potential to Achieve Goals: Good  OT Frequency: Min 2X/week                                   End of Session Equipment Utilized During Treatment:  Gait belt;Rolling walker (2 wheels)  Activity Tolerance: Patient tolerated treatment well Patient left: in bed;with call bell/phone within reach  OT Visit Diagnosis: Unsteadiness on feet (R26.81);Other abnormalities of gait and mobility (R26.89);Repeated falls (R29.6);History of falling (Z91.81);Muscle weakness (generalized) (M62.81)                Time: 8937-3428 OT Time Calculation (min): 22 min Charges:  OT General Charges $OT Visit: 1 Visit OT Evaluation $OT Eval Low Complexity: 1 Low  Dawnya Grams OT, MOT   Larey Seat 09/18/2022, 3:47 PM

## 2022-09-18 NOTE — Progress Notes (Signed)
Transition of Care (TOC) -30 day Note       Patient Details  Name: Alexandra Price Date of Birth: 1946-01-16   Transition of Care [facility]) CM/SW Contact  Name: Shade Flood Phone Number: [phone] Date: 09/18/2022 Time: 6314     To Whom it May Concern:   Please be advised that the above patient will require a short-term nursing home stay, anticipated 30 days or less rehabilitation and strengthening. The plan is for return home.

## 2022-09-18 NOTE — Progress Notes (Addendum)
PROGRESS NOTE    Alexandra Price  AJO:[id number] DOB: 1945/10/31 DOA: 09/17/2022 PCP: [name], MD     Brief Narrative:  Alexandra Price is a 77 y.o. female with medical history significant of CKD, MM, HTN, depression, CKD stage 3b, hx of PE on eliquis, T2DM who presented to ED after falling 5 times over the past 5 days. She will have dizziness before each episode.  She can not for sure say if she has lost consciousness, because she can't remember anything.  She states she will be dizzy after being up for a little bit right before she falls. She has dizziness with postural change too.  In the emergency department, she was found to have orthostatic hypotension.  She was given IV fluid bolus and admitted to the hospital.  New events last 24 hours / Subjective: States that she is feeling lousy.  Overall feeling nausea, tired, did not sleep last night.  States that she drinks water, tea and coffee at home.  Has had poor food intake.  Assessment & Plan:  Principal Problem:   Near syncope with dizziness and frequent falls Active Problems:   Acute renal failure superimposed on stage 3b chronic kidney disease (HCC)   Ambulatory dysfunction   Type 2 diabetes mellitus without complication (HCC)   Macrocytic anemia   Thrombocytopenia (HCC)   Multiple myeloma not having achieved remission (HCC)   Pulmonary embolism (HCC)   Mixed anxiety and depressive disorder   Chronic low back pain   Hyperlipidemia   Nasal fracture   Orthostatic hypotension with near syncope -Orthostatic vital signs remain positive -Continue IV fluid, TED hose -Echocardiogram pending -PT recommending SNF placement. TOC consult   Poor PO intake -Dietitian to see   AKI on CKD stage IIIb -Baseline creatinine 1.4 -Improved with IV fluid  Diabetes mellitus type 2 -Not on any medication as outpatient -Sliding scale insulin  Multiple myeloma -Followed by Dr. Delton Coombes -Last daratumumab dose on 09/13/2022   -Check MRI thoracic and lumbar spine to evaluate for mets - discussed with radiology. Due to her reduced GFR, recommend improvement in renal function and obtain MRI either later this hospital stay vs outpatient  PE -Eliquis  Mood disorder -Lexapro, xanax prn   Chronic back pain -Norco prn   HLD -Crestor   Nasal fracture -Follow up ENT outpatient   Hypokalemia -Replace   DVT prophylaxis:  apixaban (ELIQUIS) tablet 2.5 mg Start: 09/17/22 2200 Place TED hose Start: 09/17/22 1808 apixaban (ELIQUIS) tablet 2.5 mg  Code Status: DNR Family Communication: None at bedside  Disposition Plan:  Status is: Observation The patient will require care spanning > 2 midnights and should be moved to inpatient because: IVF, Need SNF, MRI pending  Consultants:  Oncology   Procedures:  None   Antimicrobials:  Anti-infectives (From admission, onward)    Start     Dose/Rate Route Frequency Ordered Stop   09/17/22 2200  acyclovir (ZOVIRAX) tablet 400 mg        400 mg Oral 2 times daily 09/17/22 1802          Objective: Vitals:   09/17/22 1738 09/17/22 2127 09/18/22 0353 09/18/22 0500  BP: 107/68 111/64 132/80   Pulse: 87 87 92   Resp: '18 18 18   '$ Temp: 98.1 F (36.7 C) 98.3 F (36.8 C) 98.6 F (37 C)   TempSrc: Oral     SpO2: 96% 94% 94%   Weight:    77 kg  Height: '5\' 3"'$  (1.6 m)  Intake/Output Summary (Last 24 hours) at 09/18/2022 0950 Last data filed at 09/18/2022 0700 Gross per 24 hour  Intake 2470.05 ml  Output --  Net 2470.05 ml   Filed Weights   09/17/22 1036 09/18/22 0500  Weight: 75.8 kg 77 kg    Examination:  General exam: Appears calm and comfortable  Respiratory system: Clear to auscultation. Respiratory effort normal. No respiratory distress. No conversational dyspnea.  Cardiovascular system: S1 & S2 heard, RRR. No murmurs. No pedal edema. Gastrointestinal system: Abdomen is nondistended, soft and nontender. Normal bowel sounds heard. Central  nervous system: Alert and oriented. No focal neurological deficits. Speech clear.  Extremities: Symmetric in appearance  Skin: No rashes, lesions or ulcers on exposed skin  Psychiatry: Judgement and insight appear normal. Mood & affect appropriate.   Data Reviewed: I have personally reviewed following labs and imaging studies  CBC: Recent Labs  Lab 09/14/22 1225 09/17/22 1211 09/18/22 0418  WBC 4.8 8.1 4.8  NEUTROABS 3.8 7.2  --   HGB 10.0* 10.9* 8.3*  HCT 31.0* 33.0* 25.7*  MCV 102.3* 102.2* 102.8*  PLT 80* 55* 46*   Basic Metabolic Panel: Recent Labs  Lab 09/14/22 1225 09/17/22 1211 09/17/22 1619 09/18/22 0418  NA 134* 131*  --  133*  K 3.6 4.4  --  3.4*  CL 96* 94*  --  100  CO2 27 24  --  25  GLUCOSE 180* 308*  --  193*  BUN 32* 35*  --  30*  CREATININE 1.90* 2.20*  --  1.64*  CALCIUM 9.0 9.0  --  8.2*  MG  --   --  1.9  --    GFR: Estimated Creatinine Clearance: 29.6 mL/min (A) (by C-G formula based on SCr of 1.64 mg/dL (H)). Liver Function Tests: Recent Labs  Lab 09/14/22 1225  AST 20  ALT 22  ALKPHOS 64  BILITOT 0.5  PROT 6.5  ALBUMIN 3.7   Recent Labs  Lab 09/14/22 1225  LIPASE 46   No results for input(s): "AMMONIA" in the last 168 hours. Coagulation Profile: Recent Labs  Lab 09/17/22 1619  INR 1.1   Cardiac Enzymes: Recent Labs  Lab 09/17/22 1619  CKTOTAL 29*   BNP (last 3 results) No results for input(s): "PROBNP" in the last 8760 hours. HbA1C: No results for input(s): "HGBA1C" in the last 72 hours. CBG: Recent Labs  Lab 09/14/22 1058 09/17/22 1636 09/17/22 2128 09/18/22 0728  GLUCAP 202* 235* 208* 193*   Lipid Profile: No results for input(s): "CHOL", "HDL", "LDLCALC", "TRIG", "CHOLHDL", "LDLDIRECT" in the last 72 hours. Thyroid Function Tests: Recent Labs    09/17/22 1619  TSH 0.956   Anemia Panel: Recent Labs    09/17/22 1619  VITAMINB12 467  FERRITIN 1,059*  TIBC 290  IRON 29   Sepsis Labs: Recent Labs   Lab 09/14/22 1359  LATICACIDVEN 0.9    No results found for this or any previous visit (from the past 240 hour(s)).    Radiology Studies: CT Head Wo Contrast  Result Date: 09/17/2022 CLINICAL DATA:  Head trauma, intracranial injury suspected. Patient became dizzy and fell face first on the floor. EXAM: CT HEAD WITHOUT CONTRAST CT MAXILLOFACIAL WITHOUT CONTRAST CT CERVICAL SPINE WITHOUT CONTRAST TECHNIQUE: Multidetector CT imaging of the head, cervical spine, and maxillofacial structures were performed using the standard protocol without intravenous contrast. Multiplanar CT image reconstructions of the cervical spine and maxillofacial structures were also generated. RADIATION DOSE REDUCTION: This exam was performed according to the  departmental dose-optimization program which includes automated exposure control, adjustment of the mA and/or kV according to patient size and/or use of iterative reconstruction technique. COMPARISON:  None Available. FINDINGS: CT HEAD FINDINGS Brain: No evidence of acute infarction, hemorrhage, hydrocephalus, extra-axial collection or mass lesion/mass effect. Patchy areas of low-attenuation suggesting moderate chronic microvascular ischemic changes. Vascular: No hyperdense vessel or unexpected calcification. Skull: Multiple calvarial lucencies consistent with history of myeloma. Negative for fracture or focal lesion. Other: None. CT MAXILLOFACIAL FINDINGS Osseous: Mild irregularity of bilateral nasal bones with surrounding soft tissue swelling suggesting nondisplaced fracture. Orbits: Negative. No traumatic or inflammatory finding. Sinuses: Clear. Soft tissues: Soft tissue swelling about the nose. No drainable fluid collection or hematoma. CT CERVICAL SPINE FINDINGS Alignment: Normal. Skull base and vertebrae: Multiple lucencies of the cervical vertebral bodies without evidence of acute fracture. Soft tissues and spinal canal: No prevertebral fluid or swelling. No visible  canal hematoma. Disc levels: Multilevel degenerate disc disease with disc height loss. Multilevel facet joint arthropathy. Upper chest: Biapical pleural/parenchymal scarring. Other: None IMPRESSION: CT HEAD: 1. No acute intracranial abnormality. 2. Multiple calvarial lucencies consistent with history of myeloma. CT MAXILLOFACIAL: Mild irregularity of bilateral nasal bones with surrounding soft tissue swelling suggesting nondisplaced fracture. CT CERVICAL SPINE: 1. No acute fracture or traumatic subluxation. 2. Multilevel degenerate disc disease and facet joint arthropathy. 3. Multiple lucencies of the cervical vertebral bodies consistent with history of myeloma. Electronically Signed   By: Keane Police D.O.   On: 09/17/2022 13:26   CT Cervical Spine Wo Contrast  Result Date: 09/17/2022 CLINICAL DATA:  Head trauma, intracranial injury suspected. Patient became dizzy and fell face first on the floor. EXAM: CT HEAD WITHOUT CONTRAST CT MAXILLOFACIAL WITHOUT CONTRAST CT CERVICAL SPINE WITHOUT CONTRAST TECHNIQUE: Multidetector CT imaging of the head, cervical spine, and maxillofacial structures were performed using the standard protocol without intravenous contrast. Multiplanar CT image reconstructions of the cervical spine and maxillofacial structures were also generated. RADIATION DOSE REDUCTION: This exam was performed according to the departmental dose-optimization program which includes automated exposure control, adjustment of the mA and/or kV according to patient size and/or use of iterative reconstruction technique. COMPARISON:  None Available. FINDINGS: CT HEAD FINDINGS Brain: No evidence of acute infarction, hemorrhage, hydrocephalus, extra-axial collection or mass lesion/mass effect. Patchy areas of low-attenuation suggesting moderate chronic microvascular ischemic changes. Vascular: No hyperdense vessel or unexpected calcification. Skull: Multiple calvarial lucencies consistent with history of myeloma.  Negative for fracture or focal lesion. Other: None. CT MAXILLOFACIAL FINDINGS Osseous: Mild irregularity of bilateral nasal bones with surrounding soft tissue swelling suggesting nondisplaced fracture. Orbits: Negative. No traumatic or inflammatory finding. Sinuses: Clear. Soft tissues: Soft tissue swelling about the nose. No drainable fluid collection or hematoma. CT CERVICAL SPINE FINDINGS Alignment: Normal. Skull base and vertebrae: Multiple lucencies of the cervical vertebral bodies without evidence of acute fracture. Soft tissues and spinal canal: No prevertebral fluid or swelling. No visible canal hematoma. Disc levels: Multilevel degenerate disc disease with disc height loss. Multilevel facet joint arthropathy. Upper chest: Biapical pleural/parenchymal scarring. Other: None IMPRESSION: CT HEAD: 1. No acute intracranial abnormality. 2. Multiple calvarial lucencies consistent with history of myeloma. CT MAXILLOFACIAL: Mild irregularity of bilateral nasal bones with surrounding soft tissue swelling suggesting nondisplaced fracture. CT CERVICAL SPINE: 1. No acute fracture or traumatic subluxation. 2. Multilevel degenerate disc disease and facet joint arthropathy. 3. Multiple lucencies of the cervical vertebral bodies consistent with history of myeloma. Electronically Signed   By: Judye Bos.O.  On: 09/17/2022 13:26   CT Maxillofacial Wo Contrast  Result Date: 09/17/2022 CLINICAL DATA:  Head trauma, intracranial injury suspected. Patient became dizzy and fell face first on the floor. EXAM: CT HEAD WITHOUT CONTRAST CT MAXILLOFACIAL WITHOUT CONTRAST CT CERVICAL SPINE WITHOUT CONTRAST TECHNIQUE: Multidetector CT imaging of the head, cervical spine, and maxillofacial structures were performed using the standard protocol without intravenous contrast. Multiplanar CT image reconstructions of the cervical spine and maxillofacial structures were also generated. RADIATION DOSE REDUCTION: This exam was performed  according to the departmental dose-optimization program which includes automated exposure control, adjustment of the mA and/or kV according to patient size and/or use of iterative reconstruction technique. COMPARISON:  None Available. FINDINGS: CT HEAD FINDINGS Brain: No evidence of acute infarction, hemorrhage, hydrocephalus, extra-axial collection or mass lesion/mass effect. Patchy areas of low-attenuation suggesting moderate chronic microvascular ischemic changes. Vascular: No hyperdense vessel or unexpected calcification. Skull: Multiple calvarial lucencies consistent with history of myeloma. Negative for fracture or focal lesion. Other: None. CT MAXILLOFACIAL FINDINGS Osseous: Mild irregularity of bilateral nasal bones with surrounding soft tissue swelling suggesting nondisplaced fracture. Orbits: Negative. No traumatic or inflammatory finding. Sinuses: Clear. Soft tissues: Soft tissue swelling about the nose. No drainable fluid collection or hematoma. CT CERVICAL SPINE FINDINGS Alignment: Normal. Skull base and vertebrae: Multiple lucencies of the cervical vertebral bodies without evidence of acute fracture. Soft tissues and spinal canal: No prevertebral fluid or swelling. No visible canal hematoma. Disc levels: Multilevel degenerate disc disease with disc height loss. Multilevel facet joint arthropathy. Upper chest: Biapical pleural/parenchymal scarring. Other: None IMPRESSION: CT HEAD: 1. No acute intracranial abnormality. 2. Multiple calvarial lucencies consistent with history of myeloma. CT MAXILLOFACIAL: Mild irregularity of bilateral nasal bones with surrounding soft tissue swelling suggesting nondisplaced fracture. CT CERVICAL SPINE: 1. No acute fracture or traumatic subluxation. 2. Multilevel degenerate disc disease and facet joint arthropathy. 3. Multiple lucencies of the cervical vertebral bodies consistent with history of myeloma. Electronically Signed   By: Keane Police D.O.   On: 09/17/2022 13:26       Scheduled Meds:  acyclovir  400 mg Oral BID   apixaban  2.5 mg Oral BID   Chlorhexidine Gluconate Cloth  6 each Topical Daily   escitalopram  20 mg Oral Daily   insulin aspart  0-9 Units Subcutaneous TID WC   magnesium oxide  400 mg Oral BID   pantoprazole  40 mg Oral Daily   rosuvastatin  10 mg Oral Daily   sodium chloride flush  3 mL Intravenous Q12H   Continuous Infusions:  sodium chloride 75 mL/hr at 09/18/22 0511     LOS: 0 days   Time spent: 35 minutes   Dessa Phi, DO [facility] 09/18/2022, 9:50 AM   Available via Epic secure chat 7am-7pm After these hours, please refer to coverage provider listed on [email or web address]

## 2022-09-19 DIAGNOSIS — C9000 Multiple myeloma not having achieved remission: Secondary | ICD-10-CM | POA: Diagnosis not present

## 2022-09-19 DIAGNOSIS — I951 Orthostatic hypotension: Secondary | ICD-10-CM

## 2022-09-19 DIAGNOSIS — N1832 Chronic kidney disease, stage 3b: Secondary | ICD-10-CM

## 2022-09-19 DIAGNOSIS — R262 Difficulty in walking, not elsewhere classified: Secondary | ICD-10-CM

## 2022-09-19 DIAGNOSIS — N179 Acute kidney failure, unspecified: Secondary | ICD-10-CM | POA: Diagnosis not present

## 2022-09-19 DIAGNOSIS — R55 Syncope and collapse: Secondary | ICD-10-CM | POA: Diagnosis not present

## 2022-09-19 LAB — BASIC METABOLIC PANEL
Anion gap: 5 (ref 5–15)
BUN: 20 mg/dL (ref 8–23)
CO2: 26 mmol/L (ref 22–32)
Calcium: 8.5 mg/dL — ABNORMAL LOW (ref 8.9–10.3)
Chloride: 102 mmol/L (ref 98–111)
Creatinine, Ser: 1.27 mg/dL — ABNORMAL HIGH (ref 0.44–1.00)
GFR, Estimated: 44 mL/min — ABNORMAL LOW (ref >60)
Glucose, Bld: 158 mg/dL — ABNORMAL HIGH (ref 70–99)
Potassium: 3.6 mmol/L (ref 3.5–5.1)
Sodium: 133 mmol/L — ABNORMAL LOW (ref 135–145)

## 2022-09-19 LAB — CBC
HCT: 25.7 % — ABNORMAL LOW (ref 36.0–46.0)
Hemoglobin: 8.3 g/dL — ABNORMAL LOW (ref 12.0–15.0)
MCH: 33.2 pg (ref 26.0–34.0)
MCHC: 32.3 g/dL (ref 30.0–36.0)
MCV: 102.8 fL — ABNORMAL HIGH (ref 80.0–100.0)
Platelets: 56 10*3/uL — ABNORMAL LOW (ref 150–400)
RBC: 2.5 MIL/uL — ABNORMAL LOW (ref 3.87–5.11)
RDW: 14 % (ref 11.5–15.5)
WBC: 4.2 10*3/uL (ref 4.0–10.5)
nRBC: 0 % (ref 0.0–0.2)

## 2022-09-19 LAB — GLUCOSE, CAPILLARY
Glucose-Capillary: 164 mg/dL — ABNORMAL HIGH (ref 70–99)
Glucose-Capillary: 132 mg/dL — ABNORMAL HIGH (ref 70–99)
Glucose-Capillary: 151 mg/dL — ABNORMAL HIGH (ref 70–99)

## 2022-09-19 LAB — MAGNESIUM: Magnesium: 1.8 mg/dL (ref 1.7–2.4)

## 2022-09-19 NOTE — Progress Notes (Signed)
PROGRESS NOTE    Alexandra Price  ZOX:[id number] DOB: 1946-07-19 DOA: 09/17/2022 PCP: [name], MD     Brief Narrative:  Alexandra Price is a 77 y.o. female with medical history significant of CKD, MM, HTN, depression, CKD stage 3b, hx of PE on eliquis, T2DM who presented to ED after falling 5 times over the past 5 days. She will have dizziness before each episode.  She can not for sure say if she has lost consciousness, because she can't remember anything.  She states she will be dizzy after being up for a little bit right before she falls. She has dizziness with postural change too.  In the emergency department, she was found to have orthostatic hypotension.  She was given IV fluid bolus and admitted to the hospital.   New events last 24 hours / Subjective: Patient noted to have a fall earlier this morning while ambulating from the bathroom back to her bed, did not ask for any assistance.  Advised patient to always call for help before getting out of bed due to orthostatic hypotension.  Patient complained of some dysuria.  Noted to be pretty sleepy, but was able to engage in conversation.    Assessment & Plan:  Principal Problem:   Near syncope with dizziness and frequent falls Active Problems:   Acute renal failure superimposed on stage 3b chronic kidney disease (HCC)   Ambulatory dysfunction   Type 2 diabetes mellitus without complication (HCC)   Macrocytic anemia   Thrombocytopenia (HCC)   Multiple myeloma not having achieved remission (HCC)   Pulmonary embolism (HCC)   Mixed anxiety and depressive disorder   Chronic low back pain   Hyperlipidemia   Nasal fracture   Orthostatic hypotension with near syncope Fall Orthostatic vital signs remains positive Continue IV fluid, TED hose, plan to start midodrine if orthostasis persists Echocardiogram with a EF of 55 to 60%, no regional wall motion abnormalities, grade 1 diastolic dysfunction PT recommending SNF  placement  ?UTI Patient reported dysuria Initial UA showed large leukocytes, rare bacteria, greater than 50 WBC Urine culture pending  Poor PO intake Dietitian consulted  AKI on CKD stage IIIb Baseline creatinine 1.4 Improved with IV fluid  Diabetes mellitus type 2 Not on any medication as outpatient Sliding scale insulin, semglee  Multiple myeloma Followed by Dr. Delton Coombes Last daratumumab dose on 09/13/2022  Check MRI thoracic and lumbar spine to evaluate for mets pending  Anemia of chronic disease Hemoglobin around baseline Denies any signs of bleeding Anemia panel showed iron 29, sats Tedd, ferritin >1000 Daily CBC  PE Eliquis  Mood disorder Lexapro, xanax prn   Chronic back pain Norco prn   HLD Crestor   Nasal fracture Follow up ENT outpatient     DVT prophylaxis:  apixaban (ELIQUIS) tablet 2.5 mg Start: 09/17/22 2200 Place TED hose Start: 09/17/22 1808 apixaban (ELIQUIS) tablet 2.5 mg  Code Status: DNR Family Communication: None at bedside  Disposition Plan:  Status is: Inpatient    Consultants:  EDP spoke with Oncology   Procedures:  None   Antimicrobials:  Anti-infectives (From admission, onward)    Start     Dose/Rate Route Frequency Ordered Stop   09/17/22 2200  acyclovir (ZOVIRAX) tablet 400 mg        400 mg Oral 2 times daily 09/17/22 1802          Objective: Vitals:   09/18/22 1215 09/18/22 2054 09/19/22 0355 09/19/22 0500  BP: (!) 143/85 (!) 159/77 Marland Kitchen)  164/84   Pulse: 84 83 100   Resp: '20 19 16   '$ Temp: 98.3 F (36.8 C) 98.7 F (37.1 C) 98.1 F (36.7 C)   TempSrc: Oral     SpO2: 96% 96% 96%   Weight:    81.5 kg  Height:        Intake/Output Summary (Last 24 hours) at 09/19/2022 1844 Last data filed at 09/19/2022 1333 Price per 24 hour  Intake 2087.15 ml  Output 1500 ml  Net 587.15 ml   Filed Weights   09/17/22 1036 09/18/22 0500 09/19/22 0500  Weight: 75.8 kg 77 kg 81.5 kg    Examination:  General: NAD,  appears weak  Cardiovascular: S1, S2 present Respiratory: CTAB Abdomen: Soft, nontender, nondistended, bowel sounds present Musculoskeletal: No bilateral pedal edema noted Skin: Normal Psychiatry: Normal mood    Data Reviewed: I have personally reviewed following labs and imaging studies  CBC: Recent Labs  Lab 09/14/22 1225 09/17/22 1211 09/18/22 0418 09/19/22 0429  WBC 4.8 8.1 4.8 4.2  NEUTROABS 3.8 7.2  --   --   HGB 10.0* 10.9* 8.3* 8.3*  HCT 31.0* 33.0* 25.7* 25.7*  MCV 102.3* 102.2* 102.8* 102.8*  PLT 80* 55* 46* 56*   Basic Metabolic Panel: Recent Labs  Lab 09/14/22 1225 09/17/22 1211 09/17/22 1619 09/18/22 0418 09/19/22 0429  NA 134* 131*  --  133* 133*  K 3.6 4.4  --  3.4* 3.6  CL 96* 94*  --  100 102  CO2 27 24  --  25 26  GLUCOSE 180* 308*  --  193* 158*  BUN 32* 35*  --  30* 20  CREATININE 1.90* 2.20*  --  1.64* 1.27*  CALCIUM 9.0 9.0  --  8.2* 8.5*  MG  --   --  1.9  --  1.8   GFR: Estimated Creatinine Clearance: 39.3 mL/min (A) (by C-G formula based on SCr of 1.27 mg/dL (H)). Liver Function Tests: Recent Labs  Lab 09/14/22 1225  AST 20  ALT 22  ALKPHOS 64  BILITOT 0.5  PROT 6.5  ALBUMIN 3.7   Recent Labs  Lab 09/14/22 1225  LIPASE 46   No results for input(s): "AMMONIA" in the last 168 hours. Coagulation Profile: Recent Labs  Lab 09/17/22 1619  INR 1.1   Cardiac Enzymes: Recent Labs  Lab 09/17/22 1619  CKTOTAL 29*   BNP (last 3 results) No results for input(s): "PROBNP" in the last 8760 hours. HbA1C: No results for input(s): "HGBA1C" in the last 72 hours. CBG: Recent Labs  Lab 09/18/22 1127 09/18/22 1602 09/18/22 2056 09/19/22 1134 09/19/22 1624  GLUCAP 182* 155* 178* 164* 132*   Lipid Profile: No results for input(s): "CHOL", "HDL", "LDLCALC", "TRIG", "CHOLHDL", "LDLDIRECT" in the last 72 hours. Thyroid Function Tests: Recent Labs    09/17/22 1619  TSH 0.956   Anemia Panel: Recent Labs    09/17/22 1619   VITAMINB12 467  FERRITIN 1,059*  TIBC 290  IRON 29   Sepsis Labs: Recent Labs  Lab 09/14/22 1359  LATICACIDVEN 0.9    Recent Results (from the past 240 hour(s))  Urine Culture (for pregnant, neutropenic or urologic patients or patients with an indwelling urinary catheter)     Status: Abnormal   Collection Time: 09/17/22  4:28 PM   Specimen: Urine, Clean Catch  Result Value Ref Range Status   Specimen Description   Final    URINE, CLEAN CATCH Performed at [facility], [address] [place], [place]  [place]    Special Requests   Final    Immunocompromised Performed at [facility], [address], [place], [place] [address]    Culture (A)  Final    >=100,000 COLONIES/mL DIPHTHEROIDS(CORYNEBACTERIUM SPECIES) Standardized susceptibility testing for this organism is not available. Performed at [facility], [place] [address] [place], [place] [address]    Report Status 09/18/2022 FINAL  Final      Radiology Studies: ECHOCARDIOGRAM COMPLETE  Result Date: 09/18/2022    ECHOCARDIOGRAM REPORT   Patient Name:   LATASIA SILBERSTEIN Date of Exam: 09/18/2022 Medical Rec #:  [id number]       Height:       63.0 in Accession #:    [id number]      Weight:       169.8 lb Date of Birth:  [birth year]       BSA:          1.803 m Patient Age:    16 years        BP:           132/80 mmHg Patient Gender: F               HR:           79 bpm. Exam Location:  [name] Procedure: 2D Echo, Cardiac Doppler and Color Doppler Indications:    R55 Syncope  History:        Patient has prior history of Echocardiogram examinations, most                 recent 05/28/2018. Risk Factors:Hypertension, Diabetes and                 Dyslipidemia. Multiple myeloma not having achieved remission,                 Acute renal failure superimposed on stage 3b chronic kidney                 disease.  Sonographer:    Alvino Chapel RCS Referring Phys: [id number] [place]  1. Left ventricular ejection fraction, by  estimation, is 55 to 60%. The left ventricle has normal function. The left ventricle has no regional wall motion abnormalities. Left ventricular diastolic parameters are consistent with Grade I diastolic dysfunction (impaired relaxation).  2. Right ventricular systolic function is normal. The right ventricular size is normal. Tricuspid regurgitation signal is inadequate for assessing PA pressure.  3. Left atrial size was mild to moderately dilated.  4. The mitral valve is abnormal. Trivial mitral valve regurgitation. No evidence of mitral stenosis.  5. The aortic valve is tricuspid. There is mild calcification of the aortic valve. Aortic valve regurgitation is not visualized. No aortic stenosis is present.  6. The inferior vena cava is normal in size with greater than 50% respiratory variability, suggesting right atrial pressure of 3 mmHg. Comparison(s): No prior Echocardiogram. FINDINGS  Left Ventricle: Left ventricular ejection fraction, by estimation, is 55 to 60%. The left ventricle has normal function. The left ventricle has no regional wall motion abnormalities. The left ventricular internal cavity size was normal in size. There is  no left ventricular hypertrophy. Left ventricular diastolic parameters are consistent with Grade I diastolic dysfunction (impaired relaxation). Right Ventricle: The right ventricular size is normal. No increase in right ventricular wall thickness. Right ventricular systolic function is normal. Tricuspid regurgitation signal is inadequate for assessing PA pressure. Left Atrium: Left atrial size was mild to moderately dilated. Right  Atrium: Right atrial size was normal in size. Pericardium: There is no evidence of pericardial effusion. Mitral Valve: The mitral valve is abnormal. Mild to moderate mitral annular calcification. Trivial mitral valve regurgitation. No evidence of mitral valve stenosis. Tricuspid Valve: The tricuspid valve is not well visualized. Tricuspid valve  regurgitation is not demonstrated. No evidence of tricuspid stenosis. Aortic Valve: The aortic valve is tricuspid. There is mild calcification of the aortic valve. Aortic valve regurgitation is not visualized. No aortic stenosis is present. Pulmonic Valve: The pulmonic valve was not well visualized. Pulmonic valve regurgitation is not visualized. No evidence of pulmonic stenosis. Aorta: The aortic root is normal in size and structure. Venous: The inferior vena cava is normal in size with greater than 50% respiratory variability, suggesting right atrial pressure of 3 mmHg. IAS/Shunts: No atrial level shunt detected by color flow Doppler.  LEFT VENTRICLE PLAX 2D LVIDd:         4.60 cm   Diastology LVIDs:         2.90 cm   LV e' medial:    7.62 cm/s LV PW:         0.90 cm   LV E/e' medial:  12.7 LV IVS:        0.90 cm   LV e' lateral:   8.81 cm/s LVOT diam:     1.90 cm   LV E/e' lateral: 11.0 LV SV:         87 LV SV Index:   48 LVOT Area:     2.84 cm  RIGHT VENTRICLE RV S prime:     13.60 cm/s TAPSE (M-mode): 2.0 cm LEFT ATRIUM             Index        RIGHT ATRIUM           Index LA diam:        3.60 cm 2.00 cm/m   RA Area:     11.90 cm LA Vol (A2C):   75.3 ml 41.75 ml/m  RA Volume:   25.80 ml  14.31 ml/m LA Vol (A4C):   58.1 ml 32.22 ml/m LA Biplane Vol: 69.2 ml 38.37 ml/m  AORTIC VALVE LVOT Vmax:   153.00 cm/s LVOT Vmean:  95.800 cm/s LVOT VTI:    0.306 m  AORTA Ao Root diam: 3.20 cm MITRAL VALVE MV Area (PHT): 3.37 cm     SHUNTS MV Decel Time: 225 msec     Systemic VTI:  0.31 m MV E velocity: 96.80 cm/s   Systemic Diam: 1.90 cm MV A velocity: 115.00 cm/s MV E/A ratio:  0.84 Vishnu Priya Mallipeddi Electronically signed by Lorelee Cover Mallipeddi Signature Date/Time: 09/18/2022/1:47:41 PM    Final       Scheduled Meds:  (feeding supplement) PROSource Plus  30 mL Oral TID WC   acyclovir  400 mg Oral BID   apixaban  2.5 mg Oral BID   Chlorhexidine Gluconate Cloth  6 each Topical Daily   escitalopram   20 mg Oral Daily   feeding supplement (GLUCERNA SHAKE)  237 mL Oral TID BM   insulin aspart  0-9 Units Subcutaneous TID WC   insulin glargine-yfgn  8 Units Subcutaneous Daily   magnesium oxide  400 mg Oral BID   pantoprazole  40 mg Oral Daily   rosuvastatin  10 mg Oral Daily   sodium chloride flush  3 mL Intravenous Q12H   Continuous Infusions:  sodium chloride 75 mL/hr at 09/19/22 1333  LOS: 1 day   Time spent: 35 minutes   Alma Friendly, MD [facility] 09/19/2022, 6:44 PM   Available via Epic secure chat 7am-7pm After these hours, please refer to coverage provider listed on [email or web address]

## 2022-09-19 NOTE — Progress Notes (Signed)
Initial Nutrition Assessment  DOCUMENTATION CODES:   Obesity unspecified  INTERVENTION:  ProStat 30 ml TID (each 30 ml provides 100 kcal, 15 gr protein)   Glucerna Shake po TID, each supplement provides 220 kcal and 10 grams of protein   Nutrition education- Healthy Eating attached to AVS  NUTRITION DIAGNOSIS:   Inadequate oral intake related to poor appetite as evidenced by per patient/family report.   GOAL:  Patient will meet greater than or equal to 90% of their needs  MONITOR:  PO intake, Supplement acceptance, Labs  REASON FOR ASSESSMENT:   Consult Assessment of nutrition requirement/status  ASSESSMENT: Patient is a 77 yo female with hx of Multiple myeloma, CKD-3, HTN, DM2, anxiety/depression, recurrent falls, complaining of dizziness, poor oral intake.  Patient lives with one of her sons and the other one lives behind her. She reports grocery shopping, able to prepare meals and denies difficulty chewing/ swallowing. Her breakfast is here but she is not hungry. Patient ate 1/2 chicken wrap and doritos for dinner last night.  Despite reported poor appetite pt has experienced weight gain noted this month 4.8% < 30 days. Reports usual body wt of 168 lb (76.3 kg). No BLE edema noted.   Medications: Protonix, Crestor, Mag-ox, novolog  IV-NS'@75'$  ml/hr     Latest Ref Rng & Units 09/19/2022    4:29 AM 09/18/2022    4:18 AM 09/17/2022   12:11 PM  BMP  Glucose 70 - 99 mg/dL 158  193  308   BUN 8 - 23 mg/dL 20  30  35   Creatinine 0.44 - 1.00 mg/dL 1.27  1.64  2.20   Sodium 135 - 145 mmol/L 133  133  131   Potassium 3.5 - 5.1 mmol/L 3.6  3.4  4.4   Chloride 98 - 111 mmol/L 102  100  94   CO2 22 - 32 mmol/L '26  25  24   '$ Calcium 8.9 - 10.3 mg/dL 8.5  8.2  9.0       NUTRITION - FOCUSED PHYSICAL EXAM:  Flowsheet Row Most Recent Value  Orbital Region No depletion  Upper Arm Region No depletion  Thoracic and Lumbar Region No depletion  Buccal Region No depletion  Temple  Region No depletion  Clavicle Bone Region No depletion  Clavicle and Acromion Bone Region Mild depletion  Scapular Bone Region No depletion  Dorsal Hand No depletion  Patellar Region No depletion  Anterior Thigh Region No depletion  Posterior Calf Region Mild depletion  Edema (RD Assessment) None  Hair Reviewed  Eyes Reviewed  Mouth Reviewed  Skin Reviewed  Nails Reviewed       Diet Order:   Diet Order             Diet Carb Modified Fluid consistency: Thin; Room service appropriate? Yes  Diet effective now                   EDUCATION NEEDS:  Education needs have been addressed  Skin:  Skin Assessment: Reviewed RN Assessment  Last BM:  1/31  Height:   Ht Readings from Last 1 Encounters:  09/17/22 '5\' 3"'$  (1.6 m)    Weight:   Wt Readings from Last 1 Encounters:  09/19/22 81.5 kg    Ideal Body Weight:   52 kg  BMI:  Body mass index is 31.83 kg/m.  Estimated Nutritional Needs:   Kcal:  1700-1800  Protein:  85-90 gr  Fluid:  < 2 liters fluid daily  Jeani Hawking  Annetta Deiss MS,RD,CSG,LDN Contact: AMION

## 2022-09-19 NOTE — Progress Notes (Signed)
Patient refusing to have blood sugar checked this AM.

## 2022-09-19 NOTE — Progress Notes (Addendum)
Pt found on floor this morning during am rounds, pt reports that she had just got off commode and got dizzy, landed on floor.  No new injuries.  Responding to all questions appropriately.  Pt assisted to standing position and back to bed.  Noted that bed alarm had not sounded and pt had removed socks.  Socks applied, bed alarm on, fall mat in place.  Pt continues to set off bed alarm, declining needs when staff enters room.  Pt advised that for her safety, she needs to be assisted with toileting and needs to call for assistance.  Pt verbalizes understanding.  Provider notified, will continue to monitor.

## 2022-09-19 NOTE — Progress Notes (Signed)
Pt has kept room quiet and dark to improve dizziness.  Pt denies any dizziness while at rest, some with movement that resolves with rest.  No prns for pain at this time.  Rested in bed most of noc.  SR/ST on monitor.

## 2022-09-20 ENCOUNTER — Ambulatory Visit: Payer: 59

## 2022-09-20 ENCOUNTER — Ambulatory Visit: Payer: Medicare Other

## 2022-09-20 ENCOUNTER — Inpatient Hospital Stay (HOSPITAL_COMMUNITY): Payer: 59

## 2022-09-20 ENCOUNTER — Other Ambulatory Visit: Payer: Self-pay

## 2022-09-20 DIAGNOSIS — R262 Difficulty in walking, not elsewhere classified: Secondary | ICD-10-CM | POA: Diagnosis not present

## 2022-09-20 DIAGNOSIS — Z95828 Presence of other vascular implants and grafts: Secondary | ICD-10-CM

## 2022-09-20 DIAGNOSIS — R55 Syncope and collapse: Secondary | ICD-10-CM | POA: Diagnosis not present

## 2022-09-20 DIAGNOSIS — N179 Acute kidney failure, unspecified: Secondary | ICD-10-CM | POA: Diagnosis not present

## 2022-09-20 DIAGNOSIS — C9000 Multiple myeloma not having achieved remission: Secondary | ICD-10-CM

## 2022-09-20 LAB — CBC WITH DIFFERENTIAL/PLATELET
Abs Immature Granulocytes: 0.02 10*3/uL (ref 0.00–0.07)
Basophils Absolute: 0 10*3/uL (ref 0.0–0.1)
Basophils Relative: 0 %
Eosinophils Absolute: 0 10*3/uL (ref 0.0–0.5)
Eosinophils Relative: 1 %
HCT: 26.7 % — ABNORMAL LOW (ref 36.0–46.0)
Hemoglobin: 8.7 g/dL — ABNORMAL LOW (ref 12.0–15.0)
Immature Granulocytes: 0 %
Lymphocytes Relative: 6 %
Lymphs Abs: 0.3 10*3/uL — ABNORMAL LOW (ref 0.7–4.0)
MCH: 33.3 pg (ref 26.0–34.0)
MCHC: 32.6 g/dL (ref 30.0–36.0)
MCV: 102.3 fL — ABNORMAL HIGH (ref 80.0–100.0)
Monocytes Absolute: 0.5 10*3/uL (ref 0.1–1.0)
Monocytes Relative: 10 %
Neutro Abs: 3.9 10*3/uL (ref 1.7–7.7)
Neutrophils Relative %: 83 %
Platelets: 72 10*3/uL — ABNORMAL LOW (ref 150–400)
RBC: 2.61 MIL/uL — ABNORMAL LOW (ref 3.87–5.11)
RDW: 14.3 % (ref 11.5–15.5)
WBC: 4.7 10*3/uL (ref 4.0–10.5)
nRBC: 0 % (ref 0.0–0.2)

## 2022-09-20 LAB — BASIC METABOLIC PANEL
Anion gap: 8 (ref 5–15)
BUN: 15 mg/dL (ref 8–23)
CO2: 25 mmol/L (ref 22–32)
Calcium: 8.4 mg/dL — ABNORMAL LOW (ref 8.9–10.3)
Chloride: 98 mmol/L (ref 98–111)
Creatinine, Ser: 1.2 mg/dL — ABNORMAL HIGH (ref 0.44–1.00)
GFR, Estimated: 48 mL/min — ABNORMAL LOW (ref >60)
Glucose, Bld: 144 mg/dL — ABNORMAL HIGH (ref 70–99)
Potassium: 3.3 mmol/L — ABNORMAL LOW (ref 3.5–5.1)
Sodium: 131 mmol/L — ABNORMAL LOW (ref 135–145)

## 2022-09-20 LAB — GLUCOSE, CAPILLARY
Glucose-Capillary: 156 mg/dL — ABNORMAL HIGH (ref 70–99)
Glucose-Capillary: 137 mg/dL — ABNORMAL HIGH (ref 70–99)
Glucose-Capillary: 86 mg/dL (ref 70–99)
Glucose-Capillary: 87 mg/dL (ref 70–99)

## 2022-09-20 MED ORDER — MIDODRINE HCL 5 MG PO TABS
2.5000 mg | ORAL_TABLET | Freq: Three times a day (TID) | ORAL | Status: DC
  Administered 2022-09-20 – 2022-09-21 (×4): 2.5 mg via ORAL
  Filled 2022-09-20 (×4): qty 1

## 2022-09-20 MED ORDER — GADOBUTROL 1 MMOL/ML IV SOLN
9.0000 mL | Freq: Once | INTRAVENOUS | Status: AC | PRN
  Administered 2022-09-20: 9 mL via INTRAVENOUS

## 2022-09-20 MED ORDER — ONDANSETRON HCL 4 MG/2ML IJ SOLN
4.0000 mg | Freq: Four times a day (QID) | INTRAMUSCULAR | Status: DC | PRN
  Administered 2022-09-20 – 2022-09-21 (×2): 4 mg via INTRAVENOUS
  Filled 2022-09-20 (×2): qty 2

## 2022-09-20 NOTE — Care Management Important Message (Signed)
Important Message  Patient Details  Name: Alexandra Price MRN: [id number] Date of Birth: 1946-07-28   Medicare Important Message Given:  N/A - LOS <3 / Initial given by admissions     Tommy Medal 09/20/2022, 11:27 AM

## 2022-09-20 NOTE — TOC Progression Note (Signed)
Transition of Care [facility]) - Progression Note    Patient Details  Name: Alexandra Price MRN: [id number] Date of Birth: 08/12/1946  Transition of [facility] CM/SW Contact  Ihor Gully, LCSW Phone Number: 09/20/2022, 12:59 PM  Clinical Narrative:    Authorization received: approved 2/1 - 2/3, next review 2/3, Candace Cruise id [id number]   Expected Discharge Plan: [place] Barriers to Discharge: Continued Medical Work up  Expected Discharge Plan and Services In-house Referral: Clinical Social Work   Post Acute Care Choice: [place] Living arrangements for the past 2 months: [place] Determinants of Health (SDOH) Interventions [place]: No Food Insecurity (09/17/2022)  Housing: Low Risk  (09/17/2022)  Transportation Needs: No Transportation Needs (09/17/2022)  Utilities: Not At Risk (09/17/2022)  Alcohol Screen: Low Risk  (07/06/2020)  Depression (PHQ2-9): Low Risk  (07/06/2020)  Financial Resource Strain: Low Risk  (07/06/2020)  Physical Activity: Inactive (07/06/2020)  Social Connections: Moderately Isolated (07/06/2020)  Stress: No Stress Concern Present (07/06/2020)  Tobacco Use: Low Risk  (09/17/2022)    Readmission Risk Interventions    08/16/2022    1:48 PM  Readmission Risk Prevention Plan  Transportation Screening Complete  HRI or Home Care Consult Complete  Social Work Consult for [place] Planning/Counseling Complete  Palliative Care Screening Not Applicable  Medication Review Press photographer) Complete

## 2022-09-20 NOTE — Progress Notes (Signed)
Pt tolerated orthostatic vs well, did not complain of any dizziness despite dip in b\p.

## 2022-09-20 NOTE — Progress Notes (Signed)
PROGRESS NOTE    Alexandra Price  GQQ:[id number] DOB: [birth year] DOA: 09/17/2022 PCP: [name], MD     Brief Narrative:  Alexandra Price is a 77 y.o. female with medical history significant of CKD, MM, HTN, depression, CKD stage 3b, hx of PE on eliquis, T2DM who presented to ED after falling 5 times over the past 5 days. She will have dizziness before each episode.  She can not for sure say if she has lost consciousness, because she can't remember anything.  She states she will be dizzy after being up for a little bit right before she falls. She has dizziness with postural change too.  In the emergency department, she was found to have orthostatic hypotension.  She was given IV fluid bolus and admitted to the hospital.   New events last 24 hours / Subjective: Patient noted to have a fall earlier this morning while ambulating from the bathroom back to her bed, did not ask for any assistance.  Advised patient to always call for help before getting out of bed due to orthostatic hypotension.  Patient complained of some dysuria.  Noted to be pretty sleepy, but was able to engage in conversation.    Assessment & Plan:  Principal Problem:   Near syncope with dizziness and frequent falls Active Problems:   Acute renal failure superimposed on stage 3b chronic kidney disease (HCC)   Ambulatory dysfunction   Type 2 diabetes mellitus without complication (HCC)   Macrocytic anemia   Thrombocytopenia (HCC)   Multiple myeloma not having achieved remission (HCC)   Pulmonary embolism (HCC)   Mixed anxiety and depressive disorder   Chronic low back pain   Hyperlipidemia   Nasal fracture   Orthostatic hypotension with near syncope Fall Orthostatic vital signs remains positive Continue IV fluid, TED hose, start midodrine Echocardiogram with a EF of 55 to 60%, no regional wall motion abnormalities, grade 1 diastolic dysfunction PT recommending SNF placement  ?UTI Patient reported  dysuria Initial UA showed large leukocytes, rare bacteria, greater than 50 WBC Urine culture grew >100, 000 diphtheroids Will discuss with ID for the need to treat  Hypokalemia Replace as needed  Poor PO intake Dietitian consulted  AKI on CKD stage IIIb Baseline creatinine 1.4 Improved with IV fluid  Diabetes mellitus type 2 Not on any medication as outpatient Sliding scale insulin, semglee  Multiple myeloma Followed by Dr. Delton Coombes Last daratumumab dose on 09/13/2022  MRI thoracic and lumbar spine showed myelomatous lesions  Anemia of chronic disease Hemoglobin around baseline Denies any signs of bleeding Anemia panel showed iron 29, sats 10, ferritin >1000 Daily CBC  PE Eliquis  Mood disorder Lexapro, xanax prn   Chronic back pain Norco prn   HLD Crestor   Nasal fracture Follow up ENT outpatient     DVT prophylaxis:  Place TED hose Start: 09/19/22 1848 apixaban (ELIQUIS) tablet 2.5 mg Start: 09/17/22 2200 Place TED hose Start: 09/17/22 1808 apixaban (ELIQUIS) tablet 2.5 mg  Code Status: DNR Family Communication: None at bedside  Disposition Plan:  Status is: Inpatient    Consultants:  EDP spoke with Oncology   Procedures:  None   Antimicrobials:  Anti-infectives (From admission, onward)    Start     Dose/Rate Route Frequency Ordered Stop   09/17/22 2200  acyclovir (ZOVIRAX) tablet 400 mg        400 mg Oral 2 times daily 09/17/22 1802          Objective: Vitals:  09/19/22 0500 09/19/22 2015 09/20/22 0318 09/20/22 0500  BP:  138/81 (!) 153/92   Pulse:  84 97   Resp:  18 18   Temp:  98.2 F (36.8 C) 98.3 F (36.8 C)   TempSrc:  Oral Oral   SpO2:  95% 97%   Weight: 81.5 kg   78.1 kg  Height:        Intake/Output Summary (Last 24 hours) at 09/20/2022 1549 Last data filed at 09/20/2022 1300 Gross per 24 hour  Intake 480 ml  Output 600 ml  Net -120 ml   Filed Weights   09/18/22 0500 09/19/22 0500 09/20/22 0500  Weight: 77 kg  81.5 kg 78.1 kg    Examination:  General: NAD, appears weak  Cardiovascular: S1, S2 present Respiratory: CTAB Abdomen: Soft, nontender, nondistended, bowel sounds present Musculoskeletal: No bilateral pedal edema noted Skin: Normal Psychiatry: Normal mood    Data Reviewed: I have personally reviewed following labs and imaging studies  CBC: Recent Labs  Lab 09/14/22 1225 09/17/22 1211 09/18/22 0418 09/19/22 0429 09/20/22 0419  WBC 4.8 8.1 4.8 4.2 4.7  NEUTROABS 3.8 7.2  --   --  3.9  HGB 10.0* 10.9* 8.3* 8.3* 8.7*  HCT 31.0* 33.0* 25.7* 25.7* 26.7*  MCV 102.3* 102.2* 102.8* 102.8* 102.3*  PLT 80* 55* 46* 56* 72*   Basic Metabolic Panel: Recent Labs  Lab 09/14/22 1225 09/17/22 1211 09/17/22 1619 09/18/22 0418 09/19/22 0429 09/20/22 0419  NA 134* 131*  --  133* 133* 131*  K 3.6 4.4  --  3.4* 3.6 3.3*  CL 96* 94*  --  100 102 98  CO2 27 24  --  '25 26 25  '$ GLUCOSE 180* 308*  --  193* 158* 144*  BUN 32* 35*  --  30* 20 15  CREATININE 1.90* 2.20*  --  1.64* 1.27* 1.20*  CALCIUM 9.0 9.0  --  8.2* 8.5* 8.4*  MG  --   --  1.9  --  1.8  --    GFR: Estimated Creatinine Clearance: 40.7 mL/min (A) (by C-G formula based on SCr of 1.2 mg/dL (H)). Liver Function Tests: Recent Labs  Lab 09/14/22 1225  AST 20  ALT 22  ALKPHOS 64  BILITOT 0.5  PROT 6.5  ALBUMIN 3.7   Recent Labs  Lab 09/14/22 1225  LIPASE 46   No results for input(s): "AMMONIA" in the last 168 hours. Coagulation Profile: Recent Labs  Lab 09/17/22 1619  INR 1.1   Cardiac Enzymes: Recent Labs  Lab 09/17/22 1619  CKTOTAL 29*   BNP (last 3 results) No results for input(s): "PROBNP" in the last 8760 hours. HbA1C: No results for input(s): "HGBA1C" in the last 72 hours. CBG: Recent Labs  Lab 09/19/22 1134 09/19/22 1624 09/19/22 2017 09/20/22 0712 09/20/22 1134  GLUCAP 164* 132* 151* 156* 137*   Lipid Profile: No results for input(s): "CHOL", "HDL", "LDLCALC", "TRIG", "CHOLHDL",  "LDLDIRECT" in the last 72 hours. Thyroid Function Tests: Recent Labs    09/17/22 1619  TSH 0.956   Anemia Panel: Recent Labs    09/17/22 1619  VITAMINB12 467  FERRITIN 1,059*  TIBC 290  IRON 29   Sepsis Labs: Recent Labs  Lab 09/14/22 1359  LATICACIDVEN 0.9    Recent Results (from the past 240 hour(s))  Urine Culture (for pregnant, neutropenic or urologic patients or patients with an indwelling urinary catheter)     Status: Abnormal   Collection Time: 09/17/22  4:28 PM   Specimen:  Urine, Clean Catch  Result Value Ref Range Status   Specimen Description   Final    URINE, CLEAN CATCH Performed at [facility], [address] [place], [place] [address]    Special Requests   Final    Immunocompromised Performed at [facility], [address], [place], [place] [address]    Culture (A)  Final    >=100,000 COLONIES/mL DIPHTHEROIDS(CORYNEBACTERIUM SPECIES) Standardized susceptibility testing for this organism is not available. Performed at [facility], [place] [address], [place],  [address]    Report Status 09/18/2022 FINAL  Final      Radiology Studies: MR THORACIC SPINE W WO CONTRAST  Result Date: 09/20/2022 CLINICAL DATA:  Multiple myeloma.  Assess for Mets. EXAM: MRI THORACIC AND LUMBAR SPINE WITHOUT AND WITH CONTRAST TECHNIQUE: Multiplanar and multiecho pulse sequences of the thoracic and lumbar spine were obtained without and with intravenous contrast. CONTRAST:  33m GADAVIST GADOBUTROL 1 MMOL/ML IV SOLN COMPARISON:  PET CT10/5/23, CT C SPine 09/17/22 FINDINGS: MRI THORACIC SPINE FINDINGS Alignment:  Mildly exaggerated thoracic kyphosis. Vertebrae: No evidence of an acute compression deformity. Vertebral body heights are preserved. There are mild degenerative endplate changes. In the visualized cervical spine there is a T2 hyperintense lesion along the superior endplate of C7 (series 21, image 11), which is favored to represent a myelomatous lesion. -there is  a contrast-enhancing lesion in the posterior elements of T11 (series 25, image 12), favored to represent a myelomatous lesion. Cord:  Normal signal and morphology. Paraspinal and other soft tissues: Negative Disc levels: There are small disc bulges at the T10-T11 and T12-L1 levels resulting in mild spinal canal stenosis. No evidence of high-grade spinal canal stenosis. No evidence of high-grade neural foraminal stenosis. MRI LUMBAR SPINE FINDINGS Segmentation:  Standard. Alignment:  Physiologic. Vertebrae: Mild mid vertebral body height loss at L4 is favored to be degenerative. No evidence of discitis or osteomyelitis. There is a small punctate focal contrast-enhancing lesion along the inferior endplate of L1 which could be degenerative or represent a small myelomatous lesion. Conus medullaris: Extends to the L1 level and appears normal. Paraspinal and other soft tissues: There is atrophy of the paraspinal musculature bilaterally. The urinary bladder is markedly distended. Disc levels: There is multilevel degenerative disc disease with a right paracentral disc protrusion at T12-L1, a circumferential disc bulge at L1-L2, L2-L3, L3-L4, and L4-L5. The disc bulges results in mild spinal canal narrowing. There is no evidence of high-grade neural foraminal stenosis. IMPRESSION: 1. Small contrast-enhancing lesions in the posterior elements of T11 and possibly along the superior endplate of C7 are favored to represent myelomatous lesions. 2. Punctate contrast-enhancing lesion along the inferior endplate of L1 could be degenerative or represent a small myelomatous lesion. 3. Multilevel degenerative changes in the thoracolumbar spine without evidence of high grade spinal canal or neuroforaminal stenosis. Electronically Signed   By: HMarin RobertsM.D.   On: 09/20/2022 09:29   MR Lumbar Spine W Wo Contrast  Result Date: 09/20/2022 CLINICAL DATA:  Multiple myeloma.  Assess for Mets. EXAM: MRI THORACIC AND LUMBAR SPINE WITHOUT  AND WITH CONTRAST TECHNIQUE: Multiplanar and multiecho pulse sequences of the thoracic and lumbar spine were obtained without and with intravenous contrast. CONTRAST:  931mGADAVIST GADOBUTROL 1 MMOL/ML IV SOLN COMPARISON:  PET CT10/5/23, CT C SPine 09/17/22 FINDINGS: MRI THORACIC SPINE FINDINGS Alignment:  Mildly exaggerated thoracic kyphosis. Vertebrae: No evidence of an acute compression deformity. Vertebral body heights are preserved. There are mild degenerative endplate changes.  In the visualized cervical spine there is a T2 hyperintense lesion along the superior endplate of C7 (series 21, image 11), which is favored to represent a myelomatous lesion. -there is a contrast-enhancing lesion in the posterior elements of T11 (series 25, image 12), favored to represent a myelomatous lesion. Cord:  Normal signal and morphology. Paraspinal and other soft tissues: Negative Disc levels: There are small disc bulges at the T10-T11 and T12-L1 levels resulting in mild spinal canal stenosis. No evidence of high-grade spinal canal stenosis. No evidence of high-grade neural foraminal stenosis. MRI LUMBAR SPINE FINDINGS Segmentation:  Standard. Alignment:  Physiologic. Vertebrae: Mild mid vertebral body height loss at L4 is favored to be degenerative. No evidence of discitis or osteomyelitis. There is a small punctate focal contrast-enhancing lesion along the inferior endplate of L1 which could be degenerative or represent a small myelomatous lesion. Conus medullaris: Extends to the L1 level and appears normal. Paraspinal and other soft tissues: There is atrophy of the paraspinal musculature bilaterally. The urinary bladder is markedly distended. Disc levels: There is multilevel degenerative disc disease with a right paracentral disc protrusion at T12-L1, a circumferential disc bulge at L1-L2, L2-L3, L3-L4, and L4-L5. The disc bulges results in mild spinal canal narrowing. There is no evidence of high-grade neural foraminal  stenosis. IMPRESSION: 1. Small contrast-enhancing lesions in the posterior elements of T11 and possibly along the superior endplate of C7 are favored to represent myelomatous lesions. 2. Punctate contrast-enhancing lesion along the inferior endplate of L1 could be degenerative or represent a small myelomatous lesion. 3. Multilevel degenerative changes in the thoracolumbar spine without evidence of high grade spinal canal or neuroforaminal stenosis. Electronically Signed   By: Marin Roberts M.D.   On: 09/20/2022 09:29      Scheduled Meds:  (feeding supplement) PROSource Plus  30 mL Oral TID WC   acyclovir  400 mg Oral BID   apixaban  2.5 mg Oral BID   Chlorhexidine Gluconate Cloth  6 each Topical Daily   escitalopram  20 mg Oral Daily   feeding supplement (GLUCERNA SHAKE)  237 mL Oral TID BM   insulin aspart  0-9 Units Subcutaneous TID WC   insulin glargine-yfgn  8 Units Subcutaneous Daily   magnesium oxide  400 mg Oral BID   midodrine  2.5 mg Oral TID WC   pantoprazole  40 mg Oral Daily   rosuvastatin  10 mg Oral Daily   sodium chloride flush  3 mL Intravenous Q12H   Continuous Infusions:  sodium chloride 75 mL/hr at 09/19/22 2113     LOS: 2 days   Time spent: 35 minutes   Alma Friendly, MD [facility] 09/20/2022, 3:49 PM   Available via Epic secure chat 7am-7pm After these hours, please refer to coverage provider listed on [email or web address]

## 2022-09-20 NOTE — Progress Notes (Signed)
Physical Therapy Treatment Patient Details Name: Alexandra Price MRN: [id number] DOB: January 20, 1946 Today's Date: 09/20/2022   History of Present Illness Alexandra Price is a 77 y.o. female with medical history significant of CKD, MM, HTN, depression, CKD stage 3b, hx of PE on eliquis, T2DM who presented to ED after falling 5 times over the past 5 days. She will have dizziness before each episode.  She can not for sure say if she has lost consciousness, because she can't remember anything christmas day an feels like her memory is poor.  She states she will be dizzy after being up for a little bit right before she falls. She has dizziness with postural change too.  She states she has been eating and drinking fine.  She is weaker and feels like she has been on a decline for the past few weeks. She has tingling in the backs of her legs to the knee, but no loss of sensation. No urinary incontinence, no saddle paraesthesias. She also complains of urine color change, odor and dysuria.    PT Comments    Pt agreeable to participation with therapy today.  Completed exercises both supine and sitting edge of bed. Pt ambulated to bathroom to void prior to ambulation.  Pt able to complete all hygiene independently with supervision only. Ambulated increased distance of 200 feet using RW.  No dizziness or LOB with ambulation.   PT with slow mobility allowing adjustments to orthostatic changes. Assisted with doffing/donning depend in standing with patient using RW for UE support.   Pt able to return to supine with cues only.  Some back pain reported during session.     Recommendations for follow up therapy are one component of a multi-disciplinary discharge planning process, led by the attending physician.  Recommendations may be updated based on patient status, additional functional criteria and insurance authorization.  Follow Up Recommendations  Skilled nursing-short term rehab (<3 hours/day) Can patient physically  be transported by private vehicle: Yes   Assistance Recommended at Discharge    Patient can return home with the following A little help with walking and/or transfers;A little help with bathing/dressing/bathroom;Assistance with cooking/housework;Assist for transportation;Help with stairs or ramp for entrance   Equipment Recommendations  None recommended by PT    Recommendations for Other Services       Precautions / Restrictions Precautions Precautions: Fall Restrictions Weight Bearing Restrictions: No     Mobility  Bed Mobility Overal bed mobility: Modified Independent             General bed mobility comments: increased time but no assistance needed    Transfers Overall transfer level: Modified independent Equipment used: Rolling walker (2 wheels) Transfers: Sit to/from Stand, Bed to chair/wheelchair/BSC Sit to Stand: Min guard, Supervision   Step pivot transfers: Min guard, Supervision       General transfer comment: Pt able to transfer from toilet and bed today with supervision only    Ambulation/Gait Ambulation/Gait assistance: Min guard Gait Distance (Feet): 200 Feet Assistive device: Rolling walker (2 wheels) Gait Pattern/deviations: Decreased step length - right, Decreased step length - left   Gait velocity interpretation: <1.31 ft/sec, indicative of household ambulator   General Gait Details: slow cautious gait, no LOB or reports of dizziness   Stairs             Wheelchair Mobility    Modified Rankin (Stroke Patients Only)       Balance Overall balance assessment: Needs assistance  Standing balance support: Bilateral upper extremity supported Standing balance-Leahy Scale: Fair Standing balance comment: single leg stance as therapist assisted with donning of depend.  pt must use bil UE to maintain balance                            Cognition Arousal/Alertness: Awake/alert Behavior During Therapy: WFL for  tasks assessed/performed Overall Cognitive Status: Within Functional Limits for tasks assessed                                          Exercises General Exercises - Lower Extremity Ankle Circles/Pumps: AROM, Both, 10 reps Gluteal Sets: AROM, Both, 10 reps Long Arc Quad: AROM, Both, 10 reps Heel Slides: AROM, Both, 10 reps Hip ABduction/ADduction: AROM, Both, 10 reps Straight Leg Raises: AROM, Both, 10 reps      PT Goals (current goals can now be found in the care plan section)      Frequency    Min 3X/week      PT Plan Current plan remains appropriate       AM-PAC PT "6 Clicks" Mobility   Outcome Measure  Help needed turning from your back to your side while in a flat bed without using bedrails?: None Help needed moving from lying on your back to sitting on the side of a flat bed without using bedrails?: None Help needed moving to and from a bed to a chair (including a wheelchair)?: A Little Help needed standing up from a chair using your arms (e.g., wheelchair or bedside chair)?: A Little Help needed to walk in hospital room?: A Little Help needed climbing 3-5 steps with a railing? : A Lot 6 Click Score: 19    End of Session Equipment Utilized During Treatment: Gait belt Activity Tolerance: Patient tolerated treatment well;Patient limited by pain Patient left: with call bell/phone within reach;in bed Nurse Communication: Mobility status PT Visit Diagnosis: Unsteadiness on feet (R26.81);Other abnormalities of gait and mobility (R26.89);Muscle weakness (generalized) (M62.81);Repeated falls (R29.6)     Time: 1015-1040 PT Time Calculation (min) (ACUTE ONLY): 25 min  Charges:  $Gait Training: 8-22 mins $Therapeutic Exercise: 8-22 mins                     Teena Irani, PTA/CLT [facility] Ph: [place], [place] 09/20/2022, 10:50 AM

## 2022-09-21 ENCOUNTER — Other Ambulatory Visit: Payer: Self-pay | Admitting: Internal Medicine

## 2022-09-21 DIAGNOSIS — C9000 Multiple myeloma not having achieved remission: Secondary | ICD-10-CM | POA: Diagnosis not present

## 2022-09-21 DIAGNOSIS — F419 Anxiety disorder, unspecified: Secondary | ICD-10-CM

## 2022-09-21 DIAGNOSIS — R262 Difficulty in walking, not elsewhere classified: Secondary | ICD-10-CM | POA: Diagnosis not present

## 2022-09-21 DIAGNOSIS — R55 Syncope and collapse: Secondary | ICD-10-CM | POA: Diagnosis not present

## 2022-09-21 DIAGNOSIS — I951 Orthostatic hypotension: Secondary | ICD-10-CM | POA: Diagnosis not present

## 2022-09-21 LAB — CBC WITH DIFFERENTIAL/PLATELET
Abs Immature Granulocytes: 0.01 10*3/uL (ref 0.00–0.07)
Basophils Absolute: 0 10*3/uL (ref 0.0–0.1)
Basophils Relative: 1 %
Eosinophils Absolute: 0.1 10*3/uL (ref 0.0–0.5)
Eosinophils Relative: 2 %
HCT: 28 % — ABNORMAL LOW (ref 36.0–46.0)
Hemoglobin: 9.1 g/dL — ABNORMAL LOW (ref 12.0–15.0)
Immature Granulocytes: 0 %
Lymphocytes Relative: 6 %
Lymphs Abs: 0.3 10*3/uL — ABNORMAL LOW (ref 0.7–4.0)
MCH: 33.6 pg (ref 26.0–34.0)
MCHC: 32.5 g/dL (ref 30.0–36.0)
MCV: 103.3 fL — ABNORMAL HIGH (ref 80.0–100.0)
Monocytes Absolute: 0.5 10*3/uL (ref 0.1–1.0)
Monocytes Relative: 12 %
Neutro Abs: 3.2 10*3/uL (ref 1.7–7.7)
Neutrophils Relative %: 79 %
Platelets: 91 10*3/uL — ABNORMAL LOW (ref 150–400)
RBC: 2.71 MIL/uL — ABNORMAL LOW (ref 3.87–5.11)
RDW: 14.6 % (ref 11.5–15.5)
WBC: 4.1 10*3/uL (ref 4.0–10.5)
nRBC: 0 % (ref 0.0–0.2)

## 2022-09-21 LAB — BASIC METABOLIC PANEL
Anion gap: 4 — ABNORMAL LOW (ref 5–15)
BUN: 19 mg/dL (ref 8–23)
CO2: 26 mmol/L (ref 22–32)
Calcium: 8.5 mg/dL — ABNORMAL LOW (ref 8.9–10.3)
Chloride: 101 mmol/L (ref 98–111)
Creatinine, Ser: 1.28 mg/dL — ABNORMAL HIGH (ref 0.44–1.00)
GFR, Estimated: 44 mL/min — ABNORMAL LOW (ref >60)
Glucose, Bld: 137 mg/dL — ABNORMAL HIGH (ref 70–99)
Potassium: 4 mmol/L (ref 3.5–5.1)
Sodium: 131 mmol/L — ABNORMAL LOW (ref 135–145)

## 2022-09-21 LAB — GLUCOSE, CAPILLARY
Glucose-Capillary: 142 mg/dL — ABNORMAL HIGH (ref 70–99)
Glucose-Capillary: 127 mg/dL — ABNORMAL HIGH (ref 70–99)

## 2022-09-21 MED ORDER — HEPARIN SOD (PORK) LOCK FLUSH 100 UNIT/ML IV SOLN
500.0000 [IU] | Freq: Once | INTRAVENOUS | Status: AC
  Administered 2022-09-21: 500 [IU] via INTRAVENOUS
  Filled 2022-09-21: qty 5

## 2022-09-21 MED ORDER — ALPRAZOLAM 0.5 MG PO TABS: ORAL_TABLET | ORAL | 0 refills | Status: DC

## 2022-09-21 MED ORDER — HYDROCODONE-ACETAMINOPHEN 10-325 MG PO TABS: 1.0000 | ORAL_TABLET | Freq: Four times a day (QID) | ORAL | 0 refills | Status: DC | PRN

## 2022-09-21 MED ORDER — METFORMIN HCL 500 MG PO TABS: 500.0000 mg | ORAL_TABLET | Freq: Every day | ORAL | Status: DC

## 2022-09-21 MED ORDER — MIDODRINE HCL 2.5 MG PO TABS: 2.5000 mg | ORAL_TABLET | Freq: Three times a day (TID) | ORAL | Status: DC

## 2022-09-21 NOTE — Progress Notes (Signed)
Nsg Discharge Note  Admit Date:  09/17/2022 Discharge date: 09/21/2022   Alexandra Price to be D/C'd Home per MD order.  AVS completed.  Patient/caregiver able to verbalize understanding.  Discharge Medication: Allergies as of 09/21/2022       Reactions   Ciprofloxacin Anaphylaxis   Amoxicillin Other (See Comments)   unknown   Morphine And Related Nausea And Vomiting   Augmentin [amoxicillin-pot Clavulanate] Other (See Comments)   Headache, insomnia        Medication List     TAKE these medications    acetaminophen 325 MG tablet Commonly known as: TYLENOL Take 650 mg by mouth as needed for moderate pain (prior to infusion).   acyclovir 400 MG tablet Commonly known as: ZOVIRAX Take 1 tablet (400 mg total) by mouth 2 (two) times daily.   albuterol 108 (90 Base) MCG/ACT inhaler Commonly known as: Ventolin HFA Inhale 2 puffs into the lungs every 4 (four) hours as needed for wheezing or shortness of breath.   albuterol (2.5 MG/3ML) 0.083% nebulizer solution Commonly known as: PROVENTIL Take 3 mLs (2.5 mg total) by nebulization every 4 (four) hours as needed for wheezing or shortness of breath.   ALPRAZolam 0.5 MG tablet Commonly known as: XANAX TAKE 1 TABLET BY MOUTH DURING THE DAY AS NEEDED FOR ANXIETY AND 2 TABLETS AT BEDTIME AS NEEDED FOR ANXIETY/INSOMNIA   CALCIUM 1000 + D PO Take 1,000 mg by mouth daily.   Compressor/Nebulizer Misc 1 Units by Does not apply route 3 (three) times daily as needed.   dexAMETHasone 20 MG Tabs Take 1 tablet by mouth as directed. Take 1 tablet 30 minutes prior to infusions   dextromethorphan-guaiFENesin 30-600 MG 12hr tablet Commonly known as: MUCINEX DM Take 1 tablet by mouth 2 (two) times daily.   diphenhydrAMINE 25 MG tablet Commonly known as: BENADRYL Take 50 mg by mouth as needed (prior to infusion).   Eliquis 2.5 MG Tabs tablet Generic drug: apixaban Take 2.5 mg by mouth 2 (two) times daily.   escitalopram 20 MG  tablet Commonly known as: LEXAPRO Take 20 mg by mouth daily.   HYDROcodone-acetaminophen 10-325 MG tablet Commonly known as: NORCO Take 1 tablet by mouth every 6 (six) hours as needed for up to 7 days.   magnesium oxide 400 (240 Mg) MG tablet Commonly known as: MAG-OX Take 1 tablet (400 mg total) by mouth 2 (two) times daily.   metFORMIN 500 MG tablet Commonly known as: GLUCOPHAGE Take 1 tablet (500 mg total) by mouth daily with breakfast.   methocarbamol 500 MG tablet Commonly known as: ROBAXIN Take 1 tablet (500 mg total) by mouth every 8 (eight) hours as needed for muscle spasms (back pain).   midodrine 2.5 MG tablet Commonly known as: PROAMATINE Take 1 tablet (2.5 mg total) by mouth 3 (three) times daily with meals.   montelukast 10 MG tablet Commonly known as: SINGULAIR TAKE 1 TABLET BY MOUTH AS DIRECTED 30 MINUTES PRIOR TO TREATMENT What changed: See the new instructions.   Multi Vitamin Tabs Take 1 tablet by mouth daily.   pantoprazole 40 MG tablet Commonly known as: PROTONIX Take 1 tablet (40 mg total) by mouth daily.   polyethylene glycol 17 g packet Commonly known as: MIRALAX / GLYCOLAX Take 17 g by mouth daily as needed for moderate constipation.   prochlorperazine 10 MG tablet Commonly known as: COMPAZINE Take 1 tablet (10 mg total) by mouth every 6 (six) hours as needed for nausea or vomiting.  rosuvastatin 10 MG tablet Commonly known as: CRESTOR Take 10 mg by mouth daily.   zolpidem 5 MG tablet Commonly known as: AMBIEN Take 2 tablets (10 mg total) by mouth at bedtime as needed for sleep.        Discharge Assessment: Vitals:   09/21/22 0600 09/21/22 0646  BP: (!) 104/50 (!) 151/88  Pulse: 73 90  Resp: 20 20  Temp: 97.9 F (36.6 C) 98.3 F (36.8 C)  SpO2: 98% 95%   Skin clean, dry and intact without evidence of skin break down, no evidence of skin tears noted. IV catheter discontinued intact. Site without signs and symptoms of  complications - no redness or edema noted at insertion site, patient denies c/o pain - only slight tenderness at site.  Dressing with slight pressure applied.  D/c Instructions-Education: Discharge instructions given to patient/family with verbalized understanding. D/c education completed with patient/family including follow up instructions, medication list, d/c activities limitations if indicated, with other d/c instructions as indicated by MD - patient able to verbalize understanding, all questions fully answered. Patient instructed to return to ED, call 911, or call MD for any changes in condition.  Patient escorted via [place], and D/C home via private auto.  Kathie Rhodes, RN 09/21/2022 12:02 PM

## 2022-09-21 NOTE — Discharge Summary (Signed)
Physician Discharge Summary   Patient: Alexandra Price MRN: [id number] DOB: [birth year]  Admit date:     09/17/2022  Discharge date: 09/21/22  Discharge Physician: Alma Friendly   PCP: [name], MD   Recommendations at discharge:   Follow-up with PCP in 1 week  Discharge Diagnoses: Principal Problem:   Near syncope with dizziness and frequent falls Active Problems:   Acute renal failure superimposed on stage 3b chronic kidney disease (HCC)   Ambulatory dysfunction   Type 2 diabetes mellitus without complication (HCC)   Macrocytic anemia   Thrombocytopenia (HCC)   Multiple myeloma not having achieved remission (HCC)   Pulmonary embolism (HCC)   Mixed anxiety and depressive disorder   Chronic low back pain   Hyperlipidemia   Nasal fracture    Hospital Course: Alexandra Price is a 76 y.o. female with medical history significant of CKD, MM, HTN, depression, CKD stage 3b, hx of PE on eliquis, T2DM who presented to ED after falling 5 times over the past 5 days. She will have dizziness before each episode.  She can not for sure say if she has lost consciousness, because she can't remember anything.  She states she will be dizzy after being up for a little bit right before she falls. She has dizziness with postural change too.  In the emergency department, she was found to have orthostatic hypotension.  She was given IV fluid bolus and admitted to the hospital.    Today, patient denies any new complaints, dizziness has improved.  Patient still noted to be orthostatic, but denies any dizziness. Patient advised to be compliant with her TED stockings.     Assessment and Plan:  Orthostatic hypotension with near syncope Fall Orthostatic vital signs remains positive, but symptoms have improved S/p IV fluid, TED hose, started on low dose midodrine, abdominal binder Midodrine could be adjusted if patient continues to be orthostatic, monitor BP carefully Echocardiogram with a  EF of 55 to 60%, no regional wall motion abnormalities, grade 1 diastolic dysfunction PT recommending SNF placement Follow-up with PCP   ?UTI Patient denies any dysuria Initial UA showed large leukocytes, rare bacteria, greater than 50 WBC Urine culture grew >100, 000 diphtheroids Discussed with ID, Dr. Graylon Good on 09/20/22, no need to treat if patient is completely asymptomatic Monitor closely as patient is immunosuppressed Follow-up with PCP   Hypokalemia Replaced as needed   Poor PO intake Supplements recommended   AKI on CKD stage IIIb Baseline creatinine 1.4 Improved with IV fluid, advised to stay hydrated   Diabetes mellitus type 2 Last A1c 8.7 on 08/15/2022 Not on any medication as outpatient While in the hospital, patient was placed on sliding scale insulin, semglee Discharge patient on p.o. metformin 500 mg daily PCP to follow-up   Multiple myeloma Followed by Dr. Delton Coombes Last daratumumab dose on 09/13/2022  MRI thoracic and lumbar spine showed myelomatous lesions, no further workup required   Anemia of chronic disease Hemoglobin around baseline Denies any signs of bleeding Anemia panel showed iron 29, sats 10, ferritin >1000   PE Eliquis   Mood disorder Lexapro, xanax prn    Chronic back pain Norco prn    HLD Crestor    Nasal fracture Follow up ENT outpatient   Obesity Lifestyle modification advised        Consultants: None Procedures performed: None Disposition: Skilled nursing facility Diet recommendation:  Cardiac and Carb modified diet   DISCHARGE MEDICATION: Allergies as of 09/21/2022  Reactions   Ciprofloxacin Anaphylaxis   Amoxicillin Other (See Comments)   unknown   Morphine And Related Nausea And Vomiting   Augmentin [amoxicillin-pot Clavulanate] Other (See Comments)   Headache, insomnia        Medication List     TAKE these medications    acetaminophen 325 MG tablet Commonly known as: TYLENOL Take 650 mg by  mouth as needed for moderate pain (prior to infusion).   acyclovir 400 MG tablet Commonly known as: ZOVIRAX Take 1 tablet (400 mg total) by mouth 2 (two) times daily.   albuterol 108 (90 Base) MCG/ACT inhaler Commonly known as: Ventolin HFA Inhale 2 puffs into the lungs every 4 (four) hours as needed for wheezing or shortness of breath.   albuterol (2.5 MG/3ML) 0.083% nebulizer solution Commonly known as: PROVENTIL Take 3 mLs (2.5 mg total) by nebulization every 4 (four) hours as needed for wheezing or shortness of breath.   ALPRAZolam 0.5 MG tablet Commonly known as: XANAX TAKE 1 TABLET BY MOUTH DURING THE DAY AS NEEDED FOR ANXIETY AND 2 TABLETS AT BEDTIME AS NEEDED FOR ANXIETY/INSOMNIA   CALCIUM 1000 + D PO Take 1,000 mg by mouth daily.   Compressor/Nebulizer Misc 1 Units by Does not apply route 3 (three) times daily as needed.   dexAMETHasone 20 MG Tabs Take 1 tablet by mouth as directed. Take 1 tablet 30 minutes prior to infusions   dextromethorphan-guaiFENesin 30-600 MG 12hr tablet Commonly known as: MUCINEX DM Take 1 tablet by mouth 2 (two) times daily.   diphenhydrAMINE 25 MG tablet Commonly known as: BENADRYL Take 50 mg by mouth as needed (prior to infusion).   Eliquis 2.5 MG Tabs tablet Generic drug: apixaban Take 2.5 mg by mouth 2 (two) times daily.   escitalopram 20 MG tablet Commonly known as: LEXAPRO Take 20 mg by mouth daily.   HYDROcodone-acetaminophen 10-325 MG tablet Commonly known as: NORCO Take 1 tablet by mouth every 6 (six) hours as needed for up to 7 days.   magnesium oxide 400 (240 Mg) MG tablet Commonly known as: MAG-OX Take 1 tablet (400 mg total) by mouth 2 (two) times daily.   metFORMIN 500 MG tablet Commonly known as: GLUCOPHAGE Take 1 tablet (500 mg total) by mouth daily with breakfast.   methocarbamol 500 MG tablet Commonly known as: ROBAXIN Take 1 tablet (500 mg total) by mouth every 8 (eight) hours as needed for muscle spasms  (back pain).   midodrine 2.5 MG tablet Commonly known as: PROAMATINE Take 1 tablet (2.5 mg total) by mouth 3 (three) times daily with meals.   montelukast 10 MG tablet Commonly known as: SINGULAIR TAKE 1 TABLET BY MOUTH AS DIRECTED 30 MINUTES PRIOR TO TREATMENT What changed: See the new instructions.   Multi Vitamin Tabs Take 1 tablet by mouth daily.   pantoprazole 40 MG tablet Commonly known as: PROTONIX Take 1 tablet (40 mg total) by mouth daily.   polyethylene glycol 17 g packet Commonly known as: MIRALAX / GLYCOLAX Take 17 g by mouth daily as needed for moderate constipation.   prochlorperazine 10 MG tablet Commonly known as: COMPAZINE Take 1 tablet (10 mg total) by mouth every 6 (six) hours as needed for nausea or vomiting.   rosuvastatin 10 MG tablet Commonly known as: CRESTOR Take 10 mg by mouth daily.   zolpidem 5 MG tablet Commonly known as: AMBIEN Take 2 tablets (10 mg total) by mouth at bedtime as needed for sleep.  Contact information for follow-up providers     [name], MD. Schedule an appointment as soon as possible for a visit in 1 week(s).   Specialty: Internal Medicine Contact information: [address] [place] [address] [phone]              Contact information for after-discharge care     Destination     [facility] .   Service: Skilled Nursing Contact information: [address] [place] [phone]                    Discharge Exam: Filed Weights   09/18/22 0500 09/19/22 0500 09/20/22 0500  Weight: 77 kg 81.5 kg 78.1 kg   General: NAD  Cardiovascular: S1, S2 present Respiratory: CTAB Abdomen: Soft, nontender, nondistended, bowel sounds present Musculoskeletal: No bilateral pedal edema noted Skin: Normal Psychiatry: Normal mood   Condition at discharge: stable  The results of significant diagnostics from this hospitalization  (including imaging, microbiology, ancillary and laboratory) are listed below for reference.   Imaging Studies: MR THORACIC SPINE W WO CONTRAST  Result Date: 09/20/2022 CLINICAL DATA:  Multiple myeloma.  Assess for Mets. EXAM: MRI THORACIC AND LUMBAR SPINE WITHOUT AND WITH CONTRAST TECHNIQUE: Multiplanar and multiecho pulse sequences of the thoracic and lumbar spine were obtained without and with intravenous contrast. CONTRAST:  73m GADAVIST GADOBUTROL 1 MMOL/ML IV SOLN COMPARISON:  PET CT10/5/23, CT C SPine 09/17/22 FINDINGS: MRI THORACIC SPINE FINDINGS Alignment:  Mildly exaggerated thoracic kyphosis. Vertebrae: No evidence of an acute compression deformity. Vertebral body heights are preserved. There are mild degenerative endplate changes. In the visualized cervical spine there is a T2 hyperintense lesion along the superior endplate of C7 (series 21, image 11), which is favored to represent a myelomatous lesion. -there is a contrast-enhancing lesion in the posterior elements of T11 (series 25, image 12), favored to represent a myelomatous lesion. Cord:  Normal signal and morphology. Paraspinal and other soft tissues: Negative Disc levels: There are small disc bulges at the T10-T11 and T12-L1 levels resulting in mild spinal canal stenosis. No evidence of high-grade spinal canal stenosis. No evidence of high-grade neural foraminal stenosis. MRI LUMBAR SPINE FINDINGS Segmentation:  Standard. Alignment:  Physiologic. Vertebrae: Mild mid vertebral body height loss at L4 is favored to be degenerative. No evidence of discitis or osteomyelitis. There is a small punctate focal contrast-enhancing lesion along the inferior endplate of L1 which could be degenerative or represent a small myelomatous lesion. Conus medullaris: Extends to the L1 level and appears normal. Paraspinal and other soft tissues: There is atrophy of the paraspinal musculature bilaterally. The urinary bladder is markedly distended. Disc levels: There  is multilevel degenerative disc disease with a right paracentral disc protrusion at T12-L1, a circumferential disc bulge at L1-L2, L2-L3, L3-L4, and L4-L5. The disc bulges results in mild spinal canal narrowing. There is no evidence of high-grade neural foraminal stenosis. IMPRESSION: 1. Small contrast-enhancing lesions in the posterior elements of T11 and possibly along the superior endplate of C7 are favored to represent myelomatous lesions. 2. Punctate contrast-enhancing lesion along the inferior endplate of L1 could be degenerative or represent a small myelomatous lesion. 3. Multilevel degenerative changes in the thoracolumbar spine without evidence of high grade spinal canal or neuroforaminal stenosis. Electronically Signed   By: HMarin RobertsM.D.   On: 09/20/2022 09:29   MR Lumbar Spine W Wo Contrast  Result Date: 09/20/2022 CLINICAL DATA:  Multiple myeloma.  Assess  for Mets. EXAM: MRI THORACIC AND LUMBAR SPINE WITHOUT AND WITH CONTRAST TECHNIQUE: Multiplanar and multiecho pulse sequences of the thoracic and lumbar spine were obtained without and with intravenous contrast. CONTRAST:  56m GADAVIST GADOBUTROL 1 MMOL/ML IV SOLN COMPARISON:  PET CT10/5/23, CT C SPine 09/17/22 FINDINGS: MRI THORACIC SPINE FINDINGS Alignment:  Mildly exaggerated thoracic kyphosis. Vertebrae: No evidence of an acute compression deformity. Vertebral body heights are preserved. There are mild degenerative endplate changes. In the visualized cervical spine there is a T2 hyperintense lesion along the superior endplate of C7 (series 21, image 11), which is favored to represent a myelomatous lesion. -there is a contrast-enhancing lesion in the posterior elements of T11 (series 25, image 12), favored to represent a myelomatous lesion. Cord:  Normal signal and morphology. Paraspinal and other soft tissues: Negative Disc levels: There are small disc bulges at the T10-T11 and T12-L1 levels resulting in mild spinal canal stenosis. No  evidence of high-grade spinal canal stenosis. No evidence of high-grade neural foraminal stenosis. MRI LUMBAR SPINE FINDINGS Segmentation:  Standard. Alignment:  Physiologic. Vertebrae: Mild mid vertebral body height loss at L4 is favored to be degenerative. No evidence of discitis or osteomyelitis. There is a small punctate focal contrast-enhancing lesion along the inferior endplate of L1 which could be degenerative or represent a small myelomatous lesion. Conus medullaris: Extends to the L1 level and appears normal. Paraspinal and other soft tissues: There is atrophy of the paraspinal musculature bilaterally. The urinary bladder is markedly distended. Disc levels: There is multilevel degenerative disc disease with a right paracentral disc protrusion at T12-L1, a circumferential disc bulge at L1-L2, L2-L3, L3-L4, and L4-L5. The disc bulges results in mild spinal canal narrowing. There is no evidence of high-grade neural foraminal stenosis. IMPRESSION: 1. Small contrast-enhancing lesions in the posterior elements of T11 and possibly along the superior endplate of C7 are favored to represent myelomatous lesions. 2. Punctate contrast-enhancing lesion along the inferior endplate of L1 could be degenerative or represent a small myelomatous lesion. 3. Multilevel degenerative changes in the thoracolumbar spine without evidence of high grade spinal canal or neuroforaminal stenosis. Electronically Signed   By: HMarin RobertsM.D.   On: 09/20/2022 09:29   ECHOCARDIOGRAM COMPLETE  Result Date: 09/18/2022    ECHOCARDIOGRAM REPORT   Patient Name:   Alexandra NAZARIODate of Exam: 09/18/2022 Medical Rec #:  [id number]      Height:       63.0 in Accession #:    [id number]     Weight:       169.8 lb Date of Birth:  907-30-1947      BSA:          1.803 m Patient Age:    89years        BP:           132/80 mmHg Patient Gender: F               HR:           79 bpm. [facility] Echo, Cardiac Doppler and  Color Doppler Indications:    R55 Syncope  History:        Patient has prior history of Echocardiogram examinations, most                 recent 05/28/2018. Risk Factors:Hypertension, Diabetes and                 Dyslipidemia. Multiple  myeloma not having achieved remission,                 Acute renal failure superimposed on stage 3b chronic kidney                 disease.  Sonographer:    Alvino Chapel RCS Referring Phys: [id number] [place]  1. Left ventricular ejection fraction, by estimation, is 55 to 60%. The left ventricle has normal function. The left ventricle has no regional wall motion abnormalities. Left ventricular diastolic parameters are consistent with Grade I diastolic dysfunction (impaired relaxation).  2. Right ventricular systolic function is normal. The right ventricular size is normal. Tricuspid regurgitation signal is inadequate for assessing PA pressure.  3. Left atrial size was mild to moderately dilated.  4. The mitral valve is abnormal. Trivial mitral valve regurgitation. No evidence of mitral stenosis.  5. The aortic valve is tricuspid. There is mild calcification of the aortic valve. Aortic valve regurgitation is not visualized. No aortic stenosis is present.  6. The inferior vena cava is normal in size with greater than 50% respiratory variability, suggesting right atrial pressure of 3 mmHg. Comparison(s): No prior Echocardiogram. FINDINGS  Left Ventricle: Left ventricular ejection fraction, by estimation, is 55 to 60%. The left ventricle has normal function. The left ventricle has no regional wall motion abnormalities. The left ventricular internal cavity size was normal in size. There is  no left ventricular hypertrophy. Left ventricular diastolic parameters are consistent with Grade I diastolic dysfunction (impaired relaxation). Right Ventricle: The right ventricular size is normal. No increase in right ventricular wall thickness. Right ventricular systolic function is  normal. Tricuspid regurgitation signal is inadequate for assessing PA pressure. Left Atrium: Left atrial size was mild to moderately dilated. Right Atrium: Right atrial size was normal in size. Pericardium: There is no evidence of pericardial effusion. Mitral Valve: The mitral valve is abnormal. Mild to moderate mitral annular calcification. Trivial mitral valve regurgitation. No evidence of mitral valve stenosis. Tricuspid Valve: The tricuspid valve is not well visualized. Tricuspid valve regurgitation is not demonstrated. No evidence of tricuspid stenosis. Aortic Valve: The aortic valve is tricuspid. There is mild calcification of the aortic valve. Aortic valve regurgitation is not visualized. No aortic stenosis is present. Pulmonic Valve: The pulmonic valve was not well visualized. Pulmonic valve regurgitation is not visualized. No evidence of pulmonic stenosis. Aorta: The aortic root is normal in size and structure. Venous: The inferior vena cava is normal in size with greater than 50% respiratory variability, suggesting right atrial pressure of 3 mmHg. IAS/Shunts: No atrial level shunt detected by color flow Doppler.  LEFT VENTRICLE PLAX 2D LVIDd:         4.60 cm   Diastology LVIDs:         2.90 cm   LV e' medial:    7.62 cm/s LV PW:         0.90 cm   LV E/e' medial:  12.7 LV IVS:        0.90 cm   LV e' lateral:   8.81 cm/s LVOT diam:     1.90 cm   LV E/e' lateral: 11.0 LV SV:         87 LV SV Index:   48 LVOT Area:     2.84 cm  RIGHT VENTRICLE RV S prime:     13.60 cm/s TAPSE (M-mode): 2.0 cm LEFT ATRIUM             Index  RIGHT ATRIUM           Index LA diam:        3.60 cm 2.00 cm/m   RA Area:     11.90 cm LA Vol (A2C):   75.3 ml 41.75 ml/m  RA Volume:   25.80 ml  14.31 ml/m LA Vol (A4C):   58.1 ml 32.22 ml/m LA Biplane Vol: 69.2 ml 38.37 ml/m  AORTIC VALVE LVOT Vmax:   153.00 cm/s LVOT Vmean:  95.800 cm/s LVOT VTI:    0.306 m  AORTA Ao Root diam: 3.20 cm MITRAL VALVE MV Area (PHT): 3.37 cm      SHUNTS MV Decel Time: 225 msec     Systemic VTI:  0.31 m MV E velocity: 96.80 cm/s   Systemic Diam: 1.90 cm MV A velocity: 115.00 cm/s MV E/A ratio:  0.84 Vishnu Priya Mallipeddi Electronically signed by Lorelee Cover Mallipeddi Signature Date/Time: 09/18/2022/1:47:41 PM    Final    CT Head Wo Contrast  Result Date: 09/17/2022 CLINICAL DATA:  Head trauma, intracranial injury suspected. Patient became dizzy and fell face first on the floor. EXAM: CT HEAD WITHOUT CONTRAST CT MAXILLOFACIAL WITHOUT CONTRAST CT CERVICAL SPINE WITHOUT CONTRAST TECHNIQUE: Multidetector CT imaging of the head, cervical spine, and maxillofacial structures were performed using the standard protocol without intravenous contrast. Multiplanar CT image reconstructions of the cervical spine and maxillofacial structures were also generated. RADIATION DOSE REDUCTION: This exam was performed according to the departmental dose-optimization program which includes automated exposure control, adjustment of the mA and/or kV according to patient size and/or use of iterative reconstruction technique. COMPARISON:  None Available. FINDINGS: CT HEAD FINDINGS Brain: No evidence of acute infarction, hemorrhage, hydrocephalus, extra-axial collection or mass lesion/mass effect. Patchy areas of low-attenuation suggesting moderate chronic microvascular ischemic changes. Vascular: No hyperdense vessel or unexpected calcification. Skull: Multiple calvarial lucencies consistent with history of myeloma. Negative for fracture or focal lesion. Other: None. CT MAXILLOFACIAL FINDINGS Osseous: Mild irregularity of bilateral nasal bones with surrounding soft tissue swelling suggesting nondisplaced fracture. Orbits: Negative. No traumatic or inflammatory finding. Sinuses: Clear. Soft tissues: Soft tissue swelling about the nose. No drainable fluid collection or hematoma. CT CERVICAL SPINE FINDINGS Alignment: Normal. Skull base and vertebrae: Multiple lucencies of the  cervical vertebral bodies without evidence of acute fracture. Soft tissues and spinal canal: No prevertebral fluid or swelling. No visible canal hematoma. Disc levels: Multilevel degenerate disc disease with disc height loss. Multilevel facet joint arthropathy. Upper chest: Biapical pleural/parenchymal scarring. Other: None IMPRESSION: CT HEAD: 1. No acute intracranial abnormality. 2. Multiple calvarial lucencies consistent with history of myeloma. CT MAXILLOFACIAL: Mild irregularity of bilateral nasal bones with surrounding soft tissue swelling suggesting nondisplaced fracture. CT CERVICAL SPINE: 1. No acute fracture or traumatic subluxation. 2. Multilevel degenerate disc disease and facet joint arthropathy. 3. Multiple lucencies of the cervical vertebral bodies consistent with history of myeloma. Electronically Signed   By: Keane Police D.O.   On: 09/17/2022 13:26   CT Cervical Spine Wo Contrast  Result Date: 09/17/2022 CLINICAL DATA:  Head trauma, intracranial injury suspected. Patient became dizzy and fell face first on the floor. EXAM: CT HEAD WITHOUT CONTRAST CT MAXILLOFACIAL WITHOUT CONTRAST CT CERVICAL SPINE WITHOUT CONTRAST TECHNIQUE: Multidetector CT imaging of the head, cervical spine, and maxillofacial structures were performed using the standard protocol without intravenous contrast. Multiplanar CT image reconstructions of the cervical spine and maxillofacial structures were also generated. RADIATION DOSE REDUCTION: This exam was performed according to the departmental dose-optimization  program which includes automated exposure control, adjustment of the mA and/or kV according to patient size and/or use of iterative reconstruction technique. COMPARISON:  None Available. FINDINGS: CT HEAD FINDINGS Brain: No evidence of acute infarction, hemorrhage, hydrocephalus, extra-axial collection or mass lesion/mass effect. Patchy areas of low-attenuation suggesting moderate chronic microvascular ischemic  changes. Vascular: No hyperdense vessel or unexpected calcification. Skull: Multiple calvarial lucencies consistent with history of myeloma. Negative for fracture or focal lesion. Other: None. CT MAXILLOFACIAL FINDINGS Osseous: Mild irregularity of bilateral nasal bones with surrounding soft tissue swelling suggesting nondisplaced fracture. Orbits: Negative. No traumatic or inflammatory finding. Sinuses: Clear. Soft tissues: Soft tissue swelling about the nose. No drainable fluid collection or hematoma. CT CERVICAL SPINE FINDINGS Alignment: Normal. Skull base and vertebrae: Multiple lucencies of the cervical vertebral bodies without evidence of acute fracture. Soft tissues and spinal canal: No prevertebral fluid or swelling. No visible canal hematoma. Disc levels: Multilevel degenerate disc disease with disc height loss. Multilevel facet joint arthropathy. Upper chest: Biapical pleural/parenchymal scarring. Other: None IMPRESSION: CT HEAD: 1. No acute intracranial abnormality. 2. Multiple calvarial lucencies consistent with history of myeloma. CT MAXILLOFACIAL: Mild irregularity of bilateral nasal bones with surrounding soft tissue swelling suggesting nondisplaced fracture. CT CERVICAL SPINE: 1. No acute fracture or traumatic subluxation. 2. Multilevel degenerate disc disease and facet joint arthropathy. 3. Multiple lucencies of the cervical vertebral bodies consistent with history of myeloma. Electronically Signed   By: Keane Police D.O.   On: 09/17/2022 13:26   CT Maxillofacial Wo Contrast  Result Date: 09/17/2022 CLINICAL DATA:  Head trauma, intracranial injury suspected. Patient became dizzy and fell face first on the floor. EXAM: CT HEAD WITHOUT CONTRAST CT MAXILLOFACIAL WITHOUT CONTRAST CT CERVICAL SPINE WITHOUT CONTRAST TECHNIQUE: Multidetector CT imaging of the head, cervical spine, and maxillofacial structures were performed using the standard protocol without intravenous contrast. Multiplanar CT image  reconstructions of the cervical spine and maxillofacial structures were also generated. RADIATION DOSE REDUCTION: This exam was performed according to the departmental dose-optimization program which includes automated exposure control, adjustment of the mA and/or kV according to patient size and/or use of iterative reconstruction technique. COMPARISON:  None Available. FINDINGS: CT HEAD FINDINGS Brain: No evidence of acute infarction, hemorrhage, hydrocephalus, extra-axial collection or mass lesion/mass effect. Patchy areas of low-attenuation suggesting moderate chronic microvascular ischemic changes. Vascular: No hyperdense vessel or unexpected calcification. Skull: Multiple calvarial lucencies consistent with history of myeloma. Negative for fracture or focal lesion. Other: None. CT MAXILLOFACIAL FINDINGS Osseous: Mild irregularity of bilateral nasal bones with surrounding soft tissue swelling suggesting nondisplaced fracture. Orbits: Negative. No traumatic or inflammatory finding. Sinuses: Clear. Soft tissues: Soft tissue swelling about the nose. No drainable fluid collection or hematoma. CT CERVICAL SPINE FINDINGS Alignment: Normal. Skull base and vertebrae: Multiple lucencies of the cervical vertebral bodies without evidence of acute fracture. Soft tissues and spinal canal: No prevertebral fluid or swelling. No visible canal hematoma. Disc levels: Multilevel degenerate disc disease with disc height loss. Multilevel facet joint arthropathy. Upper chest: Biapical pleural/parenchymal scarring. Other: None IMPRESSION: CT HEAD: 1. No acute intracranial abnormality. 2. Multiple calvarial lucencies consistent with history of myeloma. CT MAXILLOFACIAL: Mild irregularity of bilateral nasal bones with surrounding soft tissue swelling suggesting nondisplaced fracture. CT CERVICAL SPINE: 1. No acute fracture or traumatic subluxation. 2. Multilevel degenerate disc disease and facet joint arthropathy. 3. Multiple lucencies  of the cervical vertebral bodies consistent with history of myeloma. Electronically Signed   By: Keane Police D.O.   On:  09/17/2022 13:26   US Abdomen Complete  Result Date: 09/14/2022 CLINICAL DATA:  Abdominal pain for 3 days with diarrhea. Hysterectomy and appendectomy. EXAM: ABDOMEN ULTRASOUND COMPLETE COMPARISON:  Abdominal ultrasound 05/04/2022. Abdominopelvic CT 09/14/2022. FINDINGS: Gallbladder: No gallstones or wall thickening visualized. No sonographic Murphy sign noted by sonographer. Common bile duct: Diameter: 3 mm Liver: The panic echogenicity is diffusely increased, consistent with steatosis as demonstrated on earlier CT. No focal abnormalities are identified. Portal vein is patent on color Doppler imaging with normal direction of blood flow towards the liver. IVC: No abnormality visualized. Pancreas: Largely obscured by bowel gas.  Seen on previous CT. Spleen: Size and appearance within normal limits. Right Kidney: Length: 8.3 cm. Mildly increased cortical echogenicity. No mass or hydronephrosis visualized. Left Kidney: Length: 10.3 cm. Mildly increased cortical echogenicity. No mass or hydronephrosis visualized. Abdominal aorta: No aneurysm visualized. Other findings: Study limited by body habitus. No ascites identified. IMPRESSION: 1. No acute abdominal process identified. 2. Hepatic steatosis. 3. Mildly increased renal cortical echogenicity, correlate for medical renal disease. Electronically Signed   By: Richardean Sale M.D.   On: 09/14/2022 14:30   CT ABDOMEN PELVIS WO CONTRAST  Result Date: 09/14/2022 CLINICAL DATA:  Acute right-sided abdominal pain. History of multiple myeloma. EXAM: CT ABDOMEN AND PELVIS WITHOUT CONTRAST TECHNIQUE: Multidetector CT imaging of the abdomen and pelvis was performed following the standard protocol without IV contrast. RADIATION DOSE REDUCTION: This exam was performed according to the departmental dose-optimization program which includes automated exposure  control, adjustment of the mA and/or kV according to patient size and/or use of iterative reconstruction technique. COMPARISON:  August 04, 2022. FINDINGS: Lower chest: No acute abnormality. Hepatobiliary: No cholelithiasis or biliary dilatation is noted. Hepatic steatosis. Pancreas: Unremarkable. No pancreatic ductal dilatation or surrounding inflammatory changes. Spleen: Normal in size without focal abnormality. Adrenals/Urinary Tract: Adrenal glands are unremarkable. Kidneys are normal, without renal calculi, focal lesion, or hydronephrosis. Bladder is unremarkable. Stomach/Bowel: Stomach is unremarkable. There is no evidence of bowel obstruction or inflammation. Status post appendectomy. Vascular/Lymphatic: Aortic atherosclerosis. No enlarged abdominal or pelvic lymph nodes. Reproductive: Status post hysterectomy. No adnexal masses. Other: No abdominal wall hernia or abnormality. No abdominopelvic ascites. Musculoskeletal: Old T11 and L4 fractures are noted. No acute fracture or spondylolisthesis is noted. There is again noted diffuse permeative osteopenia consistent with history of multiple myeloma. IMPRESSION: Hepatic steatosis. No acute abnormality seen in the abdomen or pelvis. Stable diffuse permeative osteopenia throughout the visualized skeleton consistent with history of multiple myeloma. Aortic Atherosclerosis (ICD10-I70.0). Electronically Signed   By: Marijo Conception M.D.   On: 09/14/2022 13:32   NM Pulmonary Perfusion  Result Date: 09/03/2022 CLINICAL DATA:  Rule out pulmonary embolism. Shortness of breath and chest pain. EXAM: NUCLEAR MEDICINE PERFUSION LUNG SCAN TECHNIQUE: Perfusion images were obtained in multiple projections after intravenous injection of radiopharmaceutical. Ventilation scans intentionally deferred if perfusion scan and chest x-ray adequate for interpretation during COVID 19 epidemic. RADIOPHARMACEUTICALS:  4.4 mCi Tc-70mMAA IV COMPARISON:  Chest radiograph from earlier  today FINDINGS: No peripheral segmental perfusion defects identified bilaterally. Heterogeneity on the oblique and lateral projection images is favored to represent sequelae of non-uniform soft tissue attenuation artifact. IMPRESSION: 1. No evidence for acute pulmonary embolus. Electronically Signed   By: TKerby MoorsM.D.   On: 09/03/2022 15:47   CT Chest Wo Contrast  Result Date: 09/03/2022 CLINICAL DATA:  Respiratory illness.  Nondiagnostic x-ray. EXAM: CT CHEST WITHOUT CONTRAST TECHNIQUE: Multidetector CT imaging of the chest  was performed following the standard protocol without IV contrast. RADIATION DOSE REDUCTION: This exam was performed according to the departmental dose-optimization program which includes automated exposure control, adjustment of the mA and/or kV according to patient size and/or use of iterative reconstruction technique. COMPARISON:  October 13, 2020 FINDINGS: Cardiovascular: No significant vascular findings. Normal heart size. No pericardial effusion. Right central venous line identified. Mediastinum/Nodes: No enlarged mediastinal or axillary lymph nodes. Trachea, and esophagus demonstrate no significant findings. 4 mm low-density left thyroid nodule is unchanged. Lungs/Pleura: Minimal scar bilaterally noted. Lungs are otherwise clear. No pleural effusion or pneumothorax. Upper Abdomen: Diffuse lucency of liver noted consistent with fatty infiltration of liver. Musculoskeletal: Diffuse lytic lesions throughout the marrow space consistent with clinical history of multiple myeloma unchanged. Degenerative joint changes of the spine. Chronic compression deformity of a lower thoracic vertebral body unchanged. IMPRESSION: 1. No acute abnormality of the chest.  No focal pneumonia. Electronically Signed   By: Abelardo Diesel M.D.   On: 09/03/2022 12:32   CT Head Wo Contrast  Result Date: 09/03/2022 CLINICAL DATA:  Mental status change, unknown cause. Patient complaining of dizziness,  weakness, unsteadiness on her feet, arm heaviness in loss of appetite. History of multiple myeloma. EXAM: CT HEAD WITHOUT CONTRAST TECHNIQUE: Contiguous axial images were obtained from the base of the skull through the vertex without intravenous contrast. RADIATION DOSE REDUCTION: This exam was performed according to the departmental dose-optimization program which includes automated exposure control, adjustment of the mA and/or kV according to patient size and/or use of iterative reconstruction technique. COMPARISON:  MRI brain 03/31/2021. FINDINGS: Brain: No midline shift, ventriculomegaly, mass effect, evidence of mass lesion, intracranial hemorrhage or evidence of cortically based acute infarction. Gray-white matter differentiation is within normal limits throughout the brain. Stable mild chronic small-vessel disease. Vascular: No hyperdense vessel. Skull: Numerous circumscribed, lytic lesions of the calvarium, compatible with known history of multiple myeloma. Sinuses/Orbits: Paranasal sinuses, mastoids and middle ear cavities are well aerated. Orbits are unremarkable. Other: None. IMPRESSION: No acute intracranial abnormalities. Electronically Signed   By: Emmit Alexanders M.D   On: 09/03/2022 12:25   DG Chest Port 1 View  Result Date: 09/03/2022 CLINICAL DATA:  Weakness EXAM: PORTABLE CHEST 1 VIEW COMPARISON:  Radiograph 08/14/2022 FINDINGS: Chest port catheter tip overlies the mid SVC. Unchanged cardiomediastinal silhouette. There is no focal airspace consolidation. There is no pleural effusion or evidence of pneumothorax. Chronic lower thoracic compression deformity. Chronic left clavicle injury with prominent callus formation. Chronic left upper rib injury. Thoracic spondylosis. IMPRESSION: No evidence of acute cardiopulmonary disease. Electronically Signed   By: Maurine Simmering M.D.   On: 09/03/2022 10:47    Microbiology: Results for orders placed or performed during the hospital encounter of 09/17/22   Urine Culture (for pregnant, neutropenic or urologic patients or patients with an indwelling urinary catheter)     Status: Abnormal   Collection Time: 09/17/22  4:28 PM   Specimen: Urine, Clean Catch  Result Value Ref Range Status   Specimen Description   Final    URINE, CLEAN CATCH Performed at [facility], [address] [place], [place] [address]    Special Requests   Final    Immunocompromised Performed at [facility], [address], [place], [place] [address]    Culture (A)  Final    >=100,000 COLONIES/mL DIPHTHEROIDS(CORYNEBACTERIUM SPECIES) Standardized susceptibility testing for this organism is not available. Performed at [facility], [place] [address], [place], [place] [address]  Report Status 09/18/2022 FINAL  Final    Labs: CBC: Recent Labs  Lab 09/14/22 1225 09/17/22 1211 09/18/22 0418 09/19/22 0429 09/20/22 0419 09/21/22 0418  WBC 4.8 8.1 4.8 4.2 4.7 4.1  NEUTROABS 3.8 7.2  --   --  3.9 3.2  HGB 10.0* 10.9* 8.3* 8.3* 8.7* 9.1*  HCT 31.0* 33.0* 25.7* 25.7* 26.7* 28.0*  MCV 102.3* 102.2* 102.8* 102.8* 102.3* 103.3*  PLT 80* 55* 46* 56* 72* 91*   Basic Metabolic Panel: Recent Labs  Lab 09/17/22 1211 09/17/22 1619 09/18/22 0418 09/19/22 0429 09/20/22 0419 09/21/22 0418  NA 131*  --  133* 133* 131* 131*  K 4.4  --  3.4* 3.6 3.3* 4.0  CL 94*  --  100 102 98 101  CO2 24  --  '25 26 25 26  '$ GLUCOSE 308*  --  193* 158* 144* 137*  BUN 35*  --  30* '20 15 19  '$ CREATININE 2.20*  --  1.64* 1.27* 1.20* 1.28*  CALCIUM 9.0  --  8.2* 8.5* 8.4* 8.5*  MG  --  1.9  --  1.8  --   --    Liver Function Tests: Recent Labs  Lab 09/14/22 1225  AST 20  ALT 22  ALKPHOS 64  BILITOT 0.5  PROT 6.5  ALBUMIN 3.7   CBG: Recent Labs  Lab 09/20/22 0712 09/20/22 1134 09/20/22 1614 09/20/22 2157 09/21/22 0714  GLUCAP 156* 137* 86 87 142*    Discharge time spent: greater than 30 minutes.  Signed: Alma Friendly, MD [facility] 09/21/2022

## 2022-09-21 NOTE — TOC Transition Note (Signed)
Transition of Care [facility]) - CM/SW Discharge Note   Patient Details  Name: Alexandra Price MRN: [id number] Date of Birth: Mar 16, 1946  [facility] CM/SW Contact:  Ihor Gully, LCSW Phone Number: 09/21/2022, 12:57 PM   Clinical Narrative:    D/c clinicals sent to facility.  Son notified. Nurse to call report. TOC signing off.    Final next level of care: Skilled Nursing Facility Barriers to Discharge: No Barriers Identified   Patient Goals and CMS Choice CMS Medicare.gov Compare Post Acute Care list provided to:: Patient Choice offered to / list presented to : Patient  Discharge Placement                Patient chooses bed at: Other - please specify in the comment section below: [facility]) Patient to be transferred to facility by: [facility] Name of family member notified: Son listed on chart Patient and family notified of of transfer: 09/21/22  Discharge Plan and Services Additional resources added to the After Visit Summary for   In-house Referral: Clinical Social Work   Post Acute Care Choice: [place]                               Social Determinants of Health ([place]) Interventions SDOH Screenings   Food Insecurity: No Food Insecurity (09/17/2022)  Housing: Low Risk  (09/17/2022)  Transportation Needs: No Transportation Needs (09/17/2022)  Utilities: Not At Risk (09/17/2022)  Alcohol Screen: Low Risk  (07/06/2020)  Depression (PHQ2-9): Low Risk  (07/06/2020)  Financial Resource Strain: Low Risk  (07/06/2020)  Physical Activity: Inactive (07/06/2020)  Social Connections: Moderately Isolated (07/06/2020)  Stress: No Stress Concern Present (07/06/2020)  Tobacco Use: Low Risk  (09/17/2022)     Readmission Risk Interventions    09/21/2022   12:50 PM 08/16/2022    1:48 PM  Readmission Risk Prevention Plan  Transportation Screening  Complete  HRI or Home Care Consult  Complete  Social Work  Consult for [place] Planning/Counseling  Complete  Palliative Care Screening  Not Applicable  Medication Review Press photographer)  Complete  PCP or Specialist appointment within 3-5 days of discharge Complete   HRI or [place] Complete   SW Recovery Care/Counseling Consult Complete   [place] Not Applicable

## 2022-09-21 NOTE — Progress Notes (Unsigned)
Faxing signed hard paper script to pharmacy

## 2022-09-21 NOTE — Care Management Important Message (Signed)
Important Message  Patient Details  Name: Alexandra Price MRN: [id number] Date of Birth: 09-Jan-1946   Medicare Important Message Given:  Yes     Tommy Medal 09/21/2022, 11:01 AM

## 2022-09-21 NOTE — Progress Notes (Signed)
   09/21/22 1135  Orthostatic Lying   BP- Lying 128/83  Pulse- Lying 84  Orthostatic Sitting  BP- Sitting 128/71  Pulse- Sitting 90  Orthostatic Standing at 0 minutes  BP- Standing at 0 minutes (!) 89/59  Pulse- Standing at 0 minutes 98  Orthostatic Standing at 3 minutes  BP- Standing at 3 minutes 110/74  Pulse- Standing at 3 minutes 103

## 2022-09-24 ENCOUNTER — Ambulatory Visit: Payer: 59

## 2022-09-24 ENCOUNTER — Other Ambulatory Visit: Payer: Self-pay

## 2022-09-24 ENCOUNTER — Ambulatory Visit: Payer: Medicare Other

## 2022-09-24 ENCOUNTER — Inpatient Hospital Stay: Payer: 59 | Attending: Hematology

## 2022-09-24 ENCOUNTER — Other Ambulatory Visit: Payer: Medicare Other

## 2022-09-24 ENCOUNTER — Other Ambulatory Visit: Payer: 59

## 2022-09-24 ENCOUNTER — Inpatient Hospital Stay: Payer: 59

## 2022-09-24 ENCOUNTER — Ambulatory Visit: Payer: Medicare Other | Admitting: Hematology

## 2022-09-24 ENCOUNTER — Inpatient Hospital Stay (HOSPITAL_BASED_OUTPATIENT_CLINIC_OR_DEPARTMENT_OTHER): Payer: 59 | Admitting: Hematology

## 2022-09-24 ENCOUNTER — Inpatient Hospital Stay: Payer: 59 | Admitting: Hematology

## 2022-09-24 VITALS — BP 96/70 | HR 87 | Temp 97.2°F | Resp 18 | Wt 170.3 lb

## 2022-09-24 DIAGNOSIS — Z7901 Long term (current) use of anticoagulants: Secondary | ICD-10-CM | POA: Insufficient documentation

## 2022-09-24 DIAGNOSIS — Z95828 Presence of other vascular implants and grafts: Secondary | ICD-10-CM

## 2022-09-24 DIAGNOSIS — D631 Anemia in chronic kidney disease: Secondary | ICD-10-CM | POA: Diagnosis not present

## 2022-09-24 DIAGNOSIS — Z9181 History of falling: Secondary | ICD-10-CM | POA: Insufficient documentation

## 2022-09-24 DIAGNOSIS — I951 Orthostatic hypotension: Secondary | ICD-10-CM | POA: Diagnosis not present

## 2022-09-24 DIAGNOSIS — R296 Repeated falls: Secondary | ICD-10-CM | POA: Diagnosis not present

## 2022-09-24 DIAGNOSIS — G479 Sleep disorder, unspecified: Secondary | ICD-10-CM

## 2022-09-24 DIAGNOSIS — C9000 Multiple myeloma not having achieved remission: Secondary | ICD-10-CM

## 2022-09-24 DIAGNOSIS — Z452 Encounter for adjustment and management of vascular access device: Secondary | ICD-10-CM | POA: Diagnosis not present

## 2022-09-24 DIAGNOSIS — F419 Anxiety disorder, unspecified: Secondary | ICD-10-CM | POA: Diagnosis not present

## 2022-09-24 DIAGNOSIS — N1832 Chronic kidney disease, stage 3b: Secondary | ICD-10-CM | POA: Insufficient documentation

## 2022-09-24 DIAGNOSIS — I2699 Other pulmonary embolism without acute cor pulmonale: Secondary | ICD-10-CM | POA: Insufficient documentation

## 2022-09-24 DIAGNOSIS — R0989 Other specified symptoms and signs involving the circulatory and respiratory systems: Secondary | ICD-10-CM | POA: Insufficient documentation

## 2022-09-24 LAB — CBC WITH DIFFERENTIAL/PLATELET
Abs Immature Granulocytes: 0.01 10*3/uL (ref 0.00–0.07)
Basophils Absolute: 0 10*3/uL (ref 0.0–0.1)
Basophils Relative: 1 %
Eosinophils Absolute: 0.1 10*3/uL (ref 0.0–0.5)
Eosinophils Relative: 2 %
HCT: 27.4 % — ABNORMAL LOW (ref 36.0–46.0)
Hemoglobin: 8.8 g/dL — ABNORMAL LOW (ref 12.0–15.0)
Immature Granulocytes: 0 %
Lymphocytes Relative: 8 %
Lymphs Abs: 0.3 10*3/uL — ABNORMAL LOW (ref 0.7–4.0)
MCH: 33.7 pg (ref 26.0–34.0)
MCHC: 32.1 g/dL (ref 30.0–36.0)
MCV: 105 fL — ABNORMAL HIGH (ref 80.0–100.0)
Monocytes Absolute: 0.3 10*3/uL (ref 0.1–1.0)
Monocytes Relative: 9 %
Neutro Abs: 2.9 10*3/uL (ref 1.7–7.7)
Neutrophils Relative %: 80 %
Platelets: 127 10*3/uL — ABNORMAL LOW (ref 150–400)
RBC: 2.61 MIL/uL — ABNORMAL LOW (ref 3.87–5.11)
RDW: 14.7 % (ref 11.5–15.5)
WBC: 3.6 10*3/uL — ABNORMAL LOW (ref 4.0–10.5)
nRBC: 0 % (ref 0.0–0.2)

## 2022-09-24 LAB — MAGNESIUM: Magnesium: 2 mg/dL (ref 1.7–2.4)

## 2022-09-24 LAB — COMPREHENSIVE METABOLIC PANEL
ALT: 17 U/L (ref 0–44)
AST: 20 U/L (ref 15–41)
Albumin: 3.7 g/dL (ref 3.5–5.0)
Alkaline Phosphatase: 63 U/L (ref 38–126)
Anion gap: 9 (ref 5–15)
BUN: 19 mg/dL (ref 8–23)
CO2: 26 mmol/L (ref 22–32)
Calcium: 8.8 mg/dL — ABNORMAL LOW (ref 8.9–10.3)
Chloride: 100 mmol/L (ref 98–111)
Creatinine, Ser: 1.54 mg/dL — ABNORMAL HIGH (ref 0.44–1.00)
GFR, Estimated: 35 mL/min — ABNORMAL LOW (ref >60)
Glucose, Bld: 185 mg/dL — ABNORMAL HIGH (ref 70–99)
Potassium: 3.2 mmol/L — ABNORMAL LOW (ref 3.5–5.1)
Sodium: 135 mmol/L (ref 135–145)
Total Bilirubin: 0.6 mg/dL (ref 0.3–1.2)
Total Protein: 6 g/dL — ABNORMAL LOW (ref 6.5–8.1)

## 2022-09-24 MED ORDER — MIDODRINE HCL 2.5 MG PO TABS: 2.5000 mg | ORAL_TABLET | Freq: Three times a day (TID) | ORAL | 1 refills | Status: DC

## 2022-09-24 MED ORDER — MAGNESIUM SULFATE 2 GM/50ML IV SOLN
2.0000 g | Freq: Once | INTRAVENOUS | Status: AC
  Administered 2022-09-24: 2 g via INTRAVENOUS
  Filled 2022-09-24: qty 50

## 2022-09-24 MED ORDER — SODIUM CHLORIDE 0.9% FLUSH
10.0000 mL | Freq: Once | INTRAVENOUS | Status: AC | PRN
  Administered 2022-09-24: 10 mL

## 2022-09-24 MED ORDER — LANREOTIDE ACETATE 120 MG/0.5ML ~~LOC~~ SOLN
SUBCUTANEOUS | Status: AC
  Filled 2022-09-24: qty 120

## 2022-09-24 MED ORDER — ZOLPIDEM TARTRATE 5 MG PO TABS: 10.0000 mg | ORAL_TABLET | Freq: Every evening | ORAL | Status: DC | PRN

## 2022-09-24 MED ORDER — POTASSIUM CHLORIDE IN NACL 20-0.9 MEQ/L-% IV SOLN
Freq: Once | INTRAVENOUS | Status: AC
  Filled 2022-09-24: qty 1000

## 2022-09-24 MED ORDER — HEPARIN SOD (PORK) LOCK FLUSH 100 UNIT/ML IV SOLN
500.0000 [IU] | Freq: Once | INTRAVENOUS | Status: AC | PRN
  Administered 2022-09-24: 500 [IU]

## 2022-09-24 MED ORDER — HYDROCODONE-ACETAMINOPHEN 10-325 MG PO TABS: 1.0000 | ORAL_TABLET | Freq: Four times a day (QID) | ORAL | 0 refills | Status: DC | PRN

## 2022-09-24 MED ORDER — SODIUM CHLORIDE 0.9% FLUSH
10.0000 mL | INTRAVENOUS | Status: DC | PRN
  Administered 2022-09-24: 10 mL via INTRAVENOUS

## 2022-09-24 NOTE — Progress Notes (Signed)
[place], [place] [address]   CLINIC:  Medical Oncology/Hematology  PCP:  [name], MD [address] [place] / [place] [address] [phone]   REASON FOR VISIT:  Follow-up for multiple myeloma  PRIOR THERAPY:  1. KPD x 4 cycles from 04/17/2018 to 08/21/2018. 2. Stem cell transplant on 10/23/2018. 3. Radiation to left and right humerus 25 Gy in 10 fractions from 08/23/2020 to 08/25/2020. 4.  Maintenance Pomalyst until 06/08/2022  CURRENT THERAPY: Darzalex, Velcade and dexamethasone started on 06/11/2022  BRIEF ONCOLOGIC HISTORY:  Oncology History  Multiple myeloma not having achieved remission ([facility])  04/07/2018 Initial Diagnosis   Multiple myeloma not having achieved remission ([facility])   06/11/2022 -  Chemotherapy   Patient is on Treatment Plan : MYELOMA RELAPSED / REFRACTORY Daratumumab SQ + Bortezomib + Dexamethasone (DaraVd) q21d / Daratumumab SQ q28d      Multiple myeloma without remission ([facility])  04/15/2018 Initial Diagnosis   Multiple myeloma without remission ([facility])   04/17/2018 - 09/05/2018 Chemotherapy   The patient had dexamethasone (DECADRON) 4 MG tablet, 1 of 1 cycle, Start date: 04/15/2018, End date: 05/26/2018 palonosetron (ALOXI) injection 0.25 mg, 0.25 mg, Intravenous,  Once, 1 of 1 cycle Administration: 0.25 mg (04/17/2018), 0.25 mg (04/24/2018), 0.25 mg (05/01/2018) cyclophosphamide (CYTOXAN) 540 mg in sodium chloride 0.9 % 250 mL chemo infusion, 300 mg/m2 = 540 mg, Intravenous,  Once, 1 of 1 cycle Administration: 540 mg (04/17/2018), 540 mg (04/24/2018), 540 mg (05/01/2018) carfilzomib (KYPROLIS) 36 mg in dextrose 5 % 50 mL chemo infusion, 20 mg/m2 = 36 mg, Intravenous, Once, 5 of 5 cycles Administration: 36 mg (04/17/2018), 36 mg (04/18/2018), 60 mg (04/24/2018), 60 mg (04/25/2018), 60 mg (05/01/2018), 60 mg (05/02/2018), 60 mg (05/15/2018), 60 mg (05/16/2018), 60 mg (06/12/2018), 60 mg (06/13/2018), 60 mg (06/26/2018), 60 mg (06/27/2018), 60  mg (07/03/2018), 60 mg (07/04/2018), 60 mg (07/24/2018), 60 mg (07/25/2018), 60 mg (07/31/2018), 60 mg (08/01/2018), 60 mg (08/08/2018), 60 mg (08/07/2018), 60 mg (08/21/2018), 60 mg (08/22/2018), 60 mg (08/28/2018), 60 mg (08/29/2018), 60 mg (09/04/2018), 60 mg (09/05/2018)  for chemotherapy treatment.    06/11/2022 -  Chemotherapy   Patient is on Treatment Plan : MYELOMA RELAPSED / REFRACTORY Daratumumab SQ + Bortezomib + Dexamethasone (DaraVd) q21d / Daratumumab SQ q28d        CANCER STAGING:  Cancer Staging  No matching staging information was found for the patient.  INTERVAL HISTORY:  Alexandra Price, a 77 y.o. female, seen for follow-up of multiple myeloma. She was last seen by me on 09/10/22.  She was hospitalized on 09/17/22 for x4 days after multiple falls and discharged to a skilled nursing facility on 09/21/22. She reports feeling lightheaded prior to these falls. She was found to have orthostatic hypotension in the hospital and was started on Midodrine. She denies any further falls since her hospitalization. However, she states that she was only given 2 doses of Midodrine while at rehab so she is unsure if this helped her. She was not given any Midodrine to take with her once she left the rehab facility x2 days ago. She states that the rehab facility would not give her all of her medications so she checked herself out early.  She uses a walker when she feels that she needs it. Her son helps her at home. She states that she is doing home exercises as advised at PT.  Today, she states that she is doing okay. She has  felt better since being discharged from the hospital. Her appetite level is at 100%. Her energy level is at 50%. She reports back and shoulder pain of 6/10 severity following her falls. She denies any new pains.  She states that taking Ambien is the only way she can sleep at night. She is also taking Xanax TID. She is agreeable to cutting back on the Xanax.   REVIEW OF SYSTEMS:   Review of Systems  Constitutional:  Positive for fatigue. Negative for appetite change, chills and fever.  HENT:   Negative for lump/mass, mouth sores, nosebleeds, sore throat and trouble swallowing.   Eyes:  Negative for eye problems.  Respiratory:  Negative for cough.   Cardiovascular:  Negative for chest pain, leg swelling and palpitations.  Gastrointestinal:  Negative for abdominal pain, constipation, diarrhea, nausea and vomiting.  Genitourinary:  Negative for bladder incontinence, difficulty urinating, dysuria, frequency, hematuria and nocturia.   Musculoskeletal:  Positive for arthralgias and back pain. Negative for flank pain, myalgias and neck pain.  Skin:  Negative for itching and rash.  Neurological:  Positive for dizziness and headaches. Negative for numbness.  Hematological:  Does not bruise/bleed easily.  Psychiatric/Behavioral:  Positive for depression and sleep disturbance. Negative for suicidal ideas. The patient is nervous/anxious.   All other systems reviewed and are negative.   PAST MEDICAL/SURGICAL HISTORY:  Past Medical History:  Diagnosis Date   Anxiety    Chronic kidney disease    Depression    Hypertension    Multiple myeloma ([facility])    multiple myeloma   Pre-diabetes    Past Surgical History:  Procedure Laterality Date   ABDOMINAL HYSTERECTOMY     total   APPENDECTOMY     BALLOON DILATION N/A 05/28/2022   Procedure: BALLOON DILATION;  Surgeon: [name], DO;  [facility];  Service: Endoscopy;  Laterality: N/A;   BIOPSY  05/28/2022   Procedure: BIOPSY;  Surgeon: [name], DO;  [facility];  Service: Endoscopy;;   COLONOSCOPY WITH PROPOFOL N/A 12/25/2021   Procedure: COLONOSCOPY WITH PROPOFOL;  Surgeon: [name], DO;  [facility];  Service: Endoscopy;  Laterality: N/A;  2:30pm   ESOPHAGOGASTRODUODENOSCOPY (EGD) WITH PROPOFOL N/A 05/28/2022   Procedure: ESOPHAGOGASTRODUODENOSCOPY (EGD) WITH  PROPOFOL;  Surgeon: [name], DO;  [facility];  Service: Endoscopy;  Laterality: N/A;  10:00am, asa 3   LAPAROSCOPIC APPENDECTOMY N/A 05/20/2018   Procedure: APPENDECTOMY LAPAROSCOPIC;  Surgeon: [name], MD;  Location: [facility];  Service: General;  Laterality: N/A;   POLYPECTOMY  12/25/2021   Procedure: POLYPECTOMY;  Surgeon: [name], DO;  [facility];  Service: Endoscopy;;   PORTACATH PLACEMENT Right 04/14/2018   Procedure: INSERTION PORT-A-CATH;  Surgeon: [name], MD;  Location: [facility];  Service: General;  Laterality: Right;    SOCIAL HISTORY:  Social History   Socioeconomic History   Marital status: Divorced    Spouse name: Not on file   Number of children: 6   Years of education: Not on file   Highest education level: Not on file  Occupational History    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  Tobacco Use   Smoking status: Never   Smokeless tobacco: Never  Vaping Use   Vaping Use: Never used  Substance and Sexual Activity   Alcohol use: Never   Drug use: Not Currently   Sexual activity: Not Currently  Other Topics Concern   Not on file  Social  History Narrative   Not on file   Social Determinants of Health   Financial Resource Strain: Low Risk  (07/06/2020)   Overall Financial Resource Strain (CARDIA)    Difficulty of Paying Living Expenses: Not hard at all  Food Insecurity: No Food Insecurity (09/17/2022)   Hunger Vital Sign    Worried About Running Out of Food in the Last Year: Never true    Ran Out of Food in the Last Year: Never true  Transportation Needs: No Transportation Needs (09/17/2022)   PRAPARE - Hydrologist (Medical): No    Lack of Transportation (Non-Medical): No  Physical Activity: Inactive (07/06/2020)   Exercise Vital Sign    Days of Exercise per Week: 0 days    Minutes of Exercise per Session: 0 min  Stress: No Stress Concern Present (07/06/2020)   [place]    Feeling of Stress : Not at all  Social Connections: Moderately Isolated (07/06/2020)   Social Connection and Isolation Panel [NHANES]    Frequency of Communication with Friends and Family: More than three times a week    Frequency of Social Gatherings with Friends and Family: Twice a week    Attends Religious Services: 1 to 4 times per year    Active Member of [employer] or Organizations: No    Attends Archivist Meetings: Never    Marital Status: Separated  Intimate Partner Violence: Not At Risk (09/17/2022)   Humiliation, Afraid, Rape, and Kick questionnaire    Fear of Current or Ex-Partner: No    Emotionally Abused: No    Physically Abused: No    Sexually Abused: No    FAMILY HISTORY:  Family History  Problem Relation Age of Onset   Heart disease Mother    Emphysema Father    Diabetes Sister    Depression Sister    Cancer Brother        liver, lung, and colon. colon cancer at age 31.   Diabetes Brother     CURRENT MEDICATIONS:  Current Outpatient Medications  Medication Sig Dispense Refill   acetaminophen (TYLENOL) 325 MG tablet Take 650 mg by mouth as needed for moderate pain (prior to infusion).     acyclovir (ZOVIRAX) 400 MG tablet Take 1 tablet (400 mg total) by mouth 2 (two) times daily. 60 tablet 6   albuterol (PROVENTIL) (2.5 MG/3ML) 0.083% nebulizer solution Take 3 mLs (2.5 mg total) by nebulization every 4 (four) hours as needed for wheezing or shortness of breath. 75 mL 2   albuterol (VENTOLIN HFA) 108 (90 Base) MCG/ACT inhaler Inhale 2 puffs into the lungs every 4 (four) hours as needed for wheezing or shortness of breath. 18 g 2   ALPRAZolam (XANAX) 0.5 MG tablet TAKE 1 TABLET BY MOUTH DURING THE DAY AS NEEDED FOR ANXIETY AND 2 TABLETS AT BEDTIME AS NEEDED FOR ANXIETY/INSOMNIA 15 tablet 0   Calcium Carb-Cholecalciferol (CALCIUM 1000 + D PO) Take 1,000 mg by mouth daily.     dexAMETHasone 20 MG TABS  Take 1 tablet by mouth as directed. Take 1 tablet 30 minutes prior to infusions 30 tablet 2   dextromethorphan-guaiFENesin (MUCINEX DM) 30-600 MG 12hr tablet Take 1 tablet by mouth 2 (two) times daily. 20 tablet 0   diphenhydrAMINE (BENADRYL) 25 MG tablet Take 50 mg by mouth as needed (prior to infusion).     ELIQUIS 2.5 MG TABS tablet Take 2.5 mg by mouth 2 (two)  times daily.     escitalopram (LEXAPRO) 20 MG tablet Take 20 mg by mouth daily.     HYDROcodone-acetaminophen (NORCO) 10-325 MG tablet Take 1 tablet by mouth every 6 (six) hours as needed for up to 7 days. 28 tablet 0   magnesium oxide (MAG-OX) 400 (240 Mg) MG tablet Take 1 tablet (400 mg total) by mouth 2 (two) times daily. 60 tablet 4   metFORMIN (GLUCOPHAGE) 500 MG tablet Take 1 tablet (500 mg total) by mouth daily with breakfast.     methocarbamol (ROBAXIN) 500 MG tablet Take 1 tablet (500 mg total) by mouth every 8 (eight) hours as needed for muscle spasms (back pain). 20 tablet 0   montelukast (SINGULAIR) 10 MG tablet TAKE 1 TABLET BY MOUTH AS DIRECTED 30 MINUTES PRIOR TO TREATMENT (Patient taking differently: Take 10 mg by mouth See admin instructions. Take 1 tablet by mouth as directed 30 minutes prior to treatment) 90 tablet 0   Multiple Vitamin (MULTI VITAMIN) TABS Take 1 tablet by mouth daily.     Nebulizers (COMPRESSOR/NEBULIZER) MISC 1 Units by Does not apply route 3 (three) times daily as needed. 1 each 0   pantoprazole (PROTONIX) 40 MG tablet Take 1 tablet (40 mg total) by mouth daily. 90 tablet 6   polyethylene glycol (MIRALAX / GLYCOLAX) 17 g packet Take 17 g by mouth daily as needed for moderate constipation. 14 each 0   prochlorperazine (COMPAZINE) 10 MG tablet Take 1 tablet (10 mg total) by mouth every 6 (six) hours as needed for nausea or vomiting. 30 tablet 0   rosuvastatin (CRESTOR) 10 MG tablet Take 10 mg by mouth daily.     zolpidem (AMBIEN) 5 MG tablet Take 2 tablets (10 mg total) by mouth at bedtime as needed for  sleep.     midodrine (PROAMATINE) 2.5 MG tablet Take 1 tablet (2.5 mg total) by mouth 3 (three) times daily with meals. 90 tablet 1   No current facility-administered medications for this visit.    ALLERGIES:  Allergies  Allergen Reactions   Ciprofloxacin Anaphylaxis   Amoxicillin Other (See Comments)    unknown   Morphine And Related Nausea And Vomiting   Augmentin [Amoxicillin-Pot Clavulanate] Other (See Comments)    Headache, insomnia    PHYSICAL EXAM:  Performance status (ECOG): 1 - Symptomatic but completely ambulatory     09/24/2022    8:53 AM 09/21/2022    6:46 AM 09/21/2022    6:00 AM  Vitals with BMI  Weight 170 lbs 5 oz    BMI 47.65    Systolic 96 465 035  Diastolic 70 88 50  Pulse 87 90 73     Wt Readings from Last 3 Encounters:  09/24/22 77.2 kg (170 lb 4.8 oz)  09/20/22 78.1 kg (172 lb 2.9 oz)  09/14/22 76 kg (167 lb 8.8 oz)   Physical Exam Vitals reviewed. Exam conducted with a chaperone present.  Constitutional:      Appearance: Normal appearance.  Cardiovascular:     Rate and Rhythm: Normal rate and regular rhythm.     Pulses: Normal pulses.     Heart sounds: Normal heart sounds.  Pulmonary:     Effort: Pulmonary effort is normal.     Breath sounds: Normal breath sounds.  Abdominal:     Palpations: Abdomen is soft. There is no hepatomegaly, splenomegaly or mass.     Tenderness: There is no abdominal tenderness.  Musculoskeletal:     Right lower leg: No edema.  Left lower leg: No edema.  Lymphadenopathy:     Upper Body:     Right upper body: No supraclavicular, axillary or pectoral adenopathy.     Left upper body: No supraclavicular, axillary or pectoral adenopathy.  Neurological:     General: No focal deficit present.     Mental Status: She is alert and oriented to person, place, and time.  Psychiatric:        Mood and Affect: Mood normal.        Behavior: Behavior normal.      LABORATORY DATA:  I have reviewed the labs as listed.      Latest Ref Rng & Units 09/24/2022    9:01 AM 09/21/2022    4:18 AM 09/20/2022    4:19 AM  CBC  WBC 4.0 - 10.5 K/uL 3.6  4.1  4.7   Hemoglobin 12.0 - 15.0 g/dL 8.8  9.1  8.7   Hematocrit 36.0 - 46.0 % 27.4  28.0  26.7   Platelets 150 - 400 K/uL 127  91  72       Latest Ref Rng & Units 09/24/2022    9:01 AM 09/21/2022    4:18 AM 09/20/2022    4:19 AM  CMP  Glucose 70 - 99 mg/dL 185  137  144   BUN 8 - 23 mg/dL '19  19  15   '$ Creatinine 0.44 - 1.00 mg/dL 1.54  1.28  1.20   Sodium 135 - 145 mmol/L 135  131  131   Potassium 3.5 - 5.1 mmol/L 3.2  4.0  3.3   Chloride 98 - 111 mmol/L 100  101  98   CO2 22 - 32 mmol/L '26  26  25   '$ Calcium 8.9 - 10.3 mg/dL 8.8  8.5  8.4   Total Protein 6.5 - 8.1 g/dL 6.0     Total Bilirubin 0.3 - 1.2 mg/dL 0.6     Alkaline Phos 38 - 126 U/L 63     AST 15 - 41 U/L 20     ALT 0 - 44 U/L 17       DIAGNOSTIC IMAGING:  I have independently reviewed the scans and discussed with the patient. MR THORACIC SPINE W WO CONTRAST  Result Date: 09/20/2022 CLINICAL DATA:  Multiple myeloma.  Assess for Mets. EXAM: MRI THORACIC AND LUMBAR SPINE WITHOUT AND WITH CONTRAST TECHNIQUE: Multiplanar and multiecho pulse sequences of the thoracic and lumbar spine were obtained without and with intravenous contrast. CONTRAST:  70m GADAVIST GADOBUTROL 1 MMOL/ML IV SOLN COMPARISON:  PET CT10/5/23, CT C SPine 09/17/22 FINDINGS: MRI THORACIC SPINE FINDINGS Alignment:  Mildly exaggerated thoracic kyphosis. Vertebrae: No evidence of an acute compression deformity. Vertebral body heights are preserved. There are mild degenerative endplate changes. In the visualized cervical spine there is a T2 hyperintense lesion along the superior endplate of C7 (series 21, image 11), which is favored to represent a myelomatous lesion. -there is a contrast-enhancing lesion in the posterior elements of T11 (series 25, image 12), favored to represent a myelomatous lesion. Cord:  Normal signal and morphology. Paraspinal and  other soft tissues: Negative Disc levels: There are small disc bulges at the T10-T11 and T12-L1 levels resulting in mild spinal canal stenosis. No evidence of high-grade spinal canal stenosis. No evidence of high-grade neural foraminal stenosis. MRI LUMBAR SPINE FINDINGS Segmentation:  Standard. Alignment:  Physiologic. Vertebrae: Mild mid vertebral body height loss at L4 is favored to be degenerative. No evidence of discitis or osteomyelitis.  There is a small punctate focal contrast-enhancing lesion along the inferior endplate of L1 which could be degenerative or represent a small myelomatous lesion. Conus medullaris: Extends to the L1 level and appears normal. Paraspinal and other soft tissues: There is atrophy of the paraspinal musculature bilaterally. The urinary bladder is markedly distended. Disc levels: There is multilevel degenerative disc disease with a right paracentral disc protrusion at T12-L1, a circumferential disc bulge at L1-L2, L2-L3, L3-L4, and L4-L5. The disc bulges results in mild spinal canal narrowing. There is no evidence of high-grade neural foraminal stenosis. IMPRESSION: 1. Small contrast-enhancing lesions in the posterior elements of T11 and possibly along the superior endplate of C7 are favored to represent myelomatous lesions. 2. Punctate contrast-enhancing lesion along the inferior endplate of L1 could be degenerative or represent a small myelomatous lesion. 3. Multilevel degenerative changes in the thoracolumbar spine without evidence of high grade spinal canal or neuroforaminal stenosis. Electronically Signed   By: Marin Roberts M.D.   On: 09/20/2022 09:29   MR Lumbar Spine W Wo Contrast  Result Date: 09/20/2022 CLINICAL DATA:  Multiple myeloma.  Assess for Mets. EXAM: MRI THORACIC AND LUMBAR SPINE WITHOUT AND WITH CONTRAST TECHNIQUE: Multiplanar and multiecho pulse sequences of the thoracic and lumbar spine were obtained without and with intravenous contrast. CONTRAST:  6m  GADAVIST GADOBUTROL 1 MMOL/ML IV SOLN COMPARISON:  PET CT10/5/23, CT C SPine 09/17/22 FINDINGS: MRI THORACIC SPINE FINDINGS Alignment:  Mildly exaggerated thoracic kyphosis. Vertebrae: No evidence of an acute compression deformity. Vertebral body heights are preserved. There are mild degenerative endplate changes. In the visualized cervical spine there is a T2 hyperintense lesion along the superior endplate of C7 (series 21, image 11), which is favored to represent a myelomatous lesion. -there is a contrast-enhancing lesion in the posterior elements of T11 (series 25, image 12), favored to represent a myelomatous lesion. Cord:  Normal signal and morphology. Paraspinal and other soft tissues: Negative Disc levels: There are small disc bulges at the T10-T11 and T12-L1 levels resulting in mild spinal canal stenosis. No evidence of high-grade spinal canal stenosis. No evidence of high-grade neural foraminal stenosis. MRI LUMBAR SPINE FINDINGS Segmentation:  Standard. Alignment:  Physiologic. Vertebrae: Mild mid vertebral body height loss at L4 is favored to be degenerative. No evidence of discitis or osteomyelitis. There is a small punctate focal contrast-enhancing lesion along the inferior endplate of L1 which could be degenerative or represent a small myelomatous lesion. Conus medullaris: Extends to the L1 level and appears normal. Paraspinal and other soft tissues: There is atrophy of the paraspinal musculature bilaterally. The urinary bladder is markedly distended. Disc levels: There is multilevel degenerative disc disease with a right paracentral disc protrusion at T12-L1, a circumferential disc bulge at L1-L2, L2-L3, L3-L4, and L4-L5. The disc bulges results in mild spinal canal narrowing. There is no evidence of high-grade neural foraminal stenosis. IMPRESSION: 1. Small contrast-enhancing lesions in the posterior elements of T11 and possibly along the superior endplate of C7 are favored to represent myelomatous  lesions. 2. Punctate contrast-enhancing lesion along the inferior endplate of L1 could be degenerative or represent a small myelomatous lesion. 3. Multilevel degenerative changes in the thoracolumbar spine without evidence of high grade spinal canal or neuroforaminal stenosis. Electronically Signed   By: HMarin RobertsM.D.   On: 09/20/2022 09:29   ECHOCARDIOGRAM COMPLETE  Result Date: 09/18/2022    ECHOCARDIOGRAM REPORT   Patient Name:   Alexandra ROBECKDate of Exam: 09/18/2022 Medical Rec #:  [id number]       Height:       63.0 in Accession #:    [id number]      Weight:       169.8 lb Date of Birth:  [birth year]       BSA:          1.803 m Patient Age:    42 years        BP:           132/80 mmHg Patient Gender: F               HR:           79 bpm. Exam Location:  [name] Procedure: 2D Echo, Cardiac Doppler and Color Doppler Indications:    R55 Syncope  History:        Patient has prior history of Echocardiogram examinations, most                 recent 05/28/2018. Risk Factors:Hypertension, Diabetes and                 Dyslipidemia. Multiple myeloma not having achieved remission,                 Acute renal failure superimposed on stage 3b chronic kidney                 disease.  Sonographer:    Alvino Chapel RCS Referring Phys: [id number] [place]  1. Left ventricular ejection fraction, by estimation, is 55 to 60%. The left ventricle has normal function. The left ventricle has no regional wall motion abnormalities. Left ventricular diastolic parameters are consistent with Grade I diastolic dysfunction (impaired relaxation).  2. Right ventricular systolic function is normal. The right ventricular size is normal. Tricuspid regurgitation signal is inadequate for assessing PA pressure.  3. Left atrial size was mild to moderately dilated.  4. The mitral valve is abnormal. Trivial mitral valve regurgitation. No evidence of mitral stenosis.  5. The aortic valve is tricuspid. There is mild calcification  of the aortic valve. Aortic valve regurgitation is not visualized. No aortic stenosis is present.  6. The inferior vena cava is normal in size with greater than 50% respiratory variability, suggesting right atrial pressure of 3 mmHg. Comparison(s): No prior Echocardiogram. FINDINGS  Left Ventricle: Left ventricular ejection fraction, by estimation, is 55 to 60%. The left ventricle has normal function. The left ventricle has no regional wall motion abnormalities. The left ventricular internal cavity size was normal in size. There is  no left ventricular hypertrophy. Left ventricular diastolic parameters are consistent with Grade I diastolic dysfunction (impaired relaxation). Right Ventricle: The right ventricular size is normal. No increase in right ventricular wall thickness. Right ventricular systolic function is normal. Tricuspid regurgitation signal is inadequate for assessing PA pressure. Left Atrium: Left atrial size was mild to moderately dilated. Right Atrium: Right atrial size was normal in size. Pericardium: There is no evidence of pericardial effusion. Mitral Valve: The mitral valve is abnormal. Mild to moderate mitral annular calcification. Trivial mitral valve regurgitation. No evidence of mitral valve stenosis. Tricuspid Valve: The tricuspid valve is not well visualized. Tricuspid valve regurgitation is not demonstrated. No evidence of tricuspid stenosis. Aortic Valve: The aortic valve is tricuspid. There is mild calcification of the aortic valve. Aortic valve regurgitation is not visualized. No aortic stenosis is present. Pulmonic Valve: The pulmonic valve was not well visualized. Pulmonic valve regurgitation is not visualized. No evidence of pulmonic  stenosis. Aorta: The aortic root is normal in size and structure. Venous: The inferior vena cava is normal in size with greater than 50% respiratory variability, suggesting right atrial pressure of 3 mmHg. IAS/Shunts: No atrial level shunt detected by  color flow Doppler.  LEFT VENTRICLE PLAX 2D LVIDd:         4.60 cm   Diastology LVIDs:         2.90 cm   LV e' medial:    7.62 cm/s LV PW:         0.90 cm   LV E/e' medial:  12.7 LV IVS:        0.90 cm   LV e' lateral:   8.81 cm/s LVOT diam:     1.90 cm   LV E/e' lateral: 11.0 LV SV:         87 LV SV Index:   48 LVOT Area:     2.84 cm  RIGHT VENTRICLE RV S prime:     13.60 cm/s TAPSE (M-mode): 2.0 cm LEFT ATRIUM             Index        RIGHT ATRIUM           Index LA diam:        3.60 cm 2.00 cm/m   RA Area:     11.90 cm LA Vol (A2C):   75.3 ml 41.75 ml/m  RA Volume:   25.80 ml  14.31 ml/m LA Vol (A4C):   58.1 ml 32.22 ml/m LA Biplane Vol: 69.2 ml 38.37 ml/m  AORTIC VALVE LVOT Vmax:   153.00 cm/s LVOT Vmean:  95.800 cm/s LVOT VTI:    0.306 m  AORTA Ao Root diam: 3.20 cm MITRAL VALVE MV Area (PHT): 3.37 cm     SHUNTS MV Decel Time: 225 msec     Systemic VTI:  0.31 m MV E velocity: 96.80 cm/s   Systemic Diam: 1.90 cm MV A velocity: 115.00 cm/s MV E/A ratio:  0.84 Vishnu Priya Mallipeddi Electronically signed by Lorelee Cover Mallipeddi Signature Date/Time: 09/18/2022/1:47:41 PM    Final    CT Head Wo Contrast  Result Date: 09/17/2022 CLINICAL DATA:  Head trauma, intracranial injury suspected. Patient became dizzy and fell face first on the floor. EXAM: CT HEAD WITHOUT CONTRAST CT MAXILLOFACIAL WITHOUT CONTRAST CT CERVICAL SPINE WITHOUT CONTRAST TECHNIQUE: Multidetector CT imaging of the head, cervical spine, and maxillofacial structures were performed using the standard protocol without intravenous contrast. Multiplanar CT image reconstructions of the cervical spine and maxillofacial structures were also generated. RADIATION DOSE REDUCTION: This exam was performed according to the departmental dose-optimization program which includes automated exposure control, adjustment of the mA and/or kV according to patient size and/or use of iterative reconstruction technique. COMPARISON:  None Available. FINDINGS: CT  HEAD FINDINGS Brain: No evidence of acute infarction, hemorrhage, hydrocephalus, extra-axial collection or mass lesion/mass effect. Patchy areas of low-attenuation suggesting moderate chronic microvascular ischemic changes. Vascular: No hyperdense vessel or unexpected calcification. Skull: Multiple calvarial lucencies consistent with history of myeloma. Negative for fracture or focal lesion. Other: None. CT MAXILLOFACIAL FINDINGS Osseous: Mild irregularity of bilateral nasal bones with surrounding soft tissue swelling suggesting nondisplaced fracture. Orbits: Negative. No traumatic or inflammatory finding. Sinuses: Clear. Soft tissues: Soft tissue swelling about the nose. No drainable fluid collection or hematoma. CT CERVICAL SPINE FINDINGS Alignment: Normal. Skull base and vertebrae: Multiple lucencies of the cervical vertebral bodies without evidence of acute fracture. Soft tissues and spinal canal:  No prevertebral fluid or swelling. No visible canal hematoma. Disc levels: Multilevel degenerate disc disease with disc height loss. Multilevel facet joint arthropathy. Upper chest: Biapical pleural/parenchymal scarring. Other: None IMPRESSION: CT HEAD: 1. No acute intracranial abnormality. 2. Multiple calvarial lucencies consistent with history of myeloma. CT MAXILLOFACIAL: Mild irregularity of bilateral nasal bones with surrounding soft tissue swelling suggesting nondisplaced fracture. CT CERVICAL SPINE: 1. No acute fracture or traumatic subluxation. 2. Multilevel degenerate disc disease and facet joint arthropathy. 3. Multiple lucencies of the cervical vertebral bodies consistent with history of myeloma. Electronically Signed   By: Keane Police D.O.   On: 09/17/2022 13:26   CT Cervical Spine Wo Contrast  Result Date: 09/17/2022 CLINICAL DATA:  Head trauma, intracranial injury suspected. Patient became dizzy and fell face first on the floor. EXAM: CT HEAD WITHOUT CONTRAST CT MAXILLOFACIAL WITHOUT CONTRAST CT  CERVICAL SPINE WITHOUT CONTRAST TECHNIQUE: Multidetector CT imaging of the head, cervical spine, and maxillofacial structures were performed using the standard protocol without intravenous contrast. Multiplanar CT image reconstructions of the cervical spine and maxillofacial structures were also generated. RADIATION DOSE REDUCTION: This exam was performed according to the departmental dose-optimization program which includes automated exposure control, adjustment of the mA and/or kV according to patient size and/or use of iterative reconstruction technique. COMPARISON:  None Available. FINDINGS: CT HEAD FINDINGS Brain: No evidence of acute infarction, hemorrhage, hydrocephalus, extra-axial collection or mass lesion/mass effect. Patchy areas of low-attenuation suggesting moderate chronic microvascular ischemic changes. Vascular: No hyperdense vessel or unexpected calcification. Skull: Multiple calvarial lucencies consistent with history of myeloma. Negative for fracture or focal lesion. Other: None. CT MAXILLOFACIAL FINDINGS Osseous: Mild irregularity of bilateral nasal bones with surrounding soft tissue swelling suggesting nondisplaced fracture. Orbits: Negative. No traumatic or inflammatory finding. Sinuses: Clear. Soft tissues: Soft tissue swelling about the nose. No drainable fluid collection or hematoma. CT CERVICAL SPINE FINDINGS Alignment: Normal. Skull base and vertebrae: Multiple lucencies of the cervical vertebral bodies without evidence of acute fracture. Soft tissues and spinal canal: No prevertebral fluid or swelling. No visible canal hematoma. Disc levels: Multilevel degenerate disc disease with disc height loss. Multilevel facet joint arthropathy. Upper chest: Biapical pleural/parenchymal scarring. Other: None IMPRESSION: CT HEAD: 1. No acute intracranial abnormality. 2. Multiple calvarial lucencies consistent with history of myeloma. CT MAXILLOFACIAL: Mild irregularity of bilateral nasal bones with  surrounding soft tissue swelling suggesting nondisplaced fracture. CT CERVICAL SPINE: 1. No acute fracture or traumatic subluxation. 2. Multilevel degenerate disc disease and facet joint arthropathy. 3. Multiple lucencies of the cervical vertebral bodies consistent with history of myeloma. Electronically Signed   By: Keane Police D.O.   On: 09/17/2022 13:26   CT Maxillofacial Wo Contrast  Result Date: 09/17/2022 CLINICAL DATA:  Head trauma, intracranial injury suspected. Patient became dizzy and fell face first on the floor. EXAM: CT HEAD WITHOUT CONTRAST CT MAXILLOFACIAL WITHOUT CONTRAST CT CERVICAL SPINE WITHOUT CONTRAST TECHNIQUE: Multidetector CT imaging of the head, cervical spine, and maxillofacial structures were performed using the standard protocol without intravenous contrast. Multiplanar CT image reconstructions of the cervical spine and maxillofacial structures were also generated. RADIATION DOSE REDUCTION: This exam was performed according to the departmental dose-optimization program which includes automated exposure control, adjustment of the mA and/or kV according to patient size and/or use of iterative reconstruction technique. COMPARISON:  None Available. FINDINGS: CT HEAD FINDINGS Brain: No evidence of acute infarction, hemorrhage, hydrocephalus, extra-axial collection or mass lesion/mass effect. Patchy areas of low-attenuation suggesting moderate chronic microvascular ischemic changes. Vascular:  No hyperdense vessel or unexpected calcification. Skull: Multiple calvarial lucencies consistent with history of myeloma. Negative for fracture or focal lesion. Other: None. CT MAXILLOFACIAL FINDINGS Osseous: Mild irregularity of bilateral nasal bones with surrounding soft tissue swelling suggesting nondisplaced fracture. Orbits: Negative. No traumatic or inflammatory finding. Sinuses: Clear. Soft tissues: Soft tissue swelling about the nose. No drainable fluid collection or hematoma. CT CERVICAL  SPINE FINDINGS Alignment: Normal. Skull base and vertebrae: Multiple lucencies of the cervical vertebral bodies without evidence of acute fracture. Soft tissues and spinal canal: No prevertebral fluid or swelling. No visible canal hematoma. Disc levels: Multilevel degenerate disc disease with disc height loss. Multilevel facet joint arthropathy. Upper chest: Biapical pleural/parenchymal scarring. Other: None IMPRESSION: CT HEAD: 1. No acute intracranial abnormality. 2. Multiple calvarial lucencies consistent with history of myeloma. CT MAXILLOFACIAL: Mild irregularity of bilateral nasal bones with surrounding soft tissue swelling suggesting nondisplaced fracture. CT CERVICAL SPINE: 1. No acute fracture or traumatic subluxation. 2. Multilevel degenerate disc disease and facet joint arthropathy. 3. Multiple lucencies of the cervical vertebral bodies consistent with history of myeloma. Electronically Signed   By: Keane Police D.O.   On: 09/17/2022 13:26   US Abdomen Complete  Result Date: 09/14/2022 CLINICAL DATA:  Abdominal pain for 3 days with diarrhea. Hysterectomy and appendectomy. EXAM: ABDOMEN ULTRASOUND COMPLETE COMPARISON:  Abdominal ultrasound 05/04/2022. Abdominopelvic CT 09/14/2022. FINDINGS: Gallbladder: No gallstones or wall thickening visualized. No sonographic Murphy sign noted by sonographer. Common bile duct: Diameter: 3 mm Liver: The panic echogenicity is diffusely increased, consistent with steatosis as demonstrated on earlier CT. No focal abnormalities are identified. Portal vein is patent on color Doppler imaging with normal direction of blood flow towards the liver. IVC: No abnormality visualized. Pancreas: Largely obscured by bowel gas.  Seen on previous CT. Spleen: Size and appearance within normal limits. Right Kidney: Length: 8.3 cm. Mildly increased cortical echogenicity. No mass or hydronephrosis visualized. Left Kidney: Length: 10.3 cm. Mildly increased cortical echogenicity. No mass or  hydronephrosis visualized. Abdominal aorta: No aneurysm visualized. Other findings: Study limited by body habitus. No ascites identified. IMPRESSION: 1. No acute abdominal process identified. 2. Hepatic steatosis. 3. Mildly increased renal cortical echogenicity, correlate for medical renal disease. Electronically Signed   By: Richardean Sale M.D.   On: 09/14/2022 14:30   CT ABDOMEN PELVIS WO CONTRAST  Result Date: 09/14/2022 CLINICAL DATA:  Acute right-sided abdominal pain. History of multiple myeloma. EXAM: CT ABDOMEN AND PELVIS WITHOUT CONTRAST TECHNIQUE: Multidetector CT imaging of the abdomen and pelvis was performed following the standard protocol without IV contrast. RADIATION DOSE REDUCTION: This exam was performed according to the departmental dose-optimization program which includes automated exposure control, adjustment of the mA and/or kV according to patient size and/or use of iterative reconstruction technique. COMPARISON:  August 04, 2022. FINDINGS: Lower chest: No acute abnormality. Hepatobiliary: No cholelithiasis or biliary dilatation is noted. Hepatic steatosis. Pancreas: Unremarkable. No pancreatic ductal dilatation or surrounding inflammatory changes. Spleen: Normal in size without focal abnormality. Adrenals/Urinary Tract: Adrenal glands are unremarkable. Kidneys are normal, without renal calculi, focal lesion, or hydronephrosis. Bladder is unremarkable. Stomach/Bowel: Stomach is unremarkable. There is no evidence of bowel obstruction or inflammation. Status post appendectomy. Vascular/Lymphatic: Aortic atherosclerosis. No enlarged abdominal or pelvic lymph nodes. Reproductive: Status post hysterectomy. No adnexal masses. Other: No abdominal wall hernia or abnormality. No abdominopelvic ascites. Musculoskeletal: Old T11 and L4 fractures are noted. No acute fracture or spondylolisthesis is noted. There is again noted diffuse permeative osteopenia consistent with history  of multiple myeloma.  IMPRESSION: Hepatic steatosis. No acute abnormality seen in the abdomen or pelvis. Stable diffuse permeative osteopenia throughout the visualized skeleton consistent with history of multiple myeloma. Aortic Atherosclerosis (ICD10-I70.0). Electronically Signed   By: Marijo Conception M.D.   On: 09/14/2022 13:32   NM Pulmonary Perfusion  Result Date: 09/03/2022 CLINICAL DATA:  Rule out pulmonary embolism. Shortness of breath and chest pain. EXAM: NUCLEAR MEDICINE PERFUSION LUNG SCAN TECHNIQUE: Perfusion images were obtained in multiple projections after intravenous injection of radiopharmaceutical. Ventilation scans intentionally deferred if perfusion scan and chest x-ray adequate for interpretation during COVID 19 epidemic. RADIOPHARMACEUTICALS:  4.4 mCi Tc-40mMAA IV COMPARISON:  Chest radiograph from earlier today FINDINGS: No peripheral segmental perfusion defects identified bilaterally. Heterogeneity on the oblique and lateral projection images is favored to represent sequelae of non-uniform soft tissue attenuation artifact. IMPRESSION: 1. No evidence for acute pulmonary embolus. Electronically Signed   By: TKerby MoorsM.D.   On: 09/03/2022 15:47   CT Chest Wo Contrast  Result Date: 09/03/2022 CLINICAL DATA:  Respiratory illness.  Nondiagnostic x-ray. EXAM: CT CHEST WITHOUT CONTRAST TECHNIQUE: Multidetector CT imaging of the chest was performed following the standard protocol without IV contrast. RADIATION DOSE REDUCTION: This exam was performed according to the departmental dose-optimization program which includes automated exposure control, adjustment of the mA and/or kV according to patient size and/or use of iterative reconstruction technique. COMPARISON:  October 13, 2020 FINDINGS: Cardiovascular: No significant vascular findings. Normal heart size. No pericardial effusion. Right central venous line identified. Mediastinum/Nodes: No enlarged mediastinal or axillary lymph nodes. Trachea, and  esophagus demonstrate no significant findings. 4 mm low-density left thyroid nodule is unchanged. Lungs/Pleura: Minimal scar bilaterally noted. Lungs are otherwise clear. No pleural effusion or pneumothorax. Upper Abdomen: Diffuse lucency of liver noted consistent with fatty infiltration of liver. Musculoskeletal: Diffuse lytic lesions throughout the marrow space consistent with clinical history of multiple myeloma unchanged. Degenerative joint changes of the spine. Chronic compression deformity of a lower thoracic vertebral body unchanged. IMPRESSION: 1. No acute abnormality of the chest.  No focal pneumonia. Electronically Signed   By: WAbelardo DieselM.D.   On: 09/03/2022 12:32   CT Head Wo Contrast  Result Date: 09/03/2022 CLINICAL DATA:  Mental status change, unknown cause. Patient complaining of dizziness, weakness, unsteadiness on her feet, arm heaviness in loss of appetite. History of multiple myeloma. EXAM: CT HEAD WITHOUT CONTRAST TECHNIQUE: Contiguous axial images were obtained from the base of the skull through the vertex without intravenous contrast. RADIATION DOSE REDUCTION: This exam was performed according to the departmental dose-optimization program which includes automated exposure control, adjustment of the mA and/or kV according to patient size and/or use of iterative reconstruction technique. COMPARISON:  MRI brain 03/31/2021. FINDINGS: Brain: No midline shift, ventriculomegaly, mass effect, evidence of mass lesion, intracranial hemorrhage or evidence of cortically based acute infarction. Gray-white matter differentiation is within normal limits throughout the brain. Stable mild chronic small-vessel disease. Vascular: No hyperdense vessel. Skull: Numerous circumscribed, lytic lesions of the calvarium, compatible with known history of multiple myeloma. Sinuses/Orbits: Paranasal sinuses, mastoids and middle ear cavities are well aerated. Orbits are unremarkable. Other: None. IMPRESSION: No  acute intracranial abnormalities. Electronically Signed   By: WEmmit AlexandersM.D   On: 09/03/2022 12:25   DG Chest Port 1 View  Result Date: 09/03/2022 CLINICAL DATA:  Weakness EXAM: PORTABLE CHEST 1 VIEW COMPARISON:  Radiograph 08/14/2022 FINDINGS: Chest port catheter tip overlies the mid SVC. Unchanged cardiomediastinal silhouette. There  is no focal airspace consolidation. There is no pleural effusion or evidence of pneumothorax. Chronic lower thoracic compression deformity. Chronic left clavicle injury with prominent callus formation. Chronic left upper rib injury. Thoracic spondylosis. IMPRESSION: No evidence of acute cardiopulmonary disease. Electronically Signed   By: Maurine Simmering M.D.   On: 09/03/2022 10:47     ASSESSMENT:  1.  IgG lambda plasma cell myeloma, stage II, standard risk: -4 cycles of KPD from 04/17/2018 through 08/21/2018, stem cell transplant on 10/23/2018. -PET scan on 01/21/2019 showed multiple bone lesions but without any hypermetabolic activity. -BMBX on 01/21/2019 with normocellular marrow with no increase in plasma cells.  Normal FISH.  MRD results negative. -Maintenance pomalidomide 2 mg 3 weeks on/1 week off started on 03/10/2019. -Bone marrow biopsy on 10/29/2019 shows trilineage hematopoiesis with no evidence of plasma cells.  Chromosome analysis and FISH are normal. -BM BX on 10/27/2020 at [facility] with 30 to 40%, TLH.  2% of the total cells are CD 138+ plasma cells. - Pomalyst was held due to dizziness from 03/29/2021 through 04/26/2021. - MRI of the brain on 03/31/2021 did not show any evidence of intracranial lesions.  Multiple bone lesions in the skull compatible with sequela of myeloma.  - Labs at [facility] on 11/16/2021: M spike 0.24 g.  Immunofixation positive for IgG lambda. - PET scan on 12/14/2021: No evidence of FDG avid osseous or soft tissue myeloma. - She has developed left shoulder blade and right posterior rib pain for the last 3 to 4 months. -  24-hour urine on 12/14/2021: Total protein 136 mg.  Immune immunofixation positive for lambda type Bence-Jones protein. -- PET scan (05/24/2022): Single new hypermetabolic lytic lesion involving left lamina of T11.  No canal encroachment.  No other hypermetabolic bone lesions.  Stable diffuse lytic lesions throughout the axial and appendicular skeleton.  No soft tissue lesions. - Maintenance Pomalyst discontinued on 05/29/2022 - XRT to the T11 completed on 06/20/2022 - Daratumumab, Velcade and dexamethasone started on 06/11/2022  2.  Pulmonary embolism: -CT angiogram on 04/21/2020 showed filling defect at [facility]. -VQ scan confirmed pulmonary embolism. -She is on Eliquis.   3.  Right shoulder and upper arm pain: - She received XRT to the chest from 12/26/2020 through 01/06/2021.  4.  Myeloma bone disease: - Denosumab held due to exposure of bone in the right lower jaw.  Last dose in 2020.   PLAN:  1.  IgG lambda plasma cell myeloma: - She was recently hospitalized for repeated falls with dizziness. - Found to be orthostatic.  She was started on midodrine.  She is not taking it now as she signed out [facility] after 2 days in the rehab center as they were not giving her medications. - She is still fatigued. - We discussed myeloma results from 09/06/2022.  M spike is improved to 0.2 g.  Lambda light chains improved to 21.5 from 52.  Ratio is 0.19.  She is using walker for ambulation. - Reviewed labs today which showed hemoglobin is 8.8, white count 3.6 and platelet count 127.  Creatinine is elevated at 1.54. - Blood pressure is 96/70.  We will give her 1 L of fluid with electrolytes over 2 hours.  Will hold her treatment today.  I will reevaluate her in 2 weeks.   2.  Back pain/left shoulder blade pain/right lateral and posterior rib pains: - Continue hydrocodone 10/325 every 6 hours as needed.   3.  Hypomagnesemia: - Magnesium is normal  today.  Continue magnesium twice daily.   4.   Anxiety: -She is taking Xanax 3 times daily.  I have told her to cut back to twice a day.   5.  Sleeping difficulty: - Continue Ambien at bedtime.   6.  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  Hemoglobin is 8.8 today.   7.  Pulmonary embolism: - Continue Eliquis daily.  No bleeding issues.   Orders placed this encounter:  No orders of the defined types were placed in this encounter.    I,[name],acting as a Education administrator for [employer], MD.,have documented all relevant documentation on the behalf of [name], MD,as directed by  [name], MD while in the presence of [name], MD.  I, [name] MD, have reviewed the above documentation for accuracy and completeness, and I agree with the above.     [name], MD [place] [phone]

## 2022-09-24 NOTE — Patient Instructions (Signed)
[facility]  Discharge Instructions: Thank you for choosing [facility] to provide your oncology and hematology care.  If you have a lab appointment with the [facility], please come in thru the Main Entrance and check in at the main information desk.  Wear comfortable clothing and clothing appropriate for easy access to any Portacath or PICC line.   We strive to give you quality time with your provider. You may need to reschedule your appointment if you arrive late (15 or more minutes).  Arriving late affects you and other patients whose appointments are after yours.  Also, if you miss three or more appointments without notifying the office, you may be dismissed from the clinic at the provider's discretion.      For prescription refill requests, have your pharmacy contact our office and allow 72 hours for refills to be completed.    Today you received hydration fluids and magnesium per MD orders.    To help prevent nausea and vomiting after your treatment, we encourage you to take your nausea medication as directed.  BELOW ARE SYMPTOMS THAT SHOULD BE REPORTED IMMEDIATELY: *FEVER GREATER THAN 100.4 F (38 C) OR HIGHER *CHILLS OR SWEATING *NAUSEA AND VOMITING THAT IS NOT CONTROLLED WITH YOUR NAUSEA MEDICATION *UNUSUAL SHORTNESS OF BREATH *UNUSUAL BRUISING OR BLEEDING *URINARY PROBLEMS (pain or burning when urinating, or frequent urination) *BOWEL PROBLEMS (unusual diarrhea, constipation, pain near the anus) TENDERNESS IN MOUTH AND THROAT WITH OR WITHOUT PRESENCE OF ULCERS (sore throat, sores in mouth, or a toothache) UNUSUAL RASH, SWELLING OR PAIN  UNUSUAL VAGINAL DISCHARGE OR ITCHING   Items with * indicate a potential emergency and should be followed up as soon as possible or go to the Emergency Department if any problems should occur.  Please show the CHEMOTHERAPY ALERT CARD or IMMUNOTHERAPY ALERT CARD at check-in to the Emergency Department and  triage nurse.  Should you have questions after your visit or need to cancel or reschedule your appointment, please contact [facility] [phone]  and follow the prompts.  Office hours are 8:00 a.m. to 4:30 p.m. Monday - Friday. Please note that voicemails left after 4:00 p.m. may not be returned until the following business day.  We are closed weekends and major holidays. You have access to a nurse at all times for urgent questions. Please call the main number to the clinic [phone] and follow the prompts.  For any non-urgent questions, you may also contact your provider using MyChart. We now offer e-Visits for anyone 31 and older to request care online for non-urgent symptoms. For details visit [email or web address].   Also download the MyChart app! Go to the app store, search "MyChart", open the app, select [facility], and log in with your MyChart username and password.

## 2022-09-24 NOTE — Patient Instructions (Addendum)
[place]  Discharge Instructions  You were seen and examined today by Dr. Delton Coombes.  You will receive fluids today - no treatment.  Hydrocodone, Ambien and Xanax are all high risk medications that can increase the risk of falls as well as your low blood pressure. Decrease these medications as you are able and take Midodrine as prescribed.  Follow-up as scheduled.  Thank you for choosing [facility] to provide your oncology and hematology care.   To afford each patient quality time with our provider, please arrive at least 15 minutes before your scheduled appointment time. You may need to reschedule your appointment if you arrive late (10 or more minutes). Arriving late affects you and other patients whose appointments are after yours.  Also, if you miss three or more appointments without notifying the office, you may be dismissed from the clinic at the provider's discretion.    Again, thank you for choosing [facility].  Our hope is that these requests will decrease the amount of time that you wait before being seen by our physicians.   If you have a lab appointment with the [facility] please come in thru the Main Entrance and check in at the main information desk.           _____________________________________________________________  Should you have questions after your visit to [facility], please contact our office at [phone] and follow the prompts.  Our office hours are 8:00 a.m. to 4:30 p.m. Monday - Thursday and 8:00 a.m. to 2:30 p.m. Friday.  Please note that voicemails left after 4:00 p.m. may not be returned until the following business day.  We are closed weekends and all major holidays.  You do have access to a nurse 24-7, just call the main number to the clinic [phone] and do not press any options, hold on the line and a nurse will answer the phone.    For prescription refill  requests, have your pharmacy contact our office and allow 72 hours.    Masks are optional in the cancer centers. If you would like for your care team to wear a mask while they are taking care of you, please let them know. You may have one support person who is at least 75 years old accompany you for your appointments.

## 2022-09-24 NOTE — Progress Notes (Signed)
No treatment today per Dr. Delton Coombes. Patient to receive 1L IVF over 2 hours per Dr. Delton Coombes. Primary RN and Pharmacy made aware.

## 2022-09-24 NOTE — Progress Notes (Signed)
Patient here for treatment and office visit with MD today. Labs reviewed, no treatment today per MD. Will give hydration fluids as ordered today.   Patient tolerated it well without problems. Vitals stable and discharged home from clinic via wheelchair. Follow up as scheduled.

## 2022-09-24 NOTE — Progress Notes (Signed)
Patients port flushed without difficulty.  Good blood return noted with no bruising or swelling noted at site.  Patient remains accessed for chemotherapy treatment.  

## 2022-09-25 ENCOUNTER — Other Ambulatory Visit: Payer: Self-pay

## 2022-09-25 ENCOUNTER — Ambulatory Visit (HOSPITAL_COMMUNITY): Payer: 59 | Admitting: Physical Therapy

## 2022-09-25 ENCOUNTER — Encounter: Payer: Self-pay | Admitting: *Deleted

## 2022-09-25 DIAGNOSIS — C9000 Multiple myeloma not having achieved remission: Secondary | ICD-10-CM

## 2022-09-25 DIAGNOSIS — F419 Anxiety disorder, unspecified: Secondary | ICD-10-CM

## 2022-09-25 DIAGNOSIS — G479 Sleep disorder, unspecified: Secondary | ICD-10-CM

## 2022-09-25 MED ORDER — HYDROCODONE-ACETAMINOPHEN 10-325 MG PO TABS: 1.0000 | ORAL_TABLET | Freq: Four times a day (QID) | ORAL | 0 refills | Status: DC | PRN

## 2022-09-25 MED ORDER — ZOLPIDEM TARTRATE 5 MG PO TABS: 10.0000 mg | ORAL_TABLET | Freq: Every evening | ORAL | 0 refills | Status: DC | PRN

## 2022-09-25 MED ORDER — ALPRAZOLAM 0.5 MG PO TABS: 0.5000 mg | ORAL_TABLET | Freq: Two times a day (BID) | ORAL | 0 refills | Status: DC | PRN

## 2022-09-25 NOTE — Progress Notes (Signed)
Hydrocodone 10/325 mg approved by plan until 08/20/23.

## 2022-09-26 ENCOUNTER — Other Ambulatory Visit: Payer: Self-pay

## 2022-09-27 ENCOUNTER — Ambulatory Visit: Payer: Medicare Other

## 2022-09-27 ENCOUNTER — Inpatient Hospital Stay: Payer: 59

## 2022-09-27 ENCOUNTER — Ambulatory Visit: Payer: 59

## 2022-10-01 ENCOUNTER — Inpatient Hospital Stay: Payer: 59

## 2022-10-01 ENCOUNTER — Other Ambulatory Visit: Payer: 59

## 2022-10-01 ENCOUNTER — Other Ambulatory Visit: Payer: Medicare Other

## 2022-10-01 ENCOUNTER — Ambulatory Visit: Payer: 59

## 2022-10-01 ENCOUNTER — Inpatient Hospital Stay (HOSPITAL_BASED_OUTPATIENT_CLINIC_OR_DEPARTMENT_OTHER): Payer: 59 | Admitting: Physician Assistant

## 2022-10-01 ENCOUNTER — Ambulatory Visit: Payer: Medicare Other

## 2022-10-01 VITALS — BP 134/79 | HR 94 | Temp 98.8°F | Resp 18

## 2022-10-01 DIAGNOSIS — G479 Sleep disorder, unspecified: Secondary | ICD-10-CM

## 2022-10-01 DIAGNOSIS — C9000 Multiple myeloma not having achieved remission: Secondary | ICD-10-CM

## 2022-10-01 DIAGNOSIS — F419 Anxiety disorder, unspecified: Secondary | ICD-10-CM | POA: Diagnosis not present

## 2022-10-01 DIAGNOSIS — Z95828 Presence of other vascular implants and grafts: Secondary | ICD-10-CM

## 2022-10-01 LAB — CBC WITH DIFFERENTIAL/PLATELET
Abs Immature Granulocytes: 0.01 10*3/uL (ref 0.00–0.07)
Basophils Absolute: 0 10*3/uL (ref 0.0–0.1)
Basophils Relative: 1 %
Eosinophils Absolute: 0.1 10*3/uL (ref 0.0–0.5)
Eosinophils Relative: 2 %
HCT: 29.3 % — ABNORMAL LOW (ref 36.0–46.0)
Hemoglobin: 9.4 g/dL — ABNORMAL LOW (ref 12.0–15.0)
Immature Granulocytes: 0 %
Lymphocytes Relative: 13 %
Lymphs Abs: 0.4 10*3/uL — ABNORMAL LOW (ref 0.7–4.0)
MCH: 33.8 pg (ref 26.0–34.0)
MCHC: 32.1 g/dL (ref 30.0–36.0)
MCV: 105.4 fL — ABNORMAL HIGH (ref 80.0–100.0)
Monocytes Absolute: 0.4 10*3/uL (ref 0.1–1.0)
Monocytes Relative: 13 %
Neutro Abs: 2.4 10*3/uL (ref 1.7–7.7)
Neutrophils Relative %: 71 %
Platelets: 129 10*3/uL — ABNORMAL LOW (ref 150–400)
RBC: 2.78 MIL/uL — ABNORMAL LOW (ref 3.87–5.11)
RDW: 13.8 % (ref 11.5–15.5)
WBC: 3.3 10*3/uL — ABNORMAL LOW (ref 4.0–10.5)
nRBC: 0 % (ref 0.0–0.2)

## 2022-10-01 LAB — COMPREHENSIVE METABOLIC PANEL
ALT: 17 U/L (ref 0–44)
AST: 21 U/L (ref 15–41)
Albumin: 3.9 g/dL (ref 3.5–5.0)
Alkaline Phosphatase: 75 U/L (ref 38–126)
Anion gap: 11 (ref 5–15)
BUN: 28 mg/dL — ABNORMAL HIGH (ref 8–23)
CO2: 27 mmol/L (ref 22–32)
Calcium: 9.2 mg/dL (ref 8.9–10.3)
Chloride: 100 mmol/L (ref 98–111)
Creatinine, Ser: 1.85 mg/dL — ABNORMAL HIGH (ref 0.44–1.00)
GFR, Estimated: 28 mL/min — ABNORMAL LOW (ref >60)
Glucose, Bld: 182 mg/dL — ABNORMAL HIGH (ref 70–99)
Potassium: 3.7 mmol/L (ref 3.5–5.1)
Sodium: 138 mmol/L (ref 135–145)
Total Bilirubin: 0.5 mg/dL (ref 0.3–1.2)
Total Protein: 6.6 g/dL (ref 6.5–8.1)

## 2022-10-01 LAB — MAGNESIUM: Magnesium: 2 mg/dL (ref 1.7–2.4)

## 2022-10-01 MED ORDER — HEPARIN SOD (PORK) LOCK FLUSH 100 UNIT/ML IV SOLN
500.0000 [IU] | Freq: Once | INTRAVENOUS | Status: AC
  Administered 2022-10-01: 500 [IU] via INTRAVENOUS

## 2022-10-01 MED ORDER — SODIUM CHLORIDE 0.9% FLUSH
10.0000 mL | Freq: Once | INTRAVENOUS | Status: AC
  Administered 2022-10-01: 10 mL via INTRAVENOUS

## 2022-10-01 MED ORDER — ZOLPIDEM TARTRATE 5 MG PO TABS: 5.0000 mg | ORAL_TABLET | Freq: Every evening | ORAL | 0 refills | Status: DC | PRN

## 2022-10-01 MED ORDER — ALPRAZOLAM 0.25 MG PO TABS: 0.2500 mg | ORAL_TABLET | Freq: Two times a day (BID) | ORAL | 0 refills | Status: DC | PRN

## 2022-10-01 NOTE — Addendum Note (Signed)
Addended by: Geralynn Ochs on: 10/01/2022 04:28 PM   Modules accepted: Orders

## 2022-10-01 NOTE — Patient Instructions (Signed)
[facility] [place] **   You were seen today by Tarri Abernethy [name] for your symptom management visit.    MEMORY ISSUES You are on several medications that can cause problems with your memory. We will REDUCE the dose of your medications as follows: Xanax 0.25 mg twice daily - do NOT take your Xanax at the same time as your Ambien Ambien 5 mg (1 tablet) at bedtime  LOW BLOOD PRESSURE Make sure that you are taking your midodrine 3 times a day (breakfast, lunch, and dinner). Check your blood pressure 3 times each day and keep it written down to bring to your next appointment.  FOLLOW-UP APPOINTMENT: You are scheduled for office visit with Dr. Delton Coombes next week on 10/08/2022  ** Thank you for trusting me with your healthcare!  I strive to provide all of my patients with quality care at each visit.  If you receive a survey for this visit, I would be so grateful to you for taking the time to provide feedback.  Thank you in advance!  ~ Neyra Pettie                   Dr. [name]   &   Tarri Abernethy, [name]   - - - - - - - - - - - - - - - - - -    Thank you for choosing [facility] to provide your oncology and hematology care.  To afford each patient quality time with our provider, please arrive at least 15 minutes before your scheduled appointment time.   If you have a lab appointment with the [facility] please come in thru the Main Entrance and check in at the main information desk.  You need to re-schedule your appointment should you arrive 10 or more minutes late.  We strive to give you quality time with our providers, and arriving late affects you and other patients whose appointments are after yours.  Also, if you no show three or more times for appointments you may be dismissed from the clinic at the providers discretion.     Again, thank you for choosing [facility].  Our hope is that these requests will decrease the amount of time that you wait before being seen by our physicians.       _____________________________________________________________  Should you have questions after your visit to [facility], please contact our office at [phone] and follow the prompts.  Our office hours are 8:00 a.m. and 4:30 p.m. Monday - Friday.  Please note that voicemails left after 4:00 p.m. may not be returned until the following business day.  We are closed weekends and major holidays.  You do have access to a nurse 24-7, just call the main number to the clinic [phone] and do not press any options, hold on the line and a nurse will answer the phone.    For prescription refill requests, have your pharmacy contact our office and allow 72 hours.

## 2022-10-01 NOTE — Progress Notes (Signed)
[place] [address] [place] [address] Phone: [phone] Fax: [place] PROGRESS NOTE   Alexandra Price [id number] [birth year] 77 y.o.   INTERVAL HISTORY:  Chief Complaint: Memory deficits and labile blood pressure  Alexandra Price is managed by Dr. Delton Coombes for multiple myeloma.  Patient incorrectly thought that she was scheduled for appoint with Dr. Delton Coombes today and arrived at clinic with some limited confusion.  Therefore, she was added as a symptom management visit this afternoon to address some concerns regarding memory deficits.  Per clinic staff, patient has been usually "very on top of her treatment plans and appointments," but has been struggling with this for the past 2 to 3 months.  Patient admits to being confused about her appointment today and recognized this as she came on the wrong day.  She reports that she "often gets confused about days," but is not sure how long that has been going on.  She notices increasing forgetfulness around the house, which she feels is worse in the evenings.  Ms. Obenauf was also recently hospitalized from 09/17/2022 through 09/21/2022 for dizziness, falls, and orthostatic hypotension.  She was recommended for rehab at SNF, but left AMA.  She has a prescription to take midodrine 3 times daily, but reports that she thought it was only twice a day until today.  Her blood pressure at home ranges from 95/60-130/70.  Pertinent medications at home includes Xanax 0.5 mg twice daily ("first thing in the morning and at bedtime"), hydrocodone 4 times daily, and Ambien 10 mg each night for the past 3 years (although she reports it is not helping her to get any better sleep than she was before).  Patient reports that she keeps her medications in precounted pillboxes so she does not believe she is doubling up on doses due to forgetfulness.  She was alert and oriented x 3, and able to name  current president during visit.    ASSESSMENT & PLAN:  1.  Memory issues and frequent falls - Memory changes, frequent falls, and cognitive issues progressive over the past 2 to 3 months (started around November 2023) - Patient started new treatment regimen for multiple myeloma (Darzalex, Velcade, dexamethasone) on 06/11/2022 - DISCUSSION & DIFFERENTIAL DIAGNOSIS: Since cognitive changes seem to occur after starting her new treatment regimen for myeloma, medication effect must be considered.  Velcade has been associated with rare cases of PRES.  Although patient's symptoms are milder than the typical PRES presentation, she has not yet had MRI of the brain to rule out any focal reversible vasogenic edema. Patient is on multiple medications with potential cognitive effects, particularly for adults >65.  (She is taking Ambien 10 mg nightly, but maximum recommended dose for geriatric patients is 5 mg nightly.  She takes this in addition to Xanax and hydrocodone.) - PLAN: We will check MRI brain with and without contrast. - We will decrease Ambien to 5 mg nightly. - Xanax decreased to 0.25 mg twice daily. - If MRI negative and no improvement in symptoms after medication changes, would strongly consider referral to neurology. - Could also consider lumbar puncture in the future. - Patient is scheduled for follow-up visit with Dr. Delton Coombes next week.  2.  Orthostatic hypotension - Recently hospitalized from 09/17/2022 through 09/21/2022 for dizziness, falls, and orthostatic hypotension.  She was recommended for rehab at SNF, but left AMA. - She has prescription to take midodrine 3 times daily, but reports that  she thought it was only twice a day until today.  Her blood pressure at home ranges from 95/60-130/70. -There has been some association with Velcade causing low blood pressure - PLAN: Educated to take midodrine 3 times daily with meals. - Patient instructed to record home BP 2-3 times daily and  bring this with her to her next appointment.  PLAN SUMMARY: >> MRI brain with and without contrast >> Follow-up with Dr. Delton Coombes as scheduled on 10/08/2022    REVIEW OF SYSTEMS:   Review of Systems  Constitutional:  Negative for activity change, appetite change, chills, diaphoresis, fatigue, fever and unexpected weight change.  HENT:  Negative for mouth sores, nosebleeds, sore throat and trouble swallowing.   Respiratory:  Negative for cough and shortness of breath.   Cardiovascular:  Positive for chest pain (sometimes). Negative for palpitations and leg swelling.  Gastrointestinal:  Positive for nausea. Negative for abdominal pain, blood in stool, constipation, diarrhea and vomiting.  Genitourinary:  Positive for difficulty urinating. Negative for dysuria and hematuria.  Neurological:  Positive for dizziness. Negative for light-headedness, numbness and headaches.  Psychiatric/Behavioral:  Positive for confusion and sleep disturbance. Negative for dysphoric mood. The patient is not nervous/anxious.     Past Medical History, Surgical history, Social history, and Family history were reviewed as documented elsewhere in chart, and were updated as appropriate.   OBJECTIVE:  Physical Exam:  There were no vitals taken for this visit. ECOG: 1  Physical Exam Constitutional:      Appearance: Normal appearance.  HENT:     Head: Normocephalic and atraumatic.     Mouth/Throat:     Mouth: Mucous membranes are moist.  Eyes:     Extraocular Movements: Extraocular movements intact.     Pupils: Pupils are equal, round, and reactive to light.  Cardiovascular:     Rate and Rhythm: Normal rate and regular rhythm.     Pulses: Normal pulses.     Heart sounds: Normal heart sounds.  Pulmonary:     Effort: Pulmonary effort is normal.     Breath sounds: Normal breath sounds.  Abdominal:     General: Bowel sounds are normal.     Palpations: Abdomen is soft.     Tenderness: There is no abdominal  tenderness.  Musculoskeletal:        General: No swelling.     Right lower leg: No edema.     Left lower leg: No edema.  Lymphadenopathy:     Cervical: No cervical adenopathy.  Skin:    General: Skin is warm and dry.  Neurological:     General: No focal deficit present.     Mental Status: She is alert and oriented to person, place, and time.  Psychiatric:        Mood and Affect: Mood normal.        Behavior: Behavior normal.     Lab Review:     Component Value Date/Time   NA 138 10/01/2022 1457   K 3.7 10/01/2022 1457   CL 100 10/01/2022 1457   CO2 27 10/01/2022 1457   GLUCOSE 182 (H) 10/01/2022 1457   BUN 28 (H) 10/01/2022 1457   CREATININE 1.85 (H) 10/01/2022 1457   CALCIUM 9.2 10/01/2022 1457   PROT 6.6 10/01/2022 1457   ALBUMIN 3.9 10/01/2022 1457   AST 21 10/01/2022 1457   ALT 17 10/01/2022 1457   ALKPHOS 75 10/01/2022 1457   BILITOT 0.5 10/01/2022 1457   GFRNONAA 28 (L) 10/01/2022 1457  GFRAA 30 (L) 05/02/2020 0939       Component Value Date/Time   WBC 3.3 (L) 10/01/2022 1457   RBC 2.78 (L) 10/01/2022 1457   HGB 9.4 (L) 10/01/2022 1457   HGB 9.4 (L) 12/15/2018 1244   HCT 29.3 (L) 10/01/2022 1457   PLT 129 (L) 10/01/2022 1457   MCV 105.4 (H) 10/01/2022 1457   MCH 33.8 10/01/2022 1457   MCHC 32.1 10/01/2022 1457   RDW 13.8 10/01/2022 1457   LYMPHSABS 0.4 (L) 10/01/2022 1457   MONOABS 0.4 10/01/2022 1457   EOSABS 0.1 10/01/2022 1457   BASOSABS 0.0 10/01/2022 1457   -------------------------------  Imaging from last 24 hours (if applicable): Radiology interpretation: MR THORACIC SPINE W WO CONTRAST  Result Date: 09/20/2022 CLINICAL DATA:  Multiple myeloma.  Assess for Mets. EXAM: MRI THORACIC AND LUMBAR SPINE WITHOUT AND WITH CONTRAST TECHNIQUE: Multiplanar and multiecho pulse sequences of the thoracic and lumbar spine were obtained without and with intravenous contrast. CONTRAST:  69m GADAVIST GADOBUTROL 1 MMOL/ML IV SOLN COMPARISON:  PET CT10/5/23, CT  C SPine 09/17/22 FINDINGS: MRI THORACIC SPINE FINDINGS Alignment:  Mildly exaggerated thoracic kyphosis. Vertebrae: No evidence of an acute compression deformity. Vertebral body heights are preserved. There are mild degenerative endplate changes. In the visualized cervical spine there is a T2 hyperintense lesion along the superior endplate of C7 (series 21, image 11), which is favored to represent a myelomatous lesion. -there is a contrast-enhancing lesion in the posterior elements of T11 (series 25, image 12), favored to represent a myelomatous lesion. Cord:  Normal signal and morphology. Paraspinal and other soft tissues: Negative Disc levels: There are small disc bulges at the T10-T11 and T12-L1 levels resulting in mild spinal canal stenosis. No evidence of high-grade spinal canal stenosis. No evidence of high-grade neural foraminal stenosis. MRI LUMBAR SPINE FINDINGS Segmentation:  Standard. Alignment:  Physiologic. Vertebrae: Mild mid vertebral body height loss at L4 is favored to be degenerative. No evidence of discitis or osteomyelitis. There is a small punctate focal contrast-enhancing lesion along the inferior endplate of L1 which could be degenerative or represent a small myelomatous lesion. Conus medullaris: Extends to the L1 level and appears normal. Paraspinal and other soft tissues: There is atrophy of the paraspinal musculature bilaterally. The urinary bladder is markedly distended. Disc levels: There is multilevel degenerative disc disease with a right paracentral disc protrusion at T12-L1, a circumferential disc bulge at L1-L2, L2-L3, L3-L4, and L4-L5. The disc bulges results in mild spinal canal narrowing. There is no evidence of high-grade neural foraminal stenosis. IMPRESSION: 1. Small contrast-enhancing lesions in the posterior elements of T11 and possibly along the superior endplate of C7 are favored to represent myelomatous lesions. 2. Punctate contrast-enhancing lesion along the inferior  endplate of L1 could be degenerative or represent a small myelomatous lesion. 3. Multilevel degenerative changes in the thoracolumbar spine without evidence of high grade spinal canal or neuroforaminal stenosis. Electronically Signed   By: HMarin RobertsM.D.   On: 09/20/2022 09:29   MR Lumbar Spine W Wo Contrast  Result Date: 09/20/2022 CLINICAL DATA:  Multiple myeloma.  Assess for Mets. EXAM: MRI THORACIC AND LUMBAR SPINE WITHOUT AND WITH CONTRAST TECHNIQUE: Multiplanar and multiecho pulse sequences of the thoracic and lumbar spine were obtained without and with intravenous contrast. CONTRAST:  924mGADAVIST GADOBUTROL 1 MMOL/ML IV SOLN COMPARISON:  PET CT10/5/23, CT C SPine 09/17/22 FINDINGS: MRI THORACIC SPINE FINDINGS Alignment:  Mildly exaggerated thoracic kyphosis. Vertebrae: No evidence of an acute compression deformity.  Vertebral body heights are preserved. There are mild degenerative endplate changes. In the visualized cervical spine there is a T2 hyperintense lesion along the superior endplate of C7 (series 21, image 11), which is favored to represent a myelomatous lesion. -there is a contrast-enhancing lesion in the posterior elements of T11 (series 25, image 12), favored to represent a myelomatous lesion. Cord:  Normal signal and morphology. Paraspinal and other soft tissues: Negative Disc levels: There are small disc bulges at the T10-T11 and T12-L1 levels resulting in mild spinal canal stenosis. No evidence of high-grade spinal canal stenosis. No evidence of high-grade neural foraminal stenosis. MRI LUMBAR SPINE FINDINGS Segmentation:  Standard. Alignment:  Physiologic. Vertebrae: Mild mid vertebral body height loss at L4 is favored to be degenerative. No evidence of discitis or osteomyelitis. There is a small punctate focal contrast-enhancing lesion along the inferior endplate of L1 which could be degenerative or represent a small myelomatous lesion. Conus medullaris: Extends to the L1 level and  appears normal. Paraspinal and other soft tissues: There is atrophy of the paraspinal musculature bilaterally. The urinary bladder is markedly distended. Disc levels: There is multilevel degenerative disc disease with a right paracentral disc protrusion at T12-L1, a circumferential disc bulge at L1-L2, L2-L3, L3-L4, and L4-L5. The disc bulges results in mild spinal canal narrowing. There is no evidence of high-grade neural foraminal stenosis. IMPRESSION: 1. Small contrast-enhancing lesions in the posterior elements of T11 and possibly along the superior endplate of C7 are favored to represent myelomatous lesions. 2. Punctate contrast-enhancing lesion along the inferior endplate of L1 could be degenerative or represent a small myelomatous lesion. 3. Multilevel degenerative changes in the thoracolumbar spine without evidence of high grade spinal canal or neuroforaminal stenosis. Electronically Signed   By: Marin Roberts M.D.   On: 09/20/2022 09:29   ECHOCARDIOGRAM COMPLETE  Result Date: 09/18/2022    ECHOCARDIOGRAM REPORT   Patient Name:   TRU SAINZ Date of Exam: 09/18/2022 Medical Rec #:  [id number]       Height:       63.0 in Accession #:    [id number]      Weight:       169.8 lb Date of Birth:  1946/02/16       BSA:          1.803 m Patient Age:    22 years        BP:           132/80 mmHg Patient Gender: F               HR:           79 bpm. Exam Location:  [name] Procedure: 2D Echo, Cardiac Doppler and Color Doppler Indications:    R55 Syncope  History:        Patient has prior history of Echocardiogram examinations, most                 recent 05/28/2018. Risk Factors:Hypertension, Diabetes and                 Dyslipidemia. Multiple myeloma not having achieved remission,                 Acute renal failure superimposed on stage 3b chronic kidney                 disease.  Sonographer:    Alvino Chapel RCS Referring Phys: [id number] [place]  1. Left ventricular ejection fraction, by  estimation, is 55 to 60%. The left ventricle has normal function. The left ventricle has no regional wall motion abnormalities. Left ventricular diastolic parameters are consistent with Grade I diastolic dysfunction (impaired relaxation).  2. Right ventricular systolic function is normal. The right ventricular size is normal. Tricuspid regurgitation signal is inadequate for assessing PA pressure.  3. Left atrial size was mild to moderately dilated.  4. The mitral valve is abnormal. Trivial mitral valve regurgitation. No evidence of mitral stenosis.  5. The aortic valve is tricuspid. There is mild calcification of the aortic valve. Aortic valve regurgitation is not visualized. No aortic stenosis is present.  6. The inferior vena cava is normal in size with greater than 50% respiratory variability, suggesting right atrial pressure of 3 mmHg. Comparison(s): No prior Echocardiogram. FINDINGS  Left Ventricle: Left ventricular ejection fraction, by estimation, is 55 to 60%. The left ventricle has normal function. The left ventricle has no regional wall motion abnormalities. The left ventricular internal cavity size was normal in size. There is  no left ventricular hypertrophy. Left ventricular diastolic parameters are consistent with Grade I diastolic dysfunction (impaired relaxation). Right Ventricle: The right ventricular size is normal. No increase in right ventricular wall thickness. Right ventricular systolic function is normal. Tricuspid regurgitation signal is inadequate for assessing PA pressure. Left Atrium: Left atrial size was mild to moderately dilated. Right Atrium: Right atrial size was normal in size. Pericardium: There is no evidence of pericardial effusion. Mitral Valve: The mitral valve is abnormal. Mild to moderate mitral annular calcification. Trivial mitral valve regurgitation. No evidence of mitral valve stenosis. Tricuspid Valve: The tricuspid valve is not well visualized. Tricuspid valve  regurgitation is not demonstrated. No evidence of tricuspid stenosis. Aortic Valve: The aortic valve is tricuspid. There is mild calcification of the aortic valve. Aortic valve regurgitation is not visualized. No aortic stenosis is present. Pulmonic Valve: The pulmonic valve was not well visualized. Pulmonic valve regurgitation is not visualized. No evidence of pulmonic stenosis. Aorta: The aortic root is normal in size and structure. Venous: The inferior vena cava is normal in size with greater than 50% respiratory variability, suggesting right atrial pressure of 3 mmHg. IAS/Shunts: No atrial level shunt detected by color flow Doppler.  LEFT VENTRICLE PLAX 2D LVIDd:         4.60 cm   Diastology LVIDs:         2.90 cm   LV e' medial:    7.62 cm/s LV PW:         0.90 cm   LV E/e' medial:  12.7 LV IVS:        0.90 cm   LV e' lateral:   8.81 cm/s LVOT diam:     1.90 cm   LV E/e' lateral: 11.0 LV SV:         87 LV SV Index:   48 LVOT Area:     2.84 cm  RIGHT VENTRICLE RV S prime:     13.60 cm/s TAPSE (M-mode): 2.0 cm LEFT ATRIUM             Index        RIGHT ATRIUM           Index LA diam:        3.60 cm 2.00 cm/m   RA Area:     11.90 cm LA Vol (A2C):   75.3 ml 41.75 ml/m  RA Volume:   25.80 ml  14.31 ml/m LA Vol (A4C):   58.1  ml 32.22 ml/m LA Biplane Vol: 69.2 ml 38.37 ml/m  AORTIC VALVE LVOT Vmax:   153.00 cm/s LVOT Vmean:  95.800 cm/s LVOT VTI:    0.306 m  AORTA Ao Root diam: 3.20 cm MITRAL VALVE MV Area (PHT): 3.37 cm     SHUNTS MV Decel Time: 225 msec     Systemic VTI:  0.31 m MV E velocity: 96.80 cm/s   Systemic Diam: 1.90 cm MV A velocity: 115.00 cm/s MV E/A ratio:  0.84 Vishnu Priya Mallipeddi Electronically signed by Lorelee Cover Mallipeddi Signature Date/Time: 09/18/2022/1:47:41 PM    Final    CT Head Wo Contrast  Result Date: 09/17/2022 CLINICAL DATA:  Head trauma, intracranial injury suspected. Patient became dizzy and fell face first on the floor. EXAM: CT HEAD WITHOUT CONTRAST CT MAXILLOFACIAL  WITHOUT CONTRAST CT CERVICAL SPINE WITHOUT CONTRAST TECHNIQUE: Multidetector CT imaging of the head, cervical spine, and maxillofacial structures were performed using the standard protocol without intravenous contrast. Multiplanar CT image reconstructions of the cervical spine and maxillofacial structures were also generated. RADIATION DOSE REDUCTION: This exam was performed according to the departmental dose-optimization program which includes automated exposure control, adjustment of the mA and/or kV according to patient size and/or use of iterative reconstruction technique. COMPARISON:  None Available. FINDINGS: CT HEAD FINDINGS Brain: No evidence of acute infarction, hemorrhage, hydrocephalus, extra-axial collection or mass lesion/mass effect. Patchy areas of low-attenuation suggesting moderate chronic microvascular ischemic changes. Vascular: No hyperdense vessel or unexpected calcification. Skull: Multiple calvarial lucencies consistent with history of myeloma. Negative for fracture or focal lesion. Other: None. CT MAXILLOFACIAL FINDINGS Osseous: Mild irregularity of bilateral nasal bones with surrounding soft tissue swelling suggesting nondisplaced fracture. Orbits: Negative. No traumatic or inflammatory finding. Sinuses: Clear. Soft tissues: Soft tissue swelling about the nose. No drainable fluid collection or hematoma. CT CERVICAL SPINE FINDINGS Alignment: Normal. Skull base and vertebrae: Multiple lucencies of the cervical vertebral bodies without evidence of acute fracture. Soft tissues and spinal canal: No prevertebral fluid or swelling. No visible canal hematoma. Disc levels: Multilevel degenerate disc disease with disc height loss. Multilevel facet joint arthropathy. Upper chest: Biapical pleural/parenchymal scarring. Other: None IMPRESSION: CT HEAD: 1. No acute intracranial abnormality. 2. Multiple calvarial lucencies consistent with history of myeloma. CT MAXILLOFACIAL: Mild irregularity of bilateral  nasal bones with surrounding soft tissue swelling suggesting nondisplaced fracture. CT CERVICAL SPINE: 1. No acute fracture or traumatic subluxation. 2. Multilevel degenerate disc disease and facet joint arthropathy. 3. Multiple lucencies of the cervical vertebral bodies consistent with history of myeloma. Electronically Signed   By: Keane Police D.O.   On: 09/17/2022 13:26   CT Cervical Spine Wo Contrast  Result Date: 09/17/2022 CLINICAL DATA:  Head trauma, intracranial injury suspected. Patient became dizzy and fell face first on the floor. EXAM: CT HEAD WITHOUT CONTRAST CT MAXILLOFACIAL WITHOUT CONTRAST CT CERVICAL SPINE WITHOUT CONTRAST TECHNIQUE: Multidetector CT imaging of the head, cervical spine, and maxillofacial structures were performed using the standard protocol without intravenous contrast. Multiplanar CT image reconstructions of the cervical spine and maxillofacial structures were also generated. RADIATION DOSE REDUCTION: This exam was performed according to the departmental dose-optimization program which includes automated exposure control, adjustment of the mA and/or kV according to patient size and/or use of iterative reconstruction technique. COMPARISON:  None Available. FINDINGS: CT HEAD FINDINGS Brain: No evidence of acute infarction, hemorrhage, hydrocephalus, extra-axial collection or mass lesion/mass effect. Patchy areas of low-attenuation suggesting moderate chronic microvascular ischemic changes. Vascular: No hyperdense vessel or unexpected calcification.  Skull: Multiple calvarial lucencies consistent with history of myeloma. Negative for fracture or focal lesion. Other: None. CT MAXILLOFACIAL FINDINGS Osseous: Mild irregularity of bilateral nasal bones with surrounding soft tissue swelling suggesting nondisplaced fracture. Orbits: Negative. No traumatic or inflammatory finding. Sinuses: Clear. Soft tissues: Soft tissue swelling about the nose. No drainable fluid collection or  hematoma. CT CERVICAL SPINE FINDINGS Alignment: Normal. Skull base and vertebrae: Multiple lucencies of the cervical vertebral bodies without evidence of acute fracture. Soft tissues and spinal canal: No prevertebral fluid or swelling. No visible canal hematoma. Disc levels: Multilevel degenerate disc disease with disc height loss. Multilevel facet joint arthropathy. Upper chest: Biapical pleural/parenchymal scarring. Other: None IMPRESSION: CT HEAD: 1. No acute intracranial abnormality. 2. Multiple calvarial lucencies consistent with history of myeloma. CT MAXILLOFACIAL: Mild irregularity of bilateral nasal bones with surrounding soft tissue swelling suggesting nondisplaced fracture. CT CERVICAL SPINE: 1. No acute fracture or traumatic subluxation. 2. Multilevel degenerate disc disease and facet joint arthropathy. 3. Multiple lucencies of the cervical vertebral bodies consistent with history of myeloma. Electronically Signed   By: Keane Police D.O.   On: 09/17/2022 13:26   CT Maxillofacial Wo Contrast  Result Date: 09/17/2022 CLINICAL DATA:  Head trauma, intracranial injury suspected. Patient became dizzy and fell face first on the floor. EXAM: CT HEAD WITHOUT CONTRAST CT MAXILLOFACIAL WITHOUT CONTRAST CT CERVICAL SPINE WITHOUT CONTRAST TECHNIQUE: Multidetector CT imaging of the head, cervical spine, and maxillofacial structures were performed using the standard protocol without intravenous contrast. Multiplanar CT image reconstructions of the cervical spine and maxillofacial structures were also generated. RADIATION DOSE REDUCTION: This exam was performed according to the departmental dose-optimization program which includes automated exposure control, adjustment of the mA and/or kV according to patient size and/or use of iterative reconstruction technique. COMPARISON:  None Available. FINDINGS: CT HEAD FINDINGS Brain: No evidence of acute infarction, hemorrhage, hydrocephalus, extra-axial collection or mass  lesion/mass effect. Patchy areas of low-attenuation suggesting moderate chronic microvascular ischemic changes. Vascular: No hyperdense vessel or unexpected calcification. Skull: Multiple calvarial lucencies consistent with history of myeloma. Negative for fracture or focal lesion. Other: None. CT MAXILLOFACIAL FINDINGS Osseous: Mild irregularity of bilateral nasal bones with surrounding soft tissue swelling suggesting nondisplaced fracture. Orbits: Negative. No traumatic or inflammatory finding. Sinuses: Clear. Soft tissues: Soft tissue swelling about the nose. No drainable fluid collection or hematoma. CT CERVICAL SPINE FINDINGS Alignment: Normal. Skull base and vertebrae: Multiple lucencies of the cervical vertebral bodies without evidence of acute fracture. Soft tissues and spinal canal: No prevertebral fluid or swelling. No visible canal hematoma. Disc levels: Multilevel degenerate disc disease with disc height loss. Multilevel facet joint arthropathy. Upper chest: Biapical pleural/parenchymal scarring. Other: None IMPRESSION: CT HEAD: 1. No acute intracranial abnormality. 2. Multiple calvarial lucencies consistent with history of myeloma. CT MAXILLOFACIAL: Mild irregularity of bilateral nasal bones with surrounding soft tissue swelling suggesting nondisplaced fracture. CT CERVICAL SPINE: 1. No acute fracture or traumatic subluxation. 2. Multilevel degenerate disc disease and facet joint arthropathy. 3. Multiple lucencies of the cervical vertebral bodies consistent with history of myeloma. Electronically Signed   By: Keane Police D.O.   On: 09/17/2022 13:26   US Abdomen Complete  Result Date: 09/14/2022 CLINICAL DATA:  Abdominal pain for 3 days with diarrhea. Hysterectomy and appendectomy. EXAM: ABDOMEN ULTRASOUND COMPLETE COMPARISON:  Abdominal ultrasound 05/04/2022. Abdominopelvic CT 09/14/2022. FINDINGS: Gallbladder: No gallstones or wall thickening visualized. No sonographic Murphy sign noted by  sonographer. Common bile duct: Diameter: 3 mm Liver: The panic echogenicity  is diffusely increased, consistent with steatosis as demonstrated on earlier CT. No focal abnormalities are identified. Portal vein is patent on color Doppler imaging with normal direction of blood flow towards the liver. IVC: No abnormality visualized. Pancreas: Largely obscured by bowel gas.  Seen on previous CT. Spleen: Size and appearance within normal limits. Right Kidney: Length: 8.3 cm. Mildly increased cortical echogenicity. No mass or hydronephrosis visualized. Left Kidney: Length: 10.3 cm. Mildly increased cortical echogenicity. No mass or hydronephrosis visualized. Abdominal aorta: No aneurysm visualized. Other findings: Study limited by body habitus. No ascites identified. IMPRESSION: 1. No acute abdominal process identified. 2. Hepatic steatosis. 3. Mildly increased renal cortical echogenicity, correlate for medical renal disease. Electronically Signed   By: Richardean Sale M.D.   On: 09/14/2022 14:30   CT ABDOMEN PELVIS WO CONTRAST  Result Date: 09/14/2022 CLINICAL DATA:  Acute right-sided abdominal pain. History of multiple myeloma. EXAM: CT ABDOMEN AND PELVIS WITHOUT CONTRAST TECHNIQUE: Multidetector CT imaging of the abdomen and pelvis was performed following the standard protocol without IV contrast. RADIATION DOSE REDUCTION: This exam was performed according to the departmental dose-optimization program which includes automated exposure control, adjustment of the mA and/or kV according to patient size and/or use of iterative reconstruction technique. COMPARISON:  August 04, 2022. FINDINGS: Lower chest: No acute abnormality. Hepatobiliary: No cholelithiasis or biliary dilatation is noted. Hepatic steatosis. Pancreas: Unremarkable. No pancreatic ductal dilatation or surrounding inflammatory changes. Spleen: Normal in size without focal abnormality. Adrenals/Urinary Tract: Adrenal glands are unremarkable. Kidneys are  normal, without renal calculi, focal lesion, or hydronephrosis. Bladder is unremarkable. Stomach/Bowel: Stomach is unremarkable. There is no evidence of bowel obstruction or inflammation. Status post appendectomy. Vascular/Lymphatic: Aortic atherosclerosis. No enlarged abdominal or pelvic lymph nodes. Reproductive: Status post hysterectomy. No adnexal masses. Other: No abdominal wall hernia or abnormality. No abdominopelvic ascites. Musculoskeletal: Old T11 and L4 fractures are noted. No acute fracture or spondylolisthesis is noted. There is again noted diffuse permeative osteopenia consistent with history of multiple myeloma. IMPRESSION: Hepatic steatosis. No acute abnormality seen in the abdomen or pelvis. Stable diffuse permeative osteopenia throughout the visualized skeleton consistent with history of multiple myeloma. Aortic Atherosclerosis (ICD10-I70.0). Electronically Signed   By: Marijo Conception M.D.   On: 09/14/2022 13:32   NM Pulmonary Perfusion  Result Date: 09/03/2022 CLINICAL DATA:  Rule out pulmonary embolism. Shortness of breath and chest pain. EXAM: NUCLEAR MEDICINE PERFUSION LUNG SCAN TECHNIQUE: Perfusion images were obtained in multiple projections after intravenous injection of radiopharmaceutical. Ventilation scans intentionally deferred if perfusion scan and chest x-ray adequate for interpretation during COVID 19 epidemic. RADIOPHARMACEUTICALS:  4.4 mCi Tc-102mMAA IV COMPARISON:  Chest radiograph from earlier today FINDINGS: No peripheral segmental perfusion defects identified bilaterally. Heterogeneity on the oblique and lateral projection images is favored to represent sequelae of non-uniform soft tissue attenuation artifact. IMPRESSION: 1. No evidence for acute pulmonary embolus. Electronically Signed   By: TKerby MoorsM.D.   On: 09/03/2022 15:47   CT Chest Wo Contrast  Result Date: 09/03/2022 CLINICAL DATA:  Respiratory illness.  Nondiagnostic x-ray. EXAM: CT CHEST WITHOUT  CONTRAST TECHNIQUE: Multidetector CT imaging of the chest was performed following the standard protocol without IV contrast. RADIATION DOSE REDUCTION: This exam was performed according to the departmental dose-optimization program which includes automated exposure control, adjustment of the mA and/or kV according to patient size and/or use of iterative reconstruction technique. COMPARISON:  October 13, 2020 FINDINGS: Cardiovascular: No significant vascular findings. Normal heart size. No pericardial effusion. Right  central venous line identified. Mediastinum/Nodes: No enlarged mediastinal or axillary lymph nodes. Trachea, and esophagus demonstrate no significant findings. 4 mm low-density left thyroid nodule is unchanged. Lungs/Pleura: Minimal scar bilaterally noted. Lungs are otherwise clear. No pleural effusion or pneumothorax. Upper Abdomen: Diffuse lucency of liver noted consistent with fatty infiltration of liver. Musculoskeletal: Diffuse lytic lesions throughout the marrow space consistent with clinical history of multiple myeloma unchanged. Degenerative joint changes of the spine. Chronic compression deformity of a lower thoracic vertebral body unchanged. IMPRESSION: 1. No acute abnormality of the chest.  No focal pneumonia. Electronically Signed   By: Abelardo Diesel M.D.   On: 09/03/2022 12:32   CT Head Wo Contrast  Result Date: 09/03/2022 CLINICAL DATA:  Mental status change, unknown cause. Patient complaining of dizziness, weakness, unsteadiness on her feet, arm heaviness in loss of appetite. History of multiple myeloma. EXAM: CT HEAD WITHOUT CONTRAST TECHNIQUE: Contiguous axial images were obtained from the base of the skull through the vertex without intravenous contrast. RADIATION DOSE REDUCTION: This exam was performed according to the departmental dose-optimization program which includes automated exposure control, adjustment of the mA and/or kV according to patient size and/or use of iterative  reconstruction technique. COMPARISON:  MRI brain 03/31/2021. FINDINGS: Brain: No midline shift, ventriculomegaly, mass effect, evidence of mass lesion, intracranial hemorrhage or evidence of cortically based acute infarction. Gray-white matter differentiation is within normal limits throughout the brain. Stable mild chronic small-vessel disease. Vascular: No hyperdense vessel. Skull: Numerous circumscribed, lytic lesions of the calvarium, compatible with known history of multiple myeloma. Sinuses/Orbits: Paranasal sinuses, mastoids and middle ear cavities are well aerated. Orbits are unremarkable. Other: None. IMPRESSION: No acute intracranial abnormalities. Electronically Signed   By: Emmit Alexanders M.D   On: 09/03/2022 12:25   DG Chest Port 1 View  Result Date: 09/03/2022 CLINICAL DATA:  Weakness EXAM: PORTABLE CHEST 1 VIEW COMPARISON:  Radiograph 08/14/2022 FINDINGS: Chest port catheter tip overlies the mid SVC. Unchanged cardiomediastinal silhouette. There is no focal airspace consolidation. There is no pleural effusion or evidence of pneumothorax. Chronic lower thoracic compression deformity. Chronic left clavicle injury with prominent callus formation. Chronic left upper rib injury. Thoracic spondylosis. IMPRESSION: No evidence of acute cardiopulmonary disease. Electronically Signed   By: Maurine Simmering M.D.   On: 09/03/2022 10:47      WRAP UP:  All questions were answered. The patient knows to call the clinic with any problems, questions or concerns.  Medical decision making: Moderate  Time spent on visit: I spent 25 minutes counseling the patient face to face. The total time spent in the appointment was 40 minutes and more than 50% was on counseling.  Harriett Rush, [name]  10/02/22 5:34 PM

## 2022-10-02 ENCOUNTER — Ambulatory Visit (HOSPITAL_COMMUNITY): Payer: 59 | Admitting: Physical Therapy

## 2022-10-02 ENCOUNTER — Encounter: Payer: Self-pay | Admitting: Hematology

## 2022-10-03 ENCOUNTER — Encounter: Payer: Self-pay | Admitting: Hematology

## 2022-10-04 ENCOUNTER — Inpatient Hospital Stay: Payer: 59

## 2022-10-04 ENCOUNTER — Ambulatory Visit: Payer: Medicare Other

## 2022-10-06 ENCOUNTER — Ambulatory Visit (HOSPITAL_COMMUNITY)
Admission: RE | Admit: 2022-10-06 | Discharge: 2022-10-06 | Disposition: A | Discharge status: Home / Self Care | Payer: 59 | Source: Ambulatory Visit | Attending: Physician Assistant | Admitting: Physician Assistant

## 2022-10-06 DIAGNOSIS — C9000 Multiple myeloma not having achieved remission: Secondary | ICD-10-CM | POA: Insufficient documentation

## 2022-10-06 MED ORDER — GADOBUTROL 1 MMOL/ML IV SOLN
8.0000 mL | Freq: Once | INTRAVENOUS | Status: AC | PRN
  Administered 2022-10-06: 8 mL via INTRAVENOUS

## 2022-10-08 ENCOUNTER — Encounter: Payer: 59 | Admitting: Dietician

## 2022-10-08 ENCOUNTER — Inpatient Hospital Stay: Payer: 59

## 2022-10-08 ENCOUNTER — Inpatient Hospital Stay (HOSPITAL_BASED_OUTPATIENT_CLINIC_OR_DEPARTMENT_OTHER): Payer: 59 | Admitting: Hematology

## 2022-10-08 ENCOUNTER — Inpatient Hospital Stay: Payer: 59 | Admitting: Dietician

## 2022-10-08 DIAGNOSIS — C9000 Multiple myeloma not having achieved remission: Secondary | ICD-10-CM

## 2022-10-08 LAB — CBC WITH DIFFERENTIAL/PLATELET
Abs Immature Granulocytes: 0.02 10*3/uL (ref 0.00–0.07)
Basophils Absolute: 0 10*3/uL (ref 0.0–0.1)
Basophils Relative: 1 %
Eosinophils Absolute: 0.1 10*3/uL (ref 0.0–0.5)
Eosinophils Relative: 2 %
HCT: 31.1 % — ABNORMAL LOW (ref 36.0–46.0)
Hemoglobin: 10 g/dL — ABNORMAL LOW (ref 12.0–15.0)
Immature Granulocytes: 0 %
Lymphocytes Relative: 6 %
Lymphs Abs: 0.3 10*3/uL — ABNORMAL LOW (ref 0.7–4.0)
MCH: 34.1 pg — ABNORMAL HIGH (ref 26.0–34.0)
MCHC: 32.2 g/dL (ref 30.0–36.0)
MCV: 106.1 fL — ABNORMAL HIGH (ref 80.0–100.0)
Monocytes Absolute: 0.3 10*3/uL (ref 0.1–1.0)
Monocytes Relative: 5 %
Neutro Abs: 4.5 10*3/uL (ref 1.7–7.7)
Neutrophils Relative %: 86 %
Platelets: 151 10*3/uL (ref 150–400)
RBC: 2.93 MIL/uL — ABNORMAL LOW (ref 3.87–5.11)
RDW: 13.6 % (ref 11.5–15.5)
WBC: 5.1 10*3/uL (ref 4.0–10.5)
nRBC: 0 % (ref 0.0–0.2)

## 2022-10-08 LAB — COMPREHENSIVE METABOLIC PANEL
ALT: 19 U/L (ref 0–44)
AST: 25 U/L (ref 15–41)
Albumin: 3.9 g/dL (ref 3.5–5.0)
Alkaline Phosphatase: 69 U/L (ref 38–126)
Anion gap: 12 (ref 5–15)
BUN: 28 mg/dL — ABNORMAL HIGH (ref 8–23)
CO2: 26 mmol/L (ref 22–32)
Calcium: 9.1 mg/dL (ref 8.9–10.3)
Chloride: 99 mmol/L (ref 98–111)
Creatinine, Ser: 1.75 mg/dL — ABNORMAL HIGH (ref 0.44–1.00)
GFR, Estimated: 30 mL/min — ABNORMAL LOW (ref >60)
Glucose, Bld: 326 mg/dL — ABNORMAL HIGH (ref 70–99)
Potassium: 3.7 mmol/L (ref 3.5–5.1)
Sodium: 137 mmol/L (ref 135–145)
Total Bilirubin: 0.8 mg/dL (ref 0.3–1.2)
Total Protein: 6.7 g/dL (ref 6.5–8.1)

## 2022-10-08 LAB — MAGNESIUM: Magnesium: 1.7 mg/dL (ref 1.7–2.4)

## 2022-10-08 MED ORDER — HEPARIN SOD (PORK) LOCK FLUSH 100 UNIT/ML IV SOLN
500.0000 [IU] | Freq: Once | INTRAVENOUS | Status: AC
  Administered 2022-10-08: 500 [IU] via INTRAVENOUS

## 2022-10-08 MED ORDER — SODIUM CHLORIDE 0.9% FLUSH
10.0000 mL | INTRAVENOUS | Status: DC | PRN
  Administered 2022-10-08: 10 mL

## 2022-10-08 MED ORDER — GLIPIZIDE 5 MG PO TABS: 5.0000 mg | ORAL_TABLET | Freq: Every day | ORAL | 5 refills | Status: DC

## 2022-10-08 MED ORDER — SODIUM CHLORIDE 0.9% FLUSH
10.0000 mL | Freq: Once | INTRAVENOUS | Status: AC
  Administered 2022-10-08: 10 mL via INTRAVENOUS

## 2022-10-08 NOTE — Progress Notes (Signed)
No treatment today, defer treatment for two weeks per Dr.K.

## 2022-10-08 NOTE — Progress Notes (Signed)
[facility] [address] [place] [address]    Clinic Day:  10/08/2022  Referring physician: [name], MD  Patient Care Team: [name], MD as PCP - General (Internal Medicine) [name], MD as Medical Oncologist (Medical Oncology) [name], DO as Consulting Physician (Gastroenterology)   ASSESSMENT & PLAN:   Assessment: 1.  IgG lambda plasma cell myeloma, stage II, standard risk: -4 cycles of KPD from 04/17/2018 through 08/21/2018, stem cell transplant on 10/23/2018. -PET scan on 01/21/2019 showed multiple bone lesions but without any hypermetabolic activity. -BMBX on 01/21/2019 with normocellular marrow with no increase in plasma cells.  Normal FISH.  MRD results negative. -Maintenance pomalidomide 2 mg 3 weeks on/1 week off started on 03/10/2019. -Bone marrow biopsy on 10/29/2019 shows trilineage hematopoiesis with no evidence of plasma cells.  Chromosome analysis and FISH are normal. -BM BX on 10/27/2020 at [facility] with 30 to 40%, TLH.  2% of the total cells are CD 138+ plasma cells. - Pomalyst was held due to dizziness from 03/29/2021 through 04/26/2021. - MRI of the brain on 03/31/2021 did not show any evidence of intracranial lesions.  Multiple bone lesions in the skull compatible with sequela of myeloma.  - Labs at [facility] on 11/16/2021: M spike 0.24 g.  Immunofixation positive for IgG lambda. - PET scan on 12/14/2021: No evidence of FDG avid osseous or soft tissue myeloma. - She has developed left shoulder blade and right posterior rib pain for the last 3 to 4 months. - 24-hour urine on 12/14/2021: Total protein 136 mg.  Immune immunofixation positive for lambda type Bence-Jones protein. -- PET scan (05/24/2022): Single new hypermetabolic lytic lesion involving left lamina of T11.  No canal encroachment.  No other hypermetabolic bone lesions.  Stable diffuse lytic lesions throughout the axial and appendicular skeleton.  No  soft tissue lesions. - Maintenance Pomalyst discontinued on 05/29/2022 - XRT to the T11 completed on 06/20/2022 - Daratumumab, Velcade and dexamethasone started on 06/11/2022   2.  Pulmonary embolism: -CT angiogram on 04/21/2020 showed filling defect at [facility]. -VQ scan confirmed pulmonary embolism. -She is on Eliquis.   3.  Right shoulder and upper arm pain: - She received XRT to the chest from 12/26/2020 through 01/06/2021.   4.  Myeloma bone disease: - Denosumab held due to exposure of bone in the right lower jaw.  Last dose in 2020.  Plan: 1.  IgG lambda plasma cell myeloma: - Myeloma panel 09/06/2022 with M spike of 0.1 g. - She has been off of therapy at this time. - She denies any recent falls.  She reports decreased taste since RSV infection. - Reviewed labs today which showed normal LFTs.  Creatinine is 1.7 500 baseline.  CBC shows improved hemoglobin to 10. - Reviewed MRI of the brain from 10/06/2022: No posterior reversible encephalopathy syndrome. - Will continue to hold treatment because of recent falls and confusion.  We have sent myeloma labs today.  We will see her back in 2 weeks for follow-up.  If she continues to be better, will consider restarting therapy at that time.   2.  Back pain/left shoulder blade pain/right lateral and posterior rib pains: - Continue hydrocodone 10/325 every 6 hours as needed.   3.  Hypomagnesemia: - Magnesium is normal today.  Continue magnesium twice daily.   4.  Anxiety: - Continue Xanax 0.5 mg twice daily.   5.  Sleeping difficulty: - Continue Ambien 10 mg  at bedtime as needed.  She does not want to decrease the dose.   6.  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  Hemoglobin improved to 10 today.   7.  Pulmonary embolism: - Continue Eliquis daily.  No bleeding issues.    Orders Placed This Encounter  Procedures   CBC with Differential    Standing Status:   Future    Standing  Expiration Date:   10/23/2023   Comprehensive metabolic panel    Standing Status:   Future    Standing Expiration Date:   10/23/2023      Beverly Gust Oliver,acting as a scribe for [name], MD.,have documented all relevant documentation on the behalf of [name], MD,as directed by  [name], MD while in the presence of [name], MD.   I, [name] MD, have reviewed the above documentation for accuracy and completeness, and I agree with the above.   [name], MD   2/19/20246:54 PM  CHIEF COMPLAINT:   Diagnosis: multiple myeloma    Cancer Staging  No matching staging information was found for the patient.   Prior Therapy: 1. KPD x 4 cycles from 04/17/2018 to 08/21/2018. 2. Stem cell transplant on 10/23/2018. 3. Radiation to left and right humerus 25 Gy in 10 fractions from 08/23/2020 to 08/25/2020. 4.  Maintenance Pomalyst until 06/08/2022  Current Therapy:  Darzalex, Velcade and dexamethasone started on 06/11/2022    HISTORY OF PRESENT ILLNESS:   Oncology History  Multiple myeloma not having achieved remission ([place])  04/07/2018 Initial Diagnosis   Multiple myeloma not having achieved remission ([place])   06/11/2022 -  Chemotherapy   Patient is on Treatment Plan : MYELOMA RELAPSED / REFRACTORY Daratumumab SQ + Bortezomib + Dexamethasone (DaraVd) q21d / Daratumumab SQ q28d      Multiple myeloma without remission ([place])  04/15/2018 Initial Diagnosis   Multiple myeloma without remission ([place])   04/17/2018 - 09/05/2018 Chemotherapy   The patient had dexamethasone (DECADRON) 4 MG tablet, 1 of 1 cycle, Start date: 04/15/2018, End date: 05/26/2018 palonosetron (ALOXI) injection 0.25 mg, 0.25 mg, Intravenous,  Once, 1 of 1 cycle Administration: 0.25 mg (04/17/2018), 0.25 mg (04/24/2018), 0.25 mg (05/01/2018) cyclophosphamide (CYTOXAN) 540 mg in sodium chloride 0.9 % 250 mL chemo infusion, 300 mg/m2 = 540 mg, Intravenous,  Once, 1 of 1  cycle Administration: 540 mg (04/17/2018), 540 mg (04/24/2018), 540 mg (05/01/2018) carfilzomib (KYPROLIS) 36 mg in dextrose 5 % 50 mL chemo infusion, 20 mg/m2 = 36 mg, Intravenous, Once, 5 of 5 cycles Administration: 36 mg (04/17/2018), 36 mg (04/18/2018), 60 mg (04/24/2018), 60 mg (04/25/2018), 60 mg (05/01/2018), 60 mg (05/02/2018), 60 mg (05/15/2018), 60 mg (05/16/2018), 60 mg (06/12/2018), 60 mg (06/13/2018), 60 mg (06/26/2018), 60 mg (06/27/2018), 60 mg (07/03/2018), 60 mg (07/04/2018), 60 mg (07/24/2018), 60 mg (07/25/2018), 60 mg (07/31/2018), 60 mg (08/01/2018), 60 mg (08/08/2018), 60 mg (08/07/2018), 60 mg (08/21/2018), 60 mg (08/22/2018), 60 mg (08/28/2018), 60 mg (08/29/2018), 60 mg (09/04/2018), 60 mg (09/05/2018)  for chemotherapy treatment.    06/11/2022 -  Chemotherapy   Patient is on Treatment Plan : MYELOMA RELAPSED / REFRACTORY Daratumumab SQ + Bortezomib + Dexamethasone (DaraVd) q21d / Daratumumab SQ q28d         INTERVAL HISTORY:   Alexandra Price is a 77 y.o. female presenting to clinic today for follow up of multiple myeloma . She was last seen by me on 09/24/2022.  Today, she states that she is doing well overall. Her appetite  level is at 100%. Her energy level is at 40%.She has 7/10 back pain. She continues to frequently have a bowl movement and overall feeling well. . She has not had an appetite due to RSV and no taste as a result of RSV also. She has last 5lbs since having RSV.    PAST MEDICAL HISTORY:   Past Medical History: Past Medical History:  Diagnosis Date   Anxiety    Chronic kidney disease    Depression    Hypertension    Multiple myeloma ([place])    multiple myeloma   Pre-diabetes     Surgical History: Past Surgical History:  Procedure Laterality Date   ABDOMINAL HYSTERECTOMY     total   APPENDECTOMY     BALLOON DILATION N/A 05/28/2022   Procedure: BALLOON DILATION;  Surgeon: [name], DO;  [facility];  Service: Endoscopy;  Laterality: N/A;   BIOPSY   05/28/2022   Procedure: BIOPSY;  Surgeon: [name], DO;  [facility];  Service: Endoscopy;;   COLONOSCOPY WITH PROPOFOL N/A 12/25/2021   Procedure: COLONOSCOPY WITH PROPOFOL;  Surgeon: [name], DO;  [facility];  Service: Endoscopy;  Laterality: N/A;  2:30pm   ESOPHAGOGASTRODUODENOSCOPY (EGD) WITH PROPOFOL N/A 05/28/2022   Procedure: ESOPHAGOGASTRODUODENOSCOPY (EGD) WITH PROPOFOL;  Surgeon: [name], DO;  [facility];  Service: Endoscopy;  Laterality: N/A;  10:00am, asa 3   LAPAROSCOPIC APPENDECTOMY N/A 05/20/2018   Procedure: APPENDECTOMY LAPAROSCOPIC;  Surgeon: [name], MD;  Location: [facility];  Service: General;  Laterality: N/A;   POLYPECTOMY  12/25/2021   Procedure: POLYPECTOMY;  Surgeon: [name], DO;  [facility];  Service: Endoscopy;;   PORTACATH PLACEMENT Right 04/14/2018   Procedure: INSERTION PORT-A-CATH;  Surgeon: [name], MD;  Location: [facility];  Service: General;  Laterality: Right;    Social History: Social History   Socioeconomic History   Marital status: Divorced    Spouse name: Not on file   Number of children: 6   Years of education: Not on file   Highest education level: Not on file  Occupational History    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  Tobacco Use   Smoking status: Never   Smokeless tobacco: Never  Vaping Use   Vaping Use: Never used  Substance and Sexual Activity   Alcohol use: Never   Drug use: Not Currently   Sexual activity: Not Currently  Other Topics Concern   Not on file  Social History Narrative   Not on file   Social Determinants of Health   Financial Resource Strain: Low Risk  (07/06/2020)   Overall Financial Resource Strain (CARDIA)    Difficulty of Paying Living Expenses: Not hard at all  Food Insecurity: No Food Insecurity (09/17/2022)   Hunger Vital Sign    Worried About Running Out of Food in the Last Year: Never true    [place] in the  Last Year: Never true  Transportation Needs: No Transportation Needs (09/17/2022)   PRAPARE - Hydrologist (Medical): No    Lack of Transportation (Non-Medical): No  Physical Activity: Inactive (07/06/2020)   Exercise Vital Sign    Days of Exercise per Week: 0 days    Minutes of Exercise per Session: 0 min  Stress: No Stress Concern Present (07/06/2020)   [place]    Feeling of Stress : Not at all  Social Connections: Moderately Isolated (07/06/2020)   Social Connection and Isolation Panel [NHANES]    Frequency of Communication with Friends and Family: More than three times a week    Frequency of Social Gatherings with Friends and Family: Twice a week    Attends Religious Services: 1 to 4 times per year    Active Member of [employer] or Organizations: No    Attends Archivist Meetings: Never    Marital Status: Separated  Intimate Partner Violence: Not At Risk (09/17/2022)   Humiliation, Afraid, Rape, and Kick questionnaire    Fear of Current or Ex-Partner: No    Emotionally Abused: No    Physically Abused: No    Sexually Abused: No    Family History: Family History  Problem Relation Age of Onset   Heart disease Mother    Emphysema Father    Diabetes Sister    Depression Sister    Cancer Brother        liver, lung, and colon. colon cancer at age 62.   Diabetes Brother     Current Medications:  Current Outpatient Medications:    acetaminophen (TYLENOL) 325 MG tablet, Take 650 mg by mouth as needed for moderate pain (prior to infusion)., Disp: , Rfl:    acyclovir (ZOVIRAX) 400 MG tablet, Take 1 tablet (400 mg total) by mouth 2 (two) times daily., Disp: 60 tablet, Rfl: 6   albuterol (PROVENTIL) (2.5 MG/3ML) 0.083% nebulizer solution, Take 3 mLs (2.5 mg total) by nebulization every 4 (four) hours as needed for wheezing or shortness of breath., Disp: 75 mL, Rfl: 2   albuterol  (VENTOLIN HFA) 108 (90 Base) MCG/ACT inhaler, Inhale 2 puffs into the lungs every 4 (four) hours as needed for wheezing or shortness of breath., Disp: 18 g, Rfl: 2   ALPRAZolam (XANAX) 0.25 MG tablet, Take 1 tablet (0.25 mg total) by mouth 2 (two) times daily as needed for anxiety., Disp: 30 tablet, Rfl: 0   Calcium Carb-Cholecalciferol (CALCIUM 1000 + D PO), Take 1,000 mg by mouth daily., Disp: , Rfl:    chlorthalidone (HYGROTON) 25 MG tablet, Take 25 mg by mouth every morning., Disp: , Rfl:    dexAMETHasone 20 MG TABS, Take 1 tablet by mouth as directed. Take 1 tablet 30 minutes prior to infusions, Disp: 30 tablet, Rfl: 2   dextromethorphan-guaiFENesin (MUCINEX DM) 30-600 MG 12hr tablet, Take 1 tablet by mouth 2 (two) times daily., Disp: 20 tablet, Rfl: 0   diphenhydrAMINE (BENADRYL) 25 MG tablet, Take 50 mg by mouth as needed (prior to infusion)., Disp: , Rfl:    ELIQUIS 2.5 MG TABS tablet, Take 2.5 mg by mouth 2 (two) times daily., Disp: , Rfl:    escitalopram (LEXAPRO) 20 MG tablet, Take 20 mg by mouth daily., Disp: , Rfl:    glipiZIDE (GLUCOTROL) 5 MG tablet, Take 1 tablet (5 mg total) by mouth daily before breakfast., Disp: 30 tablet, Rfl: 5   HYDROcodone-acetaminophen (NORCO) 10-325 MG tablet, Take 1 tablet by mouth every 6 (six) hours as needed., Disp: 120 tablet, Rfl: 0   magnesium oxide (MAG-OX) 400 (240 Mg) MG tablet, Take 1 tablet (400 mg total) by mouth 2 (two) times daily., Disp: 60 tablet, Rfl: 4   methocarbamol (ROBAXIN) 500 MG tablet, Take 1 tablet (500 mg total) by mouth every 8 (eight) hours as needed for muscle spasms (back pain)., Disp: 20 tablet, Rfl: 0   midodrine (PROAMATINE) 2.5 MG tablet, Take 1 tablet (2.5 mg total) by mouth 3 (  three) times daily with meals., Disp: 90 tablet, Rfl: 1   montelukast (SINGULAIR) 10 MG tablet, TAKE 1 TABLET BY MOUTH AS DIRECTED 30 MINUTES PRIOR TO TREATMENT (Patient taking differently: Take 10 mg by mouth See admin instructions. Take 1 tablet by  mouth as directed 30 minutes prior to treatment), Disp: 90 tablet, Rfl: 0   Multiple Vitamin (MULTI VITAMIN) TABS, Take 1 tablet by mouth daily., Disp: , Rfl:    Nebulizers (COMPRESSOR/NEBULIZER) MISC, 1 Units by Does not apply route 3 (three) times daily as needed., Disp: 1 each, Rfl: 0   pantoprazole (PROTONIX) 40 MG tablet, Take 1 tablet (40 mg total) by mouth daily., Disp: 90 tablet, Rfl: 6   polyethylene glycol (MIRALAX / GLYCOLAX) 17 g packet, Take 17 g by mouth daily as needed for moderate constipation., Disp: 14 each, Rfl: 0   rosuvastatin (CRESTOR) 10 MG tablet, Take 10 mg by mouth daily., Disp: , Rfl:    zolpidem (AMBIEN) 5 MG tablet, Take 1 tablet (5 mg total) by mouth at bedtime as needed for sleep., Disp: 30 tablet, Rfl: 0   prochlorperazine (COMPAZINE) 10 MG tablet, Take 1 tablet (10 mg total) by mouth every 6 (six) hours as needed for nausea or vomiting. (Patient not taking: Reported on 10/08/2022), Disp: 30 tablet, Rfl: 0   Allergies: Allergies  Allergen Reactions   Ciprofloxacin Anaphylaxis   Amoxicillin Other (See Comments)    unknown   Morphine And Related Nausea And Vomiting   Augmentin [Amoxicillin-Pot Clavulanate] Other (See Comments)    Headache, insomnia    REVIEW OF SYSTEMS:   Review of Systems  Constitutional:  Negative for chills, fatigue and fever.  HENT:   Negative for lump/mass, mouth sores, nosebleeds, sore throat and trouble swallowing.   Eyes:  Negative for eye problems.  Respiratory:  Negative for cough and shortness of breath.   Cardiovascular:  Negative for chest pain, leg swelling and palpitations.  Gastrointestinal:  Positive for nausea. Negative for abdominal pain, constipation, diarrhea and vomiting.  Genitourinary:  Negative for bladder incontinence, difficulty urinating, dysuria, frequency, hematuria and nocturia.   Musculoskeletal:  Negative for arthralgias, back pain, flank pain, myalgias and neck pain.  Skin:  Negative for itching and rash.   Neurological:  Positive for dizziness, light-headedness and numbness (Fingers). Negative for headaches.  Hematological:  Does not bruise/bleed easily.  Psychiatric/Behavioral:  Positive for depression and sleep disturbance. Negative for suicidal ideas. The patient is not nervous/anxious.   All other systems reviewed and are negative.    VITALS:   There were no vitals taken for this visit.  Wt Readings from Last 3 Encounters:  10/08/22 165 lb 6.4 oz (75 kg)  09/24/22 170 lb 4.8 oz (77.2 kg)  09/20/22 172 lb 2.9 oz (78.1 kg)    There is no height or weight on file to calculate BMI.  Performance status (ECOG): 1 - Symptomatic but completely ambulatory  PHYSICAL EXAM:   Physical Exam Vitals and nursing note reviewed. Exam conducted with a chaperone present.  Constitutional:      Appearance: Normal appearance.  Cardiovascular:     Rate and Rhythm: Normal rate and regular rhythm.     Pulses: Normal pulses.     Heart sounds: Normal heart sounds.  Pulmonary:     Effort: Pulmonary effort is normal.     Breath sounds: Normal breath sounds.  Abdominal:     Palpations: Abdomen is soft. There is no hepatomegaly, splenomegaly or mass.     Tenderness: There  is no abdominal tenderness.  Musculoskeletal:     Right lower leg: No edema.     Left lower leg: No edema.  Lymphadenopathy:     Cervical: No cervical adenopathy.     Right cervical: No superficial, deep or posterior cervical adenopathy.    Left cervical: No superficial, deep or posterior cervical adenopathy.     Upper Body:     Right upper body: No supraclavicular or axillary adenopathy.     Left upper body: No supraclavicular or axillary adenopathy.  Neurological:     General: No focal deficit present.     Mental Status: She is alert and oriented to person, place, and time.  Psychiatric:        Mood and Affect: Mood normal.        Behavior: Behavior normal.     LABS:      Latest Ref Rng & Units 10/08/2022    9:09 AM  10/01/2022    2:57 PM 09/24/2022    9:01 AM  CBC  WBC 4.0 - 10.5 K/uL 5.1  3.3  3.6   Hemoglobin 12.0 - 15.0 g/dL 10.0  9.4  8.8   Hematocrit 36.0 - 46.0 % 31.1  29.3  27.4   Platelets 150 - 400 K/uL 151  129  127       Latest Ref Rng & Units 10/08/2022    9:09 AM 10/01/2022    2:57 PM 09/24/2022    9:01 AM  CMP  Glucose 70 - 99 mg/dL 326  182  185   BUN 8 - 23 mg/dL 28  28  19   $ Creatinine 0.44 - 1.00 mg/dL 1.75  1.85  1.54   Sodium 135 - 145 mmol/L 137  138  135   Potassium 3.5 - 5.1 mmol/L 3.7  3.7  3.2   Chloride 98 - 111 mmol/L 99  100  100   CO2 22 - 32 mmol/L 26  27  26   $ Calcium 8.9 - 10.3 mg/dL 9.1  9.2  8.8   Total Protein 6.5 - 8.1 g/dL 6.7  6.6  6.0   Total Bilirubin 0.3 - 1.2 mg/dL 0.8  0.5  0.6   Alkaline Phos 38 - 126 U/L 69  75  63   AST 15 - 41 U/L 25  21  20   $ ALT 0 - 44 U/L 19  17  17      $ No results found for: "CEA1", "CEA" / No results found for: "CEA1", "CEA" No results found for: "PSA1" No results found for: "CAN199" No results found for: "CAN125"  Lab Results  Component Value Date   TOTALPROTELP 5.7 (L) 09/06/2022   TOTALPROTELP 5.6 (L) 09/06/2022   ALBUMINELP 3.2 09/06/2022   A1GS 0.3 09/06/2022   A2GS 0.9 09/06/2022   BETS 0.8 09/06/2022   GAMS 0.5 09/06/2022   MSPIKE 0.2 (H) 09/06/2022   SPEI Comment 09/06/2022   Lab Results  Component Value Date   TIBC 290 09/17/2022   TIBC 278 07/30/2022   TIBC 272 05/24/2022   FERRITIN 1,059 (H) 09/17/2022   FERRITIN 313 (H) 07/30/2022   FERRITIN 435 (H) 05/24/2022   IRONPCTSAT 10 (L) 09/17/2022   IRONPCTSAT 21 07/30/2022   IRONPCTSAT 27 05/24/2022   Lab Results  Component Value Date   LDH 129 05/24/2022   LDH 135 03/20/2022   LDH 129 12/14/2021     STUDIES:   MR Brain W Wo Contrast  Result Date: 10/08/2022 CLINICAL DATA:  Mental  status change, at risk for PRES. History of multiple myeloma EXAM: MRI HEAD WITHOUT AND WITH CONTRAST TECHNIQUE: Multiplanar, multiecho pulse sequences of the  brain and surrounding structures were obtained without and with intravenous contrast. CONTRAST:  3m GADAVIST GADOBUTROL 1 MMOL/ML IV SOLN COMPARISON:  CT head 09/17/2022, MR head 03/31/2021 FINDINGS: Brain: There is no acute intracranial hemorrhage, extra-axial fluid collection, or acute infarct Parenchymal volume is normal. The ventricles are normal in size. Patchy FLAIR signal abnormality in the supratentorial white matter is nonspecific but likely reflects sequela of mild chronic small-vessel ischemic change. There is no evidence of posterior reversible encephalopathy syndrome (PRES) or other acute parenchymal signal abnormality There is no mass lesion or abnormal enhancement. There is no mass effect or midline shift. Vascular: Normal flow voids. Skull and upper cervical spine: Multiple calvarial lesions are seen consistent with the history of myeloma, the largest in the left frontal bone measuring up to 1.5 cm. Sinuses/Orbits: Secretions in the right sphenoid sinuses are unchanged. The globes and orbits are unremarkable. Other: None. IMPRESSION: 1. No acute intracranial pathology. No evidence of posterior reversible encephalopathy syndrome. 2. Numerous calvarial lesions consistent with the history of multiple myeloma. Electronically Signed   By: PValetta MoleM.D.   On: 10/08/2022 09:03   MR THORACIC SPINE W WO CONTRAST  Result Date: 09/20/2022 CLINICAL DATA:  Multiple myeloma.  Assess for Mets. EXAM: MRI THORACIC AND LUMBAR SPINE WITHOUT AND WITH CONTRAST TECHNIQUE: Multiplanar and multiecho pulse sequences of the thoracic and lumbar spine were obtained without and with intravenous contrast. CONTRAST:  940mGADAVIST GADOBUTROL 1 MMOL/ML IV SOLN COMPARISON:  PET CT10/5/23, CT C SPine 09/17/22 FINDINGS: MRI THORACIC SPINE FINDINGS Alignment:  Mildly exaggerated thoracic kyphosis. Vertebrae: No evidence of an acute compression deformity. Vertebral body heights are preserved. There are mild degenerative endplate  changes. In the visualized cervical spine there is a T2 hyperintense lesion along the superior endplate of C7 (series 21, image 11), which is favored to represent a myelomatous lesion. -there is a contrast-enhancing lesion in the posterior elements of T11 (series 25, image 12), favored to represent a myelomatous lesion. Cord:  Normal signal and morphology. Paraspinal and other soft tissues: Negative Disc levels: There are small disc bulges at the T10-T11 and T12-L1 levels resulting in mild spinal canal stenosis. No evidence of high-grade spinal canal stenosis. No evidence of high-grade neural foraminal stenosis. MRI LUMBAR SPINE FINDINGS Segmentation:  Standard. Alignment:  Physiologic. Vertebrae: Mild mid vertebral body height loss at L4 is favored to be degenerative. No evidence of discitis or osteomyelitis. There is a small punctate focal contrast-enhancing lesion along the inferior endplate of L1 which could be degenerative or represent a small myelomatous lesion. Conus medullaris: Extends to the L1 level and appears normal. Paraspinal and other soft tissues: There is atrophy of the paraspinal musculature bilaterally. The urinary bladder is markedly distended. Disc levels: There is multilevel degenerative disc disease with a right paracentral disc protrusion at T12-L1, a circumferential disc bulge at L1-L2, L2-L3, L3-L4, and L4-L5. The disc bulges results in mild spinal canal narrowing. There is no evidence of high-grade neural foraminal stenosis. IMPRESSION: 1. Small contrast-enhancing lesions in the posterior elements of T11 and possibly along the superior endplate of C7 are favored to represent myelomatous lesions. 2. Punctate contrast-enhancing lesion along the inferior endplate of L1 could be degenerative or represent a small myelomatous lesion. 3. Multilevel degenerative changes in the thoracolumbar spine without evidence of high grade spinal canal or neuroforaminal stenosis. Electronically  Signed   By:  Marin Roberts M.D.   On: 09/20/2022 09:29   MR Lumbar Spine W Wo Contrast  Result Date: 09/20/2022 CLINICAL DATA:  Multiple myeloma.  Assess for Mets. EXAM: MRI THORACIC AND LUMBAR SPINE WITHOUT AND WITH CONTRAST TECHNIQUE: Multiplanar and multiecho pulse sequences of the thoracic and lumbar spine were obtained without and with intravenous contrast. CONTRAST:  2m GADAVIST GADOBUTROL 1 MMOL/ML IV SOLN COMPARISON:  PET CT10/5/23, CT C SPine 09/17/22 FINDINGS: MRI THORACIC SPINE FINDINGS Alignment:  Mildly exaggerated thoracic kyphosis. Vertebrae: No evidence of an acute compression deformity. Vertebral body heights are preserved. There are mild degenerative endplate changes. In the visualized cervical spine there is a T2 hyperintense lesion along the superior endplate of C7 (series 21, image 11), which is favored to represent a myelomatous lesion. -there is a contrast-enhancing lesion in the posterior elements of T11 (series 25, image 12), favored to represent a myelomatous lesion. Cord:  Normal signal and morphology. Paraspinal and other soft tissues: Negative Disc levels: There are small disc bulges at the T10-T11 and T12-L1 levels resulting in mild spinal canal stenosis. No evidence of high-grade spinal canal stenosis. No evidence of high-grade neural foraminal stenosis. MRI LUMBAR SPINE FINDINGS Segmentation:  Standard. Alignment:  Physiologic. Vertebrae: Mild mid vertebral body height loss at L4 is favored to be degenerative. No evidence of discitis or osteomyelitis. There is a small punctate focal contrast-enhancing lesion along the inferior endplate of L1 which could be degenerative or represent a small myelomatous lesion. Conus medullaris: Extends to the L1 level and appears normal. Paraspinal and other soft tissues: There is atrophy of the paraspinal musculature bilaterally. The urinary bladder is markedly distended. Disc levels: There is multilevel degenerative disc disease with a right paracentral disc  protrusion at T12-L1, a circumferential disc bulge at L1-L2, L2-L3, L3-L4, and L4-L5. The disc bulges results in mild spinal canal narrowing. There is no evidence of high-grade neural foraminal stenosis. IMPRESSION: 1. Small contrast-enhancing lesions in the posterior elements of T11 and possibly along the superior endplate of C7 are favored to represent myelomatous lesions. 2. Punctate contrast-enhancing lesion along the inferior endplate of L1 could be degenerative or represent a small myelomatous lesion. 3. Multilevel degenerative changes in the thoracolumbar spine without evidence of high grade spinal canal or neuroforaminal stenosis. Electronically Signed   By: HMarin RobertsM.D.   On: 09/20/2022 09:29   ECHOCARDIOGRAM COMPLETE  Result Date: 09/18/2022    ECHOCARDIOGRAM REPORT   Patient Name:   SIRIA SURPRENANTDate of Exam: 09/18/2022 Medical Rec #:  [id number]      Height:       63.0 in Accession #:    [id number]     Weight:       169.8 lb Date of Birth:  922-Apr-1947      BSA:          1.803 m Patient Age:    89years        BP:           132/80 mmHg Patient Gender: F               HR:           79 bpm. [facility]  [facility] 2D Echo, Cardiac Doppler and Color Doppler Indications:    R55 Syncope  History:        Patient has prior history of Echocardiogram examinations, most  recent 05/28/2018. Risk Factors:Hypertension, Diabetes and                 Dyslipidemia. Multiple myeloma not having achieved remission,                 Acute renal failure superimposed on stage 3b chronic kidney                 disease.  Sonographer:    Alvino Chapel RCS Referring Phys: [id number] [place]  1. Left ventricular ejection fraction, by estimation, is 55 to 60%. The left ventricle has normal function. The left ventricle has no regional wall motion abnormalities. Left ventricular diastolic parameters are consistent with Grade I diastolic dysfunction (impaired relaxation).  2. Right  ventricular systolic function is normal. The right ventricular size is normal. Tricuspid regurgitation signal is inadequate for assessing PA pressure.  3. Left atrial size was mild to moderately dilated.  4. The mitral valve is abnormal. Trivial mitral valve regurgitation. No evidence of mitral stenosis.  5. The aortic valve is tricuspid. There is mild calcification of the aortic valve. Aortic valve regurgitation is not visualized. No aortic stenosis is present.  6. The inferior vena cava is normal in size with greater than 50% respiratory variability, suggesting right atrial pressure of 3 mmHg. Comparison(s): No prior Echocardiogram. FINDINGS  Left Ventricle: Left ventricular ejection fraction, by estimation, is 55 to 60%. The left ventricle has normal function. The left ventricle has no regional wall motion abnormalities. The left ventricular internal cavity size was normal in size. There is  no left ventricular hypertrophy. Left ventricular diastolic parameters are consistent with Grade I diastolic dysfunction (impaired relaxation). Right Ventricle: The right ventricular size is normal. No increase in right ventricular wall thickness. Right ventricular systolic function is normal. Tricuspid regurgitation signal is inadequate for assessing PA pressure. Left Atrium: Left atrial size was mild to moderately dilated. Right Atrium: Right atrial size was normal in size. Pericardium: There is no evidence of pericardial effusion. Mitral Valve: The mitral valve is abnormal. Mild to moderate mitral annular calcification. Trivial mitral valve regurgitation. No evidence of mitral valve stenosis. Tricuspid Valve: The tricuspid valve is not well visualized. Tricuspid valve regurgitation is not demonstrated. No evidence of tricuspid stenosis. Aortic Valve: The aortic valve is tricuspid. There is mild calcification of the aortic valve. Aortic valve regurgitation is not visualized. No aortic stenosis is present. Pulmonic Valve:  The pulmonic valve was not well visualized. Pulmonic valve regurgitation is not visualized. No evidence of pulmonic stenosis. Aorta: The aortic root is normal in size and structure. Venous: The inferior vena cava is normal in size with greater than 50% respiratory variability, suggesting right atrial pressure of 3 mmHg. IAS/Shunts: No atrial level shunt detected by color flow Doppler.  LEFT VENTRICLE PLAX 2D LVIDd:         4.60 cm   Diastology LVIDs:         2.90 cm   LV e' medial:    7.62 cm/s LV PW:         0.90 cm   LV E/e' medial:  12.7 LV IVS:        0.90 cm   LV e' lateral:   8.81 cm/s LVOT diam:     1.90 cm   LV E/e' lateral: 11.0 LV SV:         87 LV SV Index:   48 LVOT Area:     2.84 cm  RIGHT VENTRICLE RV S prime:  13.60 cm/s TAPSE (M-mode): 2.0 cm LEFT ATRIUM             Index        RIGHT ATRIUM           Index LA diam:        3.60 cm 2.00 cm/m   RA Area:     11.90 cm LA Vol (A2C):   75.3 ml 41.75 ml/m  RA Volume:   25.80 ml  14.31 ml/m LA Vol (A4C):   58.1 ml 32.22 ml/m LA Biplane Vol: 69.2 ml 38.37 ml/m  AORTIC VALVE LVOT Vmax:   153.00 cm/s LVOT Vmean:  95.800 cm/s LVOT VTI:    0.306 m  AORTA Ao Root diam: 3.20 cm MITRAL VALVE MV Area (PHT): 3.37 cm     SHUNTS MV Decel Time: 225 msec     Systemic VTI:  0.31 m MV E velocity: 96.80 cm/s   Systemic Diam: 1.90 cm MV A velocity: 115.00 cm/s MV E/A ratio:  0.84 Vishnu Priya Mallipeddi Electronically signed by Lorelee Cover Mallipeddi Signature Date/Time: 09/18/2022/1:47:41 PM    Final    CT Head Wo Contrast  Result Date: 09/17/2022 CLINICAL DATA:  Head trauma, intracranial injury suspected. Patient became dizzy and fell face first on the floor. EXAM: CT HEAD WITHOUT CONTRAST CT MAXILLOFACIAL WITHOUT CONTRAST CT CERVICAL SPINE WITHOUT CONTRAST TECHNIQUE: Multidetector CT imaging of the head, cervical spine, and maxillofacial structures were performed using the standard protocol without intravenous contrast. Multiplanar CT image reconstructions  of the cervical spine and maxillofacial structures were also generated. RADIATION DOSE REDUCTION: This exam was performed according to the departmental dose-optimization program which includes automated exposure control, adjustment of the mA and/or kV according to patient size and/or use of iterative reconstruction technique. COMPARISON:  None Available. FINDINGS: CT HEAD FINDINGS Brain: No evidence of acute infarction, hemorrhage, hydrocephalus, extra-axial collection or mass lesion/mass effect. Patchy areas of low-attenuation suggesting moderate chronic microvascular ischemic changes. Vascular: No hyperdense vessel or unexpected calcification. Skull: Multiple calvarial lucencies consistent with history of myeloma. Negative for fracture or focal lesion. Other: None. CT MAXILLOFACIAL FINDINGS Osseous: Mild irregularity of bilateral nasal bones with surrounding soft tissue swelling suggesting nondisplaced fracture. Orbits: Negative. No traumatic or inflammatory finding. Sinuses: Clear. Soft tissues: Soft tissue swelling about the nose. No drainable fluid collection or hematoma. CT CERVICAL SPINE FINDINGS Alignment: Normal. Skull base and vertebrae: Multiple lucencies of the cervical vertebral bodies without evidence of acute fracture. Soft tissues and spinal canal: No prevertebral fluid or swelling. No visible canal hematoma. Disc levels: Multilevel degenerate disc disease with disc height loss. Multilevel facet joint arthropathy. Upper chest: Biapical pleural/parenchymal scarring. Other: None IMPRESSION: CT HEAD: 1. No acute intracranial abnormality. 2. Multiple calvarial lucencies consistent with history of myeloma. CT MAXILLOFACIAL: Mild irregularity of bilateral nasal bones with surrounding soft tissue swelling suggesting nondisplaced fracture. CT CERVICAL SPINE: 1. No acute fracture or traumatic subluxation. 2. Multilevel degenerate disc disease and facet joint arthropathy. 3. Multiple lucencies of the cervical  vertebral bodies consistent with history of myeloma. Electronically Signed   By: Keane Police D.O.   On: 09/17/2022 13:26   CT Cervical Spine Wo Contrast  Result Date: 09/17/2022 CLINICAL DATA:  Head trauma, intracranial injury suspected. Patient became dizzy and fell face first on the floor. EXAM: CT HEAD WITHOUT CONTRAST CT MAXILLOFACIAL WITHOUT CONTRAST CT CERVICAL SPINE WITHOUT CONTRAST TECHNIQUE: Multidetector CT imaging of the head, cervical spine, and maxillofacial structures were performed using the standard protocol without  intravenous contrast. Multiplanar CT image reconstructions of the cervical spine and maxillofacial structures were also generated. RADIATION DOSE REDUCTION: This exam was performed according to the departmental dose-optimization program which includes automated exposure control, adjustment of the mA and/or kV according to patient size and/or use of iterative reconstruction technique. COMPARISON:  None Available. FINDINGS: CT HEAD FINDINGS Brain: No evidence of acute infarction, hemorrhage, hydrocephalus, extra-axial collection or mass lesion/mass effect. Patchy areas of low-attenuation suggesting moderate chronic microvascular ischemic changes. Vascular: No hyperdense vessel or unexpected calcification. Skull: Multiple calvarial lucencies consistent with history of myeloma. Negative for fracture or focal lesion. Other: None. CT MAXILLOFACIAL FINDINGS Osseous: Mild irregularity of bilateral nasal bones with surrounding soft tissue swelling suggesting nondisplaced fracture. Orbits: Negative. No traumatic or inflammatory finding. Sinuses: Clear. Soft tissues: Soft tissue swelling about the nose. No drainable fluid collection or hematoma. CT CERVICAL SPINE FINDINGS Alignment: Normal. Skull base and vertebrae: Multiple lucencies of the cervical vertebral bodies without evidence of acute fracture. Soft tissues and spinal canal: No prevertebral fluid or swelling. No visible canal hematoma.  Disc levels: Multilevel degenerate disc disease with disc height loss. Multilevel facet joint arthropathy. Upper chest: Biapical pleural/parenchymal scarring. Other: None IMPRESSION: CT HEAD: 1. No acute intracranial abnormality. 2. Multiple calvarial lucencies consistent with history of myeloma. CT MAXILLOFACIAL: Mild irregularity of bilateral nasal bones with surrounding soft tissue swelling suggesting nondisplaced fracture. CT CERVICAL SPINE: 1. No acute fracture or traumatic subluxation. 2. Multilevel degenerate disc disease and facet joint arthropathy. 3. Multiple lucencies of the cervical vertebral bodies consistent with history of myeloma. Electronically Signed   By: Keane Police D.O.   On: 09/17/2022 13:26   CT Maxillofacial Wo Contrast  Result Date: 09/17/2022 CLINICAL DATA:  Head trauma, intracranial injury suspected. Patient became dizzy and fell face first on the floor. EXAM: CT HEAD WITHOUT CONTRAST CT MAXILLOFACIAL WITHOUT CONTRAST CT CERVICAL SPINE WITHOUT CONTRAST TECHNIQUE: Multidetector CT imaging of the head, cervical spine, and maxillofacial structures were performed using the standard protocol without intravenous contrast. Multiplanar CT image reconstructions of the cervical spine and maxillofacial structures were also generated. RADIATION DOSE REDUCTION: This exam was performed according to the departmental dose-optimization program which includes automated exposure control, adjustment of the mA and/or kV according to patient size and/or use of iterative reconstruction technique. COMPARISON:  None Available. FINDINGS: CT HEAD FINDINGS Brain: No evidence of acute infarction, hemorrhage, hydrocephalus, extra-axial collection or mass lesion/mass effect. Patchy areas of low-attenuation suggesting moderate chronic microvascular ischemic changes. Vascular: No hyperdense vessel or unexpected calcification. Skull: Multiple calvarial lucencies consistent with history of myeloma. Negative for  fracture or focal lesion. Other: None. CT MAXILLOFACIAL FINDINGS Osseous: Mild irregularity of bilateral nasal bones with surrounding soft tissue swelling suggesting nondisplaced fracture. Orbits: Negative. No traumatic or inflammatory finding. Sinuses: Clear. Soft tissues: Soft tissue swelling about the nose. No drainable fluid collection or hematoma. CT CERVICAL SPINE FINDINGS Alignment: Normal. Skull base and vertebrae: Multiple lucencies of the cervical vertebral bodies without evidence of acute fracture. Soft tissues and spinal canal: No prevertebral fluid or swelling. No visible canal hematoma. Disc levels: Multilevel degenerate disc disease with disc height loss. Multilevel facet joint arthropathy. Upper chest: Biapical pleural/parenchymal scarring. Other: None IMPRESSION: CT HEAD: 1. No acute intracranial abnormality. 2. Multiple calvarial lucencies consistent with history of myeloma. CT MAXILLOFACIAL: Mild irregularity of bilateral nasal bones with surrounding soft tissue swelling suggesting nondisplaced fracture. CT CERVICAL SPINE: 1. No acute fracture or traumatic subluxation. 2. Multilevel degenerate disc disease and facet  joint arthropathy. 3. Multiple lucencies of the cervical vertebral bodies consistent with history of myeloma. Electronically Signed   By: Keane Police D.O.   On: 09/17/2022 13:26   US Abdomen Complete  Result Date: 09/14/2022 CLINICAL DATA:  Abdominal pain for 3 days with diarrhea. Hysterectomy and appendectomy. EXAM: ABDOMEN ULTRASOUND COMPLETE COMPARISON:  Abdominal ultrasound 05/04/2022. Abdominopelvic CT 09/14/2022. FINDINGS: Gallbladder: No gallstones or wall thickening visualized. No sonographic Murphy sign noted by sonographer. Common bile duct: Diameter: 3 mm Liver: The panic echogenicity is diffusely increased, consistent with steatosis as demonstrated on earlier CT. No focal abnormalities are identified. Portal vein is patent on color Doppler imaging with normal direction  of blood flow towards the liver. IVC: No abnormality visualized. Pancreas: Largely obscured by bowel gas.  Seen on previous CT. Spleen: Size and appearance within normal limits. Right Kidney: Length: 8.3 cm. Mildly increased cortical echogenicity. No mass or hydronephrosis visualized. Left Kidney: Length: 10.3 cm. Mildly increased cortical echogenicity. No mass or hydronephrosis visualized. Abdominal aorta: No aneurysm visualized. Other findings: Study limited by body habitus. No ascites identified. IMPRESSION: 1. No acute abdominal process identified. 2. Hepatic steatosis. 3. Mildly increased renal cortical echogenicity, correlate for medical renal disease. Electronically Signed   By: Richardean Sale M.D.   On: 09/14/2022 14:30   CT ABDOMEN PELVIS WO CONTRAST  Result Date: 09/14/2022 CLINICAL DATA:  Acute right-sided abdominal pain. History of multiple myeloma. EXAM: CT ABDOMEN AND PELVIS WITHOUT CONTRAST TECHNIQUE: Multidetector CT imaging of the abdomen and pelvis was performed following the standard protocol without IV contrast. RADIATION DOSE REDUCTION: This exam was performed according to the departmental dose-optimization program which includes automated exposure control, adjustment of the mA and/or kV according to patient size and/or use of iterative reconstruction technique. COMPARISON:  August 04, 2022. FINDINGS: Lower chest: No acute abnormality. Hepatobiliary: No cholelithiasis or biliary dilatation is noted. Hepatic steatosis. Pancreas: Unremarkable. No pancreatic ductal dilatation or surrounding inflammatory changes. Spleen: Normal in size without focal abnormality. Adrenals/Urinary Tract: Adrenal glands are unremarkable. Kidneys are normal, without renal calculi, focal lesion, or hydronephrosis. Bladder is unremarkable. Stomach/Bowel: Stomach is unremarkable. There is no evidence of bowel obstruction or inflammation. Status post appendectomy. Vascular/Lymphatic: Aortic atherosclerosis. No  enlarged abdominal or pelvic lymph nodes. Reproductive: Status post hysterectomy. No adnexal masses. Other: No abdominal wall hernia or abnormality. No abdominopelvic ascites. Musculoskeletal: Old T11 and L4 fractures are noted. No acute fracture or spondylolisthesis is noted. There is again noted diffuse permeative osteopenia consistent with history of multiple myeloma. IMPRESSION: Hepatic steatosis. No acute abnormality seen in the abdomen or pelvis. Stable diffuse permeative osteopenia throughout the visualized skeleton consistent with history of multiple myeloma. Aortic Atherosclerosis (ICD10-I70.0). Electronically Signed   By: Marijo Conception M.D.   On: 09/14/2022 13:32

## 2022-10-08 NOTE — Patient Instructions (Addendum)
[place] at [facility] Discharge Instructions   You were seen and examined today by Dr. Delton Coombes.  He reviewed the results of your lab work which are normal/stable.   He reviewed the results of your MRI of the brain which is normal.   We will hold your treatment today. We will see you back in 2 weeks after to review the results of your myeloma lab work and discuss treatment at that time.   Return as scheduled.    Thank you for choosing [place] at [facility] to provide your oncology and hematology care.  To afford each patient quality time with our provider, please arrive at least 15 minutes before your scheduled appointment time.   If you have a lab appointment with the [place] [facility] please come in thru the Main Entrance and check in at the main information desk.  You need to re-schedule your appointment should you arrive 10 or more minutes late.  We strive to give you quality time with our providers, and arriving late affects you and other patients whose appointments are after yours.  Also, if you no show three or more times for appointments you may be dismissed from the clinic at the providers discretion.     Again, thank you for choosing [facility].  Our hope is that these requests will decrease the amount of time that you wait before being seen by our physicians.       _____________________________________________________________  Should you have questions after your visit to [facility], please contact our office at [phone] and follow the prompts.  Our office hours are 8:00 a.m. and 4:30 p.m. Monday - Friday.  Please note that voicemails left after 4:00 p.m. may not be returned until the following business day.  We are closed weekends and major holidays.  You do have access to a nurse 24-7, just call the main number to the clinic [phone] and do not press any options, hold on the line and a nurse  will answer the phone.    For prescription refill requests, have your pharmacy contact our office and allow 72 hours.    Due to Covid, you will need to wear a mask upon entering the hospital. If you do not have a mask, a mask will be given to you at the Main Entrance upon arrival. For doctor visits, patients may have 1 support person age 64 or older with them. For treatment visits, patients can not have anyone with them due to social distancing guidelines and our immunocompromised population.

## 2022-10-08 NOTE — Progress Notes (Signed)
Patients port flushed without difficulty.  Good blood return noted with no bruising or swelling noted at site.  Stable during access and blood draw.  Patient to remain accessed for possible fluids. 

## 2022-10-08 NOTE — Progress Notes (Signed)
Nutrition Follow-up:  Patient with multiple myeloma. She is receiving DaraVD q21d + Daratumumab q28d. Patient currently receiving Velcade only (start 1/22).  Recent admissions: 1/29-2/2 - frequent falls secondary to orthostatic hypotension  1/15-1/16 hospital admission - acute metabolic encephalopathy [id number] hospital admission - RSV, pulmonary embolism   Spoke with RN. Patient seen by MD in clinic prior to infusion. Treatment held today. Unable to complete nutrition follow-up as planned.    Medications: reviewed   Labs: glucose 326, BUN 28, Cr 1.75  Anthropometrics: Wt 165 lb 6.4 oz today decreased (recent admissions likely attributing to wt loss)  1/22 - 170 lb 9.6 oz    NUTRITION DIAGNOSIS: Unintended wt loss continues    INTERVENTION:  Ensure Plus/equivalent BID    MONITORING, EVALUATION, GOAL: weight trends, intake    NEXT VISIT: Monday March 11 during infusion

## 2022-10-09 ENCOUNTER — Other Ambulatory Visit: Payer: Self-pay

## 2022-10-09 LAB — KAPPA/LAMBDA LIGHT CHAINS
Kappa free light chain: 5.9 mg/L (ref 3.3–19.4)
Kappa, lambda light chain ratio: 0.41 (ref 0.26–1.65)
Lambda free light chains: 14.5 mg/L (ref 5.7–26.3)

## 2022-10-10 ENCOUNTER — Encounter: Payer: Self-pay | Admitting: *Deleted

## 2022-10-10 LAB — PROTEIN ELECTROPHORESIS, SERUM
A/G Ratio: 1.3 (ref 0.7–1.7)
Albumin ELP: 3.6 g/dL (ref 2.9–4.4)
Alpha-1-Globulin: 0.3 g/dL (ref 0.0–0.4)
Alpha-2-Globulin: 1.1 g/dL — ABNORMAL HIGH (ref 0.4–1.0)
Beta Globulin: 0.8 g/dL (ref 0.7–1.3)
Gamma Globulin: 0.7 g/dL (ref 0.4–1.8)
Globulin, Total: 2.8 g/dL (ref 2.2–3.9)
M-Spike, %: 0.3 g/dL — ABNORMAL HIGH
Total Protein ELP: 6.4 g/dL (ref 6.0–8.5)

## 2022-10-11 ENCOUNTER — Telehealth: Payer: Self-pay | Admitting: *Deleted

## 2022-10-11 ENCOUNTER — Other Ambulatory Visit: Payer: Self-pay

## 2022-10-11 ENCOUNTER — Inpatient Hospital Stay: Payer: 59

## 2022-10-11 NOTE — Telephone Encounter (Signed)
Patient called to advise that she is continuing to feel lightheaded periodically and has to lay down to feel better.  She has been dealing with this for several months now and has been to the ER for falls associated with dizziness.  When asked about her chlorthalidone in reference to when she begins to feel this way, she stated that it seems to be shortly after she takes it that she begins to be symptomatic.  Encouraged her to drink plenty of fluids, get up slowly and use assistive devices for ambulation.  I will follow up with her in the morning, after taking medication and blood pressure and discuss with Dr. Delton Coombes.

## 2022-10-12 ENCOUNTER — Telehealth: Payer: Self-pay | Admitting: *Deleted

## 2022-10-12 NOTE — Telephone Encounter (Signed)
She started back taking the chlorthalidone. About 45 minutes to an hour after taking, she feels woosey and unsteady on her feet. I had her check her blood pressures yesterday, which were normal. She starts feeling better a few hours after taking. Per Dr. Delton Coombes, will hold Chlorthalidone over the weekend and will follow up with her on Monday to see if she is feeling better.  Patient aware and verbalized understanding.

## 2022-10-15 ENCOUNTER — Inpatient Hospital Stay: Payer: 59

## 2022-10-15 ENCOUNTER — Other Ambulatory Visit: Payer: Self-pay | Admitting: *Deleted

## 2022-10-15 ENCOUNTER — Telehealth: Payer: Self-pay | Admitting: *Deleted

## 2022-10-15 DIAGNOSIS — G479 Sleep disorder, unspecified: Secondary | ICD-10-CM

## 2022-10-15 MED ORDER — ZOLPIDEM TARTRATE 10 MG PO TABS: 10.0000 mg | ORAL_TABLET | Freq: Every evening | ORAL | 5 refills | Status: DC | PRN

## 2022-10-15 NOTE — Telephone Encounter (Signed)
Patient states that her symptoms have subsided since stopping chlorthalidone.  Advised to stay off at this point,  Verbalized understanding.

## 2022-10-16 ENCOUNTER — Encounter: Payer: Self-pay | Admitting: Hematology

## 2022-10-18 ENCOUNTER — Inpatient Hospital Stay: Payer: 59

## 2022-10-22 ENCOUNTER — Inpatient Hospital Stay: Payer: 59

## 2022-10-24 ENCOUNTER — Other Ambulatory Visit: Payer: Self-pay | Admitting: *Deleted

## 2022-10-24 MED ORDER — HYDROCODONE-ACETAMINOPHEN 10-325 MG PO TABS: 1.0000 | ORAL_TABLET | Freq: Four times a day (QID) | ORAL | 0 refills | Status: DC | PRN

## 2022-10-28 NOTE — Patient Instructions (Signed)
[facility] at [facility] Discharge Instructions   You were seen and examined today by Dr. Ellin Saba.  He reviewed the results of your lab work which are normal/stable. Your m-spike has gone up slightly. This is likely because your treatment has been on hold.   We will proceed with your treatment today.   Return as scheduled.    Thank you for choosing [facility] at [facility] to provide your oncology and hematology care.  To afford each patient quality time with our provider, please arrive at least 15 minutes before your scheduled appointment time.   If you have a lab appointment with the Cancer Center please come in thru the Main Entrance and check in at the main information desk.  You need to re-schedule your appointment should you arrive 10 or more minutes late.  We strive to give you quality time with our providers, and arriving late affects you and other patients whose appointments are after yours.  Also, if you no show three or more times for appointments you may be dismissed from the clinic at the providers discretion.     Again, thank you for choosing [facility].  Our hope is that these requests will decrease the amount of time that you wait before being seen by our physicians.       _____________________________________________________________  Should you have questions after your visit to [facility], please contact our office at [phone] and follow the prompts.  Our office hours are 8:00 a.m. and 4:30 p.m. Monday - Friday.  Please note that voicemails left after 4:00 p.m. may not be returned until the following business day.  We are closed weekends and major holidays.  You do have access to a nurse 24-7, just call the main number to the clinic [phone] and do not press any options, hold on the line and a nurse will answer the phone.    For prescription refill requests, have your pharmacy contact our  office and allow 72 hours.    Due to Covid, you will need to wear a mask upon entering the hospital. If you do not have a mask, a mask will be given to you at the Main Entrance upon arrival. For doctor visits, patients may have 1 support person age 17 or older with them. For treatment visits, patients can not have anyone with them due to social distancing guidelines and our immunocompromised population.

## 2022-10-29 ENCOUNTER — Inpatient Hospital Stay: Payer: 59

## 2022-10-29 ENCOUNTER — Other Ambulatory Visit: Payer: Self-pay | Admitting: *Deleted

## 2022-10-29 ENCOUNTER — Encounter: Payer: Self-pay | Admitting: Hematology

## 2022-10-29 ENCOUNTER — Inpatient Hospital Stay (HOSPITAL_BASED_OUTPATIENT_CLINIC_OR_DEPARTMENT_OTHER): Payer: 59 | Admitting: Hematology

## 2022-10-29 ENCOUNTER — Inpatient Hospital Stay: Payer: 59 | Admitting: Dietician

## 2022-10-29 ENCOUNTER — Inpatient Hospital Stay: Payer: 59 | Attending: Hematology

## 2022-10-29 VITALS — BP 163/92 | HR 97 | Temp 96.8°F | Resp 18 | Wt 166.9 lb

## 2022-10-29 DIAGNOSIS — I129 Hypertensive chronic kidney disease with stage 1 through stage 4 chronic kidney disease, or unspecified chronic kidney disease: Secondary | ICD-10-CM | POA: Insufficient documentation

## 2022-10-29 DIAGNOSIS — D631 Anemia in chronic kidney disease: Secondary | ICD-10-CM | POA: Insufficient documentation

## 2022-10-29 DIAGNOSIS — C9000 Multiple myeloma not having achieved remission: Secondary | ICD-10-CM

## 2022-10-29 DIAGNOSIS — Z5112 Encounter for antineoplastic immunotherapy: Secondary | ICD-10-CM | POA: Insufficient documentation

## 2022-10-29 DIAGNOSIS — Z86711 Personal history of pulmonary embolism: Secondary | ICD-10-CM | POA: Diagnosis not present

## 2022-10-29 DIAGNOSIS — Z7901 Long term (current) use of anticoagulants: Secondary | ICD-10-CM | POA: Diagnosis not present

## 2022-10-29 DIAGNOSIS — Z95828 Presence of other vascular implants and grafts: Secondary | ICD-10-CM

## 2022-10-29 DIAGNOSIS — N189 Chronic kidney disease, unspecified: Secondary | ICD-10-CM | POA: Insufficient documentation

## 2022-10-29 LAB — CBC WITH DIFFERENTIAL/PLATELET
Abs Immature Granulocytes: 0 10*3/uL (ref 0.00–0.07)
Basophils Absolute: 0 10*3/uL (ref 0.0–0.1)
Basophils Relative: 0 %
Eosinophils Absolute: 0.1 10*3/uL (ref 0.0–0.5)
Eosinophils Relative: 4 %
HCT: 30.7 % — ABNORMAL LOW (ref 36.0–46.0)
Hemoglobin: 9.8 g/dL — ABNORMAL LOW (ref 12.0–15.0)
Immature Granulocytes: 0 %
Lymphocytes Relative: 14 %
Lymphs Abs: 0.4 10*3/uL — ABNORMAL LOW (ref 0.7–4.0)
MCH: 33.9 pg (ref 26.0–34.0)
MCHC: 31.9 g/dL (ref 30.0–36.0)
MCV: 106.2 fL — ABNORMAL HIGH (ref 80.0–100.0)
Monocytes Absolute: 0.3 10*3/uL (ref 0.1–1.0)
Monocytes Relative: 10 %
Neutro Abs: 2.2 10*3/uL (ref 1.7–7.7)
Neutrophils Relative %: 72 %
Platelets: 138 10*3/uL — ABNORMAL LOW (ref 150–400)
RBC: 2.89 MIL/uL — ABNORMAL LOW (ref 3.87–5.11)
RDW: 12.7 % (ref 11.5–15.5)
WBC: 3 10*3/uL — ABNORMAL LOW (ref 4.0–10.5)
nRBC: 0 % (ref 0.0–0.2)

## 2022-10-29 LAB — COMPREHENSIVE METABOLIC PANEL
ALT: 17 U/L (ref 0–44)
AST: 19 U/L (ref 15–41)
Albumin: 4 g/dL (ref 3.5–5.0)
Alkaline Phosphatase: 56 U/L (ref 38–126)
Anion gap: 9 (ref 5–15)
BUN: 23 mg/dL (ref 8–23)
CO2: 26 mmol/L (ref 22–32)
Calcium: 8.7 mg/dL — ABNORMAL LOW (ref 8.9–10.3)
Chloride: 103 mmol/L (ref 98–111)
Creatinine, Ser: 1.36 mg/dL — ABNORMAL HIGH (ref 0.44–1.00)
GFR, Estimated: 41 mL/min — ABNORMAL LOW (ref >60)
Glucose, Bld: 144 mg/dL — ABNORMAL HIGH (ref 70–99)
Potassium: 3.9 mmol/L (ref 3.5–5.1)
Sodium: 138 mmol/L (ref 135–145)
Total Bilirubin: 0.8 mg/dL (ref 0.3–1.2)
Total Protein: 6.3 g/dL — ABNORMAL LOW (ref 6.5–8.1)

## 2022-10-29 LAB — MAGNESIUM: Magnesium: 1.8 mg/dL (ref 1.7–2.4)

## 2022-10-29 MED ORDER — SODIUM CHLORIDE 0.9% FLUSH
10.0000 mL | INTRAVENOUS | Status: DC | PRN
  Administered 2022-10-29: 10 mL via INTRAVENOUS

## 2022-10-29 MED ORDER — DIPHENHYDRAMINE HCL 25 MG PO CAPS: 50.0000 mg | ORAL_CAPSULE | Freq: Once | ORAL | Status: DC

## 2022-10-29 MED ORDER — ACETAMINOPHEN 325 MG PO TABS: 650.0000 mg | ORAL_TABLET | Freq: Once | ORAL | Status: DC

## 2022-10-29 MED ORDER — DEXAMETHASONE 4 MG PO TABS: 20.0000 mg | ORAL_TABLET | Freq: Once | ORAL | Status: DC

## 2022-10-29 MED ORDER — ALPRAZOLAM 0.25 MG PO TABS: ORAL_TABLET | ORAL | 0 refills | Status: DC

## 2022-10-29 MED ORDER — HEPARIN SOD (PORK) LOCK FLUSH 100 UNIT/ML IV SOLN
500.0000 [IU] | Freq: Once | INTRAVENOUS | Status: AC
  Administered 2022-10-29: 500 [IU] via INTRAVENOUS

## 2022-10-29 MED ORDER — DARATUMUMAB-HYALURONIDASE-FIHJ 1800-30000 MG-UT/15ML ~~LOC~~ SOLN
1800.0000 mg | Freq: Once | SUBCUTANEOUS | Status: AC
  Administered 2022-10-29: 1800 mg via SUBCUTANEOUS
  Filled 2022-10-29: qty 15

## 2022-10-29 MED ORDER — BORTEZOMIB CHEMO SQ INJECTION 3.5 MG (2.5MG/ML)
1.0000 mg/m2 | Freq: Once | INTRAMUSCULAR | Status: AC
  Administered 2022-10-29: 2 mg via SUBCUTANEOUS
  Filled 2022-10-29: qty 0.8

## 2022-10-29 NOTE — Progress Notes (Signed)
Patient has been examined by Dr. Delton Coombes. Vital signs and labs have been reviewed by MD - ANC, Creatinine, LFTs, hemoglobin, and platelets are within treatment parameters per M.D. - pt may proceed with treatment.  Velcade dose-reduced '1mg'$ /m2 per MD. Primary RN and pharmacy notified.

## 2022-10-29 NOTE — Progress Notes (Signed)
[name] [address] [place] [address]    Clinic Day:  10/29/2022  Referring physician: [name], MD  Patient Care Team: [name], MD as PCP - General (Internal Medicine) Alexandra Jack, MD as Medical Oncologist (Medical Oncology) [name], DO as Consulting Physician (Gastroenterology)   ASSESSMENT & PLAN:   Assessment: 1.  IgG lambda plasma cell myeloma, stage II, standard risk: -4 cycles of KPD from 04/17/2018 through 08/21/2018, stem cell transplant on 10/23/2018. -PET scan on 01/21/2019 showed multiple bone lesions but without any hypermetabolic activity. -BMBX on 01/21/2019 with normocellular marrow with no increase in plasma cells.  Normal FISH.  MRD results negative. -Maintenance pomalidomide 2 mg 3 weeks on/1 week off started on 03/10/2019. -Bone marrow biopsy on 10/29/2019 shows trilineage hematopoiesis with no evidence of plasma cells.  Chromosome analysis and FISH are normal. -BM BX on 10/27/2020 at [facility] with 30 to 40%, TLH.  2% of the total cells are CD 138+ plasma cells. - Pomalyst was held due to dizziness from 03/29/2021 through 04/26/2021. - MRI of the brain on 03/31/2021 did not show any evidence of intracranial lesions.  Multiple bone lesions in the skull compatible with sequela of myeloma.  - Labs at [facility] on 11/16/2021: M spike 0.24 g.  Immunofixation positive for IgG lambda. - PET scan on 12/14/2021: No evidence of FDG avid osseous or soft tissue myeloma. - She has developed left shoulder blade and right posterior rib pain for the last 3 to 4 months. - 24-hour urine on 12/14/2021: Total protein 136 mg.  Immune immunofixation positive for lambda type Bence-Jones protein. -- PET scan (05/24/2022): Single new hypermetabolic lytic lesion involving left lamina of T11.  No canal encroachment.  No other hypermetabolic bone lesions.  Stable diffuse lytic lesions throughout the axial and appendicular skeleton.  No  soft tissue lesions. - Maintenance Pomalyst discontinued on 05/29/2022 - XRT to the T11 completed on 06/20/2022 - Daratumumab, Velcade and dexamethasone started on 06/11/2022   2.  Pulmonary embolism: -CT angiogram on 04/21/2020 showed filling defect at [facility]. -VQ scan confirmed pulmonary embolism. -She is on Eliquis.   3.  Right shoulder and upper arm pain: - She received XRT to the chest from 12/26/2020 through 01/06/2021.   4.  Myeloma bone disease: - Denosumab held due to exposure of bone in the right lower jaw.  Last dose in 2020.  Plan: 1.  IgG lambda plasma cell myeloma: - We reviewed myeloma labs from 10/08/2022.  M spike has increased to 0.3 g.  Lambda light chains are 14.5 and ratio is normal at 0.41. - Labs today shows creatinine 1.36 and normal LFTs.  CBC shows mild leukopenia and thrombocytopenia. - I have recommended starting back on treatment with cycle 5-day 1 today. - Tingling in the feet for the last 1 month slightly worse.  We will decrease Velcade dose to 1 mg/m. - RTC 3 weeks for follow-up with repeat multiple myeloma labs on same day.   2.  Back pain/left shoulder blade pain/right lateral and posterior rib pains: - Continue hydrocodone 10/325 every 6 hours as needed.   3.  Hypomagnesemia: - Continue magnesium twice daily.  Magnesium is 1.8.   4.  Anxiety: - She is taking Xanax 0.25 mg twice daily.  She reports that she cannot sleep well.  We will increase evening dose to 0.5 mg.   5.  Sleeping difficulty: - Continue Ambien 10 mg at bedtime as  needed.   6.  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  Hemoglobin today is 9.8.   7.  Pulmonary embolism: - Continue Eliquis daily.  No bleeding issues.    Orders Placed This Encounter  Procedures   CBC with Differential    Standing Status:   Future    Standing Expiration Date:   12/08/2023   Comprehensive metabolic panel    Standing Status:   Future    Standing  Expiration Date:   12/08/2023   CBC with Differential    Standing Status:   Future    Standing Expiration Date:   12/15/2023   Comprehensive metabolic panel    Standing Status:   Future    Standing Expiration Date:   12/15/2023   CBC with Differential    Standing Status:   Future    Standing Expiration Date:   12/29/2023   Comprehensive metabolic panel    Standing Status:   Future    Standing Expiration Date:   12/29/2023   CBC with Differential    Standing Status:   Future    Standing Expiration Date:   01/05/2024   Comprehensive metabolic panel    Standing Status:   Future    Standing Expiration Date:   01/05/2024   CBC with Differential    Standing Status:   Future    Standing Expiration Date:   01/19/2024     I,Alexandra Price,acting as a scribe for [employer], MD.,have documented all relevant documentation on the behalf of Alexandra Jack, MD,as directed by  Alexandra Jack, MD while in the presence of Alexandra Jack, MD.  I, Alexandra Jack MD, have reviewed the above documentation for accuracy and completeness, and I agree with the above.    Alexandra Jack, MD   3/11/20245:19 PM  CHIEF COMPLAINT:   Diagnosis: multiple myeloma    Cancer Staging  No matching staging information was found for the patient.   Prior Therapy: 1. KPD x 4 cycles from 04/17/2018 to 08/21/2018. 2. Stem cell transplant on 10/23/2018. 3. Radiation to left and right humerus 25 Gy in 10 fractions from 08/23/2020 to 08/25/2020. 4.  Maintenance Pomalyst until 06/08/2022  Current Therapy:  Darzalex, Velcade and dexamethasone started on 06/11/2022    HISTORY OF PRESENT ILLNESS:   Oncology History  Multiple myeloma not having achieved remission ([place])  04/07/2018 Initial Diagnosis   Multiple myeloma not having achieved remission ([place])   06/11/2022 -  Chemotherapy   Patient is on Treatment Plan : MYELOMA RELAPSED / REFRACTORY Daratumumab SQ + Bortezomib + Dexamethasone  (DaraVd) q21d / Daratumumab SQ q28d      Multiple myeloma without remission ([place])  04/15/2018 Initial Diagnosis   Multiple myeloma without remission ([place])   04/17/2018 - 09/05/2018 Chemotherapy   The patient had dexamethasone (DECADRON) 4 MG tablet, 1 of 1 cycle, Start date: 04/15/2018, End date: 05/26/2018 palonosetron (ALOXI) injection 0.25 mg, 0.25 mg, Intravenous,  Once, 1 of 1 cycle Administration: 0.25 mg (04/17/2018), 0.25 mg (04/24/2018), 0.25 mg (05/01/2018) cyclophosphamide (CYTOXAN) 540 mg in sodium chloride 0.9 % 250 mL chemo infusion, 300 mg/m2 = 540 mg, Intravenous,  Once, 1 of 1 cycle Administration: 540 mg (04/17/2018), 540 mg (04/24/2018), 540 mg (05/01/2018) carfilzomib (KYPROLIS) 36 mg in dextrose 5 % 50 mL chemo infusion, 20 mg/m2 = 36 mg, Intravenous, Once, 5 of 5 cycles Administration: 36 mg (04/17/2018), 36 mg (04/18/2018), 60 mg (04/24/2018), 60 mg (04/25/2018), 60 mg (05/01/2018), 60 mg (05/02/2018), 60 mg (05/15/2018), 60 mg (  05/16/2018), 60 mg (06/12/2018), 60 mg (06/13/2018), 60 mg (06/26/2018), 60 mg (06/27/2018), 60 mg (07/03/2018), 60 mg (07/04/2018), 60 mg (07/24/2018), 60 mg (07/25/2018), 60 mg (07/31/2018), 60 mg (08/01/2018), 60 mg (08/08/2018), 60 mg (08/07/2018), 60 mg (08/21/2018), 60 mg (08/22/2018), 60 mg (08/28/2018), 60 mg (08/29/2018), 60 mg (09/04/2018), 60 mg (09/05/2018)  for chemotherapy treatment.    06/11/2022 -  Chemotherapy   Patient is on Treatment Plan : MYELOMA RELAPSED / REFRACTORY Daratumumab SQ + Bortezomib + Dexamethasone (DaraVd) q21d / Daratumumab SQ q28d         INTERVAL HISTORY:   Alexandra Price is a 77 y.o. female presenting to clinic today for follow up of multiple myeloma. She was last seen by me on 10/08/22.  Today, she states that she is doing well overall. Her appetite level is at 60%. Her energy level is at 25%. She denies any falls at home.  She reports worsening neuropathy in the feet for the last 1 month.  PAST MEDICAL HISTORY:   Past Medical History: Past  Medical History:  Diagnosis Date   Anxiety    Chronic kidney disease    Depression    Hypertension    Multiple myeloma ([place])    multiple myeloma   Pre-diabetes     Surgical History: Past Surgical History:  Procedure Laterality Date   ABDOMINAL HYSTERECTOMY     total   APPENDECTOMY     BALLOON DILATION N/A 05/28/2022   Procedure: BALLOON DILATION;  Surgeon: [name], DO;  [facility];  Service: Endoscopy;  Laterality: N/A;   BIOPSY  05/28/2022   Procedure: BIOPSY;  Surgeon: [name], DO;  [facility];  Service: Endoscopy;;   COLONOSCOPY WITH PROPOFOL N/A 12/25/2021   Procedure: COLONOSCOPY WITH PROPOFOL;  Surgeon: [name], DO;  [facility];  Service: Endoscopy;  Laterality: N/A;  2:30pm   ESOPHAGOGASTRODUODENOSCOPY (EGD) WITH PROPOFOL N/A 05/28/2022   Procedure: ESOPHAGOGASTRODUODENOSCOPY (EGD) WITH PROPOFOL;  Surgeon: [name], DO;  [facility];  Service: Endoscopy;  Laterality: N/A;  10:00am, asa 3   LAPAROSCOPIC APPENDECTOMY N/A 05/20/2018   Procedure: APPENDECTOMY LAPAROSCOPIC;  Surgeon: [name], MD;  Location: [facility];  Service: General;  Laterality: N/A;   POLYPECTOMY  12/25/2021   Procedure: POLYPECTOMY;  Surgeon: [name], DO;  [facility];  Service: Endoscopy;;   PORTACATH PLACEMENT Right 04/14/2018   Procedure: INSERTION PORT-A-CATH;  Surgeon: [name], MD;  Location: [facility];  Service: General;  Laterality: Right;    Social History: Social History   Socioeconomic History   Marital status: Divorced    Spouse name: Not on file   Number of children: 6   Years of education: Not on file   Highest education level: Not on file  Occupational History    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  Tobacco Use   Smoking status: Never   Smokeless tobacco: Never  Vaping Use   Vaping Use: Never used  Substance and Sexual Activity   Alcohol use: Never   Drug use: Not Currently    Sexual activity: Not Currently  Other Topics Concern   Not on file  Social History Narrative   Not on file   Social Determinants of Health   Financial Resource Strain: Low Risk  (07/06/2020)   Overall Financial Resource Strain (CARDIA)    Difficulty of Paying Living Expenses: Not hard at all  Food Insecurity: No Food Insecurity (09/17/2022)   Hunger Vital Sign  Worried About Charity fundraiser in the Last Year: Never true    [place] in the Last Year: Never true  Transportation Needs: No Transportation Needs (09/17/2022)   PRAPARE - Hydrologist (Medical): No    Lack of Transportation (Non-Medical): No  Physical Activity: Inactive (07/06/2020)   Exercise Vital Sign    Days of Exercise per Week: 0 days    Minutes of Exercise per Session: 0 min  Stress: No Stress Concern Present (07/06/2020)   [place]    Feeling of Stress : Not at all  Social Connections: Moderately Isolated (07/06/2020)   Social Connection and Isolation Panel [NHANES]    Frequency of Communication with Friends and Family: More than three times a week    Frequency of Social Gatherings with Friends and Family: Twice a week    Attends Religious Services: 1 to 4 times per year    Active Member of [employer] or Organizations: No    Attends Archivist Meetings: Never    Marital Status: Separated  Intimate Partner Violence: Not At Risk (09/17/2022)   Humiliation, Afraid, Rape, and Kick questionnaire    Fear of Current or Ex-Partner: No    Emotionally Abused: No    Physically Abused: No    Sexually Abused: No    Family History: Family History  Problem Relation Age of Onset   Heart disease Mother    Emphysema Father    Diabetes Sister    Depression Sister    Cancer Brother        liver, lung, and colon. colon cancer at age 15.   Diabetes Brother     Current Medications:  Current Outpatient  Medications:    acetaminophen (TYLENOL) 325 MG tablet, Take 650 mg by mouth as needed for moderate pain (prior to infusion)., Disp: , Rfl:    acyclovir (ZOVIRAX) 400 MG tablet, Take 1 tablet (400 mg total) by mouth 2 (two) times daily., Disp: 60 tablet, Rfl: 6   albuterol (PROVENTIL) (2.5 MG/3ML) 0.083% nebulizer solution, Take 3 mLs (2.5 mg total) by nebulization every 4 (four) hours as needed for wheezing or shortness of breath., Disp: 75 mL, Rfl: 2   albuterol (VENTOLIN HFA) 108 (90 Base) MCG/ACT inhaler, Inhale 2 puffs into the lungs every 4 (four) hours as needed for wheezing or shortness of breath., Disp: 18 g, Rfl: 2   ALPRAZolam (XANAX) 0.25 MG tablet, Take 1 tablet (0.25 mg total) by mouth 2 (two) times daily as needed for anxiety., Disp: 30 tablet, Rfl: 0   Calcium Carb-Cholecalciferol (CALCIUM 1000 + D PO), Take 1,000 mg by mouth daily., Disp: , Rfl:    chlorthalidone (HYGROTON) 25 MG tablet, Take 25 mg by mouth every morning., Disp: , Rfl:    dexAMETHasone 20 MG TABS, Take 1 tablet by mouth as directed. Take 1 tablet 30 minutes prior to infusions, Disp: 30 tablet, Rfl: 2   dextromethorphan-guaiFENesin (MUCINEX DM) 30-600 MG 12hr tablet, Take 1 tablet by mouth 2 (two) times daily., Disp: 20 tablet, Rfl: 0   diphenhydrAMINE (BENADRYL) 25 MG tablet, Take 50 mg by mouth as needed (prior to infusion)., Disp: , Rfl:    ELIQUIS 2.5 MG TABS tablet, Take 2.5 mg by mouth 2 (two) times daily., Disp: , Rfl:    escitalopram (LEXAPRO) 20 MG tablet, Take 20 mg by mouth daily., Disp: , Rfl:    glipiZIDE (GLUCOTROL) 5 MG tablet, Take  1 tablet (5 mg total) by mouth daily before breakfast., Disp: 30 tablet, Rfl: 5   HYDROcodone-acetaminophen (NORCO) 10-325 MG tablet, Take 1 tablet by mouth every 6 (six) hours as needed., Disp: 120 tablet, Rfl: 0   magnesium oxide (MAG-OX) 400 (240 Mg) MG tablet, Take 1 tablet (400 mg total) by mouth 2 (two) times daily., Disp: 60 tablet, Rfl: 4   methocarbamol (ROBAXIN) 500  MG tablet, Take 1 tablet (500 mg total) by mouth every 8 (eight) hours as needed for muscle spasms (back pain)., Disp: 20 tablet, Rfl: 0   midodrine (PROAMATINE) 2.5 MG tablet, Take 1 tablet (2.5 mg total) by mouth 3 (three) times daily with meals., Disp: 90 tablet, Rfl: 1   montelukast (SINGULAIR) 10 MG tablet, TAKE 1 TABLET BY MOUTH AS DIRECTED 30 MINUTES PRIOR TO TREATMENT (Patient taking differently: Take 10 mg by mouth See admin instructions. Take 1 tablet by mouth as directed 30 minutes prior to treatment), Disp: 90 tablet, Rfl: 0   Multiple Vitamin (MULTI VITAMIN) TABS, Take 1 tablet by mouth daily., Disp: , Rfl:    Nebulizers (COMPRESSOR/NEBULIZER) MISC, 1 Units by Does not apply route 3 (three) times daily as needed., Disp: 1 each, Rfl: 0   pantoprazole (PROTONIX) 40 MG tablet, Take 1 tablet (40 mg total) by mouth daily., Disp: 90 tablet, Rfl: 6   polyethylene glycol (MIRALAX / GLYCOLAX) 17 g packet, Take 17 g by mouth daily as needed for moderate constipation., Disp: 14 each, Rfl: 0   prochlorperazine (COMPAZINE) 10 MG tablet, Take 1 tablet (10 mg total) by mouth every 6 (six) hours as needed for nausea or vomiting., Disp: 30 tablet, Rfl: 0   rosuvastatin (CRESTOR) 10 MG tablet, Take 10 mg by mouth daily., Disp: , Rfl:    zolpidem (AMBIEN) 10 MG tablet, Take 1 tablet (10 mg total) by mouth at bedtime as needed for sleep., Disp: 30 tablet, Rfl: 5   Allergies: Allergies  Allergen Reactions   Ciprofloxacin Anaphylaxis   Amoxicillin Other (See Comments)    unknown   Morphine And Related Nausea And Vomiting   Augmentin [Amoxicillin-Pot Clavulanate] Other (See Comments)    Headache, insomnia    REVIEW OF SYSTEMS:   Review of Systems  Constitutional:  Negative for chills, fatigue and fever.  HENT:   Positive for trouble swallowing. Negative for lump/mass, mouth sores, nosebleeds and sore throat.   Eyes:  Negative for eye problems.  Respiratory:  Negative for cough and shortness of  breath.   Cardiovascular:  Negative for chest pain, leg swelling and palpitations.  Gastrointestinal:  Negative for abdominal pain, constipation, diarrhea, nausea and vomiting.  Genitourinary:  Negative for bladder incontinence, difficulty urinating, dysuria, frequency, hematuria and nocturia.   Musculoskeletal:  Positive for back pain. Negative for arthralgias, flank pain, myalgias and neck pain.  Skin:  Negative for itching and rash.  Neurological:  Positive for headaches and numbness. Negative for dizziness.  Hematological:  Does not bruise/bleed easily.  Psychiatric/Behavioral:  Positive for sleep disturbance. Negative for depression and suicidal ideas. The patient is not nervous/anxious.   All other systems reviewed and are negative.    VITALS:   There were no vitals taken for this visit.  Wt Readings from Last 3 Encounters:  10/29/22 166 lb 14.4 oz (75.7 kg)  10/08/22 165 lb 6.4 oz (75 kg)  09/24/22 170 lb 4.8 oz (77.2 kg)    There is no height or weight on file to calculate BMI.  Performance status (ECOG): 1 -  Symptomatic but completely ambulatory  PHYSICAL EXAM:   Physical Exam Vitals and nursing note reviewed. Exam conducted with a chaperone present.  Constitutional:      Appearance: Normal appearance.  Cardiovascular:     Rate and Rhythm: Normal rate and regular rhythm.     Pulses: Normal pulses.     Heart sounds: Normal heart sounds.  Pulmonary:     Effort: Pulmonary effort is normal.     Breath sounds: Normal breath sounds.  Abdominal:     Palpations: Abdomen is soft. There is no hepatomegaly, splenomegaly or mass.     Tenderness: There is no abdominal tenderness.  Musculoskeletal:     Right lower leg: No edema.     Left lower leg: No edema.  Lymphadenopathy:     Cervical: No cervical adenopathy.     Right cervical: No superficial, deep or posterior cervical adenopathy.    Left cervical: No superficial, deep or posterior cervical adenopathy.     Upper Body:      Right upper body: No supraclavicular or axillary adenopathy.     Left upper body: No supraclavicular or axillary adenopathy.  Neurological:     General: No focal deficit present.     Mental Status: She is alert and oriented to person, place, and time.  Psychiatric:        Mood and Affect: Mood normal.        Behavior: Behavior normal.     LABS:      Latest Ref Rng & Units 10/29/2022    8:47 AM 10/08/2022    9:09 AM 10/01/2022    2:57 PM  CBC  WBC 4.0 - 10.5 K/uL 3.0  5.1  3.3   Hemoglobin 12.0 - 15.0 g/dL 9.8  10.0  9.4   Hematocrit 36.0 - 46.0 % 30.7  31.1  29.3   Platelets 150 - 400 K/uL 138  151  129       Latest Ref Rng & Units 10/29/2022    8:47 AM 10/08/2022    9:09 AM 10/01/2022    2:57 PM  CMP  Glucose 70 - 99 mg/dL 144  326  182   BUN 8 - 23 mg/dL '23  28  28   '$ Creatinine 0.44 - 1.00 mg/dL 1.36  1.75  1.85   Sodium 135 - 145 mmol/L 138  137  138   Potassium 3.5 - 5.1 mmol/L 3.9  3.7  3.7   Chloride 98 - 111 mmol/L 103  99  100   CO2 22 - 32 mmol/L '26  26  27   '$ Calcium 8.9 - 10.3 mg/dL 8.7  9.1  9.2   Total Protein 6.5 - 8.1 g/dL 6.3  6.7  6.6   Total Bilirubin 0.3 - 1.2 mg/dL 0.8  0.8  0.5   Alkaline Phos 38 - 126 U/L 56  69  75   AST 15 - 41 U/L '19  25  21   '$ ALT 0 - 44 U/L '17  19  17      '$ No results found for: "CEA1", "CEA" / No results found for: "CEA1", "CEA" No results found for: "PSA1" No results found for: "[id number]" No results found for: "CAN125"  Lab Results  Component Value Date   TOTALPROTELP 6.4 10/08/2022   ALBUMINELP 3.6 10/08/2022   A1GS 0.3 10/08/2022   A2GS 1.1 (H) 10/08/2022   BETS 0.8 10/08/2022   GAMS 0.7 10/08/2022   MSPIKE 0.3 (H) 10/08/2022   SPEI Comment 10/08/2022  Lab Results  Component Value Date   TIBC 290 09/17/2022   TIBC 278 07/30/2022   TIBC 272 05/24/2022   FERRITIN 1,059 (H) 09/17/2022   FERRITIN 313 (H) 07/30/2022   FERRITIN 435 (H) 05/24/2022   IRONPCTSAT 10 (L) 09/17/2022   IRONPCTSAT 21 07/30/2022    IRONPCTSAT 27 05/24/2022   Lab Results  Component Value Date   LDH 129 05/24/2022   LDH 135 03/20/2022   LDH 129 12/14/2021     STUDIES:   MR Brain W Wo Contrast  Result Date: 10/08/2022 CLINICAL DATA:  Mental status change, at risk for PRES. History of multiple myeloma EXAM: MRI HEAD WITHOUT AND WITH CONTRAST TECHNIQUE: Multiplanar, multiecho pulse sequences of the brain and surrounding structures were obtained without and with intravenous contrast. CONTRAST:  60m GADAVIST GADOBUTROL 1 MMOL/ML IV SOLN COMPARISON:  CT head 09/17/2022, MR head 03/31/2021 FINDINGS: Brain: There is no acute intracranial hemorrhage, extra-axial fluid collection, or acute infarct Parenchymal volume is normal. The ventricles are normal in size. Patchy FLAIR signal abnormality in the supratentorial white matter is nonspecific but likely reflects sequela of mild chronic small-vessel ischemic change. There is no evidence of posterior reversible encephalopathy syndrome (PRES) or other acute parenchymal signal abnormality There is no mass lesion or abnormal enhancement. There is no mass effect or midline shift. Vascular: Normal flow voids. Skull and upper cervical spine: Multiple calvarial lesions are seen consistent with the history of myeloma, the largest in the left frontal bone measuring up to 1.5 cm. Sinuses/Orbits: Secretions in the right sphenoid sinuses are unchanged. The globes and orbits are unremarkable. Other: None. IMPRESSION: 1. No acute intracranial pathology. No evidence of posterior reversible encephalopathy syndrome. 2. Numerous calvarial lesions consistent with the history of multiple myeloma. Electronically Signed   By: PValetta MoleM.D.   On: 10/08/2022 09:03

## 2022-10-29 NOTE — Progress Notes (Signed)
Patient presents today for Daratumumab/Velcade injections per providers order.  Vital signs and labs reviewed by MD.  Message received from Anastasio Champion RN/Dr. Delton Coombes patient okay for treatment.    Patient states that she took her premedications at home prior to visit.  Stable during administration without incident; injection site WNL; see MAR for injection details.  Patient tolerated procedure well and without incident.  No questions or complaints noted at this time.

## 2022-10-29 NOTE — Progress Notes (Signed)
Patients port flushed without difficulty.  Good blood return noted with no bruising or swelling noted at site.  Band aid applied.  VSS with discharge and left in satisfactory condition with no s/s of distress noted.   

## 2022-10-31 ENCOUNTER — Other Ambulatory Visit: Payer: Self-pay

## 2022-10-31 DIAGNOSIS — C9000 Multiple myeloma not having achieved remission: Secondary | ICD-10-CM

## 2022-11-01 ENCOUNTER — Inpatient Hospital Stay: Payer: 59

## 2022-11-01 VITALS — BP 150/72 | HR 79 | Temp 96.9°F | Resp 16 | Wt 172.0 lb

## 2022-11-01 DIAGNOSIS — Z5112 Encounter for antineoplastic immunotherapy: Secondary | ICD-10-CM | POA: Diagnosis not present

## 2022-11-01 DIAGNOSIS — C9000 Multiple myeloma not having achieved remission: Secondary | ICD-10-CM

## 2022-11-01 MED ORDER — BORTEZOMIB CHEMO SQ INJECTION 3.5 MG (2.5MG/ML)
1.0000 mg/m2 | Freq: Once | INTRAMUSCULAR | Status: AC
  Administered 2022-11-01: 2 mg via SUBCUTANEOUS
  Filled 2022-11-01: qty 0.8

## 2022-11-01 NOTE — Progress Notes (Signed)
Pt presents today for Velcade per provider's order. Vital signs and labs reviewed 10/29/22 by Dr.K. Faythe Ghee to proceed with treatment today.  Velcade given today per MD orders. Tolerated infusion without adverse affects. Vital signs stable. No complaints at this time. Discharged from clinic ambulatory in stable condition. Alert and oriented x 3. F/U with [facility] as scheduled.

## 2022-11-01 NOTE — Patient Instructions (Signed)
[facility] [place]  Discharge Instructions: Thank you for choosing [facility] to provide your oncology and hematology care.  If you have a lab appointment with the Cancer Center, please come in thru the Main Entrance and check in at the main information desk.  Wear comfortable clothing and clothing appropriate for easy access to any Portacath or PICC line.   We strive to give you quality time with your provider. You may need to reschedule your appointment if you arrive late (15 or more minutes).  Arriving late affects you and other patients whose appointments are after yours.  Also, if you miss three or more appointments without notifying the office, you may be dismissed from the clinic at the provider's discretion.      For prescription refill requests, have your pharmacy contact our office and allow 72 hours for refills to be completed.    Today you received the following chemotherapy and/or immunotherapy agents Velcade   To help prevent nausea and vomiting after your treatment, we encourage you to take your nausea medication as directed.  Bortezomib Injection What is this medication? BORTEZOMIB (bor TEZ oh mib) treats lymphoma. It may also be used to treat multiple myeloma, a type of bone marrow cancer. It works by blocking a protein that causes cancer cells to grow and multiply. This helps to slow or stop the spread of cancer cells. This medicine may be used for other purposes; ask your health care provider or pharmacist if you have questions. COMMON BRAND NAME(S): Velcade What should I tell my care team before I take this medication? They need to know if you have any of these conditions: Dehydration Diabetes Heart disease Liver disease Tingling of the fingers or toes or other nerve disorder An unusual or allergic reaction to bortezomib, other medications, foods, dyes, or preservatives If you or your partner are pregnant or trying to get  pregnant Breastfeeding How should I use this medication? This medication is injected into a vein or under the skin. It is given by your care team in a hospital or clinic setting. Talk to your care team about the use of this medication in children. Special care may be needed. Overdosage: If you think you have taken too much of this medicine contact a poison control center or emergency room at once. NOTE: This medicine is only for you. Do not share this medicine with others. What if I miss a dose? Keep appointments for follow-up doses. It is important not to miss your dose. Call your care team if you are unable to keep an appointment. What may interact with this medication? Ketoconazole Rifampin This list may not describe all possible interactions. Give your health care provider a list of all the medicines, herbs, non-prescription drugs, or dietary supplements you use. Also tell them if you smoke, drink alcohol, or use illegal drugs. Some items may interact with your medicine. What should I watch for while using this medication? Your condition will be monitored carefully while you are receiving this medication. You may need blood work while taking this medication. This medication may affect your coordination, reaction time, or judgment. Do not drive or operate machinery until you know how this medication affects you. Sit up or stand slowly to reduce the risk of dizzy or fainting spells. Drinking alcohol with this medication can increase the risk of these side effects. This medication may increase your risk of getting an infection. Call your care team for advice if you get a   fever, chills, sore throat, or other symptoms of a cold or flu. Do not treat yourself. Try to avoid being around people who are sick. Check with your care team if you have severe diarrhea, nausea, and vomiting, or if you sweat a lot. The loss of too much body fluid may make it dangerous for you to take this medication. Talk to  your care team if you may be pregnant. Serious birth defects can occur if you take this medication during pregnancy and for 7 months after the last dose. You will need a negative pregnancy test before starting this medication. Contraception is recommended while taking this medication and for 7 months after the last dose. Your care team can help you find the option that works for you. If your partner can get pregnant, use a condom during sex while taking this medication and for 4 months after the last dose. Do not breastfeed while taking this medication and for 2 months after the last dose. This medication may cause infertility. Talk to your care team if you are concerned about your fertility. What side effects may I notice from receiving this medication? Side effects that you should report to your care team as soon as possible: Allergic reactions--skin rash, itching, hives, swelling of the face, lips, tongue, or throat Bleeding--bloody or black, tar-like stools, vomiting blood or brown material that looks like coffee grounds, red or dark brown urine, small red or purple spots on skin, unusual bruising or bleeding Bleeding in the brain--severe headache, stiff neck, confusion, dizziness, change in vision, numbness or weakness of the face, arm, or leg, trouble speaking, trouble walking, vomiting Bowel blockage--stomach cramping, unable to have a bowel movement or pass gas, loss of appetite, vomiting Heart failure--shortness of breath, swelling of the ankles, feet, or hands, sudden weight gain, unusual weakness or fatigue Infection--fever, chills, cough, sore throat, wounds that don't heal, pain or trouble when passing urine, general feeling of discomfort or being unwell Liver injury--right upper belly pain, loss of appetite, nausea, light-colored stool, dark yellow or brown urine, yellowing skin or eyes, unusual weakness or fatigue Low blood pressure--dizziness, feeling faint or lightheaded, blurry  vision Lung injury--shortness of breath or trouble breathing, cough, spitting up blood, chest pain, fever Pain, tingling, or numbness in the hands or feet Severe or prolonged diarrhea Stomach pain, bloody diarrhea, pale skin, unusual weakness or fatigue, decrease in the amount of urine, which may be signs of hemolytic uremic syndrome Sudden and severe headache, confusion, change in vision, seizures, which may be signs of posterior reversible encephalopathy syndrome (PRES) TTP--purple spots on the skin or inside the mouth, pale skin, yellowing skin or eyes, unusual weakness or fatigue, fever, fast or irregular heartbeat, confusion, change in vision, trouble speaking, trouble walking Tumor lysis syndrome (TLS)--nausea, vomiting, diarrhea, decrease in the amount of urine, dark urine, unusual weakness or fatigue, confusion, muscle pain or cramps, fast or irregular heartbeat, joint pain Side effects that usually do not require medical attention (report to your care team if they continue or are bothersome): Constipation Diarrhea Fatigue Loss of appetite Nausea This list may not describe all possible side effects. Call your doctor for medical advice about side effects. You may report side effects to FDA at 1-800-FDA-1088. Where should I keep my medication? This medication is given in a hospital or clinic. It will not be stored at home. NOTE: This sheet is a summary. It may not cover all possible information. If you have questions about this medicine, talk to your   doctor, pharmacist, or health care provider.  2023 Elsevier/Gold Standard (2022-01-03 00:00:00)   BELOW ARE SYMPTOMS THAT SHOULD BE REPORTED IMMEDIATELY: *FEVER GREATER THAN 100.4 F (38 C) OR HIGHER *CHILLS OR SWEATING *NAUSEA AND VOMITING THAT IS NOT CONTROLLED WITH YOUR NAUSEA MEDICATION *UNUSUAL SHORTNESS OF BREATH *UNUSUAL BRUISING OR BLEEDING *URINARY PROBLEMS (pain or burning when urinating, or frequent urination) *BOWEL  PROBLEMS (unusual diarrhea, constipation, pain near the anus) TENDERNESS IN MOUTH AND THROAT WITH OR WITHOUT PRESENCE OF ULCERS (sore throat, sores in mouth, or a toothache) UNUSUAL RASH, SWELLING OR PAIN  UNUSUAL VAGINAL DISCHARGE OR ITCHING   Items with * indicate a potential emergency and should be followed up as soon as possible or go to the Emergency Department if any problems should occur.  Please show the CHEMOTHERAPY ALERT CARD or IMMUNOTHERAPY ALERT CARD at check-in to the Emergency Department and triage nurse.  Should you have questions after your visit or need to cancel or reschedule your appointment, please contact [facility] [phone]  and follow the prompts.  Office hours are 8:00 a.m. to 4:30 p.m. Monday - Friday. Please note that voicemails left after 4:00 p.m. may not be returned until the following business day.  We are closed weekends and major holidays. You have access to a nurse at all times for urgent questions. Please call the main number to the clinic [phone] and follow the prompts.  For any non-urgent questions, you may also contact your provider using MyChart. We now offer e-Visits for anyone 18 and older to request care online for non-urgent symptoms. For details visit [email or web address].   Also download the MyChart app! Go to the app store, search "MyChart", open the app, select [place], and log in with your MyChart username and password.   

## 2022-11-05 ENCOUNTER — Inpatient Hospital Stay: Payer: 59

## 2022-11-05 ENCOUNTER — Other Ambulatory Visit: Payer: Self-pay | Admitting: Physician Assistant

## 2022-11-05 ENCOUNTER — Other Ambulatory Visit: Payer: Self-pay | Admitting: *Deleted

## 2022-11-05 DIAGNOSIS — C9000 Multiple myeloma not having achieved remission: Secondary | ICD-10-CM

## 2022-11-05 DIAGNOSIS — Z5112 Encounter for antineoplastic immunotherapy: Secondary | ICD-10-CM | POA: Diagnosis not present

## 2022-11-05 LAB — CBC WITH DIFFERENTIAL/PLATELET
Abs Immature Granulocytes: 0 10*3/uL (ref 0.00–0.07)
Basophils Absolute: 0 10*3/uL (ref 0.0–0.1)
Basophils Relative: 1 %
Eosinophils Absolute: 0.1 10*3/uL (ref 0.0–0.5)
Eosinophils Relative: 4 %
HCT: 31 % — ABNORMAL LOW (ref 36.0–46.0)
Hemoglobin: 9.9 g/dL — ABNORMAL LOW (ref 12.0–15.0)
Immature Granulocytes: 0 %
Lymphocytes Relative: 20 %
Lymphs Abs: 0.6 10*3/uL — ABNORMAL LOW (ref 0.7–4.0)
MCH: 33.8 pg (ref 26.0–34.0)
MCHC: 31.9 g/dL (ref 30.0–36.0)
MCV: 105.8 fL — ABNORMAL HIGH (ref 80.0–100.0)
Monocytes Absolute: 0.3 10*3/uL (ref 0.1–1.0)
Monocytes Relative: 12 %
Neutro Abs: 1.8 10*3/uL (ref 1.7–7.7)
Neutrophils Relative %: 63 %
Platelets: 134 10*3/uL — ABNORMAL LOW (ref 150–400)
RBC: 2.93 MIL/uL — ABNORMAL LOW (ref 3.87–5.11)
RDW: 12.9 % (ref 11.5–15.5)
WBC: 2.8 10*3/uL — ABNORMAL LOW (ref 4.0–10.5)
nRBC: 0 % (ref 0.0–0.2)

## 2022-11-05 LAB — COMPREHENSIVE METABOLIC PANEL
ALT: 20 U/L (ref 0–44)
AST: 19 U/L (ref 15–41)
Albumin: 4 g/dL (ref 3.5–5.0)
Alkaline Phosphatase: 54 U/L (ref 38–126)
Anion gap: 9 (ref 5–15)
BUN: 26 mg/dL — ABNORMAL HIGH (ref 8–23)
CO2: 26 mmol/L (ref 22–32)
Calcium: 9.2 mg/dL (ref 8.9–10.3)
Chloride: 104 mmol/L (ref 98–111)
Creatinine, Ser: 1.4 mg/dL — ABNORMAL HIGH (ref 0.44–1.00)
GFR, Estimated: 39 mL/min — ABNORMAL LOW (ref >60)
Glucose, Bld: 139 mg/dL — ABNORMAL HIGH (ref 70–99)
Potassium: 4 mmol/L (ref 3.5–5.1)
Sodium: 139 mmol/L (ref 135–145)
Total Bilirubin: 0.7 mg/dL (ref 0.3–1.2)
Total Protein: 6.6 g/dL (ref 6.5–8.1)

## 2022-11-05 LAB — MAGNESIUM: Magnesium: 2.1 mg/dL (ref 1.7–2.4)

## 2022-11-05 MED ORDER — BORTEZOMIB CHEMO SQ INJECTION 3.5 MG (2.5MG/ML)
1.0000 mg/m2 | Freq: Once | INTRAMUSCULAR | Status: AC
  Administered 2022-11-05: 2 mg via SUBCUTANEOUS
  Filled 2022-11-05: qty 0.8

## 2022-11-05 MED ORDER — HEPARIN SOD (PORK) LOCK FLUSH 100 UNIT/ML IV SOLN
500.0000 [IU] | Freq: Once | INTRAVENOUS | Status: AC
  Administered 2022-11-05: 500 [IU] via INTRAVENOUS

## 2022-11-05 MED ORDER — ALPRAZOLAM 0.25 MG PO TABS: ORAL_TABLET | ORAL | 2 refills | Status: DC

## 2022-11-05 MED ORDER — SODIUM CHLORIDE 0.9% FLUSH
10.0000 mL | Freq: Once | INTRAVENOUS | Status: AC
  Administered 2022-11-05: 10 mL via INTRAVENOUS

## 2022-11-05 NOTE — Patient Instructions (Signed)
[facility] [place]  Discharge Instructions: Thank you for choosing [facility] to provide your oncology and hematology care.  If you have a lab appointment with the Cancer Center, please come in thru the Main Entrance and check in at the main information desk.  Wear comfortable clothing and clothing appropriate for easy access to any Portacath or PICC line.   We strive to give you quality time with your provider. You may need to reschedule your appointment if you arrive late (15 or more minutes).  Arriving late affects you and other patients whose appointments are after yours.  Also, if you miss three or more appointments without notifying the office, you may be dismissed from the clinic at the provider's discretion.      For prescription refill requests, have your pharmacy contact our office and allow 72 hours for refills to be completed.    Today you received the following chemotherapy and/or immunotherapy agents Velcade      To help prevent nausea and vomiting after your treatment, we encourage you to take your nausea medication as directed.  BELOW ARE SYMPTOMS THAT SHOULD BE REPORTED IMMEDIATELY: *FEVER GREATER THAN 100.4 F (38 C) OR HIGHER *CHILLS OR SWEATING *NAUSEA AND VOMITING THAT IS NOT CONTROLLED WITH YOUR NAUSEA MEDICATION *UNUSUAL SHORTNESS OF BREATH *UNUSUAL BRUISING OR BLEEDING *URINARY PROBLEMS (pain or burning when urinating, or frequent urination) *BOWEL PROBLEMS (unusual diarrhea, constipation, pain near the anus) TENDERNESS IN MOUTH AND THROAT WITH OR WITHOUT PRESENCE OF ULCERS (sore throat, sores in mouth, or a toothache) UNUSUAL RASH, SWELLING OR PAIN  UNUSUAL VAGINAL DISCHARGE OR ITCHING   Items with * indicate a potential emergency and should be followed up as soon as possible or go to the Emergency Department if any problems should occur.  Please show the CHEMOTHERAPY ALERT CARD or IMMUNOTHERAPY ALERT CARD at check-in to the Emergency  Department and triage nurse.  Should you have questions after your visit or need to cancel or reschedule your appointment, please contact [facility] [place] [phone]  and follow the prompts.  Office hours are 8:00 a.m. to 4:30 p.m. Monday - Friday. Please note that voicemails left after 4:00 p.m. may not be returned until the following business day.  We are closed weekends and major holidays. You have access to a nurse at all times for urgent questions. Please call the main number to the clinic [phone] and follow the prompts.  For any non-urgent questions, you may also contact your provider using MyChart. We now offer e-Visits for anyone 18 and older to request care online for non-urgent symptoms. For details visit [email or web address].   Also download the MyChart app! Go to the app store, search "MyChart", open the app, select [place], and log in with your MyChart username and password.   

## 2022-11-05 NOTE — Progress Notes (Signed)
Patient presents today for Velcade injection per providers order.  Vital signs within parameters for treatment.  Labs pending.  Patient took steroids at home.  No new complaints at this time.  Labs within parameters for treatment.   Stable during administration without incident; injection site WNL; see MAR for injection details.  Patient tolerated procedure well and without incident.  No questions or complaints noted at this time. Discharge from clinic ambulatory in stable condition.  Alert and oriented X 3.  Follow up with [facility] as scheduled.

## 2022-11-06 ENCOUNTER — Encounter: Payer: Self-pay | Admitting: Hematology

## 2022-11-08 ENCOUNTER — Inpatient Hospital Stay: Payer: 59

## 2022-11-08 ENCOUNTER — Inpatient Hospital Stay: Payer: 59 | Admitting: Dietician

## 2022-11-08 VITALS — BP 126/72 | HR 76 | Temp 98.0°F | Resp 20

## 2022-11-08 DIAGNOSIS — C9000 Multiple myeloma not having achieved remission: Secondary | ICD-10-CM

## 2022-11-08 DIAGNOSIS — Z5112 Encounter for antineoplastic immunotherapy: Secondary | ICD-10-CM | POA: Diagnosis not present

## 2022-11-08 MED ORDER — BORTEZOMIB CHEMO SQ INJECTION 3.5 MG (2.5MG/ML)
1.0000 mg/m2 | Freq: Once | INTRAMUSCULAR | Status: AC
  Administered 2022-11-08: 2 mg via SUBCUTANEOUS
  Filled 2022-11-08: qty 0.8

## 2022-11-08 NOTE — Patient Instructions (Signed)
[facility] [place]  Discharge Instructions: Thank you for choosing [facility] to provide your oncology and hematology care.  If you have a lab appointment with the Cancer Center, please come in thru the Main Entrance and check in at the main information desk.  Wear comfortable clothing and clothing appropriate for easy access to any Portacath or PICC line.   We strive to give you quality time with your provider. You may need to reschedule your appointment if you arrive late (15 or more minutes).  Arriving late affects you and other patients whose appointments are after yours.  Also, if you miss three or more appointments without notifying the office, you may be dismissed from the clinic at the provider's discretion.      For prescription refill requests, have your pharmacy contact our office and allow 72 hours for refills to be completed.    Today you received the following chemotherapy and/or immunotherapy agents Velcade      To help prevent nausea and vomiting after your treatment, we encourage you to take your nausea medication as directed.  BELOW ARE SYMPTOMS THAT SHOULD BE REPORTED IMMEDIATELY: *FEVER GREATER THAN 100.4 F (38 C) OR HIGHER *CHILLS OR SWEATING *NAUSEA AND VOMITING THAT IS NOT CONTROLLED WITH YOUR NAUSEA MEDICATION *UNUSUAL SHORTNESS OF BREATH *UNUSUAL BRUISING OR BLEEDING *URINARY PROBLEMS (pain or burning when urinating, or frequent urination) *BOWEL PROBLEMS (unusual diarrhea, constipation, pain near the anus) TENDERNESS IN MOUTH AND THROAT WITH OR WITHOUT PRESENCE OF ULCERS (sore throat, sores in mouth, or a toothache) UNUSUAL RASH, SWELLING OR PAIN  UNUSUAL VAGINAL DISCHARGE OR ITCHING   Items with * indicate a potential emergency and should be followed up as soon as possible or go to the Emergency Department if any problems should occur.  Please show the CHEMOTHERAPY ALERT CARD or IMMUNOTHERAPY ALERT CARD at check-in to the Emergency  Department and triage nurse.  Should you have questions after your visit or need to cancel or reschedule your appointment, please contact [facility] AT  [phone]  and follow the prompts.  Office hours are 8:00 a.m. to 4:30 p.m. Monday - Friday. Please note that voicemails left after 4:00 p.m. may not be returned until the following business day.  We are closed weekends and major holidays. You have access to a nurse at all times for urgent questions. Please call the main number to the clinic [phone] and follow the prompts.  For any non-urgent questions, you may also contact your provider using MyChart. We now offer e-Visits for anyone 18 and older to request care online for non-urgent symptoms. For details visit [email or web address].   Also download the MyChart app! Go to the app store, search "MyChart", open the app, select [place], and log in with your MyChart username and password.   

## 2022-11-08 NOTE — Progress Notes (Signed)
Patient presents today for Velcade injection per providers order.  Vital signs and labs within parameters for treatment.  Patient has no new complaints at this time.  Stable during administration without incident; injection site WNL; see MAR for injection details.  Patient tolerated procedure well and without incident.  No questions or complaints noted at this time.  

## 2022-11-08 NOTE — Progress Notes (Signed)
Nutrition Follow-up:  Patient with multiple myeloma. She is receiving velcade q28d.  Met with patient during infusion. She reports appetite is too good at times. Patient eating 2 meals and light/snack supper. Patient including good sources of protein. She is drinking 6-8 bottles of water. Patient denies nausea, vomiting. She reports intermittent constipation/diarrhea. She reports taking medications as needed which work well.    Medications: reviewed   Labs: reviewed   Anthropometrics: Wt 170 lb 9.6 oz today   3/11 - 166 lb 14.4 oz  2/19 - 165 lb 6.4 oz   NUTRITION DIAGNOSIS: Unintended weight loss stable     INTERVENTION:  Continue eating small frequent meals and snacks with adequate calories and protein   MONITORING, EVALUATION, GOAL: weight trends, intake    NEXT VISIT: To be scheduled as needed

## 2022-11-08 NOTE — Progress Notes (Signed)
Received notice from Dr Delton Coombes to add 5 more q 2 week doses of Darzalex Faspro to patients treatment plan to total the 8 needed.  Next dose will be on 11/12/22.  T.O. Dr Rhys Martini, PharmD

## 2022-11-12 ENCOUNTER — Inpatient Hospital Stay: Payer: 59

## 2022-11-12 ENCOUNTER — Other Ambulatory Visit: Payer: Self-pay | Admitting: *Deleted

## 2022-11-12 VITALS — BP 131/78 | HR 83 | Temp 97.4°F | Resp 18 | Wt 171.8 lb

## 2022-11-12 DIAGNOSIS — C9000 Multiple myeloma not having achieved remission: Secondary | ICD-10-CM

## 2022-11-12 DIAGNOSIS — Z95828 Presence of other vascular implants and grafts: Secondary | ICD-10-CM

## 2022-11-12 DIAGNOSIS — Z5112 Encounter for antineoplastic immunotherapy: Secondary | ICD-10-CM | POA: Diagnosis not present

## 2022-11-12 LAB — CBC WITH DIFFERENTIAL/PLATELET
Abs Immature Granulocytes: 0.01 10*3/uL (ref 0.00–0.07)
Basophils Absolute: 0 10*3/uL (ref 0.0–0.1)
Basophils Relative: 1 %
Eosinophils Absolute: 0.1 10*3/uL (ref 0.0–0.5)
Eosinophils Relative: 4 %
HCT: 30.2 % — ABNORMAL LOW (ref 36.0–46.0)
Hemoglobin: 9.6 g/dL — ABNORMAL LOW (ref 12.0–15.0)
Immature Granulocytes: 0 %
Lymphocytes Relative: 14 %
Lymphs Abs: 0.5 10*3/uL — ABNORMAL LOW (ref 0.7–4.0)
MCH: 34.3 pg — ABNORMAL HIGH (ref 26.0–34.0)
MCHC: 31.8 g/dL (ref 30.0–36.0)
MCV: 107.9 fL — ABNORMAL HIGH (ref 80.0–100.0)
Monocytes Absolute: 0.4 10*3/uL (ref 0.1–1.0)
Monocytes Relative: 11 %
Neutro Abs: 2.4 10*3/uL (ref 1.7–7.7)
Neutrophils Relative %: 70 %
Platelets: 108 10*3/uL — ABNORMAL LOW (ref 150–400)
RBC: 2.8 MIL/uL — ABNORMAL LOW (ref 3.87–5.11)
RDW: 12.7 % (ref 11.5–15.5)
WBC: 3.4 10*3/uL — ABNORMAL LOW (ref 4.0–10.5)
nRBC: 0 % (ref 0.0–0.2)

## 2022-11-12 MED ORDER — SODIUM CHLORIDE 0.9% FLUSH
10.0000 mL | Freq: Once | INTRAVENOUS | Status: AC
  Administered 2022-11-12: 10 mL via INTRAVENOUS

## 2022-11-12 MED ORDER — HEPARIN SOD (PORK) LOCK FLUSH 100 UNIT/ML IV SOLN
500.0000 [IU] | Freq: Once | INTRAVENOUS | Status: AC
  Administered 2022-11-12: 500 [IU] via INTRAVENOUS

## 2022-11-12 MED ORDER — DARATUMUMAB-HYALURONIDASE-FIHJ 1800-30000 MG-UT/15ML ~~LOC~~ SOLN
1800.0000 mg | Freq: Once | SUBCUTANEOUS | Status: AC
  Administered 2022-11-12: 1800 mg via SUBCUTANEOUS
  Filled 2022-11-12: qty 15

## 2022-11-12 NOTE — Patient Instructions (Signed)
[facility]  Discharge Instructions: Thank you for choosing [facility] to provide your oncology and hematology care.  If you have a lab appointment with the Cancer Center, please come in thru the Main Entrance and check in at the main information desk.  Wear comfortable clothing and clothing appropriate for easy access to any Portacath or PICC line.   We strive to give you quality time with your provider. You may need to reschedule your appointment if you arrive late (15 or more minutes).  Arriving late affects you and other patients whose appointments are after yours.  Also, if you miss three or more appointments without notifying the office, you may be dismissed from the clinic at the provider's discretion.      For prescription refill requests, have your pharmacy contact our office and allow 72 hours for refills to be completed.    Today you received the following chemotherapy and/or immunotherapy agents Darzalex Faspro. Daratumumab; Hyaluronidase Injection What is this medication? DARATUMUMAB; HYALURONIDASE (dar a toom ue mab; hye al ur ON i dase) treats multiple myeloma, a type of bone marrow cancer. Daratumumab works by blocking a protein that causes cancer cells to grow and multiply. This helps to slow or stop the spread of cancer cells. Hyaluronidase works by increasing the absorption of other medications in the body to help them work better. This medication may also be used treat amyloidosis, a condition that causes the buildup of a protein (amyloid) in your body. It works by reducing the buildup of this protein, which decreases symptoms. It is a combination medication that contains a monoclonal antibody. This medicine may be used for other purposes; ask your health care provider or pharmacist if you have questions. COMMON BRAND NAME(S): DARZALEX FASPRO What should I tell my care team before I take this medication? They need to know if you have any of  these conditions: Heart disease Infection, such as chickenpox, cold sores, herpes, hepatitis B Lung or breathing disease An unusual or allergic reaction to daratumumab, hyaluronidase, other medications, foods, dyes, or preservatives Pregnant or trying to get pregnant Breast-feeding How should I use this medication? This medication is injected under the skin. It is given by your care team in a hospital or clinic setting. Talk to your care team about the use of this medication in children. Special care may be needed. Overdosage: If you think you have taken too much of this medicine contact a poison control center or emergency room at once. NOTE: This medicine is only for you. Do not share this medicine with others. What if I miss a dose? Keep appointments for follow-up doses. It is important not to miss your dose. Call your care team if you are unable to keep an appointment. What may interact with this medication? Interactions have not been studied. This list may not describe all possible interactions. Give your health care provider a list of all the medicines, herbs, non-prescription drugs, or dietary supplements you use. Also tell them if you smoke, drink alcohol, or use illegal drugs. Some items may interact with your medicine. What should I watch for while using this medication? Your condition will be monitored carefully while you are receiving this medication. This medication can cause serious allergic reactions. To reduce your risk, your care team may give you other medication to take before receiving this one. Be sure to follow the directions from your care team. This medication can affect the results of blood tests to match   your blood type. These changes can last for up to 6 months after the final dose. Your care team will do blood tests to match your blood type before you start treatment. Tell all of your care team that you are being treated with this medication before receiving a blood  transfusion. This medication can affect the results of some tests used to determine treatment response; extra tests may be needed to evaluate response. Talk to your care team if you wish to become pregnant or think you are pregnant. This medication can cause serious birth defects if taken during pregnancy and for 3 months after the last dose. A reliable form of contraception is recommended while taking this medication and for 3 months after the last dose. Talk to your care team about effective forms of contraception. Do not breast-feed while taking this medication. What side effects may I notice from receiving this medication? Side effects that you should report to your care team as soon as possible: Allergic reactions--skin rash, itching, hives, swelling of the face, lips, tongue, or throat Heart rhythm changes--fast or irregular heartbeat, dizziness, feeling faint or lightheaded, chest pain, trouble breathing Infection--fever, chills, cough, sore throat, wounds that don't heal, pain or trouble when passing urine, general feeling of discomfort or being unwell Infusion reactions--chest pain, shortness of breath or trouble breathing, feeling faint or lightheaded Sudden eye pain or change in vision such as blurry vision, seeing halos around lights, vision loss Unusual bruising or bleeding Side effects that usually do not require medical attention (report to your care team if they continue or are bothersome): Constipation Diarrhea Fatigue Nausea Pain, tingling, or numbness in the hands or feet Swelling of the ankles, hands, or feet This list may not describe all possible side effects. Call your doctor for medical advice about side effects. You may report side effects to FDA at 1-800-FDA-1088. Where should I keep my medication? This medication is given in a hospital or clinic. It will not be stored at home. NOTE: This sheet is a summary. It may not cover all possible information. If you have  questions about this medicine, talk to your doctor, pharmacist, or health care provider.  2023 Elsevier/Gold Standard (2021-11-29 00:00:00)       To help prevent nausea and vomiting after your treatment, we encourage you to take your nausea medication as directed.  BELOW ARE SYMPTOMS THAT SHOULD BE REPORTED IMMEDIATELY: *FEVER GREATER THAN 100.4 F (38 C) OR HIGHER *CHILLS OR SWEATING *NAUSEA AND VOMITING THAT IS NOT CONTROLLED WITH YOUR NAUSEA MEDICATION *UNUSUAL SHORTNESS OF BREATH *UNUSUAL BRUISING OR BLEEDING *URINARY PROBLEMS (pain or burning when urinating, or frequent urination) *BOWEL PROBLEMS (unusual diarrhea, constipation, pain near the anus) TENDERNESS IN MOUTH AND THROAT WITH OR WITHOUT PRESENCE OF ULCERS (sore throat, sores in mouth, or a toothache) UNUSUAL RASH, SWELLING OR PAIN  UNUSUAL VAGINAL DISCHARGE OR ITCHING   Items with * indicate a potential emergency and should be followed up as soon as possible or go to the Emergency Department if any problems should occur.  Please show the CHEMOTHERAPY ALERT CARD or IMMUNOTHERAPY ALERT CARD at check-in to the Emergency Department and triage nurse.  Should you have questions after your visit or need to cancel or reschedule your appointment, please contact [facility] [phone]  and follow the prompts.  Office hours are 8:00 a.m. to 4:30 p.m. Monday - Friday. Please note that voicemails left after 4:00 p.m. may not be returned until the following business day.    We are closed weekends and major holidays. You have access to a nurse at all times for urgent questions. Please call the main number to the clinic [phone] and follow the prompts.  For any non-urgent questions, you may also contact your provider using MyChart. We now offer e-Visits for anyone 18 and older to request care online for non-urgent symptoms. For details visit [email or web address].   Also download the MyChart app! Go to the app  store, search "MyChart", open the app, select [facility], and log in with your MyChart username and password.   

## 2022-11-12 NOTE — Progress Notes (Signed)
Patient presents today for Darzalex Faspro injection. Vital signs within parameters for treatment. Labs within parameters for treatment. Patient took Decadron 20 mg, Tylenol 650 mg and Benadryl 50 mg prior to arrival at 0830 am. Patient has no complaints of any changes since her last treatment.    Discharged from clinic ambulatory in stable condition. Alert and oriented x 3. F/U with [facility] as scheduled.

## 2022-11-14 ENCOUNTER — Encounter: Payer: Self-pay | Admitting: Hematology

## 2022-11-18 NOTE — Progress Notes (Signed)
[facility] [address], [place] [address]    Clinic Day:  11/19/2022  Referring physician: Alvina Filbert, MD  Patient Care Team: Alvina Filbert, MD as PCP - General (Internal Medicine) Doreatha Massed, MD as Medical Oncologist (Medical Oncology) Lanelle Bal, DO as Consulting Physician (Gastroenterology)   ASSESSMENT & PLAN:   Assessment: 1.  IgG lambda plasma cell myeloma, stage II, standard risk: -4 cycles of KPD from 04/17/2018 through 08/21/2018, stem cell transplant on 10/23/2018. -PET scan on 01/21/2019 showed multiple bone lesions but without any hypermetabolic activity. -BMBX on 01/21/2019 with normocellular marrow with no increase in plasma cells.  Normal FISH.  MRD results negative. -Maintenance pomalidomide 2 mg 3 weeks on/1 week off started on 03/10/2019. -Bone marrow biopsy on 10/29/2019 shows trilineage hematopoiesis with no evidence of plasma cells.  Chromosome analysis and FISH are normal. -BM BX on 10/27/2020 at [facility]-normocellular marrow with 30 to 40%, TLH.  2% of the total cells are CD 138+ plasma cells. - Pomalyst was held due to dizziness from 03/29/2021 through 04/26/2021. - MRI of the brain on 03/31/2021 did not show any evidence of intracranial lesions.  Multiple bone lesions in the skull compatible with sequela of myeloma.  - Labs at [facility] on 11/16/2021: M spike 0.24 g.  Immunofixation positive for IgG lambda. - PET scan on 12/14/2021: No evidence of FDG avid osseous or soft tissue myeloma. - She has developed left shoulder blade and right posterior rib pain for the last 3 to 4 months. - 24-hour urine on 12/14/2021: Total protein 136 mg.  Immune immunofixation positive for lambda type Bence-Jones protein. -- PET scan (05/24/2022): Single new hypermetabolic lytic lesion involving left lamina of T11.  No canal encroachment.  No other hypermetabolic bone lesions.  Stable diffuse lytic lesions throughout the axial and appendicular skeleton.  No  soft tissue lesions. - Maintenance Pomalyst discontinued on 05/29/2022 - XRT to the T11 completed on 06/20/2022 - Daratumumab, Velcade and dexamethasone started on 06/11/2022   2.  Pulmonary embolism: -CT angiogram on 04/21/2020 showed filling defect at [facility]. -VQ scan confirmed pulmonary embolism. -She is on Eliquis.   3.  Right shoulder and upper arm pain: - She received XRT to the chest from 12/26/2020 through 01/06/2021.   4.  Myeloma bone disease: - Denosumab held due to exposure of bone in the right lower jaw.  Last dose in 2020.  Plan: 1.  IgG lambda plasma cell myeloma: - Reviewed recent myeloma labs from [facility] from 11/15/2022.  Lambda light chains 10.45, ratio 0.37.  M spike is less than 0.2 g/dL. - Labs today: LFTs normal.  Creatinine stable at 1.36.  White count and platelet count is normal to proceed with cycle 6 today.  RTC 3 weeks for follow-up.   2.  Back pain/left shoulder blade pain/right lateral and posterior rib pains: - Continue hydrocodone 10/325 every 6 hours as needed.   3.  Hypomagnesemia: - Continue magnesium twice daily.   4.  Anxiety: - Continue Xanax 0.25 mg in the morning send 0.5 mg at bedtime.   5.  Sleeping difficulty: - Continue Ambien 10 mg at bedtime as needed.   6.  Peripheral neuropathy: - She developed numbness in the feet for the last 1 month. - Continue gabapentin 100 mg in the mornings and 200 mg at bedtime which was started on 11/15/2022 by Dr. Rosaria Ferries.   7.  Pulmonary embolism: - Continue Eliquis daily.  No  bleeding issues.  Orders Placed This Encounter  Procedures   CBC with Differential    Standing Status:   Future    Standing Expiration Date:   12/25/2023   CBC with Differential    Standing Status:   Future    Standing Expiration Date:   02/18/2024   CBC with Differential    Standing Status:   Future    Standing Expiration Date:   03/17/2024   CBC with Differential    Standing Status:   Future     Standing Expiration Date:   04/14/2024      I,Katie Daubenspeck,acting as a scribe for Doreatha Massed, MD.,have documented all relevant documentation on the behalf of Doreatha Massed, MD,as directed by  Doreatha Massed, MD while in the presence of Doreatha Massed, MD.   I, Doreatha Massed MD, have reviewed the above documentation for accuracy and completeness, and I agree with the above.   Doreatha Massed, MD   4/1/20246:20 PM  CHIEF COMPLAINT:   Diagnosis: multiple myeloma    Cancer Staging  No matching staging information was found for the patient.   Prior Therapy: 1. KPD x 4 cycles from 04/17/2018 to 08/21/2018. 2. Stem cell transplant on 10/23/2018. 3. Radiation to left and right humerus 25 Gy in 10 fractions from 08/23/2020 to 08/25/2020. 4.  Maintenance Pomalyst until 06/08/2022  Current Therapy:  Darzalex, Velcade and dexamethasone started on 06/11/2022    HISTORY OF PRESENT ILLNESS:   Oncology History  Multiple myeloma not having achieved remission  04/07/2018 Initial Diagnosis   Multiple myeloma not having achieved remission (HCC)   06/11/2022 -  Chemotherapy   Patient is on Treatment Plan : MYELOMA RELAPSED / REFRACTORY Daratumumab SQ + Bortezomib + Dexamethasone (DaraVd) q21d / Daratumumab SQ q28d      Multiple myeloma without remission  04/15/2018 Initial Diagnosis   Multiple myeloma without remission (HCC)   04/17/2018 - 09/05/2018 Chemotherapy   The patient had dexamethasone (DECADRON) 4 MG tablet, 1 of 1 cycle, Start date: 04/15/2018, End date: 05/26/2018 palonosetron (ALOXI) injection 0.25 mg, 0.25 mg, Intravenous,  Once, 1 of 1 cycle Administration: 0.25 mg (04/17/2018), 0.25 mg (04/24/2018), 0.25 mg (05/01/2018) cyclophosphamide (CYTOXAN) 540 mg in sodium chloride 0.9 % 250 mL chemo infusion, 300 mg/m2 = 540 mg, Intravenous,  Once, 1 of 1 cycle Administration: 540 mg (04/17/2018), 540 mg (04/24/2018), 540 mg (05/01/2018) carfilzomib (KYPROLIS)  36 mg in dextrose 5 % 50 mL chemo infusion, 20 mg/m2 = 36 mg, Intravenous, Once, 5 of 5 cycles Administration: 36 mg (04/17/2018), 36 mg (04/18/2018), 60 mg (04/24/2018), 60 mg (04/25/2018), 60 mg (05/01/2018), 60 mg (05/02/2018), 60 mg (05/15/2018), 60 mg (05/16/2018), 60 mg (06/12/2018), 60 mg (06/13/2018), 60 mg (06/26/2018), 60 mg (06/27/2018), 60 mg (07/03/2018), 60 mg (07/04/2018), 60 mg (07/24/2018), 60 mg (07/25/2018), 60 mg (07/31/2018), 60 mg (08/01/2018), 60 mg (08/08/2018), 60 mg (08/07/2018), 60 mg (08/21/2018), 60 mg (08/22/2018), 60 mg (08/28/2018), 60 mg (08/29/2018), 60 mg (09/04/2018), 60 mg (09/05/2018)  for chemotherapy treatment.    06/11/2022 -  Chemotherapy   Patient is on Treatment Plan : MYELOMA RELAPSED / REFRACTORY Daratumumab SQ + Bortezomib + Dexamethasone (DaraVd) q21d / Daratumumab SQ q28d         INTERVAL HISTORY:   Alexandra Price is a 77 y.o. female presenting to clinic today for follow up of multiple myeloma. She was last seen by me on 10/29/22.  Since her last visit, she was seen for follow up with Dr. Rosaria Ferries on 11/15/22.  Today,  she states that she is doing well overall. Her appetite level is at 100%. Her energy level is at 10%.  PAST MEDICAL HISTORY:   Past Medical History: Past Medical History:  Diagnosis Date   Anxiety    Chronic kidney disease    Depression    Hypertension    Multiple myeloma (HCC)    multiple myeloma   Pre-diabetes     Surgical History: Past Surgical History:  Procedure Laterality Date   ABDOMINAL HYSTERECTOMY     total   APPENDECTOMY     BALLOON DILATION N/A 05/28/2022   Procedure: BALLOON DILATION;  Surgeon: Lanelle Bal, DO;  [facility];  Service: Endoscopy;  Laterality: N/A;   BIOPSY  05/28/2022   Procedure: BIOPSY;  Surgeon: Lanelle Bal, DO;  [facility];  Service: Endoscopy;;   COLONOSCOPY WITH PROPOFOL N/A 12/25/2021   Procedure: COLONOSCOPY WITH PROPOFOL;  Surgeon: Lanelle Bal, DO;  [facility];  Service: Endoscopy;  Laterality: N/A;  2:30pm   ESOPHAGOGASTRODUODENOSCOPY (EGD) WITH PROPOFOL N/A 05/28/2022   Procedure: ESOPHAGOGASTRODUODENOSCOPY (EGD) WITH PROPOFOL;  Surgeon: Lanelle Bal, DO;  [facility];  Service: Endoscopy;  Laterality: N/A;  10:00am, asa 3   LAPAROSCOPIC APPENDECTOMY N/A 05/20/2018   Procedure: APPENDECTOMY LAPAROSCOPIC;  Surgeon: Franky Macho, MD;  Location: [facility];  Service: General;  Laterality: N/A;   POLYPECTOMY  12/25/2021   Procedure: POLYPECTOMY;  Surgeon: Lanelle Bal, DO;  [facility]: [facility];  Service: Endoscopy;;   PORTACATH PLACEMENT Right 04/14/2018   Procedure: INSERTION PORT-A-CATH;  Surgeon: Franky Macho, MD;  Location: [facility];  Service: General;  Laterality: Right;    Social History: Social History   Socioeconomic History   Marital status: Divorced    Spouse name: Not on file   Number of children: 6   Years of education: Not on file   Highest education level: Not on file  Occupational History    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  Tobacco Use   Smoking status: Never   Smokeless tobacco: Never  Vaping Use   Vaping Use: Never used  Substance and Sexual Activity   Alcohol use: Never   Drug use: Not Currently   Sexual activity: Not Currently  Other Topics Concern   Not on file  Social History Narrative   Not on file   Social Determinants of Health   Financial Resource Strain: Low Risk  (07/06/2020)   Overall Financial Resource Strain (CARDIA)    Difficulty of Paying Living Expenses: Not hard at all  Food Insecurity: No Food Insecurity (09/17/2022)   Hunger Vital Sign    Worried About Running Out of Food in the Last Year: Never true    Ran Out of Food in the Last Year: Never true  Transportation Needs: No Transportation Needs (09/17/2022)   PRAPARE - Administrator, Civil Service (Medical): No    Lack of Transportation (Non-Medical): No  Physical Activity: Inactive (07/06/2020)   Exercise  Vital Sign    Days of Exercise per Week: 0 days    Minutes of Exercise per Session: 0 min  Stress: No Stress Concern Present (07/06/2020)   [employer] of Occupational Health - Occupational Stress Questionnaire    Feeling of Stress : Not at all  Social Connections: Moderately Isolated (07/06/2020)   Social Connection and Isolation Panel [NHANES]    Frequency of Communication with Friends and Family: More than three times a week    Frequency  of Social Gatherings with Friends and Family: Twice a week    Attends Religious Services: 1 to 4 times per year    Active Member of [employer] or Organizations: No    Attends Banker Meetings: Never    Marital Status: Separated  Intimate Partner Violence: Not At Risk (09/17/2022)   Humiliation, Afraid, Rape, and Kick questionnaire    Fear of Current or Ex-Partner: No    Emotionally Abused: No    Physically Abused: No    Sexually Abused: No    Family History: Family History  Problem Relation Age of Onset   Heart disease Mother    Emphysema Father    Diabetes Sister    Depression Sister    Cancer Brother        liver, lung, and colon. colon cancer at age 25.   Diabetes Brother     Current Medications:  Current Outpatient Medications:    acetaminophen (TYLENOL) 325 MG tablet, Take 650 mg by mouth as needed for moderate pain (prior to infusion)., Disp: , Rfl:    acyclovir (ZOVIRAX) 400 MG tablet, Take 1 tablet (400 mg total) by mouth 2 (two) times daily., Disp: 60 tablet, Rfl: 6   albuterol (PROVENTIL) (2.5 MG/3ML) 0.083% nebulizer solution, Take 3 mLs (2.5 mg total) by nebulization every 4 (four) hours as needed for wheezing or shortness of breath., Disp: 75 mL, Rfl: 2   albuterol (VENTOLIN HFA) 108 (90 Base) MCG/ACT inhaler, Inhale 2 puffs into the lungs every 4 (four) hours as needed for wheezing or shortness of breath., Disp: 18 g, Rfl: 2   ALPRAZolam (XANAX) 0.25 MG tablet, Take one tablet (0.25 mg) by mouth in the morning as  needed, and two tablets (0.5 mg) at bedtime as needed, Disp: 90 tablet, Rfl: 2   Calcium Carb-Cholecalciferol (CALCIUM 1000 + D PO), Take 1,000 mg by mouth daily., Disp: , Rfl:    chlorthalidone (HYGROTON) 25 MG tablet, Take 25 mg by mouth every morning., Disp: , Rfl:    dexAMETHasone 20 MG TABS, Take 1 tablet by mouth as directed. Take 1 tablet 30 minutes prior to infusions, Disp: 30 tablet, Rfl: 2   dextromethorphan-guaiFENesin (MUCINEX DM) 30-600 MG 12hr tablet, Take 1 tablet by mouth 2 (two) times daily., Disp: 20 tablet, Rfl: 0   diphenhydrAMINE (BENADRYL) 25 MG tablet, Take 50 mg by mouth as needed (prior to infusion)., Disp: , Rfl:    ELIQUIS 2.5 MG TABS tablet, Take 2.5 mg by mouth 2 (two) times daily., Disp: , Rfl:    escitalopram (LEXAPRO) 20 MG tablet, Take 20 mg by mouth daily., Disp: , Rfl:    gabapentin (NEURONTIN) 100 MG capsule, Take by mouth. 100mg  every morning and 200 mg every evening, Disp: , Rfl:    glipiZIDE (GLUCOTROL) 5 MG tablet, Take 1 tablet (5 mg total) by mouth daily before breakfast., Disp: 30 tablet, Rfl: 5   HYDROcodone-acetaminophen (NORCO) 10-325 MG tablet, Take 1 tablet by mouth every 6 (six) hours as needed., Disp: 120 tablet, Rfl: 0   magnesium oxide (MAG-OX) 400 (240 Mg) MG tablet, Take 1 tablet (400 mg total) by mouth 2 (two) times daily., Disp: 60 tablet, Rfl: 4   methocarbamol (ROBAXIN) 500 MG tablet, Take 1 tablet (500 mg total) by mouth every 8 (eight) hours as needed for muscle spasms (back pain)., Disp: 20 tablet, Rfl: 0   midodrine (PROAMATINE) 2.5 MG tablet, Take 1 tablet (2.5 mg total) by mouth 3 (three) times daily with meals., Disp:  90 tablet, Rfl: 1   montelukast (SINGULAIR) 10 MG tablet, TAKE 1 TABLET BY MOUTH AS DIRECTED 30 MINUTES PRIOR TO TREATMENT (Patient taking differently: Take 10 mg by mouth See admin instructions. Take 1 tablet by mouth as directed 30 minutes prior to treatment), Disp: 90 tablet, Rfl: 0   Multiple Vitamin (MULTI VITAMIN)  TABS, Take 1 tablet by mouth daily., Disp: , Rfl:    Nebulizers (COMPRESSOR/NEBULIZER) MISC, 1 Units by Does not apply route 3 (three) times daily as needed., Disp: 1 each, Rfl: 0   pantoprazole (PROTONIX) 40 MG tablet, Take 1 tablet (40 mg total) by mouth daily., Disp: 90 tablet, Rfl: 6   polyethylene glycol (MIRALAX / GLYCOLAX) 17 g packet, Take 17 g by mouth daily as needed for moderate constipation., Disp: 14 each, Rfl: 0   prochlorperazine (COMPAZINE) 10 MG tablet, Take 1 tablet (10 mg total) by mouth every 6 (six) hours as needed for nausea or vomiting., Disp: 30 tablet, Rfl: 0   rosuvastatin (CRESTOR) 10 MG tablet, Take 10 mg by mouth daily., Disp: , Rfl:    zolpidem (AMBIEN) 10 MG tablet, Take 1 tablet (10 mg total) by mouth at bedtime as needed for sleep., Disp: 30 tablet, Rfl: 5   Allergies: Allergies  Allergen Reactions   Ciprofloxacin Anaphylaxis   Amoxicillin Other (See Comments)    unknown   Morphine And Related Nausea And Vomiting   Augmentin [Amoxicillin-Pot Clavulanate] Other (See Comments)    Headache, insomnia    REVIEW OF SYSTEMS:   Review of Systems  Constitutional:  Negative for chills, fatigue and fever.  HENT:   Negative for lump/mass, mouth sores, nosebleeds, sore throat and trouble swallowing.   Eyes:  Negative for eye problems.  Respiratory:  Negative for cough and shortness of breath.   Cardiovascular:  Negative for chest pain, leg swelling and palpitations.  Gastrointestinal:  Negative for abdominal pain, constipation, diarrhea, nausea and vomiting.  Genitourinary:  Negative for bladder incontinence, difficulty urinating, dysuria, frequency, hematuria and nocturia.   Musculoskeletal:  Negative for arthralgias, back pain, flank pain, myalgias and neck pain.  Skin:  Negative for itching and rash.  Neurological:  Positive for numbness. Negative for dizziness and headaches.  Hematological:  Does not bruise/bleed easily.  Psychiatric/Behavioral:  Positive for  depression and sleep disturbance. Negative for suicidal ideas. The patient is nervous/anxious.   All other systems reviewed and are negative.    VITALS:   There were no vitals taken for this visit.  Wt Readings from Last 3 Encounters:  11/19/22 169 lb 12.8 oz (77 kg)  11/12/22 171 lb 12.8 oz (77.9 kg)  11/05/22 170 lb 9.6 oz (77.4 kg)    There is no height or weight on file to calculate BMI.  Performance status (ECOG): 1 - Symptomatic but completely ambulatory  PHYSICAL EXAM:   Physical Exam Vitals and nursing note reviewed. Exam conducted with a chaperone present.  Constitutional:      Appearance: Normal appearance.  Cardiovascular:     Rate and Rhythm: Normal rate and regular rhythm.     Pulses: Normal pulses.     Heart sounds: Normal heart sounds.  Pulmonary:     Effort: Pulmonary effort is normal.     Breath sounds: Normal breath sounds.  Abdominal:     Palpations: Abdomen is soft. There is no hepatomegaly, splenomegaly or mass.     Tenderness: There is no abdominal tenderness.  Musculoskeletal:     Right lower leg: No edema.  Left lower leg: No edema.  Lymphadenopathy:     Cervical: No cervical adenopathy.     Right cervical: No superficial, deep or posterior cervical adenopathy.    Left cervical: No superficial, deep or posterior cervical adenopathy.     Upper Body:     Right upper body: No supraclavicular or axillary adenopathy.     Left upper body: No supraclavicular or axillary adenopathy.  Neurological:     General: No focal deficit present.     Mental Status: She is alert and oriented to person, place, and time.  Psychiatric:        Mood and Affect: Mood normal.        Behavior: Behavior normal.     LABS:      Latest Ref Rng & Units 11/19/2022    8:08 AM 11/12/2022    9:18 AM 11/05/2022    7:48 AM  CBC  WBC 4.0 - 10.5 K/uL 4.9  3.4  2.8   Hemoglobin 12.0 - 15.0 g/dL 16.1  9.6  9.9   Hematocrit 36.0 - 46.0 % 32.5  30.2  31.0   Platelets 150 - 400  K/uL 134  108  134       Latest Ref Rng & Units 11/19/2022    8:08 AM 11/05/2022    7:48 AM 10/29/2022    8:47 AM  CMP  Glucose 70 - 99 mg/dL 096  045  409   BUN 8 - 23 mg/dL 22  26  23    Creatinine 0.44 - 1.00 mg/dL 8.11  9.14  7.82   Sodium 135 - 145 mmol/L 140  139  138   Potassium 3.5 - 5.1 mmol/L 4.0  4.0  3.9   Chloride 98 - 111 mmol/L 106  104  103   CO2 22 - 32 mmol/L 23  26  26    Calcium 8.9 - 10.3 mg/dL 9.0  9.2  8.7   Total Protein 6.5 - 8.1 g/dL 6.6  6.6  6.3   Total Bilirubin 0.3 - 1.2 mg/dL 0.7  0.7  0.8   Alkaline Phos 38 - 126 U/L 66  54  56   AST 15 - 41 U/L 15  19  19    ALT 0 - 44 U/L 16  20  17       No results found for: "CEA1", "CEA" / No results found for: "CEA1", "CEA" No results found for: "PSA1" No results found for: "CAN199" No results found for: "CAN125"  Lab Results  Component Value Date   TOTALPROTELP 6.4 10/08/2022   ALBUMINELP 3.6 10/08/2022   A1GS 0.3 10/08/2022   A2GS 1.1 (H) 10/08/2022   BETS 0.8 10/08/2022   GAMS 0.7 10/08/2022   MSPIKE 0.3 (H) 10/08/2022   SPEI Comment 10/08/2022   Lab Results  Component Value Date   TIBC 290 09/17/2022   TIBC 278 07/30/2022   TIBC 272 05/24/2022   FERRITIN 1,059 (H) 09/17/2022   FERRITIN 313 (H) 07/30/2022   FERRITIN 435 (H) 05/24/2022   IRONPCTSAT 10 (L) 09/17/2022   IRONPCTSAT 21 07/30/2022   IRONPCTSAT 27 05/24/2022   Lab Results  Component Value Date   LDH 129 05/24/2022   LDH 135 03/20/2022   LDH 129 12/14/2021   Component Ref Range & Units 11/15/22  Total Protein 6.4 - 8.9 g/dL 6.0 Low   Electrophoresis, Albumin 3.5 - 5.0 g/dL 3.9  NFAOZ-3-YQMVHQIO 0.1 - 0.4 g/dL 0.3  NGEXB-2-WUXLKGMW 0.4 - 1.2 g/dL 0.8  NUUV-2-ZDGUYQIH 0.4 - 0.7  g/dL 0.4  ZOXW-9-UEAVWUJW 0.1 - 0.4 g/dL 0.3  Gamma Globulin 0.5 - 1.6 g/dL 0.4 Low   Protein Electrophoresis Interpretation MINUTE M SPIKE IN THE GAMMA REGION; TOO SMALL TO QUANTITATE (<0.2 G/DL).   Component Ref Range & Units 11/15/22  Free  Kappa Light Chains, Serum 3.30 - 19.40 mg/L 3.86  Free Lambda Light Chains, Serum 5.71 - 26.30 mg/L 10.45  Kappa/Lambda Ratio, Serum 0.26 - 1.65 0.37   Component Ref Range & Units 11/15/22  Immunoglobulin A, Quantitative (IgA) 66 - 433 mg/dL 20 Low   Immunoglobulin G, Quantitative (IgG) 635 - 1741 mg/dL 119 Low   Immunoglobulin M, Quantitative (IgM) 45 - 281 mg/dL <14 Low     STUDIES:   No results found.

## 2022-11-19 ENCOUNTER — Inpatient Hospital Stay (HOSPITAL_BASED_OUTPATIENT_CLINIC_OR_DEPARTMENT_OTHER): Payer: 59 | Admitting: Hematology

## 2022-11-19 ENCOUNTER — Inpatient Hospital Stay: Payer: 59 | Attending: Hematology

## 2022-11-19 ENCOUNTER — Inpatient Hospital Stay: Payer: 59

## 2022-11-19 DIAGNOSIS — G629 Polyneuropathy, unspecified: Secondary | ICD-10-CM | POA: Diagnosis not present

## 2022-11-19 DIAGNOSIS — I2699 Other pulmonary embolism without acute cor pulmonale: Secondary | ICD-10-CM | POA: Insufficient documentation

## 2022-11-19 DIAGNOSIS — Z5112 Encounter for antineoplastic immunotherapy: Secondary | ICD-10-CM | POA: Insufficient documentation

## 2022-11-19 DIAGNOSIS — Z7901 Long term (current) use of anticoagulants: Secondary | ICD-10-CM | POA: Insufficient documentation

## 2022-11-19 DIAGNOSIS — F419 Anxiety disorder, unspecified: Secondary | ICD-10-CM | POA: Diagnosis not present

## 2022-11-19 DIAGNOSIS — C9000 Multiple myeloma not having achieved remission: Secondary | ICD-10-CM | POA: Insufficient documentation

## 2022-11-19 LAB — COMPREHENSIVE METABOLIC PANEL
ALT: 16 U/L (ref 0–44)
AST: 15 U/L (ref 15–41)
Albumin: 3.8 g/dL (ref 3.5–5.0)
Alkaline Phosphatase: 66 U/L (ref 38–126)
Anion gap: 11 (ref 5–15)
BUN: 22 mg/dL (ref 8–23)
CO2: 23 mmol/L (ref 22–32)
Calcium: 9 mg/dL (ref 8.9–10.3)
Chloride: 106 mmol/L (ref 98–111)
Creatinine, Ser: 1.36 mg/dL — ABNORMAL HIGH (ref 0.44–1.00)
GFR, Estimated: 41 mL/min — ABNORMAL LOW (ref >60)
Glucose, Bld: 145 mg/dL — ABNORMAL HIGH (ref 70–99)
Potassium: 4 mmol/L (ref 3.5–5.1)
Sodium: 140 mmol/L (ref 135–145)
Total Bilirubin: 0.7 mg/dL (ref 0.3–1.2)
Total Protein: 6.6 g/dL (ref 6.5–8.1)

## 2022-11-19 LAB — CBC WITH DIFFERENTIAL/PLATELET
Abs Immature Granulocytes: 0.01 10*3/uL (ref 0.00–0.07)
Basophils Absolute: 0 10*3/uL (ref 0.0–0.1)
Basophils Relative: 0 %
Eosinophils Absolute: 0.1 10*3/uL (ref 0.0–0.5)
Eosinophils Relative: 2 %
HCT: 32.5 % — ABNORMAL LOW (ref 36.0–46.0)
Hemoglobin: 10.2 g/dL — ABNORMAL LOW (ref 12.0–15.0)
Immature Granulocytes: 0 %
Lymphocytes Relative: 7 %
Lymphs Abs: 0.4 10*3/uL — ABNORMAL LOW (ref 0.7–4.0)
MCH: 33.3 pg (ref 26.0–34.0)
MCHC: 31.4 g/dL (ref 30.0–36.0)
MCV: 106.2 fL — ABNORMAL HIGH (ref 80.0–100.0)
Monocytes Absolute: 0.4 10*3/uL (ref 0.1–1.0)
Monocytes Relative: 8 %
Neutro Abs: 4 10*3/uL (ref 1.7–7.7)
Neutrophils Relative %: 83 %
Platelets: 134 10*3/uL — ABNORMAL LOW (ref 150–400)
RBC: 3.06 MIL/uL — ABNORMAL LOW (ref 3.87–5.11)
RDW: 12.6 % (ref 11.5–15.5)
WBC: 4.9 10*3/uL (ref 4.0–10.5)
nRBC: 0 % (ref 0.0–0.2)

## 2022-11-19 LAB — MAGNESIUM: Magnesium: 2 mg/dL (ref 1.7–2.4)

## 2022-11-19 MED ORDER — HEPARIN SOD (PORK) LOCK FLUSH 100 UNIT/ML IV SOLN
500.0000 [IU] | Freq: Once | INTRAVENOUS | Status: AC
  Administered 2022-11-19: 500 [IU] via INTRAVENOUS

## 2022-11-19 MED ORDER — SODIUM CHLORIDE 0.9% FLUSH
10.0000 mL | INTRAVENOUS | Status: DC | PRN
  Administered 2022-11-19: 10 mL via INTRAVENOUS

## 2022-11-19 MED ORDER — BORTEZOMIB CHEMO SQ INJECTION 3.5 MG (2.5MG/ML)
1.0000 mg/m2 | Freq: Once | INTRAMUSCULAR | Status: AC
  Administered 2022-11-19: 2 mg via SUBCUTANEOUS
  Filled 2022-11-19: qty 0.8

## 2022-11-19 MED ORDER — SODIUM CHLORIDE 0.9% FLUSH
10.0000 mL | Freq: Once | INTRAVENOUS | Status: AC
  Administered 2022-11-19: 10 mL via INTRAVENOUS

## 2022-11-19 NOTE — Patient Instructions (Signed)
[facility] at [facility] Discharge Instructions   You were seen and examined today by Dr. Katragadda.  He reviewed the results of your lab work which are normal/stable.   We will proceed with your treatment today.  Return as scheduled.    Thank you for choosing [facility] at [facility] to provide your oncology and hematology care.  To afford each patient quality time with our provider, please arrive at least 15 minutes before your scheduled appointment time.   If you have a lab appointment with the Cancer Center please come in thru the Main Entrance and check in at the main information desk.  You need to re-schedule your appointment should you arrive 10 or more minutes late.  We strive to give you quality time with our providers, and arriving late affects you and other patients whose appointments are after yours.  Also, if you no show three or more times for appointments you may be dismissed from the clinic at the providers discretion.     Again, thank you for choosing [facility].  Our hope is that these requests will decrease the amount of time that you wait before being seen by our physicians.       _____________________________________________________________  Should you have questions after your visit to [facility], please contact our office at [phone] and follow the prompts.  Our office hours are 8:00 a.m. and 4:30 p.m. Monday - Friday.  Please note that voicemails left after 4:00 p.m. may not be returned until the following business day.  We are closed weekends and major holidays.  You do have access to a nurse 24-7, just call the main number to the clinic [phone] and do not press any options, hold on the line and a nurse will answer the phone.    For prescription refill requests, have your pharmacy contact our office and allow 72 hours.    Due to Covid, you will need to wear a mask upon entering  the hospital. If you do not have a mask, a mask will be given to you at the Main Entrance upon arrival. For doctor visits, patients may have 1 support person age 18 or older with them. For treatment visits, patients can not have anyone with them due to social distancing guidelines and our immunocompromised population.      

## 2022-11-19 NOTE — Progress Notes (Signed)
Patient presents today for Velcade infusion. Patient is in satisfactory condition with no new complaints voiced.  Vital signs are stable.  Labs reviewed and all labs are within treatment parameters.  We will proceed with treatment per MD orders.    Pt took pre-meds at home prior to arrival.  Velcade given today per MD orders. Tolerated infusion without adverse affects. Vital signs stable. No complaints at this time. Discharged from clinic ambulatory in stable condition. Alert and oriented x 3. F/U with [facility] as scheduled.

## 2022-11-19 NOTE — Progress Notes (Signed)
Patient has been examined by Dr. Katragadda. Vital signs and labs have been reviewed by MD - ANC, Creatinine, LFTs, hemoglobin, and platelets are within treatment parameters per M.D. - pt may proceed with treatment.  Primary RN and pharmacy notified.  

## 2022-11-19 NOTE — Progress Notes (Signed)
Patients port flushed without difficulty.  Good blood return noted with no bruising or swelling noted at site.  Stable during access and blood draw.  Patient remains accessed for possible fluids.

## 2022-11-19 NOTE — Patient Instructions (Signed)
[facility]  Discharge Instructions: Thank you for choosing [facility] to provide your oncology and hematology care.  If you have a lab appointment with the Cancer Center, please come in thru the Main Entrance and check in at the main information desk.  Wear comfortable clothing and clothing appropriate for easy access to any Portacath or PICC line.   We strive to give you quality time with your provider. You may need to reschedule your appointment if you arrive late (15 or more minutes).  Arriving late affects you and other patients whose appointments are after yours.  Also, if you miss three or more appointments without notifying the office, you may be dismissed from the clinic at the provider's discretion.      For prescription refill requests, have your pharmacy contact our office and allow 72 hours for refills to be completed.    Today you received the following chemotherapy and/or immunotherapy agents Velcade   To help prevent nausea and vomiting after your treatment, we encourage you to take your nausea medication as directed.  Bortezomib Injection What is this medication? BORTEZOMIB (bor TEZ oh mib) treats lymphoma. It may also be used to treat multiple myeloma, a type of bone marrow cancer. It works by blocking a protein that causes cancer cells to grow and multiply. This helps to slow or stop the spread of cancer cells. This medicine may be used for other purposes; ask your health care provider or pharmacist if you have questions. COMMON BRAND NAME(S): Velcade What should I tell my care team before I take this medication? They need to know if you have any of these conditions: Dehydration Diabetes Heart disease Liver disease Tingling of the fingers or toes or other nerve disorder An unusual or allergic reaction to bortezomib, other medications, foods, dyes, or preservatives If you or your partner are pregnant or trying to get  pregnant Breastfeeding How should I use this medication? This medication is injected into a vein or under the skin. It is given by your care team in a hospital or clinic setting. Talk to your care team about the use of this medication in children. Special care may be needed. Overdosage: If you think you have taken too much of this medicine contact a poison control center or emergency room at once. NOTE: This medicine is only for you. Do not share this medicine with others. What if I miss a dose? Keep appointments for follow-up doses. It is important not to miss your dose. Call your care team if you are unable to keep an appointment. What may interact with this medication? Ketoconazole Rifampin This list may not describe all possible interactions. Give your health care provider a list of all the medicines, herbs, non-prescription drugs, or dietary supplements you use. Also tell them if you smoke, drink alcohol, or use illegal drugs. Some items may interact with your medicine. What should I watch for while using this medication? Your condition will be monitored carefully while you are receiving this medication. You may need blood work while taking this medication. This medication may affect your coordination, reaction time, or judgment. Do not drive or operate machinery until you know how this medication affects you. Sit up or stand slowly to reduce the risk of dizzy or fainting spells. Drinking alcohol with this medication can increase the risk of these side effects. This medication may increase your risk of getting an infection. Call your care team for advice if you get a   fever, chills, sore throat, or other symptoms of a cold or flu. Do not treat yourself. Try to avoid being around people who are sick. Check with your care team if you have severe diarrhea, nausea, and vomiting, or if you sweat a lot. The loss of too much body fluid may make it dangerous for you to take this medication. Talk to  your care team if you may be pregnant. Serious birth defects can occur if you take this medication during pregnancy and for 7 months after the last dose. You will need a negative pregnancy test before starting this medication. Contraception is recommended while taking this medication and for 7 months after the last dose. Your care team can help you find the option that works for you. If your partner can get pregnant, use a condom during sex while taking this medication and for 4 months after the last dose. Do not breastfeed while taking this medication and for 2 months after the last dose. This medication may cause infertility. Talk to your care team if you are concerned about your fertility. What side effects may I notice from receiving this medication? Side effects that you should report to your care team as soon as possible: Allergic reactions--skin rash, itching, hives, swelling of the face, lips, tongue, or throat Bleeding--bloody or black, tar-like stools, vomiting blood or brown material that looks like coffee grounds, red or dark brown urine, small red or purple spots on skin, unusual bruising or bleeding Bleeding in the brain--severe headache, stiff neck, confusion, dizziness, change in vision, numbness or weakness of the face, arm, or leg, trouble speaking, trouble walking, vomiting Bowel blockage--stomach cramping, unable to have a bowel movement or pass gas, loss of appetite, vomiting Heart failure--shortness of breath, swelling of the ankles, feet, or hands, sudden weight gain, unusual weakness or fatigue Infection--fever, chills, cough, sore throat, wounds that don't heal, pain or trouble when passing urine, general feeling of discomfort or being unwell Liver injury--right upper belly pain, loss of appetite, nausea, light-colored stool, dark yellow or brown urine, yellowing skin or eyes, unusual weakness or fatigue Low blood pressure--dizziness, feeling faint or lightheaded, blurry  vision Lung injury--shortness of breath or trouble breathing, cough, spitting up blood, chest pain, fever Pain, tingling, or numbness in the hands or feet Severe or prolonged diarrhea Stomach pain, bloody diarrhea, pale skin, unusual weakness or fatigue, decrease in the amount of urine, which may be signs of hemolytic uremic syndrome Sudden and severe headache, confusion, change in vision, seizures, which may be signs of posterior reversible encephalopathy syndrome (PRES) TTP--purple spots on the skin or inside the mouth, pale skin, yellowing skin or eyes, unusual weakness or fatigue, fever, fast or irregular heartbeat, confusion, change in vision, trouble speaking, trouble walking Tumor lysis syndrome (TLS)--nausea, vomiting, diarrhea, decrease in the amount of urine, dark urine, unusual weakness or fatigue, confusion, muscle pain or cramps, fast or irregular heartbeat, joint pain Side effects that usually do not require medical attention (report to your care team if they continue or are bothersome): Constipation Diarrhea Fatigue Loss of appetite Nausea This list may not describe all possible side effects. Call your doctor for medical advice about side effects. You may report side effects to FDA at 1-800-FDA-1088. Where should I keep my medication? This medication is given in a hospital or clinic. It will not be stored at home. NOTE: This sheet is a summary. It may not cover all possible information. If you have questions about this medicine, talk to your   doctor, pharmacist, or health care provider.  2023 Elsevier/Gold Standard (2022-01-03 00:00:00)   BELOW ARE SYMPTOMS THAT SHOULD BE REPORTED IMMEDIATELY: *FEVER GREATER THAN 100.4 F (38 C) OR HIGHER *CHILLS OR SWEATING *NAUSEA AND VOMITING THAT IS NOT CONTROLLED WITH YOUR NAUSEA MEDICATION *UNUSUAL SHORTNESS OF BREATH *UNUSUAL BRUISING OR BLEEDING *URINARY PROBLEMS (pain or burning when urinating, or frequent urination) *BOWEL  PROBLEMS (unusual diarrhea, constipation, pain near the anus) TENDERNESS IN MOUTH AND THROAT WITH OR WITHOUT PRESENCE OF ULCERS (sore throat, sores in mouth, or a toothache) UNUSUAL RASH, SWELLING OR PAIN  UNUSUAL VAGINAL DISCHARGE OR ITCHING   Items with * indicate a potential emergency and should be followed up as soon as possible or go to the Emergency Department if any problems should occur.  Please show the CHEMOTHERAPY ALERT CARD or IMMUNOTHERAPY ALERT CARD at check-in to the Emergency Department and triage nurse.  Should you have questions after your visit or need to cancel or reschedule your appointment, please contact [facility] [phone]  and follow the prompts.  Office hours are 8:00 a.m. to 4:30 p.m. Monday - Friday. Please note that voicemails left after 4:00 p.m. may not be returned until the following business day.  We are closed weekends and major holidays. You have access to a nurse at all times for urgent questions. Please call the main number to the clinic [phone] and follow the prompts.  For any non-urgent questions, you may also contact your provider using MyChart. We now offer e-Visits for anyone 18 and older to request care online for non-urgent symptoms. For details visit [email or web address].   Also download the MyChart app! Go to the app store, search "MyChart", open the app, select [facility], and log in with your MyChart username and password.   

## 2022-11-20 ENCOUNTER — Other Ambulatory Visit: Payer: Self-pay

## 2022-11-20 ENCOUNTER — Other Ambulatory Visit: Payer: Self-pay | Admitting: Hematology

## 2022-11-20 LAB — KAPPA/LAMBDA LIGHT CHAINS
Kappa free light chain: 4.3 mg/L (ref 3.3–19.4)
Kappa, lambda light chain ratio: 0.36 (ref 0.26–1.65)
Lambda free light chains: 11.8 mg/L (ref 5.7–26.3)

## 2022-11-21 LAB — PROTEIN ELECTROPHORESIS, SERUM
A/G Ratio: 1.5 (ref 0.7–1.7)
Albumin ELP: 3.6 g/dL (ref 2.9–4.4)
Alpha-1-Globulin: 0.3 g/dL (ref 0.0–0.4)
Alpha-2-Globulin: 0.9 g/dL (ref 0.4–1.0)
Beta Globulin: 0.8 g/dL (ref 0.7–1.3)
Gamma Globulin: 0.4 g/dL (ref 0.4–1.8)
Globulin, Total: 2.4 g/dL (ref 2.2–3.9)
M-Spike, %: 0.1 g/dL — ABNORMAL HIGH
Total Protein ELP: 6 g/dL (ref 6.0–8.5)

## 2022-11-22 ENCOUNTER — Inpatient Hospital Stay: Payer: 59

## 2022-11-22 ENCOUNTER — Other Ambulatory Visit: Payer: Self-pay

## 2022-11-22 VITALS — BP 139/81 | HR 89 | Temp 97.5°F | Resp 18

## 2022-11-22 DIAGNOSIS — C9000 Multiple myeloma not having achieved remission: Secondary | ICD-10-CM

## 2022-11-22 DIAGNOSIS — Z5112 Encounter for antineoplastic immunotherapy: Secondary | ICD-10-CM | POA: Diagnosis not present

## 2022-11-22 MED ORDER — HYDROCODONE-ACETAMINOPHEN 10-325 MG PO TABS
1.0000 | ORAL_TABLET | Freq: Four times a day (QID) | ORAL | 0 refills | Status: DC | PRN
Start: 2022-11-22 — End: 2022-12-20

## 2022-11-22 MED ORDER — BORTEZOMIB CHEMO SQ INJECTION 3.5 MG (2.5MG/ML)
1.0000 mg/m2 | Freq: Once | INTRAMUSCULAR | Status: AC
  Administered 2022-11-22: 2 mg via SUBCUTANEOUS
  Filled 2022-11-22: qty 0.8

## 2022-11-22 NOTE — Progress Notes (Signed)
Patient presents today for Velcade injection. Labs reviewed fro 11-19-2022 within parameters for treatment. Vital signs within parameter for treatment. Patient has no complaints of any changes since her last treatment.   Velcade injection given today per MD orders. Tolerated infusion without adverse affects. Vital signs stable. No complaints at this time. Discharged from clinic ambulatory in stable condition. Alert and oriented x 3. F/U with [facility] as scheduled.

## 2022-11-22 NOTE — Patient Instructions (Signed)
[facility]  Discharge Instructions: Thank you for choosing [facility] to provide your oncology and hematology care.  If you have a lab appointment with the [facility] - please note that after April 8th, 2024, all labs will be drawn in the cancer center.  You do not have to check in or register with the main entrance as you have in the past but will complete your check-in in the cancer center.  Wear comfortable clothing and clothing appropriate for easy access to any Portacath or PICC line.   We strive to give you quality time with your provider. You may need to reschedule your appointment if you arrive late (15 or more minutes).  Arriving late affects you and other patients whose appointments are after yours.  Also, if you miss three or more appointments without notifying the office, you may be dismissed from the clinic at the provider's discretion.      For prescription refill requests, have your pharmacy contact our office and allow 72 hours for refills to be completed.    Today you received the following chemotherapy and/or immunotherapy agents Velcade. Bortezomib Injection What is this medication? BORTEZOMIB (bor TEZ oh mib) treats lymphoma. It may also be used to treat multiple myeloma, a type of bone marrow cancer. It works by blocking a protein that causes cancer cells to grow and multiply. This helps to slow or stop the spread of cancer cells. This medicine may be used for other purposes; ask your health care provider or pharmacist if you have questions. COMMON BRAND NAME(S): Velcade What should I tell my care team before I take this medication? They need to know if you have any of these conditions: Dehydration Diabetes Heart disease Liver disease Tingling of the fingers or toes or other nerve disorder An unusual or allergic reaction to bortezomib, other medications, foods, dyes, or preservatives If you or your partner are pregnant or trying to  get pregnant Breastfeeding How should I use this medication? This medication is injected into a vein or under the skin. It is given by your care team in a hospital or clinic setting. Talk to your care team about the use of this medication in children. Special care may be needed. Overdosage: If you think you have taken too much of this medicine contact a poison control center or emergency room at once. NOTE: This medicine is only for you. Do not share this medicine with others. What if I miss a dose? Keep appointments for follow-up doses. It is important not to miss your dose. Call your care team if you are unable to keep an appointment. What may interact with this medication? Ketoconazole Rifampin This list may not describe all possible interactions. Give your health care provider a list of all the medicines, herbs, non-prescription drugs, or dietary supplements you use. Also tell them if you smoke, drink alcohol, or use illegal drugs. Some items may interact with your medicine. What should I watch for while using this medication? Your condition will be monitored carefully while you are receiving this medication. You may need blood work while taking this medication. This medication may affect your coordination, reaction time, or judgment. Do not drive or operate machinery until you know how this medication affects you. Sit up or stand slowly to reduce the risk of dizzy or fainting spells. Drinking alcohol with this medication can increase the risk of these side effects. This medication may increase your risk of getting an infection. Call  your care team for advice if you get a fever, chills, sore throat, or other symptoms of a cold or flu. Do not treat yourself. Try to avoid being around people who are sick. Check with your care team if you have severe diarrhea, nausea, and vomiting, or if you sweat a lot. The loss of too much body fluid may make it dangerous for you to take this medication. Talk to  your care team if you may be pregnant. Serious birth defects can occur if you take this medication during pregnancy and for 7 months after the last dose. You will need a negative pregnancy test before starting this medication. Contraception is recommended while taking this medication and for 7 months after the last dose. Your care team can help you find the option that works for you. If your partner can get pregnant, use a condom during sex while taking this medication and for 4 months after the last dose. Do not breastfeed while taking this medication and for 2 months after the last dose. This medication may cause infertility. Talk to your care team if you are concerned about your fertility. What side effects may I notice from receiving this medication? Side effects that you should report to your care team as soon as possible: Allergic reactions--skin rash, itching, hives, swelling of the face, lips, tongue, or throat Bleeding--bloody or black, tar-like stools, vomiting blood or brown material that looks like coffee grounds, red or dark brown urine, small red or purple spots on skin, unusual bruising or bleeding Bleeding in the brain--severe headache, stiff neck, confusion, dizziness, change in vision, numbness or weakness of the face, arm, or leg, trouble speaking, trouble walking, vomiting Bowel blockage--stomach cramping, unable to have a bowel movement or pass gas, loss of appetite, vomiting Heart failure--shortness of breath, swelling of the ankles, feet, or hands, sudden weight gain, unusual weakness or fatigue Infection--fever, chills, cough, sore throat, wounds that don't heal, pain or trouble when passing urine, general feeling of discomfort or being unwell Liver injury--right upper belly pain, loss of appetite, nausea, light-colored stool, dark yellow or brown urine, yellowing skin or eyes, unusual weakness or fatigue Low blood pressure--dizziness, feeling faint or lightheaded, blurry  vision Lung injury--shortness of breath or trouble breathing, cough, spitting up blood, chest pain, fever Pain, tingling, or numbness in the hands or feet Severe or prolonged diarrhea Stomach pain, bloody diarrhea, pale skin, unusual weakness or fatigue, decrease in the amount of urine, which may be signs of hemolytic uremic syndrome Sudden and severe headache, confusion, change in vision, seizures, which may be signs of posterior reversible encephalopathy syndrome (PRES) TTP--purple spots on the skin or inside the mouth, pale skin, yellowing skin or eyes, unusual weakness or fatigue, fever, fast or irregular heartbeat, confusion, change in vision, trouble speaking, trouble walking Tumor lysis syndrome (TLS)--nausea, vomiting, diarrhea, decrease in the amount of urine, dark urine, unusual weakness or fatigue, confusion, muscle pain or cramps, fast or irregular heartbeat, joint pain Side effects that usually do not require medical attention (report to your care team if they continue or are bothersome): Constipation Diarrhea Fatigue Loss of appetite Nausea This list may not describe all possible side effects. Call your doctor for medical advice about side effects. You may report side effects to FDA at 1-800-FDA-1088. Where should I keep my medication? This medication is given in a hospital or clinic. It will not be stored at home. NOTE: This sheet is a summary. It may not cover all possible information. If  you have questions about this medicine, talk to your doctor, pharmacist, or health care provider.  2023 Elsevier/Gold Standard (2022-01-03 00:00:00)       To help prevent nausea and vomiting after your treatment, we encourage you to take your nausea medication as directed.  BELOW ARE SYMPTOMS THAT SHOULD BE REPORTED IMMEDIATELY: *FEVER GREATER THAN 100.4 F (38 C) OR HIGHER *CHILLS OR SWEATING *NAUSEA AND VOMITING THAT IS NOT CONTROLLED WITH YOUR NAUSEA MEDICATION *UNUSUAL SHORTNESS OF  BREATH *UNUSUAL BRUISING OR BLEEDING *URINARY PROBLEMS (pain or burning when urinating, or frequent urination) *BOWEL PROBLEMS (unusual diarrhea, constipation, pain near the anus) TENDERNESS IN MOUTH AND THROAT WITH OR WITHOUT PRESENCE OF ULCERS (sore throat, sores in mouth, or a toothache) UNUSUAL RASH, SWELLING OR PAIN  UNUSUAL VAGINAL DISCHARGE OR ITCHING   Items with * indicate a potential emergency and should be followed up as soon as possible or go to the Emergency Department if any problems should occur.  Please show the CHEMOTHERAPY ALERT CARD or IMMUNOTHERAPY ALERT CARD at check-in to the Emergency Department and triage nurse.  Should you have questions after your visit or need to cancel or reschedule your appointment, please contact [facility] [place] [phone]  and follow the prompts.  Office hours are 8:00 a.m. to 4:30 p.m. Monday - Friday. Please note that voicemails left after 4:00 p.m. may not be returned until the following business day.  We are closed weekends and major holidays. You have access to a nurse at all times for urgent questions. Please call the main number to the clinic [phone] and follow the prompts.  For any non-urgent questions, you may also contact your provider using MyChart. We now offer e-Visits for anyone 34 and older to request care online for non-urgent symptoms. For details visit [email or web address].   Also download the MyChart app! Go to the app store, search "MyChart", open the app, select [facility], and log in with your MyChart username and password.

## 2022-11-26 ENCOUNTER — Inpatient Hospital Stay: Payer: 59

## 2022-11-26 VITALS — BP 144/84 | HR 89 | Temp 97.9°F | Resp 18 | Wt 171.6 lb

## 2022-11-26 DIAGNOSIS — Z95828 Presence of other vascular implants and grafts: Secondary | ICD-10-CM

## 2022-11-26 DIAGNOSIS — Z5112 Encounter for antineoplastic immunotherapy: Secondary | ICD-10-CM | POA: Diagnosis not present

## 2022-11-26 DIAGNOSIS — C9000 Multiple myeloma not having achieved remission: Secondary | ICD-10-CM

## 2022-11-26 LAB — CBC WITH DIFFERENTIAL/PLATELET
Abs Immature Granulocytes: 0.01 10*3/uL (ref 0.00–0.07)
Basophils Absolute: 0 10*3/uL (ref 0.0–0.1)
Basophils Relative: 1 %
Eosinophils Absolute: 0.2 10*3/uL (ref 0.0–0.5)
Eosinophils Relative: 5 %
HCT: 32.5 % — ABNORMAL LOW (ref 36.0–46.0)
Hemoglobin: 10.3 g/dL — ABNORMAL LOW (ref 12.0–15.0)
Immature Granulocytes: 0 %
Lymphocytes Relative: 11 %
Lymphs Abs: 0.5 10*3/uL — ABNORMAL LOW (ref 0.7–4.0)
MCH: 33.7 pg (ref 26.0–34.0)
MCHC: 31.7 g/dL (ref 30.0–36.0)
MCV: 106.2 fL — ABNORMAL HIGH (ref 80.0–100.0)
Monocytes Absolute: 0.4 10*3/uL (ref 0.1–1.0)
Monocytes Relative: 10 %
Neutro Abs: 3.1 10*3/uL (ref 1.7–7.7)
Neutrophils Relative %: 73 %
Platelets: 106 10*3/uL — ABNORMAL LOW (ref 150–400)
RBC: 3.06 MIL/uL — ABNORMAL LOW (ref 3.87–5.11)
RDW: 12.3 % (ref 11.5–15.5)
WBC: 4.2 10*3/uL (ref 4.0–10.5)
nRBC: 0 % (ref 0.0–0.2)

## 2022-11-26 LAB — COMPREHENSIVE METABOLIC PANEL
ALT: 17 U/L (ref 0–44)
AST: 16 U/L (ref 15–41)
Albumin: 3.8 g/dL (ref 3.5–5.0)
Alkaline Phosphatase: 67 U/L (ref 38–126)
Anion gap: 8 (ref 5–15)
BUN: 26 mg/dL — ABNORMAL HIGH (ref 8–23)
CO2: 27 mmol/L (ref 22–32)
Calcium: 9 mg/dL (ref 8.9–10.3)
Chloride: 102 mmol/L (ref 98–111)
Creatinine, Ser: 1.31 mg/dL — ABNORMAL HIGH (ref 0.44–1.00)
GFR, Estimated: 43 mL/min — ABNORMAL LOW (ref >60)
Glucose, Bld: 201 mg/dL — ABNORMAL HIGH (ref 70–99)
Potassium: 4.2 mmol/L (ref 3.5–5.1)
Sodium: 137 mmol/L (ref 135–145)
Total Bilirubin: 0.4 mg/dL (ref 0.3–1.2)
Total Protein: 6.2 g/dL — ABNORMAL LOW (ref 6.5–8.1)

## 2022-11-26 MED ORDER — HEPARIN SOD (PORK) LOCK FLUSH 100 UNIT/ML IV SOLN
500.0000 [IU] | Freq: Once | INTRAVENOUS | Status: AC
  Administered 2022-11-26: 500 [IU] via INTRAVENOUS

## 2022-11-26 MED ORDER — DARATUMUMAB-HYALURONIDASE-FIHJ 1800-30000 MG-UT/15ML ~~LOC~~ SOLN
1800.0000 mg | Freq: Once | SUBCUTANEOUS | Status: AC
  Administered 2022-11-26: 1800 mg via SUBCUTANEOUS
  Filled 2022-11-26: qty 15

## 2022-11-26 MED ORDER — SODIUM CHLORIDE 0.9% FLUSH
10.0000 mL | INTRAVENOUS | Status: DC | PRN
  Administered 2022-11-26: 10 mL via INTRAVENOUS

## 2022-11-26 MED ORDER — BORTEZOMIB CHEMO SQ INJECTION 3.5 MG (2.5MG/ML)
1.0000 mg/m2 | Freq: Once | INTRAMUSCULAR | Status: AC
  Administered 2022-11-26: 2 mg via SUBCUTANEOUS
  Filled 2022-11-26: qty 0.8

## 2022-11-26 NOTE — Patient Instructions (Addendum)
[facility]  Discharge Instructions: Thank you for choosing [facility] to provide your oncology and hematology care.  If you have a lab appointment with the Cancer Center - please note that after April 8th, 2024, all labs will be drawn in the cancer center.  You do not have to check in or register with the main entrance as you have in the past but will complete your check-in in the cancer center.  Wear comfortable clothing and clothing appropriate for easy access to any Portacath or PICC line.   We strive to give you quality time with your provider. You may need to reschedule your appointment if you arrive late (15 or more minutes).  Arriving late affects you and other patients whose appointments are after yours.  Also, if you miss three or more appointments without notifying the office, you may be dismissed from the clinic at the provider's discretion.      For prescription refill requests, have your pharmacy contact our office and allow 72 hours for refills to be completed.    Today you received the following chemotherapy and/or immunotherapy agents Velcade/Daratumumab.   Daratumumab; Hyaluronidase Injection What is this medication? DARATUMUMAB; HYALURONIDASE (dar a toom ue mab; hye al ur ON i dase) treats multiple myeloma, a type of bone marrow cancer. Daratumumab works by blocking a protein that causes cancer cells to grow and multiply. This helps to slow or stop the spread of cancer cells. Hyaluronidase works by increasing the absorption of other medications in the body to help them work better. This medication may also be used treat amyloidosis, a condition that causes the buildup of a protein (amyloid) in your body. It works by reducing the buildup of this protein, which decreases symptoms. It is a combination medication that contains a monoclonal antibody. This medicine may be used for other purposes; ask your health care provider or pharmacist if you have  questions. COMMON BRAND NAME(S): DARZALEX FASPRO What should I tell my care team before I take this medication? They need to know if you have any of these conditions: Heart disease Infection, such as chickenpox, cold sores, herpes, hepatitis B Lung or breathing disease An unusual or allergic reaction to daratumumab, hyaluronidase, other medications, foods, dyes, or preservatives Pregnant or trying to get pregnant Breast-feeding How should I use this medication? This medication is injected under the skin. It is given by your care team in a hospital or clinic setting. Talk to your care team about the use of this medication in children. Special care may be needed. Overdosage: If you think you have taken too much of this medicine contact a poison control center or emergency room at once. NOTE: This medicine is only for you. Do not share this medicine with others. What if I miss a dose? Keep appointments for follow-up doses. It is important not to miss your dose. Call your care team if you are unable to keep an appointment. What may interact with this medication? Interactions have not been studied. This list may not describe all possible interactions. Give your health care provider a list of all the medicines, herbs, non-prescription drugs, or dietary supplements you use. Also tell them if you smoke, drink alcohol, or use illegal drugs. Some items may interact with your medicine. What should I watch for while using this medication? Your condition will be monitored carefully while you are receiving this medication. This medication can cause serious allergic reactions. To reduce your risk, your care team  may give you other medication to take before receiving this one. Be sure to follow the directions from your care team. This medication can affect the results of blood tests to match your blood type. These changes can last for up to 6 months after the final dose. Your care team will do blood tests to  match your blood type before you start treatment. Tell all of your care team that you are being treated with this medication before receiving a blood transfusion. This medication can affect the results of some tests used to determine treatment response; extra tests may be needed to evaluate response. Talk to your care team if you wish to become pregnant or think you are pregnant. This medication can cause serious birth defects if taken during pregnancy and for 3 months after the last dose. A reliable form of contraception is recommended while taking this medication and for 3 months after the last dose. Talk to your care team about effective forms of contraception. Do not breast-feed while taking this medication. What side effects may I notice from receiving this medication? Side effects that you should report to your care team as soon as possible: Allergic reactions--skin rash, itching, hives, swelling of the face, lips, tongue, or throat Heart rhythm changes--fast or irregular heartbeat, dizziness, feeling faint or lightheaded, chest pain, trouble breathing Infection--fever, chills, cough, sore throat, wounds that don't heal, pain or trouble when passing urine, general feeling of discomfort or being unwell Infusion reactions--chest pain, shortness of breath or trouble breathing, feeling faint or lightheaded Sudden eye pain or change in vision such as blurry vision, seeing halos around lights, vision loss Unusual bruising or bleeding Side effects that usually do not require medical attention (report to your care team if they continue or are bothersome): Constipation Diarrhea Fatigue Nausea Pain, tingling, or numbness in the hands or feet Swelling of the ankles, hands, or feet This list may not describe all possible side effects. Call your doctor for medical advice about side effects. You may report side effects to FDA at 1-800-FDA-1088. Where should I keep my medication? This medication is given  in a hospital or clinic. It will not be stored at home. NOTE: This sheet is a summary. It may not cover all possible information. If you have questions about this medicine, talk to your doctor, pharmacist, or health care provider.  2023 Elsevier/Gold Standard (2021-11-29 00:00:00)   Bortezomib Injection What is this medication? BORTEZOMIB (bor TEZ oh mib) treats lymphoma. It may also be used to treat multiple myeloma, a type of bone marrow cancer. It works by blocking a protein that causes cancer cells to grow and multiply. This helps to slow or stop the spread of cancer cells. This medicine may be used for other purposes; ask your health care provider or pharmacist if you have questions. COMMON BRAND NAME(S): Velcade What should I tell my care team before I take this medication? They need to know if you have any of these conditions: Dehydration Diabetes Heart disease Liver disease Tingling of the fingers or toes or other nerve disorder An unusual or allergic reaction to bortezomib, other medications, foods, dyes, or preservatives If you or your partner are pregnant or trying to get pregnant Breastfeeding How should I use this medication? This medication is injected into a vein or under the skin. It is given by your care team in a hospital or clinic setting. Talk to your care team about the use of this medication in children. Special care may be  needed. Overdosage: If you think you have taken too much of this medicine contact a poison control center or emergency room at once. NOTE: This medicine is only for you. Do not share this medicine with others. What if I miss a dose? Keep appointments for follow-up doses. It is important not to miss your dose. Call your care team if you are unable to keep an appointment. What may interact with this medication? Ketoconazole Rifampin This list may not describe all possible interactions. Give your health care provider a list of all the medicines,  herbs, non-prescription drugs, or dietary supplements you use. Also tell them if you smoke, drink alcohol, or use illegal drugs. Some items may interact with your medicine. What should I watch for while using this medication? Your condition will be monitored carefully while you are receiving this medication. You may need blood work while taking this medication. This medication may affect your coordination, reaction time, or judgment. Do not drive or operate machinery until you know how this medication affects you. Sit up or stand slowly to reduce the risk of dizzy or fainting spells. Drinking alcohol with this medication can increase the risk of these side effects. This medication may increase your risk of getting an infection. Call your care team for advice if you get a fever, chills, sore throat, or other symptoms of a cold or flu. Do not treat yourself. Try to avoid being around people who are sick. Check with your care team if you have severe diarrhea, nausea, and vomiting, or if you sweat a lot. The loss of too much body fluid may make it dangerous for you to take this medication. Talk to your care team if you may be pregnant. Serious birth defects can occur if you take this medication during pregnancy and for 7 months after the last dose. You will need a negative pregnancy test before starting this medication. Contraception is recommended while taking this medication and for 7 months after the last dose. Your care team can help you find the option that works for you. If your partner can get pregnant, use a condom during sex while taking this medication and for 4 months after the last dose. Do not breastfeed while taking this medication and for 2 months after the last dose. This medication may cause infertility. Talk to your care team if you are concerned about your fertility. What side effects may I notice from receiving this medication? Side effects that you should report to your care team as soon  as possible: Allergic reactions--skin rash, itching, hives, swelling of the face, lips, tongue, or throat Bleeding--bloody or black, tar-like stools, vomiting blood or Kaelen Caughlin material that looks like coffee grounds, red or dark Rosalynn Sergent urine, small red or purple spots on skin, unusual bruising or bleeding Bleeding in the brain--severe headache, stiff neck, confusion, dizziness, change in vision, numbness or weakness of the face, arm, or leg, trouble speaking, trouble walking, vomiting Bowel blockage--stomach cramping, unable to have a bowel movement or pass gas, loss of appetite, vomiting Heart failure--shortness of breath, swelling of the ankles, feet, or hands, sudden weight gain, unusual weakness or fatigue Infection--fever, chills, cough, sore throat, wounds that don't heal, pain or trouble when passing urine, general feeling of discomfort or being unwell Liver injury--right upper belly pain, loss of appetite, nausea, light-colored stool, dark yellow or Jalan Bodi urine, yellowing skin or eyes, unusual weakness or fatigue Low blood pressure--dizziness, feeling faint or lightheaded, blurry vision Lung injury--shortness of breath or trouble breathing,  cough, spitting up blood, chest pain, fever Pain, tingling, or numbness in the hands or feet Severe or prolonged diarrhea Stomach pain, bloody diarrhea, pale skin, unusual weakness or fatigue, decrease in the amount of urine, which may be signs of hemolytic uremic syndrome Sudden and severe headache, confusion, change in vision, seizures, which may be signs of posterior reversible encephalopathy syndrome (PRES) TTP--purple spots on the skin or inside the mouth, pale skin, yellowing skin or eyes, unusual weakness or fatigue, fever, fast or irregular heartbeat, confusion, change in vision, trouble speaking, trouble walking Tumor lysis syndrome (TLS)--nausea, vomiting, diarrhea, decrease in the amount of urine, dark urine, unusual weakness or fatigue, confusion,  muscle pain or cramps, fast or irregular heartbeat, joint pain Side effects that usually do not require medical attention (report to your care team if they continue or are bothersome): Constipation Diarrhea Fatigue Loss of appetite Nausea This list may not describe all possible side effects. Call your doctor for medical advice about side effects. You may report side effects to FDA at 1-800-FDA-1088. Where should I keep my medication? This medication is given in a hospital or clinic. It will not be stored at home. NOTE: This sheet is a summary. It may not cover all possible information. If you have questions about this medicine, talk to your doctor, pharmacist, or health care provider.  2023 Elsevier/Gold Standard (2022-01-03 00:00:00)        To help prevent nausea and vomiting after your treatment, we encourage you to take your nausea medication as directed.  BELOW ARE SYMPTOMS THAT SHOULD BE REPORTED IMMEDIATELY: *FEVER GREATER THAN 100.4 F (38 C) OR HIGHER *CHILLS OR SWEATING *NAUSEA AND VOMITING THAT IS NOT CONTROLLED WITH YOUR NAUSEA MEDICATION *UNUSUAL SHORTNESS OF BREATH *UNUSUAL BRUISING OR BLEEDING *URINARY PROBLEMS (pain or burning when urinating, or frequent urination) *BOWEL PROBLEMS (unusual diarrhea, constipation, pain near the anus) TENDERNESS IN MOUTH AND THROAT WITH OR WITHOUT PRESENCE OF ULCERS (sore throat, sores in mouth, or a toothache) UNUSUAL RASH, SWELLING OR PAIN  UNUSUAL VAGINAL DISCHARGE OR ITCHING   Items with * indicate a potential emergency and should be followed up as soon as possible or go to the Emergency Department if any problems should occur.  Please show the CHEMOTHERAPY ALERT CARD or IMMUNOTHERAPY ALERT CARD at check-in to the Emergency Department and triage nurse.  Should you have questions after your visit or need to cancel or reschedule your appointment, [facility] [phone]  and follow the prompts.   Office hours are 8:00 a.m. to 4:30 p.m. Monday - Friday. Please note that voicemails left after 4:00 p.m. may not be returned until the following business day.  We are closed weekends and major holidays. You have access to a nurse at all times for urgent questions. Please call the main number to the clinic [phone] and follow the prompts.  For any non-urgent questions, you may also contact your provider using MyChart. We now offer e-Visits for anyone 3718 and older to request care online for non-urgent symptoms. For details visit [email or web address].   Also download the MyChart app! Go to the app store, search "MyChart", open the app, select [place], and log in with your MyChart username and password.

## 2022-11-26 NOTE — Progress Notes (Signed)
Patient presents today for chemotherapy infusion.  Patient is in satisfactory condition with no new complaints voiced.  Vital signs are stable.  Labs reviewed and all labs are within treatment parameters.  Patient took pre-medication at home prior to visit at 0830.  We will proceed with treatment per MD orders.    Patient tolerated treatment well with no complaints voiced.  Patients port flushed without difficulty.  Good blood return noted with no bruising or swelling noted at site.  Band aid applied.  Patient left ambulatory in stable condition.  Vital signs stable at discharge.  Follow up as scheduled.

## 2022-11-26 NOTE — Progress Notes (Signed)
Patient to get Velcade and Darzalex Faspro today.  Last Darzalex Faspro was 11/12/22 and is at 2 week dosing per Dr Ellin Saba.  T.O. Dr Francia Greaves, PharmD

## 2022-11-29 ENCOUNTER — Inpatient Hospital Stay: Payer: 59

## 2022-11-29 VITALS — BP 140/77 | HR 80 | Temp 97.2°F | Resp 18

## 2022-11-29 DIAGNOSIS — Z5112 Encounter for antineoplastic immunotherapy: Secondary | ICD-10-CM | POA: Diagnosis not present

## 2022-11-29 DIAGNOSIS — C9000 Multiple myeloma not having achieved remission: Secondary | ICD-10-CM

## 2022-11-29 MED ORDER — BORTEZOMIB CHEMO SQ INJECTION 3.5 MG (2.5MG/ML)
1.0000 mg/m2 | Freq: Once | INTRAMUSCULAR | Status: AC
  Administered 2022-11-29: 2 mg via SUBCUTANEOUS
  Filled 2022-11-29: qty 0.8

## 2022-11-29 NOTE — Patient Instructions (Signed)
[facility] [place]  Discharge Instructions: Thank you for choosing [facility] to provide your oncology and hematology care.  If you have a lab appointment with the Cancer Center - please note that after April 8th, 2024, all labs will be drawn in the cancer center.  You do not have to check in or register with the main entrance as you have in the past but will complete your check-in in the cancer center.  Wear comfortable clothing and clothing appropriate for easy access to any Portacath or PICC line.   We strive to give you quality time with your provider. You may need to reschedule your appointment if you arrive late (15 or more minutes).  Arriving late affects you and other patients whose appointments are after yours.  Also, if you miss three or more appointments without notifying the office, you may be dismissed from the clinic at the provider's discretion.      For prescription refill requests, have your pharmacy contact our office and allow 72 hours for refills to be completed.    Today you received the following chemotherapy and/or immunotherapy agents Velcade      To help prevent nausea and vomiting after your treatment, we encourage you to take your nausea medication as directed.  BELOW ARE SYMPTOMS THAT SHOULD BE REPORTED IMMEDIATELY: *FEVER GREATER THAN 100.4 F (38 C) OR HIGHER *CHILLS OR SWEATING *NAUSEA AND VOMITING THAT IS NOT CONTROLLED WITH YOUR NAUSEA MEDICATION *UNUSUAL SHORTNESS OF BREATH *UNUSUAL BRUISING OR BLEEDING *URINARY PROBLEMS (pain or burning when urinating, or frequent urination) *BOWEL PROBLEMS (unusual diarrhea, constipation, pain near the anus) TENDERNESS IN MOUTH AND THROAT WITH OR WITHOUT PRESENCE OF ULCERS (sore throat, sores in mouth, or a toothache) UNUSUAL RASH, SWELLING OR PAIN  UNUSUAL VAGINAL DISCHARGE OR ITCHING   Items with * indicate a potential emergency and should be followed up as soon as possible or go to the  Emergency Department if any problems should occur.  Please show the CHEMOTHERAPY ALERT CARD or IMMUNOTHERAPY ALERT CARD at check-in to the Emergency Department and triage nurse.  Should you have questions after your visit or need to cancel or reschedule your appointment, please contact [facility] [place] [phone]  and follow the prompts.  Office hours are 8:00 a.m. to 4:30 p.m. Monday - Friday. Please note that voicemails left after 4:00 p.m. may not be returned until the following business day.  We are closed weekends and major holidays. You have access to a nurse at all times for urgent questions. Please call the main number to the clinic [phone] and follow the prompts.  For any non-urgent questions, you may also contact your provider using MyChart. We now offer e-Visits for anyone 18 and older to request care online for non-urgent symptoms. For details visit [email or web address].   Also download the MyChart app! Go to the app store, search "MyChart", open the app, select [place], and log in with your MyChart username and password.   

## 2022-11-29 NOTE — Progress Notes (Signed)
Patient presents today for Velcade injection per providers order.  Vital signs within parameters for injection.  Per order no labs needed today.  Patient has no new complaints at this time.    Patient took premedications from home.  Stable during administration without incident; injection site WNL; see MAR for injection details.  Patient tolerated procedure well and without incident.  No questions or complaints noted at this time. Discharge from clinic ambulatory in stable condition.  Alert and oriented X 3.  Follow up with [facility] as scheduled.

## 2022-12-03 ENCOUNTER — Other Ambulatory Visit: Payer: Self-pay | Admitting: *Deleted

## 2022-12-04 ENCOUNTER — Encounter: Payer: Self-pay | Admitting: Hematology

## 2022-12-04 MED ORDER — GABAPENTIN 100 MG PO CAPS: ORAL_CAPSULE | ORAL | 0 refills | Status: DC

## 2022-12-04 MED ORDER — ALPRAZOLAM 0.25 MG PO TABS: ORAL_TABLET | ORAL | 2 refills | Status: DC

## 2022-12-06 ENCOUNTER — Other Ambulatory Visit: Payer: Self-pay | Admitting: *Deleted

## 2022-12-06 MED ORDER — GABAPENTIN 300 MG PO CAPS: 300.0000 mg | ORAL_CAPSULE | Freq: Two times a day (BID) | ORAL | 0 refills | Status: DC

## 2022-12-09 NOTE — Progress Notes (Signed)
[facility] [address] [place] [address]    Clinic Day:  12/10/2022  Referring physician: Alvina Filbert, MD  Patient Care Team: Alvina Filbert, MD as PCP - General (Internal Medicine) Doreatha Massed, MD as Medical Oncologist (Medical Oncology) Lanelle Bal, DO as Consulting Physician (Gastroenterology)   ASSESSMENT & PLAN:   Assessment: 1.  IgG lambda plasma cell myeloma, stage II, standard risk: -4 cycles of KPD from 04/17/2018 through 08/21/2018, stem cell transplant on 10/23/2018. -PET scan on 01/21/2019 showed multiple bone lesions but without any hypermetabolic activity. -BMBX on 01/21/2019 with normocellular marrow with no increase in plasma cells.  Normal FISH.  MRD results negative. -Maintenance pomalidomide 2 mg 3 weeks on/1 week off started on 03/10/2019. -Bone marrow biopsy on 10/29/2019 shows trilineage hematopoiesis with no evidence of plasma cells.  Chromosome analysis and FISH are normal. -BM BX on 10/27/2020 at [facility]-normocellular marrow with 30 to 40%, TLH.  2% of the total cells are CD 138+ plasma cells. - Pomalyst was held due to dizziness from 03/29/2021 through 04/26/2021. - MRI of the brain on 03/31/2021 did not show any evidence of intracranial lesions.  Multiple bone lesions in the skull compatible with sequela of myeloma.  - Labs at [facility] on 11/16/2021: M spike 0.24 g.  Immunofixation positive for IgG lambda. - PET scan on 12/14/2021: No evidence of FDG avid osseous or soft tissue myeloma. - She has developed left shoulder blade and right posterior rib pain for the last 3 to 4 months. - 24-hour urine on 12/14/2021: Total protein 136 mg.  Immune immunofixation positive for lambda type Bence-Jones protein. -- PET scan (05/24/2022): Single new hypermetabolic lytic lesion involving left lamina of T11.  No canal encroachment.  No other hypermetabolic bone lesions.  Stable diffuse lytic lesions throughout the axial and appendicular skeleton.  No  soft tissue lesions. - Maintenance Pomalyst discontinued on 05/29/2022 - XRT to the T11 completed on 06/20/2022 - Daratumumab, Velcade and dexamethasone started on 06/11/2022   2.  Pulmonary embolism: -CT angiogram on 04/21/2020 showed filling defect at [facility]. -VQ scan confirmed pulmonary embolism. -She is on Eliquis.   3.  Right shoulder and upper arm pain: - She received XRT to the chest from 12/26/2020 through 01/06/2021.   4.  Myeloma bone disease: - Denosumab held due to exposure of bone in the right lower jaw.  Last dose in 2020.   Plan: 1.  IgG lambda plasma cell myeloma: - She is tolerating Velcade and Darzalex reasonably well. - Reviewed myeloma labs from 11/18/2021: M spike is 0.1 g, improved from 0.3 g previously.  Free light chain ratio is normal at 0.36. - Labs today: Normal LFTs.  Creatinine is stable around 1.48.  CBC was grossly normal with mild thrombocytopenia. - Proceed with cycle 7 of Velcade (1 mg/m) and daratumumab.  RTC 3 weeks for follow-up.  Will repeat myeloma labs on 12/24/2022.   2.  Back pain/left shoulder blade pain/right lateral and posterior rib pains: - Continue hydrocodone 10/325 every 6 hours as needed.   3.  Hypomagnesemia: - Magnesium is normal today.  Continue magnesium twice daily.   4.  Anxiety: - Continue Xanax 0.5 mg in the morning and 0.5 mg at bedtime.   5.  Sleeping difficulty: - She reports that Ambien is not helping.  She has tried [place], trazodone and doxepin in the past which did not help.  Will start her on Sonata 10 mg at bedtime.  6.  Peripheral neuropathy: - Numbness in the feet for the last couple of months.  Continue gabapentin 100 mg in the morning and 200 mg at bedtime.  This is helping.   7.  Pulmonary embolism: - Continue Eliquis daily.  No bleeding issues.  No orders of the defined types were placed in this encounter.     I,Katie Daubenspeck,acting as a Neurosurgeon for Doreatha Massed, MD.,have  documented all relevant documentation on the behalf of Doreatha Massed, MD,as directed by  Doreatha Massed, MD while in the presence of Doreatha Massed, MD.   I, Doreatha Massed MD, have reviewed the above documentation for accuracy and completeness, and I agree with the above.   Doreatha Massed, MD   4/22/202411:58 AM  CHIEF COMPLAINT:   Diagnosis: multiple myeloma    Cancer Staging  No matching staging information was found for the patient.   Prior Therapy: 1. KPD x 4 cycles from 04/17/2018 to 08/21/2018. 2. Stem cell transplant on 10/23/2018. 3. Radiation to left and right humerus 25 Gy in 10 fractions from 08/23/2020 to 08/25/2020. 4.  Maintenance Pomalyst until 06/08/2022  Current Therapy:  Darzalex, Velcade and dexamethasone started on 06/11/2022    HISTORY OF PRESENT ILLNESS:   Oncology History  Multiple myeloma not having achieved remission  04/07/2018 Initial Diagnosis   Multiple myeloma not having achieved remission (HCC)   06/11/2022 -  Chemotherapy   Patient is on Treatment Plan : MYELOMA RELAPSED / REFRACTORY Daratumumab SQ + Bortezomib + Dexamethasone (DaraVd) q21d / Daratumumab SQ q28d      Multiple myeloma without remission  04/15/2018 Initial Diagnosis   Multiple myeloma without remission (HCC)   04/17/2018 - 09/05/2018 Chemotherapy   The patient had dexamethasone (DECADRON) 4 MG tablet, 1 of 1 cycle, Start date: 04/15/2018, End date: 05/26/2018 palonosetron (ALOXI) injection 0.25 mg, 0.25 mg, Intravenous,  Once, 1 of 1 cycle Administration: 0.25 mg (04/17/2018), 0.25 mg (04/24/2018), 0.25 mg (05/01/2018) cyclophosphamide (CYTOXAN) 540 mg in sodium chloride 0.9 % 250 mL chemo infusion, 300 mg/m2 = 540 mg, Intravenous,  Once, 1 of 1 cycle Administration: 540 mg (04/17/2018), 540 mg (04/24/2018), 540 mg (05/01/2018) carfilzomib (KYPROLIS) 36 mg in dextrose 5 % 50 mL chemo infusion, 20 mg/m2 = 36 mg, Intravenous, Once, 5 of 5 cycles Administration: 36 mg  (04/17/2018), 36 mg (04/18/2018), 60 mg (04/24/2018), 60 mg (04/25/2018), 60 mg (05/01/2018), 60 mg (05/02/2018), 60 mg (05/15/2018), 60 mg (05/16/2018), 60 mg (06/12/2018), 60 mg (06/13/2018), 60 mg (06/26/2018), 60 mg (06/27/2018), 60 mg (07/03/2018), 60 mg (07/04/2018), 60 mg (07/24/2018), 60 mg (07/25/2018), 60 mg (07/31/2018), 60 mg (08/01/2018), 60 mg (08/08/2018), 60 mg (08/07/2018), 60 mg (08/21/2018), 60 mg (08/22/2018), 60 mg (08/28/2018), 60 mg (08/29/2018), 60 mg (09/04/2018), 60 mg (09/05/2018)  for chemotherapy treatment.    06/11/2022 -  Chemotherapy   Patient is on Treatment Plan : MYELOMA RELAPSED / REFRACTORY Daratumumab SQ + Bortezomib + Dexamethasone (DaraVd) q21d / Daratumumab SQ q28d         INTERVAL HISTORY:   Alexandra Price is a 77 y.o. female presenting to clinic today for follow up of multiple myeloma. She was last seen by me on 11/19/22.  Today, she states that she is doing well overall. Her appetite level is at 100%. Her energy level is at 50%.  PAST MEDICAL HISTORY:   Past Medical History: Past Medical History:  Diagnosis Date   Anxiety    Chronic kidney disease    Depression    Hypertension    Multiple  myeloma    multiple myeloma   Pre-diabetes     Surgical History: Past Surgical History:  Procedure Laterality Date   ABDOMINAL HYSTERECTOMY     total   APPENDECTOMY     BALLOON DILATION N/A 05/28/2022   Procedure: BALLOON DILATION;  Surgeon: Lanelle Bal, DO;  [facility];  Service: Endoscopy;  Laterality: N/A;   BIOPSY  05/28/2022   Procedure: BIOPSY;  Surgeon: Lanelle Bal, DO;  [facility];  Service: Endoscopy;;   COLONOSCOPY WITH PROPOFOL N/A 12/25/2021   Procedure: COLONOSCOPY WITH PROPOFOL;  Surgeon: Lanelle Bal, DO;  [facility]: [facility];  Service: Endoscopy;  Laterality: N/A;  2:30pm   ESOPHAGOGASTRODUODENOSCOPY (EGD) WITH PROPOFOL N/A 05/28/2022   Procedure: ESOPHAGOGASTRODUODENOSCOPY (EGD) WITH PROPOFOL;  Surgeon: Lanelle Bal,  DO;  [facility];  Service: Endoscopy;  Laterality: N/A;  10:00am, asa 3   LAPAROSCOPIC APPENDECTOMY N/A 05/20/2018   Procedure: APPENDECTOMY LAPAROSCOPIC;  Surgeon: Franky Macho, MD;  Location: [facility];  Service: General;  Laterality: N/A;   POLYPECTOMY  12/25/2021   Procedure: POLYPECTOMY;  Surgeon: Lanelle Bal, DO;  [facility]: [facility];  Service: Endoscopy;;   PORTACATH PLACEMENT Right 04/14/2018   Procedure: INSERTION PORT-A-CATH;  Surgeon: Franky Macho, MD;  Location: [facility];  Service: General;  Laterality: Right;    Social History: Social History   Socioeconomic History   Marital status: Divorced    Spouse name: Not on file   Number of children: 6   Years of education: Not on file   Highest education level: Not on file  Occupational History    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  Tobacco Use   Smoking status: Never   Smokeless tobacco: Never  Vaping Use   Vaping Use: Never used  Substance and Sexual Activity   Alcohol use: Never   Drug use: Not Currently   Sexual activity: Not Currently  Other Topics Concern   Not on file  Social History Narrative   Not on file   Social Determinants of Health   Financial Resource Strain: Low Risk  (07/06/2020)   Overall Financial Resource Strain (CARDIA)    Difficulty of Paying Living Expenses: Not hard at all  Food Insecurity: No Food Insecurity (09/17/2022)   Hunger Vital Sign    Worried About Running Out of Food in the Last Year: Never true    Ran Out of Food in the Last Year: Never true  Transportation Needs: No Transportation Needs (09/17/2022)   PRAPARE - Administrator, Civil Service (Medical): No    Lack of Transportation (Non-Medical): No  Physical Activity: Inactive (07/06/2020)   Exercise Vital Sign    Days of Exercise per Week: 0 days    Minutes of Exercise per Session: 0 min  Stress: No Stress Concern Present (07/06/2020)   [employer] of Occupational Health - Occupational Stress  Questionnaire    Feeling of Stress : Not at all  Social Connections: Moderately Isolated (07/06/2020)   Social Connection and Isolation Panel [NHANES]    Frequency of Communication with Friends and Family: More than three times a week    Frequency of Social Gatherings with Friends and Family: Twice a week    Attends Religious Services: 1 to 4 times per year    Active Member of [employer] or Organizations: No    Attends Banker Meetings: Never    Marital Status: Separated  Intimate Partner Violence: Not At Risk (09/17/2022)  Humiliation, Afraid, Rape, and Kick questionnaire    Fear of Current or Ex-Partner: No    Emotionally Abused: No    Physically Abused: No    Sexually Abused: No    Family History: Family History  Problem Relation Age of Onset   Heart disease Mother    Emphysema Father    Diabetes Sister    Depression Sister    Cancer Brother        liver, lung, and colon. colon cancer at age 44.   Diabetes Brother     Current Medications:  Current Outpatient Medications:    acetaminophen (TYLENOL) 325 MG tablet, Take 650 mg by mouth as needed for moderate pain (prior to infusion)., Disp: , Rfl:    acyclovir (ZOVIRAX) 400 MG tablet, Take 1 tablet (400 mg total) by mouth 2 (two) times daily., Disp: 60 tablet, Rfl: 6   albuterol (PROVENTIL) (2.5 MG/3ML) 0.083% nebulizer solution, Take 3 mLs (2.5 mg total) by nebulization every 4 (four) hours as needed for wheezing or shortness of breath., Disp: 75 mL, Rfl: 2   albuterol (VENTOLIN HFA) 108 (90 Base) MCG/ACT inhaler, Inhale 2 puffs into the lungs every 4 (four) hours as needed for wheezing or shortness of breath., Disp: 18 g, Rfl: 2   ALPRAZolam (XANAX) 0.25 MG tablet, Take one tablet (0.25 mg) by mouth in the morning as needed, and two tablets (0.5 mg) at bedtime as needed, Disp: 90 tablet, Rfl: 2   Calcium Carb-Cholecalciferol (CALCIUM 1000 + D PO), Take 1,000 mg by mouth daily., Disp: , Rfl:    chlorthalidone  (HYGROTON) 25 MG tablet, Take 25 mg by mouth every morning., Disp: , Rfl:    dexAMETHasone 20 MG TABS, Take 1 tablet by mouth as directed. Take 1 tablet 30 minutes prior to infusions, Disp: 30 tablet, Rfl: 2   diphenhydrAMINE (BENADRYL) 25 MG tablet, Take 50 mg by mouth as needed (prior to infusion)., Disp: , Rfl:    ELIQUIS 2.5 MG TABS tablet, Take 2.5 mg by mouth 2 (two) times daily., Disp: , Rfl:    escitalopram (LEXAPRO) 20 MG tablet, Take 20 mg by mouth daily., Disp: , Rfl:    gabapentin (NEURONTIN) 300 MG capsule, Take 1 capsule (300 mg total) by mouth 2 (two) times daily., Disp: 60 capsule, Rfl: 0   glipiZIDE (GLUCOTROL) 5 MG tablet, Take 1 tablet (5 mg total) by mouth daily before breakfast., Disp: 30 tablet, Rfl: 5   HYDROcodone-acetaminophen (NORCO) 10-325 MG tablet, Take 1 tablet by mouth every 6 (six) hours as needed., Disp: 120 tablet, Rfl: 0   magnesium oxide (MAG-OX) 400 (240 Mg) MG tablet, Take 1 tablet (400 mg total) by mouth 2 (two) times daily., Disp: 60 tablet, Rfl: 4   midodrine (PROAMATINE) 2.5 MG tablet, Take 1 tablet (2.5 mg total) by mouth 3 (three) times daily with meals., Disp: 90 tablet, Rfl: 1   Multiple Vitamin (MULTI VITAMIN) TABS, Take 1 tablet by mouth daily., Disp: , Rfl:    pantoprazole (PROTONIX) 40 MG tablet, Take 1 tablet (40 mg total) by mouth daily., Disp: 90 tablet, Rfl: 6   polyethylene glycol (MIRALAX / GLYCOLAX) 17 g packet, Take 17 g by mouth daily as needed for moderate constipation., Disp: 14 each, Rfl: 0   rosuvastatin (CRESTOR) 10 MG tablet, Take 10 mg by mouth daily., Disp: , Rfl:    zaleplon (SONATA) 10 MG capsule, Take 1 capsule (10 mg total) by mouth at bedtime as needed for sleep., Disp: 30 capsule,  Rfl: 3   prochlorperazine (COMPAZINE) 10 MG tablet, Take 1 tablet (10 mg total) by mouth every 6 (six) hours as needed for nausea or vomiting. (Patient not taking: Reported on 12/10/2022), Disp: 30 tablet, Rfl: 0 No current facility-administered  medications for this visit.  Facility-Administered Medications Ordered in Other Visits:    sodium chloride flush (NS) 0.9 % injection 10 mL, 10 mL, Intravenous, PRN, Doreatha Massed, MD, 10 mL at 12/10/22 1128   Allergies: Allergies  Allergen Reactions   Ciprofloxacin Anaphylaxis   Amoxicillin Other (See Comments)    unknown   Morphine And Related Nausea And Vomiting   Augmentin [Amoxicillin-Pot Clavulanate] Other (See Comments)    Headache, insomnia    REVIEW OF SYSTEMS:   Review of Systems  Constitutional:  Negative for chills, fatigue and fever.  HENT:   Negative for lump/mass, mouth sores, nosebleeds, sore throat and trouble swallowing.   Eyes:  Negative for eye problems.  Respiratory:  Negative for cough and shortness of breath.   Cardiovascular:  Negative for chest pain, leg swelling and palpitations.  Gastrointestinal:  Negative for abdominal pain, constipation, diarrhea, nausea and vomiting.  Genitourinary:  Negative for bladder incontinence, difficulty urinating, dysuria, frequency, hematuria and nocturia.   Musculoskeletal:  Negative for arthralgias, back pain, flank pain, myalgias and neck pain.  Skin:  Negative for itching and rash.  Neurological:  Positive for dizziness. Negative for headaches and numbness.  Hematological:  Does not bruise/bleed easily.  Psychiatric/Behavioral:  Positive for sleep disturbance. Negative for depression and suicidal ideas. The patient is not nervous/anxious.   All other systems reviewed and are negative.    VITALS:   There were no vitals taken for this visit.  Wt Readings from Last 3 Encounters:  12/10/22 176 lb 3.2 oz (79.9 kg)  11/26/22 171 lb 9.6 oz (77.8 kg)  11/19/22 169 lb 12.8 oz (77 kg)    There is no height or weight on file to calculate BMI.  Performance status (ECOG): 1 - Symptomatic but completely ambulatory  PHYSICAL EXAM:   Physical Exam Vitals and nursing note reviewed. Exam conducted with a chaperone  present.  Constitutional:      Appearance: Normal appearance.  Cardiovascular:     Rate and Rhythm: Normal rate and regular rhythm.     Pulses: Normal pulses.     Heart sounds: Normal heart sounds.  Pulmonary:     Effort: Pulmonary effort is normal.     Breath sounds: Normal breath sounds.  Abdominal:     Palpations: Abdomen is soft. There is no hepatomegaly, splenomegaly or mass.     Tenderness: There is no abdominal tenderness.  Musculoskeletal:     Right lower leg: No edema.     Left lower leg: No edema.  Lymphadenopathy:     Cervical: No cervical adenopathy.     Right cervical: No superficial, deep or posterior cervical adenopathy.    Left cervical: No superficial, deep or posterior cervical adenopathy.     Upper Body:     Right upper body: No supraclavicular or axillary adenopathy.     Left upper body: No supraclavicular or axillary adenopathy.  Neurological:     General: No focal deficit present.     Mental Status: She is alert and oriented to person, place, and time.  Psychiatric:        Mood and Affect: Mood normal.        Behavior: Behavior normal.     LABS:      Latest  Ref Rng & Units 12/10/2022    8:29 AM 11/26/2022    8:03 AM 11/19/2022    8:08 AM  CBC  WBC 4.0 - 10.5 K/uL 4.3  4.2  4.9   Hemoglobin 12.0 - 15.0 g/dL 96.0  45.4  09.8   Hematocrit 36.0 - 46.0 % 32.3  32.5  32.5   Platelets 150 - 400 K/uL 108  106  134       Latest Ref Rng & Units 12/10/2022    8:29 AM 11/26/2022    8:03 AM 11/19/2022    8:08 AM  CMP  Glucose 70 - 99 mg/dL 119  147  829   BUN 8 - 23 mg/dL Creatinine 0.44 - 1.00 mg/dL 5.62  1.30  8.65   Sodium 135 - 145 mmol/L 138  137  140   Potassium 3.5 - 5.1 mmol/L 4.6  4.2  4.0   Chloride 98 - 111 mmol/L 100  102  106   CO2 22 - 32 mmol/L Calcium 8.9 - 10.3 mg/dL 8.7  9.0  9.0   Total Protein 6.5 - 8.1 g/dL 6.2  6.2  6.6   Total Bilirubin 0.3 - 1.2 mg/dL 0.6  0.4  0.7   Alkaline Phos 38 - 126 U/L 60  67  66    AST 15 - 41 U/L ALT 0 - 44 U/L No results found for: "CEA1", "CEA" / No results found for: "CEA1", "CEA" No results found for: "PSA1" No results found for: "CAN199" No results found for: "CAN125"  Lab Results  Component Value Date   TOTALPROTELP 6.0 11/19/2022   ALBUMINELP 3.6 11/19/2022   A1GS 0.3 11/19/2022   A2GS 0.9 11/19/2022   BETS 0.8 11/19/2022   GAMS 0.4 11/19/2022   MSPIKE 0.1 (H) 11/19/2022   SPEI Comment 11/19/2022   Lab Results  Component Value Date   TIBC 290 09/17/2022   TIBC 278 07/30/2022   TIBC 272 05/24/2022   FERRITIN 1,059 (H) 09/17/2022   FERRITIN 313 (H) 07/30/2022   FERRITIN 435 (H) 05/24/2022   IRONPCTSAT 10 (L) 09/17/2022   IRONPCTSAT 21 07/30/2022   IRONPCTSAT 27 05/24/2022   Lab Results  Component Value Date   LDH 129 05/24/2022   LDH 135 03/20/2022   LDH 129 12/14/2021     STUDIES:   No results found.

## 2022-12-10 ENCOUNTER — Inpatient Hospital Stay (HOSPITAL_BASED_OUTPATIENT_CLINIC_OR_DEPARTMENT_OTHER): Payer: 59 | Admitting: Hematology

## 2022-12-10 ENCOUNTER — Inpatient Hospital Stay: Payer: 59

## 2022-12-10 VITALS — BP 144/71 | HR 80 | Temp 98.3°F | Resp 20 | Wt 176.2 lb

## 2022-12-10 DIAGNOSIS — C9000 Multiple myeloma not having achieved remission: Secondary | ICD-10-CM

## 2022-12-10 DIAGNOSIS — Z95828 Presence of other vascular implants and grafts: Secondary | ICD-10-CM

## 2022-12-10 DIAGNOSIS — Z5112 Encounter for antineoplastic immunotherapy: Secondary | ICD-10-CM | POA: Diagnosis not present

## 2022-12-10 LAB — COMPREHENSIVE METABOLIC PANEL
ALT: 19 U/L (ref 0–44)
AST: 19 U/L (ref 15–41)
Albumin: 3.7 g/dL (ref 3.5–5.0)
Alkaline Phosphatase: 60 U/L (ref 38–126)
Anion gap: 9 (ref 5–15)
BUN: 25 mg/dL — ABNORMAL HIGH (ref 8–23)
CO2: 29 mmol/L (ref 22–32)
Calcium: 8.7 mg/dL — ABNORMAL LOW (ref 8.9–10.3)
Chloride: 100 mmol/L (ref 98–111)
Creatinine, Ser: 1.48 mg/dL — ABNORMAL HIGH (ref 0.44–1.00)
GFR, Estimated: 37 mL/min — ABNORMAL LOW (ref >60)
Glucose, Bld: 155 mg/dL — ABNORMAL HIGH (ref 70–99)
Potassium: 4.6 mmol/L (ref 3.5–5.1)
Sodium: 138 mmol/L (ref 135–145)
Total Bilirubin: 0.6 mg/dL (ref 0.3–1.2)
Total Protein: 6.2 g/dL — ABNORMAL LOW (ref 6.5–8.1)

## 2022-12-10 LAB — CBC WITH DIFFERENTIAL/PLATELET
Abs Immature Granulocytes: 0.01 10*3/uL (ref 0.00–0.07)
Basophils Absolute: 0 10*3/uL (ref 0.0–0.1)
Basophils Relative: 1 %
Eosinophils Absolute: 0.2 10*3/uL (ref 0.0–0.5)
Eosinophils Relative: 4 %
HCT: 32.3 % — ABNORMAL LOW (ref 36.0–46.0)
Hemoglobin: 10.1 g/dL — ABNORMAL LOW (ref 12.0–15.0)
Immature Granulocytes: 0 %
Lymphocytes Relative: 9 %
Lymphs Abs: 0.4 10*3/uL — ABNORMAL LOW (ref 0.7–4.0)
MCH: 33.4 pg (ref 26.0–34.0)
MCHC: 31.3 g/dL (ref 30.0–36.0)
MCV: 107 fL — ABNORMAL HIGH (ref 80.0–100.0)
Monocytes Absolute: 0.4 10*3/uL (ref 0.1–1.0)
Monocytes Relative: 8 %
Neutro Abs: 3.4 10*3/uL (ref 1.7–7.7)
Neutrophils Relative %: 78 %
Platelets: 108 10*3/uL — ABNORMAL LOW (ref 150–400)
RBC: 3.02 MIL/uL — ABNORMAL LOW (ref 3.87–5.11)
RDW: 12.2 % (ref 11.5–15.5)
WBC: 4.3 10*3/uL (ref 4.0–10.5)
nRBC: 0 % (ref 0.0–0.2)

## 2022-12-10 LAB — MAGNESIUM: Magnesium: 2.2 mg/dL (ref 1.7–2.4)

## 2022-12-10 MED ORDER — DARATUMUMAB-HYALURONIDASE-FIHJ 1800-30000 MG-UT/15ML ~~LOC~~ SOLN
1800.0000 mg | Freq: Once | SUBCUTANEOUS | Status: AC
  Administered 2022-12-10: 1800 mg via SUBCUTANEOUS
  Filled 2022-12-10: qty 15

## 2022-12-10 MED ORDER — SODIUM CHLORIDE 0.9% FLUSH
10.0000 mL | INTRAVENOUS | Status: DC | PRN
  Administered 2022-12-10: 10 mL via INTRAVENOUS

## 2022-12-10 MED ORDER — ZALEPLON 10 MG PO CAPS: 10.0000 mg | ORAL_CAPSULE | Freq: Every evening | ORAL | 3 refills | Status: DC | PRN

## 2022-12-10 MED ORDER — BORTEZOMIB CHEMO SQ INJECTION 3.5 MG (2.5MG/ML)
1.0000 mg/m2 | Freq: Once | INTRAMUSCULAR | Status: AC
  Administered 2022-12-10: 2 mg via SUBCUTANEOUS
  Filled 2022-12-10: qty 0.8

## 2022-12-10 MED ORDER — DIPHENHYDRAMINE HCL 25 MG PO CAPS: 50.0000 mg | ORAL_CAPSULE | Freq: Once | ORAL | Status: DC

## 2022-12-10 MED ORDER — SODIUM CHLORIDE 0.9% FLUSH
10.0000 mL | Freq: Once | INTRAVENOUS | Status: AC
  Administered 2022-12-10: 10 mL via INTRAVENOUS

## 2022-12-10 MED ORDER — HEPARIN SOD (PORK) LOCK FLUSH 100 UNIT/ML IV SOLN
500.0000 [IU] | Freq: Once | INTRAVENOUS | Status: AC
  Administered 2022-12-10: 500 [IU] via INTRAVENOUS

## 2022-12-10 MED ORDER — ACETAMINOPHEN 325 MG PO TABS: 650.0000 mg | ORAL_TABLET | Freq: Once | ORAL | Status: DC

## 2022-12-10 MED ORDER — DEXAMETHASONE 4 MG PO TABS: 20.0000 mg | ORAL_TABLET | Freq: Once | ORAL | Status: DC

## 2022-12-10 NOTE — Progress Notes (Signed)
Patient presents today for Daratumumab and Velcade injections. Patient is in satisfactory condition with no new complaints voiced.  Vital signs are stable.  Labs reviewed by Dr. Ellin Saba during the office visit and all labs are within treatment parameters.  Tylenol, Benadryl, and Decadron taken prior to treatment appointment at 0955.  We will proceed with injections per MD orders.   Patient tolerated injections well with no complaints voiced.  Port de-accessed with no issues.  Port flushed well with good blood return noted.  Patient left ambulatory in stable condition.  Vital signs stable at discharge.  Follow up as scheduled.

## 2022-12-10 NOTE — Patient Instructions (Signed)
[facility]  Discharge Instructions: Thank you for choosing [facility] to provide your oncology and hematology care.  If you have a lab appointment with the Cancer Center - please note that after April 8th, 2024, all labs will be drawn in the cancer center.  You do not have to check in or register with the main entrance as you have in the past but will complete your check-in in the cancer center.  Wear comfortable clothing and clothing appropriate for easy access to any Portacath or PICC line.   We strive to give you quality time with your provider. You may need to reschedule your appointment if you arrive late (15 or more minutes).  Arriving late affects you and other patients whose appointments are after yours.  Also, if you miss three or more appointments without notifying the office, you may be dismissed from the clinic at the provider's discretion.      For prescription refill requests, have your pharmacy contact our office and allow 72 hours for refills to be completed.    Today you received the following chemotherapy and/or immunotherapy agents Daratumumab/Velcade.  Bortezomib Injection What is this medication? BORTEZOMIB (bor TEZ oh mib) treats lymphoma. It may also be used to treat multiple myeloma, a type of bone marrow cancer. It works by blocking a protein that causes cancer cells to grow and multiply. This helps to slow or stop the spread of cancer cells. This medicine may be used for other purposes; ask your health care provider or pharmacist if you have questions. COMMON BRAND NAME(S): Velcade What should I tell my care team before I take this medication? They need to know if you have any of these conditions: Dehydration Diabetes Heart disease Liver disease Tingling of the fingers or toes or other nerve disorder An unusual or allergic reaction to bortezomib, other medications, foods, dyes, or preservatives If you or your partner are  pregnant or trying to get pregnant Breastfeeding How should I use this medication? This medication is injected into a vein or under the skin. It is given by your care team in a hospital or clinic setting. Talk to your care team about the use of this medication in children. Special care may be needed. Overdosage: If you think you have taken too much of this medicine contact a poison control center or emergency room at once. NOTE: This medicine is only for you. Do not share this medicine with others. What if I miss a dose? Keep appointments for follow-up doses. It is important not to miss your dose. Call your care team if you are unable to keep an appointment. What may interact with this medication? Ketoconazole Rifampin This list may not describe all possible interactions. Give your health care provider a list of all the medicines, herbs, non-prescription drugs, or dietary supplements you use. Also tell them if you smoke, drink alcohol, or use illegal drugs. Some items may interact with your medicine. What should I watch for while using this medication? Your condition will be monitored carefully while you are receiving this medication. You may need blood work while taking this medication. This medication may affect your coordination, reaction time, or judgment. Do not drive or operate machinery until you know how this medication affects you. Sit up or stand slowly to reduce the risk of dizzy or fainting spells. Drinking alcohol with this medication can increase the risk of these side effects. This medication may increase your risk of getting an infection.  Call your care team for advice if you get a fever, chills, sore throat, or other symptoms of a cold or flu. Do not treat yourself. Try to avoid being around people who are sick. Check with your care team if you have severe diarrhea, nausea, and vomiting, or if you sweat a lot. The loss of too much body fluid may make it dangerous for you to take  this medication. Talk to your care team if you may be pregnant. Serious birth defects can occur if you take this medication during pregnancy and for 7 months after the last dose. You will need a negative pregnancy test before starting this medication. Contraception is recommended while taking this medication and for 7 months after the last dose. Your care team can help you find the option that works for you. If your partner can get pregnant, use a condom during sex while taking this medication and for 4 months after the last dose. Do not breastfeed while taking this medication and for 2 months after the last dose. This medication may cause infertility. Talk to your care team if you are concerned about your fertility. What side effects may I notice from receiving this medication? Side effects that you should report to your care team as soon as possible: Allergic reactions--skin rash, itching, hives, swelling of the face, lips, tongue, or throat Bleeding--bloody or black, tar-like stools, vomiting blood or Braulio Kiedrowski material that looks like coffee grounds, red or dark Poet Hineman urine, small red or purple spots on skin, unusual bruising or bleeding Bleeding in the brain--severe headache, stiff neck, confusion, dizziness, change in vision, numbness or weakness of the face, arm, or leg, trouble speaking, trouble walking, vomiting Bowel blockage--stomach cramping, unable to have a bowel movement or pass gas, loss of appetite, vomiting Heart failure--shortness of breath, swelling of the ankles, feet, or hands, sudden weight gain, unusual weakness or fatigue Infection--fever, chills, cough, sore throat, wounds that don't heal, pain or trouble when passing urine, general feeling of discomfort or being unwell Liver injury--right upper belly pain, loss of appetite, nausea, light-colored stool, dark yellow or Michail Boyte urine, yellowing skin or eyes, unusual weakness or fatigue Low blood pressure--dizziness, feeling faint or  lightheaded, blurry vision Lung injury--shortness of breath or trouble breathing, cough, spitting up blood, chest pain, fever Pain, tingling, or numbness in the hands or feet Severe or prolonged diarrhea Stomach pain, bloody diarrhea, pale skin, unusual weakness or fatigue, decrease in the amount of urine, which may be signs of hemolytic uremic syndrome Sudden and severe headache, confusion, change in vision, seizures, which may be signs of posterior reversible encephalopathy syndrome (PRES) TTP--purple spots on the skin or inside the mouth, pale skin, yellowing skin or eyes, unusual weakness or fatigue, fever, fast or irregular heartbeat, confusion, change in vision, trouble speaking, trouble walking Tumor lysis syndrome (TLS)--nausea, vomiting, diarrhea, decrease in the amount of urine, dark urine, unusual weakness or fatigue, confusion, muscle pain or cramps, fast or irregular heartbeat, joint pain Side effects that usually do not require medical attention (report to your care team if they continue or are bothersome): Constipation Diarrhea Fatigue Loss of appetite Nausea This list may not describe all possible side effects. Call your doctor for medical advice about side effects. You may report side effects to FDA at 1-800-FDA-1088. Where should I keep my medication? This medication is given in a hospital or clinic. It will not be stored at home. NOTE: This sheet is a summary. It may not cover all possible information.  If you have questions about this medicine, talk to your doctor, pharmacist, or health care provider.  2023 Elsevier/Gold Standard (2022-01-03 00:00:00)    Daratumumab; Hyaluronidase Injection What is this medication? DARATUMUMAB; HYALURONIDASE (dar a toom ue mab; hye al ur ON i dase) treats multiple myeloma, a type of bone marrow cancer. Daratumumab works by blocking a protein that causes cancer cells to grow and multiply. This helps to slow or stop the spread of cancer  cells. Hyaluronidase works by increasing the absorption of other medications in the body to help them work better. This medication may also be used treat amyloidosis, a condition that causes the buildup of a protein (amyloid) in your body. It works by reducing the buildup of this protein, which decreases symptoms. It is a combination medication that contains a monoclonal antibody. This medicine may be used for other purposes; ask your health care provider or pharmacist if you have questions. COMMON BRAND NAME(S): DARZALEX FASPRO What should I tell my care team before I take this medication? They need to know if you have any of these conditions: Heart disease Infection, such as chickenpox, cold sores, herpes, hepatitis B Lung or breathing disease An unusual or allergic reaction to daratumumab, hyaluronidase, other medications, foods, dyes, or preservatives Pregnant or trying to get pregnant Breast-feeding How should I use this medication? This medication is injected under the skin. It is given by your care team in a hospital or clinic setting. Talk to your care team about the use of this medication in children. Special care may be needed. Overdosage: If you think you have taken too much of this medicine contact a poison control center or emergency room at once. NOTE: This medicine is only for you. Do not share this medicine with others. What if I miss a dose? Keep appointments for follow-up doses. It is important not to miss your dose. Call your care team if you are unable to keep an appointment. What may interact with this medication? Interactions have not been studied. This list may not describe all possible interactions. Give your health care provider a list of all the medicines, herbs, non-prescription drugs, or dietary supplements you use. Also tell them if you smoke, drink alcohol, or use illegal drugs. Some items may interact with your medicine. What should I watch for while using this  medication? Your condition will be monitored carefully while you are receiving this medication. This medication can cause serious allergic reactions. To reduce your risk, your care team may give you other medication to take before receiving this one. Be sure to follow the directions from your care team. This medication can affect the results of blood tests to match your blood type. These changes can last for up to 6 months after the final dose. Your care team will do blood tests to match your blood type before you start treatment. Tell all of your care team that you are being treated with this medication before receiving a blood transfusion. This medication can affect the results of some tests used to determine treatment response; extra tests may be needed to evaluate response. Talk to your care team if you wish to become pregnant or think you are pregnant. This medication can cause serious birth defects if taken during pregnancy and for 3 months after the last dose. A reliable form of contraception is recommended while taking this medication and for 3 months after the last dose. Talk to your care team about effective forms of contraception. Do not breast-feed while  taking this medication. What side effects may I notice from receiving this medication? Side effects that you should report to your care team as soon as possible: Allergic reactions--skin rash, itching, hives, swelling of the face, lips, tongue, or throat Heart rhythm changes--fast or irregular heartbeat, dizziness, feeling faint or lightheaded, chest pain, trouble breathing Infection--fever, chills, cough, sore throat, wounds that don't heal, pain or trouble when passing urine, general feeling of discomfort or being unwell Infusion reactions--chest pain, shortness of breath or trouble breathing, feeling faint or lightheaded Sudden eye pain or change in vision such as blurry vision, seeing halos around lights, vision loss Unusual bruising or  bleeding Side effects that usually do not require medical attention (report to your care team if they continue or are bothersome): Constipation Diarrhea Fatigue Nausea Pain, tingling, or numbness in the hands or feet Swelling of the ankles, hands, or feet This list may not describe all possible side effects. Call your doctor for medical advice about side effects. You may report side effects to FDA at 1-800-FDA-1088. Where should I keep my medication? This medication is given in a hospital or clinic. It will not be stored at home. NOTE: This sheet is a summary. It may not cover all possible information. If you have questions about this medicine, talk to your doctor, pharmacist, or health care provider.  2023 Elsevier/Gold Standard (2021-11-29 00:00:00)        To help prevent nausea and vomiting after your treatment, we encourage you to take your nausea medication as directed.  BELOW ARE SYMPTOMS THAT SHOULD BE REPORTED IMMEDIATELY: *FEVER GREATER THAN 100.4 F (38 C) OR HIGHER *CHILLS OR SWEATING *NAUSEA AND VOMITING THAT IS NOT CONTROLLED WITH YOUR NAUSEA MEDICATION *UNUSUAL SHORTNESS OF BREATH *UNUSUAL BRUISING OR BLEEDING *URINARY PROBLEMS (pain or burning when urinating, or frequent urination) *BOWEL PROBLEMS (unusual diarrhea, constipation, pain near the anus) TENDERNESS IN MOUTH AND THROAT WITH OR WITHOUT PRESENCE OF ULCERS (sore throat, sores in mouth, or a toothache) UNUSUAL RASH, SWELLING OR PAIN  UNUSUAL VAGINAL DISCHARGE OR ITCHING   Items with * indicate a potential emergency and should be followed up as soon as possible or go to the Emergency Department if any problems should occur.  Please show the CHEMOTHERAPY ALERT CARD or IMMUNOTHERAPY ALERT CARD at check-in to the Emergency Department and triage nurse.  Should you have questions after your visit or need to cancel or reschedule your appointment, please contact [facility] [phone]  and  follow the prompts.  Office hours are 8:00 a.m. to 4:30 p.m. Monday - Friday. Please note that voicemails left after 4:00 p.m. may not be returned until the following business day.  We are closed weekends and major holidays. You have access to a nurse at all times for urgent questions. Please call the main number to the clinic [phone] and follow the prompts.  For any non-urgent questions, you may also contact your provider using MyChart. We now offer e-Visits for anyone 34 and older to request care online for non-urgent symptoms. For details visit [email or web address].   Also download the MyChart app! Go to the app store, search "MyChart", open the app, select [place], and log in with your MyChart username and password.

## 2022-12-10 NOTE — Progress Notes (Signed)
Patient has been examined by Dr. Katragadda. Vital signs and labs have been reviewed by MD - ANC, Creatinine, LFTs, hemoglobin, and platelets are within treatment parameters per M.D. - pt may proceed with treatment.  Primary RN and pharmacy notified.  

## 2022-12-10 NOTE — Patient Instructions (Signed)
[facility] at [facility] Discharge Instructions   You were seen and examined today by Dr. Katragadda.  He reviewed the results of your lab work which are normal/stable.   We will proceed with your treatment today.  Return as scheduled.    Thank you for choosing  Cancer Center at [facility] to provide your oncology and hematology care.  To afford each patient quality time with our provider, please arrive at least 15 minutes before your scheduled appointment time.   If you have a lab appointment with the Cancer Center please come in thru the Main Entrance and check in at the main information desk.  You need to re-schedule your appointment should you arrive 10 or more minutes late.  We strive to give you quality time with our providers, and arriving late affects you and other patients whose appointments are after yours.  Also, if you no show three or more times for appointments you may be dismissed from the clinic at the providers discretion.     Again, thank you for choosing [facility].  Our hope is that these requests will decrease the amount of time that you wait before being seen by our physicians.       _____________________________________________________________  Should you have questions after your visit to [facility], please contact our office at [phone] and follow the prompts.  Our office hours are 8:00 a.m. and 4:30 p.m. Monday - Friday.  Please note that voicemails left after 4:00 p.m. may not be returned until the following business day.  We are closed weekends and major holidays.  You do have access to a nurse 24-7, just call the main number to the clinic [phone] and do not press any options, hold on the line and a nurse will answer the phone.    For prescription refill requests, have your pharmacy contact our office and allow 72 hours.    Due to Covid, you will need to wear a mask upon entering  the hospital. If you do not have a mask, a mask will be given to you at the Main Entrance upon arrival. For doctor visits, patients may have 1 support person age 18 or older with them. For treatment visits, patients can not have anyone with them due to social distancing guidelines and our immunocompromised population.      

## 2022-12-11 ENCOUNTER — Other Ambulatory Visit: Payer: Self-pay

## 2022-12-12 ENCOUNTER — Other Ambulatory Visit: Payer: Self-pay

## 2022-12-13 ENCOUNTER — Inpatient Hospital Stay: Payer: 59

## 2022-12-13 VITALS — BP 140/81 | HR 65 | Temp 97.2°F | Resp 16

## 2022-12-13 DIAGNOSIS — Z5112 Encounter for antineoplastic immunotherapy: Secondary | ICD-10-CM | POA: Diagnosis not present

## 2022-12-13 DIAGNOSIS — C9000 Multiple myeloma not having achieved remission: Secondary | ICD-10-CM

## 2022-12-13 MED ORDER — BORTEZOMIB CHEMO SQ INJECTION 3.5 MG (2.5MG/ML)
1.0000 mg/m2 | Freq: Once | INTRAMUSCULAR | Status: AC
  Administered 2022-12-13: 2 mg via SUBCUTANEOUS
  Filled 2022-12-13: qty 0.8

## 2022-12-13 NOTE — Patient Instructions (Signed)
[facility]  Discharge Instructions: Thank you for choosing [facility] to provide your oncology and hematology care.  If you have a lab appointment with the Cancer Center - please note that after April 8th, 2024, all labs will be drawn in the cancer center.  You do not have to check in or register with the main entrance as you have in the past but will complete your check-in in the cancer center.  Wear comfortable clothing and clothing appropriate for easy access to any Portacath or PICC line.   We strive to give you quality time with your provider. You may need to reschedule your appointment if you arrive late (15 or more minutes).  Arriving late affects you and other patients whose appointments are after yours.  Also, if you miss three or more appointments without notifying the office, you may be dismissed from the clinic at the provider's discretion.      For prescription refill requests, have your pharmacy contact our office and allow 72 hours for refills to be completed.    Today you received the following chemotherapy and/or immunotherapy agents: bortezomib      To help prevent nausea and vomiting after your treatment, we encourage you to take your nausea medication as directed.  BELOW ARE SYMPTOMS THAT SHOULD BE REPORTED IMMEDIATELY: *FEVER GREATER THAN 100.4 F (38 C) OR HIGHER *CHILLS OR SWEATING *NAUSEA AND VOMITING THAT IS NOT CONTROLLED WITH YOUR NAUSEA MEDICATION *UNUSUAL SHORTNESS OF BREATH *UNUSUAL BRUISING OR BLEEDING *URINARY PROBLEMS (pain or burning when urinating, or frequent urination) *BOWEL PROBLEMS (unusual diarrhea, constipation, pain near the anus) TENDERNESS IN MOUTH AND THROAT WITH OR WITHOUT PRESENCE OF ULCERS (sore throat, sores in mouth, or a toothache) UNUSUAL RASH, SWELLING OR PAIN  UNUSUAL VAGINAL DISCHARGE OR ITCHING   Items with * indicate a potential emergency and should be followed up as soon as possible or go to the  Emergency Department if any problems should occur.  Please show the CHEMOTHERAPY ALERT CARD or IMMUNOTHERAPY ALERT CARD at check-in to the Emergency Department and triage nurse.  Should you have questions after your visit or need to cancel or reschedule your appointment, please contact [facility] AT [facility] [phone]  and follow the prompts.  Office hours are 8:00 a.m. to 4:30 p.m. Monday - Friday. Please note that voicemails left after 4:00 p.m. may not be returned until the following business day.  We are closed weekends and major holidays. You have access to a nurse at all times for urgent questions. Please call the main number to the clinic [phone] and follow the prompts.  For any non-urgent questions, you may also contact your provider using MyChart. We now offer e-Visits for anyone 35 and older to request care online for non-urgent symptoms. For details visit [email or web address].   Also download the MyChart app! Go to the app store, search "MyChart", open the app, select , and log in with your MyChart username and password.

## 2022-12-17 ENCOUNTER — Inpatient Hospital Stay: Payer: 59

## 2022-12-17 ENCOUNTER — Other Ambulatory Visit: Payer: Self-pay | Admitting: *Deleted

## 2022-12-17 VITALS — BP 148/78 | HR 75 | Temp 97.3°F | Resp 18 | Ht 63.5 in | Wt 172.0 lb

## 2022-12-17 DIAGNOSIS — C9000 Multiple myeloma not having achieved remission: Secondary | ICD-10-CM

## 2022-12-17 DIAGNOSIS — Z5112 Encounter for antineoplastic immunotherapy: Secondary | ICD-10-CM | POA: Diagnosis not present

## 2022-12-17 DIAGNOSIS — Z95828 Presence of other vascular implants and grafts: Secondary | ICD-10-CM

## 2022-12-17 LAB — COMPREHENSIVE METABOLIC PANEL
ALT: 16 U/L (ref 0–44)
AST: 14 U/L — ABNORMAL LOW (ref 15–41)
Albumin: 4 g/dL (ref 3.5–5.0)
Alkaline Phosphatase: 59 U/L (ref 38–126)
Anion gap: 9 (ref 5–15)
BUN: 28 mg/dL — ABNORMAL HIGH (ref 8–23)
CO2: 26 mmol/L (ref 22–32)
Calcium: 9.1 mg/dL (ref 8.9–10.3)
Chloride: 100 mmol/L (ref 98–111)
Creatinine, Ser: 1.5 mg/dL — ABNORMAL HIGH (ref 0.44–1.00)
GFR, Estimated: 36 mL/min — ABNORMAL LOW (ref >60)
Glucose, Bld: 147 mg/dL — ABNORMAL HIGH (ref 70–99)
Potassium: 4.3 mmol/L (ref 3.5–5.1)
Sodium: 135 mmol/L (ref 135–145)
Total Bilirubin: 0.6 mg/dL (ref 0.3–1.2)
Total Protein: 6.5 g/dL (ref 6.5–8.1)

## 2022-12-17 LAB — CBC WITH DIFFERENTIAL/PLATELET
Abs Immature Granulocytes: 0.01 10*3/uL (ref 0.00–0.07)
Basophils Absolute: 0 10*3/uL (ref 0.0–0.1)
Basophils Relative: 1 %
Eosinophils Absolute: 0.3 10*3/uL (ref 0.0–0.5)
Eosinophils Relative: 10 %
HCT: 32.3 % — ABNORMAL LOW (ref 36.0–46.0)
Hemoglobin: 10.4 g/dL — ABNORMAL LOW (ref 12.0–15.0)
Immature Granulocytes: 0 %
Lymphocytes Relative: 12 %
Lymphs Abs: 0.4 10*3/uL — ABNORMAL LOW (ref 0.7–4.0)
MCH: 33.5 pg (ref 26.0–34.0)
MCHC: 32.2 g/dL (ref 30.0–36.0)
MCV: 104.2 fL — ABNORMAL HIGH (ref 80.0–100.0)
Monocytes Absolute: 0.4 10*3/uL (ref 0.1–1.0)
Monocytes Relative: 13 %
Neutro Abs: 2.1 10*3/uL (ref 1.7–7.7)
Neutrophils Relative %: 64 %
Platelets: 96 10*3/uL — ABNORMAL LOW (ref 150–400)
RBC: 3.1 MIL/uL — ABNORMAL LOW (ref 3.87–5.11)
RDW: 12.5 % (ref 11.5–15.5)
WBC: 3.3 10*3/uL — ABNORMAL LOW (ref 4.0–10.5)
nRBC: 0 % (ref 0.0–0.2)

## 2022-12-17 MED ORDER — HEPARIN SOD (PORK) LOCK FLUSH 100 UNIT/ML IV SOLN
500.0000 [IU] | Freq: Once | INTRAVENOUS | Status: AC
  Administered 2022-12-17: 500 [IU] via INTRAVENOUS

## 2022-12-17 MED ORDER — BORTEZOMIB CHEMO SQ INJECTION 3.5 MG (2.5MG/ML)
1.0000 mg/m2 | Freq: Once | INTRAMUSCULAR | Status: AC
  Administered 2022-12-17: 2 mg via SUBCUTANEOUS
  Filled 2022-12-17: qty 0.8

## 2022-12-17 MED ORDER — DEXAMETHASONE 4 MG PO TABS: 20.0000 mg | ORAL_TABLET | Freq: Once | ORAL | Status: DC

## 2022-12-17 MED ORDER — MIDODRINE HCL 2.5 MG PO TABS: 2.5000 mg | ORAL_TABLET | Freq: Three times a day (TID) | ORAL | 1 refills | Status: DC

## 2022-12-17 MED ORDER — SODIUM CHLORIDE 0.9% FLUSH
10.0000 mL | Freq: Once | INTRAVENOUS | Status: AC
  Administered 2022-12-17: 10 mL via INTRAVENOUS

## 2022-12-17 NOTE — Patient Instructions (Signed)
[facility]  Discharge Instructions: Thank you for choosing [facility] to provide your oncology and hematology care.  If you have a lab appointment with the Cancer Center - please note that after April 8th, 2024, all labs will be drawn in the cancer center.  You do not have to check in or register with the main entrance as you have in the past but will complete your check-in in the cancer center.  Wear comfortable clothing and clothing appropriate for easy access to any Portacath or PICC line.   We strive to give you quality time with your provider. You may need to reschedule your appointment if you arrive late (15 or more minutes).  Arriving late affects you and other patients whose appointments are after yours.  Also, if you miss three or more appointments without notifying the office, you may be dismissed from the clinic at the provider's discretion.      For prescription refill requests, have your pharmacy contact our office and allow 72 hours for refills to be completed.    Today you received the following chemotherapy and/or immunotherapy agents Velcade only   To help prevent nausea and vomiting after your treatment, we encourage you to take your nausea medication as directed.  BELOW ARE SYMPTOMS THAT SHOULD BE REPORTED IMMEDIATELY: *FEVER GREATER THAN 100.4 F (38 C) OR HIGHER *CHILLS OR SWEATING *NAUSEA AND VOMITING THAT IS NOT CONTROLLED WITH YOUR NAUSEA MEDICATION *UNUSUAL SHORTNESS OF BREATH *UNUSUAL BRUISING OR BLEEDING *URINARY PROBLEMS (pain or burning when urinating, or frequent urination) *BOWEL PROBLEMS (unusual diarrhea, constipation, pain near the anus) TENDERNESS IN MOUTH AND THROAT WITH OR WITHOUT PRESENCE OF ULCERS (sore throat, sores in mouth, or a toothache) UNUSUAL RASH, SWELLING OR PAIN  UNUSUAL VAGINAL DISCHARGE OR ITCHING   Items with * indicate a potential emergency and should be followed up as soon as possible or go to the  Emergency Department if any problems should occur.  Please show the CHEMOTHERAPY ALERT CARD or IMMUNOTHERAPY ALERT CARD at check-in to the Emergency Department and triage nurse.  Should you have questions after your visit or need to cancel or reschedule your appointment, please contact [facility] AT [facility] [phone]  and follow the prompts.  Office hours are 8:00 a.m. to 4:30 p.m. Monday - Friday. Please note that voicemails left after 4:00 p.m. may not be returned until the following business day.  We are closed weekends and major holidays. You have access to a nurse at all times for urgent questions. Please call the main number to the clinic [phone] and follow the prompts.  For any non-urgent questions, you may also contact your provider using MyChart. We now offer e-Visits for anyone 30 and older to request care online for non-urgent symptoms. For details visit [email or web address].   Also download the MyChart app! Go to the app store, search "MyChart", open the app, select [place], and log in with your MyChart username and password.

## 2022-12-17 NOTE — Progress Notes (Signed)
Labs reviewed today. They meet parameters for treatment today . Will proceed as planned. Patient took her dexamethasone while waiting in the waiting room about 1200 today   Treatment given per orders. Patient tolerated it well without problems. Vitals stable and discharged home from clinic ambulatory. Follow up as scheduled.

## 2022-12-18 LAB — KAPPA/LAMBDA LIGHT CHAINS
Kappa free light chain: 3.7 mg/L (ref 3.3–19.4)
Kappa, lambda light chain ratio: 0.37 (ref 0.26–1.65)
Lambda free light chains: 10.1 mg/L (ref 5.7–26.3)

## 2022-12-19 LAB — PROTEIN ELECTROPHORESIS, SERUM
A/G Ratio: 1.4 (ref 0.7–1.7)
Albumin ELP: 3.6 g/dL (ref 2.9–4.4)
Alpha-1-Globulin: 0.3 g/dL (ref 0.0–0.4)
Alpha-2-Globulin: 0.9 g/dL (ref 0.4–1.0)
Beta Globulin: 0.9 g/dL (ref 0.7–1.3)
Gamma Globulin: 0.5 g/dL (ref 0.4–1.8)
Globulin, Total: 2.6 g/dL (ref 2.2–3.9)
M-Spike, %: 0.1 g/dL — ABNORMAL HIGH
Total Protein ELP: 6.2 g/dL (ref 6.0–8.5)

## 2022-12-20 ENCOUNTER — Inpatient Hospital Stay: Payer: 59 | Attending: Hematology

## 2022-12-20 ENCOUNTER — Other Ambulatory Visit: Payer: Self-pay

## 2022-12-20 VITALS — BP 138/83 | HR 75 | Temp 96.9°F | Resp 18

## 2022-12-20 DIAGNOSIS — Z7901 Long term (current) use of anticoagulants: Secondary | ICD-10-CM | POA: Insufficient documentation

## 2022-12-20 DIAGNOSIS — Z86711 Personal history of pulmonary embolism: Secondary | ICD-10-CM | POA: Insufficient documentation

## 2022-12-20 DIAGNOSIS — Z5112 Encounter for antineoplastic immunotherapy: Secondary | ICD-10-CM | POA: Diagnosis present

## 2022-12-20 DIAGNOSIS — C9000 Multiple myeloma not having achieved remission: Secondary | ICD-10-CM | POA: Insufficient documentation

## 2022-12-20 DIAGNOSIS — F419 Anxiety disorder, unspecified: Secondary | ICD-10-CM | POA: Insufficient documentation

## 2022-12-20 DIAGNOSIS — C9002 Multiple myeloma in relapse: Secondary | ICD-10-CM | POA: Diagnosis not present

## 2022-12-20 MED ORDER — BORTEZOMIB CHEMO SQ INJECTION 3.5 MG (2.5MG/ML)
1.0000 mg/m2 | Freq: Once | INTRAMUSCULAR | Status: AC
  Administered 2022-12-20: 2 mg via SUBCUTANEOUS
  Filled 2022-12-20: qty 0.8

## 2022-12-20 MED ORDER — HYDROCODONE-ACETAMINOPHEN 10-325 MG PO TABS
1.0000 | ORAL_TABLET | Freq: Four times a day (QID) | ORAL | 0 refills | Status: DC | PRN
Start: 2022-12-20 — End: 2023-01-22

## 2022-12-20 NOTE — Patient Instructions (Signed)
[facility]  Discharge Instructions: Thank you for choosing [facility] to provide your oncology and hematology care.  If you have a lab appointment with the Cancer Center - please note that after April 8th, 2024, all labs will be drawn in the cancer center.  You do not have to check in or register with the main entrance as you have in the past but will complete your check-in in the cancer center.  Wear comfortable clothing and clothing appropriate for easy access to any Portacath or PICC line.   We strive to give you quality time with your provider. You may need to reschedule your appointment if you arrive late (15 or more minutes).  Arriving late affects you and other patients whose appointments are after yours.  Also, if you miss three or more appointments without notifying the office, you may be dismissed from the clinic at the provider's discretion.      For prescription refill requests, have your pharmacy contact our office and allow 72 hours for refills to be completed.    Today you received the following chemotherapy and/or immunotherapy agents Velcade. Bortezomib Injection What is this medication? BORTEZOMIB (bor TEZ oh mib) treats lymphoma. It may also be used to treat multiple myeloma, a type of bone marrow cancer. It works by blocking a protein that causes cancer cells to grow and multiply. This helps to slow or stop the spread of cancer cells. This medicine may be used for other purposes; ask your health care provider or pharmacist if you have questions. COMMON BRAND NAME(S): Velcade What should I tell my care team before I take this medication? They need to know if you have any of these conditions: Dehydration Diabetes Heart disease Liver disease Tingling of the fingers or toes or other nerve disorder An unusual or allergic reaction to bortezomib, other medications, foods, dyes, or preservatives If you or your partner are pregnant or trying to  get pregnant Breastfeeding How should I use this medication? This medication is injected into a vein or under the skin. It is given by your care team in a hospital or clinic setting. Talk to your care team about the use of this medication in children. Special care may be needed. Overdosage: If you think you have taken too much of this medicine contact a poison control center or emergency room at once. NOTE: This medicine is only for you. Do not share this medicine with others. What if I miss a dose? Keep appointments for follow-up doses. It is important not to miss your dose. Call your care team if you are unable to keep an appointment. What may interact with this medication? Ketoconazole Rifampin This list may not describe all possible interactions. Give your health care provider a list of all the medicines, herbs, non-prescription drugs, or dietary supplements you use. Also tell them if you smoke, drink alcohol, or use illegal drugs. Some items may interact with your medicine. What should I watch for while using this medication? Your condition will be monitored carefully while you are receiving this medication. You may need blood work while taking this medication. This medication may affect your coordination, reaction time, or judgment. Do not drive or operate machinery until you know how this medication affects you. Sit up or stand slowly to reduce the risk of dizzy or fainting spells. Drinking alcohol with this medication can increase the risk of these side effects. This medication may increase your risk of getting an infection. Call   your care team for advice if you get a fever, chills, sore throat, or other symptoms of a cold or flu. Do not treat yourself. Try to avoid being around people who are sick. Check with your care team if you have severe diarrhea, nausea, and vomiting, or if you sweat a lot. The loss of too much body fluid may make it dangerous for you to take this medication. Talk to  your care team if you may be pregnant. Serious birth defects can occur if you take this medication during pregnancy and for 7 months after the last dose. You will need a negative pregnancy test before starting this medication. Contraception is recommended while taking this medication and for 7 months after the last dose. Your care team can help you find the option that works for you. If your partner can get pregnant, use a condom during sex while taking this medication and for 4 months after the last dose. Do not breastfeed while taking this medication and for 2 months after the last dose. This medication may cause infertility. Talk to your care team if you are concerned about your fertility. What side effects may I notice from receiving this medication? Side effects that you should report to your care team as soon as possible: Allergic reactions--skin rash, itching, hives, swelling of the face, lips, tongue, or throat Bleeding--bloody or black, tar-like stools, vomiting blood or brown material that looks like coffee grounds, red or dark brown urine, small red or purple spots on skin, unusual bruising or bleeding Bleeding in the brain--severe headache, stiff neck, confusion, dizziness, change in vision, numbness or weakness of the face, arm, or leg, trouble speaking, trouble walking, vomiting Bowel blockage--stomach cramping, unable to have a bowel movement or pass gas, loss of appetite, vomiting Heart failure--shortness of breath, swelling of the ankles, feet, or hands, sudden weight gain, unusual weakness or fatigue Infection--fever, chills, cough, sore throat, wounds that don't heal, pain or trouble when passing urine, general feeling of discomfort or being unwell Liver injury--right upper belly pain, loss of appetite, nausea, light-colored stool, dark yellow or brown urine, yellowing skin or eyes, unusual weakness or fatigue Low blood pressure--dizziness, feeling faint or lightheaded, blurry  vision Lung injury--shortness of breath or trouble breathing, cough, spitting up blood, chest pain, fever Pain, tingling, or numbness in the hands or feet Severe or prolonged diarrhea Stomach pain, bloody diarrhea, pale skin, unusual weakness or fatigue, decrease in the amount of urine, which may be signs of hemolytic uremic syndrome Sudden and severe headache, confusion, change in vision, seizures, which may be signs of posterior reversible encephalopathy syndrome (PRES) TTP--purple spots on the skin or inside the mouth, pale skin, yellowing skin or eyes, unusual weakness or fatigue, fever, fast or irregular heartbeat, confusion, change in vision, trouble speaking, trouble walking Tumor lysis syndrome (TLS)--nausea, vomiting, diarrhea, decrease in the amount of urine, dark urine, unusual weakness or fatigue, confusion, muscle pain or cramps, fast or irregular heartbeat, joint pain Side effects that usually do not require medical attention (report to your care team if they continue or are bothersome): Constipation Diarrhea Fatigue Loss of appetite Nausea This list may not describe all possible side effects. Call your doctor for medical advice about side effects. You may report side effects to FDA at 1-800-FDA-1088. Where should I keep my medication? This medication is given in a hospital or clinic. It will not be stored at home. NOTE: This sheet is a summary. It may not cover all possible information. If   you have questions about this medicine, talk to your doctor, pharmacist, or health care provider.  2023 Elsevier/Gold Standard (2022-01-03 00:00:00)       To help prevent nausea and vomiting after your treatment, we encourage you to take your nausea medication as directed.  BELOW ARE SYMPTOMS THAT SHOULD BE REPORTED IMMEDIATELY: *FEVER GREATER THAN 100.4 F (38 C) OR HIGHER *CHILLS OR SWEATING *NAUSEA AND VOMITING THAT IS NOT CONTROLLED WITH YOUR NAUSEA MEDICATION *UNUSUAL SHORTNESS OF  BREATH *UNUSUAL BRUISING OR BLEEDING *URINARY PROBLEMS (pain or burning when urinating, or frequent urination) *BOWEL PROBLEMS (unusual diarrhea, constipation, pain near the anus) TENDERNESS IN MOUTH AND THROAT WITH OR WITHOUT PRESENCE OF ULCERS (sore throat, sores in mouth, or a toothache) UNUSUAL RASH, SWELLING OR PAIN  UNUSUAL VAGINAL DISCHARGE OR ITCHING   Items with * indicate a potential emergency and should be followed up as soon as possible or go to the Emergency Department if any problems should occur.  Please show the CHEMOTHERAPY ALERT CARD or IMMUNOTHERAPY ALERT CARD at check-in to the Emergency Department and triage nurse.  Should you have questions after your visit or need to cancel or reschedule your appointment, please contact [facility] [phone]  and follow the prompts.  Office hours are 8:00 a.m. to 4:30 p.m. Monday - Friday. Please note that voicemails left after 4:00 p.m. may not be returned until the following business day.  We are closed weekends and major holidays. You have access to a nurse at all times for urgent questions. Please call the main number to the clinic [phone] and follow the prompts.  For any non-urgent questions, you may also contact your provider using MyChart. We now offer e-Visits for anyone 18 and older to request care online for non-urgent symptoms. For details visit [email or web address].   Also download the MyChart app! Go to the app store, search "MyChart", open the app, select [facility], and log in with your MyChart username and password.   

## 2022-12-20 NOTE — Progress Notes (Signed)
Patient presents today for chemotherapy Velcade injection.  Patient is in satisfactory condition with no new complaints voiced.  Vital signs are stable.  Vital signs within treatment parameters.  Labs reviewed. We will proceed with treatment per MD orders.    Patient tolerated treatment well with no complaints voiced.  Patient left ambulatory in stable condition.  Vital signs stable at discharge.  Follow up as scheduled.

## 2022-12-24 ENCOUNTER — Inpatient Hospital Stay: Payer: 59

## 2022-12-24 VITALS — BP 121/73 | HR 77 | Temp 97.9°F | Resp 18 | Wt 173.0 lb

## 2022-12-24 DIAGNOSIS — Z95828 Presence of other vascular implants and grafts: Secondary | ICD-10-CM

## 2022-12-24 DIAGNOSIS — C9000 Multiple myeloma not having achieved remission: Secondary | ICD-10-CM

## 2022-12-24 DIAGNOSIS — Z5112 Encounter for antineoplastic immunotherapy: Secondary | ICD-10-CM | POA: Diagnosis not present

## 2022-12-24 LAB — CBC WITH DIFFERENTIAL/PLATELET
Abs Immature Granulocytes: 0.01 10*3/uL (ref 0.00–0.07)
Basophils Absolute: 0 10*3/uL (ref 0.0–0.1)
Basophils Relative: 1 %
Eosinophils Absolute: 0.3 10*3/uL (ref 0.0–0.5)
Eosinophils Relative: 8 %
HCT: 31.7 % — ABNORMAL LOW (ref 36.0–46.0)
Hemoglobin: 10.2 g/dL — ABNORMAL LOW (ref 12.0–15.0)
Immature Granulocytes: 0 %
Lymphocytes Relative: 11 %
Lymphs Abs: 0.4 10*3/uL — ABNORMAL LOW (ref 0.7–4.0)
MCH: 33.6 pg (ref 26.0–34.0)
MCHC: 32.2 g/dL (ref 30.0–36.0)
MCV: 104.3 fL — ABNORMAL HIGH (ref 80.0–100.0)
Monocytes Absolute: 0.4 10*3/uL (ref 0.1–1.0)
Monocytes Relative: 11 %
Neutro Abs: 2.5 10*3/uL (ref 1.7–7.7)
Neutrophils Relative %: 69 %
Platelets: 78 10*3/uL — ABNORMAL LOW (ref 150–400)
RBC: 3.04 MIL/uL — ABNORMAL LOW (ref 3.87–5.11)
RDW: 12.2 % (ref 11.5–15.5)
WBC: 3.6 10*3/uL — ABNORMAL LOW (ref 4.0–10.5)
nRBC: 0 % (ref 0.0–0.2)

## 2022-12-24 MED ORDER — HEPARIN SOD (PORK) LOCK FLUSH 100 UNIT/ML IV SOLN
500.0000 [IU] | Freq: Once | INTRAVENOUS | Status: AC
  Administered 2022-12-24: 500 [IU] via INTRAVENOUS

## 2022-12-24 MED ORDER — SODIUM CHLORIDE 0.9% FLUSH
10.0000 mL | Freq: Once | INTRAVENOUS | Status: AC
  Administered 2022-12-24: 10 mL via INTRAVENOUS

## 2022-12-24 MED ORDER — DARATUMUMAB-HYALURONIDASE-FIHJ 1800-30000 MG-UT/15ML ~~LOC~~ SOLN
1800.0000 mg | Freq: Once | SUBCUTANEOUS | Status: AC
  Administered 2022-12-24: 1800 mg via SUBCUTANEOUS
  Filled 2022-12-24: qty 15

## 2022-12-24 NOTE — Patient Instructions (Signed)
[facility]  Discharge Instructions: Thank you for choosing [facility] to provide your oncology and hematology care.  If you have a lab appointment with the Cancer Center - please note that after April 8th, 2024, all labs will be drawn in the cancer center.  You do not have to check in or register with the main entrance as you have in the past but will complete your check-in in the cancer center.  Wear comfortable clothing and clothing appropriate for easy access to any Portacath or PICC line.   We strive to give you quality time with your provider. You may need to reschedule your appointment if you arrive late (15 or more minutes).  Arriving late affects you and other patients whose appointments are after yours.  Also, if you miss three or more appointments without notifying the office, you may be dismissed from the clinic at the provider's discretion.      For prescription refill requests, have your pharmacy contact our office and allow 72 hours for refills to be completed.    Today you received the following chemotherapy and/or immunotherapy agents Darzalex Faspro.  Daratumumab Injection What is this medication? DARATUMUMAB (dar a toom ue mab) treats multiple myeloma, a type of bone marrow cancer. It works by helping your immune system slow or stop the spread of cancer cells. It is a monoclonal antibody. This medicine may be used for other purposes; ask your health care provider or pharmacist if you have questions. COMMON BRAND NAME(S): DARZALEX What should I tell my care team before I take this medication? They need to know if you have any of these conditions: Hereditary fructose intolerance Infection, such as chickenpox, herpes, hepatitis B Lung or breathing disease, such as asthma, COPD An unusual or allergic reaction to daratumumab, sorbitol, other medications, foods, dyes, or preservatives Pregnant or trying to get pregnant Breastfeeding How should  I use this medication? This medication is injected into a vein. It is given by your care team in a hospital or clinic setting. Talk to your care team about the use of this medication in children. Special care may be needed. Overdosage: If you think you have taken too much of this medicine contact a poison control center or emergency room at once. NOTE: This medicine is only for you. Do not share this medicine with others. What if I miss a dose? Keep appointments for follow-up doses. It is important not to miss your dose. Call your care team if you are unable to keep an appointment. What may interact with this medication? Interactions have not been studied. This list may not describe all possible interactions. Give your health care provider a list of all the medicines, herbs, non-prescription drugs, or dietary supplements you use. Also tell them if you smoke, drink alcohol, or use illegal drugs. Some items may interact with your medicine. What should I watch for while using this medication? Your condition will be monitored carefully while you are receiving this medication. This medication can cause serious allergic reactions. To reduce your risk, your care team may give you other medication to take before receiving this one. Be sure to follow the directions from your care team. This medication can affect the results of blood tests to match your blood type. These changes can last for up to 6 months after the final dose. Your care team will do blood tests to match your blood type before you start treatment. Tell all of your care team that   you are being treated with this medication before receiving a blood transfusion. This medication can affect the results of some tests used to determine treatment response; extra tests may be needed to evaluate response. Talk to your care team if you wish to become pregnant or think you are pregnant. This medication can cause serious birth defects if taken during  pregnancy and for 3 months after the last dose. A reliable form of contraception is recommended while taking this medication and for 3 months after the last dose. Talk to your care team about effective forms of contraception. Do not breast-feed while taking this medication. What side effects may I notice from receiving this medication? Side effects that you should report to your care team as soon as possible: Allergic reactions--skin rash, itching, hives, swelling of the face, lips, tongue, or throat Infection--fever, chills, cough, sore throat, wounds that don't heal, pain or trouble when passing urine, general feeling of discomfort or being unwell Infusion reactions--chest pain, shortness of breath or trouble breathing, feeling faint or lightheaded Unusual bruising or bleeding Side effects that usually do not require medical attention (report to your care team if they continue or are bothersome): Constipation Diarrhea Fatigue Nausea Pain, tingling, or numbness in the hands or feet Swelling of the ankles, hands, or feet This list may not describe all possible side effects. Call your doctor for medical advice about side effects. You may report side effects to FDA at 1-800-FDA-1088. Where should I keep my medication? This medication is given in a hospital or clinic. It will not be stored at home. NOTE: This sheet is a summary. It may not cover all possible information. If you have questions about this medicine, talk to your doctor, pharmacist, or health care provider.  2023 Elsevier/Gold Standard (2021-11-29 00:00:00)       To help prevent nausea and vomiting after your treatment, we encourage you to take your nausea medication as directed.  BELOW ARE SYMPTOMS THAT SHOULD BE REPORTED IMMEDIATELY: *FEVER GREATER THAN 100.4 F (38 C) OR HIGHER *CHILLS OR SWEATING *NAUSEA AND VOMITING THAT IS NOT CONTROLLED WITH YOUR NAUSEA MEDICATION *UNUSUAL SHORTNESS OF BREATH *UNUSUAL BRUISING OR  BLEEDING *URINARY PROBLEMS (pain or burning when urinating, or frequent urination) *BOWEL PROBLEMS (unusual diarrhea, constipation, pain near the anus) TENDERNESS IN MOUTH AND THROAT WITH OR WITHOUT PRESENCE OF ULCERS (sore throat, sores in mouth, or a toothache) UNUSUAL RASH, SWELLING OR PAIN  UNUSUAL VAGINAL DISCHARGE OR ITCHING   Items with * indicate a potential emergency and should be followed up as soon as possible or go to the Emergency Department if any problems should occur.  Please show the CHEMOTHERAPY ALERT CARD or IMMUNOTHERAPY ALERT CARD at check-in to the Emergency Department and triage nurse.  Should you have questions after your visit or need to cancel or reschedule your appointment, please contact [facility] [phone]  and follow the prompts.  Office hours are 8:00 a.m. to 4:30 p.m. Monday - Friday. Please note that voicemails left after 4:00 p.m. may not be returned until the following business day.  We are closed weekends and major holidays. You have access to a nurse at all times for urgent questions. Please call the main number to the clinic [phone] and follow the prompts.  For any non-urgent questions, you may also contact your provider using MyChart. We now offer e-Visits for anyone 18 and older to request care online for non-urgent symptoms. For details visit [email or web address].   Also download the MyChart   app! Go to the app store, search "MyChart", open the app, select [facility], and log in with your MyChart username and password.   

## 2022-12-24 NOTE — Progress Notes (Signed)
Patient presents today for chemotherapy injection of Darzalex Faspro. Patient is in satisfactory condition with no new complaints voiced.  Patient reports taking tylenol, benadryl, and decadron at 10:30. Vital signs are stable.  Labs reviewed and all labs platelets 78, no CMP done today, Dr Ellin Saba made aware. We will proceed with treatment per MD orders.   Patient tolerated treatment well with no complaints voiced.  Patient left ambulatory in stable condition.  Vital signs stable at discharge.  Follow up as scheduled.

## 2022-12-25 LAB — KAPPA/LAMBDA LIGHT CHAINS
Kappa free light chain: 4.7 mg/L (ref 3.3–19.4)
Kappa, lambda light chain ratio: 0.47 (ref 0.26–1.65)
Lambda free light chains: 10.1 mg/L (ref 5.7–26.3)

## 2022-12-27 LAB — PROTEIN ELECTROPHORESIS, SERUM
A/G Ratio: 1.5 (ref 0.7–1.7)
Albumin ELP: 3.4 g/dL (ref 2.9–4.4)
Alpha-1-Globulin: 0.2 g/dL (ref 0.0–0.4)
Alpha-2-Globulin: 0.8 g/dL (ref 0.4–1.0)
Beta Globulin: 0.8 g/dL (ref 0.7–1.3)
Gamma Globulin: 0.3 g/dL — ABNORMAL LOW (ref 0.4–1.8)
Globulin, Total: 2.2 g/dL (ref 2.2–3.9)
M-Spike, %: 0.1 g/dL — ABNORMAL HIGH
Total Protein ELP: 5.6 g/dL — ABNORMAL LOW (ref 6.0–8.5)

## 2022-12-30 NOTE — Progress Notes (Signed)
[facility] [address], [place] [address]    Clinic Day:  12/31/2022  Referring physician: Alvina Filbert, MD  Patient Care Team: Alvina Filbert, MD as PCP - General (Internal Medicine) Doreatha Massed, MD as Medical Oncologist (Medical Oncology) Lanelle Bal, DO as Consulting Physician (Gastroenterology)   ASSESSMENT & PLAN:   Assessment: 1.  IgG lambda plasma cell myeloma, stage II, standard risk: -4 cycles of KPD from 04/17/2018 through 08/21/2018, stem cell transplant on 10/23/2018. -PET scan on 01/21/2019 showed multiple bone lesions but without any hypermetabolic activity. -BMBX on 01/21/2019 with normocellular marrow with no increase in plasma cells.  Normal FISH.  MRD results negative. -Maintenance pomalidomide 2 mg 3 weeks on/1 week off started on 03/10/2019. -Bone marrow biopsy on 10/29/2019 shows trilineage hematopoiesis with no evidence of plasma cells.  Chromosome analysis and FISH are normal. -BM BX on 10/27/2020 at [facility] [place]-normocellular marrow with 30 to 40%, TLH.  2% of the total cells are CD 138+ plasma cells. - Pomalyst was held due to dizziness from 03/29/2021 through 04/26/2021. - MRI of the brain on 03/31/2021 did not show any evidence of intracranial lesions.  Multiple bone lesions in the skull compatible with sequela of myeloma.  - Labs at [facility] on 11/16/2021: M spike 0.24 g.  Immunofixation positive for IgG lambda. - PET scan on 12/14/2021: No evidence of FDG avid osseous or soft tissue myeloma. - She has developed left shoulder blade and right posterior rib pain for the last 3 to 4 months. - 24-hour urine on 12/14/2021: Total protein 136 mg.  Immune immunofixation positive for lambda type Bence-Jones protein. -- PET scan (05/24/2022): Single new hypermetabolic lytic lesion involving left lamina of T11.  No canal encroachment.  No other hypermetabolic bone lesions.  Stable diffuse lytic lesions throughout the axial and appendicular skeleton.  No  soft tissue lesions. - Maintenance Pomalyst discontinued on 05/29/2022 - XRT to the T11 completed on 06/20/2022 - Daratumumab, Velcade and dexamethasone started on 06/11/2022   2.  Pulmonary embolism: -CT angiogram on 04/21/2020 showed filling defect at [facility]. -VQ scan confirmed pulmonary embolism. -She is on Eliquis.   3.  Right shoulder and upper arm pain: - She received XRT to the chest from 12/26/2020 through 01/06/2021.   4.  Myeloma bone disease: - Denosumab held due to exposure of bone in the right lower jaw.  Last dose in 2020.    Plan: 1.  IgG lambda plasma cell myeloma: - She is tolerating Velcade and Darzalex reasonably well. - Myeloma labs from 12/24/2022: M spike 0.1 g and stable.  Free light chain ratio is normal. - Labs today: Creatinine 1.37, normal LFTs.  CBC grossly normal with mild anemia and leukopenia and thrombocytopenia. - She will proceed with cycle 8-day 1 today. - From cycle 9, I will change Velcade to day 1 and day 15 with Darzalex every 28 days.  RTC 6 weeks for follow-up.   2.  Back pain/left shoulder blade pain/right lateral and posterior rib pains: - Continue hydrocodone 10/325 every 6 hours as needed.   3.  Hypomagnesemia: - Continue magnesium twice daily.  Magnesium is normal.   4.  Anxiety: - Continue Xanax 0.5 mg in the morning and 0.5 mg at bedtime.   5.  Sleeping difficulty: - She has tried [place], trazodone, doxepin and Sonata.  She is back to taking Ambien.   6.  Peripheral neuropathy: - Continue gabapentin 300 mg twice daily.  7.  Pulmonary embolism: - Continue Eliquis daily.  No bleeding issues.    No orders of the defined types were placed in this encounter.     I,Katie Daubenspeck,acting as a Neurosurgeon for Doreatha Massed, MD.,have documented all relevant documentation on the behalf of Doreatha Massed, MD,as directed by  Doreatha Massed, MD while in the presence of Doreatha Massed, MD.   I, Doreatha Massed MD, have reviewed the above documentation for accuracy and completeness, and I agree with the above.   Doreatha Massed, MD   5/13/20245:46 PM  CHIEF COMPLAINT:   Diagnosis: multiple myeloma    Cancer Staging  No matching staging information was found for the patient.   Prior Therapy: 1. KPD x 4 cycles from 04/17/2018 to 08/21/2018. 2. Stem cell transplant on 10/23/2018. 3. Radiation to left and right humerus 25 Gy in 10 fractions from 08/23/2020 to 08/25/2020. 4.  Maintenance Pomalyst until 06/08/2022  Current Therapy:  Darzalex, Velcade and dexamethasone started on 06/11/2022    HISTORY OF PRESENT ILLNESS:   Oncology History  Multiple myeloma not having achieved remission (HCC)  04/07/2018 Initial Diagnosis   Multiple myeloma not having achieved remission (HCC)   06/11/2022 -  Chemotherapy   Patient is on Treatment Plan : MYELOMA RELAPSED / REFRACTORY Daratumumab SQ + Bortezomib + Dexamethasone (DaraVd) q21d / Daratumumab SQ q28d      Multiple myeloma without remission (HCC)  04/15/2018 Initial Diagnosis   Multiple myeloma without remission (HCC)   04/17/2018 - 09/05/2018 Chemotherapy   The patient had dexamethasone (DECADRON) 4 MG tablet, 1 of 1 cycle, Start date: 04/15/2018, End date: 05/26/2018 palonosetron (ALOXI) injection 0.25 mg, 0.25 mg, Intravenous,  Once, 1 of 1 cycle Administration: 0.25 mg (04/17/2018), 0.25 mg (04/24/2018), 0.25 mg (05/01/2018) cyclophosphamide (CYTOXAN) 540 mg in sodium chloride 0.9 % 250 mL chemo infusion, 300 mg/m2 = 540 mg, Intravenous,  Once, 1 of 1 cycle Administration: 540 mg (04/17/2018), 540 mg (04/24/2018), 540 mg (05/01/2018) carfilzomib (KYPROLIS) 36 mg in dextrose 5 % 50 mL chemo infusion, 20 mg/m2 = 36 mg, Intravenous, Once, 5 of 5 cycles Administration: 36 mg (04/17/2018), 36 mg (04/18/2018), 60 mg (04/24/2018), 60 mg (04/25/2018), 60 mg (05/01/2018), 60 mg (05/02/2018), 60 mg (05/15/2018), 60 mg (05/16/2018), 60 mg (06/12/2018), 60 mg  (06/13/2018), 60 mg (06/26/2018), 60 mg (06/27/2018), 60 mg (07/03/2018), 60 mg (07/04/2018), 60 mg (07/24/2018), 60 mg (07/25/2018), 60 mg (07/31/2018), 60 mg (08/01/2018), 60 mg (08/08/2018), 60 mg (08/07/2018), 60 mg (08/21/2018), 60 mg (08/22/2018), 60 mg (08/28/2018), 60 mg (08/29/2018), 60 mg (09/04/2018), 60 mg (09/05/2018)  for chemotherapy treatment.    06/11/2022 -  Chemotherapy   Patient is on Treatment Plan : MYELOMA RELAPSED / REFRACTORY Daratumumab SQ + Bortezomib + Dexamethasone (DaraVd) q21d / Daratumumab SQ q28d         INTERVAL HISTORY:   Alexandra Price is a 77 y.o. female presenting to clinic today for follow up of multiple myeloma. She was last seen by me on 12/10/22.  Today, she states that she is doing well overall. Her appetite level is at 100%. Her energy level is at 70%.  PAST MEDICAL HISTORY:   Past Medical History: Past Medical History:  Diagnosis Date   Anxiety    Chronic kidney disease    Depression    Hypertension    Multiple myeloma (HCC)    multiple myeloma   Pre-diabetes     Surgical History: Past Surgical History:  Procedure Laterality Date   ABDOMINAL HYSTERECTOMY  total   APPENDECTOMY     BALLOON DILATION N/A 05/28/2022   Procedure: BALLOON DILATION;  Surgeon: Lanelle Bal, DO;  [facility];  Service: Endoscopy;  Laterality: N/A;   BIOPSY  05/28/2022   Procedure: BIOPSY;  Surgeon: Lanelle Bal, DO;  [facility];  Service: Endoscopy;;   COLONOSCOPY WITH PROPOFOL N/A 12/25/2021   Procedure: COLONOSCOPY WITH PROPOFOL;  Surgeon: Lanelle Bal, DO;  [facility];  Service: Endoscopy;  Laterality: N/A;  2:30pm   ESOPHAGOGASTRODUODENOSCOPY (EGD) WITH PROPOFOL N/A 05/28/2022   Procedure: ESOPHAGOGASTRODUODENOSCOPY (EGD) WITH PROPOFOL;  Surgeon: Lanelle Bal, DO;  [facility];  Service: Endoscopy;  Laterality: N/A;  10:00am, asa 3   LAPAROSCOPIC APPENDECTOMY N/A 05/20/2018   Procedure: APPENDECTOMY  LAPAROSCOPIC;  Surgeon: Franky Macho, MD;  Location: [facility];  Service: General;  Laterality: N/A;   POLYPECTOMY  12/25/2021   Procedure: POLYPECTOMY;  Surgeon: Lanelle Bal, DO;  [facility];  Service: Endoscopy;;   PORTACATH PLACEMENT Right 04/14/2018   Procedure: INSERTION PORT-A-CATH;  Surgeon: Franky Macho, MD;  Location: [facility];  Service: General;  Laterality: Right;    Social History: Social History   Socioeconomic History   Marital status: Divorced    Spouse name: Not on file   Number of children: 6   Years of education: Not on file   Highest education level: Not on file  Occupational History    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  Tobacco Use   Smoking status: Never   Smokeless tobacco: Never  Vaping Use   Vaping Use: Never used  Substance and Sexual Activity   Alcohol use: Never   Drug use: Not Currently   Sexual activity: Not Currently  Other Topics Concern   Not on file  Social History Narrative   Not on file   Social Determinants of Health   Financial Resource Strain: Low Risk  (07/06/2020)   Overall Financial Resource Strain (CARDIA)    Difficulty of Paying Living Expenses: Not hard at all  Food Insecurity: No Food Insecurity (09/17/2022)   Hunger Vital Sign    Worried About Running Out of Food in the Last Year: Never true    Ran Out of Food in the Last Year: Never true  Transportation Needs: No Transportation Needs (09/17/2022)   PRAPARE - Administrator, Civil Service (Medical): No    Lack of Transportation (Non-Medical): No  Physical Activity: Inactive (07/06/2020)   Exercise Vital Sign    Days of Exercise per Week: 0 days    Minutes of Exercise per Session: 0 min  Stress: No Stress Concern Present (07/06/2020)   [employer] of Occupational Health - Occupational Stress Questionnaire    Feeling of Stress : Not at all  Social Connections: Moderately Isolated (07/06/2020)   Social Connection and Isolation Panel [NHANES]     Frequency of Communication with Friends and Family: More than three times a week    Frequency of Social Gatherings with Friends and Family: Twice a week    Attends Religious Services: 1 to 4 times per year    Active Member of [employer] or Organizations: No    Attends Banker Meetings: Never    Marital Status: Separated  Intimate Partner Violence: Not At Risk (09/17/2022)   Humiliation, Afraid, Rape, and Kick questionnaire    Fear of Current or Ex-Partner: No    Emotionally Abused: No    Physically Abused: No  Sexually Abused: No    Family History: Family History  Problem Relation Age of Onset   Heart disease Mother    Emphysema Father    Diabetes Sister    Depression Sister    Cancer Brother        liver, lung, and colon. colon cancer at age 5.   Diabetes Brother     Current Medications:  Current Outpatient Medications:    acetaminophen (TYLENOL) 325 MG tablet, Take 650 mg by mouth as needed for moderate pain (prior to infusion)., Disp: , Rfl:    acyclovir (ZOVIRAX) 400 MG tablet, Take 1 tablet (400 mg total) by mouth 2 (two) times daily., Disp: 60 tablet, Rfl: 6   albuterol (PROVENTIL) (2.5 MG/3ML) 0.083% nebulizer solution, Take 3 mLs (2.5 mg total) by nebulization every 4 (four) hours as needed for wheezing or shortness of breath., Disp: 75 mL, Rfl: 2   albuterol (VENTOLIN HFA) 108 (90 Base) MCG/ACT inhaler, Inhale 2 puffs into the lungs every 4 (four) hours as needed for wheezing or shortness of breath., Disp: 18 g, Rfl: 2   ALPRAZolam (XANAX) 0.25 MG tablet, Take one tablet (0.25 mg) by mouth in the morning as needed, and two tablets (0.5 mg) at bedtime as needed, Disp: 90 tablet, Rfl: 2   Calcium Carb-Cholecalciferol (CALCIUM 1000 + D PO), Take 1,000 mg by mouth daily., Disp: , Rfl:    chlorthalidone (HYGROTON) 25 MG tablet, Take 25 mg by mouth every morning., Disp: , Rfl:    dexAMETHasone 20 MG TABS, Take 1 tablet by mouth as directed. Take 1 tablet 30 minutes  prior to infusions, Disp: 30 tablet, Rfl: 2   diphenhydrAMINE (BENADRYL) 25 MG tablet, Take 50 mg by mouth as needed (prior to infusion)., Disp: , Rfl:    ELIQUIS 2.5 MG TABS tablet, Take 2.5 mg by mouth 2 (two) times daily., Disp: , Rfl:    escitalopram (LEXAPRO) 20 MG tablet, Take 20 mg by mouth daily., Disp: , Rfl:    gabapentin (NEURONTIN) 300 MG capsule, Take 1 capsule (300 mg total) by mouth 2 (two) times daily., Disp: 60 capsule, Rfl: 0   glipiZIDE (GLUCOTROL) 5 MG tablet, Take 1 tablet (5 mg total) by mouth daily before breakfast., Disp: 30 tablet, Rfl: 5   HYDROcodone-acetaminophen (NORCO) 10-325 MG tablet, Take 1 tablet by mouth every 6 (six) hours as needed., Disp: 120 tablet, Rfl: 0   magnesium oxide (MAG-OX) 400 (240 Mg) MG tablet, Take 1 tablet (400 mg total) by mouth 2 (two) times daily., Disp: 60 tablet, Rfl: 4   midodrine (PROAMATINE) 2.5 MG tablet, Take 1 tablet (2.5 mg total) by mouth 3 (three) times daily with meals., Disp: 90 tablet, Rfl: 1   Multiple Vitamin (MULTI VITAMIN) TABS, Take 1 tablet by mouth daily., Disp: , Rfl:    pantoprazole (PROTONIX) 40 MG tablet, Take 1 tablet (40 mg total) by mouth daily., Disp: 90 tablet, Rfl: 6   polyethylene glycol (MIRALAX / GLYCOLAX) 17 g packet, Take 17 g by mouth daily as needed for moderate constipation., Disp: 14 each, Rfl: 0   prochlorperazine (COMPAZINE) 10 MG tablet, Take 1 tablet (10 mg total) by mouth every 6 (six) hours as needed for nausea or vomiting., Disp: 30 tablet, Rfl: 0   rosuvastatin (CRESTOR) 10 MG tablet, Take 10 mg by mouth daily., Disp: , Rfl:    zaleplon (SONATA) 10 MG capsule, Take 1 capsule (10 mg total) by mouth at bedtime as needed for sleep., Disp: 30 capsule,  Rfl: 3   zolpidem (AMBIEN) 10 MG tablet, Take 10 mg by mouth at bedtime as needed., Disp: , Rfl:    Allergies: Allergies  Allergen Reactions   Ciprofloxacin Anaphylaxis   Amoxicillin Other (See Comments)    unknown   Morphine And Related Nausea And  Vomiting   Augmentin [Amoxicillin-Pot Clavulanate] Other (See Comments)    Headache, insomnia    REVIEW OF SYSTEMS:   Review of Systems  Constitutional:  Negative for chills, fatigue and fever.  HENT:   Negative for lump/mass, mouth sores, nosebleeds, sore throat and trouble swallowing.   Eyes:  Negative for eye problems.  Respiratory:  Negative for cough and shortness of breath.   Cardiovascular:  Negative for chest pain, leg swelling and palpitations.  Gastrointestinal:  Negative for abdominal pain, constipation, diarrhea, nausea and vomiting.  Genitourinary:  Negative for bladder incontinence, difficulty urinating, dysuria, frequency, hematuria and nocturia.   Musculoskeletal:  Positive for back pain. Negative for arthralgias, flank pain, myalgias and neck pain.  Skin:  Negative for itching and rash.  Neurological:  Negative for dizziness, headaches and numbness.  Hematological:  Does not bruise/bleed easily.  Psychiatric/Behavioral:  Positive for depression and sleep disturbance. Negative for suicidal ideas. The patient is nervous/anxious.   All other systems reviewed and are negative.    VITALS:   There were no vitals taken for this visit.  Wt Readings from Last 3 Encounters:  12/31/22 177 lb (80.3 kg)  12/24/22 173 lb (78.5 kg)  12/17/22 172 lb (78 kg)    There is no height or weight on file to calculate BMI.  Performance status (ECOG): 1 - Symptomatic but completely ambulatory  PHYSICAL EXAM:   Physical Exam Vitals and nursing note reviewed. Exam conducted with a chaperone present.  Constitutional:      Appearance: Normal appearance.  Cardiovascular:     Rate and Rhythm: Normal rate and regular rhythm.     Pulses: Normal pulses.     Heart sounds: Normal heart sounds.  Pulmonary:     Effort: Pulmonary effort is normal.     Breath sounds: Normal breath sounds.  Abdominal:     Palpations: Abdomen is soft. There is no hepatomegaly, splenomegaly or mass.      Tenderness: There is no abdominal tenderness.  Musculoskeletal:     Right lower leg: No edema.     Left lower leg: No edema.  Lymphadenopathy:     Cervical: No cervical adenopathy.     Right cervical: No superficial, deep or posterior cervical adenopathy.    Left cervical: No superficial, deep or posterior cervical adenopathy.     Upper Body:     Right upper body: No supraclavicular or axillary adenopathy.     Left upper body: No supraclavicular or axillary adenopathy.  Neurological:     General: No focal deficit present.     Mental Status: She is alert and oriented to person, place, and time.  Psychiatric:        Mood and Affect: Mood normal.        Behavior: Behavior normal.     LABS:      Latest Ref Rng & Units 12/31/2022    7:49 AM 12/24/2022   10:11 AM 12/17/2022   11:35 AM  CBC  WBC 4.0 - 10.5 K/uL 3.6  3.6  3.3   Hemoglobin 12.0 - 15.0 g/dL 9.9  16.1  09.6   Hematocrit 36.0 - 46.0 % 32.1  31.7  32.3   Platelets 150 -  400 K/uL 118  78  96       Latest Ref Rng & Units 12/31/2022    7:49 AM 12/17/2022   11:35 AM 12/10/2022    8:29 AM  CMP  Glucose 70 - 99 mg/dL 161  096  045   BUN 8 - 23 mg/dL 32  28  25   Creatinine 0.44 - 1.00 mg/dL 4.09  8.11  9.14   Sodium 135 - 145 mmol/L 140  135  138   Potassium 3.5 - 5.1 mmol/L 4.0  4.3  4.6   Chloride 98 - 111 mmol/L 105  100  100   CO2 22 - 32 mmol/L 28  26  29    Calcium 8.9 - 10.3 mg/dL 8.7  9.1  8.7   Total Protein 6.5 - 8.1 g/dL 5.8  6.5  6.2   Total Bilirubin 0.3 - 1.2 mg/dL 0.7  0.6  0.6   Alkaline Phos 38 - 126 U/L 49  59  60   AST 15 - 41 U/L 17  14  19    ALT 0 - 44 U/L 18  16  19       No results found for: "CEA1", "CEA" / No results found for: "CEA1", "CEA" No results found for: "PSA1" No results found for: "CAN199" No results found for: "CAN125"  Lab Results  Component Value Date   TOTALPROTELP 5.6 (L) 12/24/2022   ALBUMINELP 3.4 12/24/2022   A1GS 0.2 12/24/2022   A2GS 0.8 12/24/2022   BETS 0.8  12/24/2022   GAMS 0.3 (L) 12/24/2022   MSPIKE 0.1 (H) 12/24/2022   SPEI Comment 12/24/2022   Lab Results  Component Value Date   TIBC 290 09/17/2022   TIBC 278 07/30/2022   TIBC 272 05/24/2022   FERRITIN 1,059 (H) 09/17/2022   FERRITIN 313 (H) 07/30/2022   FERRITIN 435 (H) 05/24/2022   IRONPCTSAT 10 (L) 09/17/2022   IRONPCTSAT 21 07/30/2022   IRONPCTSAT 27 05/24/2022   Lab Results  Component Value Date   LDH 129 05/24/2022   LDH 135 03/20/2022   LDH 129 12/14/2021     STUDIES:   No results found.

## 2022-12-31 ENCOUNTER — Inpatient Hospital Stay: Payer: 59

## 2022-12-31 ENCOUNTER — Other Ambulatory Visit: Payer: 59

## 2022-12-31 ENCOUNTER — Ambulatory Visit: Payer: 59

## 2022-12-31 ENCOUNTER — Inpatient Hospital Stay (HOSPITAL_BASED_OUTPATIENT_CLINIC_OR_DEPARTMENT_OTHER): Payer: 59 | Admitting: Hematology

## 2022-12-31 VITALS — BP 123/70 | HR 75 | Temp 96.9°F | Resp 20 | Wt 177.0 lb

## 2022-12-31 DIAGNOSIS — Z5112 Encounter for antineoplastic immunotherapy: Secondary | ICD-10-CM | POA: Diagnosis not present

## 2022-12-31 DIAGNOSIS — C9000 Multiple myeloma not having achieved remission: Secondary | ICD-10-CM | POA: Diagnosis not present

## 2022-12-31 DIAGNOSIS — Z95828 Presence of other vascular implants and grafts: Secondary | ICD-10-CM

## 2022-12-31 LAB — COMPREHENSIVE METABOLIC PANEL
ALT: 18 U/L (ref 0–44)
AST: 17 U/L (ref 15–41)
Albumin: 3.6 g/dL (ref 3.5–5.0)
Alkaline Phosphatase: 49 U/L (ref 38–126)
Anion gap: 7 (ref 5–15)
BUN: 32 mg/dL — ABNORMAL HIGH (ref 8–23)
CO2: 28 mmol/L (ref 22–32)
Calcium: 8.7 mg/dL — ABNORMAL LOW (ref 8.9–10.3)
Chloride: 105 mmol/L (ref 98–111)
Creatinine, Ser: 1.37 mg/dL — ABNORMAL HIGH (ref 0.44–1.00)
GFR, Estimated: 41 mL/min — ABNORMAL LOW (ref >60)
Glucose, Bld: 125 mg/dL — ABNORMAL HIGH (ref 70–99)
Potassium: 4 mmol/L (ref 3.5–5.1)
Sodium: 140 mmol/L (ref 135–145)
Total Bilirubin: 0.7 mg/dL (ref 0.3–1.2)
Total Protein: 5.8 g/dL — ABNORMAL LOW (ref 6.5–8.1)

## 2022-12-31 LAB — CBC WITH DIFFERENTIAL/PLATELET
Abs Immature Granulocytes: 0 10*3/uL (ref 0.00–0.07)
Basophils Absolute: 0 10*3/uL (ref 0.0–0.1)
Basophils Relative: 1 %
Eosinophils Absolute: 0.2 10*3/uL (ref 0.0–0.5)
Eosinophils Relative: 4 %
HCT: 32.1 % — ABNORMAL LOW (ref 36.0–46.0)
Hemoglobin: 9.9 g/dL — ABNORMAL LOW (ref 12.0–15.0)
Immature Granulocytes: 0 %
Lymphocytes Relative: 13 %
Lymphs Abs: 0.5 10*3/uL — ABNORMAL LOW (ref 0.7–4.0)
MCH: 33.2 pg (ref 26.0–34.0)
MCHC: 30.8 g/dL (ref 30.0–36.0)
MCV: 107.7 fL — ABNORMAL HIGH (ref 80.0–100.0)
Monocytes Absolute: 0.4 10*3/uL (ref 0.1–1.0)
Monocytes Relative: 10 %
Neutro Abs: 2.6 10*3/uL (ref 1.7–7.7)
Neutrophils Relative %: 72 %
Platelets: 118 10*3/uL — ABNORMAL LOW (ref 150–400)
RBC: 2.98 MIL/uL — ABNORMAL LOW (ref 3.87–5.11)
RDW: 12.5 % (ref 11.5–15.5)
WBC: 3.6 10*3/uL — ABNORMAL LOW (ref 4.0–10.5)
nRBC: 0 % (ref 0.0–0.2)

## 2022-12-31 LAB — MAGNESIUM: Magnesium: 2.1 mg/dL (ref 1.7–2.4)

## 2022-12-31 MED ORDER — BORTEZOMIB CHEMO SQ INJECTION 3.5 MG (2.5MG/ML)
1.0000 mg/m2 | Freq: Once | INTRAMUSCULAR | Status: AC
  Administered 2022-12-31: 2 mg via SUBCUTANEOUS
  Filled 2022-12-31: qty 0.8

## 2022-12-31 MED ORDER — HEPARIN SOD (PORK) LOCK FLUSH 100 UNIT/ML IV SOLN
500.0000 [IU] | Freq: Once | INTRAVENOUS | Status: AC
  Administered 2022-12-31: 500 [IU] via INTRAVENOUS

## 2022-12-31 MED ORDER — SODIUM CHLORIDE 0.9% FLUSH
10.0000 mL | Freq: Once | INTRAVENOUS | Status: AC
  Administered 2022-12-31: 10 mL via INTRAVENOUS

## 2022-12-31 NOTE — Patient Instructions (Addendum)
[facility] at [facility] Discharge Instructions   You were seen and examined today by Dr. Ellin Saba.  He reviewed the results of your lab work which are normal/stable. Your myeloma labs are stable. Your M-spike is 0.1.   We will proceed with your treatment today.   Return as scheduled.    Thank you for choosing [facility] at [facility] to provide your oncology and hematology care.  To afford each patient quality time with our provider, please arrive at least 15 minutes before your scheduled appointment time.   If you have a lab appointment with the Cancer Center please come in thru the Main Entrance and check in at the main information desk.  You need to re-schedule your appointment should you arrive 10 or more minutes late.  We strive to give you quality time with our providers, and arriving late affects you and other patients whose appointments are after yours.  Also, if you no show three or more times for appointments you may be dismissed from the clinic at the providers discretion.     Again, thank you for choosing [facility].  Our hope is that these requests will decrease the amount of time that you wait before being seen by our physicians.       _____________________________________________________________  Should you have questions after your visit to [facility], please contact our office at [phone] and follow the prompts.  Our office hours are 8:00 a.m. and 4:30 p.m. Monday - Friday.  Please note that voicemails left after 4:00 p.m. may not be returned until the following business day.  We are closed weekends and major holidays.  You do have access to a nurse 24-7, just call the main number to the clinic [phone] and do not press any options, hold on the line and a nurse will answer the phone.    For prescription refill requests, have your pharmacy contact our office and allow 72 hours.    Due to  Covid, you will need to wear a mask upon entering the hospital. If you do not have a mask, a mask will be given to you at the Main Entrance upon arrival. For doctor visits, patients may have 1 support person age 97 or older with them. For treatment visits, patients can not have anyone with them due to social distancing guidelines and our immunocompromised population.

## 2022-12-31 NOTE — Patient Instructions (Signed)
[facility]  Discharge Instructions: Thank you for choosing [facility] to provide your oncology and hematology care.  If you have a lab appointment with the Cancer Center - please note that after April 8th, 2024, all labs will be drawn in the cancer center.  You do not have to check in or register with the main entrance as you have in the past but will complete your check-in in the cancer center.  Wear comfortable clothing and clothing appropriate for easy access to any Portacath or PICC line.   We strive to give you quality time with your provider. You may need to reschedule your appointment if you arrive late (15 or more minutes).  Arriving late affects you and other patients whose appointments are after yours.  Also, if you miss three or more appointments without notifying the office, you may be dismissed from the clinic at the provider's discretion.      For prescription refill requests, have your pharmacy contact our office and allow 72 hours for refills to be completed.    Today you received the following chemotherapy and/or immunotherapy agents Velcade      To help prevent nausea and vomiting after your treatment, we encourage you to take your nausea medication as directed.  BELOW ARE SYMPTOMS THAT SHOULD BE REPORTED IMMEDIATELY: *FEVER GREATER THAN 100.4 F (38 C) OR HIGHER *CHILLS OR SWEATING *NAUSEA AND VOMITING THAT IS NOT CONTROLLED WITH YOUR NAUSEA MEDICATION *UNUSUAL SHORTNESS OF BREATH *UNUSUAL BRUISING OR BLEEDING *URINARY PROBLEMS (pain or burning when urinating, or frequent urination) *BOWEL PROBLEMS (unusual diarrhea, constipation, pain near the anus) TENDERNESS IN MOUTH AND THROAT WITH OR WITHOUT PRESENCE OF ULCERS (sore throat, sores in mouth, or a toothache) UNUSUAL RASH, SWELLING OR PAIN  UNUSUAL VAGINAL DISCHARGE OR ITCHING   Items with * indicate a potential emergency and should be followed up as soon as possible or go to the  Emergency Department if any problems should occur.  Please show the CHEMOTHERAPY ALERT CARD or IMMUNOTHERAPY ALERT CARD at check-in to the Emergency Department and triage nurse.  Should you have questions after your visit or need to cancel or reschedule your appointment, please contact [facility] [phone]  and follow the prompts.  Office hours are 8:00 a.m. to 4:30 p.m. Monday - Friday. Please note that voicemails left after 4:00 p.m. may not be returned until the following business day.  We are closed weekends and major holidays. You have access to a nurse at all times for urgent questions. Please call the main number to the clinic [phone] and follow the prompts.  For any non-urgent questions, you may also contact your provider using MyChart. We now offer e-Visits for anyone 18 and older to request care online for non-urgent symptoms. For details visit [email or web address].   Also download the MyChart app! Go to the app store, search "MyChart", open the app, select [place], and log in with your MyChart username and password.   

## 2022-12-31 NOTE — Progress Notes (Signed)
Patient presents today for Velcade injection per providers order.  Vital signs and labs reviewed by MD.  Message received from Chapman Moss RN/Dr. Ellin Saba patient okay for treatment.  Patient took dexamethasone at home.  Stable during administration without incident; injection site WNL; see MAR for injection details.  Patient tolerated procedure well and without incident.  No questions or complaints noted at this time.

## 2022-12-31 NOTE — Progress Notes (Signed)
Patient has been examined by Dr. Katragadda. Vital signs and labs have been reviewed by MD - ANC, Creatinine, LFTs, hemoglobin, and platelets are within treatment parameters per M.D. - pt may proceed with treatment.  Primary RN and pharmacy notified.  

## 2023-01-01 ENCOUNTER — Other Ambulatory Visit: Payer: Self-pay

## 2023-01-02 ENCOUNTER — Other Ambulatory Visit: Payer: Self-pay | Admitting: *Deleted

## 2023-01-02 MED ORDER — GABAPENTIN 300 MG PO CAPS: 300.0000 mg | ORAL_CAPSULE | Freq: Two times a day (BID) | ORAL | 0 refills | Status: DC

## 2023-01-03 ENCOUNTER — Inpatient Hospital Stay: Payer: 59

## 2023-01-03 VITALS — BP 147/77 | HR 72 | Temp 97.2°F | Resp 16

## 2023-01-03 DIAGNOSIS — C9000 Multiple myeloma not having achieved remission: Secondary | ICD-10-CM

## 2023-01-03 DIAGNOSIS — Z5112 Encounter for antineoplastic immunotherapy: Secondary | ICD-10-CM | POA: Diagnosis not present

## 2023-01-03 MED ORDER — BORTEZOMIB CHEMO SQ INJECTION 3.5 MG (2.5MG/ML)
1.0000 mg/m2 | Freq: Once | INTRAMUSCULAR | Status: AC
  Administered 2023-01-03: 2 mg via SUBCUTANEOUS
  Filled 2023-01-03: qty 0.8

## 2023-01-03 NOTE — Patient Instructions (Signed)
[facility] [place]  Discharge Instructions: Thank you for choosing [facility] to provide your oncology and hematology care.  If you have a lab appointment with the Cancer Center - please note that after April 8th, 2024, all labs will be drawn in the cancer center.  You do not have to check in or register with the main entrance as you have in the past but will complete your check-in in the cancer center.  Wear comfortable clothing and clothing appropriate for easy access to any Portacath or PICC line.   We strive to give you quality time with your provider. You may need to reschedule your appointment if you arrive late (15 or more minutes).  Arriving late affects you and other patients whose appointments are after yours.  Also, if you miss three or more appointments without notifying the office, you may be dismissed from the clinic at the provider's discretion.      For prescription refill requests, have your pharmacy contact our office and allow 72 hours for refills to be completed.    Today you received the following chemotherapy and/or immunotherapy agents Velcade   To help prevent nausea and vomiting after your treatment, we encourage you to take your nausea medication as directed.  Bortezomib Injection What is this medication? BORTEZOMIB (bor TEZ oh mib) treats lymphoma. It may also be used to treat multiple myeloma, a type of bone marrow cancer. It works by blocking a protein that causes cancer cells to grow and multiply. This helps to slow or stop the spread of cancer cells. This medicine may be used for other purposes; ask your health care provider or pharmacist if you have questions. COMMON BRAND NAME(S): Velcade What should I tell my care team before I take this medication? They need to know if you have any of these conditions: Dehydration Diabetes Heart disease Liver disease Tingling of the fingers or toes or other nerve disorder An unusual or  allergic reaction to bortezomib, other medications, foods, dyes, or preservatives If you or your partner are pregnant or trying to get pregnant Breastfeeding How should I use this medication? This medication is injected into a vein or under the skin. It is given by your care team in a hospital or clinic setting. Talk to your care team about the use of this medication in children. Special care may be needed. Overdosage: If you think you have taken too much of this medicine contact a poison control center or emergency room at once. NOTE: This medicine is only for you. Do not share this medicine with others. What if I miss a dose? Keep appointments for follow-up doses. It is important not to miss your dose. Call your care team if you are unable to keep an appointment. What may interact with this medication? Ketoconazole Rifampin This list may not describe all possible interactions. Give your health care provider a list of all the medicines, herbs, non-prescription drugs, or dietary supplements you use. Also tell them if you smoke, drink alcohol, or use illegal drugs. Some items may interact with your medicine. What should I watch for while using this medication? Your condition will be monitored carefully while you are receiving this medication. You may need blood work while taking this medication. This medication may affect your coordination, reaction time, or judgment. Do not drive or operate machinery until you know how this medication affects you. Sit up or stand slowly to reduce the risk of dizzy or fainting spells. Drinking alcohol   with this medication can increase the risk of these side effects. This medication may increase your risk of getting an infection. Call your care team for advice if you get a fever, chills, sore throat, or other symptoms of a cold or flu. Do not treat yourself. Try to avoid being around people who are sick. Check with your care team if you have severe diarrhea, nausea,  and vomiting, or if you sweat a lot. The loss of too much body fluid may make it dangerous for you to take this medication. Talk to your care team if you may be pregnant. Serious birth defects can occur if you take this medication during pregnancy and for 7 months after the last dose. You will need a negative pregnancy test before starting this medication. Contraception is recommended while taking this medication and for 7 months after the last dose. Your care team can help you find the option that works for you. If your partner can get pregnant, use a condom during sex while taking this medication and for 4 months after the last dose. Do not breastfeed while taking this medication and for 2 months after the last dose. This medication may cause infertility. Talk to your care team if you are concerned about your fertility. What side effects may I notice from receiving this medication? Side effects that you should report to your care team as soon as possible: Allergic reactions--skin rash, itching, hives, swelling of the face, lips, tongue, or throat Bleeding--bloody or black, tar-like stools, vomiting blood or brown material that looks like coffee grounds, red or dark brown urine, small red or purple spots on skin, unusual bruising or bleeding Bleeding in the brain--severe headache, stiff neck, confusion, dizziness, change in vision, numbness or weakness of the face, arm, or leg, trouble speaking, trouble walking, vomiting Bowel blockage--stomach cramping, unable to have a bowel movement or pass gas, loss of appetite, vomiting Heart failure--shortness of breath, swelling of the ankles, feet, or hands, sudden weight gain, unusual weakness or fatigue Infection--fever, chills, cough, sore throat, wounds that don't heal, pain or trouble when passing urine, general feeling of discomfort or being unwell Liver injury--right upper belly pain, loss of appetite, nausea, light-colored stool, dark yellow or brown  urine, yellowing skin or eyes, unusual weakness or fatigue Low blood pressure--dizziness, feeling faint or lightheaded, blurry vision Lung injury--shortness of breath or trouble breathing, cough, spitting up blood, chest pain, fever Pain, tingling, or numbness in the hands or feet Severe or prolonged diarrhea Stomach pain, bloody diarrhea, pale skin, unusual weakness or fatigue, decrease in the amount of urine, which may be signs of hemolytic uremic syndrome Sudden and severe headache, confusion, change in vision, seizures, which may be signs of posterior reversible encephalopathy syndrome (PRES) TTP--purple spots on the skin or inside the mouth, pale skin, yellowing skin or eyes, unusual weakness or fatigue, fever, fast or irregular heartbeat, confusion, change in vision, trouble speaking, trouble walking Tumor lysis syndrome (TLS)--nausea, vomiting, diarrhea, decrease in the amount of urine, dark urine, unusual weakness or fatigue, confusion, muscle pain or cramps, fast or irregular heartbeat, joint pain Side effects that usually do not require medical attention (report to your care team if they continue or are bothersome): Constipation Diarrhea Fatigue Loss of appetite Nausea This list may not describe all possible side effects. Call your doctor for medical advice about side effects. You may report side effects to FDA at 1-800-FDA-1088. Where should I keep my medication? This medication is given in a hospital or   clinic. It will not be stored at home. NOTE: This sheet is a summary. It may not cover all possible information. If you have questions about this medicine, talk to your doctor, pharmacist, or health care provider.  2023 Elsevier/Gold Standard (2022-01-03 00:00:00)  BELOW ARE SYMPTOMS THAT SHOULD BE REPORTED IMMEDIATELY: *FEVER GREATER THAN 100.4 F (38 C) OR HIGHER *CHILLS OR SWEATING *NAUSEA AND VOMITING THAT IS NOT CONTROLLED WITH YOUR NAUSEA MEDICATION *UNUSUAL SHORTNESS OF  BREATH *UNUSUAL BRUISING OR BLEEDING *URINARY PROBLEMS (pain or burning when urinating, or frequent urination) *BOWEL PROBLEMS (unusual diarrhea, constipation, pain near the anus) TENDERNESS IN MOUTH AND THROAT WITH OR WITHOUT PRESENCE OF ULCERS (sore throat, sores in mouth, or a toothache) UNUSUAL RASH, SWELLING OR PAIN  UNUSUAL VAGINAL DISCHARGE OR ITCHING   Items with * indicate a potential emergency and should be followed up as soon as possible or go to the Emergency Department if any problems should occur.  Please show the CHEMOTHERAPY ALERT CARD or IMMUNOTHERAPY ALERT CARD at check-in to the Emergency Department and triage nurse.  Should you have questions after your visit or need to cancel or reschedule your appointment, please contact [facility] [phone]  and follow the prompts.  Office hours are 8:00 a.m. to 4:30 p.m. Monday - Friday. Please note that voicemails left after 4:00 p.m. may not be returned until the following business day.  We are closed weekends and major holidays. You have access to a nurse at all times for urgent questions. Please call the main number to the clinic [phone] and follow the prompts.  For any non-urgent questions, you may also contact your provider using MyChart. We now offer e-Visits for anyone 18 and older to request care online for non-urgent symptoms. For details visit [email or web address].   Also download the MyChart app! Go to the app store, search "MyChart", open the app, select [facility], and log in with your MyChart username and password.   

## 2023-01-03 NOTE — Progress Notes (Signed)
Patient presents today for Velcade infusion. Patient is in satisfactory condition with no new complaints voiced.  Vital signs are stable.  Labs reviewed on 12/31/22 and all labs are within treatment parameters.  We will proceed with treatment per MD orders.    Velcade given today per MD orders. Tolerated infusion without adverse affects. Vital signs stable. No complaints at this time. Discharged from clinic ambulatory in stable condition. Alert and oriented x 3. F/U with [facility] as scheduled.

## 2023-01-07 ENCOUNTER — Inpatient Hospital Stay: Payer: 59

## 2023-01-07 VITALS — BP 128/59 | HR 73 | Temp 97.4°F | Resp 18 | Wt 177.2 lb

## 2023-01-07 DIAGNOSIS — C9000 Multiple myeloma not having achieved remission: Secondary | ICD-10-CM

## 2023-01-07 DIAGNOSIS — Z5112 Encounter for antineoplastic immunotherapy: Secondary | ICD-10-CM | POA: Diagnosis not present

## 2023-01-07 LAB — CBC WITH DIFFERENTIAL/PLATELET
Abs Immature Granulocytes: 0 10*3/uL (ref 0.00–0.07)
Basophils Absolute: 0 10*3/uL (ref 0.0–0.1)
Basophils Relative: 1 %
Eosinophils Absolute: 0.2 10*3/uL (ref 0.0–0.5)
Eosinophils Relative: 4 %
HCT: 31.9 % — ABNORMAL LOW (ref 36.0–46.0)
Hemoglobin: 10.1 g/dL — ABNORMAL LOW (ref 12.0–15.0)
Immature Granulocytes: 0 %
Lymphocytes Relative: 13 %
Lymphs Abs: 0.5 10*3/uL — ABNORMAL LOW (ref 0.7–4.0)
MCH: 33.6 pg (ref 26.0–34.0)
MCHC: 31.7 g/dL (ref 30.0–36.0)
MCV: 106 fL — ABNORMAL HIGH (ref 80.0–100.0)
Monocytes Absolute: 0.4 10*3/uL (ref 0.1–1.0)
Monocytes Relative: 11 %
Neutro Abs: 2.4 10*3/uL (ref 1.7–7.7)
Neutrophils Relative %: 71 %
Platelets: 102 10*3/uL — ABNORMAL LOW (ref 150–400)
RBC: 3.01 MIL/uL — ABNORMAL LOW (ref 3.87–5.11)
RDW: 12.6 % (ref 11.5–15.5)
WBC: 3.5 10*3/uL — ABNORMAL LOW (ref 4.0–10.5)
nRBC: 0 % (ref 0.0–0.2)

## 2023-01-07 LAB — COMPREHENSIVE METABOLIC PANEL
ALT: 22 U/L (ref 0–44)
AST: 17 U/L (ref 15–41)
Albumin: 3.8 g/dL (ref 3.5–5.0)
Alkaline Phosphatase: 50 U/L (ref 38–126)
Anion gap: 7 (ref 5–15)
BUN: 28 mg/dL — ABNORMAL HIGH (ref 8–23)
CO2: 28 mmol/L (ref 22–32)
Calcium: 8.8 mg/dL — ABNORMAL LOW (ref 8.9–10.3)
Chloride: 103 mmol/L (ref 98–111)
Creatinine, Ser: 1.42 mg/dL — ABNORMAL HIGH (ref 0.44–1.00)
GFR, Estimated: 39 mL/min — ABNORMAL LOW (ref >60)
Glucose, Bld: 114 mg/dL — ABNORMAL HIGH (ref 70–99)
Potassium: 4.6 mmol/L (ref 3.5–5.1)
Sodium: 138 mmol/L (ref 135–145)
Total Bilirubin: 0.8 mg/dL (ref 0.3–1.2)
Total Protein: 6.2 g/dL — ABNORMAL LOW (ref 6.5–8.1)

## 2023-01-07 MED ORDER — BORTEZOMIB CHEMO SQ INJECTION 3.5 MG (2.5MG/ML)
1.0000 mg/m2 | Freq: Once | INTRAMUSCULAR | Status: AC
  Administered 2023-01-07: 2 mg via SUBCUTANEOUS
  Filled 2023-01-07: qty 0.8

## 2023-01-07 MED ORDER — SODIUM CHLORIDE 0.9% FLUSH
10.0000 mL | Freq: Once | INTRAVENOUS | Status: AC | PRN
  Administered 2023-01-07: 10 mL

## 2023-01-07 MED ORDER — HEPARIN SOD (PORK) LOCK FLUSH 100 UNIT/ML IV SOLN
500.0000 [IU] | Freq: Once | INTRAVENOUS | Status: AC | PRN
  Administered 2023-01-07: 500 [IU]

## 2023-01-07 MED ORDER — DARATUMUMAB-HYALURONIDASE-FIHJ 1800-30000 MG-UT/15ML ~~LOC~~ SOLN
1800.0000 mg | Freq: Once | SUBCUTANEOUS | Status: AC
  Administered 2023-01-07: 1800 mg via SUBCUTANEOUS
  Filled 2023-01-07: qty 15

## 2023-01-07 NOTE — Patient Instructions (Signed)
[facility]  Discharge Instructions: Thank you for choosing [facility] to provide your oncology and hematology care.  If you have a lab appointment with the Cancer Center - please note that after April 8th, 2024, all labs will be drawn in the cancer center.  You do not have to check in or register with the main entrance as you have in the past but will complete your check-in in the cancer center.  Wear comfortable clothing and clothing appropriate for easy access to any Portacath or PICC line.   We strive to give you quality time with your provider. You may need to reschedule your appointment if you arrive late (15 or more minutes).  Arriving late affects you and other patients whose appointments are after yours.  Also, if you miss three or more appointments without notifying the office, you may be dismissed from the clinic at the provider's discretion.      For prescription refill requests, have your pharmacy contact our office and allow 72 hours for refills to be completed.    Today you received the following chemotherapy and/or immunotherapy agents dara and Velcade   To help prevent nausea and vomiting after your treatment, we encourage you to take your nausea medication as directed.  Bortezomib Injection What is this medication? BORTEZOMIB (bor TEZ oh mib) treats lymphoma. It may also be used to treat multiple myeloma, a type of bone marrow cancer. It works by blocking a protein that causes cancer cells to grow and multiply. This helps to slow or stop the spread of cancer cells. This medicine may be used for other purposes; ask your health care provider or pharmacist if you have questions. COMMON BRAND NAME(S): Velcade What should I tell my care team before I take this medication? They need to know if you have any of these conditions: Dehydration Diabetes Heart disease Liver disease Tingling of the fingers or toes or other nerve disorder An unusual  or allergic reaction to bortezomib, other medications, foods, dyes, or preservatives If you or your partner are pregnant or trying to get pregnant Breastfeeding How should I use this medication? This medication is injected into a vein or under the skin. It is given by your care team in a hospital or clinic setting. Talk to your care team about the use of this medication in children. Special care may be needed. Overdosage: If you think you have taken too much of this medicine contact a poison control center or emergency room at once. NOTE: This medicine is only for you. Do not share this medicine with others. What if I miss a dose? Keep appointments for follow-up doses. It is important not to miss your dose. Call your care team if you are unable to keep an appointment. What may interact with this medication? Ketoconazole Rifampin This list may not describe all possible interactions. Give your health care provider a list of all the medicines, herbs, non-prescription drugs, or dietary supplements you use. Also tell them if you smoke, drink alcohol, or use illegal drugs. Some items may interact with your medicine. What should I watch for while using this medication? Your condition will be monitored carefully while you are receiving this medication. You may need blood work while taking this medication. This medication may affect your coordination, reaction time, or judgment. Do not drive or operate machinery until you know how this medication affects you. Sit up or stand slowly to reduce the risk of dizzy or fainting spells.  Drinking alcohol with this medication can increase the risk of these side effects. This medication may increase your risk of getting an infection. Call your care team for advice if you get a fever, chills, sore throat, or other symptoms of a cold or flu. Do not treat yourself. Try to avoid being around people who are sick. Check with your care team if you have severe diarrhea,  nausea, and vomiting, or if you sweat a lot. The loss of too much body fluid may make it dangerous for you to take this medication. Talk to your care team if you may be pregnant. Serious birth defects can occur if you take this medication during pregnancy and for 7 months after the last dose. You will need a negative pregnancy test before starting this medication. Contraception is recommended while taking this medication and for 7 months after the last dose. Your care team can help you find the option that works for you. If your partner can get pregnant, use a condom during sex while taking this medication and for 4 months after the last dose. Do not breastfeed while taking this medication and for 2 months after the last dose. This medication may cause infertility. Talk to your care team if you are concerned about your fertility. What side effects may I notice from receiving this medication? Side effects that you should report to your care team as soon as possible: Allergic reactions--skin rash, itching, hives, swelling of the face, lips, tongue, or throat Bleeding--bloody or black, tar-like stools, vomiting blood or brown material that looks like coffee grounds, red or dark brown urine, small red or purple spots on skin, unusual bruising or bleeding Bleeding in the brain--severe headache, stiff neck, confusion, dizziness, change in vision, numbness or weakness of the face, arm, or leg, trouble speaking, trouble walking, vomiting Bowel blockage--stomach cramping, unable to have a bowel movement or pass gas, loss of appetite, vomiting Heart failure--shortness of breath, swelling of the ankles, feet, or hands, sudden weight gain, unusual weakness or fatigue Infection--fever, chills, cough, sore throat, wounds that don't heal, pain or trouble when passing urine, general feeling of discomfort or being unwell Liver injury--right upper belly pain, loss of appetite, nausea, light-colored stool, dark yellow or  brown urine, yellowing skin or eyes, unusual weakness or fatigue Low blood pressure--dizziness, feeling faint or lightheaded, blurry vision Lung injury--shortness of breath or trouble breathing, cough, spitting up blood, chest pain, fever Pain, tingling, or numbness in the hands or feet Severe or prolonged diarrhea Stomach pain, bloody diarrhea, pale skin, unusual weakness or fatigue, decrease in the amount of urine, which may be signs of hemolytic uremic syndrome Sudden and severe headache, confusion, change in vision, seizures, which may be signs of posterior reversible encephalopathy syndrome (PRES) TTP--purple spots on the skin or inside the mouth, pale skin, yellowing skin or eyes, unusual weakness or fatigue, fever, fast or irregular heartbeat, confusion, change in vision, trouble speaking, trouble walking Tumor lysis syndrome (TLS)--nausea, vomiting, diarrhea, decrease in the amount of urine, dark urine, unusual weakness or fatigue, confusion, muscle pain or cramps, fast or irregular heartbeat, joint pain Side effects that usually do not require medical attention (report to your care team if they continue or are bothersome): Constipation Diarrhea Fatigue Loss of appetite Nausea This list may not describe all possible side effects. Call your doctor for medical advice about side effects. You may report side effects to FDA at 1-800-FDA-1088. Where should I keep my medication? This medication is given in a  hospital or clinic. It will not be stored at home. NOTE: This sheet is a summary. It may not cover all possible information. If you have questions about this medicine, talk to your doctor, pharmacist, or health care provider.  2023 Elsevier/Gold Standard (2022-01-03 00:00:00) Daratumumab; Hyaluronidase Injection What is this medication? DARATUMUMAB; HYALURONIDASE (dar a toom ue mab; hye al ur ON i dase) treats multiple myeloma, a type of bone marrow cancer. Daratumumab works by blocking a  protein that causes cancer cells to grow and multiply. This helps to slow or stop the spread of cancer cells. Hyaluronidase works by increasing the absorption of other medications in the body to help them work better. This medication may also be used treat amyloidosis, a condition that causes the buildup of a protein (amyloid) in your body. It works by reducing the buildup of this protein, which decreases symptoms. It is a combination medication that contains a monoclonal antibody. This medicine may be used for other purposes; ask your health care provider or pharmacist if you have questions. COMMON BRAND NAME(S): DARZALEX FASPRO What should I tell my care team before I take this medication? They need to know if you have any of these conditions: Heart disease Infection, such as chickenpox, cold sores, herpes, hepatitis B Lung or breathing disease An unusual or allergic reaction to daratumumab, hyaluronidase, other medications, foods, dyes, or preservatives Pregnant or trying to get pregnant Breast-feeding How should I use this medication? This medication is injected under the skin. It is given by your care team in a hospital or clinic setting. Talk to your care team about the use of this medication in children. Special care may be needed. Overdosage: If you think you have taken too much of this medicine contact a poison control center or emergency room at once. NOTE: This medicine is only for you. Do not share this medicine with others. What if I miss a dose? Keep appointments for follow-up doses. It is important not to miss your dose. Call your care team if you are unable to keep an appointment. What may interact with this medication? Interactions have not been studied. This list may not describe all possible interactions. Give your health care provider a list of all the medicines, herbs, non-prescription drugs, or dietary supplements you use. Also tell them if you smoke, drink alcohol, or use  illegal drugs. Some items may interact with your medicine. What should I watch for while using this medication? Your condition will be monitored carefully while you are receiving this medication. This medication can cause serious allergic reactions. To reduce your risk, your care team may give you other medication to take before receiving this one. Be sure to follow the directions from your care team. This medication can affect the results of blood tests to match your blood type. These changes can last for up to 6 months after the final dose. Your care team will do blood tests to match your blood type before you start treatment. Tell all of your care team that you are being treated with this medication before receiving a blood transfusion. This medication can affect the results of some tests used to determine treatment response; extra tests may be needed to evaluate response. Talk to your care team if you wish to become pregnant or think you are pregnant. This medication can cause serious birth defects if taken during pregnancy and for 3 months after the last dose. A reliable form of contraception is recommended while taking this medication and for  3 months after the last dose. Talk to your care team about effective forms of contraception. Do not breast-feed while taking this medication. What side effects may I notice from receiving this medication? Side effects that you should report to your care team as soon as possible: Allergic reactions--skin rash, itching, hives, swelling of the face, lips, tongue, or throat Heart rhythm changes--fast or irregular heartbeat, dizziness, feeling faint or lightheaded, chest pain, trouble breathing Infection--fever, chills, cough, sore throat, wounds that don't heal, pain or trouble when passing urine, general feeling of discomfort or being unwell Infusion reactions--chest pain, shortness of breath or trouble breathing, feeling faint or lightheaded Sudden eye pain or  change in vision such as blurry vision, seeing halos around lights, vision loss Unusual bruising or bleeding Side effects that usually do not require medical attention (report to your care team if they continue or are bothersome): Constipation Diarrhea Fatigue Nausea Pain, tingling, or numbness in the hands or feet Swelling of the ankles, hands, or feet This list may not describe all possible side effects. Call your doctor for medical advice about side effects. You may report side effects to FDA at 1-800-FDA-1088. Where should I keep my medication? This medication is given in a hospital or clinic. It will not be stored at home. NOTE: This sheet is a summary. It may not cover all possible information. If you have questions about this medicine, talk to your doctor, pharmacist, or health care provider.  2023 Elsevier/Gold Standard (2021-11-29 00:00:00)  BELOW ARE SYMPTOMS THAT SHOULD BE REPORTED IMMEDIATELY: *FEVER GREATER THAN 100.4 F (38 C) OR HIGHER *CHILLS OR SWEATING *NAUSEA AND VOMITING THAT IS NOT CONTROLLED WITH YOUR NAUSEA MEDICATION *UNUSUAL SHORTNESS OF BREATH *UNUSUAL BRUISING OR BLEEDING *URINARY PROBLEMS (pain or burning when urinating, or frequent urination) *BOWEL PROBLEMS (unusual diarrhea, constipation, pain near the anus) TENDERNESS IN MOUTH AND THROAT WITH OR WITHOUT PRESENCE OF ULCERS (sore throat, sores in mouth, or a toothache) UNUSUAL RASH, SWELLING OR PAIN  UNUSUAL VAGINAL DISCHARGE OR ITCHING   Items with * indicate a potential emergency and should be followed up as soon as possible or go to the Emergency Department if any problems should occur.  Please show the CHEMOTHERAPY ALERT CARD or IMMUNOTHERAPY ALERT CARD at check-in to the Emergency Department and triage nurse.  Should you have questions after your visit or need to cancel or reschedule your appointment, please contact [facility] [phone]  and follow the prompts.  Office hours  are 8:00 a.m. to 4:30 p.m. Monday - Friday. Please note that voicemails left after 4:00 p.m. may not be returned until the following business day.  We are closed weekends and major holidays. You have access to a nurse at all times for urgent questions. Please call the main number to the clinic [phone] and follow the prompts.  For any non-urgent questions, you may also contact your provider using MyChart. We now offer e-Visits for anyone 39 and older to request care online for non-urgent symptoms. For details visit [email or web address].   Also download the MyChart app! Go to the app store, search "MyChart", open the app, select [facility], and log in with your MyChart username and password.

## 2023-01-07 NOTE — Progress Notes (Signed)
Patient presents today for Alexandra Price and Velcade infusion.  Patient is in satisfactory condition with no new complaints voiced.  Vital signs are stable.  Labs reviewed and all labs are within treatment parameters.  We will proceed with treatment per MD orders.    Pt took pre-meds Dexamethasone, Tylenol, and Benadryl at home prior to arrival.  [facility] and Velcade given today per MD orders. Tolerated infusion without adverse affects. Vital signs stable. No complaints at this time. Discharged from clinic ambulatory in stable condition. Alert and oriented x 3. F/U with [facility] as scheduled.

## 2023-01-10 ENCOUNTER — Inpatient Hospital Stay: Payer: 59

## 2023-01-10 VITALS — BP 135/75 | HR 71 | Temp 96.4°F | Resp 18

## 2023-01-10 DIAGNOSIS — C9000 Multiple myeloma not having achieved remission: Secondary | ICD-10-CM

## 2023-01-10 DIAGNOSIS — Z5112 Encounter for antineoplastic immunotherapy: Secondary | ICD-10-CM | POA: Diagnosis not present

## 2023-01-10 MED ORDER — BORTEZOMIB CHEMO SQ INJECTION 3.5 MG (2.5MG/ML)
1.0000 mg/m2 | Freq: Once | INTRAMUSCULAR | Status: AC
  Administered 2023-01-10: 2 mg via SUBCUTANEOUS
  Filled 2023-01-10: qty 0.8

## 2023-01-10 NOTE — Progress Notes (Signed)
Patient presents today for Velcade injection.  Patient is in satisfactory condition with no new complaints voiced.  Vital signs are stable.  Labs from 01/07/23 reviewed and all labs are within treatment parameters.  We will proceed with treatment per MD orders.    Patient tolerated Velcade injection with no complaints voiced.  Site clean and dry with no bruising or swelling noted.  No complaints of pain.  Discharged with vital signs stable and no signs or symptoms of distress noted.

## 2023-01-10 NOTE — Patient Instructions (Signed)
[facility] [place]  Discharge Instructions: Thank you for choosing [facility] to provide your oncology and hematology care.  If you have a lab appointment with the Cancer Center - please note that after April 8th, 2024, all labs will be drawn in the cancer center.  You do not have to check in or register with the main entrance as you have in the past but will complete your check-in in the cancer center.  Wear comfortable clothing and clothing appropriate for easy access to any Portacath or PICC line.   We strive to give you quality time with your provider. You may need to reschedule your appointment if you arrive late (15 or more minutes).  Arriving late affects you and other patients whose appointments are after yours.  Also, if you miss three or more appointments without notifying the office, you may be dismissed from the clinic at the provider's discretion.      For prescription refill requests, have your pharmacy contact our office and allow 72 hours for refills to be completed.    Today you received the following chemotherapy and/or immunotherapy agents Velcade. Bortezomib Injection What is this medication? BORTEZOMIB (bor TEZ oh mib) treats lymphoma. It may also be used to treat multiple myeloma, a type of bone marrow cancer. It works by blocking a protein that causes cancer cells to grow and multiply. This helps to slow or stop the spread of cancer cells. This medicine may be used for other purposes; ask your health care provider or pharmacist if you have questions. COMMON BRAND NAME(S): Velcade What should I tell my care team before I take this medication? They need to know if you have any of these conditions: Dehydration Diabetes Heart disease Liver disease Tingling of the fingers or toes or other nerve disorder An unusual or allergic reaction to bortezomib, other medications, foods, dyes, or preservatives If you or your partner are pregnant or trying to  get pregnant Breastfeeding How should I use this medication? This medication is injected into a vein or under the skin. It is given by your care team in a hospital or clinic setting. Talk to your care team about the use of this medication in children. Special care may be needed. Overdosage: If you think you have taken too much of this medicine contact a poison control center or emergency room at once. NOTE: This medicine is only for you. Do not share this medicine with others. What if I miss a dose? Keep appointments for follow-up doses. It is important not to miss your dose. Call your care team if you are unable to keep an appointment. What may interact with this medication? Ketoconazole Rifampin This list may not describe all possible interactions. Give your health care provider a list of all the medicines, herbs, non-prescription drugs, or dietary supplements you use. Also tell them if you smoke, drink alcohol, or use illegal drugs. Some items may interact with your medicine. What should I watch for while using this medication? Your condition will be monitored carefully while you are receiving this medication. You may need blood work while taking this medication. This medication may affect your coordination, reaction time, or judgment. Do not drive or operate machinery until you know how this medication affects you. Sit up or stand slowly to reduce the risk of dizzy or fainting spells. Drinking alcohol with this medication can increase the risk of these side effects. This medication may increase your risk of getting an infection. Call   your care team for advice if you get a fever, chills, sore throat, or other symptoms of a cold or flu. Do not treat yourself. Try to avoid being around people who are sick. Check with your care team if you have severe diarrhea, nausea, and vomiting, or if you sweat a lot. The loss of too much body fluid may make it dangerous for you to take this medication. Talk to  your care team if you may be pregnant. Serious birth defects can occur if you take this medication during pregnancy and for 7 months after the last dose. You will need a negative pregnancy test before starting this medication. Contraception is recommended while taking this medication and for 7 months after the last dose. Your care team can help you find the option that works for you. If your partner can get pregnant, use a condom during sex while taking this medication and for 4 months after the last dose. Do not breastfeed while taking this medication and for 2 months after the last dose. This medication may cause infertility. Talk to your care team if you are concerned about your fertility. What side effects may I notice from receiving this medication? Side effects that you should report to your care team as soon as possible: Allergic reactions--skin rash, itching, hives, swelling of the face, lips, tongue, or throat Bleeding--bloody or black, tar-like stools, vomiting blood or Tarek Cravens material that looks like coffee grounds, red or dark Joshus Rogan urine, small red or purple spots on skin, unusual bruising or bleeding Bleeding in the brain--severe headache, stiff neck, confusion, dizziness, change in vision, numbness or weakness of the face, arm, or leg, trouble speaking, trouble walking, vomiting Bowel blockage--stomach cramping, unable to have a bowel movement or pass gas, loss of appetite, vomiting Heart failure--shortness of breath, swelling of the ankles, feet, or hands, sudden weight gain, unusual weakness or fatigue Infection--fever, chills, cough, sore throat, wounds that don't heal, pain or trouble when passing urine, general feeling of discomfort or being unwell Liver injury--right upper belly pain, loss of appetite, nausea, light-colored stool, dark yellow or Jakel Alphin urine, yellowing skin or eyes, unusual weakness or fatigue Low blood pressure--dizziness, feeling faint or lightheaded, blurry  vision Lung injury--shortness of breath or trouble breathing, cough, spitting up blood, chest pain, fever Pain, tingling, or numbness in the hands or feet Severe or prolonged diarrhea Stomach pain, bloody diarrhea, pale skin, unusual weakness or fatigue, decrease in the amount of urine, which may be signs of hemolytic uremic syndrome Sudden and severe headache, confusion, change in vision, seizures, which may be signs of posterior reversible encephalopathy syndrome (PRES) TTP--purple spots on the skin or inside the mouth, pale skin, yellowing skin or eyes, unusual weakness or fatigue, fever, fast or irregular heartbeat, confusion, change in vision, trouble speaking, trouble walking Tumor lysis syndrome (TLS)--nausea, vomiting, diarrhea, decrease in the amount of urine, dark urine, unusual weakness or fatigue, confusion, muscle pain or cramps, fast or irregular heartbeat, joint pain Side effects that usually do not require medical attention (report to your care team if they continue or are bothersome): Constipation Diarrhea Fatigue Loss of appetite Nausea This list may not describe all possible side effects. Call your doctor for medical advice about side effects. You may report side effects to FDA at 1-800-FDA-1088. Where should I keep my medication? This medication is given in a hospital or clinic. It will not be stored at home. NOTE: This sheet is a summary. It may not cover all possible information. If   you have questions about this medicine, talk to your doctor, pharmacist, or health care provider.  2023 Elsevier/Gold Standard (2022-01-03 00:00:00)       To help prevent nausea and vomiting after your treatment, we encourage you to take your nausea medication as directed.  BELOW ARE SYMPTOMS THAT SHOULD BE REPORTED IMMEDIATELY: *FEVER GREATER THAN 100.4 F (38 C) OR HIGHER *CHILLS OR SWEATING *NAUSEA AND VOMITING THAT IS NOT CONTROLLED WITH YOUR NAUSEA MEDICATION *UNUSUAL SHORTNESS OF  BREATH *UNUSUAL BRUISING OR BLEEDING *URINARY PROBLEMS (pain or burning when urinating, or frequent urination) *BOWEL PROBLEMS (unusual diarrhea, constipation, pain near the anus) TENDERNESS IN MOUTH AND THROAT WITH OR WITHOUT PRESENCE OF ULCERS (sore throat, sores in mouth, or a toothache) UNUSUAL RASH, SWELLING OR PAIN  UNUSUAL VAGINAL DISCHARGE OR ITCHING   Items with * indicate a potential emergency and should be followed up as soon as possible or go to the Emergency Department if any problems should occur.  Please show the CHEMOTHERAPY ALERT CARD or IMMUNOTHERAPY ALERT CARD at check-in to the Emergency Department and triage nurse.  Should you have questions after your visit or need to cancel or reschedule your appointment, please contact [facility] [place] [phone]  and follow the prompts.  Office hours are 8:00 a.m. to 4:30 p.m. Monday - Friday. Please note that voicemails left after 4:00 p.m. may not be returned until the following business day.  We are closed weekends and major holidays. You have access to a nurse at all times for urgent questions. Please call the main number to the clinic [phone] and follow the prompts.  For any non-urgent questions, you may also contact your provider using MyChart. We now offer e-Visits for anyone 18 and older to request care online for non-urgent symptoms. For details visit [email or web address].   Also download the MyChart app! Go to the app store, search "MyChart", open the app, select [place], and log in with your MyChart username and password.   

## 2023-01-15 ENCOUNTER — Inpatient Hospital Stay: Payer: 59

## 2023-01-18 ENCOUNTER — Other Ambulatory Visit: Payer: Self-pay

## 2023-01-21 ENCOUNTER — Inpatient Hospital Stay: Payer: 59 | Attending: Hematology

## 2023-01-21 ENCOUNTER — Inpatient Hospital Stay: Payer: 59

## 2023-01-21 VITALS — BP 121/71 | HR 70 | Temp 98.4°F | Resp 16 | Wt 178.8 lb

## 2023-01-21 DIAGNOSIS — C9000 Multiple myeloma not having achieved remission: Secondary | ICD-10-CM | POA: Insufficient documentation

## 2023-01-21 DIAGNOSIS — R7989 Other specified abnormal findings of blood chemistry: Secondary | ICD-10-CM

## 2023-01-21 DIAGNOSIS — Z5112 Encounter for antineoplastic immunotherapy: Secondary | ICD-10-CM | POA: Diagnosis present

## 2023-01-21 DIAGNOSIS — Z95828 Presence of other vascular implants and grafts: Secondary | ICD-10-CM

## 2023-01-21 LAB — CBC WITH DIFFERENTIAL/PLATELET
Abs Immature Granulocytes: 0.01 10*3/uL (ref 0.00–0.07)
Basophils Absolute: 0 10*3/uL (ref 0.0–0.1)
Basophils Relative: 1 %
Eosinophils Absolute: 0.1 10*3/uL (ref 0.0–0.5)
Eosinophils Relative: 3 %
HCT: 30.9 % — ABNORMAL LOW (ref 36.0–46.0)
Hemoglobin: 9.8 g/dL — ABNORMAL LOW (ref 12.0–15.0)
Immature Granulocytes: 0 %
Lymphocytes Relative: 11 %
Lymphs Abs: 0.4 10*3/uL — ABNORMAL LOW (ref 0.7–4.0)
MCH: 33.2 pg (ref 26.0–34.0)
MCHC: 31.7 g/dL (ref 30.0–36.0)
MCV: 104.7 fL — ABNORMAL HIGH (ref 80.0–100.0)
Monocytes Absolute: 0.4 10*3/uL (ref 0.1–1.0)
Monocytes Relative: 12 %
Neutro Abs: 2.8 10*3/uL (ref 1.7–7.7)
Neutrophils Relative %: 73 %
Platelets: 139 10*3/uL — ABNORMAL LOW (ref 150–400)
RBC: 2.95 MIL/uL — ABNORMAL LOW (ref 3.87–5.11)
RDW: 12.7 % (ref 11.5–15.5)
WBC: 3.8 10*3/uL — ABNORMAL LOW (ref 4.0–10.5)
nRBC: 0 % (ref 0.0–0.2)

## 2023-01-21 LAB — COMPREHENSIVE METABOLIC PANEL
ALT: 20 U/L (ref 0–44)
AST: 19 U/L (ref 15–41)
Albumin: 3.9 g/dL (ref 3.5–5.0)
Alkaline Phosphatase: 58 U/L (ref 38–126)
Anion gap: 9 (ref 5–15)
BUN: 30 mg/dL — ABNORMAL HIGH (ref 8–23)
CO2: 27 mmol/L (ref 22–32)
Calcium: 9.1 mg/dL (ref 8.9–10.3)
Chloride: 101 mmol/L (ref 98–111)
Creatinine, Ser: 1.67 mg/dL — ABNORMAL HIGH (ref 0.44–1.00)
GFR, Estimated: 32 mL/min — ABNORMAL LOW (ref >60)
Glucose, Bld: 73 mg/dL (ref 70–99)
Potassium: 4.5 mmol/L (ref 3.5–5.1)
Sodium: 137 mmol/L (ref 135–145)
Total Bilirubin: 0.5 mg/dL (ref 0.3–1.2)
Total Protein: 6.5 g/dL (ref 6.5–8.1)

## 2023-01-21 MED ORDER — SODIUM CHLORIDE 0.9 % IV SOLN: Freq: Once | INTRAVENOUS | Status: AC

## 2023-01-21 MED ORDER — SODIUM CHLORIDE 0.9% FLUSH
10.0000 mL | Freq: Once | INTRAVENOUS | Status: AC
  Administered 2023-01-21: 10 mL via INTRAVENOUS

## 2023-01-21 MED ORDER — BORTEZOMIB CHEMO SQ INJECTION 3.5 MG (2.5MG/ML)
1.0000 mg/m2 | Freq: Once | INTRAMUSCULAR | Status: AC
  Administered 2023-01-21: 2 mg via SUBCUTANEOUS
  Filled 2023-01-21: qty 0.8

## 2023-01-21 MED ORDER — DARATUMUMAB-HYALURONIDASE-FIHJ 1800-30000 MG-UT/15ML ~~LOC~~ SOLN
1800.0000 mg | Freq: Once | SUBCUTANEOUS | Status: AC
  Administered 2023-01-21: 1800 mg via SUBCUTANEOUS
  Filled 2023-01-21: qty 15

## 2023-01-21 MED ORDER — HEPARIN SOD (PORK) LOCK FLUSH 100 UNIT/ML IV SOLN
500.0000 [IU] | Freq: Once | INTRAVENOUS | Status: AC
  Administered 2023-01-21: 500 [IU] via INTRAVENOUS

## 2023-01-21 NOTE — Patient Instructions (Signed)
[facility]  Discharge Instructions: Thank you for choosing [facility] to provide your oncology and hematology care.  If you have a lab appointment with the Cancer Center - please note that after April 8th, 2024, all labs will be drawn in the cancer center.  You do not have to check in or register with the main entrance as you have in the past but will complete your check-in in the cancer center.  Wear comfortable clothing and clothing appropriate for easy access to any Portacath or PICC line.   We strive to give you quality time with your provider. You may need to reschedule your appointment if you arrive late (15 or more minutes).  Arriving late affects you and other patients whose appointments are after yours.  Also, if you miss three or more appointments without notifying the office, you may be dismissed from the clinic at the provider's discretion.      For prescription refill requests, have your pharmacy contact our office and allow 72 hours for refills to be completed.    Today you received the following chemotherapy and/or immunotherapy agents daratumumab and velcade.       To help prevent nausea and vomiting after your treatment, we encourage you to take your nausea medication as directed.  BELOW ARE SYMPTOMS THAT SHOULD BE REPORTED IMMEDIATELY: *FEVER GREATER THAN 100.4 F (38 C) OR HIGHER *CHILLS OR SWEATING *NAUSEA AND VOMITING THAT IS NOT CONTROLLED WITH YOUR NAUSEA MEDICATION *UNUSUAL SHORTNESS OF BREATH *UNUSUAL BRUISING OR BLEEDING *URINARY PROBLEMS (pain or burning when urinating, or frequent urination) *BOWEL PROBLEMS (unusual diarrhea, constipation, pain near the anus) TENDERNESS IN MOUTH AND THROAT WITH OR WITHOUT PRESENCE OF ULCERS (sore throat, sores in mouth, or a toothache) UNUSUAL RASH, SWELLING OR PAIN  UNUSUAL VAGINAL DISCHARGE OR ITCHING   Items with * indicate a potential emergency and should be followed up as soon as  possible or go to the Emergency Department if any problems should occur.  Please show the CHEMOTHERAPY ALERT CARD or IMMUNOTHERAPY ALERT CARD at check-in to the Emergency Department and triage nurse.  Should you have questions after your visit or need to cancel or reschedule your appointment, please contact [facility] [phone]  and follow the prompts.  Office hours are 8:00 a.m. to 4:30 p.m. Monday - Friday. Please note that voicemails left after 4:00 p.m. may not be returned until the following business day.  We are closed weekends and major holidays. You have access to a nurse at all times for urgent questions. Please call the main number to the clinic [phone] and follow the prompts.  For any non-urgent questions, you may also contact your provider using MyChart. We now offer e-Visits for anyone 70 and older to request care online for non-urgent symptoms. For details visit [email or web address].   Also download the MyChart app! Go to the app store, search "MyChart", open the app, select [place], and log in with your MyChart username and password.

## 2023-01-21 NOTE — Progress Notes (Signed)
Port flushed with good blood return noted. No bruising or swelling at site. Bandaid applied and patient discharged in satisfactory condition. VVS stable with no signs or symptoms of distressed noted. 

## 2023-01-21 NOTE — Progress Notes (Signed)
Serum creatinine 1.67 today.  MD aware. Give NS over one hour verbal order Dr. Ellin Saba.  Patient took own premeds from home.   Patient tolerated Daratumumab and Velcade injection with no complaints voiced.  See MAR for details.  Labs reviewed. Injection site clean and dry with no bruising or swelling noted at site.  Band aid applied.  Patient tolerated hydration with no complaints voiced.  Port site clean and dry with good blood return noted before and after hydration.  No bruising or swelling noted with port.  Band aid applied.  VSS with discharge and left ambulatory with no s/s of distress noted.

## 2023-01-22 ENCOUNTER — Other Ambulatory Visit: Payer: Self-pay | Admitting: *Deleted

## 2023-01-22 DIAGNOSIS — C9000 Multiple myeloma not having achieved remission: Secondary | ICD-10-CM

## 2023-01-22 MED ORDER — HYDROCODONE-ACETAMINOPHEN 10-325 MG PO TABS
1.0000 | ORAL_TABLET | Freq: Four times a day (QID) | ORAL | 0 refills | Status: DC | PRN
Start: 2023-01-22 — End: 2023-02-18

## 2023-01-28 ENCOUNTER — Other Ambulatory Visit: Payer: Self-pay

## 2023-01-28 DIAGNOSIS — C9000 Multiple myeloma not having achieved remission: Secondary | ICD-10-CM

## 2023-01-31 ENCOUNTER — Other Ambulatory Visit: Payer: Self-pay | Admitting: *Deleted

## 2023-01-31 MED ORDER — MIDODRINE HCL 2.5 MG PO TABS: 2.5000 mg | ORAL_TABLET | Freq: Three times a day (TID) | ORAL | 1 refills | Status: DC

## 2023-02-01 NOTE — Progress Notes (Signed)
Patient called stating that her hands are staying very shaky and that she has been stuttering.   Doreatha Massed, MD  was called and he wants patient to go to the ED.   Called patient and informed her that Dr. Ellin Saba wants her to go to the ED. Patient states that she will think about it.

## 2023-02-02 ENCOUNTER — Other Ambulatory Visit: Payer: Self-pay | Admitting: Hematology

## 2023-02-04 ENCOUNTER — Encounter: Payer: Self-pay | Admitting: Hematology

## 2023-02-04 ENCOUNTER — Inpatient Hospital Stay: Payer: 59

## 2023-02-04 VITALS — BP 137/84 | HR 78 | Temp 98.2°F | Resp 16 | Wt 180.4 lb

## 2023-02-04 DIAGNOSIS — Z5112 Encounter for antineoplastic immunotherapy: Secondary | ICD-10-CM | POA: Diagnosis not present

## 2023-02-04 DIAGNOSIS — C9000 Multiple myeloma not having achieved remission: Secondary | ICD-10-CM

## 2023-02-04 DIAGNOSIS — Z95828 Presence of other vascular implants and grafts: Secondary | ICD-10-CM

## 2023-02-04 LAB — CBC WITH DIFFERENTIAL/PLATELET
Abs Immature Granulocytes: 0.01 10*3/uL (ref 0.00–0.07)
Basophils Absolute: 0 10*3/uL (ref 0.0–0.1)
Basophils Relative: 1 %
Eosinophils Absolute: 0.1 10*3/uL (ref 0.0–0.5)
Eosinophils Relative: 2 %
HCT: 29.4 % — ABNORMAL LOW (ref 36.0–46.0)
Hemoglobin: 9.4 g/dL — ABNORMAL LOW (ref 12.0–15.0)
Immature Granulocytes: 0 %
Lymphocytes Relative: 13 %
Lymphs Abs: 0.4 10*3/uL — ABNORMAL LOW (ref 0.7–4.0)
MCH: 33 pg (ref 26.0–34.0)
MCHC: 32 g/dL (ref 30.0–36.0)
MCV: 103.2 fL — ABNORMAL HIGH (ref 80.0–100.0)
Monocytes Absolute: 0.4 10*3/uL (ref 0.1–1.0)
Monocytes Relative: 12 %
Neutro Abs: 2.4 10*3/uL (ref 1.7–7.7)
Neutrophils Relative %: 72 %
Platelets: 129 10*3/uL — ABNORMAL LOW (ref 150–400)
RBC: 2.85 MIL/uL — ABNORMAL LOW (ref 3.87–5.11)
RDW: 12.4 % (ref 11.5–15.5)
WBC: 3.4 10*3/uL — ABNORMAL LOW (ref 4.0–10.5)
nRBC: 0 % (ref 0.0–0.2)

## 2023-02-04 LAB — COMPREHENSIVE METABOLIC PANEL
ALT: 16 U/L (ref 0–44)
AST: 17 U/L (ref 15–41)
Albumin: 3.8 g/dL (ref 3.5–5.0)
Alkaline Phosphatase: 68 U/L (ref 38–126)
Anion gap: 7 (ref 5–15)
BUN: 29 mg/dL — ABNORMAL HIGH (ref 8–23)
CO2: 28 mmol/L (ref 22–32)
Calcium: 8.4 mg/dL — ABNORMAL LOW (ref 8.9–10.3)
Chloride: 104 mmol/L (ref 98–111)
Creatinine, Ser: 1.43 mg/dL — ABNORMAL HIGH (ref 0.44–1.00)
GFR, Estimated: 38 mL/min — ABNORMAL LOW (ref >60)
Glucose, Bld: 124 mg/dL — ABNORMAL HIGH (ref 70–99)
Potassium: 4.3 mmol/L (ref 3.5–5.1)
Sodium: 139 mmol/L (ref 135–145)
Total Bilirubin: 0.7 mg/dL (ref 0.3–1.2)
Total Protein: 6.2 g/dL — ABNORMAL LOW (ref 6.5–8.1)

## 2023-02-04 MED ORDER — BORTEZOMIB CHEMO SQ INJECTION 3.5 MG (2.5MG/ML)
1.0000 mg/m2 | Freq: Once | INTRAMUSCULAR | Status: AC
  Administered 2023-02-04: 2 mg via SUBCUTANEOUS
  Filled 2023-02-04: qty 0.8

## 2023-02-04 MED ORDER — SODIUM CHLORIDE 0.9% FLUSH
10.0000 mL | Freq: Once | INTRAVENOUS | Status: AC
  Administered 2023-02-04: 10 mL via INTRAVENOUS

## 2023-02-04 MED ORDER — SODIUM CHLORIDE 0.9% FLUSH
10.0000 mL | INTRAVENOUS | Status: DC | PRN
  Administered 2023-02-04: 10 mL via INTRAVENOUS

## 2023-02-04 MED ORDER — HEPARIN SOD (PORK) LOCK FLUSH 100 UNIT/ML IV SOLN
500.0000 [IU] | Freq: Once | INTRAVENOUS | Status: AC
  Administered 2023-02-04: 500 [IU] via INTRAVENOUS

## 2023-02-04 NOTE — Patient Instructions (Signed)
[facility] [place]  Discharge Instructions: Thank you for choosing [facility] to provide your oncology and hematology care.  If you have a lab appointment with the Cancer Center - please note that after April 8th, 2024, all labs will be drawn in the cancer center.  You do not have to check in or register with the main entrance as you have in the past but will complete your check-in in the cancer center.  Wear comfortable clothing and clothing appropriate for easy access to any Portacath or PICC line.   We strive to give you quality time with your provider. You may need to reschedule your appointment if you arrive late (15 or more minutes).  Arriving late affects you and other patients whose appointments are after yours.  Also, if you miss three or more appointments without notifying the office, you may be dismissed from the clinic at the provider's discretion.      For prescription refill requests, have your pharmacy contact our office and allow 72 hours for refills to be completed.    Today you received the following chemotherapy and/or immunotherapy agents Velcade      To help prevent nausea and vomiting after your treatment, we encourage you to take your nausea medication as directed.  BELOW ARE SYMPTOMS THAT SHOULD BE REPORTED IMMEDIATELY: *FEVER GREATER THAN 100.4 F (38 C) OR HIGHER *CHILLS OR SWEATING *NAUSEA AND VOMITING THAT IS NOT CONTROLLED WITH YOUR NAUSEA MEDICATION *UNUSUAL SHORTNESS OF BREATH *UNUSUAL BRUISING OR BLEEDING *URINARY PROBLEMS (pain or burning when urinating, or frequent urination) *BOWEL PROBLEMS (unusual diarrhea, constipation, pain near the anus) TENDERNESS IN MOUTH AND THROAT WITH OR WITHOUT PRESENCE OF ULCERS (sore throat, sores in mouth, or a toothache) UNUSUAL RASH, SWELLING OR PAIN  UNUSUAL VAGINAL DISCHARGE OR ITCHING   Items with * indicate a potential emergency and should be followed up as soon as possible or go to the  Emergency Department if any problems should occur.  Please show the CHEMOTHERAPY ALERT CARD or IMMUNOTHERAPY ALERT CARD at check-in to the Emergency Department and triage nurse.  Should you have questions after your visit or need to cancel or reschedule your appointment, please contact [facility] [place] [phone]  and follow the prompts.  Office hours are 8:00 a.m. to 4:30 p.m. Monday - Friday. Please note that voicemails left after 4:00 p.m. may not be returned until the following business day.  We are closed weekends and major holidays. You have access to a nurse at all times for urgent questions. Please call the main number to the clinic [phone] and follow the prompts.  For any non-urgent questions, you may also contact your provider using MyChart. We now offer e-Visits for anyone 18 and older to request care online for non-urgent symptoms. For details visit [email or web address].   Also download the MyChart app! Go to the app store, search "MyChart", open the app, select [place], and log in with your MyChart username and password.   

## 2023-02-04 NOTE — Progress Notes (Signed)
Patient presents today for Velcade injection per providers order.  Vital signs and labs within parameters for treatment.  Patient has no new complaints at this time.  Alexandra Price presents today for Velcade injection per the provider's orders.  Stable duirng administration without incident; injection site WNL; see MAR for injection details.  Patient tolerated procedure well and without incident.  No questions or complaints noted at this time.

## 2023-02-04 NOTE — Progress Notes (Signed)
Patients port flushed without difficulty.  Good blood return noted with no bruising or swelling noted at site.  VSS. Patient remains accessed for treatment.  

## 2023-02-05 LAB — KAPPA/LAMBDA LIGHT CHAINS
Kappa free light chain: 6 mg/L (ref 3.3–19.4)
Kappa, lambda light chain ratio: 0.37 (ref 0.26–1.65)
Lambda free light chains: 16.1 mg/L (ref 5.7–26.3)

## 2023-02-06 LAB — PROTEIN ELECTROPHORESIS, SERUM
A/G Ratio: 1.5 (ref 0.7–1.7)
Albumin ELP: 3.4 g/dL (ref 2.9–4.4)
Alpha-1-Globulin: 0.2 g/dL (ref 0.0–0.4)
Alpha-2-Globulin: 0.9 g/dL (ref 0.4–1.0)
Beta Globulin: 0.9 g/dL (ref 0.7–1.3)
Gamma Globulin: 0.3 g/dL — ABNORMAL LOW (ref 0.4–1.8)
Globulin, Total: 2.3 g/dL (ref 2.2–3.9)
M-Spike, %: 0.1 g/dL — ABNORMAL HIGH
Total Protein ELP: 5.7 g/dL — ABNORMAL LOW (ref 6.0–8.5)

## 2023-02-12 ENCOUNTER — Other Ambulatory Visit: Payer: Self-pay | Admitting: *Deleted

## 2023-02-12 DIAGNOSIS — C9000 Multiple myeloma not having achieved remission: Secondary | ICD-10-CM

## 2023-02-12 MED ORDER — ACYCLOVIR 400 MG PO TABS
400.0000 mg | ORAL_TABLET | Freq: Two times a day (BID) | ORAL | 6 refills | Status: DC
Start: 2023-02-12 — End: 2023-04-15

## 2023-02-12 NOTE — Telephone Encounter (Signed)
Patient called with complaints of new onset abdominal pain.  Having regular bowel movements with no issues.  Weakness, worsening back pain and headaches.  States she is eating and drinking normally.  Consulted with Dr. Ellin Saba, who does not feel symptoms are coming from Myeloma or treatment and recommended follow up with PCP.  Patient made aware and verbalized understanding.

## 2023-02-13 ENCOUNTER — Other Ambulatory Visit (HOSPITAL_COMMUNITY): Payer: Self-pay | Admitting: Internal Medicine

## 2023-02-13 DIAGNOSIS — R42 Dizziness and giddiness: Secondary | ICD-10-CM

## 2023-02-15 ENCOUNTER — Other Ambulatory Visit: Payer: Self-pay | Admitting: Hematology

## 2023-02-17 NOTE — Progress Notes (Signed)
[facility] [address] [place] [address]    Clinic Day:  02/18/2023  Referring physician: Alvina Filbert, MD  Patient Care Team: Alvina Filbert, MD as PCP - General (Internal Medicine) Doreatha Massed, MD as Medical Oncologist (Medical Oncology) Lanelle Bal, DO as Consulting Physician (Gastroenterology)   ASSESSMENT & PLAN:   Assessment: 1.  IgG lambda plasma cell myeloma, stage II, standard risk: -4 cycles of KPD from 04/17/2018 through 08/21/2018, stem cell transplant on 10/23/2018. -PET scan on 01/21/2019 showed multiple bone lesions but without any hypermetabolic activity. -BMBX on 01/21/2019 with normocellular marrow with no increase in plasma cells.  Normal FISH.  MRD results negative. -Maintenance pomalidomide 2 mg 3 weeks on/1 week off started on 03/10/2019. -Bone marrow biopsy on 10/29/2019 shows trilineage hematopoiesis with no evidence of plasma cells.  Chromosome analysis and FISH are normal. -BM BX on 10/27/2020 at [facility] [place]-normocellular marrow with 30 to 40%, TLH.  2% of the total cells are CD 138+ plasma cells. - Pomalyst was held due to dizziness from 03/29/2021 through 04/26/2021. - MRI of the brain on 03/31/2021 did not show any evidence of intracranial lesions.  Multiple bone lesions in the skull compatible with sequela of myeloma.  - Labs at [facility] on 11/16/2021: M spike 0.24 g.  Immunofixation positive for IgG lambda. - PET scan on 12/14/2021: No evidence of FDG avid osseous or soft tissue myeloma. - She has developed left shoulder blade and right posterior rib pain for the last 3 to 4 months. - 24-hour urine on 12/14/2021: Total protein 136 mg.  Immune immunofixation positive for lambda type Bence-Jones protein. -- PET scan (05/24/2022): Single new hypermetabolic lytic lesion involving left lamina of T11.  No canal encroachment.  No other hypermetabolic bone lesions.  Stable diffuse lytic lesions throughout the axial and appendicular skeleton.  No  soft tissue lesions. - Maintenance Pomalyst discontinued on 05/29/2022 - XRT to the T11 completed on 06/20/2022 - Daratumumab, Velcade and dexamethasone started on 06/11/2022   2.  Pulmonary embolism: -CT angiogram on 04/21/2020 showed filling defect at [facility]. -VQ scan confirmed pulmonary embolism. -She is on Eliquis.   3.  Right shoulder and upper arm pain: - She received XRT to the chest from 12/26/2020 through 01/06/2021.   4.  Myeloma bone disease: - Denosumab held due to exposure of bone in the right lower jaw.  Last dose in 2020.    Plan: 1.  IgG lambda plasma cell myeloma: - She is tolerating Velcade and Darzalex reasonably well. - We reviewed myeloma labs from 02/04/2023: M spike is 0.1 g and stable.  FLC ratio 0.37 stable.  Lambda light chains are 16.1. - Reviewed labs today: Creatinine 1.36 and stable.  LFTs are normal.  CBC shows mild leukopenia and thrombocytopenia with normal ANC. - Continue Velcade every 2 weeks as maintenance and Darzalex every 4 weeks.  Continue dexamethasone 20 mg on days of injections. - RTC 8 weeks for follow-up with multiple myeloma labs. - She has intermittent vertigo when she turns her head to the right.  Currently on meclizine prescribed by her PMD.   2.  Back pain/left shoulder blade pain/right lateral and posterior rib pains: - Continue hydrocodone 10/325 every 6 hours as needed.  Pain is well-controlled.   3.  Hypomagnesemia: - Continue magnesium twice daily.  Magnesium was normal.   4.  Anxiety: - Continue Xanax 0.5 mg in the morning and 0.5 mg at bedtime.   5.  Sleeping difficulty: - Continue Ambien daily.   6.  Peripheral neuropathy: - Continue gabapentin 300 mg twice daily.   7.  Pulmonary embolism: - Continue Eliquis daily.  No bleeding issues.    Orders Placed This Encounter  Procedures   CBC with Differential    Standing Status:   Future    Standing Expiration Date:   03/03/2024   Comprehensive metabolic  panel    Standing Status:   Future    Standing Expiration Date:   03/03/2024   Comprehensive metabolic panel    Standing Status:   Future    Standing Expiration Date:   03/17/2024   CBC with Differential    Standing Status:   Future    Standing Expiration Date:   03/31/2024   Comprehensive metabolic panel    Standing Status:   Future    Standing Expiration Date:   03/31/2024   Comprehensive metabolic panel    Standing Status:   Future    Standing Expiration Date:   04/14/2024   CBC with Differential    Standing Status:   Future    Standing Expiration Date:   04/28/2024   Comprehensive metabolic panel    Standing Status:   Future    Standing Expiration Date:   04/28/2024   Comprehensive metabolic panel    Standing Status:   Future    Standing Expiration Date:   05/12/2024   CBC with Differential    Standing Status:   Future    Standing Expiration Date:   05/12/2024   CBC with Differential    Standing Status:   Future    Standing Expiration Date:   05/26/2024   Comprehensive metabolic panel    Standing Status:   Future    Standing Expiration Date:   05/26/2024   Comprehensive metabolic panel    Standing Status:   Future    Standing Expiration Date:   06/09/2024   CBC with Differential    Standing Status:   Future    Standing Expiration Date:   06/09/2024   CBC with Differential    Standing Status:   Future    Standing Expiration Date:   06/23/2024   Comprehensive metabolic panel    Standing Status:   Future    Standing Expiration Date:   06/23/2024   Comprehensive metabolic panel    Standing Status:   Future    Standing Expiration Date:   07/07/2024   CBC with Differential    Standing Status:   Future    Standing Expiration Date:   07/07/2024   CBC with Differential    Standing Status:   Future    Standing Expiration Date:   07/21/2024   Comprehensive metabolic panel    Standing Status:   Future    Standing Expiration Date:   07/21/2024   Comprehensive metabolic panel     Standing Status:   Future    Standing Expiration Date:   08/04/2024   CBC with Differential    Standing Status:   Future    Standing Expiration Date:   08/04/2024   CBC with Differential    Standing Status:   Future    Standing Expiration Date:   08/18/2024   Comprehensive metabolic panel    Standing Status:   Future    Standing Expiration Date:   08/18/2024      I,Katie Daubenspeck,acting as a scribe for Doreatha Massed, MD.,have documented all relevant documentation on the behalf of Doreatha Massed, MD,as directed by  Doreatha Massed, MD while in the presence of Doreatha Massed, MD.   I, Doreatha Massed MD, have reviewed the above documentation for accuracy and completeness, and I agree with the above.   Doreatha Massed, MD   7/1/20245:26 PM  CHIEF COMPLAINT:   Diagnosis: multiple myeloma    Cancer Staging  No matching staging information was found for the patient.   Prior Therapy: 1. KPD x 4 cycles from 04/17/2018 to 08/21/2018. 2. Stem cell transplant on 10/23/2018. 3. Radiation to left and right humerus 25 Gy in 10 fractions from 08/23/2020 to 08/25/2020. 4.  Maintenance Pomalyst until 06/08/2022  Current Therapy:  Darzalex, Velcade and dexamethasone started on 06/11/2022    HISTORY OF PRESENT ILLNESS:   Oncology History  Multiple myeloma not having achieved remission (HCC)  04/07/2018 Initial Diagnosis   Multiple myeloma not having achieved remission (HCC)   06/11/2022 -  Chemotherapy   Patient is on Treatment Plan : MYELOMA RELAPSED / REFRACTORY Daratumumab SQ + Bortezomib + Dexamethasone (DaraVd) q21d / Daratumumab SQ q28d      Multiple myeloma without remission (HCC)  04/15/2018 Initial Diagnosis   Multiple myeloma without remission (HCC)   04/17/2018 - 09/05/2018 Chemotherapy   The patient had dexamethasone (DECADRON) 4 MG tablet, 1 of 1 cycle, Start date: 04/15/2018, End date: 05/26/2018 palonosetron (ALOXI) injection 0.25 mg, 0.25 mg,  Intravenous,  Once, 1 of 1 cycle Administration: 0.25 mg (04/17/2018), 0.25 mg (04/24/2018), 0.25 mg (05/01/2018) cyclophosphamide (CYTOXAN) 540 mg in sodium chloride 0.9 % 250 mL chemo infusion, 300 mg/m2 = 540 mg, Intravenous,  Once, 1 of 1 cycle Administration: 540 mg (04/17/2018), 540 mg (04/24/2018), 540 mg (05/01/2018) carfilzomib (KYPROLIS) 36 mg in dextrose 5 % 50 mL chemo infusion, 20 mg/m2 = 36 mg, Intravenous, Once, 5 of 5 cycles Administration: 36 mg (04/17/2018), 36 mg (04/18/2018), 60 mg (04/24/2018), 60 mg (04/25/2018), 60 mg (05/01/2018), 60 mg (05/02/2018), 60 mg (05/15/2018), 60 mg (05/16/2018), 60 mg (06/12/2018), 60 mg (06/13/2018), 60 mg (06/26/2018), 60 mg (06/27/2018), 60 mg (07/03/2018), 60 mg (07/04/2018), 60 mg (07/24/2018), 60 mg (07/25/2018), 60 mg (07/31/2018), 60 mg (08/01/2018), 60 mg (08/08/2018), 60 mg (08/07/2018), 60 mg (08/21/2018), 60 mg (08/22/2018), 60 mg (08/28/2018), 60 mg (08/29/2018), 60 mg (09/04/2018), 60 mg (09/05/2018)  for chemotherapy treatment.    06/11/2022 -  Chemotherapy   Patient is on Treatment Plan : MYELOMA RELAPSED / REFRACTORY Daratumumab SQ + Bortezomib + Dexamethasone (DaraVd) q21d / Daratumumab SQ q28d         INTERVAL HISTORY:   Alexandra Price is a 77 y.o. female presenting to clinic today for follow up of multiple myeloma. She was last seen by me on 12/31/22.  Today, she states that she is doing well overall. Her appetite level is at 100%. Her energy level is at 40%.  PAST MEDICAL HISTORY:   Past Medical History: Past Medical History:  Diagnosis Date   Anxiety    Chronic kidney disease    Depression    Hypertension    Multiple myeloma (HCC)    multiple myeloma   Pre-diabetes     Surgical History: Past Surgical History:  Procedure Laterality Date   ABDOMINAL HYSTERECTOMY     total   APPENDECTOMY     BALLOON DILATION N/A 05/28/2022   Procedure: BALLOON DILATION;  Surgeon: Lanelle Bal, DO;  [facility];  Service: Endoscopy;  Laterality:  N/A;   BIOPSY  05/28/2022   Procedure: BIOPSY;  Surgeon: Lanelle Bal, DO;  [facility];  Service: Endoscopy;;   COLONOSCOPY WITH PROPOFOL N/A 12/25/2021   Procedure: COLONOSCOPY WITH PROPOFOL;  Surgeon: Lanelle Bal, DO;  [facility];  Service: Endoscopy;  Laterality: N/A;  2:30pm   ESOPHAGOGASTRODUODENOSCOPY (EGD) WITH PROPOFOL N/A 05/28/2022   Procedure: ESOPHAGOGASTRODUODENOSCOPY (EGD) WITH PROPOFOL;  Surgeon: Lanelle Bal, DO;  [facility];  Service: Endoscopy;  Laterality: N/A;  10:00am, asa 3   LAPAROSCOPIC APPENDECTOMY N/A 05/20/2018   Procedure: APPENDECTOMY LAPAROSCOPIC;  Surgeon: Franky Macho, MD;  Location: [facility];  Service: General;  Laterality: N/A;   POLYPECTOMY  12/25/2021   Procedure: POLYPECTOMY;  Surgeon: Lanelle Bal, DO;  [facility];  Service: Endoscopy;;   PORTACATH PLACEMENT Right 04/14/2018   Procedure: INSERTION PORT-A-CATH;  Surgeon: Franky Macho, MD;  Location: [facility];  Service: General;  Laterality: Right;    Social History: Social History   Socioeconomic History   Marital status: Divorced    Spouse name: Not on file   Number of children: 6   Years of education: Not on file   Highest education level: Not on file  Occupational History    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  Tobacco Use   Smoking status: Never   Smokeless tobacco: Never  Vaping Use   Vaping Use: Never used  Substance and Sexual Activity   Alcohol use: Never   Drug use: Not Currently   Sexual activity: Not Currently  Other Topics Concern   Not on file  Social History Narrative   Not on file   Social Determinants of Health   Financial Resource Strain: Low Risk  (07/06/2020)   Overall Financial Resource Strain (CARDIA)    Difficulty of Paying Living Expenses: Not hard at all  Food Insecurity: No Food Insecurity (09/17/2022)   Hunger Vital Sign    Worried About Running Out of Food in the Last Year: Never true    Ran Out of  Food in the Last Year: Never true  Transportation Needs: No Transportation Needs (09/17/2022)   PRAPARE - Administrator, Civil Service (Medical): No    Lack of Transportation (Non-Medical): No  Physical Activity: Inactive (07/06/2020)   Exercise Vital Sign    Days of Exercise per Week: 0 days    Minutes of Exercise per Session: 0 min  Stress: No Stress Concern Present (07/06/2020)   [employer] of Occupational Health - Occupational Stress Questionnaire    Feeling of Stress : Not at all  Social Connections: Moderately Isolated (07/06/2020)   Social Connection and Isolation Panel [NHANES]    Frequency of Communication with Friends and Family: More than three times a week    Frequency of Social Gatherings with Friends and Family: Twice a week    Attends Religious Services: 1 to 4 times per year    Active Member of [employer] or Organizations: No    Attends Banker Meetings: Never    Marital Status: Separated  Intimate Partner Violence: Not At Risk (09/17/2022)   Humiliation, Afraid, Rape, and Kick questionnaire    Fear of Current or Ex-Partner: No    Emotionally Abused: No    Physically Abused: No    Sexually Abused: No    Family History: Family History  Problem Relation Age of Onset   Heart disease Mother    Emphysema Father    Diabetes Sister    Depression Sister    Cancer Brother        liver, lung, and colon. colon  cancer at age 64.   Diabetes Brother     Current Medications:  Current Outpatient Medications:    acetaminophen (TYLENOL) 325 MG tablet, Take 650 mg by mouth as needed for moderate pain (prior to infusion)., Disp: , Rfl:    acyclovir (ZOVIRAX) 400 MG tablet, Take 1 tablet (400 mg total) by mouth 2 (two) times daily., Disp: 60 tablet, Rfl: 6   albuterol (PROVENTIL) (2.5 MG/3ML) 0.083% nebulizer solution, Take 3 mLs (2.5 mg total) by nebulization every 4 (four) hours as needed for wheezing or shortness of breath., Disp: 75 mL, Rfl: 2    albuterol (VENTOLIN HFA) 108 (90 Base) MCG/ACT inhaler, Inhale 2 puffs into the lungs every 4 (four) hours as needed for wheezing or shortness of breath., Disp: 18 g, Rfl: 2   ALPRAZolam (XANAX) 0.25 MG tablet, Take one tablet (0.25 mg) by mouth in the morning as needed, and two tablets (0.5 mg) at bedtime as needed, Disp: 90 tablet, Rfl: 2   Calcium Carb-Cholecalciferol (CALCIUM 1000 + D PO), Take 1,000 mg by mouth daily., Disp: , Rfl:    chlorthalidone (HYGROTON) 25 MG tablet, Take 25 mg by mouth every morning., Disp: , Rfl:    dexAMETHasone 20 MG TABS, Take 1 tablet by mouth as directed. Take 1 tablet 30 minutes prior to infusions, Disp: 30 tablet, Rfl: 2   diphenhydrAMINE (BENADRYL) 25 MG tablet, Take 50 mg by mouth as needed (prior to infusion)., Disp: , Rfl:    ELIQUIS 2.5 MG TABS tablet, Take 2.5 mg by mouth 2 (two) times daily., Disp: , Rfl:    escitalopram (LEXAPRO) 20 MG tablet, Take 20 mg by mouth daily., Disp: , Rfl:    gabapentin (NEURONTIN) 300 MG capsule, Take 1 capsule by mouth twice daily, Disp: 60 capsule, Rfl: 5   glipiZIDE (GLUCOTROL) 5 MG tablet, Take 1 tablet (5 mg total) by mouth daily before breakfast., Disp: 30 tablet, Rfl: 5   magnesium oxide (MAG-OX) 400 (240 Mg) MG tablet, Take 1 tablet (400 mg total) by mouth 2 (two) times daily., Disp: 60 tablet, Rfl: 4   meclizine (ANTIVERT) 12.5 MG tablet, Take 12.5 mg by mouth daily., Disp: , Rfl:    midodrine (PROAMATINE) 2.5 MG tablet, Take 1 tablet (2.5 mg total) by mouth 3 (three) times daily with meals., Disp: 90 tablet, Rfl: 1   montelukast (SINGULAIR) 10 MG tablet, TAKE 1 TABLET BY MOUTH AS DIRECTED 30 MINUTES PRIOR TO TREATMENT, Disp: 90 tablet, Rfl: 0   Multiple Vitamin (MULTI VITAMIN) TABS, Take 1 tablet by mouth daily., Disp: , Rfl:    pantoprazole (PROTONIX) 40 MG tablet, Take 1 tablet (40 mg total) by mouth daily., Disp: 90 tablet, Rfl: 6   polyethylene glycol (MIRALAX / GLYCOLAX) 17 g packet, Take 17 g by mouth daily as  needed for moderate constipation., Disp: 14 each, Rfl: 0   prochlorperazine (COMPAZINE) 10 MG tablet, Take 1 tablet (10 mg total) by mouth every 6 (six) hours as needed for nausea or vomiting., Disp: 30 tablet, Rfl: 0   rosuvastatin (CRESTOR) 10 MG tablet, Take 10 mg by mouth daily., Disp: , Rfl:    zaleplon (SONATA) 10 MG capsule, Take 1 capsule (10 mg total) by mouth at bedtime as needed for sleep., Disp: 30 capsule, Rfl: 3   zolpidem (AMBIEN) 10 MG tablet, Take 10 mg by mouth at bedtime as needed., Disp: , Rfl:    HYDROcodone-acetaminophen (NORCO) 10-325 MG tablet, Take 1 tablet by mouth every 6 (six) hours as  needed., Disp: 120 tablet, Rfl: 0   Allergies: Allergies  Allergen Reactions   Ciprofloxacin Anaphylaxis   Amoxicillin Other (See Comments)    unknown   Morphine And Codeine Nausea And Vomiting   Augmentin [Amoxicillin-Pot Clavulanate] Other (See Comments)    Headache, insomnia    REVIEW OF SYSTEMS:   Review of Systems  Constitutional:  Negative for chills, fatigue and fever.  HENT:   Negative for lump/mass, mouth sores, nosebleeds, sore throat and trouble swallowing.   Eyes:  Negative for eye problems.  Respiratory:  Negative for cough and shortness of breath.   Cardiovascular:  Negative for chest pain, leg swelling and palpitations.  Gastrointestinal:  Negative for abdominal pain, constipation, diarrhea, nausea and vomiting.  Genitourinary:  Negative for bladder incontinence, difficulty urinating, dysuria, frequency, hematuria and nocturia.   Musculoskeletal:  Positive for back pain. Negative for arthralgias, flank pain, myalgias and neck pain.  Skin:  Negative for itching and rash.  Neurological:  Positive for dizziness, headaches and numbness.  Hematological:  Does not bruise/bleed easily.  Psychiatric/Behavioral:  Positive for sleep disturbance. Negative for depression and suicidal ideas. The patient is not nervous/anxious.   All other systems reviewed and are  negative.    VITALS:   There were no vitals taken for this visit.  Wt Readings from Last 3 Encounters:  02/18/23 183 lb 12.8 oz (83.4 kg)  02/04/23 180 lb 6.4 oz (81.8 kg)  01/21/23 178 lb 12.8 oz (81.1 kg)    There is no height or weight on file to calculate BMI.  Performance status (ECOG): 1 - Symptomatic but completely ambulatory  PHYSICAL EXAM:   Physical Exam Vitals and nursing note reviewed. Exam conducted with a chaperone present.  Constitutional:      Appearance: Normal appearance.  Cardiovascular:     Rate and Rhythm: Normal rate and regular rhythm.     Pulses: Normal pulses.     Heart sounds: Normal heart sounds.  Pulmonary:     Effort: Pulmonary effort is normal.     Breath sounds: Normal breath sounds.  Abdominal:     Palpations: Abdomen is soft. There is no hepatomegaly, splenomegaly or mass.     Tenderness: There is no abdominal tenderness.  Musculoskeletal:     Right lower leg: No edema.     Left lower leg: No edema.  Lymphadenopathy:     Cervical: No cervical adenopathy.     Right cervical: No superficial, deep or posterior cervical adenopathy.    Left cervical: No superficial, deep or posterior cervical adenopathy.     Upper Body:     Right upper body: No supraclavicular or axillary adenopathy.     Left upper body: No supraclavicular or axillary adenopathy.  Neurological:     General: No focal deficit present.     Mental Status: She is alert and oriented to person, place, and time.  Psychiatric:        Mood and Affect: Mood normal.        Behavior: Behavior normal.     LABS:      Latest Ref Rng & Units 02/18/2023    8:16 AM 02/04/2023    1:02 PM 01/21/2023    1:05 PM  CBC  WBC 4.0 - 10.5 K/uL 3.3  3.4  3.8   Hemoglobin 12.0 - 15.0 g/dL 9.7  9.4  9.8   Hematocrit 36.0 - 46.0 % 30.7  29.4  30.9   Platelets 150 - 400 K/uL 135  129  139  Latest Ref Rng & Units 02/18/2023    8:16 AM 02/04/2023    1:02 PM 01/21/2023    1:05 PM  CMP  Glucose  70 - 99 mg/dL 161  096  73   BUN 8 - 23 mg/dL 23  29  30    Creatinine 0.44 - 1.00 mg/dL 0.45  4.09  8.11   Sodium 135 - 145 mmol/L 140  139  137   Potassium 3.5 - 5.1 mmol/L 4.5  4.3  4.5   Chloride 98 - 111 mmol/L 104  104  101   CO2 22 - 32 mmol/L 28  28  27    Calcium 8.9 - 10.3 mg/dL 8.8  8.4  9.1   Total Protein 6.5 - 8.1 g/dL 6.1  6.2  6.5   Total Bilirubin 0.3 - 1.2 mg/dL 0.3  0.7  0.5   Alkaline Phos 38 - 126 U/L 62  68  58   AST 15 - 41 U/L 17  17  19    ALT 0 - 44 U/L 17  16  20       No results found for: "CEA1", "CEA" / No results found for: "CEA1", "CEA" No results found for: "PSA1" No results found for: "BJY782" No results found for: "CAN125"  Lab Results  Component Value Date   TOTALPROTELP 5.7 (L) 02/04/2023   ALBUMINELP 3.4 02/04/2023   A1GS 0.2 02/04/2023   A2GS 0.9 02/04/2023   BETS 0.9 02/04/2023   GAMS 0.3 (L) 02/04/2023   MSPIKE 0.1 (H) 02/04/2023   SPEI Comment 02/04/2023   Lab Results  Component Value Date   TIBC 290 09/17/2022   TIBC 278 07/30/2022   TIBC 272 05/24/2022   FERRITIN 1,059 (H) 09/17/2022   FERRITIN 313 (H) 07/30/2022   FERRITIN 435 (H) 05/24/2022   IRONPCTSAT 10 (L) 09/17/2022   IRONPCTSAT 21 07/30/2022   IRONPCTSAT 27 05/24/2022   Lab Results  Component Value Date   LDH 129 05/24/2022   LDH 135 03/20/2022   LDH 129 12/14/2021     STUDIES:   No results found.

## 2023-02-18 ENCOUNTER — Inpatient Hospital Stay: Payer: 59 | Attending: Hematology | Admitting: Hematology

## 2023-02-18 ENCOUNTER — Inpatient Hospital Stay: Payer: 59

## 2023-02-18 ENCOUNTER — Other Ambulatory Visit: Payer: Self-pay | Admitting: *Deleted

## 2023-02-18 ENCOUNTER — Encounter: Payer: Self-pay | Admitting: Hematology

## 2023-02-18 DIAGNOSIS — Z5112 Encounter for antineoplastic immunotherapy: Secondary | ICD-10-CM | POA: Diagnosis not present

## 2023-02-18 DIAGNOSIS — Z7901 Long term (current) use of anticoagulants: Secondary | ICD-10-CM | POA: Diagnosis not present

## 2023-02-18 DIAGNOSIS — C9000 Multiple myeloma not having achieved remission: Secondary | ICD-10-CM

## 2023-02-18 DIAGNOSIS — G629 Polyneuropathy, unspecified: Secondary | ICD-10-CM | POA: Insufficient documentation

## 2023-02-18 DIAGNOSIS — I2699 Other pulmonary embolism without acute cor pulmonale: Secondary | ICD-10-CM | POA: Insufficient documentation

## 2023-02-18 LAB — CBC WITH DIFFERENTIAL/PLATELET
Abs Immature Granulocytes: 0.01 10*3/uL (ref 0.00–0.07)
Basophils Absolute: 0 10*3/uL (ref 0.0–0.1)
Basophils Relative: 1 %
Eosinophils Absolute: 0.1 10*3/uL (ref 0.0–0.5)
Eosinophils Relative: 3 %
HCT: 30.7 % — ABNORMAL LOW (ref 36.0–46.0)
Hemoglobin: 9.7 g/dL — ABNORMAL LOW (ref 12.0–15.0)
Immature Granulocytes: 0 %
Lymphocytes Relative: 11 %
Lymphs Abs: 0.4 10*3/uL — ABNORMAL LOW (ref 0.7–4.0)
MCH: 33.2 pg (ref 26.0–34.0)
MCHC: 31.6 g/dL (ref 30.0–36.0)
MCV: 105.1 fL — ABNORMAL HIGH (ref 80.0–100.0)
Monocytes Absolute: 0.4 10*3/uL (ref 0.1–1.0)
Monocytes Relative: 11 %
Neutro Abs: 2.5 10*3/uL (ref 1.7–7.7)
Neutrophils Relative %: 74 %
Platelets: 135 10*3/uL — ABNORMAL LOW (ref 150–400)
RBC: 2.92 MIL/uL — ABNORMAL LOW (ref 3.87–5.11)
RDW: 12.6 % (ref 11.5–15.5)
WBC: 3.3 10*3/uL — ABNORMAL LOW (ref 4.0–10.5)
nRBC: 0 % (ref 0.0–0.2)

## 2023-02-18 LAB — COMPREHENSIVE METABOLIC PANEL
ALT: 17 U/L (ref 0–44)
AST: 17 U/L (ref 15–41)
Albumin: 3.7 g/dL (ref 3.5–5.0)
Alkaline Phosphatase: 62 U/L (ref 38–126)
Anion gap: 8 (ref 5–15)
BUN: 23 mg/dL (ref 8–23)
CO2: 28 mmol/L (ref 22–32)
Calcium: 8.8 mg/dL — ABNORMAL LOW (ref 8.9–10.3)
Chloride: 104 mmol/L (ref 98–111)
Creatinine, Ser: 1.36 mg/dL — ABNORMAL HIGH (ref 0.44–1.00)
GFR, Estimated: 41 mL/min — ABNORMAL LOW (ref >60)
Glucose, Bld: 157 mg/dL — ABNORMAL HIGH (ref 70–99)
Potassium: 4.5 mmol/L (ref 3.5–5.1)
Sodium: 140 mmol/L (ref 135–145)
Total Bilirubin: 0.3 mg/dL (ref 0.3–1.2)
Total Protein: 6.1 g/dL — ABNORMAL LOW (ref 6.5–8.1)

## 2023-02-18 MED ORDER — HYDROCODONE-ACETAMINOPHEN 10-325 MG PO TABS
1.0000 | ORAL_TABLET | Freq: Four times a day (QID) | ORAL | 0 refills | Status: DC | PRN
Start: 2023-02-18 — End: 2023-03-19

## 2023-02-18 MED ORDER — ACETAMINOPHEN 325 MG PO TABS: 650.0000 mg | ORAL_TABLET | Freq: Once | ORAL | Status: DC

## 2023-02-18 MED ORDER — SODIUM CHLORIDE 0.9% FLUSH
10.0000 mL | Freq: Once | INTRAVENOUS | Status: AC
  Administered 2023-02-18: 10 mL via INTRAVENOUS

## 2023-02-18 MED ORDER — DIPHENHYDRAMINE HCL 25 MG PO CAPS: 50.0000 mg | ORAL_CAPSULE | Freq: Once | ORAL | Status: DC

## 2023-02-18 MED ORDER — HEPARIN SOD (PORK) LOCK FLUSH 100 UNIT/ML IV SOLN
500.0000 [IU] | Freq: Once | INTRAVENOUS | Status: AC
  Administered 2023-02-18: 500 [IU] via INTRAVENOUS

## 2023-02-18 MED ORDER — DEXAMETHASONE 4 MG PO TABS: 20.0000 mg | ORAL_TABLET | Freq: Once | ORAL | Status: DC

## 2023-02-18 MED ORDER — DARATUMUMAB-HYALURONIDASE-FIHJ 1800-30000 MG-UT/15ML ~~LOC~~ SOLN
1800.0000 mg | Freq: Once | SUBCUTANEOUS | Status: AC
  Administered 2023-02-18: 1800 mg via SUBCUTANEOUS
  Filled 2023-02-18: qty 15

## 2023-02-18 MED ORDER — BORTEZOMIB CHEMO SQ INJECTION 3.5 MG (2.5MG/ML)
1.0000 mg/m2 | Freq: Once | INTRAMUSCULAR | Status: AC
  Administered 2023-02-18: 2 mg via SUBCUTANEOUS
  Filled 2023-02-18: qty 0.8

## 2023-02-18 NOTE — Patient Instructions (Signed)
[facility]  Discharge Instructions: Thank you for choosing [facility] to provide your oncology and hematology care.  If you have a lab appointment with the Cancer Center - please note that after April 8th, 2024, all labs will be drawn in the cancer center.  You do not have to check in or register with the main entrance as you have in the past but will complete your check-in in the cancer center.  Wear comfortable clothing and clothing appropriate for easy access to any Portacath or PICC line.   We strive to give you quality time with your provider. You may need to reschedule your appointment if you arrive late (15 or more minutes).  Arriving late affects you and other patients whose appointments are after yours.  Also, if you miss three or more appointments without notifying the office, you may be dismissed from the clinic at the provider's discretion.      For prescription refill requests, have your pharmacy contact our office and allow 72 hours for refills to be completed.    Today you received the following chemotherapy and/or immunotherapy agents Daratumumab/Velcade      To help prevent nausea and vomiting after your treatment, we encourage you to take your nausea medication as directed.  BELOW ARE SYMPTOMS THAT SHOULD BE REPORTED IMMEDIATELY: *FEVER GREATER THAN 100.4 F (38 C) OR HIGHER *CHILLS OR SWEATING *NAUSEA AND VOMITING THAT IS NOT CONTROLLED WITH YOUR NAUSEA MEDICATION *UNUSUAL SHORTNESS OF BREATH *UNUSUAL BRUISING OR BLEEDING *URINARY PROBLEMS (pain or burning when urinating, or frequent urination) *BOWEL PROBLEMS (unusual diarrhea, constipation, pain near the anus) TENDERNESS IN MOUTH AND THROAT WITH OR WITHOUT PRESENCE OF ULCERS (sore throat, sores in mouth, or a toothache) UNUSUAL RASH, SWELLING OR PAIN  UNUSUAL VAGINAL DISCHARGE OR ITCHING   Items with * indicate a potential emergency and should be followed up as soon as possible or  go to the Emergency Department if any problems should occur.  Please show the CHEMOTHERAPY ALERT CARD or IMMUNOTHERAPY ALERT CARD at check-in to the Emergency Department and triage nurse.  Should you have questions after your visit or need to cancel or reschedule your appointment, please contact [facility] AT  [phone]  and follow the prompts.  Office hours are 8:00 a.m. to 4:30 p.m. Monday - Friday. Please note that voicemails left after 4:00 p.m. may not be returned until the following business day.  We are closed weekends and major holidays. You have access to a nurse at all times for urgent questions. Please call the main number to the clinic [phone] and follow the prompts.  For any non-urgent questions, you may also contact your provider using MyChart. We now offer e-Visits for anyone 18 and older to request care online for non-urgent symptoms. For details visit [email or web address].   Also download the MyChart app! Go to the app store, search "MyChart", open the app, select [facility], and log in with your MyChart username and password.   

## 2023-02-18 NOTE — Progress Notes (Signed)
Patient presents today for Velcade and Daratumumab injections per providers order.  Patient states that she took premedications at home prior to visit.   Labs and Vital signs reviewed by the MD.  Message received from Chapman Moss RN/Dr. Ellin Saba patient okay for treatment.  Stable during administration without incident; injection site WNL; see MAR for injection details.  Patient tolerated procedure well and without incident.  No questions or complaints noted at this time. Discharge from clinic ambulatory in stable condition.  Alert and oriented X 3.  Follow up with [facility] as scheduled.

## 2023-02-18 NOTE — Progress Notes (Signed)
Patient has been examined by Dr. Katragadda. Vital signs and labs have been reviewed by MD - ANC, Creatinine, LFTs, hemoglobin, and platelets are within treatment parameters per M.D. - pt may proceed with treatment.  Primary RN and pharmacy notified.  

## 2023-02-18 NOTE — Patient Instructions (Signed)
[facility] at [facility] Discharge Instructions   You were seen and examined today by Dr. Katragadda.  He reviewed the results of your lab work which are normal/stable.   We will proceed with your treatment today.  Return as scheduled.    Thank you for choosing [facility] at [facility] to provide your oncology and hematology care.  To afford each patient quality time with our provider, please arrive at least 15 minutes before your scheduled appointment time.   If you have a lab appointment with the Cancer Center please come in thru the Main Entrance and check in at the main information desk.  You need to re-schedule your appointment should you arrive 10 or more minutes late.  We strive to give you quality time with our providers, and arriving late affects you and other patients whose appointments are after yours.  Also, if you no show three or more times for appointments you may be dismissed from the clinic at the providers discretion.     Again, thank you for choosing [facility].  Our hope is that these requests will decrease the amount of time that you wait before being seen by our physicians.       _____________________________________________________________  Should you have questions after your visit to [facility], please contact our office at [phone] and follow the prompts.  Our office hours are 8:00 a.m. and 4:30 p.m. Monday - Friday.  Please note that voicemails left after 4:00 p.m. may not be returned until the following business day.  We are closed weekends and major holidays.  You do have access to a nurse 24-7, just call the main number to the clinic [phone] and do not press any options, hold on the line and a nurse will answer the phone.    For prescription refill requests, have your pharmacy contact our office and allow 72 hours.    Due to Covid, you will need to wear a mask upon entering  the hospital. If you do not have a mask, a mask will be given to you at the Main Entrance upon arrival. For doctor visits, patients may have 1 support person age 18 or older with them. For treatment visits, patients can not have anyone with them due to social distancing guidelines and our immunocompromised population.      

## 2023-02-19 ENCOUNTER — Other Ambulatory Visit: Payer: Self-pay

## 2023-02-19 ENCOUNTER — Other Ambulatory Visit: Payer: Self-pay | Admitting: *Deleted

## 2023-02-20 ENCOUNTER — Encounter: Payer: Self-pay | Admitting: Hematology

## 2023-02-22 ENCOUNTER — Other Ambulatory Visit: Payer: Self-pay | Admitting: Hematology

## 2023-02-25 ENCOUNTER — Encounter: Payer: Self-pay | Admitting: Hematology

## 2023-02-26 ENCOUNTER — Other Ambulatory Visit: Payer: Self-pay | Admitting: *Deleted

## 2023-02-26 MED ORDER — ALPRAZOLAM 0.25 MG PO TABS: ORAL_TABLET | ORAL | 2 refills | Status: DC

## 2023-03-04 ENCOUNTER — Inpatient Hospital Stay: Payer: 59

## 2023-03-04 ENCOUNTER — Other Ambulatory Visit: Payer: Self-pay | Admitting: *Deleted

## 2023-03-04 VITALS — BP 140/63 | HR 64 | Resp 18

## 2023-03-04 VITALS — BP 149/80 | HR 78 | Temp 97.1°F | Resp 18 | Wt 186.0 lb

## 2023-03-04 DIAGNOSIS — Z95828 Presence of other vascular implants and grafts: Secondary | ICD-10-CM

## 2023-03-04 DIAGNOSIS — Z5112 Encounter for antineoplastic immunotherapy: Secondary | ICD-10-CM | POA: Diagnosis not present

## 2023-03-04 DIAGNOSIS — C9000 Multiple myeloma not having achieved remission: Secondary | ICD-10-CM

## 2023-03-04 LAB — CBC WITH DIFFERENTIAL/PLATELET
Abs Immature Granulocytes: 0.01 10*3/uL (ref 0.00–0.07)
Basophils Absolute: 0 10*3/uL (ref 0.0–0.1)
Basophils Relative: 1 %
Eosinophils Absolute: 0.1 10*3/uL (ref 0.0–0.5)
Eosinophils Relative: 4 %
HCT: 30.4 % — ABNORMAL LOW (ref 36.0–46.0)
Hemoglobin: 9.5 g/dL — ABNORMAL LOW (ref 12.0–15.0)
Immature Granulocytes: 0 %
Lymphocytes Relative: 18 %
Lymphs Abs: 0.6 10*3/uL — ABNORMAL LOW (ref 0.7–4.0)
MCH: 32.2 pg (ref 26.0–34.0)
MCHC: 31.3 g/dL (ref 30.0–36.0)
MCV: 103.1 fL — ABNORMAL HIGH (ref 80.0–100.0)
Monocytes Absolute: 0.4 10*3/uL (ref 0.1–1.0)
Monocytes Relative: 12 %
Neutro Abs: 2.2 10*3/uL (ref 1.7–7.7)
Neutrophils Relative %: 65 %
Platelets: 133 10*3/uL — ABNORMAL LOW (ref 150–400)
RBC: 2.95 MIL/uL — ABNORMAL LOW (ref 3.87–5.11)
RDW: 12.5 % (ref 11.5–15.5)
WBC: 3.4 10*3/uL — ABNORMAL LOW (ref 4.0–10.5)
nRBC: 0 % (ref 0.0–0.2)

## 2023-03-04 LAB — COMPREHENSIVE METABOLIC PANEL
ALT: 24 U/L (ref 0–44)
AST: 21 U/L (ref 15–41)
Albumin: 4 g/dL (ref 3.5–5.0)
Alkaline Phosphatase: 65 U/L (ref 38–126)
Anion gap: 8 (ref 5–15)
BUN: 21 mg/dL (ref 8–23)
CO2: 26 mmol/L (ref 22–32)
Calcium: 9.1 mg/dL (ref 8.9–10.3)
Chloride: 103 mmol/L (ref 98–111)
Creatinine, Ser: 1.4 mg/dL — ABNORMAL HIGH (ref 0.44–1.00)
GFR, Estimated: 39 mL/min — ABNORMAL LOW (ref >60)
Glucose, Bld: 73 mg/dL (ref 70–99)
Potassium: 3.9 mmol/L (ref 3.5–5.1)
Sodium: 137 mmol/L (ref 135–145)
Total Bilirubin: 0.5 mg/dL (ref 0.3–1.2)
Total Protein: 6.5 g/dL (ref 6.5–8.1)

## 2023-03-04 LAB — MAGNESIUM: Magnesium: 2.1 mg/dL (ref 1.7–2.4)

## 2023-03-04 MED ORDER — SODIUM CHLORIDE 0.9% FLUSH
10.0000 mL | INTRAVENOUS | Status: DC | PRN
  Administered 2023-03-04: 10 mL via INTRAVENOUS

## 2023-03-04 MED ORDER — BORTEZOMIB CHEMO SQ INJECTION 3.5 MG (2.5MG/ML)
1.0000 mg/m2 | Freq: Once | INTRAMUSCULAR | Status: AC
  Administered 2023-03-04: 2 mg via SUBCUTANEOUS
  Filled 2023-03-04: qty 0.8

## 2023-03-04 MED ORDER — DEXAMETHASONE 4 MG PO TABS
20.0000 mg | ORAL_TABLET | Freq: Once | ORAL | Status: DC
  Filled 2023-03-04: qty 5

## 2023-03-04 MED ORDER — HEPARIN SOD (PORK) LOCK FLUSH 100 UNIT/ML IV SOLN: 500.0000 [IU] | Freq: Once | INTRAVENOUS | Status: DC

## 2023-03-04 MED ORDER — ESCITALOPRAM OXALATE 20 MG PO TABS: 20.0000 mg | ORAL_TABLET | Freq: Every day | ORAL | 3 refills | Status: DC

## 2023-03-04 MED ORDER — HEPARIN SOD (PORK) LOCK FLUSH 100 UNIT/ML IV SOLN
500.0000 [IU] | Freq: Once | INTRAVENOUS | Status: AC
  Administered 2023-03-04: 500 [IU] via INTRAVENOUS

## 2023-03-04 MED ORDER — SODIUM CHLORIDE 0.9% FLUSH
10.0000 mL | Freq: Once | INTRAVENOUS | Status: AC
  Administered 2023-03-04: 10 mL via INTRAVENOUS

## 2023-03-04 NOTE — Progress Notes (Signed)
Patient presents today for chemotherapy infusion. Patient reports some lightheadedness and "shaking". Patient reports nit eating yet today.  Vital signs are stable.  Labs reviewed all labs are within treatment parameters. Patient's lightheadedness and shaking resolved after eating. Dr Ellin Saba made aware. We will proceed with treatment per MD orders.  Patient reports taking her premeds of 20mg  Decadron at 1pm today prior to arrival.    Patient tolerated treatment well with no complaints voiced.  Patient left ambulatory in stable condition.  Vital signs stable at discharge.  Follow up as scheduled.

## 2023-03-04 NOTE — Patient Instructions (Signed)
[facility]  Discharge Instructions: Thank you for choosing [facility] to provide your oncology and hematology care.  If you have a lab appointment with the Cancer Center - please note that after April 8th, 2024, all labs will be drawn in the cancer center.  You do not have to check in or register with the main entrance as you have in the past but will complete your check-in in the cancer center.  Wear comfortable clothing and clothing appropriate for easy access to any Portacath or PICC line.   We strive to give you quality time with your provider. You may need to reschedule your appointment if you arrive late (15 or more minutes).  Arriving late affects you and other patients whose appointments are after yours.  Also, if you miss three or more appointments without notifying the office, you may be dismissed from the clinic at the provider's discretion.      For prescription refill requests, have your pharmacy contact our office and allow 72 hours for refills to be completed.    Today you received the following chemotherapy and/or immunotherapy agents Velcade. Bortezomib Injection What is this medication? BORTEZOMIB (bor TEZ oh mib) treats lymphoma. It may also be used to treat multiple myeloma, a type of bone marrow cancer. It works by blocking a protein that causes cancer cells to grow and multiply. This helps to slow or stop the spread of cancer cells. This medicine may be used for other purposes; ask your health care provider or pharmacist if you have questions. COMMON BRAND NAME(S): Velcade What should I tell my care team before I take this medication? They need to know if you have any of these conditions: Dehydration Diabetes Heart disease Liver disease Tingling of the fingers or toes or other nerve disorder An unusual or allergic reaction to bortezomib, other medications, foods, dyes, or preservatives If you or your partner are pregnant or trying to  get pregnant Breastfeeding How should I use this medication? This medication is injected into a vein or under the skin. It is given by your care team in a hospital or clinic setting. Talk to your care team about the use of this medication in children. Special care may be needed. Overdosage: If you think you have taken too much of this medicine contact a poison control center or emergency room at once. NOTE: This medicine is only for you. Do not share this medicine with others. What if I miss a dose? Keep appointments for follow-up doses. It is important not to miss your dose. Call your care team if you are unable to keep an appointment. What may interact with this medication? Ketoconazole Rifampin This list may not describe all possible interactions. Give your health care provider a list of all the medicines, herbs, non-prescription drugs, or dietary supplements you use. Also tell them if you smoke, drink alcohol, or use illegal drugs. Some items may interact with your medicine. What should I watch for while using this medication? Your condition will be monitored carefully while you are receiving this medication. You may need blood work while taking this medication. This medication may affect your coordination, reaction time, or judgment. Do not drive or operate machinery until you know how this medication affects you. Sit up or stand slowly to reduce the risk of dizzy or fainting spells. Drinking alcohol with this medication can increase the risk of these side effects. This medication may increase your risk of getting an infection. Call   your care team for advice if you get a fever, chills, sore throat, or other symptoms of a cold or flu. Do not treat yourself. Try to avoid being around people who are sick. Check with your care team if you have severe diarrhea, nausea, and vomiting, or if you sweat a lot. The loss of too much body fluid may make it dangerous for you to take this medication. Talk to  your care team if you may be pregnant. Serious birth defects can occur if you take this medication during pregnancy and for 7 months after the last dose. You will need a negative pregnancy test before starting this medication. Contraception is recommended while taking this medication and for 7 months after the last dose. Your care team can help you find the option that works for you. If your partner can get pregnant, use a condom during sex while taking this medication and for 4 months after the last dose. Do not breastfeed while taking this medication and for 2 months after the last dose. This medication may cause infertility. Talk to your care team if you are concerned about your fertility. What side effects may I notice from receiving this medication? Side effects that you should report to your care team as soon as possible: Allergic reactions--skin rash, itching, hives, swelling of the face, lips, tongue, or throat Bleeding--bloody or black, tar-like stools, vomiting blood or brown material that looks like coffee grounds, red or dark brown urine, small red or purple spots on skin, unusual bruising or bleeding Bleeding in the brain--severe headache, stiff neck, confusion, dizziness, change in vision, numbness or weakness of the face, arm, or leg, trouble speaking, trouble walking, vomiting Bowel blockage--stomach cramping, unable to have a bowel movement or pass gas, loss of appetite, vomiting Heart failure--shortness of breath, swelling of the ankles, feet, or hands, sudden weight gain, unusual weakness or fatigue Infection--fever, chills, cough, sore throat, wounds that don't heal, pain or trouble when passing urine, general feeling of discomfort or being unwell Liver injury--right upper belly pain, loss of appetite, nausea, light-colored stool, dark yellow or brown urine, yellowing skin or eyes, unusual weakness or fatigue Low blood pressure--dizziness, feeling faint or lightheaded, blurry  vision Lung injury--shortness of breath or trouble breathing, cough, spitting up blood, chest pain, fever Pain, tingling, or numbness in the hands or feet Severe or prolonged diarrhea Stomach pain, bloody diarrhea, pale skin, unusual weakness or fatigue, decrease in the amount of urine, which may be signs of hemolytic uremic syndrome Sudden and severe headache, confusion, change in vision, seizures, which may be signs of posterior reversible encephalopathy syndrome (PRES) TTP--purple spots on the skin or inside the mouth, pale skin, yellowing skin or eyes, unusual weakness or fatigue, fever, fast or irregular heartbeat, confusion, change in vision, trouble speaking, trouble walking Tumor lysis syndrome (TLS)--nausea, vomiting, diarrhea, decrease in the amount of urine, dark urine, unusual weakness or fatigue, confusion, muscle pain or cramps, fast or irregular heartbeat, joint pain Side effects that usually do not require medical attention (report to your care team if they continue or are bothersome): Constipation Diarrhea Fatigue Loss of appetite Nausea This list may not describe all possible side effects. Call your doctor for medical advice about side effects. You may report side effects to FDA at 1-800-FDA-1088. Where should I keep my medication? This medication is given in a hospital or clinic. It will not be stored at home. NOTE: This sheet is a summary. It may not cover all possible information. If   you have questions about this medicine, talk to your doctor, pharmacist, or health care provider.  2024 Elsevier/Gold Standard (2022-01-09 00:00:00)        To help prevent nausea and vomiting after your treatment, we encourage you to take your nausea medication as directed.  BELOW ARE SYMPTOMS THAT SHOULD BE REPORTED IMMEDIATELY: *FEVER GREATER THAN 100.4 F (38 C) OR HIGHER *CHILLS OR SWEATING *NAUSEA AND VOMITING THAT IS NOT CONTROLLED WITH YOUR NAUSEA MEDICATION *UNUSUAL SHORTNESS OF  BREATH *UNUSUAL BRUISING OR BLEEDING *URINARY PROBLEMS (pain or burning when urinating, or frequent urination) *BOWEL PROBLEMS (unusual diarrhea, constipation, pain near the anus) TENDERNESS IN MOUTH AND THROAT WITH OR WITHOUT PRESENCE OF ULCERS (sore throat, sores in mouth, or a toothache) UNUSUAL RASH, SWELLING OR PAIN  UNUSUAL VAGINAL DISCHARGE OR ITCHING   Items with * indicate a potential emergency and should be followed up as soon as possible or go to the Emergency Department if any problems should occur.  Please show the CHEMOTHERAPY ALERT CARD or IMMUNOTHERAPY ALERT CARD at check-in to the Emergency Department and triage nurse.  Should you have questions after your visit or need to cancel or reschedule your appointment, please contact [facility] [phone]  and follow the prompts.  Office hours are 8:00 a.m. to 4:30 p.m. Monday - Friday. Please note that voicemails left after 4:00 p.m. may not be returned until the following business day.  We are closed weekends and major holidays. You have access to a nurse at all times for urgent questions. Please call the main number to the clinic [phone] and follow the prompts.  For any non-urgent questions, you may also contact your provider using MyChart. We now offer e-Visits for anyone 18 and older to request care online for non-urgent symptoms. For details visit [email or web address].   Also download the MyChart app! Go to the app store, search "MyChart", open the app, select [place], and log in with your MyChart username and password.   

## 2023-03-07 ENCOUNTER — Other Ambulatory Visit: Payer: Self-pay

## 2023-03-11 ENCOUNTER — Ambulatory Visit (HOSPITAL_COMMUNITY)
Admission: RE | Admit: 2023-03-11 | Discharge: 2023-03-11 | Disposition: A | Discharge status: Home / Self Care | Payer: 59 | Source: Ambulatory Visit | Attending: Internal Medicine | Admitting: Internal Medicine

## 2023-03-11 DIAGNOSIS — R42 Dizziness and giddiness: Secondary | ICD-10-CM | POA: Diagnosis not present

## 2023-03-11 MED ORDER — GADOBUTROL 1 MMOL/ML IV SOLN
8.0000 mL | Freq: Once | INTRAVENOUS | Status: AC | PRN
  Administered 2023-03-11: 8 mL via INTRAVENOUS

## 2023-03-14 ENCOUNTER — Encounter: Payer: Self-pay | Admitting: Hematology

## 2023-03-17 ENCOUNTER — Other Ambulatory Visit: Payer: Self-pay

## 2023-03-18 ENCOUNTER — Inpatient Hospital Stay: Payer: 59

## 2023-03-18 ENCOUNTER — Inpatient Hospital Stay: Payer: 59 | Admitting: Hematology

## 2023-03-18 DIAGNOSIS — C9000 Multiple myeloma not having achieved remission: Secondary | ICD-10-CM

## 2023-03-18 DIAGNOSIS — Z5112 Encounter for antineoplastic immunotherapy: Secondary | ICD-10-CM | POA: Diagnosis not present

## 2023-03-18 LAB — CBC WITH DIFFERENTIAL/PLATELET
Abs Immature Granulocytes: 0.01 10*3/uL (ref 0.00–0.07)
Basophils Absolute: 0 10*3/uL (ref 0.0–0.1)
Basophils Relative: 1 %
Eosinophils Absolute: 0.1 10*3/uL (ref 0.0–0.5)
Eosinophils Relative: 3 %
HCT: 31.6 % — ABNORMAL LOW (ref 36.0–46.0)
Hemoglobin: 10 g/dL — ABNORMAL LOW (ref 12.0–15.0)
Immature Granulocytes: 0 %
Lymphocytes Relative: 13 %
Lymphs Abs: 0.5 10*3/uL — ABNORMAL LOW (ref 0.7–4.0)
MCH: 33.1 pg (ref 26.0–34.0)
MCHC: 31.6 g/dL (ref 30.0–36.0)
MCV: 104.6 fL — ABNORMAL HIGH (ref 80.0–100.0)
Monocytes Absolute: 0.4 10*3/uL (ref 0.1–1.0)
Monocytes Relative: 11 %
Neutro Abs: 2.5 10*3/uL (ref 1.7–7.7)
Neutrophils Relative %: 72 %
Platelets: 122 10*3/uL — ABNORMAL LOW (ref 150–400)
RBC: 3.02 MIL/uL — ABNORMAL LOW (ref 3.87–5.11)
RDW: 12.4 % (ref 11.5–15.5)
WBC: 3.5 10*3/uL — ABNORMAL LOW (ref 4.0–10.5)
nRBC: 0 % (ref 0.0–0.2)

## 2023-03-18 LAB — COMPREHENSIVE METABOLIC PANEL
ALT: 19 U/L (ref 0–44)
AST: 19 U/L (ref 15–41)
Albumin: 3.8 g/dL (ref 3.5–5.0)
Alkaline Phosphatase: 68 U/L (ref 38–126)
Anion gap: 5 (ref 5–15)
BUN: 27 mg/dL — ABNORMAL HIGH (ref 8–23)
CO2: 27 mmol/L (ref 22–32)
Calcium: 8.3 mg/dL — ABNORMAL LOW (ref 8.9–10.3)
Chloride: 105 mmol/L (ref 98–111)
Creatinine, Ser: 1.53 mg/dL — ABNORMAL HIGH (ref 0.44–1.00)
GFR, Estimated: 35 mL/min — ABNORMAL LOW (ref >60)
Glucose, Bld: 203 mg/dL — ABNORMAL HIGH (ref 70–99)
Potassium: 4.2 mmol/L (ref 3.5–5.1)
Sodium: 137 mmol/L (ref 135–145)
Total Bilirubin: 0.7 mg/dL (ref 0.3–1.2)
Total Protein: 6.1 g/dL — ABNORMAL LOW (ref 6.5–8.1)

## 2023-03-18 MED ORDER — SODIUM CHLORIDE 0.9% FLUSH
10.0000 mL | Freq: Once | INTRAVENOUS | Status: AC
  Administered 2023-03-18: 10 mL via INTRAVENOUS

## 2023-03-18 MED ORDER — HEPARIN SOD (PORK) LOCK FLUSH 100 UNIT/ML IV SOLN
500.0000 [IU] | Freq: Once | INTRAVENOUS | Status: AC
  Administered 2023-03-18: 500 [IU] via INTRAVENOUS

## 2023-03-18 MED ORDER — DARATUMUMAB-HYALURONIDASE-FIHJ 1800-30000 MG-UT/15ML ~~LOC~~ SOLN
1800.0000 mg | Freq: Once | SUBCUTANEOUS | Status: AC
  Administered 2023-03-18: 1800 mg via SUBCUTANEOUS
  Filled 2023-03-18: qty 15

## 2023-03-18 MED ORDER — BORTEZOMIB CHEMO SQ INJECTION 3.5 MG (2.5MG/ML)
1.0000 mg/m2 | Freq: Once | INTRAMUSCULAR | Status: AC
  Administered 2023-03-18: 2 mg via SUBCUTANEOUS
  Filled 2023-03-18: qty 0.8

## 2023-03-18 NOTE — Patient Instructions (Signed)
[facility]  Discharge Instructions: Thank you for choosing [facility] to provide your oncology and hematology care.  If you have a lab appointment with the Cancer Center - please note that after April 8th, 2024, all labs will be drawn in the cancer center.  You do not have to check in or register with the main entrance as you have in the past but will complete your check-in in the cancer center.  Wear comfortable clothing and clothing appropriate for easy access to any Portacath or PICC line.   We strive to give you quality time with your provider. You may need to reschedule your appointment if you arrive late (15 or more minutes).  Arriving late affects you and other patients whose appointments are after yours.  Also, if you miss three or more appointments without notifying the office, you may be dismissed from the clinic at the provider's discretion.      For prescription refill requests, have your pharmacy contact our office and allow 72 hours for refills to be completed.    Today you received the following chemotherapy and/or immunotherapy agents Daratumumab/Velcade      To help prevent nausea and vomiting after your treatment, we encourage you to take your nausea medication as directed.  BELOW ARE SYMPTOMS THAT SHOULD BE REPORTED IMMEDIATELY: *FEVER GREATER THAN 100.4 F (38 C) OR HIGHER *CHILLS OR SWEATING *NAUSEA AND VOMITING THAT IS NOT CONTROLLED WITH YOUR NAUSEA MEDICATION *UNUSUAL SHORTNESS OF BREATH *UNUSUAL BRUISING OR BLEEDING *URINARY PROBLEMS (pain or burning when urinating, or frequent urination) *BOWEL PROBLEMS (unusual diarrhea, constipation, pain near the anus) TENDERNESS IN MOUTH AND THROAT WITH OR WITHOUT PRESENCE OF ULCERS (sore throat, sores in mouth, or a toothache) UNUSUAL RASH, SWELLING OR PAIN  UNUSUAL VAGINAL DISCHARGE OR ITCHING   Items with * indicate a potential emergency and should be followed up as soon as possible or  go to the Emergency Department if any problems should occur.  Please show the CHEMOTHERAPY ALERT CARD or IMMUNOTHERAPY ALERT CARD at check-in to the Emergency Department and triage nurse.  Should you have questions after your visit or need to cancel or reschedule your appointment, please contact [facility] [phone]  and follow the prompts.  Office hours are 8:00 a.m. to 4:30 p.m. Monday - Friday. Please note that voicemails left after 4:00 p.m. may not be returned until the following business day.  We are closed weekends and major holidays. You have access to a nurse at all times for urgent questions. Please call the main number to the clinic [phone] and follow the prompts.  For any non-urgent questions, you may also contact your provider using MyChart. We now offer e-Visits for anyone 11 and older to request care online for non-urgent symptoms. For details visit [email or web address].   Also download the MyChart app! Go to the app store, search "MyChart", open the app, select [place], and log in with your MyChart username and password.

## 2023-03-18 NOTE — Progress Notes (Signed)
Patient presents today for Daratumumab and Velcade injections per providers order.  Patient took premedications at home prior to appointment.  Vital signs within parameters for treatment.  Labs within parameters for treatment.  Daratumumab/Velcade administration without incident; injection site WNL; see MAR for injection details.  Patient tolerated procedure well and without incident.  No questions or complaints noted at this time.  Discharge from clinic ambulatory in stable condition.  Alert and oriented X 3.  Follow up with [facility] as scheduled.

## 2023-03-19 ENCOUNTER — Other Ambulatory Visit: Payer: Self-pay

## 2023-03-19 DIAGNOSIS — C9000 Multiple myeloma not having achieved remission: Secondary | ICD-10-CM

## 2023-03-19 MED ORDER — HYDROCODONE-ACETAMINOPHEN 10-325 MG PO TABS
1.0000 | ORAL_TABLET | Freq: Four times a day (QID) | ORAL | 0 refills | Status: DC | PRN
Start: 2023-03-19 — End: 2023-04-17

## 2023-03-30 ENCOUNTER — Other Ambulatory Visit: Payer: Self-pay

## 2023-04-01 ENCOUNTER — Inpatient Hospital Stay: Payer: 59

## 2023-04-01 ENCOUNTER — Inpatient Hospital Stay: Payer: 59 | Attending: Hematology

## 2023-04-01 VITALS — BP 138/75 | HR 76 | Temp 96.9°F | Resp 18 | Wt 190.4 lb

## 2023-04-01 DIAGNOSIS — D696 Thrombocytopenia, unspecified: Secondary | ICD-10-CM | POA: Diagnosis not present

## 2023-04-01 DIAGNOSIS — Z7901 Long term (current) use of anticoagulants: Secondary | ICD-10-CM | POA: Insufficient documentation

## 2023-04-01 DIAGNOSIS — I2699 Other pulmonary embolism without acute cor pulmonale: Secondary | ICD-10-CM | POA: Diagnosis not present

## 2023-04-01 DIAGNOSIS — Z5112 Encounter for antineoplastic immunotherapy: Secondary | ICD-10-CM | POA: Insufficient documentation

## 2023-04-01 DIAGNOSIS — G629 Polyneuropathy, unspecified: Secondary | ICD-10-CM | POA: Diagnosis not present

## 2023-04-01 DIAGNOSIS — F419 Anxiety disorder, unspecified: Secondary | ICD-10-CM | POA: Diagnosis not present

## 2023-04-01 DIAGNOSIS — D649 Anemia, unspecified: Secondary | ICD-10-CM | POA: Insufficient documentation

## 2023-04-01 DIAGNOSIS — C9000 Multiple myeloma not having achieved remission: Secondary | ICD-10-CM

## 2023-04-01 DIAGNOSIS — Z95828 Presence of other vascular implants and grafts: Secondary | ICD-10-CM

## 2023-04-01 LAB — COMPREHENSIVE METABOLIC PANEL
ALT: 20 U/L (ref 0–44)
AST: 19 U/L (ref 15–41)
Albumin: 3.7 g/dL (ref 3.5–5.0)
Alkaline Phosphatase: 67 U/L (ref 38–126)
Anion gap: 7 (ref 5–15)
BUN: 21 mg/dL (ref 8–23)
CO2: 28 mmol/L (ref 22–32)
Calcium: 8.7 mg/dL — ABNORMAL LOW (ref 8.9–10.3)
Chloride: 103 mmol/L (ref 98–111)
Creatinine, Ser: 1.37 mg/dL — ABNORMAL HIGH (ref 0.44–1.00)
GFR, Estimated: 41 mL/min — ABNORMAL LOW (ref >60)
Glucose, Bld: 156 mg/dL — ABNORMAL HIGH (ref 70–99)
Potassium: 4.4 mmol/L (ref 3.5–5.1)
Sodium: 138 mmol/L (ref 135–145)
Total Bilirubin: 0.7 mg/dL (ref 0.3–1.2)
Total Protein: 6 g/dL — ABNORMAL LOW (ref 6.5–8.1)

## 2023-04-01 LAB — CBC WITH DIFFERENTIAL/PLATELET
Abs Immature Granulocytes: 0.02 10*3/uL (ref 0.00–0.07)
Basophils Absolute: 0 10*3/uL (ref 0.0–0.1)
Basophils Relative: 1 %
Eosinophils Absolute: 0.2 10*3/uL (ref 0.0–0.5)
Eosinophils Relative: 4 %
HCT: 31.2 % — ABNORMAL LOW (ref 36.0–46.0)
Hemoglobin: 9.9 g/dL — ABNORMAL LOW (ref 12.0–15.0)
Immature Granulocytes: 1 %
Lymphocytes Relative: 12 %
Lymphs Abs: 0.5 10*3/uL — ABNORMAL LOW (ref 0.7–4.0)
MCH: 33.2 pg (ref 26.0–34.0)
MCHC: 31.7 g/dL (ref 30.0–36.0)
MCV: 104.7 fL — ABNORMAL HIGH (ref 80.0–100.0)
Monocytes Absolute: 0.4 10*3/uL (ref 0.1–1.0)
Monocytes Relative: 10 %
Neutro Abs: 3.1 10*3/uL (ref 1.7–7.7)
Neutrophils Relative %: 72 %
Platelets: 117 10*3/uL — ABNORMAL LOW (ref 150–400)
RBC: 2.98 MIL/uL — ABNORMAL LOW (ref 3.87–5.11)
RDW: 12.1 % (ref 11.5–15.5)
WBC: 4.3 10*3/uL (ref 4.0–10.5)
nRBC: 0 % (ref 0.0–0.2)

## 2023-04-01 MED ORDER — BORTEZOMIB CHEMO SQ INJECTION 3.5 MG (2.5MG/ML)
1.0000 mg/m2 | Freq: Once | INTRAMUSCULAR | Status: AC
  Administered 2023-04-01: 2 mg via SUBCUTANEOUS
  Filled 2023-04-01: qty 0.8

## 2023-04-01 MED ORDER — DEXAMETHASONE 4 MG PO TABS
20.0000 mg | ORAL_TABLET | Freq: Once | ORAL | Status: DC
  Filled 2023-04-01: qty 5

## 2023-04-01 MED ORDER — SODIUM CHLORIDE 0.9% FLUSH
10.0000 mL | INTRAVENOUS | Status: DC | PRN
  Administered 2023-04-01: 10 mL via INTRAVENOUS

## 2023-04-01 NOTE — Progress Notes (Signed)
Patients port flushed without difficulty.  Good blood return noted with no bruising or swelling noted at site.  Band aid applied.  VSS with discharge and left in satisfactory condition with no s/s of distress noted.   

## 2023-04-01 NOTE — Progress Notes (Signed)
Patient presents today for Velcade injection.  Patient is in satisfactory condition with no new complaints voiced.  Vital signs are stable.  Labs reviewed and all labs are within treatment parameters.  Decadron 20 mg taken at home prior to visit.  We will proceed with treatment per MD orders.    Patient tolerated injection with no complaints voiced.  Site clean and dry with no bruising or swelling noted.  No complaints of pain.  Discharged with vital signs stable and no signs or symptoms of distress noted.

## 2023-04-01 NOTE — Patient Instructions (Signed)
[facility]  Discharge Instructions: Thank you for choosing [facility] to provide your oncology and hematology care.  If you have a lab appointment with the Cancer Center - please note that after April 8th, 2024, all labs will be drawn in the cancer center.  You do not have to check in or register with the main entrance as you have in the past but will complete your check-in in the cancer center.  Wear comfortable clothing and clothing appropriate for easy access to any Portacath or PICC line.   We strive to give you quality time with your provider. You may need to reschedule your appointment if you arrive late (15 or more minutes).  Arriving late affects you and other patients whose appointments are after yours.  Also, if you miss three or more appointments without notifying the office, you may be dismissed from the clinic at the provider's discretion.      For prescription refill requests, have your pharmacy contact our office and allow 72 hours for refills to be completed.    Today you received the following chemotherapy and/or immunotherapy agents Velcade. Bortezomib Injection What is this medication? BORTEZOMIB (bor TEZ oh mib) treats lymphoma. It may also be used to treat multiple myeloma, a type of bone marrow cancer. It works by blocking a protein that causes cancer cells to grow and multiply. This helps to slow or stop the spread of cancer cells. This medicine may be used for other purposes; ask your health care provider or pharmacist if you have questions. COMMON BRAND NAME(S): Velcade What should I tell my care team before I take this medication? They need to know if you have any of these conditions: Dehydration Diabetes Heart disease Liver disease Tingling of the fingers or toes or other nerve disorder An unusual or allergic reaction to bortezomib, other medications, foods, dyes, or preservatives If you or your partner are pregnant or trying to  get pregnant Breastfeeding How should I use this medication? This medication is injected into a vein or under the skin. It is given by your care team in a hospital or clinic setting. Talk to your care team about the use of this medication in children. Special care may be needed. Overdosage: If you think you have taken too much of this medicine contact a poison control center or emergency room at once. NOTE: This medicine is only for you. Do not share this medicine with others. What if I miss a dose? Keep appointments for follow-up doses. It is important not to miss your dose. Call your care team if you are unable to keep an appointment. What may interact with this medication? Ketoconazole Rifampin This list may not describe all possible interactions. Give your health care provider a list of all the medicines, herbs, non-prescription drugs, or dietary supplements you use. Also tell them if you smoke, drink alcohol, or use illegal drugs. Some items may interact with your medicine. What should I watch for while using this medication? Your condition will be monitored carefully while you are receiving this medication. You may need blood work while taking this medication. This medication may affect your coordination, reaction time, or judgment. Do not drive or operate machinery until you know how this medication affects you. Sit up or stand slowly to reduce the risk of dizzy or fainting spells. Drinking alcohol with this medication can increase the risk of these side effects. This medication may increase your risk of getting an infection. Call   your care team for advice if you get a fever, chills, sore throat, or other symptoms of a cold or flu. Do not treat yourself. Try to avoid being around people who are sick. Check with your care team if you have severe diarrhea, nausea, and vomiting, or if you sweat a lot. The loss of too much body fluid may make it dangerous for you to take this medication. Talk to  your care team if you may be pregnant. Serious birth defects can occur if you take this medication during pregnancy and for 7 months after the last dose. You will need a negative pregnancy test before starting this medication. Contraception is recommended while taking this medication and for 7 months after the last dose. Your care team can help you find the option that works for you. If your partner can get pregnant, use a condom during sex while taking this medication and for 4 months after the last dose. Do not breastfeed while taking this medication and for 2 months after the last dose. This medication may cause infertility. Talk to your care team if you are concerned about your fertility. What side effects may I notice from receiving this medication? Side effects that you should report to your care team as soon as possible: Allergic reactions--skin rash, itching, hives, swelling of the face, lips, tongue, or throat Bleeding--bloody or black, tar-like stools, vomiting blood or brown material that looks like coffee grounds, red or dark brown urine, small red or purple spots on skin, unusual bruising or bleeding Bleeding in the brain--severe headache, stiff neck, confusion, dizziness, change in vision, numbness or weakness of the face, arm, or leg, trouble speaking, trouble walking, vomiting Bowel blockage--stomach cramping, unable to have a bowel movement or pass gas, loss of appetite, vomiting Heart failure--shortness of breath, swelling of the ankles, feet, or hands, sudden weight gain, unusual weakness or fatigue Infection--fever, chills, cough, sore throat, wounds that don't heal, pain or trouble when passing urine, general feeling of discomfort or being unwell Liver injury--right upper belly pain, loss of appetite, nausea, light-colored stool, dark yellow or brown urine, yellowing skin or eyes, unusual weakness or fatigue Low blood pressure--dizziness, feeling faint or lightheaded, blurry  vision Lung injury--shortness of breath or trouble breathing, cough, spitting up blood, chest pain, fever Pain, tingling, or numbness in the hands or feet Severe or prolonged diarrhea Stomach pain, bloody diarrhea, pale skin, unusual weakness or fatigue, decrease in the amount of urine, which may be signs of hemolytic uremic syndrome Sudden and severe headache, confusion, change in vision, seizures, which may be signs of posterior reversible encephalopathy syndrome (PRES) TTP--purple spots on the skin or inside the mouth, pale skin, yellowing skin or eyes, unusual weakness or fatigue, fever, fast or irregular heartbeat, confusion, change in vision, trouble speaking, trouble walking Tumor lysis syndrome (TLS)--nausea, vomiting, diarrhea, decrease in the amount of urine, dark urine, unusual weakness or fatigue, confusion, muscle pain or cramps, fast or irregular heartbeat, joint pain Side effects that usually do not require medical attention (report to your care team if they continue or are bothersome): Constipation Diarrhea Fatigue Loss of appetite Nausea This list may not describe all possible side effects. Call your doctor for medical advice about side effects. You may report side effects to FDA at 1-800-FDA-1088. Where should I keep my medication? This medication is given in a hospital or clinic. It will not be stored at home. NOTE: This sheet is a summary. It may not cover all possible information. If   you have questions about this medicine, talk to your doctor, pharmacist, or health care provider.  2024 Elsevier/Gold Standard (2022-01-09 00:00:00)        To help prevent nausea and vomiting after your treatment, we encourage you to take your nausea medication as directed.  BELOW ARE SYMPTOMS THAT SHOULD BE REPORTED IMMEDIATELY: *FEVER GREATER THAN 100.4 F (38 C) OR HIGHER *CHILLS OR SWEATING *NAUSEA AND VOMITING THAT IS NOT CONTROLLED WITH YOUR NAUSEA MEDICATION *UNUSUAL SHORTNESS OF  BREATH *UNUSUAL BRUISING OR BLEEDING *URINARY PROBLEMS (pain or burning when urinating, or frequent urination) *BOWEL PROBLEMS (unusual diarrhea, constipation, pain near the anus) TENDERNESS IN MOUTH AND THROAT WITH OR WITHOUT PRESENCE OF ULCERS (sore throat, sores in mouth, or a toothache) UNUSUAL RASH, SWELLING OR PAIN  UNUSUAL VAGINAL DISCHARGE OR ITCHING   Items with * indicate a potential emergency and should be followed up as soon as possible or go to the Emergency Department if any problems should occur.  Please show the CHEMOTHERAPY ALERT CARD or IMMUNOTHERAPY ALERT CARD at check-in to the Emergency Department and triage nurse.  Should you have questions after your visit or need to cancel or reschedule your appointment, please contact [facility] [phone]  and follow the prompts.  Office hours are 8:00 a.m. to 4:30 p.m. Monday - Friday. Please note that voicemails left after 4:00 p.m. may not be returned until the following business day.  We are closed weekends and major holidays. You have access to a nurse at all times for urgent questions. Please call the main number to the clinic [phone] and follow the prompts.  For any non-urgent questions, you may also contact your provider using MyChart. We now offer e-Visits for anyone 18 and older to request care online for non-urgent symptoms. For details visit [email or web address].   Also download the MyChart app! Go to the app store, search "MyChart", open the app, select [place], and log in with your MyChart username and password.   

## 2023-04-02 ENCOUNTER — Other Ambulatory Visit: Payer: Self-pay | Admitting: *Deleted

## 2023-04-02 MED ORDER — GLIPIZIDE 5 MG PO TABS: 5.0000 mg | ORAL_TABLET | Freq: Every day | ORAL | 5 refills | Status: DC

## 2023-04-08 ENCOUNTER — Other Ambulatory Visit: Payer: Self-pay

## 2023-04-08 ENCOUNTER — Other Ambulatory Visit: Payer: Self-pay | Admitting: *Deleted

## 2023-04-08 DIAGNOSIS — C9000 Multiple myeloma not having achieved remission: Secondary | ICD-10-CM

## 2023-04-08 MED ORDER — ZOLPIDEM TARTRATE 10 MG PO TABS: 10.0000 mg | ORAL_TABLET | Freq: Every evening | ORAL | 0 refills | Status: DC | PRN

## 2023-04-14 ENCOUNTER — Other Ambulatory Visit: Payer: Self-pay

## 2023-04-14 NOTE — Progress Notes (Signed)
[facility] [address] [place] [address]    Clinic Day:  04/15/2023  Referring physician: Alvina Filbert, MD  Patient Care Team: Alvina Filbert, MD as PCP - General (Internal Medicine) Doreatha Massed, MD as Medical Oncologist (Medical Oncology) Lanelle Bal, DO as Consulting Physician (Gastroenterology)   ASSESSMENT & PLAN:   Assessment: 1.  IgG lambda plasma cell myeloma, stage II, standard risk: -4 cycles of KPD from 04/17/2018 through 08/21/2018, stem cell transplant on 10/23/2018. -PET scan on 01/21/2019 showed multiple bone lesions but without any hypermetabolic activity. -BMBX on 01/21/2019 with normocellular marrow with no increase in plasma cells.  Normal FISH.  MRD results negative. -Maintenance pomalidomide 2 mg 3 weeks on/1 week off started on 03/10/2019. -Bone marrow biopsy on 10/29/2019 shows trilineage hematopoiesis with no evidence of plasma cells.  Chromosome analysis and FISH are normal. -BM BX on 10/27/2020 at [facility]-normocellular marrow with 30 to 40%, TLH.  2% of the total cells are CD 138+ plasma cells. - Pomalyst was held due to dizziness from 03/29/2021 through 04/26/2021. - MRI of the brain on 03/31/2021 did not show any evidence of intracranial lesions.  Multiple bone lesions in the skull compatible with sequela of myeloma.  - Labs at [facility] on 11/16/2021: M spike 0.24 g.  Immunofixation positive for IgG lambda. - PET scan on 12/14/2021: No evidence of FDG avid osseous or soft tissue myeloma. - She has developed left shoulder blade and right posterior rib pain for the last 3 to 4 months. - 24-hour urine on 12/14/2021: Total protein 136 mg.  Immune immunofixation positive for lambda type Bence-Jones protein. -- PET scan (05/24/2022): Single new hypermetabolic lytic lesion involving left lamina of T11.  No canal encroachment.  No other hypermetabolic bone lesions.  Stable diffuse lytic lesions throughout the axial and appendicular skeleton.  No  soft tissue lesions. - Maintenance Pomalyst discontinued on 05/29/2022 - XRT to the T11 completed on 06/20/2022 - Daratumumab, Velcade and dexamethasone started on 06/11/2022   2.  Pulmonary embolism: -CT angiogram on 04/21/2020 showed filling defect at [facility]. -VQ scan confirmed pulmonary embolism. -She is on Eliquis.   3.  Right shoulder and upper arm pain: - She received XRT to the chest from 12/26/2020 through 01/06/2021.   4.  Myeloma bone disease: - Denosumab held due to exposure of bone in the right lower jaw.  Last dose in 2020.    Plan: 1.  IgG lambda plasma cell myeloma: - She is tolerating Velcade and Darzalex reasonably well.  She denies any infections. - Reviewed myeloma labs from 04/01/2023: M spike is stable at 0.1 g.  Free light chain ratio is 0.24 with lambda light chains 15.8. - Labs today: Normal CBC with mild thrombocytopenia and anemia.  Renal function is stable at 1.4. - Continue Velcade every 2 weeks and Darzalex once a month with dexamethasone 20 mg. - RTC 8 weeks with repeat myeloma labs.   2.  Back pain/left shoulder blade pain/right lateral and posterior rib pains: - Continue hydrocodone 10/325 every 6 hours as needed.  Pain is well controlled.   3.  Hypomagnesemia: - Continue magnesium twice daily.  Magnesium is normal at 1.9.   4.  Anxiety: - Continue Xanax 0.5 mg twice daily.   5.  Sleeping difficulty: - Continue Ambien daily.   6.  Peripheral neuropathy: - Continue gabapentin 300 mg 3 times daily.   7.  Pulmonary embolism: - Continue Eliquis twice daily.  No bleeding issues.    No orders of the defined types were placed in this encounter.     I,Katie Daubenspeck,acting as a Neurosurgeon for Doreatha Massed, MD.,have documented all relevant documentation on the behalf of Doreatha Massed, MD,as directed by  Doreatha Massed, MD while in the presence of Doreatha Massed, MD.   I, Doreatha Massed MD, have reviewed the  above documentation for accuracy and completeness, and I agree with the above.   Doreatha Massed, MD   8/26/20245:30 PM  CHIEF COMPLAINT:   Diagnosis: multiple myeloma    Cancer Staging  No matching staging information was found for the patient.    Prior Therapy: 1. KPD x 4 cycles from 04/17/2018 to 08/21/2018. 2. Stem cell transplant on 10/23/2018. 3. Radiation to left and right humerus 25 Gy in 10 fractions from 08/23/2020 to 08/25/2020. 4.  Maintenance Pomalyst until 06/08/2022  Current Therapy:  Darzalex, Velcade and dexamethasone started on 06/11/2022    HISTORY OF PRESENT ILLNESS:   Oncology History  Multiple myeloma not having achieved remission (HCC)  04/07/2018 Initial Diagnosis   Multiple myeloma not having achieved remission (HCC)   06/11/2022 -  Chemotherapy   Patient is on Treatment Plan : MYELOMA RELAPSED / REFRACTORY Daratumumab SQ + Bortezomib + Dexamethasone (DaraVd) q21d / Daratumumab SQ q28d      Multiple myeloma without remission (HCC)  04/15/2018 Initial Diagnosis   Multiple myeloma without remission (HCC)   04/17/2018 - 09/05/2018 Chemotherapy   The patient had dexamethasone (DECADRON) 4 MG tablet, 1 of 1 cycle, Start date: 04/15/2018, End date: 05/26/2018 palonosetron (ALOXI) injection 0.25 mg, 0.25 mg, Intravenous,  Once, 1 of 1 cycle Administration: 0.25 mg (04/17/2018), 0.25 mg (04/24/2018), 0.25 mg (05/01/2018) cyclophosphamide (CYTOXAN) 540 mg in sodium chloride 0.9 % 250 mL chemo infusion, 300 mg/m2 = 540 mg, Intravenous,  Once, 1 of 1 cycle Administration: 540 mg (04/17/2018), 540 mg (04/24/2018), 540 mg (05/01/2018) carfilzomib (KYPROLIS) 36 mg in dextrose 5 % 50 mL chemo infusion, 20 mg/m2 = 36 mg, Intravenous, Once, 5 of 5 cycles Administration: 36 mg (04/17/2018), 36 mg (04/18/2018), 60 mg (04/24/2018), 60 mg (04/25/2018), 60 mg (05/01/2018), 60 mg (05/02/2018), 60 mg (05/15/2018), 60 mg (05/16/2018), 60 mg (06/12/2018), 60 mg (06/13/2018), 60 mg (06/26/2018), 60  mg (06/27/2018), 60 mg (07/03/2018), 60 mg (07/04/2018), 60 mg (07/24/2018), 60 mg (07/25/2018), 60 mg (07/31/2018), 60 mg (08/01/2018), 60 mg (08/08/2018), 60 mg (08/07/2018), 60 mg (08/21/2018), 60 mg (08/22/2018), 60 mg (08/28/2018), 60 mg (08/29/2018), 60 mg (09/04/2018), 60 mg (09/05/2018)  for chemotherapy treatment.    06/11/2022 -  Chemotherapy   Patient is on Treatment Plan : MYELOMA RELAPSED / REFRACTORY Daratumumab SQ + Bortezomib + Dexamethasone (DaraVd) q21d / Daratumumab SQ q28d         INTERVAL HISTORY:   Alexandra Price is a 77 y.o. female presenting to clinic today for follow up of multiple myeloma. She was last seen by me on 02/18/23.  Of note since her last visit, she underwent brain MRI on 03/11/23 showing: no evidence of acute intracranial abnormality; multiple calvarial lesions, similar to prior MRI.  Today, she states that she is doing well overall. Her appetite level is at 100%. Her energy level is at 40%.  PAST MEDICAL HISTORY:   Past Medical History: Past Medical History:  Diagnosis Date   Anxiety    Chronic kidney disease    Depression    Hypertension    Multiple myeloma (HCC)    multiple myeloma   Pre-diabetes  Surgical History: Past Surgical History:  Procedure Laterality Date   ABDOMINAL HYSTERECTOMY     total   APPENDECTOMY     BALLOON DILATION N/A 05/28/2022   Procedure: BALLOON DILATION;  Surgeon: Lanelle Bal, DO;  [facility]: [facility];  Service: Endoscopy;  Laterality: N/A;   BIOPSY  05/28/2022   Procedure: BIOPSY;  Surgeon: Lanelle Bal, DO;  [facility]: [facility];  Service: Endoscopy;;   COLONOSCOPY WITH PROPOFOL N/A 12/25/2021   Procedure: COLONOSCOPY WITH PROPOFOL;  Surgeon: Lanelle Bal, DO;  [facility]: [facility];  Service: Endoscopy;  Laterality: N/A;  2:30pm   ESOPHAGOGASTRODUODENOSCOPY (EGD) WITH PROPOFOL N/A 05/28/2022   Procedure: ESOPHAGOGASTRODUODENOSCOPY (EGD) WITH PROPOFOL;  Surgeon: Lanelle Bal, DO;  [facility];  Service: Endoscopy;  Laterality: N/A;  10:00am, asa 3   LAPAROSCOPIC APPENDECTOMY N/A 05/20/2018   Procedure: APPENDECTOMY LAPAROSCOPIC;  Surgeon: Franky Macho, MD;  Location: [facility];  Service: General;  Laterality: N/A;   POLYPECTOMY  12/25/2021   Procedure: POLYPECTOMY;  Surgeon: Lanelle Bal, DO;  [facility]: [facility];  Service: Endoscopy;;   PORTACATH PLACEMENT Right 04/14/2018   Procedure: INSERTION PORT-A-CATH;  Surgeon: Franky Macho, MD;  Location: [facility];  Service: General;  Laterality: Right;    Social History: Social History   Socioeconomic History   Marital status: Divorced    Spouse name: Not on file   Number of children: 6   Years of education: Not on file   Highest education level: Not on file  Occupational History    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  Tobacco Use   Smoking status: Never   Smokeless tobacco: Never  Vaping Use   Vaping status: Never Used  Substance and Sexual Activity   Alcohol use: Never   Drug use: Not Currently   Sexual activity: Not Currently  Other Topics Concern   Not on file  Social History Narrative   Not on file   Social Determinants of Health   Financial Resource Strain: Low Risk  (07/06/2020)   Overall Financial Resource Strain (CARDIA)    Difficulty of Paying Living Expenses: Not hard at all  Food Insecurity: No Food Insecurity (09/17/2022)   Hunger Vital Sign    Worried About Running Out of Food in the Last Year: Never true    Ran Out of Food in the Last Year: Never true  Transportation Needs: No Transportation Needs (09/17/2022)   PRAPARE - Administrator, Civil Service (Medical): No    Lack of Transportation (Non-Medical): No  Physical Activity: Inactive (07/06/2020)   Exercise Vital Sign    Days of Exercise per Week: 0 days    Minutes of Exercise per Session: 0 min  Stress: No Stress Concern Present (07/06/2020)   [employer] of Occupational Health - Occupational Stress Questionnaire     Feeling of Stress : Not at all  Social Connections: Moderately Isolated (07/06/2020)   Social Connection and Isolation Panel [NHANES]    Frequency of Communication with Friends and Family: More than three times a week    Frequency of Social Gatherings with Friends and Family: Twice a week    Attends Religious Services: 1 to 4 times per year    Active Member of [employer] or Organizations: No    Attends Banker Meetings: Never    Marital Status: Separated  Intimate Partner Violence: Not At Risk (09/17/2022)   Humiliation, Afraid, Rape, and Kick questionnaire    Fear of Current or  Ex-Partner: No    Emotionally Abused: No    Physically Abused: No    Sexually Abused: No    Family History: Family History  Problem Relation Age of Onset   Heart disease Mother    Emphysema Father    Diabetes Sister    Depression Sister    Cancer Brother        liver, lung, and colon. colon cancer at age 62.   Diabetes Brother     Current Medications:  Current Outpatient Medications:    acetaminophen (TYLENOL) 325 MG tablet, Take 650 mg by mouth as needed for moderate pain (prior to infusion)., Disp: , Rfl:    albuterol (PROVENTIL) (2.5 MG/3ML) 0.083% nebulizer solution, Take 3 mLs (2.5 mg total) by nebulization every 4 (four) hours as needed for wheezing or shortness of breath., Disp: 75 mL, Rfl: 2   albuterol (VENTOLIN HFA) 108 (90 Base) MCG/ACT inhaler, Inhale 2 puffs into the lungs every 4 (four) hours as needed for wheezing or shortness of breath., Disp: 18 g, Rfl: 2   ALPRAZolam (XANAX) 0.25 MG tablet, Take one tablet (0.25 mg) by mouth in the morning as needed, and two tablets (0.5 mg) at bedtime as needed, Disp: 90 tablet, Rfl: 2   Calcium Carb-Cholecalciferol (CALCIUM 1000 + D PO), Take 1,000 mg by mouth daily., Disp: , Rfl:    chlorthalidone (HYGROTON) 25 MG tablet, Take 25 mg by mouth every morning., Disp: , Rfl:    diphenhydrAMINE (BENADRYL) 25 MG tablet, Take 50 mg by mouth as needed  (prior to infusion)., Disp: , Rfl:    ELIQUIS 2.5 MG TABS tablet, Take 2.5 mg by mouth 2 (two) times daily., Disp: , Rfl:    escitalopram (LEXAPRO) 20 MG tablet, Take 1 tablet (20 mg total) by mouth daily., Disp: 30 tablet, Rfl: 3   gabapentin (NEURONTIN) 300 MG capsule, Take 1 capsule by mouth twice daily, Disp: 60 capsule, Rfl: 5   glipiZIDE (GLUCOTROL) 5 MG tablet, Take 1 tablet (5 mg total) by mouth daily before breakfast., Disp: 30 tablet, Rfl: 5   HYDROcodone-acetaminophen (NORCO) 10-325 MG tablet, Take 1 tablet by mouth every 6 (six) hours as needed., Disp: 120 tablet, Rfl: 0   MAGnesium-Oxide 400 (240 Mg) MG tablet, Take 1 tablet by mouth twice daily, Disp: 120 tablet, Rfl: 4   meclizine (ANTIVERT) 12.5 MG tablet, Take 12.5 mg by mouth daily., Disp: , Rfl:    midodrine (PROAMATINE) 2.5 MG tablet, Take 1 tablet (2.5 mg total) by mouth 3 (three) times daily with meals., Disp: 90 tablet, Rfl: 1   montelukast (SINGULAIR) 10 MG tablet, TAKE 1 TABLET BY MOUTH AS DIRECTED 30 MINUTES PRIOR TO TREATMENT, Disp: 90 tablet, Rfl: 0   Multiple Vitamin (MULTI VITAMIN) TABS, Take 1 tablet by mouth daily., Disp: , Rfl:    pantoprazole (PROTONIX) 40 MG tablet, Take 1 tablet (40 mg total) by mouth daily., Disp: 90 tablet, Rfl: 6   polyethylene glycol (MIRALAX / GLYCOLAX) 17 g packet, Take 17 g by mouth daily as needed for moderate constipation., Disp: 14 each, Rfl: 0   prochlorperazine (COMPAZINE) 10 MG tablet, Take 1 tablet (10 mg total) by mouth every 6 (six) hours as needed for nausea or vomiting., Disp: 30 tablet, Rfl: 0   rosuvastatin (CRESTOR) 10 MG tablet, Take 10 mg by mouth daily., Disp: , Rfl:    zaleplon (SONATA) 10 MG capsule, Take 1 capsule (10 mg total) by mouth at bedtime as needed for sleep., Disp: 30 capsule,  Rfl: 3   zolpidem (AMBIEN) 10 MG tablet, Take 1 tablet (10 mg total) by mouth at bedtime as needed., Disp: 30 tablet, Rfl: 0   acyclovir (ZOVIRAX) 400 MG tablet, Take 1 tablet (400 mg  total) by mouth 2 (two) times daily., Disp: 60 tablet, Rfl: 6   dexAMETHasone 20 MG TABS, Take 1 tablet by mouth as directed. Take 1 tablet 30 minutes prior to infusions, Disp: 30 tablet, Rfl: 2 No current facility-administered medications for this visit.  Facility-Administered Medications Ordered in Other Visits:    acetaminophen (TYLENOL) tablet 650 mg, 650 mg, Oral, Once, Doreatha Massed, MD   diphenhydrAMINE (BENADRYL) capsule 50 mg, 50 mg, Oral, Once, Doreatha Massed, MD   Allergies: Allergies  Allergen Reactions   Ciprofloxacin Anaphylaxis   Amoxicillin Other (See Comments)    unknown   Morphine And Codeine Nausea And Vomiting   Augmentin [Amoxicillin-Pot Clavulanate] Other (See Comments)    Headache, insomnia    REVIEW OF SYSTEMS:   Review of Systems  Constitutional:  Negative for chills, fatigue and fever.  HENT:   Negative for lump/mass, mouth sores, nosebleeds, sore throat and trouble swallowing.   Eyes:  Negative for eye problems.  Respiratory:  Positive for shortness of breath. Negative for cough.   Cardiovascular:  Negative for chest pain, leg swelling and palpitations.  Gastrointestinal:  Negative for abdominal pain, constipation, diarrhea, nausea and vomiting.  Genitourinary:  Negative for bladder incontinence, difficulty urinating, dysuria, frequency, hematuria and nocturia.   Musculoskeletal:  Negative for arthralgias, back pain, flank pain, myalgias and neck pain.  Skin:  Negative for itching and rash.  Neurological:  Positive for dizziness, headaches and numbness.  Hematological:  Does not bruise/bleed easily.  Psychiatric/Behavioral:  Negative for depression, sleep disturbance and suicidal ideas. The patient is not nervous/anxious.   All other systems reviewed and are negative.    VITALS:   There were no vitals taken for this visit.  Wt Readings from Last 3 Encounters:  04/15/23 188 lb (85.3 kg)  04/01/23 190 lb 6.4 oz (86.4 kg)  03/18/23 187  lb (84.8 kg)    There is no height or weight on file to calculate BMI.  Performance status (ECOG): 1 - Symptomatic but completely ambulatory  PHYSICAL EXAM:   Physical Exam Vitals and nursing note reviewed. Exam conducted with a chaperone present.  Constitutional:      Appearance: Normal appearance.  Cardiovascular:     Rate and Rhythm: Normal rate and regular rhythm.     Pulses: Normal pulses.     Heart sounds: Normal heart sounds.  Pulmonary:     Effort: Pulmonary effort is normal.     Breath sounds: Normal breath sounds.  Abdominal:     Palpations: Abdomen is soft. There is no hepatomegaly, splenomegaly or mass.     Tenderness: There is no abdominal tenderness.  Musculoskeletal:     Right lower leg: No edema.     Left lower leg: No edema.  Lymphadenopathy:     Cervical: No cervical adenopathy.     Right cervical: No superficial, deep or posterior cervical adenopathy.    Left cervical: No superficial, deep or posterior cervical adenopathy.     Upper Body:     Right upper body: No supraclavicular or axillary adenopathy.     Left upper body: No supraclavicular or axillary adenopathy.  Neurological:     General: No focal deficit present.     Mental Status: She is alert and oriented to person, place, and  time.  Psychiatric:        Mood and Affect: Mood normal.        Behavior: Behavior normal.     LABS:      Latest Ref Rng & Units 04/15/2023   10:06 AM 04/01/2023    8:12 AM 03/18/2023    9:56 AM  CBC  WBC 4.0 - 10.5 K/uL 4.0  4.3  3.5   Hemoglobin 12.0 - 15.0 g/dL 78.2  9.9  95.6   Hematocrit 36.0 - 46.0 % 31.6  31.2  31.6   Platelets 150 - 400 K/uL 138  117  122       Latest Ref Rng & Units 04/15/2023   10:06 AM 04/01/2023    8:12 AM 03/18/2023   10:51 AM  CMP  Glucose 70 - 99 mg/dL 213  086  578   BUN 8 - 23 mg/dL 31  21  27    Creatinine 0.44 - 1.00 mg/dL 4.69  6.29  5.28   Sodium 135 - 145 mmol/L 137  138  137   Potassium 3.5 - 5.1 mmol/L 4.5  4.4  4.2    Chloride 98 - 111 mmol/L 101  103  105   CO2 22 - 32 mmol/L 26  28  27    Calcium 8.9 - 10.3 mg/dL 8.8  8.7  8.3   Total Protein 6.5 - 8.1 g/dL 6.7  6.0  6.1   Total Bilirubin 0.3 - 1.2 mg/dL 0.6  0.7  0.7   Alkaline Phos 38 - 126 U/L 79  67  68   AST 15 - 41 U/L 21  19  19    ALT 0 - 44 U/L 24  20  19       No results found for: "CEA1", "CEA" / No results found for: "CEA1", "CEA" No results found for: "PSA1" No results found for: "CAN199" No results found for: "CAN125"  Lab Results  Component Value Date   TOTALPROTELP 5.7 (L) 04/01/2023   ALBUMINELP 3.5 04/01/2023   A1GS 0.2 04/01/2023   A2GS 0.9 04/01/2023   BETS 0.8 04/01/2023   GAMS 0.2 (L) 04/01/2023   MSPIKE 0.1 (H) 04/01/2023   SPEI Comment 04/01/2023   Lab Results  Component Value Date   TIBC 290 09/17/2022   TIBC 278 07/30/2022   TIBC 272 05/24/2022   FERRITIN 1,059 (H) 09/17/2022   FERRITIN 313 (H) 07/30/2022   FERRITIN 435 (H) 05/24/2022   IRONPCTSAT 10 (L) 09/17/2022   IRONPCTSAT 21 07/30/2022   IRONPCTSAT 27 05/24/2022   Lab Results  Component Value Date   LDH 129 05/24/2022   LDH 135 03/20/2022   LDH 129 12/14/2021     STUDIES:   No results found.

## 2023-04-15 ENCOUNTER — Inpatient Hospital Stay: Payer: 59

## 2023-04-15 ENCOUNTER — Inpatient Hospital Stay: Payer: 59 | Admitting: Hematology

## 2023-04-15 ENCOUNTER — Other Ambulatory Visit: Payer: Self-pay

## 2023-04-15 DIAGNOSIS — C9000 Multiple myeloma not having achieved remission: Secondary | ICD-10-CM | POA: Diagnosis not present

## 2023-04-15 DIAGNOSIS — Z5112 Encounter for antineoplastic immunotherapy: Secondary | ICD-10-CM | POA: Diagnosis not present

## 2023-04-15 LAB — CBC WITH DIFFERENTIAL/PLATELET
Abs Immature Granulocytes: 0.01 10*3/uL (ref 0.00–0.07)
Basophils Absolute: 0 10*3/uL (ref 0.0–0.1)
Basophils Relative: 1 %
Eosinophils Absolute: 0.1 10*3/uL (ref 0.0–0.5)
Eosinophils Relative: 3 %
HCT: 31.6 % — ABNORMAL LOW (ref 36.0–46.0)
Hemoglobin: 10.2 g/dL — ABNORMAL LOW (ref 12.0–15.0)
Immature Granulocytes: 0 %
Lymphocytes Relative: 14 %
Lymphs Abs: 0.6 10*3/uL — ABNORMAL LOW (ref 0.7–4.0)
MCH: 32.5 pg (ref 26.0–34.0)
MCHC: 32.3 g/dL (ref 30.0–36.0)
MCV: 100.6 fL — ABNORMAL HIGH (ref 80.0–100.0)
Monocytes Absolute: 0.3 10*3/uL (ref 0.1–1.0)
Monocytes Relative: 8 %
Neutro Abs: 2.9 10*3/uL (ref 1.7–7.7)
Neutrophils Relative %: 74 %
Platelets: 138 10*3/uL — ABNORMAL LOW (ref 150–400)
RBC: 3.14 MIL/uL — ABNORMAL LOW (ref 3.87–5.11)
RDW: 12.2 % (ref 11.5–15.5)
WBC: 4 10*3/uL (ref 4.0–10.5)
nRBC: 0 % (ref 0.0–0.2)

## 2023-04-15 LAB — COMPREHENSIVE METABOLIC PANEL
ALT: 24 U/L (ref 0–44)
AST: 21 U/L (ref 15–41)
Albumin: 4 g/dL (ref 3.5–5.0)
Alkaline Phosphatase: 79 U/L (ref 38–126)
Anion gap: 10 (ref 5–15)
BUN: 31 mg/dL — ABNORMAL HIGH (ref 8–23)
CO2: 26 mmol/L (ref 22–32)
Calcium: 8.8 mg/dL — ABNORMAL LOW (ref 8.9–10.3)
Chloride: 101 mmol/L (ref 98–111)
Creatinine, Ser: 1.44 mg/dL — ABNORMAL HIGH (ref 0.44–1.00)
GFR, Estimated: 38 mL/min — ABNORMAL LOW (ref >60)
Glucose, Bld: 194 mg/dL — ABNORMAL HIGH (ref 70–99)
Potassium: 4.5 mmol/L (ref 3.5–5.1)
Sodium: 137 mmol/L (ref 135–145)
Total Bilirubin: 0.6 mg/dL (ref 0.3–1.2)
Total Protein: 6.7 g/dL (ref 6.5–8.1)

## 2023-04-15 LAB — MAGNESIUM: Magnesium: 1.9 mg/dL (ref 1.7–2.4)

## 2023-04-15 MED ORDER — SODIUM CHLORIDE 0.9% FLUSH
10.0000 mL | Freq: Once | INTRAVENOUS | Status: AC
  Administered 2023-04-15: 10 mL via INTRAVENOUS

## 2023-04-15 MED ORDER — DEXAMETHASONE 4 MG PO TABS
20.0000 mg | ORAL_TABLET | Freq: Once | ORAL | Status: AC
  Administered 2023-04-15: 20 mg via ORAL
  Filled 2023-04-15: qty 5

## 2023-04-15 MED ORDER — DEXAMETHASONE 20 MG PO TABS: 1.0000 | ORAL_TABLET | ORAL | 2 refills | Status: AC

## 2023-04-15 MED ORDER — HEPARIN SOD (PORK) LOCK FLUSH 100 UNIT/ML IV SOLN
500.0000 [IU] | Freq: Once | INTRAVENOUS | Status: AC
  Administered 2023-04-15: 500 [IU] via INTRAVENOUS

## 2023-04-15 MED ORDER — BORTEZOMIB CHEMO SQ INJECTION 3.5 MG (2.5MG/ML)
1.0000 mg/m2 | Freq: Once | INTRAMUSCULAR | Status: AC
  Administered 2023-04-15: 2 mg via SUBCUTANEOUS
  Filled 2023-04-15: qty 0.8

## 2023-04-15 MED ORDER — DIPHENHYDRAMINE HCL 25 MG PO CAPS: 50.0000 mg | ORAL_CAPSULE | Freq: Once | ORAL | Status: DC

## 2023-04-15 MED ORDER — DARATUMUMAB-HYALURONIDASE-FIHJ 1800-30000 MG-UT/15ML ~~LOC~~ SOLN
1800.0000 mg | Freq: Once | SUBCUTANEOUS | Status: AC
  Administered 2023-04-15: 1800 mg via SUBCUTANEOUS
  Filled 2023-04-15: qty 15

## 2023-04-15 MED ORDER — ACYCLOVIR 400 MG PO TABS: 400.0000 mg | ORAL_TABLET | Freq: Two times a day (BID) | ORAL | 6 refills | Status: DC

## 2023-04-15 MED ORDER — ACETAMINOPHEN 325 MG PO TABS: 650.0000 mg | ORAL_TABLET | Freq: Once | ORAL | Status: DC

## 2023-04-15 NOTE — Progress Notes (Signed)
Patient presents today for chemotherapy injections. Patient is in satisfactory condition with no new complaints voiced.  Vital signs are stable.  Labs reviewed by Dr. Ellin Saba during the office visit and all labs are within treatment parameters.  We will proceed with treatment per MD orders.   Patient tolerated treatment well with no complaints voiced.  Patient left ambulatory in stable condition.  Vital signs stable at discharge.  Follow up as scheduled.

## 2023-04-15 NOTE — Progress Notes (Signed)
Patient has been examined by Dr. Katragadda. Vital signs and labs have been reviewed by MD - ANC, Creatinine, LFTs, hemoglobin, and platelets are within treatment parameters per M.D. - pt may proceed with treatment.  Primary RN and pharmacy notified.  

## 2023-04-15 NOTE — Patient Instructions (Signed)

## 2023-04-15 NOTE — Patient Instructions (Signed)
[facility]  Discharge Instructions: Thank you for choosing [facility] to provide your oncology and hematology care.  If you have a lab appointment with the Cancer Center - please note that after April 8th, 2024, all labs will be drawn in the cancer center.  You do not have to check in or register with the main entrance as you have in the past but will complete your check-in in the cancer center.  Wear comfortable clothing and clothing appropriate for easy access to any Portacath or PICC line.   We strive to give you quality time with your provider. You may need to reschedule your appointment if you arrive late (15 or more minutes).  Arriving late affects you and other patients whose appointments are after yours.  Also, if you miss three or more appointments without notifying the office, you may be dismissed from the clinic at the provider's discretion.      For prescription refill requests, have your pharmacy contact our office and allow 72 hours for refills to be completed.    Today you received the following chemotherapy and/or immunotherapy agents Velcade/Daratumumab.  Daratumumab; Hyaluronidase Injection What is this medication? DARATUMUMAB; HYALURONIDASE (dar a toom ue mab; hye al ur ON i dase) treats multiple myeloma, a type of bone marrow cancer. Daratumumab works by blocking a protein that causes cancer cells to grow and multiply. This helps to slow or stop the spread of cancer cells. Hyaluronidase works by increasing the absorption of other medications in the body to help them work better. This medication may also be used treat amyloidosis, a condition that causes the buildup of a protein (amyloid) in your body. It works by reducing the buildup of this protein, which decreases symptoms. It is a combination medication that contains a monoclonal antibody. This medicine may be used for other purposes; ask your health care provider or pharmacist if you have  questions. COMMON BRAND NAME(S): DARZALEX FASPRO What should I tell my care team before I take this medication? They need to know if you have any of these conditions: Heart disease Infection, such as chickenpox, cold sores, herpes, hepatitis B Lung or breathing disease An unusual or allergic reaction to daratumumab, hyaluronidase, other medications, foods, dyes, or preservatives Pregnant or trying to get pregnant Breast-feeding How should I use this medication? This medication is injected under the skin. It is given by your care team in a hospital or clinic setting. Talk to your care team about the use of this medication in children. Special care may be needed. Overdosage: If you think you have taken too much of this medicine contact a poison control center or emergency room at once. NOTE: This medicine is only for you. Do not share this medicine with others. What if I miss a dose? Keep appointments for follow-up doses. It is important not to miss your dose. Call your care team if you are unable to keep an appointment. What may interact with this medication? Interactions have not been studied. This list may not describe all possible interactions. Give your health care provider a list of all the medicines, herbs, non-prescription drugs, or dietary supplements you use. Also tell them if you smoke, drink alcohol, or use illegal drugs. Some items may interact with your medicine. What should I watch for while using this medication? Your condition will be monitored carefully while you are receiving this medication. This medication can cause serious allergic reactions. To reduce your risk, your care team may  give you other medication to take before receiving this one. Be sure to follow the directions from your care team. This medication can affect the results of blood tests to match your blood type. These changes can last for up to 6 months after the final dose. Your care team will do blood tests to  match your blood type before you start treatment. Tell all of your care team that you are being treated with this medication before receiving a blood transfusion. This medication can affect the results of some tests used to determine treatment response; extra tests may be needed to evaluate response. Talk to your care team if you wish to become pregnant or think you are pregnant. This medication can cause serious birth defects if taken during pregnancy and for 3 months after the last dose. A reliable form of contraception is recommended while taking this medication and for 3 months after the last dose. Talk to your care team about effective forms of contraception. Do not breast-feed while taking this medication. What side effects may I notice from receiving this medication? Side effects that you should report to your care team as soon as possible: Allergic reactions--skin rash, itching, hives, swelling of the face, lips, tongue, or throat Heart rhythm changes--fast or irregular heartbeat, dizziness, feeling faint or lightheaded, chest pain, trouble breathing Infection--fever, chills, cough, sore throat, wounds that don't heal, pain or trouble when passing urine, general feeling of discomfort or being unwell Infusion reactions--chest pain, shortness of breath or trouble breathing, feeling faint or lightheaded Sudden eye pain or change in vision such as blurry vision, seeing halos around lights, vision loss Unusual bruising or bleeding Side effects that usually do not require medical attention (report to your care team if they continue or are bothersome): Constipation Diarrhea Fatigue Nausea Pain, tingling, or numbness in the hands or feet Swelling of the ankles, hands, or feet This list may not describe all possible side effects. Call your doctor for medical advice about side effects. You may report side effects to FDA at 1-800-FDA-1088. Where should I keep my medication? This medication is given  in a hospital or clinic. It will not be stored at home. NOTE: This sheet is a summary. It may not cover all possible information. If you have questions about this medicine, talk to your doctor, pharmacist, or health care provider.  2024 Elsevier/Gold Standard (2021-12-12 00:00:00)    Bortezomib Injection What is this medication? BORTEZOMIB (bor TEZ oh mib) treats lymphoma. It may also be used to treat multiple myeloma, a type of bone marrow cancer. It works by blocking a protein that causes cancer cells to grow and multiply. This helps to slow or stop the spread of cancer cells. This medicine may be used for other purposes; ask your health care provider or pharmacist if you have questions. COMMON BRAND NAME(S): Velcade What should I tell my care team before I take this medication? They need to know if you have any of these conditions: Dehydration Diabetes Heart disease Liver disease Tingling of the fingers or toes or other nerve disorder An unusual or allergic reaction to bortezomib, other medications, foods, dyes, or preservatives If you or your partner are pregnant or trying to get pregnant Breastfeeding How should I use this medication? This medication is injected into a vein or under the skin. It is given by your care team in a hospital or clinic setting. Talk to your care team about the use of this medication in children. Special care may be  needed. Overdosage: If you think you have taken too much of this medicine contact a poison control center or emergency room at once. NOTE: This medicine is only for you. Do not share this medicine with others. What if I miss a dose? Keep appointments for follow-up doses. It is important not to miss your dose. Call your care team if you are unable to keep an appointment. What may interact with this medication? Ketoconazole Rifampin This list may not describe all possible interactions. Give your health care provider a list of all the medicines,  herbs, non-prescription drugs, or dietary supplements you use. Also tell them if you smoke, drink alcohol, or use illegal drugs. Some items may interact with your medicine. What should I watch for while using this medication? Your condition will be monitored carefully while you are receiving this medication. You may need blood work while taking this medication. This medication may affect your coordination, reaction time, or judgment. Do not drive or operate machinery until you know how this medication affects you. Sit up or stand slowly to reduce the risk of dizzy or fainting spells. Drinking alcohol with this medication can increase the risk of these side effects. This medication may increase your risk of getting an infection. Call your care team for advice if you get a fever, chills, sore throat, or other symptoms of a cold or flu. Do not treat yourself. Try to avoid being around people who are sick. Check with your care team if you have severe diarrhea, nausea, and vomiting, or if you sweat a lot. The loss of too much body fluid may make it dangerous for you to take this medication. Talk to your care team if you may be pregnant. Serious birth defects can occur if you take this medication during pregnancy and for 7 months after the last dose. You will need a negative pregnancy test before starting this medication. Contraception is recommended while taking this medication and for 7 months after the last dose. Your care team can help you find the option that works for you. If your partner can get pregnant, use a condom during sex while taking this medication and for 4 months after the last dose. Do not breastfeed while taking this medication and for 2 months after the last dose. This medication may cause infertility. Talk to your care team if you are concerned about your fertility. What side effects may I notice from receiving this medication? Side effects that you should report to your care team as soon  as possible: Allergic reactions--skin rash, itching, hives, swelling of the face, lips, tongue, or throat Bleeding--bloody or black, tar-like stools, vomiting blood or Patrina Andreas material that looks like coffee grounds, red or dark Wendelyn Kiesling urine, small red or purple spots on skin, unusual bruising or bleeding Bleeding in the brain--severe headache, stiff neck, confusion, dizziness, change in vision, numbness or weakness of the face, arm, or leg, trouble speaking, trouble walking, vomiting Bowel blockage--stomach cramping, unable to have a bowel movement or pass gas, loss of appetite, vomiting Heart failure--shortness of breath, swelling of the ankles, feet, or hands, sudden weight gain, unusual weakness or fatigue Infection--fever, chills, cough, sore throat, wounds that don't heal, pain or trouble when passing urine, general feeling of discomfort or being unwell Liver injury--right upper belly pain, loss of appetite, nausea, light-colored stool, dark yellow or Ellard Nan urine, yellowing skin or eyes, unusual weakness or fatigue Low blood pressure--dizziness, feeling faint or lightheaded, blurry vision Lung injury--shortness of breath or trouble breathing,  cough, spitting up blood, chest pain, fever Pain, tingling, or numbness in the hands or feet Severe or prolonged diarrhea Stomach pain, bloody diarrhea, pale skin, unusual weakness or fatigue, decrease in the amount of urine, which may be signs of hemolytic uremic syndrome Sudden and severe headache, confusion, change in vision, seizures, which may be signs of posterior reversible encephalopathy syndrome (PRES) TTP--purple spots on the skin or inside the mouth, pale skin, yellowing skin or eyes, unusual weakness or fatigue, fever, fast or irregular heartbeat, confusion, change in vision, trouble speaking, trouble walking Tumor lysis syndrome (TLS)--nausea, vomiting, diarrhea, decrease in the amount of urine, dark urine, unusual weakness or fatigue, confusion,  muscle pain or cramps, fast or irregular heartbeat, joint pain Side effects that usually do not require medical attention (report to your care team if they continue or are bothersome): Constipation Diarrhea Fatigue Loss of appetite Nausea This list may not describe all possible side effects. Call your doctor for medical advice about side effects. You may report side effects to FDA at 1-800-FDA-1088. Where should I keep my medication? This medication is given in a hospital or clinic. It will not be stored at home. NOTE: This sheet is a summary. It may not cover all possible information. If you have questions about this medicine, talk to your doctor, pharmacist, or health care provider.  2024 Elsevier/Gold Standard (2022-01-09 00:00:00)        To help prevent nausea and vomiting after your treatment, we encourage you to take your nausea medication as directed.  BELOW ARE SYMPTOMS THAT SHOULD BE REPORTED IMMEDIATELY: *FEVER GREATER THAN 100.4 F (38 C) OR HIGHER *CHILLS OR SWEATING *NAUSEA AND VOMITING THAT IS NOT CONTROLLED WITH YOUR NAUSEA MEDICATION *UNUSUAL SHORTNESS OF BREATH *UNUSUAL BRUISING OR BLEEDING *URINARY PROBLEMS (pain or burning when urinating, or frequent urination) *BOWEL PROBLEMS (unusual diarrhea, constipation, pain near the anus) TENDERNESS IN MOUTH AND THROAT WITH OR WITHOUT PRESENCE OF ULCERS (sore throat, sores in mouth, or a toothache) UNUSUAL RASH, SWELLING OR PAIN  UNUSUAL VAGINAL DISCHARGE OR ITCHING   Items with * indicate a potential emergency and should be followed up as soon as possible or go to the Emergency Department if any problems should occur.  Please show the CHEMOTHERAPY ALERT CARD or IMMUNOTHERAPY ALERT CARD at check-in to the Emergency Department and triage nurse.  Should you have questions after your visit or need to cancel or reschedule your appointment, please contact [facility] [phone]  and follow the prompts.   Office hours are 8:00 a.m. to 4:30 p.m. Monday - Friday. Please note that voicemails left after 4:00 p.m. may not be returned until the following business day.  We are closed weekends and major holidays. You have access to a nurse at all times for urgent questions. Please call the main number to the clinic [phone] and follow the prompts.  For any non-urgent questions, you may also contact your provider using MyChart. We now offer e-Visits for anyone 84 and older to request care online for non-urgent symptoms. For details visit [email or web address].   Also download the MyChart app! Go to the app store, search "MyChart", open the app, select [place], and log in with your MyChart username and password.

## 2023-04-16 ENCOUNTER — Other Ambulatory Visit: Payer: Self-pay

## 2023-04-17 ENCOUNTER — Other Ambulatory Visit: Payer: Self-pay | Admitting: *Deleted

## 2023-04-17 DIAGNOSIS — C9000 Multiple myeloma not having achieved remission: Secondary | ICD-10-CM

## 2023-04-17 MED ORDER — HYDROCODONE-ACETAMINOPHEN 10-325 MG PO TABS
1.0000 | ORAL_TABLET | Freq: Four times a day (QID) | ORAL | 0 refills | Status: DC | PRN
Start: 2023-04-17 — End: 2023-05-15

## 2023-04-29 ENCOUNTER — Encounter: Payer: Self-pay | Admitting: Hematology

## 2023-04-29 ENCOUNTER — Inpatient Hospital Stay: Payer: 59

## 2023-04-29 ENCOUNTER — Inpatient Hospital Stay: Payer: 59 | Attending: Hematology

## 2023-04-29 DIAGNOSIS — C9000 Multiple myeloma not having achieved remission: Secondary | ICD-10-CM | POA: Insufficient documentation

## 2023-04-29 DIAGNOSIS — Z5112 Encounter for antineoplastic immunotherapy: Secondary | ICD-10-CM | POA: Insufficient documentation

## 2023-04-29 LAB — COMPREHENSIVE METABOLIC PANEL
ALT: 23 U/L (ref 0–44)
AST: 21 U/L (ref 15–41)
Albumin: 3.8 g/dL (ref 3.5–5.0)
Alkaline Phosphatase: 79 U/L (ref 38–126)
Anion gap: 6 (ref 5–15)
BUN: 20 mg/dL (ref 8–23)
CO2: 26 mmol/L (ref 22–32)
Calcium: 8.6 mg/dL — ABNORMAL LOW (ref 8.9–10.3)
Chloride: 106 mmol/L (ref 98–111)
Creatinine, Ser: 1.52 mg/dL — ABNORMAL HIGH (ref 0.44–1.00)
GFR, Estimated: 36 mL/min — ABNORMAL LOW (ref >60)
Glucose, Bld: 128 mg/dL — ABNORMAL HIGH (ref 70–99)
Potassium: 4.2 mmol/L (ref 3.5–5.1)
Sodium: 138 mmol/L (ref 135–145)
Total Bilirubin: 0.6 mg/dL (ref 0.3–1.2)
Total Protein: 6.3 g/dL — ABNORMAL LOW (ref 6.5–8.1)

## 2023-04-29 LAB — CBC WITH DIFFERENTIAL/PLATELET
Abs Immature Granulocytes: 0.01 10*3/uL (ref 0.00–0.07)
Basophils Absolute: 0 10*3/uL (ref 0.0–0.1)
Basophils Relative: 1 %
Eosinophils Absolute: 0.2 10*3/uL (ref 0.0–0.5)
Eosinophils Relative: 4 %
HCT: 29.7 % — ABNORMAL LOW (ref 36.0–46.0)
Hemoglobin: 9.6 g/dL — ABNORMAL LOW (ref 12.0–15.0)
Immature Granulocytes: 0 %
Lymphocytes Relative: 13 %
Lymphs Abs: 0.6 10*3/uL — ABNORMAL LOW (ref 0.7–4.0)
MCH: 33 pg (ref 26.0–34.0)
MCHC: 32.3 g/dL (ref 30.0–36.0)
MCV: 102.1 fL — ABNORMAL HIGH (ref 80.0–100.0)
Monocytes Absolute: 0.4 10*3/uL (ref 0.1–1.0)
Monocytes Relative: 10 %
Neutro Abs: 3.1 10*3/uL (ref 1.7–7.7)
Neutrophils Relative %: 72 %
Platelets: 117 10*3/uL — ABNORMAL LOW (ref 150–400)
RBC: 2.91 MIL/uL — ABNORMAL LOW (ref 3.87–5.11)
RDW: 12.2 % (ref 11.5–15.5)
WBC: 4.2 10*3/uL (ref 4.0–10.5)
nRBC: 0 % (ref 0.0–0.2)

## 2023-04-29 LAB — MAGNESIUM: Magnesium: 1.9 mg/dL (ref 1.7–2.4)

## 2023-04-29 MED ORDER — HEPARIN SOD (PORK) LOCK FLUSH 100 UNIT/ML IV SOLN
500.0000 [IU] | Freq: Once | INTRAVENOUS | Status: AC
  Administered 2023-04-29: 500 [IU] via INTRAVENOUS

## 2023-04-29 MED ORDER — FUROSEMIDE 20 MG PO TABS: 20.0000 mg | ORAL_TABLET | ORAL | 0 refills | Status: DC | PRN

## 2023-04-29 MED ORDER — SODIUM CHLORIDE 0.9% FLUSH
10.0000 mL | Freq: Once | INTRAVENOUS | Status: AC
  Administered 2023-04-29: 10 mL via INTRAVENOUS

## 2023-04-29 MED ORDER — MECLIZINE HCL 12.5 MG PO TABS: 12.5000 mg | ORAL_TABLET | Freq: Every day | ORAL | 0 refills | Status: DC

## 2023-04-29 MED ORDER — BORTEZOMIB CHEMO SQ INJECTION 3.5 MG (2.5MG/ML)
1.0000 mg/m2 | Freq: Once | INTRAMUSCULAR | Status: AC
  Administered 2023-04-29: 2 mg via SUBCUTANEOUS
  Filled 2023-04-29: qty 0.8

## 2023-04-29 NOTE — Patient Instructions (Signed)
[facility]  Discharge Instructions: Thank you for choosing [facility] to provide your oncology and hematology care.  If you have a lab appointment with the Cancer Center - please note that after April 8th, 2024, all labs will be drawn in the cancer center.  You do not have to check in or register with the main entrance as you have in the past but will complete your check-in in the cancer center.  Wear comfortable clothing and clothing appropriate for easy access to any Portacath or PICC line.   We strive to give you quality time with your provider. You may need to reschedule your appointment if you arrive late (15 or more minutes).  Arriving late affects you and other patients whose appointments are after yours.  Also, if you miss three or more appointments without notifying the office, you may be dismissed from the clinic at the provider's discretion.      For prescription refill requests, have your pharmacy contact our office and allow 72 hours for refills to be completed.    Today you received the following chemotherapy and/or immunotherapy agents Velcade injection   To help prevent nausea and vomiting after your treatment, we encourage you to take your nausea medication as directed.  BELOW ARE SYMPTOMS THAT SHOULD BE REPORTED IMMEDIATELY: *FEVER GREATER THAN 100.4 F (38 C) OR HIGHER *CHILLS OR SWEATING *NAUSEA AND VOMITING THAT IS NOT CONTROLLED WITH YOUR NAUSEA MEDICATION *UNUSUAL SHORTNESS OF BREATH *UNUSUAL BRUISING OR BLEEDING *URINARY PROBLEMS (pain or burning when urinating, or frequent urination) *BOWEL PROBLEMS (unusual diarrhea, constipation, pain near the anus) TENDERNESS IN MOUTH AND THROAT WITH OR WITHOUT PRESENCE OF ULCERS (sore throat, sores in mouth, or a toothache) UNUSUAL RASH, SWELLING OR PAIN  UNUSUAL VAGINAL DISCHARGE OR ITCHING   Items with * indicate a potential emergency and should be followed up as soon as possible or go to  the Emergency Department if any problems should occur.  Please show the CHEMOTHERAPY ALERT CARD or IMMUNOTHERAPY ALERT CARD at check-in to the Emergency Department and triage nurse.  Should you have questions after your visit or need to cancel or reschedule your appointment, please contact [facility] AT [facility] [phone]  and follow the prompts.  Office hours are 8:00 a.m. to 4:30 p.m. Monday - Friday. Please note that voicemails left after 4:00 p.m. may not be returned until the following business day.  We are closed weekends and major holidays. You have access to a nurse at all times for urgent questions. Please call the main number to the clinic [phone] and follow the prompts.  For any non-urgent questions, you may also contact your provider using MyChart. We now offer e-Visits for anyone 31 and older to request care online for non-urgent symptoms. For details visit [email or web address].   Also download the MyChart app! Go to the app store, search "MyChart", open the app, select [place], and log in with your MyChart username and password.

## 2023-04-29 NOTE — Progress Notes (Signed)
Patients port flushed without difficulty.  Good blood return noted with no bruising or swelling noted at site.  Band aid applied.  VSS with discharge and left in satisfactory condition with no s/s of distress noted.   

## 2023-04-29 NOTE — Progress Notes (Signed)
Labs reviewed, creatinine is 1.52, ok to treat per MD. Patient also took her pre-meds at home  Patient stated she gained 5 lbs in 2 weeks. Patient stated she is taking her steroid every time she comes for treatment. MD made aware and stated decadron is monthly with Darzalex injection. RN printed her schedule and wrote down when to take steroid and highlighted it.   Also called in some lasix per MD order for ankle swelling as needed.

## 2023-04-30 ENCOUNTER — Other Ambulatory Visit: Payer: Self-pay | Admitting: Hematology

## 2023-05-06 ENCOUNTER — Other Ambulatory Visit: Payer: Self-pay | Admitting: *Deleted

## 2023-05-07 ENCOUNTER — Encounter: Payer: Self-pay | Admitting: Hematology

## 2023-05-07 MED ORDER — ZOLPIDEM TARTRATE 10 MG PO TABS: 10.0000 mg | ORAL_TABLET | Freq: Every evening | ORAL | 5 refills | Status: DC | PRN

## 2023-05-11 ENCOUNTER — Other Ambulatory Visit: Payer: Self-pay

## 2023-05-13 ENCOUNTER — Inpatient Hospital Stay: Payer: 59

## 2023-05-13 VITALS — BP 161/73 | HR 76 | Temp 97.1°F | Resp 18 | Wt 190.2 lb

## 2023-05-13 DIAGNOSIS — C9000 Multiple myeloma not having achieved remission: Secondary | ICD-10-CM

## 2023-05-13 DIAGNOSIS — Z5112 Encounter for antineoplastic immunotherapy: Secondary | ICD-10-CM | POA: Diagnosis not present

## 2023-05-13 DIAGNOSIS — Z95828 Presence of other vascular implants and grafts: Secondary | ICD-10-CM

## 2023-05-13 LAB — CBC WITH DIFFERENTIAL/PLATELET
Abs Immature Granulocytes: 0.02 10*3/uL (ref 0.00–0.07)
Basophils Absolute: 0 10*3/uL (ref 0.0–0.1)
Basophils Relative: 1 %
Eosinophils Absolute: 0.2 10*3/uL (ref 0.0–0.5)
Eosinophils Relative: 4 %
HCT: 31.3 % — ABNORMAL LOW (ref 36.0–46.0)
Hemoglobin: 9.9 g/dL — ABNORMAL LOW (ref 12.0–15.0)
Immature Granulocytes: 1 %
Lymphocytes Relative: 15 %
Lymphs Abs: 0.7 10*3/uL (ref 0.7–4.0)
MCH: 32.7 pg (ref 26.0–34.0)
MCHC: 31.6 g/dL (ref 30.0–36.0)
MCV: 103.3 fL — ABNORMAL HIGH (ref 80.0–100.0)
Monocytes Absolute: 0.4 10*3/uL (ref 0.1–1.0)
Monocytes Relative: 10 %
Neutro Abs: 3.1 10*3/uL (ref 1.7–7.7)
Neutrophils Relative %: 69 %
Platelets: 139 10*3/uL — ABNORMAL LOW (ref 150–400)
RBC: 3.03 MIL/uL — ABNORMAL LOW (ref 3.87–5.11)
RDW: 12.3 % (ref 11.5–15.5)
WBC: 4.3 10*3/uL (ref 4.0–10.5)
nRBC: 0 % (ref 0.0–0.2)

## 2023-05-13 LAB — MAGNESIUM: Magnesium: 2.1 mg/dL (ref 1.7–2.4)

## 2023-05-13 LAB — COMPREHENSIVE METABOLIC PANEL
ALT: 25 U/L (ref 0–44)
AST: 19 U/L (ref 15–41)
Albumin: 3.8 g/dL (ref 3.5–5.0)
Alkaline Phosphatase: 92 U/L (ref 38–126)
Anion gap: 7 (ref 5–15)
BUN: 22 mg/dL (ref 8–23)
CO2: 29 mmol/L (ref 22–32)
Calcium: 8.8 mg/dL — ABNORMAL LOW (ref 8.9–10.3)
Chloride: 100 mmol/L (ref 98–111)
Creatinine, Ser: 1.56 mg/dL — ABNORMAL HIGH (ref 0.44–1.00)
GFR, Estimated: 35 mL/min — ABNORMAL LOW (ref >60)
Glucose, Bld: 105 mg/dL — ABNORMAL HIGH (ref 70–99)
Potassium: 4.7 mmol/L (ref 3.5–5.1)
Sodium: 136 mmol/L (ref 135–145)
Total Bilirubin: 0.4 mg/dL (ref 0.3–1.2)
Total Protein: 6.5 g/dL (ref 6.5–8.1)

## 2023-05-13 MED ORDER — DEXAMETHASONE 4 MG PO TABS
20.0000 mg | ORAL_TABLET | Freq: Once | ORAL | Status: DC
  Filled 2023-05-13: qty 5

## 2023-05-13 MED ORDER — BORTEZOMIB CHEMO SQ INJECTION 3.5 MG (2.5MG/ML)
1.0000 mg/m2 | Freq: Once | INTRAMUSCULAR | Status: AC
  Administered 2023-05-13: 2 mg via SUBCUTANEOUS
  Filled 2023-05-13: qty 0.8

## 2023-05-13 MED ORDER — SODIUM CHLORIDE 0.9% FLUSH
10.0000 mL | INTRAVENOUS | Status: DC | PRN
  Administered 2023-05-13: 10 mL via INTRAVENOUS

## 2023-05-13 MED ORDER — ACETAMINOPHEN 325 MG PO TABS
650.0000 mg | ORAL_TABLET | Freq: Once | ORAL | Status: DC
  Filled 2023-05-13: qty 2

## 2023-05-13 MED ORDER — DARATUMUMAB-HYALURONIDASE-FIHJ 1800-30000 MG-UT/15ML ~~LOC~~ SOLN
1800.0000 mg | Freq: Once | SUBCUTANEOUS | Status: AC
  Administered 2023-05-13: 1800 mg via SUBCUTANEOUS
  Filled 2023-05-13: qty 15

## 2023-05-13 MED ORDER — HEPARIN SOD (PORK) LOCK FLUSH 100 UNIT/ML IV SOLN
500.0000 [IU] | Freq: Once | INTRAVENOUS | Status: AC
  Administered 2023-05-13: 500 [IU] via INTRAVENOUS

## 2023-05-13 MED ORDER — DIPHENHYDRAMINE HCL 25 MG PO CAPS
50.0000 mg | ORAL_CAPSULE | Freq: Once | ORAL | Status: DC
  Filled 2023-05-13: qty 2

## 2023-05-13 NOTE — Patient Instructions (Signed)
[facility]  Discharge Instructions: Thank you for choosing [facility] to provide your oncology and hematology care.  If you have a lab appointment with the Cancer Center - please note that after April 8th, 2024, all labs will be drawn in the cancer center.  You do not have to check in or register with the main entrance as you have in the past but will complete your check-in in the cancer center.  Wear comfortable clothing and clothing appropriate for easy access to any Portacath or PICC line.   We strive to give you quality time with your provider. You may need to reschedule your appointment if you arrive late (15 or more minutes).  Arriving late affects you and other patients whose appointments are after yours.  Also, if you miss three or more appointments without notifying the office, you may be dismissed from the clinic at the provider's discretion.      For prescription refill requests, have your pharmacy contact our office and allow 72 hours for refills to be completed.    Today you received the following chemotherapy and/or immunotherapy agents darzalex and velcade       To help prevent nausea and vomiting after your treatment, we encourage you to take your nausea medication as directed.  BELOW ARE SYMPTOMS THAT SHOULD BE REPORTED IMMEDIATELY: *FEVER GREATER THAN 100.4 F (38 C) OR HIGHER *CHILLS OR SWEATING *NAUSEA AND VOMITING THAT IS NOT CONTROLLED WITH YOUR NAUSEA MEDICATION *UNUSUAL SHORTNESS OF BREATH *UNUSUAL BRUISING OR BLEEDING *URINARY PROBLEMS (pain or burning when urinating, or frequent urination) *BOWEL PROBLEMS (unusual diarrhea, constipation, pain near the anus) TENDERNESS IN MOUTH AND THROAT WITH OR WITHOUT PRESENCE OF ULCERS (sore throat, sores in mouth, or a toothache) UNUSUAL RASH, SWELLING OR PAIN  UNUSUAL VAGINAL DISCHARGE OR ITCHING   Items with * indicate a potential emergency and should be followed up as soon as possible  or go to the Emergency Department if any problems should occur.  Please show the CHEMOTHERAPY ALERT CARD or IMMUNOTHERAPY ALERT CARD at check-in to the Emergency Department and triage nurse.  Should you have questions after your visit or need to cancel or reschedule your appointment, please contact [facility] AT [facility] [phone]  and follow the prompts.  Office hours are 8:00 a.m. to 4:30 p.m. Monday - Friday. Please note that voicemails left after 4:00 p.m. may not be returned until the following business day.  We are closed weekends and major holidays. You have access to a nurse at all times for urgent questions. Please call the main number to the clinic [phone] and follow the prompts.  For any non-urgent questions, you may also contact your provider using MyChart. We now offer e-Visits for anyone 23 and older to request care online for non-urgent symptoms. For details visit [email or web address].   Also download the MyChart app! Go to the app store, search "MyChart", open the app, select [place], and log in with your MyChart username and password.

## 2023-05-13 NOTE — Progress Notes (Signed)
Patients port flushed without difficulty.  Good blood return noted with no bruising or swelling noted at site.  Band aid applied.  VSS with discharge and left in satisfactory condition with no s/s of distress noted.

## 2023-05-13 NOTE — Progress Notes (Signed)
Patient presents today for Velcade injection and Darzalex Faspro. Creatinine 1.56. Message received from Dr. Anders Simmonds to proceed with treatment. Patient took premedications prior to arrival at 12:30 pm. Tylenol 650 mg, Benadryl 50 mg and 20 mgs Decadron.

## 2023-05-13 NOTE — Progress Notes (Signed)
Patient tolerated Daratumumab and Velcade injection with no complaints voiced.  See MAR for details.  Labs reviewed. Injection site clean and dry with no bruising or swelling noted at site.  Band aid applied.  Vss with discharge and left in satisfactory condition with no s/s of distress noted.

## 2023-05-14 ENCOUNTER — Other Ambulatory Visit: Payer: Self-pay

## 2023-05-15 ENCOUNTER — Other Ambulatory Visit: Payer: Self-pay | Admitting: *Deleted

## 2023-05-15 DIAGNOSIS — C9000 Multiple myeloma not having achieved remission: Secondary | ICD-10-CM

## 2023-05-15 MED ORDER — HYDROCODONE-ACETAMINOPHEN 10-325 MG PO TABS
1.0000 | ORAL_TABLET | Freq: Four times a day (QID) | ORAL | 0 refills | Status: DC | PRN
Start: 2023-05-15 — End: 2023-06-12

## 2023-05-23 ENCOUNTER — Other Ambulatory Visit: Payer: Self-pay | Admitting: *Deleted

## 2023-05-23 MED ORDER — MIDODRINE HCL 2.5 MG PO TABS: 2.5000 mg | ORAL_TABLET | Freq: Three times a day (TID) | ORAL | 1 refills | Status: DC

## 2023-05-24 ENCOUNTER — Other Ambulatory Visit: Payer: Self-pay

## 2023-05-24 MED ORDER — ALPRAZOLAM 0.25 MG PO TABS: ORAL_TABLET | ORAL | 2 refills | Status: DC

## 2023-05-25 ENCOUNTER — Other Ambulatory Visit: Payer: Self-pay

## 2023-05-27 ENCOUNTER — Inpatient Hospital Stay: Payer: 59

## 2023-05-27 ENCOUNTER — Inpatient Hospital Stay: Payer: 59 | Attending: Hematology

## 2023-05-27 ENCOUNTER — Inpatient Hospital Stay: Payer: 59 | Admitting: Hematology

## 2023-05-27 VITALS — Wt 193.2 lb

## 2023-05-27 VITALS — BP 129/76 | HR 67 | Temp 97.1°F | Resp 17

## 2023-05-27 DIAGNOSIS — F419 Anxiety disorder, unspecified: Secondary | ICD-10-CM | POA: Diagnosis not present

## 2023-05-27 DIAGNOSIS — Z7901 Long term (current) use of anticoagulants: Secondary | ICD-10-CM | POA: Insufficient documentation

## 2023-05-27 DIAGNOSIS — G629 Polyneuropathy, unspecified: Secondary | ICD-10-CM | POA: Diagnosis not present

## 2023-05-27 DIAGNOSIS — Z86711 Personal history of pulmonary embolism: Secondary | ICD-10-CM | POA: Insufficient documentation

## 2023-05-27 DIAGNOSIS — D509 Iron deficiency anemia, unspecified: Secondary | ICD-10-CM | POA: Insufficient documentation

## 2023-05-27 DIAGNOSIS — Z95828 Presence of other vascular implants and grafts: Secondary | ICD-10-CM

## 2023-05-27 DIAGNOSIS — C9000 Multiple myeloma not having achieved remission: Secondary | ICD-10-CM

## 2023-05-27 DIAGNOSIS — Z5112 Encounter for antineoplastic immunotherapy: Secondary | ICD-10-CM | POA: Insufficient documentation

## 2023-05-27 LAB — CBC WITH DIFFERENTIAL/PLATELET
Abs Immature Granulocytes: 0.01 10*3/uL (ref 0.00–0.07)
Basophils Absolute: 0 10*3/uL (ref 0.0–0.1)
Basophils Relative: 1 %
Eosinophils Absolute: 0.2 10*3/uL (ref 0.0–0.5)
Eosinophils Relative: 6 %
HCT: 30.4 % — ABNORMAL LOW (ref 36.0–46.0)
Hemoglobin: 9.7 g/dL — ABNORMAL LOW (ref 12.0–15.0)
Immature Granulocytes: 0 %
Lymphocytes Relative: 16 %
Lymphs Abs: 0.7 10*3/uL (ref 0.7–4.0)
MCH: 32.7 pg (ref 26.0–34.0)
MCHC: 31.9 g/dL (ref 30.0–36.0)
MCV: 102.4 fL — ABNORMAL HIGH (ref 80.0–100.0)
Monocytes Absolute: 0.5 10*3/uL (ref 0.1–1.0)
Monocytes Relative: 11 %
Neutro Abs: 2.9 10*3/uL (ref 1.7–7.7)
Neutrophils Relative %: 66 %
Platelets: 139 10*3/uL — ABNORMAL LOW (ref 150–400)
RBC: 2.97 MIL/uL — ABNORMAL LOW (ref 3.87–5.11)
RDW: 12.8 % (ref 11.5–15.5)
WBC: 4.3 10*3/uL (ref 4.0–10.5)
nRBC: 0 % (ref 0.0–0.2)

## 2023-05-27 LAB — COMPREHENSIVE METABOLIC PANEL
ALT: 24 U/L (ref 0–44)
AST: 23 U/L (ref 15–41)
Albumin: 3.9 g/dL (ref 3.5–5.0)
Alkaline Phosphatase: 80 U/L (ref 38–126)
Anion gap: 9 (ref 5–15)
BUN: 22 mg/dL (ref 8–23)
CO2: 26 mmol/L (ref 22–32)
Calcium: 8.6 mg/dL — ABNORMAL LOW (ref 8.9–10.3)
Chloride: 102 mmol/L (ref 98–111)
Creatinine, Ser: 1.45 mg/dL — ABNORMAL HIGH (ref 0.44–1.00)
GFR, Estimated: 38 mL/min — ABNORMAL LOW (ref >60)
Glucose, Bld: 72 mg/dL (ref 70–99)
Potassium: 4.1 mmol/L (ref 3.5–5.1)
Sodium: 137 mmol/L (ref 135–145)
Total Bilirubin: 0.5 mg/dL (ref 0.3–1.2)
Total Protein: 6.4 g/dL — ABNORMAL LOW (ref 6.5–8.1)

## 2023-05-27 LAB — MAGNESIUM: Magnesium: 2.3 mg/dL (ref 1.7–2.4)

## 2023-05-27 MED ORDER — SODIUM CHLORIDE 0.9% FLUSH
10.0000 mL | INTRAVENOUS | Status: DC | PRN
  Administered 2023-05-27: 10 mL via INTRAVENOUS

## 2023-05-27 MED ORDER — BORTEZOMIB CHEMO SQ INJECTION 3.5 MG (2.5MG/ML)
1.0000 mg/m2 | Freq: Once | INTRAMUSCULAR | Status: AC
  Administered 2023-05-27: 2 mg via SUBCUTANEOUS
  Filled 2023-05-27: qty 0.8

## 2023-05-27 MED ORDER — HEPARIN SOD (PORK) LOCK FLUSH 100 UNIT/ML IV SOLN
500.0000 [IU] | Freq: Once | INTRAVENOUS | Status: AC
  Administered 2023-05-27: 500 [IU] via INTRAVENOUS

## 2023-05-27 NOTE — Progress Notes (Signed)
Patients port flushed without difficulty.  Good blood return noted with no bruising or swelling noted at site.  Band aid applied.  VSS with discharge and left in satisfactory condition with no s/s of distress noted.   

## 2023-05-27 NOTE — Progress Notes (Signed)
Patient presents today for Velcade infusion per providers order.  Vital signs and labs within parameters for injection.  Patient has no new complaints at this time.  Patient takes steroids at home.  Stable during administration without incident; injection site WNL; see MAR for injection details.  Patient tolerated procedure well and without incident.  No questions or complaints noted at this time.

## 2023-05-27 NOTE — Patient Instructions (Signed)
[facility]  Discharge Instructions: Thank you for choosing [facility] to provide your oncology and hematology care.  If you have a lab appointment with the Cancer Center - please note that after April 8th, 2024, all labs will be drawn in the cancer center.  You do not have to check in or register with the main entrance as you have in the past but will complete your check-in in the cancer center.  Wear comfortable clothing and clothing appropriate for easy access to any Portacath or PICC line.   We strive to give you quality time with your provider. You may need to reschedule your appointment if you arrive late (15 or more minutes).  Arriving late affects you and other patients whose appointments are after yours.  Also, if you miss three or more appointments without notifying the office, you may be dismissed from the clinic at the provider's discretion.      For prescription refill requests, have your pharmacy contact our office and allow 72 hours for refills to be completed.    Today you received the following chemotherapy and/or immunotherapy agents velcade    To help prevent nausea and vomiting after your treatment, we encourage you to take your nausea medication as directed.  BELOW ARE SYMPTOMS THAT SHOULD BE REPORTED IMMEDIATELY: *FEVER GREATER THAN 100.4 F (38 C) OR HIGHER *CHILLS OR SWEATING *NAUSEA AND VOMITING THAT IS NOT CONTROLLED WITH YOUR NAUSEA MEDICATION *UNUSUAL SHORTNESS OF BREATH *UNUSUAL BRUISING OR BLEEDING *URINARY PROBLEMS (pain or burning when urinating, or frequent urination) *BOWEL PROBLEMS (unusual diarrhea, constipation, pain near the anus) TENDERNESS IN MOUTH AND THROAT WITH OR WITHOUT PRESENCE OF ULCERS (sore throat, sores in mouth, or a toothache) UNUSUAL RASH, SWELLING OR PAIN  UNUSUAL VAGINAL DISCHARGE OR ITCHING   Items with * indicate a potential emergency and should be followed up as soon as possible or go to the  Emergency Department if any problems should occur.  Please show the CHEMOTHERAPY ALERT CARD or IMMUNOTHERAPY ALERT CARD at check-in to the Emergency Department and triage nurse.  Should you have questions after your visit or need to cancel or reschedule your appointment, please contact [facility] [phone]  and follow the prompts.  Office hours are 8:00 a.m. to 4:30 p.m. Monday - Friday. Please note that voicemails left after 4:00 p.m. may not be returned until the following business day.  We are closed weekends and major holidays. You have access to a nurse at all times for urgent questions. Please call the main number to the clinic [phone] and follow the prompts.  For any non-urgent questions, you may also contact your provider using MyChart. We now offer e-Visits for anyone 18 and older to request care online for non-urgent symptoms. For details visit [email or web address].   Also download the MyChart app! Go to the app store, search "MyChart", open the app, select [facility], and log in with your MyChart username and password.   

## 2023-05-28 LAB — KAPPA/LAMBDA LIGHT CHAINS
Kappa free light chain: 5 mg/L (ref 3.3–19.4)
Kappa, lambda light chain ratio: 0.33 (ref 0.26–1.65)
Lambda free light chains: 15.3 mg/L (ref 5.7–26.3)

## 2023-05-30 LAB — PROTEIN ELECTROPHORESIS, SERUM
A/G Ratio: 1.6 (ref 0.7–1.7)
Albumin ELP: 3.6 g/dL (ref 2.9–4.4)
Alpha-1-Globulin: 0.2 g/dL (ref 0.0–0.4)
Alpha-2-Globulin: 1 g/dL (ref 0.4–1.0)
Beta Globulin: 0.9 g/dL (ref 0.7–1.3)
Gamma Globulin: 0.2 g/dL — ABNORMAL LOW (ref 0.4–1.8)
Globulin, Total: 2.3 g/dL (ref 2.2–3.9)
M-Spike, %: 0.1 g/dL — ABNORMAL HIGH
Total Protein ELP: 5.9 g/dL — ABNORMAL LOW (ref 6.0–8.5)

## 2023-06-08 ENCOUNTER — Other Ambulatory Visit: Payer: Self-pay

## 2023-06-10 ENCOUNTER — Inpatient Hospital Stay: Payer: 59

## 2023-06-10 ENCOUNTER — Inpatient Hospital Stay (HOSPITAL_BASED_OUTPATIENT_CLINIC_OR_DEPARTMENT_OTHER): Payer: 59 | Admitting: Hematology

## 2023-06-10 DIAGNOSIS — C9000 Multiple myeloma not having achieved remission: Secondary | ICD-10-CM

## 2023-06-10 DIAGNOSIS — R531 Weakness: Secondary | ICD-10-CM

## 2023-06-10 DIAGNOSIS — Z5112 Encounter for antineoplastic immunotherapy: Secondary | ICD-10-CM | POA: Diagnosis not present

## 2023-06-10 LAB — CBC WITH DIFFERENTIAL/PLATELET
Abs Immature Granulocytes: 0.01 10*3/uL (ref 0.00–0.07)
Basophils Absolute: 0 10*3/uL (ref 0.0–0.1)
Basophils Relative: 1 %
Eosinophils Absolute: 0.2 10*3/uL (ref 0.0–0.5)
Eosinophils Relative: 5 %
HCT: 31.6 % — ABNORMAL LOW (ref 36.0–46.0)
Hemoglobin: 9.9 g/dL — ABNORMAL LOW (ref 12.0–15.0)
Immature Granulocytes: 0 %
Lymphocytes Relative: 15 %
Lymphs Abs: 0.6 10*3/uL — ABNORMAL LOW (ref 0.7–4.0)
MCH: 31.8 pg (ref 26.0–34.0)
MCHC: 31.3 g/dL (ref 30.0–36.0)
MCV: 101.6 fL — ABNORMAL HIGH (ref 80.0–100.0)
Monocytes Absolute: 0.4 10*3/uL (ref 0.1–1.0)
Monocytes Relative: 12 %
Neutro Abs: 2.4 10*3/uL (ref 1.7–7.7)
Neutrophils Relative %: 67 %
Platelets: 122 10*3/uL — ABNORMAL LOW (ref 150–400)
RBC: 3.11 MIL/uL — ABNORMAL LOW (ref 3.87–5.11)
RDW: 12.7 % (ref 11.5–15.5)
WBC: 3.6 10*3/uL — ABNORMAL LOW (ref 4.0–10.5)
nRBC: 0 % (ref 0.0–0.2)

## 2023-06-10 LAB — MAGNESIUM: Magnesium: 2.3 mg/dL (ref 1.7–2.4)

## 2023-06-10 LAB — COMPREHENSIVE METABOLIC PANEL
ALT: 28 U/L (ref 0–44)
AST: 23 U/L (ref 15–41)
Albumin: 4 g/dL (ref 3.5–5.0)
Alkaline Phosphatase: 81 U/L (ref 38–126)
Anion gap: 4 — ABNORMAL LOW (ref 5–15)
BUN: 27 mg/dL — ABNORMAL HIGH (ref 8–23)
CO2: 28 mmol/L (ref 22–32)
Calcium: 8.7 mg/dL — ABNORMAL LOW (ref 8.9–10.3)
Chloride: 103 mmol/L (ref 98–111)
Creatinine, Ser: 1.49 mg/dL — ABNORMAL HIGH (ref 0.44–1.00)
GFR, Estimated: 36 mL/min — ABNORMAL LOW (ref >60)
Glucose, Bld: 79 mg/dL (ref 70–99)
Potassium: 4.3 mmol/L (ref 3.5–5.1)
Sodium: 135 mmol/L (ref 135–145)
Total Bilirubin: 0.3 mg/dL (ref 0.3–1.2)
Total Protein: 6.5 g/dL (ref 6.5–8.1)

## 2023-06-10 MED ORDER — HEPARIN SOD (PORK) LOCK FLUSH 100 UNIT/ML IV SOLN
500.0000 [IU] | Freq: Once | INTRAVENOUS | Status: AC
  Administered 2023-06-10: 500 [IU] via INTRAVENOUS

## 2023-06-10 MED ORDER — SODIUM CHLORIDE 0.9% FLUSH
10.0000 mL | INTRAVENOUS | Status: AC
  Administered 2023-06-10: 10 mL

## 2023-06-10 NOTE — Progress Notes (Signed)
No treatment today per Dr. Delton Coombes.

## 2023-06-10 NOTE — Progress Notes (Signed)
[facility] [address], [place] [address]    Clinic Day:  06/10/2023  Referring physician: Doreatha Massed, MD  Patient Care Team: Alvina Filbert, MD as PCP - General (Internal Medicine) Doreatha Massed, MD as Medical Oncologist (Medical Oncology) Lanelle Bal, DO as Consulting Physician (Gastroenterology)   ASSESSMENT & PLAN:   Assessment: 1.  IgG lambda plasma cell myeloma, stage II, standard risk: -4 cycles of KPD from 04/17/2018 through 08/21/2018, stem cell transplant on 10/23/2018. -PET scan on 01/21/2019 showed multiple bone lesions but without any hypermetabolic activity. -BMBX on 01/21/2019 with normocellular marrow with no increase in plasma cells.  Normal FISH.  MRD results negative. -Maintenance pomalidomide 2 mg 3 weeks on/1 week off started on 03/10/2019. -Bone marrow biopsy on 10/29/2019 shows trilineage hematopoiesis with no evidence of plasma cells.  Chromosome analysis and FISH are normal. -BM BX on 10/27/2020 at [facility]-normocellular marrow with 30 to 40%, TLH.  2% of the total cells are CD 138+ plasma cells. - Pomalyst was held due to dizziness from 03/29/2021 through 04/26/2021. - MRI of the brain on 03/31/2021 did not show any evidence of intracranial lesions.  Multiple bone lesions in the skull compatible with sequela of myeloma.  - Labs at [facility] on 11/16/2021: M spike 0.24 g.  Immunofixation positive for IgG lambda. - PET scan on 12/14/2021: No evidence of FDG avid osseous or soft tissue myeloma. - She has developed left shoulder blade and right posterior rib pain for the last 3 to 4 months. - 24-hour urine on 12/14/2021: Total protein 136 mg.  Immune immunofixation positive for lambda type Bence-Jones protein. -- PET scan (05/24/2022): Single new hypermetabolic lytic lesion involving left lamina of T11.  No canal encroachment.  No other hypermetabolic bone lesions.  Stable diffuse lytic lesions throughout the axial and appendicular  skeleton.  No soft tissue lesions. - Maintenance Pomalyst discontinued on 05/29/2022 - XRT to the T11 completed on 06/20/2022 - Daratumumab, Velcade and dexamethasone started on 06/11/2022   2.  Pulmonary embolism: -CT angiogram on 04/21/2020 showed filling defect at [facility]. -VQ scan confirmed pulmonary embolism. -She is on Eliquis.   3.  Right shoulder and upper arm pain: - She received XRT to the chest from 12/26/2020 through 01/06/2021.   4.  Myeloma bone disease: - Denosumab held due to exposure of bone in the right lower jaw.  Last dose in 2020.    Plan: 1.  IgG lambda plasma cell myeloma: - She is receiving Velcade every 2 weeks, Darzalex every 4 weeks and takes dexamethasone 20 mg on days of Darzalex. - Reviewed myeloma labs from 05/27/2023: M spike 0.1 g and stable.  Free light chain ratio is normal at 0.33. - She reported having stuttering, worse in the last 1 week.  She also reported weakness in the right hand and right leg.  Denies any falls. - Physical exam: Muscle strength and both upper extremities and lower extremities is 4/5. - Will hold her treatment today. - Will obtain brain MRI with and without contrast. - Will make neurology referral for stuttering/weakness. - RTC after MRI.   2.  Back pain/left shoulder blade pain/right lateral and posterior rib pains: - Continue hydrocodone 10/325 every 6-8 hours as needed.   3.  Hypomagnesemia: - Continue mag twice daily.  Magnesium is normal.   4.  Anxiety: - Continue Xanax 0.5 mg twice daily.   5.  Sleeping difficulty: - Continue Ambien daily.   6.  Peripheral neuropathy: - She has neuropathy in the fingertips and feet with numbness.  Continue gabapentin 300 mg 3 times daily.   7.  Pulmonary embolism: - Continue Eliquis twice daily.  No bleeding issues.    Orders Placed This Encounter  Procedures   MR Brain W Wo Contrast    Standing Status:   Future    Standing Expiration Date:   06/09/2024     Order Specific Question:   If indicated for the ordered procedure, I authorize the administration of contrast media per Radiology protocol    Answer:   Yes    Order Specific Question:   What is the patient's sedation requirement?    Answer:   No Sedation    Order Specific Question:   Does the patient have a pacemaker or implanted devices?    Answer:   No    Order Specific Question:   Use SRS Protocol?    Answer:   No    Order Specific Question:   Preferred imaging location?    Answer:   [facility] (table limit - 550lbs)      Alben Deeds Teague,acting as a scribe for Doreatha Massed, MD.,have documented all relevant documentation on the behalf of Doreatha Massed, MD,as directed by  Doreatha Massed, MD while in the presence of Doreatha Massed, MD.  I, Doreatha Massed MD, have reviewed the above documentation for accuracy and completeness, and I agree with the above.    Doreatha Massed, MD   10/21/20244:15 PM  CHIEF COMPLAINT:   Diagnosis: multiple myeloma    Cancer Staging  No matching staging information was found for the patient.    Prior Therapy: 1. KPD x 4 cycles from 04/17/2018 to 08/21/2018. 2. Stem cell transplant on 10/23/2018. 3. Radiation to left and right humerus 25 Gy in 10 fractions from 08/23/2020 to 08/25/2020. 4.  Maintenance Pomalyst until 06/08/2022  Current Therapy:  Darzalex, Velcade and dexamethasone started on 06/11/2022    HISTORY OF PRESENT ILLNESS:   Oncology History  Multiple myeloma not having achieved remission (HCC)  04/07/2018 Initial Diagnosis   Multiple myeloma not having achieved remission (HCC)   06/11/2022 -  Chemotherapy   Patient is on Treatment Plan : MYELOMA RELAPSED / REFRACTORY Daratumumab SQ + Bortezomib + Dexamethasone (DaraVd) q21d / Daratumumab SQ q28d      Multiple myeloma without remission (HCC)  04/15/2018 Initial Diagnosis   Multiple myeloma without remission (HCC)   04/17/2018 - 09/05/2018  Chemotherapy   The patient had dexamethasone (DECADRON) 4 MG tablet, 1 of 1 cycle, Start date: 04/15/2018, End date: 05/26/2018 palonosetron (ALOXI) injection 0.25 mg, 0.25 mg, Intravenous,  Once, 1 of 1 cycle Administration: 0.25 mg (04/17/2018), 0.25 mg (04/24/2018), 0.25 mg (05/01/2018) cyclophosphamide (CYTOXAN) 540 mg in sodium chloride 0.9 % 250 mL chemo infusion, 300 mg/m2 = 540 mg, Intravenous,  Once, 1 of 1 cycle Administration: 540 mg (04/17/2018), 540 mg (04/24/2018), 540 mg (05/01/2018) carfilzomib (KYPROLIS) 36 mg in dextrose 5 % 50 mL chemo infusion, 20 mg/m2 = 36 mg, Intravenous, Once, 5 of 5 cycles Administration: 36 mg (04/17/2018), 36 mg (04/18/2018), 60 mg (04/24/2018), 60 mg (04/25/2018), 60 mg (05/01/2018), 60 mg (05/02/2018), 60 mg (05/15/2018), 60 mg (05/16/2018), 60 mg (06/12/2018), 60 mg (06/13/2018), 60 mg (06/26/2018), 60 mg (06/27/2018), 60 mg (07/03/2018), 60 mg (07/04/2018), 60 mg (07/24/2018), 60 mg (07/25/2018), 60 mg (07/31/2018), 60 mg (08/01/2018), 60 mg (08/08/2018), 60 mg (08/07/2018), 60 mg (08/21/2018), 60 mg (08/22/2018), 60 mg (08/28/2018), 60 mg (08/29/2018),  60 mg (09/04/2018), 60 mg (09/05/2018)  for chemotherapy treatment.    06/11/2022 -  Chemotherapy   Patient is on Treatment Plan : MYELOMA RELAPSED / REFRACTORY Daratumumab SQ + Bortezomib + Dexamethasone (DaraVd) q21d / Daratumumab SQ q28d         INTERVAL HISTORY:   Alexandra Price is a 77 y.o. female presenting to clinic today for follow up of multiple myeloma. She was last seen by me on 04/15/23.  Today, she states that she is doing well overall. Her appetite level is at 100%. Her energy level is at 40%.  PAST MEDICAL HISTORY:   Past Medical History: Past Medical History:  Diagnosis Date   Anxiety    Chronic kidney disease    Depression    Hypertension    Multiple myeloma (HCC)    multiple myeloma   Pre-diabetes     Surgical History: Past Surgical History:  Procedure Laterality Date   ABDOMINAL HYSTERECTOMY     total    APPENDECTOMY     BALLOON DILATION N/A 05/28/2022   Procedure: BALLOON DILATION;  Surgeon: Lanelle Bal, DO;  [facility];  Service: Endoscopy;  Laterality: N/A;   BIOPSY  05/28/2022   Procedure: BIOPSY;  Surgeon: Lanelle Bal, DO;  [facility];  Service: Endoscopy;;   COLONOSCOPY WITH PROPOFOL N/A 12/25/2021   Procedure: COLONOSCOPY WITH PROPOFOL;  Surgeon: Lanelle Bal, DO;  [facility];  Service: Endoscopy;  Laterality: N/A;  2:30pm   ESOPHAGOGASTRODUODENOSCOPY (EGD) WITH PROPOFOL N/A 05/28/2022   Procedure: ESOPHAGOGASTRODUODENOSCOPY (EGD) WITH PROPOFOL;  Surgeon: Lanelle Bal, DO;  [facility];  Service: Endoscopy;  Laterality: N/A;  10:00am, asa 3   LAPAROSCOPIC APPENDECTOMY N/A 05/20/2018   Procedure: APPENDECTOMY LAPAROSCOPIC;  Surgeon: Franky Macho, MD;  Location: [facility];  Service: General;  Laterality: N/A;   POLYPECTOMY  12/25/2021   Procedure: POLYPECTOMY;  Surgeon: Lanelle Bal, DO;  [facility];  Service: Endoscopy;;   PORTACATH PLACEMENT Right 04/14/2018   Procedure: INSERTION PORT-A-CATH;  Surgeon: Franky Macho, MD;  Location: [facility];  Service: General;  Laterality: Right;    Social History: Social History   Socioeconomic History   Marital status: Divorced    Spouse name: Not on file   Number of children: 6   Years of education: Not on file   Highest education level: Not on file  Occupational History    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  Tobacco Use   Smoking status: Never   Smokeless tobacco: Never  Vaping Use   Vaping status: Never Used  Substance and Sexual Activity   Alcohol use: Never   Drug use: Not Currently   Sexual activity: Not Currently  Other Topics Concern   Not on file  Social History Narrative   Not on file   Social Determinants of Health   Financial Resource Strain: Low Risk  (07/06/2020)   Overall Financial Resource Strain (CARDIA)    Difficulty of Paying Living  Expenses: Not hard at all  Food Insecurity: No Food Insecurity (09/17/2022)   Hunger Vital Sign    Worried About Running Out of Food in the Last Year: Never true    Ran Out of Food in the Last Year: Never true  Transportation Needs: No Transportation Needs (09/17/2022)   PRAPARE - Administrator, Civil Service (Medical): No    Lack of Transportation (Non-Medical): No  Physical Activity: Inactive (07/06/2020)   Exercise Vital Sign  Days of Exercise per Week: 0 days    Minutes of Exercise per Session: 0 min  Stress: No Stress Concern Present (07/06/2020)   [employer] of Occupational Health - Occupational Stress Questionnaire    Feeling of Stress : Not at all  Social Connections: Moderately Isolated (07/06/2020)   Social Connection and Isolation Panel [NHANES]    Frequency of Communication with Friends and Family: More than three times a week    Frequency of Social Gatherings with Friends and Family: Twice a week    Attends Religious Services: 1 to 4 times per year    Active Member of [employer] or Organizations: No    Attends Banker Meetings: Never    Marital Status: Separated  Intimate Partner Violence: Not At Risk (09/17/2022)   Humiliation, Afraid, Rape, and Kick questionnaire    Fear of Current or Ex-Partner: No    Emotionally Abused: No    Physically Abused: No    Sexually Abused: No    Family History: Family History  Problem Relation Age of Onset   Heart disease Mother    Emphysema Father    Diabetes Sister    Depression Sister    Cancer Brother        liver, lung, and colon. colon cancer at age 33.   Diabetes Brother     Current Medications:  Current Outpatient Medications:    acetaminophen (TYLENOL) 325 MG tablet, Take 650 mg by mouth as needed for moderate pain (prior to infusion)., Disp: , Rfl:    acyclovir (ZOVIRAX) 400 MG tablet, Take 1 tablet (400 mg total) by mouth 2 (two) times daily., Disp: 60 tablet, Rfl: 6   ALPRAZolam (XANAX)  0.25 MG tablet, Take one tablet (0.25 mg) by mouth in the morning as needed, and two tablets (0.5 mg) at bedtime as needed, Disp: 90 tablet, Rfl: 2   Calcium Carb-Cholecalciferol (CALCIUM 1000 + D PO), Take 1,000 mg by mouth daily., Disp: , Rfl:    chlorthalidone (HYGROTON) 25 MG tablet, Take 25 mg by mouth every morning., Disp: , Rfl:    dexAMETHasone 20 MG TABS, Take 1 tablet by mouth as directed. Take 1 tablet 30 minutes prior to infusions (Patient taking differently: Take 1 tablet by mouth as directed. Take 1 tablet 30 minutes prior to the Darzelex injections-once a month), Disp: 30 tablet, Rfl: 2   diphenhydrAMINE (BENADRYL) 25 MG tablet, Take 50 mg by mouth as needed (prior to infusion)., Disp: , Rfl:    ELIQUIS 2.5 MG TABS tablet, Take 2.5 mg by mouth 2 (two) times daily., Disp: , Rfl:    escitalopram (LEXAPRO) 20 MG tablet, Take 1 tablet (20 mg total) by mouth daily., Disp: 30 tablet, Rfl: 3   furosemide (LASIX) 20 MG tablet, Take 1 tablet (20 mg total) by mouth as needed (in am as needed for swelling)., Disp: 30 tablet, Rfl: 0   gabapentin (NEURONTIN) 300 MG capsule, Take 1 capsule by mouth twice daily, Disp: 60 capsule, Rfl: 5   glipiZIDE (GLUCOTROL) 5 MG tablet, Take 1 tablet (5 mg total) by mouth daily before breakfast., Disp: 30 tablet, Rfl: 5   HYDROcodone-acetaminophen (NORCO) 10-325 MG tablet, Take 1 tablet by mouth every 6 (six) hours as needed., Disp: 120 tablet, Rfl: 0   meclizine (ANTIVERT) 12.5 MG tablet, Take 1 tablet (12.5 mg total) by mouth daily., Disp: 30 tablet, Rfl: 0   midodrine (PROAMATINE) 2.5 MG tablet, Take 1 tablet (2.5 mg total) by mouth 3 (three) times daily  with meals., Disp: 90 tablet, Rfl: 1   Multiple Vitamin (MULTI VITAMIN) TABS, Take 1 tablet by mouth daily., Disp: , Rfl:    pantoprazole (PROTONIX) 40 MG tablet, Take 1 tablet by mouth once daily, Disp: 90 tablet, Rfl: 0   polyethylene glycol (MIRALAX / GLYCOLAX) 17 g packet, Take 17 g by mouth daily as needed  for moderate constipation., Disp: 14 each, Rfl: 0   prochlorperazine (COMPAZINE) 10 MG tablet, Take 1 tablet (10 mg total) by mouth every 6 (six) hours as needed for nausea or vomiting., Disp: 30 tablet, Rfl: 0   rosuvastatin (CRESTOR) 10 MG tablet, Take 10 mg by mouth daily., Disp: , Rfl:    zolpidem (AMBIEN) 10 MG tablet, Take 1 tablet (10 mg total) by mouth at bedtime as needed., Disp: 30 tablet, Rfl: 5   Allergies: Allergies  Allergen Reactions   Ciprofloxacin Anaphylaxis   Amoxicillin Other (See Comments)    unknown   Morphine And Codeine Nausea And Vomiting   Augmentin [Amoxicillin-Pot Clavulanate] Other (See Comments)    Headache, insomnia    REVIEW OF SYSTEMS:   Review of Systems  Constitutional:  Negative for chills, fatigue and fever.  HENT:   Positive for trouble swallowing. Negative for lump/mass, mouth sores, nosebleeds and sore throat.   Eyes:  Negative for eye problems.  Respiratory:  Positive for shortness of breath. Negative for cough.   Cardiovascular:  Negative for chest pain, leg swelling and palpitations.  Gastrointestinal:  Negative for abdominal pain, constipation, diarrhea, nausea and vomiting.  Genitourinary:  Negative for bladder incontinence, difficulty urinating, dysuria, frequency, hematuria and nocturia.   Musculoskeletal:  Positive for back pain. Negative for arthralgias, flank pain, myalgias and neck pain.  Skin:  Negative for itching and rash.  Neurological:  Negative for dizziness, headaches and numbness.  Hematological:  Does not bruise/bleed easily.  Psychiatric/Behavioral:  Positive for depression. Negative for sleep disturbance and suicidal ideas. The patient is nervous/anxious.   All other systems reviewed and are negative.    VITALS:   There were no vitals taken for this visit.  Wt Readings from Last 3 Encounters:  06/10/23 191 lb 11.2 oz (87 kg)  05/27/23 193 lb 3.2 oz (87.6 kg)  05/13/23 190 lb 3.2 oz (86.3 kg)    There is no height  or weight on file to calculate BMI.  Performance status (ECOG): 1 - Symptomatic but completely ambulatory  PHYSICAL EXAM:   Physical Exam Vitals and nursing note reviewed. Exam conducted with a chaperone present.  Constitutional:      Appearance: Normal appearance.  Cardiovascular:     Rate and Rhythm: Normal rate and regular rhythm.     Pulses: Normal pulses.     Heart sounds: Normal heart sounds.  Pulmonary:     Effort: Pulmonary effort is normal.     Breath sounds: Normal breath sounds.  Abdominal:     Palpations: Abdomen is soft. There is no hepatomegaly, splenomegaly or mass.     Tenderness: There is no abdominal tenderness.  Musculoskeletal:     Right lower leg: No edema.     Left lower leg: No edema.  Lymphadenopathy:     Cervical: No cervical adenopathy.     Right cervical: No superficial, deep or posterior cervical adenopathy.    Left cervical: No superficial, deep or posterior cervical adenopathy.     Upper Body:     Right upper body: No supraclavicular or axillary adenopathy.     Left upper body: No  supraclavicular or axillary adenopathy.  Neurological:     General: No focal deficit present.     Mental Status: She is alert and oriented to person, place, and time.  Psychiatric:        Mood and Affect: Mood normal.        Behavior: Behavior normal.     LABS:      Latest Ref Rng & Units 06/10/2023   12:42 PM 05/27/2023    1:14 PM 05/13/2023   12:29 PM  CBC  WBC 4.0 - 10.5 K/uL 3.6  4.3  4.3   Hemoglobin 12.0 - 15.0 g/dL 9.9  9.7  9.9   Hematocrit 36.0 - 46.0 % 31.6  30.4  31.3   Platelets 150 - 400 K/uL 122  139  139       Latest Ref Rng & Units 06/10/2023   12:42 PM 05/27/2023    1:14 PM 05/13/2023   12:29 PM  CMP  Glucose 70 - 99 mg/dL 79  72  161   BUN 8 - 23 mg/dL 27  22  22    Creatinine 0.44 - 1.00 mg/dL 0.96  0.45  4.09   Sodium 135 - 145 mmol/L 135  137  136   Potassium 3.5 - 5.1 mmol/L 4.3  4.1  4.7   Chloride 98 - 111 mmol/L 103  102  100    CO2 22 - 32 mmol/L 28  26  29    Calcium 8.9 - 10.3 mg/dL 8.7  8.6  8.8   Total Protein 6.5 - 8.1 g/dL 6.5  6.4  6.5   Total Bilirubin 0.3 - 1.2 mg/dL 0.3  0.5  0.4   Alkaline Phos 38 - 126 U/L 81  80  92   AST 15 - 41 U/L 23  23  19    ALT 0 - 44 U/L 28  24  25       No results found for: "CEA1", "CEA" / No results found for: "CEA1", "CEA" No results found for: "PSA1" No results found for: "CAN199" No results found for: "CAN125"  Lab Results  Component Value Date   TOTALPROTELP 5.9 (L) 05/27/2023   ALBUMINELP 3.6 05/27/2023   A1GS 0.2 05/27/2023   A2GS 1.0 05/27/2023   BETS 0.9 05/27/2023   GAMS 0.2 (L) 05/27/2023   MSPIKE 0.1 (H) 05/27/2023   SPEI Comment 05/27/2023   Lab Results  Component Value Date   TIBC 290 09/17/2022   TIBC 278 07/30/2022   TIBC 272 05/24/2022   FERRITIN 1,059 (H) 09/17/2022   FERRITIN 313 (H) 07/30/2022   FERRITIN 435 (H) 05/24/2022   IRONPCTSAT 10 (L) 09/17/2022   IRONPCTSAT 21 07/30/2022   IRONPCTSAT 27 05/24/2022   Lab Results  Component Value Date   LDH 129 05/24/2022   LDH 135 03/20/2022   LDH 129 12/14/2021     STUDIES:   No results found.

## 2023-06-10 NOTE — Patient Instructions (Signed)
[facility] at [facility] Discharge Instructions   You were seen and examined today by Dr. Ellin Saba.  He reviewed the results of your lab work which are normal/stable.   We will hold your treatment today.   Return as scheduled.    Thank you for choosing [facility] at [facility] to provide your oncology and hematology care.  To afford each patient quality time with our provider, please arrive at least 15 minutes before your scheduled appointment time.   If you have a lab appointment with the Cancer Center please come in thru the Main Entrance and check in at the main information desk.  You need to re-schedule your appointment should you arrive 10 or more minutes late.  We strive to give you quality time with our providers, and arriving late affects you and other patients whose appointments are after yours.  Also, if you no show three or more times for appointments you may be dismissed from the clinic at the providers discretion.     Again, thank you for choosing [facility].  Our hope is that these requests will decrease the amount of time that you wait before being seen by our physicians.       _____________________________________________________________  Should you have questions after your visit to [facility], please contact our office at [phone] and follow the prompts.  Our office hours are 8:00 a.m. and 4:30 p.m. Monday - Friday.  Please note that voicemails left after 4:00 p.m. may not be returned until the following business day.  We are closed weekends and major holidays.  You do have access to a nurse 24-7, just call the main number to the clinic [phone] and do not press any options, hold on the line and a nurse will answer the phone.    For prescription refill requests, have your pharmacy contact our office and allow 72 hours.    Due to Covid, you will need to wear a mask upon entering the  hospital. If you do not have a mask, a mask will be given to you at the Main Entrance upon arrival. For doctor visits, patients may have 1 support person age 45 or older with them. For treatment visits, patients can not have anyone with them due to social distancing guidelines and our immunocompromised population.

## 2023-06-11 ENCOUNTER — Other Ambulatory Visit: Payer: Self-pay

## 2023-06-11 ENCOUNTER — Ambulatory Visit (HOSPITAL_COMMUNITY)
Admission: RE | Admit: 2023-06-11 | Discharge: 2023-06-11 | Disposition: A | Discharge status: Home / Self Care | Payer: 59 | Source: Ambulatory Visit | Attending: Hematology | Admitting: Hematology

## 2023-06-11 DIAGNOSIS — R531 Weakness: Secondary | ICD-10-CM | POA: Insufficient documentation

## 2023-06-11 DIAGNOSIS — C9000 Multiple myeloma not having achieved remission: Secondary | ICD-10-CM

## 2023-06-11 MED ORDER — GADOBUTROL 1 MMOL/ML IV SOLN
7.0000 mL | Freq: Once | INTRAVENOUS | Status: AC | PRN
  Administered 2023-06-11: 7 mL via INTRAVENOUS

## 2023-06-11 MED ORDER — GADOBUTROL 1 MMOL/ML IV SOLN
10.0000 mL | Freq: Once | INTRAVENOUS | Status: DC | PRN
Start: 2023-06-11 — End: 2023-06-12

## 2023-06-12 ENCOUNTER — Encounter: Payer: Self-pay | Admitting: Hematology

## 2023-06-12 ENCOUNTER — Other Ambulatory Visit: Payer: Self-pay

## 2023-06-12 DIAGNOSIS — C9000 Multiple myeloma not having achieved remission: Secondary | ICD-10-CM

## 2023-06-12 MED ORDER — HYDROCODONE-ACETAMINOPHEN 10-325 MG PO TABS: 1.0000 | ORAL_TABLET | Freq: Four times a day (QID) | ORAL | 0 refills | Status: DC | PRN

## 2023-06-14 ENCOUNTER — Encounter: Payer: Self-pay | Admitting: Hematology

## 2023-06-17 ENCOUNTER — Other Ambulatory Visit: Payer: Self-pay

## 2023-06-18 NOTE — Progress Notes (Signed)
[facility] [address] [place] [address]    Clinic Day:  06/19/2023  Referring physician: Alvina Filbert, MD  Patient Care Team: Alvina Filbert, MD as PCP - General (Internal Medicine) Doreatha Massed, MD as Medical Oncologist (Medical Oncology) Lanelle Bal, DO as Consulting Physician (Gastroenterology)   ASSESSMENT & PLAN:   Assessment: 1.  IgG lambda plasma cell myeloma, stage II, standard risk: -4 cycles of KPD from 04/17/2018 through 08/21/2018, stem cell transplant on 10/23/2018. -PET scan on 01/21/2019 showed multiple bone lesions but without any hypermetabolic activity. -BMBX on 01/21/2019 with normocellular marrow with no increase in plasma cells.  Normal FISH.  MRD results negative. -Maintenance pomalidomide 2 mg 3 weeks on/1 week off started on 03/10/2019. -Bone marrow biopsy on 10/29/2019 shows trilineage hematopoiesis with no evidence of plasma cells.  Chromosome analysis and FISH are normal. -BM BX on 10/27/2020 at [facility]-normocellular marrow with 30 to 40%, TLH.  2% of the total cells are CD 138+ plasma cells. - Pomalyst was held due to dizziness from 03/29/2021 through 04/26/2021. - MRI of the brain on 03/31/2021 did not show any evidence of intracranial lesions.  Multiple bone lesions in the skull compatible with sequela of myeloma.  - Labs at [facility] on 11/16/2021: M spike 0.24 g.  Immunofixation positive for IgG lambda. - PET scan on 12/14/2021: No evidence of FDG avid osseous or soft tissue myeloma. - She has developed left shoulder blade and right posterior rib pain for the last 3 to 4 months. - 24-hour urine on 12/14/2021: Total protein 136 mg.  Immune immunofixation positive for lambda type Bence-Jones protein. -- PET scan (05/24/2022): Single new hypermetabolic lytic lesion involving left lamina of T11.  No canal encroachment.  No other hypermetabolic bone lesions.  Stable diffuse lytic lesions throughout the axial and appendicular skeleton.   No soft tissue lesions. - Maintenance Pomalyst discontinued on 05/29/2022 - XRT to the T11 completed on 06/20/2022 - Daratumumab, Velcade and dexamethasone started on 06/11/2022   2.  Pulmonary embolism: -CT angiogram on 04/21/2020 showed filling defect at [facility]. -VQ scan confirmed pulmonary embolism. -She is on Eliquis.   3.  Right shoulder and upper arm pain: - She received XRT to the chest from 12/26/2020 through 01/06/2021.   4.  Myeloma bone disease: - Denosumab held due to exposure of bone in the right lower jaw.  Last dose in 2020.    Plan: 1.  IgG lambda plasma cell myeloma: - She is receiving Velcade every 2 weeks and Darzalex every 4 weeks and takes dexamethasone 20 mg on days of Darzalex. - Myeloma labs (05/27/2023): M spike is 0.1 g.  FLC ratio is normal at 0.33. - She reported having intermittent stuttering.  She also has intermittent weakness of the right hand and right leg. - I have reviewed MRI of the brain from 06/11/2023: No evidence of abnormality. - Reviewed labs today: Normal LFTs.  Creatinine slightly elevated at 1.69 from her baseline.  CBC shows continued mild microcytic anemia. - Recommend 500 mL normal saline today.  She may continue with her treatment.  RTC 8 weeks for follow-up with repeat myeloma labs. - We have sent a referral to neurology for stuttering and extremity weakness.   2.  Back pain/left shoulder blade pain/right lateral and posterior rib pains: - Continue hydrocodone 10/325 every 6-8 hours as needed.   3.  Hypomagnesemia: - Continue magnesium twice daily.   4.  Anxiety: - Continue Xanax  0.5 mg twice daily.   5.  Sleeping difficulty: - Continue Ambien daily.   6.  Peripheral neuropathy: - Neuropathy in the fingertips and feet is stable.  Continue gabapentin 300 mg 3 times daily.   7.  Pulmonary embolism: - Continue Eliquis twice daily.  No bleeding issues.    Orders Placed This Encounter  Procedures   Kappa/lambda  light chains    Standing Status:   Standing    Number of Occurrences:   20    Standing Expiration Date:   06/18/2024   Protein electrophoresis, serum    Standing Status:   Standing    Number of Occurrences:   20    Standing Expiration Date:   06/18/2024      I,Katie Daubenspeck,acting as a scribe for Doreatha Massed, MD.,have documented all relevant documentation on the behalf of Doreatha Massed, MD,as directed by  Doreatha Massed, MD while in the presence of Doreatha Massed, MD.   I, Doreatha Massed MD, have reviewed the above documentation for accuracy and completeness, and I agree with the above.   Doreatha Massed, MD   10/30/20245:52 PM  CHIEF COMPLAINT:   Diagnosis: multiple myeloma    Cancer Staging  No matching staging information was found for the patient.    Prior Therapy: 1. KPD x 4 cycles from 04/17/2018 to 08/21/2018. 2. Stem cell transplant on 10/23/2018. 3. Radiation to left and right humerus 25 Gy in 10 fractions from 08/23/2020 to 08/25/2020. 4.  Maintenance Pomalyst until 06/08/2022  Current Therapy:  Darzalex, Velcade and dexamethasone started on 06/11/2022    HISTORY OF PRESENT ILLNESS:   Oncology History  Multiple myeloma not having achieved remission (HCC)  04/07/2018 Initial Diagnosis   Multiple myeloma not having achieved remission (HCC)   06/11/2022 -  Chemotherapy   Patient is on Treatment Plan : MYELOMA RELAPSED / REFRACTORY Daratumumab SQ + Bortezomib + Dexamethasone (DaraVd) q21d / Daratumumab SQ q28d      Multiple myeloma without remission (HCC)  04/15/2018 Initial Diagnosis   Multiple myeloma without remission (HCC)   04/17/2018 - 09/05/2018 Chemotherapy   The patient had dexamethasone (DECADRON) 4 MG tablet, 1 of 1 cycle, Start date: 04/15/2018, End date: 05/26/2018 palonosetron (ALOXI) injection 0.25 mg, 0.25 mg, Intravenous,  Once, 1 of 1 cycle Administration: 0.25 mg (04/17/2018), 0.25 mg (04/24/2018), 0.25 mg  (05/01/2018) cyclophosphamide (CYTOXAN) 540 mg in sodium chloride 0.9 % 250 mL chemo infusion, 300 mg/m2 = 540 mg, Intravenous,  Once, 1 of 1 cycle Administration: 540 mg (04/17/2018), 540 mg (04/24/2018), 540 mg (05/01/2018) carfilzomib (KYPROLIS) 36 mg in dextrose 5 % 50 mL chemo infusion, 20 mg/m2 = 36 mg, Intravenous, Once, 5 of 5 cycles Administration: 36 mg (04/17/2018), 36 mg (04/18/2018), 60 mg (04/24/2018), 60 mg (04/25/2018), 60 mg (05/01/2018), 60 mg (05/02/2018), 60 mg (05/15/2018), 60 mg (05/16/2018), 60 mg (06/12/2018), 60 mg (06/13/2018), 60 mg (06/26/2018), 60 mg (06/27/2018), 60 mg (07/03/2018), 60 mg (07/04/2018), 60 mg (07/24/2018), 60 mg (07/25/2018), 60 mg (07/31/2018), 60 mg (08/01/2018), 60 mg (08/08/2018), 60 mg (08/07/2018), 60 mg (08/21/2018), 60 mg (08/22/2018), 60 mg (08/28/2018), 60 mg (08/29/2018), 60 mg (09/04/2018), 60 mg (09/05/2018)  for chemotherapy treatment.    06/11/2022 -  Chemotherapy   Patient is on Treatment Plan : MYELOMA RELAPSED / REFRACTORY Daratumumab SQ + Bortezomib + Dexamethasone (DaraVd) q21d / Daratumumab SQ q28d         INTERVAL HISTORY:   Alexandra Price is a 77 y.o. female presenting to clinic today for follow up  of multiple myeloma. She was last seen by me on 06/10/23.  Since her last visit, she underwent surveillance brain MRI on 06/11/23 showing: no evidence of acute intracranial abnormality.  Today, she states that she is doing well overall. Her appetite level is at 100%. Her energy level is at 50%.  PAST MEDICAL HISTORY:   Past Medical History: Past Medical History:  Diagnosis Date   Anxiety    Chronic kidney disease    Depression    Hypertension    Multiple myeloma (HCC)    multiple myeloma   Pre-diabetes     Surgical History: Past Surgical History:  Procedure Laterality Date   ABDOMINAL HYSTERECTOMY     total   APPENDECTOMY     BALLOON DILATION N/A 05/28/2022   Procedure: BALLOON DILATION;  Surgeon: Lanelle Bal, DO;  [facility]: [facility];   Service: Endoscopy;  Laterality: N/A;   BIOPSY  05/28/2022   Procedure: BIOPSY;  Surgeon: Lanelle Bal, DO;  [facility]: [facility];  Service: Endoscopy;;   COLONOSCOPY WITH PROPOFOL N/A 12/25/2021   Procedure: COLONOSCOPY WITH PROPOFOL;  Surgeon: Lanelle Bal, DO;  [facility]: [facility];  Service: Endoscopy;  Laterality: N/A;  2:30pm   ESOPHAGOGASTRODUODENOSCOPY (EGD) WITH PROPOFOL N/A 05/28/2022   Procedure: ESOPHAGOGASTRODUODENOSCOPY (EGD) WITH PROPOFOL;  Surgeon: Lanelle Bal, DO;  [facility]: [facility];  Service: Endoscopy;  Laterality: N/A;  10:00am, asa 3   LAPAROSCOPIC APPENDECTOMY N/A 05/20/2018   Procedure: APPENDECTOMY LAPAROSCOPIC;  Surgeon: Franky Macho, MD;  Location: [facility];  Service: General;  Laterality: N/A;   POLYPECTOMY  12/25/2021   Procedure: POLYPECTOMY;  Surgeon: Lanelle Bal, DO;  Location: [facility];  Service: Endoscopy;;   PORTACATH PLACEMENT Right 04/14/2018   Procedure: INSERTION PORT-A-CATH;  Surgeon: Franky Macho, MD;  Location: [facility];  Service: General;  Laterality: Right;    Social History: Social History   Socioeconomic History   Marital status: Divorced    Spouse name: Not on file   Number of children: 6   Years of education: Not on file   Highest education level: Not on file  Occupational History    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  Tobacco Use   Smoking status: Never   Smokeless tobacco: Never  Vaping Use   Vaping status: Never Used  Substance and Sexual Activity   Alcohol use: Never   Drug use: Not Currently   Sexual activity: Not Currently  Other Topics Concern   Not on file  Social History Narrative   Not on file   Social Determinants of Health   Financial Resource Strain: Low Risk  (07/06/2020)   Overall Financial Resource Strain (CARDIA)    Difficulty of Paying Living Expenses: Not hard at all  Food Insecurity: No Food Insecurity (09/17/2022)   Hunger Vital Sign    Worried About Running Out of Food in the  Last Year: Never true    Ran Out of Food in the Last Year: Never true  Transportation Needs: No Transportation Needs (09/17/2022)   PRAPARE - Administrator, Civil Service (Medical): No    Lack of Transportation (Non-Medical): No  Physical Activity: Inactive (07/06/2020)   Exercise Vital Sign    Days of Exercise per Week: 0 days    Minutes of Exercise per Session: 0 min  Stress: No Stress Concern Present (07/06/2020)   [employer] of Occupational Health - Occupational Stress Questionnaire    Feeling of Stress : Not at all  Social  Connections: Moderately Isolated (07/06/2020)   Social Connection and Isolation Panel [NHANES]    Frequency of Communication with Friends and Family: More than three times a week    Frequency of Social Gatherings with Friends and Family: Twice a week    Attends Religious Services: 1 to 4 times per year    Active Member of [employer] or Organizations: No    Attends Banker Meetings: Never    Marital Status: Separated  Intimate Partner Violence: Not At Risk (09/17/2022)   Humiliation, Afraid, Rape, and Kick questionnaire    Fear of Current or Ex-Partner: No    Emotionally Abused: No    Physically Abused: No    Sexually Abused: No    Family History: Family History  Problem Relation Age of Onset   Heart disease Mother    Emphysema Father    Diabetes Sister    Depression Sister    Cancer Brother        liver, lung, and colon. colon cancer at age 65.   Diabetes Brother     Current Medications:  Current Outpatient Medications:    acetaminophen (TYLENOL) 325 MG tablet, Take 650 mg by mouth as needed for moderate pain (prior to infusion)., Disp: , Rfl:    acyclovir (ZOVIRAX) 400 MG tablet, Take 1 tablet (400 mg total) by mouth 2 (two) times daily., Disp: 60 tablet, Rfl: 6   ALPRAZolam (XANAX) 0.25 MG tablet, Take one tablet (0.25 mg) by mouth in the morning as needed, and two tablets (0.5 mg) at bedtime as needed, Disp: 90 tablet,  Rfl: 2   Calcium Carb-Cholecalciferol (CALCIUM 1000 + D PO), Take 1,000 mg by mouth daily., Disp: , Rfl:    chlorthalidone (HYGROTON) 25 MG tablet, Take 25 mg by mouth every morning., Disp: , Rfl:    dexAMETHasone 20 MG TABS, Take 1 tablet by mouth as directed. Take 1 tablet 30 minutes prior to infusions (Patient taking differently: Take 1 tablet by mouth as directed. Take 1 tablet 30 minutes prior to the Darzelex injections-once a month), Disp: 30 tablet, Rfl: 2   diphenhydrAMINE (BENADRYL) 25 MG tablet, Take 50 mg by mouth as needed (prior to infusion)., Disp: , Rfl:    ELIQUIS 2.5 MG TABS tablet, Take 2.5 mg by mouth 2 (two) times daily., Disp: , Rfl:    escitalopram (LEXAPRO) 20 MG tablet, Take 1 tablet by mouth once daily, Disp: 30 tablet, Rfl: 0   furosemide (LASIX) 20 MG tablet, Take 1 tablet (20 mg total) by mouth as needed (in am as needed for swelling)., Disp: 30 tablet, Rfl: 0   gabapentin (NEURONTIN) 300 MG capsule, Take 1 capsule by mouth twice daily, Disp: 60 capsule, Rfl: 5   glipiZIDE (GLUCOTROL) 5 MG tablet, Take 1 tablet (5 mg total) by mouth daily before breakfast., Disp: 30 tablet, Rfl: 5   HYDROcodone-acetaminophen (NORCO) 10-325 MG tablet, Take 1 tablet by mouth every 6 (six) hours as needed., Disp: 120 tablet, Rfl: 0   meclizine (ANTIVERT) 12.5 MG tablet, Take 1 tablet (12.5 mg total) by mouth daily., Disp: 30 tablet, Rfl: 0   midodrine (PROAMATINE) 2.5 MG tablet, Take 1 tablet (2.5 mg total) by mouth 3 (three) times daily with meals., Disp: 90 tablet, Rfl: 1   Multiple Vitamin (MULTI VITAMIN) TABS, Take 1 tablet by mouth daily., Disp: , Rfl:    pantoprazole (PROTONIX) 40 MG tablet, Take 1 tablet by mouth once daily, Disp: 90 tablet, Rfl: 0   polyethylene glycol (MIRALAX /  GLYCOLAX) 17 g packet, Take 17 g by mouth daily as needed for moderate constipation., Disp: 14 each, Rfl: 0   prochlorperazine (COMPAZINE) 10 MG tablet, Take 1 tablet (10 mg total) by mouth every 6 (six)  hours as needed for nausea or vomiting., Disp: 30 tablet, Rfl: 0   rosuvastatin (CRESTOR) 10 MG tablet, Take 10 mg by mouth daily., Disp: , Rfl:    zolpidem (AMBIEN) 10 MG tablet, Take 1 tablet (10 mg total) by mouth at bedtime as needed., Disp: 30 tablet, Rfl: 5   Allergies: Allergies  Allergen Reactions   Ciprofloxacin Anaphylaxis   Amoxicillin Other (See Comments)    unknown   Morphine And Codeine Nausea And Vomiting   Augmentin [Amoxicillin-Pot Clavulanate] Other (See Comments)    Headache, insomnia    REVIEW OF SYSTEMS:   Review of Systems  Constitutional:  Negative for chills, fatigue and fever.  HENT:   Negative for lump/mass, mouth sores, nosebleeds, sore throat and trouble swallowing.   Eyes:  Negative for eye problems.  Respiratory:  Positive for shortness of breath. Negative for cough.   Cardiovascular:  Negative for chest pain, leg swelling and palpitations.  Gastrointestinal:  Negative for abdominal pain, constipation, diarrhea, nausea and vomiting.  Genitourinary:  Negative for bladder incontinence, difficulty urinating, dysuria, frequency, hematuria and nocturia.   Musculoskeletal:  Negative for arthralgias, back pain, flank pain, myalgias and neck pain.  Skin:  Negative for itching and rash.  Neurological:  Positive for dizziness, headaches and numbness.  Hematological:  Does not bruise/bleed easily.  Psychiatric/Behavioral:  Positive for sleep disturbance. Negative for depression and suicidal ideas. The patient is not nervous/anxious.   All other systems reviewed and are negative.    VITALS:   There were no vitals taken for this visit.  Wt Readings from Last 3 Encounters:  06/19/23 189 lb 1.6 oz (85.8 kg)  06/10/23 191 lb 11.2 oz (87 kg)  05/27/23 193 lb 3.2 oz (87.6 kg)    There is no height or weight on file to calculate BMI.  Performance status (ECOG): 1 - Symptomatic but completely ambulatory  PHYSICAL EXAM:   Physical Exam Vitals and nursing note  reviewed. Exam conducted with a chaperone present.  Constitutional:      Appearance: Normal appearance.  Cardiovascular:     Rate and Rhythm: Normal rate and regular rhythm.     Pulses: Normal pulses.     Heart sounds: Normal heart sounds.  Pulmonary:     Effort: Pulmonary effort is normal.     Breath sounds: Normal breath sounds.  Abdominal:     Palpations: Abdomen is soft. There is no hepatomegaly, splenomegaly or mass.     Tenderness: There is no abdominal tenderness.  Musculoskeletal:     Right lower leg: No edema.     Left lower leg: No edema.  Lymphadenopathy:     Cervical: No cervical adenopathy.     Right cervical: No superficial, deep or posterior cervical adenopathy.    Left cervical: No superficial, deep or posterior cervical adenopathy.     Upper Body:     Right upper body: No supraclavicular or axillary adenopathy.     Left upper body: No supraclavicular or axillary adenopathy.  Neurological:     General: No focal deficit present.     Mental Status: She is alert and oriented to person, place, and time.  Psychiatric:        Mood and Affect: Mood normal.  Behavior: Behavior normal.     LABS:      Latest Ref Rng & Units 06/19/2023    8:01 AM 06/10/2023   12:42 PM 05/27/2023    1:14 PM  CBC  WBC 4.0 - 10.5 K/uL 4.3  3.6  4.3   Hemoglobin 12.0 - 15.0 g/dL 9.9  9.9  9.7   Hematocrit 36.0 - 46.0 % 33.1  31.6  30.4   Platelets 150 - 400 K/uL 133  122  139       Latest Ref Rng & Units 06/19/2023    8:01 AM 06/10/2023   12:42 PM 05/27/2023    1:14 PM  CMP  Glucose 70 - 99 mg/dL 161  79  72   BUN 8 - 23 mg/dL 33  27  22   Creatinine 0.44 - 1.00 mg/dL 0.96  0.45  4.09   Sodium 135 - 145 mmol/L 138  135  137   Potassium 3.5 - 5.1 mmol/L 4.7  4.3  4.1   Chloride 98 - 111 mmol/L 100  103  102   CO2 22 - 32 mmol/L 28  28  26    Calcium 8.9 - 10.3 mg/dL 8.7  8.7  8.6   Total Protein 6.5 - 8.1 g/dL 6.4  6.5  6.4   Total Bilirubin 0.3 - 1.2 mg/dL 0.6  0.3  0.5    Alkaline Phos 38 - 126 U/L 84  81  80   AST 15 - 41 U/L 21  23  23    ALT 0 - 44 U/L 29  28  24       No results found for: "CEA1", "CEA" / No results found for: "CEA1", "CEA" No results found for: "PSA1" No results found for: "CAN199" No results found for: "CAN125"  Lab Results  Component Value Date   TOTALPROTELP 5.9 (L) 05/27/2023   ALBUMINELP 3.6 05/27/2023   A1GS 0.2 05/27/2023   A2GS 1.0 05/27/2023   BETS 0.9 05/27/2023   GAMS 0.2 (L) 05/27/2023   MSPIKE 0.1 (H) 05/27/2023   SPEI Comment 05/27/2023   Lab Results  Component Value Date   TIBC 290 09/17/2022   TIBC 278 07/30/2022   TIBC 272 05/24/2022   FERRITIN 1,059 (H) 09/17/2022   FERRITIN 313 (H) 07/30/2022   FERRITIN 435 (H) 05/24/2022   IRONPCTSAT 10 (L) 09/17/2022   IRONPCTSAT 21 07/30/2022   IRONPCTSAT 27 05/24/2022   Lab Results  Component Value Date   LDH 129 05/24/2022   LDH 135 03/20/2022   LDH 129 12/14/2021     STUDIES:   MR Brain W Wo Contrast  Result Date: 06/18/2023 CLINICAL DATA:  right sided weakness; new onset stuttering. EXAM: MRI HEAD WITHOUT AND WITH CONTRAST TECHNIQUE: Multiplanar, multiecho pulse sequences of the brain and surrounding structures were obtained without and with intravenous contrast. CONTRAST:  7mL GADAVIST GADOBUTROL 1 MMOL/ML IV SOLN COMPARISON:  MRI head 03/11/2023. FINDINGS: Brain: No acute infarction, hemorrhage, hydrocephalus, extra-axial collection or mass lesion. No pathologic enhancement. Mild scattered T2/FLAIR hyperintensities in the white matter are nonspecific but considered within normal limits for patient age. Vascular: Major arterial flow voids are maintained at the skull base. Skull and upper cervical spine: Similar appearance of multiple calvarial lesions. Sinuses/Orbits: Clear sinuses.  No acute orbital findings. IMPRESSION: No evidence of acute intracranial abnormality. Electronically Signed   By: Feliberto Harts M.D.   On: 06/18/2023 14:14

## 2023-06-19 ENCOUNTER — Inpatient Hospital Stay (HOSPITAL_BASED_OUTPATIENT_CLINIC_OR_DEPARTMENT_OTHER): Payer: 59 | Admitting: Hematology

## 2023-06-19 ENCOUNTER — Inpatient Hospital Stay: Payer: 59

## 2023-06-19 ENCOUNTER — Other Ambulatory Visit: Payer: Self-pay | Admitting: Hematology

## 2023-06-19 DIAGNOSIS — C9000 Multiple myeloma not having achieved remission: Secondary | ICD-10-CM

## 2023-06-19 DIAGNOSIS — R7989 Other specified abnormal findings of blood chemistry: Secondary | ICD-10-CM

## 2023-06-19 DIAGNOSIS — Z95828 Presence of other vascular implants and grafts: Secondary | ICD-10-CM

## 2023-06-19 DIAGNOSIS — Z5112 Encounter for antineoplastic immunotherapy: Secondary | ICD-10-CM | POA: Diagnosis not present

## 2023-06-19 LAB — CBC WITH DIFFERENTIAL/PLATELET
Abs Immature Granulocytes: 0 10*3/uL (ref 0.00–0.07)
Basophils Absolute: 0 10*3/uL (ref 0.0–0.1)
Basophils Relative: 1 %
Eosinophils Absolute: 0.2 10*3/uL (ref 0.0–0.5)
Eosinophils Relative: 4 %
HCT: 33.1 % — ABNORMAL LOW (ref 36.0–46.0)
Hemoglobin: 9.9 g/dL — ABNORMAL LOW (ref 12.0–15.0)
Immature Granulocytes: 0 %
Lymphocytes Relative: 13 %
Lymphs Abs: 0.6 10*3/uL — ABNORMAL LOW (ref 0.7–4.0)
MCH: 30.7 pg (ref 26.0–34.0)
MCHC: 29.9 g/dL — ABNORMAL LOW (ref 30.0–36.0)
MCV: 102.5 fL — ABNORMAL HIGH (ref 80.0–100.0)
Monocytes Absolute: 0.5 10*3/uL (ref 0.1–1.0)
Monocytes Relative: 11 %
Neutro Abs: 3.1 10*3/uL (ref 1.7–7.7)
Neutrophils Relative %: 71 %
Platelets: 133 10*3/uL — ABNORMAL LOW (ref 150–400)
RBC: 3.23 MIL/uL — ABNORMAL LOW (ref 3.87–5.11)
RDW: 12.8 % (ref 11.5–15.5)
WBC: 4.3 10*3/uL (ref 4.0–10.5)
nRBC: 0 % (ref 0.0–0.2)

## 2023-06-19 LAB — COMPREHENSIVE METABOLIC PANEL
ALT: 29 U/L (ref 0–44)
AST: 21 U/L (ref 15–41)
Albumin: 4 g/dL (ref 3.5–5.0)
Alkaline Phosphatase: 84 U/L (ref 38–126)
Anion gap: 10 (ref 5–15)
BUN: 33 mg/dL — ABNORMAL HIGH (ref 8–23)
CO2: 28 mmol/L (ref 22–32)
Calcium: 8.7 mg/dL — ABNORMAL LOW (ref 8.9–10.3)
Chloride: 100 mmol/L (ref 98–111)
Creatinine, Ser: 1.69 mg/dL — ABNORMAL HIGH (ref 0.44–1.00)
GFR, Estimated: 31 mL/min — ABNORMAL LOW (ref >60)
Glucose, Bld: 151 mg/dL — ABNORMAL HIGH (ref 70–99)
Potassium: 4.7 mmol/L (ref 3.5–5.1)
Sodium: 138 mmol/L (ref 135–145)
Total Bilirubin: 0.6 mg/dL (ref 0.3–1.2)
Total Protein: 6.4 g/dL — ABNORMAL LOW (ref 6.5–8.1)

## 2023-06-19 LAB — MAGNESIUM: Magnesium: 2.5 mg/dL — ABNORMAL HIGH (ref 1.7–2.4)

## 2023-06-19 MED ORDER — HEPARIN SOD (PORK) LOCK FLUSH 100 UNIT/ML IV SOLN
500.0000 [IU] | Freq: Once | INTRAVENOUS | Status: AC
  Administered 2023-06-19: 500 [IU] via INTRAVENOUS

## 2023-06-19 MED ORDER — SODIUM CHLORIDE 0.9% FLUSH
10.0000 mL | Freq: Once | INTRAVENOUS | Status: AC
  Administered 2023-06-19: 10 mL via INTRAVENOUS

## 2023-06-19 MED ORDER — SODIUM CHLORIDE 0.9 % IV SOLN: Freq: Once | INTRAVENOUS | Status: AC

## 2023-06-19 MED ORDER — SODIUM CHLORIDE 0.9% FLUSH
10.0000 mL | INTRAVENOUS | Status: DC | PRN
  Administered 2023-06-19: 10 mL via INTRAVENOUS

## 2023-06-19 MED ORDER — BORTEZOMIB CHEMO SQ INJECTION 3.5 MG (2.5MG/ML)
1.0000 mg/m2 | Freq: Once | INTRAMUSCULAR | Status: AC
  Administered 2023-06-19: 2 mg via SUBCUTANEOUS
  Filled 2023-06-19: qty 0.8

## 2023-06-19 MED ORDER — DARATUMUMAB-HYALURONIDASE-FIHJ 1800-30000 MG-UT/15ML ~~LOC~~ SOLN
1800.0000 mg | Freq: Once | SUBCUTANEOUS | Status: AC
  Administered 2023-06-19: 1800 mg via SUBCUTANEOUS
  Filled 2023-06-19: qty 15

## 2023-06-19 NOTE — Progress Notes (Signed)
Patient has been examined by Dr. Ellin Saba. Vital signs and labs have been reviewed by MD - ANC, Creatinine (1.69), LFTs, hemoglobin, and platelets are within treatment parameters per M.D. - pt may proceed with treatment.  Primary RN and pharmacy notified.

## 2023-06-19 NOTE — Progress Notes (Signed)
Patients port flushed without difficulty.  Good blood return noted with no bruising or swelling noted at site.  Band aid applied.  VSS with discharge and left in satisfactory condition with no s/s of distress noted.   

## 2023-06-19 NOTE — Patient Instructions (Signed)
[facility]  Discharge Instructions: Thank you for choosing [facility] to provide your oncology and hematology care.  If you have a lab appointment with the Cancer Center - please note that after April 8th, 2024, all labs will be drawn in the cancer center.  You do not have to check in or register with the main entrance as you have in the past but will complete your check-in in the cancer center.  Wear comfortable clothing and clothing appropriate for easy access to any Portacath or PICC line.   We strive to give you quality time with your provider. You may need to reschedule your appointment if you arrive late (15 or more minutes).  Arriving late affects you and other patients whose appointments are after yours.  Also, if you miss three or more appointments without notifying the office, you may be dismissed from the clinic at the provider's discretion.      For prescription refill requests, have your pharmacy contact our office and allow 72 hours for refills to be completed.    Today you received the following chemotherapy and/or immunotherapy agents Daratumumab/Velcade.  Daratumumab; Hyaluronidase Injection What is this medication? DARATUMUMAB; HYALURONIDASE (dar a toom ue mab; hye al ur ON i dase) treats multiple myeloma, a type of bone marrow cancer. Daratumumab works by blocking a protein that causes cancer cells to grow and multiply. This helps to slow or stop the spread of cancer cells. Hyaluronidase works by increasing the absorption of other medications in the body to help them work better. This medication may also be used treat amyloidosis, a condition that causes the buildup of a protein (amyloid) in your body. It works by reducing the buildup of this protein, which decreases symptoms. It is a combination medication that contains a monoclonal antibody. This medicine may be used for other purposes; ask your health care provider or pharmacist if you have  questions. COMMON BRAND NAME(S): DARZALEX FASPRO What should I tell my care team before I take this medication? They need to know if you have any of these conditions: Heart disease Infection, such as chickenpox, cold sores, herpes, hepatitis B Lung or breathing disease An unusual or allergic reaction to daratumumab, hyaluronidase, other medications, foods, dyes, or preservatives Pregnant or trying to get pregnant Breast-feeding How should I use this medication? This medication is injected under the skin. It is given by your care team in a hospital or clinic setting. Talk to your care team about the use of this medication in children. Special care may be needed. Overdosage: If you think you have taken too much of this medicine contact a poison control center or emergency room at once. NOTE: This medicine is only for you. Do not share this medicine with others. What if I miss a dose? Keep appointments for follow-up doses. It is important not to miss your dose. Call your care team if you are unable to keep an appointment. What may interact with this medication? Interactions have not been studied. This list may not describe all possible interactions. Give your health care provider a list of all the medicines, herbs, non-prescription drugs, or dietary supplements you use. Also tell them if you smoke, drink alcohol, or use illegal drugs. Some items may interact with your medicine. What should I watch for while using this medication? Your condition will be monitored carefully while you are receiving this medication. This medication can cause serious allergic reactions. To reduce your risk, your care team may  give you other medication to take before receiving this one. Be sure to follow the directions from your care team. This medication can affect the results of blood tests to match your blood type. These changes can last for up to 6 months after the final dose. Your care team will do blood tests to  match your blood type before you start treatment. Tell all of your care team that you are being treated with this medication before receiving a blood transfusion. This medication can affect the results of some tests used to determine treatment response; extra tests may be needed to evaluate response. Talk to your care team if you wish to become pregnant or think you are pregnant. This medication can cause serious birth defects if taken during pregnancy and for 3 months after the last dose. A reliable form of contraception is recommended while taking this medication and for 3 months after the last dose. Talk to your care team about effective forms of contraception. Do not breast-feed while taking this medication. What side effects may I notice from receiving this medication? Side effects that you should report to your care team as soon as possible: Allergic reactions--skin rash, itching, hives, swelling of the face, lips, tongue, or throat Heart rhythm changes--fast or irregular heartbeat, dizziness, feeling faint or lightheaded, chest pain, trouble breathing Infection--fever, chills, cough, sore throat, wounds that don't heal, pain or trouble when passing urine, general feeling of discomfort or being unwell Infusion reactions--chest pain, shortness of breath or trouble breathing, feeling faint or lightheaded Sudden eye pain or change in vision such as blurry vision, seeing halos around lights, vision loss Unusual bruising or bleeding Side effects that usually do not require medical attention (report to your care team if they continue or are bothersome): Constipation Diarrhea Fatigue Nausea Pain, tingling, or numbness in the hands or feet Swelling of the ankles, hands, or feet This list may not describe all possible side effects. Call your doctor for medical advice about side effects. You may report side effects to FDA at 1-800-FDA-1088. Where should I keep my medication? This medication is given  in a hospital or clinic. It will not be stored at home. NOTE: This sheet is a summary. It may not cover all possible information. If you have questions about this medicine, talk to your doctor, pharmacist, or health care provider.  2024 Elsevier/Gold Standard (2021-12-12 00:00:00)    Bortezomib Injection What is this medication? BORTEZOMIB (bor TEZ oh mib) treats lymphoma. It may also be used to treat multiple myeloma, a type of bone marrow cancer. It works by blocking a protein that causes cancer cells to grow and multiply. This helps to slow or stop the spread of cancer cells. This medicine may be used for other purposes; ask your health care provider or pharmacist if you have questions. COMMON BRAND NAME(S): Velcade What should I tell my care team before I take this medication? They need to know if you have any of these conditions: Dehydration Diabetes Heart disease Liver disease Tingling of the fingers or toes or other nerve disorder An unusual or allergic reaction to bortezomib, other medications, foods, dyes, or preservatives If you or your partner are pregnant or trying to get pregnant Breastfeeding How should I use this medication? This medication is injected into a vein or under the skin. It is given by your care team in a hospital or clinic setting. Talk to your care team about the use of this medication in children. Special care may be  needed. Overdosage: If you think you have taken too much of this medicine contact a poison control center or emergency room at once. NOTE: This medicine is only for you. Do not share this medicine with others. What if I miss a dose? Keep appointments for follow-up doses. It is important not to miss your dose. Call your care team if you are unable to keep an appointment. What may interact with this medication? Ketoconazole Rifampin This list may not describe all possible interactions. Give your health care provider a list of all the medicines,  herbs, non-prescription drugs, or dietary supplements you use. Also tell them if you smoke, drink alcohol, or use illegal drugs. Some items may interact with your medicine. What should I watch for while using this medication? Your condition will be monitored carefully while you are receiving this medication. You may need blood work while taking this medication. This medication may affect your coordination, reaction time, or judgment. Do not drive or operate machinery until you know how this medication affects you. Sit up or stand slowly to reduce the risk of dizzy or fainting spells. Drinking alcohol with this medication can increase the risk of these side effects. This medication may increase your risk of getting an infection. Call your care team for advice if you get a fever, chills, sore throat, or other symptoms of a cold or flu. Do not treat yourself. Try to avoid being around people who are sick. Check with your care team if you have severe diarrhea, nausea, and vomiting, or if you sweat a lot. The loss of too much body fluid may make it dangerous for you to take this medication. Talk to your care team if you may be pregnant. Serious birth defects can occur if you take this medication during pregnancy and for 7 months after the last dose. You will need a negative pregnancy test before starting this medication. Contraception is recommended while taking this medication and for 7 months after the last dose. Your care team can help you find the option that works for you. If your partner can get pregnant, use a condom during sex while taking this medication and for 4 months after the last dose. Do not breastfeed while taking this medication and for 2 months after the last dose. This medication may cause infertility. Talk to your care team if you are concerned about your fertility. What side effects may I notice from receiving this medication? Side effects that you should report to your care team as soon  as possible: Allergic reactions--skin rash, itching, hives, swelling of the face, lips, tongue, or throat Bleeding--bloody or black, tar-like stools, vomiting blood or Franca Stakes material that looks like coffee grounds, red or dark Unity Luepke urine, small red or purple spots on skin, unusual bruising or bleeding Bleeding in the brain--severe headache, stiff neck, confusion, dizziness, change in vision, numbness or weakness of the face, arm, or leg, trouble speaking, trouble walking, vomiting Bowel blockage--stomach cramping, unable to have a bowel movement or pass gas, loss of appetite, vomiting Heart failure--shortness of breath, swelling of the ankles, feet, or hands, sudden weight gain, unusual weakness or fatigue Infection--fever, chills, cough, sore throat, wounds that don't heal, pain or trouble when passing urine, general feeling of discomfort or being unwell Liver injury--right upper belly pain, loss of appetite, nausea, light-colored stool, dark yellow or Carla Whilden urine, yellowing skin or eyes, unusual weakness or fatigue Low blood pressure--dizziness, feeling faint or lightheaded, blurry vision Lung injury--shortness of breath or trouble breathing,  cough, spitting up blood, chest pain, fever Pain, tingling, or numbness in the hands or feet Severe or prolonged diarrhea Stomach pain, bloody diarrhea, pale skin, unusual weakness or fatigue, decrease in the amount of urine, which may be signs of hemolytic uremic syndrome Sudden and severe headache, confusion, change in vision, seizures, which may be signs of posterior reversible encephalopathy syndrome (PRES) TTP--purple spots on the skin or inside the mouth, pale skin, yellowing skin or eyes, unusual weakness or fatigue, fever, fast or irregular heartbeat, confusion, change in vision, trouble speaking, trouble walking Tumor lysis syndrome (TLS)--nausea, vomiting, diarrhea, decrease in the amount of urine, dark urine, unusual weakness or fatigue, confusion,  muscle pain or cramps, fast or irregular heartbeat, joint pain Side effects that usually do not require medical attention (report to your care team if they continue or are bothersome): Constipation Diarrhea Fatigue Loss of appetite Nausea This list may not describe all possible side effects. Call your doctor for medical advice about side effects. You may report side effects to FDA at 1-800-FDA-1088. Where should I keep my medication? This medication is given in a hospital or clinic. It will not be stored at home. NOTE: This sheet is a summary. It may not cover all possible information. If you have questions about this medicine, talk to your doctor, pharmacist, or health care provider.  2024 Elsevier/Gold Standard (2022-01-09 00:00:00)        To help prevent nausea and vomiting after your treatment, we encourage you to take your nausea medication as directed.  BELOW ARE SYMPTOMS THAT SHOULD BE REPORTED IMMEDIATELY: *FEVER GREATER THAN 100.4 F (38 C) OR HIGHER *CHILLS OR SWEATING *NAUSEA AND VOMITING THAT IS NOT CONTROLLED WITH YOUR NAUSEA MEDICATION *UNUSUAL SHORTNESS OF BREATH *UNUSUAL BRUISING OR BLEEDING *URINARY PROBLEMS (pain or burning when urinating, or frequent urination) *BOWEL PROBLEMS (unusual diarrhea, constipation, pain near the anus) TENDERNESS IN MOUTH AND THROAT WITH OR WITHOUT PRESENCE OF ULCERS (sore throat, sores in mouth, or a toothache) UNUSUAL RASH, SWELLING OR PAIN  UNUSUAL VAGINAL DISCHARGE OR ITCHING   Items with * indicate a potential emergency and should be followed up as soon as possible or go to the Emergency Department if any problems should occur.  Please show the CHEMOTHERAPY ALERT CARD or IMMUNOTHERAPY ALERT CARD at check-in to the Emergency Department and triage nurse.  Should you have questions after your visit or need to cancel or reschedule your appointment, please contact [facility] AT [facility] [phone]  and follow the prompts.   Office hours are 8:00 a.m. to 4:30 p.m. Monday - Friday. Please note that voicemails left after 4:00 p.m. may not be returned until the following business day.  We are closed weekends and major holidays. You have access to a nurse at all times for urgent questions. Please call the main number to the clinic [phone] and follow the prompts.  For any non-urgent questions, you may also contact your provider using MyChart. We now offer e-Visits for anyone 13 and older to request care online for non-urgent symptoms. For details visit [email or web address].   Also download the MyChart app! Go to the app store, search "MyChart", open the app, select [place], and log in with your MyChart username and password.

## 2023-06-19 NOTE — Progress Notes (Signed)
Patient presents today for Velcade and Daratumumab injections. Patient is in satisfactory condition with no new complaints voiced.  Vital signs are stable.  Labs reviewed by Dr. Ellin Saba during the office visit.  Creatinine today is 1.69.  MD aware.  We will give NS 500 mL over one hour per Dr. Ellin Saba.  All other labs are within treatment parameters.  Pre-medications taken at 0830 at home prior to visit.  Patient only took 25 mg of Benadryl instead of the ordered 50 mg.  This was discussed with Alexandra Price, [facility] and decided 25 mg was suitable for her age.  We will proceed with treatment per MD orders.   Patient tolerated IVF and injections well with no complaints voiced.  Patient left ambulatory in stable condition.  Vital signs stable at discharge.  Follow up as scheduled.

## 2023-06-19 NOTE — Patient Instructions (Signed)
[facility] at [facility] Discharge Instructions   You were seen and examined today by Dr. Ellin Saba.  He reviewed the results of your lab work which are normal/stable.   He reviewed the results of your MRI of the brain which was normal.   We will proceed with your treatment today.   We will make a referral for you to neurology.   Return as scheduled.    Thank you for choosing [facility] at [facility] to provide your oncology and hematology care.  To afford each patient quality time with our provider, please arrive at least 15 minutes before your scheduled appointment time.   If you have a lab appointment with the Cancer Center please come in thru the Main Entrance and check in at the main information desk.  You need to re-schedule your appointment should you arrive 10 or more minutes late.  We strive to give you quality time with our providers, and arriving late affects you and other patients whose appointments are after yours.  Also, if you no show three or more times for appointments you may be dismissed from the clinic at the providers discretion.     Again, thank you for choosing [facility].  Our hope is that these requests will decrease the amount of time that you wait before being seen by our physicians.       _____________________________________________________________  Should you have questions after your visit to [facility], please contact our office at [phone] and follow the prompts.  Our office hours are 8:00 a.m. and 4:30 p.m. Monday - Friday.  Please note that voicemails left after 4:00 p.m. may not be returned until the following business day.  We are closed weekends and major holidays.  You do have access to a nurse 24-7, just call the main number to the clinic [phone] and do not press any options, hold on the line and a nurse will answer the phone.    For prescription refill requests,  have your pharmacy contact our office and allow 72 hours.    Due to Covid, you will need to wear a mask upon entering the hospital. If you do not have a mask, a mask will be given to you at the Main Entrance upon arrival. For doctor visits, patients may have 1 support person age 3 or older with them. For treatment visits, patients can not have anyone with them due to social distancing guidelines and our immunocompromised population.

## 2023-06-25 ENCOUNTER — Inpatient Hospital Stay: Payer: 59

## 2023-06-25 ENCOUNTER — Other Ambulatory Visit: Payer: 59

## 2023-06-25 ENCOUNTER — Inpatient Hospital Stay: Payer: 59 | Admitting: Hematology

## 2023-06-25 ENCOUNTER — Ambulatory Visit: Payer: 59

## 2023-07-02 ENCOUNTER — Encounter: Payer: Self-pay | Admitting: Hematology

## 2023-07-03 ENCOUNTER — Inpatient Hospital Stay: Payer: 59 | Attending: Hematology

## 2023-07-03 ENCOUNTER — Inpatient Hospital Stay: Payer: 59

## 2023-07-03 VITALS — Ht 64.0 in

## 2023-07-03 DIAGNOSIS — Z5112 Encounter for antineoplastic immunotherapy: Secondary | ICD-10-CM | POA: Insufficient documentation

## 2023-07-03 DIAGNOSIS — C9000 Multiple myeloma not having achieved remission: Secondary | ICD-10-CM

## 2023-07-03 LAB — CBC WITH DIFFERENTIAL/PLATELET
Abs Immature Granulocytes: 0.01 10*3/uL (ref 0.00–0.07)
Basophils Absolute: 0 10*3/uL (ref 0.0–0.1)
Basophils Relative: 1 %
Eosinophils Absolute: 0.1 10*3/uL (ref 0.0–0.5)
Eosinophils Relative: 4 %
HCT: 31.8 % — ABNORMAL LOW (ref 36.0–46.0)
Hemoglobin: 10 g/dL — ABNORMAL LOW (ref 12.0–15.0)
Immature Granulocytes: 0 %
Lymphocytes Relative: 16 %
Lymphs Abs: 0.5 10*3/uL — ABNORMAL LOW (ref 0.7–4.0)
MCH: 31.6 pg (ref 26.0–34.0)
MCHC: 31.4 g/dL (ref 30.0–36.0)
MCV: 100.6 fL — ABNORMAL HIGH (ref 80.0–100.0)
Monocytes Absolute: 0.3 10*3/uL (ref 0.1–1.0)
Monocytes Relative: 11 %
Neutro Abs: 2.2 10*3/uL (ref 1.7–7.7)
Neutrophils Relative %: 68 %
Platelets: 136 10*3/uL — ABNORMAL LOW (ref 150–400)
RBC: 3.16 MIL/uL — ABNORMAL LOW (ref 3.87–5.11)
RDW: 12.8 % (ref 11.5–15.5)
WBC: 3.2 10*3/uL — ABNORMAL LOW (ref 4.0–10.5)
nRBC: 0 % (ref 0.0–0.2)

## 2023-07-03 LAB — COMPREHENSIVE METABOLIC PANEL
ALT: 34 U/L (ref 0–44)
AST: 25 U/L (ref 15–41)
Albumin: 4 g/dL (ref 3.5–5.0)
Alkaline Phosphatase: 81 U/L (ref 38–126)
Anion gap: 9 (ref 5–15)
BUN: 29 mg/dL — ABNORMAL HIGH (ref 8–23)
CO2: 26 mmol/L (ref 22–32)
Calcium: 8.9 mg/dL (ref 8.9–10.3)
Chloride: 102 mmol/L (ref 98–111)
Creatinine, Ser: 1.41 mg/dL — ABNORMAL HIGH (ref 0.44–1.00)
GFR, Estimated: 39 mL/min — ABNORMAL LOW (ref >60)
Glucose, Bld: 159 mg/dL — ABNORMAL HIGH (ref 70–99)
Potassium: 4.3 mmol/L (ref 3.5–5.1)
Sodium: 137 mmol/L (ref 135–145)
Total Bilirubin: 0.4 mg/dL (ref <1.2)
Total Protein: 6.3 g/dL — ABNORMAL LOW (ref 6.5–8.1)

## 2023-07-03 MED ORDER — SODIUM CHLORIDE 0.9% FLUSH
10.0000 mL | INTRAVENOUS | Status: DC | PRN
  Administered 2023-07-03: 10 mL via INTRAVENOUS

## 2023-07-03 MED ORDER — BORTEZOMIB CHEMO SQ INJECTION 3.5 MG (2.5MG/ML)
1.0000 mg/m2 | Freq: Once | INTRAMUSCULAR | Status: DC
Start: 2023-07-03 — End: 2023-07-03

## 2023-07-03 MED ORDER — HEPARIN SOD (PORK) LOCK FLUSH 100 UNIT/ML IV SOLN
500.0000 [IU] | Freq: Once | INTRAVENOUS | Status: AC
  Administered 2023-07-03: 500 [IU] via INTRAVENOUS

## 2023-07-03 MED ORDER — SODIUM CHLORIDE 0.9% FLUSH
10.0000 mL | Freq: Once | INTRAVENOUS | Status: AC
  Administered 2023-07-03: 10 mL via INTRAVENOUS

## 2023-07-03 MED ORDER — BORTEZOMIB CHEMO SQ INJECTION 3.5 MG (2.5MG/ML)
1.0000 mg/m2 | Freq: Once | INTRAMUSCULAR | Status: AC
  Administered 2023-07-03: 2 mg via SUBCUTANEOUS
  Filled 2023-07-03: qty 0.8

## 2023-07-03 NOTE — Patient Instructions (Signed)
[facility] [facility] - [facility]  Discharge Instructions: Thank you for choosing  Cancer Center to provide your oncology and hematology care.  If you have a lab appointment with the Cancer Center - please note that after April 8th, 2024, all labs will be drawn in the cancer center.  You do not have to check in or register with the main entrance as you have in the past but will complete your check-in in the cancer center.  Wear comfortable clothing and clothing appropriate for easy access to any Portacath or PICC line.   We strive to give you quality time with your provider. You may need to reschedule your appointment if you arrive late (15 or more minutes).  Arriving late affects you and other patients whose appointments are after yours.  Also, if you miss three or more appointments without notifying the office, you may be dismissed from the clinic at the provider's discretion.      For prescription refill requests, have your pharmacy contact our office and allow 72 hours for refills to be completed.    Today you received the following chemotherapy and/or immunotherapy agents Velcade   To help prevent nausea and vomiting after your treatment, we encourage you to take your nausea medication as directed.  Bortezomib Injection What is this medication? BORTEZOMIB (bor TEZ oh mib) treats lymphoma. It may also be used to treat multiple myeloma, a type of bone marrow cancer. It works by blocking a protein that causes cancer cells to grow and multiply. This helps to slow or stop the spread of cancer cells. This medicine may be used for other purposes; ask your health care provider or pharmacist if you have questions. COMMON BRAND NAME(S): Velcade What should I tell my care team before I take this medication? They need to know if you have any of these conditions: Dehydration Diabetes Heart disease Liver disease Tingling of the fingers or toes or other  nerve disorder An unusual or allergic reaction to bortezomib, other medications, foods, dyes, or preservatives If you or your partner are pregnant or trying to get pregnant Breastfeeding How should I use this medication? This medication is injected into a vein or under the skin. It is given by your care team in a hospital or clinic setting. Talk to your care team about the use of this medication in children. Special care may be needed. Overdosage: If you think you have taken too much of this medicine contact a poison control center or emergency room at once. NOTE: This medicine is only for you. Do not share this medicine with others. What if I miss a dose? Keep appointments for follow-up doses. It is important not to miss your dose. Call your care team if you are unable to keep an appointment. What may interact with this medication? Ketoconazole Rifampin This list may not describe all possible interactions. Give your health care provider a list of all the medicines, herbs, non-prescription drugs, or dietary supplements you use. Also tell them if you smoke, drink alcohol, or use illegal drugs. Some items may interact with your medicine. What should I watch for while using this medication? Your condition will be monitored carefully while you are receiving this medication. You may need blood work while taking this medication. This medication may affect your coordination, reaction time, or judgment. Do not drive or operate machinery until you know how this medication affects you. Sit up or stand slowly to reduce the  risk of dizzy or fainting spells. Drinking alcohol with this medication can increase the risk of these side effects. This medication may increase your risk of getting an infection. Call your care team for advice if you get a fever, chills, sore throat, or other symptoms of a cold or flu. Do not treat yourself. Try to avoid being around people who are sick. Check with your care team if you  have severe diarrhea, nausea, and vomiting, or if you sweat a lot. The loss of too much body fluid may make it dangerous for you to take this medication. Talk to your care team if you may be pregnant. Serious birth defects can occur if you take this medication during pregnancy and for 7 months after the last dose. You will need a negative pregnancy test before starting this medication. Contraception is recommended while taking this medication and for 7 months after the last dose. Your care team can help you find the option that works for you. If your partner can get pregnant, use a condom during sex while taking this medication and for 4 months after the last dose. Do not breastfeed while taking this medication and for 2 months after the last dose. This medication may cause infertility. Talk to your care team if you are concerned about your fertility. What side effects may I notice from receiving this medication? Side effects that you should report to your care team as soon as possible: Allergic reactions--skin rash, itching, hives, swelling of the face, lips, tongue, or throat Bleeding--bloody or black, tar-like stools, vomiting blood or brown material that looks like coffee grounds, red or dark brown urine, small red or purple spots on skin, unusual bruising or bleeding Bleeding in the brain--severe headache, stiff neck, confusion, dizziness, change in vision, numbness or weakness of the face, arm, or leg, trouble speaking, trouble walking, vomiting Bowel blockage--stomach cramping, unable to have a bowel movement or pass gas, loss of appetite, vomiting Heart failure--shortness of breath, swelling of the ankles, feet, or hands, sudden weight gain, unusual weakness or fatigue Infection--fever, chills, cough, sore throat, wounds that don't heal, pain or trouble when passing urine, general feeling of discomfort or being unwell Liver injury--right upper belly pain, loss of appetite, nausea, light-colored  stool, dark yellow or brown urine, yellowing skin or eyes, unusual weakness or fatigue Low blood pressure--dizziness, feeling faint or lightheaded, blurry vision Lung injury--shortness of breath or trouble breathing, cough, spitting up blood, chest pain, fever Pain, tingling, or numbness in the hands or feet Severe or prolonged diarrhea Stomach pain, bloody diarrhea, pale skin, unusual weakness or fatigue, decrease in the amount of urine, which may be signs of hemolytic uremic syndrome Sudden and severe headache, confusion, change in vision, seizures, which may be signs of posterior reversible encephalopathy syndrome (PRES) TTP--purple spots on the skin or inside the mouth, pale skin, yellowing skin or eyes, unusual weakness or fatigue, fever, fast or irregular heartbeat, confusion, change in vision, trouble speaking, trouble walking Tumor lysis syndrome (TLS)--nausea, vomiting, diarrhea, decrease in the amount of urine, dark urine, unusual weakness or fatigue, confusion, muscle pain or cramps, fast or irregular heartbeat, joint pain Side effects that usually do not require medical attention (report to your care team if they continue or are bothersome): Constipation Diarrhea Fatigue Loss of appetite Nausea This list may not describe all possible side effects. Call your doctor for medical advice about side effects. You may report side effects to FDA at 1-800-FDA-1088. Where should I keep my medication?  This medication is given in a hospital or clinic. It will not be stored at home. NOTE: This sheet is a summary. It may not cover all possible information. If you have questions about this medicine, talk to your doctor, pharmacist, or health care provider.  2024 Elsevier/Gold Standard (2022-01-09 00:00:00)   BELOW ARE SYMPTOMS THAT SHOULD BE REPORTED IMMEDIATELY: *FEVER GREATER THAN 100.4 F (38 C) OR HIGHER *CHILLS OR SWEATING *NAUSEA AND VOMITING THAT IS NOT CONTROLLED WITH YOUR NAUSEA  MEDICATION *UNUSUAL SHORTNESS OF BREATH *UNUSUAL BRUISING OR BLEEDING *URINARY PROBLEMS (pain or burning when urinating, or frequent urination) *BOWEL PROBLEMS (unusual diarrhea, constipation, pain near the anus) TENDERNESS IN MOUTH AND THROAT WITH OR WITHOUT PRESENCE OF ULCERS (sore throat, sores in mouth, or a toothache) UNUSUAL RASH, SWELLING OR PAIN  UNUSUAL VAGINAL DISCHARGE OR ITCHING   Items with * indicate a potential emergency and should be followed up as soon as possible or go to the Emergency Department if any problems should occur.  Please show the CHEMOTHERAPY ALERT CARD or IMMUNOTHERAPY ALERT CARD at check-in to the Emergency Department and triage nurse.  Should you have questions after your visit or need to cancel or reschedule your appointment, please contact [place] [facility] - A DEPT OF [facility] [phone]  and follow the prompts.  Office hours are 8:00 a.m. to 4:30 p.m. Monday - Friday. Please note that voicemails left after 4:00 p.m. may not be returned until the following business day.  We are closed weekends and major holidays. You have access to a nurse at all times for urgent questions. Please call the main number to the clinic [phone] and follow the prompts.  For any non-urgent questions, you may also contact your provider using MyChart. We now offer e-Visits for anyone 16 and older to request care online for non-urgent symptoms. For details visit [email or web address].   Also download the MyChart app! Go to the app store, search "MyChart", open the app, select [place], and log in with your MyChart username and password.

## 2023-07-03 NOTE — Progress Notes (Signed)
Patient presents today for Velcade infusion. Patient is in satisfactory condition with no new complaints voiced.  Vital signs are stable.  Labs reviewed and all labs are within treatment parameters.  We will proceed with treatment per MD orders.    Treatment given today per MD orders. Tolerated infusion without adverse affects. Vital signs stable. No complaints at this time. Discharged from clinic ambulatory in stable condition. Alert and oriented x 3. F/U with [facility] as scheduled.

## 2023-07-09 ENCOUNTER — Inpatient Hospital Stay: Payer: 59

## 2023-07-09 ENCOUNTER — Ambulatory Visit: Payer: 59 | Admitting: Hematology

## 2023-07-15 ENCOUNTER — Other Ambulatory Visit: Payer: Self-pay | Admitting: *Deleted

## 2023-07-15 DIAGNOSIS — C9000 Multiple myeloma not having achieved remission: Secondary | ICD-10-CM

## 2023-07-15 MED ORDER — HYDROCODONE-ACETAMINOPHEN 10-325 MG PO TABS: 1.0000 | ORAL_TABLET | Freq: Four times a day (QID) | ORAL | 0 refills | Status: DC | PRN

## 2023-07-16 ENCOUNTER — Other Ambulatory Visit: Payer: Self-pay

## 2023-07-16 ENCOUNTER — Other Ambulatory Visit: Payer: Self-pay | Admitting: Hematology

## 2023-07-17 ENCOUNTER — Inpatient Hospital Stay: Payer: 59

## 2023-07-17 VITALS — BP 151/83 | HR 73 | Temp 97.6°F | Resp 20 | Wt 195.2 lb

## 2023-07-17 DIAGNOSIS — C9000 Multiple myeloma not having achieved remission: Secondary | ICD-10-CM

## 2023-07-17 DIAGNOSIS — Z95828 Presence of other vascular implants and grafts: Secondary | ICD-10-CM

## 2023-07-17 DIAGNOSIS — Z5112 Encounter for antineoplastic immunotherapy: Secondary | ICD-10-CM | POA: Diagnosis not present

## 2023-07-17 LAB — CBC WITH DIFFERENTIAL/PLATELET
Abs Immature Granulocytes: 0.01 10*3/uL (ref 0.00–0.07)
Basophils Absolute: 0 10*3/uL (ref 0.0–0.1)
Basophils Relative: 1 %
Eosinophils Absolute: 0.1 10*3/uL (ref 0.0–0.5)
Eosinophils Relative: 3 %
HCT: 31.3 % — ABNORMAL LOW (ref 36.0–46.0)
Hemoglobin: 10 g/dL — ABNORMAL LOW (ref 12.0–15.0)
Immature Granulocytes: 0 %
Lymphocytes Relative: 17 %
Lymphs Abs: 0.5 10*3/uL — ABNORMAL LOW (ref 0.7–4.0)
MCH: 31.9 pg (ref 26.0–34.0)
MCHC: 31.9 g/dL (ref 30.0–36.0)
MCV: 100 fL (ref 80.0–100.0)
Monocytes Absolute: 0.4 10*3/uL (ref 0.1–1.0)
Monocytes Relative: 14 %
Neutro Abs: 2 10*3/uL (ref 1.7–7.7)
Neutrophils Relative %: 65 %
Platelets: 131 10*3/uL — ABNORMAL LOW (ref 150–400)
RBC: 3.13 MIL/uL — ABNORMAL LOW (ref 3.87–5.11)
RDW: 12.9 % (ref 11.5–15.5)
WBC: 3.1 10*3/uL — ABNORMAL LOW (ref 4.0–10.5)
nRBC: 0 % (ref 0.0–0.2)

## 2023-07-17 LAB — COMPREHENSIVE METABOLIC PANEL
ALT: 28 U/L (ref 0–44)
AST: 25 U/L (ref 15–41)
Albumin: 4.1 g/dL (ref 3.5–5.0)
Alkaline Phosphatase: 81 U/L (ref 38–126)
Anion gap: 7 (ref 5–15)
BUN: 31 mg/dL — ABNORMAL HIGH (ref 8–23)
CO2: 28 mmol/L (ref 22–32)
Calcium: 8.9 mg/dL (ref 8.9–10.3)
Chloride: 103 mmol/L (ref 98–111)
Creatinine, Ser: 1.48 mg/dL — ABNORMAL HIGH (ref 0.44–1.00)
GFR, Estimated: 37 mL/min — ABNORMAL LOW (ref >60)
Glucose, Bld: 85 mg/dL (ref 70–99)
Potassium: 4.1 mmol/L (ref 3.5–5.1)
Sodium: 138 mmol/L (ref 135–145)
Total Bilirubin: 0.8 mg/dL (ref <1.2)
Total Protein: 6.6 g/dL (ref 6.5–8.1)

## 2023-07-17 LAB — MAGNESIUM: Magnesium: 2.1 mg/dL (ref 1.7–2.4)

## 2023-07-17 MED ORDER — BORTEZOMIB CHEMO SQ INJECTION 3.5 MG (2.5MG/ML)
1.0000 mg/m2 | Freq: Once | INTRAMUSCULAR | Status: AC
  Administered 2023-07-17: 2 mg via SUBCUTANEOUS
  Filled 2023-07-17: qty 0.8

## 2023-07-17 MED ORDER — SODIUM CHLORIDE 0.9% FLUSH
10.0000 mL | INTRAVENOUS | Status: DC | PRN
  Administered 2023-07-17: 10 mL via INTRAVENOUS

## 2023-07-17 MED ORDER — HEPARIN SOD (PORK) LOCK FLUSH 100 UNIT/ML IV SOLN
500.0000 [IU] | Freq: Once | INTRAVENOUS | Status: AC
  Administered 2023-07-17: 500 [IU] via INTRAVENOUS

## 2023-07-17 MED ORDER — DARATUMUMAB-HYALURONIDASE-FIHJ 1800-30000 MG-UT/15ML ~~LOC~~ SOLN
1800.0000 mg | Freq: Once | SUBCUTANEOUS | Status: AC
  Administered 2023-07-17: 1800 mg via SUBCUTANEOUS
  Filled 2023-07-17: qty 15

## 2023-07-17 NOTE — Patient Instructions (Signed)
[place] [facility]  Discharge Instructions: Thank you for choosing [facility] to provide your oncology and hematology care.  If you have a lab appointment with the Cancer Center - please note that after April 8th, 2024, all labs will be drawn in the cancer center.  You do not have to check in or register with the main entrance as you have in the past but will complete your check-in in the cancer center.  Wear comfortable clothing and clothing appropriate for easy access to any Portacath or PICC line.   We strive to give you quality time with your provider. You may need to reschedule your appointment if you arrive late (15 or more minutes).  Arriving late affects you and other patients whose appointments are after yours.  Also, if you miss three or more appointments without notifying the office, you may be dismissed from the clinic at the provider's discretion.      For prescription refill requests, have your pharmacy contact our office and allow 72 hours for refills to be completed.    Today you received the following chemotherapy and/or immunotherapy agents Daratumumab and Velcade, return as scheduled.   To help prevent nausea and vomiting after your treatment, we encourage you to take your nausea medication as directed.  BELOW ARE SYMPTOMS THAT SHOULD BE REPORTED IMMEDIATELY: *FEVER GREATER THAN 100.4 F (38 C) OR HIGHER *CHILLS OR SWEATING *NAUSEA AND VOMITING THAT IS NOT CONTROLLED WITH YOUR NAUSEA MEDICATION *UNUSUAL SHORTNESS OF BREATH *UNUSUAL BRUISING OR BLEEDING *URINARY PROBLEMS (pain or burning when urinating, or frequent urination) *BOWEL PROBLEMS (unusual diarrhea, constipation, pain near the anus) TENDERNESS IN MOUTH AND THROAT WITH OR WITHOUT PRESENCE OF ULCERS (sore throat, sores in mouth, or a toothache) UNUSUAL RASH, SWELLING OR PAIN  UNUSUAL VAGINAL DISCHARGE OR ITCHING   Items with * indicate a potential  emergency and should be followed up as soon as possible or go to the Emergency Department if any problems should occur.  Please show the CHEMOTHERAPY ALERT CARD or IMMUNOTHERAPY ALERT CARD at check-in to the Emergency Department and triage nurse.  Should you have questions after your visit or need to cancel or reschedule your appointment, please contact [place] [facility] - A DEPT OF [facility] [phone]  and follow the prompts.  Office hours are 8:00 a.m. to 4:30 p.m. Monday - Friday. Please note that voicemails left after 4:00 p.m. may not be returned until the following business day.  We are closed weekends and major holidays. You have access to a nurse at all times for urgent questions. Please call the main number to the clinic [phone] and follow the prompts.  For any non-urgent questions, you may also contact your provider using MyChart. We now offer e-Visits for anyone 11 and older to request care online for non-urgent symptoms. For details visit [email or web address].   Also download the MyChart app! Go to the app store, search "MyChart", open the app, select , and log in with your MyChart username and password.

## 2023-07-17 NOTE — Progress Notes (Signed)
Patient presents today for Velcade and Dara. Patient reports taking pre-meds at home. Patient reports she feels like confusion is getting worse, RN made patient aware of upcoming neurology visit in January. Dr. Anders Simmonds made aware of ongoing confusion, okay to continue with treatment today. Patient tolerated Daratumumab injection with no complaints voiced. See MAR for details. Lab reviewed. Injection site clean and dry with no bruising or swelling noted at site. Band aid applied. Patient tolerated Velcade injection with no complaints voiced. Lab work reviewed. See MAR for details. Injection site clean and dry with no bruising or swelling noted. Patient stable during and after injection. Band aid applied. VSS. Patient left in satisfactory condition with no s/s of distress noted.

## 2023-07-23 ENCOUNTER — Ambulatory Visit: Payer: 59

## 2023-07-23 ENCOUNTER — Inpatient Hospital Stay: Payer: 59

## 2023-07-26 ENCOUNTER — Other Ambulatory Visit: Payer: Self-pay | Admitting: Hematology

## 2023-07-29 ENCOUNTER — Encounter: Payer: Self-pay | Admitting: Hematology

## 2023-07-31 ENCOUNTER — Inpatient Hospital Stay: Payer: 59 | Attending: Hematology

## 2023-07-31 ENCOUNTER — Inpatient Hospital Stay: Payer: 59

## 2023-07-31 ENCOUNTER — Other Ambulatory Visit: Payer: Self-pay | Admitting: Hematology

## 2023-07-31 DIAGNOSIS — C9000 Multiple myeloma not having achieved remission: Secondary | ICD-10-CM | POA: Diagnosis present

## 2023-07-31 DIAGNOSIS — Z7901 Long term (current) use of anticoagulants: Secondary | ICD-10-CM | POA: Insufficient documentation

## 2023-07-31 DIAGNOSIS — C9002 Multiple myeloma in relapse: Secondary | ICD-10-CM | POA: Diagnosis not present

## 2023-07-31 DIAGNOSIS — G629 Polyneuropathy, unspecified: Secondary | ICD-10-CM | POA: Insufficient documentation

## 2023-07-31 DIAGNOSIS — Z7952 Long term (current) use of systemic steroids: Secondary | ICD-10-CM | POA: Diagnosis not present

## 2023-07-31 DIAGNOSIS — Z86711 Personal history of pulmonary embolism: Secondary | ICD-10-CM | POA: Insufficient documentation

## 2023-07-31 DIAGNOSIS — Z5112 Encounter for antineoplastic immunotherapy: Secondary | ICD-10-CM | POA: Insufficient documentation

## 2023-07-31 DIAGNOSIS — D72819 Decreased white blood cell count, unspecified: Secondary | ICD-10-CM | POA: Diagnosis not present

## 2023-07-31 DIAGNOSIS — D696 Thrombocytopenia, unspecified: Secondary | ICD-10-CM | POA: Insufficient documentation

## 2023-07-31 LAB — COMPREHENSIVE METABOLIC PANEL
ALT: 34 U/L (ref 0–44)
AST: 26 U/L (ref 15–41)
Albumin: 3.9 g/dL (ref 3.5–5.0)
Alkaline Phosphatase: 86 U/L (ref 38–126)
Anion gap: 9 (ref 5–15)
BUN: 22 mg/dL (ref 8–23)
CO2: 26 mmol/L (ref 22–32)
Calcium: 9.3 mg/dL (ref 8.9–10.3)
Chloride: 102 mmol/L (ref 98–111)
Creatinine, Ser: 1.42 mg/dL — ABNORMAL HIGH (ref 0.44–1.00)
GFR, Estimated: 39 mL/min — ABNORMAL LOW (ref >60)
Glucose, Bld: 141 mg/dL — ABNORMAL HIGH (ref 70–99)
Potassium: 4.2 mmol/L (ref 3.5–5.1)
Sodium: 137 mmol/L (ref 135–145)
Total Bilirubin: 0.4 mg/dL (ref <1.2)
Total Protein: 6.4 g/dL — ABNORMAL LOW (ref 6.5–8.1)

## 2023-07-31 LAB — CBC WITH DIFFERENTIAL/PLATELET
Abs Immature Granulocytes: 0 10*3/uL (ref 0.00–0.07)
Basophils Absolute: 0 10*3/uL (ref 0.0–0.1)
Basophils Relative: 1 %
Eosinophils Absolute: 0.1 10*3/uL (ref 0.0–0.5)
Eosinophils Relative: 3 %
HCT: 30.9 % — ABNORMAL LOW (ref 36.0–46.0)
Hemoglobin: 9.8 g/dL — ABNORMAL LOW (ref 12.0–15.0)
Immature Granulocytes: 0 %
Lymphocytes Relative: 13 %
Lymphs Abs: 0.5 10*3/uL — ABNORMAL LOW (ref 0.7–4.0)
MCH: 31.8 pg (ref 26.0–34.0)
MCHC: 31.7 g/dL (ref 30.0–36.0)
MCV: 100.3 fL — ABNORMAL HIGH (ref 80.0–100.0)
Monocytes Absolute: 0.4 10*3/uL (ref 0.1–1.0)
Monocytes Relative: 9 %
Neutro Abs: 3.1 10*3/uL (ref 1.7–7.7)
Neutrophils Relative %: 74 %
Platelets: 128 10*3/uL — ABNORMAL LOW (ref 150–400)
RBC: 3.08 MIL/uL — ABNORMAL LOW (ref 3.87–5.11)
RDW: 12.9 % (ref 11.5–15.5)
WBC: 4.1 10*3/uL (ref 4.0–10.5)
nRBC: 0 % (ref 0.0–0.2)

## 2023-07-31 LAB — MAGNESIUM: Magnesium: 2 mg/dL (ref 1.7–2.4)

## 2023-07-31 MED ORDER — BORTEZOMIB CHEMO SQ INJECTION 3.5 MG (2.5MG/ML)
1.0000 mg/m2 | Freq: Once | INTRAMUSCULAR | Status: AC
  Administered 2023-07-31: 2 mg via SUBCUTANEOUS
  Filled 2023-07-31: qty 0.8

## 2023-07-31 MED ORDER — SODIUM CHLORIDE 0.9% FLUSH
10.0000 mL | INTRAVENOUS | Status: DC | PRN
  Administered 2023-07-31: 10 mL via INTRAVENOUS

## 2023-07-31 MED ORDER — HEPARIN SOD (PORK) LOCK FLUSH 100 UNIT/ML IV SOLN
500.0000 [IU] | Freq: Once | INTRAVENOUS | Status: AC
  Administered 2023-07-31: 500 [IU] via INTRAVENOUS

## 2023-07-31 NOTE — Patient Instructions (Signed)
[facility] [place] [facility]  Discharge Instructions: Thank you for choosing [facility] to provide your oncology and hematology care.  If you have a lab appointment with the Cancer Center - please note that after April 8th, 2024, all labs will be drawn in the cancer center.  You do not have to check in or register with the main entrance as you have in the past but will complete your check-in in the cancer center.  Wear comfortable clothing and clothing appropriate for easy access to any Portacath or PICC line.   We strive to give you quality time with your provider. You may need to reschedule your appointment if you arrive late (15 or more minutes).  Arriving late affects you and other patients whose appointments are after yours.  Also, if you miss three or more appointments without notifying the office, you may be dismissed from the clinic at the provider's discretion.      For prescription refill requests, have your pharmacy contact our office and allow 72 hours for refills to be completed.    Today you received the following chemotherapy and/or immunotherapy agents Velcade. Bortezomib Injection What is this medication? BORTEZOMIB (bor TEZ oh mib) treats lymphoma. It may also be used to treat multiple myeloma, a type of bone marrow cancer. It works by blocking a protein that causes cancer cells to grow and multiply. This helps to slow or stop the spread of cancer cells. This medicine may be used for other purposes; ask your health care provider or pharmacist if you have questions. COMMON BRAND NAME(S): Velcade What should I tell my care team before I take this medication? They need to know if you have any of these conditions: Dehydration Diabetes Heart disease Liver disease Tingling of the fingers or toes or other nerve disorder An unusual or allergic reaction to bortezomib, other medications, foods, dyes, or preservatives If you or  your partner are pregnant or trying to get pregnant Breastfeeding How should I use this medication? This medication is injected into a vein or under the skin. It is given by your care team in a hospital or clinic setting. Talk to your care team about the use of this medication in children. Special care may be needed. Overdosage: If you think you have taken too much of this medicine contact a poison control center or emergency room at once. NOTE: This medicine is only for you. Do not share this medicine with others. What if I miss a dose? Keep appointments for follow-up doses. It is important not to miss your dose. Call your care team if you are unable to keep an appointment. What may interact with this medication? Ketoconazole Rifampin This list may not describe all possible interactions. Give your health care provider a list of all the medicines, herbs, non-prescription drugs, or dietary supplements you use. Also tell them if you smoke, drink alcohol, or use illegal drugs. Some items may interact with your medicine. What should I watch for while using this medication? Your condition will be monitored carefully while you are receiving this medication. You may need blood work while taking this medication. This medication may affect your coordination, reaction time, or judgment. Do not drive or operate machinery until you know how this medication affects you. Sit up or stand slowly to reduce the risk of dizzy or fainting spells. Drinking alcohol with this medication can increase the risk of these side effects. This medication  may increase your risk of getting an infection. Call your care team for advice if you get a fever, chills, sore throat, or other symptoms of a cold or flu. Do not treat yourself. Try to avoid being around people who are sick. Check with your care team if you have severe diarrhea, nausea, and vomiting, or if you sweat a lot. The loss of too much body fluid may make it dangerous  for you to take this medication. Talk to your care team if you may be pregnant. Serious birth defects can occur if you take this medication during pregnancy and for 7 months after the last dose. You will need a negative pregnancy test before starting this medication. Contraception is recommended while taking this medication and for 7 months after the last dose. Your care team can help you find the option that works for you. If your partner can get pregnant, use a condom during sex while taking this medication and for 4 months after the last dose. Do not breastfeed while taking this medication and for 2 months after the last dose. This medication may cause infertility. Talk to your care team if you are concerned about your fertility. What side effects may I notice from receiving this medication? Side effects that you should report to your care team as soon as possible: Allergic reactions--skin rash, itching, hives, swelling of the face, lips, tongue, or throat Bleeding--bloody or black, tar-like stools, vomiting blood or  material that looks like coffee grounds, red or dark  urine, small red or purple spots on skin, unusual bruising or bleeding Bleeding in the brain--severe headache, stiff neck, confusion, dizziness, change in vision, numbness or weakness of the face, arm, or leg, trouble speaking, trouble walking, vomiting Bowel blockage--stomach cramping, unable to have a bowel movement or pass gas, loss of appetite, vomiting Heart failure--shortness of breath, swelling of the ankles, feet, or hands, sudden weight gain, unusual weakness or fatigue Infection--fever, chills, cough, sore throat, wounds that don't heal, pain or trouble when passing urine, general feeling of discomfort or being unwell Liver injury--right upper belly pain, loss of appetite, nausea, light-colored stool, dark yellow or  urine, yellowing skin or eyes, unusual weakness or fatigue Low blood pressure--dizziness,  feeling faint or lightheaded, blurry vision Lung injury--shortness of breath or trouble breathing, cough, spitting up blood, chest pain, fever Pain, tingling, or numbness in the hands or feet Severe or prolonged diarrhea Stomach pain, bloody diarrhea, pale skin, unusual weakness or fatigue, decrease in the amount of urine, which may be signs of hemolytic uremic syndrome Sudden and severe headache, confusion, change in vision, seizures, which may be signs of posterior reversible encephalopathy syndrome (PRES) TTP--purple spots on the skin or inside the mouth, pale skin, yellowing skin or eyes, unusual weakness or fatigue, fever, fast or irregular heartbeat, confusion, change in vision, trouble speaking, trouble walking Tumor lysis syndrome (TLS)--nausea, vomiting, diarrhea, decrease in the amount of urine, dark urine, unusual weakness or fatigue, confusion, muscle pain or cramps, fast or irregular heartbeat, joint pain Side effects that usually do not require medical attention (report to your care team if they continue or are bothersome): Constipation Diarrhea Fatigue Loss of appetite Nausea This list may not describe all possible side effects. Call your doctor for medical advice about side effects. You may report side effects to FDA at 1-800-FDA-1088. Where should I keep my medication? This medication is given in a hospital or clinic. It will not be stored at home. NOTE: This sheet is a  summary. It may not cover all possible information. If you have questions about this medicine, talk to your doctor, pharmacist, or health care provider.  2024 Elsevier/Gold Standard (2022-01-09 00:00:00)       To help prevent nausea and vomiting after your treatment, we encourage you to take your nausea medication as directed.  BELOW ARE SYMPTOMS THAT SHOULD BE REPORTED IMMEDIATELY: *FEVER GREATER THAN 100.4 F (38 C) OR HIGHER *CHILLS OR SWEATING *NAUSEA AND VOMITING THAT IS NOT CONTROLLED WITH YOUR NAUSEA  MEDICATION *UNUSUAL SHORTNESS OF BREATH *UNUSUAL BRUISING OR BLEEDING *URINARY PROBLEMS (pain or burning when urinating, or frequent urination) *BOWEL PROBLEMS (unusual diarrhea, constipation, pain near the anus) TENDERNESS IN MOUTH AND THROAT WITH OR WITHOUT PRESENCE OF ULCERS (sore throat, sores in mouth, or a toothache) UNUSUAL RASH, SWELLING OR PAIN  UNUSUAL VAGINAL DISCHARGE OR ITCHING   Items with * indicate a potential emergency and should be followed up as soon as possible or go to the Emergency Department if any problems should occur.  Please show the CHEMOTHERAPY ALERT CARD or IMMUNOTHERAPY ALERT CARD at check-in to the Emergency Department and triage nurse.  Should you have questions after your visit or need to cancel or reschedule your appointment, please contact [facility] [place] - A DEPT OF [facility]ide[facility] [phone]  and follow the prompts.  Office hours are 8:00 a.m. to 4:30 p.m. Monday - Friday. Please note that voicemails left after 4:00 p.m. may not be returned until the following business day.  We are closed weekends and major holidays. You have access to a nurse at all times for urgent questions. Please call the main number to the clinic [phone] and follow the prompts.  For any non-urgent questions, you may also contact your provider using MyChart. We now offer e-Visits for anyone 33 and older to request care online for non-urgent symptoms. For details visit [email or web address].   Also download the MyChart app! Go to the app store, search "MyChart", open the app, select [place], and log in with your MyChart username and password.

## 2023-07-31 NOTE — Progress Notes (Signed)
Patient presents today for Velcade injection.  Patient is in satisfactory condition with no new complaints voiced.  Vital signs are stable.  Labs reviewed and all labs are within treatment parameters.  We will proceed with treatment per MD orders.    Patient tolerated injection with no complaints voiced.  Site clean and dry with no bruising or swelling noted.  No complaints of pain.  Discharged with vital signs stable and no signs or symptoms of distress noted.

## 2023-08-01 LAB — KAPPA/LAMBDA LIGHT CHAINS
Kappa free light chain: 6.4 mg/L (ref 3.3–19.4)
Kappa, lambda light chain ratio: 0.59 (ref 0.26–1.65)
Lambda free light chains: 10.8 mg/L (ref 5.7–26.3)

## 2023-08-02 LAB — PROTEIN ELECTROPHORESIS, SERUM
A/G Ratio: 1.8 — ABNORMAL HIGH (ref 0.7–1.7)
Albumin ELP: 3.8 g/dL (ref 2.9–4.4)
Alpha-1-Globulin: 0.2 g/dL (ref 0.0–0.4)
Alpha-2-Globulin: 0.9 g/dL (ref 0.4–1.0)
Beta Globulin: 0.7 g/dL (ref 0.7–1.3)
Gamma Globulin: 0.3 g/dL — ABNORMAL LOW (ref 0.4–1.8)
Globulin, Total: 2.1 g/dL — ABNORMAL LOW (ref 2.2–3.9)
Total Protein ELP: 5.9 g/dL — ABNORMAL LOW (ref 6.0–8.5)

## 2023-08-06 ENCOUNTER — Inpatient Hospital Stay: Payer: 59

## 2023-08-06 ENCOUNTER — Inpatient Hospital Stay: Payer: 59 | Admitting: Hematology

## 2023-08-11 ENCOUNTER — Other Ambulatory Visit: Payer: Self-pay | Admitting: Hematology

## 2023-08-12 ENCOUNTER — Encounter: Payer: Self-pay | Admitting: Hematology

## 2023-08-12 ENCOUNTER — Other Ambulatory Visit: Payer: Self-pay | Admitting: *Deleted

## 2023-08-12 DIAGNOSIS — C9000 Multiple myeloma not having achieved remission: Secondary | ICD-10-CM

## 2023-08-12 MED ORDER — HYDROCODONE-ACETAMINOPHEN 10-325 MG PO TABS: 1.0000 | ORAL_TABLET | Freq: Four times a day (QID) | ORAL | 0 refills | Status: DC | PRN

## 2023-08-13 ENCOUNTER — Inpatient Hospital Stay: Payer: 59

## 2023-08-13 ENCOUNTER — Inpatient Hospital Stay (HOSPITAL_BASED_OUTPATIENT_CLINIC_OR_DEPARTMENT_OTHER): Payer: 59 | Admitting: Hematology

## 2023-08-13 VITALS — Wt 195.8 lb

## 2023-08-13 VITALS — BP 161/79 | HR 82 | Temp 97.6°F | Resp 18

## 2023-08-13 DIAGNOSIS — C9000 Multiple myeloma not having achieved remission: Secondary | ICD-10-CM

## 2023-08-13 DIAGNOSIS — Z95828 Presence of other vascular implants and grafts: Secondary | ICD-10-CM

## 2023-08-13 DIAGNOSIS — Z5112 Encounter for antineoplastic immunotherapy: Secondary | ICD-10-CM | POA: Diagnosis not present

## 2023-08-13 LAB — COMPREHENSIVE METABOLIC PANEL
ALT: 31 U/L (ref 0–44)
AST: 25 U/L (ref 15–41)
Albumin: 4 g/dL (ref 3.5–5.0)
Alkaline Phosphatase: 87 U/L (ref 38–126)
Anion gap: 6 (ref 5–15)
BUN: 25 mg/dL — ABNORMAL HIGH (ref 8–23)
CO2: 25 mmol/L (ref 22–32)
Calcium: 8.6 mg/dL — ABNORMAL LOW (ref 8.9–10.3)
Chloride: 106 mmol/L (ref 98–111)
Creatinine, Ser: 1.57 mg/dL — ABNORMAL HIGH (ref 0.44–1.00)
GFR, Estimated: 34 mL/min — ABNORMAL LOW (ref >60)
Glucose, Bld: 195 mg/dL — ABNORMAL HIGH (ref 70–99)
Potassium: 4.2 mmol/L (ref 3.5–5.1)
Sodium: 137 mmol/L (ref 135–145)
Total Bilirubin: 0.6 mg/dL (ref <1.2)
Total Protein: 6.3 g/dL — ABNORMAL LOW (ref 6.5–8.1)

## 2023-08-13 LAB — CBC WITH DIFFERENTIAL/PLATELET
Abs Immature Granulocytes: 0 10*3/uL (ref 0.00–0.07)
Basophils Absolute: 0 10*3/uL (ref 0.0–0.1)
Basophils Relative: 1 %
Eosinophils Absolute: 0.2 10*3/uL (ref 0.0–0.5)
Eosinophils Relative: 5 %
HCT: 32 % — ABNORMAL LOW (ref 36.0–46.0)
Hemoglobin: 10.1 g/dL — ABNORMAL LOW (ref 12.0–15.0)
Immature Granulocytes: 0 %
Lymphocytes Relative: 11 %
Lymphs Abs: 0.4 10*3/uL — ABNORMAL LOW (ref 0.7–4.0)
MCH: 32.1 pg (ref 26.0–34.0)
MCHC: 31.6 g/dL (ref 30.0–36.0)
MCV: 101.6 fL — ABNORMAL HIGH (ref 80.0–100.0)
Monocytes Absolute: 0.3 10*3/uL (ref 0.1–1.0)
Monocytes Relative: 9 %
Neutro Abs: 2.8 10*3/uL (ref 1.7–7.7)
Neutrophils Relative %: 74 %
Platelets: 137 10*3/uL — ABNORMAL LOW (ref 150–400)
RBC: 3.15 MIL/uL — ABNORMAL LOW (ref 3.87–5.11)
RDW: 13 % (ref 11.5–15.5)
WBC: 3.7 10*3/uL — ABNORMAL LOW (ref 4.0–10.5)
nRBC: 0 % (ref 0.0–0.2)

## 2023-08-13 LAB — MAGNESIUM: Magnesium: 2.2 mg/dL (ref 1.7–2.4)

## 2023-08-13 MED ORDER — DEXAMETHASONE 4 MG PO TABS
20.0000 mg | ORAL_TABLET | Freq: Once | ORAL | Status: DC
Start: 2023-08-13 — End: 2023-08-13

## 2023-08-13 MED ORDER — HEPARIN SOD (PORK) LOCK FLUSH 100 UNIT/ML IV SOLN
500.0000 [IU] | Freq: Once | INTRAVENOUS | Status: AC
  Administered 2023-08-13: 500 [IU] via INTRAVENOUS

## 2023-08-13 MED ORDER — BORTEZOMIB CHEMO SQ INJECTION 3.5 MG (2.5MG/ML)
1.0000 mg/m2 | Freq: Once | INTRAMUSCULAR | Status: AC
  Administered 2023-08-13: 2 mg via SUBCUTANEOUS
  Filled 2023-08-13: qty 0.8

## 2023-08-13 MED ORDER — SODIUM CHLORIDE FLUSH 0.9 % IV SOLN
10.0000 mL | Freq: Once | INTRAVENOUS | Status: AC
  Administered 2023-08-13: 10 mL via INTRAVENOUS
  Filled 2023-08-13: qty 10

## 2023-08-13 MED ORDER — ACETAMINOPHEN 325 MG PO TABS
650.0000 mg | ORAL_TABLET | Freq: Once | ORAL | Status: DC
Start: 2023-08-13 — End: 2023-08-13

## 2023-08-13 MED ORDER — DARATUMUMAB-HYALURONIDASE-FIHJ 1800-30000 MG-UT/15ML ~~LOC~~ SOLN
1800.0000 mg | Freq: Once | SUBCUTANEOUS | Status: AC
  Administered 2023-08-13: 1800 mg via SUBCUTANEOUS
  Filled 2023-08-13: qty 15

## 2023-08-13 MED ORDER — DIPHENHYDRAMINE HCL 25 MG PO CAPS
50.0000 mg | ORAL_CAPSULE | Freq: Once | ORAL | Status: DC
Start: 2023-08-13 — End: 2023-08-13

## 2023-08-13 MED ORDER — FUROSEMIDE 20 MG PO TABS: 20.0000 mg | ORAL_TABLET | ORAL | 2 refills | Status: DC | PRN

## 2023-08-13 NOTE — Progress Notes (Signed)
Patient has been examined by Dr. Ellin Saba. Vital signs and labs have been reviewed by MD - ANC, Creatinine (1.57), LFTs, hemoglobin, and platelets are within treatment parameters per M.D. - pt may proceed with treatment.  Primary RN and pharmacy notified.

## 2023-08-13 NOTE — Patient Instructions (Signed)
[facility] [place] - A DEPT OF [facility]  Discharge Instructions: Thank you for choosing [facility] to provide your oncology and hematology care.  If you have a lab appointment with the Cancer Center - please note that after April 8th, 2024, all labs will be drawn in the cancer center.  You do not have to check in or register with the main entrance as you have in the past but will complete your check-in in the cancer center.  Wear comfortable clothing and clothing appropriate for easy access to any Portacath or PICC line.   We strive to give you quality time with your provider. You may need to reschedule your appointment if you arrive late (15 or more minutes).  Arriving late affects you and other patients whose appointments are after yours.  Also, if you miss three or more appointments without notifying the office, you may be dismissed from the clinic at the provider's discretion.      For prescription refill requests, have your pharmacy contact our office and allow 72 hours for refills to be completed.    Today you received the following chemotherapy and/or immunotherapy agents    To help prevent nausea and vomiting after your treatment, we encourage you to take your nausea medication as directed.  BELOW ARE SYMPTOMS THAT SHOULD BE REPORTED IMMEDIATELY: *FEVER GREATER THAN 100.4 F (38 C) OR HIGHER *CHILLS OR SWEATING *NAUSEA AND VOMITING THAT IS NOT CONTROLLED WITH YOUR NAUSEA MEDICATION *UNUSUAL SHORTNESS OF BREATH *UNUSUAL BRUISING OR BLEEDING *URINARY PROBLEMS (pain or burning when urinating, or frequent urination) *BOWEL PROBLEMS (unusual diarrhea, constipation, pain near the anus) TENDERNESS IN MOUTH AND THROAT WITH OR WITHOUT PRESENCE OF ULCERS (sore throat, sores in mouth, or a toothache) UNUSUAL RASH, SWELLING OR PAIN  UNUSUAL VAGINAL DISCHARGE OR ITCHING   Items with * indicate a potential emergency and should be followed up as soon as  possible or go to the Emergency Department if any problems should occur.  Please show the CHEMOTHERAPY ALERT CARD or IMMUNOTHERAPY ALERT CARD at check-in to the Emergency Department and triage nurse.  Should you have questions after your visit or need to cancel or reschedule your appointment, please contact [facility] [place] - A DEPT OF [facility] [phone]  and follow the prompts.  Office hours are 8:00 a.m. to 4:30 p.m. Monday - Friday. Please note that voicemails left after 4:00 p.m. may not be returned until the following business day.  We are closed weekends and major holidays. You have access to a nurse at all times for urgent questions. Please call the main number to the clinic [phone] and follow the prompts.  For any non-urgent questions, you may also contact your provider using MyChart. We now offer e-Visits for anyone 79 and older to request care online for non-urgent symptoms. For details visit [email or web address].   Also download the MyChart app! Go to the app store, search "MyChart", open the app, select [place], and log in with your MyChart username and password.

## 2023-08-13 NOTE — Patient Instructions (Signed)

## 2023-08-13 NOTE — Progress Notes (Signed)
[facility] [address] [place] [address]    Clinic Day:  08/13/2023  Referring physician: Alvina Filbert, MD  Patient Care Team: Alvina Filbert, MD as PCP - General (Internal Medicine) Doreatha Massed, MD as Medical Oncologist (Medical Oncology) Lanelle Bal, DO as Consulting Physician (Gastroenterology)   ASSESSMENT & PLAN:   Assessment: 1.  IgG lambda plasma cell myeloma, stage II, standard risk: -4 cycles of KPD from 04/17/2018 through 08/21/2018, stem cell transplant on 10/23/2018. -PET scan on 01/21/2019 showed multiple bone lesions but without any hypermetabolic activity. -BMBX on 01/21/2019 with normocellular marrow with no increase in plasma cells.  Normal FISH.  MRD results negative. -Maintenance pomalidomide 2 mg 3 weeks on/1 week off started on 03/10/2019. -Bone marrow biopsy on 10/29/2019 shows trilineage hematopoiesis with no evidence of plasma cells.  Chromosome analysis and FISH are normal. -BM BX on 10/27/2020 at [facility]-normocellular marrow with 30 to 40%, TLH.  2% of the total cells are CD 138+ plasma cells. - Pomalyst was held due to dizziness from 03/29/2021 through 04/26/2021. - MRI of the brain on 03/31/2021 did not show any evidence of intracranial lesions.  Multiple bone lesions in the skull compatible with sequela of myeloma.  - Labs at [facility] on 11/16/2021: M spike 0.24 g.  Immunofixation positive for IgG lambda. - PET scan on 12/14/2021: No evidence of FDG avid osseous or soft tissue myeloma. - She has developed left shoulder blade and right posterior rib pain for the last 3 to 4 months. - 24-hour urine on 12/14/2021: Total protein 136 mg.  Immune immunofixation positive for lambda type Bence-Jones protein. -- PET scan (05/24/2022): Single new hypermetabolic lytic lesion involving left lamina of T11.  No canal encroachment.  No other hypermetabolic bone lesions.  Stable diffuse lytic lesions throughout the axial and appendicular skeleton.   No soft tissue lesions. - Maintenance Pomalyst discontinued on 05/29/2022 - XRT to the T11 completed on 06/20/2022 - Daratumumab, Velcade and dexamethasone started on 06/11/2022   2.  Pulmonary embolism: -CT angiogram on 04/21/2020 showed filling defect at [facility]. -VQ scan confirmed pulmonary embolism. -She is on Eliquis.   3.  Right shoulder and upper arm pain: - She received XRT to the chest from 12/26/2020 through 01/06/2021.   4.  Myeloma bone disease: - Denosumab held due to exposure of bone in the right lower jaw.  Last dose in 2020.    Plan: 1.  IgG lambda plasma cell myeloma: - She is tolerating Velcade and monthly Darzalex reasonably well. - Denies any infections in the last 3 months. - Reviewed myeloma labs from 09/30/2022: M spike not detected.  FLC ratio is normal at 0.59. - Labs today: Creatinine stable at 1.57 calcium 8.6.  CBC shows mild leukopenia and thrombocytopenia likely from treatment. - Continue Velcade every 2 weeks and Darzalex monthly.  She will take dexamethasone 20 mg on days of Darzalex. - RTC 3 months for follow-up with repeat myeloma labs.   2.  Back pain/left shoulder blade pain/right lateral and posterior rib pains: - Continue hydrocodone 10/325 every 6-8 hours as needed.   3.  Hypomagnesemia: - Decrease magnesium to once a day.   4.  Anxiety: - Continue Xanax 0.5 mg twice daily.   5.  Sleeping difficulty: - She will continue Ambien daily as needed.   6.  Peripheral neuropathy: - Reports feet feel cold/numb all the time.  Fingers occasionally ache.  Mild tingling in the fingers is  also stable.  Continue gabapentin 300 mg twice daily.   7.  Pulmonary embolism: - Continue Eliquis twice daily.  No bleeding issues reported.    Orders Placed This Encounter  Procedures   Comprehensive metabolic panel    Standing Status:   Future    Expected Date:   09/11/2023    Expiration Date:   09/10/2024   CBC with Differential    Standing  Status:   Future    Expected Date:   09/11/2023    Expiration Date:   09/10/2024   CBC with Differential    Standing Status:   Future    Expected Date:   09/25/2023    Expiration Date:   09/24/2024   Comprehensive metabolic panel    Standing Status:   Future    Expected Date:   09/25/2023    Expiration Date:   09/24/2024   Comprehensive metabolic panel    Standing Status:   Future    Expected Date:   10/09/2023    Expiration Date:   10/08/2024   CBC with Differential    Standing Status:   Future    Expected Date:   10/09/2023    Expiration Date:   10/08/2024   CBC with Differential    Standing Status:   Future    Expected Date:   10/23/2023    Expiration Date:   10/22/2024   Comprehensive metabolic panel    Standing Status:   Future    Expected Date:   10/23/2023    Expiration Date:   10/22/2024   Comprehensive metabolic panel    Standing Status:   Future    Expected Date:   11/06/2023    Expiration Date:   11/05/2024   CBC with Differential    Standing Status:   Future    Expected Date:   11/06/2023    Expiration Date:   11/05/2024   CBC with Differential    Standing Status:   Future    Expected Date:   11/20/2023    Expiration Date:   11/19/2024   Comprehensive metabolic panel    Standing Status:   Future    Expected Date:   11/20/2023    Expiration Date:   11/19/2024   Comprehensive metabolic panel    Standing Status:   Future    Expected Date:   12/04/2023    Expiration Date:   12/03/2024   CBC with Differential    Standing Status:   Future    Expected Date:   12/04/2023    Expiration Date:   12/03/2024   CBC with Differential    Standing Status:   Future    Expected Date:   12/18/2023    Expiration Date:   12/17/2024   Comprehensive metabolic panel    Standing Status:   Future    Expected Date:   12/18/2023    Expiration Date:   12/17/2024    Is  I,Helena R Teague,acting as a Neurosurgeon for Doreatha Massed, MD.,have documented all relevant documentation on the behalf of Doreatha Massed,  MD,as directed by  Doreatha Massed, MD while in the presence of Doreatha Massed, MD.  I, Doreatha Massed MD, have reviewed the above documentation for accuracy and completeness, and I agree with the above.    Doreatha Massed, MD   12/24/202411:31 AM  CHIEF COMPLAINT:   Diagnosis: multiple myeloma    Cancer Staging  No matching staging information was found for the patient.    Prior Therapy: 1. KPD  x 4 cycles from 04/17/2018 to 08/21/2018. 2. Stem cell transplant on 10/23/2018. 3. Radiation to left and right humerus 25 Gy in 10 fractions from 08/23/2020 to 08/25/2020. 4.  Maintenance Pomalyst until 06/08/2022  Current Therapy:  Darzalex, Velcade and dexamethasone started on 06/11/2022    HISTORY OF PRESENT ILLNESS:   Oncology History  Multiple myeloma not having achieved remission (HCC)  04/07/2018 Initial Diagnosis   Multiple myeloma not having achieved remission (HCC)   06/11/2022 -  Chemotherapy   Patient is on Treatment Plan : MYELOMA RELAPSED / REFRACTORY Daratumumab SQ + Bortezomib + Dexamethasone (DaraVd) q21d / Daratumumab SQ q28d      Multiple myeloma without remission (HCC)  04/15/2018 Initial Diagnosis   Multiple myeloma without remission (HCC)   04/17/2018 - 09/05/2018 Chemotherapy   The patient had dexamethasone (DECADRON) 4 MG tablet, 1 of 1 cycle, Start date: 04/15/2018, End date: 05/26/2018 palonosetron (ALOXI) injection 0.25 mg, 0.25 mg, Intravenous,  Once, 1 of 1 cycle Administration: 0.25 mg (04/17/2018), 0.25 mg (04/24/2018), 0.25 mg (05/01/2018) cyclophosphamide (CYTOXAN) 540 mg in sodium chloride 0.9 % 250 mL chemo infusion, 300 mg/m2 = 540 mg, Intravenous,  Once, 1 of 1 cycle Administration: 540 mg (04/17/2018), 540 mg (04/24/2018), 540 mg (05/01/2018) carfilzomib (KYPROLIS) 36 mg in dextrose 5 % 50 mL chemo infusion, 20 mg/m2 = 36 mg, Intravenous, Once, 5 of 5 cycles Administration: 36 mg (04/17/2018), 36 mg (04/18/2018), 60 mg (04/24/2018), 60 mg  (04/25/2018), 60 mg (05/01/2018), 60 mg (05/02/2018), 60 mg (05/15/2018), 60 mg (05/16/2018), 60 mg (06/12/2018), 60 mg (06/13/2018), 60 mg (06/26/2018), 60 mg (06/27/2018), 60 mg (07/03/2018), 60 mg (07/04/2018), 60 mg (07/24/2018), 60 mg (07/25/2018), 60 mg (07/31/2018), 60 mg (08/01/2018), 60 mg (08/08/2018), 60 mg (08/07/2018), 60 mg (08/21/2018), 60 mg (08/22/2018), 60 mg (08/28/2018), 60 mg (08/29/2018), 60 mg (09/04/2018), 60 mg (09/05/2018)  for chemotherapy treatment.    06/11/2022 -  Chemotherapy   Patient is on Treatment Plan : MYELOMA RELAPSED / REFRACTORY Daratumumab SQ + Bortezomib + Dexamethasone (DaraVd) q21d / Daratumumab SQ q28d         INTERVAL HISTORY:   Alexandra Price is a 77 y.o. female presenting to clinic today for follow up of multiple myeloma. She was last seen by me on 06/19/23.  Today, she states that she is doing well overall. Her appetite level is at 100%. Her energy level is at 100%. She reports right sided abdominal pain with an onset of a few months ago. She is taking Darzalex, dexamethasone, hydrocodone, Eliquis, and magnesium as prescribed. She denies any bleeding issues. She is taking Gabapentin 300 mg BID. Her neuropathy has slightly worsened with constant numbness in toes and occasional tingling and aching in the fingertips. She denies any difficulty grasping objects or doing mechanical tasks with the hands.   She reports ankle swellings and has been prescribed lasix in the past, though she is currently out.   PAST MEDICAL HISTORY:   Past Medical History: Past Medical History:  Diagnosis Date   Anxiety    Chronic kidney disease    Depression    Hypertension    Multiple myeloma (HCC)    multiple myeloma   Pre-diabetes     Surgical History: Past Surgical History:  Procedure Laterality Date   ABDOMINAL HYSTERECTOMY     total   APPENDECTOMY     BALLOON DILATION N/A 05/28/2022   Procedure: BALLOON DILATION;  Surgeon: Lanelle Bal, DO;  Location: [facility];  Service:  Endoscopy;  Laterality: N/A;  BIOPSY  05/28/2022   Procedure: BIOPSY;  Surgeon: Lanelle Bal, DO;  Location: [facility];  Service: Endoscopy;;   COLONOSCOPY WITH PROPOFOL N/A 12/25/2021   Procedure: COLONOSCOPY WITH PROPOFOL;  Surgeon: Lanelle Bal, DO;  Location: [facility];  Service: Endoscopy;  Laterality: N/A;  2:30pm   ESOPHAGOGASTRODUODENOSCOPY (EGD) WITH PROPOFOL N/A 05/28/2022   Procedure: ESOPHAGOGASTRODUODENOSCOPY (EGD) WITH PROPOFOL;  Surgeon: Lanelle Bal, DO;  Location: [facility];  Service: Endoscopy;  Laterality: N/A;  10:00am, asa 3   LAPAROSCOPIC APPENDECTOMY N/A 05/20/2018   Procedure: APPENDECTOMY LAPAROSCOPIC;  Surgeon: Franky Macho, MD;  Location: [facility];  Service: General;  Laterality: N/A;   POLYPECTOMY  12/25/2021   Procedure: POLYPECTOMY;  Surgeon: Lanelle Bal, DO;  Location: [facility];  Service: Endoscopy;;   PORTACATH PLACEMENT Right 04/14/2018   Procedure: INSERTION PORT-A-CATH;  Surgeon: Franky Macho, MD;  Location: [facility];  Service: General;  Laterality: Right;    Social History: Social History   Socioeconomic History   Marital status: Divorced    Spouse name: Not on file   Number of children: 6   Years of education: Not on file   Highest education level: Not on file  Occupational History    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  Tobacco Use   Smoking status: Never   Smokeless tobacco: Never  Vaping Use   Vaping status: Never Used  Substance and Sexual Activity   Alcohol use: Never   Drug use: Not Currently   Sexual activity: Not Currently  Other Topics Concern   Not on file  Social History Narrative   Not on file   Social Drivers of Health   Financial Resource Strain: Low Risk  (07/06/2020)   Overall Financial Resource Strain (CARDIA)    Difficulty of Paying Living Expenses: Not hard at all  Food Insecurity: No Food Insecurity (09/17/2022)   Hunger Vital Sign    Worried About Running Out of Food in the Last Year:  Never true    Ran Out of Food in the Last Year: Never true  Transportation Needs: No Transportation Needs (09/17/2022)   PRAPARE - Administrator, Civil Service (Medical): No    Lack of Transportation (Non-Medical): No  Physical Activity: Inactive (07/06/2020)   Exercise Vital Sign    Days of Exercise per Week: 0 days    Minutes of Exercise per Session: 0 min  Stress: No Stress Concern Present (07/06/2020)   [employer] of Occupational Health - Occupational Stress Questionnaire    Feeling of Stress : Not at all  Social Connections: Moderately Isolated (07/06/2020)   Social Connection and Isolation Panel [NHANES]    Frequency of Communication with Friends and Family: More than three times a week    Frequency of Social Gatherings with Friends and Family: Twice a week    Attends Religious Services: 1 to 4 times per year    Active Member of [employer] or Organizations: No    Attends Banker Meetings: Never    Marital Status: Separated  Intimate Partner Violence: Not At Risk (09/17/2022)   Humiliation, Afraid, Rape, and Kick questionnaire    Fear of Current or Ex-Partner: No    Emotionally Abused: No    Physically Abused: No    Sexually Abused: No    Family History: Family History  Problem Relation Age of Onset   Heart disease Mother    Emphysema Father    Diabetes Sister    Depression Sister  Cancer Brother        liver, lung, and colon. colon cancer at age 58.   Diabetes Brother     Current Medications:  Current Outpatient Medications:    acetaminophen (TYLENOL) 325 MG tablet, Take 650 mg by mouth as needed for moderate pain (prior to infusion)., Disp: , Rfl:    acyclovir (ZOVIRAX) 400 MG tablet, Take 1 tablet (400 mg total) by mouth 2 (two) times daily., Disp: 60 tablet, Rfl: 6   ALPRAZolam (XANAX) 0.25 MG tablet, Take one tablet (0.25 mg) by mouth in the morning as needed, and two tablets (0.5 mg) at bedtime as needed, Disp: 90 tablet, Rfl: 2    Calcium Carb-Cholecalciferol (CALCIUM 1000 + D PO), Take 1,000 mg by mouth daily., Disp: , Rfl:    dexAMETHasone 20 MG TABS, Take 1 tablet by mouth as directed. Take 1 tablet 30 minutes prior to infusions (Patient taking differently: Take 1 tablet by mouth as directed. Take 1 tablet 30 minutes prior to the Darzelex injections-once a month), Disp: 30 tablet, Rfl: 2   diphenhydrAMINE (BENADRYL) 25 MG tablet, Take 50 mg by mouth as needed (prior to infusion)., Disp: , Rfl:    ELIQUIS 2.5 MG TABS tablet, Take 2.5 mg by mouth 2 (two) times daily., Disp: , Rfl:    escitalopram (LEXAPRO) 20 MG tablet, Take 1 tablet by mouth once daily, Disp: 30 tablet, Rfl: 0   gabapentin (NEURONTIN) 300 MG capsule, Take 1 capsule by mouth twice daily, Disp: 60 capsule, Rfl: 0   glipiZIDE (GLUCOTROL) 5 MG tablet, Take 1 tablet (5 mg total) by mouth daily before breakfast., Disp: 30 tablet, Rfl: 5   HYDROcodone-acetaminophen (NORCO) 10-325 MG tablet, Take 1 tablet by mouth every 6 (six) hours as needed., Disp: 120 tablet, Rfl: 0   magnesium oxide (MAG-OX) 400 (240 Mg) MG tablet, Take 1 tablet by mouth 2 (two) times daily., Disp: , Rfl:    midodrine (PROAMATINE) 2.5 MG tablet, Take 1 tablet (2.5 mg total) by mouth 3 (three) times daily with meals., Disp: 90 tablet, Rfl: 1   Multiple Vitamin (MULTI VITAMIN) TABS, Take 1 tablet by mouth daily., Disp: , Rfl:    pantoprazole (PROTONIX) 40 MG tablet, Take 1 tablet by mouth once daily, Disp: 90 tablet, Rfl: 0   polyethylene glycol (MIRALAX / GLYCOLAX) 17 g packet, Take 17 g by mouth daily as needed for moderate constipation., Disp: 14 each, Rfl: 0   prochlorperazine (COMPAZINE) 10 MG tablet, Take 1 tablet (10 mg total) by mouth every 6 (six) hours as needed for nausea or vomiting., Disp: 30 tablet, Rfl: 0   rosuvastatin (CRESTOR) 10 MG tablet, Take 10 mg by mouth daily., Disp: , Rfl:    zolpidem (AMBIEN) 10 MG tablet, Take 1 tablet (10 mg total) by mouth at bedtime as needed., Disp:  30 tablet, Rfl: 5   furosemide (LASIX) 20 MG tablet, Take 1 tablet (20 mg total) by mouth as needed (in am as needed for swelling)., Disp: 30 tablet, Rfl: 2 No current facility-administered medications for this visit.  Facility-Administered Medications Ordered in Other Visits:    acetaminophen (TYLENOL) tablet 650 mg, 650 mg, Oral, Once, Doreatha Massed, MD   [COMPLETED] daratumumab-hyaluronidase-fihj [facility] FASPRO) 1800-30000 MG-UT/15ML chemo SQ injection 1,800 mg, 1,800 mg, Subcutaneous, Once, Doreatha Massed, MD, 1,800 mg at 08/13/23 1135   dexamethasone (DECADRON) tablet 20 mg, 20 mg, Oral, Once, Doreatha Massed, MD   diphenhydrAMINE (BENADRYL) capsule 50 mg, 50 mg, Oral, Once, Doreatha Massed, MD  Allergies: Allergies  Allergen Reactions   Ciprofloxacin Anaphylaxis   Amoxicillin Other (See Comments)    unknown   Morphine And Codeine Nausea And Vomiting   Augmentin [Amoxicillin-Pot Clavulanate] Other (See Comments)    Headache, insomnia    REVIEW OF SYSTEMS:   Review of Systems  Constitutional:  Negative for chills, fatigue and fever.  HENT:   Negative for lump/mass, mouth sores, nosebleeds, sore throat and trouble swallowing.   Eyes:  Negative for eye problems.  Respiratory:  Positive for shortness of breath. Negative for cough.   Cardiovascular:  Negative for chest pain, leg swelling and palpitations.  Gastrointestinal:  Positive for abdominal pain (right side, 6-7/10 severity). Negative for constipation, diarrhea, nausea and vomiting.  Genitourinary:  Negative for bladder incontinence, difficulty urinating, dysuria, frequency, hematuria and nocturia.   Musculoskeletal:  Negative for arthralgias, back pain, flank pain, myalgias and neck pain.  Skin:  Negative for itching and rash.  Neurological:  Negative for dizziness, headaches and numbness.  Hematological:  Does not bruise/bleed easily.  Psychiatric/Behavioral:  Positive for sleep disturbance.  Negative for depression and suicidal ideas. The patient is not nervous/anxious.   All other systems reviewed and are negative.    VITALS:   Weight 195 lb 12.8 oz (88.8 kg).  Wt Readings from Last 3 Encounters:  08/13/23 195 lb 12.8 oz (88.8 kg)  07/31/23 194 lb 14.4 oz (88.4 kg)  07/17/23 195 lb 3.2 oz (88.5 kg)    Body mass index is 33.61 kg/m.  Performance status (ECOG): 1 - Symptomatic but completely ambulatory  PHYSICAL EXAM:   Physical Exam Vitals and nursing note reviewed. Exam conducted with a chaperone present.  Constitutional:      Appearance: Normal appearance.  Cardiovascular:     Rate and Rhythm: Normal rate and regular rhythm.     Pulses: Normal pulses.     Heart sounds: Normal heart sounds.  Pulmonary:     Effort: Pulmonary effort is normal.     Breath sounds: Normal breath sounds.  Abdominal:     Palpations: Abdomen is soft. There is no hepatomegaly, splenomegaly or mass.     Tenderness: There is no abdominal tenderness.  Musculoskeletal:     Right lower leg: 1+ Edema present.     Left lower leg: 1+ Edema present.  Lymphadenopathy:     Cervical: No cervical adenopathy.     Right cervical: No superficial, deep or posterior cervical adenopathy.    Left cervical: No superficial, deep or posterior cervical adenopathy.     Upper Body:     Right upper body: No supraclavicular or axillary adenopathy.     Left upper body: No supraclavicular or axillary adenopathy.  Neurological:     General: No focal deficit present.     Mental Status: She is alert and oriented to person, place, and time.  Psychiatric:        Mood and Affect: Mood normal.        Behavior: Behavior normal.     LABS:      Latest Ref Rng & Units 08/13/2023    9:12 AM 07/31/2023   11:56 AM 07/17/2023   12:29 PM  CBC  WBC 4.0 - 10.5 K/uL 3.7  4.1  3.1   Hemoglobin 12.0 - 15.0 g/dL 46.9  9.8  62.9   Hematocrit 36.0 - 46.0 % 32.0  30.9  31.3   Platelets 150 - 400 K/uL 137  128  131        Latest Ref  Rng & Units 08/13/2023    9:12 AM 07/31/2023   11:56 AM 07/17/2023   12:29 PM  CMP  Glucose 70 - 99 mg/dL 161  096  85   BUN 8 - 23 mg/dL 25  22  31    Creatinine 0.44 - 1.00 mg/dL 0.45  4.09  8.11   Sodium 135 - 145 mmol/L 137  137  138   Potassium 3.5 - 5.1 mmol/L 4.2  4.2  4.1   Chloride 98 - 111 mmol/L 106  102  103   CO2 22 - 32 mmol/L 25  26  28    Calcium 8.9 - 10.3 mg/dL 8.6  9.3  8.9   Total Protein 6.5 - 8.1 g/dL 6.3  6.4  6.6   Total Bilirubin <1.2 mg/dL 0.6  0.4  0.8   Alkaline Phos 38 - 126 U/L 87  86  81   AST 15 - 41 U/L 25  26  25    ALT 0 - 44 U/L 31  34  28      No results found for: "CEA1", "CEA" / No results found for: "CEA1", "CEA" No results found for: "PSA1" No results found for: "CAN199" No results found for: "CAN125"  Lab Results  Component Value Date   TOTALPROTELP 5.9 (L) 07/31/2023   ALBUMINELP 3.8 07/31/2023   A1GS 0.2 07/31/2023   A2GS 0.9 07/31/2023   BETS 0.7 07/31/2023   GAMS 0.3 (L) 07/31/2023   MSPIKE Not Observed 07/31/2023   SPEI Comment 07/31/2023   Lab Results  Component Value Date   TIBC 290 09/17/2022   TIBC 278 07/30/2022   TIBC 272 05/24/2022   FERRITIN 1,059 (H) 09/17/2022   FERRITIN 313 (H) 07/30/2022   FERRITIN 435 (H) 05/24/2022   IRONPCTSAT 10 (L) 09/17/2022   IRONPCTSAT 21 07/30/2022   IRONPCTSAT 27 05/24/2022   Lab Results  Component Value Date   LDH 129 05/24/2022   LDH 135 03/20/2022   LDH 129 12/14/2021     STUDIES:   No results found.

## 2023-08-13 NOTE — Progress Notes (Signed)
Patient tolerated Darzalex and Vecade  injection with no complaints voiced.  Site clean and dry with no bruising or swelling noted at site.  See MAR for details.  Band aid applied.  Patient stable during and after injection.  Vss with discharge and left in satisfactory condition with no s/s of distress noted. All follow ups as scheduled.  Juliani Laduke [employer]

## 2023-08-14 ENCOUNTER — Other Ambulatory Visit: Payer: Self-pay

## 2023-08-22 ENCOUNTER — Other Ambulatory Visit: Payer: Self-pay | Admitting: *Deleted

## 2023-08-22 MED ORDER — MIDODRINE HCL 2.5 MG PO TABS: 2.5000 mg | ORAL_TABLET | Freq: Three times a day (TID) | ORAL | 1 refills | Status: DC

## 2023-08-22 MED ORDER — ALPRAZOLAM 0.25 MG PO TABS: ORAL_TABLET | ORAL | 2 refills | Status: DC

## 2023-08-27 ENCOUNTER — Ambulatory Visit: Payer: 59 | Admitting: Neurology

## 2023-08-27 ENCOUNTER — Encounter: Payer: Self-pay | Admitting: Neurology

## 2023-08-27 VITALS — BP 176/83 | HR 72 | Ht 65.0 in | Wt 194.0 lb

## 2023-08-27 DIAGNOSIS — R202 Paresthesia of skin: Secondary | ICD-10-CM

## 2023-08-27 DIAGNOSIS — R4789 Other speech disturbances: Secondary | ICD-10-CM

## 2023-08-27 NOTE — Progress Notes (Signed)
 Chief Complaint  Patient presents with   New Patient (Initial Visit)    Rm14, alone, internal referral for Sudden onset stuttering/aphasia, right sided weakness:pt stated happens on occasion, triggered by stress   ASSESSMENT AND PLAN  Alexandra Price is a 78 y.o. female   Intermittent confusion  It happened in the setting of acute infection,  most suggestive of acute metabolic encephalopathy with a background of impairment mild cognitive impairment at baseline, MoCA examination is 25/ 30 today, MRI of the brain showed mild age-related changes no acute abnormality,  EEG, Paresthesia  History of chemotherapy, length-dependent sensory changes, suggest peripheral neuropathy  EMG nerve conduction study     DIAGNOSTIC DATA (LABS, IMAGING, TESTING) - I reviewed patient records, labs, notes, testing and imaging myself where available.   MEDICAL HISTORY:  Alexandra Price is a 78 year old female, seen in request by her oncologist Dr. Rogers Hai, for evaluation of intermittent confusion, her primary care is Dr. Katrinka, K[place], initial evaluation was on August 29, 2023  .   History is obtained from the patient and review of electronic medical records. I personally reviewed pertinent available imaging films in PACS.   PMHx of  Multiple myeloma in 2020, s/p bone marrow transplant, chemotherapy HLD DM History of DVT, PE.  She lives with her son at home, used to work as a engineering geologist job, there was no family history of memory  Over the past couple years, she has intermittent confusion episode, most obvious 1 was in December 2023, she was sick with acute bronchitis, acute hypoxic respiratory failure, required hospitalization, oxygen  treatment, she lost memory of the day when she was really ill,  She is functioning well in her daily activity, with occasionally word finding difficulties, She had a history of multiple myeloma, had a bone marrow transplant, chemotherapy, since  chemotherapy about 4 years ago she noticed numbness tingling of her lower extremity, which has been persistent with slight worsening  ECHO was normal in Jan 2024,   PHYSICAL EXAM:   Vitals:   08/27/23 1352 08/27/23 1354  BP: (!) 192/81 (!) 176/83  Pulse: 72   Weight: 194 lb (88 kg)   Height: 5' 5 (1.651 m)    Body mass index is 32.28 kg/m.  PHYSICAL EXAMNIATION:  Gen: NAD, conversant, well nourised, well groomed                     Cardiovascular: Regular rate rhythm, no peripheral edema, warm, nontender. Eyes: Conjunctivae clear without exudates or hemorrhage Neck: Supple, no carotid bruits. Pulmonary: Clear to auscultation bilaterally   NEUROLOGICAL EXAM:  MENTAL STATUS: Speech/cognition: Awake, alert, oriented to history taking and casual conversation    08/27/2023    2:00 PM  Montreal Cognitive Assessment   Visuospatial/ Executive (0/5) 5  Naming (0/3) 3  Attention: Read list of digits (0/2) 2  Attention: Read list of letters (0/1) 1  Attention: Serial 7 subtraction starting at 100 (0/3) 3  Language: Repeat phrase (0/2) 2  Language : Fluency (0/1) 1  Abstraction (0/2) 2  Delayed Recall (0/5) 1  Orientation (0/6) 5  Total 25    CRANIAL NERVES: CN II: Visual fields are full to confrontation. Pupils are round equal and briskly reactive to light. CN III, IV, VI: extraocular movement are normal. No ptosis. CN V: Facial sensation is intact to light touch CN VII: Face is symmetric with normal eye closure  CN VIII: Hearing is normal to causal conversation. CN IX, X:  Phonation is normal. CN XI: Head turning and shoulder shrug are intact  MOTOR: There is no pronator drift of out-stretched arms. Muscle bulk and tone are normal. Muscle strength is normal.  REFLEXES: Reflexes are 1 and symmetric at the biceps, triceps, knees, and absent ankles. Plantar responses are flexor.  SENSORY: Mildly length-dependent decreased light touch, pinprick, vibratory sensation to  ankle level  COORDINATION: There is no trunk or limb dysmetria noted.  GAIT/STANCE: Push-up to get up from seated position, cautious  REVIEW OF SYSTEMS:  Full 14 system review of systems performed and notable only for as above All other review of systems were negative.   ALLERGIES: Allergies  Allergen Reactions   Ciprofloxacin  Anaphylaxis   Amoxicillin  Other (See Comments)    unknown   Morphine And Codeine Nausea And Vomiting   Augmentin [Amoxicillin -Pot Clavulanate] Other (See Comments)    Headache, insomnia    HOME MEDICATIONS: Current Outpatient Medications  Medication Sig Dispense Refill   acetaminophen  (TYLENOL ) 325 MG tablet Take 650 mg by mouth as needed for moderate pain (prior to infusion).     acyclovir  (ZOVIRAX ) 400 MG tablet Take 1 tablet (400 mg total) by mouth 2 (two) times daily. 60 tablet 6   ALPRAZolam  (XANAX ) 0.25 MG tablet Take one tablet (0.25 mg) by mouth in the morning as needed, and two tablets (0.5 mg) at bedtime as needed 90 tablet 2   Calcium  Carb-Cholecalciferol  (CALCIUM  1000 + D PO) Take 1,000 mg by mouth daily.     dexAMETHasone  20 MG TABS Take 1 tablet by mouth as directed. Take 1 tablet 30 minutes prior to infusions (Patient taking differently: Take 1 tablet by mouth as directed. Take 1 tablet 30 minutes prior to the Darzelex injections-once a month) 30 tablet 2   diphenhydrAMINE  (BENADRYL ) 25 MG tablet Take 50 mg by mouth as needed (prior to infusion).     ELIQUIS  2.5 MG TABS tablet Take 2.5 mg by mouth 2 (two) times daily.     escitalopram  (LEXAPRO ) 20 MG tablet Take 1 tablet by mouth once daily 30 tablet 0   furosemide  (LASIX ) 20 MG tablet Take 1 tablet (20 mg total) by mouth as needed (in am as needed for swelling). 30 tablet 2   gabapentin  (NEURONTIN ) 300 MG capsule Take 1 capsule by mouth twice daily 60 capsule 0   glipiZIDE  (GLUCOTROL ) 5 MG tablet Take 1 tablet (5 mg total) by mouth daily before breakfast. 30 tablet 5    HYDROcodone -acetaminophen  (NORCO) 10-325 MG tablet Take 1 tablet by mouth every 6 (six) hours as needed. 120 tablet 0   magnesium  oxide (MAG-OX) 400 (240 Mg) MG tablet Take 1 tablet by mouth 2 (two) times daily.     midodrine  (PROAMATINE ) 2.5 MG tablet Take 1 tablet (2.5 mg total) by mouth 3 (three) times daily with meals. 90 tablet 1   Multiple Vitamin (MULTI VITAMIN) TABS Take 1 tablet by mouth daily.     pantoprazole  (PROTONIX ) 40 MG tablet Take 1 tablet by mouth once daily 90 tablet 0   polyethylene glycol (MIRALAX  / GLYCOLAX ) 17 g packet Take 17 g by mouth daily as needed for moderate constipation. 14 each 0   prochlorperazine  (COMPAZINE ) 10 MG tablet Take 1 tablet (10 mg total) by mouth every 6 (six) hours as needed for nausea or vomiting. 30 tablet 0   rosuvastatin  (CRESTOR ) 10 MG tablet Take 10 mg by mouth daily.     zolpidem  (AMBIEN ) 10 MG tablet Take 1 tablet (10 mg total)  by mouth at bedtime as needed. 30 tablet 5   No current facility-administered medications for this visit.    PAST MEDICAL HISTORY: Past Medical History:  Diagnosis Date   Anxiety    Chronic kidney disease    Depression    Hypertension    Multiple myeloma (HCC)    multiple myeloma   Pre-diabetes     PAST SURGICAL HISTORY: Past Surgical History:  Procedure Laterality Date   ABDOMINAL HYSTERECTOMY     total   APPENDECTOMY     BALLOON DILATION N/A 05/28/2022   Procedure: BALLOON DILATION;  Surgeon: Alexandra Carlin POUR, DO;  [facility];  Service: Endoscopy;  Laterality: N/A;   BIOPSY  05/28/2022   Procedure: BIOPSY;  Surgeon: Alexandra Carlin POUR, DO;  [facility];  Service: Endoscopy;;   COLONOSCOPY WITH PROPOFOL  N/A 12/25/2021   Procedure: COLONOSCOPY WITH PROPOFOL ;  Surgeon: Alexandra Carlin POUR, DO;  [facility];  Service: Endoscopy;  Laterality: N/A;  2:30pm   ESOPHAGOGASTRODUODENOSCOPY (EGD) WITH PROPOFOL  N/A 05/28/2022   Procedure: ESOPHAGOGASTRODUODENOSCOPY (EGD) WITH  PROPOFOL ;  Surgeon: Alexandra Carlin POUR, DO;  [facility];  Service: Endoscopy;  Laterality: N/A;  10:00am, asa 3   LAPAROSCOPIC APPENDECTOMY N/A 05/20/2018   Procedure: APPENDECTOMY LAPAROSCOPIC;  Surgeon: Alexandra Anes, MD;  Location: [facility];  Service: General;  Laterality: N/A;   POLYPECTOMY  12/25/2021   Procedure: POLYPECTOMY;  Surgeon: Alexandra Carlin POUR, DO;  [facility];  Service: Endoscopy;;   PORTACATH PLACEMENT Right 04/14/2018   Procedure: INSERTION PORT-A-CATH;  Surgeon: Alexandra Anes, MD;  Location: [facility];  Service: General;  Laterality: Right;    FAMILY HISTORY: Family History  Problem Relation Age of Onset   Heart disease Mother    Emphysema Father    Diabetes Sister    Depression Sister    Cancer Brother        liver, lung, and colon. colon cancer at age 81.   Diabetes Brother     SOCIAL HISTORY: Social History   Socioeconomic History   Marital status: Divorced    Spouse name: Not on file   Number of children: 6   Years of education: Not on file   Highest education level: Not on file  Occupational History    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  Tobacco Use   Smoking status: Never   Smokeless tobacco: Never  Vaping Use   Vaping status: Never Used  Substance and Sexual Activity   Alcohol use: Never   Drug use: Not Currently   Sexual activity: Not Currently  Other Topics Concern   Not on file  Social History Narrative   Not on file   Social Drivers of Health   Financial Resource Strain: Low Risk  (07/06/2020)   Overall Financial Resource Strain (CARDIA)    Difficulty of Paying Living Expenses: Not hard at all  Food Insecurity: No Food Insecurity (09/17/2022)   Hunger Vital Sign    Worried About Running Out of Food in the Last Year: Never true    Ran Out of Food in the Last Year: Never true  Transportation Needs: No Transportation Needs (09/17/2022)   PRAPARE - Administrator, Civil Service (Medical): No    Lack of  Transportation (Non-Medical): No  Physical Activity: Inactive (07/06/2020)   Exercise Vital Sign    Days of Exercise per Week: 0 days    Minutes of Exercise per Session: 0 min  Stress: No Stress Concern Present (07/06/2020)  [employer] of Occupational Health - Occupational Stress Questionnaire    Feeling of Stress : Not at all  Social Connections: Moderately Isolated (07/06/2020)   Social Connection and Isolation Panel [NHANES]    Frequency of Communication with Friends and Family: More than three times a week    Frequency of Social Gatherings with Friends and Family: Twice a week    Attends Religious Services: 1 to 4 times per year    Active Member of [employer] or Organizations: No    Attends Banker Meetings: Never    Marital Status: Separated  Intimate Partner Violence: Not At Risk (09/17/2022)   Humiliation, Afraid, Rape, and Kick questionnaire    Fear of Current or Ex-Partner: No    Emotionally Abused: No    Physically Abused: No    Sexually Abused: No      Modena Callander, M.D. Ph.D.  [facility] [address] [place], [place] [address] Ph: [phone] Fax: [phone]  CC:  Rogers Hai, MD [address] [place],  [place] [address]  Katrinka Aquas, MD

## 2023-08-28 ENCOUNTER — Inpatient Hospital Stay: Payer: 59

## 2023-08-28 ENCOUNTER — Inpatient Hospital Stay: Payer: 59 | Attending: Hematology

## 2023-08-28 DIAGNOSIS — C9000 Multiple myeloma not having achieved remission: Secondary | ICD-10-CM

## 2023-08-28 DIAGNOSIS — Z5112 Encounter for antineoplastic immunotherapy: Secondary | ICD-10-CM | POA: Diagnosis present

## 2023-08-28 LAB — COMPREHENSIVE METABOLIC PANEL
ALT: 45 U/L — ABNORMAL HIGH (ref 0–44)
AST: 30 U/L (ref 15–41)
Albumin: 3.9 g/dL (ref 3.5–5.0)
Alkaline Phosphatase: 84 U/L (ref 38–126)
Anion gap: 7 (ref 5–15)
BUN: 27 mg/dL — ABNORMAL HIGH (ref 8–23)
CO2: 27 mmol/L (ref 22–32)
Calcium: 9 mg/dL (ref 8.9–10.3)
Chloride: 101 mmol/L (ref 98–111)
Creatinine, Ser: 1.51 mg/dL — ABNORMAL HIGH (ref 0.44–1.00)
GFR, Estimated: 36 mL/min — ABNORMAL LOW (ref >60)
Glucose, Bld: 180 mg/dL — ABNORMAL HIGH (ref 70–99)
Potassium: 4.2 mmol/L (ref 3.5–5.1)
Sodium: 135 mmol/L (ref 135–145)
Total Bilirubin: 0.5 mg/dL (ref 0.0–1.2)
Total Protein: 6.5 g/dL (ref 6.5–8.1)

## 2023-08-28 LAB — CBC WITH DIFFERENTIAL/PLATELET
Abs Immature Granulocytes: 0.01 10*3/uL (ref 0.00–0.07)
Basophils Absolute: 0 10*3/uL (ref 0.0–0.1)
Basophils Relative: 1 %
Eosinophils Absolute: 0.2 10*3/uL (ref 0.0–0.5)
Eosinophils Relative: 5 %
HCT: 32.4 % — ABNORMAL LOW (ref 36.0–46.0)
Hemoglobin: 10.4 g/dL — ABNORMAL LOW (ref 12.0–15.0)
Immature Granulocytes: 0 %
Lymphocytes Relative: 15 %
Lymphs Abs: 0.5 10*3/uL — ABNORMAL LOW (ref 0.7–4.0)
MCH: 31.9 pg (ref 26.0–34.0)
MCHC: 32.1 g/dL (ref 30.0–36.0)
MCV: 99.4 fL (ref 80.0–100.0)
Monocytes Absolute: 0.3 10*3/uL (ref 0.1–1.0)
Monocytes Relative: 9 %
Neutro Abs: 2.5 10*3/uL (ref 1.7–7.7)
Neutrophils Relative %: 70 %
Platelets: 125 10*3/uL — ABNORMAL LOW (ref 150–400)
RBC: 3.26 MIL/uL — ABNORMAL LOW (ref 3.87–5.11)
RDW: 12.8 % (ref 11.5–15.5)
WBC: 3.5 10*3/uL — ABNORMAL LOW (ref 4.0–10.5)
nRBC: 0 % (ref 0.0–0.2)

## 2023-08-28 MED ORDER — SODIUM CHLORIDE 0.9% FLUSH
10.0000 mL | INTRAVENOUS | Status: DC | PRN
  Administered 2023-08-28: 10 mL via INTRAVENOUS

## 2023-08-28 MED ORDER — BORTEZOMIB CHEMO SQ INJECTION 3.5 MG (2.5MG/ML)
1.0000 mg/m2 | Freq: Once | INTRAMUSCULAR | Status: AC
  Administered 2023-08-28: 2 mg via SUBCUTANEOUS
  Filled 2023-08-28: qty 0.8

## 2023-08-28 MED ORDER — HEPARIN SOD (PORK) LOCK FLUSH 100 UNIT/ML IV SOLN
500.0000 [IU] | Freq: Once | INTRAVENOUS | Status: AC
  Administered 2023-08-28: 500 [IU] via INTRAVENOUS

## 2023-08-28 NOTE — Patient Instructions (Signed)
 [facility] [place] - [facility] OF [facility]  Discharge Instructions: Thank you for choosing [facility] to provide your oncology and hematology care.  If you have a lab appointment with the Cancer Center - please note that after April 8th, 2024, all labs will be drawn in the cancer center.  You do not have to check in or register with the main entrance as you have in the past but will complete your check-in in the cancer center.  Wear comfortable clothing and clothing appropriate for easy access to any Portacath or PICC line.   We strive to give you quality time with your provider. You may need to reschedule your appointment if you arrive late (15 or more minutes).  Arriving late affects you and other patients whose appointments are after yours.  Also, if you miss three or more appointments without notifying the office, you may be dismissed from the clinic at the provider's discretion.      For prescription refill requests, have your pharmacy contact our office and allow 72 hours for refills to be completed.    Today you received the following chemotherapy and/or immunotherapy agents Velcade      To help prevent nausea and vomiting after your treatment, we encourage you to take your nausea medication as directed.  BELOW ARE SYMPTOMS THAT SHOULD BE REPORTED IMMEDIATELY: *FEVER GREATER THAN 100.4 F (38 C) OR HIGHER *CHILLS OR SWEATING *NAUSEA AND VOMITING THAT IS NOT CONTROLLED WITH YOUR NAUSEA MEDICATION *UNUSUAL SHORTNESS OF BREATH *UNUSUAL BRUISING OR BLEEDING *URINARY PROBLEMS (pain or burning when urinating, or frequent urination) *BOWEL PROBLEMS (unusual diarrhea, constipation, pain near the anus) TENDERNESS IN MOUTH AND THROAT WITH OR WITHOUT PRESENCE OF ULCERS (sore throat, sores in mouth, or a toothache) UNUSUAL RASH, SWELLING OR PAIN  UNUSUAL VAGINAL DISCHARGE OR ITCHING   Items with * indicate a potential emergency and should be followed up  as soon as possible or go to the Emergency Department if any problems should occur.  Please show the CHEMOTHERAPY ALERT CARD or IMMUNOTHERAPY ALERT CARD at check-in to the Emergency Department and triage nurse.  Should you have questions after your visit or need to cancel or reschedule your appointment, please contact [facility] [place] - A DEPT OF [facility] [phone]  and follow the prompts.  Office hours are 8:00 a.m. to 4:30 p.m. Monday - Friday. Please note that voicemails left after 4:00 p.m. may not be returned until the following business day.  We are closed weekends and major holidays. You have access to a nurse at all times for urgent questions. Please call the main number to the clinic [phone] and follow the prompts.  For any non-urgent questions, you may also contact your provider using MyChart. We now offer e-Visits for anyone 44 and older to request care online for non-urgent symptoms. For details visit [email or web address].   Also download the MyChart app! Go to the app store, search "MyChart", open the app, select [place], and log in with your MyChart username and password.

## 2023-08-28 NOTE — Progress Notes (Signed)
 Patient tolerated Velcade injection with no complaints voiced.  Site clean and dry with no bruising or swelling noted at site.  See MAR for details.  Band aid applied.  Patient stable during and after injection.  Vss with discharge and left in satisfactory condition with no s/s of distress noted. All follow ups as scheduled.  Alexandra Price [employer]

## 2023-09-04 ENCOUNTER — Other Ambulatory Visit: Payer: Self-pay | Admitting: *Deleted

## 2023-09-04 MED ORDER — GABAPENTIN 300 MG PO CAPS: 300.0000 mg | ORAL_CAPSULE | Freq: Two times a day (BID) | ORAL | 0 refills | Status: DC

## 2023-09-05 ENCOUNTER — Other Ambulatory Visit: Payer: Self-pay

## 2023-09-07 ENCOUNTER — Other Ambulatory Visit: Payer: Self-pay | Admitting: Hematology

## 2023-09-09 ENCOUNTER — Encounter: Payer: Self-pay | Admitting: Hematology

## 2023-09-10 ENCOUNTER — Encounter: Payer: Self-pay | Admitting: Hematology

## 2023-09-11 ENCOUNTER — Inpatient Hospital Stay: Payer: 59

## 2023-09-11 DIAGNOSIS — C9000 Multiple myeloma not having achieved remission: Secondary | ICD-10-CM

## 2023-09-11 DIAGNOSIS — Z5112 Encounter for antineoplastic immunotherapy: Secondary | ICD-10-CM | POA: Diagnosis not present

## 2023-09-11 LAB — CBC WITH DIFFERENTIAL/PLATELET
Abs Immature Granulocytes: 0 10*3/uL (ref 0.00–0.07)
Basophils Absolute: 0 10*3/uL (ref 0.0–0.1)
Basophils Relative: 1 %
Eosinophils Absolute: 0.2 10*3/uL (ref 0.0–0.5)
Eosinophils Relative: 7 %
HCT: 32.3 % — ABNORMAL LOW (ref 36.0–46.0)
Hemoglobin: 10.2 g/dL — ABNORMAL LOW (ref 12.0–15.0)
Immature Granulocytes: 0 %
Lymphocytes Relative: 21 %
Lymphs Abs: 0.6 10*3/uL — ABNORMAL LOW (ref 0.7–4.0)
MCH: 31.5 pg (ref 26.0–34.0)
MCHC: 31.6 g/dL (ref 30.0–36.0)
MCV: 99.7 fL (ref 80.0–100.0)
Monocytes Absolute: 0.4 10*3/uL (ref 0.1–1.0)
Monocytes Relative: 13 %
Neutro Abs: 1.6 10*3/uL — ABNORMAL LOW (ref 1.7–7.7)
Neutrophils Relative %: 58 %
Platelets: 136 10*3/uL — ABNORMAL LOW (ref 150–400)
RBC: 3.24 MIL/uL — ABNORMAL LOW (ref 3.87–5.11)
RDW: 12.8 % (ref 11.5–15.5)
WBC: 2.7 10*3/uL — ABNORMAL LOW (ref 4.0–10.5)
nRBC: 0 % (ref 0.0–0.2)

## 2023-09-11 LAB — COMPREHENSIVE METABOLIC PANEL
ALT: 32 U/L (ref 0–44)
AST: 25 U/L (ref 15–41)
Albumin: 4 g/dL (ref 3.5–5.0)
Alkaline Phosphatase: 92 U/L (ref 38–126)
Anion gap: 8 (ref 5–15)
BUN: 28 mg/dL — ABNORMAL HIGH (ref 8–23)
CO2: 31 mmol/L (ref 22–32)
Calcium: 9 mg/dL (ref 8.9–10.3)
Chloride: 100 mmol/L (ref 98–111)
Creatinine, Ser: 1.59 mg/dL — ABNORMAL HIGH (ref 0.44–1.00)
GFR, Estimated: 34 mL/min — ABNORMAL LOW (ref >60)
Glucose, Bld: 170 mg/dL — ABNORMAL HIGH (ref 70–99)
Potassium: 4.5 mmol/L (ref 3.5–5.1)
Sodium: 139 mmol/L (ref 135–145)
Total Bilirubin: 0.4 mg/dL (ref 0.0–1.2)
Total Protein: 6.3 g/dL — ABNORMAL LOW (ref 6.5–8.1)

## 2023-09-11 MED ORDER — HEPARIN SOD (PORK) LOCK FLUSH 100 UNIT/ML IV SOLN
500.0000 [IU] | Freq: Once | INTRAVENOUS | Status: AC
  Administered 2023-09-11: 500 [IU] via INTRAVENOUS

## 2023-09-11 MED ORDER — SODIUM CHLORIDE 0.9% FLUSH
10.0000 mL | INTRAVENOUS | Status: AC
  Administered 2023-09-11: 10 mL

## 2023-09-11 MED ORDER — DARATUMUMAB-HYALURONIDASE-FIHJ 1800-30000 MG-UT/15ML ~~LOC~~ SOLN
1800.0000 mg | Freq: Once | SUBCUTANEOUS | Status: AC
  Administered 2023-09-11: 1800 mg via SUBCUTANEOUS
  Filled 2023-09-11: qty 15

## 2023-09-11 MED ORDER — BORTEZOMIB CHEMO SQ INJECTION 3.5 MG (2.5MG/ML)
1.0000 mg/m2 | Freq: Once | INTRAMUSCULAR | Status: AC
  Administered 2023-09-11: 2 mg via SUBCUTANEOUS
  Filled 2023-09-11: qty 0.8

## 2023-09-11 NOTE — Progress Notes (Signed)
Patient presents today for Dara and Velcade. Creatinine 1.59, patient okay for treatment per Dr. Ellin Saba. Patient took pre-meds at 0900 this morning. Patient tolerated Velcade injection with no complaints voiced. Lab work reviewed. See MAR for details. Injection site clean and dry with no bruising or swelling noted. Patient stable during and after injection.  Patient tolerated Daratumumab injection with no complaints voiced. See MAR for details. Lab reviewed. Injection site clean and dry with no bruising or swelling noted at site. Band aid applied. Vss with discharge and left in satisfactory condition with nos/s of distress noted.

## 2023-09-11 NOTE — Patient Instructions (Signed)
[facility] [place] [facility]  Discharge Instructions: Thank you for choosing [facility] to provide your oncology and hematology care.  If you have a lab appointment with the Cancer Center - please note that after April 8th, 2024, all labs will be drawn in the cancer center.  You do not have to check in or register with the main entrance as you have in the past but will complete your check-in in the cancer center.  Wear comfortable clothing and clothing appropriate for easy access to any Portacath or PICC line.   We strive to give you quality time with your provider. You may need to reschedule your appointment if you arrive late (15 or more minutes).  Arriving late affects you and other patients whose appointments are after yours.  Also, if you miss three or more appointments without notifying the office, you may be dismissed from the clinic at the provider's discretion.      For prescription refill requests, have your pharmacy contact our office and allow 72 hours for refills to be completed.    Today you received the following chemotherapy and/or immunotherapy agents Daratumumab and Velcade, return as scheduled.   To help prevent nausea and vomiting after your treatment, we encourage you to take your nausea medication as directed.  BELOW ARE SYMPTOMS THAT SHOULD BE REPORTED IMMEDIATELY: *FEVER GREATER THAN 100.4 F (38 C) OR HIGHER *CHILLS OR SWEATING *NAUSEA AND VOMITING THAT IS NOT CONTROLLED WITH YOUR NAUSEA MEDICATION *UNUSUAL SHORTNESS OF BREATH *UNUSUAL BRUISING OR BLEEDING *URINARY PROBLEMS (pain or burning when urinating, or frequent urination) *BOWEL PROBLEMS (unusual diarrhea, constipation, pain near the anus) TENDERNESS IN MOUTH AND THROAT WITH OR WITHOUT PRESENCE OF ULCERS (sore throat, sores in mouth, or a toothache) UNUSUAL RASH, SWELLING OR PAIN  UNUSUAL VAGINAL DISCHARGE OR ITCHING   Items with * indicate a potential  emergency and should be followed up as soon as possible or go to the Emergency Department if any problems should occur.  Please show the CHEMOTHERAPY ALERT CARD or IMMUNOTHERAPY ALERT CARD at check-in to the Emergency Department and triage nurse.  Should you have questions after your visit or need to cancel or reschedule your appointment, please contact [facility] [place] - A DEPT OF [facility] [phone]  and follow the prompts.  Office hours are 8:00 a.m. to 4:30 p.m. Monday - Friday. Please note that voicemails left after 4:00 p.m. may not be returned until the following business day.  We are closed weekends and major holidays. You have access to a nurse at all times for urgent questions. Please call the main number to the clinic [phone] and follow the prompts.  For any non-urgent questions, you may also contact your provider using MyChart. We now offer e-Visits for anyone 32 and older to request care online for non-urgent symptoms. For details visit [email or web address].   Also download the MyChart app! Go to the app store, search "MyChart", open the app, select [place], and log in with your MyChart username and password.

## 2023-09-12 ENCOUNTER — Other Ambulatory Visit: Payer: Self-pay | Admitting: *Deleted

## 2023-09-12 DIAGNOSIS — C9000 Multiple myeloma not having achieved remission: Secondary | ICD-10-CM

## 2023-09-12 MED ORDER — HYDROCODONE-ACETAMINOPHEN 10-325 MG PO TABS: 1.0000 | ORAL_TABLET | Freq: Four times a day (QID) | ORAL | 0 refills | Status: DC | PRN

## 2023-09-19 ENCOUNTER — Ambulatory Visit (INDEPENDENT_AMBULATORY_CARE_PROVIDER_SITE_OTHER): Payer: 59 | Admitting: Neurology

## 2023-09-19 DIAGNOSIS — R4701 Aphasia: Secondary | ICD-10-CM

## 2023-09-19 DIAGNOSIS — R4789 Other speech disturbances: Secondary | ICD-10-CM

## 2023-09-23 ENCOUNTER — Telehealth: Payer: Self-pay | Admitting: Internal Medicine

## 2023-09-23 NOTE — Telephone Encounter (Signed)
Patient called and I returned the call but had to leave a mesage

## 2023-09-25 ENCOUNTER — Inpatient Hospital Stay: Payer: 59 | Attending: Hematology

## 2023-09-25 ENCOUNTER — Inpatient Hospital Stay: Payer: 59

## 2023-09-25 VITALS — BP 135/80 | HR 76 | Temp 97.3°F | Resp 20 | Wt 191.5 lb

## 2023-09-25 DIAGNOSIS — Z5112 Encounter for antineoplastic immunotherapy: Secondary | ICD-10-CM | POA: Insufficient documentation

## 2023-09-25 DIAGNOSIS — C9000 Multiple myeloma not having achieved remission: Secondary | ICD-10-CM

## 2023-09-25 DIAGNOSIS — Z95828 Presence of other vascular implants and grafts: Secondary | ICD-10-CM

## 2023-09-25 LAB — COMPREHENSIVE METABOLIC PANEL
ALT: 34 U/L (ref 0–44)
AST: 26 U/L (ref 15–41)
Albumin: 4 g/dL (ref 3.5–5.0)
Alkaline Phosphatase: 91 U/L (ref 38–126)
Anion gap: 9 (ref 5–15)
BUN: 30 mg/dL — ABNORMAL HIGH (ref 8–23)
CO2: 28 mmol/L (ref 22–32)
Calcium: 9 mg/dL (ref 8.9–10.3)
Chloride: 100 mmol/L (ref 98–111)
Creatinine, Ser: 1.68 mg/dL — ABNORMAL HIGH (ref 0.44–1.00)
GFR, Estimated: 32 mL/min — ABNORMAL LOW (ref >60)
Glucose, Bld: 221 mg/dL — ABNORMAL HIGH (ref 70–99)
Potassium: 4.4 mmol/L (ref 3.5–5.1)
Sodium: 137 mmol/L (ref 135–145)
Total Bilirubin: 0.6 mg/dL (ref 0.0–1.2)
Total Protein: 6.2 g/dL — ABNORMAL LOW (ref 6.5–8.1)

## 2023-09-25 LAB — CBC WITH DIFFERENTIAL/PLATELET
Abs Immature Granulocytes: 0 10*3/uL (ref 0.00–0.07)
Basophils Absolute: 0 10*3/uL (ref 0.0–0.1)
Basophils Relative: 1 %
Eosinophils Absolute: 0.2 10*3/uL (ref 0.0–0.5)
Eosinophils Relative: 4 %
HCT: 32 % — ABNORMAL LOW (ref 36.0–46.0)
Hemoglobin: 10.1 g/dL — ABNORMAL LOW (ref 12.0–15.0)
Immature Granulocytes: 0 %
Lymphocytes Relative: 15 %
Lymphs Abs: 0.6 10*3/uL — ABNORMAL LOW (ref 0.7–4.0)
MCH: 31.2 pg (ref 26.0–34.0)
MCHC: 31.6 g/dL (ref 30.0–36.0)
MCV: 98.8 fL (ref 80.0–100.0)
Monocytes Absolute: 0.4 10*3/uL (ref 0.1–1.0)
Monocytes Relative: 10 %
Neutro Abs: 2.7 10*3/uL (ref 1.7–7.7)
Neutrophils Relative %: 70 %
Platelets: 122 10*3/uL — ABNORMAL LOW (ref 150–400)
RBC: 3.24 MIL/uL — ABNORMAL LOW (ref 3.87–5.11)
RDW: 12.7 % (ref 11.5–15.5)
WBC: 3.8 10*3/uL — ABNORMAL LOW (ref 4.0–10.5)
nRBC: 0 % (ref 0.0–0.2)

## 2023-09-25 LAB — MAGNESIUM: Magnesium: 2 mg/dL (ref 1.7–2.4)

## 2023-09-25 MED ORDER — HEPARIN SOD (PORK) LOCK FLUSH 100 UNIT/ML IV SOLN
500.0000 [IU] | Freq: Once | INTRAVENOUS | Status: AC
  Administered 2023-09-25: 500 [IU] via INTRAVENOUS

## 2023-09-25 MED ORDER — SODIUM CHLORIDE 0.9% FLUSH
10.0000 mL | INTRAVENOUS | Status: DC | PRN
  Administered 2023-09-25: 10 mL via INTRAVENOUS

## 2023-09-25 MED ORDER — BORTEZOMIB CHEMO SQ INJECTION 3.5 MG (2.5MG/ML)
1.0000 mg/m2 | Freq: Once | INTRAMUSCULAR | Status: AC
  Administered 2023-09-25: 2 mg via SUBCUTANEOUS
  Filled 2023-09-25: qty 0.8

## 2023-09-25 NOTE — Patient Instructions (Signed)
 [facility] [place] - A DEPT OF [facility]  Discharge Instructions: Thank you for choosing [facility] to provide your oncology and hematology care.  If you have a lab appointment with the Cancer Center - please note that after April 8th, 2024, all labs will be drawn in the cancer center.  You do not have to check in or register with the main entrance as you have in the past but will complete your check-in in the cancer center.  Wear comfortable clothing and clothing appropriate for easy access to any Portacath or PICC line.   We strive to give you quality time with your provider. You may need to reschedule your appointment if you arrive late (15 or more minutes).  Arriving late affects you and other patients whose appointments are after yours.  Also, if you miss three or more appointments without notifying the office, you may be dismissed from the clinic at the provider's discretion.      For prescription refill requests, have your pharmacy contact our office and allow 72 hours for refills to be completed.    Today you received the following chemotherapy and/or immunotherapy agents Velcade   To help prevent nausea and vomiting after your treatment, we encourage you to take your nausea medication as directed.  Bortezomib Injection What is this medication? BORTEZOMIB (bor TEZ oh mib) treats lymphoma. It may also be used to treat multiple myeloma, a type of bone marrow cancer. It works by blocking a protein that causes cancer cells to grow and multiply. This helps to slow or stop the spread of cancer cells. This medicine may be used for other purposes; ask your health care provider or pharmacist if you have questions. COMMON BRAND NAME(S): BORUZU, Velcade What should I tell my care team before I take this medication? They need to know if you have any of these conditions: Dehydration Diabetes Heart disease Liver disease Tingling of the fingers or toes or  other nerve disorder An unusual or allergic reaction to bortezomib, other medications, foods, dyes, or preservatives If you or your partner are pregnant or trying to get pregnant Breastfeeding How should I use this medication? This medication is injected into a vein or under the skin. It is given by your care team in a hospital or clinic setting. Talk to your care team about the use of this medication in children. Special care may be needed. Overdosage: If you think you have taken too much of this medicine contact a poison control center or emergency room at once. NOTE: This medicine is only for you. Do not share this medicine with others. What if I miss a dose? Keep appointments for follow-up doses. It is important not to miss your dose. Call your care team if you are unable to keep an appointment. What may interact with this medication? Ketoconazole Rifampin This list may not describe all possible interactions. Give your health care provider a list of all the medicines, herbs, non-prescription drugs, or dietary supplements you use. Also tell them if you smoke, drink alcohol, or use illegal drugs. Some items may interact with your medicine. What should I watch for while using this medication? Your condition will be monitored carefully while you are receiving this medication. You may need blood work while taking this medication. This medication may affect your coordination, reaction time, or judgment. Do not drive or operate machinery until you know how this medication affects you. Sit up or stand slowly to  reduce the risk of dizzy or fainting spells. Drinking alcohol with this medication can increase the risk of these side effects. This medication may increase your risk of getting an infection. Call your care team for advice if you get a fever, chills, sore throat, or other symptoms of a cold or flu. Do not treat yourself. Try to avoid being around people who are sick. Check with your care team  if you have severe diarrhea, nausea, and vomiting, or if you sweat a lot. The loss of too much body fluid may make it dangerous for you to take this medication. Talk to your care team if you may be pregnant. Serious birth defects can occur if you take this medication during pregnancy and for 7 months after the last dose. You will need a negative pregnancy test before starting this medication. Contraception is recommended while taking this medication and for 7 months after the last dose. Your care team can help you find the option that works for you. If your partner can get pregnant, use a condom during sex while taking this medication and for 4 months after the last dose. Do not breastfeed while taking this medication and for 2 months after the last dose. This medication may cause infertility. Talk to your care team if you are concerned about your fertility. What side effects may I notice from receiving this medication? Side effects that you should report to your care team as soon as possible: Allergic reactions--skin rash, itching, hives, swelling of the face, lips, tongue, or throat Bleeding--bloody or black, tar-like stools, vomiting blood or brown material that looks like coffee grounds, red or dark brown urine, small red or purple spots on skin, unusual bruising or bleeding Bleeding in the brain--severe headache, stiff neck, confusion, dizziness, change in vision, numbness or weakness of the face, arm, or leg, trouble speaking, trouble walking, vomiting Bowel blockage--stomach cramping, unable to have a bowel movement or pass gas, loss of appetite, vomiting Heart failure--shortness of breath, swelling of the ankles, feet, or hands, sudden weight gain, unusual weakness or fatigue Infection--fever, chills, cough, sore throat, wounds that don't heal, pain or trouble when passing urine, general feeling of discomfort or being unwell Liver injury--right upper belly pain, loss of appetite, nausea,  light-colored stool, dark yellow or brown urine, yellowing skin or eyes, unusual weakness or fatigue Low blood pressure--dizziness, feeling faint or lightheaded, blurry vision Lung injury--shortness of breath or trouble breathing, cough, spitting up blood, chest pain, fever Pain, tingling, or numbness in the hands or feet Severe or prolonged diarrhea Stomach pain, bloody diarrhea, pale skin, unusual weakness or fatigue, decrease in the amount of urine, which may be signs of hemolytic uremic syndrome Sudden and severe headache, confusion, change in vision, seizures, which may be signs of posterior reversible encephalopathy syndrome (PRES) TTP--purple spots on the skin or inside the mouth, pale skin, yellowing skin or eyes, unusual weakness or fatigue, fever, fast or irregular heartbeat, confusion, change in vision, trouble speaking, trouble walking Tumor lysis syndrome (TLS)--nausea, vomiting, diarrhea, decrease in the amount of urine, dark urine, unusual weakness or fatigue, confusion, muscle pain or cramps, fast or irregular heartbeat, joint pain Side effects that usually do not require medical attention (report to your care team if they continue or are bothersome): Constipation Diarrhea Fatigue Loss of appetite Nausea This list may not describe all possible side effects. Call your doctor for medical advice about side effects. You may report side effects to FDA at 1-800-FDA-1088. Where should I keep  my medication? This medication is given in a hospital or clinic. It will not be stored at home. NOTE: This sheet is a summary. It may not cover all possible information. If you have questions about this medicine, talk to your doctor, pharmacist, or health care provider.  2024 Elsevier/Gold Standard (2022-01-09 00:00:00)  BELOW ARE SYMPTOMS THAT SHOULD BE REPORTED IMMEDIATELY: *FEVER GREATER THAN 100.4 F (38 C) OR HIGHER *CHILLS OR SWEATING *NAUSEA AND VOMITING THAT IS NOT CONTROLLED WITH YOUR  NAUSEA MEDICATION *UNUSUAL SHORTNESS OF BREATH *UNUSUAL BRUISING OR BLEEDING *URINARY PROBLEMS (pain or burning when urinating, or frequent urination) *BOWEL PROBLEMS (unusual diarrhea, constipation, pain near the anus) TENDERNESS IN MOUTH AND THROAT WITH OR WITHOUT PRESENCE OF ULCERS (sore throat, sores in mouth, or a toothache) UNUSUAL RASH, SWELLING OR PAIN  UNUSUAL VAGINAL DISCHARGE OR ITCHING   Items with * indicate a potential emergency and should be followed up as soon as possible or go to the Emergency Department if any problems should occur.  Please show the CHEMOTHERAPY ALERT CARD or IMMUNOTHERAPY ALERT CARD at check-in to the Emergency Department and triage nurse.  Should you have questions after your visit or need to cancel or reschedule your appointment, please contact [facility] [place] - A DEPT OF [facility] [phone]  and follow the prompts.  Office hours are 8:00 a.m. to 4:30 p.m. Monday - Friday. Please note that voicemails left after 4:00 p.m. may not be returned until the following business day.  We are closed weekends and major holidays. You have access to a nurse at all times for urgent questions. Please call the main number to the clinic [phone] and follow the prompts.  For any non-urgent questions, you may also contact your provider using MyChart. We now offer e-Visits for anyone 40 and older to request care online for non-urgent symptoms. For details visit [email or web address].   Also download the MyChart app! Go to the app store, search "MyChart", open the app, select [place], and log in with your MyChart username and password.

## 2023-09-25 NOTE — Progress Notes (Signed)
 Patient presents today for chemotherapy infusion.  Patient is in satisfactory condition with no new complaints voiced.  Vital signs are stable.  Labs reviewed and all other labs are within treatment parameters. Patient's Creatinine noted to be 1.68.  We will proceed with treatment per MD orders.    Patient's Creatinine will be changed to greater than 2 in the treatment plan per Dr.K.  Treatment given today per MD orders. Tolerated infusion without adverse affects. Vital signs stable. No complaints at this time. Discharged from clinic ambulatory in stable condition. Alert and oriented x 3. F/U with [facility] as scheduled.

## 2023-09-25 NOTE — Progress Notes (Signed)
 Patients port flushed without difficulty.  Good blood return noted with no bruising or swelling noted at site.  Band aid applied.  VSS with discharge and left in satisfactory condition with no s/s of distress noted.

## 2023-09-26 ENCOUNTER — Other Ambulatory Visit: Payer: Self-pay

## 2023-09-30 ENCOUNTER — Other Ambulatory Visit: Payer: Self-pay | Admitting: Hematology

## 2023-10-04 ENCOUNTER — Encounter: Payer: 59 | Admitting: Neurology

## 2023-10-07 ENCOUNTER — Ambulatory Visit (INDEPENDENT_AMBULATORY_CARE_PROVIDER_SITE_OTHER): Payer: 59 | Admitting: Gastroenterology

## 2023-10-07 ENCOUNTER — Encounter: Payer: Self-pay | Admitting: Gastroenterology

## 2023-10-07 VITALS — BP 152/77 | HR 65 | Temp 98.3°F | Ht 66.0 in | Wt 194.8 lb

## 2023-10-07 DIAGNOSIS — R1011 Right upper quadrant pain: Secondary | ICD-10-CM | POA: Diagnosis not present

## 2023-10-07 DIAGNOSIS — G8929 Other chronic pain: Secondary | ICD-10-CM

## 2023-10-07 NOTE — Patient Instructions (Signed)
We will be in touch once I have reviewed her studies with Dr. Marletta Lor. Hopefully have you an answer for which direction we are moving in before Wednesday.

## 2023-10-07 NOTE — Progress Notes (Signed)
GI Office Note    Referring Provider: Alvina Filbert, MD Primary Care Physician:  Alvina Filbert, MD  Primary Gastroenterologist: Hennie Duos. Marletta Lor, DO    Chief Complaint   Chief Complaint  Patient presents with   Abdominal Pain    RLQ pain that has been going on for about six months    History of Present Illness   Alexandra Price is a 78 y.o. female presenting today for further evaluation of RUQ pain. Last seen in 2023. Was having RUQ pain at that time. Remote bilateral rib fractures. XRT to chest in 2022. Work up with abdominal u/s showing fatty liver 04/2022. EGD/colonoscopy as outlined below.   Today: Chronic RUQ/right flank pain, hurts most of the time. Sometimes laying down or heating pad helps. Achy/burning type pain. No n/v. No association with meals or BMs. Worse with twisting motion, as when wiping after BMs. BM postprandial, 2-3 times per day. Sometimes constipation, once every two weeks. No melena, brbpr. Some heartburn. Takes hydrocodone for back pain. Does not seem to help RUQ pain. Sometimes has to take tylenol.    CT thoracic/lumbar 07/2022: IMPRESSION: 1. New superior endplate compression fracture of the T2 vertebral body with approximately 30% vertebral body height loss. 2. Chronic superior endplate compression fractures of T11 and L4, unchanged. 3. Heterogeneous appearance of the osseous structures with numerous scattered areas of lucency compatible with known multiple myeloma. 4. Hepatic steatosis. 5. Aortic atherosclerosis (ICD10-I70.0).  CT A/P with contrast 07/2022 IMPRESSION: -No acute findings within the abdomen or pelvis. -Moderate hepatic steatosis. -Diffuse permeative osteopenia, consistent with patient's known history of multiple myeloma. -No acute fractures identified. Stable old compression deformities of T11 and L4 vertebral bodies, and old left posterior rib fractures.  CT cervical spine 08/2022:  1. No acute fracture or traumatic  subluxation. 2. Multilevel degenerate disc disease and facet joint arthropathy. 3. Multiple lucencies of the cervical vertebral bodies consistent with history of myeloma  Abd u/s 08/2022: IMPRESSION: 1. No acute abdominal process identified. 2. Hepatic steatosis. 3. Mildly increased renal cortical echogenicity, correlate for medical renal disease.  CT A/P without contrast 08/2022: IMPRESSION: -Hepatic steatosis. -No acute abnormality seen in the abdomen or pelvis. -Stable diffuse permeative osteopenia throughout the visualized skeleton consistent with history of multiple myeloma. -Aortic Atherosclerosis (ICD10-I70.0).  MR lumbar/thoracic 09/2022 IMPRESSION: 1. Small contrast-enhancing lesions in the posterior elements of T11 and possibly along the superior endplate of C7 are favored to represent myelomatous lesions. 2. Punctate contrast-enhancing lesion along the inferior endplate of L1 could be degenerative or represent a small myelomatous lesion. 3. Multilevel degenerative changes in the thoracolumbar spine without evidence of high grade spinal canal or neuroforaminal stenosis.     EGD 05/2022: -small hiatal hernia -gastritis s/p bx no h.pylori -consider MBSS as dysphagia appears more oropharygngeal,   Colonoscopy 05/2022 -nonbleeding internal hemorrhoids -diveritulcosis  -one 11 mm polyp sigmoid colon, tubulovillous -one 5mm polyp desceindg colon, tubular adenom -colonoscopy 05/2025     Medications   Current Outpatient Medications  Medication Sig Dispense Refill   acetaminophen (TYLENOL) 325 MG tablet Take 650 mg by mouth as needed for moderate pain (prior to infusion).     acyclovir (ZOVIRAX) 400 MG tablet Take 1 tablet (400 mg total) by mouth 2 (two) times daily. 60 tablet 6   ALPRAZolam (XANAX) 0.25 MG tablet Take one tablet (0.25 mg) by mouth in the morning as needed, and two tablets (0.5 mg) at bedtime as needed 90 tablet 2  Calcium Carb-Cholecalciferol  (CALCIUM 1000 + D PO) Take 1,000 mg by mouth daily.     dexAMETHasone 20 MG TABS Take 1 tablet by mouth as directed. Take 1 tablet 30 minutes prior to infusions (Patient taking differently: Take 1 tablet by mouth as directed. Take 1 tablet 30 minutes prior to the Darzelex injections-once a month) 30 tablet 2   diphenhydrAMINE (BENADRYL) 25 MG tablet Take 50 mg by mouth as needed (prior to infusion).     ELIQUIS 2.5 MG TABS tablet Take 2.5 mg by mouth 2 (two) times daily.     escitalopram (LEXAPRO) 20 MG tablet Take 1 tablet by mouth once daily 30 tablet 0   furosemide (LASIX) 20 MG tablet Take 1 tablet (20 mg total) by mouth as needed (in am as needed for swelling). 30 tablet 2   gabapentin (NEURONTIN) 300 MG capsule Take 1 capsule (300 mg total) by mouth 2 (two) times daily. 60 capsule 0   glipiZIDE (GLUCOTROL) 5 MG tablet TAKE 1 TABLET BY MOUTH ONCE DAILY BEFORE BREAKFAST 90 tablet 0   HYDROcodone-acetaminophen (NORCO) 10-325 MG tablet Take 1 tablet by mouth every 6 (six) hours as needed. 120 tablet 0   magnesium oxide (MAG-OX) 400 (240 Mg) MG tablet Take 1 tablet by mouth 2 (two) times daily.     midodrine (PROAMATINE) 2.5 MG tablet Take 1 tablet (2.5 mg total) by mouth 3 (three) times daily with meals. 90 tablet 1   Multiple Vitamin (MULTI VITAMIN) TABS Take 1 tablet by mouth daily.     pantoprazole (PROTONIX) 40 MG tablet Take 1 tablet by mouth once daily 90 tablet 0   polyethylene glycol (MIRALAX / GLYCOLAX) 17 g packet Take 17 g by mouth daily as needed for moderate constipation. 14 each 0   prochlorperazine (COMPAZINE) 10 MG tablet Take 1 tablet (10 mg total) by mouth every 6 (six) hours as needed for nausea or vomiting. 30 tablet 0   rosuvastatin (CRESTOR) 10 MG tablet Take 10 mg by mouth daily.     zolpidem (AMBIEN) 10 MG tablet Take 1 tablet (10 mg total) by mouth at bedtime as needed. 30 tablet 5   No current facility-administered medications for this visit.    Allergies    Allergies as of 10/07/2023 - Review Complete 10/07/2023  Allergen Reaction Noted   Ciprofloxacin Anaphylaxis 06/04/2018   Amoxicillin Other (See Comments) 04/07/2019   Morphine and codeine Nausea And Vomiting 04/10/2018   Augmentin [amoxicillin-pot clavulanate] Other (See Comments)        General: Negative for anorexia, weight loss, fever, chills, fatigue, weakness. ENT: Negative for hoarseness, difficulty swallowing , nasal congestion. CV: Negative for chest pain, angina, palpitations, dyspnea on exertion, peripheral edema.  Respiratory: Negative for dyspnea at rest, dyspnea on exertion, cough, sputum, wheezing.  GI: See history of present illness. GU:  Negative for dysuria, hematuria, urinary incontinence, urinary frequency, nocturnal urination.  Endo: Negative for unusual weight change.     Physical Exam   BP (!) 152/77 (BP Location: Right Arm, Patient Position: Sitting, Cuff Size: Large)   Pulse 65   Temp 98.3 F (36.8 C) (Oral)   Ht 5\' 6"  (1.676 m)   Wt 194 lb 12.8 oz (88.4 kg)   SpO2 96%   BMI 31.44 kg/m    General: Well-nourished, well-developed in no acute distress. Mild to moderate kyphosis Eyes: No icterus. Mouth: Oropharyngeal mucosa moist and pink   Lungs: Clear to auscultation bilaterally.  Heart: Regular rate and rhythm, no murmurs  rubs or gallops.  Abdomen: Bowel sounds are normal, nontender, nondistended, no hepatosplenomegaly or masses,  no abdominal bruits or hernia , no rebound or guarding. No cva tenderness. She points to right flank at level of one inch above the umbilicus.  Rectal: not performed  Extremities: No lower extremity edema. No clubbing or deformities. Neuro: Alert and oriented x 4   Skin: Warm and dry, no jaundice.   Psych: Alert and cooperative, normal mood and affect.  Labs   Lab Results  Component Value Date   NA 137 09/25/2023   CL 100 09/25/2023   K 4.4 09/25/2023   CO2 28 09/25/2023   BUN 30 (H) 09/25/2023   CREATININE 1.68  (H) 09/25/2023   GFRNONAA 32 (L) 09/25/2023   CALCIUM 9.0 09/25/2023   PHOS 2.8 08/17/2022   ALBUMIN 4.0 09/25/2023   GLUCOSE 221 (H) 09/25/2023   Lab Results  Component Value Date   ALT 34 09/25/2023   AST 26 09/25/2023   ALKPHOS 91 09/25/2023   BILITOT 0.6 09/25/2023   Lab Results  Component Value Date   WBC 3.8 (L) 09/25/2023   HGB 10.1 (L) 09/25/2023   HCT 32.0 (L) 09/25/2023   MCV 98.8 09/25/2023   PLT 122 (L) 09/25/2023    Imaging Studies   No results found.  Assessment   Chronic right flank pain in area of right flank/right posterior ribs: -chronic, constant pain, worse with movements such as twisting -not related to meals or BMs -suspicious of musculoskeletal cause, she has received radiation to the chest and T11, known bilateral rib fractures and kyphosis. Multilevel disc disease involving thoracic region with small disc bulges at T10-T11 and T12-L1 with mild spinal stenosis.  -doubt repeat CT imaging of abd would be helpful -EGD/colonoscopy fairly recent as outlined above -Gallbladder remains in situ but symptoms not typical of bilary disease.  -to discuss case with Dr. Marletta Lor, not clear that anything else can be offered from a GI standpoint.    Leanna Battles. Melvyn Neth, MHS, [name] [facility]

## 2023-10-08 ENCOUNTER — Other Ambulatory Visit: Payer: Self-pay

## 2023-10-09 ENCOUNTER — Inpatient Hospital Stay: Payer: 59

## 2023-10-09 ENCOUNTER — Inpatient Hospital Stay: Payer: 59 | Admitting: Hematology

## 2023-10-09 ENCOUNTER — Encounter: Payer: Self-pay | Admitting: Hematology

## 2023-10-09 ENCOUNTER — Other Ambulatory Visit: Payer: Self-pay | Admitting: Hematology

## 2023-10-09 VITALS — BP 153/91 | HR 74 | Temp 98.0°F | Resp 20 | Wt 194.0 lb

## 2023-10-09 DIAGNOSIS — Z95828 Presence of other vascular implants and grafts: Secondary | ICD-10-CM

## 2023-10-09 DIAGNOSIS — Z5112 Encounter for antineoplastic immunotherapy: Secondary | ICD-10-CM | POA: Diagnosis not present

## 2023-10-09 DIAGNOSIS — C9000 Multiple myeloma not having achieved remission: Secondary | ICD-10-CM

## 2023-10-09 LAB — CBC WITH DIFFERENTIAL/PLATELET
Abs Immature Granulocytes: 0.01 10*3/uL (ref 0.00–0.07)
Basophils Absolute: 0 10*3/uL (ref 0.0–0.1)
Basophils Relative: 1 %
Eosinophils Absolute: 0.2 10*3/uL (ref 0.0–0.5)
Eosinophils Relative: 8 %
HCT: 32.2 % — ABNORMAL LOW (ref 36.0–46.0)
Hemoglobin: 10.2 g/dL — ABNORMAL LOW (ref 12.0–15.0)
Immature Granulocytes: 0 %
Lymphocytes Relative: 15 %
Lymphs Abs: 0.4 10*3/uL — ABNORMAL LOW (ref 0.7–4.0)
MCH: 31.8 pg (ref 26.0–34.0)
MCHC: 31.7 g/dL (ref 30.0–36.0)
MCV: 100.3 fL — ABNORMAL HIGH (ref 80.0–100.0)
Monocytes Absolute: 0.3 10*3/uL (ref 0.1–1.0)
Monocytes Relative: 9 %
Neutro Abs: 2 10*3/uL (ref 1.7–7.7)
Neutrophils Relative %: 67 %
Platelets: 135 10*3/uL — ABNORMAL LOW (ref 150–400)
RBC: 3.21 MIL/uL — ABNORMAL LOW (ref 3.87–5.11)
RDW: 12.8 % (ref 11.5–15.5)
WBC: 3 10*3/uL — ABNORMAL LOW (ref 4.0–10.5)
nRBC: 0 % (ref 0.0–0.2)

## 2023-10-09 LAB — COMPREHENSIVE METABOLIC PANEL
ALT: 32 U/L (ref 0–44)
AST: 26 U/L (ref 15–41)
Albumin: 4 g/dL (ref 3.5–5.0)
Alkaline Phosphatase: 87 U/L (ref 38–126)
Anion gap: 11 (ref 5–15)
BUN: 21 mg/dL (ref 8–23)
CO2: 27 mmol/L (ref 22–32)
Calcium: 9.1 mg/dL (ref 8.9–10.3)
Chloride: 101 mmol/L (ref 98–111)
Creatinine, Ser: 1.49 mg/dL — ABNORMAL HIGH (ref 0.44–1.00)
GFR, Estimated: 36 mL/min — ABNORMAL LOW (ref >60)
Glucose, Bld: 186 mg/dL — ABNORMAL HIGH (ref 70–99)
Potassium: 4.5 mmol/L (ref 3.5–5.1)
Sodium: 139 mmol/L (ref 135–145)
Total Bilirubin: 0.5 mg/dL (ref 0.0–1.2)
Total Protein: 6.5 g/dL (ref 6.5–8.1)

## 2023-10-09 LAB — MAGNESIUM: Magnesium: 2 mg/dL (ref 1.7–2.4)

## 2023-10-09 MED ORDER — SODIUM CHLORIDE FLUSH 0.9 % IV SOLN
10.0000 mL | Freq: Once | INTRAVENOUS | Status: AC
  Administered 2023-10-09: 10 mL via INTRAVENOUS
  Filled 2023-10-09: qty 10

## 2023-10-09 MED ORDER — HEPARIN SOD (PORK) LOCK FLUSH 100 UNIT/ML IV SOLN
500.0000 [IU] | Freq: Once | INTRAVENOUS | Status: AC
  Administered 2023-10-09: 500 [IU] via INTRAVENOUS

## 2023-10-09 MED ORDER — BORTEZOMIB CHEMO SQ INJECTION 3.5 MG (2.5MG/ML)
1.0000 mg/m2 | Freq: Once | INTRAMUSCULAR | Status: AC
  Administered 2023-10-09: 2 mg via SUBCUTANEOUS
  Filled 2023-10-09: qty 0.8

## 2023-10-09 MED ORDER — DARATUMUMAB-HYALURONIDASE-FIHJ 1800-30000 MG-UT/15ML ~~LOC~~ SOLN
1800.0000 mg | Freq: Once | SUBCUTANEOUS | Status: AC
  Administered 2023-10-09: 1800 mg via SUBCUTANEOUS
  Filled 2023-10-09: qty 15

## 2023-10-09 NOTE — Patient Instructions (Signed)
[facility] [place] - A DEPT OF [facility]  Discharge Instructions: Thank you for choosing [facility] to provide your oncology and hematology care.  If you have a lab appointment with the Cancer Center - please note that after April 8th, 2024, all labs will be drawn in the cancer center.  You do not have to check in or register with the main entrance as you have in the past but will complete your check-in in the cancer center.  Wear comfortable clothing and clothing appropriate for easy access to any Portacath or PICC line.   We strive to give you quality time with your provider. You may need to reschedule your appointment if you arrive late (15 or more minutes).  Arriving late affects you and other patients whose appointments are after yours.  Also, if you miss three or more appointments without notifying the office, you may be dismissed from the clinic at the provider's discretion.      For prescription refill requests, have your pharmacy contact our office and allow 72 hours for refills to be completed.    Today you received the following chemotherapy and/or immunotherapy agents Daratumumab/Velcade.  Daratumumab; Hyaluronidase Injection What is this medication? DARATUMUMAB; HYALURONIDASE (dar a toom ue mab; hye al ur ON i dase) treats multiple myeloma, a type of bone marrow cancer. Daratumumab works by blocking a protein that causes cancer cells to grow and multiply. This helps to slow or stop the spread of cancer cells. Hyaluronidase works by increasing the absorption of other medications in the body to help them work better. This medication may also be used treat amyloidosis, a condition that causes the buildup of a protein (amyloid) in your body. It works by reducing the buildup of this protein, which decreases symptoms. It is a combination medication that contains a monoclonal antibody. This medicine may be used for other purposes; ask your health care  provider or pharmacist if you have questions. COMMON BRAND NAME(S): DARZALEX FASPRO What should I tell my care team before I take this medication? They need to know if you have any of these conditions: Heart disease Infection, such as chickenpox, cold sores, herpes, hepatitis B Lung or breathing disease An unusual or allergic reaction to daratumumab, hyaluronidase, other medications, foods, dyes, or preservatives Pregnant or trying to get pregnant Breast-feeding How should I use this medication? This medication is injected under the skin. It is given by your care team in a hospital or clinic setting. Talk to your care team about the use of this medication in children. Special care may be needed. Overdosage: If you think you have taken too much of this medicine contact a poison control center or emergency room at once. NOTE: This medicine is only for you. Do not share this medicine with others. What if I miss a dose? Keep appointments for follow-up doses. It is important not to miss your dose. Call your care team if you are unable to keep an appointment. What may interact with this medication? Interactions have not been studied. This list may not describe all possible interactions. Give your health care provider a list of all the medicines, herbs, non-prescription drugs, or dietary supplements you use. Also tell them if you smoke, drink alcohol, or use illegal drugs. Some items may interact with your medicine. What should I watch for while using this medication? Your condition will be monitored carefully while you are receiving this medication. This medication can cause serious allergic  reactions. To reduce your risk, your care team may give you other medication to take before receiving this one. Be sure to follow the directions from your care team. This medication can affect the results of blood tests to match your blood type. These changes can last for up to 6 months after the final dose.  Your care team will do blood tests to match your blood type before you start treatment. Tell all of your care team that you are being treated with this medication before receiving a blood transfusion. This medication can affect the results of some tests used to determine treatment response; extra tests may be needed to evaluate response. Talk to your care team if you wish to become pregnant or think you are pregnant. This medication can cause serious birth defects if taken during pregnancy and for 3 months after the last dose. A reliable form of contraception is recommended while taking this medication and for 3 months after the last dose. Talk to your care team about effective forms of contraception. Do not breast-feed while taking this medication. What side effects may I notice from receiving this medication? Side effects that you should report to your care team as soon as possible: Allergic reactions--skin rash, itching, hives, swelling of the face, lips, tongue, or throat Heart rhythm changes--fast or irregular heartbeat, dizziness, feeling faint or lightheaded, chest pain, trouble breathing Infection--fever, chills, cough, sore throat, wounds that don't heal, pain or trouble when passing urine, general feeling of discomfort or being unwell Infusion reactions--chest pain, shortness of breath or trouble breathing, feeling faint or lightheaded Sudden eye pain or change in vision such as blurry vision, seeing halos around lights, vision loss Unusual bruising or bleeding Side effects that usually do not require medical attention (report to your care team if they continue or are bothersome): Constipation Diarrhea Fatigue Nausea Pain, tingling, or numbness in the hands or feet Swelling of the ankles, hands, or feet This list may not describe all possible side effects. Call your doctor for medical advice about side effects. You may report side effects to FDA at 1-800-FDA-1088. Where should I keep my  medication? This medication is given in a hospital or clinic. It will not be stored at home. NOTE: This sheet is a summary. It may not cover all possible information. If you have questions about this medicine, talk to your doctor, pharmacist, or health care provider.  2024 Elsevier/Gold Standard (2021-12-12 00:00:00)   Bortezomib Injection What is this medication? BORTEZOMIB (bor TEZ oh mib) treats lymphoma. It may also be used to treat multiple myeloma, a type of bone marrow cancer. It works by blocking a protein that causes cancer cells to grow and multiply. This helps to slow or stop the spread of cancer cells. This medicine may be used for other purposes; ask your health care provider or pharmacist if you have questions. COMMON BRAND NAME(S): BORUZU, Velcade What should I tell my care team before I take this medication? They need to know if you have any of these conditions: Dehydration Diabetes Heart disease Liver disease Tingling of the fingers or toes or other nerve disorder An unusual or allergic reaction to bortezomib, other medications, foods, dyes, or preservatives If you or your partner are pregnant or trying to get pregnant Breastfeeding How should I use this medication? This medication is injected into a vein or under the skin. It is given by your care team in a hospital or clinic setting. Talk to your care team about the use  of this medication in children. Special care may be needed. Overdosage: If you think you have taken too much of this medicine contact a poison control center or emergency room at once. NOTE: This medicine is only for you. Do not share this medicine with others. What if I miss a dose? Keep appointments for follow-up doses. It is important not to miss your dose. Call your care team if you are unable to keep an appointment. What may interact with this medication? Ketoconazole Rifampin This list may not describe all possible interactions. Give your  health care provider a list of all the medicines, herbs, non-prescription drugs, or dietary supplements you use. Also tell them if you smoke, drink alcohol, or use illegal drugs. Some items may interact with your medicine. What should I watch for while using this medication? Your condition will be monitored carefully while you are receiving this medication. You may need blood work while taking this medication. This medication may affect your coordination, reaction time, or judgment. Do not drive or operate machinery until you know how this medication affects you. Sit up or stand slowly to reduce the risk of dizzy or fainting spells. Drinking alcohol with this medication can increase the risk of these side effects. This medication may increase your risk of getting an infection. Call your care team for advice if you get a fever, chills, sore throat, or other symptoms of a cold or flu. Do not treat yourself. Try to avoid being around people who are sick. Check with your care team if you have severe diarrhea, nausea, and vomiting, or if you sweat a lot. The loss of too much body fluid may make it dangerous for you to take this medication. Talk to your care team if you may be pregnant. Serious birth defects can occur if you take this medication during pregnancy and for 7 months after the last dose. You will need a negative pregnancy test before starting this medication. Contraception is recommended while taking this medication and for 7 months after the last dose. Your care team can help you find the option that works for you. If your partner can get pregnant, use a condom during sex while taking this medication and for 4 months after the last dose. Do not breastfeed while taking this medication and for 2 months after the last dose. This medication may cause infertility. Talk to your care team if you are concerned about your fertility. What side effects may I notice from receiving this medication? Side effects  that you should report to your care team as soon as possible: Allergic reactions--skin rash, itching, hives, swelling of the face, lips, tongue, or throat Bleeding--bloody or black, tar-like stools, vomiting blood or Lakeesha Fontanilla material that looks like coffee grounds, red or dark Celie Desrochers urine, small red or purple spots on skin, unusual bruising or bleeding Bleeding in the brain--severe headache, stiff neck, confusion, dizziness, change in vision, numbness or weakness of the face, arm, or leg, trouble speaking, trouble walking, vomiting Bowel blockage--stomach cramping, unable to have a bowel movement or pass gas, loss of appetite, vomiting Heart failure--shortness of breath, swelling of the ankles, feet, or hands, sudden weight gain, unusual weakness or fatigue Infection--fever, chills, cough, sore throat, wounds that don't heal, pain or trouble when passing urine, general feeling of discomfort or being unwell Liver injury--right upper belly pain, loss of appetite, nausea, light-colored stool, dark yellow or Nivea Wojdyla urine, yellowing skin or eyes, unusual weakness or fatigue Low blood pressure--dizziness, feeling faint or lightheaded,  blurry vision Lung injury--shortness of breath or trouble breathing, cough, spitting up blood, chest pain, fever Pain, tingling, or numbness in the hands or feet Severe or prolonged diarrhea Stomach pain, bloody diarrhea, pale skin, unusual weakness or fatigue, decrease in the amount of urine, which may be signs of hemolytic uremic syndrome Sudden and severe headache, confusion, change in vision, seizures, which may be signs of posterior reversible encephalopathy syndrome (PRES) TTP--purple spots on the skin or inside the mouth, pale skin, yellowing skin or eyes, unusual weakness or fatigue, fever, fast or irregular heartbeat, confusion, change in vision, trouble speaking, trouble walking Tumor lysis syndrome (TLS)--nausea, vomiting, diarrhea, decrease in the amount of urine,  dark urine, unusual weakness or fatigue, confusion, muscle pain or cramps, fast or irregular heartbeat, joint pain Side effects that usually do not require medical attention (report to your care team if they continue or are bothersome): Constipation Diarrhea Fatigue Loss of appetite Nausea This list may not describe all possible side effects. Call your doctor for medical advice about side effects. You may report side effects to FDA at 1-800-FDA-1088. Where should I keep my medication? This medication is given in a hospital or clinic. It will not be stored at home. NOTE: This sheet is a summary. It may not cover all possible information. If you have questions about this medicine, talk to your doctor, pharmacist, or health care provider.  2024 Elsevier/Gold Standard (2022-01-09 00:00:00)       To help prevent nausea and vomiting after your treatment, we encourage you to take your nausea medication as directed.  BELOW ARE SYMPTOMS THAT SHOULD BE REPORTED IMMEDIATELY: *FEVER GREATER THAN 100.4 F (38 C) OR HIGHER *CHILLS OR SWEATING *NAUSEA AND VOMITING THAT IS NOT CONTROLLED WITH YOUR NAUSEA MEDICATION *UNUSUAL SHORTNESS OF BREATH *UNUSUAL BRUISING OR BLEEDING *URINARY PROBLEMS (pain or burning when urinating, or frequent urination) *BOWEL PROBLEMS (unusual diarrhea, constipation, pain near the anus) TENDERNESS IN MOUTH AND THROAT WITH OR WITHOUT PRESENCE OF ULCERS (sore throat, sores in mouth, or a toothache) UNUSUAL RASH, SWELLING OR PAIN  UNUSUAL VAGINAL DISCHARGE OR ITCHING   Items with * indicate a potential emergency and should be followed up as soon as possible or go to the Emergency Department if any problems should occur.  Please show the CHEMOTHERAPY ALERT CARD or IMMUNOTHERAPY ALERT CARD at check-in to the Emergency Department and triage nurse.  Should you have questions after your visit or need to cancel or reschedule your appointment, please contact [facility] [place]  - A DEPT OF [facility] [phone]  and follow the prompts.  Office hours are 8:00 a.m. to 4:30 p.m. Monday - Friday. Please note that voicemails left after 4:00 p.m. may not be returned until the following business day.  We are closed weekends and major holidays. You have access to a nurse at all times for urgent questions. Please call the main number to the clinic [phone] and follow the prompts.  For any non-urgent questions, you may also contact your provider using MyChart. We now offer e-Visits for anyone 13 and older to request care online for non-urgent symptoms. For details visit [email or web address].   Also download the MyChart app! Go to the app store, search "MyChart", open the app, select [place], and log in with your MyChart username and password.

## 2023-10-09 NOTE — Progress Notes (Signed)
Patient presents today for Daratumuamb and Velcade injections.  Patient is in satisfactory condition with no new complaints voiced.  Vital signs are stable.  Labs reviewed and all labs are within treatment parameters.  Benadryl, Decadron, and Tylenol taken at 1000 prior to visit.  We will proceed with treatment per MD orders.    Patient tolerated injections with no complaints voiced.  Site clean and dry with no bruising or swelling noted.  No complaints of pain.  Discharged with vital signs stable and no signs or symptoms of distress noted.

## 2023-10-09 NOTE — Progress Notes (Signed)
 Port flushed with good blood return noted. No bruising or swelling at site. Bandaid applied and patient discharged in satisfactory condition. VVS stable with no signs or symptoms of distressed noted.

## 2023-10-11 ENCOUNTER — Other Ambulatory Visit: Payer: Self-pay

## 2023-10-11 DIAGNOSIS — C9000 Multiple myeloma not having achieved remission: Secondary | ICD-10-CM

## 2023-10-11 MED ORDER — HYDROCODONE-ACETAMINOPHEN 10-325 MG PO TABS
1.0000 | ORAL_TABLET | Freq: Four times a day (QID) | ORAL | 0 refills | Status: DC | PRN
Start: 2023-10-11 — End: 2023-11-06

## 2023-10-11 MED ORDER — GABAPENTIN 300 MG PO CAPS: 300.0000 mg | ORAL_CAPSULE | Freq: Two times a day (BID) | ORAL | 3 refills | Status: DC

## 2023-10-16 ENCOUNTER — Other Ambulatory Visit: Payer: Self-pay

## 2023-10-16 NOTE — Procedures (Signed)
   HISTORY: 78 year old female complains of intermittent confusion  TECHNIQUE:  This is a routine 16 channel EEG recording with one channel devoted to a limited EKG recording.  It was performed during wakefulness, drowsiness and asleep.  Hyperventilation and photic stimulation were performed as activating procedures.  There are minimum muscle and movement artifact noted.  Upon maximum arousal, posterior dominant waking rhythm consistent of rhythmic alpha range activity. Activities are symmetric over the bilateral posterior derivations and attenuated with eye opening.  Photic stimulation did not alter the tracing.  Hyperventilation produced mild/moderate buildup with higher amplitude and the slower activities noted.  During EEG recording, patient developed drowsiness and no deeper stage of sleep was achieved During EEG recording, there was no epileptiform discharge noted.  EKG demonstrate normal sinus rhythm.  CONCLUSION: This is a  normal awake EEG.  There is no electrodiagnostic evidence of epileptiform discharge.  Levert Feinstein, M.D. Ph.D.  [facility] [address] [place], [place] [address] Phone: [phone] Fax:      [phone]

## 2023-10-17 ENCOUNTER — Encounter: Payer: Self-pay | Admitting: Neurology

## 2023-10-18 ENCOUNTER — Ambulatory Visit: Payer: 59 | Admitting: Neurology

## 2023-10-18 ENCOUNTER — Ambulatory Visit (INDEPENDENT_AMBULATORY_CARE_PROVIDER_SITE_OTHER): Payer: 59 | Admitting: Neurology

## 2023-10-18 DIAGNOSIS — R202 Paresthesia of skin: Secondary | ICD-10-CM

## 2023-10-18 DIAGNOSIS — R4789 Other speech disturbances: Secondary | ICD-10-CM | POA: Diagnosis not present

## 2023-10-18 DIAGNOSIS — G629 Polyneuropathy, unspecified: Secondary | ICD-10-CM | POA: Diagnosis not present

## 2023-10-18 MED ORDER — GABAPENTIN 300 MG PO CAPS: 300.0000 mg | ORAL_CAPSULE | Freq: Three times a day (TID) | ORAL | 11 refills | Status: DC

## 2023-10-18 MED ORDER — DICLOFENAC SODIUM 1 % EX GEL: CUTANEOUS | 11 refills | Status: AC

## 2023-10-18 MED ORDER — LIDOCAINE-PRILOCAINE 2.5-2.5 % EX CREA: 1.0000 | TOPICAL_CREAM | CUTANEOUS | 11 refills | Status: AC | PRN

## 2023-10-18 NOTE — Progress Notes (Signed)
 No chief complaint on file.  ASSESSMENT AND PLAN  Alexandra Price is a 78 y.o. female   Intermittent confusion  It happened in the setting of acute infection,  most suggestive of acute metabolic encephalopathy with a background of impairment mild cognitive impairment at baseline, MoCA examination is 25/ 30 today,  MRI of the brain showed mild age-related changes no acute abnormality,  EEG, normal in January 2025 Peripheral neuropathy  EMG nerve conduction study confirmed moderate axonal sensorimotor polyneuropathy  Can be related to her chemotherapy  Laboratory evaluation to rule out treatable etiology,   Gabapentin 300 mg 3 times a day  EMLA gel may mixed together with diclofenac gel   DIAGNOSTIC DATA (LABS, IMAGING, TESTING) - I reviewed patient records, labs, notes, testing and imaging myself where available.   MEDICAL HISTORY:  Alexandra Price is a 78 year old female, seen in request by her oncologist Dr. Doreatha Massed, for evaluation of intermittent confusion, her primary care is Dr. Durene Cal, Angel[facility] Blonder, initial evaluation was on August 29, 2023  .   History is obtained from the patient and review of electronic medical records. I personally reviewed pertinent available imaging films in PACS.   PMHx of  Multiple myeloma in 2020, s/p bone marrow transplant, chemotherapy HLD DM History of DVT, PE.  She lives with her son at home, used to work as a Engineering geologist job, there was no family history of memory  Over the past couple years, she has intermittent confusion episode, most obvious 1 was in December 2023, she was sick with acute bronchitis, acute hypoxic respiratory failure, required hospitalization, oxygen treatment, she lost memory of the day when she was really ill,  She is functioning well in her daily activity, with occasionally word finding difficulties, She had a history of multiple myeloma, had a bone marrow transplant, chemotherapy, since chemotherapy  about 4 years ago she noticed numbness tingling of her lower extremity, which has been persistent with slight worsening  ECHO was normal in Jan 2024,   UPDATE Oct 18 2023: She return for electrodiagnostic study today which demonstrated moderate axonal sensorimotor polyneuropathy, can be related to her chemotherapy, complaints of lower extremity painful paresthesia,  She no longer has confusion spells,  PHYSICAL EXAM:      10/23/2023    7:58 AM 10/09/2023    9:50 AM 10/07/2023    3:36 PM  Vitals with BMI  Weight 191 lbs 6 oz 194 lbs   BMI 30.9 31.33   Systolic 137 153 161  Diastolic 75 91 77  Pulse 74 74 65    PHYSICAL EXAMNIATION:  Gen: NAD, conversant, well nourised, well groomed                     Cardiovascular: Regular rate rhythm, no peripheral edema, warm, nontender. Eyes: Conjunctivae clear without exudates or hemorrhage Neck: Supple, no carotid bruits. Pulmonary: Clear to auscultation bilaterally   NEUROLOGICAL EXAM:  MENTAL STATUS: Speech/cognition: Awake, alert, oriented to history taking and casual conversation    08/27/2023    2:00 PM  Montreal Cognitive Assessment   Visuospatial/ Executive (0/5) 5  Naming (0/3) 3  Attention: Read list of digits (0/2) 2  Attention: Read list of letters (0/1) 1  Attention: Serial 7 subtraction starting at 100 (0/3) 3  Language: Repeat phrase (0/2) 2  Language : Fluency (0/1) 1  Abstraction (0/2) 2  Delayed Recall (0/5) 1  Orientation (0/6) 5  Total 25    CRANIAL NERVES: CN  II: Visual fields are full to confrontation. Pupils are round equal and briskly reactive to light. CN III, IV, VI: extraocular movement are normal. No ptosis. CN V: Facial sensation is intact to light touch CN VII: Face is symmetric with normal eye closure  CN VIII: Hearing is normal to causal conversation. CN IX, X: Phonation is normal. CN XI: Head turning and shoulder shrug are intact  MOTOR: There is no pronator drift of out-stretched arms.  Muscle bulk and tone are normal. Muscle strength is normal.  REFLEXES: Reflexes are 1 and symmetric at the biceps, triceps, knees, and absent ankles. Plantar responses are flexor.  SENSORY: Mildly length-dependent decreased light touch, pinprick, vibratory sensation to ankle level  COORDINATION: There is no trunk or limb dysmetria noted.  GAIT/STANCE: Push-up to get up from seated position, cautious  REVIEW OF SYSTEMS:  Full 14 system review of systems performed and notable only for as above All other review of systems were negative.   ALLERGIES: Allergies  Allergen Reactions   Ciprofloxacin Anaphylaxis   Amoxicillin Other (See Comments)    unknown   Morphine And Codeine Nausea And Vomiting   Augmentin [Amoxicillin-Pot Clavulanate] Other (See Comments)    Headache, insomnia    HOME MEDICATIONS: Current Outpatient Medications  Medication Sig Dispense Refill   acetaminophen (TYLENOL) 325 MG tablet Take 650 mg by mouth as needed for moderate pain (prior to infusion).     acyclovir (ZOVIRAX) 400 MG tablet Take 1 tablet (400 mg total) by mouth 2 (two) times daily. 60 tablet 6   ALPRAZolam (XANAX) 0.25 MG tablet Take one tablet (0.25 mg) by mouth in the morning as needed, and two tablets (0.5 mg) at bedtime as needed 90 tablet 2   Calcium Carb-Cholecalciferol (CALCIUM 1000 + D PO) Take 1,000 mg by mouth daily.     dexAMETHasone 20 MG TABS Take 1 tablet by mouth as directed. Take 1 tablet 30 minutes prior to infusions (Patient taking differently: Take 1 tablet by mouth as directed. Take 1 tablet 30 minutes prior to the Darzelex injections-once a month) 30 tablet 2   diphenhydrAMINE (BENADRYL) 25 MG tablet Take 50 mg by mouth as needed (prior to infusion).     ELIQUIS 2.5 MG TABS tablet Take 2.5 mg by mouth 2 (two) times daily.     escitalopram (LEXAPRO) 20 MG tablet Take 1 tablet by mouth once daily 30 tablet 0   furosemide (LASIX) 20 MG tablet Take 1 tablet (20 mg total) by mouth as  needed (in am as needed for swelling). 30 tablet 2   gabapentin (NEURONTIN) 300 MG capsule Take 1 capsule (300 mg total) by mouth 2 (two) times daily. 60 capsule 3   glipiZIDE (GLUCOTROL) 5 MG tablet TAKE 1 TABLET BY MOUTH ONCE DAILY BEFORE BREAKFAST 90 tablet 0   HYDROcodone-acetaminophen (NORCO) 10-325 MG tablet Take 1 tablet by mouth every 6 (six) hours as needed. 120 tablet 0   magnesium oxide (MAG-OX) 400 (240 Mg) MG tablet Take 1 tablet by mouth 2 (two) times daily.     midodrine (PROAMATINE) 2.5 MG tablet Take 1 tablet (2.5 mg total) by mouth 3 (three) times daily with meals. 90 tablet 1   Multiple Vitamin (MULTI VITAMIN) TABS Take 1 tablet by mouth daily.     pantoprazole (PROTONIX) 40 MG tablet Take 1 tablet by mouth once daily 90 tablet 0   polyethylene glycol (MIRALAX / GLYCOLAX) 17 g packet Take 17 g by mouth daily as needed for moderate constipation.  14 each 0   prochlorperazine (COMPAZINE) 10 MG tablet Take 1 tablet (10 mg total) by mouth every 6 (six) hours as needed for nausea or vomiting. 30 tablet 0   rosuvastatin (CRESTOR) 10 MG tablet Take 10 mg by mouth daily.     zolpidem (AMBIEN) 10 MG tablet Take 1 tablet (10 mg total) by mouth at bedtime as needed. 30 tablet 5   No current facility-administered medications for this visit.    PAST MEDICAL HISTORY: Past Medical History:  Diagnosis Date   Anxiety    Chronic kidney disease    Depression    Hypertension    Multiple myeloma (HCC)    multiple myeloma   Pre-diabetes     PAST SURGICAL HISTORY: Past Surgical History:  Procedure Laterality Date   ABDOMINAL HYSTERECTOMY     total   APPENDECTOMY     BALLOON DILATION N/A 05/28/2022   Procedure: BALLOON DILATION;  Surgeon: Lanelle Bal, DO;  [facility]: [facility];  Service: Endoscopy;  Laterality: N/A;   BIOPSY  05/28/2022   Procedure: BIOPSY;  Surgeon: Lanelle Bal, DO;  [facility]: [facility];  Service: Endoscopy;;   COLONOSCOPY WITH PROPOFOL N/A 12/25/2021    Procedure: COLONOSCOPY WITH PROPOFOL;  Surgeon: Lanelle Bal, DO;  [facility]: [facility];  Service: Endoscopy;  Laterality: N/A;  2:30pm   ESOPHAGOGASTRODUODENOSCOPY (EGD) WITH PROPOFOL N/A 05/28/2022   Procedure: ESOPHAGOGASTRODUODENOSCOPY (EGD) WITH PROPOFOL;  Surgeon: Lanelle Bal, DO;  [facility]: [facility];  Service: Endoscopy;  Laterality: N/A;  10:00am, asa 3   LAPAROSCOPIC APPENDECTOMY N/A 05/20/2018   Procedure: APPENDECTOMY LAPAROSCOPIC;  Surgeon: Franky Macho, MD;  Location: [facility];  Service: General;  Laterality: N/A;   POLYPECTOMY  12/25/2021   Procedure: POLYPECTOMY;  Surgeon: Lanelle Bal, DO;  [facility]: [facility];  Service: Endoscopy;;   PORTACATH PLACEMENT Right 04/14/2018   Procedure: INSERTION PORT-A-CATH;  Surgeon: Franky Macho, MD;  Location: [facility];  Service: General;  Laterality: Right;    FAMILY HISTORY: Family History  Problem Relation Age of Onset   Heart disease Mother    Emphysema Father    Diabetes Sister    Depression Sister    Cancer Brother        liver, lung, and colon. colon cancer at age 57.   Diabetes Brother     SOCIAL HISTORY: Social History   Socioeconomic History   Marital status: Divorced    Spouse name: Not on file   Number of children: 6   Years of education: Not on file   Highest education level: Not on file  Occupational History    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  Tobacco Use   Smoking status: Never   Smokeless tobacco: Never  Vaping Use   Vaping status: Never Used  Substance and Sexual Activity   Alcohol use: Never   Drug use: Not Currently   Sexual activity: Not Currently  Other Topics Concern   Not on file  Social History Narrative   Not on file   Social Drivers of Health   Financial Resource Strain: Low Risk  (07/06/2020)   Overall Financial Resource Strain (CARDIA)    Difficulty of Paying Living Expenses: Not hard at all  Food Insecurity: No Food Insecurity (09/17/2022)   Hunger Vital Sign     Worried About Running Out of Food in the Last Year: Never true    Ran Out of Food in the Last Year: Never true  Transportation Needs: No Transportation Needs (  09/17/2022)   PRAPARE - Administrator, Civil Service (Medical): No    Lack of Transportation (Non-Medical): No  Physical Activity: Inactive (07/06/2020)   Exercise Vital Sign    Days of Exercise per Week: 0 days    Minutes of Exercise per Session: 0 min  Stress: No Stress Concern Present (07/06/2020)   [employer] of Occupational Health - Occupational Stress Questionnaire    Feeling of Stress : Not at all  Social Connections: Moderately Isolated (07/06/2020)   Social Connection and Isolation Panel [NHANES]    Frequency of Communication with Friends and Family: More than three times a week    Frequency of Social Gatherings with Friends and Family: Twice a week    Attends Religious Services: 1 to 4 times per year    Active Member of [employer] or Organizations: No    Attends Banker Meetings: Never    Marital Status: Separated  Intimate Partner Violence: Not At Risk (09/17/2022)   Humiliation, Afraid, Rape, and Kick questionnaire    Fear of Current or Ex-Partner: No    Emotionally Abused: No    Physically Abused: No    Sexually Abused: No      Levert Feinstein, M.D. Ph.D.  [facility] [address] [place], [place] [address] Ph: [phone] Fax: [phone]  CC:  Alvina Filbert, MD [address] [place],  [place] [address]  Alvina Filbert, MD

## 2023-10-19 LAB — TSH: TSH: 2.17 u[IU]/mL (ref 0.450–4.500)

## 2023-10-19 LAB — HGB A1C W/O EAG: Hgb A1c MFr Bld: 8.4 % — ABNORMAL HIGH (ref 4.8–5.6)

## 2023-10-19 LAB — VITAMIN B12: Vitamin B-12: 800 pg/mL (ref 232–1245)

## 2023-10-22 ENCOUNTER — Other Ambulatory Visit: Payer: Self-pay

## 2023-10-22 ENCOUNTER — Encounter: Payer: Self-pay | Admitting: Neurology

## 2023-10-23 ENCOUNTER — Inpatient Hospital Stay: Payer: 59 | Attending: Hematology

## 2023-10-23 ENCOUNTER — Inpatient Hospital Stay: Payer: 59

## 2023-10-23 VITALS — BP 137/75 | HR 74 | Temp 96.7°F | Resp 20 | Wt 191.4 lb

## 2023-10-23 DIAGNOSIS — D72819 Decreased white blood cell count, unspecified: Secondary | ICD-10-CM | POA: Insufficient documentation

## 2023-10-23 DIAGNOSIS — Z5112 Encounter for antineoplastic immunotherapy: Secondary | ICD-10-CM | POA: Insufficient documentation

## 2023-10-23 DIAGNOSIS — C9002 Multiple myeloma in relapse: Secondary | ICD-10-CM | POA: Diagnosis present

## 2023-10-23 DIAGNOSIS — G629 Polyneuropathy, unspecified: Secondary | ICD-10-CM | POA: Diagnosis not present

## 2023-10-23 DIAGNOSIS — Z7901 Long term (current) use of anticoagulants: Secondary | ICD-10-CM | POA: Insufficient documentation

## 2023-10-23 DIAGNOSIS — C9000 Multiple myeloma not having achieved remission: Secondary | ICD-10-CM

## 2023-10-23 DIAGNOSIS — Z86718 Personal history of other venous thrombosis and embolism: Secondary | ICD-10-CM | POA: Insufficient documentation

## 2023-10-23 DIAGNOSIS — D696 Thrombocytopenia, unspecified: Secondary | ICD-10-CM | POA: Diagnosis not present

## 2023-10-23 DIAGNOSIS — F419 Anxiety disorder, unspecified: Secondary | ICD-10-CM | POA: Diagnosis not present

## 2023-10-23 DIAGNOSIS — Z95828 Presence of other vascular implants and grafts: Secondary | ICD-10-CM

## 2023-10-23 LAB — CBC WITH DIFFERENTIAL/PLATELET
Abs Immature Granulocytes: 0.01 10*3/uL (ref 0.00–0.07)
Basophils Absolute: 0 10*3/uL (ref 0.0–0.1)
Basophils Relative: 1 %
Eosinophils Absolute: 0.2 10*3/uL (ref 0.0–0.5)
Eosinophils Relative: 6 %
HCT: 32 % — ABNORMAL LOW (ref 36.0–46.0)
Hemoglobin: 10 g/dL — ABNORMAL LOW (ref 12.0–15.0)
Immature Granulocytes: 0 %
Lymphocytes Relative: 18 %
Lymphs Abs: 0.6 10*3/uL — ABNORMAL LOW (ref 0.7–4.0)
MCH: 31.3 pg (ref 26.0–34.0)
MCHC: 31.3 g/dL (ref 30.0–36.0)
MCV: 100 fL (ref 80.0–100.0)
Monocytes Absolute: 0.4 10*3/uL (ref 0.1–1.0)
Monocytes Relative: 11 %
Neutro Abs: 2.2 10*3/uL (ref 1.7–7.7)
Neutrophils Relative %: 64 %
Platelets: 132 10*3/uL — ABNORMAL LOW (ref 150–400)
RBC: 3.2 MIL/uL — ABNORMAL LOW (ref 3.87–5.11)
RDW: 12.9 % (ref 11.5–15.5)
WBC: 3.5 10*3/uL — ABNORMAL LOW (ref 4.0–10.5)
nRBC: 0 % (ref 0.0–0.2)

## 2023-10-23 LAB — COMPREHENSIVE METABOLIC PANEL
ALT: 34 U/L (ref 0–44)
AST: 24 U/L (ref 15–41)
Albumin: 3.8 g/dL (ref 3.5–5.0)
Alkaline Phosphatase: 90 U/L (ref 38–126)
Anion gap: 9 (ref 5–15)
BUN: 33 mg/dL — ABNORMAL HIGH (ref 8–23)
CO2: 27 mmol/L (ref 22–32)
Calcium: 9 mg/dL (ref 8.9–10.3)
Chloride: 102 mmol/L (ref 98–111)
Creatinine, Ser: 1.69 mg/dL — ABNORMAL HIGH (ref 0.44–1.00)
GFR, Estimated: 31 mL/min — ABNORMAL LOW (ref >60)
Glucose, Bld: 188 mg/dL — ABNORMAL HIGH (ref 70–99)
Potassium: 4.4 mmol/L (ref 3.5–5.1)
Sodium: 138 mmol/L (ref 135–145)
Total Bilirubin: 0.5 mg/dL (ref 0.0–1.2)
Total Protein: 6.5 g/dL (ref 6.5–8.1)

## 2023-10-23 MED ORDER — SODIUM CHLORIDE 0.9% FLUSH
10.0000 mL | Freq: Once | INTRAVENOUS | Status: AC
  Administered 2023-10-23: 10 mL via INTRAVENOUS

## 2023-10-23 MED ORDER — BORTEZOMIB CHEMO SQ INJECTION 3.5 MG (2.5MG/ML)
1.0000 mg/m2 | Freq: Once | INTRAMUSCULAR | Status: AC
  Administered 2023-10-23: 2 mg via SUBCUTANEOUS
  Filled 2023-10-23: qty 0.8

## 2023-10-23 MED ORDER — HEPARIN SOD (PORK) LOCK FLUSH 100 UNIT/ML IV SOLN
500.0000 [IU] | Freq: Once | INTRAVENOUS | Status: AC
  Administered 2023-10-23: 500 [IU] via INTRAVENOUS

## 2023-10-23 NOTE — Patient Instructions (Signed)
 [facility] [place] - [facility] OF [facility]  Discharge Instructions: Thank you for choosing [facility] to provide your oncology and hematology care.  If you have a lab appointment with the Cancer Center - please note that after April 8th, 2024, all labs will be drawn in the cancer center.  You do not have to check in or register with the main entrance as you have in the past but will complete your check-in in the cancer center.  Wear comfortable clothing and clothing appropriate for easy access to any Portacath or PICC line.   We strive to give you quality time with your provider. You may need to reschedule your appointment if you arrive late (15 or more minutes).  Arriving late affects you and other patients whose appointments are after yours.  Also, if you miss three or more appointments without notifying the office, you may be dismissed from the clinic at the provider's discretion.      For prescription refill requests, have your pharmacy contact our office and allow 72 hours for refills to be completed.    Today you received the following chemotherapy and/or immunotherapy agents Velcade      To help prevent nausea and vomiting after your treatment, we encourage you to take your nausea medication as directed.  BELOW ARE SYMPTOMS THAT SHOULD BE REPORTED IMMEDIATELY: *FEVER GREATER THAN 100.4 F (38 C) OR HIGHER *CHILLS OR SWEATING *NAUSEA AND VOMITING THAT IS NOT CONTROLLED WITH YOUR NAUSEA MEDICATION *UNUSUAL SHORTNESS OF BREATH *UNUSUAL BRUISING OR BLEEDING *URINARY PROBLEMS (pain or burning when urinating, or frequent urination) *BOWEL PROBLEMS (unusual diarrhea, constipation, pain near the anus) TENDERNESS IN MOUTH AND THROAT WITH OR WITHOUT PRESENCE OF ULCERS (sore throat, sores in mouth, or a toothache) UNUSUAL RASH, SWELLING OR PAIN  UNUSUAL VAGINAL DISCHARGE OR ITCHING   Items with * indicate a potential emergency and should be followed up  as soon as possible or go to the Emergency Department if any problems should occur.  Please show the CHEMOTHERAPY ALERT CARD or IMMUNOTHERAPY ALERT CARD at check-in to the Emergency Department and triage nurse.  Should you have questions after your visit or need to cancel or reschedule your appointment, please contact [facility] [place] - A DEPT OF [facility] [phone]  and follow the prompts.  Office hours are 8:00 a.m. to 4:30 p.m. Monday - Friday. Please note that voicemails left after 4:00 p.m. may not be returned until the following business day.  We are closed weekends and major holidays. You have access to a nurse at all times for urgent questions. Please call the main number to the clinic [phone] and follow the prompts.  For any non-urgent questions, you may also contact your provider using MyChart. We now offer e-Visits for anyone 44 and older to request care online for non-urgent symptoms. For details visit [email or web address].   Also download the MyChart app! Go to the app store, search "MyChart", open the app, select [place], and log in with your MyChart username and password.

## 2023-10-23 NOTE — Progress Notes (Signed)
Patient presents today for Velcade injection per providers order.  Vital signs and labs within parameters for treatment.  Patient has no new complaints at this time.  Stable during administration without incident; injection site WNL; see MAR for injection details.  Patient tolerated procedure well and without incident.  No questions or complaints noted at this time.

## 2023-10-23 NOTE — Progress Notes (Signed)
 Patients port flushed without difficulty.  Good blood return noted with no bruising or swelling noted at site.  Band aid applied.  VSS with discharge and left in satisfactory condition with no s/s of distress noted.

## 2023-10-24 ENCOUNTER — Other Ambulatory Visit: Payer: Self-pay | Admitting: Hematology

## 2023-10-24 NOTE — Procedures (Signed)
 Full Name: Alexandra Price Gender: Female MRN #: [id number] Date of Birth: [birth year]    Visit Date: 10/18/2023 10:03 Age: 78 Years Examining Physician: Dr. Levert Feinstein Referring Physician: Dr. Levert Feinstein Height: 5 feet 5 inch History: 78 year old female complains of bilateral feet paresthesia, history of chemotherapy  Summary of the test: Nerve conduction study: Bilateral sural, superficial peroneal sensory responses were absent.  Right median, ulnar and radial sensory responses were absent.  Bilateral peroneal to EDB tibial motor responses showed significantly decreased CMAP amplitude.  Right median motor responses showed moderately decreased CMAP amplitude.  Right ulnar motor response showed mildly decreased CMAP amplitude.  Electromyography: Selected needle examination of right lower extremity muscles, lumbosacral paraspinal muscles, right upper extremity muscles and cervical paraspinal muscles were performed  There was mild chronic neuropathic changes involving distal neck muscles.   Conclusion:  This is an abnormal study.  There is electrodiagnostic evidence of moderate length dependent axonal sensorimotor polyneuropathy.  Levert Feinstein. M.D. Ph.D.   [facility] [address] [place], [place] [address] Tel: [phone] Fax: [phone]  Verbal informed consent was obtained from the patient, patient was informed of potential risk of procedure, including bruising, bleeding, hematoma formation, infection, muscle weakness, muscle pain, numbness, among others.        MNC    Nerve / Sites Muscle Latency Ref. Amplitude Ref. Rel Amp Segments Distance Velocity Ref. Area    ms ms mV mV %  cm m/s m/s mVms  R Median - APB     Wrist APB 3.6 <=4.4 2.6 >=4.0 100 Wrist - APB 7   8.2     Upper arm APB 8.4  1.1  41.5 Upper arm - Wrist 22.6 47 >=49 3.5  R Ulnar - ADM     Wrist ADM 2.9 <=3.3 4.7 >=6.0 100 Wrist - ADM 7   14.9     B.Elbow ADM 4.4  3.1   65.7 B.Elbow - Wrist 8 51 >=49 9.5     A.Elbow ADM 8.9  4.3  139 A.Elbow - B.Elbow 24 54 >=49 14.4  R Peroneal - EDB     Ankle EDB 4.7 <=6.5 0.9 >=2.0 100 Ankle - EDB 9   2.5     Fib head EDB 11.8  0.9  104 Fib head - Ankle 27 38 >=44 2.6     Pop fossa EDB 13.9  1.0  115 Pop fossa - Fib head 10 49 >=44 2.6         Pop fossa - Ankle      L Peroneal - EDB     Ankle EDB 4.9 <=6.5 0.7 >=2.0 100 Ankle - EDB 9   2.2     Fib head EDB 13.5  0.5  77.3 Fib head - Ankle 28 33 >=44 1.3     Pop fossa EDB 15.2  0.5  96.8 Pop fossa - Fib head 9 51 >=44 1.2         Pop fossa - Ankle      R Tibial - AH     Ankle AH 3.3 <=5.8 1.2 >=4.0 100 Ankle - AH 9   3.4     Pop fossa AH 13.7  1.3  109 Pop fossa - Ankle 36 35 >=41 3.4  L Tibial - AH     Ankle AH 5.0 <=5.8 1.2 >=4.0 100 Ankle - AH 9   2.6     Pop fossa AH 15.0  1.6  127 Pop fossa - Ankle 34 34 >=41 4.4                 SNC    Nerve / Sites Rec. Site Peak Lat Ref.  Amp Ref. Segments Distance    ms ms V V  cm  R Radial - Anatomical snuff box (Forearm)     Forearm Wrist NR <=2.9 NR >=15 Forearm - Wrist 10  R Sural - Ankle (Calf)     Calf Ankle NR <=4.4 NR >=6 Calf - Ankle 14  L Sural - Ankle (Calf)     Calf Ankle NR <=4.4 NR >=6 Calf - Ankle 14  R Superficial peroneal - Ankle     Lat leg Ankle NR <=4.4 NR >=6 Lat leg - Ankle 14  L Superficial peroneal - Ankle     Lat leg Ankle NR <=4.4 NR >=6 Lat leg - Ankle 14  R Median - Orthodromic (Dig II, Mid palm)     Dig II Wrist NR <=3.4 NR >=10 Dig II - Wrist 13  R Ulnar - Orthodromic, (Dig V, Mid palm)     Dig V Wrist NR <=3.1 NR >=5 Dig V - Wrist 76                   F  Wave    Nerve F Lat Ref.   ms ms  R Tibial - AH NR <=56.0  L Tibial - AH NR <=56.0  R Ulnar - ADM 30.6 <=32.0  R Peroneal - EDB 28.2 <=56.0             EMG Summary Table    Spontaneous MUAP Recruitment  Muscle IA Fib PSW Fasc Other Amp Dur. Poly Pattern  R. Tibialis anterior Normal None None None _______ Normal Normal  Normal Reduced  R. Tibialis posterior Normal None None None _______ Normal Normal Normal Normal  R. Peroneus longus Normal None None None _______ Normal Normal Normal Normal  R. Gastrocnemius (Medial head) Normal None None None _______ Normal Normal Normal Normal  R. Vastus lateralis Normal None None None _______ Normal Normal Normal Normal  R. Lumbar paraspinals (low) Normal None None None _______ Normal Normal Normal Normal  R. Lumbar paraspinals (mid) Normal None None None _______ Normal Normal Normal Normal  R. First dorsal interosseous Normal None None None _______ Normal Normal Normal Normal  R. Pronator teres Normal None None None _______ Normal Normal Normal Normal  R. Biceps brachii Normal None None None _______ Normal Normal Normal Normal  R. Deltoid Normal None None None _______ Normal Normal Normal Normal  R. Triceps brachii Normal None None None _______ Normal Normal Normal Normal  R. Extensor digitorum communis Normal None None None _______ Normal Normal Normal Normal  R. Cervical paraspinals Normal None None None _______ Normal Normal Normal Normal

## 2023-10-29 ENCOUNTER — Other Ambulatory Visit: Payer: Self-pay | Admitting: *Deleted

## 2023-10-29 MED ORDER — ZOLPIDEM TARTRATE 10 MG PO TABS: 10.0000 mg | ORAL_TABLET | Freq: Every evening | ORAL | 5 refills | Status: DC | PRN

## 2023-11-04 ENCOUNTER — Other Ambulatory Visit: Payer: Self-pay

## 2023-11-05 ENCOUNTER — Encounter: Payer: Self-pay | Admitting: Hematology

## 2023-11-05 NOTE — Progress Notes (Signed)
 [facility] [address] [place] [address]    Clinic Day:  11/06/2023  Referring physician: Alvina Filbert, MD  Patient Care Team: Alvina Filbert, MD as PCP - General (Internal Medicine) Doreatha Massed, MD as Medical Oncologist (Medical Oncology) Lanelle Bal, DO as Consulting Physician (Gastroenterology)   ASSESSMENT & PLAN:   Assessment: 1.  IgG lambda plasma cell myeloma, stage II, standard risk: -4 cycles of KPD from 04/17/2018 through 08/21/2018, stem cell transplant on 10/23/2018. -PET scan on 01/21/2019 showed multiple bone lesions but without any hypermetabolic activity. -BMBX on 01/21/2019 with normocellular marrow with no increase in plasma cells.  Normal FISH.  MRD results negative. -Maintenance pomalidomide 2 mg 3 weeks on/1 week off started on 03/10/2019. -Bone marrow biopsy on 10/29/2019 shows trilineage hematopoiesis with no evidence of plasma cells.  Chromosome analysis and FISH are normal. -BM BX on 10/27/2020 at [facility]-normocellular marrow with 30 to 40%, TLH.  2% of the total cells are CD 138+ plasma cells. - Pomalyst was held due to dizziness from 03/29/2021 through 04/26/2021. - MRI of the brain on 03/31/2021 did not show any evidence of intracranial lesions.  Multiple bone lesions in the skull compatible with sequela of myeloma.  - Labs at [facility] on 11/16/2021: M spike 0.24 g.  Immunofixation positive for IgG lambda. - PET scan on 12/14/2021: No evidence of FDG avid osseous or soft tissue myeloma. - She has developed left shoulder blade and right posterior rib pain for the last 3 to 4 months. - 24-hour urine on 12/14/2021: Total protein 136 mg.  Immune immunofixation positive for lambda type Bence-Jones protein. -- PET scan (05/24/2022): Single new hypermetabolic lytic lesion involving left lamina of T11.  No canal encroachment.  No other hypermetabolic bone lesions.  Stable diffuse lytic lesions throughout the axial and appendicular skeleton.  No  soft tissue lesions. - Maintenance Pomalyst discontinued on 05/29/2022 - XRT to the T11 completed on 06/20/2022 - Daratumumab, Velcade and dexamethasone started on 06/11/2022   2.  Pulmonary embolism: -CT angiogram on 04/21/2020 showed filling defect at [facility]. -VQ scan confirmed pulmonary embolism. -She is on Eliquis.   3.  Right shoulder and upper arm pain: - She received XRT to the chest from 12/26/2020 through 01/06/2021.   4.  Myeloma bone disease: - Denosumab held due to exposure of bone in the right lower jaw.  Last dose in 2020.    Plan: 1.  IgG lambda plasma cell myeloma: - She is tolerating Velcade and monthly Darzalex very well.  Denies any infections in the past 3 months. - Myeloma labs from 09/30/2022: M spike is not detected.  FLC ratio is normal.  Myeloma labs from today are pending. - Creatinine is 1.76 and calcium normal.  CBC with mild leukopenia and thrombocytopenia which is stable. - Continue Velcade every 2 weeks and Darzalex monthly.  Continue dexamethasone 20 mg on days of Darzalex.  RTC 12 weeks for follow-up with repeat myeloma labs.   2.  Back pain/left shoulder blade pain/right lateral and posterior rib pains: - Continue hydrocodone 10/325 every 6-8 hours as needed.   3.  Hypomagnesemia: - Continue magnesium once daily.  Magnesium is normal today.   4.  Anxiety: - Continue Xanax 0.5 mg twice daily.   5.  Sleeping difficulty: - Continue Ambien daily as needed which is helping.   6.  Peripheral neuropathy: - She reports her feet are feeling cold all the time.  Fingers ache  occasionally.  Tingling in the fingers is stable.  She was taking gabapentin 3 mg twice daily.  She was evaluated by neurology and was recommended to increase gabapentin to 300 mg 3 times daily.   7.  Pulmonary embolism: - Continue Eliquis twice daily.  No bleeding issues.    Orders Placed This Encounter  Procedures   Comprehensive metabolic panel    Standing Status:    Future    Expected Date:   01/01/2024    Expiration Date:   12/31/2024   CBC with Differential    Standing Status:   Future    Expected Date:   01/01/2024    Expiration Date:   12/31/2024   CBC with Differential    Standing Status:   Future    Expected Date:   01/15/2024    Expiration Date:   01/14/2025   Comprehensive metabolic panel    Standing Status:   Future    Expected Date:   01/15/2024    Expiration Date:   01/14/2025   Comprehensive metabolic panel    Standing Status:   Future    Expected Date:   01/29/2024    Expiration Date:   01/28/2025   CBC with Differential    Standing Status:   Future    Expected Date:   01/29/2024    Expiration Date:   01/28/2025   CBC with Differential    Standing Status:   Future    Expected Date:   02/12/2024    Expiration Date:   02/11/2025   Comprehensive metabolic panel    Standing Status:   Future    Expected Date:   02/12/2024    Expiration Date:   02/11/2025      I,Katie Daubenspeck,acting as a scribe for Doreatha Massed, MD.,have documented all relevant documentation on the behalf of Doreatha Massed, MD,as directed by  Doreatha Massed, MD while in the presence of Doreatha Massed, MD.   I, Doreatha Massed MD, have reviewed the above documentation for accuracy and completeness, and I agree with the above.   Doreatha Massed, MD   3/19/202512:47 PM  CHIEF COMPLAINT:   Diagnosis: multiple myeloma    Cancer Staging  No matching staging information was found for the patient.    Prior Therapy: 1. KPD x 4 cycles from 04/17/2018 to 08/21/2018. 2. Stem cell transplant on 10/23/2018. 3. Radiation to left and right humerus 25 Gy in 10 fractions from 08/23/2020 to 08/25/2020. 4.  Maintenance Pomalyst until 06/08/2022  Current Therapy:  Darzalex, Velcade and dexamethasone started on 06/11/2022    HISTORY OF PRESENT ILLNESS:   Oncology History  Multiple myeloma not having achieved remission (HCC)  04/07/2018 Initial  Diagnosis   Multiple myeloma not having achieved remission (HCC)   06/11/2022 -  Chemotherapy   Patient is on Treatment Plan : MYELOMA RELAPSED / REFRACTORY Daratumumab SQ + Bortezomib + Dexamethasone (DaraVd) q21d / Daratumumab SQ q28d      Multiple myeloma without remission (HCC)  04/15/2018 Initial Diagnosis   Multiple myeloma without remission (HCC)   04/17/2018 - 09/05/2018 Chemotherapy   The patient had dexamethasone (DECADRON) 4 MG tablet, 1 of 1 cycle, Start date: 04/15/2018, End date: 05/26/2018 palonosetron (ALOXI) injection 0.25 mg, 0.25 mg, Intravenous,  Once, 1 of 1 cycle Administration: 0.25 mg (04/17/2018), 0.25 mg (04/24/2018), 0.25 mg (05/01/2018) cyclophosphamide (CYTOXAN) 540 mg in sodium chloride 0.9 % 250 mL chemo infusion, 300 mg/m2 = 540 mg, Intravenous,  Once, 1 of 1 cycle Administration: 540 mg (04/17/2018),  540 mg (04/24/2018), 540 mg (05/01/2018) carfilzomib (KYPROLIS) 36 mg in dextrose 5 % 50 mL chemo infusion, 20 mg/m2 = 36 mg, Intravenous, Once, 5 of 5 cycles Administration: 36 mg (04/17/2018), 36 mg (04/18/2018), 60 mg (04/24/2018), 60 mg (04/25/2018), 60 mg (05/01/2018), 60 mg (05/02/2018), 60 mg (05/15/2018), 60 mg (05/16/2018), 60 mg (06/12/2018), 60 mg (06/13/2018), 60 mg (06/26/2018), 60 mg (06/27/2018), 60 mg (07/03/2018), 60 mg (07/04/2018), 60 mg (07/24/2018), 60 mg (07/25/2018), 60 mg (07/31/2018), 60 mg (08/01/2018), 60 mg (08/08/2018), 60 mg (08/07/2018), 60 mg (08/21/2018), 60 mg (08/22/2018), 60 mg (08/28/2018), 60 mg (08/29/2018), 60 mg (09/04/2018), 60 mg (09/05/2018)  for chemotherapy treatment.    06/11/2022 -  Chemotherapy   Patient is on Treatment Plan : MYELOMA RELAPSED / REFRACTORY Daratumumab SQ + Bortezomib + Dexamethasone (DaraVd) q21d / Daratumumab SQ q28d         INTERVAL HISTORY:   Marvin is a 78 y.o. female presenting to clinic today for follow up of multiple myeloma. She was last seen by me on 08/13/23.  Today, she states that she is doing well overall. Her  appetite level is at 100%. Her energy level is at 70%.  PAST MEDICAL HISTORY:   Past Medical History: Past Medical History:  Diagnosis Date   Anxiety    Chronic kidney disease    Depression    Hypertension    Multiple myeloma (HCC)    multiple myeloma   Pre-diabetes     Surgical History: Past Surgical History:  Procedure Laterality Date   ABDOMINAL HYSTERECTOMY     total   APPENDECTOMY     BALLOON DILATION N/A 05/28/2022   Procedure: BALLOON DILATION;  Surgeon: Lanelle Bal, DO;  [facility];  Service: Endoscopy;  Laterality: N/A;   BIOPSY  05/28/2022   Procedure: BIOPSY;  Surgeon: Lanelle Bal, DO;  [facility]: [facility];  Service: Endoscopy;;   COLONOSCOPY WITH PROPOFOL N/A 12/25/2021   Procedure: COLONOSCOPY WITH PROPOFOL;  Surgeon: Lanelle Bal, DO;  [facility]: [facility];  Service: Endoscopy;  Laterality: N/A;  2:30pm   ESOPHAGOGASTRODUODENOSCOPY (EGD) WITH PROPOFOL N/A 05/28/2022   Procedure: ESOPHAGOGASTRODUODENOSCOPY (EGD) WITH PROPOFOL;  Surgeon: Lanelle Bal, DO;  [facility]: [facility];  Service: Endoscopy;  Laterality: N/A;  10:00am, asa 3   LAPAROSCOPIC APPENDECTOMY N/A 05/20/2018   Procedure: APPENDECTOMY LAPAROSCOPIC;  Surgeon: Franky Macho, MD;  Location: [facility];  Service: General;  Laterality: N/A;   POLYPECTOMY  12/25/2021   Procedure: POLYPECTOMY;  Surgeon: Lanelle Bal, DO;  Location: [facility];  Service: Endoscopy;;   PORTACATH PLACEMENT Right 04/14/2018   Procedure: INSERTION PORT-A-CATH;  Surgeon: Franky Macho, MD;  Location: [facility];  Service: General;  Laterality: Right;    Social History: Social History   Socioeconomic History   Marital status: Divorced    Spouse name: Not on file   Number of children: 6   Years of education: Not on file   Highest education level: Not on file  Occupational History    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  Tobacco Use   Smoking status: Never   Smokeless tobacco: Never   Vaping Use   Vaping status: Never Used  Substance and Sexual Activity   Alcohol use: Never   Drug use: Not Currently   Sexual activity: Not Currently  Other Topics Concern   Not on file  Social History Narrative   Not on file   Social Drivers of Health   Financial Resource Strain: Low Risk  (  07/06/2020)   Overall Financial Resource Strain (CARDIA)    Difficulty of Paying Living Expenses: Not hard at all  Food Insecurity: No Food Insecurity (09/17/2022)   Hunger Vital Sign    Worried About Running Out of Food in the Last Year: Never true    Ran Out of Food in the Last Year: Never true  Transportation Needs: No Transportation Needs (09/17/2022)   PRAPARE - Administrator, Civil Service (Medical): No    Lack of Transportation (Non-Medical): No  Physical Activity: Inactive (07/06/2020)   Exercise Vital Sign    Days of Exercise per Week: 0 days    Minutes of Exercise per Session: 0 min  Stress: No Stress Concern Present (07/06/2020)   [employer] of Occupational Health - Occupational Stress Questionnaire    Feeling of Stress : Not at all  Social Connections: Moderately Isolated (07/06/2020)   Social Connection and Isolation Panel [NHANES]    Frequency of Communication with Friends and Family: More than three times a week    Frequency of Social Gatherings with Friends and Family: Twice a week    Attends Religious Services: 1 to 4 times per year    Active Member of [employer] or Organizations: No    Attends Banker Meetings: Never    Marital Status: Separated  Intimate Partner Violence: Not At Risk (09/17/2022)   Humiliation, Afraid, Rape, and Kick questionnaire    Fear of Current or Ex-Partner: No    Emotionally Abused: No    Physically Abused: No    Sexually Abused: No    Family History: Family History  Problem Relation Age of Onset   Heart disease Mother    Emphysema Father    Diabetes Sister    Depression Sister    Cancer Brother         liver, lung, and colon. colon cancer at age 55.   Diabetes Brother     Current Medications:  Current Outpatient Medications:    acetaminophen (TYLENOL) 325 MG tablet, Take 650 mg by mouth as needed for moderate pain (prior to infusion)., Disp: , Rfl:    acyclovir (ZOVIRAX) 400 MG tablet, Take 1 tablet (400 mg total) by mouth 2 (two) times daily., Disp: 60 tablet, Rfl: 6   ALPRAZolam (XANAX) 0.25 MG tablet, Take one tablet (0.25 mg) by mouth in the morning as needed, and two tablets (0.5 mg) at bedtime as needed, Disp: 90 tablet, Rfl: 2   Calcium Carb-Cholecalciferol (CALCIUM 1000 + D PO), Take 1,000 mg by mouth daily., Disp: , Rfl:    dexAMETHasone 20 MG TABS, Take 1 tablet by mouth as directed. Take 1 tablet 30 minutes prior to infusions (Patient taking differently: Take 1 tablet by mouth as directed. Take 1 tablet 30 minutes prior to the Darzelex injections-once a month), Disp: 30 tablet, Rfl: 2   diclofenac Sodium (VOLTAREN) 1 % GEL, 4 gram every 8 hours as needed., Disp: 100 g, Rfl: 11   diphenhydrAMINE (BENADRYL) 25 MG tablet, Take 50 mg by mouth as needed (prior to infusion)., Disp: , Rfl:    ELIQUIS 2.5 MG TABS tablet, Take 2.5 mg by mouth 2 (two) times daily., Disp: , Rfl:    escitalopram (LEXAPRO) 20 MG tablet, Take 1 tablet by mouth once daily, Disp: 30 tablet, Rfl: 0   furosemide (LASIX) 20 MG tablet, Take 1 tablet (20 mg total) by mouth as needed (in am as needed for swelling)., Disp: 30 tablet, Rfl: 2   gabapentin (NEURONTIN)  300 MG capsule, Take 1 capsule (300 mg total) by mouth 3 (three) times daily., Disp: 90 capsule, Rfl: 11   glipiZIDE (GLUCOTROL) 5 MG tablet, TAKE 1 TABLET BY MOUTH ONCE DAILY BEFORE BREAKFAST, Disp: 90 tablet, Rfl: 0   HYDROcodone-acetaminophen (NORCO) 10-325 MG tablet, Take 1 tablet by mouth every 6 (six) hours as needed., Disp: 120 tablet, Rfl: 0   lidocaine-prilocaine (EMLA) cream, Apply 1 Application topically as needed. 1 squeeze every 8 hours, Disp: 30 g,  Rfl: 11   magnesium oxide (MAG-OX) 400 (240 Mg) MG tablet, Take 1 tablet by mouth 2 (two) times daily., Disp: , Rfl:    midodrine (PROAMATINE) 2.5 MG tablet, Take 1 tablet (2.5 mg total) by mouth 3 (three) times daily with meals., Disp: 90 tablet, Rfl: 1   Multiple Vitamin (MULTI VITAMIN) TABS, Take 1 tablet by mouth daily., Disp: , Rfl:    pantoprazole (PROTONIX) 40 MG tablet, Take 1 tablet by mouth once daily, Disp: 90 tablet, Rfl: 1   polyethylene glycol (MIRALAX / GLYCOLAX) 17 g packet, Take 17 g by mouth daily as needed for moderate constipation., Disp: 14 each, Rfl: 0   prochlorperazine (COMPAZINE) 10 MG tablet, Take 1 tablet (10 mg total) by mouth every 6 (six) hours as needed for nausea or vomiting., Disp: 30 tablet, Rfl: 0   rosuvastatin (CRESTOR) 10 MG tablet, Take 10 mg by mouth daily., Disp: , Rfl:    zolpidem (AMBIEN) 10 MG tablet, Take 1 tablet (10 mg total) by mouth at bedtime as needed., Disp: 30 tablet, Rfl: 5   Allergies: Allergies  Allergen Reactions   Ciprofloxacin Anaphylaxis   Amoxicillin Other (See Comments)    unknown   Morphine And Codeine Nausea And Vomiting   Augmentin [Amoxicillin-Pot Clavulanate] Other (See Comments)    Headache, insomnia    REVIEW OF SYSTEMS:   Review of Systems  Constitutional:  Negative for chills, fatigue and fever.  HENT:   Negative for lump/mass, mouth sores, nosebleeds, sore throat and trouble swallowing.   Eyes:  Negative for eye problems.  Respiratory:  Negative for cough and shortness of breath.   Cardiovascular:  Negative for chest pain, leg swelling and palpitations.  Gastrointestinal:  Negative for abdominal pain, constipation, diarrhea, nausea and vomiting.  Genitourinary:  Negative for bladder incontinence, difficulty urinating, dysuria, frequency, hematuria and nocturia.   Musculoskeletal:  Negative for arthralgias, back pain, flank pain, myalgias and neck pain.  Skin:  Negative for itching and rash.  Neurological:   Positive for dizziness, headaches and numbness.  Hematological:  Does not bruise/bleed easily.  Psychiatric/Behavioral:  Positive for depression. Negative for sleep disturbance and suicidal ideas. The patient is nervous/anxious.   All other systems reviewed and are negative.    VITALS:   Blood pressure 130/75.  Wt Readings from Last 3 Encounters:  11/06/23 189 lb (85.7 kg)  10/23/23 191 lb 5.8 oz (86.8 kg)  10/09/23 194 lb 0.1 oz (88 kg)    There is no height or weight on file to calculate BMI.  Performance status (ECOG): 1 - Symptomatic but completely ambulatory  PHYSICAL EXAM:   Physical Exam Vitals and nursing note reviewed. Exam conducted with a chaperone present.  Constitutional:      Appearance: Normal appearance.  Cardiovascular:     Rate and Rhythm: Normal rate and regular rhythm.     Pulses: Normal pulses.     Heart sounds: Normal heart sounds.  Pulmonary:     Effort: Pulmonary effort is normal.  Breath sounds: Normal breath sounds.  Abdominal:     Palpations: Abdomen is soft. There is no hepatomegaly, splenomegaly or mass.     Tenderness: There is no abdominal tenderness.  Musculoskeletal:     Right lower leg: No edema.     Left lower leg: No edema.  Lymphadenopathy:     Cervical: No cervical adenopathy.     Right cervical: No superficial, deep or posterior cervical adenopathy.    Left cervical: No superficial, deep or posterior cervical adenopathy.     Upper Body:     Right upper body: No supraclavicular or axillary adenopathy.     Left upper body: No supraclavicular or axillary adenopathy.  Neurological:     General: No focal deficit present.     Mental Status: She is alert and oriented to person, place, and time.  Psychiatric:        Mood and Affect: Mood normal.        Behavior: Behavior normal.     LABS:   CBC     Component Value Date/Time   WBC 3.4 (L) 11/06/2023 1105   RBC 3.24 (L) 11/06/2023 1105   HGB 10.2 (L) 11/06/2023 1105   HGB  9.4 (L) 12/15/2018 1244   HCT 32.3 (L) 11/06/2023 1105   PLT 142 (L) 11/06/2023 1105   MCV 99.7 11/06/2023 1105   MCH 31.5 11/06/2023 1105   MCHC 31.6 11/06/2023 1105   RDW 12.9 11/06/2023 1105   LYMPHSABS 0.6 (L) 11/06/2023 1105   MONOABS 0.3 11/06/2023 1105   EOSABS 0.2 11/06/2023 1105   BASOSABS 0.0 11/06/2023 1105    CMP      Component Value Date/Time   NA 138 11/06/2023 1105   K 4.2 11/06/2023 1105   CL 99 11/06/2023 1105   CO2 25 11/06/2023 1105   GLUCOSE 212 (H) 11/06/2023 1105   BUN 31 (H) 11/06/2023 1105   CREATININE 1.76 (H) 11/06/2023 1105   CALCIUM 9.3 11/06/2023 1105   PROT 6.8 11/06/2023 1105   ALBUMIN 4.1 11/06/2023 1105   AST 26 11/06/2023 1105   ALT 34 11/06/2023 1105   ALKPHOS 100 11/06/2023 1105   BILITOT 0.5 11/06/2023 1105   GFRNONAA 30 (L) 11/06/2023 1105   GFRAA 30 (L) 05/02/2020 0939     No results found for: "CEA1", "CEA" / No results found for: "CEA1", "CEA" No results found for: "PSA1" No results found for: "CAN199" No results found for: "CAN125"  Lab Results  Component Value Date   TOTALPROTELP 5.9 (L) 07/31/2023   ALBUMINELP 3.8 07/31/2023   A1GS 0.2 07/31/2023   A2GS 0.9 07/31/2023   BETS 0.7 07/31/2023   GAMS 0.3 (L) 07/31/2023   MSPIKE Not Observed 07/31/2023   SPEI Comment 07/31/2023   Lab Results  Component Value Date   TIBC 290 09/17/2022   TIBC 278 07/30/2022   TIBC 272 05/24/2022   FERRITIN 1,059 (H) 09/17/2022   FERRITIN 313 (H) 07/30/2022   FERRITIN 435 (H) 05/24/2022   IRONPCTSAT 10 (L) 09/17/2022   IRONPCTSAT 21 07/30/2022   IRONPCTSAT 27 05/24/2022   Lab Results  Component Value Date   LDH 129 05/24/2022   LDH 135 03/20/2022   LDH 129 12/14/2021     STUDIES:   NCV with EMG(electromyography) Result Date: 10/18/2023 Levert Feinstein, MD     10/24/2023  6:39 PM     Full Name: Akyia Borelli Gender: Female MRN #: [id number] Date of Birth: 1946/05/09   Visit Date: 10/18/2023 10:03 Age: 77 Years  Examining Physician: Dr.  Levert Feinstein Referring Physician: Dr. Levert Feinstein Height: 5 feet 5 inch History: 78 year old female complains of bilateral feet paresthesia, history of chemotherapy Summary of the test: Nerve conduction study: Bilateral sural, superficial peroneal sensory responses were absent.  Right median, ulnar and radial sensory responses were absent. Bilateral peroneal to EDB tibial motor responses showed significantly decreased CMAP amplitude. Right median motor responses showed moderately decreased CMAP amplitude. Right ulnar motor response showed mildly decreased CMAP amplitude. Electromyography: Selected needle examination of right lower extremity muscles, lumbosacral paraspinal muscles, right upper extremity muscles and cervical paraspinal muscles were performed There was mild chronic neuropathic changes involving distal neck muscles. Conclusion: This is an abnormal study.  There is electrodiagnostic evidence of moderate length dependent axonal sensorimotor polyneuropathy. Levert Feinstein. M.D. Ph.D. [facility] [address] [place], [place] [address] Tel: [phone] Fax: [phone] Verbal informed consent was obtained from the patient, patient was informed of potential risk of procedure, including bruising, bleeding, hematoma formation, infection, muscle weakness, muscle pain, numbness, among others.     MNC   Nerve / Sites Muscle Latency Ref. Amplitude Ref. Rel Amp Segments Distance Velocity Ref. Area   ms ms mV mV %  cm m/s m/s mVms R Median - APB    Wrist APB 3.6 <=4.4 2.6 >=4.0 100 Wrist - APB 7   8.2    Upper arm APB 8.4  1.1  41.5 Upper arm - Wrist 22.6 47 >=49 3.5 R Ulnar - ADM    Wrist ADM 2.9 <=3.3 4.7 >=6.0 100 Wrist - ADM 7   14.9    B.Elbow ADM 4.4  3.1  65.7 B.Elbow - Wrist 8 51 >=49 9.5    A.Elbow ADM 8.9  4.3  139 A.Elbow - B.Elbow 24 54 >=49 14.4 R Peroneal - EDB    Ankle EDB 4.7 <=6.5 0.9 >=2.0 100 Ankle - EDB 9   2.5    Fib head EDB 11.8  0.9  104 Fib head - Ankle 27 38 >=44 2.6     Pop fossa EDB 13.9  1.0  115 Pop fossa - Fib head 10 49 >=44 2.6        Pop fossa - Ankle     L Peroneal - EDB    Ankle EDB 4.9 <=6.5 0.7 >=2.0 100 Ankle - EDB 9   2.2    Fib head EDB 13.5  0.5  77.3 Fib head - Ankle 28 33 >=44 1.3    Pop fossa EDB 15.2  0.5  96.8 Pop fossa - Fib head 9 51 >=44 1.2        Pop fossa - Ankle     R Tibial - AH    Ankle AH 3.3 <=5.8 1.2 >=4.0 100 Ankle - AH 9   3.4    Pop fossa AH 13.7  1.3  109 Pop fossa - Ankle 36 35 >=41 3.4 L Tibial - AH    Ankle AH 5.0 <=5.8 1.2 >=4.0 100 Ankle - AH 9   2.6    Pop fossa AH 15.0  1.6  127 Pop fossa - Ankle 34 34 >=41 4.4               SNC   Nerve / Sites Rec. Site Peak Lat Ref.  Amp Ref. Segments Distance   ms ms V V  cm R Radial - Anatomical snuff box (Forearm)    Forearm Wrist NR <=2.9 NR >=15 Forearm - Wrist 10 R Sural -  Ankle (Calf)    Calf Ankle NR <=4.4 NR >=6 Calf - Ankle 14 L Sural - Ankle (Calf)    Calf Ankle NR <=4.4 NR >=6 Calf - Ankle 14 R Superficial peroneal - Ankle    Lat leg Ankle NR <=4.4 NR >=6 Lat leg - Ankle 14 L Superficial peroneal - Ankle    Lat leg Ankle NR <=4.4 NR >=6 Lat leg - Ankle 14 R Median - Orthodromic (Dig II, Mid palm)    Dig II Wrist NR <=3.4 NR >=10 Dig II - Wrist 13 R Ulnar - Orthodromic, (Dig V, Mid palm)    Dig V Wrist NR <=3.1 NR >=5 Dig V - Wrist 92                 F  Wave   Nerve F Lat Ref.  ms ms R Tibial - AH NR <=56.0 L Tibial - AH NR <=56.0 R Ulnar - ADM 30.6 <=32.0 R Peroneal - EDB 28.2 <=56.0           EMG Summary Table   Spontaneous MUAP Recruitment Muscle IA Fib PSW Fasc Other Amp Dur. Poly Pattern R. Tibialis anterior Normal None None None _______ Normal Normal Normal Reduced R. Tibialis posterior Normal None None None _______ Normal Normal Normal Normal R. Peroneus longus Normal None None None _______ Normal Normal Normal Normal R. Gastrocnemius (Medial head) Normal None None None _______ Normal Normal Normal Normal R. Vastus lateralis Normal None None None _______ Normal Normal Normal Normal R.  Lumbar paraspinals (low) Normal None None None _______ Normal Normal Normal Normal R. Lumbar paraspinals (mid) Normal None None None _______ Normal Normal Normal Normal R. First dorsal interosseous Normal None None None _______ Normal Normal Normal Normal R. Pronator teres Normal None None None _______ Normal Normal Normal Normal R. Biceps brachii Normal None None None _______ Normal Normal Normal Normal R. Deltoid Normal None None None _______ Normal Normal Normal Normal R. Triceps brachii Normal None None None _______ Normal Normal Normal Normal R. Extensor digitorum communis Normal None None None _______ Normal Normal Normal Normal R. Cervical paraspinals Normal None None None _______ Normal Normal Normal Normal

## 2023-11-06 ENCOUNTER — Inpatient Hospital Stay: Payer: 59

## 2023-11-06 ENCOUNTER — Other Ambulatory Visit: Payer: Self-pay | Admitting: *Deleted

## 2023-11-06 ENCOUNTER — Inpatient Hospital Stay (HOSPITAL_BASED_OUTPATIENT_CLINIC_OR_DEPARTMENT_OTHER): Payer: 59 | Admitting: Hematology

## 2023-11-06 ENCOUNTER — Other Ambulatory Visit: Payer: Self-pay

## 2023-11-06 VITALS — BP 130/75

## 2023-11-06 DIAGNOSIS — C9000 Multiple myeloma not having achieved remission: Secondary | ICD-10-CM

## 2023-11-06 DIAGNOSIS — Z5112 Encounter for antineoplastic immunotherapy: Secondary | ICD-10-CM | POA: Diagnosis not present

## 2023-11-06 LAB — CBC WITH DIFFERENTIAL/PLATELET
Abs Immature Granulocytes: 0.01 10*3/uL (ref 0.00–0.07)
Basophils Absolute: 0 10*3/uL (ref 0.0–0.1)
Basophils Relative: 1 %
Eosinophils Absolute: 0.2 10*3/uL (ref 0.0–0.5)
Eosinophils Relative: 5 %
HCT: 32.3 % — ABNORMAL LOW (ref 36.0–46.0)
Hemoglobin: 10.2 g/dL — ABNORMAL LOW (ref 12.0–15.0)
Immature Granulocytes: 0 %
Lymphocytes Relative: 17 %
Lymphs Abs: 0.6 10*3/uL — ABNORMAL LOW (ref 0.7–4.0)
MCH: 31.5 pg (ref 26.0–34.0)
MCHC: 31.6 g/dL (ref 30.0–36.0)
MCV: 99.7 fL (ref 80.0–100.0)
Monocytes Absolute: 0.3 10*3/uL (ref 0.1–1.0)
Monocytes Relative: 9 %
Neutro Abs: 2.3 10*3/uL (ref 1.7–7.7)
Neutrophils Relative %: 68 %
Platelets: 142 10*3/uL — ABNORMAL LOW (ref 150–400)
RBC: 3.24 MIL/uL — ABNORMAL LOW (ref 3.87–5.11)
RDW: 12.9 % (ref 11.5–15.5)
WBC: 3.4 10*3/uL — ABNORMAL LOW (ref 4.0–10.5)
nRBC: 0 % (ref 0.0–0.2)

## 2023-11-06 LAB — COMPREHENSIVE METABOLIC PANEL
ALT: 34 U/L (ref 0–44)
AST: 26 U/L (ref 15–41)
Albumin: 4.1 g/dL (ref 3.5–5.0)
Alkaline Phosphatase: 100 U/L (ref 38–126)
Anion gap: 14 (ref 5–15)
BUN: 31 mg/dL — ABNORMAL HIGH (ref 8–23)
CO2: 25 mmol/L (ref 22–32)
Calcium: 9.3 mg/dL (ref 8.9–10.3)
Chloride: 99 mmol/L (ref 98–111)
Creatinine, Ser: 1.76 mg/dL — ABNORMAL HIGH (ref 0.44–1.00)
GFR, Estimated: 30 mL/min — ABNORMAL LOW (ref >60)
Glucose, Bld: 212 mg/dL — ABNORMAL HIGH (ref 70–99)
Potassium: 4.2 mmol/L (ref 3.5–5.1)
Sodium: 138 mmol/L (ref 135–145)
Total Bilirubin: 0.5 mg/dL (ref 0.0–1.2)
Total Protein: 6.8 g/dL (ref 6.5–8.1)

## 2023-11-06 LAB — MAGNESIUM: Magnesium: 2.2 mg/dL (ref 1.7–2.4)

## 2023-11-06 MED ORDER — ACETAMINOPHEN 325 MG PO TABS: 650.0000 mg | ORAL_TABLET | Freq: Once | ORAL | Status: DC

## 2023-11-06 MED ORDER — SODIUM CHLORIDE 0.9% FLUSH
10.0000 mL | Freq: Once | INTRAVENOUS | Status: AC
  Administered 2023-11-06: 10 mL via INTRAVENOUS

## 2023-11-06 MED ORDER — DEXAMETHASONE 4 MG PO TABS: 20.0000 mg | ORAL_TABLET | Freq: Once | ORAL | Status: DC

## 2023-11-06 MED ORDER — DARATUMUMAB-HYALURONIDASE-FIHJ 1800-30000 MG-UT/15ML ~~LOC~~ SOLN
1800.0000 mg | Freq: Once | SUBCUTANEOUS | Status: AC
  Administered 2023-11-06: 1800 mg via SUBCUTANEOUS
  Filled 2023-11-06: qty 15

## 2023-11-06 MED ORDER — HYDROCODONE-ACETAMINOPHEN 10-325 MG PO TABS: 1.0000 | ORAL_TABLET | Freq: Four times a day (QID) | ORAL | 0 refills | Status: DC | PRN

## 2023-11-06 MED ORDER — BORTEZOMIB CHEMO SQ INJECTION 3.5 MG (2.5MG/ML)
1.0000 mg/m2 | Freq: Once | INTRAMUSCULAR | Status: AC
  Administered 2023-11-06: 2 mg via SUBCUTANEOUS
  Filled 2023-11-06: qty 0.8

## 2023-11-06 MED ORDER — HEPARIN SOD (PORK) LOCK FLUSH 100 UNIT/ML IV SOLN
500.0000 [IU] | Freq: Once | INTRAVENOUS | Status: AC
  Administered 2023-11-06: 500 [IU] via INTRAVENOUS

## 2023-11-06 MED ORDER — DIPHENHYDRAMINE HCL 25 MG PO CAPS
50.0000 mg | ORAL_CAPSULE | Freq: Once | ORAL | Status: DC
Start: 2023-11-06 — End: 2023-11-06

## 2023-11-06 NOTE — Patient Instructions (Signed)
 [facility] [place] - A DEPT OF [facility]  Discharge Instructions: Thank you for choosing [facility] to provide your oncology and hematology care.  If you have a lab appointment with the Cancer Center - please note that after April 8th, 2024, all labs will be drawn in the cancer center.  You do not have to check in or register with the main entrance as you have in the past but will complete your check-in in the cancer center.  Wear comfortable clothing and clothing appropriate for easy access to any Portacath or PICC line.   We strive to give you quality time with your provider. You may need to reschedule your appointment if you arrive late (15 or more minutes).  Arriving late affects you and other patients whose appointments are after yours.  Also, if you miss three or more appointments without notifying the office, you may be dismissed from the clinic at the provider's discretion.      For prescription refill requests, have your pharmacy contact our office and allow 72 hours for refills to be completed.    Today you received the following chemotherapy and/or immunotherapy agents velcade daratumumab      To help prevent nausea and vomiting after your treatment, we encourage you to take your nausea medication as directed.  BELOW ARE SYMPTOMS THAT SHOULD BE REPORTED IMMEDIATELY: *FEVER GREATER THAN 100.4 F (38 C) OR HIGHER *CHILLS OR SWEATING *NAUSEA AND VOMITING THAT IS NOT CONTROLLED WITH YOUR NAUSEA MEDICATION *UNUSUAL SHORTNESS OF BREATH *UNUSUAL BRUISING OR BLEEDING *URINARY PROBLEMS (pain or burning when urinating, or frequent urination) *BOWEL PROBLEMS (unusual diarrhea, constipation, pain near the anus) TENDERNESS IN MOUTH AND THROAT WITH OR WITHOUT PRESENCE OF ULCERS (sore throat, sores in mouth, or a toothache) UNUSUAL RASH, SWELLING OR PAIN  UNUSUAL VAGINAL DISCHARGE OR ITCHING   Items with * indicate a potential emergency and should be  followed up as soon as possible or go to the Emergency Department if any problems should occur.  Please show the CHEMOTHERAPY ALERT CARD or IMMUNOTHERAPY ALERT CARD at check-in to the Emergency Department and triage nurse.  Should you have questions after your visit or need to cancel or reschedule your appointment, please contact [facility] [place] - A DEPT OF Eligha Bridegroom [facility] [phone]  and follow the prompts.  Office hours are 8:00 a.m. to 4:30 p.m. Monday - Friday. Please note that voicemails left after 4:00 p.m. may not be returned until the following business day.  We are closed weekends and major holidays. You have access to a nurse at all times for urgent questions. Please call the main number to the clinic [phone] and follow the prompts.  For any non-urgent questions, you may also contact your provider using MyChart. We now offer e-Visits for anyone 32 and older to request care online for non-urgent symptoms. For details visit [email or web address].   Also download the MyChart app! Go to the app store, search "MyChart", open the app, select [place], and log in with your MyChart username and password.

## 2023-11-06 NOTE — Progress Notes (Signed)
 Patient presents today for Velcade/Daratumumab injections per providers order.  Vital signs and labs reviewed by MD.  Stable during administration without incident; injection site WNL; see MAR for injection details.  Patient tolerated procedure well and without incident.  No questions or complaints noted at this time.

## 2023-11-06 NOTE — Patient Instructions (Signed)

## 2023-11-07 ENCOUNTER — Other Ambulatory Visit: Payer: Self-pay | Admitting: Hematology

## 2023-11-07 ENCOUNTER — Other Ambulatory Visit: Payer: Self-pay

## 2023-11-07 LAB — KAPPA/LAMBDA LIGHT CHAINS
Kappa free light chain: 4.1 mg/L (ref 3.3–19.4)
Kappa, lambda light chain ratio: 0.17 — ABNORMAL LOW (ref 0.26–1.65)
Lambda free light chains: 23.5 mg/L (ref 5.7–26.3)

## 2023-11-08 ENCOUNTER — Other Ambulatory Visit: Payer: Self-pay

## 2023-11-08 MED ORDER — GABAPENTIN 300 MG PO CAPS: 300.0000 mg | ORAL_CAPSULE | Freq: Three times a day (TID) | ORAL | 0 refills | Status: DC

## 2023-11-11 LAB — PROTEIN ELECTROPHORESIS, SERUM
A/G Ratio: 1.6 (ref 0.7–1.7)
Albumin ELP: 3.9 g/dL (ref 2.9–4.4)
Alpha-1-Globulin: 0.2 g/dL (ref 0.0–0.4)
Alpha-2-Globulin: 0.9 g/dL (ref 0.4–1.0)
Beta Globulin: 0.9 g/dL (ref 0.7–1.3)
Gamma Globulin: 0.4 g/dL (ref 0.4–1.8)
Globulin, Total: 2.4 g/dL (ref 2.2–3.9)
Total Protein ELP: 6.3 g/dL (ref 6.0–8.5)

## 2023-11-13 ENCOUNTER — Other Ambulatory Visit: Payer: Self-pay | Admitting: *Deleted

## 2023-11-13 DIAGNOSIS — C9000 Multiple myeloma not having achieved remission: Secondary | ICD-10-CM

## 2023-11-13 MED ORDER — ACYCLOVIR 400 MG PO TABS
400.0000 mg | ORAL_TABLET | Freq: Two times a day (BID) | ORAL | 6 refills | Status: DC
Start: 2023-11-13 — End: 2024-05-21

## 2023-11-14 ENCOUNTER — Other Ambulatory Visit: Payer: Self-pay | Admitting: *Deleted

## 2023-11-14 DIAGNOSIS — C9000 Multiple myeloma not having achieved remission: Secondary | ICD-10-CM

## 2023-11-15 ENCOUNTER — Other Ambulatory Visit: Payer: Self-pay

## 2023-11-15 MED ORDER — MIDODRINE HCL 2.5 MG PO TABS: 2.5000 mg | ORAL_TABLET | Freq: Three times a day (TID) | ORAL | 1 refills | Status: DC

## 2023-11-19 ENCOUNTER — Other Ambulatory Visit: Payer: Self-pay | Admitting: *Deleted

## 2023-11-19 MED ORDER — ALPRAZOLAM 0.25 MG PO TABS: ORAL_TABLET | ORAL | 2 refills | Status: DC

## 2023-11-20 ENCOUNTER — Inpatient Hospital Stay: Payer: 59

## 2023-11-20 ENCOUNTER — Inpatient Hospital Stay: Payer: 59 | Attending: Hematology

## 2023-11-20 ENCOUNTER — Other Ambulatory Visit: Payer: Self-pay | Admitting: *Deleted

## 2023-11-20 DIAGNOSIS — C9002 Multiple myeloma in relapse: Secondary | ICD-10-CM | POA: Insufficient documentation

## 2023-11-20 DIAGNOSIS — Z5112 Encounter for antineoplastic immunotherapy: Secondary | ICD-10-CM | POA: Diagnosis present

## 2023-11-20 DIAGNOSIS — C9000 Multiple myeloma not having achieved remission: Secondary | ICD-10-CM

## 2023-11-20 LAB — CBC WITH DIFFERENTIAL/PLATELET
Abs Immature Granulocytes: 0.01 10*3/uL (ref 0.00–0.07)
Basophils Absolute: 0 10*3/uL (ref 0.0–0.1)
Basophils Relative: 1 %
Eosinophils Absolute: 0.2 10*3/uL (ref 0.0–0.5)
Eosinophils Relative: 4 %
HCT: 31 % — ABNORMAL LOW (ref 36.0–46.0)
Hemoglobin: 9.7 g/dL — ABNORMAL LOW (ref 12.0–15.0)
Immature Granulocytes: 0 %
Lymphocytes Relative: 14 %
Lymphs Abs: 0.6 10*3/uL — ABNORMAL LOW (ref 0.7–4.0)
MCH: 31.7 pg (ref 26.0–34.0)
MCHC: 31.3 g/dL (ref 30.0–36.0)
MCV: 101.3 fL — ABNORMAL HIGH (ref 80.0–100.0)
Monocytes Absolute: 0.3 10*3/uL (ref 0.1–1.0)
Monocytes Relative: 8 %
Neutro Abs: 2.8 10*3/uL (ref 1.7–7.7)
Neutrophils Relative %: 73 %
Platelets: 130 10*3/uL — ABNORMAL LOW (ref 150–400)
RBC: 3.06 MIL/uL — ABNORMAL LOW (ref 3.87–5.11)
RDW: 13.4 % (ref 11.5–15.5)
WBC: 3.9 10*3/uL — ABNORMAL LOW (ref 4.0–10.5)
nRBC: 0 % (ref 0.0–0.2)

## 2023-11-20 LAB — COMPREHENSIVE METABOLIC PANEL WITH GFR
ALT: 39 U/L (ref 0–44)
AST: 30 U/L (ref 15–41)
Albumin: 3.7 g/dL (ref 3.5–5.0)
Alkaline Phosphatase: 91 U/L (ref 38–126)
Anion gap: 11 (ref 5–15)
BUN: 28 mg/dL — ABNORMAL HIGH (ref 8–23)
CO2: 27 mmol/L (ref 22–32)
Calcium: 8.8 mg/dL — ABNORMAL LOW (ref 8.9–10.3)
Chloride: 99 mmol/L (ref 98–111)
Creatinine, Ser: 1.48 mg/dL — ABNORMAL HIGH (ref 0.44–1.00)
GFR, Estimated: 37 mL/min — ABNORMAL LOW (ref >60)
Glucose, Bld: 254 mg/dL — ABNORMAL HIGH (ref 70–99)
Potassium: 4.5 mmol/L (ref 3.5–5.1)
Sodium: 137 mmol/L (ref 135–145)
Total Bilirubin: 0.5 mg/dL (ref 0.0–1.2)
Total Protein: 6.3 g/dL — ABNORMAL LOW (ref 6.5–8.1)

## 2023-11-20 MED ORDER — BORTEZOMIB CHEMO SQ INJECTION 3.5 MG (2.5MG/ML)
1.0000 mg/m2 | Freq: Once | INTRAMUSCULAR | Status: AC
  Administered 2023-11-20: 2 mg via SUBCUTANEOUS
  Filled 2023-11-20: qty 0.8

## 2023-11-20 MED ORDER — SODIUM CHLORIDE 0.9% FLUSH
10.0000 mL | Freq: Once | INTRAVENOUS | Status: AC
  Administered 2023-11-20: 10 mL via INTRAVENOUS

## 2023-11-20 MED ORDER — HEPARIN SOD (PORK) LOCK FLUSH 100 UNIT/ML IV SOLN
500.0000 [IU] | Freq: Once | INTRAVENOUS | Status: AC
  Administered 2023-11-20: 500 [IU] via INTRAVENOUS

## 2023-11-20 NOTE — Progress Notes (Signed)
 Patient presents today for Velcade injection.  Patient is in satisfactory condition with no new complaints voiced.  Vital signs are stable.  Labs reviewed and all labs are within treatment parameters.  We will proceed with treatment per MD orders.    Treatment given today per MD orders. Tolerated infusion without adverse affects. Vital signs stable. No complaints at this time. Discharged from clinic ambulatory in stable condition. Alert and oriented x 3. F/U with [facility] as scheduled.

## 2023-11-20 NOTE — Patient Instructions (Signed)
 [facility] [place] - A DEPT OF [facility]  Discharge Instructions: Thank you for choosing [facility] to provide your oncology and hematology care.  If you have a lab appointment with the Cancer Center - please note that after April 8th, 2024, all labs will be drawn in the cancer center.  You do not have to check in or register with the main entrance as you have in the past but will complete your check-in in the cancer center.  Wear comfortable clothing and clothing appropriate for easy access to any Portacath or PICC line.   We strive to give you quality time with your provider. You may need to reschedule your appointment if you arrive late (15 or more minutes).  Arriving late affects you and other patients whose appointments are after yours.  Also, if you miss three or more appointments without notifying the office, you may be dismissed from the clinic at the provider's discretion.      For prescription refill requests, have your pharmacy contact our office and allow 72 hours for refills to be completed.    Today you received the following chemotherapy and/or immunotherapy agents Velcade   To help prevent nausea and vomiting after your treatment, we encourage you to take your nausea medication as directed.  Bortezomib Injection What is this medication? BORTEZOMIB (bor TEZ oh mib) treats lymphoma. It may also be used to treat multiple myeloma, a type of bone marrow cancer. It works by blocking a protein that causes cancer cells to grow and multiply. This helps to slow or stop the spread of cancer cells. This medicine may be used for other purposes; ask your health care provider or pharmacist if you have questions. COMMON BRAND NAME(S): BORUZU, Velcade What should I tell my care team before I take this medication? They need to know if you have any of these conditions: Dehydration Diabetes Heart disease Liver disease Tingling of the fingers or toes or  other nerve disorder An unusual or allergic reaction to bortezomib, other medications, foods, dyes, or preservatives If you or your partner are pregnant or trying to get pregnant Breastfeeding How should I use this medication? This medication is injected into a vein or under the skin. It is given by your care team in a hospital or clinic setting. Talk to your care team about the use of this medication in children. Special care may be needed. Overdosage: If you think you have taken too much of this medicine contact a poison control center or emergency room at once. NOTE: This medicine is only for you. Do not share this medicine with others. What if I miss a dose? Keep appointments for follow-up doses. It is important not to miss your dose. Call your care team if you are unable to keep an appointment. What may interact with this medication? Ketoconazole Rifampin This list may not describe all possible interactions. Give your health care provider a list of all the medicines, herbs, non-prescription drugs, or dietary supplements you use. Also tell them if you smoke, drink alcohol, or use illegal drugs. Some items may interact with your medicine. What should I watch for while using this medication? Your condition will be monitored carefully while you are receiving this medication. You may need blood work while taking this medication. This medication may affect your coordination, reaction time, or judgment. Do not drive or operate machinery until you know how this medication affects you. Sit up or stand slowly to  reduce the risk of dizzy or fainting spells. Drinking alcohol with this medication can increase the risk of these side effects. This medication may increase your risk of getting an infection. Call your care team for advice if you get a fever, chills, sore throat, or other symptoms of a cold or flu. Do not treat yourself. Try to avoid being around people who are sick. Check with your care team  if you have severe diarrhea, nausea, and vomiting, or if you sweat a lot. The loss of too much body fluid may make it dangerous for you to take this medication. Talk to your care team if you may be pregnant. Serious birth defects can occur if you take this medication during pregnancy and for 7 months after the last dose. You will need a negative pregnancy test before starting this medication. Contraception is recommended while taking this medication and for 7 months after the last dose. Your care team can help you find the option that works for you. If your partner can get pregnant, use a condom during sex while taking this medication and for 4 months after the last dose. Do not breastfeed while taking this medication and for 2 months after the last dose. This medication may cause infertility. Talk to your care team if you are concerned about your fertility. What side effects may I notice from receiving this medication? Side effects that you should report to your care team as soon as possible: Allergic reactions--skin rash, itching, hives, swelling of the face, lips, tongue, or throat Bleeding--bloody or black, tar-like stools, vomiting blood or brown material that looks like coffee grounds, red or dark brown urine, small red or purple spots on skin, unusual bruising or bleeding Bleeding in the brain--severe headache, stiff neck, confusion, dizziness, change in vision, numbness or weakness of the face, arm, or leg, trouble speaking, trouble walking, vomiting Bowel blockage--stomach cramping, unable to have a bowel movement or pass gas, loss of appetite, vomiting Heart failure--shortness of breath, swelling of the ankles, feet, or hands, sudden weight gain, unusual weakness or fatigue Infection--fever, chills, cough, sore throat, wounds that don't heal, pain or trouble when passing urine, general feeling of discomfort or being unwell Liver injury--right upper belly pain, loss of appetite, nausea,  light-colored stool, dark yellow or brown urine, yellowing skin or eyes, unusual weakness or fatigue Low blood pressure--dizziness, feeling faint or lightheaded, blurry vision Lung injury--shortness of breath or trouble breathing, cough, spitting up blood, chest pain, fever Pain, tingling, or numbness in the hands or feet Severe or prolonged diarrhea Stomach pain, bloody diarrhea, pale skin, unusual weakness or fatigue, decrease in the amount of urine, which may be signs of hemolytic uremic syndrome Sudden and severe headache, confusion, change in vision, seizures, which may be signs of posterior reversible encephalopathy syndrome (PRES) TTP--purple spots on the skin or inside the mouth, pale skin, yellowing skin or eyes, unusual weakness or fatigue, fever, fast or irregular heartbeat, confusion, change in vision, trouble speaking, trouble walking Tumor lysis syndrome (TLS)--nausea, vomiting, diarrhea, decrease in the amount of urine, dark urine, unusual weakness or fatigue, confusion, muscle pain or cramps, fast or irregular heartbeat, joint pain Side effects that usually do not require medical attention (report to your care team if they continue or are bothersome): Constipation Diarrhea Fatigue Loss of appetite Nausea This list may not describe all possible side effects. Call your doctor for medical advice about side effects. You may report side effects to FDA at 1-800-FDA-1088. Where should I keep  my medication? This medication is given in a hospital or clinic. It will not be stored at home. NOTE: This sheet is a summary. It may not cover all possible information. If you have questions about this medicine, talk to your doctor, pharmacist, or health care provider.  2024 Elsevier/Gold Standard (2022-01-09 00:00:00)  BELOW ARE SYMPTOMS THAT SHOULD BE REPORTED IMMEDIATELY: *FEVER GREATER THAN 100.4 F (38 C) OR HIGHER *CHILLS OR SWEATING *NAUSEA AND VOMITING THAT IS NOT CONTROLLED WITH YOUR  NAUSEA MEDICATION *UNUSUAL SHORTNESS OF BREATH *UNUSUAL BRUISING OR BLEEDING *URINARY PROBLEMS (pain or burning when urinating, or frequent urination) *BOWEL PROBLEMS (unusual diarrhea, constipation, pain near the anus) TENDERNESS IN MOUTH AND THROAT WITH OR WITHOUT PRESENCE OF ULCERS (sore throat, sores in mouth, or a toothache) UNUSUAL RASH, SWELLING OR PAIN  UNUSUAL VAGINAL DISCHARGE OR ITCHING   Items with * indicate a potential emergency and should be followed up as soon as possible or go to the Emergency Department if any problems should occur.  Please show the CHEMOTHERAPY ALERT CARD or IMMUNOTHERAPY ALERT CARD at check-in to the Emergency Department and triage nurse.  Should you have questions after your visit or need to cancel or reschedule your appointment, please contact [facility] [place] - A DEPT OF [facility] [phone]  and follow the prompts.  Office hours are 8:00 a.m. to 4:30 p.m. Monday - Friday. Please note that voicemails left after 4:00 p.m. may not be returned until the following business day.  We are closed weekends and major holidays. You have access to a nurse at all times for urgent questions. Please call the main number to the clinic [phone] and follow the prompts.  For any non-urgent questions, you may also contact your provider using MyChart. We now offer e-Visits for anyone 40 and older to request care online for non-urgent symptoms. For details visit [email or web address].   Also download the MyChart app! Go to the app store, search "MyChart", open the app, select [place], and log in with your MyChart username and password.

## 2023-12-02 ENCOUNTER — Other Ambulatory Visit: Payer: Self-pay | Admitting: Hematology

## 2023-12-04 ENCOUNTER — Inpatient Hospital Stay: Admitting: Hematology

## 2023-12-04 ENCOUNTER — Inpatient Hospital Stay

## 2023-12-04 ENCOUNTER — Encounter: Payer: Self-pay | Admitting: Hematology

## 2023-12-04 DIAGNOSIS — C9000 Multiple myeloma not having achieved remission: Secondary | ICD-10-CM

## 2023-12-04 DIAGNOSIS — Z5112 Encounter for antineoplastic immunotherapy: Secondary | ICD-10-CM | POA: Diagnosis not present

## 2023-12-04 LAB — COMPREHENSIVE METABOLIC PANEL WITH GFR
ALT: 38 U/L (ref 0–44)
AST: 32 U/L (ref 15–41)
Albumin: 3.9 g/dL (ref 3.5–5.0)
Alkaline Phosphatase: 100 U/L (ref 38–126)
Anion gap: 10 (ref 5–15)
BUN: 33 mg/dL — ABNORMAL HIGH (ref 8–23)
CO2: 28 mmol/L (ref 22–32)
Calcium: 8.9 mg/dL (ref 8.9–10.3)
Chloride: 100 mmol/L (ref 98–111)
Creatinine, Ser: 1.69 mg/dL — ABNORMAL HIGH (ref 0.44–1.00)
GFR, Estimated: 31 mL/min — ABNORMAL LOW (ref >60)
Glucose, Bld: 187 mg/dL — ABNORMAL HIGH (ref 70–99)
Potassium: 4.4 mmol/L (ref 3.5–5.1)
Sodium: 138 mmol/L (ref 135–145)
Total Bilirubin: 0.5 mg/dL (ref 0.0–1.2)
Total Protein: 6.4 g/dL — ABNORMAL LOW (ref 6.5–8.1)

## 2023-12-04 LAB — CBC WITH DIFFERENTIAL/PLATELET
Abs Immature Granulocytes: 0.02 10*3/uL (ref 0.00–0.07)
Basophils Absolute: 0 10*3/uL (ref 0.0–0.1)
Basophils Relative: 1 %
Eosinophils Absolute: 0.2 10*3/uL (ref 0.0–0.5)
Eosinophils Relative: 6 %
HCT: 32.5 % — ABNORMAL LOW (ref 36.0–46.0)
Hemoglobin: 10.1 g/dL — ABNORMAL LOW (ref 12.0–15.0)
Immature Granulocytes: 1 %
Lymphocytes Relative: 16 %
Lymphs Abs: 0.6 10*3/uL — ABNORMAL LOW (ref 0.7–4.0)
MCH: 31.1 pg (ref 26.0–34.0)
MCHC: 31.1 g/dL (ref 30.0–36.0)
MCV: 100 fL (ref 80.0–100.0)
Monocytes Absolute: 0.4 10*3/uL (ref 0.1–1.0)
Monocytes Relative: 10 %
Neutro Abs: 2.4 10*3/uL (ref 1.7–7.7)
Neutrophils Relative %: 66 %
Platelets: 151 10*3/uL (ref 150–400)
RBC: 3.25 MIL/uL — ABNORMAL LOW (ref 3.87–5.11)
RDW: 13.2 % (ref 11.5–15.5)
WBC: 3.6 10*3/uL — ABNORMAL LOW (ref 4.0–10.5)
nRBC: 0 % (ref 0.0–0.2)

## 2023-12-04 LAB — MAGNESIUM: Magnesium: 2.1 mg/dL (ref 1.7–2.4)

## 2023-12-04 MED ORDER — DARATUMUMAB-HYALURONIDASE-FIHJ 1800-30000 MG-UT/15ML ~~LOC~~ SOLN
1800.0000 mg | Freq: Once | SUBCUTANEOUS | Status: AC
  Administered 2023-12-04: 1800 mg via SUBCUTANEOUS
  Filled 2023-12-04: qty 15

## 2023-12-04 MED ORDER — HEPARIN SOD (PORK) LOCK FLUSH 100 UNIT/ML IV SOLN
500.0000 [IU] | Freq: Once | INTRAVENOUS | Status: AC
  Administered 2023-12-04: 500 [IU] via INTRAVENOUS

## 2023-12-04 MED ORDER — BORTEZOMIB CHEMO SQ INJECTION 3.5 MG (2.5MG/ML)
1.0000 mg/m2 | Freq: Once | INTRAMUSCULAR | Status: AC
  Administered 2023-12-04: 2 mg via SUBCUTANEOUS
  Filled 2023-12-04: qty 0.8

## 2023-12-04 MED ORDER — SODIUM CHLORIDE FLUSH 0.9 % IV SOLN
10.0000 mL | Freq: Once | INTRAVENOUS | Status: AC
  Administered 2023-12-04: 10 mL via INTRAVENOUS
  Filled 2023-12-04: qty 10

## 2023-12-04 NOTE — Progress Notes (Signed)
 Port flushed with good blood return noted. No bruising or swelling at site. Bandaid applied and patient sent to waiting room.

## 2023-12-04 NOTE — Progress Notes (Signed)
 Patient presents today for Velcade + Darzalex Faspro injection. Vital signs and lab work are within parameters for treatment. MAR reviewed and updated. Creatinine 1.69. Nursing communication creatinine >2 are the parameters for this patient. Patient took all premedications prior to arrival.   Treatment given today per MD orders. Tolerated without adverse affects. Vital signs stable. No complaints at this time. Discharged from clinic ambulatory in stable condition. Alert and oriented x 3. F/U with [facility] as scheduled.

## 2023-12-04 NOTE — Patient Instructions (Signed)
 [facility] [place] - A DEPT OF [facility]  Discharge Instructions: Thank you for choosing [facility] to provide your oncology and hematology care.  If you have a lab appointment with the Cancer Center - please note that after April 8th, 2024, all labs will be drawn in the cancer center.  You do not have to check in or register with the main entrance as you have in the past but will complete your check-in in the cancer center.  Wear comfortable clothing and clothing appropriate for easy access to any Portacath or PICC line.   We strive to give you quality time with your provider. You may need to reschedule your appointment if you arrive late (15 or more minutes).  Arriving late affects you and other patients whose appointments are after yours.  Also, if you miss three or more appointments without notifying the office, you may be dismissed from the clinic at the provider's discretion.      For prescription refill requests, have your pharmacy contact our office and allow 72 hours for refills to be completed.    Today you received the following chemotherapy and/or immunotherapy agents Darzalex Faspro and Vecade injection. Bortezomib Injection What is this medication? BORTEZOMIB (bor TEZ oh mib) treats lymphoma. It may also be used to treat multiple myeloma, a type of bone marrow cancer. It works by blocking a protein that causes cancer cells to grow and multiply. This helps to slow or stop the spread of cancer cells. This medicine may be used for other purposes; ask your health care provider or pharmacist if you have questions. COMMON BRAND NAME(S): BORUZU, Velcade What should I tell my care team before I take this medication? They need to know if you have any of these conditions: Dehydration Diabetes Heart disease Liver disease Tingling of the fingers or toes or other nerve disorder An unusual or allergic reaction to bortezomib, other medications, foods,  dyes, or preservatives If you or your partner are pregnant or trying to get pregnant Breastfeeding How should I use this medication? This medication is injected into a vein or under the skin. It is given by your care team in a hospital or clinic setting. Talk to your care team about the use of this medication in children. Special care may be needed. Overdosage: If you think you have taken too much of this medicine contact a poison control center or emergency room at once. NOTE: This medicine is only for you. Do not share this medicine with others. What if I miss a dose? Keep appointments for follow-up doses. It is important not to miss your dose. Call your care team if you are unable to keep an appointment. What may interact with this medication? Ketoconazole Rifampin This list may not describe all possible interactions. Give your health care provider a list of all the medicines, herbs, non-prescription drugs, or dietary supplements you use. Also tell them if you smoke, drink alcohol, or use illegal drugs. Some items may interact with your medicine. What should I watch for while using this medication? Your condition will be monitored carefully while you are receiving this medication. You may need blood work while taking this medication. This medication may affect your coordination, reaction time, or judgment. Do not drive or operate machinery until you know how this medication affects you. Sit up or stand slowly to reduce the risk of dizzy or fainting spells. Drinking alcohol with this medication can increase the risk of  these side effects. This medication may increase your risk of getting an infection. Call your care team for advice if you get a fever, chills, sore throat, or other symptoms of a cold or flu. Do not treat yourself. Try to avoid being around people who are sick. Check with your care team if you have severe diarrhea, nausea, and vomiting, or if you sweat a lot. The loss of too much  body fluid may make it dangerous for you to take this medication. Talk to your care team if you may be pregnant. Serious birth defects can occur if you take this medication during pregnancy and for 7 months after the last dose. You will need a negative pregnancy test before starting this medication. Contraception is recommended while taking this medication and for 7 months after the last dose. Your care team can help you find the option that works for you. If your partner can get pregnant, use a condom during sex while taking this medication and for 4 months after the last dose. Do not breastfeed while taking this medication and for 2 months after the last dose. This medication may cause infertility. Talk to your care team if you are concerned about your fertility. What side effects may I notice from receiving this medication? Side effects that you should report to your care team as soon as possible: Allergic reactions--skin rash, itching, hives, swelling of the face, lips, tongue, or throat Bleeding--bloody or black, tar-like stools, vomiting blood or brown material that looks like coffee grounds, red or dark brown urine, small red or purple spots on skin, unusual bruising or bleeding Bleeding in the brain--severe headache, stiff neck, confusion, dizziness, change in vision, numbness or weakness of the face, arm, or leg, trouble speaking, trouble walking, vomiting Bowel blockage--stomach cramping, unable to have a bowel movement or pass gas, loss of appetite, vomiting Heart failure--shortness of breath, swelling of the ankles, feet, or hands, sudden weight gain, unusual weakness or fatigue Infection--fever, chills, cough, sore throat, wounds that don't heal, pain or trouble when passing urine, general feeling of discomfort or being unwell Liver injury--right upper belly pain, loss of appetite, nausea, light-colored stool, dark yellow or brown urine, yellowing skin or eyes, unusual weakness or  fatigue Low blood pressure--dizziness, feeling faint or lightheaded, blurry vision Lung injury--shortness of breath or trouble breathing, cough, spitting up blood, chest pain, fever Pain, tingling, or numbness in the hands or feet Severe or prolonged diarrhea Stomach pain, bloody diarrhea, pale skin, unusual weakness or fatigue, decrease in the amount of urine, which may be signs of hemolytic uremic syndrome Sudden and severe headache, confusion, change in vision, seizures, which may be signs of posterior reversible encephalopathy syndrome (PRES) TTP--purple spots on the skin or inside the mouth, pale skin, yellowing skin or eyes, unusual weakness or fatigue, fever, fast or irregular heartbeat, confusion, change in vision, trouble speaking, trouble walking Tumor lysis syndrome (TLS)--nausea, vomiting, diarrhea, decrease in the amount of urine, dark urine, unusual weakness or fatigue, confusion, muscle pain or cramps, fast or irregular heartbeat, joint pain Side effects that usually do not require medical attention (report to your care team if they continue or are bothersome): Constipation Diarrhea Fatigue Loss of appetite Nausea This list may not describe all possible side effects. Call your doctor for medical advice about side effects. You may report side effects to FDA at 1-800-FDA-1088. Where should I keep my medication? This medication is given in a hospital or clinic. It will not be stored at home.  NOTE: This sheet is a summary. It may not cover all possible information. If you have questions about this medicine, talk to your doctor, pharmacist, or health care provider.  2024 Elsevier/Gold Standard (2022-01-09 00:00:00)Daratumumab; Hyaluronidase Injection What is this medication? DARATUMUMAB; HYALURONIDASE (dar a toom ue mab; hye al ur ON i dase) treats multiple myeloma, a type of bone marrow cancer. Daratumumab works by blocking a protein that causes cancer cells to grow and multiply.  This helps to slow or stop the spread of cancer cells. Hyaluronidase works by increasing the absorption of other medications in the body to help them work better. This medication may also be used treat amyloidosis, a condition that causes the buildup of a protein (amyloid) in your body. It works by reducing the buildup of this protein, which decreases symptoms. It is a combination medication that contains a monoclonal antibody. This medicine may be used for other purposes; ask your health care provider or pharmacist if you have questions. COMMON BRAND NAME(S): DARZALEX FASPRO What should I tell my care team before I take this medication? They need to know if you have any of these conditions: Heart disease Infection, such as chickenpox, cold sores, herpes, hepatitis B Lung or breathing disease An unusual or allergic reaction to daratumumab, hyaluronidase, other medications, foods, dyes, or preservatives Pregnant or trying to get pregnant Breast-feeding How should I use this medication? This medication is injected under the skin. It is given by your care team in a hospital or clinic setting. Talk to your care team about the use of this medication in children. Special care may be needed. Overdosage: If you think you have taken too much of this medicine contact a poison control center or emergency room at once. NOTE: This medicine is only for you. Do not share this medicine with others. What if I miss a dose? Keep appointments for follow-up doses. It is important not to miss your dose. Call your care team if you are unable to keep an appointment. What may interact with this medication? Interactions have not been studied. This list may not describe all possible interactions. Give your health care provider a list of all the medicines, herbs, non-prescription drugs, or dietary supplements you use. Also tell them if you smoke, drink alcohol, or use illegal drugs. Some items may interact with your  medicine. What should I watch for while using this medication? Your condition will be monitored carefully while you are receiving this medication. This medication can cause serious allergic reactions. To reduce your risk, your care team may give you other medication to take before receiving this one. Be sure to follow the directions from your care team. This medication can affect the results of blood tests to match your blood type. These changes can last for up to 6 months after the final dose. Your care team will do blood tests to match your blood type before you start treatment. Tell all of your care team that you are being treated with this medication before receiving a blood transfusion. This medication can affect the results of some tests used to determine treatment response; extra tests may be needed to evaluate response. Talk to your care team if you wish to become pregnant or think you are pregnant. This medication can cause serious birth defects if taken during pregnancy and for 3 months after the last dose. A reliable form of contraception is recommended while taking this medication and for 3 months after the last dose. Talk to your care team  about effective forms of contraception. Do not breast-feed while taking this medication. What side effects may I notice from receiving this medication? Side effects that you should report to your care team as soon as possible: Allergic reactions--skin rash, itching, hives, swelling of the face, lips, tongue, or throat Heart rhythm changes--fast or irregular heartbeat, dizziness, feeling faint or lightheaded, chest pain, trouble breathing Infection--fever, chills, cough, sore throat, wounds that don't heal, pain or trouble when passing urine, general feeling of discomfort or being unwell Infusion reactions--chest pain, shortness of breath or trouble breathing, feeling faint or lightheaded Sudden eye pain or change in vision such as blurry vision, seeing  halos around lights, vision loss Unusual bruising or bleeding Side effects that usually do not require medical attention (report to your care team if they continue or are bothersome): Constipation Diarrhea Fatigue Nausea Pain, tingling, or numbness in the hands or feet Swelling of the ankles, hands, or feet This list may not describe all possible side effects. Call your doctor for medical advice about side effects. You may report side effects to FDA at 1-800-FDA-1088. Where should I keep my medication? This medication is given in a hospital or clinic. It will not be stored at home. NOTE: This sheet is a summary. It may not cover all possible information. If you have questions about this medicine, talk to your doctor, pharmacist, or health care provider.  2024 Elsevier/Gold Standard (2021-12-12 00:00:00)      To help prevent nausea and vomiting after your treatment, we encourage you to take your nausea medication as directed.  BELOW ARE SYMPTOMS THAT SHOULD BE REPORTED IMMEDIATELY: *FEVER GREATER THAN 100.4 F (38 C) OR HIGHER *CHILLS OR SWEATING *NAUSEA AND VOMITING THAT IS NOT CONTROLLED WITH YOUR NAUSEA MEDICATION *UNUSUAL SHORTNESS OF BREATH *UNUSUAL BRUISING OR BLEEDING *URINARY PROBLEMS (pain or burning when urinating, or frequent urination) *BOWEL PROBLEMS (unusual diarrhea, constipation, pain near the anus) TENDERNESS IN MOUTH AND THROAT WITH OR WITHOUT PRESENCE OF ULCERS (sore throat, sores in mouth, or a toothache) UNUSUAL RASH, SWELLING OR PAIN  UNUSUAL VAGINAL DISCHARGE OR ITCHING   Items with * indicate a potential emergency and should be followed up as soon as possible or go to the Emergency Department if any problems should occur.  Please show the CHEMOTHERAPY ALERT CARD or IMMUNOTHERAPY ALERT CARD at check-in to the Emergency Department and triage nurse.  Should you have questions after your visit or need to cancel or reschedule your appointment, please contact [facility]  [facility] [place] - A DEPT OF Eligha Bridegroom [facility] [phone]  and follow the prompts.  Office hours are 8:00 a.m. to 4:30 p.m. Monday - Friday. Please note that voicemails left after 4:00 p.m. may not be returned until the following business day.  We are closed weekends and major holidays. You have access to a nurse at all times for urgent questions. Please call the main number to the clinic [phone] and follow the prompts.  For any non-urgent questions, you may also contact your provider using MyChart. We now offer e-Visits for anyone 5 and older to request care online for non-urgent symptoms. For details visit [email or web address].   Also download the MyChart app! Go to the app store, search "MyChart", open the app, select [place], and log in with your MyChart username and password.

## 2023-12-09 ENCOUNTER — Other Ambulatory Visit: Payer: Self-pay | Admitting: *Deleted

## 2023-12-09 DIAGNOSIS — C9000 Multiple myeloma not having achieved remission: Secondary | ICD-10-CM

## 2023-12-09 MED ORDER — HYDROCODONE-ACETAMINOPHEN 10-325 MG PO TABS
1.0000 | ORAL_TABLET | Freq: Four times a day (QID) | ORAL | 0 refills | Status: DC | PRN
Start: 2023-12-09 — End: 2024-01-07

## 2023-12-10 ENCOUNTER — Other Ambulatory Visit: Payer: Self-pay | Admitting: *Deleted

## 2023-12-10 MED ORDER — FUROSEMIDE 20 MG PO TABS: 20.0000 mg | ORAL_TABLET | ORAL | 2 refills | Status: DC | PRN

## 2023-12-10 MED ORDER — GABAPENTIN 300 MG PO CAPS
300.0000 mg | ORAL_CAPSULE | Freq: Three times a day (TID) | ORAL | 0 refills | Status: DC
Start: 2023-12-10 — End: 2024-01-20

## 2023-12-15 ENCOUNTER — Other Ambulatory Visit: Payer: Self-pay

## 2023-12-18 ENCOUNTER — Inpatient Hospital Stay

## 2023-12-18 ENCOUNTER — Inpatient Hospital Stay: Admitting: Hematology

## 2023-12-18 VITALS — BP 135/79 | HR 77 | Temp 97.2°F | Resp 20 | Wt 187.4 lb

## 2023-12-18 DIAGNOSIS — C9000 Multiple myeloma not having achieved remission: Secondary | ICD-10-CM

## 2023-12-18 DIAGNOSIS — Z5112 Encounter for antineoplastic immunotherapy: Secondary | ICD-10-CM | POA: Diagnosis not present

## 2023-12-18 DIAGNOSIS — Z95828 Presence of other vascular implants and grafts: Secondary | ICD-10-CM

## 2023-12-18 LAB — CBC WITH DIFFERENTIAL/PLATELET
Abs Immature Granulocytes: 0.01 10*3/uL (ref 0.00–0.07)
Basophils Absolute: 0 10*3/uL (ref 0.0–0.1)
Basophils Relative: 1 %
Eosinophils Absolute: 0.2 10*3/uL (ref 0.0–0.5)
Eosinophils Relative: 3 %
HCT: 32.7 % — ABNORMAL LOW (ref 36.0–46.0)
Hemoglobin: 10.1 g/dL — ABNORMAL LOW (ref 12.0–15.0)
Immature Granulocytes: 0 %
Lymphocytes Relative: 13 %
Lymphs Abs: 0.6 10*3/uL — ABNORMAL LOW (ref 0.7–4.0)
MCH: 31.1 pg (ref 26.0–34.0)
MCHC: 30.9 g/dL (ref 30.0–36.0)
MCV: 100.6 fL — ABNORMAL HIGH (ref 80.0–100.0)
Monocytes Absolute: 0.4 10*3/uL (ref 0.1–1.0)
Monocytes Relative: 8 %
Neutro Abs: 3.3 10*3/uL (ref 1.7–7.7)
Neutrophils Relative %: 75 %
Platelets: 134 10*3/uL — ABNORMAL LOW (ref 150–400)
RBC: 3.25 MIL/uL — ABNORMAL LOW (ref 3.87–5.11)
RDW: 13 % (ref 11.5–15.5)
WBC: 4.5 10*3/uL (ref 4.0–10.5)
nRBC: 0 % (ref 0.0–0.2)

## 2023-12-18 LAB — COMPREHENSIVE METABOLIC PANEL WITH GFR
ALT: 32 U/L (ref 0–44)
AST: 28 U/L (ref 15–41)
Albumin: 3.9 g/dL (ref 3.5–5.0)
Alkaline Phosphatase: 99 U/L (ref 38–126)
Anion gap: 9 (ref 5–15)
BUN: 26 mg/dL — ABNORMAL HIGH (ref 8–23)
CO2: 28 mmol/L (ref 22–32)
Calcium: 9 mg/dL (ref 8.9–10.3)
Chloride: 100 mmol/L (ref 98–111)
Creatinine, Ser: 1.52 mg/dL — ABNORMAL HIGH (ref 0.44–1.00)
GFR, Estimated: 36 mL/min — ABNORMAL LOW (ref >60)
Glucose, Bld: 185 mg/dL — ABNORMAL HIGH (ref 70–99)
Potassium: 4.2 mmol/L (ref 3.5–5.1)
Sodium: 137 mmol/L (ref 135–145)
Total Bilirubin: 0.6 mg/dL (ref 0.0–1.2)
Total Protein: 6.4 g/dL — ABNORMAL LOW (ref 6.5–8.1)

## 2023-12-18 MED ORDER — SODIUM CHLORIDE 0.9% FLUSH
10.0000 mL | Freq: Once | INTRAVENOUS | Status: AC
  Administered 2023-12-18: 10 mL via INTRAVENOUS

## 2023-12-18 MED ORDER — BORTEZOMIB CHEMO SQ INJECTION 3.5 MG (2.5MG/ML)
1.0000 mg/m2 | Freq: Once | INTRAMUSCULAR | Status: AC
  Administered 2023-12-18: 2 mg via SUBCUTANEOUS
  Filled 2023-12-18: qty 0.8

## 2023-12-18 MED ORDER — HEPARIN SOD (PORK) LOCK FLUSH 100 UNIT/ML IV SOLN
500.0000 [IU] | Freq: Once | INTRAVENOUS | Status: AC
  Administered 2023-12-18: 500 [IU] via INTRAVENOUS

## 2023-12-18 NOTE — Progress Notes (Signed)
 Patient presents today for Velcade  injection. D15 Velcade  only. Vital signs within parameters for treatment. Labs pending. MAR reviewed and updated.   Labs resulted and within parameters for treatment. Creatinine 1.52. Treatment parameters met per treatment plan.   Treatment given today per MD orders. Tolerated without adverse affects. Vital signs stable. No complaints at this time. Discharged from clinic ambulatory in stable condition. Alert and oriented x 3. F/U with [facility] as scheduled.

## 2023-12-18 NOTE — Patient Instructions (Signed)
 [facility] [place] - A DEPT OF [facility]  Discharge Instructions: Thank you for choosing [facility] to provide your oncology and hematology care.  If you have a lab appointment with the Cancer Center - please note that after April 8th, 2024, all labs will be drawn in the cancer center.  You do not have to check in or register with the main entrance as you have in the past but will complete your check-in in the cancer center.  Wear comfortable clothing and clothing appropriate for easy access to any Portacath or PICC line.   We strive to give you quality time with your provider. You may need to reschedule your appointment if you arrive late (15 or more minutes).  Arriving late affects you and other patients whose appointments are after yours.  Also, if you miss three or more appointments without notifying the office, you may be dismissed from the clinic at the provider's discretion.      For prescription refill requests, have your pharmacy contact our office and allow 72 hours for refills to be completed.    Today you received the following chemotherapy and/or immunotherapy agents Velcade.  Bortezomib Injection What is this medication? BORTEZOMIB (bor TEZ oh mib) treats lymphoma. It may also be used to treat multiple myeloma, a type of bone marrow cancer. It works by blocking a protein that causes cancer cells to grow and multiply. This helps to slow or stop the spread of cancer cells. This medicine may be used for other purposes; ask your health care provider or pharmacist if you have questions. COMMON BRAND NAME(S): BORUZU, Velcade What should I tell my care team before I take this medication? They need to know if you have any of these conditions: Dehydration Diabetes Heart disease Liver disease Tingling of the fingers or toes or other nerve disorder An unusual or allergic reaction to bortezomib, other medications, foods, dyes, or preservatives If  you or your partner are pregnant or trying to get pregnant Breastfeeding How should I use this medication? This medication is injected into a vein or under the skin. It is given by your care team in a hospital or clinic setting. Talk to your care team about the use of this medication in children. Special care may be needed. Overdosage: If you think you have taken too much of this medicine contact a poison control center or emergency room at once. NOTE: This medicine is only for you. Do not share this medicine with others. What if I miss a dose? Keep appointments for follow-up doses. It is important not to miss your dose. Call your care team if you are unable to keep an appointment. What may interact with this medication? Ketoconazole Rifampin This list may not describe all possible interactions. Give your health care provider a list of all the medicines, herbs, non-prescription drugs, or dietary supplements you use. Also tell them if you smoke, drink alcohol, or use illegal drugs. Some items may interact with your medicine. What should I watch for while using this medication? Your condition will be monitored carefully while you are receiving this medication. You may need blood work while taking this medication. This medication may affect your coordination, reaction time, or judgment. Do not drive or operate machinery until you know how this medication affects you. Sit up or stand slowly to reduce the risk of dizzy or fainting spells. Drinking alcohol with this medication can increase the risk of these side effects.  This medication may increase your risk of getting an infection. Call your care team for advice if you get a fever, chills, sore throat, or other symptoms of a cold or flu. Do not treat yourself. Try to avoid being around people who are sick. Check with your care team if you have severe diarrhea, nausea, and vomiting, or if you sweat a lot. The loss of too much body fluid may make it  dangerous for you to take this medication. Talk to your care team if you may be pregnant. Serious birth defects can occur if you take this medication during pregnancy and for 7 months after the last dose. You will need a negative pregnancy test before starting this medication. Contraception is recommended while taking this medication and for 7 months after the last dose. Your care team can help you find the option that works for you. If your partner can get pregnant, use a condom during sex while taking this medication and for 4 months after the last dose. Do not breastfeed while taking this medication and for 2 months after the last dose. This medication may cause infertility. Talk to your care team if you are concerned about your fertility. What side effects may I notice from receiving this medication? Side effects that you should report to your care team as soon as possible: Allergic reactions--skin rash, itching, hives, swelling of the face, lips, tongue, or throat Bleeding--bloody or black, tar-like stools, vomiting blood or [place] material that looks like coffee grounds, red or dark [place] urine, small red or purple spots on skin, unusual bruising or bleeding Bleeding in the brain--severe headache, stiff neck, confusion, dizziness, change in vision, numbness or weakness of the face, arm, or leg, trouble speaking, trouble walking, vomiting Bowel blockage--stomach cramping, unable to have a bowel movement or pass gas, loss of appetite, vomiting Heart failure--shortness of breath, swelling of the ankles, feet, or hands, sudden weight gain, unusual weakness or fatigue Infection--fever, chills, cough, sore throat, wounds that don't heal, pain or trouble when passing urine, general feeling of discomfort or being unwell Liver injury--right upper belly pain, loss of appetite, nausea, light-colored stool, dark yellow or [place] urine, yellowing skin or eyes, unusual weakness or fatigue Low blood  pressure--dizziness, feeling faint or lightheaded, blurry vision Lung injury--shortness of breath or trouble breathing, cough, spitting up blood, chest pain, fever Pain, tingling, or numbness in the hands or feet Severe or prolonged diarrhea Stomach pain, bloody diarrhea, pale skin, unusual weakness or fatigue, decrease in the amount of urine, which may be signs of hemolytic uremic syndrome Sudden and severe headache, confusion, change in vision, seizures, which may be signs of posterior reversible encephalopathy syndrome (PRES) TTP--purple spots on the skin or inside the mouth, pale skin, yellowing skin or eyes, unusual weakness or fatigue, fever, fast or irregular heartbeat, confusion, change in vision, trouble speaking, trouble walking Tumor lysis syndrome (TLS)--nausea, vomiting, diarrhea, decrease in the amount of urine, dark urine, unusual weakness or fatigue, confusion, muscle pain or cramps, fast or irregular heartbeat, joint pain Side effects that usually do not require medical attention (report to your care team if they continue or are bothersome): Constipation Diarrhea Fatigue Loss of appetite Nausea This list may not describe all possible side effects. Call your doctor for medical advice about side effects. You may report side effects to FDA at 1-800-FDA-1088. Where should I keep my medication? This medication is given in a hospital or clinic. It will not be stored at home. NOTE: This sheet  is a summary. It may not cover all possible information. If you have questions about this medicine, talk to your doctor, pharmacist, or health care provider.  2024 Elsevier/Gold Standard (2022-01-09 00:00:00)       To help prevent nausea and vomiting after your treatment, we encourage you to take your nausea medication as directed.  BELOW ARE SYMPTOMS THAT SHOULD BE REPORTED IMMEDIATELY: *FEVER GREATER THAN 100.4 F (38 C) OR HIGHER *CHILLS OR SWEATING *NAUSEA AND VOMITING THAT IS NOT  CONTROLLED WITH YOUR NAUSEA MEDICATION *UNUSUAL SHORTNESS OF BREATH *UNUSUAL BRUISING OR BLEEDING *URINARY PROBLEMS (pain or burning when urinating, or frequent urination) *BOWEL PROBLEMS (unusual diarrhea, constipation, pain near the anus) TENDERNESS IN MOUTH AND THROAT WITH OR WITHOUT PRESENCE OF ULCERS (sore throat, sores in mouth, or a toothache) UNUSUAL RASH, SWELLING OR PAIN  UNUSUAL VAGINAL DISCHARGE OR ITCHING   Items with * indicate a potential emergency and should be followed up as soon as possible or go to the Emergency Department if any problems should occur.  Please show the CHEMOTHERAPY ALERT CARD or IMMUNOTHERAPY ALERT CARD at check-in to the Emergency Department and triage nurse.  Should you have questions after your visit or need to cancel or reschedule your appointment, please contact [facility]  - A DEPT OF [facility] [phone]  and follow the prompts.  Office hours are 8:00 a.m. to 4:30 p.m. Monday - Friday. Please note that voicemails left after 4:00 p.m. may not be returned until the following business day.  We are closed weekends and major holidays. You have access to a nurse at all times for urgent questions. Please call the main number to the clinic [phone] and follow the prompts.  For any non-urgent questions, you may also contact your provider using MyChart. We now offer e-Visits for anyone 29 and older to request care online for non-urgent symptoms. For details visit [email or web address].   Also download the MyChart app! Go to the app store, search "MyChart", open the app, select [place], and log in with your MyChart username and password.

## 2023-12-29 ENCOUNTER — Other Ambulatory Visit: Payer: Self-pay | Admitting: Hematology

## 2023-12-30 ENCOUNTER — Encounter: Payer: Self-pay | Admitting: Hematology

## 2024-01-01 ENCOUNTER — Inpatient Hospital Stay

## 2024-01-01 ENCOUNTER — Inpatient Hospital Stay: Attending: Hematology

## 2024-01-01 ENCOUNTER — Encounter: Payer: Self-pay | Admitting: Hematology

## 2024-01-01 VITALS — BP 137/72 | HR 70 | Temp 98.0°F | Resp 18 | Wt 189.9 lb

## 2024-01-01 DIAGNOSIS — C9000 Multiple myeloma not having achieved remission: Secondary | ICD-10-CM

## 2024-01-01 DIAGNOSIS — C9002 Multiple myeloma in relapse: Secondary | ICD-10-CM | POA: Insufficient documentation

## 2024-01-01 DIAGNOSIS — Z5112 Encounter for antineoplastic immunotherapy: Secondary | ICD-10-CM | POA: Insufficient documentation

## 2024-01-01 LAB — COMPREHENSIVE METABOLIC PANEL WITH GFR
ALT: 35 U/L (ref 0–44)
AST: 28 U/L (ref 15–41)
Albumin: 3.8 g/dL (ref 3.5–5.0)
Alkaline Phosphatase: 77 U/L (ref 38–126)
Anion gap: 9 (ref 5–15)
BUN: 25 mg/dL — ABNORMAL HIGH (ref 8–23)
CO2: 28 mmol/L (ref 22–32)
Calcium: 8.8 mg/dL — ABNORMAL LOW (ref 8.9–10.3)
Chloride: 103 mmol/L (ref 98–111)
Creatinine, Ser: 1.86 mg/dL — ABNORMAL HIGH (ref 0.44–1.00)
GFR, Estimated: 28 mL/min — ABNORMAL LOW (ref >60)
Glucose, Bld: 141 mg/dL — ABNORMAL HIGH (ref 70–99)
Potassium: 4.7 mmol/L (ref 3.5–5.1)
Sodium: 140 mmol/L (ref 135–145)
Total Bilirubin: 0.6 mg/dL (ref 0.0–1.2)
Total Protein: 6.1 g/dL — ABNORMAL LOW (ref 6.5–8.1)

## 2024-01-01 LAB — CBC WITH DIFFERENTIAL/PLATELET
Abs Immature Granulocytes: 0 10*3/uL (ref 0.00–0.07)
Basophils Absolute: 0 10*3/uL (ref 0.0–0.1)
Basophils Relative: 1 %
Eosinophils Absolute: 0.2 10*3/uL (ref 0.0–0.5)
Eosinophils Relative: 7 %
HCT: 32.1 % — ABNORMAL LOW (ref 36.0–46.0)
Hemoglobin: 10.2 g/dL — ABNORMAL LOW (ref 12.0–15.0)
Immature Granulocytes: 0 %
Lymphocytes Relative: 22 %
Lymphs Abs: 0.7 10*3/uL (ref 0.7–4.0)
MCH: 31.6 pg (ref 26.0–34.0)
MCHC: 31.8 g/dL (ref 30.0–36.0)
MCV: 99.4 fL (ref 80.0–100.0)
Monocytes Absolute: 0.4 10*3/uL (ref 0.1–1.0)
Monocytes Relative: 13 %
Neutro Abs: 1.8 10*3/uL (ref 1.7–7.7)
Neutrophils Relative %: 57 %
Platelets: 143 10*3/uL — ABNORMAL LOW (ref 150–400)
RBC: 3.23 MIL/uL — ABNORMAL LOW (ref 3.87–5.11)
RDW: 13.1 % (ref 11.5–15.5)
WBC: 3.2 10*3/uL — ABNORMAL LOW (ref 4.0–10.5)
nRBC: 0 % (ref 0.0–0.2)

## 2024-01-01 LAB — MAGNESIUM: Magnesium: 2.3 mg/dL (ref 1.7–2.4)

## 2024-01-01 MED ORDER — DARATUMUMAB-HYALURONIDASE-FIHJ 1800-30000 MG-UT/15ML ~~LOC~~ SOLN
1800.0000 mg | Freq: Once | SUBCUTANEOUS | Status: AC
  Administered 2024-01-01: 1800 mg via SUBCUTANEOUS
  Filled 2024-01-01: qty 15

## 2024-01-01 MED ORDER — HEPARIN SOD (PORK) LOCK FLUSH 100 UNIT/ML IV SOLN
500.0000 [IU] | Freq: Once | INTRAVENOUS | Status: AC
  Administered 2024-01-01: 500 [IU] via INTRAVENOUS

## 2024-01-01 MED ORDER — BORTEZOMIB CHEMO SQ INJECTION 3.5 MG (2.5MG/ML)
1.0000 mg/m2 | Freq: Once | INTRAMUSCULAR | Status: AC
  Administered 2024-01-01: 2 mg via SUBCUTANEOUS
  Filled 2024-01-01: qty 0.8

## 2024-01-01 MED ORDER — SODIUM CHLORIDE 0.9% FLUSH
10.0000 mL | Freq: Once | INTRAVENOUS | Status: AC
Start: 2024-01-01 — End: 2024-01-01
  Administered 2024-01-01: 10 mL via INTRAVENOUS

## 2024-01-01 NOTE — Progress Notes (Signed)
 Patient presents today for Daratumumab  and Velcade  injections.  Patient is in satisfactory condition with no new complaints voiced.  Vital signs are stable.  Labs reviewed.  CMP results are pending.   CMP resulted.  Labs are within treatment parameters.  We will proceed with injections per MD orders. Pre-medications taken at 0800 at home prior to visit.    Patient tolerated injections well with no complaints voiced.  Patient left ambulatory in stable condition.  Vital signs stable at discharge.  Follow up as scheduled.

## 2024-01-01 NOTE — Patient Instructions (Signed)
 [facility] [place] - A DEPT OF [facility]  Discharge Instructions: Thank you for choosing [facility] to provide your oncology and hematology care.  If you have a lab appointment with the Cancer Center - please note that after April 8th, 2024, all labs will be drawn in the cancer center.  You do not have to check in or register with the main entrance as you have in the past but will complete your check-in in the cancer center.  Wear comfortable clothing and clothing appropriate for easy access to any Portacath or PICC line.   We strive to give you quality time with your provider. You may need to reschedule your appointment if you arrive late (15 or more minutes).  Arriving late affects you and other patients whose appointments are after yours.  Also, if you miss three or more appointments without notifying the office, you may be dismissed from the clinic at the provider's discretion.      For prescription refill requests, have your pharmacy contact our office and allow 72 hours for refills to be completed.    Today you received the following chemotherapy and/or immunotherapy agents Daratumumab/Velcade.  Daratumumab; Hyaluronidase Injection What is this medication? DARATUMUMAB; HYALURONIDASE (dar a toom ue mab; hye al ur ON i dase) treats multiple myeloma, a type of bone marrow cancer. Daratumumab works by blocking a protein that causes cancer cells to grow and multiply. This helps to slow or stop the spread of cancer cells. Hyaluronidase works by increasing the absorption of other medications in the body to help them work better. This medication may also be used treat amyloidosis, a condition that causes the buildup of a protein (amyloid) in your body. It works by reducing the buildup of this protein, which decreases symptoms. It is a combination medication that contains a monoclonal antibody. This medicine may be used for other purposes; ask your health care  provider or pharmacist if you have questions. COMMON BRAND NAME(S): DARZALEX FASPRO What should I tell my care team before I take this medication? They need to know if you have any of these conditions: Heart disease Infection, such as chickenpox, cold sores, herpes, hepatitis B Lung or breathing disease An unusual or allergic reaction to daratumumab, hyaluronidase, other medications, foods, dyes, or preservatives Pregnant or trying to get pregnant Breast-feeding How should I use this medication? This medication is injected under the skin. It is given by your care team in a hospital or clinic setting. Talk to your care team about the use of this medication in children. Special care may be needed. Overdosage: If you think you have taken too much of this medicine contact a poison control center or emergency room at once. NOTE: This medicine is only for you. Do not share this medicine with others. What if I miss a dose? Keep appointments for follow-up doses. It is important not to miss your dose. Call your care team if you are unable to keep an appointment. What may interact with this medication? Interactions have not been studied. This list may not describe all possible interactions. Give your health care provider a list of all the medicines, herbs, non-prescription drugs, or dietary supplements you use. Also tell them if you smoke, drink alcohol, or use illegal drugs. Some items may interact with your medicine. What should I watch for while using this medication? Your condition will be monitored carefully while you are receiving this medication. This medication can cause serious allergic  reactions. To reduce your risk, your care team may give you other medication to take before receiving this one. Be sure to follow the directions from your care team. This medication can affect the results of blood tests to match your blood type. These changes can last for up to 6 months after the final dose.  Your care team will do blood tests to match your blood type before you start treatment. Tell all of your care team that you are being treated with this medication before receiving a blood transfusion. This medication can affect the results of some tests used to determine treatment response; extra tests may be needed to evaluate response. Talk to your care team if you wish to become pregnant or think you are pregnant. This medication can cause serious birth defects if taken during pregnancy and for 3 months after the last dose. A reliable form of contraception is recommended while taking this medication and for 3 months after the last dose. Talk to your care team about effective forms of contraception. Do not breast-feed while taking this medication. What side effects may I notice from receiving this medication? Side effects that you should report to your care team as soon as possible: Allergic reactions--skin rash, itching, hives, swelling of the face, lips, tongue, or throat Heart rhythm changes--fast or irregular heartbeat, dizziness, feeling faint or lightheaded, chest pain, trouble breathing Infection--fever, chills, cough, sore throat, wounds that don't heal, pain or trouble when passing urine, general feeling of discomfort or being unwell Infusion reactions--chest pain, shortness of breath or trouble breathing, feeling faint or lightheaded Sudden eye pain or change in vision such as blurry vision, seeing halos around lights, vision loss Unusual bruising or bleeding Side effects that usually do not require medical attention (report to your care team if they continue or are bothersome): Constipation Diarrhea Fatigue Nausea Pain, tingling, or numbness in the hands or feet Swelling of the ankles, hands, or feet This list may not describe all possible side effects. Call your doctor for medical advice about side effects. You may report side effects to FDA at 1-800-FDA-1088. Where should I keep my  medication? This medication is given in a hospital or clinic. It will not be stored at home. NOTE: This sheet is a summary. It may not cover all possible information. If you have questions about this medicine, talk to your doctor, pharmacist, or health care provider.  2024 Elsevier/Gold Standard (2021-12-12 00:00:00)   Bortezomib Injection What is this medication? BORTEZOMIB (bor TEZ oh mib) treats lymphoma. It may also be used to treat multiple myeloma, a type of bone marrow cancer. It works by blocking a protein that causes cancer cells to grow and multiply. This helps to slow or stop the spread of cancer cells. This medicine may be used for other purposes; ask your health care provider or pharmacist if you have questions. COMMON BRAND NAME(S): BORUZU, Velcade What should I tell my care team before I take this medication? They need to know if you have any of these conditions: Dehydration Diabetes Heart disease Liver disease Tingling of the fingers or toes or other nerve disorder An unusual or allergic reaction to bortezomib, other medications, foods, dyes, or preservatives If you or your partner are pregnant or trying to get pregnant Breastfeeding How should I use this medication? This medication is injected into a vein or under the skin. It is given by your care team in a hospital or clinic setting. Talk to your care team about the use  of this medication in children. Special care may be needed. Overdosage: If you think you have taken too much of this medicine contact a poison control center or emergency room at once. NOTE: This medicine is only for you. Do not share this medicine with others. What if I miss a dose? Keep appointments for follow-up doses. It is important not to miss your dose. Call your care team if you are unable to keep an appointment. What may interact with this medication? Ketoconazole Rifampin This list may not describe all possible interactions. Give your  health care provider a list of all the medicines, herbs, non-prescription drugs, or dietary supplements you use. Also tell them if you smoke, drink alcohol, or use illegal drugs. Some items may interact with your medicine. What should I watch for while using this medication? Your condition will be monitored carefully while you are receiving this medication. You may need blood work while taking this medication. This medication may affect your coordination, reaction time, or judgment. Do not drive or operate machinery until you know how this medication affects you. Sit up or stand slowly to reduce the risk of dizzy or fainting spells. Drinking alcohol with this medication can increase the risk of these side effects. This medication may increase your risk of getting an infection. Call your care team for advice if you get a fever, chills, sore throat, or other symptoms of a cold or flu. Do not treat yourself. Try to avoid being around people who are sick. Check with your care team if you have severe diarrhea, nausea, and vomiting, or if you sweat a lot. The loss of too much body fluid may make it dangerous for you to take this medication. Talk to your care team if you may be pregnant. Serious birth defects can occur if you take this medication during pregnancy and for 7 months after the last dose. You will need a negative pregnancy test before starting this medication. Contraception is recommended while taking this medication and for 7 months after the last dose. Your care team can help you find the option that works for you. If your partner can get pregnant, use a condom during sex while taking this medication and for 4 months after the last dose. Do not breastfeed while taking this medication and for 2 months after the last dose. This medication may cause infertility. Talk to your care team if you are concerned about your fertility. What side effects may I notice from receiving this medication? Side effects  that you should report to your care team as soon as possible: Allergic reactions--skin rash, itching, hives, swelling of the face, lips, tongue, or throat Bleeding--bloody or black, tar-like stools, vomiting blood or Lakeesha Fontanilla material that looks like coffee grounds, red or dark Celie Desrochers urine, small red or purple spots on skin, unusual bruising or bleeding Bleeding in the brain--severe headache, stiff neck, confusion, dizziness, change in vision, numbness or weakness of the face, arm, or leg, trouble speaking, trouble walking, vomiting Bowel blockage--stomach cramping, unable to have a bowel movement or pass gas, loss of appetite, vomiting Heart failure--shortness of breath, swelling of the ankles, feet, or hands, sudden weight gain, unusual weakness or fatigue Infection--fever, chills, cough, sore throat, wounds that don't heal, pain or trouble when passing urine, general feeling of discomfort or being unwell Liver injury--right upper belly pain, loss of appetite, nausea, light-colored stool, dark yellow or Nivea Wojdyla urine, yellowing skin or eyes, unusual weakness or fatigue Low blood pressure--dizziness, feeling faint or lightheaded,  blurry vision Lung injury--shortness of breath or trouble breathing, cough, spitting up blood, chest pain, fever Pain, tingling, or numbness in the hands or feet Severe or prolonged diarrhea Stomach pain, bloody diarrhea, pale skin, unusual weakness or fatigue, decrease in the amount of urine, which may be signs of hemolytic uremic syndrome Sudden and severe headache, confusion, change in vision, seizures, which may be signs of posterior reversible encephalopathy syndrome (PRES) TTP--purple spots on the skin or inside the mouth, pale skin, yellowing skin or eyes, unusual weakness or fatigue, fever, fast or irregular heartbeat, confusion, change in vision, trouble speaking, trouble walking Tumor lysis syndrome (TLS)--nausea, vomiting, diarrhea, decrease in the amount of urine,  dark urine, unusual weakness or fatigue, confusion, muscle pain or cramps, fast or irregular heartbeat, joint pain Side effects that usually do not require medical attention (report to your care team if they continue or are bothersome): Constipation Diarrhea Fatigue Loss of appetite Nausea This list may not describe all possible side effects. Call your doctor for medical advice about side effects. You may report side effects to FDA at 1-800-FDA-1088. Where should I keep my medication? This medication is given in a hospital or clinic. It will not be stored at home. NOTE: This sheet is a summary. It may not cover all possible information. If you have questions about this medicine, talk to your doctor, pharmacist, or health care provider.  2024 Elsevier/Gold Standard (2022-01-09 00:00:00)       To help prevent nausea and vomiting after your treatment, we encourage you to take your nausea medication as directed.  BELOW ARE SYMPTOMS THAT SHOULD BE REPORTED IMMEDIATELY: *FEVER GREATER THAN 100.4 F (38 C) OR HIGHER *CHILLS OR SWEATING *NAUSEA AND VOMITING THAT IS NOT CONTROLLED WITH YOUR NAUSEA MEDICATION *UNUSUAL SHORTNESS OF BREATH *UNUSUAL BRUISING OR BLEEDING *URINARY PROBLEMS (pain or burning when urinating, or frequent urination) *BOWEL PROBLEMS (unusual diarrhea, constipation, pain near the anus) TENDERNESS IN MOUTH AND THROAT WITH OR WITHOUT PRESENCE OF ULCERS (sore throat, sores in mouth, or a toothache) UNUSUAL RASH, SWELLING OR PAIN  UNUSUAL VAGINAL DISCHARGE OR ITCHING   Items with * indicate a potential emergency and should be followed up as soon as possible or go to the Emergency Department if any problems should occur.  Please show the CHEMOTHERAPY ALERT CARD or IMMUNOTHERAPY ALERT CARD at check-in to the Emergency Department and triage nurse.  Should you have questions after your visit or need to cancel or reschedule your appointment, please contact [facility] [place]  - A DEPT OF [facility] [phone]  and follow the prompts.  Office hours are 8:00 a.m. to 4:30 p.m. Monday - Friday. Please note that voicemails left after 4:00 p.m. may not be returned until the following business day.  We are closed weekends and major holidays. You have access to a nurse at all times for urgent questions. Please call the main number to the clinic [phone] and follow the prompts.  For any non-urgent questions, you may also contact your provider using MyChart. We now offer e-Visits for anyone 13 and older to request care online for non-urgent symptoms. For details visit [email or web address].   Also download the MyChart app! Go to the app store, search "MyChart", open the app, select [place], and log in with your MyChart username and password.

## 2024-01-02 ENCOUNTER — Other Ambulatory Visit: Payer: Self-pay

## 2024-01-07 ENCOUNTER — Other Ambulatory Visit: Payer: Self-pay | Admitting: *Deleted

## 2024-01-07 DIAGNOSIS — C9000 Multiple myeloma not having achieved remission: Secondary | ICD-10-CM

## 2024-01-07 MED ORDER — HYDROCODONE-ACETAMINOPHEN 10-325 MG PO TABS
1.0000 | ORAL_TABLET | Freq: Four times a day (QID) | ORAL | 0 refills | Status: AC | PRN
Start: 2024-01-07 — End: ?

## 2024-01-15 ENCOUNTER — Inpatient Hospital Stay

## 2024-01-15 VITALS — BP 125/76 | HR 73 | Temp 98.0°F | Resp 20 | Wt 185.1 lb

## 2024-01-15 DIAGNOSIS — C9000 Multiple myeloma not having achieved remission: Secondary | ICD-10-CM

## 2024-01-15 DIAGNOSIS — Z95828 Presence of other vascular implants and grafts: Secondary | ICD-10-CM

## 2024-01-15 DIAGNOSIS — Z5112 Encounter for antineoplastic immunotherapy: Secondary | ICD-10-CM | POA: Diagnosis not present

## 2024-01-15 LAB — COMPREHENSIVE METABOLIC PANEL WITH GFR
ALT: 31 U/L (ref 0–44)
AST: 24 U/L (ref 15–41)
Albumin: 3.7 g/dL (ref 3.5–5.0)
Alkaline Phosphatase: 87 U/L (ref 38–126)
Anion gap: 10 (ref 5–15)
BUN: 29 mg/dL — ABNORMAL HIGH (ref 8–23)
CO2: 28 mmol/L (ref 22–32)
Calcium: 9 mg/dL (ref 8.9–10.3)
Chloride: 101 mmol/L (ref 98–111)
Creatinine, Ser: 1.8 mg/dL — ABNORMAL HIGH (ref 0.44–1.00)
GFR, Estimated: 29 mL/min — ABNORMAL LOW (ref >60)
Glucose, Bld: 126 mg/dL — ABNORMAL HIGH (ref 70–99)
Potassium: 4.6 mmol/L (ref 3.5–5.1)
Sodium: 139 mmol/L (ref 135–145)
Total Bilirubin: 0.5 mg/dL (ref 0.0–1.2)
Total Protein: 6.5 g/dL (ref 6.5–8.1)

## 2024-01-15 LAB — CBC WITH DIFFERENTIAL/PLATELET
Abs Immature Granulocytes: 0.01 10*3/uL (ref 0.00–0.07)
Basophils Absolute: 0 10*3/uL (ref 0.0–0.1)
Basophils Relative: 0 %
Eosinophils Absolute: 0.2 10*3/uL (ref 0.0–0.5)
Eosinophils Relative: 4 %
HCT: 33.6 % — ABNORMAL LOW (ref 36.0–46.0)
Hemoglobin: 10.8 g/dL — ABNORMAL LOW (ref 12.0–15.0)
Immature Granulocytes: 0 %
Lymphocytes Relative: 12 %
Lymphs Abs: 0.6 10*3/uL — ABNORMAL LOW (ref 0.7–4.0)
MCH: 32.2 pg (ref 26.0–34.0)
MCHC: 32.1 g/dL (ref 30.0–36.0)
MCV: 100.3 fL — ABNORMAL HIGH (ref 80.0–100.0)
Monocytes Absolute: 0.4 10*3/uL (ref 0.1–1.0)
Monocytes Relative: 7 %
Neutro Abs: 4 10*3/uL (ref 1.7–7.7)
Neutrophils Relative %: 77 %
Platelets: 159 10*3/uL (ref 150–400)
RBC: 3.35 MIL/uL — ABNORMAL LOW (ref 3.87–5.11)
RDW: 13 % (ref 11.5–15.5)
WBC: 5.2 10*3/uL (ref 4.0–10.5)
nRBC: 0 % (ref 0.0–0.2)

## 2024-01-15 LAB — MAGNESIUM: Magnesium: 2 mg/dL (ref 1.7–2.4)

## 2024-01-15 MED ORDER — SODIUM CHLORIDE 0.9% FLUSH
10.0000 mL | INTRAVENOUS | Status: DC | PRN
  Administered 2024-01-15: 10 mL via INTRAVENOUS

## 2024-01-15 MED ORDER — HEPARIN SOD (PORK) LOCK FLUSH 100 UNIT/ML IV SOLN
500.0000 [IU] | Freq: Once | INTRAVENOUS | Status: AC
  Administered 2024-01-15: 500 [IU] via INTRAVENOUS

## 2024-01-15 MED ORDER — BORTEZOMIB CHEMO SQ INJECTION 3.5 MG (2.5MG/ML)
1.0000 mg/m2 | Freq: Once | INTRAMUSCULAR | Status: AC
  Administered 2024-01-15: 2 mg via SUBCUTANEOUS
  Filled 2024-01-15: qty 0.8

## 2024-01-15 NOTE — Patient Instructions (Signed)

## 2024-01-15 NOTE — Progress Notes (Signed)
 Patients port flushed without difficulty.  Good blood return noted with no bruising or swelling noted at site.  Band aid applied.  VSS with discharge and left in satisfactory condition with no s/s of distress noted.

## 2024-01-15 NOTE — Patient Instructions (Signed)
 [facility] [place] - A DEPT OF [facility]  Discharge Instructions: Thank you for choosing [facility] to provide your oncology and hematology care.  If you have a lab appointment with the Cancer Center - please note that after April 8th, 2024, all labs will be drawn in the cancer center.  You do not have to check in or register with the main entrance as you have in the past but will complete your check-in in the cancer center.  Wear comfortable clothing and clothing appropriate for easy access to any Portacath or PICC line.   We strive to give you quality time with your provider. You may need to reschedule your appointment if you arrive late (15 or more minutes).  Arriving late affects you and other patients whose appointments are after yours.  Also, if you miss three or more appointments without notifying the office, you may be dismissed from the clinic at the provider's discretion.      For prescription refill requests, have your pharmacy contact our office and allow 72 hours for refills to be completed.    Today you received the following chemotherapy and/or immunotherapy agents Velcade, return as scheduled.   To help prevent nausea and vomiting after your treatment, we encourage you to take your nausea medication as directed.  BELOW ARE SYMPTOMS THAT SHOULD BE REPORTED IMMEDIATELY: *FEVER GREATER THAN 100.4 F (38 C) OR HIGHER *CHILLS OR SWEATING *NAUSEA AND VOMITING THAT IS NOT CONTROLLED WITH YOUR NAUSEA MEDICATION *UNUSUAL SHORTNESS OF BREATH *UNUSUAL BRUISING OR BLEEDING *URINARY PROBLEMS (pain or burning when urinating, or frequent urination) *BOWEL PROBLEMS (unusual diarrhea, constipation, pain near the anus) TENDERNESS IN MOUTH AND THROAT WITH OR WITHOUT PRESENCE OF ULCERS (sore throat, sores in mouth, or a toothache) UNUSUAL RASH, SWELLING OR PAIN  UNUSUAL VAGINAL DISCHARGE OR ITCHING   Items with * indicate a potential emergency and  should be followed up as soon as possible or go to the Emergency Department if any problems should occur.  Please show the CHEMOTHERAPY ALERT CARD or IMMUNOTHERAPY ALERT CARD at check-in to the Emergency Department and triage nurse.  Should you have questions after your visit or need to cancel or reschedule your appointment, please contact [facility] [place] - A DEPT OF [facility]ideg[facility] [phone]  and follow the prompts.  Office hours are 8:00 a.m. to 4:30 p.m. Monday - Friday. Please note that voicemails left after 4:00 p.m. may not be returned until the following business day.  We are closed weekends and major holidays. You have access to a nurse at all times for urgent questions. Please call the main number to the clinic [phone] and follow the prompts.  For any non-urgent questions, you may also contact your provider using MyChart. We now offer e-Visits for anyone 68 and older to request care online for non-urgent symptoms. For details visit [email or web address].   Also download the MyChart app! Go to the app store, search "MyChart", open the app, select [place], and log in with your MyChart username and password.

## 2024-01-15 NOTE — Progress Notes (Signed)
 Patient presents today for velcade , patient's labs are within treatment parameters. Patient tolerated Velcade  injection with no complaints voiced. Lab work reviewed. See MAR for details. Injection site clean and dry with no bruising or swelling noted. Patient stable during and after injection. Band aid applied. VSS. Patient left in satisfactory condition with no s/s of distress noted.

## 2024-01-16 LAB — PROTEIN ELECTROPHORESIS, SERUM
A/G Ratio: 1.3 (ref 0.7–1.7)
Albumin ELP: 3.5 g/dL (ref 2.9–4.4)
Alpha-1-Globulin: 0.2 g/dL (ref 0.0–0.4)
Alpha-2-Globulin: 1.1 g/dL — ABNORMAL HIGH (ref 0.4–1.0)
Beta Globulin: 0.9 g/dL (ref 0.7–1.3)
Gamma Globulin: 0.5 g/dL (ref 0.4–1.8)
Globulin, Total: 2.7 g/dL (ref 2.2–3.9)
M-Spike, %: 0.2 g/dL — ABNORMAL HIGH
Total Protein ELP: 6.2 g/dL (ref 6.0–8.5)

## 2024-01-16 LAB — KAPPA/LAMBDA LIGHT CHAINS
Kappa free light chain: 6 mg/L (ref 3.3–19.4)
Kappa, lambda light chain ratio: 0.15 — ABNORMAL LOW (ref 0.26–1.65)
Lambda free light chains: 39.1 mg/L — ABNORMAL HIGH (ref 5.7–26.3)

## 2024-01-20 ENCOUNTER — Other Ambulatory Visit: Payer: Self-pay | Admitting: *Deleted

## 2024-01-20 MED ORDER — GABAPENTIN 300 MG PO CAPS: 300.0000 mg | ORAL_CAPSULE | Freq: Three times a day (TID) | ORAL | 0 refills | Status: DC

## 2024-01-24 ENCOUNTER — Other Ambulatory Visit: Payer: Self-pay

## 2024-01-27 ENCOUNTER — Other Ambulatory Visit: Payer: Self-pay | Admitting: *Deleted

## 2024-01-27 MED ORDER — GLIPIZIDE 5 MG PO TABS: 5.0000 mg | ORAL_TABLET | Freq: Every day | ORAL | 0 refills | Status: DC

## 2024-01-28 ENCOUNTER — Other Ambulatory Visit: Payer: Self-pay | Admitting: Hematology

## 2024-01-28 NOTE — Progress Notes (Signed)
 [facility] [address] [place] [address]    Clinic Day:  01/29/2024  Referring physician: Twylla Galen, MD  Patient Care Team: Twylla Galen, MD as PCP - General (Internal Medicine) Paulett Boros, MD as Medical Oncologist (Medical Oncology) Vinetta Greening, DO as Consulting Physician (Gastroenterology)   ASSESSMENT & PLAN:   Assessment: 1.  IgG lambda plasma cell myeloma, stage II, standard risk: -4 cycles of KPD from 04/17/2018 through 08/21/2018, stem cell transplant on 10/23/2018. -PET scan on 01/21/2019 showed multiple bone lesions but without any hypermetabolic activity. -BMBX on 01/21/2019 with normocellular marrow with no increase in plasma cells.  Normal FISH.  MRD results negative. -Maintenance pomalidomide  2 mg 3 weeks on/1 week off started on 03/10/2019. -Bone marrow biopsy on 10/29/2019 shows trilineage hematopoiesis with no evidence of plasma cells.  Chromosome analysis and FISH are normal. -BM BX on 10/27/2020 at [facility]-normocellular marrow with 30 to 40%, TLH.  2% of the total cells are CD 138+ plasma cells. - Pomalyst  was held due to dizziness from 03/29/2021 through 04/26/2021. - MRI of the brain on 03/31/2021 did not show any evidence of intracranial lesions.  Multiple bone lesions in the skull compatible with sequela of myeloma.  - Labs at [facility] on 11/16/2021: M spike 0.24 g.  Immunofixation positive for IgG lambda. - PET scan on 12/14/2021: No evidence of FDG avid osseous or soft tissue myeloma. - She has developed left shoulder blade and right posterior rib pain for the last 3 to 4 months. - 24-hour urine on 12/14/2021: Total protein 136 mg.  Immune immunofixation positive for lambda type Bence-Jones protein. -- PET scan (05/24/2022): Single new hypermetabolic lytic lesion involving left lamina of T11.  No canal encroachment.  No other hypermetabolic bone lesions.  Stable diffuse lytic lesions throughout the axial and appendicular skeleton.  No  soft tissue lesions. - Maintenance Pomalyst  discontinued on 05/29/2022 - XRT to the T11 completed on 06/20/2022 - Daratumumab , Velcade  and dexamethasone  started on 06/11/2022   2.  Pulmonary embolism: -CT angiogram on 04/21/2020 showed filling defect at [facility]. -VQ scan confirmed pulmonary embolism. -She is on Eliquis .   3.  Right shoulder and upper arm pain: - She received XRT to the chest from 12/26/2020 through 01/06/2021.   4.  Myeloma bone disease: - Denosumab  held due to exposure of bone in the right lower jaw.  Last dose in 2020.    Plan: 1.  IgG lambda plasma cell myeloma: - She is tolerating Velcade  and monthly Darzalex  very well.  No infections reported. - Myeloma labs from 01/15/2024: M spike 0.2 g, up from undetectable previously.  Lambda light chains at 39, increased from 23.  FLC ratio is stable at 0.15, previously 0.17. - Recommend continuing Velcade  every 2 weeks and Darzalex  monthly.  Continue dexamethasone  20 mg on days of Darzalex . - Will repeat myeloma labs in 2 weeks.  RTC 4 weeks for follow-up. - She reported dizzy spells in the last 4 weeks.  She was started on Ozempic 5 to 6 weeks ago.  Will closely monitor.   2.  Back pain/left shoulder blade pain/right lateral and posterior rib pains: - Continue hydrocodone  10/325 every 6-8 hours as needed.   3.  Hypomagnesemia: - Continue magnesium  once daily.  Magnesium  is normal last time.   4.  Anxiety: - Continue Xanax  0.5 mg twice daily.   5.  Sleeping difficulty: - Continue Ambien  daily as needed as it is helping.  6.  Peripheral neuropathy: - Neuropathy is controlled well with gabapentin  300 mg 3 times daily.   7.  Pulmonary embolism: - Continue Eliquis  twice daily.  No bleeding issues.    Orders Placed This Encounter  Procedures   Kappa/lambda light chains    Standing Status:   Future    Expected Date:   02/12/2024    Expiration Date:   01/28/2025   Protein electrophoresis, serum     Standing Status:   Future    Expected Date:   02/12/2024    Expiration Date:   01/28/2025   IFE, Dara-Specific, Serum    Standing Status:   Future    Expected Date:   02/12/2024    Expiration Date:   01/28/2025   Comprehensive metabolic panel    Standing Status:   Future    Expected Date:   02/26/2024    Expiration Date:   02/25/2025   CBC with Differential    Standing Status:   Future    Expected Date:   02/26/2024    Expiration Date:   02/25/2025   CBC with Differential    Standing Status:   Future    Expected Date:   03/11/2024    Expiration Date:   03/11/2025   Comprehensive metabolic panel    Standing Status:   Future    Expected Date:   03/11/2024    Expiration Date:   03/11/2025   Comprehensive metabolic panel    Standing Status:   Future    Expected Date:   03/25/2024    Expiration Date:   03/25/2025   CBC with Differential    Standing Status:   Future    Expected Date:   03/25/2024    Expiration Date:   03/25/2025   CBC with Differential    Standing Status:   Future    Expected Date:   04/08/2024    Expiration Date:   04/08/2025   Comprehensive metabolic panel    Standing Status:   Future    Expected Date:   04/08/2024    Expiration Date:   04/08/2025      Alexandra Price,acting as a scribe for Paulett Boros, MD.,have documented all relevant documentation on the behalf of Paulett Boros, MD,as directed by  Paulett Boros, MD while in the presence of Paulett Boros, MD.  I, Paulett Boros MD, have reviewed the above documentation for accuracy and completeness, and I agree with the above.    Paulett Boros, MD   6/11/20252:39 PM  CHIEF COMPLAINT:   Diagnosis: multiple myeloma    Cancer Staging  No matching staging information was found for the patient.    Prior Therapy: 1. KPD x 4 cycles from 04/17/2018 to 08/21/2018. 2. Stem cell transplant on 10/23/2018. 3. Radiation to left and right humerus 25 Gy in 10 fractions from 08/23/2020 to  08/25/2020. 4.  Maintenance Pomalyst  until 06/08/2022  Current Therapy:  Darzalex , Velcade  and dexamethasone  started on 06/11/2022    HISTORY OF PRESENT ILLNESS:   Oncology History  Multiple myeloma not having achieved remission (HCC)  04/07/2018 Initial Diagnosis   Multiple myeloma not having achieved remission (HCC)   06/11/2022 -  Chemotherapy   Patient is on Treatment Plan : MYELOMA RELAPSED / REFRACTORY Daratumumab  SQ + Bortezomib  + Dexamethasone  (DaraVd) q21d / Daratumumab  SQ q28d      Multiple myeloma without remission (HCC)  04/15/2018 Initial Diagnosis   Multiple myeloma without remission (HCC)   04/17/2018 - 09/05/2018 Chemotherapy   The patient  had dexamethasone  (DECADRON ) 4 MG tablet, 1 of 1 cycle, Start date: 04/15/2018, End date: 05/26/2018 palonosetron  (ALOXI ) injection 0.25 mg, 0.25 mg, Intravenous,  Once, 1 of 1 cycle Administration: 0.25 mg (04/17/2018), 0.25 mg (04/24/2018), 0.25 mg (05/01/2018) cyclophosphamide  (CYTOXAN ) 540 mg in sodium chloride  0.9 % 250 mL chemo infusion, 300 mg/m2 = 540 mg, Intravenous,  Once, 1 of 1 cycle Administration: 540 mg (04/17/2018), 540 mg (04/24/2018), 540 mg (05/01/2018) carfilzomib  (KYPROLIS ) 36 mg in dextrose  5 % 50 mL chemo infusion, 20 mg/m2 = 36 mg, Intravenous, Once, 5 of 5 cycles Administration: 36 mg (04/17/2018), 36 mg (04/18/2018), 60 mg (04/24/2018), 60 mg (04/25/2018), 60 mg (05/01/2018), 60 mg (05/02/2018), 60 mg (05/15/2018), 60 mg (05/16/2018), 60 mg (06/12/2018), 60 mg (06/13/2018), 60 mg (06/26/2018), 60 mg (06/27/2018), 60 mg (07/03/2018), 60 mg (07/04/2018), 60 mg (07/24/2018), 60 mg (07/25/2018), 60 mg (07/31/2018), 60 mg (08/01/2018), 60 mg (08/08/2018), 60 mg (08/07/2018), 60 mg (08/21/2018), 60 mg (08/22/2018), 60 mg (08/28/2018), 60 mg (08/29/2018), 60 mg (09/04/2018), 60 mg (09/05/2018)  for chemotherapy treatment.    06/11/2022 -  Chemotherapy   Patient is on Treatment Plan : MYELOMA RELAPSED / REFRACTORY Daratumumab  SQ + Bortezomib  +  Dexamethasone  (DaraVd) q21d / Daratumumab  SQ q28d         INTERVAL HISTORY:   Alexandra Price is a 78 y.o. female presenting to clinic today for follow up of multiple myeloma. She was last seen by me on 11/06/23.  Since her last visit, she had follow-up with Dr. Gaila Josephs on 11/27/23 at [facility].   Today, she states that she is doing well overall. Her appetite level is at 100%. Her energy level is at 75%.  PAST MEDICAL HISTORY:   Past Medical History: Past Medical History:  Diagnosis Date   Anxiety    Chronic kidney disease    Depression    Hypertension    Multiple myeloma (HCC)    multiple myeloma   Pre-diabetes     Surgical History: Past Surgical History:  Procedure Laterality Date   ABDOMINAL HYSTERECTOMY     total   APPENDECTOMY     BALLOON DILATION N/A 05/28/2022   Procedure: BALLOON DILATION;  Surgeon: Vinetta Greening, DO;  [facility];  Service: Endoscopy;  Laterality: N/A;   BIOPSY  05/28/2022   Procedure: BIOPSY;  Surgeon: Vinetta Greening, DO;  [facility];  Service: Endoscopy;;   COLONOSCOPY WITH PROPOFOL  N/A 12/25/2021   Procedure: COLONOSCOPY WITH PROPOFOL ;  Surgeon: Vinetta Greening, DO;  [facility]: [facility];  Service: Endoscopy;  Laterality: N/A;  2:30pm   ESOPHAGOGASTRODUODENOSCOPY (EGD) WITH PROPOFOL  N/A 05/28/2022   Procedure: ESOPHAGOGASTRODUODENOSCOPY (EGD) WITH PROPOFOL ;  Surgeon: Vinetta Greening, DO;  Location: [facility];  Service: Endoscopy;  Laterality: N/A;  10:00am, asa 3   LAPAROSCOPIC APPENDECTOMY N/A 05/20/2018   Procedure: APPENDECTOMY LAPAROSCOPIC;  Surgeon: Alanda Allegra, MD;  Location: [facility];  Service: General;  Laterality: N/A;   POLYPECTOMY  12/25/2021   Procedure: POLYPECTOMY;  Surgeon: Vinetta Greening, DO;  [facility]: [facility];  Service: Endoscopy;;   PORTACATH PLACEMENT Right 04/14/2018   Procedure: INSERTION PORT-A-CATH;  Surgeon: Alanda Allegra, MD;  Location: [facility];  Service: General;  Laterality: Right;     Social History: Social History   Socioeconomic History   Marital status: Divorced    Spouse name: Not on file   Number of children: 6   Years of education: Not on file   Highest education level: Not  on file  Occupational History    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  Tobacco Use   Smoking status: Never   Smokeless tobacco: Never  Vaping Use   Vaping status: Never Used  Substance and Sexual Activity   Alcohol use: Never   Drug use: Not Currently   Sexual activity: Not Currently  Other Topics Concern   Not on file  Social History Narrative   Not on file   Social Drivers of Health   Financial Resource Strain: Low Risk  (07/06/2020)   Overall Financial Resource Strain (CARDIA)    Difficulty of Paying Living Expenses: Not hard at all  Food Insecurity: No Food Insecurity (09/17/2022)   Hunger Vital Sign    Worried About Running Out of Food in the Last Year: Never true    Ran Out of Food in the Last Year: Never true  Transportation Needs: No Transportation Needs (09/17/2022)   PRAPARE - Administrator, Civil Service (Medical): No    Lack of Transportation (Non-Medical): No  Physical Activity: Inactive (07/06/2020)   Exercise Vital Sign    Days of Exercise per Week: 0 days    Minutes of Exercise per Session: 0 min  Stress: No Stress Concern Present (07/06/2020)   [employer] of Occupational Health - Occupational Stress Questionnaire    Feeling of Stress : Not at all  Social Connections: Moderately Isolated (07/06/2020)   Social Connection and Isolation Panel [NHANES]    Frequency of Communication with Friends and Family: More than three times a week    Frequency of Social Gatherings with Friends and Family: Twice a week    Attends Religious Services: 1 to 4 times per year    Active Member of [employer] or Organizations: No    Attends Banker Meetings: Never    Marital Status: Separated  Intimate Partner Violence: Not At Risk (09/17/2022)    Humiliation, Afraid, Rape, and Kick questionnaire    Fear of Current or Ex-Partner: No    Emotionally Abused: No    Physically Abused: No    Sexually Abused: No    Family History: Family History  Problem Relation Age of Onset   Heart disease Mother    Emphysema Father    Diabetes Sister    Depression Sister    Cancer Brother        liver, lung, and colon. colon cancer at age 39.   Diabetes Brother     Current Medications:  Current Outpatient Medications:    acetaminophen  (TYLENOL ) 325 MG tablet, Take 650 mg by mouth as needed for moderate pain (prior to infusion)., Disp: , Rfl:    acyclovir  (ZOVIRAX ) 400 MG tablet, Take 1 tablet (400 mg total) by mouth 2 (two) times daily., Disp: 60 tablet, Rfl: 6   ALPRAZolam  (XANAX ) 0.25 MG tablet, Take one tablet (0.25 mg) by mouth in the morning as needed, and two tablets (0.5 mg) at bedtime as needed, Disp: 90 tablet, Rfl: 2   Calcium  Carb-Cholecalciferol  (CALCIUM  1000 + D PO), Take 1,000 mg by mouth daily., Disp: , Rfl:    dexAMETHasone  20 MG TABS, Take 1 tablet by mouth as directed. Take 1 tablet 30 minutes prior to infusions (Patient taking differently: Take 1 tablet by mouth as directed. Take 1 tablet 30 minutes prior to the Darzelex injections-once a month), Disp: 30 tablet, Rfl: 2   diclofenac  Sodium (VOLTAREN ) 1 % GEL, 4 gram every 8 hours as needed., Disp: 100 g, Rfl: 11   diphenhydrAMINE  (BENADRYL ) 25  MG tablet, Take 50 mg by mouth as needed (prior to infusion)., Disp: , Rfl:    ELIQUIS  2.5 MG TABS tablet, Take 2.5 mg by mouth 2 (two) times daily., Disp: , Rfl:    escitalopram  (LEXAPRO ) 20 MG tablet, Take 1 tablet by mouth once daily, Disp: 30 tablet, Rfl: 0   furosemide  (LASIX ) 20 MG tablet, Take 1 tablet (20 mg total) by mouth as needed (in am as needed for swelling)., Disp: 30 tablet, Rfl: 2   gabapentin  (NEURONTIN ) 300 MG capsule, Take 1 capsule (300 mg total) by mouth 3 (three) times daily., Disp: 90 capsule, Rfl: 0   glipiZIDE   (GLUCOTROL ) 5 MG tablet, Take 1 tablet (5 mg total) by mouth daily before breakfast., Disp: 90 tablet, Rfl: 0   HYDROcodone -acetaminophen  (NORCO) 10-325 MG tablet, Take 1 tablet by mouth every 6 (six) hours as needed., Disp: 120 tablet, Rfl: 0   lidocaine -prilocaine  (EMLA ) cream, Apply 1 Application topically as needed. 1 squeeze every 8 hours, Disp: 30 g, Rfl: 11   magnesium  oxide (MAG-OX) 400 (240 Mg) MG tablet, Take 1 tablet by mouth 2 (two) times daily., Disp: , Rfl:    midodrine  (PROAMATINE ) 2.5 MG tablet, Take 1 tablet (2.5 mg total) by mouth 3 (three) times daily with meals., Disp: 90 tablet, Rfl: 1   Multiple Vitamin (MULTI VITAMIN) TABS, Take 1 tablet by mouth daily., Disp: , Rfl:    pantoprazole  (PROTONIX ) 40 MG tablet, Take 1 tablet by mouth once daily, Disp: 90 tablet, Rfl: 1   polyethylene glycol (MIRALAX  / GLYCOLAX ) 17 g packet, Take 17 g by mouth daily as needed for moderate constipation., Disp: 14 each, Rfl: 0   prochlorperazine  (COMPAZINE ) 10 MG tablet, Take 1 tablet (10 mg total) by mouth every 6 (six) hours as needed for nausea or vomiting., Disp: 30 tablet, Rfl: 0   rosuvastatin  (CRESTOR ) 10 MG tablet, Take 10 mg by mouth daily., Disp: , Rfl:    Semaglutide (OZEMPIC, 0.25 OR 0.5 MG/DOSE, [place]), Inject 0.25 mg into the skin once a week., Disp: , Rfl:    zolpidem  (AMBIEN ) 10 MG tablet, Take 1 tablet (10 mg total) by mouth at bedtime as needed., Disp: 30 tablet, Rfl: 5   Allergies: Allergies  Allergen Reactions   Ciprofloxacin  Anaphylaxis   Amoxicillin  Other (See Comments)    unknown   Morphine And Codeine Nausea And Vomiting   Augmentin [Amoxicillin -Pot Clavulanate] Other (See Comments)    Headache, insomnia    REVIEW OF SYSTEMS:   Review of Systems  Constitutional:  Negative for chills, fatigue and fever.  HENT:   Negative for lump/mass, mouth sores, nosebleeds, sore throat and trouble swallowing.   Eyes:  Negative for eye problems.  Respiratory:  Positive for shortness  of breath. Negative for cough.   Cardiovascular:  Positive for chest pain. Negative for leg swelling and palpitations.  Gastrointestinal:  Negative for abdominal pain, constipation, diarrhea, nausea and vomiting.  Genitourinary:  Negative for bladder incontinence, difficulty urinating, dysuria, frequency, hematuria and nocturia.   Musculoskeletal:  Negative for arthralgias, back pain, flank pain, myalgias and neck pain.  Skin:  Negative for itching and rash.  Neurological:  Positive for dizziness and headaches. Negative for numbness.  Hematological:  Does not bruise/bleed easily.  Psychiatric/Behavioral:  Positive for depression and sleep disturbance. Negative for suicidal ideas. The patient is nervous/anxious.   All other systems reviewed and are negative.    VITALS:   Blood pressure (!) 106/54, pulse 66, temperature 98.4 F (36.9 C),  temperature source Tympanic, resp. rate 20, weight 187 lb 6.3 oz (85 kg), SpO2 99%.  Wt Readings from Last 3 Encounters:  01/29/24 187 lb 6.3 oz (85 kg)  01/15/24 185 lb 1.6 oz (84 kg)  01/01/24 189 lb 14.4 oz (86.1 kg)    Body mass index is 30.25 kg/m.  Performance status (ECOG): 1 - Symptomatic but completely ambulatory  PHYSICAL EXAM:   Physical Exam Vitals and nursing note reviewed. Exam conducted with a chaperone present.  Constitutional:      Appearance: Normal appearance.  Cardiovascular:     Rate and Rhythm: Normal rate and regular rhythm.     Pulses: Normal pulses.     Heart sounds: Normal heart sounds.  Pulmonary:     Effort: Pulmonary effort is normal.     Breath sounds: Normal breath sounds.  Abdominal:     Palpations: Abdomen is soft. There is no hepatomegaly, splenomegaly or mass.     Tenderness: There is no abdominal tenderness.  Musculoskeletal:     Right lower leg: No edema.     Left lower leg: No edema.  Lymphadenopathy:     Cervical: No cervical adenopathy.     Right cervical: No superficial, deep or posterior cervical  adenopathy.    Left cervical: No superficial, deep or posterior cervical adenopathy.     Upper Body:     Right upper body: No supraclavicular or axillary adenopathy.     Left upper body: No supraclavicular or axillary adenopathy.  Neurological:     General: No focal deficit present.     Mental Status: She is alert and oriented to person, place, and time.  Psychiatric:        Mood and Affect: Mood normal.        Behavior: Behavior normal.     LABS:   CBC     Component Value Date/Time   WBC 3.2 (L) 01/29/2024 1013   RBC 3.06 (L) 01/29/2024 1013   HGB 9.7 (L) 01/29/2024 1013   HGB 9.4 (L) 12/15/2018 1244   HCT 31.1 (L) 01/29/2024 1013   PLT 141 (L) 01/29/2024 1013   MCV 101.6 (H) 01/29/2024 1013   MCH 31.7 01/29/2024 1013   MCHC 31.2 01/29/2024 1013   RDW 13.3 01/29/2024 1013   LYMPHSABS 0.7 01/29/2024 1013   MONOABS 0.3 01/29/2024 1013   EOSABS 0.3 01/29/2024 1013   BASOSABS 0.0 01/29/2024 1013    CMP      Component Value Date/Time   NA 140 01/29/2024 1013   K 4.7 01/29/2024 1013   CL 104 01/29/2024 1013   CO2 27 01/29/2024 1013   GLUCOSE 119 (H) 01/29/2024 1013   BUN 24 (H) 01/29/2024 1013   CREATININE 1.67 (H) 01/29/2024 1013   CALCIUM  8.9 01/29/2024 1013   PROT 6.3 (L) 01/29/2024 1013   ALBUMIN  3.7 01/29/2024 1013   AST 26 01/29/2024 1013   ALT 30 01/29/2024 1013   ALKPHOS 83 01/29/2024 1013   BILITOT 0.5 01/29/2024 1013   GFRNONAA 32 (L) 01/29/2024 1013   GFRAA 30 (L) 05/02/2020 0939     No results found for: CEA1, CEA / No results found for: CEA1, CEA No results found for: PSA1 No results found for: WUX324 No results found for: MWN027  Lab Results  Component Value Date   TOTALPROTELP 6.2 01/15/2024   ALBUMINELP 3.5 01/15/2024   A1GS 0.2 01/15/2024   A2GS 1.1 (H) 01/15/2024   BETS 0.9 01/15/2024   GAMS 0.5 01/15/2024   MSPIKE  0.2 (H) 01/15/2024   SPEI Comment 01/15/2024   Lab Results  Component Value Date   TIBC 290  09/17/2022   TIBC 278 07/30/2022   TIBC 272 05/24/2022   FERRITIN 1,059 (H) 09/17/2022   FERRITIN 313 (H) 07/30/2022   FERRITIN 435 (H) 05/24/2022   IRONPCTSAT 10 (L) 09/17/2022   IRONPCTSAT 21 07/30/2022   IRONPCTSAT 27 05/24/2022   Lab Results  Component Value Date   LDH 129 05/24/2022   LDH 135 03/20/2022   LDH 129 12/14/2021     STUDIES:   No results found.

## 2024-01-29 ENCOUNTER — Encounter: Payer: Self-pay | Admitting: Hematology

## 2024-01-29 ENCOUNTER — Inpatient Hospital Stay

## 2024-01-29 ENCOUNTER — Inpatient Hospital Stay: Attending: Hematology | Admitting: Hematology

## 2024-01-29 VITALS — BP 106/54 | HR 66 | Temp 98.4°F | Resp 20 | Wt 187.4 lb

## 2024-01-29 DIAGNOSIS — C9000 Multiple myeloma not having achieved remission: Secondary | ICD-10-CM | POA: Diagnosis not present

## 2024-01-29 DIAGNOSIS — Z7901 Long term (current) use of anticoagulants: Secondary | ICD-10-CM | POA: Diagnosis not present

## 2024-01-29 DIAGNOSIS — G629 Polyneuropathy, unspecified: Secondary | ICD-10-CM | POA: Insufficient documentation

## 2024-01-29 DIAGNOSIS — F419 Anxiety disorder, unspecified: Secondary | ICD-10-CM | POA: Insufficient documentation

## 2024-01-29 DIAGNOSIS — I2699 Other pulmonary embolism without acute cor pulmonale: Secondary | ICD-10-CM | POA: Insufficient documentation

## 2024-01-29 DIAGNOSIS — Z5112 Encounter for antineoplastic immunotherapy: Secondary | ICD-10-CM | POA: Diagnosis present

## 2024-01-29 DIAGNOSIS — C9002 Multiple myeloma in relapse: Secondary | ICD-10-CM | POA: Diagnosis present

## 2024-01-29 LAB — COMPREHENSIVE METABOLIC PANEL WITH GFR
ALT: 30 U/L (ref 0–44)
AST: 26 U/L (ref 15–41)
Albumin: 3.7 g/dL (ref 3.5–5.0)
Alkaline Phosphatase: 83 U/L (ref 38–126)
Anion gap: 9 (ref 5–15)
BUN: 24 mg/dL — ABNORMAL HIGH (ref 8–23)
CO2: 27 mmol/L (ref 22–32)
Calcium: 8.9 mg/dL (ref 8.9–10.3)
Chloride: 104 mmol/L (ref 98–111)
Creatinine, Ser: 1.67 mg/dL — ABNORMAL HIGH (ref 0.44–1.00)
GFR, Estimated: 32 mL/min — ABNORMAL LOW (ref >60)
Glucose, Bld: 119 mg/dL — ABNORMAL HIGH (ref 70–99)
Potassium: 4.7 mmol/L (ref 3.5–5.1)
Sodium: 140 mmol/L (ref 135–145)
Total Bilirubin: 0.5 mg/dL (ref 0.0–1.2)
Total Protein: 6.3 g/dL — ABNORMAL LOW (ref 6.5–8.1)

## 2024-01-29 LAB — CBC WITH DIFFERENTIAL/PLATELET
Abs Immature Granulocytes: 0.01 10*3/uL (ref 0.00–0.07)
Basophils Absolute: 0 10*3/uL (ref 0.0–0.1)
Basophils Relative: 1 %
Eosinophils Absolute: 0.3 10*3/uL (ref 0.0–0.5)
Eosinophils Relative: 11 %
HCT: 31.1 % — ABNORMAL LOW (ref 36.0–46.0)
Hemoglobin: 9.7 g/dL — ABNORMAL LOW (ref 12.0–15.0)
Immature Granulocytes: 0 %
Lymphocytes Relative: 20 %
Lymphs Abs: 0.7 10*3/uL (ref 0.7–4.0)
MCH: 31.7 pg (ref 26.0–34.0)
MCHC: 31.2 g/dL (ref 30.0–36.0)
MCV: 101.6 fL — ABNORMAL HIGH (ref 80.0–100.0)
Monocytes Absolute: 0.3 10*3/uL (ref 0.1–1.0)
Monocytes Relative: 10 %
Neutro Abs: 1.8 10*3/uL (ref 1.7–7.7)
Neutrophils Relative %: 58 %
Platelets: 141 10*3/uL — ABNORMAL LOW (ref 150–400)
RBC: 3.06 MIL/uL — ABNORMAL LOW (ref 3.87–5.11)
RDW: 13.3 % (ref 11.5–15.5)
WBC: 3.2 10*3/uL — ABNORMAL LOW (ref 4.0–10.5)
nRBC: 0 % (ref 0.0–0.2)

## 2024-01-29 MED ORDER — SODIUM CHLORIDE 0.9% FLUSH
10.0000 mL | Freq: Once | INTRAVENOUS | Status: AC
  Administered 2024-01-29: 10 mL via INTRAVENOUS

## 2024-01-29 MED ORDER — HEPARIN SOD (PORK) LOCK FLUSH 100 UNIT/ML IV SOLN: 500.0000 [IU] | Freq: Once | INTRAVENOUS | Status: DC

## 2024-01-29 MED ORDER — BORTEZOMIB CHEMO SQ INJECTION 3.5 MG (2.5MG/ML)
1.0000 mg/m2 | Freq: Once | INTRAMUSCULAR | Status: AC
  Administered 2024-01-29: 2 mg via SUBCUTANEOUS
  Filled 2024-01-29: qty 0.8

## 2024-01-29 MED ORDER — HEPARIN SOD (PORK) LOCK FLUSH 100 UNIT/ML IV SOLN
500.0000 [IU] | Freq: Once | INTRAVENOUS | Status: AC
  Administered 2024-01-29: 500 [IU] via INTRAVENOUS

## 2024-01-29 MED ORDER — DARATUMUMAB-HYALURONIDASE-FIHJ 1800-30000 MG-UT/15ML ~~LOC~~ SOLN
1800.0000 mg | Freq: Once | SUBCUTANEOUS | Status: AC
  Administered 2024-01-29: 1800 mg via SUBCUTANEOUS
  Filled 2024-01-29: qty 15

## 2024-01-29 MED ORDER — SODIUM CHLORIDE 0.9% FLUSH: 10.0000 mL | Freq: Once | INTRAVENOUS | Status: DC

## 2024-01-29 NOTE — Progress Notes (Signed)
 Patient took own premeds.  Patient tolerated Daratumumab  injection with no complaints voiced.  See MAR for details.  Labs reviewed. Injection site clean and dry with no bruising or swelling noted at site.  Band aid applied.  Vss with discharge and left in satisfactory condition with no s/s of distress noted.

## 2024-01-29 NOTE — Patient Instructions (Signed)

## 2024-01-29 NOTE — Patient Instructions (Signed)
 [facility] - A DEPT OF [facility]  Discharge Instructions: Thank you for choosing [facility] to provide your oncology and hematology care.  If you have a lab appointment with the Cancer Center - please note that after April 8th, 2024, all labs will be drawn in the cancer center.  You do not have to check in or register with the main entrance as you have in the past but will complete your check-in in the cancer center.  Wear comfortable clothing and clothing appropriate for easy access to any Portacath or PICC line.   We strive to give you quality time with your provider. You may need to reschedule your appointment if you arrive late (15 or more minutes).  Arriving late affects you and other patients whose appointments are after yours.  Also, if you miss three or more appointments without notifying the office, you may be dismissed from the clinic at the provider's discretion.      For prescription refill requests, have your pharmacy contact our office and allow 72 hours for refills to be completed.    Today you received the following chemotherapy and/or immunotherapy agents darzalex  and velcade .       To help prevent nausea and vomiting after your treatment, we encourage you to take your nausea medication as directed.  BELOW ARE SYMPTOMS THAT SHOULD BE REPORTED IMMEDIATELY: *FEVER GREATER THAN 100.4 F (38 C) OR HIGHER *CHILLS OR SWEATING *NAUSEA AND VOMITING THAT IS NOT CONTROLLED WITH YOUR NAUSEA MEDICATION *UNUSUAL SHORTNESS OF BREATH *UNUSUAL BRUISING OR BLEEDING *URINARY PROBLEMS (pain or burning when urinating, or frequent urination) *BOWEL PROBLEMS (unusual diarrhea, constipation, pain near the anus) TENDERNESS IN MOUTH AND THROAT WITH OR WITHOUT PRESENCE OF ULCERS (sore throat, sores in mouth, or a toothache) UNUSUAL RASH, SWELLING OR PAIN  UNUSUAL VAGINAL DISCHARGE OR ITCHING   Items with * indicate a potential emergency and should  be followed up as soon as possible or go to the Emergency Department if any problems should occur.  Please show the CHEMOTHERAPY ALERT CARD or IMMUNOTHERAPY ALERT CARD at check-in to the Emergency Department and triage nurse.  Should you have questions after your visit or need to cancel or reschedule your appointment, please contact [facility] [place] - A DEPT OF [facility] [phone]  and follow the prompts.  Office hours are 8:00 a.m. to 4:30 p.m. Monday - Friday. Please note that voicemails left after 4:00 p.m. may not be returned until the following business day.  We are closed weekends and major holidays. You have access to a nurse at all times for urgent questions. Please call the main number to the clinic [phone] and follow the prompts.  For any non-urgent questions, you may also contact your provider using MyChart. We now offer e-Visits for anyone 45 and older to request care online for non-urgent symptoms. For details visit [email or web address].   Also download the MyChart app! Go to the app store, search "MyChart", open the app, select [facility], and log in with your MyChart username and password.

## 2024-01-31 ENCOUNTER — Other Ambulatory Visit: Payer: Self-pay

## 2024-02-01 ENCOUNTER — Other Ambulatory Visit: Payer: Self-pay

## 2024-02-03 ENCOUNTER — Telehealth: Payer: Self-pay | Admitting: *Deleted

## 2024-02-03 NOTE — Telephone Encounter (Signed)
 Patient called to advise that she is having a difficult time with her son, who lives in her home.  She states that he is in Loss adjuster, chartered and is angry all of the time with periods of lashing out.  She states that she does not feel safe and that he needs help.  She requested an increase in her xanax  to tolerate him.  I advised that this was not a viable option and recommended that she call 911 if she felt unsafe in her home so that he can get the help he needs.  She declined to do so at this time.

## 2024-02-05 ENCOUNTER — Other Ambulatory Visit: Payer: Self-pay | Admitting: *Deleted

## 2024-02-05 DIAGNOSIS — C9000 Multiple myeloma not having achieved remission: Secondary | ICD-10-CM

## 2024-02-05 MED ORDER — HYDROCODONE-ACETAMINOPHEN 10-325 MG PO TABS: 1.0000 | ORAL_TABLET | Freq: Four times a day (QID) | ORAL | 0 refills | Status: DC | PRN

## 2024-02-11 ENCOUNTER — Other Ambulatory Visit: Payer: Self-pay

## 2024-02-12 ENCOUNTER — Inpatient Hospital Stay

## 2024-02-12 ENCOUNTER — Inpatient Hospital Stay: Admitting: Hematology

## 2024-02-12 DIAGNOSIS — Z5112 Encounter for antineoplastic immunotherapy: Secondary | ICD-10-CM | POA: Diagnosis not present

## 2024-02-12 DIAGNOSIS — C9000 Multiple myeloma not having achieved remission: Secondary | ICD-10-CM

## 2024-02-12 LAB — CBC WITH DIFFERENTIAL/PLATELET
Abs Immature Granulocytes: 0.01 10*3/uL (ref 0.00–0.07)
Basophils Absolute: 0 10*3/uL (ref 0.0–0.1)
Basophils Relative: 0 %
Eosinophils Absolute: 0.2 10*3/uL (ref 0.0–0.5)
Eosinophils Relative: 3 %
HCT: 30.9 % — ABNORMAL LOW (ref 36.0–46.0)
Hemoglobin: 9.7 g/dL — ABNORMAL LOW (ref 12.0–15.0)
Immature Granulocytes: 0 %
Lymphocytes Relative: 12 %
Lymphs Abs: 0.6 10*3/uL — ABNORMAL LOW (ref 0.7–4.0)
MCH: 31.4 pg (ref 26.0–34.0)
MCHC: 31.4 g/dL (ref 30.0–36.0)
MCV: 100 fL (ref 80.0–100.0)
Monocytes Absolute: 0.4 10*3/uL (ref 0.1–1.0)
Monocytes Relative: 7 %
Neutro Abs: 3.8 10*3/uL (ref 1.7–7.7)
Neutrophils Relative %: 78 %
Platelets: 140 10*3/uL — ABNORMAL LOW (ref 150–400)
RBC: 3.09 MIL/uL — ABNORMAL LOW (ref 3.87–5.11)
RDW: 13.4 % (ref 11.5–15.5)
WBC: 4.9 10*3/uL (ref 4.0–10.5)
nRBC: 0 % (ref 0.0–0.2)

## 2024-02-12 LAB — COMPREHENSIVE METABOLIC PANEL WITH GFR
ALT: 32 U/L (ref 0–44)
AST: 33 U/L (ref 15–41)
Albumin: 3.7 g/dL (ref 3.5–5.0)
Alkaline Phosphatase: 83 U/L (ref 38–126)
Anion gap: 12 (ref 5–15)
BUN: 26 mg/dL — ABNORMAL HIGH (ref 8–23)
CO2: 28 mmol/L (ref 22–32)
Calcium: 9.3 mg/dL (ref 8.9–10.3)
Chloride: 101 mmol/L (ref 98–111)
Creatinine, Ser: 1.69 mg/dL — ABNORMAL HIGH (ref 0.44–1.00)
GFR, Estimated: 31 mL/min — ABNORMAL LOW (ref >60)
Glucose, Bld: 171 mg/dL — ABNORMAL HIGH (ref 70–99)
Potassium: 4.3 mmol/L (ref 3.5–5.1)
Sodium: 141 mmol/L (ref 135–145)
Total Bilirubin: 0.5 mg/dL (ref 0.0–1.2)
Total Protein: 6.4 g/dL — ABNORMAL LOW (ref 6.5–8.1)

## 2024-02-12 MED ORDER — HEPARIN SOD (PORK) LOCK FLUSH 100 UNIT/ML IV SOLN
500.0000 [IU] | Freq: Once | INTRAVENOUS | Status: AC
  Administered 2024-02-12: 500 [IU] via INTRAVENOUS

## 2024-02-12 MED ORDER — BORTEZOMIB CHEMO SQ INJECTION 3.5 MG (2.5MG/ML)
1.0000 mg/m2 | Freq: Once | INTRAMUSCULAR | Status: AC
  Administered 2024-02-12: 2 mg via SUBCUTANEOUS
  Filled 2024-02-12: qty 0.8

## 2024-02-12 MED ORDER — SODIUM CHLORIDE 0.9% FLUSH
10.0000 mL | Freq: Once | INTRAVENOUS | Status: AC
  Administered 2024-02-12: 10 mL via INTRAVENOUS

## 2024-02-12 NOTE — Progress Notes (Signed)
 Patient presents today for Velcade injection.  Patient is in satisfactory condition with no new complaints voiced.  Vital signs are stable.  Labs reviewed and all labs are within treatment parameters.  We will proceed with treatment per MD orders.    Patient tolerated injection with no complaints voiced.  Site clean and dry with no bruising or swelling noted.  No complaints of pain.  Discharged with vital signs stable and no signs or symptoms of distress noted.

## 2024-02-12 NOTE — Patient Instructions (Signed)
 [facility] [place] - A DEPT OF [facility]  Discharge Instructions: Thank you for choosing [facility] to provide your oncology and hematology care.  If you have a lab appointment with the Cancer Center - please note that after April 8th, 2024, all labs will be drawn in the cancer center.  You do not have to check in or register with the main entrance as you have in the past but will complete your check-in in the cancer center.  Wear comfortable clothing and clothing appropriate for easy access to any Portacath or PICC line.   We strive to give you quality time with your provider. You may need to reschedule your appointment if you arrive late (15 or more minutes).  Arriving late affects you and other patients whose appointments are after yours.  Also, if you miss three or more appointments without notifying the office, you may be dismissed from the clinic at the provider's discretion.      For prescription refill requests, have your pharmacy contact our office and allow 72 hours for refills to be completed.    Today you received the following chemotherapy and/or immunotherapy agents Velcade.  Bortezomib Injection What is this medication? BORTEZOMIB (bor TEZ oh mib) treats lymphoma. It may also be used to treat multiple myeloma, a type of bone marrow cancer. It works by blocking a protein that causes cancer cells to grow and multiply. This helps to slow or stop the spread of cancer cells. This medicine may be used for other purposes; ask your health care provider or pharmacist if you have questions. COMMON BRAND NAME(S): BORUZU, Velcade What should I tell my care team before I take this medication? They need to know if you have any of these conditions: Dehydration Diabetes Heart disease Liver disease Tingling of the fingers or toes or other nerve disorder An unusual or allergic reaction to bortezomib, other medications, foods, dyes, or preservatives If  you or your partner are pregnant or trying to get pregnant Breastfeeding How should I use this medication? This medication is injected into a vein or under the skin. It is given by your care team in a hospital or clinic setting. Talk to your care team about the use of this medication in children. Special care may be needed. Overdosage: If you think you have taken too much of this medicine contact a poison control center or emergency room at once. NOTE: This medicine is only for you. Do not share this medicine with others. What if I miss a dose? Keep appointments for follow-up doses. It is important not to miss your dose. Call your care team if you are unable to keep an appointment. What may interact with this medication? Ketoconazole Rifampin This list may not describe all possible interactions. Give your health care provider a list of all the medicines, herbs, non-prescription drugs, or dietary supplements you use. Also tell them if you smoke, drink alcohol, or use illegal drugs. Some items may interact with your medicine. What should I watch for while using this medication? Your condition will be monitored carefully while you are receiving this medication. You may need blood work while taking this medication. This medication may affect your coordination, reaction time, or judgment. Do not drive or operate machinery until you know how this medication affects you. Sit up or stand slowly to reduce the risk of dizzy or fainting spells. Drinking alcohol with this medication can increase the risk of these side effects.  This medication may increase your risk of getting an infection. Call your care team for advice if you get a fever, chills, sore throat, or other symptoms of a cold or flu. Do not treat yourself. Try to avoid being around people who are sick. Check with your care team if you have severe diarrhea, nausea, and vomiting, or if you sweat a lot. The loss of too much body fluid may make it  dangerous for you to take this medication. Talk to your care team if you may be pregnant. Serious birth defects can occur if you take this medication during pregnancy and for 7 months after the last dose. You will need a negative pregnancy test before starting this medication. Contraception is recommended while taking this medication and for 7 months after the last dose. Your care team can help you find the option that works for you. If your partner can get pregnant, use a condom during sex while taking this medication and for 4 months after the last dose. Do not breastfeed while taking this medication and for 2 months after the last dose. This medication may cause infertility. Talk to your care team if you are concerned about your fertility. What side effects may I notice from receiving this medication? Side effects that you should report to your care team as soon as possible: Allergic reactions--skin rash, itching, hives, swelling of the face, lips, tongue, or throat Bleeding--bloody or black, tar-like stools, vomiting blood or [place] material that looks like coffee grounds, red or dark [place] urine, small red or purple spots on skin, unusual bruising or bleeding Bleeding in the brain--severe headache, stiff neck, confusion, dizziness, change in vision, numbness or weakness of the face, arm, or leg, trouble speaking, trouble walking, vomiting Bowel blockage--stomach cramping, unable to have a bowel movement or pass gas, loss of appetite, vomiting Heart failure--shortness of breath, swelling of the ankles, feet, or hands, sudden weight gain, unusual weakness or fatigue Infection--fever, chills, cough, sore throat, wounds that don't heal, pain or trouble when passing urine, general feeling of discomfort or being unwell Liver injury--right upper belly pain, loss of appetite, nausea, light-colored stool, dark yellow or [place] urine, yellowing skin or eyes, unusual weakness or fatigue Low blood  pressure--dizziness, feeling faint or lightheaded, blurry vision Lung injury--shortness of breath or trouble breathing, cough, spitting up blood, chest pain, fever Pain, tingling, or numbness in the hands or feet Severe or prolonged diarrhea Stomach pain, bloody diarrhea, pale skin, unusual weakness or fatigue, decrease in the amount of urine, which may be signs of hemolytic uremic syndrome Sudden and severe headache, confusion, change in vision, seizures, which may be signs of posterior reversible encephalopathy syndrome (PRES) TTP--purple spots on the skin or inside the mouth, pale skin, yellowing skin or eyes, unusual weakness or fatigue, fever, fast or irregular heartbeat, confusion, change in vision, trouble speaking, trouble walking Tumor lysis syndrome (TLS)--nausea, vomiting, diarrhea, decrease in the amount of urine, dark urine, unusual weakness or fatigue, confusion, muscle pain or cramps, fast or irregular heartbeat, joint pain Side effects that usually do not require medical attention (report to your care team if they continue or are bothersome): Constipation Diarrhea Fatigue Loss of appetite Nausea This list may not describe all possible side effects. Call your doctor for medical advice about side effects. You may report side effects to FDA at 1-800-FDA-1088. Where should I keep my medication? This medication is given in a hospital or clinic. It will not be stored at home. NOTE: This sheet  is a summary. It may not cover all possible information. If you have questions about this medicine, talk to your doctor, pharmacist, or health care provider.  2024 Elsevier/Gold Standard (2022-01-09 00:00:00)       To help prevent nausea and vomiting after your treatment, we encourage you to take your nausea medication as directed.  BELOW ARE SYMPTOMS THAT SHOULD BE REPORTED IMMEDIATELY: *FEVER GREATER THAN 100.4 F (38 C) OR HIGHER *CHILLS OR SWEATING *NAUSEA AND VOMITING THAT IS NOT  CONTROLLED WITH YOUR NAUSEA MEDICATION *UNUSUAL SHORTNESS OF BREATH *UNUSUAL BRUISING OR BLEEDING *URINARY PROBLEMS (pain or burning when urinating, or frequent urination) *BOWEL PROBLEMS (unusual diarrhea, constipation, pain near the anus) TENDERNESS IN MOUTH AND THROAT WITH OR WITHOUT PRESENCE OF ULCERS (sore throat, sores in mouth, or a toothache) UNUSUAL RASH, SWELLING OR PAIN  UNUSUAL VAGINAL DISCHARGE OR ITCHING   Items with * indicate a potential emergency and should be followed up as soon as possible or go to the Emergency Department if any problems should occur.  Please show the CHEMOTHERAPY ALERT CARD or IMMUNOTHERAPY ALERT CARD at check-in to the Emergency Department and triage nurse.  Should you have questions after your visit or need to cancel or reschedule your appointment, please contact [facility]  - A DEPT OF [facility] [phone]  and follow the prompts.  Office hours are 8:00 a.m. to 4:30 p.m. Monday - Friday. Please note that voicemails left after 4:00 p.m. may not be returned until the following business day.  We are closed weekends and major holidays. You have access to a nurse at all times for urgent questions. Please call the main number to the clinic [phone] and follow the prompts.  For any non-urgent questions, you may also contact your provider using MyChart. We now offer e-Visits for anyone 29 and older to request care online for non-urgent symptoms. For details visit [email or web address].   Also download the MyChart app! Go to the app store, search "MyChart", open the app, select [place], and log in with your MyChart username and password.

## 2024-02-13 ENCOUNTER — Other Ambulatory Visit: Payer: Self-pay | Admitting: *Deleted

## 2024-02-13 LAB — KAPPA/LAMBDA LIGHT CHAINS
Kappa free light chain: 5.3 mg/L (ref 3.3–19.4)
Kappa, lambda light chain ratio: 0.11 — ABNORMAL LOW (ref 0.26–1.65)
Lambda free light chains: 48.6 mg/L — ABNORMAL HIGH (ref 5.7–26.3)

## 2024-02-13 LAB — PROTEIN ELECTROPHORESIS, SERUM
A/G Ratio: 1.7 (ref 0.7–1.7)
Albumin ELP: 3.5 g/dL (ref 2.9–4.4)
Alpha-1-Globulin: 0.2 g/dL (ref 0.0–0.4)
Alpha-2-Globulin: 0.9 g/dL (ref 0.4–1.0)
Beta Globulin: 0.8 g/dL (ref 0.7–1.3)
Gamma Globulin: 0.3 g/dL — ABNORMAL LOW (ref 0.4–1.8)
Globulin, Total: 2.1 g/dL — ABNORMAL LOW (ref 2.2–3.9)
M-Spike, %: 0.1 g/dL — ABNORMAL HIGH
Total Protein ELP: 5.6 g/dL — ABNORMAL LOW (ref 6.0–8.5)

## 2024-02-13 MED ORDER — ALPRAZOLAM 0.25 MG PO TABS: ORAL_TABLET | ORAL | 2 refills | Status: DC

## 2024-02-17 ENCOUNTER — Other Ambulatory Visit: Payer: Self-pay | Admitting: *Deleted

## 2024-02-17 ENCOUNTER — Other Ambulatory Visit: Payer: Self-pay

## 2024-02-17 MED ORDER — MIDODRINE HCL 2.5 MG PO TABS: 2.5000 mg | ORAL_TABLET | Freq: Three times a day (TID) | ORAL | 1 refills | Status: DC

## 2024-02-23 ENCOUNTER — Other Ambulatory Visit: Payer: Self-pay | Admitting: Hematology

## 2024-02-23 LAB — IFE, DARA-SPECIFIC, SERUM
IgA: 16 mg/dL — ABNORMAL LOW (ref 64–422)
IgG (Immunoglobin G), Serum: 445 mg/dL — ABNORMAL LOW (ref 586–1602)
IgM (Immunoglobulin M), Srm: 17 mg/dL — ABNORMAL LOW (ref 26–217)

## 2024-02-24 ENCOUNTER — Encounter: Payer: Self-pay | Admitting: Hematology

## 2024-02-25 ENCOUNTER — Other Ambulatory Visit: Payer: Self-pay

## 2024-02-25 NOTE — Progress Notes (Signed)
 [facility] [address] [place] [address]    Clinic Day:  02/26/2024  Referring physician: Katrinka Aquas, MD  Patient Care Team: Katrinka Aquas, MD as PCP - General (Internal Medicine) Rogers Hai, MD as Medical Oncologist (Medical Oncology) Cindie Carlin POUR, DO as Consulting Physician (Gastroenterology)   ASSESSMENT & PLAN:   Assessment: 1.  IgG lambda plasma cell myeloma, stage II, standard risk: -4 cycles of KPD from 04/17/2018 through 08/21/2018, stem cell transplant on 10/23/2018. -PET scan on 01/21/2019 showed multiple bone lesions but without any hypermetabolic activity. -BMBX on 01/21/2019 with normocellular marrow with no increase in plasma cells.  Normal FISH.  MRD results negative. -Maintenance pomalidomide  2 mg 3 weeks on/1 week off started on 03/10/2019. -Bone marrow biopsy on 10/29/2019 shows trilineage hematopoiesis with no evidence of plasma cells.  Chromosome analysis and FISH are normal. -BM BX on 10/27/2020 at [facility]-normocellular marrow with 30 to 40%, TLH.  2% of the total cells are CD 138+ plasma cells. - Pomalyst  was held due to dizziness from 03/29/2021 through 04/26/2021. - MRI of the brain on 03/31/2021 did not show any evidence of intracranial lesions.  Multiple bone lesions in the skull compatible with sequela of myeloma.  - Labs at [facility] on 11/16/2021: M spike 0.24 g.  Immunofixation positive for IgG lambda. - PET scan on 12/14/2021: No evidence of FDG avid osseous or soft tissue myeloma. - She has developed left shoulder blade and right posterior rib pain for the last 3 to 4 months. - 24-hour urine on 12/14/2021: Total protein 136 mg.  Immune immunofixation positive for lambda type Bence-Jones protein. -- PET scan (05/24/2022): Single new hypermetabolic lytic lesion involving left lamina of T11.  No canal encroachment.  No other hypermetabolic bone lesions.  Stable diffuse lytic lesions throughout the axial and appendicular skeleton.  No  soft tissue lesions. - Maintenance Pomalyst  discontinued on 05/29/2022 - XRT to the T11 completed on 06/20/2022 - Daratumumab , Velcade  and dexamethasone  started on 06/11/2022   2.  Pulmonary embolism: -CT angiogram on 04/21/2020 showed filling defect at [facility]. -VQ scan confirmed pulmonary embolism. -She is on Eliquis .   3.  Right shoulder and upper arm pain: - She received XRT to the chest from 12/26/2020 through 01/06/2021.   4.  Myeloma bone disease: - Denosumab  held due to exposure of bone in the right lower jaw.  Last dose in 2020.    Plan: 1.  IgG lambda plasma cell myeloma: - She is tolerating Velcade  and monthly Darzalex  very well. - We reviewed myeloma labs from 02/12/2024: M spike is 0.1 g, down from 0.2 g.  Lambda free light chains increased to 48 from 39.  Ratio is 0.11, changed from 0.15. - Will continue Velcade  every 2 weeks and Darzalex  once a month.  She takes dexamethasone  20 mg on the days of Darzalex .  Will see her back in 2 weeks after the scans.   2.  Back pain/left shoulder blade pain/right lateral and posterior rib pains: - Continue hydrocodone  10/325 every 6 hours as needed. - She reported worsening lower back pain recently. - Will obtain MRI of the thoracic and lumbar spine with and without contrast.   3.  Hypomagnesemia: - Continue magnesium  once daily.  Magnesium  is normal.   4.  Anxiety: - Continue Xanax  0.5 mg twice daily as needed.   5.  Sleeping difficulty: - Continue Ambien  daily as needed as it is helping.   6.  Peripheral  neuropathy: - Neuropathy in the feet is well-controlled with gabapentin  300 mg 3 times daily.  No worsening noted.   7.  Pulmonary embolism: - Continue Eliquis  twice daily.  No bleeding issues.    Orders Placed This Encounter  Procedures   MR Thoracic Spine W Wo Contrast    Standing Status:   Future    Expiration Date:   02/25/2025    GRA to provide read?:   Yes    If indicated for the ordered procedure, I  authorize the administration of contrast media per Radiology protocol:   Yes    What is the patient's sedation requirement?:   No Sedation    Use SRS Protocol?:   No    Does the patient have a pacemaker or implanted devices?:   No    Preferred imaging location?:   [facility] (table limit - 500lbs)   Magnesium     Standing Status:   Future    Expected Date:   03/25/2024    Expiration Date:   03/25/2025   Magnesium     Standing Status:   Future    Expected Date:   04/08/2024    Expiration Date:   04/08/2025      Alexandra Price,acting as a scribe for Alean Stands, MD.,have documented all relevant documentation on the behalf of Alean Stands, MD,as directed by  Alean Stands, MD while in the presence of Alean Stands, MD.  I, Alean Stands MD, have reviewed the above documentation for accuracy and completeness, and I agree with the above.     Alean Stands, MD   7/9/202512:46 PM  CHIEF COMPLAINT:   Diagnosis: multiple myeloma    Cancer Staging  No matching staging information was found for the patient.    Prior Therapy: 1. KPD x 4 cycles from 04/17/2018 to 08/21/2018. 2. Stem cell transplant on 10/23/2018. 3. Radiation to left and right humerus 25 Gy in 10 fractions from 08/23/2020 to 08/25/2020. 4.  Maintenance Pomalyst  until 06/08/2022  Current Therapy:  Darzalex , Velcade  and dexamethasone  started on 06/11/2022    HISTORY OF PRESENT ILLNESS:   Oncology History  Multiple myeloma not having achieved remission (HCC)  04/07/2018 Initial Diagnosis   Multiple myeloma not having achieved remission (HCC)   06/11/2022 -  Chemotherapy   Patient is on Treatment Plan : MYELOMA RELAPSED / REFRACTORY Daratumumab  SQ + Bortezomib  + Dexamethasone  (DaraVd) q21d / Daratumumab  SQ q28d      Multiple myeloma without remission (HCC)  04/15/2018 Initial Diagnosis   Multiple myeloma without remission (HCC)   04/17/2018 - 09/05/2018 Chemotherapy   The  patient had dexamethasone  (DECADRON ) 4 MG tablet, 1 of 1 cycle, Start date: 04/15/2018, End date: 05/26/2018 palonosetron  (ALOXI ) injection 0.25 mg, 0.25 mg, Intravenous,  Once, 1 of 1 cycle Administration: 0.25 mg (04/17/2018), 0.25 mg (04/24/2018), 0.25 mg (05/01/2018) cyclophosphamide  (CYTOXAN ) 540 mg in sodium chloride  0.9 % 250 mL chemo infusion, 300 mg/m2 = 540 mg, Intravenous,  Once, 1 of 1 cycle Administration: 540 mg (04/17/2018), 540 mg (04/24/2018), 540 mg (05/01/2018) carfilzomib  (KYPROLIS ) 36 mg in dextrose  5 % 50 mL chemo infusion, 20 mg/m2 = 36 mg, Intravenous, Once, 5 of 5 cycles Administration: 36 mg (04/17/2018), 36 mg (04/18/2018), 60 mg (04/24/2018), 60 mg (04/25/2018), 60 mg (05/01/2018), 60 mg (05/02/2018), 60 mg (05/15/2018), 60 mg (05/16/2018), 60 mg (06/12/2018), 60 mg (06/13/2018), 60 mg (06/26/2018), 60 mg (06/27/2018), 60 mg (07/03/2018), 60 mg (07/04/2018), 60 mg (07/24/2018), 60 mg (07/25/2018), 60 mg (07/31/2018), 60 mg (08/01/2018),  60 mg (08/08/2018), 60 mg (08/07/2018), 60 mg (08/21/2018), 60 mg (08/22/2018), 60 mg (08/28/2018), 60 mg (08/29/2018), 60 mg (09/04/2018), 60 mg (09/05/2018)  for chemotherapy treatment.    06/11/2022 -  Chemotherapy   Patient is on Treatment Plan : MYELOMA RELAPSED / REFRACTORY Daratumumab  SQ + Bortezomib  + Dexamethasone  (DaraVd) q21d / Daratumumab  SQ q28d         INTERVAL HISTORY:   Alexandra Price is a 78 y.o. female presenting to clinic today for follow up of multiple myeloma. She was last seen by me on 01/29/24.  Today, she states that she is doing well overall. Her appetite level is at 100%. Her energy level is at 40%.  PAST MEDICAL HISTORY:   Past Medical History: Past Medical History:  Diagnosis Date   Anxiety    Chronic kidney disease    Depression    Hypertension    Multiple myeloma (HCC)    multiple myeloma   Pre-diabetes     Surgical History: Past Surgical History:  Procedure Laterality Date   ABDOMINAL HYSTERECTOMY     total   APPENDECTOMY      BALLOON DILATION N/A 05/28/2022   Procedure: BALLOON DILATION;  Surgeon: Cindie Carlin POUR, DO;  [facility];  Service: Endoscopy;  Laterality: N/A;   BIOPSY  05/28/2022   Procedure: BIOPSY;  Surgeon: Cindie Carlin POUR, DO;  [facility]: [facility];  Service: Endoscopy;;   COLONOSCOPY WITH PROPOFOL  N/A 12/25/2021   Procedure: COLONOSCOPY WITH PROPOFOL ;  Surgeon: Cindie Carlin POUR, DO;  [facility]: [facility];  Service: Endoscopy;  Laterality: N/A;  2:30pm   ESOPHAGOGASTRODUODENOSCOPY (EGD) WITH PROPOFOL  N/A 05/28/2022   Procedure: ESOPHAGOGASTRODUODENOSCOPY (EGD) WITH PROPOFOL ;  Surgeon: Cindie Carlin POUR, DO;  [facility]: [facility];  Service: Endoscopy;  Laterality: N/A;  10:00am, asa 3   LAPAROSCOPIC APPENDECTOMY N/A 05/20/2018   Procedure: APPENDECTOMY LAPAROSCOPIC;  Surgeon: Mavis Anes, MD;  Location: [facility];  Service: General;  Laterality: N/A;   POLYPECTOMY  12/25/2021   Procedure: POLYPECTOMY;  Surgeon: Cindie Carlin POUR, DO;  [facility]: [facility];  Service: Endoscopy;;   PORTACATH PLACEMENT Right 04/14/2018   Procedure: INSERTION PORT-A-CATH;  Surgeon: Mavis Anes, MD;  Location: [facility];  Service: General;  Laterality: Right;    Social History: Social History   Socioeconomic History   Marital status: Divorced    Spouse name: Not on file   Number of children: 6   Years of education: Not on file   Highest education level: Not on file  Occupational History    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  Tobacco Use   Smoking status: Never   Smokeless tobacco: Never  Vaping Use   Vaping status: Never Used  Substance and Sexual Activity   Alcohol use: Never   Drug use: Not Currently   Sexual activity: Not Currently  Other Topics Concern   Not on file  Social History Narrative   Not on file   Social Drivers of Health   Financial Resource Strain: Low Risk  (07/06/2020)   Overall Financial Resource Strain (CARDIA)    Difficulty of Paying Living Expenses: Not hard at  all  Food Insecurity: No Food Insecurity (09/17/2022)   Hunger Vital Sign    Worried About Running Out of Food in the Last Year: Never true    Ran Out of Food in the Last Year: Never true  Transportation Needs: No Transportation Needs (09/17/2022)   PRAPARE - Administrator, Civil Service (Medical): No  Lack of Transportation (Non-Medical): No  Physical Activity: Inactive (07/06/2020)   Exercise Vital Sign    Days of Exercise per Week: 0 days    Minutes of Exercise per Session: 0 min  Stress: No Stress Concern Present (07/06/2020)   [employer] of Occupational Health - Occupational Stress Questionnaire    Feeling of Stress : Not at all  Social Connections: Moderately Isolated (07/06/2020)   Social Connection and Isolation Panel    Frequency of Communication with Friends and Family: More than three times a week    Frequency of Social Gatherings with Friends and Family: Twice a week    Attends Religious Services: 1 to 4 times per year    Active Member of [employer] or Organizations: No    Attends Banker Meetings: Never    Marital Status: Separated  Intimate Partner Violence: Not At Risk (09/17/2022)   Humiliation, Afraid, Rape, and Kick questionnaire    Fear of Current or Ex-Partner: No    Emotionally Abused: No    Physically Abused: No    Sexually Abused: No    Family History: Family History  Problem Relation Age of Onset   Heart disease Mother    Emphysema Father    Diabetes Sister    Depression Sister    Cancer Brother        liver, lung, and colon. colon cancer at age 77.   Diabetes Brother     Current Medications:  Current Outpatient Medications:    acetaminophen  (TYLENOL ) 325 MG tablet, Take 650 mg by mouth as needed for moderate pain (prior to infusion)., Disp: , Rfl:    acyclovir  (ZOVIRAX ) 400 MG tablet, Take 1 tablet (400 mg total) by mouth 2 (two) times daily., Disp: 60 tablet, Rfl: 6   ALPRAZolam  (XANAX ) 0.25 MG tablet, Take one  tablet (0.25 mg) by mouth in the morning as needed, and two tablets (0.5 mg) at bedtime as needed, Disp: 90 tablet, Rfl: 2   Calcium  Carb-Cholecalciferol  (CALCIUM  1000 + D PO), Take 1,000 mg by mouth daily., Disp: , Rfl:    ciclopirox (PENLAC) 8 % solution, Apply topically daily., Disp: , Rfl:    dexAMETHasone  20 MG TABS, Take 1 tablet by mouth as directed. Take 1 tablet 30 minutes prior to infusions (Patient taking differently: Take 1 tablet by mouth as directed. Take 1 tablet 30 minutes prior to the Darzelex injections-once a month), Disp: 30 tablet, Rfl: 2   diclofenac  Sodium (VOLTAREN ) 1 % GEL, 4 gram every 8 hours as needed., Disp: 100 g, Rfl: 11   diphenhydrAMINE  (BENADRYL ) 25 MG tablet, Take 50 mg by mouth as needed (prior to infusion)., Disp: , Rfl:    ELIQUIS  2.5 MG TABS tablet, Take 2.5 mg by mouth 2 (two) times daily., Disp: , Rfl:    escitalopram  (LEXAPRO ) 20 MG tablet, Take 1 tablet by mouth once daily, Disp: 30 tablet, Rfl: 0   furosemide  (LASIX ) 20 MG tablet, Take 1 tablet (20 mg total) by mouth as needed (in am as needed for swelling)., Disp: 30 tablet, Rfl: 2   gabapentin  (NEURONTIN ) 300 MG capsule, Take 1 capsule (300 mg total) by mouth 3 (three) times daily., Disp: 90 capsule, Rfl: 0   glipiZIDE  (GLUCOTROL ) 5 MG tablet, Take 1 tablet (5 mg total) by mouth daily before breakfast., Disp: 90 tablet, Rfl: 0   HYDROcodone -acetaminophen  (NORCO) 10-325 MG tablet, Take 1 tablet by mouth every 6 (six) hours as needed., Disp: 120 tablet, Rfl: 0   ketoconazole (NIZORAL)  2 % cream, Apply topically 2 (two) times daily., Disp: , Rfl:    lidocaine -prilocaine  (EMLA ) cream, Apply 1 Application topically as needed. 1 squeeze every 8 hours, Disp: 30 g, Rfl: 11   magnesium  oxide (MAG-OX) 400 (240 Mg) MG tablet, Take 1 tablet by mouth 2 (two) times daily., Disp: , Rfl:    midodrine  (PROAMATINE ) 2.5 MG tablet, Take 1 tablet (2.5 mg total) by mouth 3 (three) times daily with meals., Disp: 90 tablet, Rfl:  1   Multiple Vitamin (MULTI VITAMIN) TABS, Take 1 tablet by mouth daily., Disp: , Rfl:    pantoprazole  (PROTONIX ) 40 MG tablet, Take 1 tablet by mouth once daily, Disp: 90 tablet, Rfl: 1   polyethylene glycol (MIRALAX  / GLYCOLAX ) 17 g packet, Take 17 g by mouth daily as needed for moderate constipation., Disp: 14 each, Rfl: 0   prochlorperazine  (COMPAZINE ) 10 MG tablet, Take 1 tablet (10 mg total) by mouth every 6 (six) hours as needed for nausea or vomiting., Disp: 30 tablet, Rfl: 0   rosuvastatin  (CRESTOR ) 10 MG tablet, Take 10 mg by mouth daily., Disp: , Rfl:    Semaglutide (OZEMPIC, 0.25 OR 0.5 MG/DOSE, [place]), Inject 0.25 mg into the skin once a week., Disp: , Rfl:    zolpidem  (AMBIEN ) 10 MG tablet, Take 1 tablet (10 mg total) by mouth at bedtime as needed., Disp: 30 tablet, Rfl: 5   clotrimazole-betamethasone (LOTRISONE) cream, APPLY CREAM EXTERNALY TWICE A DAY FOR 30 DAYS; Duration: 30, Disp: , Rfl:    Allergies: Allergies  Allergen Reactions   Ciprofloxacin  Anaphylaxis   Amoxicillin  Other (See Comments)    unknown   Morphine And Codeine Nausea And Vomiting   Augmentin [Amoxicillin -Pot Clavulanate] Other (See Comments)    Headache, insomnia    REVIEW OF SYSTEMS:   Review of Systems  Constitutional:  Negative for chills, fatigue and fever.  HENT:   Positive for trouble swallowing. Negative for lump/mass, mouth sores, nosebleeds and sore throat.   Eyes:  Negative for eye problems.  Respiratory:  Positive for shortness of breath. Negative for cough.   Cardiovascular:  Negative for chest pain, leg swelling and palpitations.  Gastrointestinal:  Positive for diarrhea and nausea. Negative for abdominal pain, constipation and vomiting.  Genitourinary:  Negative for bladder incontinence, difficulty urinating, dysuria, frequency, hematuria and nocturia.   Musculoskeletal:  Negative for arthralgias, back pain, flank pain, myalgias and neck pain.  Skin:  Negative for itching and rash.   Neurological:  Negative for dizziness, headaches and numbness.  Hematological:  Does not bruise/bleed easily.  Psychiatric/Behavioral:  Positive for depression. Negative for sleep disturbance and suicidal ideas. The patient is nervous/anxious.   All other systems reviewed and are negative.    VITALS:   There were no vitals taken for this visit.  Wt Readings from Last 3 Encounters:  02/26/24 185 lb 13.6 oz (84.3 kg)  02/12/24 186 lb 4.6 oz (84.5 kg)  01/29/24 187 lb 6.3 oz (85 kg)    There is no height or weight on file to calculate BMI.  Performance status (ECOG): 1 - Symptomatic but completely ambulatory  PHYSICAL EXAM:   Physical Exam Vitals and nursing note reviewed. Exam conducted with a chaperone present.  Constitutional:      Appearance: Normal appearance.  Cardiovascular:     Rate and Rhythm: Normal rate and regular rhythm.     Pulses: Normal pulses.     Heart sounds: Normal heart sounds.  Pulmonary:     Effort: Pulmonary effort  is normal.     Breath sounds: Normal breath sounds.  Abdominal:     Palpations: Abdomen is soft. There is no hepatomegaly, splenomegaly or mass.     Tenderness: There is no abdominal tenderness.  Musculoskeletal:     Right lower leg: No edema.     Left lower leg: No edema.  Lymphadenopathy:     Cervical: No cervical adenopathy.     Right cervical: No superficial, deep or posterior cervical adenopathy.    Left cervical: No superficial, deep or posterior cervical adenopathy.     Upper Body:     Right upper body: No supraclavicular or axillary adenopathy.     Left upper body: No supraclavicular or axillary adenopathy.  Neurological:     General: No focal deficit present.     Mental Status: She is alert and oriented to person, place, and time.  Psychiatric:        Mood and Affect: Mood normal.        Behavior: Behavior normal.     LABS:   CBC     Component Value Date/Time   WBC 3.7 (L) 02/26/2024 0934   RBC 3.10 (L) 02/26/2024  0934   HGB 9.8 (L) 02/26/2024 0934   HGB 9.4 (L) 12/15/2018 1244   HCT 31.0 (L) 02/26/2024 0934   PLT 152 02/26/2024 0934   MCV 100.0 02/26/2024 0934   MCH 31.6 02/26/2024 0934   MCHC 31.6 02/26/2024 0934   RDW 13.2 02/26/2024 0934   LYMPHSABS 0.7 02/26/2024 0934   MONOABS 0.4 02/26/2024 0934   EOSABS 0.1 02/26/2024 0934   BASOSABS 0.0 02/26/2024 0934    CMP      Component Value Date/Time   NA 138 02/26/2024 0934   K 4.3 02/26/2024 0934   CL 101 02/26/2024 0934   CO2 27 02/26/2024 0934   GLUCOSE 121 (H) 02/26/2024 0934   BUN 24 (H) 02/26/2024 0934   CREATININE 1.61 (H) 02/26/2024 0934   CALCIUM  8.6 (L) 02/26/2024 0934   PROT 6.5 02/26/2024 0934   ALBUMIN  3.8 02/26/2024 0934   AST 23 02/26/2024 0934   ALT 24 02/26/2024 0934   ALKPHOS 82 02/26/2024 0934   BILITOT 0.4 02/26/2024 0934   GFRNONAA 33 (L) 02/26/2024 0934   GFRAA 30 (L) 05/02/2020 0939     No results found for: CEA1, CEA / No results found for: CEA1, CEA No results found for: PSA1 No results found for: CAN199 No results found for: CAN125  Lab Results  Component Value Date   TOTALPROTELP 5.6 (L) 02/12/2024   ALBUMINELP 3.5 02/12/2024   A1GS 0.2 02/12/2024   A2GS 0.9 02/12/2024   BETS 0.8 02/12/2024   GAMS 0.3 (L) 02/12/2024   MSPIKE 0.1 (H) 02/12/2024   SPEI Comment 02/12/2024   Lab Results  Component Value Date   TIBC 290 09/17/2022   TIBC 278 07/30/2022   TIBC 272 05/24/2022   FERRITIN 1,059 (H) 09/17/2022   FERRITIN 313 (H) 07/30/2022   FERRITIN 435 (H) 05/24/2022   IRONPCTSAT 10 (L) 09/17/2022   IRONPCTSAT 21 07/30/2022   IRONPCTSAT 27 05/24/2022   Lab Results  Component Value Date   LDH 129 05/24/2022   LDH 135 03/20/2022   LDH 129 12/14/2021     STUDIES:   No results found.

## 2024-02-26 ENCOUNTER — Inpatient Hospital Stay (HOSPITAL_BASED_OUTPATIENT_CLINIC_OR_DEPARTMENT_OTHER): Admitting: Hematology

## 2024-02-26 ENCOUNTER — Inpatient Hospital Stay

## 2024-02-26 ENCOUNTER — Encounter: Payer: Self-pay | Admitting: Hematology

## 2024-02-26 ENCOUNTER — Inpatient Hospital Stay: Attending: Hematology

## 2024-02-26 VITALS — BP 131/69 | HR 81 | Temp 98.5°F | Resp 16 | Wt 185.8 lb

## 2024-02-26 DIAGNOSIS — C9000 Multiple myeloma not having achieved remission: Secondary | ICD-10-CM | POA: Diagnosis not present

## 2024-02-26 DIAGNOSIS — C9002 Multiple myeloma in relapse: Secondary | ICD-10-CM | POA: Insufficient documentation

## 2024-02-26 DIAGNOSIS — F419 Anxiety disorder, unspecified: Secondary | ICD-10-CM | POA: Insufficient documentation

## 2024-02-26 DIAGNOSIS — Z5112 Encounter for antineoplastic immunotherapy: Secondary | ICD-10-CM | POA: Insufficient documentation

## 2024-02-26 DIAGNOSIS — Z86711 Personal history of pulmonary embolism: Secondary | ICD-10-CM | POA: Diagnosis not present

## 2024-02-26 DIAGNOSIS — G629 Polyneuropathy, unspecified: Secondary | ICD-10-CM | POA: Insufficient documentation

## 2024-02-26 DIAGNOSIS — Z95828 Presence of other vascular implants and grafts: Secondary | ICD-10-CM

## 2024-02-26 DIAGNOSIS — Z7901 Long term (current) use of anticoagulants: Secondary | ICD-10-CM | POA: Diagnosis not present

## 2024-02-26 LAB — CBC WITH DIFFERENTIAL/PLATELET
Abs Immature Granulocytes: 0.01 K/uL (ref 0.00–0.07)
Basophils Absolute: 0 K/uL (ref 0.0–0.1)
Basophils Relative: 1 %
Eosinophils Absolute: 0.1 K/uL (ref 0.0–0.5)
Eosinophils Relative: 4 %
HCT: 31 % — ABNORMAL LOW (ref 36.0–46.0)
Hemoglobin: 9.8 g/dL — ABNORMAL LOW (ref 12.0–15.0)
Immature Granulocytes: 0 %
Lymphocytes Relative: 18 %
Lymphs Abs: 0.7 K/uL (ref 0.7–4.0)
MCH: 31.6 pg (ref 26.0–34.0)
MCHC: 31.6 g/dL (ref 30.0–36.0)
MCV: 100 fL (ref 80.0–100.0)
Monocytes Absolute: 0.4 K/uL (ref 0.1–1.0)
Monocytes Relative: 12 %
Neutro Abs: 2.4 K/uL (ref 1.7–7.7)
Neutrophils Relative %: 65 %
Platelets: 152 K/uL (ref 150–400)
RBC: 3.1 MIL/uL — ABNORMAL LOW (ref 3.87–5.11)
RDW: 13.2 % (ref 11.5–15.5)
WBC: 3.7 K/uL — ABNORMAL LOW (ref 4.0–10.5)
nRBC: 0 % (ref 0.0–0.2)

## 2024-02-26 LAB — MAGNESIUM: Magnesium: 2 mg/dL (ref 1.7–2.4)

## 2024-02-26 LAB — COMPREHENSIVE METABOLIC PANEL WITH GFR
ALT: 24 U/L (ref 0–44)
AST: 23 U/L (ref 15–41)
Albumin: 3.8 g/dL (ref 3.5–5.0)
Alkaline Phosphatase: 82 U/L (ref 38–126)
Anion gap: 10 (ref 5–15)
BUN: 24 mg/dL — ABNORMAL HIGH (ref 8–23)
CO2: 27 mmol/L (ref 22–32)
Calcium: 8.6 mg/dL — ABNORMAL LOW (ref 8.9–10.3)
Chloride: 101 mmol/L (ref 98–111)
Creatinine, Ser: 1.61 mg/dL — ABNORMAL HIGH (ref 0.44–1.00)
GFR, Estimated: 33 mL/min — ABNORMAL LOW (ref >60)
Glucose, Bld: 121 mg/dL — ABNORMAL HIGH (ref 70–99)
Potassium: 4.3 mmol/L (ref 3.5–5.1)
Sodium: 138 mmol/L (ref 135–145)
Total Bilirubin: 0.4 mg/dL (ref 0.0–1.2)
Total Protein: 6.5 g/dL (ref 6.5–8.1)

## 2024-02-26 MED ORDER — HEPARIN SOD (PORK) LOCK FLUSH 100 UNIT/ML IV SOLN
500.0000 [IU] | Freq: Once | INTRAVENOUS | Status: AC
  Administered 2024-02-26: 500 [IU] via INTRAVENOUS

## 2024-02-26 MED ORDER — BORTEZOMIB CHEMO SQ INJECTION 3.5 MG (2.5MG/ML)
1.0000 mg/m2 | Freq: Once | INTRAMUSCULAR | Status: AC
  Administered 2024-02-26: 2 mg via SUBCUTANEOUS
  Filled 2024-02-26: qty 0.8

## 2024-02-26 MED ORDER — DARATUMUMAB-HYALURONIDASE-FIHJ 1800-30000 MG-UT/15ML ~~LOC~~ SOLN
1800.0000 mg | Freq: Once | SUBCUTANEOUS | Status: AC
  Administered 2024-02-26: 1800 mg via SUBCUTANEOUS
  Filled 2024-02-26: qty 15

## 2024-02-26 MED ORDER — SODIUM CHLORIDE FLUSH 0.9 % IV SOLN
10.0000 mL | Freq: Once | INTRAVENOUS | Status: AC
  Administered 2024-02-26: 10 mL via INTRAVENOUS
  Filled 2024-02-26: qty 10

## 2024-02-26 NOTE — Patient Instructions (Signed)
 [facility] - A DEPT OF [facility]  Discharge Instructions: Thank you for choosing [facility] to provide your oncology and hematology care.  If you have a lab appointment with the Cancer Center - please note that after April 8th, 2024, all labs will be drawn in the cancer center.  You do not have to check in or register with the main entrance as you have in the past but will complete your check-in in the cancer center.  Wear comfortable clothing and clothing appropriate for easy access to any Portacath or PICC line.   We strive to give you quality time with your provider. You may need to reschedule your appointment if you arrive late (15 or more minutes).  Arriving late affects you and other patients whose appointments are after yours.  Also, if you miss three or more appointments without notifying the office, you may be dismissed from the clinic at the provider's discretion.      For prescription refill requests, have your pharmacy contact our office and allow 72 hours for refills to be completed.    Today you received the following chemotherapy and/or immunotherapy agents Darzalex  Faspro/ Velcade    To help prevent nausea and vomiting after your treatment, we encourage you to take your nausea medication as directed.  Daratumumab ; Hyaluronidase  Injection What is this medication? DARATUMUMAB ; HYALURONIDASE  (dar a toom ue mab; hye al ur ON i dase) treats multiple myeloma, a type of bone marrow cancer. Daratumumab  works by blocking a protein that causes cancer cells to grow and multiply. This helps to slow or stop the spread of cancer cells. Hyaluronidase  works by increasing the absorption of other medications in the body to help them work better. This medication may also be used treat amyloidosis, a condition that causes the buildup of a protein (amyloid) in your body. It works by reducing the buildup of this protein, which decreases symptoms. It is a  combination medication that contains a monoclonal antibody. This medicine may be used for other purposes; ask your health care provider or pharmacist if you have questions. COMMON BRAND NAME(S): DARZALEX  FASPRO What should I tell my care team before I take this medication? They need to know if you have any of these conditions: Heart disease Infection, such as chickenpox, cold sores, herpes, hepatitis B Lung or breathing disease An unusual or allergic reaction to daratumumab , hyaluronidase , other medications, foods, dyes, or preservatives Pregnant or trying to get pregnant Breast-feeding How should I use this medication? This medication is injected under the skin. It is given by your care team in a hospital or clinic setting. Talk to your care team about the use of this medication in children. Special care may be needed. Overdosage: If you think you have taken too much of this medicine contact a poison control center or emergency room at once. NOTE: This medicine is only for you. Do not share this medicine with others. What if I miss a dose? Keep appointments for follow-up doses. It is important not to miss your dose. Call your care team if you are unable to keep an appointment. What may interact with this medication? Interactions have not been studied. This list may not describe all possible interactions. Give your health care provider a list of all the medicines, herbs, non-prescription drugs, or dietary supplements you use. Also tell them if you smoke, drink alcohol, or use illegal drugs. Some items may interact with your medicine. What should I watch  for while using this medication? Your condition will be monitored carefully while you are receiving this medication. This medication can cause serious allergic reactions. To reduce your risk, your care team may give you other medication to take before receiving this one. Be sure to follow the directions from your care team. This medication can  affect the results of blood tests to match your blood type. These changes can last for up to 6 months after the final dose. Your care team will do blood tests to match your blood type before you start treatment. Tell all of your care team that you are being treated with this medication before receiving a blood transfusion. This medication can affect the results of some tests used to determine treatment response; extra tests may be needed to evaluate response. Talk to your care team if you wish to become pregnant or think you are pregnant. This medication can cause serious birth defects if taken during pregnancy and for 3 months after the last dose. A reliable form of contraception is recommended while taking this medication and for 3 months after the last dose. Talk to your care team about effective forms of contraception. Do not breast-feed while taking this medication. What side effects may I notice from receiving this medication? Side effects that you should report to your care team as soon as possible: Allergic reactions--skin rash, itching, hives, swelling of the face, lips, tongue, or throat Heart rhythm changes--fast or irregular heartbeat, dizziness, feeling faint or lightheaded, chest pain, trouble breathing Infection--fever, chills, cough, sore throat, wounds that don't heal, pain or trouble when passing urine, general feeling of discomfort or being unwell Infusion reactions--chest pain, shortness of breath or trouble breathing, feeling faint or lightheaded Sudden eye pain or change in vision such as blurry vision, seeing halos around lights, vision loss Unusual bruising or bleeding Side effects that usually do not require medical attention (report to your care team if they continue or are bothersome): Constipation Diarrhea Fatigue Nausea Pain, tingling, or numbness in the hands or feet Swelling of the ankles, hands, or feet This list may not describe all possible side effects. Call your  doctor for medical advice about side effects. You may report side effects to FDA at 1-800-FDA-1088. Where should I keep my medication? This medication is given in a hospital or clinic. It will not be stored at home. NOTE: This sheet is a summary. It may not cover all possible information. If you have questions about this medicine, talk to your doctor, pharmacist, or health care provider.  2024 Elsevier/Gold Standard (2021-12-12 00:00:00)   Bortezomib  Injection What is this medication? BORTEZOMIB  (bor TEZ oh mib) treats lymphoma. It may also be used to treat multiple myeloma, a type of bone marrow cancer. It works by blocking a protein that causes cancer cells to grow and multiply. This helps to slow or stop the spread of cancer cells. This medicine may be used for other purposes; ask your health care provider or pharmacist if you have questions. COMMON BRAND NAME(S): BORUZU , Velcade  What should I tell my care team before I take this medication? They need to know if you have any of these conditions: Dehydration Diabetes Heart disease Liver disease Tingling of the fingers or toes or other nerve disorder An unusual or allergic reaction to bortezomib , other medications, foods, dyes, or preservatives If you or your partner are pregnant or trying to get pregnant Breastfeeding How should I use this medication? This medication is injected into a vein or under  the skin. It is given by your care team in a hospital or clinic setting. Talk to your care team about the use of this medication in children. Special care may be needed. Overdosage: If you think you have taken too much of this medicine contact a poison control center or emergency room at once. NOTE: This medicine is only for you. Do not share this medicine with others. What if I miss a dose? Keep appointments for follow-up doses. It is important not to miss your dose. Call your care team if you are unable to keep an appointment. What may  interact with this medication? Ketoconazole Rifampin This list may not describe all possible interactions. Give your health care provider a list of all the medicines, herbs, non-prescription drugs, or dietary supplements you use. Also tell them if you smoke, drink alcohol, or use illegal drugs. Some items may interact with your medicine. What should I watch for while using this medication? Your condition will be monitored carefully while you are receiving this medication. You may need blood work while taking this medication. This medication may affect your coordination, reaction time, or judgment. Do not drive or operate machinery until you know how this medication affects you. Sit up or stand slowly to reduce the risk of dizzy or fainting spells. Drinking alcohol with this medication can increase the risk of these side effects. This medication may increase your risk of getting an infection. Call your care team for advice if you get a fever, chills, sore throat, or other symptoms of a cold or flu. Do not treat yourself. Try to avoid being around people who are sick. Check with your care team if you have severe diarrhea, nausea, and vomiting, or if you sweat a lot. The loss of too much body fluid may make it dangerous for you to take this medication. Talk to your care team if you may be pregnant. Serious birth defects can occur if you take this medication during pregnancy and for 7 months after the last dose. You will need a negative pregnancy test before starting this medication. Contraception is recommended while taking this medication and for 7 months after the last dose. Your care team can help you find the option that works for you. If your partner can get pregnant, use a condom during sex while taking this medication and for 4 months after the last dose. Do not breastfeed while taking this medication and for 2 months after the last dose. This medication may cause infertility. Talk to your care team  if you are concerned about your fertility. What side effects may I notice from receiving this medication? Side effects that you should report to your care team as soon as possible: Allergic reactions--skin rash, itching, hives, swelling of the face, lips, tongue, or throat Bleeding--bloody or black, tar-like stools, vomiting blood or brown material that looks like coffee grounds, red or dark brown urine, small red or purple spots on skin, unusual bruising or bleeding Bleeding in the brain--severe headache, stiff neck, confusion, dizziness, change in vision, numbness or weakness of the face, arm, or leg, trouble speaking, trouble walking, vomiting Bowel blockage--stomach cramping, unable to have a bowel movement or pass gas, loss of appetite, vomiting Heart failure--shortness of breath, swelling of the ankles, feet, or hands, sudden weight gain, unusual weakness or fatigue Infection--fever, chills, cough, sore throat, wounds that don't heal, pain or trouble when passing urine, general feeling of discomfort or being unwell Liver injury--right upper belly pain, loss of appetite,  nausea, light-colored stool, dark yellow or brown urine, yellowing skin or eyes, unusual weakness or fatigue Low blood pressure--dizziness, feeling faint or lightheaded, blurry vision Lung injury--shortness of breath or trouble breathing, cough, spitting up blood, chest pain, fever Pain, tingling, or numbness in the hands or feet Severe or prolonged diarrhea Stomach pain, bloody diarrhea, pale skin, unusual weakness or fatigue, decrease in the amount of urine, which may be signs of hemolytic uremic syndrome Sudden and severe headache, confusion, change in vision, seizures, which may be signs of posterior reversible encephalopathy syndrome (PRES) TTP--purple spots on the skin or inside the mouth, pale skin, yellowing skin or eyes, unusual weakness or fatigue, fever, fast or irregular heartbeat, confusion, change in vision,  trouble speaking, trouble walking Tumor lysis syndrome (TLS)--nausea, vomiting, diarrhea, decrease in the amount of urine, dark urine, unusual weakness or fatigue, confusion, muscle pain or cramps, fast or irregular heartbeat, joint pain Side effects that usually do not require medical attention (report to your care team if they continue or are bothersome): Constipation Diarrhea Fatigue Loss of appetite Nausea This list may not describe all possible side effects. Call your doctor for medical advice about side effects. You may report side effects to FDA at 1-800-FDA-1088. Where should I keep my medication? This medication is given in a hospital or clinic. It will not be stored at home. NOTE: This sheet is a summary. It may not cover all possible information. If you have questions about this medicine, talk to your doctor, pharmacist, or health care provider.  2024 Elsevier/Gold Standard (2022-01-09 00:00:00)  BELOW ARE SYMPTOMS THAT SHOULD BE REPORTED IMMEDIATELY: *FEVER GREATER THAN 100.4 F (38 C) OR HIGHER *CHILLS OR SWEATING *NAUSEA AND VOMITING THAT IS NOT CONTROLLED WITH YOUR NAUSEA MEDICATION *UNUSUAL SHORTNESS OF BREATH *UNUSUAL BRUISING OR BLEEDING *URINARY PROBLEMS (pain or burning when urinating, or frequent urination) *BOWEL PROBLEMS (unusual diarrhea, constipation, pain near the anus) TENDERNESS IN MOUTH AND THROAT WITH OR WITHOUT PRESENCE OF ULCERS (sore throat, sores in mouth, or a toothache) UNUSUAL RASH, SWELLING OR PAIN  UNUSUAL VAGINAL DISCHARGE OR ITCHING   Items with * indicate a potential emergency and should be followed up as soon as possible or go to the Emergency Department if any problems should occur.  Please show the CHEMOTHERAPY ALERT CARD or IMMUNOTHERAPY ALERT CARD at check-in to the Emergency Department and triage nurse.  Should you have questions after your visit or need to cancel or reschedule your appointment, please contact [facility] [place] - A  DEPT OF [facility] [phone]  and follow the prompts.  Office hours are 8:00 a.m. to 4:30 p.m. Monday - Friday. Please note that voicemails left after 4:00 p.m. may not be returned until the following business day.  We are closed weekends and major holidays. You have access to a nurse at all times for urgent questions. Please call the main number to the clinic [phone] and follow the prompts.  For any non-urgent questions, you may also contact your provider using MyChart. We now offer e-Visits for anyone 57 and older to request care online for non-urgent symptoms. For details visit [email or web address].   Also download the MyChart app! Go to the app store, search MyChart, open the app, select [facility], and log in with your MyChart username and password.

## 2024-02-26 NOTE — Patient Instructions (Signed)

## 2024-02-26 NOTE — Progress Notes (Signed)
 Patient presents today for Darzalex Faspro/Velcade infusion. Patient is in satisfactory condition with no new complaints voiced.  Vital signs are stable.  Labs reviewed by Dr. Ellin Saba during the office visit and all labs are within treatment parameters.  We will proceed with treatment per MD orders.   Patient took pre-meds at home prior to arrival.  Treatment given today per MD orders. Tolerated infusion without adverse affects. Vital signs stable. No complaints at this time. Discharged from clinic ambulatory in stable condition. Alert and oriented x 3. F/U with [facility] as scheduled.

## 2024-02-26 NOTE — Progress Notes (Signed)
 Patient has been examined by Dr. Rogers. Vital signs and labs have been reviewed by MD - ANC, Creatinine (1.61), LFTs, hemoglobin, and platelets are within treatment parameters per M.D. - pt may proceed with treatment.  Primary RN and pharmacy notified.

## 2024-03-04 ENCOUNTER — Ambulatory Visit (HOSPITAL_COMMUNITY)
Admission: RE | Admit: 2024-03-04 | Discharge: 2024-03-04 | Disposition: A | Discharge status: Home / Self Care | Source: Ambulatory Visit | Attending: Hematology | Admitting: Hematology

## 2024-03-04 ENCOUNTER — Other Ambulatory Visit: Payer: Self-pay | Admitting: *Deleted

## 2024-03-04 DIAGNOSIS — C9000 Multiple myeloma not having achieved remission: Secondary | ICD-10-CM | POA: Insufficient documentation

## 2024-03-04 MED ORDER — GABAPENTIN 300 MG PO CAPS: 300.0000 mg | ORAL_CAPSULE | Freq: Three times a day (TID) | ORAL | 0 refills | Status: AC

## 2024-03-04 MED ORDER — GADOBUTROL 1 MMOL/ML IV SOLN
10.0000 mL | Freq: Once | INTRAVENOUS | Status: AC | PRN
  Administered 2024-03-04: 10 mL via INTRAVENOUS

## 2024-03-05 ENCOUNTER — Other Ambulatory Visit: Payer: Self-pay | Admitting: *Deleted

## 2024-03-05 DIAGNOSIS — C9000 Multiple myeloma not having achieved remission: Secondary | ICD-10-CM

## 2024-03-05 MED ORDER — HYDROCODONE-ACETAMINOPHEN 10-325 MG PO TABS: 1.0000 | ORAL_TABLET | Freq: Four times a day (QID) | ORAL | 0 refills | Status: DC | PRN

## 2024-03-09 ENCOUNTER — Other Ambulatory Visit: Payer: Self-pay

## 2024-03-10 NOTE — Progress Notes (Signed)
 [facility] [address] [place] [address]    Clinic Day:  03/11/2024  Referring physician: Katrinka Aquas, MD  Patient Care Team: Katrinka Aquas, MD as PCP - General (Internal Medicine) Rogers Hai, MD as Medical Oncologist (Medical Oncology) Cindie Carlin POUR, DO as Consulting Physician (Gastroenterology)   ASSESSMENT & PLAN:   Assessment: 1.  IgG lambda plasma cell myeloma, stage II, standard risk: -4 cycles of KPD from 04/17/2018 through 08/21/2018, stem cell transplant on 10/23/2018. -PET scan on 01/21/2019 showed multiple bone lesions but without any hypermetabolic activity. -BMBX on 01/21/2019 with normocellular marrow with no increase in plasma cells.  Normal FISH.  MRD results negative. -Maintenance pomalidomide  2 mg 3 weeks on/1 week off started on 03/10/2019. -Bone marrow biopsy on 10/29/2019 shows trilineage hematopoiesis with no evidence of plasma cells.  Chromosome analysis and FISH are normal. -BM BX on 10/27/2020 at [facility]-normocellular marrow with 30 to 40%, TLH.  2% of the total cells are CD 138+ plasma cells. - Pomalyst  was held due to dizziness from 03/29/2021 through 04/26/2021. - MRI of the brain on 03/31/2021 did not show any evidence of intracranial lesions.  Multiple bone lesions in the skull compatible with sequela of myeloma.  - Labs at [facility] on 11/16/2021: M spike 0.24 g.  Immunofixation positive for IgG lambda. - PET scan on 12/14/2021: No evidence of FDG avid osseous or soft tissue myeloma. - She has developed left shoulder blade and right posterior rib pain for the last 3 to 4 months. - 24-hour urine on 12/14/2021: Total protein 136 mg.  Immune immunofixation positive for lambda type Bence-Jones protein. -- PET scan (05/24/2022): Single new hypermetabolic lytic lesion involving left lamina of T11.  No canal encroachment.  No other hypermetabolic bone lesions.  Stable diffuse lytic lesions throughout the axial and appendicular skeleton.  No  soft tissue lesions. - Maintenance Pomalyst  discontinued on 05/29/2022 - XRT to the T11 completed on 06/20/2022 - Daratumumab , Velcade  and dexamethasone  started on 06/11/2022   2.  Pulmonary embolism: -CT angiogram on 04/21/2020 showed filling defect at [facility]. -VQ scan confirmed pulmonary embolism. -She is on Eliquis .   3.  Right shoulder and upper arm pain: - She received XRT to the chest from 12/26/2020 through 01/06/2021.   4.  Myeloma bone disease: - Denosumab  held due to exposure of bone in the right lower jaw.  Last dose in 2020.    Plan: 1.  IgG lambda plasma cell myeloma: - She is tolerating Velcade  and monthly Darzalex  reasonably well. - Myeloma labs (02/12/2024): M spike 0.1 g, down from 0.2 g.  Lambda light chains increased to 48 from 39.  Ratio 0.11, changed from 0.15. - Will continue Velcade  every 2 weeks and Darzalex  once a month.  She takes dexamethasone  20 mg on the days of Darzalex . - Recommend follow-up in 8 weeks with myeloma labs 2 weeks prior. - If there is any progression, consider to get in touch with Dr. Arman to assess candidacy for CAR-T/bispecifics.   2.  Back pain/left shoulder blade pain/right lateral and posterior rib pains: - At last visit she complained of worsening back pain. - We reviewed MRI thoracic spine from 02/28/2024: No myeloma bone disease.  Severe degenerative disc disease at L1-L2 and old compression fracture at T11. - Continue hydrocodone  10/325 every 6 hours as needed.   3.  Hypomagnesemia: - Continue magnesium  once daily.  Magnesium  is normal.   4.  Anxiety: - Continue Xanax   0.5 mg twice daily as needed.   5.  Sleeping difficulty: - Ambien  daily as needed as it is helping.   6.  Peripheral neuropathy: - Neuropathy in the feet is well-controlled with gabapentin  300 mg 3 times daily.  No worsening noted.   7.  Pulmonary embolism: - Continue Eliquis  twice daily.  No bleeding issues.    Orders Placed This Encounter   Procedures   Kappa/lambda light chains    Standing Status:   Future    Expected Date:   04/08/2024    Expiration Date:   07/07/2024   Protein electrophoresis, serum    Standing Status:   Future    Expected Date:   04/08/2024    Expiration Date:   07/07/2024      [name],acting as a scribe for Alean Stands, MD.,have documented all relevant documentation on the behalf of Alean Stands, MD,as directed by  Alean Stands, MD while in the presence of Alean Stands, MD.  I, Alean Stands MD, have reviewed the above documentation for accuracy and completeness, and I agree with the above.      Alean Stands, MD   7/23/202512:45 PM  CHIEF COMPLAINT:   Diagnosis: multiple myeloma    Cancer Staging  No matching staging information was found for the patient.    Prior Therapy: 1. KPD x 4 cycles from 04/17/2018 to 08/21/2018. 2. Stem cell transplant on 10/23/2018. 3. Radiation to left and right humerus 25 Gy in 10 fractions from 08/23/2020 to 08/25/2020. 4.  Maintenance Pomalyst  until 06/08/2022  Current Therapy:  Darzalex , Velcade  and dexamethasone  started on 06/11/2022    HISTORY OF PRESENT ILLNESS:   Oncology History  Multiple myeloma not having achieved remission (HCC)  04/07/2018 Initial Diagnosis   Multiple myeloma not having achieved remission (HCC)   06/11/2022 -  Chemotherapy   Patient is on Treatment Plan : MYELOMA RELAPSED / REFRACTORY Daratumumab  SQ + Bortezomib  + Dexamethasone  (DaraVd) q21d / Daratumumab  SQ q28d      Multiple myeloma without remission (HCC)  04/15/2018 Initial Diagnosis   Multiple myeloma without remission (HCC)   04/17/2018 - 09/05/2018 Chemotherapy   The patient had dexamethasone  (DECADRON ) 4 MG tablet, 1 of 1 cycle, Start date: 04/15/2018, End date: 05/26/2018 palonosetron  (ALOXI ) injection 0.25 mg, 0.25 mg, Intravenous,  Once, 1 of 1 cycle Administration: 0.25 mg (04/17/2018), 0.25 mg (04/24/2018), 0.25 mg  (05/01/2018) cyclophosphamide  (CYTOXAN ) 540 mg in sodium chloride  0.9 % 250 mL chemo infusion, 300 mg/m2 = 540 mg, Intravenous,  Once, 1 of 1 cycle Administration: 540 mg (04/17/2018), 540 mg (04/24/2018), 540 mg (05/01/2018) carfilzomib  (KYPROLIS ) 36 mg in dextrose  5 % 50 mL chemo infusion, 20 mg/m2 = 36 mg, Intravenous, Once, 5 of 5 cycles Administration: 36 mg (04/17/2018), 36 mg (04/18/2018), 60 mg (04/24/2018), 60 mg (04/25/2018), 60 mg (05/01/2018), 60 mg (05/02/2018), 60 mg (05/15/2018), 60 mg (05/16/2018), 60 mg (06/12/2018), 60 mg (06/13/2018), 60 mg (06/26/2018), 60 mg (06/27/2018), 60 mg (07/03/2018), 60 mg (07/04/2018), 60 mg (07/24/2018), 60 mg (07/25/2018), 60 mg (07/31/2018), 60 mg (08/01/2018), 60 mg (08/08/2018), 60 mg (08/07/2018), 60 mg (08/21/2018), 60 mg (08/22/2018), 60 mg (08/28/2018), 60 mg (08/29/2018), 60 mg (09/04/2018), 60 mg (09/05/2018)  for chemotherapy treatment.    06/11/2022 -  Chemotherapy   Patient is on Treatment Plan : MYELOMA RELAPSED / REFRACTORY Daratumumab  SQ + Bortezomib  + Dexamethasone  (DaraVd) q21d / Daratumumab  SQ q28d         INTERVAL HISTORY:   Makhayla is a 78 y.o. female  presenting to clinic today for follow up of multiple myeloma. She was last seen by me on 02/26/2024.  Since her last visit, she underwent MRI T spine on 03/04/2024 that found: Severe degenerative disc disease at L1-L2 with Modic type 1 changes in the vertebral endplates. Old compression fracture of T11. No acute fracture.  Today, she states that she is doing well overall. Her appetite level is at 100%. Her energy level is at 50%.  PAST MEDICAL HISTORY:   Past Medical History: Past Medical History:  Diagnosis Date   Anxiety    Chronic kidney disease    Depression    Hypertension    Multiple myeloma (HCC)    multiple myeloma   Pre-diabetes     Surgical History: Past Surgical History:  Procedure Laterality Date   ABDOMINAL HYSTERECTOMY     total   APPENDECTOMY     BALLOON DILATION N/A 05/28/2022    Procedure: BALLOON DILATION;  Surgeon: Cindie Carlin POUR, DO;  [facility];  Service: Endoscopy;  Laterality: N/A;   BIOPSY  05/28/2022   Procedure: BIOPSY;  Surgeon: Cindie Carlin POUR, DO;  [facility]: [facility];  Service: Endoscopy;;   COLONOSCOPY WITH PROPOFOL  N/A 12/25/2021   Procedure: COLONOSCOPY WITH PROPOFOL ;  Surgeon: Cindie Carlin POUR, DO;  [facility]: [facility];  Service: Endoscopy;  Laterality: N/A;  2:30pm   ESOPHAGOGASTRODUODENOSCOPY (EGD) WITH PROPOFOL  N/A 05/28/2022   Procedure: ESOPHAGOGASTRODUODENOSCOPY (EGD) WITH PROPOFOL ;  Surgeon: Cindie Carlin POUR, DO;  [facility]: [facility];  Service: Endoscopy;  Laterality: N/A;  10:00am, asa 3   LAPAROSCOPIC APPENDECTOMY N/A 05/20/2018   Procedure: APPENDECTOMY LAPAROSCOPIC;  Surgeon: Mavis Anes, MD;  Location: [facility];  Service: General;  Laterality: N/A;   POLYPECTOMY  12/25/2021   Procedure: POLYPECTOMY;  Surgeon: Cindie Carlin POUR, DO;  [facility]: [facility];  Service: Endoscopy;;   PORTACATH PLACEMENT Right 04/14/2018   Procedure: INSERTION PORT-A-CATH;  Surgeon: Mavis Anes, MD;  Location: [facility];  Service: General;  Laterality: Right;    Social History: Social History   Socioeconomic History   Marital status: Divorced    Spouse name: Not on file   Number of children: 6   Years of education: Not on file   Highest education level: Not on file  Occupational History    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  Tobacco Use   Smoking status: Never   Smokeless tobacco: Never  Vaping Use   Vaping status: Never Used  Substance and Sexual Activity   Alcohol use: Never   Drug use: Not Currently   Sexual activity: Not Currently  Other Topics Concern   Not on file  Social History Narrative   Not on file   Social Drivers of Health   Financial Resource Strain: Low Risk  (07/06/2020)   Overall Financial Resource Strain (CARDIA)    Difficulty of Paying Living Expenses: Not hard at all  Food Insecurity: No Food  Insecurity (09/17/2022)   Hunger Vital Sign    Worried About Running Out of Food in the Last Year: Never true    Ran Out of Food in the Last Year: Never true  Transportation Needs: No Transportation Needs (09/17/2022)   PRAPARE - Administrator, Civil Service (Medical): No    Lack of Transportation (Non-Medical): No  Physical Activity: Inactive (07/06/2020)   Exercise Vital Sign    Days of Exercise per Week: 0 days    Minutes of Exercise per Session: 0 min  Stress: No Stress Concern  Present (07/06/2020)   [employer] of Occupational Health - Occupational Stress Questionnaire    Feeling of Stress : Not at all  Social Connections: Moderately Isolated (07/06/2020)   Social Connection and Isolation Panel    Frequency of Communication with Friends and Family: More than three times a week    Frequency of Social Gatherings with Friends and Family: Twice a week    Attends Religious Services: 1 to 4 times per year    Active Member of [employer] or Organizations: No    Attends Banker Meetings: Never    Marital Status: Separated  Intimate Partner Violence: Not At Risk (09/17/2022)   Humiliation, Afraid, Rape, and Kick questionnaire    Fear of Current or Ex-Partner: No    Emotionally Abused: No    Physically Abused: No    Sexually Abused: No    Family History: Family History  Problem Relation Age of Onset   Heart disease Mother    Emphysema Father    Diabetes Sister    Depression Sister    Cancer Brother        liver, lung, and colon. colon cancer at age 58.   Diabetes Brother     Current Medications:  Current Outpatient Medications:    acetaminophen  (TYLENOL ) 325 MG tablet, Take 650 mg by mouth as needed for moderate pain (prior to infusion)., Disp: , Rfl:    acyclovir  (ZOVIRAX ) 400 MG tablet, Take 1 tablet (400 mg total) by mouth 2 (two) times daily., Disp: 60 tablet, Rfl: 6   ALPRAZolam  (XANAX ) 0.25 MG tablet, Take one tablet (0.25 mg) by mouth in the  morning as needed, and two tablets (0.5 mg) at bedtime as needed, Disp: 90 tablet, Rfl: 2   Calcium  Carb-Cholecalciferol  (CALCIUM  1000 + D PO), Take 1,000 mg by mouth daily., Disp: , Rfl:    ciclopirox (PENLAC) 8 % solution, Apply topically daily., Disp: , Rfl:    clotrimazole-betamethasone (LOTRISONE) cream, APPLY CREAM EXTERNALY TWICE A DAY FOR 30 DAYS; Duration: 30, Disp: , Rfl:    dexAMETHasone  20 MG TABS, Take 1 tablet by mouth as directed. Take 1 tablet 30 minutes prior to infusions (Patient taking differently: Take 1 tablet by mouth as directed. Take 1 tablet 30 minutes prior to the Darzelex injections-once a month), Disp: 30 tablet, Rfl: 2   diclofenac  Sodium (VOLTAREN ) 1 % GEL, 4 gram every 8 hours as needed., Disp: 100 g, Rfl: 11   diphenhydrAMINE  (BENADRYL ) 25 MG tablet, Take 50 mg by mouth as needed (prior to infusion)., Disp: , Rfl:    ELIQUIS  2.5 MG TABS tablet, Take 2.5 mg by mouth 2 (two) times daily., Disp: , Rfl:    escitalopram  (LEXAPRO ) 20 MG tablet, Take 1 tablet by mouth once daily, Disp: 30 tablet, Rfl: 0   furosemide  (LASIX ) 20 MG tablet, Take 1 tablet (20 mg total) by mouth as needed (in am as needed for swelling)., Disp: 30 tablet, Rfl: 2   gabapentin  (NEURONTIN ) 300 MG capsule, Take 1 capsule (300 mg total) by mouth 3 (three) times daily., Disp: 90 capsule, Rfl: 0   glipiZIDE  (GLUCOTROL ) 5 MG tablet, Take 1 tablet (5 mg total) by mouth daily before breakfast., Disp: 90 tablet, Rfl: 0   HYDROcodone -acetaminophen  (NORCO) 10-325 MG tablet, Take 1 tablet by mouth every 6 (six) hours as needed., Disp: 120 tablet, Rfl: 0   ketoconazole (NIZORAL) 2 % cream, Apply topically 2 (two) times daily., Disp: , Rfl:    lidocaine -prilocaine  (EMLA ) cream, Apply 1  Application topically as needed. 1 squeeze every 8 hours, Disp: 30 g, Rfl: 11   magnesium  oxide (MAG-OX) 400 (240 Mg) MG tablet, Take 1 tablet by mouth 2 (two) times daily., Disp: , Rfl:    midodrine  (PROAMATINE ) 2.5 MG tablet, Take  1 tablet (2.5 mg total) by mouth 3 (three) times daily with meals., Disp: 90 tablet, Rfl: 1   Multiple Vitamin (MULTI VITAMIN) TABS, Take 1 tablet by mouth daily., Disp: , Rfl:    pantoprazole  (PROTONIX ) 40 MG tablet, Take 1 tablet by mouth once daily, Disp: 90 tablet, Rfl: 1   polyethylene glycol (MIRALAX  / GLYCOLAX ) 17 g packet, Take 17 g by mouth daily as needed for moderate constipation., Disp: 14 each, Rfl: 0   prochlorperazine  (COMPAZINE ) 10 MG tablet, Take 1 tablet (10 mg total) by mouth every 6 (six) hours as needed for nausea or vomiting., Disp: 30 tablet, Rfl: 0   rosuvastatin  (CRESTOR ) 10 MG tablet, Take 10 mg by mouth daily., Disp: , Rfl:    Semaglutide (OZEMPIC, 0.25 OR 0.5 MG/DOSE, [place]), Inject 0.25 mg into the skin once a week., Disp: , Rfl:    zolpidem  (AMBIEN ) 10 MG tablet, Take 1 tablet (10 mg total) by mouth at bedtime as needed., Disp: 30 tablet, Rfl: 5   Allergies: Allergies  Allergen Reactions   Ciprofloxacin  Anaphylaxis   Amoxicillin  Other (See Comments)    unknown   Morphine And Codeine Nausea And Vomiting   Augmentin [Amoxicillin -Pot Clavulanate] Other (See Comments)    Headache, insomnia    REVIEW OF SYSTEMS:   Review of Systems  Constitutional:  Negative for chills, fatigue and fever.  HENT:   Negative for lump/mass, mouth sores, nosebleeds, sore throat and trouble swallowing.   Eyes:  Negative for eye problems.  Respiratory:  Positive for shortness of breath. Negative for cough.   Cardiovascular:  Negative for chest pain, leg swelling and palpitations.  Gastrointestinal:  Positive for diarrhea and nausea. Negative for abdominal pain, constipation and vomiting.  Genitourinary:  Negative for bladder incontinence, difficulty urinating, dysuria, frequency, hematuria and nocturia.   Musculoskeletal:  Positive for back pain. Negative for arthralgias, flank pain, myalgias and neck pain.  Skin:  Negative for itching and rash.  Neurological:  Positive for headaches.  Negative for dizziness and numbness.  Hematological:  Does not bruise/bleed easily.  Psychiatric/Behavioral:  Positive for sleep disturbance. Negative for depression and suicidal ideas. The patient is not nervous/anxious.   All other systems reviewed and are negative.    VITALS:   There were no vitals taken for this visit.  Wt Readings from Last 3 Encounters:  03/11/24 188 lb 4.4 oz (85.4 kg)  02/26/24 185 lb 13.6 oz (84.3 kg)  02/12/24 186 lb 4.6 oz (84.5 kg)    There is no height or weight on file to calculate BMI.  Performance status (ECOG): 1 - Symptomatic but completely ambulatory  PHYSICAL EXAM:   Physical Exam Vitals and nursing note reviewed. Exam conducted with a chaperone present.  Constitutional:      Appearance: Normal appearance.  Cardiovascular:     Rate and Rhythm: Normal rate and regular rhythm.     Pulses: Normal pulses.     Heart sounds: Normal heart sounds.  Pulmonary:     Effort: Pulmonary effort is normal.     Breath sounds: Normal breath sounds.  Abdominal:     Palpations: Abdomen is soft. There is no hepatomegaly, splenomegaly or mass.     Tenderness: There is no abdominal  tenderness.  Musculoskeletal:     Right lower leg: No edema.     Left lower leg: No edema.  Lymphadenopathy:     Cervical: No cervical adenopathy.     Right cervical: No superficial, deep or posterior cervical adenopathy.    Left cervical: No superficial, deep or posterior cervical adenopathy.     Upper Body:     Right upper body: No supraclavicular or axillary adenopathy.     Left upper body: No supraclavicular or axillary adenopathy.  Neurological:     General: No focal deficit present.     Mental Status: She is alert and oriented to person, place, and time.  Psychiatric:        Mood and Affect: Mood normal.        Behavior: Behavior normal.     LABS:   CBC     Component Value Date/Time   WBC 3.8 (L) 03/11/2024 0800   RBC 3.09 (L) 03/11/2024 0800   HGB 9.5 (L)  03/11/2024 0800   HGB 9.4 (L) 12/15/2018 1244   HCT 30.8 (L) 03/11/2024 0800   PLT 142 (L) 03/11/2024 0800   MCV 99.7 03/11/2024 0800   MCH 30.7 03/11/2024 0800   MCHC 30.8 03/11/2024 0800   RDW 13.2 03/11/2024 0800   LYMPHSABS 0.6 (L) 03/11/2024 0800   MONOABS 0.4 03/11/2024 0800   EOSABS 0.2 03/11/2024 0800   BASOSABS 0.0 03/11/2024 0800    CMP      Component Value Date/Time   NA 139 03/11/2024 0800   K 4.6 03/11/2024 0800   CL 101 03/11/2024 0800   CO2 27 03/11/2024 0800   GLUCOSE 114 (H) 03/11/2024 0800   BUN 24 (H) 03/11/2024 0800   CREATININE 1.59 (H) 03/11/2024 0800   CALCIUM  8.6 (L) 03/11/2024 0800   PROT 6.2 (L) 03/11/2024 0800   ALBUMIN  3.5 03/11/2024 0800   AST 21 03/11/2024 0800   ALT 24 03/11/2024 0800   ALKPHOS 77 03/11/2024 0800   BILITOT 0.5 03/11/2024 0800   GFRNONAA 34 (L) 03/11/2024 0800   GFRAA 30 (L) 05/02/2020 0939     No results found for: CEA1, CEA / No results found for: CEA1, CEA No results found for: PSA1 No results found for: CAN199 No results found for: CAN125  Lab Results  Component Value Date   TOTALPROTELP 5.6 (L) 02/12/2024   ALBUMINELP 3.5 02/12/2024   A1GS 0.2 02/12/2024   A2GS 0.9 02/12/2024   BETS 0.8 02/12/2024   GAMS 0.3 (L) 02/12/2024   MSPIKE 0.1 (H) 02/12/2024   SPEI Comment 02/12/2024   Lab Results  Component Value Date   TIBC 290 09/17/2022   TIBC 278 07/30/2022   TIBC 272 05/24/2022   FERRITIN 1,059 (H) 09/17/2022   FERRITIN 313 (H) 07/30/2022   FERRITIN 435 (H) 05/24/2022   IRONPCTSAT 10 (L) 09/17/2022   IRONPCTSAT 21 07/30/2022   IRONPCTSAT 27 05/24/2022   Lab Results  Component Value Date   LDH 129 05/24/2022   LDH 135 03/20/2022   LDH 129 12/14/2021     STUDIES:   MR Thoracic Spine W Wo Contrast Result Date: 03/04/2024 CLINICAL DATA:  Mid back pain EXAM: MRI THORACIC WITHOUT AND WITH CONTRAST TECHNIQUE: Multiplanar and multiecho pulse sequences of the thoracic spine were obtained  without and with intravenous contrast. CONTRAST:  10mL GADAVIST  GADOBUTROL  1 MMOL/ML IV SOLN COMPARISON:  None Available. FINDINGS: Alignment: Accentuated kyphosis Bone marrow signal: There are Modic type 1 changes in the vertebral endplates at L1-L2  noted. There is an old compression fracture of T11. No acute fracture. Thoracic spinal cord: Normal Facet joints: No significant abnormality Intervertebral discs: There is a broad-based disc bulge at L1-L2 without significant spinal stenosis. No thoracic disc herniation. Paraspinal tissues: No significant abnormality IMPRESSION: 1. Severe degenerative disc disease at L1-L2 with Modic type 1 changes in the vertebral endplates 2. Old compression fracture of T11 3. No acute fracture Electronically Signed   By: Nancyann Burns M.D.   On: 03/04/2024 11:49

## 2024-03-11 ENCOUNTER — Encounter: Payer: Self-pay | Admitting: Hematology

## 2024-03-11 ENCOUNTER — Inpatient Hospital Stay

## 2024-03-11 ENCOUNTER — Inpatient Hospital Stay (HOSPITAL_BASED_OUTPATIENT_CLINIC_OR_DEPARTMENT_OTHER): Admitting: Hematology

## 2024-03-11 VITALS — BP 127/85 | HR 79 | Temp 96.7°F | Resp 20 | Wt 188.3 lb

## 2024-03-11 DIAGNOSIS — Z5112 Encounter for antineoplastic immunotherapy: Secondary | ICD-10-CM | POA: Diagnosis not present

## 2024-03-11 DIAGNOSIS — C9000 Multiple myeloma not having achieved remission: Secondary | ICD-10-CM

## 2024-03-11 DIAGNOSIS — Z95828 Presence of other vascular implants and grafts: Secondary | ICD-10-CM

## 2024-03-11 LAB — CBC WITH DIFFERENTIAL/PLATELET
Abs Immature Granulocytes: 0.01 K/uL (ref 0.00–0.07)
Basophils Absolute: 0 K/uL (ref 0.0–0.1)
Basophils Relative: 1 %
Eosinophils Absolute: 0.2 K/uL (ref 0.0–0.5)
Eosinophils Relative: 5 %
HCT: 30.8 % — ABNORMAL LOW (ref 36.0–46.0)
Hemoglobin: 9.5 g/dL — ABNORMAL LOW (ref 12.0–15.0)
Immature Granulocytes: 0 %
Lymphocytes Relative: 16 %
Lymphs Abs: 0.6 K/uL — ABNORMAL LOW (ref 0.7–4.0)
MCH: 30.7 pg (ref 26.0–34.0)
MCHC: 30.8 g/dL (ref 30.0–36.0)
MCV: 99.7 fL (ref 80.0–100.0)
Monocytes Absolute: 0.4 K/uL (ref 0.1–1.0)
Monocytes Relative: 10 %
Neutro Abs: 2.6 K/uL (ref 1.7–7.7)
Neutrophils Relative %: 68 %
Platelets: 142 K/uL — ABNORMAL LOW (ref 150–400)
RBC: 3.09 MIL/uL — ABNORMAL LOW (ref 3.87–5.11)
RDW: 13.2 % (ref 11.5–15.5)
WBC: 3.8 K/uL — ABNORMAL LOW (ref 4.0–10.5)
nRBC: 0 % (ref 0.0–0.2)

## 2024-03-11 LAB — COMPREHENSIVE METABOLIC PANEL WITH GFR
ALT: 24 U/L (ref 0–44)
AST: 21 U/L (ref 15–41)
Albumin: 3.5 g/dL (ref 3.5–5.0)
Alkaline Phosphatase: 77 U/L (ref 38–126)
Anion gap: 11 (ref 5–15)
BUN: 24 mg/dL — ABNORMAL HIGH (ref 8–23)
CO2: 27 mmol/L (ref 22–32)
Calcium: 8.6 mg/dL — ABNORMAL LOW (ref 8.9–10.3)
Chloride: 101 mmol/L (ref 98–111)
Creatinine, Ser: 1.59 mg/dL — ABNORMAL HIGH (ref 0.44–1.00)
GFR, Estimated: 34 mL/min — ABNORMAL LOW (ref >60)
Glucose, Bld: 114 mg/dL — ABNORMAL HIGH (ref 70–99)
Potassium: 4.6 mmol/L (ref 3.5–5.1)
Sodium: 139 mmol/L (ref 135–145)
Total Bilirubin: 0.5 mg/dL (ref 0.0–1.2)
Total Protein: 6.2 g/dL — ABNORMAL LOW (ref 6.5–8.1)

## 2024-03-11 LAB — MAGNESIUM: Magnesium: 2.1 mg/dL (ref 1.7–2.4)

## 2024-03-11 MED ORDER — HEPARIN SOD (PORK) LOCK FLUSH 100 UNIT/ML IV SOLN
500.0000 [IU] | Freq: Once | INTRAVENOUS | Status: AC
  Administered 2024-03-11: 500 [IU] via INTRAVENOUS

## 2024-03-11 MED ORDER — BORTEZOMIB CHEMO SQ INJECTION 3.5 MG (2.5MG/ML)
1.0000 mg/m2 | Freq: Once | INTRAMUSCULAR | Status: AC
  Administered 2024-03-11: 2 mg via SUBCUTANEOUS
  Filled 2024-03-11: qty 0.8

## 2024-03-11 MED ORDER — SODIUM CHLORIDE FLUSH 0.9 % IV SOLN
10.0000 mL | Freq: Once | INTRAVENOUS | Status: AC
  Administered 2024-03-11: 10 mL via INTRAVENOUS
  Filled 2024-03-11: qty 10

## 2024-03-11 NOTE — Progress Notes (Signed)
 Patient presents today for Velcade  injection. Patient is in satisfactory condition with no new complaints voiced.  Vital signs are stable.  Labs reviewed by Dr. Rogers during the office visit and all labs are within treatment parameters.  We will proceed with treatment per MD orders.   Patient tolerated injection with no complaints voiced.  Site clean and dry with no bruising or swelling noted.  No complaints of pain.  Discharged with vital signs stable and no signs or symptoms of distress noted.

## 2024-03-11 NOTE — Patient Instructions (Signed)
 [facility] [place] - A DEPT OF [facility]  Discharge Instructions: Thank you for choosing [facility] to provide your oncology and hematology care.  If you have a lab appointment with the Cancer Center - please note that after April 8th, 2024, all labs will be drawn in the cancer center.  You do not have to check in or register with the main entrance as you have in the past but will complete your check-in in the cancer center.  Wear comfortable clothing and clothing appropriate for easy access to any Portacath or PICC line.   We strive to give you quality time with your provider. You may need to reschedule your appointment if you arrive late (15 or more minutes).  Arriving late affects you and other patients whose appointments are after yours.  Also, if you miss three or more appointments without notifying the office, you may be dismissed from the clinic at the provider's discretion.      For prescription refill requests, have your pharmacy contact our office and allow 72 hours for refills to be completed.    Today you received the following chemotherapy and/or immunotherapy agents Velcade.  Bortezomib Injection What is this medication? BORTEZOMIB (bor TEZ oh mib) treats lymphoma. It may also be used to treat multiple myeloma, a type of bone marrow cancer. It works by blocking a protein that causes cancer cells to grow and multiply. This helps to slow or stop the spread of cancer cells. This medicine may be used for other purposes; ask your health care provider or pharmacist if you have questions. COMMON BRAND NAME(S): BORUZU, Velcade What should I tell my care team before I take this medication? They need to know if you have any of these conditions: Dehydration Diabetes Heart disease Liver disease Tingling of the fingers or toes or other nerve disorder An unusual or allergic reaction to bortezomib, other medications, foods, dyes, or preservatives If  you or your partner are pregnant or trying to get pregnant Breastfeeding How should I use this medication? This medication is injected into a vein or under the skin. It is given by your care team in a hospital or clinic setting. Talk to your care team about the use of this medication in children. Special care may be needed. Overdosage: If you think you have taken too much of this medicine contact a poison control center or emergency room at once. NOTE: This medicine is only for you. Do not share this medicine with others. What if I miss a dose? Keep appointments for follow-up doses. It is important not to miss your dose. Call your care team if you are unable to keep an appointment. What may interact with this medication? Ketoconazole Rifampin This list may not describe all possible interactions. Give your health care provider a list of all the medicines, herbs, non-prescription drugs, or dietary supplements you use. Also tell them if you smoke, drink alcohol, or use illegal drugs. Some items may interact with your medicine. What should I watch for while using this medication? Your condition will be monitored carefully while you are receiving this medication. You may need blood work while taking this medication. This medication may affect your coordination, reaction time, or judgment. Do not drive or operate machinery until you know how this medication affects you. Sit up or stand slowly to reduce the risk of dizzy or fainting spells. Drinking alcohol with this medication can increase the risk of these side effects.  This medication may increase your risk of getting an infection. Call your care team for advice if you get a fever, chills, sore throat, or other symptoms of a cold or flu. Do not treat yourself. Try to avoid being around people who are sick. Check with your care team if you have severe diarrhea, nausea, and vomiting, or if you sweat a lot. The loss of too much body fluid may make it  dangerous for you to take this medication. Talk to your care team if you may be pregnant. Serious birth defects can occur if you take this medication during pregnancy and for 7 months after the last dose. You will need a negative pregnancy test before starting this medication. Contraception is recommended while taking this medication and for 7 months after the last dose. Your care team can help you find the option that works for you. If your partner can get pregnant, use a condom during sex while taking this medication and for 4 months after the last dose. Do not breastfeed while taking this medication and for 2 months after the last dose. This medication may cause infertility. Talk to your care team if you are concerned about your fertility. What side effects may I notice from receiving this medication? Side effects that you should report to your care team as soon as possible: Allergic reactions--skin rash, itching, hives, swelling of the face, lips, tongue, or throat Bleeding--bloody or black, tar-like stools, vomiting blood or [place] material that looks like coffee grounds, red or dark [place] urine, small red or purple spots on skin, unusual bruising or bleeding Bleeding in the brain--severe headache, stiff neck, confusion, dizziness, change in vision, numbness or weakness of the face, arm, or leg, trouble speaking, trouble walking, vomiting Bowel blockage--stomach cramping, unable to have a bowel movement or pass gas, loss of appetite, vomiting Heart failure--shortness of breath, swelling of the ankles, feet, or hands, sudden weight gain, unusual weakness or fatigue Infection--fever, chills, cough, sore throat, wounds that don't heal, pain or trouble when passing urine, general feeling of discomfort or being unwell Liver injury--right upper belly pain, loss of appetite, nausea, light-colored stool, dark yellow or [place] urine, yellowing skin or eyes, unusual weakness or fatigue Low blood  pressure--dizziness, feeling faint or lightheaded, blurry vision Lung injury--shortness of breath or trouble breathing, cough, spitting up blood, chest pain, fever Pain, tingling, or numbness in the hands or feet Severe or prolonged diarrhea Stomach pain, bloody diarrhea, pale skin, unusual weakness or fatigue, decrease in the amount of urine, which may be signs of hemolytic uremic syndrome Sudden and severe headache, confusion, change in vision, seizures, which may be signs of posterior reversible encephalopathy syndrome (PRES) TTP--purple spots on the skin or inside the mouth, pale skin, yellowing skin or eyes, unusual weakness or fatigue, fever, fast or irregular heartbeat, confusion, change in vision, trouble speaking, trouble walking Tumor lysis syndrome (TLS)--nausea, vomiting, diarrhea, decrease in the amount of urine, dark urine, unusual weakness or fatigue, confusion, muscle pain or cramps, fast or irregular heartbeat, joint pain Side effects that usually do not require medical attention (report to your care team if they continue or are bothersome): Constipation Diarrhea Fatigue Loss of appetite Nausea This list may not describe all possible side effects. Call your doctor for medical advice about side effects. You may report side effects to FDA at 1-800-FDA-1088. Where should I keep my medication? This medication is given in a hospital or clinic. It will not be stored at home. NOTE: This sheet  is a summary. It may not cover all possible information. If you have questions about this medicine, talk to your doctor, pharmacist, or health care provider.  2024 Elsevier/Gold Standard (2022-01-09 00:00:00)       To help prevent nausea and vomiting after your treatment, we encourage you to take your nausea medication as directed.  BELOW ARE SYMPTOMS THAT SHOULD BE REPORTED IMMEDIATELY: *FEVER GREATER THAN 100.4 F (38 C) OR HIGHER *CHILLS OR SWEATING *NAUSEA AND VOMITING THAT IS NOT  CONTROLLED WITH YOUR NAUSEA MEDICATION *UNUSUAL SHORTNESS OF BREATH *UNUSUAL BRUISING OR BLEEDING *URINARY PROBLEMS (pain or burning when urinating, or frequent urination) *BOWEL PROBLEMS (unusual diarrhea, constipation, pain near the anus) TENDERNESS IN MOUTH AND THROAT WITH OR WITHOUT PRESENCE OF ULCERS (sore throat, sores in mouth, or a toothache) UNUSUAL RASH, SWELLING OR PAIN  UNUSUAL VAGINAL DISCHARGE OR ITCHING   Items with * indicate a potential emergency and should be followed up as soon as possible or go to the Emergency Department if any problems should occur.  Please show the CHEMOTHERAPY ALERT CARD or IMMUNOTHERAPY ALERT CARD at check-in to the Emergency Department and triage nurse.  Should you have questions after your visit or need to cancel or reschedule your appointment, please contact [facility]  - A DEPT OF [facility] [phone]  and follow the prompts.  Office hours are 8:00 a.m. to 4:30 p.m. Monday - Friday. Please note that voicemails left after 4:00 p.m. may not be returned until the following business day.  We are closed weekends and major holidays. You have access to a nurse at all times for urgent questions. Please call the main number to the clinic [phone] and follow the prompts.  For any non-urgent questions, you may also contact your provider using MyChart. We now offer e-Visits for anyone 29 and older to request care online for non-urgent symptoms. For details visit [email or web address].   Also download the MyChart app! Go to the app store, search "MyChart", open the app, select [place], and log in with your MyChart username and password.

## 2024-03-11 NOTE — Progress Notes (Signed)
 Patient has been examined by Dr. Ellin Saba. Vital signs and labs have been reviewed by MD - ANC, Creatinine, LFTs, hemoglobin, and platelets are within treatment parameters per M.D. - pt may proceed with treatment.  Primary RN and pharmacy notified.

## 2024-03-11 NOTE — Patient Instructions (Signed)
 [facility] Discharge Instructions   You were seen and examined today by Dr. Rogers.  He reviewed the results of your lab work which are normal/stable.   He reviewed the results of your MRI which did not show any evidence of myeloma in the bones.   We will proceed with your treatment today.   Return as scheduled.    Thank you for choosing  [facility] at [facility] to provide your oncology and hematology care.  To afford each patient quality time with our provider, please arrive at least 15 minutes before your scheduled appointment time.   If you have a lab appointment with the Cancer Center please come in thru the Main Entrance and check in at the main information desk.  You need to re-schedule your appointment should you arrive 10 or more minutes late.  We strive to give you quality time with our providers, and arriving late affects you and other patients whose appointments are after yours.  Also, if you no show three or more times for appointments you may be dismissed from the clinic at the providers discretion.     Again, thank you for choosing [facility].  Our hope is that these requests will decrease the amount of time that you wait before being seen by our physicians.       _____________________________________________________________  Should you have questions after your visit to [facility], please contact our office at [phone] and follow the prompts.  Our office hours are 8:00 a.m. and 4:30 p.m. Monday - Friday.  Please note that voicemails left after 4:00 p.m. may not be returned until the following business day.  We are closed weekends and major holidays.  You do have access to a nurse 24-7, just call the main number to the clinic [phone] and do not press any options, hold on the line and a nurse will answer the phone.    For prescription refill requests, have your pharmacy  contact our office and allow 72 hours.    Due to Covid, you will need to wear a mask upon entering the hospital. If you do not have a mask, a mask will be given to you at the Main Entrance upon arrival. For doctor visits, patients may have 1 support person age 76 or older with them. For treatment visits, patients can not have anyone with them due to social distancing guidelines and our immunocompromised population.

## 2024-03-12 ENCOUNTER — Other Ambulatory Visit: Payer: Self-pay

## 2024-03-22 ENCOUNTER — Other Ambulatory Visit: Payer: Self-pay | Admitting: Hematology

## 2024-03-23 ENCOUNTER — Other Ambulatory Visit: Payer: Self-pay | Admitting: *Deleted

## 2024-03-23 ENCOUNTER — Encounter: Payer: Self-pay | Admitting: Hematology

## 2024-03-23 MED ORDER — MAGNESIUM OXIDE -MG SUPPLEMENT 400 (240 MG) MG PO TABS: 1.0000 | ORAL_TABLET | Freq: Two times a day (BID) | ORAL | 3 refills | Status: DC

## 2024-03-23 MED ORDER — FUROSEMIDE 20 MG PO TABS: 20.0000 mg | ORAL_TABLET | ORAL | 2 refills | Status: DC | PRN

## 2024-03-25 ENCOUNTER — Inpatient Hospital Stay (HOSPITAL_BASED_OUTPATIENT_CLINIC_OR_DEPARTMENT_OTHER): Admitting: Hematology

## 2024-03-25 ENCOUNTER — Encounter: Payer: Self-pay | Admitting: Hematology

## 2024-03-25 ENCOUNTER — Inpatient Hospital Stay

## 2024-03-25 ENCOUNTER — Inpatient Hospital Stay: Attending: Hematology

## 2024-03-25 DIAGNOSIS — F419 Anxiety disorder, unspecified: Secondary | ICD-10-CM | POA: Diagnosis not present

## 2024-03-25 DIAGNOSIS — C9000 Multiple myeloma not having achieved remission: Secondary | ICD-10-CM | POA: Diagnosis not present

## 2024-03-25 DIAGNOSIS — C9002 Multiple myeloma in relapse: Secondary | ICD-10-CM | POA: Insufficient documentation

## 2024-03-25 DIAGNOSIS — G629 Polyneuropathy, unspecified: Secondary | ICD-10-CM | POA: Insufficient documentation

## 2024-03-25 DIAGNOSIS — Z5112 Encounter for antineoplastic immunotherapy: Secondary | ICD-10-CM | POA: Diagnosis present

## 2024-03-25 LAB — COMPREHENSIVE METABOLIC PANEL WITH GFR
ALT: 29 U/L (ref 0–44)
AST: 26 U/L (ref 15–41)
Albumin: 4 g/dL (ref 3.5–5.0)
Alkaline Phosphatase: 83 U/L (ref 38–126)
Anion gap: 11 (ref 5–15)
BUN: 26 mg/dL — ABNORMAL HIGH (ref 8–23)
CO2: 25 mmol/L (ref 22–32)
Calcium: 9.1 mg/dL (ref 8.9–10.3)
Chloride: 102 mmol/L (ref 98–111)
Creatinine, Ser: 1.71 mg/dL — ABNORMAL HIGH (ref 0.44–1.00)
GFR, Estimated: 31 mL/min — ABNORMAL LOW (ref >60)
Glucose, Bld: 147 mg/dL — ABNORMAL HIGH (ref 70–99)
Potassium: 4.4 mmol/L (ref 3.5–5.1)
Sodium: 138 mmol/L (ref 135–145)
Total Bilirubin: 0.7 mg/dL (ref 0.0–1.2)
Total Protein: 6.7 g/dL (ref 6.5–8.1)

## 2024-03-25 LAB — MAGNESIUM: Magnesium: 2.2 mg/dL (ref 1.7–2.4)

## 2024-03-25 LAB — CBC WITH DIFFERENTIAL/PLATELET
Abs Immature Granulocytes: 0.01 K/uL (ref 0.00–0.07)
Basophils Absolute: 0 K/uL (ref 0.0–0.1)
Basophils Relative: 1 %
Eosinophils Absolute: 0.2 K/uL (ref 0.0–0.5)
Eosinophils Relative: 5 %
HCT: 32.5 % — ABNORMAL LOW (ref 36.0–46.0)
Hemoglobin: 10 g/dL — ABNORMAL LOW (ref 12.0–15.0)
Immature Granulocytes: 0 %
Lymphocytes Relative: 14 %
Lymphs Abs: 0.5 K/uL — ABNORMAL LOW (ref 0.7–4.0)
MCH: 30.7 pg (ref 26.0–34.0)
MCHC: 30.8 g/dL (ref 30.0–36.0)
MCV: 99.7 fL (ref 80.0–100.0)
Monocytes Absolute: 0.4 K/uL (ref 0.1–1.0)
Monocytes Relative: 10 %
Neutro Abs: 2.7 K/uL (ref 1.7–7.7)
Neutrophils Relative %: 70 %
Platelets: 158 K/uL (ref 150–400)
RBC: 3.26 MIL/uL — ABNORMAL LOW (ref 3.87–5.11)
RDW: 13.6 % (ref 11.5–15.5)
WBC: 3.8 K/uL — ABNORMAL LOW (ref 4.0–10.5)
nRBC: 0 % (ref 0.0–0.2)

## 2024-03-25 MED ORDER — BORTEZOMIB CHEMO SQ INJECTION 3.5 MG (2.5MG/ML)
1.0000 mg/m2 | Freq: Once | INTRAMUSCULAR | Status: AC
  Administered 2024-03-25: 2 mg via SUBCUTANEOUS
  Filled 2024-03-25: qty 0.8

## 2024-03-25 MED ORDER — SODIUM CHLORIDE 0.9% FLUSH
10.0000 mL | Freq: Once | INTRAVENOUS | Status: AC
  Administered 2024-03-25: 10 mL via INTRAVENOUS

## 2024-03-25 MED ORDER — DARATUMUMAB-HYALURONIDASE-FIHJ 1800-30000 MG-UT/15ML ~~LOC~~ SOLN
1800.0000 mg | Freq: Once | SUBCUTANEOUS | Status: AC
  Administered 2024-03-25: 1800 mg via SUBCUTANEOUS
  Filled 2024-03-25: qty 15

## 2024-03-25 MED ORDER — DEXAMETHASONE 4 MG PO TABS: 20.0000 mg | ORAL_TABLET | Freq: Once | ORAL | Status: DC

## 2024-03-25 MED ORDER — ACETAMINOPHEN 325 MG PO TABS: 650.0000 mg | ORAL_TABLET | Freq: Once | ORAL | Status: DC

## 2024-03-25 MED ORDER — DIPHENHYDRAMINE HCL 25 MG PO CAPS: 50.0000 mg | ORAL_CAPSULE | Freq: Once | ORAL | Status: DC

## 2024-03-25 NOTE — Patient Instructions (Addendum)
 [facility]  Discharge Instructions  You were seen and examined today by Dr. Rogers.  Dr. Rogers discussed your most recent lab work which revealed that everything looks good and stable.  Continue getting the Velcade  every 2 weeks and the Dara every 4 weeks.   Stop the magnesium .  Follow-up as scheduled.    Thank you for choosing [facility] to provide your oncology and hematology care.   To afford each patient quality time with our provider, please arrive at least 15 minutes before your scheduled appointment time. You may need to reschedule your appointment if you arrive late (10 or more minutes). Arriving late affects you and other patients whose appointments are after yours.  Also, if you miss three or more appointments without notifying the office, you may be dismissed from the clinic at the provider's discretion.    Again, thank you for choosing [facility].  Our hope is that these requests will decrease the amount of time that you wait before being seen by our physicians.   If you have a lab appointment with the Cancer Center - please note that after April 8th, all labs will be drawn in the cancer center.  You do not have to check in or register with the main entrance as you have in the past but will complete your check-in at the cancer center.            _____________________________________________________________  Should you have questions after your visit to [facility], please contact our office at [phone] and follow the prompts.  Our office hours are 8:00 a.m. to 4:30 p.m. Monday - Thursday and 8:00 a.m. to 2:30 p.m. Friday.  Please note that voicemails left after 4:00 p.m. may not be returned until the following business day.  We are closed weekends and all major holidays.  You do have access to a nurse 24-7, just call the main number to the clinic [phone] and do not press any  options, hold on the line and a nurse will answer the phone.    For prescription refill requests, have your pharmacy contact our office and allow 72 hours.    Masks are no longer required in the cancer centers. If you would like for your care team to wear a mask while they are taking care of you, please let them know. You may have one support person who is at least 76 years old accompany you for your appointments.

## 2024-03-25 NOTE — Progress Notes (Signed)
Patient tolerated Velcade injection with no complaints voiced. Lab work reviewed. See MAR for details. Injection site clean and dry with no bruising or swelling noted. Patient stable during and after injection. Band aid applied.  Patient tolerated Daratumumab injection with no complaints voiced. See MAR for details. Lab reviewed. Injection site clean and dry with no bruising or swelling noted at site. Band aid applied. Vss with discharge and left in satisfactory condition with nos/s of distress noted.

## 2024-03-25 NOTE — Progress Notes (Signed)
 Alexandra Price [address], [place] [address]    Clinic Day:  03/25/2024  Referring physician: Katrinka Aquas, MD  Patient Care Team: Alexandra Aquas, MD as PCP - General (Internal Medicine) Alexandra Hai, MD as Medical Oncologist (Medical Oncology) Alexandra Carlin POUR, DO as Consulting Physician (Gastroenterology)   ASSESSMENT & PLAN:   Assessment: 1.  IgG lambda plasma cell myeloma, stage II, standard risk: -4 cycles of KPD from 04/17/2018 through 08/21/2018, stem cell transplant on 10/23/2018. -PET scan on 01/21/2019 showed multiple bone lesions but without any hypermetabolic activity. -BMBX on 01/21/2019 with normocellular marrow with no increase in plasma cells.  Normal FISH.  MRD results negative. -Maintenance pomalidomide  2 mg 3 weeks on/1 week off started on 03/10/2019. -Bone marrow biopsy on 10/29/2019 shows trilineage hematopoiesis with no evidence of plasma cells.  Chromosome analysis and FISH are normal. -BM BX on 10/27/2020 at [facility]-normocellular marrow with 30 to 40%, TLH.  2% of the total cells are CD 138+ plasma cells. - Pomalyst  was held due to dizziness from 03/29/2021 through 04/26/2021. - MRI of the brain on 03/31/2021 did not show any evidence of intracranial lesions.  Multiple bone lesions in the skull compatible with sequela of myeloma.  - Labs at [facility] on 11/16/2021: M spike 0.24 g.  Immunofixation positive for IgG lambda. - PET scan on 12/14/2021: No evidence of FDG avid osseous or soft tissue myeloma. - She has developed left shoulder blade and right posterior rib pain for the last 3 to 4 months. - 24-hour urine on 12/14/2021: Total protein 136 mg.  Immune immunofixation positive for lambda type Bence-Jones protein. -- PET scan (05/24/2022): Single new hypermetabolic lytic lesion involving left lamina of T11.  No canal encroachment.  No other hypermetabolic bone lesions.  Stable diffuse lytic lesions throughout the axial and appendicular skeleton.  No  soft tissue lesions. - Maintenance Pomalyst  discontinued on 05/29/2022 - XRT to the T11 completed on 06/20/2022 - Daratumumab , Velcade  and dexamethasone  started on 06/11/2022   2.  Pulmonary embolism: -CT angiogram on 04/21/2020 showed filling defect at [facility]. -VQ scan confirmed pulmonary embolism. -She is on Eliquis .   3.  Right shoulder and upper arm pain: - She received XRT to the chest from 12/26/2020 through 01/06/2021.   4.  Myeloma bone disease: - Denosumab  held due to exposure of bone in the right lower jaw.  Last dose in 2020.    Plan: 1.  IgG lambda plasma cell myeloma: - She is continuing to tolerate Velcade  and monthly Darzalex  very well.  Denies any infections. - Last myeloma labs on 02/12/2024: M spike 0.1 g, down from 0.2 g.  Lambda light chains increased to 48 from 39.  Ratio is 0.11, changed from 0.15 previously. - Recent MRI did not show any active myeloma.  Hence have recommended continuing Velcade  every 2 weeks and Darzalex  once a month.  She also takes dexamethasone  20 mg on the days of Darzalex . - Recommend follow-up in 8 weeks with repeat myeloma panel 2 weeks prior.  If there is any progression, consider evaluation for CAR-T/bispecifics with Alexandra Price.   2.  Back pain/left shoulder blade pain/right lateral and posterior rib pains: -MRI thoracic spine (02/28/2024): No myeloma bone disease.  Severe degenerative disc disease at L1-L2 and old compression fracture at T11. - She will continue hydrocodone  10/325 every 6 hours as needed.   3.  Hypomagnesemia: - She is taking magnesium  once daily.  Magnesium  is 2.2.  She  was told to stop taking magnesium .   4.  Anxiety: - Continue Xanax  0.5 mg twice daily as needed.   5.  Sleeping difficulty: - Continue Ambien  at bedtime which is helping.   6.  Peripheral neuropathy: - Neuropathy in the feet is well-controlled with gabapentin  300 mg 3 times daily.   7.  Pulmonary embolism: - Continue Eliquis  twice  daily.  No bleeding issues reported.    Orders Placed This Encounter  Procedures   Magnesium     Standing Status:   Future    Expected Date:   04/22/2024    Expiration Date:   04/22/2025   Comprehensive metabolic panel    Standing Status:   Future    Expected Date:   04/22/2024    Expiration Date:   04/22/2025   CBC with Differential    Standing Status:   Future    Expected Date:   04/22/2024    Expiration Date:   04/22/2025   Magnesium     Standing Status:   Future    Expected Date:   05/06/2024    Expiration Date:   05/06/2025   CBC with Differential    Standing Status:   Future    Expected Date:   05/06/2024    Expiration Date:   05/06/2025   Comprehensive metabolic panel    Standing Status:   Future    Expected Date:   05/06/2024    Expiration Date:   05/06/2025      Alexandra Price,acting as a scribe for Alexandra Stands, MD.,have documented all relevant documentation on the behalf of Alexandra Stands, MD,as directed by  Alexandra Stands, MD while in the presence of Alexandra Stands, MD.  I, Alexandra Stands MD, have reviewed the above documentation for accuracy and completeness, and I agree with the above.       Alexandra Stands, MD   8/6/20252:19 PM  CHIEF COMPLAINT:   Diagnosis: multiple myeloma    Cancer Staging  No matching staging information was found for the patient.    Prior Therapy: 1. KPD x 4 cycles from 04/17/2018 to 08/21/2018. 2. Stem cell transplant on 10/23/2018. 3. Radiation to left and right humerus 25 Gy in 10 fractions from 08/23/2020 to 08/25/2020. 4.  Maintenance Pomalyst  until 06/08/2022  Current Therapy:  Darzalex , Velcade  and dexamethasone  started on 06/11/2022    HISTORY OF PRESENT ILLNESS:   Oncology History  Multiple myeloma not having achieved remission (HCC)  04/07/2018 Initial Diagnosis   Multiple myeloma not having achieved remission (HCC)   06/11/2022 -  Chemotherapy   Patient is on Treatment Plan : MYELOMA RELAPSED  / REFRACTORY Daratumumab  SQ + Bortezomib  + Dexamethasone  (DaraVd) q21d / Daratumumab  SQ q28d      Multiple myeloma without remission (HCC)  04/15/2018 Initial Diagnosis   Multiple myeloma without remission (HCC)   04/17/2018 - 09/05/2018 Chemotherapy   The patient had dexamethasone  (DECADRON ) 4 MG tablet, 1 of 1 cycle, Start date: 04/15/2018, End date: 05/26/2018 palonosetron  (ALOXI ) injection 0.25 mg, 0.25 mg, Intravenous,  Once, 1 of 1 cycle Administration: 0.25 mg (04/17/2018), 0.25 mg (04/24/2018), 0.25 mg (05/01/2018) cyclophosphamide  (CYTOXAN ) 540 mg in sodium chloride  0.9 % 250 mL chemo infusion, 300 mg/m2 = 540 mg, Intravenous,  Once, 1 of 1 cycle Administration: 540 mg (04/17/2018), 540 mg (04/24/2018), 540 mg (05/01/2018) carfilzomib  (KYPROLIS ) 36 mg in dextrose  5 % 50 mL chemo infusion, 20 mg/m2 = 36 mg, Intravenous, Once, 5 of 5 cycles Administration: 36 mg (04/17/2018), 36 mg (04/18/2018), 60 mg (  04/24/2018), 60 mg (04/25/2018), 60 mg (05/01/2018), 60 mg (05/02/2018), 60 mg (05/15/2018), 60 mg (05/16/2018), 60 mg (06/12/2018), 60 mg (06/13/2018), 60 mg (06/26/2018), 60 mg (06/27/2018), 60 mg (07/03/2018), 60 mg (07/04/2018), 60 mg (07/24/2018), 60 mg (07/25/2018), 60 mg (07/31/2018), 60 mg (08/01/2018), 60 mg (08/08/2018), 60 mg (08/07/2018), 60 mg (08/21/2018), 60 mg (08/22/2018), 60 mg (08/28/2018), 60 mg (08/29/2018), 60 mg (09/04/2018), 60 mg (09/05/2018)  for chemotherapy treatment.    06/11/2022 -  Chemotherapy   Patient is on Treatment Plan : MYELOMA RELAPSED / REFRACTORY Daratumumab  SQ + Bortezomib  + Dexamethasone  (DaraVd) q21d / Daratumumab  SQ q28d         INTERVAL HISTORY:   Alexandra Price is a 78 y.o. female presenting to clinic today for follow up of multiple myeloma. She was last seen by me on 03/11/2024.  Today, she states that she is doing well overall. Her appetite level is at 100%. Her energy level is at 40%.  PAST MEDICAL HISTORY:   Past Medical History: Past Medical History:  Diagnosis Date    Anxiety    Chronic kidney disease    Depression    Hypertension    Multiple myeloma (HCC)    multiple myeloma   Pre-diabetes     Surgical History: Past Surgical History:  Procedure Laterality Date   ABDOMINAL HYSTERECTOMY     total   APPENDECTOMY     BALLOON DILATION N/A 05/28/2022   Procedure: BALLOON DILATION;  Surgeon: Alexandra Carlin POUR, DO;  [facility];  Service: Endoscopy;  Laterality: N/A;   BIOPSY  05/28/2022   Procedure: BIOPSY;  Surgeon: Alexandra Carlin POUR, DO;  [facility];  Service: Endoscopy;;   COLONOSCOPY WITH PROPOFOL  N/A 12/25/2021   Procedure: COLONOSCOPY WITH PROPOFOL ;  Surgeon: Alexandra Carlin POUR, DO;  [facility];  Service: Endoscopy;  Laterality: N/A;  2:30pm   ESOPHAGOGASTRODUODENOSCOPY (EGD) WITH PROPOFOL  N/A 05/28/2022   Procedure: ESOPHAGOGASTRODUODENOSCOPY (EGD) WITH PROPOFOL ;  Surgeon: Alexandra Carlin POUR, DO;  [facility];  Service: Endoscopy;  Laterality: N/A;  10:00am, asa 3   LAPAROSCOPIC APPENDECTOMY N/A 05/20/2018   Procedure: APPENDECTOMY LAPAROSCOPIC;  Surgeon: Mavis Anes, MD;  Location: [facility];  Service: General;  Laterality: N/A;   POLYPECTOMY  12/25/2021   Procedure: POLYPECTOMY;  Surgeon: Alexandra Carlin POUR, DO;  [facility];  Service: Endoscopy;;   PORTACATH PLACEMENT Right 04/14/2018   Procedure: INSERTION PORT-A-CATH;  Surgeon: Mavis Anes, MD;  Location: [facility];  Service: General;  Laterality: Right;    Social History: Social History   Socioeconomic History   Marital status: Divorced    Spouse name: Not on file   Number of children: 6   Years of education: Not on file   Highest education level: Not on file  Occupational History    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  Tobacco Use   Smoking status: Never   Smokeless tobacco: Never  Vaping Use   Vaping status: Never Used  Substance and Sexual Activity   Alcohol use: Never   Drug use: Not Currently   Sexual activity: Not Currently   Other Topics Concern   Not on file  Social History Narrative   Not on file   Social Drivers of Health   Financial Resource Strain: Low Risk  (07/06/2020)   Overall Financial Resource Strain (CARDIA)    Difficulty of Paying Living Expenses: Not hard at all  Food Insecurity: No Food Insecurity (09/17/2022)   Hunger Vital Sign    Worried About  Running Out of Food in the Last Year: Never true    Ran Out of Food in the Last Year: Never true  Transportation Needs: No Transportation Needs (09/17/2022)   PRAPARE - Administrator, Civil Service (Medical): No    Lack of Transportation (Non-Medical): No  Physical Activity: Inactive (07/06/2020)   Exercise Vital Sign    Days of Exercise per Week: 0 days    Minutes of Exercise per Session: 0 min  Stress: No Stress Concern Present (07/06/2020)   [employer] of Occupational Health - Occupational Stress Questionnaire    Feeling of Stress : Not at all  Social Connections: Moderately Isolated (07/06/2020)   Social Connection and Isolation Panel    Frequency of Communication with Friends and Family: More than three times a week    Frequency of Social Gatherings with Friends and Family: Twice a week    Attends Religious Services: 1 to 4 times per year    Active Member of [employer] or Organizations: No    Attends Banker Meetings: Never    Marital Status: Separated  Intimate Partner Violence: Not At Risk (09/17/2022)   Humiliation, Afraid, Rape, and Kick questionnaire    Fear of Current or Ex-Partner: No    Emotionally Abused: No    Physically Abused: No    Sexually Abused: No    Family History: Family History  Problem Relation Age of Onset   Heart disease Mother    Emphysema Father    Diabetes Sister    Depression Sister    Cancer Brother        liver, lung, and colon. colon cancer at age 76.   Diabetes Brother     Current Medications:  Current Outpatient Medications:    acetaminophen  (TYLENOL ) 325 MG  tablet, Take 650 mg by mouth as needed for moderate pain (prior to infusion)., Disp: , Rfl:    acyclovir  (ZOVIRAX ) 400 MG tablet, Take 1 tablet (400 mg total) by mouth 2 (two) times daily., Disp: 60 tablet, Rfl: 6   ALPRAZolam  (XANAX ) 0.25 MG tablet, Take one tablet (0.25 mg) by mouth in the morning as needed, and two tablets (0.5 mg) at bedtime as needed, Disp: 90 tablet, Rfl: 2   Calcium  Carb-Cholecalciferol  (CALCIUM  1000 + D PO), Take 1,000 mg by mouth daily., Disp: , Rfl:    ciclopirox (PENLAC) 8 % solution, Apply topically daily., Disp: , Rfl:    clotrimazole-betamethasone (LOTRISONE) cream, APPLY CREAM EXTERNALY TWICE A DAY FOR 30 DAYS; Duration: 30, Disp: , Rfl:    dexAMETHasone  20 MG TABS, Take 1 tablet by mouth as directed. Take 1 tablet 30 minutes prior to infusions (Patient taking differently: Take 1 tablet by mouth as directed. Take 1 tablet 30 minutes prior to the Darzelex injections-once a month), Disp: 30 tablet, Rfl: 2   diclofenac  Sodium (VOLTAREN ) 1 % GEL, 4 gram every 8 hours as needed., Disp: 100 g, Rfl: 11   diphenhydrAMINE  (BENADRYL ) 25 MG tablet, Take 50 mg by mouth as needed (prior to infusion)., Disp: , Rfl:    ELIQUIS  2.5 MG TABS tablet, Take 2.5 mg by mouth 2 (two) times daily., Disp: , Rfl:    escitalopram  (LEXAPRO ) 20 MG tablet, Take 1 tablet by mouth once daily, Disp: 30 tablet, Rfl: 0   furosemide  (LASIX ) 20 MG tablet, Take 1 tablet (20 mg total) by mouth as needed (in am as needed for swelling)., Disp: 30 tablet, Rfl: 2   gabapentin  (NEURONTIN ) 300 MG capsule, Take  1 capsule (300 mg total) by mouth 3 (three) times daily., Disp: 90 capsule, Rfl: 0   glipiZIDE  (GLUCOTROL ) 5 MG tablet, Take 1 tablet (5 mg total) by mouth daily before breakfast., Disp: 90 tablet, Rfl: 0   HYDROcodone -acetaminophen  (NORCO) 10-325 MG tablet, Take 1 tablet by mouth every 6 (six) hours as needed., Disp: 120 tablet, Rfl: 0   ketoconazole (NIZORAL) 2 % cream, Apply topically 2 (two) times daily.,  Disp: , Rfl:    lidocaine -prilocaine  (EMLA ) cream, Apply 1 Application topically as needed. 1 squeeze every 8 hours, Disp: 30 g, Rfl: 11   magnesium  oxide (MAG-OX) 400 (240 Mg) MG tablet, Take 1 tablet (400 mg total) by mouth 2 (two) times daily., Disp: 60 tablet, Rfl: 3   midodrine  (PROAMATINE ) 2.5 MG tablet, Take 1 tablet (2.5 mg total) by mouth 3 (three) times daily with meals., Disp: 90 tablet, Rfl: 1   Multiple Vitamin (MULTI VITAMIN) TABS, Take 1 tablet by mouth daily., Disp: , Rfl:    pantoprazole  (PROTONIX ) 40 MG tablet, Take 1 tablet by mouth once daily, Disp: 90 tablet, Rfl: 1   polyethylene glycol (MIRALAX  / GLYCOLAX ) 17 g packet, Take 17 g by mouth daily as needed for moderate constipation., Disp: 14 each, Rfl: 0   prochlorperazine  (COMPAZINE ) 10 MG tablet, Take 1 tablet (10 mg total) by mouth every 6 (six) hours as needed for nausea or vomiting., Disp: 30 tablet, Rfl: 0   rosuvastatin  (CRESTOR ) 10 MG tablet, Take 10 mg by mouth daily., Disp: , Rfl:    Semaglutide (OZEMPIC, 0.25 OR 0.5 MG/DOSE, [place]), Inject 0.25 mg into the skin once a week., Disp: , Rfl:    zolpidem  (AMBIEN ) 10 MG tablet, Take 1 tablet (10 mg total) by mouth at bedtime as needed., Disp: 30 tablet, Rfl: 5 No current facility-administered medications for this visit.  Facility-Administered Medications Ordered in Other Visits:    acetaminophen  (TYLENOL ) tablet 650 mg, 650 mg, Oral, Once, Alexandra Hai, MD   dexamethasone  (DECADRON ) tablet 20 mg, 20 mg, Oral, Once, Yalena Colon, MD   diphenhydrAMINE  (BENADRYL ) capsule 50 mg, 50 mg, Oral, Once, Alexandra Hai, MD   Allergies: Allergies  Allergen Reactions   Ciprofloxacin  Anaphylaxis   Amoxicillin  Other (See Comments)    unknown   Morphine And Codeine Nausea And Vomiting   Augmentin [Amoxicillin -Pot Clavulanate] Other (See Comments)    Headache, insomnia    REVIEW OF SYSTEMS:   Review of Systems  Constitutional:  Negative for chills, fatigue  and fever.  HENT:   Negative for lump/mass, mouth sores, nosebleeds, sore throat and trouble swallowing.   Eyes:  Negative for eye problems.  Respiratory:  Negative for cough and shortness of breath.   Cardiovascular:  Negative for chest pain, leg swelling and palpitations.  Gastrointestinal:  Positive for diarrhea. Negative for abdominal pain, constipation, nausea and vomiting.  Genitourinary:  Negative for bladder incontinence, difficulty urinating, dysuria, frequency, hematuria and nocturia.   Musculoskeletal:  Negative for arthralgias, back pain, flank pain, myalgias and neck pain.  Skin:  Negative for itching and rash.  Neurological:  Negative for dizziness, headaches and numbness.  Hematological:  Does not bruise/bleed easily.  Psychiatric/Behavioral:  Positive for depression. Negative for sleep disturbance and suicidal ideas. The patient is nervous/anxious.   All other systems reviewed and are negative.    VITALS:   There were no vitals taken for this visit.  Wt Readings from Last 3 Encounters:  03/25/24 188 lb (85.3 kg)  03/11/24 188 lb 4.4 oz (  85.4 kg)  02/26/24 185 lb 13.6 oz (84.3 kg)    There is no height or weight on file to calculate BMI.  Performance status (ECOG): 1 - Symptomatic but completely ambulatory  PHYSICAL EXAM:   Physical Exam Vitals and nursing note reviewed. Exam conducted with a chaperone present.  Constitutional:      Appearance: Normal appearance.  Cardiovascular:     Rate and Rhythm: Normal rate and regular rhythm.     Pulses: Normal pulses.     Heart sounds: Normal heart sounds.  Pulmonary:     Effort: Pulmonary effort is normal.     Breath sounds: Normal breath sounds.  Abdominal:     Palpations: Abdomen is soft. There is no hepatomegaly, splenomegaly or mass.     Tenderness: There is no abdominal tenderness.  Musculoskeletal:     Right lower leg: No edema.     Left lower leg: No edema.  Lymphadenopathy:     Cervical: No cervical  adenopathy.     Right cervical: No superficial, deep or posterior cervical adenopathy.    Left cervical: No superficial, deep or posterior cervical adenopathy.     Upper Body:     Right upper body: No supraclavicular or axillary adenopathy.     Left upper body: No supraclavicular or axillary adenopathy.  Neurological:     General: No focal deficit present.     Mental Status: She is alert and oriented to person, place, and time.  Psychiatric:        Mood and Affect: Mood normal.        Behavior: Behavior normal.     LABS:   CBC     Component Value Date/Time   WBC 3.8 (L) 03/25/2024 1054   RBC 3.26 (L) 03/25/2024 1054   HGB 10.0 (L) 03/25/2024 1054   HGB 9.4 (L) 12/15/2018 1244   HCT 32.5 (L) 03/25/2024 1054   PLT 158 03/25/2024 1054   MCV 99.7 03/25/2024 1054   MCH 30.7 03/25/2024 1054   MCHC 30.8 03/25/2024 1054   RDW 13.6 03/25/2024 1054   LYMPHSABS 0.5 (L) 03/25/2024 1054   MONOABS 0.4 03/25/2024 1054   EOSABS 0.2 03/25/2024 1054   BASOSABS 0.0 03/25/2024 1054    CMP      Component Value Date/Time   NA 138 03/25/2024 1054   K 4.4 03/25/2024 1054   CL 102 03/25/2024 1054   CO2 25 03/25/2024 1054   GLUCOSE 147 (H) 03/25/2024 1054   BUN 26 (H) 03/25/2024 1054   CREATININE 1.71 (H) 03/25/2024 1054   CALCIUM  9.1 03/25/2024 1054   PROT 6.7 03/25/2024 1054   ALBUMIN  4.0 03/25/2024 1054   AST 26 03/25/2024 1054   ALT 29 03/25/2024 1054   ALKPHOS 83 03/25/2024 1054   BILITOT 0.7 03/25/2024 1054   GFRNONAA 31 (L) 03/25/2024 1054   GFRAA 30 (L) 05/02/2020 0939     No results found for: CEA1, CEA / No results found for: CEA1, CEA No results found for: PSA1 No results found for: CAN199 No results found for: CAN125  Lab Results  Component Value Date   TOTALPROTELP 5.6 (L) 02/12/2024   ALBUMINELP 3.5 02/12/2024   A1GS 0.2 02/12/2024   A2GS 0.9 02/12/2024   BETS 0.8 02/12/2024   GAMS 0.3 (L) 02/12/2024   MSPIKE 0.1 (H) 02/12/2024   SPEI Comment  02/12/2024   Lab Results  Component Value Date   TIBC 290 09/17/2022   TIBC 278 07/30/2022   TIBC 272 05/24/2022  FERRITIN 1,059 (H) 09/17/2022   FERRITIN 313 (H) 07/30/2022   FERRITIN 435 (H) 05/24/2022   IRONPCTSAT 10 (L) 09/17/2022   IRONPCTSAT 21 07/30/2022   IRONPCTSAT 27 05/24/2022   Lab Results  Component Value Date   LDH 129 05/24/2022   LDH 135 03/20/2022   LDH 129 12/14/2021     STUDIES:   MR Thoracic Spine W Wo Contrast Result Date: 03/04/2024 CLINICAL DATA:  Mid back pain EXAM: MRI THORACIC WITHOUT AND WITH CONTRAST TECHNIQUE: Multiplanar and multiecho pulse sequences of the thoracic spine were obtained without and with intravenous contrast. CONTRAST:  10mL GADAVIST  GADOBUTROL  1 MMOL/ML IV SOLN COMPARISON:  None Available. FINDINGS: Alignment: Accentuated kyphosis Bone marrow signal: There are Modic type 1 changes in the vertebral endplates at L1-L2 noted. There is an old compression fracture of T11. No acute fracture. Thoracic spinal cord: Normal Facet joints: No significant abnormality Intervertebral discs: There is a broad-based disc bulge at L1-L2 without significant spinal stenosis. No thoracic disc herniation. Paraspinal tissues: No significant abnormality IMPRESSION: 1. Severe degenerative disc disease at L1-L2 with Modic type 1 changes in the vertebral endplates 2. Old compression fracture of T11 3. No acute fracture Electronically Signed   By: Nancyann Burns M.D.   On: 03/04/2024 11:49

## 2024-03-25 NOTE — Patient Instructions (Signed)
 [facility] [place] - A DEPT OF [place]  Discharge Instructions: Thank you for choosing [facility] to provide your oncology and hematology care.  If you have a lab appointment with the Cancer Center - please note that after April 8th, 2024, all labs will be drawn in the cancer center.  You do not have to check in or register with the main entrance as you have in the past but will complete your check-in in the cancer center.  Wear comfortable clothing and clothing appropriate for easy access to any Portacath or PICC line.   We strive to give you quality time with your provider. You may need to reschedule your appointment if you arrive late (15 or more minutes).  Arriving late affects you and other patients whose appointments are after yours.  Also, if you miss three or more appointments without notifying the office, you may be dismissed from the clinic at the provider's discretion.      For prescription refill requests, have your pharmacy contact our office and allow 72 hours for refills to be completed.    Today you received the following chemotherapy and/or immunotherapy agents Velcade  and Daratumumab , return as scheduled.   To help prevent nausea and vomiting after your treatment, we encourage you to take your nausea medication as directed.  BELOW ARE SYMPTOMS THAT SHOULD BE REPORTED IMMEDIATELY: *FEVER GREATER THAN 100.4 F (38 C) OR HIGHER *CHILLS OR SWEATING *NAUSEA AND VOMITING THAT IS NOT CONTROLLED WITH YOUR NAUSEA MEDICATION *UNUSUAL SHORTNESS OF BREATH *UNUSUAL BRUISING OR BLEEDING *URINARY PROBLEMS (pain or burning when urinating, or frequent urination) *BOWEL PROBLEMS (unusual diarrhea, constipation, pain near the anus) TENDERNESS IN MOUTH AND THROAT WITH OR WITHOUT PRESENCE OF ULCERS (sore throat, sores in mouth, or a toothache) UNUSUAL RASH, SWELLING OR PAIN  UNUSUAL VAGINAL DISCHARGE OR ITCHING   Items with * indicate a potential  emergency and should be followed up as soon as possible or go to the Emergency Department if any problems should occur.  Please show the CHEMOTHERAPY ALERT CARD or IMMUNOTHERAPY ALERT CARD at check-in to the Emergency Department and triage nurse.  Should you have questions after your visit or need to cancel or reschedule your appointment, please contact [facility] [place] - A DEPT OF [facility] [phone]  and follow the prompts.  Office hours are 8:00 a.m. to 4:30 p.m. Monday - Friday. Please note that voicemails left after 4:00 p.m. may not be returned until the following business day.  We are closed weekends and major holidays. You have access to a nurse at all times for urgent questions. Please call the main number to the clinic [phone] and follow the prompts.  For any non-urgent questions, you may also contact your provider using MyChart. We now offer e-Visits for anyone 66 and older to request care online for non-urgent symptoms. For details visit [email or web address].   Also download the MyChart app! Go to the app store, search MyChart, open the app, select [place], and log in with your MyChart username and password.

## 2024-03-25 NOTE — Progress Notes (Signed)
 Patient presents today for chemotherapy infusion. Patient is in satisfactory condition with no new complaints voiced.  Vital signs are stable.  Labs reviewed by Dr. Rogers during the office visit and all labs are within treatment parameters.  Tylenol , Benadryl , and Decadron  taken at 1145 prior to injection appointment.  We will proceed with treatment per MD orders.

## 2024-03-30 ENCOUNTER — Other Ambulatory Visit: Payer: Self-pay | Admitting: *Deleted

## 2024-03-30 MED ORDER — FUROSEMIDE 20 MG PO TABS: 20.0000 mg | ORAL_TABLET | ORAL | 2 refills | Status: DC | PRN

## 2024-04-02 ENCOUNTER — Other Ambulatory Visit: Payer: Self-pay | Admitting: *Deleted

## 2024-04-02 DIAGNOSIS — C9000 Multiple myeloma not having achieved remission: Secondary | ICD-10-CM

## 2024-04-02 MED ORDER — HYDROCODONE-ACETAMINOPHEN 10-325 MG PO TABS
1.0000 | ORAL_TABLET | Freq: Four times a day (QID) | ORAL | 0 refills | Status: DC | PRN
Start: 2024-04-02 — End: 2024-04-30

## 2024-04-07 ENCOUNTER — Encounter: Payer: Self-pay | Admitting: Oncology

## 2024-04-08 ENCOUNTER — Inpatient Hospital Stay

## 2024-04-08 DIAGNOSIS — C9000 Multiple myeloma not having achieved remission: Secondary | ICD-10-CM

## 2024-04-08 DIAGNOSIS — Z5112 Encounter for antineoplastic immunotherapy: Secondary | ICD-10-CM | POA: Diagnosis not present

## 2024-04-08 LAB — CBC WITH DIFFERENTIAL/PLATELET
Abs Immature Granulocytes: 0.02 K/uL (ref 0.00–0.07)
Basophils Absolute: 0 K/uL (ref 0.0–0.1)
Basophils Relative: 1 %
Eosinophils Absolute: 0.2 K/uL (ref 0.0–0.5)
Eosinophils Relative: 3 %
HCT: 30.4 % — ABNORMAL LOW (ref 36.0–46.0)
Hemoglobin: 9.5 g/dL — ABNORMAL LOW (ref 12.0–15.0)
Immature Granulocytes: 0 %
Lymphocytes Relative: 12 %
Lymphs Abs: 0.7 K/uL (ref 0.7–4.0)
MCH: 30.8 pg (ref 26.0–34.0)
MCHC: 31.3 g/dL (ref 30.0–36.0)
MCV: 98.7 fL (ref 80.0–100.0)
Monocytes Absolute: 0.4 K/uL (ref 0.1–1.0)
Monocytes Relative: 8 %
Neutro Abs: 4.2 K/uL (ref 1.7–7.7)
Neutrophils Relative %: 76 %
Platelets: 133 K/uL — ABNORMAL LOW (ref 150–400)
RBC: 3.08 MIL/uL — ABNORMAL LOW (ref 3.87–5.11)
RDW: 13.5 % (ref 11.5–15.5)
WBC: 5.5 K/uL (ref 4.0–10.5)
nRBC: 0 % (ref 0.0–0.2)

## 2024-04-08 LAB — COMPREHENSIVE METABOLIC PANEL WITH GFR
ALT: 28 U/L (ref 0–44)
AST: 25 U/L (ref 15–41)
Albumin: 3.7 g/dL (ref 3.5–5.0)
Alkaline Phosphatase: 83 U/L (ref 38–126)
Anion gap: 12 (ref 5–15)
BUN: 24 mg/dL — ABNORMAL HIGH (ref 8–23)
CO2: 25 mmol/L (ref 22–32)
Calcium: 8.9 mg/dL (ref 8.9–10.3)
Chloride: 101 mmol/L (ref 98–111)
Creatinine, Ser: 1.57 mg/dL — ABNORMAL HIGH (ref 0.44–1.00)
GFR, Estimated: 34 mL/min — ABNORMAL LOW (ref >60)
Glucose, Bld: 157 mg/dL — ABNORMAL HIGH (ref 70–99)
Potassium: 4.6 mmol/L (ref 3.5–5.1)
Sodium: 138 mmol/L (ref 135–145)
Total Bilirubin: 0.5 mg/dL (ref 0.0–1.2)
Total Protein: 6.4 g/dL — ABNORMAL LOW (ref 6.5–8.1)

## 2024-04-08 LAB — MAGNESIUM: Magnesium: 2 mg/dL (ref 1.7–2.4)

## 2024-04-08 MED ORDER — BORTEZOMIB CHEMO SQ INJECTION 3.5 MG (2.5MG/ML)
1.0000 mg/m2 | Freq: Once | INTRAMUSCULAR | Status: AC
  Administered 2024-04-08: 2 mg via SUBCUTANEOUS
  Filled 2024-04-08: qty 0.8

## 2024-04-08 NOTE — Progress Notes (Signed)
Treatment given per orders. Patient tolerated it well without problems. Vitals stable and discharged home from clinic ambulatory. Follow up as scheduled.  

## 2024-04-08 NOTE — Progress Notes (Signed)
 Patients port flushed without difficulty.  Good blood return noted with no bruising or swelling noted at site.  Band aid applied.  VSS with discharge and left in satisfactory condition with no s/s of distress noted.

## 2024-04-08 NOTE — Progress Notes (Signed)
 Patient presents today for chemotherapy Velcade  injection.Patient is in satisfactory condition with no new complaints voiced.  Vital signs are stable.  Labs reviewed and all labs are within treatment parameters.  We will proceed with treatment per MD orders.

## 2024-04-08 NOTE — Patient Instructions (Signed)
 [facility] [place] - A DEPT OF [place]  Discharge Instructions: Thank you for choosing [facility] to provide your oncology and hematology care.  If you have a lab appointment with the Cancer Center - please note that after April 8th, 2024, all labs will be drawn in the cancer center.  You do not have to check in or register with the main entrance as you have in the past but will complete your check-in in the cancer center.  Wear comfortable clothing and clothing appropriate for easy access to any Portacath or PICC line.   We strive to give you quality time with your provider. You may need to reschedule your appointment if you arrive late (15 or more minutes).  Arriving late affects you and other patients whose appointments are after yours.  Also, if you miss three or more appointments without notifying the office, you may be dismissed from the clinic at the provider's discretion.      For prescription refill requests, have your pharmacy contact our office and allow 72 hours for refills to be completed.    Today you received the following chemotherapy and/or immunotherapy agents bortezamib   To help prevent nausea and vomiting after your treatment, we encourage you to take your nausea medication as directed.  BELOW ARE SYMPTOMS THAT SHOULD BE REPORTED IMMEDIATELY: *FEVER GREATER THAN 100.4 F (38 C) OR HIGHER *CHILLS OR SWEATING *NAUSEA AND VOMITING THAT IS NOT CONTROLLED WITH YOUR NAUSEA MEDICATION *UNUSUAL SHORTNESS OF BREATH *UNUSUAL BRUISING OR BLEEDING *URINARY PROBLEMS (pain or burning when urinating, or frequent urination) *BOWEL PROBLEMS (unusual diarrhea, constipation, pain near the anus) TENDERNESS IN MOUTH AND THROAT WITH OR WITHOUT PRESENCE OF ULCERS (sore throat, sores in mouth, or a toothache) UNUSUAL RASH, SWELLING OR PAIN  UNUSUAL VAGINAL DISCHARGE OR ITCHING   Items with * indicate a potential emergency and should be followed up  as soon as possible or go to the Emergency Department if any problems should occur.  Please show the CHEMOTHERAPY ALERT CARD or IMMUNOTHERAPY ALERT CARD at check-in to the Emergency Department and triage nurse.  Should you have questions after your visit or need to cancel or reschedule your appointment, please contact [facility] [place] - A DEPT OF [facility] [phone]  and follow the prompts.  Office hours are 8:00 a.m. to 4:30 p.m. Monday - Friday. Please note that voicemails left after 4:00 p.m. may not be returned until the following business day.  We are closed weekends and major holidays. You have access to a nurse at all times for urgent questions. Please call the main number to the clinic [phone] and follow the prompts.  For any non-urgent questions, you may also contact your provider using MyChart. We now offer e-Visits for anyone 5 and older to request care online for non-urgent symptoms. For details visit [email or web address].   Also download the MyChart app! Go to the app store, search MyChart, open the app, select [place], and log in with your MyChart username and password.

## 2024-04-15 ENCOUNTER — Other Ambulatory Visit: Payer: Self-pay | Admitting: *Deleted

## 2024-04-15 MED ORDER — ALPRAZOLAM 0.25 MG PO TABS: ORAL_TABLET | ORAL | 2 refills | Status: DC

## 2024-04-21 ENCOUNTER — Other Ambulatory Visit: Payer: Self-pay | Admitting: *Deleted

## 2024-04-21 MED ORDER — MIDODRINE HCL 2.5 MG PO TABS: 2.5000 mg | ORAL_TABLET | Freq: Three times a day (TID) | ORAL | 1 refills | Status: DC

## 2024-04-22 ENCOUNTER — Inpatient Hospital Stay

## 2024-04-22 ENCOUNTER — Encounter: Payer: Self-pay | Admitting: Oncology

## 2024-04-22 ENCOUNTER — Inpatient Hospital Stay: Attending: Hematology

## 2024-04-22 DIAGNOSIS — Z5112 Encounter for antineoplastic immunotherapy: Secondary | ICD-10-CM | POA: Insufficient documentation

## 2024-04-22 DIAGNOSIS — F419 Anxiety disorder, unspecified: Secondary | ICD-10-CM | POA: Insufficient documentation

## 2024-04-22 DIAGNOSIS — C9000 Multiple myeloma not having achieved remission: Secondary | ICD-10-CM

## 2024-04-22 DIAGNOSIS — G8929 Other chronic pain: Secondary | ICD-10-CM | POA: Diagnosis not present

## 2024-04-22 DIAGNOSIS — Z86711 Personal history of pulmonary embolism: Secondary | ICD-10-CM | POA: Insufficient documentation

## 2024-04-22 DIAGNOSIS — Z7901 Long term (current) use of anticoagulants: Secondary | ICD-10-CM | POA: Insufficient documentation

## 2024-04-22 DIAGNOSIS — G629 Polyneuropathy, unspecified: Secondary | ICD-10-CM | POA: Diagnosis not present

## 2024-04-22 DIAGNOSIS — C9002 Multiple myeloma in relapse: Secondary | ICD-10-CM | POA: Insufficient documentation

## 2024-04-22 DIAGNOSIS — R251 Tremor, unspecified: Secondary | ICD-10-CM | POA: Insufficient documentation

## 2024-04-22 LAB — CBC WITH DIFFERENTIAL/PLATELET
Abs Immature Granulocytes: 0.01 K/uL (ref 0.00–0.07)
Basophils Absolute: 0 K/uL (ref 0.0–0.1)
Basophils Relative: 1 %
Eosinophils Absolute: 0.1 K/uL (ref 0.0–0.5)
Eosinophils Relative: 4 %
HCT: 31.3 % — ABNORMAL LOW (ref 36.0–46.0)
Hemoglobin: 9.5 g/dL — ABNORMAL LOW (ref 12.0–15.0)
Immature Granulocytes: 0 %
Lymphocytes Relative: 13 %
Lymphs Abs: 0.4 K/uL — ABNORMAL LOW (ref 0.7–4.0)
MCH: 29.7 pg (ref 26.0–34.0)
MCHC: 30.4 g/dL (ref 30.0–36.0)
MCV: 97.8 fL (ref 80.0–100.0)
Monocytes Absolute: 0.4 K/uL (ref 0.1–1.0)
Monocytes Relative: 11 %
Neutro Abs: 2.3 K/uL (ref 1.7–7.7)
Neutrophils Relative %: 71 %
Platelets: 132 K/uL — ABNORMAL LOW (ref 150–400)
RBC: 3.2 MIL/uL — ABNORMAL LOW (ref 3.87–5.11)
RDW: 13.6 % (ref 11.5–15.5)
WBC: 3.3 K/uL — ABNORMAL LOW (ref 4.0–10.5)
nRBC: 0 % (ref 0.0–0.2)

## 2024-04-22 LAB — COMPREHENSIVE METABOLIC PANEL WITH GFR
ALT: 25 U/L (ref 0–44)
AST: 27 U/L (ref 15–41)
Albumin: 3.8 g/dL (ref 3.5–5.0)
Alkaline Phosphatase: 85 U/L (ref 38–126)
Anion gap: 13 (ref 5–15)
BUN: 21 mg/dL (ref 8–23)
CO2: 25 mmol/L (ref 22–32)
Calcium: 8.6 mg/dL — ABNORMAL LOW (ref 8.9–10.3)
Chloride: 100 mmol/L (ref 98–111)
Creatinine, Ser: 1.62 mg/dL — ABNORMAL HIGH (ref 0.44–1.00)
GFR, Estimated: 33 mL/min — ABNORMAL LOW (ref >60)
Glucose, Bld: 145 mg/dL — ABNORMAL HIGH (ref 70–99)
Potassium: 3.8 mmol/L (ref 3.5–5.1)
Sodium: 138 mmol/L (ref 135–145)
Total Bilirubin: 0.6 mg/dL (ref 0.0–1.2)
Total Protein: 6.6 g/dL (ref 6.5–8.1)

## 2024-04-22 LAB — MAGNESIUM: Magnesium: 1.9 mg/dL (ref 1.7–2.4)

## 2024-04-22 MED ORDER — DARATUMUMAB-HYALURONIDASE-FIHJ 1800-30000 MG-UT/15ML ~~LOC~~ SOLN
1800.0000 mg | Freq: Once | SUBCUTANEOUS | Status: AC
  Administered 2024-04-22: 1800 mg via SUBCUTANEOUS
  Filled 2024-04-22: qty 15

## 2024-04-22 MED ORDER — BORTEZOMIB CHEMO SQ INJECTION 3.5 MG (2.5MG/ML)
1.0000 mg/m2 | Freq: Once | INTRAMUSCULAR | Status: AC
  Administered 2024-04-22: 2 mg via SUBCUTANEOUS
  Filled 2024-04-22: qty 0.8

## 2024-04-22 NOTE — Progress Notes (Signed)
 Patient took own pre meds from home.    Patient tolerated Daratumumab  and Velcade  injection with no complaints voiced.  See MAR for details.  Labs reviewed. Injection site clean and dry with no bruising or swelling noted at site.  Band aid applied.  Vss with discharge and left in satisfactory condition with no s/s of distress noted.

## 2024-04-22 NOTE — Patient Instructions (Signed)
 [facility] [place] - A DEPT OF [place]  Discharge Instructions: Thank you for choosing [facility] to provide your oncology and hematology care.  If you have a lab appointment with the Cancer Center - please note that after April 8th, 2024, all labs will be drawn in the cancer center.  You do not have to check in or register with the main entrance as you have in the past but will complete your check-in in the cancer center.  Wear comfortable clothing and clothing appropriate for easy access to any Portacath or PICC line.   We strive to give you quality time with your provider. You may need to reschedule your appointment if you arrive late (15 or more minutes).  Arriving late affects you and other patients whose appointments are after yours.  Also, if you miss three or more appointments without notifying the office, you may be dismissed from the clinic at the provider's discretion.      For prescription refill requests, have your pharmacy contact our office and allow 72 hours for refills to be completed.    Today you received the following chemotherapy and/or immunotherapy agents darzalex  and Velcade .       To help prevent nausea and vomiting after your treatment, we encourage you to take your nausea medication as directed.  BELOW ARE SYMPTOMS THAT SHOULD BE REPORTED IMMEDIATELY: *FEVER GREATER THAN 100.4 F (38 C) OR HIGHER *CHILLS OR SWEATING *NAUSEA AND VOMITING THAT IS NOT CONTROLLED WITH YOUR NAUSEA MEDICATION *UNUSUAL SHORTNESS OF BREATH *UNUSUAL BRUISING OR BLEEDING *URINARY PROBLEMS (pain or burning when urinating, or frequent urination) *BOWEL PROBLEMS (unusual diarrhea, constipation, pain near the anus) TENDERNESS IN MOUTH AND THROAT WITH OR WITHOUT PRESENCE OF ULCERS (sore throat, sores in mouth, or a toothache) UNUSUAL RASH, SWELLING OR PAIN  UNUSUAL VAGINAL DISCHARGE OR ITCHING   Items with * indicate a potential emergency and should  be followed up as soon as possible or go to the Emergency Department if any problems should occur.  Please show the CHEMOTHERAPY ALERT CARD or IMMUNOTHERAPY ALERT CARD at check-in to the Emergency Department and triage nurse.  Should you have questions after your visit or need to cancel or reschedule your appointment, please contact [facility] [place] - A DEPT [facility] [phone]  and follow the prompts.  Office hours are 8:00 a.m. to 4:30 p.m. Monday - Friday. Please note that voicemails left after 4:00 p.m. may not be returned until the following business day.  We are closed weekends and major holidays. You have access to a nurse at all times for urgent questions. Please call the main number to the clinic [phone] and follow the prompts.  For any non-urgent questions, you may also contact your provider using MyChart. We now offer e-Visits for anyone 68 and older to request care online for non-urgent symptoms. For details visit [email or web address].   Also download the MyChart app! Go to the app store, search MyChart, open the app, select [place], and log in with your MyChart username and password.

## 2024-04-23 ENCOUNTER — Other Ambulatory Visit: Payer: Self-pay

## 2024-04-23 DIAGNOSIS — F419 Anxiety disorder, unspecified: Secondary | ICD-10-CM

## 2024-04-23 DIAGNOSIS — G479 Sleep disorder, unspecified: Secondary | ICD-10-CM

## 2024-04-23 LAB — KAPPA/LAMBDA LIGHT CHAINS
Kappa free light chain: 5.9 mg/L (ref 3.3–19.4)
Kappa, lambda light chain ratio: 0.05 — ABNORMAL LOW (ref 0.26–1.65)
Lambda free light chains: 116.6 mg/L — ABNORMAL HIGH (ref 5.7–26.3)

## 2024-04-23 MED ORDER — ZOLPIDEM TARTRATE 10 MG PO TABS: 10.0000 mg | ORAL_TABLET | Freq: Every evening | ORAL | 5 refills | Status: DC | PRN

## 2024-04-24 LAB — PROTEIN ELECTROPHORESIS, SERUM
A/G Ratio: 1.3 (ref 0.7–1.7)
Albumin ELP: 3.5 g/dL (ref 2.9–4.4)
Alpha-1-Globulin: 0.3 g/dL (ref 0.0–0.4)
Alpha-2-Globulin: 0.9 g/dL (ref 0.4–1.0)
Beta Globulin: 1 g/dL (ref 0.7–1.3)
Gamma Globulin: 0.5 g/dL (ref 0.4–1.8)
Globulin, Total: 2.6 g/dL (ref 2.2–3.9)
M-Spike, %: 0.2 g/dL — ABNORMAL HIGH
Total Protein ELP: 6.1 g/dL (ref 6.0–8.5)

## 2024-04-27 ENCOUNTER — Other Ambulatory Visit: Payer: Self-pay | Admitting: *Deleted

## 2024-04-27 DIAGNOSIS — F419 Anxiety disorder, unspecified: Secondary | ICD-10-CM

## 2024-04-27 DIAGNOSIS — G479 Sleep disorder, unspecified: Secondary | ICD-10-CM

## 2024-04-27 MED ORDER — ZOLPIDEM TARTRATE 10 MG PO TABS: 10.0000 mg | ORAL_TABLET | Freq: Every evening | ORAL | 5 refills | Status: DC | PRN

## 2024-04-30 ENCOUNTER — Other Ambulatory Visit: Payer: Self-pay | Admitting: *Deleted

## 2024-04-30 DIAGNOSIS — C9000 Multiple myeloma not having achieved remission: Secondary | ICD-10-CM

## 2024-04-30 MED ORDER — HYDROCODONE-ACETAMINOPHEN 10-325 MG PO TABS
1.0000 | ORAL_TABLET | Freq: Four times a day (QID) | ORAL | 0 refills | Status: DC | PRN
Start: 2024-04-30 — End: 2024-05-01

## 2024-05-01 ENCOUNTER — Other Ambulatory Visit: Payer: Self-pay

## 2024-05-01 DIAGNOSIS — C9000 Multiple myeloma not having achieved remission: Secondary | ICD-10-CM

## 2024-05-01 MED ORDER — HYDROCODONE-ACETAMINOPHEN 10-325 MG PO TABS: 1.0000 | ORAL_TABLET | Freq: Four times a day (QID) | ORAL | 0 refills | Status: DC | PRN

## 2024-05-05 ENCOUNTER — Inpatient Hospital Stay

## 2024-05-05 ENCOUNTER — Inpatient Hospital Stay (HOSPITAL_BASED_OUTPATIENT_CLINIC_OR_DEPARTMENT_OTHER): Admitting: Oncology

## 2024-05-05 DIAGNOSIS — G622 Polyneuropathy due to other toxic agents: Secondary | ICD-10-CM

## 2024-05-05 DIAGNOSIS — C9000 Multiple myeloma not having achieved remission: Secondary | ICD-10-CM | POA: Diagnosis not present

## 2024-05-05 DIAGNOSIS — Z5112 Encounter for antineoplastic immunotherapy: Secondary | ICD-10-CM | POA: Diagnosis not present

## 2024-05-05 DIAGNOSIS — M549 Dorsalgia, unspecified: Secondary | ICD-10-CM | POA: Insufficient documentation

## 2024-05-05 DIAGNOSIS — M546 Pain in thoracic spine: Secondary | ICD-10-CM | POA: Diagnosis not present

## 2024-05-05 DIAGNOSIS — F419 Anxiety disorder, unspecified: Secondary | ICD-10-CM | POA: Diagnosis not present

## 2024-05-05 DIAGNOSIS — R251 Tremor, unspecified: Secondary | ICD-10-CM | POA: Insufficient documentation

## 2024-05-05 DIAGNOSIS — I2782 Chronic pulmonary embolism: Secondary | ICD-10-CM

## 2024-05-05 DIAGNOSIS — G8929 Other chronic pain: Secondary | ICD-10-CM

## 2024-05-05 LAB — MAGNESIUM: Magnesium: 2.2 mg/dL (ref 1.7–2.4)

## 2024-05-05 LAB — CBC WITH DIFFERENTIAL/PLATELET
Abs Immature Granulocytes: 0.01 K/uL (ref 0.00–0.07)
Basophils Absolute: 0 K/uL (ref 0.0–0.1)
Basophils Relative: 1 %
Eosinophils Absolute: 0.2 K/uL (ref 0.0–0.5)
Eosinophils Relative: 4 %
HCT: 32 % — ABNORMAL LOW (ref 36.0–46.0)
Hemoglobin: 9.8 g/dL — ABNORMAL LOW (ref 12.0–15.0)
Immature Granulocytes: 0 %
Lymphocytes Relative: 15 %
Lymphs Abs: 0.7 K/uL (ref 0.7–4.0)
MCH: 30.2 pg (ref 26.0–34.0)
MCHC: 30.6 g/dL (ref 30.0–36.0)
MCV: 98.5 fL (ref 80.0–100.0)
Monocytes Absolute: 0.4 K/uL (ref 0.1–1.0)
Monocytes Relative: 9 %
Neutro Abs: 3.2 K/uL (ref 1.7–7.7)
Neutrophils Relative %: 71 %
Platelets: 157 K/uL (ref 150–400)
RBC: 3.25 MIL/uL — ABNORMAL LOW (ref 3.87–5.11)
RDW: 13.6 % (ref 11.5–15.5)
WBC: 4.6 K/uL (ref 4.0–10.5)
nRBC: 0 % (ref 0.0–0.2)

## 2024-05-05 LAB — COMPREHENSIVE METABOLIC PANEL WITH GFR
ALT: 26 U/L (ref 0–44)
AST: 23 U/L (ref 15–41)
Albumin: 3.9 g/dL (ref 3.5–5.0)
Alkaline Phosphatase: 77 U/L (ref 38–126)
Anion gap: 12 (ref 5–15)
BUN: 27 mg/dL — ABNORMAL HIGH (ref 8–23)
CO2: 27 mmol/L (ref 22–32)
Calcium: 8.9 mg/dL (ref 8.9–10.3)
Chloride: 101 mmol/L (ref 98–111)
Creatinine, Ser: 1.7 mg/dL — ABNORMAL HIGH (ref 0.44–1.00)
GFR, Estimated: 31 mL/min — ABNORMAL LOW (ref >60)
Glucose, Bld: 90 mg/dL (ref 70–99)
Potassium: 4.4 mmol/L (ref 3.5–5.1)
Sodium: 140 mmol/L (ref 135–145)
Total Bilirubin: 0.6 mg/dL (ref 0.0–1.2)
Total Protein: 6.7 g/dL (ref 6.5–8.1)

## 2024-05-05 NOTE — Assessment & Plan Note (Addendum)
 Anxiety related to multiple myeloma diagnosis and also family issues  - Continue Ativan  as prescribed

## 2024-05-05 NOTE — Assessment & Plan Note (Addendum)
 Relapsed IgG lambda multiple myeloma.  Extensive oncology history as below. Patient was initially diagnosed in 2019 followed by induction chemotherapy with KCd, followed by bone marrow transplantation.  Patient was on maintenance pomalidomide  for several years prior to relapse and since then has been on daratumumab , Velcade  and dexamethasone   - Patient tolerating daratumumab  Velcade  and dexamethasone  well.  Patient does have some peripheral neuropathy that has been stable with the use of gabapentin . - We discussed labs from today.  There is slight worsening of M spike from 0.1 to 0.2.  There is also significant elevation in free lambda light chains.  This is concerning for progression of disease. - CAR-T was discussed as next line of treatment by Dr. Rogers.  Patient does have good functional status and I do believe that this will be a good line of treatment for the patient - Will refer to Dr. Arman at [facility] for evaluation of CAR-T - Continue acyclovir  as prescribed - Physical exam stable today.  Proceed with treatment tomorrow.  Return to clinic in 4 weeks with labs 1

## 2024-05-05 NOTE — Assessment & Plan Note (Addendum)
 History of PE  - Continue Eliquis  5 mg twice daily

## 2024-05-05 NOTE — Assessment & Plan Note (Addendum)
 Likely secondary to chemotherapy  - Continue gabapentin  300 mg 3 times a day

## 2024-05-05 NOTE — Assessment & Plan Note (Signed)
 Likely secondary to previous T11 compression fracture and also degenerative disc disease at L1 and L2  - Continue hydrocodone  05/22/2024 every 6 hours as needed

## 2024-05-05 NOTE — Progress Notes (Signed)
 Patients port flushed without difficulty.  Good blood return noted with no bruising or swelling noted at site.  Labs drawn per orders.  VSS with discharge and left in satisfactory condition with no s/s of distress noted. All follow ups as scheduled.       Alexandra Price

## 2024-05-05 NOTE — Patient Instructions (Signed)
 [facility] at [facility] Discharge Instructions   You were seen and examined today by Dr. Davonna.  She reviewed the results of your lab work which are normal/stable.   We will proceed with your treatment tomorrow.    Return as scheduled.    Thank you for choosing [facility] at [facility] to provide your oncology and hematology care.  To afford each patient quality time with our provider, please arrive at least 15 minutes before your scheduled appointment time.   If you have a lab appointment with the Cancer Center please come in thru the Main Entrance and check in at the main information desk.  You need to re-schedule your appointment should you arrive 10 or more minutes late.  We strive to give you quality time with our providers, and arriving late affects you and other patients whose appointments are after yours.  Also, if you no show three or more times for appointments you may be dismissed from the clinic at the providers discretion.     Again, thank you for choosing [facility].  Our hope is that these requests will decrease the amount of time that you wait before being seen by our physicians.       _____________________________________________________________  Should you have questions after your visit to [facility], please contact our office at [phone] and follow the prompts.  Our office hours are 8:00 a.m. and 4:30 p.m. Monday - Friday.  Please note that voicemails left after 4:00 p.m. may not be returned until the following business day.  We are closed weekends and major holidays.  You do have access to a nurse 24-7, just call the main number to the clinic [phone] and do not press any options, hold on the line and a nurse will answer the phone.    For prescription refill requests, have your pharmacy contact our office and allow 72 hours.    Due to Covid, you will need to wear a mask upon entering  the hospital. If you do not have a mask, a mask will be given to you at the Main Entrance upon arrival. For doctor visits, patients may have 1 support person age 76 or older with them. For treatment visits, patients can not have anyone with them due to social distancing guidelines and our immunocompromised population.

## 2024-05-05 NOTE — Progress Notes (Signed)
 Patient Care Team: Katrinka Aquas, MD as PCP - General (Internal Medicine) Cindie Carlin POUR, DO as Consulting Physician (Gastroenterology)  Clinic Day:  05/05/2024  Referring physician: Katrinka Aquas, MD   CHIEF COMPLAINT:  CC:  IgG lambda plasma cell myeloma, stage II, standard risk   Alexandra Price 78 y.o. female was transferred to my care after her prior physician has left.   ASSESSMENT & PLAN:   Assessment & Plan: Alexandra Price  is a 78 y.o. female with Multiple myeloma  Assessment & Plan Multiple myeloma not having achieved remission (HCC) Relapsed IgG lambda multiple myeloma.  Extensive oncology history as below. Patient was initially diagnosed in 2019 followed by induction chemotherapy with KCd, followed by bone marrow transplantation.  Patient was on maintenance pomalidomide  for several years prior to relapse and since then has been on daratumumab , Velcade  and dexamethasone   - Patient tolerating daratumumab  Velcade  and dexamethasone  well.  Patient does have some peripheral neuropathy that has been stable with the use of gabapentin . - We discussed labs from today.  There is slight worsening of M spike from 0.1 to 0.2.  There is also significant elevation in free lambda light chains.  This is concerning for progression of disease. - CAR-T was discussed as next line of treatment by Dr. Rogers.  Patient does have good functional status and I do believe that this will be a good line of treatment for the patient - Will refer to Dr. Arman at [facility] for evaluation of CAR-T - Continue acyclovir  as prescribed - Physical exam stable today.  Proceed with treatment tomorrow.  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  She knows to contact our office if she develops concerns prior to her next appointment.  45 minutes of total time was spent for this patient encounter, including preparation,review of records,  face-to-face counseling with the patient and coordination of care, physical exam, and documentation of the encounter.    Alexandra Price,acting as a Neurosurgeon for [name], MD.,have documented all relevant documentation on the behalf of [name], MD,as directed by  [name], MD while in the presence of [name], MD.  I, [name] MD, have reviewed the above documentation for accuracy and completeness, and I agree with the above.     [name], MD  [facility] [place] - A DEPT OF [facility] [address]  [place] [address] Dept: [phone] Dept Fax: [phone]   No orders of the defined types were placed in this encounter.    ONCOLOGY HISTORY:   I have reviewed her chart and materials related to her  cancer extensively and collaborated history with the patient. Summary of oncologic history is as follows:   Diagnosis:  IgG lambda plasma cell myeloma, stage II, standard risk   -Initial Presentation: Left-sided rib pain, hypercalcemia, anemia, and AKI requiring 3 day  hospitalization.  -12/29/2017: CMP: Calcium  11.4. Creatinine 1.33. Alkaline Phosphatase 133. HGB 10.0. -12/30/2017: Initial Myeloma labs: M-spike 3.2. Total protein 10. Gamma globulin 4. Total globulin 6.5. IgG 4,950. Haptoglobin 244. Beta-2  Microglobulin 3.9.  -12/31/2017: Bone Marrow Biopsy.  Pathology: Hypercellular bone marrow (40-50%) involved by plasma cell neoplasm (17% plasma cells by aspirate, greater than 90% and 30-40% by CD138 IHC and flow cytometry).  -Flow Cytometry: The first population of 4% cells is gated on plasma cells characterized by CD138/CD38 co-expression. The plasma cells are lambda-restricted, show aberrant expression of CD56, and lack expression of CD19, CD20, and CD117. The second population of 10% cells is gated on the lymphocyte region and is composed of 59% T-cells (CD4: CDS ratio is 3.4:1) without an aberrant immunophenotype. 18% polytypic B-cells. and 23% NK-cells.  -Chromosome Analysis: Abnormal Karyotype : Complex karyotype. 57,X,-X,+1,dic(1;12)(p1?3;q24.?3),+3,+5,+6,+7,+9,add(9)(q22),+11,+15,+19,+20,+21,+22,+mar[8]/46,XX[12]  -01/08/2018: QOOR:8426.4, FKLC:17.3, ratio:90.9 -01/10/2018: UPEP: M spike:74.1, M spike/24Hr: 782 -04/17/2018-08/21/2018: 4 cycles of Kyprolis  + Cytoxan  +Dex + Pomalidomide (KCd) -07/23/2018: Bone Marrow Biopsy.  Pathology: Hypercellular marrow (40-90%) with extensive involvement by plasma cell myeloma (50-90% plasma cells). By CD138 immunohistochemistry plasma cells comprise approximately 30-40% of the core, and 90% of cells on the clot. Myeloid and erythroid elements as well as megakaryocytes are rare, but morphologically unremarkable.  -08/04/2018: Quant Immunoglobulins: IgG 497. IgM <20. IgA 30.  -08/04/2018: Other Myeloma labs: M-spike 0.32. IgG lambda immunofixation. Normal FLC ratio. Creatinine 1.62. -09/17/2018: PET:  Innumerable lytic osseous lesions involving the calvarium and the axial and appendicular skeleton without appreciable FDG  uptake in keeping with patient's history of multiple myeloma.  -10/23/2018: Stem cell transplant -11/19/2018: No M-spike observed. Immunoglobulins grossly normal. Elevated kappa free light chains at 42.5 with FLC ratio at 4.05.  -01/21/2019: PET:  Similar appearance of innumerable lytic osseous lesions involving the calvarium, and the axial and appendicular skeleton in comparison to 09/17/2018 in keeping with history of multiple myeloma. There remains no significant FDG uptake associated with these lesions. Previously described foci of uptake in the left second and right sixth ribs are not present on this study and likely to have reflected a fracture that is now healed. Unchanged remote compression fractures of the T11 and L4 vertebral bodies.  -01/21/2019: Bone Marrow Biopsy.  -Pathology: Normocellular trilineage hematopoiesis with maturation, no  increased blasts (2%). No increased plasma cells (<1% by CD138 immunohistochemical stain).   -MRD negative -03/10/2019-06/08/2022: Maintenance Pomalyst  2 mg 3 weeks on/1 week off, held from 03/29/2021 to 04/26/2021 secondary to dizziness, discontinued due to progression -10/29/2019: Bone Marrow Biopsy.  Pathology: Trilineage hematopoiesis with no evidence of plasma cells.  Chromosome analysis and FISH are normal.  -10/27/2020: Bone Marrow Biopsy.  Pathology: normocellular marrow with 30 to 40%  trilineage hematopoiesis.  2% of the total cells are CD 138+ plasma cells.  -11/16/2021: Myeloma labs at [facility]. M-spike 0.24. Immunofixation positive for IgG lambda type Bence-Jones protein. FKLC:39.75, QOOR:55.70, ratio:0.90 -12/14/2021: PET: No findings of FDG avid osseous or soft tissue myeloma. Mild degradation secondary to extensive muscular activity which could be due to motion after radiopharmaceutical injection and/or recent insulin  administration. Diffuse lytic lesions throughout the marrow space are similar and most consistent with treated myeloma. -  Patient developed left shoulder blade and right posterior rib pain for  the last 3 to 4 months.  -12/14/2021: 24-hour urine: total protein 136 mg. Immunofixation positive for lambda type Bence-Jones protein.  -05/24/2022: PET: Single new hypermetabolic lytic myelomatous lesion involving the left lamina of T11. No canal encroachment. No other hypermetabolic bone lesions are identified. Stable diffuse lytic myelomatous lesions throughout the axial and appendicular skeleton. No significant findings involving the neck, chest, abdomen or pelvis. -05/24/2022: M-spike 0.4. Immunofixation shows IgG monoclonal protein with lambda light chain  specificity and monoclonal free lambda light chains.  -06/11/2022-current: Daratumumab , Velcade  and dexamethasone   -06/20/2022: Radiation therapy to T11 lesion  -06/11/2023: MRI Brain: No evidence of acute intracranial abnormality.  -03/04/2024: MRI Thoracic Spine: Severe degenerative disc disease at L1-L2 with Modic type 1 changes in the vertebral endplates. Old compression fracture of T11. No acute fracture. -04/22/2024: M-spike 0.2. Elevated lambda light chains at 116.6 with FLC ratio at 0.05.    Current Treatment:  Daratumumab , Velcade  and dexamethasone    INTERVAL HISTORY:   BLAKLEIGH STRAW is here today for follow up and to establish care with me for plasma cell myeloma.   She experiences significant tremors, particularly in her hands, and her speech has become slower. She also has occasional difficulty with walking, describing episodes where her legs 'kind of go out on me.' Numbness and tingling are present, which she describes as 'bad,' and she is currently taking gabapentin , it is more or less stable.  No recent falls, and she remains active around the house, performing tasks such as house cleaning. She reports sinus issues and red eyes, although they are not as bad today. No cough or cold.  We discussed the worsening multiple myeloma labs and CART as the  next line treatment option as discussed with Dr.Katragadda previously. Patient is [facility].   I have reviewed the past medical history, past surgical history, social history and family history with the patient and they are unchanged from previous note.  ALLERGIES:  is allergic to ciprofloxacin , amoxicillin , morphine and codeine, and augmentin [amoxicillin -pot clavulanate].  MEDICATIONS:  Current Outpatient Medications  Medication Sig Dispense Refill   acetaminophen  (TYLENOL ) 325 MG tablet Take 650 mg by mouth as needed for moderate pain (prior to infusion).     acyclovir  (ZOVIRAX ) 400 MG tablet Take 1 tablet (400 mg total) by mouth 2 (two) times daily. 60 tablet 6   ALPRAZolam  (XANAX ) 0.25 MG tablet Take one tablet (0.25 mg) by mouth in the morning as needed, and two tablets (0.5 mg) at bedtime as needed 90 tablet 2   Calcium  Carb-Cholecalciferol  (CALCIUM  1000 + D PO) Take 1,000 mg by mouth daily.     ciclopirox (PENLAC) 8 % solution Apply topically daily.     clotrimazole-betamethasone (LOTRISONE) cream APPLY CREAM EXTERNALY TWICE A DAY FOR 30 DAYS; Duration: 30     dexAMETHasone  20 MG TABS Take 1 tablet by mouth as directed. Take 1 tablet 30 minutes prior to infusions (Patient taking differently: Take 1 tablet by mouth as directed. Take 1 tablet 30 minutes prior to the Darzelex injections-once a month) 30 tablet 2   diclofenac  Sodium (VOLTAREN ) 1 % GEL 4 gram every 8 hours as needed. 100 g 11   diphenhydrAMINE  (BENADRYL ) 25 MG tablet Take 50 mg by mouth as needed (prior to infusion).     ELIQUIS  2.5 MG TABS tablet Take 2.5 mg by mouth 2 (two) times daily.     escitalopram  (LEXAPRO ) 20 MG tablet Take 1 tablet by mouth once daily  30 tablet 0   furosemide  (LASIX ) 20 MG tablet Take 1 tablet (20 mg total) by mouth as needed (in am as needed for swelling). 30 tablet 2   gabapentin  (NEURONTIN ) 300 MG capsule Take 1 capsule (300 mg total) by mouth 3 (three) times  daily. 90 capsule 0   glipiZIDE  (GLUCOTROL ) 5 MG tablet Take 1 tablet (5 mg total) by mouth daily before breakfast. 90 tablet 0   HYDROcodone -acetaminophen  (NORCO) 10-325 MG tablet Take 1 tablet by mouth every 6 (six) hours as needed. 120 tablet 0   ketoconazole (NIZORAL) 2 % cream Apply topically 2 (two) times daily.     lidocaine -prilocaine  (EMLA ) cream Apply 1 Application topically as needed. 1 squeeze every 8 hours 30 g 11   magnesium  oxide (MAG-OX) 400 (240 Mg) MG tablet Take 1 tablet (400 mg total) by mouth 2 (two) times daily. 60 tablet 3   midodrine  (PROAMATINE ) 2.5 MG tablet Take 1 tablet (2.5 mg total) by mouth 3 (three) times daily with meals. 90 tablet 1   Multiple Vitamin (MULTI VITAMIN) TABS Take 1 tablet by mouth daily.     pantoprazole  (PROTONIX ) 40 MG tablet Take 1 tablet by mouth once daily 90 tablet 1   polyethylene glycol (MIRALAX  / GLYCOLAX ) 17 g packet Take 17 g by mouth daily as needed for moderate constipation. 14 each 0   prochlorperazine  (COMPAZINE ) 10 MG tablet Take 1 tablet (10 mg total) by mouth every 6 (six) hours as needed for nausea or vomiting. 30 tablet 0   rosuvastatin  (CRESTOR ) 10 MG tablet Take 10 mg by mouth daily.     Semaglutide (OZEMPIC, 0.25 OR 0.5 MG/DOSE, [place]) Inject 0.25 mg into the skin once a week.     zolpidem  (AMBIEN ) 10 MG tablet Take 1 tablet (10 mg total) by mouth at bedtime as needed. 30 tablet 5   No current facility-administered medications for this visit.    REVIEW OF SYSTEMS:   Constitutional: Denies fevers, chills or abnormal weight loss Eyes: Denies blurriness of vision Ears, nose, mouth, throat, and face: Denies mucositis or sore throat Respiratory: Denies cough, dyspnea or wheezes Cardiovascular: Denies palpitation, chest discomfort or lower extremity swelling Gastrointestinal:  Denies nausea, heartburn or change in bowel habits Skin: Denies abnormal skin rashes Lymphatics: Denies new lymphadenopathy or easy  bruising Neurological:Denies numbness, tingling or new weaknesses Behavioral/Psych: Mood is stable, no new changes  All other systems were reviewed with the patient and are negative.   VITALS:  There were no vitals taken for this visit.  Wt Readings from Last 3 Encounters:  05/05/24 186 lb 4.6 oz (84.5 kg)  04/22/24 187 lb 12.8 oz (85.2 kg)  04/08/24 188 lb 15 oz (85.7 kg)    There is no height or weight on file to calculate BMI.  Performance status (ECOG): 1 - Symptomatic but completely ambulatory  PHYSICAL EXAM:   GENERAL:alert, no distress and comfortable SKIN: skin color, texture, turgor are normal, no rashes or significant lesions LYMPH:  no palpable lymphadenopathy in the cervical, axillary or inguinal LUNGS: clear to auscultation and percussion with normal breathing effort HEART: regular rate & rhythm and no murmurs and no lower extremity edema ABDOMEN:abdomen soft, non-tender and normal bowel sounds Musculoskeletal:no cyanosis of digits and no clubbing  NEURO: alert & oriented x 3 with fluent speech, no focal motor/sensory deficits  LABORATORY DATA:  I have reviewed the data as listed  Lab Results  Component Value Date   WBC 4.6 05/05/2024   NEUTROABS 3.2  05/05/2024   HGB 9.8 (L) 05/05/2024   HCT 32.0 (L) 05/05/2024   MCV 98.5 05/05/2024   PLT 157 05/05/2024      Chemistry      Component Value Date/Time   NA 140 05/05/2024 1353   K 4.4 05/05/2024 1353   CL 101 05/05/2024 1353   CO2 27 05/05/2024 1353   BUN 27 (H) 05/05/2024 1353   CREATININE 1.70 (H) 05/05/2024 1353      Component Value Date/Time   CALCIUM  8.9 05/05/2024 1353   ALKPHOS 77 05/05/2024 1353   AST 23 05/05/2024 1353   ALT 26 05/05/2024 1353   BILITOT 0.6 05/05/2024 1353      Latest Reference Range & Units 04/22/24 10:57  Total Protein ELP 6.0 - 8.5 g/dL 6.1  Albumin  ELP 2.9 - 4.4 g/dL 3.5  Globulin, Total 2.2 - 3.9 g/dL 2.6 (C)  A/G Ratio 0.7 - 1.7  1.3 (C)  Alpha-1-Globulin 0.0 -  0.4 g/dL 0.3  Alexandra Price 0.4 - 1.0 g/dL 0.9  Beta Globulin 0.7 - 1.3 g/dL 1.0  Gamma Globulin 0.4 - 1.8 g/dL 0.5  M-SPIKE, % Not Observed g/dL 0.2 (H)  SPE Interp.  Comment  Comment  Comment  (H): Data is abnormally high (C): Corrected   Latest Reference Range & Units 04/22/24 10:57  Kappa free light chain 3.3 - 19.4 mg/L 5.9  Lambda free light chains 5.7 - 26.3 mg/L 116.6 (H)  Kappa, lambda light chain ratio 0.26 - 1.65  0.05 (L)  (H): Data is abnormally high (L): Data is abnormally low  RADIOGRAPHIC STUDIES: I have personally reviewed the radiological images as listed and agreed with the findings in the report.  MR Thoracic Spine W Wo Contrast CLINICAL DATA:  Mid back pain  EXAM: MRI THORACIC WITHOUT AND WITH CONTRAST  TECHNIQUE: Multiplanar and multiecho pulse sequences of the thoracic spine were obtained without and with intravenous contrast.  CONTRAST:  10mL GADAVIST  GADOBUTROL  1 MMOL/ML IV SOLN  COMPARISON:  None Available.  FINDINGS: Alignment: Accentuated kyphosis  Bone marrow signal: There are Modic type 1 changes in the vertebral endplates at L1-L2 noted.  There is an old compression fracture of T11.  No acute fracture.  Thoracic spinal cord: Normal  Facet joints: No significant abnormality  Intervertebral discs: There is a broad-based disc bulge at L1-L2 without significant spinal stenosis. No thoracic disc herniation.  Paraspinal tissues: No significant abnormality  IMPRESSION: 1. Severe degenerative disc disease at L1-L2 with Modic type 1 changes in the vertebral endplates 2. Old compression fracture of T11 3. No acute fracture  Electronically Signed   By: Nancyann Burns M.D.   On: 03/04/2024 11:49

## 2024-05-05 NOTE — Assessment & Plan Note (Addendum)
 Patient reports some occasional tremors in her hands and also walking difficulty that is not related to her peripheral neuropathy  - Recommended to follow-up with neurologist for further evaluation

## 2024-05-06 ENCOUNTER — Inpatient Hospital Stay

## 2024-05-06 ENCOUNTER — Other Ambulatory Visit: Payer: Self-pay | Admitting: *Deleted

## 2024-05-06 ENCOUNTER — Other Ambulatory Visit: Payer: Self-pay

## 2024-05-06 VITALS — BP 127/77 | HR 78 | Temp 97.5°F | Resp 18

## 2024-05-06 DIAGNOSIS — Z5112 Encounter for antineoplastic immunotherapy: Secondary | ICD-10-CM | POA: Diagnosis not present

## 2024-05-06 DIAGNOSIS — C9000 Multiple myeloma not having achieved remission: Secondary | ICD-10-CM

## 2024-05-06 MED ORDER — BORTEZOMIB CHEMO SQ INJECTION 3.5 MG (2.5MG/ML)
1.0000 mg/m2 | Freq: Once | INTRAMUSCULAR | Status: AC
  Administered 2024-05-06: 2 mg via SUBCUTANEOUS
  Filled 2024-05-06: qty 0.8

## 2024-05-06 MED ORDER — ALPRAZOLAM 0.25 MG PO TABS: ORAL_TABLET | ORAL | 2 refills | Status: DC

## 2024-05-06 NOTE — Progress Notes (Addendum)
 Patient presents today for Velcade  injection.  Patient is in satisfactory condition with no new complaints voiced.  Vital signs are stable.  Labs reviewed by Dr.Kandala on 05/05/24 and all labs are within treatment parameters.  We will proceed with treatment per MD orders.    Treatment given today per MD orders. Tolerated infusion without adverse affects. Vital signs stable. No complaints at this time. Discharged from clinic ambulatory in stable condition. Alert and oriented x 3. F/U with [facility] as scheduled.

## 2024-05-06 NOTE — Patient Instructions (Signed)
 [facility] [place] - [facility] OF [facility]  Discharge Instructions: Thank you for choosing [facility] to provide your oncology and hematology care.  If you have a lab appointment with the Cancer Center - please note that after April 8th, 2024, all labs will be drawn in the cancer center.  You do not have to check in or register with the main entrance as you have in the past but will complete your check-in in the cancer center.  Wear comfortable clothing and clothing appropriate for easy access to any Portacath or PICC line.   We strive to give you quality time with your provider. You may need to reschedule your appointment if you arrive late (15 or more minutes).  Arriving late affects you and other patients whose appointments are after yours.  Also, if you miss three or more appointments without notifying the office, you may be dismissed from the clinic at the provider's discretion.      For prescription refill requests, have your pharmacy contact our office and allow 72 hours for refills to be completed.    Today you received the following chemotherapy and/or immunotherapy agents Velcade      To help prevent nausea and vomiting after your treatment, we encourage you to take your nausea medication as directed.  BELOW ARE SYMPTOMS THAT SHOULD BE REPORTED IMMEDIATELY: *FEVER GREATER THAN 100.4 F (38 C) OR HIGHER *CHILLS OR SWEATING *NAUSEA AND VOMITING THAT IS NOT CONTROLLED WITH YOUR NAUSEA MEDICATION *UNUSUAL SHORTNESS OF BREATH *UNUSUAL BRUISING OR BLEEDING *URINARY PROBLEMS (pain or burning when urinating, or frequent urination) *BOWEL PROBLEMS (unusual diarrhea, constipation, pain near the anus) TENDERNESS IN MOUTH AND THROAT WITH OR WITHOUT PRESENCE OF ULCERS (sore throat, sores in mouth, or a toothache) UNUSUAL RASH, SWELLING OR PAIN  UNUSUAL VAGINAL DISCHARGE OR ITCHING   Items with * indicate a potential emergency and should be followed up  as soon as possible or go to the Emergency Department if any problems should occur.  Please show the CHEMOTHERAPY ALERT CARD or IMMUNOTHERAPY ALERT CARD at check-in to the Emergency Department and triage nurse.  Should you have questions after your visit or need to cancel or reschedule your appointment, please contact [facility] [place] - A DEPT OF [facility] [phone]  and follow the prompts.  Office hours are 8:00 a.m. to 4:30 p.m. Monday - Friday. Please note that voicemails left after 4:00 p.m. may not be returned until the following business day.  We are closed weekends and major holidays. You have access to a nurse at all times for urgent questions. Please call the main number to the clinic [phone] and follow the prompts.  For any non-urgent questions, you may also contact your provider using MyChart. We now offer e-Visits for anyone 44 and older to request care online for non-urgent symptoms. For details visit [email or web address].   Also download the MyChart app! Go to the app store, search "MyChart", open the app, select [place], and log in with your MyChart username and password.

## 2024-05-06 NOTE — Addendum Note (Signed)
 Addended by: Garnett Rekowski on: 05/06/2024 11:56 AM   Modules accepted: Orders

## 2024-05-07 ENCOUNTER — Other Ambulatory Visit: Payer: Self-pay

## 2024-05-12 ENCOUNTER — Other Ambulatory Visit: Payer: Self-pay | Admitting: *Deleted

## 2024-05-12 MED ORDER — ESCITALOPRAM OXALATE 20 MG PO TABS: 20.0000 mg | ORAL_TABLET | Freq: Every day | ORAL | 0 refills | Status: DC

## 2024-05-12 MED ORDER — GLIPIZIDE 5 MG PO TABS: 5.0000 mg | ORAL_TABLET | Freq: Every day | ORAL | 0 refills | Status: DC

## 2024-05-14 ENCOUNTER — Other Ambulatory Visit: Payer: Self-pay | Admitting: *Deleted

## 2024-05-20 ENCOUNTER — Encounter: Payer: Self-pay | Admitting: Oncology

## 2024-05-20 ENCOUNTER — Inpatient Hospital Stay

## 2024-05-20 ENCOUNTER — Inpatient Hospital Stay: Attending: Hematology

## 2024-05-20 VITALS — BP 145/78 | HR 70 | Temp 96.8°F | Resp 18 | Ht 66.0 in

## 2024-05-20 DIAGNOSIS — I2782 Chronic pulmonary embolism: Secondary | ICD-10-CM | POA: Diagnosis not present

## 2024-05-20 DIAGNOSIS — C9002 Multiple myeloma in relapse: Secondary | ICD-10-CM | POA: Insufficient documentation

## 2024-05-20 DIAGNOSIS — Z5111 Encounter for antineoplastic chemotherapy: Secondary | ICD-10-CM | POA: Diagnosis present

## 2024-05-20 DIAGNOSIS — M549 Dorsalgia, unspecified: Secondary | ICD-10-CM | POA: Insufficient documentation

## 2024-05-20 DIAGNOSIS — G629 Polyneuropathy, unspecified: Secondary | ICD-10-CM | POA: Diagnosis not present

## 2024-05-20 DIAGNOSIS — Z7901 Long term (current) use of anticoagulants: Secondary | ICD-10-CM | POA: Insufficient documentation

## 2024-05-20 DIAGNOSIS — Z5112 Encounter for antineoplastic immunotherapy: Secondary | ICD-10-CM | POA: Insufficient documentation

## 2024-05-20 DIAGNOSIS — G8929 Other chronic pain: Secondary | ICD-10-CM | POA: Insufficient documentation

## 2024-05-20 DIAGNOSIS — F419 Anxiety disorder, unspecified: Secondary | ICD-10-CM | POA: Diagnosis not present

## 2024-05-20 DIAGNOSIS — C9000 Multiple myeloma not having achieved remission: Secondary | ICD-10-CM

## 2024-05-20 LAB — CBC WITH DIFFERENTIAL/PLATELET
Abs Immature Granulocytes: 0.01 K/uL (ref 0.00–0.07)
Basophils Absolute: 0 K/uL (ref 0.0–0.1)
Basophils Relative: 1 %
Eosinophils Absolute: 0.2 K/uL (ref 0.0–0.5)
Eosinophils Relative: 4 %
HCT: 31.8 % — ABNORMAL LOW (ref 36.0–46.0)
Hemoglobin: 9.8 g/dL — ABNORMAL LOW (ref 12.0–15.0)
Immature Granulocytes: 0 %
Lymphocytes Relative: 15 %
Lymphs Abs: 0.6 K/uL — ABNORMAL LOW (ref 0.7–4.0)
MCH: 30.2 pg (ref 26.0–34.0)
MCHC: 30.8 g/dL (ref 30.0–36.0)
MCV: 97.8 fL (ref 80.0–100.0)
Monocytes Absolute: 0.4 K/uL (ref 0.1–1.0)
Monocytes Relative: 10 %
Neutro Abs: 2.9 K/uL (ref 1.7–7.7)
Neutrophils Relative %: 70 %
Platelets: 158 K/uL (ref 150–400)
RBC: 3.25 MIL/uL — ABNORMAL LOW (ref 3.87–5.11)
RDW: 14.1 % (ref 11.5–15.5)
WBC: 4.2 K/uL (ref 4.0–10.5)
nRBC: 0 % (ref 0.0–0.2)

## 2024-05-20 LAB — COMPREHENSIVE METABOLIC PANEL WITH GFR
ALT: 28 U/L (ref 0–44)
AST: 27 U/L (ref 15–41)
Albumin: 4.3 g/dL (ref 3.5–5.0)
Alkaline Phosphatase: 85 U/L (ref 38–126)
Anion gap: 13 (ref 5–15)
BUN: 21 mg/dL (ref 8–23)
CO2: 27 mmol/L (ref 22–32)
Calcium: 9.4 mg/dL (ref 8.9–10.3)
Chloride: 102 mmol/L (ref 98–111)
Creatinine, Ser: 1.63 mg/dL — ABNORMAL HIGH (ref 0.44–1.00)
GFR, Estimated: 32 mL/min — ABNORMAL LOW (ref >60)
Glucose, Bld: 149 mg/dL — ABNORMAL HIGH (ref 70–99)
Potassium: 4.5 mmol/L (ref 3.5–5.1)
Sodium: 142 mmol/L (ref 135–145)
Total Bilirubin: 0.3 mg/dL (ref 0.0–1.2)
Total Protein: 6.6 g/dL (ref 6.5–8.1)

## 2024-05-20 LAB — MAGNESIUM: Magnesium: 2.2 mg/dL (ref 1.7–2.4)

## 2024-05-20 MED ORDER — DARATUMUMAB-HYALURONIDASE-FIHJ 1800-30000 MG-UT/15ML ~~LOC~~ SOLN
1800.0000 mg | Freq: Once | SUBCUTANEOUS | Status: AC
  Administered 2024-05-20: 1800 mg via SUBCUTANEOUS
  Filled 2024-05-20: qty 15

## 2024-05-20 MED ORDER — BORTEZOMIB CHEMO SQ INJECTION 3.5 MG (2.5MG/ML)
1.0000 mg/m2 | Freq: Once | INTRAMUSCULAR | Status: AC
  Administered 2024-05-20: 2 mg via SUBCUTANEOUS
  Filled 2024-05-20: qty 0.8

## 2024-05-20 NOTE — Progress Notes (Signed)
 Patient presents today for chemotherapy injections of Velcade  and Darzalex .  Patient is in satisfactory condition with no new complaints voiced.  Vital signs are stable.  Labs reviewed and all labs are within treatment parameters.  We will proceed with treatment per MD orders.    Patient tolerated injections in right and left lower abd with no complaints voiced.  Site clean and dry with no bruising or swelling noted.  No complaints of pain.  Discharged with vital signs stable and no signs or symptoms of distress noted.

## 2024-05-20 NOTE — Patient Instructions (Signed)
 [facility] [place] - A DEPT OF [place]  Discharge Instructions: Thank you for choosing [facility] to provide your oncology and hematology care.  If you have a lab appointment with the Cancer Center - please note that after April 8th, 2024, all labs will be drawn in the cancer center.  You do not have to check in or register with the main entrance as you have in the past but will complete your check-in in the cancer center.  Wear comfortable clothing and clothing appropriate for easy access to any Portacath or PICC line.   We strive to give you quality time with your provider. You may need to reschedule your appointment if you arrive late (15 or more minutes).  Arriving late affects you and other patients whose appointments are after yours.  Also, if you miss three or more appointments without notifying the office, you may be dismissed from the clinic at the provider's discretion.      For prescription refill requests, have your pharmacy contact our office and allow 72 hours for refills to be completed.    Today you received the following chemotherapy and/or immunotherapy agents bortezomib , Darzalex  faspro     To help prevent nausea and vomiting after your treatment, we encourage you to take your nausea medication as directed.  BELOW ARE SYMPTOMS THAT SHOULD BE REPORTED IMMEDIATELY: *FEVER GREATER THAN 100.4 F (38 C) OR HIGHER *CHILLS OR SWEATING *NAUSEA AND VOMITING THAT IS NOT CONTROLLED WITH YOUR NAUSEA MEDICATION *UNUSUAL SHORTNESS OF BREATH *UNUSUAL BRUISING OR BLEEDING *URINARY PROBLEMS (pain or burning when urinating, or frequent urination) *BOWEL PROBLEMS (unusual diarrhea, constipation, pain near the anus) TENDERNESS IN MOUTH AND THROAT WITH OR WITHOUT PRESENCE OF ULCERS (sore throat, sores in mouth, or a toothache) UNUSUAL RASH, SWELLING OR PAIN  UNUSUAL VAGINAL DISCHARGE OR ITCHING   Items with * indicate a potential emergency and  should be followed up as soon as possible or go to the Emergency Department if any problems should occur.  Please show the CHEMOTHERAPY ALERT CARD or IMMUNOTHERAPY ALERT CARD at check-in to the Emergency Department and triage nurse.  Should you have questions after your visit or need to cancel or reschedule your appointment, please contact [facility] [place] - A DEPT OF [facility] [phone]  and follow the prompts.  Office hours are 8:00 a.m. to 4:30 p.m. Monday - Friday. Please note that voicemails left after 4:00 p.m. may not be returned until the following business day.  We are closed weekends and major holidays. You have access to a nurse at all times for urgent questions. Please call the main number to the clinic [phone] and follow the prompts.  For any non-urgent questions, you may also contact your provider using MyChart. We now offer e-Visits for anyone 14 and older to request care online for non-urgent symptoms. For details visit [email or web address].   Also download the MyChart app! Go to the app store, search MyChart, open the app, select [place], and log in with your MyChart username and password.

## 2024-05-20 NOTE — Progress Notes (Signed)
 Patient took all her pre-meds including her steroid this am. Proceed with treatment today as planned.

## 2024-05-21 ENCOUNTER — Other Ambulatory Visit: Payer: Self-pay | Admitting: *Deleted

## 2024-05-21 DIAGNOSIS — C9000 Multiple myeloma not having achieved remission: Secondary | ICD-10-CM

## 2024-05-21 MED ORDER — ACYCLOVIR 400 MG PO TABS: 400.0000 mg | ORAL_TABLET | Freq: Two times a day (BID) | ORAL | 6 refills | Status: DC

## 2024-05-26 NOTE — Progress Notes (Signed)
 Comprehensive Geriatric Assessment Consultation     Reason for Referral: Comprehensive Geriatric Assessment and Recommendations Referring Provider: Dr. Arman Diagnosis: Multiple Myeloma    Impression and Recommendations:  [name] is a 78 y.o. year-old with PMH HTN, CKD, diabetes mellitus, PE, GERD, anxiety, peripheral neuropathy and multiple myeloma who was referred for a comprehensive geriatric assessment which includes assessments in the following domains: Physical performance/functional status, cognitive function, nutritional status, social support, financial stressors, co-morbidities including polypharmacy. Vulnerabilities in these domains are listed below with recommendations.  Physical performance/Functional Status Patient is independent in ADLs and IADLs. She has chronic pain that does physically limit her. Despite that, she performed well on the short physical performance battery with a score of 11/12. She walks on her treadmill approximately 2 times weekly but remains active in her home with 4 great danes and 1 miniature poodle, taking care of the dogs and keeping the home clean. She has suffered from 1 fall in the last 6 months, from a detergent spill in her laundry room - slip and fall. She did not injure herself.  Recommendation: Recommend better pain control, please see below. Otherwise, no interventions recommended.  Cognitive Function Patient reports increased short term memory loss since re-starting treatment for the multiple myeloma. She reports bad short term memory loss from the high dose chemotherapy with her prior SCT but this improved over time after going through transplant and throughout her remission. Despite patient's concern for her cognitive function, she scored overall well on the MoCA with 26/30. She screened positive for depression, but did not appear clinically depressed. She is taking Lexapro  and Xanax  and experiencing social problems with her son.   Recommendations: In anticipation of worsening memory or cognitive difficulties, recommend providing detailed instructions with each visit. If patient is hospitalized, recommend Occupational Therapy consult for delirium prevention strategies. Consider CPSP referral; patient has appointment with Dr. Tawni for psychosocial screening on 06/15/24.   Nutritional Status She has experienced some nausea controlled with prn zofran  during her most recent treatments. She experienced significant n/v with her auto SCT. Her appetite is good and she screened healthy.  Recommendations: No interventions recommended.  Co-morbidities, Polypharmacy Include HTN, CKD, diabetes mellitus, PE, GERD, peripheral neuropathy, anxiety. Patient does meet criteria for polypharmacy (>5 medications). She is taking several medications that meet BEERS criteria including gabapentin , norco, Xanax  and Ambien . I updated her Xanax  and gabapentin  instructions on her medicine list as they have changed. Her pain does not appear to be well controlled, especially her back pain. She is taking advantage of voltaren  gel and heating pad to help ease her pain. We discussed referral to palliative care for symptom management but patient politely declined.  Recommendations: Improve pain control for chronic pain, offered palliative care consult, patient politely declined. Currently her local oncologist is prescribing her medications. Monitor for drug-drug interactions.  Social Support/Financial Stressors She has support through her two sons. One of her sons lives with her, but she is experiencing a lot of anxiety over him currently, for which she did not divulge details. She has support through her church. She is not experiencing any financial stress.  Recommendations: Consider CPSP referral.  *Of note, patient is interested in the GOKART trial.  Unless otherwise mentioned, no other vulnerabilities were found. Assessment detailed below.  A  Comprehensive Geriatric Assessment was performed and an overview of the results is offered below:   Domain GA results Recommendations/ Interventions  Functional Status  ADLs: Independent in 6 of 6.  IADLs: Independent  in 8 of 8. No intervention recommended   Physical performance  Short Physical Performance Battery score: 11/12  (A score of 9 or < is associated with impairment)   History of falls: 1   Assistive device use: no No intervention recommended.    Cognitive function  MOCA score: 26    A score of 18-25 indicates mild cognitive impairment  A score of 10-17 indicates moderate cognitive impairment  A score under 10 indicates severe cognitive impairment   Recommendations: Provide detailed written instructions with each visit. Avoid delirium by preventing dehydration, seeking care for symptoms of infection immediately. Titrate opioids carefully. If hospitalized, consider OT consult for delirium prevention strategies.    Comorbidity burden   The Cumulative Illness Rating Severity - Geriatrics score revealed high comorbidity burden.  PE GERD DM, HTN, CKD Peripheral neuropathy Chronic back pain   Offered palliative care consult for pain control, patient politely declined  Chronic kidney disease is an independent risk factor for survival despite the type of cancer. Recommend proper hydration.   Patient has several co-morbidities but none of them are severe enough to effect her ADLs and IADLs  Nutritional status  Mini Nutritional Assessment reveals the patient is healthy   No intervention recommended.   Polypharmacy and potentially inappropriate medications  Formal pharmacist review and BEERS criteria Patient is taking several medications that meet BEERS criteria including gabapentin , norco, Xanax  and Ambien . This will need to be monitored closely while receiving CAR T therapy due to the side effect profiles but also neurotoxicity with CAR T therapy.  Social Support  CARG social support scale reveals adequate support No intervention recommended   Depression Geriatric Depression Scale Score: 6 A score of greater than or equal to 5 suggests depression Did not appear clinically depressed. She is taking Lexapro  and Xanax  and experiencing social problems with her son.  Consider CPSP referral.  Life expectancy and Prognostic Indices (Lee Index) E-prognosis: 17 years outside of cancer diagnosis  This aids in treatment decision making  Financial Toxicity Patient responded no to question. No intervention needed at this time.   Reference: CreditChaos.com.ee http://allen-schneider.[email or web address].[email or web address]   Oncology History  Multiple myeloma (HCC)  09/17/2018 Initial Diagnosis   Multiple myeloma (HCC)   10/22/2018 - 11/07/2018 Chemotherapy   OP ONC BMT Multiple Myeloma Autologous Transplant Melphalan - Commercial Plan Provider: Rogue Evern Nian, MD Treatment goal: Control Line of treatment: Transplant   11/15/2022 - 11/15/2022 Supportive Treatment   Adult Peripheral IV and Central Venous Access Orders Plan Provider: Dorn Dallas Ehrlich, MD   11/27/2023 -  Supportive Treatment   Adult Peripheral IV and Central Venous Access Orders Plan Provider: Dorn Dallas Ehrlich, MD      History of Present Illness  [name] is a 78 y.o. female with PMH GERD, CKD, PE, HTN, anxiety, DDD and multiple myeloma initially diagnosed in 12/2017 after presenting with left sided chest pain initially atributed to rib fractures in the setting of bronchitis. Imaging showed multiple lytic lesions, prompting a workup for MM.  She was found to have a hemoglobin of 9.8, creatinine of 1.33, SPEP with M-spike of 3.2, hypercalcemia, B2 microglobulin of 3.9.  Bone marrow biopsy was performed 12/31/17 and showed 40% involvement with clonal plasma cells. She was diagnosed with myeloma and started on induction  treatment. She received Carfilzomib , Cytoxan , and Dexamethasone  (Ckd) on 04/17/18. With Cycle 2 she was changed to KRd: Carfilzomib , Revlimid , and Dexamethasone  and was maintained on this until 07/2018 when she was changed to Carfilzomib ,  Pomalidomide , and Dexamethasone  because of a desquamative rash attributed to her Revlimid  and she described as pretty bad but it did not require hospitalization. She was referred to the myeloma clinic and seen on 08/04/18 for transplant evaluation. Serology at that time showed an M-spike of 0.32 and serum free light chain ratio of 0.67 (kappa of 7.16 and lambda of 10.71) consistent with a VGPR. She underwent HDCT with Melphalan followed by stem cell rescue on 10/23/2018. Complications to transplant were non-infectious diarrhea managed with imodium , Grade II mucositis managed with MMW. She started pomalidomide  maintennace 01/2019. She remained with no detectable disease by serology and tolerated maintenance well. She underwent a marrow at 1 year post-transplant to assess MRD status and persistent remission. She was found to have relapsed in 10/2021. DaraVd was started in 05/2022. She also had radiation to a T11 lesion in 06/2022.  Patient states that she has been tolerating her treatment well since she was diagnosed with relapsed disease. She states that the treatment does leave her fatigued but she is completing all of her ADLs and IADLs. She is active daily keeping her house clean as she has 4 great danes and a miniature poodle. She needs to sweep the floors daily due to the dog hair. She walks on her treadmill probably twice weekly. She has chronic back pain that does limit her physically, but she states that things need to get done, so she just does them anyway. Her back pain increases with physical activity. She will lie down with a heating pad for approximately 20 minutes or so and this will ease off the pain. She can then get back up and resume her activities.  She tells me  that she experienced a lot of short term memory problems after her SCT but with time it did improve. She states that her memory has worsened again since re-starting treatment. She has word-finding difficulty. She has also noticed that she is stuttering more but believes this is related to the word-finding difficulty.   Review of Systems:  A Complete ROS was performed and negative if not mentioned above.   Medical History[1]   Surgical History[2]   Medications Ordered Prior to Encounter[3]   Allergies[4]   Social History   Socioeconomic History  . Marital status: Legally Separated    Spouse name: Not on file  . Number of children: Not on file  . Years of education: Not on file  . Highest education level: Not on file  Occupational History  . Not on file  Tobacco Use  . Smoking status: Never  . Smokeless tobacco: Never  Substance and Sexual Activity  . Alcohol use: Never  . Drug use: Never  . Sexual activity: Not on file  Other Topics Concern  . Not on file  Social History Narrative   Lives at home with her son and 3 small dogs in [place], [place]   Social Drivers of Health   Food Insecurity: No Food Insecurity (09/17/2022)   Received [facility]   Food vital sign   . Within the past 12 months, you worried that your food would run out before you got money to buy more: Never true   . Within the past 12 months, the food you bought just didn't last and you didn't have money to get more: Never true  Transportation Needs: No Transportation Needs (09/17/2022)   Received [facility] - Transportation   . Lack of Transportation (Medical): No   . Lack  of Transportation (Non-Medical): No  Safety: Not At Risk (09/17/2022)   Received from [facility]   Safety   . Within the last year, have you been afraid of your partner or ex-partner?: No   . Within the last year, have you been humiliated or emotionally abused in other ways by your partner or ex-partner?: No   .  Within the last year, have you been kicked, hit, slapped, or otherwise physically hurt by your partner or ex-partner?: No   . Within the last year, have you been raped or forced to have any kind of sexual activity by your partner or ex-partner?: No  Living Situation: Not on file      Family History[5]   Vitals BP (!) 126/54 (BP Location: Left arm, Patient Position: Sitting)   Pulse 78   Temp 97.6 F (36.4 C) (Temporal)   Ht 1.651 m (5' 5)   Wt 87.1 kg (192 lb)   SpO2 98%   BMI 31.95 kg/m    Physical Examination GENERAL: Well developed, well nourished, in no acute distress. NEUROLOGIC: Alert and oriented x 3. No focal deficits PSYCH: Pleasant appropriate affect and normal thought content.    MoCA [facility] Cognitive Assessment for Dementia): Patient's total score on the MoCA is 26. A score of 18-25 may be indicative of mild cognitive impairment, 10-17 moderate cognitive impairment, and less than 10 severe cognitive impairment.   MoCA          Score  Visuospatial / 5 5   Naming / 3 3   Attention Digits / 2 2   Attention Letters / 1 1   Attention Subtraction / 3 3   Language Repeat / 2 2   Language Fluency / 1 1   Abstraction / 2 1   Delayed Recall / 5 2   Orientation / 6 6      MoCA Total / 30   Adjusted for level of education (add 1 point for less than grade 12) 0   Total 26     Patient's Self Physical Assessment: Vigorous activities, such as running, lifting heavy objects, participating in strenuous activities: Very Difficult Moderate activities, such as moving a table, pushing a vacuum cleaner, bowling, or playing golf: A Little Difficult Lifting or carrying groceries: A Little Difficult Climbing several flights of stairs: Very Difficult Climbing one flight of stairs: Not Difficult Bending, kneeling, or stooping: A Little Difficult Walking more than a mile: Very Difficult Walking several blocks: Very Difficult Walking one block: Very Difficult Bathing oneself:  Not Difficult   Short Physical Performance Battery (SPPB): Balance testing Side by side: 10sec Semi-tandem: 10sec  Full tandem: 10sec  Total balance score = 4 (range 0-4)   5 repeat chair stands 13.09sec     Score: 3 (range 0-4)  4 meter walk Trial 1: 5.19sec  Trial 2: 3.19sec   Gait speed score: 4 (range 0-4)  Total SPPB score 11 (range 0-12, higher scores indicate better function, <9 indicates physical frailty)     Mini Nutrition Assessment: Food intake declined in past three months: No, 2pt Weight loss during past three months:  No, 3pt Able to get out of bed/ chair and goes out: Yes, 2pt Suffered psychological distress or acute disease in past 3 months: yes, 0pt Neuropsychological problems: No, 2pt BMI :32, 3pt Score: 12 [Screening Scores 0-7: Malnourished 8-11: At risk of malnutrition 12-14: Normal nutritional status]  Social Support:  Someone to help you if you were confined to bed: All  of the time Someone to take you to the doctor if needed: All of the time Someone to prepare your meals if you are unable to do it yourself: All of the time Someone to help you with daily chores if you were sick: All of the time Someone to have a good time with: Most of the time Someone to turn to for suggestions about a personal problem: Most of the time Someone who understands your problems: Most of the time Someone to love and make you feel wanted: Most of the time   Laboratory Review Flowsheet  More data exists      Latest Ref Rng & Units 06/02/2024 11/27/2023 11/15/2022 11/16/2021 10/27/2020 04/23/2020 04/22/2020  Common Labs  Hemoglobin 12.3 - 15.3 g/dL 9.7*  9.9*  9.8*  89.8*  9.8*  9.3*  9.4*   Hematocrit 35.9 - 44.6 % 28.7*  30.2*  29.0*  29.7*  28.6*  27.2*  27.4*   Platelet Count (Plt) 150 - 450 10*3/uL 147*  136*  121*  93*  209  104*  108*   WBC 4.40 - 11.00 10*3/uL 3.90*  3.50*  4.10*  2.4*  3.6*  3.3*  3.2*   Sodium 136 - 145 mmol/L 139  139  140  139  139  138  140    Potassium 3.4 - 4.5 mmol/L 3.8  4.4  3.8  4.4  4.0  4.6  4.4   Glucose 70 - 99 mg/dL 70  879*  99  884*  878*  93  104*   Magnesium  1.9 - 2.7 mg/dL 2.0  - - - - 1.9  1.8   Blood Urea Nitrogen 7 - 25 mg/dL 24  29*  25  24  21   31*  31*   Creatinine 0.60 - 1.20 mg/dL 8.44*  8.35*  8.60*  8.42*  1.51*  1.44  1.49   Uric Acid 2.3 - 6.6 mg/dL 6.1  - - - - - -  Calcium  Level Total 8.6 - 10.3 mg/dL 8.6  9.2  8.7  8.8  9.3  8.2*  8.1*   Albumin  3.5 - 5.7 g/dL 4.1  4.5  3.9  4.1  3.9  4.3  4.1  3.7  3.6   Aspartate Aminotransferase (AST) 13 - 39 U/L 19  24  13  12  12  15  17    Alanine Aminotransferase 7 - 52 U/L 23  35  14  12  9  13  13    Bilirubin Total 0.3 - 1.0 mg/dL 0.5  0.4  0.4  0.5  0.4  0.4  0.4   INR <5.0 0.9  - - - - - -  LDH 140 - 271 U/L 160  - - - - - -  Total Protein 6.4 - 8.9 g/dL 6.5  6.6  6.7  6.0*  6.0*  6.8  6.3*  6.7  6.5  5.6*  5.6*     Details       Multiple values from one day are sorted in reverse-chronological order          #Clinical Trial Screening: Patient meets criteria for the GOKART trial and is interested. I have emailed the coordinators.   Thank you for referring this patient for comprehensive geriatric assessment.   I spent 60 minutes with patient; more than 50% of the time was spent taking history, performing pertinant exams and counseling patient   Sheena Coventry, [name] Geriatric Oncology Consult Clinic [facility]       [  1] Past Medical History: Diagnosis Date  . Depression   . HTN (hypertension)   . Osteopenia   . Prediabetes   . PTSD (post-traumatic stress disorder)    Related to childhood trauma  [2] Past Surgical History: Procedure Laterality Date  . APPENDECTOMY  05/2018   Procedure: APPENDECTOMY  . HYSTERECTOMY   1995   Procedure: HYSTERECTOMY; Menorrhagia  [3] Current Outpatient Medications on File Prior to Visit  Medication Sig Dispense Refill  . acetaminophen  (TYLENOL ) 325 mg tablet Take 650 mg by  mouth every 6 (six) hours as needed.    . acyclovir  (ZOVIRAX ) 400 mg tablet Take 400 mg by mouth 2 (two) times a day.    . apixaban  (Eliquis ) 5 mg tab Take 5 mg by mouth 2 (two) times a day. Indications: prevention of pulmonary thromboembolism recurrence 180 tablet 3  . calcium  carbonate (OS-CAL) 500 mg calcium  (1,250 mg) chewable tablet Take 1,000 mg by mouth Once Daily.    . diclofenac  sodium (VOLTAREN ) 1 % gel Apply 1 g topically 3 (three) times a day.    . escitalopram  (LEXAPRO ) 20 mg tablet 1 tablet    . furosemide  (LASIX ) 20 mg tablet Take 20 mg by mouth daily as needed.    . glipiZIDE  (GLUCOTROL ) 5 mg tablet Take 5 mg by mouth daily with breakfast.    . HYDROcodone -acetaminophen  (NORCO) 7.5-325 mg per tablet Take 1 tablet by mouth every 6 (six) hours as needed.    . midodrine  (PROAMATINE ) 2.5 mg tablet Take 2.5 mg by mouth 3 (three) times a day.    . multivitamin with folic acid  (Quintabs) 400 mcg tab Take 1 tablet by mouth Once Daily. 30 tablet 5  . ondansetron  (ZOFRAN ) 4 mg tablet TAKE 1 TABLET BY MOUTH EVERY 8 HOURS AS NEEDED FOR NAUSEA AND VOMITING  1  . pantoprazole  (PROTONIX ) 40 mg EC tablet TAKE 1 TABLET BY MOUTH ONCE DAILY  1  . polyethylene glycol (MIRALAX ) 17 gram powd powder Take by mouth daily.    . rosuvastatin  (CRESTOR ) 10 mg tablet Take 10 mg by mouth Once Daily. 90 tablet 3  . zolpidem  (AMBIEN ) 10 mg tablet TAKE 1 TABLET BY MOUTH ONCE DAILY AT BEDTIME AS NEEDED FOR SLEEP  0  . [DISCONTINUED] ALPRAZolam  (XANAX ) 0.5 mg tablet Take 0.5 mg by mouth Once Daily.    . [DISCONTINUED] gabapentin  (NEURONTIN ) 100 mg capsule Take 1 capsule (100 mg total) by mouth every morning AND 2 capsules (200 mg total) every evening. 90 capsule 0  . [DISCONTINUED] magnesium  oxide 400 mg (241 mg magnesium ) tab Take 400 mg by mouth daily.    . citalopram  (CeleXA ) 40 mg tablet Take 40 mg by mouth Once Daily. (Patient not taking: Reported on 06/02/2024)    . [DISCONTINUED] aspirin  81 mg chewable tablet  1 tablet (Patient not taking: Reported on 06/02/2024)    . [DISCONTINUED] Pomalyst  2 mg chemo capsule Take 2 mg by mouth Once Daily. (Patient not taking: Reported on 06/02/2024)    . [DISCONTINUED] semaglutide (OZEMPIC) 0.25 mg or 0.5 mg(2 mg/1.5 mL) subcutaneous pen injector Inject 0.5 mg under the skin every 7 days. (Patient not taking: Reported on 06/02/2024)     Current Facility-Administered Medications on File Prior to Visit  Medication Dose Route Frequency Provider Last Rate Last Admin  . [COMPLETED] fludeoxyglucose  F-18 (FDG) injection 12.203 millicurie  12.203 millicurie intravenous Once in imaging Dorn Dallas Ehrlich, MD   12.203 millicurie at 06/02/24 1032  [4] Allergies Allergen Reactions  . Olanzapine   Other (See Comments)    EPS  . Ciprofloxacin  Hives  . Morphine GI Intolerance  . Amoxicillin -Pot Clavulanate Other (See Comments)    Headache, insomnia  [5] Family History Problem Relation Name Age of Onset  . Colon cancer Brother    . Leukemia Neg Hx    . Lymphoma Neg Hx    . Multiple myeloma Neg Hx    *Some images could not be shown.

## 2024-05-28 ENCOUNTER — Other Ambulatory Visit: Payer: Self-pay | Admitting: *Deleted

## 2024-05-28 DIAGNOSIS — C9000 Multiple myeloma not having achieved remission: Secondary | ICD-10-CM

## 2024-05-28 MED ORDER — HYDROCODONE-ACETAMINOPHEN 10-325 MG PO TABS: 1.0000 | ORAL_TABLET | Freq: Four times a day (QID) | ORAL | 0 refills | Status: DC | PRN

## 2024-06-03 ENCOUNTER — Inpatient Hospital Stay

## 2024-06-03 ENCOUNTER — Inpatient Hospital Stay (HOSPITAL_BASED_OUTPATIENT_CLINIC_OR_DEPARTMENT_OTHER): Admitting: Oncology

## 2024-06-03 ENCOUNTER — Other Ambulatory Visit: Payer: Self-pay | Admitting: *Deleted

## 2024-06-03 DIAGNOSIS — F419 Anxiety disorder, unspecified: Secondary | ICD-10-CM | POA: Diagnosis not present

## 2024-06-03 DIAGNOSIS — G622 Polyneuropathy due to other toxic agents: Secondary | ICD-10-CM

## 2024-06-03 DIAGNOSIS — R251 Tremor, unspecified: Secondary | ICD-10-CM | POA: Diagnosis not present

## 2024-06-03 DIAGNOSIS — Z5112 Encounter for antineoplastic immunotherapy: Secondary | ICD-10-CM | POA: Diagnosis not present

## 2024-06-03 DIAGNOSIS — I2782 Chronic pulmonary embolism: Secondary | ICD-10-CM

## 2024-06-03 DIAGNOSIS — C9000 Multiple myeloma not having achieved remission: Secondary | ICD-10-CM | POA: Diagnosis not present

## 2024-06-03 LAB — COMPREHENSIVE METABOLIC PANEL WITH GFR
ALT: 28 U/L (ref 0–44)
AST: 25 U/L (ref 15–41)
Albumin: 4.1 g/dL (ref 3.5–5.0)
Alkaline Phosphatase: 84 U/L (ref 38–126)
Anion gap: 9 (ref 5–15)
BUN: 23 mg/dL (ref 8–23)
CO2: 29 mmol/L (ref 22–32)
Calcium: 9.2 mg/dL (ref 8.9–10.3)
Chloride: 102 mmol/L (ref 98–111)
Creatinine, Ser: 1.63 mg/dL — ABNORMAL HIGH (ref 0.44–1.00)
GFR, Estimated: 32 mL/min — ABNORMAL LOW (ref >60)
Glucose, Bld: 178 mg/dL — ABNORMAL HIGH (ref 70–99)
Potassium: 4.2 mmol/L (ref 3.5–5.1)
Sodium: 140 mmol/L (ref 135–145)
Total Bilirubin: 0.3 mg/dL (ref 0.0–1.2)
Total Protein: 6.5 g/dL (ref 6.5–8.1)

## 2024-06-03 LAB — CBC WITH DIFFERENTIAL/PLATELET
Abs Immature Granulocytes: 0.01 K/uL (ref 0.00–0.07)
Basophils Absolute: 0 K/uL (ref 0.0–0.1)
Basophils Relative: 1 %
Eosinophils Absolute: 0.1 K/uL (ref 0.0–0.5)
Eosinophils Relative: 3 %
HCT: 29.9 % — ABNORMAL LOW (ref 36.0–46.0)
Hemoglobin: 9.3 g/dL — ABNORMAL LOW (ref 12.0–15.0)
Immature Granulocytes: 0 %
Lymphocytes Relative: 14 %
Lymphs Abs: 0.6 K/uL — ABNORMAL LOW (ref 0.7–4.0)
MCH: 30.3 pg (ref 26.0–34.0)
MCHC: 31.1 g/dL (ref 30.0–36.0)
MCV: 97.4 fL (ref 80.0–100.0)
Monocytes Absolute: 0.3 K/uL (ref 0.1–1.0)
Monocytes Relative: 8 %
Neutro Abs: 3 K/uL (ref 1.7–7.7)
Neutrophils Relative %: 74 %
Platelets: 149 K/uL — ABNORMAL LOW (ref 150–400)
RBC: 3.07 MIL/uL — ABNORMAL LOW (ref 3.87–5.11)
RDW: 14.5 % (ref 11.5–15.5)
WBC: 4 K/uL (ref 4.0–10.5)
nRBC: 0 % (ref 0.0–0.2)

## 2024-06-03 LAB — MAGNESIUM: Magnesium: 2.3 mg/dL (ref 1.7–2.4)

## 2024-06-03 MED ORDER — HYDROCODONE-ACETAMINOPHEN 10-325 MG PO TABS: 1.0000 | ORAL_TABLET | ORAL | 0 refills | Status: DC | PRN

## 2024-06-03 NOTE — Patient Instructions (Signed)
 [facility] at [facility] Discharge Instructions   You were seen and examined today by Dr. Davonna.  She reviewed the results of your lab work which are normal/stable.   We will hold your treatment today.   Return as scheduled.    Thank you for choosing [facility] at [facility] to provide your oncology and hematology care.  To afford each patient quality time with our provider, please arrive at least 15 minutes before your scheduled appointment time.   If you have a lab appointment with the Cancer Center please come in thru the Main Entrance and check in at the main information desk.  You need to re-schedule your appointment should you arrive 10 or more minutes late.  We strive to give you quality time with our providers, and arriving late affects you and other patients whose appointments are after yours.  Also, if you no show three or more times for appointments you may be dismissed from the clinic at the providers discretion.     Again, thank you for choosing [facility].  Our hope is that these requests will decrease the amount of time that you wait before being seen by our physicians.       _____________________________________________________________  Should you have questions after your visit to [facility], please contact our office at [phone] and follow the prompts.  Our office hours are 8:00 a.m. and 4:30 p.m. Monday - Friday.  Please note that voicemails left after 4:00 p.m. may not be returned until the following business day.  We are closed weekends and major holidays.  You do have access to a nurse 24-7, just call the main number to the clinic [phone] and do not press any options, hold on the line and a nurse will answer the phone.    For prescription refill requests, have your pharmacy contact our office and allow 72 hours.    Due to Covid, you will need to wear a mask upon entering the  hospital. If you do not have a mask, a mask will be given to you at the Main Entrance upon arrival. For doctor visits, patients may have 1 support person age 53 or older with them. For treatment visits, patients can not have anyone with them due to social distancing guidelines and our immunocompromised population.

## 2024-06-03 NOTE — Progress Notes (Addendum)
 Patient Care Team: Katrinka Aquas, MD as PCP - General (Internal Medicine) Cindie Carlin POUR, DO as Consulting Physician (Gastroenterology)  Clinic Day:  06/03/2024  Referring physician: Katrinka Aquas, MD   CHIEF COMPLAINT:  CC:  IgG lambda plasma cell myeloma, stage II, standard risk    ASSESSMENT & PLAN:   Assessment & Plan: Alexandra Price  is a 78 y.o. female with Multiple myeloma  Multiple myeloma not having achieved remission Relapsed IgG lambda multiple myeloma.  Extensive oncology history as below. Patient was initially diagnosed in 2019 followed by induction chemotherapy with KCd, followed by bone marrow transplantation.  Patient was on maintenance pomalidomide  for several years prior to relapse and since then has been on daratumumab , Velcade  and dexamethasone    - Patient was evaluated by Dr. Arman at [facility] for CAR-T.  Patient is willing to proceed with CAR-T therapy at this time and is waiting to hear back. -Patient has worsening numbness and tingling in her hands.  Will hold Velcade  today. -Last dose of daratumumab  was on 05/20/2024. - We reviewed labs from today: CMP: Creatinine: 1.63-stable, normal left.  CBC: Hemoglobin: 9.3, platelets: 149, WBC: Normal.  Free light chains done yesterday at [facility]: Free lambda light chain: 198.99, free kappa light chain: 5.71 - Continue acyclovir  as prescribed   Return to clinic in 4 weeks to discuss further management.  If patient is scheduled for CAR-T in the meantime, we will see her after CAR-T collection for maintenance treatment.  Chronic back pain Likely secondary to previous T11 compression fracture and also degenerative disc disease at L1 and L2   - Increase hydrocodone  10-325 2 every 4 hours as needed  Anxiety Anxiety related to multiple myeloma diagnosis and also family issues  -Continue Xanax  as prescribed  Occasional tremors Stable at this time  Polyneuropathy Likely  secondary to Velcade .  Patient reports some worsening in her fingertips  -Continue gabapentin  300 mg 3 times daily - Will hold Velcade  at this time.  Chronic PE - Continue Eliquis  5 mg twice daily  The patient understands the plans discussed today and is in agreement with them.  She knows to contact our office if she develops concerns prior to her next appointment.  The total time spent in the appointment was 40 minutes for the encounter with patient, including review of chart and various tests results, discussions about plan of care and coordination of care plan    [name], MD  [facility] [place] - A DEPT OF [name] Dept: [phone] Dept Fax: [phone]   No orders of the defined types were placed in this encounter.    ONCOLOGY HISTORY:   I have reviewed her chart and materials related to her cancer extensively and collaborated history with the patient. Summary of oncologic history is as follows:   Diagnosis:  IgG lambda plasma cell myeloma, stage II, standard risk   -Initial Presentation: Left-sided rib pain, hypercalcemia, anemia, and AKI requiring 3 day hospitalization.  -12/29/2017: CMP: Calcium  11.4. Creatinine 1.33. Alkaline Phosphatase 133. HGB 10.0. -12/30/2017: Initial Myeloma labs: M-spike 3.2. Total protein 10. Gamma globulin 4. Total globulin 6.5. IgG 4,950. Haptoglobin 244. Beta-2  Microglobulin 3.9.  -12/31/2017: Bone Marrow Biopsy.  Pathology: Hypercellular bone marrow (40-50%) involved by plasma cell neoplasm (17% plasma cells by aspirate, greater than 90% and 30-40% by CD138 IHC and flow cytometry).  -Flow Cytometry: The first population  of 4% cells is gated on plasma cells characterized by CD138/CD38 co-expression. The plasma cells are lambda-restricted, show aberrant expression of CD56, and lack expression of CD19, CD20, and CD117. The second population  of 10% cells is gated on the lymphocyte region and is composed of 59% T-cells (CD4: CDS ratio is 3.4:1) without an aberrant immunophenotype. 18% polytypic B-cells. and 23% NK-cells.  -Chromosome Analysis: Abnormal Karyotype : Complex karyotype. 57,X,-X,+1,dic(1;12)(p1?3;q24.?3),+3,+5,+6,+7,+9,add(9)(q22),+11,+15,+19,+20,+21,+22,+mar[8]/46,XX[12]  -01/08/2018: QOOR:8426.4, FKLC:17.3, ratio:90.9 -01/10/2018: UPEP: M spike:74.1, M spike/24Hr: 782 -04/17/2018-08/21/2018: 4 cycles of Kyprolis  + Cytoxan  +Dex + Pomalidomide (KCd) -07/23/2018: Bone Marrow Biopsy.  Pathology: Hypercellular marrow (40-90%) with extensive involvement by plasma cell myeloma (50-90% plasma cells). By CD138 immunohistochemistry plasma cells comprise approximately 30-40% of the core, and 90% of cells on the clot. Myeloid and erythroid elements as well as megakaryocytes are rare, but morphologically unremarkable.  -08/04/2018: Quant Immunoglobulins: IgG 497. IgM <20. IgA 30.  -08/04/2018: Other Myeloma labs: M-spike 0.32. IgG lambda immunofixation. Normal FLC ratio. Creatinine 1.62. -09/17/2018: PET:  Innumerable lytic osseous lesions involving the calvarium and the axial and appendicular skeleton without appreciable FDG uptake in keeping with patient's history of multiple myeloma.  -10/23/2018: Stem cell transplant -11/19/2018: No M-spike observed. Immunoglobulins grossly normal. Elevated kappa free light chains at 42.5 with FLC ratio at 4.05.  -01/21/2019: PET:  Similar appearance of innumerable lytic osseous lesions involving the calvarium, and the axial and appendicular skeleton in comparison to 09/17/2018 in keeping with history of multiple myeloma. There remains no significant FDG uptake associated with these lesions. Previously described foci of uptake in the left second and right sixth ribs are not present on this study and likely to have reflected a fracture that is now healed. Unchanged remote compression fractures of the T11  and L4 vertebral bodies.  -01/21/2019: Bone Marrow Biopsy.  -Pathology: Normocellular trilineage hematopoiesis with maturation, no  increased blasts (2%). No increased plasma cells (<1% by CD138 immunohistochemical stain).   -MRD negative -03/10/2019-06/08/2022: Maintenance Pomalyst  2 mg 3 weeks on/1 week off, held from 03/29/2021 to 04/26/2021 secondary to dizziness, discontinued due to progression -10/29/2019: Bone Marrow Biopsy.  Pathology: Trilineage hematopoiesis with no evidence of plasma cells.  Chromosome analysis and FISH are normal.  -10/27/2020: Bone Marrow Biopsy.  Pathology: normocellular marrow with 30 to 40%  trilineage hematopoiesis.  2% of the total cells are CD 138+ plasma cells.  -11/16/2021: Myeloma labs at [facility]. M-spike 0.24. Immunofixation positive for IgG lambda type Bence-Jones protein. FKLC:39.75, QOOR:55.70, ratio:0.90 -12/14/2021: PET: No findings of FDG avid osseous or soft tissue myeloma. Mild degradation secondary to extensive muscular activity which could be due to motion after radiopharmaceutical injection and/or recent insulin  administration. Diffuse lytic lesions throughout the marrow space are similar and most consistent with treated myeloma. - Patient developed left shoulder blade and right posterior rib pain for the last 3 to 4 months.  -12/14/2021: 24-hour urine: total protein 136 mg. Immunofixation positive for lambda type Bence-Jones protein.  -05/24/2022: PET: Single new hypermetabolic lytic myelomatous lesion involving the left lamina of T11. No canal encroachment. No other hypermetabolic bone lesions are identified. Stable diffuse lytic myelomatous lesions throughout the axial and appendicular skeleton. No significant findings involving the neck, chest, abdomen or pelvis. -05/24/2022: M-spike 0.4. Immunofixation shows IgG monoclonal protein with lambda light chain  specificity and monoclonal free lambda light chains.  -06/11/2022-current: Daratumumab ,  Velcade  and dexamethasone   -06/20/2022: Radiation therapy to T11 lesion  -06/11/2023: MRI Brain: No evidence of acute intracranial abnormality.  -03/04/2024: MRI Thoracic Spine:  Severe degenerative disc disease at L1-L2 with Modic type 1 changes in the vertebral endplates. Old compression fracture of T11. No acute fracture. -04/22/2024: M-spike 0.2. Elevated lambda light chains at 116.6 with FLC ratio at 0.05.  - 05/19/2024: Evaluated for CAR-T at [facility]   Current Treatment:  Daratumumab , Velcade  and dexamethasone    INTERVAL HISTORY:   KENITHA GLENDINNING is here today for follow up for plasma cell myeloma.   Alexandra Price reports worsening neuropathy in her hands, but neuropathy in the lower extremities is unchanged. She is taking pain medication for her back pain but it is getting worse.Alexandra Price notes that her anxiety levels are unchanged. She is still taking XANAX  for her anxiety.   I have reviewed the past medical history, past surgical history, social history and family history with the patient and they are unchanged from previous note.  ALLERGIES:  is allergic to ciprofloxacin , amoxicillin , morphine and codeine, and augmentin [amoxicillin -pot clavulanate].  MEDICATIONS:  Current Outpatient Medications  Medication Sig Dispense Refill   acetaminophen  (TYLENOL ) 325 MG tablet Take 650 mg by mouth as needed for moderate pain (prior to infusion).     acyclovir  (ZOVIRAX ) 400 MG tablet Take 1 tablet (400 mg total) by mouth 2 (two) times daily. 60 tablet 6   ALPRAZolam  (XANAX ) 0.25 MG tablet Take one tablet (0.25 mg) by mouth in the morning as needed, and three tablets (0.75 mg) at bedtime as needed 120 tablet 2   Calcium  Carb-Cholecalciferol  (CALCIUM  1000 + D PO) Take 1,000 mg by mouth daily.     ciclopirox (PENLAC) 8 % solution Apply topically daily.     clotrimazole-betamethasone (LOTRISONE) cream APPLY CREAM EXTERNALY TWICE A DAY FOR 30 DAYS; Duration: 30      dexAMETHasone  20 MG TABS Take 1 tablet by mouth as directed. Take 1 tablet 30 minutes prior to infusions (Patient taking differently: Take 1 tablet by mouth as directed. Take 1 tablet 30 minutes prior to the Darzelex injections-once a month) 30 tablet 2   diclofenac  Sodium (VOLTAREN ) 1 % GEL 4 gram every 8 hours as needed. 100 g 11   diphenhydrAMINE  (BENADRYL ) 25 MG tablet Take 50 mg by mouth as needed (prior to infusion).     ELIQUIS  2.5 MG TABS tablet Take 2.5 mg by mouth 2 (two) times daily.     escitalopram  (LEXAPRO ) 20 MG tablet Take 1 tablet (20 mg total) by mouth daily. 30 tablet 0   furosemide  (LASIX ) 20 MG tablet Take 1 tablet (20 mg total) by mouth as needed (in am as needed for swelling). 30 tablet 2   gabapentin  (NEURONTIN ) 300 MG capsule Take 1 capsule (300 mg total) by mouth 3 (three) times daily. 90 capsule 0   glipiZIDE  (GLUCOTROL ) 5 MG tablet Take 1 tablet (5 mg total) by mouth daily before breakfast. 90 tablet 0   ketoconazole (NIZORAL) 2 % cream Apply topically 2 (two) times daily.     lidocaine -prilocaine  (EMLA ) cream Apply 1 Application topically as needed. 1 squeeze every 8 hours 30 g 11   magnesium  oxide (MAG-OX) 400 (240 Mg) MG tablet Take 1 tablet (400 mg total) by mouth 2 (two) times daily. 60 tablet 3   midodrine  (PROAMATINE ) 2.5 MG tablet Take 1 tablet (2.5 mg total) by mouth 3 (three) times daily with meals. 90 tablet 1   Multiple Vitamin (MULTI VITAMIN) TABS Take 1 tablet by mouth daily.     pantoprazole  (PROTONIX ) 40 MG tablet Take 1 tablet by  mouth once daily 90 tablet 1   polyethylene glycol (MIRALAX  / GLYCOLAX ) 17 g packet Take 17 g by mouth daily as needed for moderate constipation. 14 each 0   prochlorperazine  (COMPAZINE ) 10 MG tablet Take 1 tablet (10 mg total) by mouth every 6 (six) hours as needed for nausea or vomiting. 30 tablet 0   rosuvastatin  (CRESTOR ) 10 MG tablet Take 10 mg by mouth daily.     zolpidem  (AMBIEN ) 10 MG tablet Take 1 tablet (10 mg total) by  mouth at bedtime as needed. 30 tablet 5   HYDROcodone -acetaminophen  (NORCO) 10-325 MG tablet Take 1 tablet by mouth every 4 (four) hours as needed. 180 tablet 0   OZEMPIC, 0.25 OR 0.5 MG/DOSE, 2 MG/3ML SOPN INJECT 0.25 MG INTO THE SKIN ONCE A WEEK FOR 4 WEEKS, THEN 0.5 MG ONCE A WEEK. (Patient not taking: Reported on 06/03/2024)     No current facility-administered medications for this visit.    REVIEW OF SYSTEMS:   Constitutional: Denies fevers, chills or abnormal weight loss Eyes: Denies blurriness of vision Ears, nose, mouth, throat, and face: Denies mucositis or sore throat Respiratory: Denies cough, dyspnea or wheezes Cardiovascular: Denies palpitation, chest discomfort or lower extremity swelling Gastrointestinal:  Denies nausea, heartburn or change in bowel habits Skin: Denies abnormal skin rashes Lymphatics: Denies new lymphadenopathy or easy bruising Neurological:Denies numbness, tingling or new weaknesses Behavioral/Psych: Mood is stable, no new changes  All other systems were reviewed with the patient and are negative.   VITALS:  There were no vitals taken for this visit.  Wt Readings from Last 3 Encounters:  06/03/24 190 lb (86.2 kg)  05/20/24 190 lb 3.2 oz (86.3 kg)  05/05/24 186 lb 4.6 oz (84.5 kg)    There is no height or weight on file to calculate BMI.  Performance status (ECOG): 1 - Symptomatic but completely ambulatory  PHYSICAL EXAM:   GENERAL:alert, no distress and comfortable SKIN: skin color, texture, turgor are normal, no rashes or significant lesions LYMPH:  no palpable lymphadenopathy in the cervical, axillary or inguinal LUNGS: clear to auscultation and percussion with normal breathing effort HEART: regular rate & rhythm and no murmurs and no lower extremity edema ABDOMEN:abdomen soft, non-tender and normal bowel sounds Musculoskeletal:no cyanosis of digits and no clubbing  NEURO: alert & oriented x 3 with fluent speech, no focal motor/sensory  deficits  LABORATORY DATA:  I have reviewed the data as listed  Lab Results  Component Value Date   WBC 4.0 06/03/2024   NEUTROABS 3.0 06/03/2024   HGB 9.3 (L) 06/03/2024   HCT 29.9 (L) 06/03/2024   MCV 97.4 06/03/2024   PLT 149 (L) 06/03/2024      Chemistry      Component Value Date/Time   NA 140 06/03/2024 0852   K 4.2 06/03/2024 0852   CL 102 06/03/2024 0852   CO2 29 06/03/2024 0852   BUN 23 06/03/2024 0852   CREATININE 1.63 (H) 06/03/2024 0852      Component Value Date/Time   CALCIUM  9.2 06/03/2024 0852   ALKPHOS 84 06/03/2024 0852   AST 25 06/03/2024 0852   ALT 28 06/03/2024 0852   BILITOT 0.3 06/03/2024 0852      Latest Reference Range & Units 04/22/24 10:57  Total Protein ELP 6.0 - 8.5 g/dL 6.1  Albumin  ELP 2.9 - 4.4 g/dL 3.5  Globulin, Total 2.2 - 3.9 g/dL 2.6 (C)  A/G Ratio 0.7 - 1.7  1.3 (C)  Alpha-1-Globulin 0.0 - 0.4 g/dL 0.3  Alpha-2-Globulin 0.4 - 1.0 g/dL 0.9  Beta Globulin 0.7 - 1.3 g/dL 1.0  Gamma Globulin 0.4 - 1.8 g/dL 0.5  M-SPIKE, % Not Observed g/dL 0.2 (H)  SPE Interp.  Comment  Comment  Comment  (H): Data is abnormally high (C): Corrected   Latest Reference Range & Units 04/22/24 10:57  Kappa free light chain 3.3 - 19.4 mg/L 5.9  Lambda free light chains 5.7 - 26.3 mg/L 116.6 (H)  Kappa, lambda light chain ratio 0.26 - 1.65  0.05 (L)  (H): Data is abnormally high (L): Data is abnormally low  RADIOGRAPHIC STUDIES: I have personally reviewed the radiological images as listed and agreed with the findings in the report.

## 2024-06-03 NOTE — Progress Notes (Signed)
 Message received from A.Lenon RN / Dr. Davonna treatment held today.

## 2024-06-07 ENCOUNTER — Other Ambulatory Visit: Payer: Self-pay | Admitting: Oncology

## 2024-06-08 ENCOUNTER — Encounter: Payer: Self-pay | Admitting: Oncology

## 2024-06-11 ENCOUNTER — Telehealth: Payer: Self-pay

## 2024-06-11 DIAGNOSIS — G8929 Other chronic pain: Secondary | ICD-10-CM

## 2024-06-11 DIAGNOSIS — C9000 Multiple myeloma not having achieved remission: Secondary | ICD-10-CM

## 2024-06-11 NOTE — Telephone Encounter (Signed)
 Patient called the nurse triage line with complaints of left shoulder pain.  She had contacted the nurse coordinator at [facility] and was advised to contact Dr. Davonna.  Shoulder pain has been present for several months and Dr. Davonna is aware.  However, the pain has worsened x 2-3 weeks and she has very limited ROM now.  She is taking Hydrocodone  for pain with little relief.  Dr. Davonna made aware.  Left shoulder x-ray ordered per MD.  Patient made aware.

## 2024-06-14 ENCOUNTER — Telehealth: Payer: Self-pay | Admitting: Gastroenterology

## 2024-06-14 NOTE — Telephone Encounter (Signed)
 Patient needs appt within the next 1-2 weeks to discuss possible colonoscopy due to recent PET scan findings shared by her oncologist at [facility].   She can be seen by Dr. Cindie or any APP that has availability. If no availability, please let me know. Thank you.

## 2024-06-15 ENCOUNTER — Telehealth: Payer: Self-pay | Admitting: *Deleted

## 2024-06-15 ENCOUNTER — Ambulatory Visit (HOSPITAL_COMMUNITY)
Admission: RE | Admit: 2024-06-15 | Discharge: 2024-06-15 | Disposition: A | Discharge status: Home / Self Care | Source: Ambulatory Visit | Attending: Oncology | Admitting: Oncology

## 2024-06-15 DIAGNOSIS — M25512 Pain in left shoulder: Secondary | ICD-10-CM | POA: Diagnosis present

## 2024-06-15 DIAGNOSIS — G8929 Other chronic pain: Secondary | ICD-10-CM | POA: Diagnosis present

## 2024-06-15 DIAGNOSIS — C9000 Multiple myeloma not having achieved remission: Secondary | ICD-10-CM | POA: Diagnosis present

## 2024-06-15 NOTE — Telephone Encounter (Signed)
 Patient called to inquire about what she needs to do with her shoulder.  Advised that she was made aware on 10/23 that she needed an x ray of shoulder.  She did not recall the discussion.  Instructed her to go to radiology at her earliest convenience to get images done and appointment is not required.  Verbalized understanding.

## 2024-06-16 ENCOUNTER — Encounter: Payer: Self-pay | Admitting: Oncology

## 2024-06-16 ENCOUNTER — Other Ambulatory Visit: Payer: Self-pay | Admitting: Oncology

## 2024-06-16 DIAGNOSIS — C9000 Multiple myeloma not having achieved remission: Secondary | ICD-10-CM

## 2024-06-17 ENCOUNTER — Encounter: Payer: Self-pay | Admitting: Oncology

## 2024-06-17 ENCOUNTER — Inpatient Hospital Stay

## 2024-06-18 ENCOUNTER — Inpatient Hospital Stay

## 2024-06-18 ENCOUNTER — Other Ambulatory Visit: Payer: Self-pay | Admitting: *Deleted

## 2024-06-18 VITALS — BP 123/63 | HR 72 | Temp 98.4°F | Resp 20

## 2024-06-18 DIAGNOSIS — Z5112 Encounter for antineoplastic immunotherapy: Secondary | ICD-10-CM | POA: Diagnosis not present

## 2024-06-18 DIAGNOSIS — C9000 Multiple myeloma not having achieved remission: Secondary | ICD-10-CM

## 2024-06-18 LAB — COMPREHENSIVE METABOLIC PANEL WITH GFR
ALT: 28 U/L (ref 0–44)
AST: 25 U/L (ref 15–41)
Albumin: 4.2 g/dL (ref 3.5–5.0)
Alkaline Phosphatase: 92 U/L (ref 38–126)
Anion gap: 9 (ref 5–15)
BUN: 25 mg/dL — ABNORMAL HIGH (ref 8–23)
CO2: 32 mmol/L (ref 22–32)
Calcium: 9.2 mg/dL (ref 8.9–10.3)
Chloride: 99 mmol/L (ref 98–111)
Creatinine, Ser: 1.49 mg/dL — ABNORMAL HIGH (ref 0.44–1.00)
GFR, Estimated: 36 mL/min — ABNORMAL LOW (ref >60)
Glucose, Bld: 197 mg/dL — ABNORMAL HIGH (ref 70–99)
Potassium: 4.2 mmol/L (ref 3.5–5.1)
Sodium: 140 mmol/L (ref 135–145)
Total Bilirubin: 0.3 mg/dL (ref 0.0–1.2)
Total Protein: 6.6 g/dL (ref 6.5–8.1)

## 2024-06-18 LAB — CBC WITH DIFFERENTIAL/PLATELET
Abs Immature Granulocytes: 0.01 K/uL (ref 0.00–0.07)
Basophils Absolute: 0 K/uL (ref 0.0–0.1)
Basophils Relative: 1 %
Eosinophils Absolute: 0.1 K/uL (ref 0.0–0.5)
Eosinophils Relative: 4 %
HCT: 28.7 % — ABNORMAL LOW (ref 36.0–46.0)
Hemoglobin: 9 g/dL — ABNORMAL LOW (ref 12.0–15.0)
Immature Granulocytes: 0 %
Lymphocytes Relative: 21 %
Lymphs Abs: 0.6 K/uL — ABNORMAL LOW (ref 0.7–4.0)
MCH: 30.8 pg (ref 26.0–34.0)
MCHC: 31.4 g/dL (ref 30.0–36.0)
MCV: 98.3 fL (ref 80.0–100.0)
Monocytes Absolute: 0.4 K/uL (ref 0.1–1.0)
Monocytes Relative: 13 %
Neutro Abs: 1.7 K/uL (ref 1.7–7.7)
Neutrophils Relative %: 61 %
Platelets: 146 K/uL — ABNORMAL LOW (ref 150–400)
RBC: 2.92 MIL/uL — ABNORMAL LOW (ref 3.87–5.11)
RDW: 14.3 % (ref 11.5–15.5)
WBC: 2.8 K/uL — ABNORMAL LOW (ref 4.0–10.5)
nRBC: 0 % (ref 0.0–0.2)

## 2024-06-18 LAB — MAGNESIUM: Magnesium: 2.2 mg/dL (ref 1.7–2.4)

## 2024-06-18 MED ORDER — SODIUM CHLORIDE 0.9 % IV SOLN: INTRAVENOUS | Status: DC

## 2024-06-18 MED ORDER — SODIUM CHLORIDE 0.9 % IV SOLN
20.0000 mg | Freq: Once | INTRAVENOUS | Status: AC
  Administered 2024-06-18: 20 mg via INTRAVENOUS
  Filled 2024-06-18: qty 20

## 2024-06-18 MED ORDER — DEXAMETHASONE 4 MG PO TABS: ORAL_TABLET | ORAL | 3 refills | Status: AC

## 2024-06-18 MED ORDER — ACYCLOVIR 400 MG PO TABS: 400.0000 mg | ORAL_TABLET | Freq: Every day | ORAL | 3 refills | Status: DC

## 2024-06-18 MED ORDER — DEXTROSE 5 % IV SOLN
20.0000 mg/m2 | Freq: Once | INTRAVENOUS | Status: AC
  Administered 2024-06-18: 40 mg via INTRAVENOUS
  Filled 2024-06-18: qty 5

## 2024-06-18 MED ORDER — HYDROMORPHONE HCL 1 MG/ML IJ SOLN: 2.0000 mg | Freq: Once | INTRAMUSCULAR | Status: DC

## 2024-06-18 MED ORDER — PROCHLORPERAZINE MALEATE 10 MG PO TABS: 10.0000 mg | ORAL_TABLET | Freq: Four times a day (QID) | ORAL | 1 refills | Status: AC | PRN

## 2024-06-18 MED ORDER — LIDOCAINE-PRILOCAINE 2.5-2.5 % EX CREA: TOPICAL_CREAM | CUTANEOUS | 3 refills | Status: DC

## 2024-06-18 MED ORDER — DARATUMUMAB-HYALURONIDASE-FIHJ 1800-30000 MG-UT/15ML ~~LOC~~ SOLN
1800.0000 mg | Freq: Once | SUBCUTANEOUS | Status: AC
  Administered 2024-06-18: 1800 mg via SUBCUTANEOUS
  Filled 2024-06-18: qty 15

## 2024-06-18 MED ORDER — ONDANSETRON HCL 8 MG PO TABS
8.0000 mg | ORAL_TABLET | Freq: Three times a day (TID) | ORAL | 1 refills | Status: AC | PRN
Start: 2024-06-18 — End: ?

## 2024-06-18 MED ORDER — MONTELUKAST SODIUM 10 MG PO TABS
10.0000 mg | ORAL_TABLET | Freq: Once | ORAL | Status: AC
  Administered 2024-06-18: 10 mg via ORAL
  Filled 2024-06-18: qty 1

## 2024-06-18 MED ORDER — DIPHENHYDRAMINE HCL 25 MG PO CAPS
25.0000 mg | ORAL_CAPSULE | Freq: Once | ORAL | Status: AC
  Administered 2024-06-18: 25 mg via ORAL
  Filled 2024-06-18: qty 1

## 2024-06-18 MED ORDER — SODIUM CHLORIDE 0.9 % IV SOLN: Freq: Once | INTRAVENOUS | Status: AC

## 2024-06-18 MED ORDER — ACETAMINOPHEN 325 MG PO TABS
650.0000 mg | ORAL_TABLET | Freq: Once | ORAL | Status: AC
  Administered 2024-06-18: 650 mg via ORAL
  Filled 2024-06-18: qty 2

## 2024-06-18 NOTE — Progress Notes (Signed)
 Pharmacist Chemotherapy Monitoring - Initial Assessment    Anticipated start date: 06/18/24   The following has been reviewed per standard work regarding the patient's treatment regimen: The patient's diagnosis, treatment plan and drug doses, and organ/hematologic function Lab orders and baseline tests specific to treatment regimen  The treatment plan start date, drug sequencing, and pre-medications Prior authorization status  Patient's documented medication list, including drug-drug interaction screen and prescriptions for anti-emetics and supportive care specific to the treatment regimen The drug concentrations, fluid compatibility, administration routes, and timing of the medications to be used The patient's access for treatment and lifetime cumulative dose history, if applicable  The patient's medication allergies and previous infusion related reactions, if applicable   Changes made to treatment plan:  N/A  Follow up needed:  N/A   Bridge therapy for preparation for Car-T therapy.  See media notes with [facility] order instructions.  Does not need Type and Screen or Phenotype - had with previous Faspro therapy.  Alexandra Price, [facility], 06/18/2024  8:49 AM

## 2024-06-18 NOTE — Progress Notes (Signed)
 Patient presents today for Daratumumab  and D1C1 Kyprolis  per providers order.  Vital signs and labs within parameters for treatment.  Treatment given today per MD orders.  Tolerated infusion and injection without adverse affects.  Vital signs stable.  No complaints at this time.  Discharge from clinic ambulatory in stable condition.  Alert and oriented X 3.  Follow up with [facility] as scheduled.

## 2024-06-18 NOTE — Patient Instructions (Signed)
 [facility] [place] - A DEPT OF . [facility]  Discharge Instructions: Thank you for choosing [facility] to provide your oncology and hematology care.  If you have a lab appointment with the Cancer Center - please note that after April 8th, 2024, all labs will be drawn in the cancer center.  You do not have to check in or register with the main entrance as you have in the past but will complete your check-in in the cancer center.  Wear comfortable clothing and clothing appropriate for easy access to any Portacath or PICC line.   We strive to give you quality time with your provider. You may need to reschedule your appointment if you arrive late (15 or more minutes).  Arriving late affects you and other patients whose appointments are after yours.  Also, if you miss three or more appointments without notifying the office, you may be dismissed from the clinic at the provider's discretion.      For prescription refill requests, have your pharmacy contact our office and allow 72 hours for refills to be completed.    Today you received the following chemotherapy and/or immunotherapy agents Kyprolis /daratumumab       To help prevent nausea and vomiting after your treatment, we encourage you to take your nausea medication as directed.  BELOW ARE SYMPTOMS THAT SHOULD BE REPORTED IMMEDIATELY: *FEVER GREATER THAN 100.4 F (38 C) OR HIGHER *CHILLS OR SWEATING *NAUSEA AND VOMITING THAT IS NOT CONTROLLED WITH YOUR NAUSEA MEDICATION *UNUSUAL SHORTNESS OF BREATH *UNUSUAL BRUISING OR BLEEDING *URINARY PROBLEMS (pain or burning when urinating, or frequent urination) *BOWEL PROBLEMS (unusual diarrhea, constipation, pain near the anus) TENDERNESS IN MOUTH AND THROAT WITH OR WITHOUT PRESENCE OF ULCERS (sore throat, sores in mouth, or a toothache) UNUSUAL RASH, SWELLING OR PAIN  UNUSUAL VAGINAL DISCHARGE OR ITCHING   Items with * indicate a potential emergency and should be  followed up as soon as possible or go to the Emergency Department if any problems should occur.  Please show the CHEMOTHERAPY ALERT CARD or IMMUNOTHERAPY ALERT CARD at check-in to the Emergency Department and triage nurse.  Should you have questions after your visit or need to cancel or reschedule your appointment, please contact [facility] [place] - A DEPT OF [facility] [phone]  and follow the prompts.  Office hours are 8:00 a.m. to 4:30 p.m. Monday - Friday. Please note that voicemails left after 4:00 p.m. may not be returned until the following business day.  We are closed weekends and major holidays. You have access to a nurse at all times for urgent questions. Please call the main number to the clinic [phone] and follow the prompts.  For any non-urgent questions, you may also contact your provider using MyChart. We now offer e-Visits for anyone 45 and older to request care online for non-urgent symptoms. For details visit [email or web address].   Also download the MyChart app! Go to the app store, search MyChart, open the app, select , and log in with your MyChart username and password.

## 2024-06-23 ENCOUNTER — Telehealth: Payer: Self-pay | Admitting: *Deleted

## 2024-06-23 ENCOUNTER — Other Ambulatory Visit: Payer: Self-pay | Admitting: *Deleted

## 2024-06-23 MED ORDER — PANTOPRAZOLE SODIUM 40 MG PO TBEC
40.0000 mg | DELAYED_RELEASE_TABLET | Freq: Every day | ORAL | 1 refills | Status: AC
Start: 2024-06-23 — End: ?

## 2024-06-23 NOTE — Telephone Encounter (Signed)
 Patient continues to have severe left shoulder pain, despite stable bone lesions.  Pain has progressively gotten worse.  Will refer to orthopedic for evaluation and possible recommendations for relief of pain.  Patient aware.

## 2024-06-25 ENCOUNTER — Inpatient Hospital Stay: Attending: Hematology

## 2024-06-25 ENCOUNTER — Inpatient Hospital Stay

## 2024-06-25 VITALS — BP 149/74 | HR 84 | Temp 97.2°F | Resp 18

## 2024-06-25 DIAGNOSIS — Z5112 Encounter for antineoplastic immunotherapy: Secondary | ICD-10-CM | POA: Diagnosis present

## 2024-06-25 DIAGNOSIS — C9002 Multiple myeloma in relapse: Secondary | ICD-10-CM | POA: Diagnosis present

## 2024-06-25 DIAGNOSIS — E119 Type 2 diabetes mellitus without complications: Secondary | ICD-10-CM | POA: Diagnosis not present

## 2024-06-25 DIAGNOSIS — C9000 Multiple myeloma not having achieved remission: Secondary | ICD-10-CM

## 2024-06-25 LAB — CBC WITH DIFFERENTIAL/PLATELET
Abs Immature Granulocytes: 0.01 K/uL (ref 0.00–0.07)
Basophils Absolute: 0 K/uL (ref 0.0–0.1)
Basophils Relative: 1 %
Eosinophils Absolute: 0.1 K/uL (ref 0.0–0.5)
Eosinophils Relative: 4 %
HCT: 29.9 % — ABNORMAL LOW (ref 36.0–46.0)
Hemoglobin: 9.4 g/dL — ABNORMAL LOW (ref 12.0–15.0)
Immature Granulocytes: 0 %
Lymphocytes Relative: 16 %
Lymphs Abs: 0.6 K/uL — ABNORMAL LOW (ref 0.7–4.0)
MCH: 31.4 pg (ref 26.0–34.0)
MCHC: 31.4 g/dL (ref 30.0–36.0)
MCV: 100 fL (ref 80.0–100.0)
Monocytes Absolute: 0.4 K/uL (ref 0.1–1.0)
Monocytes Relative: 10 %
Neutro Abs: 2.6 K/uL (ref 1.7–7.7)
Neutrophils Relative %: 69 %
Platelets: 163 K/uL (ref 150–400)
RBC: 2.99 MIL/uL — ABNORMAL LOW (ref 3.87–5.11)
RDW: 14.6 % (ref 11.5–15.5)
WBC: 3.7 K/uL — ABNORMAL LOW (ref 4.0–10.5)
nRBC: 0 % (ref 0.0–0.2)

## 2024-06-25 LAB — COMPREHENSIVE METABOLIC PANEL WITH GFR
ALT: 29 U/L (ref 0–44)
AST: 20 U/L (ref 15–41)
Albumin: 4.3 g/dL (ref 3.5–5.0)
Alkaline Phosphatase: 107 U/L (ref 38–126)
Anion gap: 10 (ref 5–15)
BUN: 32 mg/dL — ABNORMAL HIGH (ref 8–23)
CO2: 28 mmol/L (ref 22–32)
Calcium: 9.3 mg/dL (ref 8.9–10.3)
Chloride: 100 mmol/L (ref 98–111)
Creatinine, Ser: 1.64 mg/dL — ABNORMAL HIGH (ref 0.44–1.00)
GFR, Estimated: 32 mL/min — ABNORMAL LOW (ref >60)
Glucose, Bld: 311 mg/dL — ABNORMAL HIGH (ref 70–99)
Potassium: 4.4 mmol/L (ref 3.5–5.1)
Sodium: 138 mmol/L (ref 135–145)
Total Bilirubin: 0.3 mg/dL (ref 0.0–1.2)
Total Protein: 7 g/dL (ref 6.5–8.1)

## 2024-06-25 LAB — MAGNESIUM: Magnesium: 2.2 mg/dL (ref 1.7–2.4)

## 2024-06-25 MED ORDER — ACETAMINOPHEN 325 MG PO TABS
650.0000 mg | ORAL_TABLET | Freq: Once | ORAL | Status: AC
  Administered 2024-06-25: 650 mg via ORAL
  Filled 2024-06-25: qty 2

## 2024-06-25 MED ORDER — SODIUM CHLORIDE 0.9 % IV SOLN
20.0000 mg | Freq: Once | INTRAVENOUS | Status: AC
  Administered 2024-06-25: 20 mg via INTRAVENOUS
  Filled 2024-06-25: qty 20

## 2024-06-25 MED ORDER — INSULIN ASPART 100 UNIT/ML IJ SOLN
10.0000 [IU] | Freq: Once | INTRAMUSCULAR | Status: AC
  Administered 2024-06-25: 10 [IU] via SUBCUTANEOUS
  Filled 2024-06-25: qty 1

## 2024-06-25 MED ORDER — MONTELUKAST SODIUM 10 MG PO TABS
10.0000 mg | ORAL_TABLET | Freq: Once | ORAL | Status: AC
  Administered 2024-06-25: 10 mg via ORAL
  Filled 2024-06-25: qty 1

## 2024-06-25 MED ORDER — SODIUM CHLORIDE 0.9 % IV SOLN: Freq: Once | INTRAVENOUS | Status: AC

## 2024-06-25 MED ORDER — ONDANSETRON HCL 4 MG/2ML IJ SOLN
8.0000 mg | Freq: Once | INTRAMUSCULAR | Status: AC
  Administered 2024-06-25: 8 mg via INTRAVENOUS
  Filled 2024-06-25: qty 4

## 2024-06-25 MED ORDER — SODIUM CHLORIDE 0.9 % IV SOLN: INTRAVENOUS | Status: DC

## 2024-06-25 MED ORDER — DEXTROSE 5 % IV SOLN
58.0000 mg/m2 | Freq: Once | INTRAVENOUS | Status: AC
  Administered 2024-06-25: 120 mg via INTRAVENOUS
  Filled 2024-06-25: qty 60

## 2024-06-25 NOTE — Patient Instructions (Signed)
 [facility] [place] - A DEPT OF [place]  Discharge Instructions: Thank you for choosing [facility] to provide your oncology and hematology care.  If you have a lab appointment with the Cancer Center - please note that after April 8th, 2024, all labs will be drawn in the cancer center.  You do not have to check in or register with the main entrance as you have in the past but will complete your check-in in the cancer center.  Wear comfortable clothing and clothing appropriate for easy access to any Portacath or PICC line.   We strive to give you quality time with your provider. You may need to reschedule your appointment if you arrive late (15 or more minutes).  Arriving late affects you and other patients whose appointments are after yours.  Also, if you miss three or more appointments without notifying the office, you may be dismissed from the clinic at the provider's discretion.      For prescription refill requests, have your pharmacy contact our office and allow 72 hours for refills to be completed.    Today you received the following chemotherapy and/or immunotherapy agents Kyprolis       To help prevent nausea and vomiting after your treatment, we encourage you to take your nausea medication as directed.  BELOW ARE SYMPTOMS THAT SHOULD BE REPORTED IMMEDIATELY: *FEVER GREATER THAN 100.4 F (38 C) OR HIGHER *CHILLS OR SWEATING *NAUSEA AND VOMITING THAT IS NOT CONTROLLED WITH YOUR NAUSEA MEDICATION *UNUSUAL SHORTNESS OF BREATH *UNUSUAL BRUISING OR BLEEDING *URINARY PROBLEMS (pain or burning when urinating, or frequent urination) *BOWEL PROBLEMS (unusual diarrhea, constipation, pain near the anus) TENDERNESS IN MOUTH AND THROAT WITH OR WITHOUT PRESENCE OF ULCERS (sore throat, sores in mouth, or a toothache) UNUSUAL RASH, SWELLING OR PAIN  UNUSUAL VAGINAL DISCHARGE OR ITCHING   Items with * indicate a potential emergency and should be followed up  as soon as possible or go to the Emergency Department if any problems should occur.  Please show the CHEMOTHERAPY ALERT CARD or IMMUNOTHERAPY ALERT CARD at check-in to the Emergency Department and triage nurse.  Should you have questions after your visit or need to cancel or reschedule your appointment, please contact [facility] [place] - A DEPT OF [facility] [phone]  and follow the prompts.  Office hours are 8:00 a.m. to 4:30 p.m. Monday - Friday. Please note that voicemails left after 4:00 p.m. may not be returned until the following business day.  We are closed weekends and major holidays. You have access to a nurse at all times for urgent questions. Please call the main number to the clinic [phone] and follow the prompts.  For any non-urgent questions, you may also contact your provider using MyChart. We now offer e-Visits for anyone 25 and older to request care online for non-urgent symptoms. For details visit [email or web address].   Also download the MyChart app! Go to the app store, search MyChart, open the app, select [place], and log in with your MyChart username and password.

## 2024-06-25 NOTE — Progress Notes (Signed)
 Patient presents today for Kyprolis  infusion per providers order.  Vital signs and labs reviewed, creatinine noted to be 1.64, MD notified,  Message received from Isaiah Piety RN/Dr. Davonna okay to proceed with treatment.  Patient will also receive 10 Units of Novolog  per providers standing order for blood glucose >300.  Treatment given today per MD orders.  Stable during infusion without adverse affects.  Vital signs stable.  No complaints at this time.  Discharge from clinic ambulatory in stable condition.  Alert and oriented X 3.  Follow up with [facility] as scheduled.

## 2024-06-29 ENCOUNTER — Telehealth: Payer: Self-pay | Admitting: Gastroenterology

## 2024-06-29 ENCOUNTER — Ambulatory Visit (INDEPENDENT_AMBULATORY_CARE_PROVIDER_SITE_OTHER): Admitting: Gastroenterology

## 2024-06-29 ENCOUNTER — Encounter: Payer: Self-pay | Admitting: Gastroenterology

## 2024-06-29 VITALS — BP 128/79 | HR 69 | Temp 98.4°F | Ht 65.0 in | Wt 192.2 lb

## 2024-06-29 DIAGNOSIS — R1310 Dysphagia, unspecified: Secondary | ICD-10-CM | POA: Insufficient documentation

## 2024-06-29 DIAGNOSIS — R948 Abnormal results of function studies of other organs and systems: Secondary | ICD-10-CM | POA: Insufficient documentation

## 2024-06-29 DIAGNOSIS — R1319 Other dysphagia: Secondary | ICD-10-CM | POA: Diagnosis not present

## 2024-06-29 NOTE — H&P (View-Only) (Signed)
 GI Office Note    Referring Provider: Katrinka Aquas, MD Primary Care Physician:  Katrinka Aquas, MD  Primary Gastroenterologist: Carlin POUR. Cindie, DO  Chief Complaint   Chief Complaint  Patient presents with   Follow-up    Pt here to schedule colonoscopy    History of Present Illness   Alexandra Price is a 78 y.o. female presenting today to schedule colonoscopy at the request of her oncologist. She is seen locally by Dr. Davonna, for Multiple Myeloma (not having achieved remission), seen by Dr. Arman at [facility] working towards CAR-T therapy. She is on Eliquis  for history of PE 2021 (study completed at [facility]).   She had PET/CT whole body study on 06/02/24 which showed focal FDG uptake in the transverse colon, may reflect a polyp. Recommended colonoscopy. Due to upcoming CAR-T we were requested to expedite colonoscopy.   She is currently on daratumumab , carflizomib, dexamethasone  bridge therapy to CAR-T. She is scheduled for leukapheresis on 12/1.     Discussed the use of AI scribe software for clinical note transcription with the patient, who gave verbal consent to proceed.  History of Present Illness   Alexandra Price is a 78 year old female who presents for evaluation of abnormal transverse colon on recent PET. She is in preparation for CAR-T for Multiple Myeloma.     She experiences frequent diarrhea, with bowel movements exceeding five times daily, often watery, and classified as type six or seven on the [facility] Stool Chart. This has persisted for several months, coinciding with the initiation of weekly Kyprolis  infusions, increased from bi-weekly about a month ago. No blood in stool is noted. She experiences occasional nausea, managed with medication.  She has a history of swallowing difficulties, with liquids sometimes regurgitating through her nose, and difficulty swallowing food and medication. This issue has been ongoing, though nasal regurgitation has  not occurred recently. At times she feels food/medication stick in the back of her throat. Tries to wash down with liquid. Some heartburn. No abdominal pain.    She has been off Ozempic for three to four weeks, initially prescribed for weight loss. Currently off in preparation for CAR-T.   Prior Data   EGD 05/2022: -small hh -gastritis s/p bx but no h.pylori  Colonoscopy 12/2021: -nonbleeding internal hemorrhoids -diverticulosis sigmoid colon -one 11 mm polyp sigmoid colon, tubulovillous adenoma -one 5 mm polyps descending colon -repeat colonoscopy 3 years  Medications   Current Outpatient Medications  Medication Sig Dispense Refill   acetaminophen  (TYLENOL ) 325 MG tablet Take 650 mg by mouth as needed for moderate pain (prior to infusion).     acyclovir  (ZOVIRAX ) 400 MG tablet Take 1 tablet (400 mg total) by mouth 2 (two) times daily. 60 tablet 6   ALPRAZolam  (XANAX ) 0.25 MG tablet Take one tablet (0.25 mg) by mouth in the morning as needed, and three tablets (0.75 mg) at bedtime as needed 120 tablet 2   Calcium  Carb-Cholecalciferol  (CALCIUM  1000 + D PO) Take 1,000 mg by mouth daily.     ciclopirox (PENLAC) 8 % solution Apply topically daily.     dexamethasone  (DECADRON ) 4 MG tablet Take 10 tablets (40 mg) once on day 22 starting with cycles 3-4, then 5 tablets (20 mg) once on day 22 cycles 5-8. 10 tablet 3   dexAMETHasone  20 MG TABS Take 1 tablet by mouth as directed. Take 1 tablet 30 minutes prior to infusions 30 tablet 2   diclofenac  Sodium (VOLTAREN ) 1 %  GEL 4 gram every 8 hours as needed. 100 g 11   diphenhydrAMINE  (BENADRYL ) 25 MG tablet Take 50 mg by mouth as needed (prior to infusion).     ELIQUIS  2.5 MG TABS tablet Take 2.5 mg by mouth 2 (two) times daily.     escitalopram  (LEXAPRO ) 20 MG tablet Take 1 tablet by mouth once daily 30 tablet 0   furosemide  (LASIX ) 20 MG tablet Take 1 tablet (20 mg total) by mouth as needed (in am as needed for swelling). 30 tablet 2    gabapentin  (NEURONTIN ) 300 MG capsule Take 1 capsule (300 mg total) by mouth 3 (three) times daily. 90 capsule 0   glipiZIDE  (GLUCOTROL ) 5 MG tablet Take 1 tablet (5 mg total) by mouth daily before breakfast. 90 tablet 0   HYDROcodone -acetaminophen  (NORCO) 10-325 MG tablet Take 1 tablet by mouth every 4 (four) hours as needed. 180 tablet 0   ketoconazole (NIZORAL) 2 % cream Apply topically 2 (two) times daily.     lidocaine -prilocaine  (EMLA ) cream Apply 1 Application topically as needed. 1 squeeze every 8 hours 30 g 11   midodrine  (PROAMATINE ) 2.5 MG tablet Take 1 tablet (2.5 mg total) by mouth 3 (three) times daily with meals. 90 tablet 1   Multiple Vitamin (MULTI VITAMIN) TABS Take 1 tablet by mouth daily.     ondansetron  (ZOFRAN ) 8 MG tablet Take 1 tablet (8 mg total) by mouth every 8 (eight) hours as needed for nausea or vomiting. 30 tablet 1   OZEMPIC, 0.25 OR 0.5 MG/DOSE, 2 MG/3ML SOPN INJECT 0.25 MG INTO THE SKIN ONCE A WEEK FOR 4 WEEKS, THEN 0.5 MG ONCE A WEEK.     pantoprazole  (PROTONIX ) 40 MG tablet Take 1 tablet (40 mg total) by mouth daily. 90 tablet 1   polyethylene glycol (MIRALAX  / GLYCOLAX ) 17 g packet Take 17 g by mouth daily as needed for moderate constipation. 14 each 0   prochlorperazine  (COMPAZINE ) 10 MG tablet Take 1 tablet (10 mg total) by mouth every 6 (six) hours as needed for nausea or vomiting. 30 tablet 0   prochlorperazine  (COMPAZINE ) 10 MG tablet Take 1 tablet (10 mg total) by mouth every 6 (six) hours as needed for nausea or vomiting. 30 tablet 1   rosuvastatin  (CRESTOR ) 10 MG tablet Take 10 mg by mouth daily.     zolpidem  (AMBIEN ) 10 MG tablet Take 1 tablet (10 mg total) by mouth at bedtime as needed. 30 tablet 5   acyclovir  (ZOVIRAX ) 400 MG tablet Take 1 tablet (400 mg total) by mouth daily. (Patient not taking: Reported on 06/29/2024) 30 tablet 3   No current facility-administered medications for this visit.    Allergies   Allergies as of 06/29/2024 - Review  Complete 06/29/2024  Allergen Reaction Noted   Ciprofloxacin  Anaphylaxis 06/04/2018   Amoxicillin  Other (See Comments) 04/07/2019   Morphine and codeine Nausea And Vomiting 04/10/2018   Augmentin [amoxicillin -pot clavulanate] Other (See Comments)     Past Medical History   Past Medical History:  Diagnosis Date   Anxiety    Chronic kidney disease    Depression    Hypertension    Multiple myeloma (HCC)    multiple myeloma   Pre-diabetes     Past Surgical History   Past Surgical History:  Procedure Laterality Date   ABDOMINAL HYSTERECTOMY     total   APPENDECTOMY     BALLOON DILATION N/A 05/28/2022   Procedure: BALLOON DILATION;  Surgeon: Cindie Carlin POUR, DO;  [facility]  [facility];  Service: Endoscopy;  Laterality: N/A;   BIOPSY  05/28/2022   Procedure: BIOPSY;  Surgeon: Cindie Carlin POUR, DO;  [facility];  Service: Endoscopy;;   COLONOSCOPY WITH PROPOFOL  N/A 12/25/2021   Procedure: COLONOSCOPY WITH PROPOFOL ;  Surgeon: Cindie Carlin POUR, DO;  [facility];  Service: Endoscopy;  Laterality: N/A;  2:30pm   ESOPHAGOGASTRODUODENOSCOPY (EGD) WITH PROPOFOL  N/A 05/28/2022   Procedure: ESOPHAGOGASTRODUODENOSCOPY (EGD) WITH PROPOFOL ;  Surgeon: Cindie Carlin POUR, DO;  [facility]: [facility];  Service: Endoscopy;  Laterality: N/A;  10:00am, asa 3   LAPAROSCOPIC APPENDECTOMY N/A 05/20/2018   Procedure: APPENDECTOMY LAPAROSCOPIC;  Surgeon: Mavis Anes, MD;  Location: [facility];  Service: General;  Laterality: N/A;   POLYPECTOMY  12/25/2021   Procedure: POLYPECTOMY;  Surgeon: Cindie Carlin POUR, DO;  [facility];  Service: Endoscopy;;   PORTACATH PLACEMENT Right 04/14/2018   Procedure: INSERTION PORT-A-CATH;  Surgeon: Mavis Anes, MD;  Location: [facility];  Service: General;  Laterality: Right;    Past Family History   Family History  Problem Relation Age of Onset   Heart disease Mother    Emphysema Father    Diabetes Sister    Depression Sister     Cancer Brother        liver, lung, and colon. colon cancer at age 32.   Diabetes Brother     Past Social History   Social History   Socioeconomic History   Marital status: Divorced    Spouse name: Not on file   Number of children: 6   Years of education: Not on file   Highest education level: Not on file  Occupational History    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  Tobacco Use   Smoking status: Never   Smokeless tobacco: Never  Vaping Use   Vaping status: Never Used  Substance and Sexual Activity   Alcohol use: Never   Drug use: Not Currently   Sexual activity: Not Currently  Other Topics Concern   Not on file  Social History Narrative   Not on file   Social Drivers of Health   Financial Resource Strain: Low Risk  (07/06/2020)   Overall Financial Resource Strain (CARDIA)    Difficulty of Paying Living Expenses: Not hard at all  Food Insecurity: No Food Insecurity (09/17/2022)   Hunger Vital Sign    Worried About Running Out of Food in the Last Year: Never true    Ran Out of Food in the Last Year: Never true  Transportation Needs: No Transportation Needs (09/17/2022)   PRAPARE - Administrator, Civil Service (Medical): No    Lack of Transportation (Non-Medical): No  Physical Activity: Inactive (07/06/2020)   Exercise Vital Sign    Days of Exercise per Week: 0 days    Minutes of Exercise per Session: 0 min  Stress: No Stress Concern Present (07/06/2020)   [employer] of Occupational Health - Occupational Stress Questionnaire    Feeling of Stress : Not at all  Social Connections: Moderately Isolated (07/06/2020)   Social Connection and Isolation Panel    Frequency of Communication with Friends and Family: More than three times a week    Frequency of Social Gatherings with Friends and Family: Twice a week    Attends Religious Services: 1 to 4 times per year    Active Member of [employer] or Organizations: No    Attends Banker Meetings: Never     Marital Status: Separated  Intimate Partner Violence: Not At Risk (09/17/2022)   Humiliation, Afraid, Rape, and Kick questionnaire    Fear of Current or Ex-Partner: No    Emotionally Abused: No    Physically Abused: No    Sexually Abused: No    Review of Systems   General: Negative for anorexia, weight loss, fever, chills, fatigue, weakness. Eyes: Negative for vision changes.  ENT: Negative for hoarseness, difficulty swallowing , nasal congestion. CV: Negative for chest pain, angina, palpitations, dyspnea on exertion, peripheral edema.  Respiratory: Negative for dyspnea at rest, dyspnea on exertion, cough, sputum, wheezing.  GI: See history of present illness. GU:  Negative for dysuria, hematuria, urinary incontinence, urinary frequency, nocturnal urination.  MS:+for joint pain,  back pain.  Derm: Negative for rash or itching.  Neuro: Negative for weakness, abnormal sensation, seizure, frequent headaches, memory loss,  confusion.  Psych: Negative for  depression, suicidal ideation, hallucinations. +anxiety Endo: Negative for unusual weight change.  Heme: Negative for bruising or bleeding. Allergy: Negative for rash or hives.  Physical Exam   BP 128/79   Pulse 69   Temp 98.4 F (36.9 C)   Ht 5' 5 (1.651 m)   Wt 192 lb 3.2 oz (87.2 kg)   BMI 31.98 kg/m    General: Well-nourished, well-developed in no acute distress.  Head: Normocephalic, atraumatic.   Eyes: Conjunctiva pink, no icterus. Mouth: Oropharyngeal mucosa moist and pink  Neck: Supple without thyromegaly, masses, or lymphadenopathy.  Lungs: Clear to auscultation bilaterally.  Heart: Regular rate and rhythm, no murmurs rubs or gallops.  Abdomen: Bowel sounds are normal, nontender, nondistended, no hepatosplenomegaly or masses,  no abdominal bruits or hernia, no rebound or guarding.   Rectal: not performed Extremities: No lower extremity edema. No clubbing or deformities.  Neuro: Alert and oriented x 4 ,  grossly normal neurologically.  Skin: Warm and dry, no rash or jaundice.   Psych: Alert and cooperative, normal mood and affect.  Labs   Lab Results  Component Value Date   NA 138 06/25/2024   CL 100 06/25/2024   K 4.4 06/25/2024   CO2 28 06/25/2024   BUN 32 (H) 06/25/2024   CREATININE 1.64 (H) 06/25/2024   GFRNONAA 32 (L) 06/25/2024   CALCIUM  9.3 06/25/2024   PHOS 2.8 08/17/2022   ALBUMIN  4.3 06/25/2024   GLUCOSE 311 (H) 06/25/2024   Lab Results  Component Value Date   ALT 29 06/25/2024   AST 20 06/25/2024   ALKPHOS 107 06/25/2024   BILITOT 0.3 06/25/2024   Lab Results  Component Value Date   WBC 3.7 (L) 06/25/2024   HGB 9.4 (L) 06/25/2024   HCT 29.9 (L) 06/25/2024   MCV 100.0 06/25/2024   PLT 163 06/25/2024    Imaging Studies   DG Shoulder Left Result Date: 06/16/2024 EXAM: 1 VIEW XRAY OF THE LEFT SHOULDER 06/15/2024 12:17:00 PM COMPARISON: 12/08/2018 CLINICAL HISTORY: FINDINGS: BONES AND JOINTS: Glenohumeral joint is normally aligned. No acute fracture or dislocation. Multiple small lucencies are seen in the proximal left humerus, most likely related to multiple myeloma. A stable expansile lytic lesion is seen in the distal left clavicle, also consistent with multiple myeloma. SOFT TISSUES: No abnormal calcifications. Visualized lung is unremarkable. IMPRESSION: 1. Grossly stable multiple small lucencies in the proximal left humerus, consistent with multiple myeloma. 2. Grossly stable expansile lytic lesion in the distal left clavicle, consistent with multiple myeloma. Electronically signed by: Lynwood Seip MD 06/16/2024 08:19 AM EDT RP Workstation: [id number]    Assessment/Plan:  Asa 3, rm 1,2 Hold ozempic 7 days Hold eliquis  48 hrs, approval needed Glipizide  hold day of  Check why eliquis   r tcs/egd/eg  Colonoscopy for abnormal PET scan of transverse colon: -recent abnormality seen of transverse colon on PET, possibly polyp. Oncology requesting  colonoscopy prior to CAR-T treatment as this will significantly delay work up. -will discuss timing of colonscopy with oncology -will need to get approval to hold eliquis  48 hours before procedure.  -hold ozempic 7 days before (she reports off for several weeks pending CAR-T).  -ASA 3, Rm 1,2 ok.  I have discussed the risks, alternatives, benefits with regards to but not limited to the risk of reaction to medication, bleeding, infection, perforation and the patient is agreeable to proceed. Written consent to be obtained.   Chronic diarrhea with loose stools more than five times a day, she reports due to weekly Kyprolis  infusions. No blood in stool.    Dysphagia evaluation Intermittent dysphagia with episodes of liquid regurgitation through the nose. Her biggest concern is of medication/solid food dysphagia, sticks in throat and has to wash down. No esophageal stricture in 2023. Cannot exclude stricture, esophageal dysmotility, oropharyngeal component. We discussed possible EGD/ED vs BPE but she wanted to pursue EGD at time of colonoscopy given upcoming CAR-T, and she will be off anticoagulation.  -EGD with possible esophageal dilation. ASA 3. Rm 1,2.  I have discussed the risks, alternatives, benefits with regards to but not limited to the risk of reaction to medication, bleeding, infection, perforation and the patient is agreeable to proceed. Written consent to be obtained. - Advised on dietary modifications to prevent choking, such as chewing thoroughly and avoiding raw vegetables and dry meats.      Sonny RAMAN. Ezzard, MHS, [name] [facility]

## 2024-06-29 NOTE — Progress Notes (Signed)
 GI Office Note    Referring Provider: Katrinka Aquas, MD Primary Care Physician:  Katrinka Aquas, MD  Primary Gastroenterologist: Carlin POUR. Cindie, DO  Chief Complaint   Chief Complaint  Patient presents with   Follow-up    Pt here to schedule colonoscopy    History of Present Illness   Alexandra Price is a 78 y.o. female presenting today to schedule colonoscopy at the request of her oncologist. She is seen locally by Dr. Davonna, for Multiple Myeloma (not having achieved remission), seen by Dr. Arman at [facility] working towards CAR-T therapy. She is on Eliquis  for history of PE 2021 (study completed at [facility]).   She had PET/CT whole body study on 06/02/24 which showed focal FDG uptake in the transverse colon, may reflect a polyp. Recommended colonoscopy. Due to upcoming CAR-T we were requested to expedite colonoscopy.   She is currently on daratumumab , carflizomib, dexamethasone  bridge therapy to CAR-T. She is scheduled for leukapheresis on 12/1.     Discussed the use of AI scribe software for clinical note transcription with the patient, who gave verbal consent to proceed.  History of Present Illness   Alexandra Price is a 78 year old female who presents for evaluation of abnormal transverse colon on recent PET. She is in preparation for CAR-T for Multiple Myeloma.     She experiences frequent diarrhea, with bowel movements exceeding five times daily, often watery, and classified as type six or seven on the [facility] Stool Chart. This has persisted for several months, coinciding with the initiation of weekly Kyprolis  infusions, increased from bi-weekly about a month ago. No blood in stool is noted. She experiences occasional nausea, managed with medication.  She has a history of swallowing difficulties, with liquids sometimes regurgitating through her nose, and difficulty swallowing food and medication. This issue has been ongoing, though nasal regurgitation has  not occurred recently. At times she feels food/medication stick in the back of her throat. Tries to wash down with liquid. Some heartburn. No abdominal pain.    She has been off Ozempic for three to four weeks, initially prescribed for weight loss. Currently off in preparation for CAR-T.   Prior Data   EGD 05/2022: -small hh -gastritis s/p bx but no h.pylori  Colonoscopy 12/2021: -nonbleeding internal hemorrhoids -diverticulosis sigmoid colon -one 11 mm polyp sigmoid colon, tubulovillous adenoma -one 5 mm polyps descending colon -repeat colonoscopy 3 years  Medications   Current Outpatient Medications  Medication Sig Dispense Refill   acetaminophen  (TYLENOL ) 325 MG tablet Take 650 mg by mouth as needed for moderate pain (prior to infusion).     acyclovir  (ZOVIRAX ) 400 MG tablet Take 1 tablet (400 mg total) by mouth 2 (two) times daily. 60 tablet 6   ALPRAZolam  (XANAX ) 0.25 MG tablet Take one tablet (0.25 mg) by mouth in the morning as needed, and three tablets (0.75 mg) at bedtime as needed 120 tablet 2   Calcium  Carb-Cholecalciferol  (CALCIUM  1000 + D PO) Take 1,000 mg by mouth daily.     ciclopirox (PENLAC) 8 % solution Apply topically daily.     dexamethasone  (DECADRON ) 4 MG tablet Take 10 tablets (40 mg) once on day 22 starting with cycles 3-4, then 5 tablets (20 mg) once on day 22 cycles 5-8. 10 tablet 3   dexAMETHasone  20 MG TABS Take 1 tablet by mouth as directed. Take 1 tablet 30 minutes prior to infusions 30 tablet 2   diclofenac  Sodium (VOLTAREN ) 1 %  GEL 4 gram every 8 hours as needed. 100 g 11   diphenhydrAMINE  (BENADRYL ) 25 MG tablet Take 50 mg by mouth as needed (prior to infusion).     ELIQUIS  2.5 MG TABS tablet Take 2.5 mg by mouth 2 (two) times daily.     escitalopram  (LEXAPRO ) 20 MG tablet Take 1 tablet by mouth once daily 30 tablet 0   furosemide  (LASIX ) 20 MG tablet Take 1 tablet (20 mg total) by mouth as needed (in am as needed for swelling). 30 tablet 2    gabapentin  (NEURONTIN ) 300 MG capsule Take 1 capsule (300 mg total) by mouth 3 (three) times daily. 90 capsule 0   glipiZIDE  (GLUCOTROL ) 5 MG tablet Take 1 tablet (5 mg total) by mouth daily before breakfast. 90 tablet 0   HYDROcodone -acetaminophen  (NORCO) 10-325 MG tablet Take 1 tablet by mouth every 4 (four) hours as needed. 180 tablet 0   ketoconazole (NIZORAL) 2 % cream Apply topically 2 (two) times daily.     lidocaine -prilocaine  (EMLA ) cream Apply 1 Application topically as needed. 1 squeeze every 8 hours 30 g 11   midodrine  (PROAMATINE ) 2.5 MG tablet Take 1 tablet (2.5 mg total) by mouth 3 (three) times daily with meals. 90 tablet 1   Multiple Vitamin (MULTI VITAMIN) TABS Take 1 tablet by mouth daily.     ondansetron  (ZOFRAN ) 8 MG tablet Take 1 tablet (8 mg total) by mouth every 8 (eight) hours as needed for nausea or vomiting. 30 tablet 1   OZEMPIC, 0.25 OR 0.5 MG/DOSE, 2 MG/3ML SOPN INJECT 0.25 MG INTO THE SKIN ONCE A WEEK FOR 4 WEEKS, THEN 0.5 MG ONCE A WEEK.     pantoprazole  (PROTONIX ) 40 MG tablet Take 1 tablet (40 mg total) by mouth daily. 90 tablet 1   polyethylene glycol (MIRALAX  / GLYCOLAX ) 17 g packet Take 17 g by mouth daily as needed for moderate constipation. 14 each 0   prochlorperazine  (COMPAZINE ) 10 MG tablet Take 1 tablet (10 mg total) by mouth every 6 (six) hours as needed for nausea or vomiting. 30 tablet 0   prochlorperazine  (COMPAZINE ) 10 MG tablet Take 1 tablet (10 mg total) by mouth every 6 (six) hours as needed for nausea or vomiting. 30 tablet 1   rosuvastatin  (CRESTOR ) 10 MG tablet Take 10 mg by mouth daily.     zolpidem  (AMBIEN ) 10 MG tablet Take 1 tablet (10 mg total) by mouth at bedtime as needed. 30 tablet 5   acyclovir  (ZOVIRAX ) 400 MG tablet Take 1 tablet (400 mg total) by mouth daily. (Patient not taking: Reported on 06/29/2024) 30 tablet 3   No current facility-administered medications for this visit.    Allergies   Allergies as of 06/29/2024 - Review  Complete 06/29/2024  Allergen Reaction Noted   Ciprofloxacin  Anaphylaxis 06/04/2018   Amoxicillin  Other (See Comments) 04/07/2019   Morphine and codeine Nausea And Vomiting 04/10/2018   Augmentin [amoxicillin -pot clavulanate] Other (See Comments)     Past Medical History   Past Medical History:  Diagnosis Date   Anxiety    Chronic kidney disease    Depression    Hypertension    Multiple myeloma (HCC)    multiple myeloma   Pre-diabetes     Past Surgical History   Past Surgical History:  Procedure Laterality Date   ABDOMINAL HYSTERECTOMY     total   APPENDECTOMY     BALLOON DILATION N/A 05/28/2022   Procedure: BALLOON DILATION;  Surgeon: Cindie Carlin POUR, DO;  [facility]:  [facility];  Service: Endoscopy;  Laterality: N/A;   BIOPSY  05/28/2022   Procedure: BIOPSY;  Surgeon: Cindie Carlin POUR, DO;  Location: [facility];  Service: Endoscopy;;   COLONOSCOPY WITH PROPOFOL  N/A 12/25/2021   Procedure: COLONOSCOPY WITH PROPOFOL ;  Surgeon: Cindie Carlin POUR, DO;  Location: [facility];  Service: Endoscopy;  Laterality: N/A;  2:30pm   ESOPHAGOGASTRODUODENOSCOPY (EGD) WITH PROPOFOL  N/A 05/28/2022   Procedure: ESOPHAGOGASTRODUODENOSCOPY (EGD) WITH PROPOFOL ;  Surgeon: Cindie Carlin POUR, DO;  [facility]: [facility];  Service: Endoscopy;  Laterality: N/A;  10:00am, asa 3   LAPAROSCOPIC APPENDECTOMY N/A 05/20/2018   Procedure: APPENDECTOMY LAPAROSCOPIC;  Surgeon: Mavis Anes, MD;  Location: [facility];  Service: General;  Laterality: N/A;   POLYPECTOMY  12/25/2021   Procedure: POLYPECTOMY;  Surgeon: Cindie Carlin POUR, DO;  [facility];  Service: Endoscopy;;   PORTACATH PLACEMENT Right 04/14/2018   Procedure: INSERTION PORT-A-CATH;  Surgeon: Mavis Anes, MD;  Location: [facility];  Service: General;  Laterality: Right;    Past Family History   Family History  Problem Relation Age of Onset   Heart disease Mother    Emphysema Father    Diabetes Sister    Depression Sister     Cancer Brother        liver, lung, and colon. colon cancer at age 32.   Diabetes Brother     Past Social History   Social History   Socioeconomic History   Marital status: Divorced    Spouse name: Not on file   Number of children: 6   Years of education: Not on file   Highest education level: Not on file  Occupational History    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  Tobacco Use   Smoking status: Never   Smokeless tobacco: Never  Vaping Use   Vaping status: Never Used  Substance and Sexual Activity   Alcohol use: Never   Drug use: Not Currently   Sexual activity: Not Currently  Other Topics Concern   Not on file  Social History Narrative   Not on file   Social Drivers of Health   Financial Resource Strain: Low Risk  (07/06/2020)   Overall Financial Resource Strain (CARDIA)    Difficulty of Paying Living Expenses: Not hard at all  Food Insecurity: No Food Insecurity (09/17/2022)   Hunger Vital Sign    Worried About Running Out of Food in the Last Year: Never true    Ran Out of Food in the Last Year: Never true  Transportation Needs: No Transportation Needs (09/17/2022)   PRAPARE - Administrator, Civil Service (Medical): No    Lack of Transportation (Non-Medical): No  Physical Activity: Inactive (07/06/2020)   Exercise Vital Sign    Days of Exercise per Week: 0 days    Minutes of Exercise per Session: 0 min  Stress: No Stress Concern Present (07/06/2020)   [employer] of Occupational Health - Occupational Stress Questionnaire    Feeling of Stress : Not at all  Social Connections: Moderately Isolated (07/06/2020)   Social Connection and Isolation Panel    Frequency of Communication with Friends and Family: More than three times a week    Frequency of Social Gatherings with Friends and Family: Twice a week    Attends Religious Services: 1 to 4 times per year    Active Member of [employer] or Organizations: No    Attends Banker Meetings: Never     Marital Status: Separated  Intimate Partner Violence: Not At Risk (09/17/2022)   Humiliation, Afraid, Rape, and Kick questionnaire    Fear of Current or Ex-Partner: No    Emotionally Abused: No    Physically Abused: No    Sexually Abused: No    Review of Systems   General: Negative for anorexia, weight loss, fever, chills, fatigue, weakness. Eyes: Negative for vision changes.  ENT: Negative for hoarseness, difficulty swallowing , nasal congestion. CV: Negative for chest pain, angina, palpitations, dyspnea on exertion, peripheral edema.  Respiratory: Negative for dyspnea at rest, dyspnea on exertion, cough, sputum, wheezing.  GI: See history of present illness. GU:  Negative for dysuria, hematuria, urinary incontinence, urinary frequency, nocturnal urination.  MS:+for joint pain,  back pain.  Derm: Negative for rash or itching.  Neuro: Negative for weakness, abnormal sensation, seizure, frequent headaches, memory loss,  confusion.  Psych: Negative for  depression, suicidal ideation, hallucinations. +anxiety Endo: Negative for unusual weight change.  Heme: Negative for bruising or bleeding. Allergy: Negative for rash or hives.  Physical Exam   BP 128/79   Pulse 69   Temp 98.4 F (36.9 C)   Ht 5' 5 (1.651 m)   Wt 192 lb 3.2 oz (87.2 kg)   BMI 31.98 kg/m    General: Well-nourished, well-developed in no acute distress.  Head: Normocephalic, atraumatic.   Eyes: Conjunctiva pink, no icterus. Mouth: Oropharyngeal mucosa moist and pink  Neck: Supple without thyromegaly, masses, or lymphadenopathy.  Lungs: Clear to auscultation bilaterally.  Heart: Regular rate and rhythm, no murmurs rubs or gallops.  Abdomen: Bowel sounds are normal, nontender, nondistended, no hepatosplenomegaly or masses,  no abdominal bruits or hernia, no rebound or guarding.   Rectal: not performed Extremities: No lower extremity edema. No clubbing or deformities.  Neuro: Alert and oriented x 4 ,  grossly normal neurologically.  Skin: Warm and dry, no rash or jaundice.   Psych: Alert and cooperative, normal mood and affect.  Labs   Lab Results  Component Value Date   NA 138 06/25/2024   CL 100 06/25/2024   K 4.4 06/25/2024   CO2 28 06/25/2024   BUN 32 (H) 06/25/2024   CREATININE 1.64 (H) 06/25/2024   GFRNONAA 32 (L) 06/25/2024   CALCIUM  9.3 06/25/2024   PHOS 2.8 08/17/2022   ALBUMIN  4.3 06/25/2024   GLUCOSE 311 (H) 06/25/2024   Lab Results  Component Value Date   ALT 29 06/25/2024   AST 20 06/25/2024   ALKPHOS 107 06/25/2024   BILITOT 0.3 06/25/2024   Lab Results  Component Value Date   WBC 3.7 (L) 06/25/2024   HGB 9.4 (L) 06/25/2024   HCT 29.9 (L) 06/25/2024   MCV 100.0 06/25/2024   PLT 163 06/25/2024    Imaging Studies   DG Shoulder Left Result Date: 06/16/2024 EXAM: 1 VIEW XRAY OF THE LEFT SHOULDER 06/15/2024 12:17:00 PM COMPARISON: 12/08/2018 CLINICAL HISTORY: FINDINGS: BONES AND JOINTS: Glenohumeral joint is normally aligned. No acute fracture or dislocation. Multiple small lucencies are seen in the proximal left humerus, most likely related to multiple myeloma. A stable expansile lytic lesion is seen in the distal left clavicle, also consistent with multiple myeloma. SOFT TISSUES: No abnormal calcifications. Visualized lung is unremarkable. IMPRESSION: 1. Grossly stable multiple small lucencies in the proximal left humerus, consistent with multiple myeloma. 2. Grossly stable expansile lytic lesion in the distal left clavicle, consistent with multiple myeloma. Electronically signed by: Lynwood Seip MD 06/16/2024 08:19 AM EDT RP Workstation: [id number]    Assessment/Plan:  Asa 3, rm 1,2 Hold ozempic 7 days Hold eliquis  48 hrs, approval needed Glipizide  hold day of  Check why eliquis   r tcs/egd/eg  Colonoscopy for abnormal PET scan of transverse colon: -recent abnormality seen of transverse colon on PET, possibly polyp. Oncology requesting  colonoscopy prior to CAR-T treatment as this will significantly delay work up. -will discuss timing of colonscopy with oncology -will need to get approval to hold eliquis  48 hours before procedure.  -hold ozempic 7 days before (she reports off for several weeks pending CAR-T).  -ASA 3, Rm 1,2 ok.  I have discussed the risks, alternatives, benefits with regards to but not limited to the risk of reaction to medication, bleeding, infection, perforation and the patient is agreeable to proceed. Written consent to be obtained.   Chronic diarrhea with loose stools more than five times a day, she reports due to weekly Kyprolis  infusions. No blood in stool.    Dysphagia evaluation Intermittent dysphagia with episodes of liquid regurgitation through the nose. Her biggest concern is of medication/solid food dysphagia, sticks in throat and has to wash down. No esophageal stricture in 2023. Cannot exclude stricture, esophageal dysmotility, oropharyngeal component. We discussed possible EGD/ED vs BPE but she wanted to pursue EGD at time of colonoscopy given upcoming CAR-T, and she will be off anticoagulation.  -EGD with possible esophageal dilation. ASA 3. Rm 1,2.  I have discussed the risks, alternatives, benefits with regards to but not limited to the risk of reaction to medication, bleeding, infection, perforation and the patient is agreeable to proceed. Written consent to be obtained. - Advised on dietary modifications to prevent choking, such as chewing thoroughly and avoiding raw vegetables and dry meats.      Sonny RAMAN. Ezzard, MHS, [name] [facility]

## 2024-06-29 NOTE — Patient Instructions (Signed)
 We will be in touch soon to schedule colonoscopy and upper endoscopy once we get permission to hold Eliquis  for 48 hours.   In the meantime, please chew your food thoroughly. Avoid raw vegetables for now. You can eat cooked vegetables. Stay away from tough/dry meats.

## 2024-06-29 NOTE — Telephone Encounter (Signed)
 We need to request holding eliquis  48 hours before upcoming colonoscopy/egd/ed. Reach out to local oncologist, Dr. Davonna and inquire.   We also need to know if there is a certain time we should complete colonoscopy/egd/ed/ given her ongoing treatments (bridging to CAR-T). Try reaching out to Eleanor Board RN [phone] [facility] stem cell transplant and cellular therapy coordinator. She should be able to find out.   We need to move asap on this to get these procedures in before CAR-T starts in January.

## 2024-06-30 MED ORDER — SUFLAVE 178.7 G PO SOLR: 1.0000 | ORAL | Status: DC

## 2024-06-30 NOTE — Telephone Encounter (Signed)
 Noted

## 2024-06-30 NOTE — Addendum Note (Signed)
 Addended by: JEANELL GRAEME RAMAN on: 06/30/2024 09:59 AM   Modules accepted: Orders

## 2024-06-30 NOTE — Telephone Encounter (Addendum)
 Spoke with Melissa and as long as patient has done before 12/1 ideally.   Called pt and she has been scheduled for 11/18. She is not currently on her ozempic. Aware when she needs to start holding her eliquis . She will come by the office Thursday to pick up prep sample and instructions.        Medication Samples have been provided to the patient. Drug name: suflav       Strength: 178.7g/7.3g/1.12g/0.9g/0.5g        Qty: 1 box  LOT: [id number]  Exp.Date: 12/17/2025 Dosing instructions: as directed for colonoscopy instructions  The patient has been instructed regarding the correct time, dose, and frequency of taking this medication, including desired effects and most common side effects.

## 2024-07-01 ENCOUNTER — Telehealth: Payer: Self-pay | Admitting: Neurology

## 2024-07-01 NOTE — Telephone Encounter (Signed)
 Eleanor Board (RN) From [facility] called to speak to [facility] about getting Pt seen sooner . Pt is scheduled to have Cartesi Therapy and this Therapy  and treatment may cause some Neurological toxicity  and they would like Pt to be  seen sooner   Melissa callback # [phone]  Eleanor also stated  you can call  Pt as well to schedule

## 2024-07-02 ENCOUNTER — Inpatient Hospital Stay (HOSPITAL_BASED_OUTPATIENT_CLINIC_OR_DEPARTMENT_OTHER): Admitting: Oncology

## 2024-07-02 ENCOUNTER — Inpatient Hospital Stay

## 2024-07-02 ENCOUNTER — Inpatient Hospital Stay: Admitting: Oncology

## 2024-07-02 VITALS — BP 133/71 | HR 80 | Temp 96.5°F | Resp 16

## 2024-07-02 DIAGNOSIS — I2782 Chronic pulmonary embolism: Secondary | ICD-10-CM

## 2024-07-02 DIAGNOSIS — C9000 Multiple myeloma not having achieved remission: Secondary | ICD-10-CM | POA: Diagnosis not present

## 2024-07-02 DIAGNOSIS — F419 Anxiety disorder, unspecified: Secondary | ICD-10-CM | POA: Diagnosis not present

## 2024-07-02 DIAGNOSIS — G8929 Other chronic pain: Secondary | ICD-10-CM

## 2024-07-02 DIAGNOSIS — M25512 Pain in left shoulder: Secondary | ICD-10-CM

## 2024-07-02 DIAGNOSIS — G622 Polyneuropathy due to other toxic agents: Secondary | ICD-10-CM | POA: Diagnosis not present

## 2024-07-02 DIAGNOSIS — Z5112 Encounter for antineoplastic immunotherapy: Secondary | ICD-10-CM | POA: Diagnosis not present

## 2024-07-02 LAB — COMPREHENSIVE METABOLIC PANEL WITH GFR
ALT: 29 U/L (ref 0–44)
AST: 21 U/L (ref 15–41)
Albumin: 4.1 g/dL (ref 3.5–5.0)
Alkaline Phosphatase: 112 U/L (ref 38–126)
Anion gap: 10 (ref 5–15)
BUN: 22 mg/dL (ref 8–23)
CO2: 28 mmol/L (ref 22–32)
Calcium: 8.8 mg/dL — ABNORMAL LOW (ref 8.9–10.3)
Chloride: 103 mmol/L (ref 98–111)
Creatinine, Ser: 1.37 mg/dL — ABNORMAL HIGH (ref 0.44–1.00)
GFR, Estimated: 40 mL/min — ABNORMAL LOW (ref >60)
Glucose, Bld: 223 mg/dL — ABNORMAL HIGH (ref 70–99)
Potassium: 4.3 mmol/L (ref 3.5–5.1)
Sodium: 141 mmol/L (ref 135–145)
Total Bilirubin: 0.3 mg/dL (ref 0.0–1.2)
Total Protein: 6.5 g/dL (ref 6.5–8.1)

## 2024-07-02 LAB — CBC WITH DIFFERENTIAL/PLATELET
Abs Immature Granulocytes: 0.01 K/uL (ref 0.00–0.07)
Basophils Absolute: 0 K/uL (ref 0.0–0.1)
Basophils Relative: 0 %
Eosinophils Absolute: 0.2 K/uL (ref 0.0–0.5)
Eosinophils Relative: 4 %
HCT: 28.7 % — ABNORMAL LOW (ref 36.0–46.0)
Hemoglobin: 8.9 g/dL — ABNORMAL LOW (ref 12.0–15.0)
Immature Granulocytes: 0 %
Lymphocytes Relative: 16 %
Lymphs Abs: 0.6 K/uL — ABNORMAL LOW (ref 0.7–4.0)
MCH: 30.9 pg (ref 26.0–34.0)
MCHC: 31 g/dL (ref 30.0–36.0)
MCV: 99.7 fL (ref 80.0–100.0)
Monocytes Absolute: 0.5 K/uL (ref 0.1–1.0)
Monocytes Relative: 13 %
Neutro Abs: 2.5 K/uL (ref 1.7–7.7)
Neutrophils Relative %: 67 %
Platelets: 114 K/uL — ABNORMAL LOW (ref 150–400)
RBC: 2.88 MIL/uL — ABNORMAL LOW (ref 3.87–5.11)
RDW: 14.7 % (ref 11.5–15.5)
WBC: 3.8 K/uL — ABNORMAL LOW (ref 4.0–10.5)
nRBC: 0 % (ref 0.0–0.2)

## 2024-07-02 MED ORDER — DEXTROSE 5 % IV SOLN
58.0000 mg/m2 | Freq: Once | INTRAVENOUS | Status: AC
  Administered 2024-07-02: 120 mg via INTRAVENOUS
  Filled 2024-07-02: qty 60

## 2024-07-02 MED ORDER — SODIUM CHLORIDE 0.9 % IV SOLN
20.0000 mg | Freq: Once | INTRAVENOUS | Status: AC
  Administered 2024-07-02: 20 mg via INTRAVENOUS
  Filled 2024-07-02: qty 20

## 2024-07-02 MED ORDER — SODIUM CHLORIDE 0.9 % IV SOLN: INTRAVENOUS | Status: DC

## 2024-07-02 MED ORDER — ACETAMINOPHEN 325 MG PO TABS
650.0000 mg | ORAL_TABLET | Freq: Once | ORAL | Status: AC
  Administered 2024-07-02: 650 mg via ORAL
  Filled 2024-07-02: qty 2

## 2024-07-02 MED ORDER — ONDANSETRON HCL 4 MG/2ML IJ SOLN
8.0000 mg | Freq: Once | INTRAMUSCULAR | Status: AC
  Administered 2024-07-02: 8 mg via INTRAVENOUS
  Filled 2024-07-02: qty 4

## 2024-07-02 MED ORDER — FUROSEMIDE 20 MG PO TABS: 20.0000 mg | ORAL_TABLET | ORAL | 2 refills | Status: AC | PRN

## 2024-07-02 MED ORDER — SODIUM CHLORIDE 0.9 % IV SOLN: Freq: Once | INTRAVENOUS | Status: AC

## 2024-07-02 MED ORDER — MONTELUKAST SODIUM 10 MG PO TABS
10.0000 mg | ORAL_TABLET | Freq: Once | ORAL | Status: AC
  Administered 2024-07-02: 10 mg via ORAL
  Filled 2024-07-02: qty 1

## 2024-07-02 NOTE — Telephone Encounter (Signed)
 Ok to move up her follow up

## 2024-07-02 NOTE — Telephone Encounter (Signed)
 Call to patient to offer to work in 11/19 at 4:!5. It will be worked in and double booked. She did not answer. Left message to return call.

## 2024-07-02 NOTE — Patient Instructions (Signed)

## 2024-07-02 NOTE — Patient Instructions (Signed)
 [facility] - A DEPT OF [place]  Discharge Instructions: Thank you for choosing [facility] to provide your oncology and hematology care.  If you have a lab appointment with the Cancer Center - please note that after April 8th, 2024, all labs will be drawn in the cancer center.  You do not have to check in or register with the main entrance as you have in the past but will complete your check-in in the cancer center.  Wear comfortable clothing and clothing appropriate for easy access to any Portacath or PICC line.   We strive to give you quality time with your provider. You may need to reschedule your appointment if you arrive late (15 or more minutes).  Arriving late affects you and other patients whose appointments are after yours.  Also, if you miss three or more appointments without notifying the office, you may be dismissed from the clinic at the provider's discretion.      For prescription refill requests, have your pharmacy contact our office and allow 72 hours for refills to be completed.    Today you received the following chemotherapy and/or immunotherapy agents Kyprolis        To help prevent nausea and vomiting after your treatment, we encourage you to take your nausea medication as directed.  Carfilzomib  Injection What is this medication? CARFILZOMIB  (kar FILZ oh mib) treats multiple myeloma, a type of bone marrow cancer. It works by blocking a protein that causes cancer cells to grow and multiply. This helps to slow or stop the spread of cancer cells. This medicine may be used for other purposes; ask your health care provider or pharmacist if you have questions. COMMON BRAND NAME(S): KYPROLIS  What should I tell my care team before I take this medication? They need to know if you have any of these conditions: Heart disease History of blood clots Irregular heartbeat Kidney disease Liver disease Lung or breathing disease An  unusual or allergic reaction to carfilzomib , or other medications, foods, dyes, or preservatives If you or your partner are pregnant or trying to get pregnant Breastfeeding How should I use this medication? This medication is injected into a vein. It is given by your care team in a hospital or clinic setting. Talk to your care team about the use of this medication in children. Special care may be needed. Overdosage: If you think you have taken too much of this medicine contact a poison control center or emergency room at once. NOTE: This medicine is only for you. Do not share this medicine with others. What if I miss a dose? Keep appointments for follow-up doses. It is important not to miss your dose. Call your care team if you are unable to keep an appointment. What may interact with this medication? Interactions are not expected. This list may not describe all possible interactions. Give your health care provider a list of all the medicines, herbs, non-prescription drugs, or dietary supplements you use. Also tell them if you smoke, drink alcohol, or use illegal drugs. Some items may interact with your medicine. What should I watch for while using this medication? Your condition will be monitored carefully while you are receiving this medication. You may need blood work while taking this medication. Check with your care team if you have severe diarrhea, nausea, and vomiting, or if you sweat a lot. The loss of too much body fluid may make it dangerous for you to take this medication.  This medication may affect your coordination, reaction time, or judgment. Do not drive or operate machinery until you know how this medication affects you. Sit up or stand slowly to reduce the risk of dizzy or fainting spells. Drinking alcohol with this medication can increase the risk of these side effects. Talk to your care team if you may be pregnant. Serious birth defects can occur if you take this medication during  pregnancy and for 6 months after the last dose. You will need a negative pregnancy test before starting this medication. Contraception is recommended while taking this medication and for 6 months after the last dose. Your care team can help you find an option that works for you. If your partner can get pregnant, use a condom during sex while taking this medication and for 3 months after the last dose. Do not breastfeed while taking this medication and for 2 weeks after the last dose. This medication may cause infertility. Talk to your care team if you are concerned about your fertility. What side effects may I notice from receiving this medication? Side effects that you should report to your care team as soon as possible: Allergic reactions--skin rash, itching, hives, swelling of the face, lips, tongue, or throat Bleeding--bloody or black, tar-like stools, vomiting blood or brown material that looks like coffee grounds, red or dark brown urine, small red or purple spots on skin, unusual bruising or bleeding Blood clot--pain, swelling, or warmth in the leg, shortness of breath, chest pain Dizziness, loss of balance or coordination, confusion or trouble speaking Heart attack--pain or tightness in the chest, shoulders, arms, or jaw, nausea, shortness of breath, cold or clammy skin, feeling faint or lightheaded Heart failure--shortness of breath, swelling of the ankles, feet, or hands, sudden weight gain, unusual weakness or fatigue Heart rhythm changes--fast or irregular heartbeat, dizziness, feeling faint or lightheaded, chest pain, trouble breathing Increase in blood pressure Infection--fever, chills, cough, sore throat, wounds that don't heal, pain or trouble when passing urine, general feeling of discomfort or being unwell Infusion reactions--chest pain, shortness of breath or trouble breathing, feeling faint or lightheaded Kidney injury--decrease in the amount of urine, swelling of the ankles,  hands, or feet Liver injury--right upper belly pain, loss of appetite, nausea, light-colored stool, dark yellow or brown urine, yellowing skin or eyes, unusual weakness or fatigue Lung injury--shortness of breath or trouble breathing, cough, spitting up blood, chest pain, fever Pulmonary hypertension--shortness of breath, chest pain, fast or irregular heartbeat, feeling faint or lightheaded, fatigue, swelling of the ankles or feet Stomach pain, bloody diarrhea, pale skin, unusual weakness or fatigue, decrease in the amount of urine, which may be signs of hemolytic uremic syndrome Sudden and severe headache, confusion, change in vision, seizures, which may be signs of posterior reversible encephalopathy syndrome (PRES) TTP--purple spots on the skin or inside the mouth, pale skin, yellowing skin or eyes, unusual weakness or fatigue, fever, fast or irregular heartbeat, confusion, change in vision, trouble speaking, trouble walking Tumor lysis syndrome (TLS)--nausea, vomiting, diarrhea, decrease in the amount of urine, dark urine, unusual weakness or fatigue, confusion, muscle pain or cramps, fast or irregular heartbeat, joint pain Side effects that usually do not require medical attention (report to your care team if they continue or are bothersome): Diarrhea Fatigue Nausea Trouble sleeping This list may not describe all possible side effects. Call your doctor for medical advice about side effects. You may report side effects to FDA at 1-800-FDA-1088. Where should I keep my medication? This medication  is given in a hospital or clinic. It will not be stored at home. NOTE: This sheet is a summary. It may not cover all possible information. If you have questions about this medicine, talk to your doctor, pharmacist, or health care provider.  2024 Elsevier/Gold Standard (2022-01-04 00:00:00)  BELOW ARE SYMPTOMS THAT SHOULD BE REPORTED IMMEDIATELY: *FEVER GREATER THAN 100.4 F (38 C) OR HIGHER *CHILLS  OR SWEATING *NAUSEA AND VOMITING THAT IS NOT CONTROLLED WITH YOUR NAUSEA MEDICATION *UNUSUAL SHORTNESS OF BREATH *UNUSUAL BRUISING OR BLEEDING *URINARY PROBLEMS (pain or burning when urinating, or frequent urination) *BOWEL PROBLEMS (unusual diarrhea, constipation, pain near the anus) TENDERNESS IN MOUTH AND THROAT WITH OR WITHOUT PRESENCE OF ULCERS (sore throat, sores in mouth, or a toothache) UNUSUAL RASH, SWELLING OR PAIN  UNUSUAL VAGINAL DISCHARGE OR ITCHING   Items with * indicate a potential emergency and should be followed up as soon as possible or go to the Emergency Department if any problems should occur.  Please show the CHEMOTHERAPY ALERT CARD or IMMUNOTHERAPY ALERT CARD at check-in to the Emergency Department and triage nurse.  Should you have questions after your visit or need to cancel or reschedule your appointment, please contact [facility] [place] - A DEPT OF [facility] [phone]  and follow the prompts.  Office hours are 8:00 a.m. to 4:30 p.m. Monday - Friday. Please note that voicemails left after 4:00 p.m. may not be returned until the following business day.  We are closed weekends and major holidays. You have access to a nurse at all times for urgent questions. Please call the main number to the clinic [phone] and follow the prompts.  For any non-urgent questions, you may also contact your provider using MyChart. We now offer e-Visits for anyone 61 and older to request care online for non-urgent symptoms. For details visit [email or web address].   Also download the MyChart app! Go to the app store, search MyChart, open the app, select [facility], and log in with your MyChart username and password.

## 2024-07-02 NOTE — Progress Notes (Signed)
 Patient presents today for chemotherapy Kyprolis  infusion. Patient is in satisfactory condition with no new complaints voiced.  Vital signs are stable.  Labs reviewed by Dr. Davonna during the office visit and all labs are within treatment parameters.  We will proceed with treatment per MD orders.   Treatment given today per MD orders. Tolerated infusion without adverse affects. Vital signs stable. No complaints at this time. Discharged from clinic ambulatory in stable condition. Alert and oriented x 3. F/U with [facility] as scheduled.

## 2024-07-02 NOTE — Progress Notes (Signed)
 Patient Care Team: Katrinka Aquas, MD as PCP - General (Internal Medicine) Cindie Carlin POUR, DO as Consulting Physician (Gastroenterology)  Clinic Day:  07/02/2024  Referring physician: Katrinka Aquas, MD   CHIEF COMPLAINT:  CC:  IgG lambda plasma cell myeloma, stage II, standard risk    ASSESSMENT & PLAN:   Assessment & Plan: Alexandra Price  is a 78 y.o. female with Multiple myeloma  Multiple myeloma not having achieved remission Relapsed IgG lambda multiple myeloma.  Extensive oncology history as below. Patient was initially diagnosed in 2019 followed by induction chemotherapy with KCd, followed by bone marrow transplantation.  Patient was on maintenance pomalidomide  for several years prior to relapse and since then has been on daratumumab , Velcade  and dexamethasone .  Will get held for neuropathy Evaluated by Dr. Cindie at [facility] for CAR-T and patient is willing to undergo the treatment.   - Patient was evaluated by Dr. Arman at [facility] for CAR-T.  Patient is willing to proceed with CAR-T therapy at this time and is waiting for collection. Working closely with the transplant team at [facility] regarding the treatment and timing   -Velcade  held for neuropathy.  Treatment changed to daratumumab , carfilzomib  and dexamethasone . -Leukapheresis planned on 07/20/2024.  Will send recommendations for bridging therapy after collection. - We reviewed labs from today: CMP: Creatinine: 1.37-stable, normal LFTs.  CBC: Hemoglobin: 8.9, platelets: 114, WBC: 3.8.  -Physical exam stable today.  Proceed with carfilzomib  and dexamethasone  today. - Continue acyclovir  as prescribed   Return to clinic after CART for follow-up.  Chronic back pain Likely secondary to previous T11 compression fracture and also degenerative disc disease at L1 and L2   - Continue hydrocodone  10-325  every 4 hours as needed  Anxiety Anxiety related to multiple myeloma diagnosis and  also family issues  -Continue Xanax  as prescribed  Occasional tremors Stable at this time  Polyneuropathy Likely secondary to previous Velcade .  Patient reports some worsening in her fingertips   Chronic PE - Continue Eliquis  5 mg twice daily    The patient understands the plans discussed today and is in agreement with them.  She knows to contact our office if she develops concerns prior to her next appointment.  The total time spent in the appointment was 30 minutes for the encounter with patient, including review of chart and various tests results, discussions about plan of care and coordination of care plan    [name], MD  [facility] - A DEPT OF [facility] [address] [place] [address] Dept: [phone] Dept Fax: [phone]   No orders of the defined types were placed in this encounter.    ONCOLOGY HISTORY:   I have reviewed her chart and materials related to her cancer extensively and collaborated history with the patient. Summary of oncologic history is as follows:   Diagnosis:  IgG lambda plasma cell myeloma, stage II, standard risk   -Initial Presentation: Left-sided rib pain, hypercalcemia, anemia, and AKI requiring 3 day hospitalization.  -12/29/2017: CMP: Calcium  11.4. Creatinine 1.33. Alkaline Phosphatase 133. HGB 10.0. -12/30/2017: Initial Myeloma labs: M-spike 3.2. Total protein 10. Gamma globulin 4. Total globulin 6.5. IgG 4,950. Haptoglobin 244. Beta-2  Microglobulin 3.9.  -12/31/2017: Bone Marrow Biopsy.  Pathology: Hypercellular bone marrow (40-50%) involved by plasma cell neoplasm (17% plasma cells by aspirate, greater than 90% and 30-40% by CD138 IHC and flow cytometry).  -Flow Cytometry: The first  population of 4% cells is gated on plasma cells characterized by CD138/CD38 co-expression. The plasma cells are lambda-restricted, show aberrant expression of CD56, and lack  expression of CD19, CD20, and CD117. The second population of 10% cells is gated on the lymphocyte region and is composed of 59% T-cells (CD4: CDS ratio is 3.4:1) without an aberrant immunophenotype. 18% polytypic B-cells. and 23% NK-cells.  -Chromosome Analysis: Abnormal Karyotype : Complex karyotype. 57,X,-X,+1,dic(1;12)(p1?3;q24.?3),+3,+5,+6,+7,+9,add(9)(q22),+11,+15,+19,+20,+21,+22,+mar[8]/46,XX[12]  -01/08/2018: QOOR:8426.4, FKLC:17.3, ratio:90.9 -01/10/2018: UPEP: M spike:74.1, M spike/24Hr: 782 -04/17/2018-08/21/2018: 4 cycles of Kyprolis  + Cytoxan  +Dex + Pomalidomide (KCd) -07/23/2018: Bone Marrow Biopsy.  Pathology: Hypercellular marrow (40-90%) with extensive involvement by plasma cell myeloma (50-90% plasma cells). By CD138 immunohistochemistry plasma cells comprise approximately 30-40% of the core, and 90% of cells on the clot. Myeloid and erythroid elements as well as megakaryocytes are rare, but morphologically unremarkable.  -08/04/2018: Quant Immunoglobulins: IgG 497. IgM <20. IgA 30.  -08/04/2018: Other Myeloma labs: M-spike 0.32. IgG lambda immunofixation. Normal FLC ratio. Creatinine 1.62. -09/17/2018: PET:  Innumerable lytic osseous lesions involving the calvarium and the axial and appendicular skeleton without appreciable FDG uptake in keeping with patient's history of multiple myeloma.  -10/23/2018: Stem cell transplant -11/19/2018: No M-spike observed. Immunoglobulins grossly normal. Elevated kappa free light chains at 42.5 with FLC ratio at 4.05.  -01/21/2019: PET:  Similar appearance of innumerable lytic osseous lesions involving the calvarium, and the axial and appendicular skeleton in comparison to 09/17/2018 in keeping with history of multiple myeloma. There remains no significant FDG uptake associated with these lesions. Previously described foci of uptake in the left second and right sixth ribs are not present on this study and likely to have reflected a fracture that is now  healed. Unchanged remote compression fractures of the T11 and L4 vertebral bodies.  -01/21/2019: Bone Marrow Biopsy.  -Pathology: Normocellular trilineage hematopoiesis with maturation, no  increased blasts (2%). No increased plasma cells (<1% by CD138 immunohistochemical stain).   -MRD negative -03/10/2019-06/08/2022: Maintenance Pomalyst  2 mg 3 weeks on/1 week off, held from 03/29/2021 to 04/26/2021 secondary to dizziness, discontinued due to progression -10/29/2019: Bone Marrow Biopsy.  Pathology: Trilineage hematopoiesis with no evidence of plasma cells.  Chromosome analysis and FISH are normal.  -10/27/2020: Bone Marrow Biopsy.  Pathology: normocellular marrow with 30 to 40%  trilineage hematopoiesis.  2% of the total cells are CD 138+ plasma cells.  -11/16/2021: Myeloma labs at [facility]. M-spike 0.24. Immunofixation positive for IgG lambda type Bence-Jones protein. FKLC:39.75, QOOR:55.70, ratio:0.90 -12/14/2021: PET: No findings of FDG avid osseous or soft tissue myeloma. Mild degradation secondary to extensive muscular activity which could be due to motion after radiopharmaceutical injection and/or recent insulin  administration. Diffuse lytic lesions throughout the marrow space are similar and most consistent with treated myeloma. - Patient developed left shoulder blade and right posterior rib pain for the last 3 to 4 months.  -12/14/2021: 24-hour urine: total protein 136 mg. Immunofixation positive for lambda type Bence-Jones protein.  -05/24/2022: PET: Single new hypermetabolic lytic myelomatous lesion involving the left lamina of T11. No canal encroachment. No other hypermetabolic bone lesions are identified. Stable diffuse lytic myelomatous lesions throughout the axial and appendicular skeleton. No significant findings involving the neck, chest, abdomen or pelvis. -05/24/2022: M-spike 0.4. Immunofixation shows IgG monoclonal protein with lambda light chain  specificity and monoclonal  free lambda light chains.  -06/11/2022-05/20/2024: Daratumumab , Velcade  and dexamethasone   -06/20/2022: Radiation therapy to T11 lesion  -06/11/2023: MRI Brain: No evidence of acute intracranial abnormality.  -03/04/2024: MRI Thoracic  Spine: Severe degenerative disc disease at L1-L2 with Modic type 1 changes in the vertebral endplates. Old compression fracture of T11. No acute fracture. -04/22/2024: M-spike 0.2. Elevated lambda light chains at 116.6 with FLC ratio at 0.05.  - 05/19/2024: Evaluated for CAR-T at [facility] -06/11/2024: Bone marrow biopsy at [facility]: Normocellular bone marrow with trilineage hematopoiesis.  Involved by plasma cell neoplasm(11% plasma cells on aspirate, 10% on core biopsy)  - Chromosome analysis: Normal  - MRD: Positive by flow  -FISH: Positive for hyperdiploidy of chromosome 5, 9 and 15, rest: Negative -06/18/2024- Current: Daratumumab  monthly, weekly carfilzomib  and dexamethasone    Current Treatment:  Daratumumab , carfilzomib  and dexamethasone    INTERVAL HISTORY:   Discussed the use of AI scribe software for clinical note transcription with the patient, who gave verbal consent to proceed.  History of Present Illness Alexandra Price is a 78 year old female with multiple myeloma who presents for follow-up and treatment.  She is currently undergoing treatment for multiple myeloma with daratumumab , Kyprolis  and dexamethasone . She is also preparing for an upcoming endoscopy and colonoscopy as per recommendations by Dr. Arman.  She experiences persistent back and shoulder pain, with no relief from her current pain management regimen. She also reports ongoing numbness and tingling, although these symptoms are not worsening.  She is on a blood thinner and has requested a refill for furosemide . Her supplies of pain medication and Xanax  are adequate.    I have reviewed the past medical history, past surgical history, social history  and family history with the patient and they are unchanged from previous note.  ALLERGIES:  is allergic to ciprofloxacin , amoxicillin , morphine and codeine, and augmentin [amoxicillin -pot clavulanate].  MEDICATIONS:  Current Outpatient Medications  Medication Sig Dispense Refill   acetaminophen  (TYLENOL ) 325 MG tablet Take 650 mg by mouth as needed for moderate pain (prior to infusion).     acyclovir  (ZOVIRAX ) 400 MG tablet Take 1 tablet (400 mg total) by mouth 2 (two) times daily. 60 tablet 6   ALPRAZolam  (XANAX ) 0.25 MG tablet Take one tablet (0.25 mg) by mouth in the morning as needed, and three tablets (0.75 mg) at bedtime as needed 120 tablet 2   Calcium  Carb-Cholecalciferol  (CALCIUM  1000 + D PO) Take 1,000 mg by mouth daily.     ciclopirox (PENLAC) 8 % solution Apply topically daily.     dexamethasone  (DECADRON ) 4 MG tablet Take 10 tablets (40 mg) once on day 22 starting with cycles 3-4, then 5 tablets (20 mg) once on day 22 cycles 5-8. 10 tablet 3   dexAMETHasone  20 MG TABS Take 1 tablet by mouth as directed. Take 1 tablet 30 minutes prior to infusions 30 tablet 2   diclofenac  Sodium (VOLTAREN ) 1 % GEL 4 gram every 8 hours as needed. 100 g 11   diphenhydrAMINE  (BENADRYL ) 25 MG tablet Take 50 mg by mouth as needed (prior to infusion).     ELIQUIS  2.5 MG TABS tablet Take 2.5 mg by mouth 2 (two) times daily.     escitalopram  (LEXAPRO ) 20 MG tablet Take 1 tablet by mouth once daily 30 tablet 0   furosemide  (LASIX ) 20 MG tablet Take 1 tablet (20 mg total) by mouth as needed (in am as needed for swelling). 30 tablet 2   gabapentin  (NEURONTIN ) 300 MG capsule Take 1 capsule (300 mg total) by mouth 3 (three) times daily. 90 capsule 0   glipiZIDE  (GLUCOTROL ) 5 MG tablet Take 1 tablet (5 mg total)  by mouth daily before breakfast. 90 tablet 0   HYDROcodone -acetaminophen  (NORCO) 10-325 MG tablet Take 1 tablet by mouth every 4 (four) hours as needed. 180 tablet 0   ketoconazole (NIZORAL) 2 % cream  Apply topically 2 (two) times daily.     lidocaine -prilocaine  (EMLA ) cream Apply 1 Application topically as needed. 1 squeeze every 8 hours 30 g 11   midodrine  (PROAMATINE ) 2.5 MG tablet Take 1 tablet (2.5 mg total) by mouth 3 (three) times daily with meals. 90 tablet 1   Multiple Vitamin (MULTI VITAMIN) TABS Take 1 tablet by mouth daily.     ondansetron  (ZOFRAN ) 8 MG tablet Take 1 tablet (8 mg total) by mouth every 8 (eight) hours as needed for nausea or vomiting. 30 tablet 1   OZEMPIC, 0.25 OR 0.5 MG/DOSE, 2 MG/3ML SOPN INJECT 0.25 MG INTO THE SKIN ONCE A WEEK FOR 4 WEEKS, THEN 0.5 MG ONCE A WEEK.     pantoprazole  (PROTONIX ) 40 MG tablet Take 1 tablet (40 mg total) by mouth daily. 90 tablet 1   PEG 3350 -KCl-NaCl-NaSulf-MgSul (SUFLAVE ) 178.7 g SOLR Take 1 kit by mouth as directed. 1 each    polyethylene glycol (MIRALAX  / GLYCOLAX ) 17 g packet Take 17 g by mouth daily as needed for moderate constipation. 14 each 0   prochlorperazine  (COMPAZINE ) 10 MG tablet Take 1 tablet (10 mg total) by mouth every 6 (six) hours as needed for nausea or vomiting. 30 tablet 0   prochlorperazine  (COMPAZINE ) 10 MG tablet Take 1 tablet (10 mg total) by mouth every 6 (six) hours as needed for nausea or vomiting. 30 tablet 1   rosuvastatin  (CRESTOR ) 10 MG tablet Take 10 mg by mouth daily.     zolpidem  (AMBIEN ) 10 MG tablet Take 1 tablet (10 mg total) by mouth at bedtime as needed. 30 tablet 5   No current facility-administered medications for this visit.    VITALS:  There were no vitals taken for this visit.  Wt Readings from Last 3 Encounters:  07/02/24 192 lb 6.4 oz (87.3 kg)  06/29/24 192 lb 3.2 oz (87.2 kg)  06/25/24 192 lb 10.9 oz (87.4 kg)    There is no height or weight on file to calculate BMI.  Performance status (ECOG): 1 - Symptomatic but completely ambulatory  PHYSICAL EXAM:   GENERAL:alert, no distress and comfortable SKIN: skin color, texture, turgor are normal, no rashes or significant  lesions LYMPH:  no palpable lymphadenopathy in the cervical, axillary or inguinal LUNGS: clear to auscultation and percussion with normal breathing effort HEART: regular rate & rhythm and no murmurs and no lower extremity edema ABDOMEN:abdomen soft, non-tender and normal bowel sounds Musculoskeletal:no cyanosis of digits and no clubbing  NEURO: alert & oriented x 3 with fluent speech  LABORATORY DATA:  I have reviewed the data as listed  Lab Results  Component Value Date   WBC 3.8 (L) 07/02/2024   NEUTROABS 2.5 07/02/2024   HGB 8.9 (L) 07/02/2024   HCT 28.7 (L) 07/02/2024   MCV 99.7 07/02/2024   PLT 114 (L) 07/02/2024      Chemistry      Component Value Date/Time   NA 141 07/02/2024 0824   K 4.3 07/02/2024 0824   CL 103 07/02/2024 0824   CO2 28 07/02/2024 0824   BUN 22 07/02/2024 0824   CREATININE 1.37 (H) 07/02/2024 0824      Component Value Date/Time   CALCIUM  8.8 (L) 07/02/2024 0824   ALKPHOS 112 07/02/2024 0824   AST  21 07/02/2024 0824   ALT 29 07/02/2024 0824   BILITOT 0.3 07/02/2024 0824       RADIOGRAPHIC STUDIES: I have personally reviewed the radiological images as listed and agreed with the findings in the report.

## 2024-07-02 NOTE — Progress Notes (Signed)
 Patient has been examined by Dr. Davonna. Vital signs and labs have been reviewed by MD - ANC, Creatinine, LFTs, hemoglobin, and platelets have been reviewed by M.D. - pt may proceed with treatment.  Primary RN and pharmacy notified.

## 2024-07-06 ENCOUNTER — Other Ambulatory Visit: Payer: Self-pay | Admitting: *Deleted

## 2024-07-06 ENCOUNTER — Other Ambulatory Visit: Payer: Self-pay | Admitting: Oncology

## 2024-07-06 DIAGNOSIS — C9000 Multiple myeloma not having achieved remission: Secondary | ICD-10-CM

## 2024-07-06 MED ORDER — HYDROCODONE-ACETAMINOPHEN 10-325 MG PO TABS: 1.0000 | ORAL_TABLET | ORAL | 0 refills | Status: DC | PRN

## 2024-07-06 NOTE — Telephone Encounter (Signed)
 Added to schedule.

## 2024-07-06 NOTE — Telephone Encounter (Signed)
 Pt called back Sating that she would lie to come in for appt at 11/19  @ 4:15 PM , Can you add to scheduled  Please

## 2024-07-06 NOTE — Telephone Encounter (Signed)
 Called and LVM for pt to call back and confirm that she is able to accept this time and date.

## 2024-07-06 NOTE — Anesthesia Preprocedure Evaluation (Signed)
 Anesthesia Evaluation  Patient identified by MRN, date of birth, ID band Patient awake    Reviewed: Allergy & Precautions, NPO status , Patient's Chart, lab work & pertinent test results  Airway Mallampati: II  TM Distance: >3 FB Neck ROM: Full    Dental  (+) Dental Advisory Given, Missing, Poor Dentition, Chipped   Pulmonary shortness of breath and with exertion, pneumonia, PE   Pulmonary exam normal breath sounds clear to auscultation       Cardiovascular Exercise Tolerance: Poor hypertension, Pt. on medications  Rhythm:Regular Rate:Bradycardia + Systolic murmurs    Neuro/Psych  PSYCHIATRIC DISORDERS Anxiety Depression    negative neurological ROS     GI/Hepatic Neg liver ROS,GERD  Medicated and Poorly Controlled,,  Endo/Other  diabetes, Type 2    Renal/GU Renal InsufficiencyRenal disease  negative genitourinary   Musculoskeletal negative musculoskeletal ROS (+)    Abdominal   Peds negative pediatric ROS (+)  Hematology  (+) Blood dyscrasia (multiple myeloma), anemia Hgb 8.9   Anesthesia Other Findings Multiple myeloma  Reproductive/Obstetrics negative OB ROS                              Anesthesia Physical Anesthesia Plan  ASA: 3  Anesthesia Plan: General   Post-op Pain Management: Minimal or no pain anticipated   Induction: Intravenous  PONV Risk Score and Plan: Propofol  infusion  Airway Management Planned: Nasal Cannula and Natural Airway  Additional Equipment: None  Intra-op Plan:   Post-operative Plan:   Informed Consent: I have reviewed the patients History and Physical, chart, labs and discussed the procedure including the risks, benefits and alternatives for the proposed anesthesia with the patient or authorized representative who has indicated his/her understanding and acceptance.     Dental advisory given  Plan Discussed with: CRNA  Anesthesia Plan  Comments:          Anesthesia Quick Evaluation

## 2024-07-07 ENCOUNTER — Other Ambulatory Visit: Payer: Self-pay

## 2024-07-07 ENCOUNTER — Encounter (HOSPITAL_COMMUNITY)
Admission: RE | Disposition: A | Discharge status: Home / Self Care | Payer: Self-pay | Source: Home / Self Care | Attending: Internal Medicine

## 2024-07-07 ENCOUNTER — Encounter (HOSPITAL_COMMUNITY): Payer: Self-pay | Admitting: Internal Medicine

## 2024-07-07 ENCOUNTER — Ambulatory Visit (HOSPITAL_COMMUNITY): Admitting: Anesthesiology

## 2024-07-07 ENCOUNTER — Other Ambulatory Visit: Payer: Self-pay | Admitting: *Deleted

## 2024-07-07 ENCOUNTER — Ambulatory Visit (HOSPITAL_COMMUNITY)
Admission: RE | Admit: 2024-07-07 | Discharge: 2024-07-07 | Disposition: A | Discharge status: Home / Self Care | Attending: Internal Medicine | Admitting: Internal Medicine

## 2024-07-07 ENCOUNTER — Ambulatory Visit (HOSPITAL_BASED_OUTPATIENT_CLINIC_OR_DEPARTMENT_OTHER): Admitting: Anesthesiology

## 2024-07-07 DIAGNOSIS — I10 Essential (primary) hypertension: Secondary | ICD-10-CM | POA: Diagnosis not present

## 2024-07-07 DIAGNOSIS — R1310 Dysphagia, unspecified: Secondary | ICD-10-CM | POA: Diagnosis not present

## 2024-07-07 DIAGNOSIS — E119 Type 2 diabetes mellitus without complications: Secondary | ICD-10-CM | POA: Diagnosis not present

## 2024-07-07 DIAGNOSIS — R933 Abnormal findings on diagnostic imaging of other parts of digestive tract: Secondary | ICD-10-CM | POA: Insufficient documentation

## 2024-07-07 DIAGNOSIS — C9000 Multiple myeloma not having achieved remission: Secondary | ICD-10-CM

## 2024-07-07 DIAGNOSIS — B3781 Candidal esophagitis: Secondary | ICD-10-CM

## 2024-07-07 DIAGNOSIS — Z7985 Long-term (current) use of injectable non-insulin antidiabetic drugs: Secondary | ICD-10-CM | POA: Insufficient documentation

## 2024-07-07 DIAGNOSIS — K5730 Diverticulosis of large intestine without perforation or abscess without bleeding: Secondary | ICD-10-CM

## 2024-07-07 DIAGNOSIS — K621 Rectal polyp: Secondary | ICD-10-CM | POA: Diagnosis not present

## 2024-07-07 DIAGNOSIS — K648 Other hemorrhoids: Secondary | ICD-10-CM

## 2024-07-07 DIAGNOSIS — Z7984 Long term (current) use of oral hypoglycemic drugs: Secondary | ICD-10-CM | POA: Diagnosis not present

## 2024-07-07 DIAGNOSIS — F418 Other specified anxiety disorders: Secondary | ICD-10-CM | POA: Diagnosis not present

## 2024-07-07 DIAGNOSIS — Z7901 Long term (current) use of anticoagulants: Secondary | ICD-10-CM | POA: Diagnosis not present

## 2024-07-07 DIAGNOSIS — D125 Benign neoplasm of sigmoid colon: Secondary | ICD-10-CM | POA: Diagnosis not present

## 2024-07-07 DIAGNOSIS — K649 Unspecified hemorrhoids: Secondary | ICD-10-CM

## 2024-07-07 DIAGNOSIS — K2970 Gastritis, unspecified, without bleeding: Secondary | ICD-10-CM | POA: Insufficient documentation

## 2024-07-07 DIAGNOSIS — K219 Gastro-esophageal reflux disease without esophagitis: Secondary | ICD-10-CM | POA: Diagnosis not present

## 2024-07-07 DIAGNOSIS — Z86711 Personal history of pulmonary embolism: Secondary | ICD-10-CM | POA: Insufficient documentation

## 2024-07-07 HISTORY — PX: COLONOSCOPY: SHX5424

## 2024-07-07 HISTORY — PX: ESOPHAGOGASTRODUODENOSCOPY: SHX5428

## 2024-07-07 LAB — GLUCOSE, CAPILLARY: Glucose-Capillary: 168 mg/dL — ABNORMAL HIGH (ref 70–99)

## 2024-07-07 SURGERY — COLONOSCOPY
Anesthesia: General

## 2024-07-07 MED ORDER — LACTATED RINGERS IV SOLN: INTRAVENOUS | Status: DC

## 2024-07-07 MED ORDER — HYDROCODONE-ACETAMINOPHEN 10-325 MG PO TABS: 1.0000 | ORAL_TABLET | ORAL | 0 refills | Status: AC | PRN

## 2024-07-07 MED ORDER — PROPOFOL 10 MG/ML IV BOLUS
INTRAVENOUS | Status: DC | PRN
  Administered 2024-07-07: 125 ug/kg/min via INTRAVENOUS
  Administered 2024-07-07: 70 mg via INTRAVENOUS

## 2024-07-07 MED ORDER — LIDOCAINE 2% (20 MG/ML) 5 ML SYRINGE
INTRAMUSCULAR | Status: DC | PRN
Start: 2024-07-07 — End: 2024-07-07
  Administered 2024-07-07: 100 mg via INTRAVENOUS

## 2024-07-07 MED ORDER — FLUCONAZOLE 200 MG PO TABS
ORAL_TABLET | ORAL | 0 refills | Status: AC
Start: 2024-07-07 — End: ?

## 2024-07-07 NOTE — Discharge Instructions (Addendum)
 EGD Discharge instructions Please read the instructions outlined below and refer to this sheet in the next few weeks. These discharge instructions provide you with general information on caring for yourself after you leave the hospital. Your doctor may also give you specific instructions. While your treatment has been planned according to the most current medical practices available, unavoidable complications occasionally occur. If you have any problems or questions after discharge, please call your doctor. ACTIVITY You may resume your regular activity but move at a slower pace for the next 24 hours.  Take frequent rest periods for the next 24 hours.  Walking will help expel (get rid of) the air and reduce the bloated feeling in your abdomen.  No driving for 24 hours (because of the anesthesia (medicine) used during the test).  You may shower.  Do not sign any important legal documents or operate any machinery for 24 hours (because of the anesthesia used during the test).  NUTRITION Drink plenty of fluids.  You may resume your normal diet.  Begin with a light meal and progress to your normal diet.  Avoid alcoholic beverages for 24 hours or as instructed by your caregiver.  MEDICATIONS You may resume your normal medications unless your caregiver tells you otherwise.  WHAT YOU CAN EXPECT TODAY You may experience abdominal discomfort such as a feeling of fullness or "gas" pains.  FOLLOW-UP Your doctor will discuss the results of your test with you.  SEEK IMMEDIATE MEDICAL ATTENTION IF ANY OF THE FOLLOWING OCCUR: Excessive nausea (feeling sick to your stomach) and/or vomiting.  Severe abdominal pain and distention (swelling).  Trouble swallowing.  Temperature over 101 F (37.8 C).  Rectal bleeding or vomiting of blood.      Colonoscopy Discharge Instructions  Read the instructions outlined below and refer to this sheet in the next few weeks. These discharge instructions provide you  with general information on caring for yourself after you leave the hospital. Your doctor may also give you specific instructions. While your treatment has been planned according to the most current medical practices available, unavoidable complications occasionally occur.   ACTIVITY You may resume your regular activity, but move at a slower pace for the next 24 hours.  Take frequent rest periods for the next 24 hours.  Walking will help get rid of the air and reduce the bloated feeling in your belly (abdomen).  No driving for 24 hours (because of the medicine (anesthesia) used during the test).   Do not sign any important legal documents or operate any machinery for 24 hours (because of the anesthesia used during the test).  NUTRITION Drink plenty of fluids.  You may resume your normal diet as instructed by your doctor.  Begin with a light meal and progress to your normal diet. Heavy or fried foods are harder to digest and may make you feel sick to your stomach (nauseated).  Avoid alcoholic beverages for 24 hours or as instructed.  MEDICATIONS You may resume your normal medications unless your doctor tells you otherwise.  WHAT YOU CAN EXPECT TODAY Some feelings of bloating in the abdomen.  Passage of more gas than usual.  Spotting of blood in your stool or on the toilet paper.  IF YOU HAD POLYPS REMOVED DURING THE COLONOSCOPY: No aspirin  products for 7 days or as instructed.  No alcohol for 7 days or as instructed.  Eat a soft diet for the next 24 hours.  FINDING OUT THE RESULTS OF YOUR TEST Not all test results  are available during your visit. If your test results are not back during the visit, make an appointment with your caregiver to find out the results. Do not assume everything is normal if you have not heard from your caregiver or the medical facility. It is important for you to follow up on all of your test results.  SEEK IMMEDIATE MEDICAL ATTENTION IF: You have more than a  spotting of blood in your stool.  Your belly is swollen (abdominal distention).  You are nauseated or vomiting.  You have a temperature over 101.  You have abdominal pain or discomfort that is severe or gets worse throughout the day.   Your upper endoscopy revealed candidal esophagitis.  This is a yeast infection likely related to your cancer treatment.  This is likely causing your difficulty with food sticking in your esophagus.  Stomach and small bowel were normal.  I am going to send in 14-day course of fluconazole .  Take 400 mg day 1 (2 tablets) and then 200 mg daily thereafter (1 tablet) for a total 14 days.  Your colonoscopy revealed 2 small  polyp(s) which I removed successfully. Await pathology results, my office will contact you. I recommend repeating colonoscopy in 5 years for surveillance purposes, depending on pathology results.  You also have diverticulosis and internal hemorrhoids. I would recommend increasing fiber in your diet or adding OTC Benefiber/Metamucil. Be sure to drink at least 4 to 6 glasses of water daily.   I extensively examined your transverse colon.  I did not find any polyps or evidence of colon cancer.  Likely the spot seen on your PET scan was a contraction at time of image.  Follow-up with GI as needed.   I hope you have a great rest of your week!  Carlin POUR. Cindie, D.O. Gastroenterology and Hepatology [facility]

## 2024-07-07 NOTE — Interval H&P Note (Signed)
 History and Physical Interval Note:  07/07/2024 8:05 AM  Alexandra Price  has presented today for surgery, with the diagnosis of ABNORMAL COLON PET, DYSPHAGIA.  The various methods of treatment have been discussed with the patient and family. After consideration of risks, benefits and other options for treatment, the patient has consented to  Procedure(s) with comments: COLONOSCOPY (N/A) - 815AM, [place] RM 1 EGD (ESOPHAGOGASTRODUODENOSCOPY) (N/A) DILATION, ESOPHAGUS (N/A) as a surgical intervention.  The patient's history has been reviewed, patient examined, no change in status, stable for surgery.  I have reviewed the patient's chart and labs.  Questions were answered to the patient's satisfaction.     Alexandra Price

## 2024-07-07 NOTE — Anesthesia Postprocedure Evaluation (Signed)
 Anesthesia Post Note  Patient: Alexandra Price  Procedure(s) Performed: COLONOSCOPY EGD (ESOPHAGOGASTRODUODENOSCOPY)  Patient location during evaluation: Endoscopy Anesthesia Type: General Level of consciousness: awake and alert Pain management: pain level controlled Vital Signs Assessment: post-procedure vital signs reviewed and stable Respiratory status: spontaneous breathing, nonlabored ventilation and respiratory function stable Cardiovascular status: stable Anesthetic complications: no   There were no known notable events for this encounter.   Last Vitals:  Vitals:   07/07/24 0734 07/07/24 0847  BP: (!) 156/81 123/65  Pulse: 67 73  Resp: 16 13  Temp: 36.5 C 36.5 C  SpO2: 98% 100%    Last Pain:  Vitals:   07/07/24 0847  TempSrc: Oral  PainSc: 0-No pain                 Dorinne Graeff L Zakariah Urwin

## 2024-07-07 NOTE — Op Note (Signed)
 [facility] Patient Name: Alexandra Price Procedure Date: 07/07/2024 7:38 AM MRN: [id number] Date of Birth: 30-Dec-1945 Attending MD: Carlin POUR. Cindie , [place], [id number] CSN: [id number] Age: 78 Admit Type: Outpatient Procedure:                Colonoscopy Indications:              Abnormal PET scan of the GI tract Providers:                Carlin POUR. Cindie, DO, Harlene Lips, Devere Lodge Referring MD:              Medicines:                See the Anesthesia note for documentation of the                            administered medications Complications:            No immediate complications. Estimated Blood Loss:     Estimated blood loss was minimal. Procedure:                Pre-Anesthesia Assessment:                           - The anesthesia plan was to use monitored                            anesthesia care (MAC).                           After obtaining informed consent, the colonoscope                            was passed under direct vision. Throughout the                            procedure, the patient's blood pressure, pulse, and                            oxygen  saturations were monitored continuously. The                            [id number]-[id number] ([id number]) Peds Colon was introduced                            through the anus and advanced to the the terminal                            ileum, with identification of the appendiceal                            orifice and IC valve. The colonoscopy was performed                            without difficulty. The patient tolerated the  procedure well. The quality of the bowel                            preparation was evaluated using the BBPS [facility]                            Bowel Preparation Scale) with scores of: Right                            Colon = 3, Transverse Colon = 3 and Left Colon = 3                            (entire mucosa seen well with no residual staining,                             small fragments of stool or opaque liquid). The                            total BBPS score equals 9. Scope In: 8:26:19 AM Scope Out: 8:41:21 AM Scope Withdrawal Time: 0 hours 12 minutes 54 seconds  Total Procedure Duration: 0 hours 15 minutes 2 seconds  Findings:      Hemorrhoids were found on perianal exam.      Non-bleeding internal hemorrhoids were found.      A few small-mouthed diverticula were found in the sigmoid colon.      Two sessile polyps were found in the rectum and sigmoid colon. The       polyps were 4 to 6 mm in size. These polyps were removed with a cold       snare. Resection and retrieval were complete.      The terminal ileum appeared normal.      The exam was otherwise without abnormality. Transverse colon examined       extensively. No finding to correlate with abnormal PET scan. Impression:               - Hemorrhoids found on perianal exam.                           - Non-bleeding internal hemorrhoids.                           - Diverticulosis in the sigmoid colon.                           - Two 4 to 6 mm polyps in the rectum and in the                            sigmoid colon, removed with a cold snare. Resected                            and retrieved.                           - The examined portion of the ileum was normal.                           -  The examination was otherwise normal. Moderate Sedation:      Per Anesthesia Care Recommendation:           - Patient has a contact number available for                            emergencies. The signs and symptoms of potential                            delayed complications were discussed with the                            patient. Return to normal activities tomorrow.                            Written discharge instructions were provided to the                            patient.                           - Resume previous diet.                           - Continue present medications.                            - Await pathology results.                           - Repeat colonoscopy in 5 years for surveillance.                           - Return to GI clinic PRN. Procedure Code(s):        --- Professional ---                           [phone], Colonoscopy, flexible; with removal of                            tumor(s), polyp(s), or other lesion(s) by snare                            technique Diagnosis Code(s):        --- Professional ---                           K64.8, Other hemorrhoids                           D12.8, Benign neoplasm of rectum                           D12.5, Benign neoplasm of sigmoid colon                           K57.30, Diverticulosis of large intestine without  perforation or abscess without bleeding                           R93.3, Abnormal findings on diagnostic imaging of                            other parts of digestive tract CPT copyright 2022 American Medical Association. All rights reserved. The codes documented in this report are preliminary and upon coder review may  be revised to meet current compliance requirements. Carlin POUR. Cindie, DO Carlin POUR. Cindie, DO 07/07/2024 8:48:20 AM This report has been signed electronically. Number of Addenda: 0

## 2024-07-07 NOTE — Transfer of Care (Addendum)
 Immediate Anesthesia Transfer of Care Note  Patient: JALEYAH LONGHI  Procedure(s) Performed: COLONOSCOPY EGD (ESOPHAGOGASTRODUODENOSCOPY)  Patient Location: Endoscopy Unit  Anesthesia Type:General  Level of Consciousness: drowsy and patient cooperative  Airway & Oxygen  Therapy: Patient Spontanous Breathing and Patient connected to face mask oxygen   Post-op Assessment: Report given to RN and Post -op Vital signs reviewed and stable  Post vital signs: Reviewed and stable  Last Vitals:  Vitals Value Taken Time  BP 123/65 07/07/24   0847  Temp 36.5 07/07/24   0847  Pulse 73 07/07/24   0847  Resp 13 07/07/24   0847  SpO2 100% 07/07/24   0847    Last Pain:  Vitals:   07/07/24 0809  TempSrc:   PainSc: 4       Patients Stated Pain Goal: 6 (07/07/24 0734)  Complications: No notable events documented.

## 2024-07-07 NOTE — Op Note (Signed)
 [facility] Patient Name: Alexandra Price Procedure Date: 07/07/2024 7:39 AM MRN: [id number] Date of Birth: November 22, 1945 Attending MD: Carlin POUR. Cindie , [place], [id number] CSN: [id number] Age: 78 Admit Type: Outpatient Procedure:                Upper GI endoscopy Indications:              Dysphagia Providers:                Carlin POUR. Cindie, DO, Harlene Lips, Devere Lodge Referring MD:              Medicines:                See the Anesthesia note for documentation of the                            administered medications Complications:            No immediate complications. Estimated Blood Loss:     Estimated blood loss: none. Procedure:                Pre-Anesthesia Assessment:                           - The anesthesia plan was to use monitored                            anesthesia care (MAC).                           After obtaining informed consent, the endoscope was                            passed under direct vision. Throughout the                            procedure, the patient's blood pressure, pulse, and                            oxygen  saturations were monitored continuously. The                            [id number] ([id number]) Upper was introduced through                            the mouth, and advanced to the second part of                            duodenum. The upper GI endoscopy was accomplished                            without difficulty. The patient tolerated the                            procedure well. Scope In: 8:17:39 AM Scope Out: 8:19:39 AM Total Procedure Duration: 0 hours 2 minutes 0 seconds  Findings:      Moderately severe candidal esophagitis with no bleeding was found in the  upper and middle thirds of the esophagus.      The entire examined stomach was normal.      The duodenal bulb, first portion of the duodenum and second portion of       the duodenum were normal. Impression:               - Moderately severe candidiasis  esophagitis with no                            bleeding.                           - Normal stomach.                           - Normal duodenal bulb, first portion of the                            duodenum and second portion of the duodenum.                           - No specimens collected. Moderate Sedation:      Per Anesthesia Care Recommendation:           - Patient has a contact number available for                            emergencies. The signs and symptoms of potential                            delayed complications were discussed with the                            patient. Return to normal activities tomorrow.                            Written discharge instructions were provided to the                            patient.                           - Resume previous diet.                           - Continue present medications.                           - Diflucan  (fluconazole ) 400 mg PO day 1, then 200                            mg PO daily for total of 14 days.                           - See colonoscopy report for further recommendations Procedure Code(s):        --- Professional ---  56764, Esophagogastroduodenoscopy, flexible,                            transoral; diagnostic, including collection of                            specimen(s) by brushing or washing, when performed                            (separate procedure) Diagnosis Code(s):        --- Professional ---                           B37.81, Candidal esophagitis                           R13.10, Dysphagia, unspecified CPT copyright 2022 American Medical Association. All rights reserved. The codes documented in this report are preliminary and upon coder review may  be revised to meet current compliance requirements. Carlin POUR. Cindie, DO Carlin POUR. Cindie, DO 07/07/2024 8:45:25 AM This report has been signed electronically. Number of Addenda: 0

## 2024-07-07 NOTE — Anesthesia Procedure Notes (Signed)
 Date/Time: 07/07/2024 8:13 AM  Performed by: Para Jerelene CROME, CRNAOxygen Delivery Method: Simple face mask Comments: POM Face Mask.

## 2024-07-08 ENCOUNTER — Ambulatory Visit: Admitting: Neurology

## 2024-07-09 LAB — SURGICAL PATHOLOGY

## 2024-07-10 ENCOUNTER — Encounter (HOSPITAL_COMMUNITY): Payer: Self-pay | Admitting: Internal Medicine

## 2024-07-13 ENCOUNTER — Other Ambulatory Visit: Payer: Self-pay | Admitting: *Deleted

## 2024-07-13 DIAGNOSIS — C9000 Multiple myeloma not having achieved remission: Secondary | ICD-10-CM

## 2024-07-13 MED ORDER — ACYCLOVIR 400 MG PO TABS: 400.0000 mg | ORAL_TABLET | Freq: Two times a day (BID) | ORAL | 6 refills | Status: AC

## 2024-07-14 ENCOUNTER — Encounter: Payer: Self-pay | Admitting: Oncology

## 2024-07-14 ENCOUNTER — Other Ambulatory Visit: Payer: Self-pay

## 2024-07-14 NOTE — Progress Notes (Unsigned)
 Orders from [facility]:  Alexandra Price will need additional chemotherapy of:  Kyprolis  56 mg/m2 IVPB weekly x 3 doses + Cyclophosphamide  300 mg/m2 weekly x 3 doses  With appropriate premedication.  To begin 07/21/24 or 07/22/24  V.O. Dr Ivery Molt, PharmD

## 2024-07-16 ENCOUNTER — Encounter: Payer: Self-pay | Admitting: Oncology

## 2024-07-19 ENCOUNTER — Other Ambulatory Visit: Payer: Self-pay | Admitting: Oncology

## 2024-07-19 DIAGNOSIS — C9000 Multiple myeloma not having achieved remission: Secondary | ICD-10-CM

## 2024-07-20 ENCOUNTER — Other Ambulatory Visit: Payer: Self-pay | Admitting: *Deleted

## 2024-07-22 NOTE — Progress Notes (Signed)
 Pharmacist Chemotherapy Monitoring - Initial Assessment    Anticipated start date: 07/23/24   The following has been reviewed per standard work regarding the patient's treatment regimen: The patient's diagnosis, treatment plan and drug doses, and organ/hematologic function Lab orders and baseline tests specific to treatment regimen  The treatment plan start date, drug sequencing, and pre-medications Prior authorization status  Patient's documented medication list, including drug-drug interaction screen and prescriptions for anti-emetics and supportive care specific to the treatment regimen The drug concentrations, fluid compatibility, administration routes, and timing of the medications to be used The patient's access for treatment and lifetime cumulative dose history, if applicable  The patient's medication allergies and previous infusion related reactions, if applicable   Changes made to treatment plan:  pre-medications adjusted for Cytoxan  to be added to Kyprolis  Days 1, 8, 15   Dr Davonna orders are for cytoxan  300 mg/m2.  Plan updates reviewed by Kolleen Newsome, PharmD and Dr Davonna reviewed and signed the changes.  Plan will be for 3 weekly doses of each then she is scheduled for [facility] at [facility].    Follow up needed:  N/A   Niels FORBES Molt, [facility], 07/22/2024  12:44 PM

## 2024-07-23 ENCOUNTER — Inpatient Hospital Stay

## 2024-07-23 ENCOUNTER — Inpatient Hospital Stay: Attending: Hematology

## 2024-07-23 VITALS — BP 117/75 | HR 77 | Temp 97.9°F | Resp 18 | Wt 197.2 lb

## 2024-07-23 DIAGNOSIS — C9002 Multiple myeloma in relapse: Secondary | ICD-10-CM | POA: Diagnosis present

## 2024-07-23 DIAGNOSIS — Z5112 Encounter for antineoplastic immunotherapy: Secondary | ICD-10-CM | POA: Diagnosis present

## 2024-07-23 DIAGNOSIS — C9000 Multiple myeloma not having achieved remission: Secondary | ICD-10-CM

## 2024-07-23 DIAGNOSIS — Z5111 Encounter for antineoplastic chemotherapy: Secondary | ICD-10-CM | POA: Insufficient documentation

## 2024-07-23 LAB — COMPREHENSIVE METABOLIC PANEL WITH GFR
ALT: 30 U/L (ref 0–44)
AST: 27 U/L (ref 15–41)
Albumin: 3.9 g/dL (ref 3.5–5.0)
Alkaline Phosphatase: 118 U/L (ref 38–126)
Anion gap: 13 (ref 5–15)
BUN: 20 mg/dL (ref 8–23)
CO2: 29 mmol/L (ref 22–32)
Calcium: 9.3 mg/dL (ref 8.9–10.3)
Chloride: 97 mmol/L — ABNORMAL LOW (ref 98–111)
Creatinine, Ser: 1.55 mg/dL — ABNORMAL HIGH (ref 0.44–1.00)
GFR, Estimated: 34 mL/min — ABNORMAL LOW (ref >60)
Glucose, Bld: 218 mg/dL — ABNORMAL HIGH (ref 70–99)
Potassium: 4.6 mmol/L (ref 3.5–5.1)
Sodium: 140 mmol/L (ref 135–145)
Total Bilirubin: 0.3 mg/dL (ref 0.0–1.2)
Total Protein: 6.4 g/dL — ABNORMAL LOW (ref 6.5–8.1)

## 2024-07-23 LAB — CBC WITH DIFFERENTIAL/PLATELET
Abs Immature Granulocytes: 0.01 K/uL (ref 0.00–0.07)
Basophils Absolute: 0 K/uL (ref 0.0–0.1)
Basophils Relative: 1 %
Eosinophils Absolute: 0.1 K/uL (ref 0.0–0.5)
Eosinophils Relative: 4 %
HCT: 27.6 % — ABNORMAL LOW (ref 36.0–46.0)
Hemoglobin: 8.4 g/dL — ABNORMAL LOW (ref 12.0–15.0)
Immature Granulocytes: 0 %
Lymphocytes Relative: 18 %
Lymphs Abs: 0.5 K/uL — ABNORMAL LOW (ref 0.7–4.0)
MCH: 30.2 pg (ref 26.0–34.0)
MCHC: 30.4 g/dL (ref 30.0–36.0)
MCV: 99.3 fL (ref 80.0–100.0)
Monocytes Absolute: 0.4 K/uL (ref 0.1–1.0)
Monocytes Relative: 12 %
Neutro Abs: 1.8 K/uL (ref 1.7–7.7)
Neutrophils Relative %: 65 %
Platelets: 125 K/uL — ABNORMAL LOW (ref 150–400)
RBC: 2.78 MIL/uL — ABNORMAL LOW (ref 3.87–5.11)
RDW: 14.2 % (ref 11.5–15.5)
WBC: 2.8 K/uL — ABNORMAL LOW (ref 4.0–10.5)
nRBC: 0 % (ref 0.0–0.2)

## 2024-07-23 MED ORDER — PALONOSETRON HCL INJECTION 0.25 MG/5ML
0.2500 mg | Freq: Once | INTRAVENOUS | Status: AC
  Administered 2024-07-23: 0.25 mg via INTRAVENOUS
  Filled 2024-07-23: qty 5

## 2024-07-23 MED ORDER — SODIUM CHLORIDE 0.9 % IV SOLN: Freq: Once | INTRAVENOUS | Status: AC

## 2024-07-23 MED ORDER — SODIUM CHLORIDE 0.9 % IV SOLN: INTRAVENOUS | Status: DC

## 2024-07-23 MED ORDER — SODIUM CHLORIDE 0.9 % IV SOLN
300.0000 mg/m2 | Freq: Once | INTRAVENOUS | Status: AC
  Administered 2024-07-23: 600 mg via INTRAVENOUS
  Filled 2024-07-23: qty 30

## 2024-07-23 MED ORDER — DEXTROSE 5 % IV SOLN
58.0000 mg/m2 | Freq: Once | INTRAVENOUS | Status: AC
  Administered 2024-07-23: 120 mg via INTRAVENOUS
  Filled 2024-07-23: qty 60

## 2024-07-23 MED ORDER — ACETAMINOPHEN 325 MG PO TABS
650.0000 mg | ORAL_TABLET | Freq: Once | ORAL | Status: AC
  Administered 2024-07-23: 650 mg via ORAL
  Filled 2024-07-23: qty 2

## 2024-07-23 MED ORDER — SODIUM CHLORIDE 0.9 % IV SOLN
20.0000 mg | Freq: Once | INTRAVENOUS | Status: AC
  Administered 2024-07-23: 20 mg via INTRAVENOUS
  Filled 2024-07-23: qty 20

## 2024-07-23 NOTE — Patient Instructions (Signed)
 [facility] [place] - A DEPT OF [place]  Discharge Instructions: Thank you for choosing [facility] to provide your oncology and hematology care.  If you have a lab appointment with the Cancer Center - please note that after April 8th, 2024, all labs will be drawn in the cancer center.  You do not have to check in or register with the main entrance as you have in the past but will complete your check-in in the cancer center.  Wear comfortable clothing and clothing appropriate for easy access to any Portacath or PICC line.   We strive to give you quality time with your provider. You may need to reschedule your appointment if you arrive late (15 or more minutes).  Arriving late affects you and other patients whose appointments are after yours.  Also, if you miss three or more appointments without notifying the office, you may be dismissed from the clinic at the provider's discretion.      For prescription refill requests, have your pharmacy contact our office and allow 72 hours for refills to be completed.    Today you received the following chemotherapy and/or immunotherapy agents Kyprolis /Cytoxan        To help prevent nausea and vomiting after your treatment, we encourage you to take your nausea medication as directed.  BELOW ARE SYMPTOMS THAT SHOULD BE REPORTED IMMEDIATELY: *FEVER GREATER THAN 100.4 F (38 C) OR HIGHER *CHILLS OR SWEATING *NAUSEA AND VOMITING THAT IS NOT CONTROLLED WITH YOUR NAUSEA MEDICATION *UNUSUAL SHORTNESS OF BREATH *UNUSUAL BRUISING OR BLEEDING *URINARY PROBLEMS (pain or burning when urinating, or frequent urination) *BOWEL PROBLEMS (unusual diarrhea, constipation, pain near the anus) TENDERNESS IN MOUTH AND THROAT WITH OR WITHOUT PRESENCE OF ULCERS (sore throat, sores in mouth, or a toothache) UNUSUAL RASH, SWELLING OR PAIN  UNUSUAL VAGINAL DISCHARGE OR ITCHING   Items with * indicate a potential emergency and should be  followed up as soon as possible or go to the Emergency Department if any problems should occur.  Please show the CHEMOTHERAPY ALERT CARD or IMMUNOTHERAPY ALERT CARD at check-in to the Emergency Department and triage nurse.  Should you have questions after your visit or need to cancel or reschedule your appointment, please contact [facility] [place] - A DEPT OF [facility] [phone]  and follow the prompts.  Office hours are 8:00 a.m. to 4:30 p.m. Monday - Friday. Please note that voicemails left after 4:00 p.m. may not be returned until the following business day.  We are closed weekends and major holidays. You have access to a nurse at all times for urgent questions. Please call the main number to the clinic [phone] and follow the prompts.  For any non-urgent questions, you may also contact your provider using MyChart. We now offer e-Visits for anyone 50 and older to request care online for non-urgent symptoms. For details visit [email or web address].   Also download the MyChart app! Go to the app store, search MyChart, open the app, select [place], and log in with your MyChart username and password.

## 2024-07-23 NOTE — Progress Notes (Signed)
 Patient presents today for chemotherapy Kyprolis /Cytoxan  infusion.  Patient is in satisfactory condition with no new complaints voiced.  Vital signs are stable.  Labs reviewed and all labs are within treatment parameters.  We will proceed with treatment per MD orders.    Treatment given today per MD orders. Tolerated infusion without adverse affects. Vital signs stable. No complaints at this time. Discharged from clinic via wheelchair in stable condition. Alert and oriented x 3. F/U with [facility] as scheduled.

## 2024-07-30 ENCOUNTER — Inpatient Hospital Stay

## 2024-07-30 VITALS — BP 124/66 | HR 83 | Temp 97.8°F | Resp 18

## 2024-07-30 DIAGNOSIS — C9000 Multiple myeloma not having achieved remission: Secondary | ICD-10-CM

## 2024-07-30 DIAGNOSIS — Z5112 Encounter for antineoplastic immunotherapy: Secondary | ICD-10-CM | POA: Diagnosis not present

## 2024-07-30 LAB — CBC WITH DIFFERENTIAL/PLATELET
Abs Immature Granulocytes: 0.02 K/uL (ref 0.00–0.07)
Basophils Absolute: 0 K/uL (ref 0.0–0.1)
Basophils Relative: 0 %
Eosinophils Absolute: 0.1 K/uL (ref 0.0–0.5)
Eosinophils Relative: 4 %
HCT: 27.3 % — ABNORMAL LOW (ref 36.0–46.0)
Hemoglobin: 8.3 g/dL — ABNORMAL LOW (ref 12.0–15.0)
Immature Granulocytes: 1 %
Lymphocytes Relative: 10 %
Lymphs Abs: 0.4 K/uL — ABNORMAL LOW (ref 0.7–4.0)
MCH: 30.5 pg (ref 26.0–34.0)
MCHC: 30.4 g/dL (ref 30.0–36.0)
MCV: 100.4 fL — ABNORMAL HIGH (ref 80.0–100.0)
Monocytes Absolute: 0.5 K/uL (ref 0.1–1.0)
Monocytes Relative: 14 %
Neutro Abs: 2.7 K/uL (ref 1.7–7.7)
Neutrophils Relative %: 71 %
Platelets: 128 K/uL — ABNORMAL LOW (ref 150–400)
RBC: 2.72 MIL/uL — ABNORMAL LOW (ref 3.87–5.11)
RDW: 14.7 % (ref 11.5–15.5)
WBC: 3.7 K/uL — ABNORMAL LOW (ref 4.0–10.5)
nRBC: 0 % (ref 0.0–0.2)

## 2024-07-30 LAB — COMPREHENSIVE METABOLIC PANEL WITH GFR
ALT: 29 U/L (ref 0–44)
AST: 18 U/L (ref 15–41)
Albumin: 4.1 g/dL (ref 3.5–5.0)
Alkaline Phosphatase: 123 U/L (ref 38–126)
Anion gap: 15 (ref 5–15)
BUN: 22 mg/dL (ref 8–23)
CO2: 23 mmol/L (ref 22–32)
Calcium: 9.5 mg/dL (ref 8.9–10.3)
Chloride: 100 mmol/L (ref 98–111)
Creatinine, Ser: 1.52 mg/dL — ABNORMAL HIGH (ref 0.44–1.00)
GFR, Estimated: 35 mL/min — ABNORMAL LOW (ref >60)
Glucose, Bld: 173 mg/dL — ABNORMAL HIGH (ref 70–99)
Potassium: 4.7 mmol/L (ref 3.5–5.1)
Sodium: 138 mmol/L (ref 135–145)
Total Bilirubin: 0.4 mg/dL (ref 0.0–1.2)
Total Protein: 6.8 g/dL (ref 6.5–8.1)

## 2024-07-30 LAB — MAGNESIUM: Magnesium: 2 mg/dL (ref 1.7–2.4)

## 2024-07-30 MED ORDER — PALONOSETRON HCL INJECTION 0.25 MG/5ML
0.2500 mg | Freq: Once | INTRAVENOUS | Status: AC
  Administered 2024-07-30: 0.25 mg via INTRAVENOUS
  Filled 2024-07-30: qty 5

## 2024-07-30 MED ORDER — DEXTROSE 5 % IV SOLN
58.0000 mg/m2 | Freq: Once | INTRAVENOUS | Status: AC
  Administered 2024-07-30: 120 mg via INTRAVENOUS
  Filled 2024-07-30: qty 60

## 2024-07-30 MED ORDER — SODIUM CHLORIDE 0.9 % IV SOLN
20.0000 mg | Freq: Once | INTRAVENOUS | Status: AC
  Administered 2024-07-30: 20 mg via INTRAVENOUS
  Filled 2024-07-30: qty 20

## 2024-07-30 MED ORDER — ACETAMINOPHEN 325 MG PO TABS
650.0000 mg | ORAL_TABLET | Freq: Once | ORAL | Status: AC
  Administered 2024-07-30: 650 mg via ORAL
  Filled 2024-07-30: qty 2

## 2024-07-30 MED ADMIN — CYCLOPHOSPHAMIDE CHEMO IV INFUSION < 2 GM: 600 mg | INTRAVENOUS | NDC 25021024951

## 2024-07-30 MED FILL — Cyclophosphamide For Inj 2 GM: 300.0000 mg/m2 | INTRAMUSCULAR | Qty: 30 | Status: AC

## 2024-07-30 NOTE — Patient Instructions (Signed)
 [facility] [place] - A DEPT OF [place]. [facility]  Discharge Instructions: Thank you for choosing [facility] to provide your oncology and hematology care.  If you have a lab appointment with the Cancer Center - please note that after April 8th, 2024, all labs will be drawn in the cancer center.  You do not have to check in or register with the main entrance as you have in the past but will complete your check-in in the cancer center.  Wear comfortable clothing and clothing appropriate for easy access to any Portacath or PICC line.   We strive to give you quality time with your provider. You may need to reschedule your appointment if you arrive late (15 or more minutes).  Arriving late affects you and other patients whose appointments are after yours.  Also, if you miss three or more appointments without notifying the office, you may be dismissed from the clinic at the providers discretion.      For prescription refill requests, have your pharmacy contact our office and allow 72 hours for refills to be completed.    Today you received the following chemotherapy and/or immunotherapy agents kyprolis  and cytoxan       To help prevent nausea and vomiting after your treatment, we encourage you to take your nausea medication as directed.  BELOW ARE SYMPTOMS THAT SHOULD BE REPORTED IMMEDIATELY: *FEVER GREATER THAN 100.4 F (38 C) OR HIGHER *CHILLS OR SWEATING *NAUSEA AND VOMITING THAT IS NOT CONTROLLED WITH YOUR NAUSEA MEDICATION *UNUSUAL SHORTNESS OF BREATH *UNUSUAL BRUISING OR BLEEDING *URINARY PROBLEMS (pain or burning when urinating, or frequent urination) *BOWEL PROBLEMS (unusual diarrhea, constipation, pain near the anus) TENDERNESS IN MOUTH AND THROAT WITH OR WITHOUT PRESENCE OF ULCERS (sore throat, sores in mouth, or a toothache) UNUSUAL RASH, SWELLING OR PAIN  UNUSUAL VAGINAL DISCHARGE OR ITCHING   Items with * indicate a potential emergency and should be  followed up as soon as possible or go to the Emergency Department if any problems should occur.  Please show the CHEMOTHERAPY ALERT CARD or IMMUNOTHERAPY ALERT CARD at check-in to the Emergency Department and triage nurse.  Should you have questions after your visit or need to cancel or reschedule your appointment, please contact [facility] [place] - A DEPT OF [facility] [phone]  and follow the prompts.  Office hours are 8:00 a.m. to 4:30 p.m. Monday - Friday. Please note that voicemails left after 4:00 p.m. may not be returned until the following business day.  We are closed weekends and major holidays. You have access to a nurse at all times for urgent questions. Please call the main number to the clinic [phone] and follow the prompts.  For any non-urgent questions, you may also contact your provider using MyChart. We now offer e-Visits for anyone 88 and older to request care online for non-urgent symptoms. For details visit [email or web address].   Also download the MyChart app! Go to the app store, search MyChart, open the app, select [place], and log in with your MyChart username and password.

## 2024-07-30 NOTE — Progress Notes (Signed)
 Patient presents today for Cytoxan  and Kyprolis . Vital signs and lab work in parameters for treatment. Creatinine 1.52. Treatment conditions on tx plan states creatinine > 2.

## 2024-07-30 NOTE — Progress Notes (Signed)

## 2024-08-01 ENCOUNTER — Other Ambulatory Visit: Payer: Self-pay | Admitting: Oncology

## 2024-08-03 ENCOUNTER — Encounter: Payer: Self-pay | Admitting: Oncology

## 2024-08-06 ENCOUNTER — Inpatient Hospital Stay

## 2024-08-06 ENCOUNTER — Other Ambulatory Visit: Payer: Self-pay | Admitting: *Deleted

## 2024-08-06 VITALS — BP 148/68 | HR 89 | Temp 96.4°F | Resp 20

## 2024-08-06 DIAGNOSIS — C9000 Multiple myeloma not having achieved remission: Secondary | ICD-10-CM

## 2024-08-06 DIAGNOSIS — Z5112 Encounter for antineoplastic immunotherapy: Secondary | ICD-10-CM | POA: Diagnosis not present

## 2024-08-06 LAB — CBC WITH DIFFERENTIAL/PLATELET
Abs Immature Granulocytes: 0.03 K/uL (ref 0.00–0.07)
Basophils Absolute: 0 K/uL (ref 0.0–0.1)
Basophils Relative: 0 %
Eosinophils Absolute: 0.1 K/uL (ref 0.0–0.5)
Eosinophils Relative: 2 %
HCT: 25.3 % — ABNORMAL LOW (ref 36.0–46.0)
Hemoglobin: 7.9 g/dL — ABNORMAL LOW (ref 12.0–15.0)
Immature Granulocytes: 1 %
Lymphocytes Relative: 10 %
Lymphs Abs: 0.3 K/uL — ABNORMAL LOW (ref 0.7–4.0)
MCH: 31.1 pg (ref 26.0–34.0)
MCHC: 31.2 g/dL (ref 30.0–36.0)
MCV: 99.6 fL (ref 80.0–100.0)
Monocytes Absolute: 0.4 K/uL (ref 0.1–1.0)
Monocytes Relative: 10 %
Neutro Abs: 2.7 K/uL (ref 1.7–7.7)
Neutrophils Relative %: 77 %
Platelets: 131 K/uL — ABNORMAL LOW (ref 150–400)
RBC: 2.54 MIL/uL — ABNORMAL LOW (ref 3.87–5.11)
RDW: 15.8 % — ABNORMAL HIGH (ref 11.5–15.5)
WBC: 3.5 K/uL — ABNORMAL LOW (ref 4.0–10.5)
nRBC: 0 % (ref 0.0–0.2)

## 2024-08-06 LAB — COMPREHENSIVE METABOLIC PANEL WITH GFR
ALT: 30 U/L (ref 0–44)
AST: 18 U/L (ref 15–41)
Albumin: 4.1 g/dL (ref 3.5–5.0)
Alkaline Phosphatase: 158 U/L — ABNORMAL HIGH (ref 38–126)
Anion gap: 13 (ref 5–15)
BUN: 29 mg/dL — ABNORMAL HIGH (ref 8–23)
CO2: 23 mmol/L (ref 22–32)
Calcium: 8.9 mg/dL (ref 8.9–10.3)
Chloride: 99 mmol/L (ref 98–111)
Creatinine, Ser: 1.68 mg/dL — ABNORMAL HIGH (ref 0.44–1.00)
GFR, Estimated: 31 mL/min — ABNORMAL LOW (ref >60)
Glucose, Bld: 317 mg/dL — ABNORMAL HIGH (ref 70–99)
Potassium: 4.7 mmol/L (ref 3.5–5.1)
Sodium: 136 mmol/L (ref 135–145)
Total Bilirubin: 0.3 mg/dL (ref 0.0–1.2)
Total Protein: 6.7 g/dL (ref 6.5–8.1)

## 2024-08-06 MED ORDER — SODIUM CHLORIDE 0.9 % IV SOLN: Freq: Once | INTRAVENOUS | Status: AC

## 2024-08-06 MED ORDER — ACETAMINOPHEN 325 MG PO TABS
650.0000 mg | ORAL_TABLET | Freq: Once | ORAL | Status: AC
  Administered 2024-08-06: 14:00:00 650 mg via ORAL
  Filled 2024-08-06: qty 2

## 2024-08-06 MED ORDER — SODIUM CHLORIDE 0.9 % IV SOLN: INTRAVENOUS | Status: DC

## 2024-08-06 MED ORDER — SODIUM CHLORIDE 0.9 % IV SOLN
20.0000 mg | Freq: Once | INTRAVENOUS | Status: AC
  Administered 2024-08-06: 14:00:00 20 mg via INTRAVENOUS
  Filled 2024-08-06: qty 20

## 2024-08-06 MED ORDER — DEXTROSE 5 % IV SOLN
58.0000 mg/m2 | Freq: Once | INTRAVENOUS | Status: AC
  Administered 2024-08-06: 15:00:00 120 mg via INTRAVENOUS
  Filled 2024-08-06: qty 60

## 2024-08-06 MED ORDER — SODIUM CHLORIDE 0.9 % IV SOLN
300.0000 mg/m2 | Freq: Once | INTRAVENOUS | Status: AC
  Administered 2024-08-06: 15:00:00 600 mg via INTRAVENOUS
  Filled 2024-08-06: qty 30

## 2024-08-06 MED ORDER — PALONOSETRON HCL INJECTION 0.25 MG/5ML
0.2500 mg | Freq: Once | INTRAVENOUS | Status: AC
  Administered 2024-08-06: 14:00:00 0.25 mg via INTRAVENOUS
  Filled 2024-08-06: qty 5

## 2024-08-06 NOTE — Progress Notes (Signed)
 Patient presents today for Kaprolis/Cytoxan  infusion per providers order.  Vital signs within parameters for treatment.  Hgb noted to be 7.9, patient asymptomatic, MD notified.  No transfusion needed at this time per [facility].  Treatment given today per MD orders.  Stable during infusion without adverse affects.  Vital signs stable.  No complaints at this time.  Discharge from clinic ambulatory in stable condition.  Alert and oriented X 3.  Follow up with [facility] as scheduled.

## 2024-08-06 NOTE — Patient Instructions (Signed)
 [facility] [place] - A DEPT OF [place]  Discharge Instructions: Thank you for choosing [facility] to provide your oncology and hematology care.  If you have a lab appointment with the Cancer Center - please note that after April 8th, 2024, all labs will be drawn in the cancer center.  You do not have to check in or register with the main entrance as you have in the past but will complete your check-in in the cancer center.  Wear comfortable clothing and clothing appropriate for easy access to any Portacath or PICC line.   We strive to give you quality time with your provider. You may need to reschedule your appointment if you arrive late (15 or more minutes).  Arriving late affects you and other patients whose appointments are after yours.  Also, if you miss three or more appointments without notifying the office, you may be dismissed from the clinic at the provider's discretion.      For prescription refill requests, have your pharmacy contact our office and allow 72 hours for refills to be completed.    Today you received the following chemotherapy and/or immunotherapy agents Kyprolis /Cytoxan        To help prevent nausea and vomiting after your treatment, we encourage you to take your nausea medication as directed.  BELOW ARE SYMPTOMS THAT SHOULD BE REPORTED IMMEDIATELY: *FEVER GREATER THAN 100.4 F (38 C) OR HIGHER *CHILLS OR SWEATING *NAUSEA AND VOMITING THAT IS NOT CONTROLLED WITH YOUR NAUSEA MEDICATION *UNUSUAL SHORTNESS OF BREATH *UNUSUAL BRUISING OR BLEEDING *URINARY PROBLEMS (pain or burning when urinating, or frequent urination) *BOWEL PROBLEMS (unusual diarrhea, constipation, pain near the anus) TENDERNESS IN MOUTH AND THROAT WITH OR WITHOUT PRESENCE OF ULCERS (sore throat, sores in mouth, or a toothache) UNUSUAL RASH, SWELLING OR PAIN  UNUSUAL VAGINAL DISCHARGE OR ITCHING   Items with * indicate a potential emergency and should be  followed up as soon as possible or go to the Emergency Department if any problems should occur.  Please show the CHEMOTHERAPY ALERT CARD or IMMUNOTHERAPY ALERT CARD at check-in to the Emergency Department and triage nurse.  Should you have questions after your visit or need to cancel or reschedule your appointment, please contact [facility] [place] - A DEPT OF [facility] [phone]  and follow the prompts.  Office hours are 8:00 a.m. to 4:30 p.m. Monday - Friday. Please note that voicemails left after 4:00 p.m. may not be returned until the following business day.  We are closed weekends and major holidays. You have access to a nurse at all times for urgent questions. Please call the main number to the clinic [phone] and follow the prompts.  For any non-urgent questions, you may also contact your provider using MyChart. We now offer e-Visits for anyone 50 and older to request care online for non-urgent symptoms. For details visit [email or web address].   Also download the MyChart app! Go to the app store, search MyChart, open the app, select [place], and log in with your MyChart username and password.

## 2024-08-07 MED ORDER — OXYCODONE HCL 5 MG PO TABS: 10.0000 mg | ORAL_TABLET | ORAL | 0 refills | Status: DC | PRN

## 2024-08-07 MED ORDER — GLIPIZIDE 5 MG PO TABS: 5.0000 mg | ORAL_TABLET | Freq: Every day | ORAL | 0 refills | Status: AC

## 2024-08-11 ENCOUNTER — Ambulatory Visit: Payer: Self-pay | Admitting: Internal Medicine

## 2024-08-12 NOTE — Progress Notes (Signed)
 NIC'd colonoscopy for 5 years

## 2024-08-17 ENCOUNTER — Encounter: Payer: Self-pay | Admitting: *Deleted

## 2024-08-22 ENCOUNTER — Other Ambulatory Visit: Payer: Self-pay | Admitting: Oncology

## 2024-08-24 ENCOUNTER — Encounter: Payer: Self-pay | Admitting: Oncology

## 2024-08-31 ENCOUNTER — Other Ambulatory Visit: Payer: Self-pay | Admitting: Oncology

## 2024-08-31 ENCOUNTER — Ambulatory Visit: Admitting: Neurology

## 2024-08-31 NOTE — Telephone Encounter (Signed)
 Alexandra Price

## 2024-09-01 ENCOUNTER — Encounter: Payer: Self-pay | Admitting: Oncology

## 2024-09-05 ENCOUNTER — Other Ambulatory Visit: Payer: Self-pay

## 2024-09-21 ENCOUNTER — Other Ambulatory Visit: Payer: Self-pay

## 2024-09-21 DIAGNOSIS — G479 Sleep disorder, unspecified: Secondary | ICD-10-CM

## 2024-09-21 DIAGNOSIS — F419 Anxiety disorder, unspecified: Secondary | ICD-10-CM

## 2024-09-22 ENCOUNTER — Encounter: Payer: Self-pay | Admitting: Oncology

## 2024-09-22 MED ORDER — MIDODRINE HCL 2.5 MG PO TABS: 2.5000 mg | ORAL_TABLET | Freq: Three times a day (TID) | ORAL | 0 refills | Status: AC

## 2024-09-22 MED ORDER — ZOLPIDEM TARTRATE 10 MG PO TABS: 10.0000 mg | ORAL_TABLET | Freq: Every evening | ORAL | 5 refills | Status: AC | PRN

## 2024-09-22 MED ORDER — OXYCODONE HCL 5 MG PO TABS: 10.0000 mg | ORAL_TABLET | ORAL | 0 refills | Status: AC | PRN

## 2024-11-09 ENCOUNTER — Inpatient Hospital Stay: Admitting: Oncology

## 2024-11-09 ENCOUNTER — Inpatient Hospital Stay
